# Patient Record
Sex: Female | Born: 1950 | Race: White | Hispanic: No | Smoking: Former smoker
Health system: Southern US, Community
[De-identification: ages and names within clinical notes are randomized; demographics above are authoritative.]

## PROBLEM LIST (undated history)

## (undated) DIAGNOSIS — C801 Malignant (primary) neoplasm, unspecified: Secondary | ICD-10-CM

## (undated) DIAGNOSIS — F329 Major depressive disorder, single episode, unspecified: Secondary | ICD-10-CM

## (undated) DIAGNOSIS — R000 Tachycardia, unspecified: Secondary | ICD-10-CM

## (undated) DIAGNOSIS — Z923 Personal history of irradiation: Secondary | ICD-10-CM

## (undated) DIAGNOSIS — E039 Hypothyroidism, unspecified: Secondary | ICD-10-CM

## (undated) DIAGNOSIS — E079 Disorder of thyroid, unspecified: Secondary | ICD-10-CM

## (undated) DIAGNOSIS — F419 Anxiety disorder, unspecified: Secondary | ICD-10-CM

## (undated) DIAGNOSIS — F32A Depression, unspecified: Secondary | ICD-10-CM

## (undated) DIAGNOSIS — Z9221 Personal history of antineoplastic chemotherapy: Secondary | ICD-10-CM

## (undated) DIAGNOSIS — I10 Essential (primary) hypertension: Secondary | ICD-10-CM

## (undated) HISTORY — PX: TONSILLECTOMY: SUR1361

## (undated) HISTORY — PX: BREAST LUMPECTOMY: SHX2

## (undated) HISTORY — DX: Disorder of thyroid, unspecified: E07.9

## (undated) HISTORY — PX: COLONOSCOPY: SHX174

---
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
## 1999-01-16 HISTORY — PX: BREAST EXCISIONAL BIOPSY: SUR124

## 2003-12-21 ENCOUNTER — Ambulatory Visit: Payer: Self-pay | Admitting: General Surgery

## 2004-02-22 ENCOUNTER — Emergency Department: Payer: Self-pay | Admitting: Emergency Medicine

## 2004-02-22 ENCOUNTER — Other Ambulatory Visit: Payer: Self-pay

## 2004-03-03 ENCOUNTER — Ambulatory Visit: Payer: Self-pay | Admitting: Internal Medicine

## 2006-04-16 ENCOUNTER — Other Ambulatory Visit: Payer: Self-pay

## 2006-04-16 ENCOUNTER — Emergency Department: Payer: Self-pay | Admitting: Emergency Medicine

## 2009-01-21 ENCOUNTER — Ambulatory Visit: Payer: Self-pay | Admitting: General Surgery

## 2010-04-10 ENCOUNTER — Emergency Department: Payer: Self-pay | Admitting: Emergency Medicine

## 2010-12-22 ENCOUNTER — Emergency Department: Payer: Self-pay | Admitting: Emergency Medicine

## 2010-12-22 ENCOUNTER — Ambulatory Visit: Payer: Self-pay

## 2011-02-11 ENCOUNTER — Emergency Department: Payer: Self-pay | Admitting: Unknown Physician Specialty

## 2011-02-11 LAB — COMPREHENSIVE METABOLIC PANEL
Albumin: 4.1 g/dL (ref 3.4–5.0)
Alkaline Phosphatase: 122 U/L (ref 50–136)
Anion Gap: 11 (ref 7–16)
BUN: 14 mg/dL (ref 7–18)
Bilirubin,Total: 0.5 mg/dL (ref 0.2–1.0)
Calcium, Total: 9 mg/dL (ref 8.5–10.1)
Chloride: 107 mmol/L (ref 98–107)
Co2: 24 mmol/L (ref 21–32)
Creatinine: 0.65 mg/dL (ref 0.60–1.30)
EGFR (African American): >60
EGFR (Non-African Amer.): >60
Glucose: 98 mg/dL (ref 65–99)
Osmolality: 284 (ref 275–301)
Potassium: 3.8 mmol/L (ref 3.5–5.1)
SGOT(AST): 27 U/L (ref 15–37)
SGPT (ALT): 36 U/L
Sodium: 142 mmol/L (ref 136–145)
Total Protein: 7 g/dL (ref 6.4–8.2)

## 2011-02-11 LAB — CK TOTAL AND CKMB (NOT AT ARMC)
CK, Total: 94 U/L (ref 21–215)
CK-MB: 1 ng/mL (ref 0.5–3.6)

## 2011-02-11 LAB — APTT: Activated PTT: 34.9 secs (ref 23.6–35.9)

## 2011-02-11 LAB — CBC
HCT: 40.9 % (ref 35.0–47.0)
HGB: 14.2 g/dL (ref 12.0–16.0)
MCH: 30.8 pg (ref 26.0–34.0)
MCHC: 34.7 g/dL (ref 32.0–36.0)
MCV: 89 fL (ref 80–100)
Platelet: 151 10*3/uL (ref 150–440)
RBC: 4.6 10*6/uL (ref 3.80–5.20)
RDW: 13.2 % (ref 11.5–14.5)
WBC: 6.3 10*3/uL (ref 3.6–11.0)

## 2011-02-11 LAB — PROTIME-INR
INR: 0.9
Prothrombin Time: 12.7 secs (ref 11.5–14.7)

## 2011-02-11 LAB — TROPONIN I: Troponin-I: <0.02 ng/mL

## 2015-01-11 ENCOUNTER — Emergency Department
Admission: EM | Admit: 2015-01-11 | Discharge: 2015-01-11 | Disposition: A | Discharge status: Home / Self Care | Payer: Medicare Other | Attending: Emergency Medicine | Admitting: Emergency Medicine

## 2015-01-11 DIAGNOSIS — L02212 Cutaneous abscess of back [any part, except buttock]: Secondary | ICD-10-CM | POA: Insufficient documentation

## 2015-01-11 DIAGNOSIS — R030 Elevated blood-pressure reading, without diagnosis of hypertension: Secondary | ICD-10-CM | POA: Diagnosis not present

## 2015-01-11 DIAGNOSIS — F17200 Nicotine dependence, unspecified, uncomplicated: Secondary | ICD-10-CM | POA: Diagnosis not present

## 2015-01-11 MED ORDER — HYDROCODONE-ACETAMINOPHEN 5-325 MG PO TABS: 1.0000 | ORAL_TABLET | ORAL | Status: DC | PRN

## 2015-01-11 MED ORDER — LIDOCAINE-EPINEPHRINE (PF) 1 %-1:200000 IJ SOLN
INTRAMUSCULAR | Status: AC
  Administered 2015-01-11: 20:00:00
  Filled 2015-01-11: qty 30

## 2015-01-11 MED ORDER — SULFAMETHOXAZOLE-TRIMETHOPRIM 800-160 MG PO TABS: 1.0000 | ORAL_TABLET | Freq: Two times a day (BID) | ORAL | Status: DC

## 2015-01-11 MED ORDER — LIDOCAINE-EPINEPHRINE (PF) 2 %-1:200000 IJ SOLN: 10.0000 mL | Freq: Once | INTRAMUSCULAR | Status: DC

## 2015-01-11 NOTE — Discharge Instructions (Signed)
Abscess °An abscess is an infected area that contains a collection of pus and debris. It can occur in almost any part of the body. An abscess is also known as a furuncle or boil. °CAUSES  °An abscess occurs when tissue gets infected. This can occur from blockage of oil or sweat glands, infection of hair follicles, or a minor injury to the skin. As the body tries to fight the infection, pus collects in the area and creates pressure under the skin. This pressure causes pain. People with weakened immune systems have difficulty fighting infections and get certain abscesses more often.  °SYMPTOMS °Usually an abscess develops on the skin and becomes a painful mass that is red, warm, and tender. If the abscess forms under the skin, you may feel a moveable soft area under the skin. Some abscesses break open (rupture) on their own, but most will continue to get worse without care. The infection can spread deeper into the body and eventually into the bloodstream, causing you to feel ill.  °DIAGNOSIS  °Your caregiver will take your medical history and perform a physical exam. A sample of fluid may also be taken from the abscess to determine what is causing your infection. °TREATMENT  °Your caregiver may prescribe antibiotic medicines to fight the infection. However, taking antibiotics alone usually does not cure an abscess. Your caregiver may need to make a small cut (incision) in the abscess to drain the pus. In some cases, gauze is packed into the abscess to reduce pain and to continue draining the area. °HOME CARE INSTRUCTIONS  °· Only take over-the-counter or prescription medicines for pain, discomfort, or fever as directed by your caregiver. °· If you were prescribed antibiotics, take them as directed. Finish them even if you start to feel better. °· If gauze is used, follow your caregiver's directions for changing the gauze. °· To avoid spreading the infection: °· Keep your draining abscess covered with a  bandage. °· Wash your hands well. °· Do not share personal care items, towels, or whirlpools with others. °· Avoid skin contact with others. °· Keep your skin and clothes clean around the abscess. °· Keep all follow-up appointments as directed by your caregiver. °SEEK MEDICAL CARE IF:  °· You have increased pain, swelling, redness, fluid drainage, or bleeding. °· You have muscle aches, chills, or a general ill feeling. °· You have a fever. °MAKE SURE YOU:  °· Understand these instructions. °· Will watch your condition. °· Will get help right away if you are not doing well or get worse. °  °This information is not intended to replace advice given to you by your health care provider. Make sure you discuss any questions you have with your health care provider. °  °Document Released: 10/11/2004 Document Revised: 07/03/2011 Document Reviewed: 03/16/2011 °Elsevier Interactive Patient Education ©2016 [employer] ° °Incision and Drainage °Incision and drainage is a procedure in which a sac-like structure (cystic structure) is opened and drained. The area to be drained usually contains material such as pus, fluid, or blood.  °LET YOUR CAREGIVER KNOW ABOUT:  °· Allergies to medicine. °· Medicines taken, including vitamins, herbs, eyedrops, over-the-counter medicines, and creams. °· Use of steroids (by mouth or creams). °· Previous problems with anesthetics or numbing medicines. °· History of bleeding problems or blood clots. °· Previous surgery. °· Other health problems, including diabetes and kidney problems. °· Possibility of pregnancy, if this applies. °RISKS AND COMPLICATIONS °· Pain. °· Bleeding. °· Scarring. °· Infection. °BEFORE THE PROCEDURE  °  You may need to have an ultrasound or other imaging tests to see how large or deep your cystic structure is. Blood tests may also be used to determine if you have an infection or how severe the infection is. You may need to have a tetanus shot. °PROCEDURE  °The affected area  is cleaned with a cleaning fluid. The cyst area will then be numbed with a medicine (local anesthetic). A small incision will be made in the cystic structure. A syringe or catheter may be used to drain the contents of the cystic structure, or the contents may be squeezed out. The area will then be flushed with a cleansing solution. After cleansing the area, it is often gently packed with a gauze or another wound dressing. Once it is packed, it will be covered with gauze and tape or some other type of wound dressing.  °AFTER THE PROCEDURE  °· Often, you will be allowed to go home right after the procedure. °· You may be given antibiotic medicine to prevent or heal an infection. °· If the area was packed with gauze or some other wound dressing, you will likely need to come back in 1 to 2 days to get it removed. °· The area should heal in about 14 days. °  °This information is not intended to replace advice given to you by your health care provider. Make sure you discuss any questions you have with your health care provider. °  °Document Released: 06/27/2000 Document Revised: 07/03/2011 Document Reviewed: 02/26/2011 °Elsevier Interactive Patient Education ©2016 [employer] ° °

## 2015-01-11 NOTE — ED Notes (Signed)
Pt states she went to [facility] for treatment of an abscess to the right upper back for the past week, drainage for the past 2 days.. Pt states they would not treat her there due to elevated b/p .Marland Kitchen Denies hx of HTN,, denies any neuro sx on arrival

## 2015-01-11 NOTE — ED Provider Notes (Signed)
[facility] Emergency Department Provider Note  ____________________________________________  Time seen: Approximately 7:40 PM  I have reviewed the triage vital signs and the nursing notes.   HISTORY  Chief Complaint Abscess    HPI Alicia [name] is a 64 y.o. female who presents to the emergency department for an abscess to her right upper back. She states that she noticed swelling and redness to area approximately a week ago. She does endorse some drainage from the area over the last 2 days. Patient initially presented to an urgent care to the emergency department due to an elevated blood pressure reading. Patient denies any history of blood pressure or heart problems. She denies any headache, visual acuity changes, difficulty breathing, chest pain, abdominal pain, nausea or vomiting.   History reviewed. No pertinent past medical history.  There are no active problems to display for this patient.   Past Surgical History  Procedure Laterality Date  . Tonsillectomy      Current Outpatient Rx  Name  Route  Sig  Dispense  Refill  . HYDROcodone-acetaminophen (NORCO/VICODIN) 5-325 MG tablet   Oral   Take 1 tablet by mouth every 4 (four) hours as needed for moderate pain.   20 tablet   0   . sulfamethoxazole-trimethoprim (BACTRIM DS,SEPTRA DS) 800-160 MG tablet   Oral   Take 1 tablet by mouth 2 (two) times daily.   20 tablet   0     Allergies Review of patient's allergies indicates no known allergies.  No family history on file.  Social History Social History  Substance Use Topics  . Smoking status: Current Some Day Smoker  . Smokeless tobacco: None  . Alcohol Use: No    Review of Systems Constitutional: No fever/chills Eyes: No visual changes. ENT: No sore throat. Cardiovascular: Denies chest pain. Respiratory: Denies shortness of breath. Gastrointestinal: No abdominal pain.  No nausea, no vomiting.  No diarrhea.  No  constipation. Genitourinary: Negative for dysuria. Musculoskeletal: Negative for back pain. Skin: Negative for rash. Endorses abscess to right upper back. Neurological: Negative for headaches, focal weakness or numbness.  10-point ROS otherwise negative.  ____________________________________________   PHYSICAL EXAM:  VITAL SIGNS: ED Triage Vitals  Enc Vitals Group     BP 01/11/15 1759 188/84 mmHg     Pulse Rate 01/11/15 1759 87     Resp 01/11/15 1759 18     Temp 01/11/15 1759 97.7 F (36.5 C)     Temp Source 01/11/15 1759 Oral     SpO2 01/11/15 1759 98 %     Weight 01/11/15 1759 215 lb (97.523 kg)     Height 01/11/15 1759 5\' 6"  (1.676 m)     Head Cir --      Peak Flow --      Pain Score 01/11/15 1800 4     Pain Loc --      Pain Edu? --      Excl. in [place]? --     Constitutional: Alert and oriented. Well appearing and in no acute distress. Eyes: Conjunctivae are normal. PERRL. EOMI. Head: Atraumatic. Nose: No congestion/rhinnorhea. Mouth/Throat: Mucous membranes are moist.  Oropharynx non-erythematous. Neck: No stridor.   Cardiovascular: Normal rate, regular rhythm. Grossly normal heart sounds.  Good peripheral circulation. Respiratory: Normal respiratory effort.  No retractions. Lungs CTAB. Gastrointestinal: Soft and nontender. No distention. No abdominal bruits. No CVA tenderness. Musculoskeletal: No lower extremity tenderness nor edema.  No joint effusions. Neurologic:  Normal speech and language. No gross  focal neurologic deficits are appreciated. No gait instability. Skin:  Skin is warm, dry and intact. No rash noted. Erythematous and edematous lesion is noted to the right upper back over the scapula. Abscess does have some minor central erosion with purulent drainage. Area is very tender to palpation. Diameter of the abscess is approximately 5 cm. Area is fluctuant to palpation. There is tender to palpation. Psychiatric: Mood and affect are normal. Speech and behavior  are normal.  ____________________________________________   LABS (all labs ordered are listed, but only abnormal results are displayed)  Labs Reviewed - No data to display ____________________________________________  EKG   ____________________________________________  RADIOLOGY   ____________________________________________   PROCEDURES  Procedure(s) performed: yes, absces, see procedure note(s).   INCISION AND DRAINAGE Performed by: Charline Bills Nehal Witting Consent: Verbal consent obtained. Risks and benefits: risks, benefits and alternatives were discussed Type: abscess  Body area: Right upper back  Anesthesia: local infiltration  Incision was made with a scalpel.  Local anesthetic: lidocaine 1 % with epinephrine  Anesthetic total: 5 ml  Complexity: complex Blunt dissection to break up loculations  Drainage: purulent  Drainage amount: 7 mL   Packing material: 1/4 in iodoform gauze  Patient tolerance: Patient tolerated the procedure well with no immediate complications.     Critical Care performed: No  ____________________________________________   INITIAL IMPRESSION / ASSESSMENT AND PLAN / ED COURSE  Pertinent labs & imaging results that were available during my care of the patient were reviewed by me and considered in my medical decision making (see chart for details).  Patient presents to the emergency department from urgent care for abscess to right upper back. Urgent care sent patient to the emergency department due to elevated blood pressure. Patient is exhibiting no signs of ICP, CVA, or cardiac issues. Patient does have a history of anxiety and endorses being anxious. Patient does endorse significant amount of pain to area. Elevated blood pressure is most likely due to his underlying factors. Area is incised and drained in the emergency department with significant purulent drainage. Wound is packed using quarter-inch iodoform gauze. Patient is  advised to follow-up in 2 days with primary care, urgent care, or this department for wound recheck. Patient will be placed on Bactrim for 10 days for antibiotic coverage. Patient is also given prescription for narcotics for pain control. Patient verbalizes understanding of diagnosis and treatment plan and verbalizes compliance with same.   Discharge Medication List as of 01/11/2015  8:22 PM    START taking these medications   Details  HYDROcodone-acetaminophen (NORCO/VICODIN) 5-325 MG tablet Take 1 tablet by mouth every 4 (four) hours as needed for moderate pain., Starting 01/11/2015, Until Discontinued, Print    sulfamethoxazole-trimethoprim (BACTRIM DS,SEPTRA DS) 800-160 MG tablet Take 1 tablet by mouth 2 (two) times daily., Starting 01/11/2015, Until Discontinued, Print        ____________________________________________   FINAL CLINICAL IMPRESSION(S) / ED DIAGNOSES  Final diagnoses:  Abscess of back      Darletta Moll, PA-C 01/11/15 2035  Hinda Kehr, MD 01/11/15 2111

## 2015-01-13 DIAGNOSIS — F410 Panic disorder [episodic paroxysmal anxiety] without agoraphobia: Secondary | ICD-10-CM | POA: Diagnosis not present

## 2015-01-13 DIAGNOSIS — L02212 Cutaneous abscess of back [any part, except buttock]: Secondary | ICD-10-CM | POA: Diagnosis not present

## 2015-01-18 DIAGNOSIS — L02212 Cutaneous abscess of back [any part, except buttock]: Secondary | ICD-10-CM | POA: Diagnosis not present

## 2015-01-18 DIAGNOSIS — I10 Essential (primary) hypertension: Secondary | ICD-10-CM | POA: Diagnosis not present

## 2015-01-21 DIAGNOSIS — I10 Essential (primary) hypertension: Secondary | ICD-10-CM | POA: Diagnosis not present

## 2015-01-21 DIAGNOSIS — L02212 Cutaneous abscess of back [any part, except buttock]: Secondary | ICD-10-CM | POA: Diagnosis not present

## 2015-01-27 DIAGNOSIS — L02212 Cutaneous abscess of back [any part, except buttock]: Secondary | ICD-10-CM | POA: Diagnosis not present

## 2015-01-27 DIAGNOSIS — F410 Panic disorder [episodic paroxysmal anxiety] without agoraphobia: Secondary | ICD-10-CM | POA: Diagnosis not present

## 2015-01-27 DIAGNOSIS — I10 Essential (primary) hypertension: Secondary | ICD-10-CM | POA: Diagnosis not present

## 2015-02-11 DIAGNOSIS — L089 Local infection of the skin and subcutaneous tissue, unspecified: Secondary | ICD-10-CM | POA: Diagnosis not present

## 2015-02-11 DIAGNOSIS — E784 Other hyperlipidemia: Secondary | ICD-10-CM | POA: Diagnosis not present

## 2015-02-11 DIAGNOSIS — I10 Essential (primary) hypertension: Secondary | ICD-10-CM | POA: Diagnosis not present

## 2015-02-11 DIAGNOSIS — L729 Follicular cyst of the skin and subcutaneous tissue, unspecified: Secondary | ICD-10-CM | POA: Diagnosis not present

## 2015-02-17 ENCOUNTER — Emergency Department: Payer: Medicare Other

## 2015-02-17 ENCOUNTER — Encounter: Payer: Self-pay | Admitting: *Deleted

## 2015-02-17 ENCOUNTER — Observation Stay
Admission: EM | Admit: 2015-02-17 | Discharge: 2015-02-18 | Disposition: A | Discharge status: Home / Self Care | Payer: Medicare Other | Attending: Specialist | Admitting: Specialist

## 2015-02-17 DIAGNOSIS — I10 Essential (primary) hypertension: Secondary | ICD-10-CM | POA: Diagnosis not present

## 2015-02-17 DIAGNOSIS — R7989 Other specified abnormal findings of blood chemistry: Secondary | ICD-10-CM

## 2015-02-17 DIAGNOSIS — R42 Dizziness and giddiness: Secondary | ICD-10-CM | POA: Diagnosis not present

## 2015-02-17 DIAGNOSIS — R079 Chest pain, unspecified: Principal | ICD-10-CM | POA: Insufficient documentation

## 2015-02-17 DIAGNOSIS — Z9889 Other specified postprocedural states: Secondary | ICD-10-CM | POA: Diagnosis not present

## 2015-02-17 DIAGNOSIS — Z823 Family history of stroke: Secondary | ICD-10-CM | POA: Diagnosis not present

## 2015-02-17 DIAGNOSIS — Z7982 Long term (current) use of aspirin: Secondary | ICD-10-CM | POA: Diagnosis not present

## 2015-02-17 DIAGNOSIS — F419 Anxiety disorder, unspecified: Secondary | ICD-10-CM | POA: Insufficient documentation

## 2015-02-17 DIAGNOSIS — Z720 Tobacco use: Secondary | ICD-10-CM | POA: Diagnosis not present

## 2015-02-17 DIAGNOSIS — Z8249 Family history of ischemic heart disease and other diseases of the circulatory system: Secondary | ICD-10-CM | POA: Diagnosis not present

## 2015-02-17 DIAGNOSIS — R778 Other specified abnormalities of plasma proteins: Secondary | ICD-10-CM | POA: Diagnosis present

## 2015-02-17 DIAGNOSIS — F17200 Nicotine dependence, unspecified, uncomplicated: Secondary | ICD-10-CM | POA: Insufficient documentation

## 2015-02-17 DIAGNOSIS — R0789 Other chest pain: Secondary | ICD-10-CM | POA: Diagnosis not present

## 2015-02-17 DIAGNOSIS — Z79891 Long term (current) use of opiate analgesic: Secondary | ICD-10-CM | POA: Diagnosis not present

## 2015-02-17 DIAGNOSIS — R748 Abnormal levels of other serum enzymes: Secondary | ICD-10-CM | POA: Diagnosis not present

## 2015-02-17 DIAGNOSIS — R200 Anesthesia of skin: Secondary | ICD-10-CM | POA: Diagnosis not present

## 2015-02-17 DIAGNOSIS — Z79899 Other long term (current) drug therapy: Secondary | ICD-10-CM | POA: Diagnosis not present

## 2015-02-17 DIAGNOSIS — E876 Hypokalemia: Secondary | ICD-10-CM | POA: Diagnosis not present

## 2015-02-17 HISTORY — DX: Essential (primary) hypertension: I10

## 2015-02-17 HISTORY — DX: Other specified abnormal findings of blood chemistry: R79.89

## 2015-02-17 HISTORY — DX: Anxiety disorder, unspecified: F41.9

## 2015-02-17 LAB — BASIC METABOLIC PANEL
Anion gap: 9 (ref 5–15)
BUN: 11 mg/dL (ref 6–20)
CO2: 25 mmol/L (ref 22–32)
Calcium: 9.5 mg/dL (ref 8.9–10.3)
Chloride: 104 mmol/L (ref 101–111)
Creatinine, Ser: 0.92 mg/dL (ref 0.44–1.00)
GFR calc Af Amer: >60 mL/min (ref >60)
GFR calc non Af Amer: >60 mL/min (ref >60)
Glucose, Bld: 117 mg/dL — ABNORMAL HIGH (ref 65–99)
Potassium: 2.8 mmol/L — CL (ref 3.5–5.1)
Sodium: 138 mmol/L (ref 135–145)

## 2015-02-17 LAB — TROPONIN I
Troponin I: 0.05 ng/mL — ABNORMAL HIGH (ref <0.031)
Troponin I: <0.03 ng/mL (ref <0.031)

## 2015-02-17 LAB — CBC
HCT: 40.2 % (ref 35.0–47.0)
Hemoglobin: 13.4 g/dL (ref 12.0–16.0)
MCH: 28.9 pg (ref 26.0–34.0)
MCHC: 33.4 g/dL (ref 32.0–36.0)
MCV: 86.5 fL (ref 80.0–100.0)
Platelets: 203 10*3/uL (ref 150–440)
RBC: 4.64 MIL/uL (ref 3.80–5.20)
RDW: 13.7 % (ref 11.5–14.5)
WBC: 7.7 10*3/uL (ref 3.6–11.0)

## 2015-02-17 LAB — MAGNESIUM: Magnesium: 2 mg/dL (ref 1.7–2.4)

## 2015-02-17 MED ORDER — HEPARIN SODIUM (PORCINE) 5000 UNIT/ML IJ SOLN
5000.0000 [IU] | Freq: Three times a day (TID) | INTRAMUSCULAR | Status: DC
Start: 2015-02-17 — End: 2015-02-18
  Administered 2015-02-17 – 2015-02-18 (×2): 5000 [IU] via SUBCUTANEOUS
  Filled 2015-02-17 (×2): qty 1

## 2015-02-17 MED ORDER — POTASSIUM CHLORIDE CRYS ER 20 MEQ PO TBCR
40.0000 meq | EXTENDED_RELEASE_TABLET | Freq: Once | ORAL | Status: AC
  Administered 2015-02-17: 40 meq via ORAL
  Filled 2015-02-17: qty 2

## 2015-02-17 MED ORDER — METOPROLOL TARTRATE 25 MG PO TABS
25.0000 mg | ORAL_TABLET | Freq: Two times a day (BID) | ORAL | Status: DC
  Administered 2015-02-17: 25 mg via ORAL
  Filled 2015-02-17: qty 1

## 2015-02-17 MED ORDER — OXYCODONE HCL 5 MG PO TABS: 5.0000 mg | ORAL_TABLET | ORAL | Status: DC | PRN

## 2015-02-17 MED ORDER — ATORVASTATIN CALCIUM 20 MG PO TABS: 40.0000 mg | ORAL_TABLET | Freq: Every day | ORAL | Status: DC

## 2015-02-17 MED ORDER — ASPIRIN EC 325 MG PO TBEC
325.0000 mg | DELAYED_RELEASE_TABLET | Freq: Every day | ORAL | Status: DC
  Administered 2015-02-18: 325 mg via ORAL
  Filled 2015-02-17: qty 1

## 2015-02-17 MED ORDER — ONDANSETRON HCL 4 MG/2ML IJ SOLN
4.0000 mg | Freq: Four times a day (QID) | INTRAMUSCULAR | Status: DC | PRN
Start: 2015-02-17 — End: 2015-02-18

## 2015-02-17 MED ORDER — CLOPIDOGREL BISULFATE 75 MG PO TABS
300.0000 mg | ORAL_TABLET | Freq: Once | ORAL | Status: AC
  Administered 2015-02-17: 300 mg via ORAL
  Filled 2015-02-17: qty 4

## 2015-02-17 MED ORDER — MORPHINE SULFATE (PF) 4 MG/ML IV SOLN: 4.0000 mg | INTRAVENOUS | Status: DC | PRN

## 2015-02-17 MED ORDER — ASPIRIN 81 MG PO CHEW
243.0000 mg | CHEWABLE_TABLET | Freq: Once | ORAL | Status: AC
  Administered 2015-02-17: 162 mg via ORAL
  Filled 2015-02-17: qty 2

## 2015-02-17 MED ORDER — POTASSIUM CHLORIDE CRYS ER 20 MEQ PO TBCR
40.0000 meq | EXTENDED_RELEASE_TABLET | Freq: Once | ORAL | Status: AC
Start: 2015-02-17 — End: 2015-02-17
  Administered 2015-02-17: 40 meq via ORAL
  Filled 2015-02-17: qty 2

## 2015-02-17 MED ORDER — ALBUTEROL SULFATE (2.5 MG/3ML) 0.083% IN NEBU: 2.5000 mg | INHALATION_SOLUTION | RESPIRATORY_TRACT | Status: DC | PRN

## 2015-02-17 MED ORDER — CLOPIDOGREL BISULFATE 75 MG PO TABS: 75.0000 mg | ORAL_TABLET | Freq: Every day | ORAL | Status: DC

## 2015-02-17 MED ORDER — ALPRAZOLAM 0.25 MG PO TABS
0.2500 mg | ORAL_TABLET | Freq: Every day | ORAL | Status: DC
  Administered 2015-02-17: 0.25 mg via ORAL
  Filled 2015-02-17: qty 1

## 2015-02-17 MED ORDER — NITROGLYCERIN 0.4 MG SL SUBL: 0.4000 mg | SUBLINGUAL_TABLET | SUBLINGUAL | Status: DC | PRN

## 2015-02-17 MED ORDER — LOSARTAN POTASSIUM 50 MG PO TABS
50.0000 mg | ORAL_TABLET | Freq: Every day | ORAL | Status: DC
  Administered 2015-02-18: 50 mg via ORAL
  Filled 2015-02-17: qty 1

## 2015-02-17 MED ORDER — SODIUM CHLORIDE 0.9% FLUSH
3.0000 mL | Freq: Two times a day (BID) | INTRAVENOUS | Status: DC
  Administered 2015-02-17 – 2015-02-18 (×2): 3 mL via INTRAVENOUS

## 2015-02-17 MED ORDER — ACETAMINOPHEN 325 MG PO TABS: 650.0000 mg | ORAL_TABLET | Freq: Four times a day (QID) | ORAL | Status: DC | PRN

## 2015-02-17 MED ORDER — ONDANSETRON HCL 4 MG PO TABS: 4.0000 mg | ORAL_TABLET | Freq: Four times a day (QID) | ORAL | Status: DC | PRN

## 2015-02-17 MED ORDER — ACETAMINOPHEN 650 MG RE SUPP: 650.0000 mg | Freq: Four times a day (QID) | RECTAL | Status: DC | PRN

## 2015-02-17 NOTE — ED Notes (Addendum)
Pt states chest pain, feels like her heart is racing, states some nausea and left arm numbness and cold, pt states hx of anxiety, awake and alert in no acute distress

## 2015-02-17 NOTE — ED Provider Notes (Signed)
[facility] Emergency Department Provider Note   ____________________________________________  Time seen: I have reviewed the triage vital signs and the triage nursing note.  HISTORY  Chief Complaint Chest Pain   Historian Patient, family members  HPI Alicia [name] is a 65 y.o. female with a history of hypertension who has seen Dr.Khan in the past for hypertension, is here with episodes of chest pain over the past 2-3 days. She will have episodes that last for a few minutes where she has central lower chest pain that radiates up to her left neck and down to her left arm. She has had some hyperventilation/anxiety. She's had nausea without vomiting. No abdominal pain or diarrhea. She did lose her boyfriend to cancer this week. Symptoms are moderate. Not necessary worse with exertion.    Past Medical History  Diagnosis Date  . Hypertension     There are no active problems to display for this patient.   Past Surgical History  Procedure Laterality Date  . Tonsillectomy      Current Outpatient Rx  Name  Route  Sig  Dispense  Refill  . ALPRAZolam (XANAX) 0.25 MG tablet   Oral   Take 0.25 mg by mouth at bedtime.         Marland Kitchen aspirin EC 81 MG tablet   Oral   Take 81 mg by mouth daily.         Marland Kitchen losartan-hydrochlorothiazide (HYZAAR) 50-12.5 MG tablet   Oral   Take 0.5 tablets by mouth daily.         Marland Kitchen HYDROcodone-acetaminophen (NORCO/VICODIN) 5-325 MG tablet   Oral   Take 1 tablet by mouth every 4 (four) hours as needed for moderate pain. Patient not taking: Reported on 02/17/2015   20 tablet   0   . sulfamethoxazole-trimethoprim (BACTRIM DS,SEPTRA DS) 800-160 MG tablet   Oral   Take 1 tablet by mouth 2 (two) times daily. Patient not taking: Reported on 02/17/2015   20 tablet   0     Allergies Review of patient's allergies indicates no known allergies.  History reviewed. No pertinent family history.  Social History Social History   Substance Use Topics  . Smoking status: Current Some Day Smoker  . Smokeless tobacco: None  . Alcohol Use: No    Review of Systems  Constitutional: Negative for fever. Eyes: Negative for visual changes. ENT: Negative for sore throat. Cardiovascular: Positive for chest pain. Respiratory: Positive for shortness of breath, at times but no pleuritic chest pain. No coughing. Gastrointestinal: Negative for abdominal pain, vomiting and diarrhea. Genitourinary: Negative for dysuria. Musculoskeletal: Negative for back pain. Skin: Negative for rash. Neurological: Negative for headache. 10 point Review of Systems otherwise negative ____________________________________________   PHYSICAL EXAM:  VITAL SIGNS: ED Triage Vitals  Enc Vitals Group     BP 02/17/15 1745 170/75 mmHg     Pulse Rate 02/17/15 1745 88     Resp 02/17/15 1745 18     Temp 02/17/15 1745 97.6 F (36.4 C)     Temp Source 02/17/15 1745 Oral     SpO2 02/17/15 1745 97 %     Weight 02/17/15 1745 205 lb (92.987 kg)     Height 02/17/15 1745 5\' 6"  (1.676 m)     Head Cir --      Peak Flow --      Pain Score 02/17/15 1745 6     Pain Loc --      Pain Edu? --  Excl. in [place]? --      Constitutional: Alert and oriented. Well appearing and in no distress. She is anxious. Eyes: Conjunctivae are normal. PERRL. Normal extraocular movements. ENT   Head: Normocephalic and atraumatic.   Nose: No congestion/rhinnorhea.   Mouth/Throat: Mucous membranes are moist.   Neck: No stridor. Cardiovascular/Chest: Normal rate, regular rhythm.  No murmurs, rubs, or gallops. Respiratory: Normal respiratory effort without tachypnea nor retractions. Breath sounds are clear and equal bilaterally. No wheezes/rales/rhonchi. Gastrointestinal: Soft. No distention, no guarding, no rebound. Nontender.    Genitourinary/rectal:Deferred Musculoskeletal: Nontender with normal range of motion in all extremities. No joint effusions.  No  lower extremity tenderness.  No edema. Neurologic:  Normal speech and language. No gross or focal neurologic deficits are appreciated. Skin:  Skin is warm, dry and intact. No rash noted. Psychiatric: Mood and affect are normal. She is somewhat anxious though. Speech and behavior are normal. Patient exhibits appropriate insight and judgment.  ____________________________________________   EKG I, Lisa Roca, MD, the attending physician have personally viewed and interpreted all ECGs.  93 bpm. Normal sinus rhythm. Narrow QRS. Normal axis. Nonspecific minor ST depression inferiorly and laterally. ____________________________________________  LABS (pertinent positives/negatives)  Basic metabolic panel significant for potassium 2.8 CBC within normal limits Troponin 0.05  ____________________________________________  RADIOLOGY All Xrays were viewed by me. Imaging interpreted by Radiologist.  Chest 2 view: No acute cardio pulmonary disease. __________________________________________  PROCEDURES  Procedure(s) performed: None  Critical Care performed: None  ____________________________________________   ED COURSE / ASSESSMENT AND PLAN  Pertinent labs & imaging results that were available during my care of the patient were reviewed by me and considered in my medical decision making (see chart for details).   Patient is here with nonspecific chest pain, with some occasional associated symptoms. Not exertional, but she does have risk factor of hypertension. I think it is most likely that she is having a panic attack/anxiety/grief reaction.  Patient was given 3 baby aspirin as she took one at home already.  She will need cardiac evaluation.  I will observe for chest pain observation overnight.    CONSULTATIONS:   Hospitalist for admission.   Patient / Family / Caregiver informed of clinical course, medical decision-making process, and agree with  plan.     ___________________________________________   FINAL CLINICAL IMPRESSION(S) / ED DIAGNOSES   Final diagnoses:  Chest pain, unspecified chest pain type              Note: This dictation was prepared with Dragon dictation. Any transcriptional errors that result from this process are unintentional   Lisa Roca, MD 02/17/15 2028

## 2015-02-17 NOTE — H&P (Signed)
[facility] NAME: Alicia [name]    MR#:  [id number]  DATE OF BIRTH:  August 04, 1950  DATE OF ADMISSION:  02/17/2015  PRIMARY CARE PHYSICIAN: [name], MD   REQUESTING/REFERRING PHYSICIAN: Dr. Reita Cliche  CHIEF COMPLAINT:   Chief Complaint  Patient presents with  . Chest Pain   left sided chest pain for 2-3 days.  HISTORY OF PRESENT ILLNESS:  Alicia [name]  is a 65 y.o. female with a known history of hypertensio and anxiety. The patient has had the chest pain on the left side for the past 2-3 days. The chest pain is on the left side, intermittent and sharp, radiation to the left arm and shoulder. The patient also complains of nausea without vomiting. She denies any cough or shortness breath or palpitation or leg edema. She lost her boyfriend due to cancer this week.  PAST MEDICAL HISTORY:   Past Medical History  Diagnosis Date  . Hypertension    Anxiety PAST SURGICAL HISTORY:   Past Surgical History  Procedure Laterality Date  . Tonsillectomy      SOCIAL HISTORY:   Social History  Substance Use Topics  . Smoking status: Current Some Day Smoker  . Smokeless tobacco: Not on file  . Alcohol Use: No    FAMILY HISTORY:  History reviewed. No pertinent family history. father had heart attack and stroke, mother had  stroke. Both had hypertension.  DRUG ALLERGIES:  No Known Allergies  REVIEW OF SYSTEMS:  CONSTITUTIONAL: No fever, fatigue or weakness.  EYES: No blurred or double vision.  EARS, NOSE, AND THROAT: No tinnitus or ear pain.  RESPIRATORY: No cough, shortness of breath, wheezing or hemoptysis.  CARDIOVASCULAR: Has chest pain, no orthopnea, edema.  GASTROINTESTINAL: has nausea, no vomiting, diarrhea or abdominal pain.  GENITOURINARY: No dysuria, hematuria.  ENDOCRINE: No polyuria, nocturia,  HEMATOLOGY: No anemia, easy bruising or bleeding SKIN: No rash or lesion. MUSCULOSKELETAL: No joint pain or  arthritis.   NEUROLOGIC: No tingling, numbness, weakness.  PSYCHIATRY: No anxiety or depression.   MEDICATIONS AT HOME:   Prior to Admission medications   Medication Sig Start Date End Date Taking? Authorizing Provider  ALPRAZolam (XANAX) 0.25 MG tablet Take 0.25 mg by mouth at bedtime.   Yes Historical Provider, MD  aspirin EC 81 MG tablet Take 81 mg by mouth daily.   Yes Historical Provider, MD  losartan-hydrochlorothiazide (HYZAAR) 50-12.5 MG tablet Take 0.5 tablets by mouth daily.   Yes Historical Provider, MD  HYDROcodone-acetaminophen (NORCO/VICODIN) 5-325 MG tablet Take 1 tablet by mouth every 4 (four) hours as needed for moderate pain. Patient not taking: Reported on 02/17/2015 01/11/15   Charline Bills Cuthriell, PA-C  sulfamethoxazole-trimethoprim (BACTRIM DS,SEPTRA DS) 800-160 MG tablet Take 1 tablet by mouth 2 (two) times daily. Patient not taking: Reported on 02/17/2015 01/11/15   Charline Bills Cuthriell, PA-C      VITAL SIGNS:  Blood pressure 127/84, pulse 63, temperature 97.6 F (36.4 C), temperature source Oral, resp. rate 11, height 5\' 6"  (1.676 m), weight 92.987 kg (205 lb), SpO2 98 %.  PHYSICAL EXAMINATION:  GENERAL:  65 y.o.-year-old patient lying in the bed with no acute distress.  EYES: Pupils equal, round, reactive to light and accommodation. No scleral icterus. Extraocular muscles intact.  HEENT: Head atraumatic, normocephalic. Oropharynx and nasopharynx clear. Moist oral mucosa. NECK:  Supple, no jugular venous distention. No thyroid enlargement, no tenderness.  LUNGS: Normal breath sounds bilaterally, no wheezing, rales,rhonchi or crepitation. No  use of accessory muscles of respiration.  CARDIOVASCULAR: S1, S2 normal. No murmurs, rubs, or gallops.  ABDOMEN: Soft, nontender, nondistended. Bowel sounds present. No organomegaly or mass.  EXTREMITIES: No pedal edema, cyanosis, or clubbing.  NEUROLOGIC: Cranial nerves II through XII are intact. Muscle strength 5/5 in all  extremities. Sensation intact. Gait not checked.  PSYCHIATRIC: The patient is alert and oriented x 3.  SKIN: No obvious rash, lesion, or ulcer.   LABORATORY PANEL:   CBC  Recent Labs Lab 02/17/15 1747  WBC 7.7  HGB 13.4  HCT 40.2  PLT 203   ------------------------------------------------------------------------------------------------------------------  Chemistries   Recent Labs Lab 02/17/15 1747  NA 138  K 2.8*  CL 104  CO2 25  GLUCOSE 117*  BUN 11  CREATININE 0.92  CALCIUM 9.5   ------------------------------------------------------------------------------------------------------------------  Cardiac Enzymes  Recent Labs Lab 02/17/15 1747  TROPONINI 0.05*   ------------------------------------------------------------------------------------------------------------------  RADIOLOGY:  Dg Chest 2 View  02/17/2015  CLINICAL DATA:  65 year old presenting with 2 day history of left body numbness and acute onset of dizziness and chest pain which began earlier today. Stress due to the death of her fiance this week. Current history of hypertension. EXAM: CHEST  2 VIEW COMPARISON:  02/11/2011 and earlier. FINDINGS: Cardiac silhouette normal in size, unchanged. Thoracic aorta mildly tortuous and atherosclerotic, unchanged. Hilar and mediastinal contours otherwise unremarkable. Calcified granuloma involving the right lower lobe versus dense focal calcification in the costal cartilage of the anterior right sixth rib, unchanged. Lungs otherwise clear. No localized airspace consolidation. No pleural effusions. No pneumothorax. Normal pulmonary vascularity. Mild degenerative changes involving the thoracic spine. IMPRESSION: No acute cardiopulmonary disease. Electronically Signed   By: Evangeline Dakin M.D.   On: 02/17/2015 18:30    EKG:   Orders placed or performed during the hospital encounter of 02/17/15  . EKG 12-Lead  . EKG 12-Lead  . ED EKG within 10 minutes  . ED EKG  within 10 minutes    IMPRESSION AND PLAN:   Chest pain with elevated troponin The patient will be placed for observation. Continue telemetry monitor, repeat troponin level, follow-up lipid panel and cardiology consult from [facility]. The patient was given aspirin 325 mg 1 dose in the ED. I will give Plavix 300 mg 1 dose today and 75 mg by mouth daily from tomorrow. Start Lipitor.  Hypokalemia, give potassium supplement, follow-up potassium level and magnesium level. Hold HCTZ. Hypertension. Continue losartan and his Lopressor, discontinue HCTZ due to hypokalemia. Anxiety. Continue Xanax Tobacco abuse. Smoking status was counseled for 3 minutes.   All the records are reviewed and case discussed with ED provider. Management plans discussed with the patient,  sister and granddaughter and they are in agreement.  CODE STATUS: Full code  TOTAL TIME TAKING CARE OF THIS PATIENT: 50 minutes.    Demetrios Loll M.D on 02/17/2015 at 8:50 PM  Between 7am to 6pm - Pager - [phone]  After 6pm go to [email or web address] - password [email or web address] [facility]  Office  [phone]  CC: Primary care physician; [name], MD

## 2015-02-17 NOTE — ED Notes (Signed)
Attempted to call report, RN tied up at this time. Left extension number and RN will call ASAP.

## 2015-02-18 ENCOUNTER — Encounter: Payer: Self-pay | Admitting: Radiology

## 2015-02-18 ENCOUNTER — Encounter: Payer: Medicare Other | Attending: Cardiology

## 2015-02-18 DIAGNOSIS — I5189 Other ill-defined heart diseases: Secondary | ICD-10-CM | POA: Diagnosis not present

## 2015-02-18 DIAGNOSIS — R079 Chest pain, unspecified: Secondary | ICD-10-CM | POA: Insufficient documentation

## 2015-02-18 DIAGNOSIS — R7989 Other specified abnormal findings of blood chemistry: Secondary | ICD-10-CM | POA: Diagnosis not present

## 2015-02-18 DIAGNOSIS — E871 Hypo-osmolality and hyponatremia: Secondary | ICD-10-CM | POA: Diagnosis not present

## 2015-02-18 DIAGNOSIS — F419 Anxiety disorder, unspecified: Secondary | ICD-10-CM | POA: Diagnosis not present

## 2015-02-18 LAB — BASIC METABOLIC PANEL
Anion gap: 7 (ref 5–15)
BUN: 14 mg/dL (ref 6–20)
CO2: 24 mmol/L (ref 22–32)
Calcium: 8.9 mg/dL (ref 8.9–10.3)
Chloride: 110 mmol/L (ref 101–111)
Creatinine, Ser: 0.78 mg/dL (ref 0.44–1.00)
GFR calc Af Amer: >60 mL/min (ref >60)
GFR calc non Af Amer: >60 mL/min (ref >60)
Glucose, Bld: 100 mg/dL — ABNORMAL HIGH (ref 65–99)
Potassium: 4.4 mmol/L (ref 3.5–5.1)
Sodium: 141 mmol/L (ref 135–145)

## 2015-02-18 LAB — NM MYOCAR MULTI W/SPECT W/WALL MOTION / EF
LV dias vol: 79 mL
LV sys vol: 42 mL
SDS: 2
SRS: 0
SSS: 2
TID: 0.95

## 2015-02-18 LAB — LIPID PANEL
Cholesterol: 151 mg/dL (ref 0–200)
HDL: 65 mg/dL (ref >40)
LDL Cholesterol: 76 mg/dL (ref 0–99)
Total CHOL/HDL Ratio: 2.3 RATIO
Triglycerides: 49 mg/dL (ref <150)
VLDL: 10 mg/dL (ref 0–40)

## 2015-02-18 LAB — HEMOGLOBIN A1C: Hgb A1c MFr Bld: 5.6 % (ref 4.0–6.0)

## 2015-02-18 LAB — TROPONIN I: Troponin I: <0.03 ng/mL (ref <0.031)

## 2015-02-18 MED ORDER — TECHNETIUM TC 99M SESTAMIBI - CARDIOLITE
29.5040 | Freq: Once | INTRAVENOUS | Status: AC | PRN
  Administered 2015-02-18: 29.504 via INTRAVENOUS

## 2015-02-18 MED ORDER — TECHNETIUM TC 99M SESTAMIBI - CARDIOLITE
13.8170 | Freq: Once | INTRAVENOUS | Status: AC | PRN
  Administered 2015-02-18: 13.817 via INTRAVENOUS

## 2015-02-18 MED ORDER — ISOSORBIDE MONONITRATE ER 30 MG PO TB24: 30.0000 mg | ORAL_TABLET | Freq: Every day | ORAL | Status: DC

## 2015-02-18 MED ORDER — CLOPIDOGREL BISULFATE 75 MG PO TABS: 75.0000 mg | ORAL_TABLET | Freq: Every day | ORAL | Status: DC

## 2015-02-18 MED ORDER — REGADENOSON 0.4 MG/5ML IV SOLN
0.4000 mg | Freq: Once | INTRAVENOUS | Status: AC
  Administered 2015-02-18: 0.4 mg via INTRAVENOUS

## 2015-02-18 NOTE — Consult Note (Signed)
Alicia [name] is a 65 y.o. female  [id number]  Primary Cardiologist: Neoma Laming Reason for Consultation: Chest pain, elevated troponin  HPI: Alicia [name] developed chest pain last evening while trying to eat at a restaurant. This is decribed as sharp, between her breasts, radiating as a numbness going down her left arm intermittent last a few minutes at a time and accompanied by nausea. She has never felt this discomfort before. She was found to be hypokalemic in the ED. She is also experiencing a great deal of stress after having just lost her fiance to illness. She has a remote history of panic attacks and has a family history of heart disease in father starting at the age of 11 and in brother.  Review of Systems:  Neuro: she has had some headaches and was told that she was not drinking enough water so for the last few days she has been pushing fluids, otherwise negative for dizziness Cardiovascular ROS: Chest pain as described, none at present, no edema, no palpitations  Resp: no shortness of breath or cough   Past Medical History  Diagnosis Date  . Hypertension   . Anxiety     Medications Prior to Admission  Medication Sig Dispense Refill  . ALPRAZolam (XANAX) 0.25 MG tablet Take 0.25 mg by mouth at bedtime.    Marland Kitchen aspirin EC 81 MG tablet Take 81 mg by mouth daily.    Marland Kitchen losartan-hydrochlorothiazide (HYZAAR) 50-12.5 MG tablet Take 0.5 tablets by mouth daily.    Marland Kitchen HYDROcodone-acetaminophen (NORCO/VICODIN) 5-325 MG tablet Take 1 tablet by mouth every 4 (four) hours as needed for moderate pain. (Patient not taking: Reported on 02/17/2015) 20 tablet 0  . sulfamethoxazole-trimethoprim (BACTRIM DS,SEPTRA DS) 800-160 MG tablet Take 1 tablet by mouth 2 (two) times daily. (Patient not taking: Reported on 02/17/2015) 20 tablet 0     . ALPRAZolam  0.25 mg Oral QHS  . aspirin EC  325 mg Oral Daily  . atorvastatin  40 mg Oral q1800  . clopidogrel  75 mg Oral Daily  . heparin  5,000 Units  Subcutaneous 3 times per day  . losartan  50 mg Oral Daily  . metoprolol tartrate  25 mg Oral BID  . sodium chloride flush  3 mL Intravenous Q12H    Infusions:    No Known Allergies  Social History   Social History  . Marital Status: Widowed    Spouse Name: N/A  . Number of Children: N/A  . Years of Education: N/A   Occupational History  . Not on file.   Social History Main Topics  . Smoking status: Current Some Day Smoker  . Smokeless tobacco: Not on file  . Alcohol Use: No  . Drug Use: Not on file  . Sexual Activity: Not on file   Other Topics Concern  . Not on file   Social History Narrative    Family History  Problem Relation Age of Onset  . Stroke Mother   . Stroke Father   . Heart attack Father     PHYSICAL EXAM: Filed Vitals:   02/17/15 2238 02/18/15 0505  BP: 128/66 122/66  Pulse: 79 61  Temp: 97.5 F (36.4 C) 97.9 F (36.6 C)  Resp: 21 21    No intake or output data in the 24 hours ending 02/18/15 0836  General:  Well appearing. No respiratory difficulty HEENT: normal Neck: supple. no JVD. Carotids 2+ bilat; no bruits. No lymphadenopathy or thryomegaly appreciated. Cor: PMI nondisplaced.  Regular rate & rhythm. No rubs, gallops or murmurs. Lungs: clear Abdomen: soft, nontender, nondistended. No hepatosplenomegaly. No bruits or masses. Good bowel sounds. Extremities: no cyanosis, clubbing, rash, edema Neuro: alert & oriented x 3, cranial nerves grossly intact. moves all 4 extremities w/o difficulty. Affect pleasant.  ECG:  SR 93 bpm, non-specific ST changes, prolonged QTC .504 - done during hypokalemia  Results for orders placed or performed during the hospital encounter of 02/17/15 (from the past 24 hour(s))  Basic metabolic panel     Status: Abnormal   Collection Time: 02/17/15  5:47 PM  Result Value Ref Range   Sodium 138 135 - 145 mmol/L   Potassium 2.8 (LL) 3.5 - 5.1 mmol/L   Chloride 104 101 - 111 mmol/L   CO2 25 22 - 32 mmol/L    Glucose, Bld 117 (H) 65 - 99 mg/dL   BUN 11 6 - 20 mg/dL   Creatinine, Ser 0.92 0.44 - 1.00 mg/dL   Calcium 9.5 8.9 - 10.3 mg/dL   GFR calc non Af Amer >60 >60 mL/min   GFR calc Af Amer >60 >60 mL/min   Anion gap 9 5 - 15  CBC     Status: None   Collection Time: 02/17/15  5:47 PM  Result Value Ref Range   WBC 7.7 3.6 - 11.0 K/uL   RBC 4.64 3.80 - 5.20 MIL/uL   Hemoglobin 13.4 12.0 - 16.0 g/dL   HCT 40.2 35.0 - 47.0 %   MCV 86.5 80.0 - 100.0 fL   MCH 28.9 26.0 - 34.0 pg   MCHC 33.4 32.0 - 36.0 g/dL   RDW 13.7 11.5 - 14.5 %   Platelets 203 150 - 440 K/uL  Troponin I     Status: Abnormal   Collection Time: 02/17/15  5:47 PM  Result Value Ref Range   Troponin I 0.05 (H) <0.031 ng/mL  Magnesium     Status: None   Collection Time: 02/17/15  5:47 PM  Result Value Ref Range   Magnesium 2.0 1.7 - 2.4 mg/dL  Troponin I     Status: None   Collection Time: 02/17/15 10:13 PM  Result Value Ref Range   Troponin I <0.03 <0.031 ng/mL  Troponin I     Status: None   Collection Time: 02/18/15  4:15 AM  Result Value Ref Range   Troponin I <0.03 <0.031 ng/mL  Basic metabolic panel     Status: Abnormal   Collection Time: 02/18/15  4:15 AM  Result Value Ref Range   Sodium 141 135 - 145 mmol/L   Potassium 4.4 3.5 - 5.1 mmol/L   Chloride 110 101 - 111 mmol/L   CO2 24 22 - 32 mmol/L   Glucose, Bld 100 (H) 65 - 99 mg/dL   BUN 14 6 - 20 mg/dL   Creatinine, Ser 0.78 0.44 - 1.00 mg/dL   Calcium 8.9 8.9 - 10.3 mg/dL   GFR calc non Af Amer >60 >60 mL/min   GFR calc Af Amer >60 >60 mL/min   Anion gap 7 5 - 15  Lipid panel     Status: None   Collection Time: 02/18/15  4:15 AM  Result Value Ref Range   Cholesterol 151 0 - 200 mg/dL   Triglycerides 49 <150 mg/dL   HDL 65 >40 mg/dL   Total CHOL/HDL Ratio 2.3 RATIO   VLDL 10 0 - 40 mg/dL   LDL Cholesterol 76 0 - 99 mg/dL   Dg Chest 2 View  02/17/2015  CLINICAL DATA:  65 year old presenting with 2 day history of left body numbness and acute onset  of dizziness and chest pain which began earlier today. Stress due to the death of her fiance this week. Current history of hypertension. EXAM: CHEST  2 VIEW COMPARISON:  02/11/2011 and earlier. FINDINGS: Cardiac silhouette normal in size, unchanged. Thoracic aorta mildly tortuous and atherosclerotic, unchanged. Hilar and mediastinal contours otherwise unremarkable. Calcified granuloma involving the right lower lobe versus dense focal calcification in the costal cartilage of the anterior right sixth rib, unchanged. Lungs otherwise clear. No localized airspace consolidation. No pleural effusions. No pneumothorax. Normal pulmonary vascularity. Mild degenerative changes involving the thoracic spine. IMPRESSION: No acute cardiopulmonary disease. Electronically Signed   By: Evangeline Dakin M.D.   On: 02/17/2015 18:30     ASSESSMENT AND PLAN:  Pt with new onset chest pain with nausea. SHe was found to hypokalemic with potassium of 2.8. She had been recently started on Hyzaar as of 01/18/2015. Hydrochlorothiazide has been dc'd in the hospital and potassium replaced. The patient is under stress having lost her fiance and the funeral is tomorrow. Troponin was mildly elevated, possibly related to hypokalemia. Follow up troponins were normal.  Agree with medications and inpatient testing today, as pt will have a tough day tomorrow and may have more problems.  Daune Perch, NP-C

## 2015-02-18 NOTE — Care Management Obs Status (Signed)
[facility] NOTIFICATION   Patient Details  Name: Alicia [name] MRN: [id number] Date of Birth: October 19, 1950   Medicare Observation Status Notification Given:  No  CM covering this patient left unit unexpectedly  and CM that was covering was unaware obs notice was not given.  Patient discharged < 24 hours    Katrina Stack, RN 02/18/2015, 5:21 PM

## 2015-02-18 NOTE — Plan of Care (Signed)
Problem: Phase I Progression Outcomes Goal: Anginal pain relieved Outcome: Completed/Met Date Met:  02/18/15 No complaints of chest pain this shift.

## 2015-02-18 NOTE — Plan of Care (Signed)
Problem: Phase I Progression Outcomes Goal: Anginal pain relieved Outcome: Progressing No chest pain during shift will continue to monitor patient.

## 2015-02-18 NOTE — Care Management (Signed)
No discharge needs. 

## 2015-02-18 NOTE — Progress Notes (Signed)
Pt. Discharged to home via wc. Discharge instructions and medication regimen reviewed at bedside with patient. Pt. verbalizes understanding of instructions and medication regimen. Prescriptions included with discharge papers. Patient assessment unchanged from this morning. TELE and IV discontinued per policy.   

## 2015-02-18 NOTE — Progress Notes (Signed)
SUBJECTIVE: NO chest pains  Filed Vitals:   02/17/15 2130 02/17/15 2238 02/18/15 0505 02/18/15 1242  BP: 133/70 128/66 122/66 133/61  Pulse: 64 79 61 58  Temp:  97.5 F (36.4 C) 97.9 F (36.6 C) 98 F (36.7 C)  TempSrc:  Oral Oral Oral  Resp: 11 21 21 20   Height:      Weight:  211 lb 3.2 oz (95.8 kg)    SpO2: 96% 99% 97% 95%   No intake or output data in the 24 hours ending 02/18/15 1305  LABS: Basic Metabolic Panel:  Recent Labs  02/17/15 1747 02/18/15 0415  NA 138 141  K 2.8* 4.4  CL 104 110  CO2 25 24  GLUCOSE 117* 100*  BUN 11 14  CREATININE 0.92 0.78  CALCIUM 9.5 8.9  MG 2.0  --    Liver Function Tests: No results for input(s): AST, ALT, ALKPHOS, BILITOT, PROT, ALBUMIN in the last 72 hours. No results for input(s): LIPASE, AMYLASE in the last 72 hours. CBC:  Recent Labs  02/17/15 1747  WBC 7.7  HGB 13.4  HCT 40.2  MCV 86.5  PLT 203   Cardiac Enzymes:  Recent Labs  02/17/15 1747 02/17/15 2213 02/18/15 0415  TROPONINI 0.05* <0.03 <0.03   BNP: Invalid input(s): POCBNP D-Dimer: No results for input(s): DDIMER in the last 72 hours. Hemoglobin A1C: No results for input(s): HGBA1C in the last 72 hours. Fasting Lipid Panel:  Recent Labs  02/18/15 0415  CHOL 151  HDL 65  LDLCALC 76  TRIG 49  CHOLHDL 2.3   Thyroid Function Tests: No results for input(s): TSH, T4TOTAL, T3FREE, THYROIDAB in the last 72 hours.  Invalid input(s): FREET3 Anemia Panel: No results for input(s): VITAMINB12, FOLATE, FERRITIN, TIBC, IRON, RETICCTPCT in the last 72 hours.   PHYSICAL EXAM General: Well developed, well nourished, in no acute distress HEENT:  Normocephalic and atramatic Neck:  No JVD.  Lungs: Clear bilaterally to auscultation and percussion. Heart: HRRR . Normal S1 and S2 without gallops or murmurs.  Abdomen: Bowel sounds are positive, abdomen soft and non-tender  Msk:  Back normal, normal gait. Normal strength and tone for age. Extremities: No  clubbing, cyanosis or edema.   Neuro: Alert and oriented X 3. Psych:  Good affect, responds appropriately  TELEMETRY:  ASSESSMENT AND PLAN: Chest pains resolved advise asp/plavix/imdur and f/u next week Thursday. Stress test mildly abnormal, but may go home with f/u next week.  Active Problems:   Elevated troponin    Escarlet Saathoff A, MD, [facility] 02/18/2015 1:05 PM

## 2015-02-18 NOTE — Plan of Care (Signed)
Problem: Consults Goal: Chest Pain Patient Education (See Patient Education module for education specifics.) Outcome: Progressing Hand outs given to patients  Problem: Phase I Progression Outcomes Goal: Hemodynamically stable Outcome: Progressing VS stabled during shift

## 2015-02-18 NOTE — Discharge Summary (Signed)
[facility] NAME: Alicia [name]    MR#:  [id number]  DATE OF BIRTH:  23-Jan-1950  DATE OF ADMISSION:  02/17/2015 ADMITTING PHYSICIAN: Demetrios Loll, MD  DATE OF DISCHARGE: 02/18/2015  3:43 PM  PRIMARY CARE PHYSICIAN: MASOUD,JAVED, MD    ADMISSION DIAGNOSIS:  Chest pain, unspecified chest pain type [R07.9]  DISCHARGE DIAGNOSIS:  Active Problems:   Elevated troponin   SECONDARY DIAGNOSIS:   Past Medical History  Diagnosis Date  . Hypertension   . Anxiety     HOSPITAL COURSE:   65 year old female with past medical history of hypertension, anxiety who presented to the hospital with chest pain  #1 chest pain-patient's working diagnosis at admission was suspected non-ST elevation MI patient had a mildly elevated troponin. Patient underwent a nuclear medicine stress test which was mildly positive. -Patient was seen by cardiology and they recommended patient should be discharged on aspirin, Plavix, Imdur and with follow-up with them as an outpatient. She was clinically asymptomatic at discharge. Discussed with Dr. Humphrey Rolls he plans on doing a cardiac CT at his office next week.  #2 anxiety-patient will continue her Xanax as needed.  #3 hypertension-patient will continue her losartan/HCTZ and Imdur was added given her positive stress test and chest pain as mentioned above.  Patient's fianc has recently passed away and she has a funeral coming up tomorrow which she would like to attend. Patient possibly could have benefited from a cardiac catheterization given her positive stress test but since patient is clinically asymptomatic and wants to attend her fianc's funeral she is being discharged home with close follow-up with cardiology as an outpatient.  DISCHARGE CONDITIONS:   Stable.   CONSULTS OBTAINED:  Treatment Team:  Dionisio David, MD  DRUG ALLERGIES:  No Known Allergies  DISCHARGE MEDICATIONS:   Discharge Medication  List as of 02/18/2015  2:47 PM    START taking these medications   Details  clopidogrel (PLAVIX) 75 MG tablet Take 1 tablet (75 mg total) by mouth daily., Starting 02/18/2015, Until Discontinued, Print    isosorbide mononitrate (IMDUR) 30 MG 24 hr tablet Take 1 tablet (30 mg total) by mouth daily., Starting 02/18/2015, Until Discontinued, Print      CONTINUE these medications which have NOT CHANGED   Details  ALPRAZolam (XANAX) 0.25 MG tablet Take 0.25 mg by mouth at bedtime., Until Discontinued, Historical Med    aspirin EC 81 MG tablet Take 81 mg by mouth daily., Until Discontinued, Historical Med    losartan-hydrochlorothiazide (HYZAAR) 50-12.5 MG tablet Take 0.5 tablets by mouth daily., Until Discontinued, Historical Med    HYDROcodone-acetaminophen (NORCO/VICODIN) 5-325 MG tablet Take 1 tablet by mouth every 4 (four) hours as needed for moderate pain., Starting 01/11/2015, Until Discontinued, Print      STOP taking these medications     sulfamethoxazole-trimethoprim (BACTRIM DS,SEPTRA DS) 800-160 MG tablet          DISCHARGE INSTRUCTIONS:   DIET:  Cardiac diet  DISCHARGE CONDITION:  Stable  ACTIVITY:  Activity as tolerated  OXYGEN:  Home Oxygen: No.   Oxygen Delivery: room air  DISCHARGE LOCATION:  home   If you experience worsening of your admission symptoms, develop shortness of breath, life threatening emergency, suicidal or homicidal thoughts you must seek medical attention immediately by calling 911 or calling your MD immediately  if symptoms less severe.  You Must read complete instructions/literature along with all the possible adverse reactions/side effects for all the Medicines  you take and that have been prescribed to you. Take any new Medicines after you have completely understood and accpet all the possible adverse reactions/side effects.   Please note  You were cared for by a hospitalist during your hospital stay. If you have any questions about your  discharge medications or the care you received while you were in the hospital after you are discharged, you can call the unit and asked to speak with the hospitalist on call if the hospitalist that took care of you is not available. Once you are discharged, your primary care physician will handle any further medical issues. Please note that NO REFILLS for any discharge medications will be authorized once you are discharged, as it is imperative that you return to your primary care physician (or establish a relationship with a primary care physician if you do not have one) for your aftercare needs so that they can reassess your need for medications and monitor your lab values.     Today   Chest pain-free. No nausea, vomiting, shortness of breath.  VITAL SIGNS:  Blood pressure 133/61, pulse 58, temperature 98 F (36.7 C), temperature source Oral, resp. rate 20, height 5\' 6"  (1.676 m), weight 95.8 kg (211 lb 3.2 oz), SpO2 95 %.  I/O:   Intake/Output Summary (Last 24 hours) at 02/18/15 1733 Last data filed at 02/18/15 1130  Gross per 24 hour  Intake    240 ml  Output      0 ml  Net    240 ml    PHYSICAL EXAMINATION:  GENERAL:  65 y.o.-year-old patient lying in the bed with no acute distress.  EYES: Pupils equal, round, reactive to light and accommodation. No scleral icterus. Extraocular muscles intact.  HEENT: Head atraumatic, normocephalic. Oropharynx and nasopharynx clear.  NECK:  Supple, no jugular venous distention. No thyroid enlargement, no tenderness.  LUNGS: Normal breath sounds bilaterally, no wheezing, rales,rhonchi. No use of accessory muscles of respiration.  CARDIOVASCULAR: S1, S2 normal. No murmurs, rubs, or gallops.  ABDOMEN: Soft, non-tender, non-distended. Bowel sounds present. No organomegaly or mass.  EXTREMITIES: No pedal edema, cyanosis, or clubbing.  NEUROLOGIC: Cranial nerves II through XII are intact. No focal motor or sensory defecits b/l.  PSYCHIATRIC: The  patient is alert and oriented x 3. Good affect.  SKIN: No obvious rash, lesion, or ulcer.   DATA REVIEW:   CBC  Recent Labs Lab 02/17/15 1747  WBC 7.7  HGB 13.4  HCT 40.2  PLT 203    Chemistries   Recent Labs Lab 02/17/15 1747 02/18/15 0415  NA 138 141  K 2.8* 4.4  CL 104 110  CO2 25 24  GLUCOSE 117* 100*  BUN 11 14  CREATININE 0.92 0.78  CALCIUM 9.5 8.9  MG 2.0  --     Cardiac Enzymes  Recent Labs Lab 02/18/15 0415  TROPONINI <0.03    Microbiology Results  No results found for this or any previous visit.  RADIOLOGY:  Dg Chest 2 View  02/17/2015  CLINICAL DATA:  65 year old presenting with 2 day history of left body numbness and acute onset of dizziness and chest pain which began earlier today. Stress due to the death of her fiance this week. Current history of hypertension. EXAM: CHEST  2 VIEW COMPARISON:  02/11/2011 and earlier. FINDINGS: Cardiac silhouette normal in size, unchanged. Thoracic aorta mildly tortuous and atherosclerotic, unchanged. Hilar and mediastinal contours otherwise unremarkable. Calcified granuloma involving the right lower lobe versus dense focal calcification in the  costal cartilage of the anterior right sixth rib, unchanged. Lungs otherwise clear. No localized airspace consolidation. No pleural effusions. No pneumothorax. Normal pulmonary vascularity. Mild degenerative changes involving the thoracic spine. IMPRESSION: No acute cardiopulmonary disease. Electronically Signed   By: Evangeline Dakin M.D.   On: 02/17/2015 18:30   Nm Myocar Multi W/spect W/wall Motion / Ef  02/18/2015   Defect 1: There is a small defect of mild severity present in the basal anterior location.  Findings consistent with ischemia.  This is an intermediate risk study.  The left ventricular ejection fraction is hyperdynamic (>65%).       Management plans discussed with the patient, family and they are in agreement.  CODE STATUS:     Code Status Orders         Start     Ordered   02/17/15 2234  Full code   Continuous     02/17/15 2233    Code Status History    Date Active Date Inactive Code Status Order ID Comments User Context   This patient has a current code status but no historical code status.      TOTAL TIME TAKING CARE OF THIS PATIENT: 40 minutes.    Henreitta Leber M.D on 02/18/2015 at 5:33 PM  Between 7am to 6pm - Pager - [phone]  After 6pm go to [email or web address] - password [email or web address] [facility]  Office  [phone]  CC: Primary care physician; [name], MD

## 2015-02-22 DIAGNOSIS — R079 Chest pain, unspecified: Secondary | ICD-10-CM | POA: Diagnosis not present

## 2015-02-22 DIAGNOSIS — I10 Essential (primary) hypertension: Secondary | ICD-10-CM | POA: Diagnosis not present

## 2015-02-22 DIAGNOSIS — K219 Gastro-esophageal reflux disease without esophagitis: Secondary | ICD-10-CM | POA: Diagnosis not present

## 2015-02-25 DIAGNOSIS — R9430 Abnormal result of cardiovascular function study, unspecified: Secondary | ICD-10-CM | POA: Diagnosis not present

## 2015-03-03 DIAGNOSIS — R9430 Abnormal result of cardiovascular function study, unspecified: Secondary | ICD-10-CM | POA: Diagnosis not present

## 2015-03-03 DIAGNOSIS — R42 Dizziness and giddiness: Secondary | ICD-10-CM | POA: Diagnosis not present

## 2015-03-03 DIAGNOSIS — I10 Essential (primary) hypertension: Secondary | ICD-10-CM | POA: Diagnosis not present

## 2015-03-03 DIAGNOSIS — R079 Chest pain, unspecified: Secondary | ICD-10-CM | POA: Diagnosis not present

## 2015-03-31 DIAGNOSIS — I340 Nonrheumatic mitral (valve) insufficiency: Secondary | ICD-10-CM | POA: Diagnosis not present

## 2015-03-31 DIAGNOSIS — I351 Nonrheumatic aortic (valve) insufficiency: Secondary | ICD-10-CM | POA: Diagnosis not present

## 2015-03-31 DIAGNOSIS — I10 Essential (primary) hypertension: Secondary | ICD-10-CM | POA: Diagnosis not present

## 2015-04-15 DIAGNOSIS — I10 Essential (primary) hypertension: Secondary | ICD-10-CM | POA: Diagnosis not present

## 2015-04-15 DIAGNOSIS — F410 Panic disorder [episodic paroxysmal anxiety] without agoraphobia: Secondary | ICD-10-CM | POA: Diagnosis not present

## 2015-04-15 DIAGNOSIS — K219 Gastro-esophageal reflux disease without esophagitis: Secondary | ICD-10-CM | POA: Diagnosis not present

## 2015-07-12 DIAGNOSIS — R011 Cardiac murmur, unspecified: Secondary | ICD-10-CM | POA: Diagnosis not present

## 2015-07-12 DIAGNOSIS — R002 Palpitations: Secondary | ICD-10-CM | POA: Diagnosis not present

## 2015-07-12 DIAGNOSIS — H81319 Aural vertigo, unspecified ear: Secondary | ICD-10-CM | POA: Diagnosis not present

## 2015-07-12 DIAGNOSIS — K219 Gastro-esophageal reflux disease without esophagitis: Secondary | ICD-10-CM | POA: Diagnosis not present

## 2015-10-11 DIAGNOSIS — E034 Atrophy of thyroid (acquired): Secondary | ICD-10-CM | POA: Diagnosis not present

## 2015-10-11 DIAGNOSIS — K219 Gastro-esophageal reflux disease without esophagitis: Secondary | ICD-10-CM | POA: Diagnosis not present

## 2015-10-11 DIAGNOSIS — F410 Panic disorder [episodic paroxysmal anxiety] without agoraphobia: Secondary | ICD-10-CM | POA: Diagnosis not present

## 2015-10-11 DIAGNOSIS — N3090 Cystitis, unspecified without hematuria: Secondary | ICD-10-CM | POA: Diagnosis not present

## 2015-10-11 DIAGNOSIS — R300 Dysuria: Secondary | ICD-10-CM | POA: Diagnosis not present

## 2015-10-18 DIAGNOSIS — E039 Hypothyroidism, unspecified: Secondary | ICD-10-CM | POA: Diagnosis not present

## 2015-10-18 DIAGNOSIS — K219 Gastro-esophageal reflux disease without esophagitis: Secondary | ICD-10-CM | POA: Diagnosis not present

## 2015-10-18 DIAGNOSIS — I4891 Unspecified atrial fibrillation: Secondary | ICD-10-CM | POA: Diagnosis not present

## 2015-10-18 DIAGNOSIS — F410 Panic disorder [episodic paroxysmal anxiety] without agoraphobia: Secondary | ICD-10-CM | POA: Diagnosis not present

## 2015-11-24 DIAGNOSIS — E039 Hypothyroidism, unspecified: Secondary | ICD-10-CM | POA: Diagnosis not present

## 2015-11-24 DIAGNOSIS — K219 Gastro-esophageal reflux disease without esophagitis: Secondary | ICD-10-CM | POA: Diagnosis not present

## 2015-11-24 DIAGNOSIS — R002 Palpitations: Secondary | ICD-10-CM | POA: Diagnosis not present

## 2015-11-24 DIAGNOSIS — F410 Panic disorder [episodic paroxysmal anxiety] without agoraphobia: Secondary | ICD-10-CM | POA: Diagnosis not present

## 2015-11-25 DIAGNOSIS — E034 Atrophy of thyroid (acquired): Secondary | ICD-10-CM | POA: Diagnosis not present

## 2015-12-06 DIAGNOSIS — E039 Hypothyroidism, unspecified: Secondary | ICD-10-CM | POA: Diagnosis not present

## 2015-12-06 DIAGNOSIS — K219 Gastro-esophageal reflux disease without esophagitis: Secondary | ICD-10-CM | POA: Diagnosis not present

## 2015-12-06 DIAGNOSIS — R002 Palpitations: Secondary | ICD-10-CM | POA: Diagnosis not present

## 2015-12-06 DIAGNOSIS — I4891 Unspecified atrial fibrillation: Secondary | ICD-10-CM | POA: Diagnosis not present

## 2016-01-05 DIAGNOSIS — R011 Cardiac murmur, unspecified: Secondary | ICD-10-CM | POA: Diagnosis not present

## 2016-01-05 DIAGNOSIS — K219 Gastro-esophageal reflux disease without esophagitis: Secondary | ICD-10-CM | POA: Diagnosis not present

## 2016-01-05 DIAGNOSIS — R002 Palpitations: Secondary | ICD-10-CM | POA: Diagnosis not present

## 2016-01-05 DIAGNOSIS — E039 Hypothyroidism, unspecified: Secondary | ICD-10-CM | POA: Diagnosis not present

## 2016-01-17 DIAGNOSIS — J210 Acute bronchiolitis due to respiratory syncytial virus: Secondary | ICD-10-CM | POA: Diagnosis not present

## 2016-01-17 DIAGNOSIS — I4891 Unspecified atrial fibrillation: Secondary | ICD-10-CM | POA: Diagnosis not present

## 2016-01-17 DIAGNOSIS — R002 Palpitations: Secondary | ICD-10-CM | POA: Diagnosis not present

## 2016-01-17 DIAGNOSIS — E039 Hypothyroidism, unspecified: Secondary | ICD-10-CM | POA: Diagnosis not present

## 2016-04-05 DIAGNOSIS — I4891 Unspecified atrial fibrillation: Secondary | ICD-10-CM | POA: Diagnosis not present

## 2016-04-05 DIAGNOSIS — R002 Palpitations: Secondary | ICD-10-CM | POA: Diagnosis not present

## 2016-04-05 DIAGNOSIS — R011 Cardiac murmur, unspecified: Secondary | ICD-10-CM | POA: Diagnosis not present

## 2016-04-05 DIAGNOSIS — E039 Hypothyroidism, unspecified: Secondary | ICD-10-CM | POA: Diagnosis not present

## 2016-04-17 DIAGNOSIS — E039 Hypothyroidism, unspecified: Secondary | ICD-10-CM | POA: Diagnosis not present

## 2016-04-17 DIAGNOSIS — I4891 Unspecified atrial fibrillation: Secondary | ICD-10-CM | POA: Diagnosis not present

## 2016-04-17 DIAGNOSIS — R002 Palpitations: Secondary | ICD-10-CM | POA: Diagnosis not present

## 2016-04-17 DIAGNOSIS — R011 Cardiac murmur, unspecified: Secondary | ICD-10-CM | POA: Diagnosis not present

## 2016-05-08 DIAGNOSIS — K219 Gastro-esophageal reflux disease without esophagitis: Secondary | ICD-10-CM | POA: Diagnosis not present

## 2016-05-08 DIAGNOSIS — R002 Palpitations: Secondary | ICD-10-CM | POA: Diagnosis not present

## 2016-05-08 DIAGNOSIS — F410 Panic disorder [episodic paroxysmal anxiety] without agoraphobia: Secondary | ICD-10-CM | POA: Diagnosis not present

## 2016-05-08 DIAGNOSIS — I4891 Unspecified atrial fibrillation: Secondary | ICD-10-CM | POA: Diagnosis not present

## 2016-07-23 DIAGNOSIS — E669 Obesity, unspecified: Secondary | ICD-10-CM | POA: Diagnosis not present

## 2016-07-23 DIAGNOSIS — R002 Palpitations: Secondary | ICD-10-CM | POA: Diagnosis not present

## 2016-07-23 DIAGNOSIS — E039 Hypothyroidism, unspecified: Secondary | ICD-10-CM | POA: Diagnosis not present

## 2016-07-23 DIAGNOSIS — K219 Gastro-esophageal reflux disease without esophagitis: Secondary | ICD-10-CM | POA: Diagnosis not present

## 2016-10-23 DIAGNOSIS — I4891 Unspecified atrial fibrillation: Secondary | ICD-10-CM | POA: Diagnosis not present

## 2016-10-23 DIAGNOSIS — E669 Obesity, unspecified: Secondary | ICD-10-CM | POA: Diagnosis not present

## 2016-10-23 DIAGNOSIS — K219 Gastro-esophageal reflux disease without esophagitis: Secondary | ICD-10-CM | POA: Diagnosis not present

## 2016-10-23 DIAGNOSIS — F410 Panic disorder [episodic paroxysmal anxiety] without agoraphobia: Secondary | ICD-10-CM | POA: Diagnosis not present

## 2017-01-15 DIAGNOSIS — Z9221 Personal history of antineoplastic chemotherapy: Secondary | ICD-10-CM

## 2017-01-15 HISTORY — DX: Personal history of antineoplastic chemotherapy: Z92.21

## 2017-01-25 DIAGNOSIS — F410 Panic disorder [episodic paroxysmal anxiety] without agoraphobia: Secondary | ICD-10-CM | POA: Diagnosis not present

## 2017-01-25 DIAGNOSIS — I4891 Unspecified atrial fibrillation: Secondary | ICD-10-CM | POA: Diagnosis not present

## 2017-01-25 DIAGNOSIS — K219 Gastro-esophageal reflux disease without esophagitis: Secondary | ICD-10-CM | POA: Diagnosis not present

## 2017-01-25 DIAGNOSIS — R011 Cardiac murmur, unspecified: Secondary | ICD-10-CM | POA: Diagnosis not present

## 2017-01-29 DIAGNOSIS — K219 Gastro-esophageal reflux disease without esophagitis: Secondary | ICD-10-CM | POA: Diagnosis not present

## 2017-01-29 DIAGNOSIS — I4891 Unspecified atrial fibrillation: Secondary | ICD-10-CM | POA: Diagnosis not present

## 2017-01-29 DIAGNOSIS — R011 Cardiac murmur, unspecified: Secondary | ICD-10-CM | POA: Diagnosis not present

## 2017-01-29 DIAGNOSIS — F410 Panic disorder [episodic paroxysmal anxiety] without agoraphobia: Secondary | ICD-10-CM | POA: Diagnosis not present

## 2017-01-29 DIAGNOSIS — R42 Dizziness and giddiness: Secondary | ICD-10-CM | POA: Diagnosis not present

## 2017-03-26 DIAGNOSIS — I10 Essential (primary) hypertension: Secondary | ICD-10-CM | POA: Diagnosis not present

## 2017-03-26 DIAGNOSIS — R011 Cardiac murmur, unspecified: Secondary | ICD-10-CM | POA: Diagnosis not present

## 2017-03-26 DIAGNOSIS — F410 Panic disorder [episodic paroxysmal anxiety] without agoraphobia: Secondary | ICD-10-CM | POA: Diagnosis not present

## 2017-03-26 DIAGNOSIS — I4891 Unspecified atrial fibrillation: Secondary | ICD-10-CM | POA: Diagnosis not present

## 2017-03-26 DIAGNOSIS — E034 Atrophy of thyroid (acquired): Secondary | ICD-10-CM | POA: Diagnosis not present

## 2017-03-26 DIAGNOSIS — R5381 Other malaise: Secondary | ICD-10-CM | POA: Diagnosis not present

## 2017-03-26 DIAGNOSIS — K219 Gastro-esophageal reflux disease without esophagitis: Secondary | ICD-10-CM | POA: Diagnosis not present

## 2017-04-01 DIAGNOSIS — I4891 Unspecified atrial fibrillation: Secondary | ICD-10-CM | POA: Diagnosis not present

## 2017-04-01 DIAGNOSIS — K219 Gastro-esophageal reflux disease without esophagitis: Secondary | ICD-10-CM | POA: Diagnosis not present

## 2017-04-01 DIAGNOSIS — E669 Obesity, unspecified: Secondary | ICD-10-CM | POA: Diagnosis not present

## 2017-04-01 DIAGNOSIS — F410 Panic disorder [episodic paroxysmal anxiety] without agoraphobia: Secondary | ICD-10-CM | POA: Diagnosis not present

## 2017-06-27 DIAGNOSIS — E669 Obesity, unspecified: Secondary | ICD-10-CM | POA: Diagnosis not present

## 2017-06-27 DIAGNOSIS — K219 Gastro-esophageal reflux disease without esophagitis: Secondary | ICD-10-CM | POA: Diagnosis not present

## 2017-06-27 DIAGNOSIS — E039 Hypothyroidism, unspecified: Secondary | ICD-10-CM | POA: Diagnosis not present

## 2017-06-27 DIAGNOSIS — R002 Palpitations: Secondary | ICD-10-CM | POA: Diagnosis not present

## 2017-06-28 ENCOUNTER — Other Ambulatory Visit: Payer: Self-pay | Admitting: Internal Medicine

## 2017-06-28 DIAGNOSIS — N6320 Unspecified lump in the left breast, unspecified quadrant: Secondary | ICD-10-CM

## 2017-07-01 ENCOUNTER — Encounter: Payer: Self-pay | Admitting: *Deleted

## 2017-07-01 DIAGNOSIS — K219 Gastro-esophageal reflux disease without esophagitis: Secondary | ICD-10-CM | POA: Diagnosis not present

## 2017-07-01 DIAGNOSIS — F410 Panic disorder [episodic paroxysmal anxiety] without agoraphobia: Secondary | ICD-10-CM | POA: Diagnosis not present

## 2017-07-01 DIAGNOSIS — R011 Cardiac murmur, unspecified: Secondary | ICD-10-CM | POA: Diagnosis not present

## 2017-07-01 DIAGNOSIS — I4891 Unspecified atrial fibrillation: Secondary | ICD-10-CM | POA: Diagnosis not present

## 2017-07-01 DIAGNOSIS — E669 Obesity, unspecified: Secondary | ICD-10-CM | POA: Diagnosis not present

## 2017-07-02 DIAGNOSIS — I4891 Unspecified atrial fibrillation: Secondary | ICD-10-CM | POA: Diagnosis not present

## 2017-07-02 DIAGNOSIS — R002 Palpitations: Secondary | ICD-10-CM | POA: Diagnosis not present

## 2017-07-02 DIAGNOSIS — E669 Obesity, unspecified: Secondary | ICD-10-CM | POA: Diagnosis not present

## 2017-07-02 DIAGNOSIS — F410 Panic disorder [episodic paroxysmal anxiety] without agoraphobia: Secondary | ICD-10-CM | POA: Diagnosis not present

## 2017-07-02 DIAGNOSIS — K219 Gastro-esophageal reflux disease without esophagitis: Secondary | ICD-10-CM | POA: Diagnosis not present

## 2017-07-05 ENCOUNTER — Ambulatory Visit
Admission: RE | Admit: 2017-07-05 | Discharge: 2017-07-05 | Disposition: A | Discharge status: Home / Self Care | Payer: Medicare Other | Source: Ambulatory Visit | Attending: Internal Medicine | Admitting: Internal Medicine

## 2017-07-05 DIAGNOSIS — N6320 Unspecified lump in the left breast, unspecified quadrant: Secondary | ICD-10-CM

## 2017-07-05 DIAGNOSIS — R928 Other abnormal and inconclusive findings on diagnostic imaging of breast: Secondary | ICD-10-CM | POA: Diagnosis not present

## 2017-07-05 DIAGNOSIS — N6321 Unspecified lump in the left breast, upper outer quadrant: Secondary | ICD-10-CM | POA: Diagnosis not present

## 2017-07-10 ENCOUNTER — Other Ambulatory Visit: Payer: Self-pay | Admitting: Internal Medicine

## 2017-07-10 DIAGNOSIS — R928 Other abnormal and inconclusive findings on diagnostic imaging of breast: Secondary | ICD-10-CM

## 2017-07-10 DIAGNOSIS — N6320 Unspecified lump in the left breast, unspecified quadrant: Secondary | ICD-10-CM

## 2017-07-15 DIAGNOSIS — C801 Malignant (primary) neoplasm, unspecified: Secondary | ICD-10-CM

## 2017-07-15 DIAGNOSIS — C50912 Malignant neoplasm of unspecified site of left female breast: Secondary | ICD-10-CM

## 2017-07-15 HISTORY — DX: Malignant neoplasm of unspecified site of left female breast: C50.912

## 2017-07-15 HISTORY — DX: Malignant (primary) neoplasm, unspecified: C80.1

## 2017-07-16 ENCOUNTER — Ambulatory Visit
Admission: RE | Admit: 2017-07-16 | Discharge: 2017-07-16 | Disposition: A | Discharge status: Home / Self Care | Payer: Medicare Other | Source: Ambulatory Visit | Attending: Internal Medicine | Admitting: Internal Medicine

## 2017-07-16 DIAGNOSIS — R928 Other abnormal and inconclusive findings on diagnostic imaging of breast: Secondary | ICD-10-CM

## 2017-07-16 DIAGNOSIS — R590 Localized enlarged lymph nodes: Secondary | ICD-10-CM | POA: Diagnosis not present

## 2017-07-16 DIAGNOSIS — N6321 Unspecified lump in the left breast, upper outer quadrant: Secondary | ICD-10-CM | POA: Diagnosis not present

## 2017-07-16 DIAGNOSIS — N6320 Unspecified lump in the left breast, unspecified quadrant: Secondary | ICD-10-CM

## 2017-07-16 DIAGNOSIS — C773 Secondary and unspecified malignant neoplasm of axilla and upper limb lymph nodes: Secondary | ICD-10-CM | POA: Diagnosis not present

## 2017-07-16 DIAGNOSIS — C50412 Malignant neoplasm of upper-outer quadrant of left female breast: Secondary | ICD-10-CM | POA: Diagnosis not present

## 2017-07-16 HISTORY — PX: AXILLARY LYMPH NODE BIOPSY: SHX5737

## 2017-07-16 HISTORY — PX: BREAST BIOPSY: SHX20

## 2017-07-17 ENCOUNTER — Other Ambulatory Visit: Payer: Self-pay | Admitting: Anatomic Pathology & Clinical Pathology

## 2017-07-22 LAB — SURGICAL PATHOLOGY

## 2017-07-23 ENCOUNTER — Other Ambulatory Visit: Payer: Self-pay

## 2017-07-23 DIAGNOSIS — C50919 Malignant neoplasm of unspecified site of unspecified female breast: Secondary | ICD-10-CM

## 2017-07-23 NOTE — Progress Notes (Signed)
Patient aware of  Surgical consult ,and Med/Onc consult appointments.  Notified Dr. Jennette Kettle office of appointments.

## 2017-07-23 NOTE — Progress Notes (Signed)
  Oncology Nurse Navigator Documentation  Navigator Location: [facility] (07/23/17 1400)   )Navigator Encounter Type: Introductory phone call (07/23/17 1400)   Abnormal Finding Date: 07/05/17 (07/23/17 1400) Confirmed Diagnosis Date: 07/16/17 (07/23/17 1400)               Patient Visit Type: Initial (07/23/17 1400) Treatment Phase: Pre-Tx/Tx Discussion (07/23/17 1400) Barriers/Navigation Needs: Coordination of Care;Education (07/23/17 1400) Education: Accessing Care/ Finding Providers;Coping with Diagnosis/ Prognosis;Newly Diagnosed Cancer Education (07/23/17 1400) Interventions: Coordination of Care;Education (07/23/17 1400)   Coordination of Care: Appts (07/23/17 1400)                  Time Spent with Patient: 60 (07/23/17 1400)   Introduced to IT trainer.  Will Give Breast Cancer Treatment HAndbook/folder with hospital services at initial Med/Onc visit with Dr. Grayland Ormond on 07/25/17 at 3:00.  Scheduled with Dr. Bary Castilla on 07/24/17 at 3:30.

## 2017-07-24 ENCOUNTER — Encounter: Payer: Self-pay | Admitting: *Deleted

## 2017-07-24 ENCOUNTER — Other Ambulatory Visit: Payer: Self-pay

## 2017-07-24 ENCOUNTER — Ambulatory Visit (INDEPENDENT_AMBULATORY_CARE_PROVIDER_SITE_OTHER): Payer: Medicare Other | Admitting: General Surgery

## 2017-07-24 VITALS — BP 124/66 | HR 80 | Resp 14 | Ht 66.0 in | Wt 217.0 lb

## 2017-07-24 DIAGNOSIS — C50912 Malignant neoplasm of unspecified site of left female breast: Secondary | ICD-10-CM

## 2017-07-24 NOTE — Progress Notes (Signed)
Patient ID: Alicia [name], female   DOB: February 03, 1950, 67 y.o.   MRN: [id number]  Chief Complaint  Patient presents with  . Other    HPI Alicia [name] is a 67 y.o. female who presents for a breast evaluation. The most recent mammogram was done on 6/21/2019and left breast biopsy on 07/16/2017. Patient noticed here left nipple looked different around March.  Patient doesn't perform regular self breast checks and doesn't regular mammograms done.   Alicia [name], daughter in law is present.  HPI 3 cmm 6 Past Medical History:  Diagnosis Date  . Anxiety   . Hypertension     Past Surgical History:  Procedure Laterality Date  . AXILLARY LYMPH NODE BIOPSY Left 07/16/2017   METASTATIC MAMMARY CARCINOMA  . BREAST BIOPSY Left 07/16/2017   [place] bx of left breast mass and left breast LN.  INVASIVE MAMMARY CARCINOMA, NO SPECIAL TYPE.   Marland Kitchen BREAST EXCISIONAL BIOPSY Right 2001  . TONSILLECTOMY      Family History  Problem Relation Age of Onset  . Stroke Mother   . Stroke Father   . Heart attack Father   . Breast cancer Neg Hx     Social History Social History   Tobacco Use  . Smoking status: Current Some Day Smoker    Packs/day: 1.00    Types: Cigarettes  . Smokeless tobacco: Never Used  Substance Use Topics  . Alcohol use: Yes  . Drug use: Not on file    No Known Allergies  Current Outpatient Medications  Medication Sig Dispense Refill  . ALPRAZolam (XANAX) 0.25 MG tablet Take 0.25 mg by mouth at bedtime.    Marland Kitchen amLODipine (NORVASC) 5 MG tablet Take 5 mg by mouth daily.    Marland Kitchen aspirin EC 81 MG tablet Take 81 mg by mouth daily.    Marland Kitchen losartan-hydrochlorothiazide (HYZAAR) 50-12.5 MG tablet Take 0.5 tablets by mouth daily.    . Potassium 75 MG TABS Take by mouth.    . traZODone (DESYREL) 50 MG tablet Take 50 mg by mouth at bedtime.     No current facility-administered medications for this visit.     Review of Systems Review of Systems  Constitutional: Negative.   Respiratory:  Negative.   Cardiovascular: Negative.     Blood pressure 124/66, pulse 80, resp. rate 14, height 5' 6"  (1.676 m), weight 217 lb (98.4 kg).  Physical Exam Physical Exam  Constitutional: She is oriented to person, place, and time. She appears well-developed and well-nourished.  Eyes: Conjunctivae are normal. No scleral icterus.  Neck: Neck supple.  Cardiovascular: Normal rate, regular rhythm and normal heart sounds.  Pulmonary/Chest: Effort normal and breath sounds normal.  3cm Left breast mass 6 cm from nipple.     Lymphadenopathy:    She has no cervical adenopathy.    She has axillary adenopathy (Enlarged left axillary node.).       Right: No supraclavicular adenopathy present.       Left: No supraclavicular adenopathy present.  Neurological: She is oriented to person, place, and time.  Skin: Skin is warm and dry.    Data Reviewed Bilateral diagnostic mammograms and left breast ultrasound of July 05, 2017 reviewed.  DIAGNOSIS:  A. BREAST, LEFT, 2:30, 5 CM FROM NIPPLE; ULTRASOUND-GUIDED CORE BIOPSY:  - INVASIVE MAMMARY CARCINOMA, NO SPECIAL TYPE.   Size of invasive carcinoma: 15 mm in this sample  Histologic grade of invasive carcinoma: Grade 2            Glandular/tubular  differentiation score: 3            Nuclear pleomorphism score: 2            Mitotic rate score: 1            Total score: 6  Ductal carcinoma in situ: Present, focal  Lymphovascular invasion: Not identified  ER/PR/HER2: Immunohistochemistry will be performed on block A1, with  reflex to [place] for HER2 2+. The results will be reported in an addendum.   B. 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   BREAST BIOMARKER TESTS  Estrogen Receptor (ER) Status: POSITIVE  Percentage of cells with nuclear positivity: >90%  Average intensity of staining: Strong   Progesterone Receptor (PgR)  Status: POSITIVE  Percentage of cells with nuclear positivity: >90%  Average intensity of staining: Strong   HER2 (by immunohistochemistry): NEGATIVE (Score 1+)   Laboratory studies of March 26, 2017 from her PCP office were reviewed.  Hemoglobin 14.9 with an MCV of 84, white blood cell count 7100.  Platelet count of 222,000.  Liver function studies were noted for modest elevation of the serum alkaline phosphatase at 141 (33-130).  Normal serum transaminases and bilirubin.  Normal renal function with an estimated GFR of 62.  Creatinine 0.95.  Glucose of 89.  Normal electrolytes.  Assessment    Clinical T2, N1 carcinoma of the left breast.    Plan  The patient, her daughter-in-law and sister-in-law Alicia [name]) were informed of the status of the tumor and we reviewed her laboratory studies from March, 2019.  With a 3 cm tumor and a large positive axillary node, I think the patient is likely a candidate for additional imaging CT versus PET CT depending on insurance coverage.  Repeat liver function studies including alkaline phosphatase will be appropriate.  The patient has reasonable peripheral veins in the upper extremities, but she may still be a candidate for port placement.  The role of neoadjuvant chemotherapy was discussed with the advantage of confirming clinical response to the chosen agents and to making breast conservation more likely if this is the direction that she would like to proceed.  The risks associated with central venous access including arterial, pulmonary and venous injury were reviewed. The possible need for additional treatment if pulmonary injury occurs (chest tube placement) was discussed.  The patient is scheduled to meet with Alicia Hoh, MD for medical oncology tomorrow, and will look for his assessment regarding the role of neoadjuvant chemotherapy and the potential need for central venous access.  HPI, Physical Exam, Assessment and Plan have been  scribed under the direction and in the presence of Alicia Ard, MD.  Gaspar Cola, CMA  I have completed the exam and reviewed the above documentation for accuracy and completeness.  I agree with the above.  Haematologist has been used and any errors in dictation or transcription are unintentional.  Alicia [name], M.D., F.A.C.S.   Forest Gleason Lyal Husted 07/24/2017, 5:02 PM

## 2017-07-25 ENCOUNTER — Encounter: Payer: Self-pay | Admitting: *Deleted

## 2017-07-25 ENCOUNTER — Other Ambulatory Visit: Payer: Self-pay

## 2017-07-25 ENCOUNTER — Inpatient Hospital Stay: Payer: Medicare Other | Attending: Oncology | Admitting: Oncology

## 2017-07-25 ENCOUNTER — Encounter: Payer: Self-pay | Admitting: Oncology

## 2017-07-25 VITALS — BP 128/77 | HR 69 | Temp 96.2°F | Resp 18 | Ht 66.14 in | Wt 218.5 lb

## 2017-07-25 DIAGNOSIS — Z5111 Encounter for antineoplastic chemotherapy: Secondary | ICD-10-CM | POA: Diagnosis not present

## 2017-07-25 DIAGNOSIS — C50912 Malignant neoplasm of unspecified site of left female breast: Secondary | ICD-10-CM

## 2017-07-25 DIAGNOSIS — Z170 Estrogen receptor positive status [ER+]: Secondary | ICD-10-CM

## 2017-07-25 DIAGNOSIS — Z5189 Encounter for other specified aftercare: Secondary | ICD-10-CM | POA: Diagnosis not present

## 2017-07-25 DIAGNOSIS — F17210 Nicotine dependence, cigarettes, uncomplicated: Secondary | ICD-10-CM | POA: Diagnosis not present

## 2017-07-25 DIAGNOSIS — C50412 Malignant neoplasm of upper-outer quadrant of left female breast: Secondary | ICD-10-CM

## 2017-07-25 DIAGNOSIS — Z5181 Encounter for therapeutic drug level monitoring: Secondary | ICD-10-CM

## 2017-07-25 DIAGNOSIS — I10 Essential (primary) hypertension: Secondary | ICD-10-CM

## 2017-07-25 DIAGNOSIS — Z79899 Other long term (current) drug therapy: Secondary | ICD-10-CM

## 2017-07-25 NOTE — Progress Notes (Signed)
Here for new pt evaluation.  

## 2017-07-25 NOTE — Progress Notes (Signed)
START ON PATHWAY REGIMEN - Breast   Dose-Dense AC q14 days:   A cycle is every 14 days:     Doxorubicin      Cyclophosphamide      Pegfilgrastim-xxxx   **Always confirm dose/schedule in your pharmacy ordering system**  Paclitaxel 80 mg/m2 Weekly:   Administer weekly:     Paclitaxel   **Always confirm dose/schedule in your pharmacy ordering system**  Patient Characteristics: Preoperative or Nonsurgical Candidate (Clinical Staging), Neoadjuvant Therapy followed by Surgery, Invasive Disease, Chemotherapy, HER2 Negative/Unknown/Equivocal, ER Positive Therapeutic Status: Preoperative or Nonsurgical Candidate (Clinical Staging) AJCC M Category: cM0 AJCC Grade: G2 Breast Surgical Plan: Neoadjuvant Therapy followed by Surgery ER Status: Positive (+) AJCC 8 Stage Grouping: IIA HER2 Status: Negative (-) AJCC T Category: cT2 AJCC N Category: cN1 PR Status: Positive (+) Intent of Therapy: Curative Intent, Discussed with Patient 

## 2017-07-25 NOTE — Progress Notes (Signed)
  Oncology Nurse Navigator Documentation  Navigator Location: [facility] Onc (07/25/17 1700) Referral date to RadOnc/MedOnc: 07/25/17 (07/25/17 1700) )                        Treatment Phase: Pre-Tx/Tx Discussion (07/25/17 1700) Barriers/Navigation Needs: Education (07/25/17 1700) Education: Newly Diagnosed Cancer Education (07/25/17 1700) Interventions: Education (07/25/17 1700)     Education Method: Verbal;Written (07/25/17 1700)  Support Groups/Services: Breast Support Group (07/25/17 1700)             Time Spent with Patient: 90 (07/25/17 1700)   Met patient, her daughter-in-law and sister in-law today during her initial medical oncology consult with Dr. Grayland Ormond.  She is scheduled to start neoadjuvant chemotherapy with Adriamycin / cytoxan and Taxol in 2 weeks.  Gave patient breast cancer educational literature, "My Breast Cancer Treatment Handbook" by Josephine Igo, RN.  Info given on support group and cancer center resources.  She is to call if she has any questions or needs.

## 2017-07-30 ENCOUNTER — Other Ambulatory Visit: Payer: Self-pay | Admitting: General Surgery

## 2017-07-30 DIAGNOSIS — C50912 Malignant neoplasm of unspecified site of left female breast: Secondary | ICD-10-CM

## 2017-07-30 NOTE — Progress Notes (Signed)
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  INTERVAL HISTORY: Patient is a 67 year old female who recently self palpated a mass on her left breast.  Subsequent imaging and biopsy revealed the above-stated breast cancer.  She is anxious, but otherwise feels well.  She has no neurologic complaints.  She denies any recent fevers or illnesses.  She has a good appetite and denies weight loss.  She has no chest pain or shortness of breath.  She denies any nausea, vomiting, constipation, or diarrhea.  She has no urinary complaints.  Patient otherwise feels well and offers no further specific complaints today.  REVIEW OF SYSTEMS:   Review of Systems  Constitutional: Negative.  Negative for fever, malaise/fatigue and weight loss.  Respiratory: Negative.  Negative for cough and shortness of breath.   Cardiovascular: Negative.  Negative for chest pain and leg swelling.  Gastrointestinal: Negative.  Negative for abdominal pain and constipation.  Genitourinary: Negative.  Negative for dysuria.  Musculoskeletal: Negative.  Negative for back pain.  Skin: Negative.  Negative for rash.  Neurological: Negative.  Negative for focal weakness, weakness and headaches.  Psychiatric/Behavioral: The patient is nervous/anxious.     As per HPI. Otherwise, a complete review of systems is negative.  PAST MEDICAL HISTORY: Past Medical History:  Diagnosis Date  . Anxiety   . Hypertension   . Thyroid disease     PAST SURGICAL HISTORY: Past Surgical History:  Procedure Laterality Date  . AXILLARY LYMPH NODE BIOPSY Left 07/16/2017   METASTATIC MAMMARY CARCINOMA  . BREAST BIOPSY Left 07/16/2017   [place] bx of left breast mass and left breast LN.  INVASIVE MAMMARY CARCINOMA, NO SPECIAL TYPE.   Marland Kitchen BREAST EXCISIONAL BIOPSY Right 2001  . BREAST LUMPECTOMY Right    2001  . TONSILLECTOMY      FAMILY HISTORY: Family History  Problem Relation Age of Onset  . Stroke Mother   . Thyroid disease Mother   . Renal Disease Mother   . Stroke Father   . Heart attack Father   . Sudden death Father 11       suicide  . Anuerysm Brother   . Breast cancer Neg Hx     ADVANCED DIRECTIVES (Y/N):  N  HEALTH MAINTENANCE: Social History   Tobacco Use  . Smoking status: Current Some Day Smoker    Packs/day: 0.50    Years: 30.00    Pack years: 15.00    Types: Cigarettes  . Smokeless tobacco: Never Used  Substance Use Topics  . Alcohol use: Yes    Comment: occasional q 60mo  . Drug use: Never     Colonoscopy:  PAP:  Bone density:  Lipid panel:  No Known Allergies  Current Outpatient Medications  Medication Sig Dispense Refill  . ALPRAZolam (XANAX) 0.25 MG tablet Take 0.25 mg by mouth at bedtime.    .Marland KitchenamLODipine (NORVASC) 5 MG tablet Take 5 mg by mouth daily.    .Marland Kitchenaspirin EC 81 MG tablet Take 81 mg by mouth daily.    .Marland Kitchenlevothyroxine (SYNTHROID, LEVOTHROID) 100 MCG tablet Take 100 mcg by mouth daily before breakfast.    . Potassium 75 MG TABS  Take by mouth.     . traZODone (DESYREL) 50 MG tablet Take 50 mg by mouth at bedtime.     No current facility-administered medications for this visit.     OBJECTIVE: Vitals:   07/25/17 1535  BP: 128/77  Pulse: 69  Resp: 18  Temp: (!) 96.2 F (35.7 C)     Body mass index is 35.12 kg/m.    ECOG FS:0 - Asymptomatic  General: Well-developed, well-nourished, no acute distress. Eyes: Pink conjunctiva, anicteric sclera. HEENT: Normocephalic, moist mucous membranes, clear oropharnyx. Breast: Easily palpable 2 to 3 cm left upper quadrant breast mass. Lungs: Clear to auscultation  bilaterally. Heart: Regular rate and rhythm. No rubs, murmurs, or gallops. Abdomen: Soft, nontender, nondistended. No organomegaly noted, normoactive bowel sounds. Musculoskeletal: No edema, cyanosis, or clubbing. Neuro: Alert, answering all questions appropriately. Cranial nerves grossly intact. Skin: No rashes or petechiae noted. Psych: Normal affect.   LAB RESULTS:  Lab Results  Component Value Date   NA 141 02/18/2015   K 4.4 02/18/2015   CL 110 02/18/2015   CO2 24 02/18/2015   GLUCOSE 100 (H) 02/18/2015   BUN 14 02/18/2015   CREATININE 0.78 02/18/2015   CALCIUM 8.9 02/18/2015   PROT 7.0 02/11/2011   ALBUMIN 4.1 02/11/2011   AST 27 02/11/2011   ALT 36 02/11/2011   ALKPHOS 122 02/11/2011   BILITOT 0.5 02/11/2011   GFRNONAA >60 02/18/2015   GFRAA >60 02/18/2015    Lab Results  Component Value Date   WBC 7.7 02/17/2015   HGB 13.4 02/17/2015   HCT 40.2 02/17/2015   MCV 86.5 02/17/2015   PLT 203 02/17/2015     STUDIES: US Breast Ltd Uni Left Inc Axilla  Result Date: 07/05/2017 CLINICAL DATA:  Palpable lump left breast EXAM: DIGITAL DIAGNOSTIC BILATERAL MAMMOGRAM WITH CAD AND TOMO ULTRASOUND LEFT BREAST COMPARISON:  Previous exam(s). ACR Breast Density Category b: There are scattered areas of fibroglandular density. FINDINGS: Cc and MLO views of bilateral breasts, spot tangential view of left breast are submitted. There is a spiculated mass in the palpable area upper-outer quadrant left breast. Increased density lymph nodes identified in the left axilla. The right breasts is stable compared prior exam. Mammographic images were processed with CAD. Targeted ultrasound is performed, showing angulated spiculated mass at the palpable area left breast 2:30 o'clock 5 cm from nipple measuring 2.4 x 3.6 x 2.2 cm. There are abnormal enlarged lymph nodes without normal fatty hilum in the left axilla largest measuring 1.8 x 2.9 cm. IMPRESSION: Highly suspicious findings. RECOMMENDATION:  Ultrasound-guided core biopsies of the left breast mass and abnormal left axillary lymph nodes. I have discussed the findings and recommendations with the patient. Results were also provided in writing at the conclusion of the visit. If applicable, a reminder letter will be sent to the patient regarding the next appointment. BI-RADS CATEGORY  5: Highly suggestive of malignancy. Electronically Signed   By: Abelardo Diesel M.D.   On: 07/05/2017 15:05   Mm Diag Breast Tomo Bilateral  Result Date: 07/05/2017 CLINICAL DATA:  Palpable lump left breast EXAM: DIGITAL DIAGNOSTIC BILATERAL MAMMOGRAM WITH CAD AND TOMO ULTRASOUND LEFT BREAST COMPARISON:  Previous exam(s). ACR Breast Density Category b: There are scattered areas of fibroglandular density. FINDINGS: Cc and MLO views of bilateral breasts, spot tangential view of left breast are submitted. There is a spiculated mass in the palpable area upper-outer quadrant left breast. Increased density lymph nodes identified in the left axilla. The right breasts is stable  compared prior exam. Mammographic images were processed with CAD. Targeted ultrasound is performed, showing angulated spiculated mass at the palpable area left breast 2:30 o'clock 5 cm from nipple measuring 2.4 x 3.6 x 2.2 cm. There are abnormal enlarged lymph nodes without normal fatty hilum in the left axilla largest measuring 1.8 x 2.9 cm. IMPRESSION: Highly suspicious findings. RECOMMENDATION: Ultrasound-guided core biopsies of the left breast mass and abnormal left axillary lymph nodes. I have discussed the findings and recommendations with the patient. Results were also provided in writing at the conclusion of the visit. If applicable, a reminder letter will be sent to the patient regarding the next appointment. BI-RADS CATEGORY  5: Highly suggestive of malignancy. Electronically Signed   By: Abelardo Diesel M.D.   On: 07/05/2017 15:05   Mm Clip Placement Left  Result Date: 07/16/2017 CLINICAL DATA:   Ultrasound-guided biopsies of a palpable suspicious left breast mass and an enlarged left axillary lymph node were performed today. EXAM: DIAGNOSTIC LEFT MAMMOGRAM POST ULTRASOUND BIOPSIES COMPARISON:  Previous exam(s). FINDINGS: Mammographic images were obtained following ultrasound guided biopsy of a left breast mass at 2 o'clock position and a suspicious enlarged left axillary lymph node. Coil shaped biopsy clip is satisfactorily positioned within the biopsied mass. Lidocaine/biopsy changes are seen anterior to the enlarged biopsied lymph node, but the biopsy clip is not included on the image. Based on the ultrasound images at the end of the axillary lymph node biopsy, it is felt that the biopsy clip is on the deep side of the biopsied lymph node. IMPRESSION: Satisfactory position of coil shaped biopsy clip in the left breast mass. The biopsy clip placed within left axillary lymph node is not visible on the mammogram. Final Assessment: Post Procedure Mammograms for Marker Placement Electronically Signed   By: Curlene Dolphin M.D.   On: 07/16/2017 14:20   [place] Lt Breast Bx W Loc Dev 1st Lesion Img Bx Spec US Guide  Addendum Date: 07/23/2017   ADDENDUM REPORT: 07/23/2017 13:32 ADDENDUM: Pathology of the left breast biopsy revealed A. BREAST, LEFT, 2:30, 5 CM FROM NIPPLE; ULTRASOUND-GUIDED CORE BIOPSY: INVASIVE MAMMARY CARCINOMA, NO SPECIAL TYPE. Size of invasive carcinoma: 15 mm in this sample. Histologic grade of invasive carcinoma: Grade 2. Ductal carcinoma in situ: Present, focal. Lymphovascular invasion: Not identified. ER/PR/HER2: Immunohistochemistry will be performed on block A1, with reflex to [place] for HER2 2+. The results will be reported in an addendum. Comment: The definitive grade will be assigned on the excisional specimen. These findings were communicated to [facility] in Dr. Landis Gandy' office on 07/17/2017 at 2:50 PM. Read back procedure was performed. This was found to be concordant by Dr.  Radford Pax. Recommendation: Surgical and oncology referrals. The patient has an appointment scheduled with Dr. Bary Castilla for 08/01/17. At the patient's request, results and recommendations were relayed to the patient by phone by Dr. Miquel Dunn on 07/19/17. The patient stated she did well following the biopsy. Post biopsy instructions were reviewed with the patient and all of her questions were answered. She was encouraged to contact the [facility] with any further questions or concerns. The patient stated Dr. Lavera Guise was awaiting the results of the biopsy and would contact Dr. Dwyane Luo office to move up her appointment if the results were positive. Dr. Jennette Kettle office was closed at the time of conversation. Dr. Jennette Kettle office was contacted by phone by Jetta Lout, [place] on 07/22/17 with results. A copy of the pathology report was also faxed to Dr. Jennette Kettle office. They  will contact Dr. Dwyane Luo office after Dr. Lavera Guise has reviewed the report. Addendum by Jetta Lout, RRA on 07/23/17. Electronically Signed   By: Curlene Dolphin M.D.   On: 07/23/2017 13:32   Result Date: 07/23/2017 CLINICAL DATA:  Ultrasound-guided core needle biopsy was recommended of a palpable mass in the 2:30 position of the left breast. EXAM: ULTRASOUND GUIDED LEFT BREAST CORE NEEDLE BIOPSY COMPARISON:  Previous exam(s). FINDINGS: I met with the patient and we discussed the procedure of ultrasound-guided biopsy, including benefits and alternatives. We discussed the high likelihood of a successful procedure. We discussed the risks of the procedure, including infection, bleeding, tissue injury, clip migration, and inadequate sampling. Informed written consent was given. The usual time-out protocol was performed immediately prior to the procedure. Lesion quadrant: Upper outer quadrant Using sterile technique and 1% Lidocaine as local anesthetic, under direct ultrasound visualization, a 12 gauge spring-loaded device was used to perform biopsy of a  palpable irregular hypoechoic mass centered at 2:30 position 5 cm from the nipple using a lateral approach. At the conclusion of the procedure a coil tissue marker clip was deployed into the biopsy cavity. Follow up 2 view mammogram was performed and dictated separately. IMPRESSION: Ultrasound guided biopsy of the left breast. No apparent complications. Electronically Signed: By: Curlene Dolphin M.D. On: 07/16/2017 14:22   [place] Lt Breast Bx W Loc Dev Ea Add Lesion Img Bx Spec US Guide  Addendum Date: 07/23/2017   ADDENDUM REPORT: 07/23/2017 13:25 ADDENDUM: Pathology of the left axillary lymph node revealed B. LYMPH NODE, LEFT AXILLA; ULTRASOUND-GUIDED CORE BIOPSY: METASTATIC MAMMARY CARCINOMA, MEASURING AT LEAST 9 MM. This was found to be concordant by Dr. Radford Pax. Recommendation: Surgical and oncology referrals. The patient has an appointment scheduled with Dr. Bary Castilla for 08/01/17. At the patient's request, results and recommendations were relayed to the patient by phone by Dr. Miquel Dunn on 07/19/17. The patient stated she did well following the biopsy. Post biopsy instructions were reviewed with the patient and all of her questions were answered. She was encouraged to contact the [facility] with any further questions or concerns. The patient told Dr. Miquel Dunn that Dr. Lavera Guise was awaiting the results of the biopsy and would move her appointment up with Dr. Bary Castilla if the results were positive. His office was closed at the time of conversation with Dr. Miquel Dunn. Jetta Lout, RRA contacted Dr. Jennette Kettle office by phone on Monday, 07/22/17 and also faxed a copy of the pathology report to his office. They will contact Dr. Dwyane Luo office after Dr. Lavera Guise has reviewed the results. Addendum by Jetta Lout, RRA on 07/23/17. Electronically Signed   By: Curlene Dolphin M.D.   On: 07/23/2017 13:25   Result Date: 07/23/2017 CLINICAL DATA:  Ultrasound-guided core needle biopsy was recommended of an enlarged suspicious left axillary  lymph node. EXAM: [place] AXILLARY NODE CORE BIOPSY LEFT COMPARISON:  Previous exam(s). FINDINGS: I met with the patient and we discussed the procedure of ultrasound-guided biopsy, including benefits and alternatives. We discussed the high likelihood of a successful procedure. We discussed the risks of the procedure, including infection, bleeding, tissue injury, clip migration, and inadequate sampling. Informed written consent was given. The usual time-out protocol was performed immediately prior to the procedure. Using sterile technique and 1% Lidocaine as local anesthetic, under direct ultrasound visualization, a 14 gauge spring-loaded device was used to perform biopsy of an enlarged left axillary lymph node using a lateral approach. At the conclusion of the procedure a HydroMARK tissue marker clip  was deployed into the biopsy cavity. Follow up 2 view mammogram was performed and dictated separately. IMPRESSION: Ultrasound guided biopsy of a left axillary lymph node. No apparent complications. Electronically Signed: By: Curlene Dolphin M.D. 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  PLAN:    1. 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  Case was also discussed extensively at case conference.  Given the size and the stage of patient's malignancy, she will benefit from neoadjuvant chemotherapy using Adriamycin, Cytoxan, and Taxol.  Patient will also require Neulasta support.  Will get CT scan of the chest, abdomen, and pelvis to assess for any metastatic disease.  Patient will also require port placement and MUGA prior to initiating treatment.  Return to clinic on August 08, 2017 for further evaluation and initiation of cycle 104 of dose dense Adriamycin and Cytoxan.  I spent a total of 60  minutes face-to-face with the patient of which greater than 50% of the visit was spent in counseling and coordination of care as detailed above.   Patient expressed understanding and was in agreement with this plan. She also understands that She can call clinic at any time with any questions, concerns, or complaints.   Cancer Staging Breast cancer, stage 2, left ([place]) Staging form: Breast, AJCC 8th Edition - Clinical stage from 07/30/2017: Stage IIA (cT2, cN1, cM0, G2, ER+, PR+, HER2-) - Signed by Lloyd Huger, MD on 07/30/2017   Lloyd Huger, MD   07/30/2017 10:31 PM

## 2017-07-31 ENCOUNTER — Other Ambulatory Visit: Payer: Self-pay | Admitting: Oncology

## 2017-07-31 DIAGNOSIS — C50912 Malignant neoplasm of unspecified site of left female breast: Secondary | ICD-10-CM

## 2017-07-31 MED ORDER — LIDOCAINE-PRILOCAINE 2.5-2.5 % EX CREA: TOPICAL_CREAM | CUTANEOUS | 3 refills | Status: DC

## 2017-07-31 MED ORDER — ONDANSETRON HCL 8 MG PO TABS: 8.0000 mg | ORAL_TABLET | Freq: Two times a day (BID) | ORAL | 2 refills | Status: DC | PRN

## 2017-07-31 MED ORDER — PROCHLORPERAZINE MALEATE 10 MG PO TABS: 10.0000 mg | ORAL_TABLET | Freq: Four times a day (QID) | ORAL | 2 refills | Status: DC | PRN

## 2017-08-01 ENCOUNTER — Ambulatory Visit: Payer: Self-pay | Admitting: General Surgery

## 2017-08-01 ENCOUNTER — Ambulatory Visit
Admission: RE | Admit: 2017-08-01 | Discharge: 2017-08-01 | Disposition: A | Discharge status: Home / Self Care | Payer: Medicare Other | Source: Ambulatory Visit | Attending: Oncology | Admitting: Oncology

## 2017-08-01 ENCOUNTER — Telehealth: Payer: Self-pay

## 2017-08-01 DIAGNOSIS — R918 Other nonspecific abnormal finding of lung field: Secondary | ICD-10-CM | POA: Insufficient documentation

## 2017-08-01 DIAGNOSIS — C50912 Malignant neoplasm of unspecified site of left female breast: Secondary | ICD-10-CM

## 2017-08-01 DIAGNOSIS — C50512 Malignant neoplasm of lower-outer quadrant of left female breast: Secondary | ICD-10-CM | POA: Insufficient documentation

## 2017-08-01 DIAGNOSIS — C773 Secondary and unspecified malignant neoplasm of axilla and upper limb lymph nodes: Secondary | ICD-10-CM | POA: Diagnosis not present

## 2017-08-01 LAB — POCT I-STAT CREATININE: Creatinine, Ser: 0.9 mg/dL (ref 0.44–1.00)

## 2017-08-01 MED ORDER — IOPAMIDOL (ISOVUE-300) INJECTION 61%
100.0000 mL | Freq: Once | INTRAVENOUS | Status: AC | PRN
  Administered 2017-08-01: 100 mL via INTRAVENOUS

## 2017-08-01 NOTE — Telephone Encounter (Signed)
The patient is scheduled for a port placement at [facility] on 08/07/17. She will pre admit by phone. Surgery instructions reviewed with patient and mailed to her. The patient is aware of date and instructions.

## 2017-08-02 ENCOUNTER — Ambulatory Visit
Admission: RE | Admit: 2017-08-02 | Discharge: 2017-08-02 | Disposition: A | Discharge status: Home / Self Care | Payer: Medicare Other | Source: Ambulatory Visit | Attending: Oncology | Admitting: Oncology

## 2017-08-02 ENCOUNTER — Other Ambulatory Visit: Payer: Self-pay

## 2017-08-02 ENCOUNTER — Encounter
Admission: RE | Admit: 2017-08-02 | Discharge: 2017-08-02 | Disposition: A | Discharge status: Home / Self Care | Payer: Medicare Other | Source: Ambulatory Visit

## 2017-08-02 DIAGNOSIS — Z79899 Other long term (current) drug therapy: Secondary | ICD-10-CM | POA: Insufficient documentation

## 2017-08-02 DIAGNOSIS — C50919 Malignant neoplasm of unspecified site of unspecified female breast: Secondary | ICD-10-CM | POA: Diagnosis not present

## 2017-08-02 DIAGNOSIS — Z5111 Encounter for antineoplastic chemotherapy: Secondary | ICD-10-CM | POA: Diagnosis not present

## 2017-08-02 DIAGNOSIS — C50912 Malignant neoplasm of unspecified site of left female breast: Secondary | ICD-10-CM | POA: Diagnosis not present

## 2017-08-02 DIAGNOSIS — Z5181 Encounter for therapeutic drug level monitoring: Secondary | ICD-10-CM

## 2017-08-02 HISTORY — DX: Depression, unspecified: F32.A

## 2017-08-02 HISTORY — DX: Tachycardia, unspecified: R00.0

## 2017-08-02 HISTORY — DX: Hypothyroidism, unspecified: E03.9

## 2017-08-02 HISTORY — DX: Major depressive disorder, single episode, unspecified: F32.9

## 2017-08-02 MED ORDER — TECHNETIUM TC 99M-LABELED RED BLOOD CELLS IV KIT
23.7500 | PACK | Freq: Once | INTRAVENOUS | Status: AC | PRN
  Administered 2017-08-02: 23.75 via INTRAVENOUS

## 2017-08-02 NOTE — Patient Instructions (Signed)

## 2017-08-02 NOTE — Patient Instructions (Addendum)
Your procedure is scheduled on: Wed 08/07/17 Report to [place]. To find out your arrival time please call [phone] between 1PM - 3PM on Tue 08/06/17.  Remember: Instructions that are not followed completely may result in serious medical risk, up to and including death, or upon the discretion of your surgeon and anesthesiologist your surgery may need to be rescheduled.     _X__ 1. Do not eat food after midnight the night before your procedure.                 No gum chewing or hard candies. You may drink clear liquids up to 2 hours                 before you are scheduled to arrive for your surgery- DO not drink clear                 liquids within 2 hours of the start of your surgery.                 Clear Liquids include:  water, apple juice without pulp, clear carbohydrate                 drink such as Clearfast or Gatorade, Black Coffee or Tea (Do not add                 anything to coffee or tea).  __X__2.  On the morning of surgery brush your teeth with toothpaste and water, you                 may rinse your mouth with mouthwash if you wish.  Do not swallow any              toothpaste of mouthwash.     _X__ 3.  No Alcohol for 24 hours before or after surgery.   _X__ 4.  Do Not Smoke or use e-cigarettes For 24 Hours Prior to Your Surgery.                 Do not use any chewable tobacco products for at least 6 hours prior to                 surgery.  ____  5.  Bring all medications with you on the day of surgery if instructed.   __X__  6.  Notify your doctor if there is any change in your medical condition      (cold, fever, infections).     Do not wear jewelry, make-up, hairpins, clips or nail polish. Do not wear lotions, powders, or perfumes.  Do not shave 48 hours prior to surgery. Men may shave face and neck. Do not bring valuables to the hospital.    [facility] is not responsible for any belongings or  valuables.  Contacts, dentures/partials or body piercings may not be worn into surgery. Bring a case for your contacts, glasses or hearing aids, a denture cup will be supplied. Leave your suitcase in the car. After surgery it may be brought to your room. For patients admitted to the hospital, discharge time is determined by your treatment team.   Patients discharged the day of surgery will not be allowed to drive home.   Please read over the following fact sheets that you were given:   MRSA Information  __X__ Take these medicines the morning of surgery with A SIP OF WATER:  1. levothyroxine  2. alprazolem  3.   4.  5.  6.  ____ Fleet Enema (as directed)   __X__ Use CHG Soap/SAGE wipes as directed  ____ Use inhalers on the day of surgery  ____ Stop metformin/Janumet/Farxiga 2 days prior to surgery    ____ Take 1/2 of usual insulin dose the night before surgery. No insulin the morning          of surgery.   ____ Stop Blood Thinners Coumadin/Plavix/Xarelto/Pleta/Pradaxa/Eliquis/Effient/Aspirin  on   Or contact your Surgeon, Cardiologist or Medical Doctor regarding  ability to stop your blood thinners  __X__ Stop Anti-inflammatories 7 days before surgery such as Advil, Ibuprofen, Motrin,  BC or Goodies Powder, Naprosyn, Naproxen, Aleve, Aspirin    __X__ Stop all herbal supplements, fish oil or vitamin E until after surgery.    ____ Bring C-Pap to the hospital.

## 2017-08-05 ENCOUNTER — Inpatient Hospital Stay: Payer: Medicare Other

## 2017-08-05 NOTE — Progress Notes (Signed)
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  INTERVAL HISTORY: Patient returns to clinic today for further evaluation and consideration of cycle 1 of 4 of Adriamycin and Cytoxan.  She continues to be highly anxious, but otherwise feels well. She has no neurologic complaints.  She denies any recent fevers or illnesses.  She has a good appetite and denies weight loss.  She has no chest pain or shortness of breath.  She denies any nausea, vomiting, constipation, or diarrhea.  She has no urinary complaints.  Patient offers no further specific complaints today.  REVIEW OF SYSTEMS:   Review of Systems  Constitutional: Negative.  Negative for fever, malaise/fatigue and weight loss.  Respiratory: Negative.  Negative for cough and shortness of breath.   Cardiovascular: Negative.  Negative for chest pain and leg swelling.  Gastrointestinal: Negative.  Negative for abdominal pain and constipation.  Genitourinary: Negative.  Negative for dysuria.  Musculoskeletal: Negative.  Negative for back pain.  Skin: Negative.  Negative for rash.  Neurological: Negative.  Negative for focal weakness, weakness and headaches.  Psychiatric/Behavioral: The patient is nervous/anxious.     As per HPI. Otherwise, a complete review of systems is negative.  PAST MEDICAL HISTORY: Past Medical History:  Diagnosis Date  . Anxiety   . Depression   . Hypertension   . Hypothyroidism   . Rapid heart rate   . Thyroid disease     PAST SURGICAL HISTORY: Past Surgical History:  Procedure Laterality Date  . AXILLARY LYMPH NODE BIOPSY Left 07/16/2017    METASTATIC MAMMARY CARCINOMA  . BREAST BIOPSY Left 07/16/2017   [place] bx of left breast mass and left breast LN.  INVASIVE MAMMARY CARCINOMA, NO SPECIAL TYPE.   Marland Kitchen BREAST EXCISIONAL BIOPSY Right 2001  . BREAST LUMPECTOMY Right    2001  . COLONOSCOPY    . PORTACATH PLACEMENT Right 08/07/2017   Procedure: INSERTION PORT-A-CATH;  Surgeon: Robert Bellow, MD;  Location: [facility] ORS;  Service: General;  Laterality: Right;  . TONSILLECTOMY      FAMILY HISTORY: Family History  Problem Relation Age of Onset  . Stroke Mother   . Thyroid disease Mother   . Renal Disease Mother   . Stroke Father   . Heart attack Father   . Sudden death Father 54       suicide  . Anuerysm Brother   . Breast cancer Neg Hx     ADVANCED DIRECTIVES (Y/N):  N  HEALTH MAINTENANCE: Social History   Tobacco Use  . Smoking status: Current Some Day Smoker    Packs/day: 0.50    Years: 30.00    Pack years: 15.00    Types: Cigarettes  . Smokeless tobacco: Never Used  Substance Use Topics  . Alcohol use: Yes    Comment: occasional q 56mo  . Drug use: Never     Colonoscopy:  PAP:  Bone density:  Lipid panel:  No Known Allergies  Current Outpatient Medications  Medication Sig Dispense Refill  . ALPRAZolam (XANAX) 0.25 MG tablet Take 0.25 mg by mouth 2 (two) times daily.     .Marland KitchenamLODipine (NORVASC) 5 MG tablet Take 5 mg by mouth  daily.    . aspirin EC 81 MG tablet Take 81 mg by mouth daily.    Marland Kitchen HYDROcodone-acetaminophen (NORCO/VICODIN) 5-325 MG tablet Take 1 tablet by mouth every 4 (four) hours as needed for moderate pain. 10 tablet 0  . levothyroxine (SYNTHROID, LEVOTHROID) 100 MCG tablet Take 100 mcg by mouth daily before breakfast.    . lidocaine-prilocaine (EMLA) cream Apply to affected area once (Patient taking differently: Apply 1 application topically once. Used when port is accessed) 30 g 3  . LYSINE PO Take by mouth as needed.    . ondansetron (ZOFRAN) 8 MG tablet Take 1 tablet (8 mg total) by  mouth 2 (two) times daily as needed. Start on the third day after chemotherapy. 30 tablet 2  . Potassium 99 MG TABS Take 99 mg by mouth daily.    . prochlorperazine (COMPAZINE) 10 MG tablet TAKE 1 TABLET(10 MG) BY MOUTH EVERY 6 HOURS AS NEEDED FOR NAUSEA OR VOMITING 360 tablet 2  . traZODone (DESYREL) 50 MG tablet Take 50 mg by mouth at bedtime.     No current facility-administered medications for this visit.    Facility-Administered Medications Ordered in Other Visits  Medication Dose Route Frequency Provider Last Rate Last Dose  . 0.9 %  sodium chloride infusion   Intravenous Once Lloyd Huger, MD      . cyclophosphamide (CYTOXAN) 1,300 mg in sodium chloride 0.9 % 250 mL chemo infusion  600 mg/m2 (Treatment Plan Recorded) Intravenous Once Lloyd Huger, MD      . DOXOrubicin (ADRIAMYCIN) chemo injection 130 mg  60 mg/m2 (Treatment Plan Recorded) Intravenous Once Lloyd Huger, MD      . fosaprepitant (EMEND) 150 mg, dexamethasone (DECADRON) 12 mg in sodium chloride 0.9 % 145 mL IVPB   Intravenous Once Lloyd Huger, MD      . heparin lock flush 100 unit/mL  500 Units Intravenous Once Lloyd Huger, MD      . heparin lock flush 100 unit/mL  500 Units Intracatheter Once PRN Lloyd Huger, MD      . palonosetron (ALOXI) injection 0.25 mg  0.25 mg Intravenous Once Lloyd Huger, MD      . sodium chloride flush (NS) 0.9 % injection 10 mL  10 mL Intracatheter PRN Lloyd Huger, MD        OBJECTIVE: Vitals:   08/08/17 0831 08/08/17 0835  BP:  100/65  Pulse:  67  Resp: 12   Temp:  98.2 F (36.8 C)     Body mass index is 36.11 kg/m.    ECOG FS:0 - Asymptomatic  General: Well-developed, well-nourished, no acute distress. Eyes: Pink conjunctiva, anicteric sclera. HEENT: Normocephalic, moist mucous membranes, clear oropharnyx. Breast: Easily palpable 2 to 3 cm left breast mass. Lungs: Clear to auscultation bilaterally. Heart: Regular rate and  rhythm. No rubs, murmurs, or gallops. Abdomen: Soft, nontender, nondistended. No organomegaly noted, normoactive bowel sounds. Musculoskeletal: No edema, cyanosis, or clubbing. Neuro: Alert, answering all questions appropriately. Cranial nerves grossly intact. Skin: No rashes or petechiae noted. Psych: Normal affect.  LAB RESULTS:  Lab Results  Component Value Date   NA 140 08/08/2017   K 4.0 08/08/2017   CL 110 08/08/2017   CO2 20 (L) 08/08/2017   GLUCOSE 118 (H) 08/08/2017   BUN 16 08/08/2017   CREATININE 0.98 08/08/2017   CALCIUM 8.8 (L) 08/08/2017   PROT 6.5 08/08/2017   ALBUMIN 3.7 08/08/2017   AST 27 08/08/2017   ALT  26 08/08/2017   ALKPHOS 125 08/08/2017   BILITOT 0.7 08/08/2017   GFRNONAA 58 (L) 08/08/2017   GFRAA >60 08/08/2017    Lab Results  Component Value Date   WBC 7.1 08/08/2017   NEUTROABS 4.7 08/08/2017   HGB 13.0 08/08/2017   HCT 38.2 08/08/2017   MCV 87.4 08/08/2017   PLT 219 08/08/2017     STUDIES: Ct Chest W Contrast  Result Date: 08/02/2017 CLINICAL DATA:  Newly diagnosed left breast cancer EXAM: CT CHEST, ABDOMEN, AND PELVIS WITH CONTRAST TECHNIQUE: Multidetector CT imaging of the chest, abdomen and pelvis was performed following the standard protocol during bolus administration of intravenous contrast. CONTRAST:  110m ISOVUE-300 IOPAMIDOL (ISOVUE-300) INJECTION 61% COMPARISON:  None. FINDINGS: CT CHEST FINDINGS Cardiovascular: Heart is normal in size.  No pericardial effusion. No evidence of thoracic aortic aneurysm. Mild atherosclerotic calcifications of the aortic arch. Mediastinum/Nodes: Lower outer left breast mass with indwelling surgical clip (series 2/image 30), corresponding to the patient's known primary breast neoplasm. Multiple left axillary lymph nodes (series 2/images 10-20), measuring up to 18 mm short axis, compatible with nodal metastases. No suspicious mediastinal, hilar, or axillary lymphadenopathy. Lungs/Pleura: Mild centrilobular  and paraseptal emphysematous changes. 9 mm calcified granuloma in the posterior right lower lobe (series 3/image 112), benign. 4 mm pleural-based nodule in the anterior right lower lobe along the major fissure (series 3/image 101), likely benign given the perifissural location. Additional 3 x 6 mm flat nodule along the lateral right lower lobe (series 3/image 105), also pleural-based and likely benign. No suspicious pulmonary nodules. Mild compressive atelectasis along the medial right lower lobe. No focal consolidation. No pleural effusion or pneumothorax. Musculoskeletal: Mild degenerative changes of the lower thoracic spine. CT ABDOMEN PELVIS FINDINGS Hepatobiliary: Liver is within normal limits. Gallbladder is unremarkable. No intrahepatic or extrahepatic ductal dilatation. Pancreas: Within normal limits. Spleen: Within normal limits. Adrenals/Urinary Tract: Adrenal glands are within normal limits. 1.6 cm cyst in the posterior left upper kidney (series 9/image 11). 4.3 cm cyst in the lateral left upper kidney (series 9/image 16). Right kidney is within normal limits. No hydronephrosis. Bladder is within normal limits. Stomach/Bowel: Stomach is within normal limits. No evidence of bowel obstruction. Normal appendix (series 2/image 87). Mild left colonic diverticulosis, without evidence of diverticulitis. Vascular/Lymphatic: No evidence of abdominal aortic aneurysm. Mild atherosclerotic calcifications the bilateral iliac arteries. No suspicious abdominopelvic lymphadenopathy. Reproductive: Uterus is within normal limits. Bilateral ovaries are within normal limits. Other: No abdominopelvic ascites. Musculoskeletal: Mild degenerative changes of the lumbar spine. IMPRESSION: Lower outer left breast mass, corresponding to the patient's known primary breast neoplasm. Left axillary nodal metastases, measuring up to 1.8 cm short axis. Otherwise, no findings suspicious for metastatic disease. Two pleural-based nodules  measuring up to 5 mm (mean diameter), as described above, not considered suspicious for metastases. Consider six-month follow-up CT chest to confirm stability, as clinically warranted. Please note that Fleischner Society guidelines do not apply. Electronically Signed   By: SJulian HyM.D.   On: 08/02/2017 08:50   Nm Cardiac Muga Rest  Result Date: 08/02/2017 CLINICAL DATA:  LEFT breast cancer, pre cardiotoxic chemotherapy EXAM: NUCLEAR MEDICINE CARDIAC BLOOD POOL IMAGING (MUGA) TECHNIQUE: Cardiac multi-gated acquisition was performed at rest following intravenous injection of Tc-989mabeled red blood cells. RADIOPHARMACEUTICALS:  23.75 mCi [id number] in-vitro labeled red blood cells IV COMPARISON:  None FINDINGS: Calculated LEFT ventricular ejection fraction is 61%, normal. Study was obtained at a cardiac rate of 64 bpm. Patient was mildly a rhythmic  during imaging. Cine analysis of the LEFT ventricle in 3 projections demonstrates no focal LEFT ventricular wall motion abnormalities. IMPRESSION: Normal LEFT ventricular ejection fraction of 61%. Normal LEFT ventricular wall motion. Electronically Signed   By: Lavonia Dana M.D.   On: 08/02/2017 15:45   Ct Abdomen Pelvis W Contrast  Result Date: 08/02/2017 CLINICAL DATA:  Newly diagnosed left breast cancer EXAM: CT CHEST, ABDOMEN, AND PELVIS WITH CONTRAST TECHNIQUE: Multidetector CT imaging of the chest, abdomen and pelvis was performed following the standard protocol during bolus administration of intravenous contrast. CONTRAST:  167m ISOVUE-300 IOPAMIDOL (ISOVUE-300) INJECTION 61% COMPARISON:  None. FINDINGS: CT CHEST FINDINGS Cardiovascular: Heart is normal in size.  No pericardial effusion. No evidence of thoracic aortic aneurysm. Mild atherosclerotic calcifications of the aortic arch. Mediastinum/Nodes: Lower outer left breast mass with indwelling surgical clip (series 2/image 30), corresponding to the patient's known primary breast  neoplasm. Multiple left axillary lymph nodes (series 2/images 10-20), measuring up to 18 mm short axis, compatible with nodal metastases. No suspicious mediastinal, hilar, or axillary lymphadenopathy. Lungs/Pleura: Mild centrilobular and paraseptal emphysematous changes. 9 mm calcified granuloma in the posterior right lower lobe (series 3/image 112), benign. 4 mm pleural-based nodule in the anterior right lower lobe along the major fissure (series 3/image 101), likely benign given the perifissural location. Additional 3 x 6 mm flat nodule along the lateral right lower lobe (series 3/image 105), also pleural-based and likely benign. No suspicious pulmonary nodules. Mild compressive atelectasis along the medial right lower lobe. No focal consolidation. No pleural effusion or pneumothorax. Musculoskeletal: Mild degenerative changes of the lower thoracic spine. CT ABDOMEN PELVIS FINDINGS Hepatobiliary: Liver is within normal limits. Gallbladder is unremarkable. No intrahepatic or extrahepatic ductal dilatation. Pancreas: Within normal limits. Spleen: Within normal limits. Adrenals/Urinary Tract: Adrenal glands are within normal limits. 1.6 cm cyst in the posterior left upper kidney (series 9/image 11). 4.3 cm cyst in the lateral left upper kidney (series 9/image 16). Right kidney is within normal limits. No hydronephrosis. Bladder is within normal limits. Stomach/Bowel: Stomach is within normal limits. No evidence of bowel obstruction. Normal appendix (series 2/image 87). Mild left colonic diverticulosis, without evidence of diverticulitis. Vascular/Lymphatic: No evidence of abdominal aortic aneurysm. Mild atherosclerotic calcifications the bilateral iliac arteries. No suspicious abdominopelvic lymphadenopathy. Reproductive: Uterus is within normal limits. Bilateral ovaries are within normal limits. Other: No abdominopelvic ascites. Musculoskeletal: Mild degenerative changes of the lumbar spine. IMPRESSION: Lower outer  left breast mass, corresponding to the patient's known primary breast neoplasm. Left axillary nodal metastases, measuring up to 1.8 cm short axis. Otherwise, no findings suspicious for metastatic disease. Two pleural-based nodules measuring up to 5 mm (mean diameter), as described above, not considered suspicious for metastases. Consider six-month follow-up CT chest to confirm stability, as clinically warranted. Please note that Fleischner Society guidelines do not apply. Electronically Signed   By: SJulian HyM.D.   On: 08/02/2017 08:50   Dg Chest Port 1 View  Result Date: 08/07/2017 CLINICAL DATA:  Breast cancer and status post Port-A-Cath placement. EXAM: PORTABLE CHEST 1 VIEW COMPARISON:  Chest radiograph 02/17/2015 FINDINGS: Right subclavian Port-A-Cath has been placed. Catheter tip in the expected region of the SVC. Negative for a pneumothorax. Lungs are clear. Heart size is normal. IMPRESSION: Right subclavian Port-A-Cath.  Catheter tip in the SVC. Negative for pneumothorax. Electronically Signed   By: AMarkus DaftM.D.   On: 08/07/2017 15:44   Dg C-arm 1-60 Min-no Report  Result Date: 08/07/2017 Fluoroscopy was utilized by the  requesting physician.  No radiographic interpretation.   Mm Clip Placement Left  Result Date: 07/16/2017 CLINICAL DATA:  Ultrasound-guided biopsies of a palpable suspicious left breast mass and an enlarged left axillary lymph node were performed today. EXAM: DIAGNOSTIC LEFT MAMMOGRAM POST ULTRASOUND BIOPSIES COMPARISON:  Previous exam(s). FINDINGS: Mammographic images were obtained following ultrasound guided biopsy of a left breast mass at 2 o'clock position and a suspicious enlarged left axillary lymph node. Coil shaped biopsy clip is satisfactorily positioned within the biopsied mass. Lidocaine/biopsy changes are seen anterior to the enlarged biopsied lymph node, but the biopsy clip is not included on the image. Based on the ultrasound images at the end of the  axillary lymph node biopsy, it is felt that the biopsy clip is on the deep side of the biopsied lymph node. IMPRESSION: Satisfactory position of coil shaped biopsy clip in the left breast mass. The biopsy clip placed within left axillary lymph node is not visible on the mammogram. Final Assessment: Post Procedure Mammograms for Marker Placement Electronically Signed   By: Curlene Dolphin M.D.   On: 07/16/2017 14:20   [place] Lt Breast Bx W Loc Dev 1st Lesion Img Bx Spec US Guide  Addendum Date: 07/23/2017   ADDENDUM REPORT: 07/23/2017 13:32 ADDENDUM: Pathology of the left breast biopsy revealed A. BREAST, LEFT, 2:30, 5 CM FROM NIPPLE; ULTRASOUND-GUIDED CORE BIOPSY: INVASIVE MAMMARY CARCINOMA, NO SPECIAL TYPE. Size of invasive carcinoma: 15 mm in this sample. Histologic grade of invasive carcinoma: Grade 2. Ductal carcinoma in situ: Present, focal. Lymphovascular invasion: Not identified. ER/PR/HER2: Immunohistochemistry will be performed on block A1, with reflex to [place] for HER2 2+. The results will be reported in an addendum. Comment: The definitive grade will be assigned on the excisional specimen. These findings were communicated to [facility] in Dr. Landis Gandy' office on 07/17/2017 at 2:50 PM. Read back procedure was performed. This was found to be concordant by Dr. Radford Pax. Recommendation: Surgical and oncology referrals. The patient has an appointment scheduled with Dr. Bary Castilla for 08/01/17. At the patient's request, results and recommendations were relayed to the patient by phone by Dr. Miquel Dunn on 07/19/17. The patient stated she did well following the biopsy. Post biopsy instructions were reviewed with the patient and all of her questions were answered. She was encouraged to contact the [facility] with any further questions or concerns. The patient stated Dr. Lavera Guise was awaiting the results of the biopsy and would contact Dr. Dwyane Luo office to move up her appointment if the results were positive. Dr.  Jennette Kettle office was closed at the time of conversation. Dr. Jennette Kettle office was contacted by phone by Jetta Lout, [place] on 07/22/17 with results. A copy of the pathology report was also faxed to Dr. Jennette Kettle office. They will contact Dr. Dwyane Luo office after Dr. Lavera Guise has reviewed the report. Addendum by Jetta Lout, RRA on 07/23/17. Electronically Signed   By: Curlene Dolphin M.D.   On: 07/23/2017 13:32   Result Date: 07/23/2017 CLINICAL DATA:  Ultrasound-guided core needle biopsy was recommended of a palpable mass in the 2:30 position of the left breast. EXAM: ULTRASOUND GUIDED LEFT BREAST CORE NEEDLE BIOPSY COMPARISON:  Previous exam(s). FINDINGS: I met with the patient and we discussed the procedure of ultrasound-guided biopsy, including benefits and alternatives. We discussed the high likelihood of a successful procedure. We discussed the risks of the procedure, including infection, bleeding, tissue injury, clip migration, and inadequate sampling. Informed written consent was given. The usual time-out protocol was performed immediately prior  to the procedure. Lesion quadrant: Upper outer quadrant Using sterile technique and 1% Lidocaine as local anesthetic, under direct ultrasound visualization, a 12 gauge spring-loaded device was used to perform biopsy of a palpable irregular hypoechoic mass centered at 2:30 position 5 cm from the nipple using a lateral approach. At the conclusion of the procedure a coil tissue marker clip was deployed into the biopsy cavity. Follow up 2 view mammogram was performed and dictated separately. IMPRESSION: Ultrasound guided biopsy of the left breast. No apparent complications. Electronically Signed: By: Curlene Dolphin M.D. On: 07/16/2017 14:22   [place] Lt Breast Bx W Loc Dev Ea Add Lesion Img Bx Spec US Guide  Addendum Date: 07/23/2017   ADDENDUM REPORT: 07/23/2017 13:25 ADDENDUM: Pathology of the left axillary lymph node revealed B. LYMPH NODE, LEFT AXILLA; ULTRASOUND-GUIDED CORE  BIOPSY: METASTATIC MAMMARY CARCINOMA, MEASURING AT LEAST 9 MM. This was found to be concordant by Dr. Radford Pax. Recommendation: Surgical and oncology referrals. The patient has an appointment scheduled with Dr. Bary Castilla for 08/01/17. At the patient's request, results and recommendations were relayed to the patient by phone by Dr. Miquel Dunn on 07/19/17. The patient stated she did well following the biopsy. Post biopsy instructions were reviewed with the patient and all of her questions were answered. She was encouraged to contact the [facility] with any further questions or concerns. The patient told Dr. Miquel Dunn that Dr. Lavera Guise was awaiting the results of the biopsy and would move her appointment up with Dr. Bary Castilla if the results were positive. His office was closed at the time of conversation with Dr. Miquel Dunn. Jetta Lout, RRA contacted Dr. Jennette Kettle office by phone on Monday, 07/22/17 and also faxed a copy of the pathology report to his office. They will contact Dr. Dwyane Luo office after Dr. Lavera Guise has reviewed the results. Addendum by Jetta Lout, RRA on 07/23/17. Electronically Signed   By: Curlene Dolphin M.D.   On: 07/23/2017 13:25   Result Date: 07/23/2017 CLINICAL DATA:  Ultrasound-guided core needle biopsy was recommended of an enlarged suspicious left axillary lymph node. EXAM: [place] AXILLARY NODE CORE BIOPSY LEFT COMPARISON:  Previous exam(s). FINDINGS: I met with the patient and we discussed the procedure of ultrasound-guided biopsy, including benefits and alternatives. We discussed the high likelihood of a successful procedure. We discussed the risks of the procedure, including infection, bleeding, tissue injury, clip migration, and inadequate sampling. Informed written consent was given. The usual time-out protocol was performed immediately prior to the procedure. Using sterile technique and 1% Lidocaine as local anesthetic, under direct ultrasound visualization, a 14 gauge spring-loaded device was used to  perform biopsy of an enlarged left axillary lymph node using a lateral approach. At the conclusion of the procedure a HydroMARK tissue marker clip was deployed into the biopsy cavity. Follow up 2 view mammogram was performed and dictated separately. IMPRESSION: Ultrasound guided biopsy of a left axillary lymph node. No apparent complications. Electronically Signed: By: Curlene Dolphin M.D. 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  PLAN:    1. 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  Case was also discussed extensively at case conference.  Given the size and the stage of patient's malignancy, she will benefit from neoadjuvant chemotherapy using Adriamycin, Cytoxan, and Taxol.  Patient will also require Neulasta support.  Patient will likely require adjuvant XRT followed by an aromatase inhibitor for 5 years at the conclusion of her treatments.  CT scan of the chest, abdomen, pelvis did not reveal any metastatic disease.  MUGA scan revealed EF of 61% which is adequate to proceed with treatment.  Proceed with cycle 1 of 4 of Adriamycin and Cytoxan.  Return to clinic tomorrow for Neulasta, 1 week for laboratory work only, and then in 2 weeks for further evaluation and consideration of cycle 2.    I spent a total of 30 minutes face-to-face with the patient of which greater than 50% of the visit was spent in counseling and coordination of care as detailed above.    Patient expressed understanding and was in agreement with this plan. She also understands that She can call clinic at any time with any questions, concerns, or complaints.   Cancer Staging Breast cancer, stage 2, left ([place]) Staging form: Breast, AJCC 8th Edition - Clinical stage from 07/30/2017: Stage IIA (cT2, cN1,  cM0, G2, ER+, PR+, HER2-) - Signed by Lloyd Huger, MD on 07/30/2017   Lloyd Huger, MD   08/08/2017 9:19 AM

## 2017-08-06 MED ORDER — CEFAZOLIN SODIUM-DEXTROSE 2-4 GM/100ML-% IV SOLN
2.0000 g | INTRAVENOUS | Status: AC
  Administered 2017-08-07: 2 g via INTRAVENOUS

## 2017-08-07 ENCOUNTER — Ambulatory Visit
Admission: RE | Admit: 2017-08-07 | Discharge: 2017-08-07 | Disposition: A | Discharge status: Home / Self Care | Payer: Medicare Other | Source: Ambulatory Visit | Attending: General Surgery | Admitting: General Surgery

## 2017-08-07 ENCOUNTER — Ambulatory Visit: Payer: Medicare Other | Admitting: Certified Registered"

## 2017-08-07 ENCOUNTER — Ambulatory Visit: Payer: Medicare Other

## 2017-08-07 ENCOUNTER — Encounter
Admission: RE | Disposition: A | Discharge status: Home / Self Care | Payer: Self-pay | Source: Ambulatory Visit | Attending: General Surgery

## 2017-08-07 ENCOUNTER — Other Ambulatory Visit: Payer: Self-pay

## 2017-08-07 ENCOUNTER — Other Ambulatory Visit: Payer: Medicare Other

## 2017-08-07 DIAGNOSIS — Z170 Estrogen receptor positive status [ER+]: Secondary | ICD-10-CM | POA: Insufficient documentation

## 2017-08-07 DIAGNOSIS — E039 Hypothyroidism, unspecified: Secondary | ICD-10-CM | POA: Insufficient documentation

## 2017-08-07 DIAGNOSIS — F17210 Nicotine dependence, cigarettes, uncomplicated: Secondary | ICD-10-CM | POA: Diagnosis not present

## 2017-08-07 DIAGNOSIS — F418 Other specified anxiety disorders: Secondary | ICD-10-CM | POA: Diagnosis not present

## 2017-08-07 DIAGNOSIS — Z79899 Other long term (current) drug therapy: Secondary | ICD-10-CM | POA: Diagnosis not present

## 2017-08-07 DIAGNOSIS — Z7982 Long term (current) use of aspirin: Secondary | ICD-10-CM | POA: Insufficient documentation

## 2017-08-07 DIAGNOSIS — F329 Major depressive disorder, single episode, unspecified: Secondary | ICD-10-CM | POA: Diagnosis not present

## 2017-08-07 DIAGNOSIS — C50911 Malignant neoplasm of unspecified site of right female breast: Secondary | ICD-10-CM | POA: Diagnosis not present

## 2017-08-07 DIAGNOSIS — F419 Anxiety disorder, unspecified: Secondary | ICD-10-CM | POA: Insufficient documentation

## 2017-08-07 DIAGNOSIS — C50912 Malignant neoplasm of unspecified site of left female breast: Secondary | ICD-10-CM

## 2017-08-07 DIAGNOSIS — I10 Essential (primary) hypertension: Secondary | ICD-10-CM | POA: Diagnosis not present

## 2017-08-07 DIAGNOSIS — C50919 Malignant neoplasm of unspecified site of unspecified female breast: Secondary | ICD-10-CM | POA: Diagnosis not present

## 2017-08-07 HISTORY — PX: PORTACATH PLACEMENT: SHX2246

## 2017-08-07 LAB — POCT I-STAT 4, (NA,K, GLUC, HGB,HCT)
Glucose, Bld: 94 mg/dL (ref 70–99)
HCT: 40 % (ref 36.0–46.0)
Hemoglobin: 13.6 g/dL (ref 12.0–15.0)
Potassium: 4.1 mmol/L (ref 3.5–5.1)
Sodium: 139 mmol/L (ref 135–145)

## 2017-08-07 SURGERY — INSERTION, TUNNELED CENTRAL VENOUS DEVICE, WITH PORT
Anesthesia: General | Laterality: Right | Wound class: Clean

## 2017-08-07 MED ORDER — ACETAMINOPHEN 325 MG PO TABS: 325.0000 mg | ORAL_TABLET | ORAL | Status: DC | PRN

## 2017-08-07 MED ORDER — EPHEDRINE SULFATE 50 MG/ML IJ SOLN
INTRAMUSCULAR | Status: AC
  Filled 2017-08-07: qty 1

## 2017-08-07 MED ORDER — FAMOTIDINE 20 MG PO TABS
20.0000 mg | ORAL_TABLET | Freq: Once | ORAL | Status: AC
  Administered 2017-08-07: 20 mg via ORAL

## 2017-08-07 MED ORDER — CEFAZOLIN SODIUM-DEXTROSE 2-4 GM/100ML-% IV SOLN
INTRAVENOUS | Status: AC
  Filled 2017-08-07: qty 100

## 2017-08-07 MED ORDER — PROMETHAZINE HCL 25 MG/ML IJ SOLN: 6.2500 mg | INTRAMUSCULAR | Status: DC | PRN

## 2017-08-07 MED ORDER — EPHEDRINE SULFATE 50 MG/ML IJ SOLN
INTRAMUSCULAR | Status: DC | PRN
  Administered 2017-08-07: 5 mg via INTRAVENOUS

## 2017-08-07 MED ORDER — HYDROCODONE-ACETAMINOPHEN 5-325 MG PO TABS: 1.0000 | ORAL_TABLET | ORAL | 0 refills | Status: DC | PRN

## 2017-08-07 MED ORDER — MEPERIDINE HCL 50 MG/ML IJ SOLN: 6.2500 mg | INTRAMUSCULAR | Status: DC | PRN

## 2017-08-07 MED ORDER — PHENYLEPHRINE HCL 10 MG/ML IJ SOLN
INTRAMUSCULAR | Status: DC | PRN
  Administered 2017-08-07 (×2): 100 ug via INTRAVENOUS

## 2017-08-07 MED ORDER — PROPOFOL 10 MG/ML IV BOLUS
INTRAVENOUS | Status: AC
  Filled 2017-08-07: qty 20

## 2017-08-07 MED ORDER — FENTANYL CITRATE (PF) 100 MCG/2ML IJ SOLN
INTRAMUSCULAR | Status: AC
  Filled 2017-08-07: qty 2

## 2017-08-07 MED ORDER — PROPOFOL 10 MG/ML IV BOLUS
INTRAVENOUS | Status: DC | PRN
  Administered 2017-08-07: 50 mg via INTRAVENOUS

## 2017-08-07 MED ORDER — LIDOCAINE HCL (PF) 1 % IJ SOLN
INTRAMUSCULAR | Status: AC
  Filled 2017-08-07: qty 30

## 2017-08-07 MED ORDER — LACTATED RINGERS IV SOLN
INTRAVENOUS | Status: DC
  Administered 2017-08-07: 12:00:00 via INTRAVENOUS

## 2017-08-07 MED ORDER — PROPOFOL 500 MG/50ML IV EMUL
INTRAVENOUS | Status: DC | PRN
  Administered 2017-08-07: 100 ug/kg/min via INTRAVENOUS

## 2017-08-07 MED ORDER — HYDROCODONE-ACETAMINOPHEN 7.5-325 MG PO TABS: 1.0000 | ORAL_TABLET | Freq: Once | ORAL | Status: DC | PRN

## 2017-08-07 MED ORDER — SODIUM CHLORIDE 0.9 % IJ SOLN
INTRAMUSCULAR | Status: AC
  Filled 2017-08-07: qty 50

## 2017-08-07 MED ORDER — ACETAMINOPHEN 160 MG/5ML PO SOLN
325.0000 mg | ORAL | Status: DC | PRN
  Filled 2017-08-07: qty 20.3

## 2017-08-07 MED ORDER — HYDROMORPHONE HCL 1 MG/ML IJ SOLN: 0.2500 mg | INTRAMUSCULAR | Status: DC | PRN

## 2017-08-07 MED ORDER — FENTANYL CITRATE (PF) 100 MCG/2ML IJ SOLN
INTRAMUSCULAR | Status: DC | PRN
  Administered 2017-08-07: 25 ug via INTRAVENOUS
  Administered 2017-08-07 (×2): 12.5 ug via INTRAVENOUS

## 2017-08-07 MED ORDER — FAMOTIDINE 20 MG PO TABS
ORAL_TABLET | ORAL | Status: AC
  Filled 2017-08-07: qty 1

## 2017-08-07 SURGICAL SUPPLY — 29 items
BENZOIN TINCTURE PRP APPL 2/3 (GAUZE/BANDAGES/DRESSINGS) ×2 IMPLANT
BLADE SURG 15 STRL SS SAFETY (BLADE) ×2 IMPLANT
CHLORAPREP W/TINT 26ML (MISCELLANEOUS) ×2 IMPLANT
COVER LIGHT HANDLE STERIS (MISCELLANEOUS) ×4 IMPLANT
DECANTER SPIKE VIAL GLASS SM (MISCELLANEOUS) ×4 IMPLANT
DRAPE C-ARM XRAY 36X54 (DRAPES) ×2 IMPLANT
DRAPE LAPAROTOMY TRNSV 106X77 (MISCELLANEOUS) ×2 IMPLANT
DRSG TEGADERM 2-3/8X2-3/4 SM (GAUZE/BANDAGES/DRESSINGS) ×2 IMPLANT
DRSG TEGADERM 4X4.75 (GAUZE/BANDAGES/DRESSINGS) ×2 IMPLANT
DRSG TELFA 4X3 1S NADH ST (GAUZE/BANDAGES/DRESSINGS) ×2 IMPLANT
ELECT REM PT RETURN 9FT ADLT (ELECTROSURGICAL) ×2
ELECTRODE REM PT RTRN 9FT ADLT (ELECTROSURGICAL) ×1 IMPLANT
GLOVE BIO SURGEON STRL SZ7.5 (GLOVE) ×2 IMPLANT
GLOVE INDICATOR 8.0 STRL GRN (GLOVE) ×2 IMPLANT
GOWN STRL REUS W/ TWL LRG LVL3 (GOWN DISPOSABLE) ×2 IMPLANT
GOWN STRL REUS W/TWL LRG LVL3 (GOWN DISPOSABLE) ×2
KIT PORT POWER 8FR ISP CVUE (Port) ×2 IMPLANT
KIT TURNOVER KIT A (KITS) ×2 IMPLANT
LABEL OR SOLS (LABEL) ×2 IMPLANT
NS IRRIG 500ML POUR BTL (IV SOLUTION) ×2 IMPLANT
PACK PORT-A-CATH (MISCELLANEOUS) ×2 IMPLANT
STRIP CLOSURE SKIN 1/2X4 (GAUZE/BANDAGES/DRESSINGS) ×2 IMPLANT
SUT PROLENE 3 0 SH DA (SUTURE) ×2 IMPLANT
SUT VIC AB 3-0 SH 27 (SUTURE) ×1
SUT VIC AB 3-0 SH 27X BRD (SUTURE) ×1 IMPLANT
SUT VIC AB 4-0 FS2 27 (SUTURE) ×2 IMPLANT
SWABSTK COMLB BENZOIN TINCTURE (MISCELLANEOUS) ×2 IMPLANT
SYR 10ML LL (SYRINGE) ×2 IMPLANT
SYR 10ML SLIP (SYRINGE) ×2 IMPLANT

## 2017-08-07 NOTE — OR Nursing (Signed)
Per Dr. Bary Castilla verbal in PACU, patient does not need to be seen by him in postop prior to discharge.

## 2017-08-07 NOTE — Transfer of Care (Signed)
Immediate Anesthesia Transfer of Care Note  Patient: Alicia [name] [facility]  Procedure(s) Performed: INSERTION PORT-A-CATH (Right )  Patient Location: PACU  Anesthesia Type:General  Level of Consciousness: awake, alert  and oriented  Airway & Oxygen Therapy: Patient Spontanous Breathing and Patient connected to face mask oxygen  Post-op Assessment: Report given to RN and Post -op Vital signs reviewed and stable  Post vital signs: Reviewed and stable  Last Vitals:  Vitals Value Taken Time  BP 99/57 08/07/2017  3:30 PM  Temp 36.2 C 08/07/2017  3:30 PM  Pulse 75 08/07/2017  3:30 PM  Resp 14 08/07/2017  3:30 PM  SpO2 94 % 08/07/2017  3:30 PM    Last Pain:  Vitals:   08/07/17 1200  TempSrc: Temporal  PainSc: 0-No pain         Complications: No apparent anesthesia complications

## 2017-08-07 NOTE — Anesthesia Postprocedure Evaluation (Signed)
Anesthesia Post Note  Patient: Alicia [name] [facility]  Procedure(s) Performed: INSERTION PORT-A-CATH (Right )  Patient location during evaluation: PACU Anesthesia Type: General Level of consciousness: awake and alert and oriented Pain management: pain level controlled Vital Signs Assessment: post-procedure vital signs reviewed and stable Respiratory status: spontaneous breathing, nonlabored ventilation and respiratory function stable Cardiovascular status: blood pressure returned to baseline and stable Postop Assessment: no signs of nausea or vomiting Anesthetic complications: no     Last Vitals:  Vitals:   08/07/17 1610 08/07/17 1635  BP: 130/72 126/75  Pulse: (!) 55 (!) 58  Resp: 16   Temp: (!) 36.1 C   SpO2: 98% 97%    Last Pain:  Vitals:   08/07/17 1635  TempSrc:   PainSc: 0-No pain                 Chalmers Iddings

## 2017-08-07 NOTE — H&P (Signed)
No change in clinical history or exam. For power port placement.

## 2017-08-07 NOTE — Anesthesia Preprocedure Evaluation (Addendum)
Anesthesia Evaluation  Patient identified by MRN, date of birth, ID band Patient awake    Reviewed: Allergy & Precautions, H&P , NPO status , reviewed documented beta blocker date and time   Airway Mallampati: II  TM Distance: >3 FB Neck ROM: full    Dental  (+) Caps, Teeth Intact   Pulmonary Current Smoker,    Pulmonary exam normal        Cardiovascular hypertension, Normal cardiovascular exam     Neuro/Psych PSYCHIATRIC DISORDERS Anxiety Depression    GI/Hepatic   Endo/Other  Hypothyroidism   Renal/GU      Musculoskeletal   Abdominal   Peds  Hematology   Anesthesia Other Findings Past Medical History: No date: Anxiety No date: Depression No date: Hypertension No date: Hypothyroidism No date: Rapid heart rate No date: Thyroid disease  Past Surgical History: 07/16/2017: AXILLARY LYMPH NODE BIOPSY; Left     Comment:  METASTATIC MAMMARY CARCINOMA 07/16/2017: BREAST BIOPSY; Left     Comment:  [place] bx of left breast mass and left breast LN.  INVASIVE               MAMMARY CARCINOMA, NO SPECIAL TYPE.  2001: BREAST EXCISIONAL BIOPSY; Right No date: BREAST LUMPECTOMY; Right     Comment:  2001 No date: COLONOSCOPY No date: TONSILLECTOMY  BMI    Body Mass Index:  35.72 kg/m      Reproductive/Obstetrics                            Anesthesia Physical Anesthesia Plan  ASA: III  Anesthesia Plan: General   Post-op Pain Management:    Induction:   PONV Risk Score and Plan: 3 and Treatment may vary due to age or medical condition, TIVA and Ondansetron  Airway Management Planned:   Additional Equipment:   Intra-op Plan:   Post-operative Plan:   Informed Consent: I have reviewed the patients History and Physical, chart, labs and discussed the procedure including the risks, benefits and alternatives for the proposed anesthesia with the patient or authorized representative who has  indicated his/her understanding and acceptance.   Dental Advisory Given  Plan Discussed with: CRNA  Anesthesia Plan Comments:        Anesthesia Quick Evaluation

## 2017-08-07 NOTE — Op Note (Signed)
Preoperative diagnosis: Stage II breast cancer, need for central venous access.  Postoperative diagnosis: Same.  Operative procedure: Right subclavian PowerPort placement with ultrasound and fluoroscopic guidance.  Operating Surgeon: Hervey Ard, MD.  Anesthesia: Attended local, 10 cc 1% plain Xylocaine.  Estimated blood loss: Less than 5 cc.  Clinical note: This 67 year old woman has been diagnosed with stage II cancer is a candidate for neoadjuvant chemotherapy.  Central venous access was requested by her treating oncologist.  The patient received Kefzol prior to the procedure.  Operative note: With the patient under adequate sedation and in Trendelenburg position the right neck chest and axilla was cleansed with ChloraPrep and draped.  Ultrasound was used to confirm patency of the right subclavian system.  Local anesthesia was infiltrated.  The vein was cannulated on a single stick.  Guidewire passage encountered some resistance and fluoroscopy showed that this had passed up the internal jugular vein.  This was manipulated into the SVC followed by passage of the peel-away sheath and dilator.  The catheter again had migrated into the internal jugular vein and it was necessary to replace the guidewire, place a new dilator system and then direct the catheter into the SVC under fluoroscopic guidance.  Once this was completed the tip was positioned at the junction of the SVC and right atrium.  The catheter easily irrigated and aspirated in this position.  It was tunneled to a pocket on the right anterior chest where the port was transfixed to the deep tissue with interrupted 3-0 Prolene sutures x2.  The adipose layer was closed with a running 3-0 Vicryl suture.  The skin was closed with a running 4-0 Vicryl subcuticular suture.  Benzoin Steri-Strips were applied to both wounds.  The port was cannulated and easily irrigated and aspirated.  It was flushed with 10 cc of injectable saline.  This was  left cannulated as she is due for chemotherapy treatment tomorrow.  The catheter was capped and both clamps were applied to minimize the risk for bleeding.  A Telfa and Tegaderm dressing  was applied.  Erect portable chest x-ray was completed in the recovery room and is pending at the time of dictation.

## 2017-08-07 NOTE — Anesthesia Post-op Follow-up Note (Signed)
Anesthesia QCDR form completed.        

## 2017-08-08 ENCOUNTER — Other Ambulatory Visit: Payer: Self-pay

## 2017-08-08 ENCOUNTER — Inpatient Hospital Stay: Payer: Medicare Other

## 2017-08-08 ENCOUNTER — Encounter: Payer: Self-pay | Admitting: *Deleted

## 2017-08-08 ENCOUNTER — Encounter: Payer: Self-pay | Admitting: Oncology

## 2017-08-08 ENCOUNTER — Inpatient Hospital Stay (HOSPITAL_BASED_OUTPATIENT_CLINIC_OR_DEPARTMENT_OTHER): Payer: Medicare Other | Admitting: Oncology

## 2017-08-08 VITALS — BP 100/65 | HR 67 | Temp 98.2°F | Resp 12 | Ht 66.0 in | Wt 223.7 lb

## 2017-08-08 DIAGNOSIS — C50412 Malignant neoplasm of upper-outer quadrant of left female breast: Secondary | ICD-10-CM | POA: Diagnosis not present

## 2017-08-08 DIAGNOSIS — C50912 Malignant neoplasm of unspecified site of left female breast: Secondary | ICD-10-CM

## 2017-08-08 DIAGNOSIS — Z170 Estrogen receptor positive status [ER+]: Secondary | ICD-10-CM

## 2017-08-08 DIAGNOSIS — I10 Essential (primary) hypertension: Secondary | ICD-10-CM

## 2017-08-08 DIAGNOSIS — Z5111 Encounter for antineoplastic chemotherapy: Secondary | ICD-10-CM | POA: Diagnosis not present

## 2017-08-08 DIAGNOSIS — F17210 Nicotine dependence, cigarettes, uncomplicated: Secondary | ICD-10-CM | POA: Diagnosis not present

## 2017-08-08 DIAGNOSIS — Z5189 Encounter for other specified aftercare: Secondary | ICD-10-CM | POA: Diagnosis not present

## 2017-08-08 LAB — COMPREHENSIVE METABOLIC PANEL WITH GFR
ALT: 26 U/L (ref 0–44)
AST: 27 U/L (ref 15–41)
Albumin: 3.7 g/dL (ref 3.5–5.0)
Alkaline Phosphatase: 125 U/L (ref 38–126)
Anion gap: 10 (ref 5–15)
BUN: 16 mg/dL (ref 8–23)
CO2: 20 mmol/L — ABNORMAL LOW (ref 22–32)
Calcium: 8.8 mg/dL — ABNORMAL LOW (ref 8.9–10.3)
Chloride: 110 mmol/L (ref 98–111)
Creatinine, Ser: 0.98 mg/dL (ref 0.44–1.00)
GFR calc Af Amer: >60 mL/min
GFR calc non Af Amer: 58 mL/min — ABNORMAL LOW
Glucose, Bld: 118 mg/dL — ABNORMAL HIGH (ref 70–99)
Potassium: 4 mmol/L (ref 3.5–5.1)
Sodium: 140 mmol/L (ref 135–145)
Total Bilirubin: 0.7 mg/dL (ref 0.3–1.2)
Total Protein: 6.5 g/dL (ref 6.5–8.1)

## 2017-08-08 LAB — CBC WITH DIFFERENTIAL/PLATELET
Basophils Absolute: 0.1 K/uL (ref 0–0.1)
Basophils Relative: 1 %
Eosinophils Absolute: 0.2 K/uL (ref 0–0.7)
Eosinophils Relative: 2 %
HCT: 38.2 % (ref 35.0–47.0)
Hemoglobin: 13 g/dL (ref 12.0–16.0)
Lymphocytes Relative: 21 %
Lymphs Abs: 1.5 K/uL (ref 1.0–3.6)
MCH: 29.8 pg (ref 26.0–34.0)
MCHC: 34.1 g/dL (ref 32.0–36.0)
MCV: 87.4 fL (ref 80.0–100.0)
Monocytes Absolute: 0.7 K/uL (ref 0.2–0.9)
Monocytes Relative: 9 %
Neutro Abs: 4.7 K/uL (ref 1.4–6.5)
Neutrophils Relative %: 67 %
Platelets: 219 K/uL (ref 150–440)
RBC: 4.37 MIL/uL (ref 3.80–5.20)
RDW: 14 % (ref 11.5–14.5)
WBC: 7.1 K/uL (ref 3.6–11.0)

## 2017-08-08 MED ORDER — SODIUM CHLORIDE 0.9% FLUSH
10.0000 mL | INTRAVENOUS | Status: DC | PRN
  Filled 2017-08-08: qty 10

## 2017-08-08 MED ORDER — SODIUM CHLORIDE 0.9 % IV SOLN
Freq: Once | INTRAVENOUS | Status: AC
  Administered 2017-08-08: 10:00:00 via INTRAVENOUS
  Filled 2017-08-08: qty 1000

## 2017-08-08 MED ORDER — HEPARIN SOD (PORK) LOCK FLUSH 100 UNIT/ML IV SOLN
500.0000 [IU] | Freq: Once | INTRAVENOUS | Status: AC
  Administered 2017-08-08: 500 [IU] via INTRAVENOUS

## 2017-08-08 MED ORDER — SODIUM CHLORIDE 0.9% FLUSH
10.0000 mL | Freq: Once | INTRAVENOUS | Status: AC
  Administered 2017-08-08: 10 mL via INTRAVENOUS
  Filled 2017-08-08: qty 10

## 2017-08-08 MED ORDER — SODIUM CHLORIDE 0.9 % IV SOLN
600.0000 mg/m2 | Freq: Once | INTRAVENOUS | Status: AC
  Administered 2017-08-08: 1300 mg via INTRAVENOUS
  Filled 2017-08-08: qty 50

## 2017-08-08 MED ORDER — PALONOSETRON HCL INJECTION 0.25 MG/5ML
0.2500 mg | Freq: Once | INTRAVENOUS | Status: AC
  Administered 2017-08-08: 0.25 mg via INTRAVENOUS
  Filled 2017-08-08: qty 5

## 2017-08-08 MED ORDER — DOXORUBICIN HCL CHEMO IV INJECTION 2 MG/ML
60.0000 mg/m2 | Freq: Once | INTRAVENOUS | Status: AC
  Administered 2017-08-08: 130 mg via INTRAVENOUS
  Filled 2017-08-08: qty 65

## 2017-08-08 MED ORDER — FOSAPREPITANT DIMEGLUMINE INJECTION 150 MG
Freq: Once | INTRAVENOUS | Status: AC
  Administered 2017-08-08: 10:00:00 via INTRAVENOUS
  Filled 2017-08-08: qty 5

## 2017-08-08 MED ORDER — HEPARIN SOD (PORK) LOCK FLUSH 100 UNIT/ML IV SOLN
500.0000 [IU] | Freq: Once | INTRAVENOUS | Status: DC | PRN
  Filled 2017-08-08: qty 5

## 2017-08-08 NOTE — Progress Notes (Signed)
  Oncology Nurse Navigator Documentation  Navigator Location: [facility] (08/08/17 1100)   )Navigator Encounter Type: Clinic/[facility] (08/08/17 1100)                       Treatment Phase: First Chemo Tx (08/08/17 1100) Barriers/Navigation Needs: No Needs (08/08/17 1100)                          Time Spent with Patient: 15 (08/08/17 1100)   Met patient and her daughter in law today during her first chemotherapy treatment.  Patient to schedule appointment with Elease Etienne, SW for for non-medical financial concerns.  She is to call with any questions or needs.

## 2017-08-08 NOTE — Progress Notes (Signed)
Patient here for first treatment. She is having some post op pain from port placement.

## 2017-08-09 ENCOUNTER — Encounter: Payer: Self-pay | Admitting: Oncology

## 2017-08-09 ENCOUNTER — Other Ambulatory Visit: Payer: Self-pay

## 2017-08-09 ENCOUNTER — Inpatient Hospital Stay: Payer: Medicare Other

## 2017-08-09 ENCOUNTER — Telehealth: Payer: Self-pay | Admitting: *Deleted

## 2017-08-09 ENCOUNTER — Inpatient Hospital Stay (HOSPITAL_BASED_OUTPATIENT_CLINIC_OR_DEPARTMENT_OTHER): Payer: Medicare Other | Admitting: Oncology

## 2017-08-09 VITALS — BP 136/75 | HR 86 | Temp 97.0°F | Resp 20 | Wt 226.9 lb

## 2017-08-09 DIAGNOSIS — C50412 Malignant neoplasm of upper-outer quadrant of left female breast: Secondary | ICD-10-CM

## 2017-08-09 DIAGNOSIS — Z5189 Encounter for other specified aftercare: Secondary | ICD-10-CM | POA: Diagnosis not present

## 2017-08-09 DIAGNOSIS — Z170 Estrogen receptor positive status [ER+]: Secondary | ICD-10-CM

## 2017-08-09 DIAGNOSIS — R062 Wheezing: Secondary | ICD-10-CM | POA: Diagnosis not present

## 2017-08-09 DIAGNOSIS — C50912 Malignant neoplasm of unspecified site of left female breast: Secondary | ICD-10-CM

## 2017-08-09 DIAGNOSIS — F17210 Nicotine dependence, cigarettes, uncomplicated: Secondary | ICD-10-CM | POA: Diagnosis not present

## 2017-08-09 DIAGNOSIS — I10 Essential (primary) hypertension: Secondary | ICD-10-CM | POA: Diagnosis not present

## 2017-08-09 DIAGNOSIS — J0100 Acute maxillary sinusitis, unspecified: Secondary | ICD-10-CM | POA: Diagnosis not present

## 2017-08-09 DIAGNOSIS — Z5111 Encounter for antineoplastic chemotherapy: Secondary | ICD-10-CM | POA: Diagnosis not present

## 2017-08-09 MED ORDER — ALBUTEROL SULFATE HFA 108 (90 BASE) MCG/ACT IN AERS: 2.0000 | INHALATION_SPRAY | Freq: Four times a day (QID) | RESPIRATORY_TRACT | 2 refills | Status: DC | PRN

## 2017-08-09 MED ORDER — AMOXICILLIN-POT CLAVULANATE 875-125 MG PO TABS: 1.0000 | ORAL_TABLET | Freq: Two times a day (BID) | ORAL | 0 refills | Status: DC

## 2017-08-09 MED ORDER — PEGFILGRASTIM-CBQV 6 MG/0.6ML ~~LOC~~ SOSY
6.0000 mg | PREFILLED_SYRINGE | Freq: Once | SUBCUTANEOUS | Status: AC
  Administered 2017-08-09: 6 mg via SUBCUTANEOUS

## 2017-08-09 NOTE — Progress Notes (Signed)
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  Chief complaint/ Reason for visit- wheezing/cough/left ear pain  Heme/Onc history: Patient was last seen by primary medical oncologist Dr. Grayland Ormond on 08/08/2017 for assessment prior to cycle 1 of AC chemotherapy.  She continued to be anxious but otherwise denied any other complaints.  She is scheduled to return to clinic today for Neulasta and again in 1 week for lab work only and in 2 weeks for further evaluation and consideration of cycle 2.   Oncology History   Patient is a 67 year old female who recently self palpated a mass on her left breast.  Subsequent imaging and biopsy revealed the above-stated breast cancer.  Case was also discussed extensively at case conference.  Given the size and the stage of patient's malignancy, she will benefit from neoadjuvant chemotherapy using Adriamycin, Cytoxan, and Taxol.  Patient will also require Neulasta support.  Will get CT scan of the chest, abdomen, and pelvis to assess for any metastatic disease.  Patient will also require port placement and MUGA prior to initiating treatment  CT abdomen/pelvis/chest did not reveal any suspicious lesions concerning for metastatic disease. (08/01/17)  Port-A-Cath placed on 08/07/2017.  Cycle 1 day 1 of AC was given on 08/08/17.       Breast cancer, stage 2, left ([place])   07/24/2017 Initial Diagnosis    Breast cancer, stage 2, left ([place])      07/30/2017 Cancer Staging    Staging form: Breast, AJCC 8th Edition - Clinical stage from 07/30/2017: Stage  IIA (cT2, cN1, cM0, G2, ER+, PR+, HER2-) - Signed by Lloyd Huger, MD on 07/30/2017      07/31/2017 -  Chemotherapy    The patient had DOXOrubicin (ADRIAMYCIN) chemo injection 130 mg, 60 mg/m2 = 130 mg, Intravenous,  Once, 1 of 4 cycles Administration: 130 mg (08/08/2017) palonosetron (ALOXI) injection 0.25 mg, 0.25 mg, Intravenous,  Once, 1 of 4 cycles Administration: 0.25 mg (08/08/2017) pegfilgrastim-cbqv (UDENYCA) injection 6 mg, 6 mg, Subcutaneous, Once, 1 of 4 cycles cyclophosphamide (CYTOXAN) 1,300 mg in sodium chloride 0.9 % 250 mL chemo infusion, 600 mg/m2 = 1,300 mg, Intravenous,  Once, 1 of 4 cycles Administration: 1,300 mg (08/08/2017) PACLitaxel (TAXOL) 174 mg in sodium chloride 0.9 % 250 mL chemo infusion (</= 23m/m2), 80 mg/m2 = 174 mg, Intravenous,  Once, 0 of 12 cycles fosaprepitant (EMEND) 150 mg, dexamethasone (DECADRON) 12 mg in sodium chloride 0.9 % 145 mL IVPB, , Intravenous,  Once, 1 of 4 cycles Administration:  (08/08/2017)  for chemotherapy treatment.        Interval history-  Patient presents with acute sinusitis. The patient reports episodes of chronic sinus infections for 1 year.  Her symptoms include nasal congestion, cough, headaches, yellow mucous and left ear pain.  There has not been a history of sneezing, sniffing, fevers, sore throats. There has not been a history of chronic otitis media or pharyngotonsillitis.  Prior antibiotic therapy has included does not recall. Other medications have included nasal steroids.  She has not had allergy testing which was not done.  ECOG FS:1 - Symptomatic but completely ambulatory  Review of systems- Review of Systems  Constitutional: Positive for malaise/fatigue. Negative for chills, fever and weight loss.  HENT: Positive for congestion, ear pain (Left ear) and sinus pain.   Eyes: Negative.  Negative for blurred vision and double vision.  Respiratory: Positive for cough and sputum production (yellow). Negative for  shortness of breath.   Cardiovascular: Negative.  Negative for chest pain, palpitations and leg swelling.  Gastrointestinal: Negative.  Negative for abdominal pain, constipation, diarrhea, nausea and vomiting.  Genitourinary: Negative for dysuria, frequency and urgency.  Musculoskeletal: Negative for back pain and falls.  Skin: Negative.  Negative for rash.  Neurological: Positive for headaches. Negative for weakness.  Endo/Heme/Allergies: Negative.  Does not bruise/bleed easily.  Psychiatric/Behavioral: Negative.  Negative for depression. The patient is not nervous/anxious and does not have insomnia.      Current treatment- s/p cycle 1 AC on 08/08/2017.  No Known Allergies   Past Medical History:  Diagnosis Date  . Anxiety   . Depression   . Hypertension   . Hypothyroidism   . Rapid heart rate   . Thyroid disease      Past Surgical History:  Procedure Laterality Date  . AXILLARY LYMPH NODE BIOPSY Left 07/16/2017   METASTATIC MAMMARY CARCINOMA  . BREAST BIOPSY Left 07/16/2017   [place] bx of left breast mass and left breast LN.  INVASIVE MAMMARY CARCINOMA, NO SPECIAL TYPE.   Marland Kitchen BREAST EXCISIONAL BIOPSY Right 2001  . BREAST LUMPECTOMY Right    2001  . COLONOSCOPY    . PORTACATH PLACEMENT Right 08/07/2017   Procedure: INSERTION PORT-A-CATH;  Surgeon: Robert Bellow, MD;  Location: [facility] ORS;  Service: General;  Laterality: Right;  . TONSILLECTOMY      Social History   Socioeconomic History  . Marital status: Widowed    Spouse name: Not on file  . Number of children: Not on file  . Years of education: Not on file  . Highest education level: Not on file  Occupational History  . Not on file  Social Needs  . Financial resource strain: Not on file  . Food insecurity:    Worry: Not on file    Inability: Not on file  . Transportation needs:    Medical: Not on file    Non-medical: Not on file  Tobacco Use  . Smoking status: Current Some Day Smoker    Packs/day: 0.50     Years: 30.00    Pack years: 15.00    Types: Cigarettes  . Smokeless tobacco: Never Used  Substance and Sexual Activity  . Alcohol use: Yes    Comment: occasional q 60mo  . Drug use: Never  . Sexual activity: Not Currently  Lifestyle  . Physical activity:    Days per week: Not on file    Minutes per session: Not on file  . Stress: Not on file  Relationships  . Social connections:    Talks on phone: Not on file    Gets together: Not on file    Attends religious service: Not on file    Active member of club or organization: Not on file    Attends meetings of clubs or organizations: Not on file    Relationship status: Not on file  . Intimate partner violence:    Fear of current or ex partner: Not on file    Emotionally abused: Not on file    Physically abused: Not on file    Forced sexual activity: Not on file  Other Topics Concern  . Not on file  Social History Narrative  . Not on file    Family History  Problem Relation Age of Onset  . Stroke Mother   . Thyroid disease Mother   . Renal Disease Mother   . Stroke Father   . Heart attack Father   . Sudden death Father 45       suicide  . Anuerysm Brother   . Breast cancer Neg Hx      Current Outpatient Medications:  .  ALPRAZolam (XANAX) 0.25 MG tablet, Take 0.25 mg by mouth 2 (two) times daily. , Disp: , Rfl:  .  amLODipine (NORVASC) 5 MG tablet, Take 5 mg by mouth daily., Disp: , Rfl:  .  aspirin EC 81 MG tablet, Take 81 mg by mouth daily., Disp: , Rfl:  .  HYDROcodone-acetaminophen (NORCO/VICODIN) 5-325 MG tablet, Take 1 tablet by mouth every 4 (four) hours as needed for moderate pain., Disp: 10 tablet, Rfl: 0 .  levothyroxine (SYNTHROID, LEVOTHROID) 100 MCG tablet, Take 100 mcg by mouth daily before breakfast., Disp: , Rfl:  .  lidocaine-prilocaine (EMLA) cream, Apply to affected area once (Patient taking differently: Apply 1 application topically once. Used when port is accessed), Disp: 30 g, Rfl: 3 .  LYSINE PO,  Take by mouth as needed., Disp: , Rfl:  .  ondansetron (ZOFRAN) 8 MG tablet, Take 1 tablet (8 mg total) by mouth 2 (two) times daily as needed. Start on the third day after chemotherapy., Disp: 30 tablet, Rfl: 2 .  Potassium 99 MG TABS, Take 99 mg by mouth daily., Disp: , Rfl:  .  prochlorperazine (COMPAZINE) 10 MG tablet, TAKE 1 TABLET(10 MG) BY MOUTH EVERY 6 HOURS AS NEEDED FOR NAUSEA OR VOMITING, Disp: 360 tablet, Rfl: 2 .  traZODone (DESYREL) 50 MG tablet, Take 50 mg by mouth at bedtime., Disp: , Rfl:  .  albuterol (PROVENTIL HFA;VENTOLIN HFA) 108 (90 Base) MCG/ACT inhaler, Inhale 2 puffs into the lungs every 6 (six) hours as needed for wheezing or shortness of breath., Disp: 1 Inhaler, Rfl: 2 .  amoxicillin-clavulanate (AUGMENTIN) 875-125 MG tablet, Take 1 tablet by mouth 2 (two) times daily., Disp: 20 tablet, Rfl: 0  Physical exam:  Vitals:   08/09/17 1352  BP: 136/75  Pulse: 86  Resp: 20  Temp: (!) 97 F (36.1 C)  TempSrc: Tympanic  Weight: 226 lb 14.4 oz (102.9 kg)   Physical Exam  Constitutional: She is oriented to person, place, and time. Vital signs are normal. She appears well-developed and well-nourished.  HENT:  Head: Normocephalic and atraumatic.  Left Ear: There is tenderness. Tympanic membrane is erythematous. A middle ear effusion is present.  Nose: Right sinus exhibits maxillary sinus tenderness and frontal sinus tenderness. Left sinus exhibits maxillary sinus tenderness and frontal sinus tenderness.  Eyes: Pupils are equal, round, and reactive to light.  Neck: Normal range of motion.  Cardiovascular: Normal rate, regular rhythm and normal heart sounds.  No murmur heard. Pulmonary/Chest: Effort normal. She has wheezes in the right upper field.  Abdominal: Soft. Normal appearance and bowel sounds are normal. She exhibits no distension. There is no tenderness.  Musculoskeletal: Normal range of motion. She exhibits no edema.  Neurological: She is alert and oriented to  person, place, and time.  Skin: Skin is warm and dry. No rash noted.  Psychiatric: Judgment normal.     CMP Latest Ref Rng & Units 08/08/2017  Glucose 70 - 99 mg/dL 118(H)  BUN 8 - 23 mg/dL 16  Creatinine 0.44 - 1.00 mg/dL 0.98  Sodium 135 - 145 mmol/L 140  Potassium 3.5 - 5.1 mmol/L 4.0  Chloride 98 - 111 mmol/L 110  CO2 22 - 32 mmol/L 20(L)  Calcium 8.9 - 10.3 mg/dL 8.8(L)  Total Protein 6.5 - 8.1 g/dL 6.5  Total Bilirubin 0.3 - 1.2 mg/dL 0.7  Alkaline Phos 38 - 126 U/L 125  AST 15 - 41 U/L 27  ALT 0 - 44 U/L 26   CBC Latest Ref Rng & Units 08/08/2017  WBC 3.6 - 11.0 K/uL 7.1  Hemoglobin 12.0 - 16.0 g/dL 13.0  Hematocrit 35.0 - 47.0 % 38.2  Platelets 150 - 440 K/uL 219    No images are attached to the encounter.  Ct Chest W Contrast  Result Date: 08/02/2017 CLINICAL DATA:  Newly diagnosed left breast cancer EXAM: CT CHEST, ABDOMEN, AND PELVIS WITH CONTRAST TECHNIQUE: Multidetector CT imaging of the chest, abdomen and pelvis was performed following the standard protocol during bolus administration of intravenous contrast. CONTRAST:  150m ISOVUE-300 IOPAMIDOL (ISOVUE-300) INJECTION 61% COMPARISON:  None. FINDINGS: CT CHEST FINDINGS Cardiovascular: Heart is normal in size.  No pericardial effusion. No evidence of thoracic aortic aneurysm. Mild atherosclerotic calcifications of the aortic arch. Mediastinum/Nodes: Lower outer left breast mass with indwelling surgical clip (series 2/image 30), corresponding to the patient's known primary breast neoplasm. Multiple left axillary lymph nodes (series 2/images 10-20), measuring up to 18 mm short axis, compatible with nodal metastases. No suspicious mediastinal, hilar, or axillary lymphadenopathy. Lungs/Pleura: Mild centrilobular and paraseptal emphysematous changes. 9 mm calcified granuloma in the posterior right lower lobe (series 3/image 112), benign. 4 mm pleural-based nodule in the anterior right lower lobe along the major fissure (series  3/image 101), likely benign given the perifissural location. Additional 3 x 6 mm flat nodule along the lateral right lower lobe (series 3/image 105), also pleural-based and likely benign. No suspicious pulmonary nodules. Mild compressive atelectasis along the medial right lower lobe. No focal consolidation. No pleural effusion or pneumothorax. Musculoskeletal: Mild degenerative changes of the lower thoracic spine. CT ABDOMEN PELVIS FINDINGS Hepatobiliary: Liver is within normal limits. Gallbladder is unremarkable. No intrahepatic or extrahepatic ductal dilatation. Pancreas: Within normal limits. Spleen: Within normal limits. Adrenals/Urinary Tract: Adrenal glands are within normal limits. 1.6 cm cyst in the posterior left upper kidney (series 9/image 11). 4.3 cm cyst in the lateral left upper kidney (series 9/image 16). Right kidney is within normal limits. No hydronephrosis. Bladder is within normal limits. Stomach/Bowel: Stomach is within normal limits. No evidence of bowel obstruction. Normal appendix (series 2/image 87). Mild left colonic diverticulosis, without evidence of diverticulitis. Vascular/Lymphatic: No evidence of abdominal aortic aneurysm. Mild atherosclerotic calcifications the bilateral iliac arteries. No suspicious abdominopelvic lymphadenopathy. Reproductive: Uterus is within normal limits. Bilateral ovaries are within normal limits. Other: No abdominopelvic ascites. Musculoskeletal: Mild degenerative changes of the lumbar spine. IMPRESSION: Lower outer left breast mass, corresponding to the patient's known primary breast neoplasm. Left axillary nodal metastases, measuring up to 1.8 cm short axis. Otherwise, no findings suspicious for metastatic disease. Two pleural-based nodules measuring up to 5 mm (mean diameter), as described above, not considered suspicious for metastases. Consider six-month follow-up CT chest to confirm stability, as clinically warranted. Please note that Fleischner Society  guidelines do not apply. Electronically Signed   By: SJulian HyM.D.   On: 08/02/2017 08:50   Nm Cardiac Muga Rest  Result Date: 08/02/2017 CLINICAL DATA:  LEFT breast cancer,  pre cardiotoxic chemotherapy EXAM: NUCLEAR MEDICINE CARDIAC BLOOD POOL IMAGING (MUGA) TECHNIQUE: Cardiac multi-gated acquisition was performed at rest following intravenous injection of Tc-47mlabeled red blood cells. RADIOPHARMACEUTICALS:  23.75 mCi Tc-955mertechnetate in-vitro labeled red blood cells IV COMPARISON:  None FINDINGS: Calculated LEFT ventricular ejection fraction is 61%, normal. Study was obtained at a cardiac rate of 64 bpm. Patient was mildly a rhythmic during imaging. Cine analysis of the LEFT ventricle in 3 projections demonstrates no focal LEFT ventricular wall motion abnormalities. IMPRESSION: Normal LEFT ventricular ejection fraction of 61%. Normal LEFT ventricular wall motion. Electronically Signed   By: MaLavonia Dana.D.   On: 08/02/2017 15:45   Ct Abdomen Pelvis W Contrast  Result Date: 08/02/2017 CLINICAL DATA:  Newly diagnosed left breast cancer EXAM: CT CHEST, ABDOMEN, AND PELVIS WITH CONTRAST TECHNIQUE: Multidetector CT imaging of the chest, abdomen and pelvis was performed following the standard protocol during bolus administration of intravenous contrast. CONTRAST:  10094mSOVUE-300 IOPAMIDOL (ISOVUE-300) INJECTION 61% COMPARISON:  None. FINDINGS: CT CHEST FINDINGS Cardiovascular: Heart is normal in size.  No pericardial effusion. No evidence of thoracic aortic aneurysm. Mild atherosclerotic calcifications of the aortic arch. Mediastinum/Nodes: Lower outer left breast mass with indwelling surgical clip (series 2/image 30), corresponding to the patient's known primary breast neoplasm. Multiple left axillary lymph nodes (series 2/images 10-20), measuring up to 18 mm short axis, compatible with nodal metastases. No suspicious mediastinal, hilar, or axillary lymphadenopathy. Lungs/Pleura: Mild  centrilobular and paraseptal emphysematous changes. 9 mm calcified granuloma in the posterior right lower lobe (series 3/image 112), benign. 4 mm pleural-based nodule in the anterior right lower lobe along the major fissure (series 3/image 101), likely benign given the perifissural location. Additional 3 x 6 mm flat nodule along the lateral right lower lobe (series 3/image 105), also pleural-based and likely benign. No suspicious pulmonary nodules. Mild compressive atelectasis along the medial right lower lobe. No focal consolidation. No pleural effusion or pneumothorax. Musculoskeletal: Mild degenerative changes of the lower thoracic spine. CT ABDOMEN PELVIS FINDINGS Hepatobiliary: Liver is within normal limits. Gallbladder is unremarkable. No intrahepatic or extrahepatic ductal dilatation. Pancreas: Within normal limits. Spleen: Within normal limits. Adrenals/Urinary Tract: Adrenal glands are within normal limits. 1.6 cm cyst in the posterior left upper kidney (series 9/image 11). 4.3 cm cyst in the lateral left upper kidney (series 9/image 16). Right kidney is within normal limits. No hydronephrosis. Bladder is within normal limits. Stomach/Bowel: Stomach is within normal limits. No evidence of bowel obstruction. Normal appendix (series 2/image 87). Mild left colonic diverticulosis, without evidence of diverticulitis. Vascular/Lymphatic: No evidence of abdominal aortic aneurysm. Mild atherosclerotic calcifications the bilateral iliac arteries. No suspicious abdominopelvic lymphadenopathy. Reproductive: Uterus is within normal limits. Bilateral ovaries are within normal limits. Other: No abdominopelvic ascites. Musculoskeletal: Mild degenerative changes of the lumbar spine. IMPRESSION: Lower outer left breast mass, corresponding to the patient's known primary breast neoplasm. Left axillary nodal metastases, measuring up to 1.8 cm short axis. Otherwise, no findings suspicious for metastatic disease. Two  pleural-based nodules measuring up to 5 mm (mean diameter), as described above, not considered suspicious for metastases. Consider six-month follow-up CT chest to confirm stability, as clinically warranted. Please note that Fleischner Society guidelines do not apply. Electronically Signed   By: SriJulian HyD.   On: 08/02/2017 08:50   Dg Chest Port 1 View  Result Date: 08/07/2017 CLINICAL DATA:  Breast cancer and status post Port-A-Cath placement. EXAM: PORTABLE CHEST 1 VIEW COMPARISON:  Chest radiograph 02/17/2015 FINDINGS: Right subclavian  Port-A-Cath has been placed. Catheter tip in the expected region of the SVC. Negative for a pneumothorax. Lungs are clear. Heart size is normal. IMPRESSION: Right subclavian Port-A-Cath.  Catheter tip in the SVC. Negative for pneumothorax. Electronically Signed   By: Markus Daft M.D.   On: 08/07/2017 15:44   Dg C-arm 1-60 Min-no Report  Result Date: 08/07/2017 Fluoroscopy was utilized by the requesting physician.  No radiographic interpretation.   Mm Clip Placement Left  Result Date: 07/16/2017 CLINICAL DATA:  Ultrasound-guided biopsies of a palpable suspicious left breast mass and an enlarged left axillary lymph node were performed today. EXAM: DIAGNOSTIC LEFT MAMMOGRAM POST ULTRASOUND BIOPSIES COMPARISON:  Previous exam(s). FINDINGS: Mammographic images were obtained following ultrasound guided biopsy of a left breast mass at 2 o'clock position and a suspicious enlarged left axillary lymph node. Coil shaped biopsy clip is satisfactorily positioned within the biopsied mass. Lidocaine/biopsy changes are seen anterior to the enlarged biopsied lymph node, but the biopsy clip is not included on the image. Based on the ultrasound images at the end of the axillary lymph node biopsy, it is felt that the biopsy clip is on the deep side of the biopsied lymph node. IMPRESSION: Satisfactory position of coil shaped biopsy clip in the left breast mass. The biopsy clip  placed within left axillary lymph node is not visible on the mammogram. Final Assessment: Post Procedure Mammograms for Marker Placement Electronically Signed   By: Curlene Dolphin M.D.   On: 07/16/2017 14:20   [place] Lt Breast Bx W Loc Dev 1st Lesion Img Bx Spec US Guide  Addendum Date: 07/23/2017   ADDENDUM REPORT: 07/23/2017 13:32 ADDENDUM: Pathology of the left breast biopsy revealed A. BREAST, LEFT, 2:30, 5 CM FROM NIPPLE; ULTRASOUND-GUIDED CORE BIOPSY: INVASIVE MAMMARY CARCINOMA, NO SPECIAL TYPE. Size of invasive carcinoma: 15 mm in this sample. Histologic grade of invasive carcinoma: Grade 2. Ductal carcinoma in situ: Present, focal. Lymphovascular invasion: Not identified. ER/PR/HER2: Immunohistochemistry will be performed on block A1, with reflex to [place] for HER2 2+. The results will be reported in an addendum. Comment: The definitive grade will be assigned on the excisional specimen. These findings were communicated to [facility] in Dr. Landis Gandy' office on 07/17/2017 at 2:50 PM. Read back procedure was performed. This was found to be concordant by Dr. Radford Pax. Recommendation: Surgical and oncology referrals. The patient has an appointment scheduled with Dr. Bary Castilla for 08/01/17. At the patient's request, results and recommendations were relayed to the patient by phone by Dr. Miquel Dunn on 07/19/17. The patient stated she did well following the biopsy. Post biopsy instructions were reviewed with the patient and all of her questions were answered. She was encouraged to contact the [facility] with any further questions or concerns. The patient stated Dr. Lavera Guise was awaiting the results of the biopsy and would contact Dr. Dwyane Luo office to move up her appointment if the results were positive. Dr. Jennette Kettle office was closed at the time of conversation. Dr. Jennette Kettle office was contacted by phone by Jetta Lout, N[place] on 07/22/17 with results. A copy of the pathology report was also faxed to Dr. Jennette Kettle  office. They will contact Dr. Dwyane Luo office after Dr. Lavera Guise has reviewed the report. Addendum by Jetta Lout, RRA on 07/23/17. Electronically Signed   By: Curlene Dolphin M.D.   On: 07/23/2017 13:32   Result Date: 07/23/2017 CLINICAL DATA:  Ultrasound-guided core needle biopsy was recommended of a palpable mass in the 2:30 position of the left breast. EXAM: ULTRASOUND  GUIDED LEFT BREAST CORE NEEDLE BIOPSY COMPARISON:  Previous exam(s). FINDINGS: I met with the patient and we discussed the procedure of ultrasound-guided biopsy, including benefits and alternatives. We discussed the high likelihood of a successful procedure. We discussed the risks of the procedure, including infection, bleeding, tissue injury, clip migration, and inadequate sampling. Informed written consent was given. The usual time-out protocol was performed immediately prior to the procedure. Lesion quadrant: Upper outer quadrant Using sterile technique and 1% Lidocaine as local anesthetic, under direct ultrasound visualization, a 12 gauge spring-loaded device was used to perform biopsy of a palpable irregular hypoechoic mass centered at 2:30 position 5 cm from the nipple using a lateral approach. At the conclusion of the procedure a coil tissue marker clip was deployed into the biopsy cavity. Follow up 2 view mammogram was performed and dictated separately. IMPRESSION: Ultrasound guided biopsy of the left breast. No apparent complications. Electronically Signed: By: Curlene Dolphin M.D. On: 07/16/2017 14:22   [place] Lt Breast Bx W Loc Dev Ea Add Lesion Img Bx Spec US Guide  Addendum Date: 07/23/2017   ADDENDUM REPORT: 07/23/2017 13:25 ADDENDUM: Pathology of the left axillary lymph node revealed B. LYMPH NODE, LEFT AXILLA; ULTRASOUND-GUIDED CORE BIOPSY: METASTATIC MAMMARY CARCINOMA, MEASURING AT LEAST 9 MM. This was found to be concordant by Dr. Radford Pax. Recommendation: Surgical and oncology referrals. The patient has an appointment scheduled with Dr.  Bary Castilla for 08/01/17. At the patient's request, results and recommendations were relayed to the patient by phone by Dr. Miquel Dunn on 07/19/17. The patient stated she did well following the biopsy. Post biopsy instructions were reviewed with the patient and all of her questions were answered. She was encouraged to contact the [facility] with any further questions or concerns. The patient told Dr. Miquel Dunn that Dr. Lavera Guise was awaiting the results of the biopsy and would move her appointment up with Dr. Bary Castilla if the results were positive. His office was closed at the time of conversation with Dr. Miquel Dunn. Jetta Lout, RRA contacted Dr. Jennette Kettle office by phone on Monday, 07/22/17 and also faxed a copy of the pathology report to his office. They will contact Dr. Dwyane Luo office after Dr. Lavera Guise has reviewed the results. Addendum by Jetta Lout, RRA on 07/23/17. Electronically Signed   By: Curlene Dolphin M.D.   On: 07/23/2017 13:25   Result Date: 07/23/2017 CLINICAL DATA:  Ultrasound-guided core needle biopsy was recommended of an enlarged suspicious left axillary lymph node. EXAM: [place] AXILLARY NODE CORE BIOPSY LEFT COMPARISON:  Previous exam(s). FINDINGS: I met with the patient and we discussed the procedure of ultrasound-guided biopsy, including benefits and alternatives. We discussed the high likelihood of a successful procedure. We discussed the risks of the procedure, including infection, bleeding, tissue injury, clip migration, and inadequate sampling. Informed written consent was given. The usual time-out protocol was performed immediately prior to the procedure. Using sterile technique and 1% Lidocaine as local anesthetic, under direct ultrasound visualization, a 14 gauge spring-loaded device was used to perform biopsy of an enlarged left axillary lymph node using a lateral approach. At the conclusion of the procedure a HydroMARK tissue marker clip was deployed into the biopsy cavity. Follow up 2 view mammogram  was performed and dictated separately. IMPRESSION: Ultrasound guided biopsy of a left axillary lymph node. No apparent complications. Electronically Signed: By: Curlene Dolphin M.D. On: 07/16/2017 14:22     Assessment and plan- Patient is a 67 y.o. female who presents with acute sinusitis.   1.  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  Patient will likely require XRT followed by aromatase inhibitor at the conclusion of her treatments.  CT scans did not reveal any metastatic findings. RTC in 2 weeks with labs/md assessment and cycle 2 AC + Taxol.   2.  Acute sinusitis with injected left TM: Painful left ear on examination. History of sinus infection. Maillary and frontal sinus tenderness with palpations. Afebrile. Given injected left TM will treat with RX  Augmentin 875-125 mg BID for 10 days.  3.  RUQ wheezing: likely d/t sinusitis. Clear sputum production. RX Albuterol inhaler 2 puffs q 6 hours PRN for wheezing. Has hx of COPD.  Patient educated that if symptoms fail to resolve or worsen to return to clinic.   Visit Diagnosis 1. Acute non-recurrent maxillary sinusitis     Patient expressed understanding and was in agreement with this plan. She also understands that She can call clinic at any time with any questions, concerns, or complaints.   Greater than 50% was spent in counseling and coordination of care with this patient including but not limited to discussion of the relevant topics above (See A&P) including, but not limited to diagnosis and management of acute and chronic medical conditions.    Faythe Casa, AGNP-C [facility] [place]- [phone] Pager- [phone] 08/13/2017 3:59 PM

## 2017-08-09 NOTE — Telephone Encounter (Signed)
Patient called and reports that she had her first chemotherapy treatment yesterday and she is having congestion and a wheezy cough today. Appointment given for 130 prior to her injection appointment today

## 2017-08-09 NOTE — Progress Notes (Signed)
Patient here to see symptom management. Pt states she woke up this morning feeling wheezy and congested. Breathing labored. She also states she had blood in left ear. Complaining of chest tightness.

## 2017-08-12 ENCOUNTER — Telehealth: Payer: Self-pay | Admitting: *Deleted

## 2017-08-12 ENCOUNTER — Telehealth: Payer: Self-pay | Admitting: Oncology

## 2017-08-12 NOTE — Telephone Encounter (Signed)
Patient called questioning her ankles being so swollen after her first chemotherapy treatment [facility]) last week, States she has never had that problem before. Please advise

## 2017-08-12 NOTE — Telephone Encounter (Signed)
Patient informed and states she has been active and she will call back if worsens

## 2017-08-12 NOTE — Telephone Encounter (Signed)
Spoke with patient this morning in regards to most recent visit for acute sinusitis and patient appears to be doing better since initiating Augmentin on Friday.  States she is still having discomfort in left ear but otherwise is doing well.  She denies fevers.  Complains of lower extremity swelling.  Spoke with on-call physician (Dr. Janese Banks) over the weekend and the recommendation was to elevate legs while sitting/resting but otherwise to try and stay active and hydrated.  Patient understands.  We will follow-up with her later this week.  Have asked her to see [place] in symptom management on Thursday after her labs to recheck her ear and lower extremity swelling.  Patient in agreement with plan.  Faythe Casa, NP 08/12/2017 12:08 PM

## 2017-08-12 NOTE — Telephone Encounter (Signed)
Keep feet elevated as much as possible. Encourage ambulation and physical activity. If it is worse despite this- please call back.  Jenn- You saw her recently. Can you please f/u?

## 2017-08-15 ENCOUNTER — Other Ambulatory Visit: Payer: Self-pay

## 2017-08-15 ENCOUNTER — Inpatient Hospital Stay: Payer: Medicare Other | Attending: Oncology

## 2017-08-15 ENCOUNTER — Inpatient Hospital Stay (HOSPITAL_BASED_OUTPATIENT_CLINIC_OR_DEPARTMENT_OTHER): Payer: Medicare Other | Admitting: Oncology

## 2017-08-15 VITALS — BP 139/72 | HR 77 | Temp 97.9°F | Resp 22

## 2017-08-15 DIAGNOSIS — Z5189 Encounter for other specified aftercare: Secondary | ICD-10-CM | POA: Diagnosis not present

## 2017-08-15 DIAGNOSIS — J018 Other acute sinusitis: Secondary | ICD-10-CM | POA: Diagnosis not present

## 2017-08-15 DIAGNOSIS — Z5111 Encounter for antineoplastic chemotherapy: Secondary | ICD-10-CM | POA: Diagnosis not present

## 2017-08-15 DIAGNOSIS — F17210 Nicotine dependence, cigarettes, uncomplicated: Secondary | ICD-10-CM | POA: Diagnosis not present

## 2017-08-15 DIAGNOSIS — H65112 Acute and subacute allergic otitis media (mucoid) (sanguinous) (serous), left ear: Secondary | ICD-10-CM | POA: Insufficient documentation

## 2017-08-15 DIAGNOSIS — C50412 Malignant neoplasm of upper-outer quadrant of left female breast: Secondary | ICD-10-CM

## 2017-08-15 DIAGNOSIS — C50912 Malignant neoplasm of unspecified site of left female breast: Secondary | ICD-10-CM

## 2017-08-15 DIAGNOSIS — I10 Essential (primary) hypertension: Secondary | ICD-10-CM | POA: Diagnosis not present

## 2017-08-15 LAB — COMPREHENSIVE METABOLIC PANEL
ALT: 28 U/L (ref 0–44)
AST: 18 U/L (ref 15–41)
Albumin: 3.9 g/dL (ref 3.5–5.0)
Alkaline Phosphatase: 140 U/L — ABNORMAL HIGH (ref 38–126)
Anion gap: 10 (ref 5–15)
BUN: 20 mg/dL (ref 8–23)
CO2: 21 mmol/L — ABNORMAL LOW (ref 22–32)
Calcium: 9.3 mg/dL (ref 8.9–10.3)
Chloride: 104 mmol/L (ref 98–111)
Creatinine, Ser: 0.84 mg/dL (ref 0.44–1.00)
GFR calc Af Amer: >60 mL/min (ref >60)
GFR calc non Af Amer: >60 mL/min (ref >60)
Glucose, Bld: 110 mg/dL — ABNORMAL HIGH (ref 70–99)
Potassium: 3.8 mmol/L (ref 3.5–5.1)
Sodium: 135 mmol/L (ref 135–145)
Total Bilirubin: 0.7 mg/dL (ref 0.3–1.2)
Total Protein: 7 g/dL (ref 6.5–8.1)

## 2017-08-15 LAB — CBC WITH DIFFERENTIAL/PLATELET
Basophils Absolute: 0 10*3/uL (ref 0–0.1)
Basophils Relative: 1 %
Eosinophils Absolute: 0.2 10*3/uL (ref 0–0.7)
Eosinophils Relative: 9 %
HCT: 35.3 % (ref 35.0–47.0)
Hemoglobin: 11.9 g/dL — ABNORMAL LOW (ref 12.0–16.0)
Lymphocytes Relative: 24 %
Lymphs Abs: 0.6 10*3/uL — ABNORMAL LOW (ref 1.0–3.6)
MCH: 29.6 pg (ref 26.0–34.0)
MCHC: 33.7 g/dL (ref 32.0–36.0)
MCV: 88 fL (ref 80.0–100.0)
Monocytes Absolute: 0.3 10*3/uL (ref 0.2–0.9)
Monocytes Relative: 12 %
Neutro Abs: 1.4 10*3/uL (ref 1.4–6.5)
Neutrophils Relative %: 54 %
Platelets: 149 10*3/uL — ABNORMAL LOW (ref 150–440)
RBC: 4.01 MIL/uL (ref 3.80–5.20)
RDW: 13.6 % (ref 11.5–14.5)
WBC: 2.6 10*3/uL — ABNORMAL LOW (ref 3.6–11.0)

## 2017-08-16 ENCOUNTER — Telehealth: Payer: Self-pay | Admitting: *Deleted

## 2017-08-16 NOTE — Telephone Encounter (Signed)
Patient had first chemotherapy treatment last week 7/25.  Patient called reporting that she had chills and fever last night, she states she got it under control and that her temp only got to 99.7,but is asking what she is to do if it happens again. Should have her go to ER or do you want to put her on antibiotics. Please advise.

## 2017-08-16 NOTE — Progress Notes (Signed)
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 by me on 08/09/2016 for wheezing, cough and left ear pain.  She recently received her first cycle of AC chemotherapy.  Patient was found to have maxillary and frontal sinus tenderness along with a left middle ear effusion.  She was given a prescription for Augmentin 875-125 mg twice daily for 10 days and albuterol inhaler every 6 hours for wheezing.  She was instructed to return to clinic if symptoms worsen or do not improve.  Oncology History   Patient is a 67 year old female who recently self palpated a mass on her left breast.  Subsequent imaging and biopsy revealed the above-stated breast cancer.  Case was also discussed extensively at case conference.  Given the size and the stage of patient's malignancy, she will benefit from neoadjuvant chemotherapy using Adriamycin, Cytoxan, and Taxol.  Patient will also require Neulasta support.  Will get CT scan of the chest, abdomen, and pelvis to assess for any metastatic disease.  Patient will also require port placement and MUGA prior to initiating treatment  CT abdomen/pelvis/chest did not reveal any suspicious lesions concerning for metastatic disease. (08/01/17)  Port-A-Cath placed on 08/07/2017.  Cycle 1 day 1 of AC was given on 08/08/17.       Breast cancer, stage 2, left ([place])   07/24/2017 Initial Diagnosis    Breast cancer, stage 2, left ([place])      07/30/2017 Cancer Staging    Staging form: Breast, AJCC 8th Edition - Clinical  stage from 07/30/2017: Stage IIA (cT2, cN1, cM0, G2, ER+, PR+, HER2-) - Signed by Lloyd Huger, MD on 07/30/2017      07/31/2017 -  Chemotherapy    The patient had DOXOrubicin (ADRIAMYCIN) chemo injection 130 mg, 60 mg/m2 = 130 mg, Intravenous,  Once, 1 of 4 cycles Administration: 130 mg (08/08/2017) palonosetron (ALOXI) injection 0.25 mg, 0.25 mg, Intravenous,  Once, 1 of 4 cycles Administration: 0.25 mg (08/08/2017) pegfilgrastim-cbqv (UDENYCA) injection 6 mg, 6 mg, Subcutaneous, Once, 1 of 4 cycles cyclophosphamide (CYTOXAN) 1,300 mg in sodium chloride 0.9 % 250 mL chemo infusion, 600 mg/m2 = 1,300 mg, Intravenous,  Once, 1 of 4 cycles Administration: 1,300 mg (08/08/2017) PACLitaxel (TAXOL) 174 mg in sodium chloride 0.9 % 250 mL chemo infusion (</= 71m/m2), 80 mg/m2 = 174 mg, Intravenous,  Once, 0 of 12 cycles fosaprepitant (EMEND) 150 mg, dexamethasone (DECADRON) 12 mg in sodium chloride 0.9 % 145 mL IVPB, , Intravenous,  Once, 1 of 4 cycles Administration:  (08/08/2017)  for chemotherapy treatment.         Interval history-  Patient presents with left ear pain.  Symptoms include left ear pain. Symptoms began 7 days ago and are gradually improving since that time. Patient denies chills, dyspnea, fever, nasal congestion and productive cough. Ear history: 0 previous ear infections.   ECOG FS:1 - Symptomatic but completely ambulatory  Review of systems- Review of Systems  Constitutional: Positive for malaise/fatigue. Negative for chills, fever and weight loss.  HENT: Positive for ear pain. Negative for congestion.  Eyes: Negative.  Negative for blurred vision and double vision.  Respiratory: Negative.  Negative for cough, sputum production and shortness of breath.   Cardiovascular: Negative.  Negative for chest pain, palpitations and leg swelling.  Gastrointestinal: Negative.  Negative for abdominal pain, constipation, diarrhea, nausea and vomiting.  Genitourinary: Negative for  dysuria, frequency and urgency.  Musculoskeletal: Negative for back pain and falls.  Skin: Negative.  Negative for rash.  Neurological: Negative.  Negative for weakness and headaches.  Endo/Heme/Allergies: Negative.  Does not bruise/bleed easily.  Psychiatric/Behavioral: Negative.  Negative for depression. The patient is not nervous/anxious and does not have insomnia.      Current treatment- s/p cycle 1 AC chemo on 08/08/2017.  No Known Allergies   Past Medical History:  Diagnosis Date  . Anxiety   . Depression   . Hypertension   . Hypothyroidism   . Rapid heart rate   . Thyroid disease      Past Surgical History:  Procedure Laterality Date  . AXILLARY LYMPH NODE BIOPSY Left 07/16/2017   METASTATIC MAMMARY CARCINOMA  . BREAST BIOPSY Left 07/16/2017   [place] bx of left breast mass and left breast LN.  INVASIVE MAMMARY CARCINOMA, NO SPECIAL TYPE.   Marland Kitchen BREAST EXCISIONAL BIOPSY Right 2001  . BREAST LUMPECTOMY Right    2001  . COLONOSCOPY    . PORTACATH PLACEMENT Right 08/07/2017   Procedure: INSERTION PORT-A-CATH;  Surgeon: Robert Bellow, MD;  Location: [facility] ORS;  Service: General;  Laterality: Right;  . TONSILLECTOMY      Social History   Socioeconomic History  . Marital status: Widowed    Spouse name: Not on file  . Number of children: Not on file  . Years of education: Not on file  . Highest education level: Not on file  Occupational History  . Not on file  Social Needs  . Financial resource strain: Not on file  . Food insecurity:    Worry: Not on file    Inability: Not on file  . Transportation needs:    Medical: Not on file    Non-medical: Not on file  Tobacco Use  . Smoking status: Current Some Day Smoker    Packs/day: 0.50    Years: 30.00    Pack years: 15.00    Types: Cigarettes  . Smokeless tobacco: Never Used  Substance and Sexual Activity  . Alcohol use: Yes    Comment: occasional q 47mo  . Drug use: Never  . Sexual activity: Not Currently    Lifestyle  . Physical activity:    Days per week: Not on file    Minutes per session: Not on file  . Stress: Not on file  Relationships  . Social connections:    Talks on phone: Not on file    Gets together: Not on file    Attends religious service: Not on file    Active member of club or organization: Not on file    Attends meetings of clubs or organizations: Not on file    Relationship status: Not on file  . Intimate partner violence:    Fear of current or ex partner: Not on file    Emotionally abused: Not on file    Physically abused: Not on file    Forced sexual activity: Not on file  Other Topics Concern  . Not on file  Social History Narrative  . Not on file    Family History  Problem Relation Age of Onset  .  Stroke Mother   . Thyroid disease Mother   . Renal Disease Mother   . Stroke Father   . Heart attack Father   . Sudden death Father 73       suicide  . Anuerysm Brother   . Breast cancer Neg Hx      Current Outpatient Medications:  .  albuterol (PROVENTIL HFA;VENTOLIN HFA) 108 (90 Base) MCG/ACT inhaler, Inhale 2 puffs into the lungs every 6 (six) hours as needed for wheezing or shortness of breath., Disp: 1 Inhaler, Rfl: 2 .  ALPRAZolam (XANAX) 0.25 MG tablet, Take 0.25 mg by mouth 2 (two) times daily. , Disp: , Rfl:  .  amLODipine (NORVASC) 5 MG tablet, Take 5 mg by mouth daily., Disp: , Rfl:  .  amoxicillin-clavulanate (AUGMENTIN) 875-125 MG tablet, Take 1 tablet by mouth 2 (two) times daily., Disp: 20 tablet, Rfl: 0 .  aspirin EC 81 MG tablet, Take 81 mg by mouth daily., Disp: , Rfl:  .  HYDROcodone-acetaminophen (NORCO/VICODIN) 5-325 MG tablet, Take 1 tablet by mouth every 4 (four) hours as needed for moderate pain., Disp: 10 tablet, Rfl: 0 .  levothyroxine (SYNTHROID, LEVOTHROID) 100 MCG tablet, Take 100 mcg by mouth daily before breakfast., Disp: , Rfl:  .  lidocaine-prilocaine (EMLA) cream, Apply to affected area once (Patient taking differently:  Apply 1 application topically once. Used when port is accessed), Disp: 30 g, Rfl: 3 .  LYSINE PO, Take by mouth as needed., Disp: , Rfl:  .  ondansetron (ZOFRAN) 8 MG tablet, Take 1 tablet (8 mg total) by mouth 2 (two) times daily as needed. Start on the third day after chemotherapy., Disp: 30 tablet, Rfl: 2 .  Potassium 99 MG TABS, Take 99 mg by mouth daily., Disp: , Rfl:  .  prochlorperazine (COMPAZINE) 10 MG tablet, TAKE 1 TABLET(10 MG) BY MOUTH EVERY 6 HOURS AS NEEDED FOR NAUSEA OR VOMITING, Disp: 360 tablet, Rfl: 2 .  traZODone (DESYREL) 50 MG tablet, Take 50 mg by mouth at bedtime., Disp: , Rfl:   Physical exam:  Vitals:   08/15/17 1147  BP: 139/72  Pulse: 77  Resp: (!) 22  Temp: 97.9 F (36.6 C)  TempSrc: Tympanic   Physical Exam  Constitutional: She is oriented to person, place, and time. Vital signs are normal. She appears well-developed and well-nourished.  HENT:  Head: Normocephalic and atraumatic.  Left Ear: Tympanic membrane normal. There is tenderness.  Eyes: Pupils are equal, round, and reactive to light.  Neck: Normal range of motion.  Cardiovascular: Normal rate, regular rhythm and normal heart sounds.  No murmur heard. Pulmonary/Chest: Effort normal and breath sounds normal. She has no wheezes.  Abdominal: Soft. Normal appearance and bowel sounds are normal. She exhibits no distension. There is no tenderness.  Musculoskeletal: Normal range of motion. She exhibits no edema.  Neurological: She is alert and oriented to person, place, and time.  Skin: Skin is warm and dry. No rash noted.  Psychiatric: Judgment normal.     CMP Latest Ref Rng & Units 08/15/2017  Glucose 70 - 99 mg/dL 110(H)  BUN 8 - 23 mg/dL 20  Creatinine 0.44 - 1.00 mg/dL 0.84  Sodium 135 - 145 mmol/L 135  Potassium 3.5 - 5.1 mmol/L 3.8  Chloride 98 - 111 mmol/L 104  CO2 22 - 32 mmol/L 21(L)  Calcium 8.9 - 10.3 mg/dL 9.3  Total Protein 6.5 - 8.1 g/dL 7.0  Total Bilirubin 0.3 - 1.2 mg/dL 0.7   Alkaline Phos  38 - 126 U/L 140(H)  AST 15 - 41 U/L 18  ALT 0 - 44 U/L 28   CBC Latest Ref Rng & Units 08/15/2017  WBC 3.6 - 11.0 K/uL 2.6(L)  Hemoglobin 12.0 - 16.0 g/dL 11.9(L)  Hematocrit 35.0 - 47.0 % 35.3  Platelets 150 - 440 K/uL 149(L)    No images are attached to the encounter.  Ct Chest W Contrast  Result Date: 08/02/2017 CLINICAL DATA:  Newly diagnosed left breast cancer EXAM: CT CHEST, ABDOMEN, AND PELVIS WITH CONTRAST TECHNIQUE: Multidetector CT imaging of the chest, abdomen and pelvis was performed following the standard protocol during bolus administration of intravenous contrast. CONTRAST:  167m ISOVUE-300 IOPAMIDOL (ISOVUE-300) INJECTION 61% COMPARISON:  None. FINDINGS: CT CHEST FINDINGS Cardiovascular: Heart is normal in size.  No pericardial effusion. No evidence of thoracic aortic aneurysm. Mild atherosclerotic calcifications of the aortic arch. Mediastinum/Nodes: Lower outer left breast mass with indwelling surgical clip (series 2/image 30), corresponding to the patient's known primary breast neoplasm. Multiple left axillary lymph nodes (series 2/images 10-20), measuring up to 18 mm short axis, compatible with nodal metastases. No suspicious mediastinal, hilar, or axillary lymphadenopathy. Lungs/Pleura: Mild centrilobular and paraseptal emphysematous changes. 9 mm calcified granuloma in the posterior right lower lobe (series 3/image 112), benign. 4 mm pleural-based nodule in the anterior right lower lobe along the major fissure (series 3/image 101), likely benign given the perifissural location. Additional 3 x 6 mm flat nodule along the lateral right lower lobe (series 3/image 105), also pleural-based and likely benign. No suspicious pulmonary nodules. Mild compressive atelectasis along the medial right lower lobe. No focal consolidation. No pleural effusion or pneumothorax. Musculoskeletal: Mild degenerative changes of the lower thoracic spine. CT ABDOMEN PELVIS FINDINGS  Hepatobiliary: Liver is within normal limits. Gallbladder is unremarkable. No intrahepatic or extrahepatic ductal dilatation. Pancreas: Within normal limits. Spleen: Within normal limits. Adrenals/Urinary Tract: Adrenal glands are within normal limits. 1.6 cm cyst in the posterior left upper kidney (series 9/image 11). 4.3 cm cyst in the lateral left upper kidney (series 9/image 16). Right kidney is within normal limits. No hydronephrosis. Bladder is within normal limits. Stomach/Bowel: Stomach is within normal limits. No evidence of bowel obstruction. Normal appendix (series 2/image 87). Mild left colonic diverticulosis, without evidence of diverticulitis. Vascular/Lymphatic: No evidence of abdominal aortic aneurysm. Mild atherosclerotic calcifications the bilateral iliac arteries. No suspicious abdominopelvic lymphadenopathy. Reproductive: Uterus is within normal limits. Bilateral ovaries are within normal limits. Other: No abdominopelvic ascites. Musculoskeletal: Mild degenerative changes of the lumbar spine. IMPRESSION: Lower outer left breast mass, corresponding to the patient's known primary breast neoplasm. Left axillary nodal metastases, measuring up to 1.8 cm short axis. Otherwise, no findings suspicious for metastatic disease. Two pleural-based nodules measuring up to 5 mm (mean diameter), as described above, not considered suspicious for metastases. Consider six-month follow-up CT chest to confirm stability, as clinically warranted. Please note that Fleischner Society guidelines do not apply. Electronically Signed   By: SJulian HyM.D.   On: 08/02/2017 08:50   Nm Cardiac Muga Rest  Result Date: 08/02/2017 CLINICAL DATA:  LEFT breast cancer, pre cardiotoxic chemotherapy EXAM: NUCLEAR MEDICINE CARDIAC BLOOD POOL IMAGING (MUGA) TECHNIQUE: Cardiac multi-gated acquisition was performed at rest following intravenous injection of Tc-946mabeled red blood cells. RADIOPHARMACEUTICALS:  23.75 mCi [id number] in-vitro labeled red blood cells IV COMPARISON:  None FINDINGS: Calculated LEFT ventricular ejection fraction is 61%, normal. Study was obtained at a cardiac rate of 64 bpm. Patient was mildly a rhythmic  during imaging. Cine analysis of the LEFT ventricle in 3 projections demonstrates no focal LEFT ventricular wall motion abnormalities. IMPRESSION: Normal LEFT ventricular ejection fraction of 61%. Normal LEFT ventricular wall motion. Electronically Signed   By: Lavonia Dana M.D.   On: 08/02/2017 15:45   Ct Abdomen Pelvis W Contrast  Result Date: 08/02/2017 CLINICAL DATA:  Newly diagnosed left breast cancer EXAM: CT CHEST, ABDOMEN, AND PELVIS WITH CONTRAST TECHNIQUE: Multidetector CT imaging of the chest, abdomen and pelvis was performed following the standard protocol during bolus administration of intravenous contrast. CONTRAST:  165m ISOVUE-300 IOPAMIDOL (ISOVUE-300) INJECTION 61% COMPARISON:  None. FINDINGS: CT CHEST FINDINGS Cardiovascular: Heart is normal in size.  No pericardial effusion. No evidence of thoracic aortic aneurysm. Mild atherosclerotic calcifications of the aortic arch. Mediastinum/Nodes: Lower outer left breast mass with indwelling surgical clip (series 2/image 30), corresponding to the patient's known primary breast neoplasm. Multiple left axillary lymph nodes (series 2/images 10-20), measuring up to 18 mm short axis, compatible with nodal metastases. No suspicious mediastinal, hilar, or axillary lymphadenopathy. Lungs/Pleura: Mild centrilobular and paraseptal emphysematous changes. 9 mm calcified granuloma in the posterior right lower lobe (series 3/image 112), benign. 4 mm pleural-based nodule in the anterior right lower lobe along the major fissure (series 3/image 101), likely benign given the perifissural location. Additional 3 x 6 mm flat nodule along the lateral right lower lobe (series 3/image 105), also pleural-based and likely benign. No suspicious pulmonary nodules.  Mild compressive atelectasis along the medial right lower lobe. No focal consolidation. No pleural effusion or pneumothorax. Musculoskeletal: Mild degenerative changes of the lower thoracic spine. CT ABDOMEN PELVIS FINDINGS Hepatobiliary: Liver is within normal limits. Gallbladder is unremarkable. No intrahepatic or extrahepatic ductal dilatation. Pancreas: Within normal limits. Spleen: Within normal limits. Adrenals/Urinary Tract: Adrenal glands are within normal limits. 1.6 cm cyst in the posterior left upper kidney (series 9/image 11). 4.3 cm cyst in the lateral left upper kidney (series 9/image 16). Right kidney is within normal limits. No hydronephrosis. Bladder is within normal limits. Stomach/Bowel: Stomach is within normal limits. No evidence of bowel obstruction. Normal appendix (series 2/image 87). Mild left colonic diverticulosis, without evidence of diverticulitis. Vascular/Lymphatic: No evidence of abdominal aortic aneurysm. Mild atherosclerotic calcifications the bilateral iliac arteries. No suspicious abdominopelvic lymphadenopathy. Reproductive: Uterus is within normal limits. Bilateral ovaries are within normal limits. Other: No abdominopelvic ascites. Musculoskeletal: Mild degenerative changes of the lumbar spine. IMPRESSION: Lower outer left breast mass, corresponding to the patient's known primary breast neoplasm. Left axillary nodal metastases, measuring up to 1.8 cm short axis. Otherwise, no findings suspicious for metastatic disease. Two pleural-based nodules measuring up to 5 mm (mean diameter), as described above, not considered suspicious for metastases. Consider six-month follow-up CT chest to confirm stability, as clinically warranted. Please note that Fleischner Society guidelines do not apply. Electronically Signed   By: SJulian HyM.D.   On: 08/02/2017 08:50   Dg Chest Port 1 View  Result Date: 08/07/2017 CLINICAL DATA:  Breast cancer and status post Port-A-Cath placement.  EXAM: PORTABLE CHEST 1 VIEW COMPARISON:  Chest radiograph 02/17/2015 FINDINGS: Right subclavian Port-A-Cath has been placed. Catheter tip in the expected region of the SVC. Negative for a pneumothorax. Lungs are clear. Heart size is normal. IMPRESSION: Right subclavian Port-A-Cath.  Catheter tip in the SVC. Negative for pneumothorax. Electronically Signed   By: AMarkus DaftM.D.   On: 08/07/2017 15:44   Dg C-arm 1-60 Min-no Report  Result Date: 08/07/2017 Fluoroscopy was utilized by the  requesting physician.  No radiographic interpretation.     Assessment and plan- Patient is a 67 y.o. female who presents for continued left ear pain.  She was recently diagnosed and treated for sinusitis and left otitis media with Augmentin for 10 days and albuterol inhaler for wheezing.   1.  Stage II ER PR positive HER-2 negative invasive carcinoma of the upper quadrant of the left breast: Status post cycle 1 of 4C and Taxol with Neulasta given on 08/08/2017.  She will require XRT followed by an aromatase inhibitor after completion of her treatments.  CT scans did not reveal any metastatic findings.  Return to clinic as scheduled 08/22/17 with labs MD assessment and cycle 2 of AC + Taxol.  2.  Acute sinusitis with injected left TM: Left ear continues to be painful but is improved since last week.  Improvement of TM erythema.  Encourage patient to continue antibiotics as prescribed.  She is also instructed to not placed Q-tips in ear until it is fully healed. RTC if otalgia worsens.  3.  Right upper quadrant wheezing:  improved with antibiotic and inhaler.   Visit Diagnosis 1. Non-recurrent acute allergic otitis media of left ear     Patient expressed understanding and was in agreement with this plan. She also understands that She can call clinic at any time with any questions, concerns, or complaints.   Greater than 50% was spent in counseling and coordination of care with this patient including but not limited to  discussion of the relevant topics above (See A&P) including, but not limited to diagnosis and management of acute and chronic medical conditions.    Faythe Casa, AGNP-C [facility]- [phone] Pager- [phone] 08/16/2017 12:51 PM

## 2017-08-16 NOTE — Telephone Encounter (Signed)
Called patient to discuss her symptoms. Tmax 99.7. She feels overall her sinus infection and ear infection are improving. She continues Augmentin (was given 10 day course last week). She was concerned that elevated temp may be related to salad she ate at restaurant yesterday. No new symptoms, no diarrhea/nausea/vomiting and overall improved. Discussed that this wouldn't necessarily be considered a fever. Discussed fever criteria. Not neutropenic at last lab check. Recommended she drink plenty of fluids, rest, and complete course of prescribed antibiotics. Advised that she could use Tylenol 650mg  and not to exceed 3 gram in 24 hours for fever/aches/etc. Advised patient to call clinic if worsening symptoms or we could see her in Symptom Management next week if needed. She is scheduled to f/u w/ Dr. Grayland Ormond and have labs on 8/8.

## 2017-08-19 NOTE — Progress Notes (Signed)
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  INTERVAL HISTORY: Patient returns to clinic today for further evaluation and consideration of cycle 2 of 4 of Adriamycin and Cytoxan.  She tolerated her first treatment relatively well other than an ear infection which is now resolved and she is completed her antibiotics.  She currently feels well.  She continues to be anxious.  She has no neurologic complaints. She has a good appetite and denies weight loss.  She has no chest pain or shortness of breath.  She denies any nausea, vomiting, constipation, or diarrhea.  She has no urinary complaints.  Patient offers no further specific complaints today.  REVIEW OF SYSTEMS:   Review of Systems  Constitutional: Negative.  Negative for fever, malaise/fatigue and weight loss.  Respiratory: Negative.  Negative for cough and shortness of breath.   Cardiovascular: Negative.  Negative for chest pain and leg swelling.  Gastrointestinal: Negative.  Negative for abdominal pain and constipation.  Genitourinary: Negative.  Negative for dysuria.  Musculoskeletal: Negative.  Negative for back pain.  Skin: Negative.  Negative for rash.  Neurological: Negative.  Negative for focal weakness, weakness and headaches.  Psychiatric/Behavioral: The patient is nervous/anxious.     As per HPI. Otherwise, a complete review of systems is negative.  PAST MEDICAL HISTORY: Past Medical History:  Diagnosis Date  . Anxiety   . Depression   . Hypertension   . Hypothyroidism   . Rapid heart rate   . Thyroid disease     PAST SURGICAL HISTORY: Past  Surgical History:  Procedure Laterality Date  . AXILLARY LYMPH NODE BIOPSY Left 07/16/2017   METASTATIC MAMMARY CARCINOMA  . BREAST BIOPSY Left 07/16/2017   [place] bx of left breast mass and left breast LN.  INVASIVE MAMMARY CARCINOMA, NO SPECIAL TYPE.   Marland Kitchen BREAST EXCISIONAL BIOPSY Right 2001  . BREAST LUMPECTOMY Right    2001  . COLONOSCOPY    . PORTACATH PLACEMENT Right 08/07/2017   Procedure: INSERTION PORT-A-CATH;  Surgeon: Robert Bellow, MD;  Location: [facility] ORS;  Service: General;  Laterality: Right;  . TONSILLECTOMY      FAMILY HISTORY: Family History  Problem Relation Age of Onset  . Stroke Mother   . Thyroid disease Mother   . Renal Disease Mother   . Stroke Father   . Heart attack Father   . Sudden death Father 42       suicide  . Anuerysm Brother   . Breast cancer Neg Hx     ADVANCED DIRECTIVES (Y/N):  N  HEALTH MAINTENANCE: Social History   Tobacco Use  . Smoking status: Current Some Day Smoker    Packs/day: 0.50    Years: 30.00    Pack years: 15.00    Types: Cigarettes  . Smokeless tobacco: Never Used  Substance Use Topics  . Alcohol use: Yes    Comment: occasional q 35mo  . Drug use: Never     Colonoscopy:  PAP:  Bone density:  Lipid panel:  No Known Allergies  Current Outpatient Medications  Medication Sig Dispense Refill  . albuterol (PROVENTIL HFA;VENTOLIN HFA) 108 (90 Base) MCG/ACT inhaler Inhale 2 puffs into the  lungs every 6 (six) hours as needed for wheezing or shortness of breath. 1 Inhaler 2  . ALPRAZolam (XANAX) 0.25 MG tablet Take 0.25 mg by mouth 2 (two) times daily.     Marland Kitchen amLODipine (NORVASC) 5 MG tablet Take 5 mg by mouth daily.    Marland Kitchen aspirin EC 81 MG tablet Take 81 mg by mouth daily.    Marland Kitchen levothyroxine (SYNTHROID, LEVOTHROID) 100 MCG tablet Take 100 mcg by mouth daily before breakfast.    . lidocaine-prilocaine (EMLA) cream Apply to affected area once (Patient taking differently: Apply 1 application topically once. Used when port  is accessed) 30 g 3  . LYSINE PO Take by mouth as needed.    . Potassium 99 MG TABS Take 99 mg by mouth daily.    . traZODone (DESYREL) 50 MG tablet Take 50 mg by mouth at bedtime.    Marland Kitchen HYDROcodone-acetaminophen (NORCO/VICODIN) 5-325 MG tablet Take 1 tablet by mouth every 4 (four) hours as needed for moderate pain. (Patient not taking: Reported on 08/22/2017) 10 tablet 0  . ondansetron (ZOFRAN) 8 MG tablet Take 1 tablet (8 mg total) by mouth 2 (two) times daily as needed. Start on the third day after chemotherapy. (Patient not taking: Reported on 08/22/2017) 30 tablet 2  . prochlorperazine (COMPAZINE) 10 MG tablet TAKE 1 TABLET(10 MG) BY MOUTH EVERY 6 HOURS AS NEEDED FOR NAUSEA OR VOMITING (Patient not taking: Reported on 08/22/2017) 360 tablet 2   No current facility-administered medications for this visit.     OBJECTIVE: Vitals:   08/22/17 0902  BP: 116/72  Pulse: 74  Resp: 18  Temp: (!) 97.5 F (36.4 C)     Body mass index is 35.28 kg/m.    ECOG FS:0 - Asymptomatic  General: Well-developed, well-nourished, no acute distress. Eyes: Pink conjunctiva, anicteric sclera. HEENT: Normocephalic, moist mucous membranes. Breast: Easily palpable 2 to 3 cm breast mass.  Exam deferred today. Lungs: Clear to auscultation bilaterally. Heart: Regular rate and rhythm. No rubs, murmurs, or gallops. Abdomen: Soft, nontender, nondistended. No organomegaly noted, normoactive bowel sounds. Musculoskeletal: No edema, cyanosis, or clubbing. Neuro: Alert, answering all questions appropriately. Cranial nerves grossly intact. Skin: No rashes or petechiae noted. Psych: Normal affect.  LAB RESULTS:  Lab Results  Component Value Date   NA 138 08/22/2017   K 3.9 08/22/2017   CL 106 08/22/2017   CO2 22 08/22/2017   GLUCOSE 113 (H) 08/22/2017   BUN 14 08/22/2017   CREATININE 0.96 08/22/2017   CALCIUM 9.1 08/22/2017   PROT 7.0 08/22/2017   ALBUMIN 3.8 08/22/2017   AST 22 08/22/2017   ALT 18 08/22/2017    ALKPHOS 114 08/22/2017   BILITOT 0.3 08/22/2017   GFRNONAA 59 (L) 08/22/2017   GFRAA >60 08/22/2017    Lab Results  Component Value Date   WBC 8.2 08/22/2017   NEUTROABS 6.1 08/22/2017   HGB 12.5 08/22/2017   HCT 36.8 08/22/2017   MCV 87.5 08/22/2017   PLT 328 08/22/2017     STUDIES: Ct Chest W Contrast  Result Date: 08/02/2017 CLINICAL DATA:  Newly diagnosed left breast cancer EXAM: CT CHEST, ABDOMEN, AND PELVIS WITH CONTRAST TECHNIQUE: Multidetector CT imaging of the chest, abdomen and pelvis was performed following the standard protocol during bolus administration of intravenous contrast. CONTRAST:  134m ISOVUE-300 IOPAMIDOL (ISOVUE-300) INJECTION 61% COMPARISON:  None. FINDINGS: CT CHEST FINDINGS Cardiovascular: Heart is normal in size.  No pericardial effusion. No evidence of thoracic aortic aneurysm. Mild atherosclerotic calcifications of the  aortic arch. Mediastinum/Nodes: Lower outer left breast mass with indwelling surgical clip (series 2/image 30), corresponding to the patient's known primary breast neoplasm. Multiple left axillary lymph nodes (series 2/images 10-20), measuring up to 18 mm short axis, compatible with nodal metastases. No suspicious mediastinal, hilar, or axillary lymphadenopathy. Lungs/Pleura: Mild centrilobular and paraseptal emphysematous changes. 9 mm calcified granuloma in the posterior right lower lobe (series 3/image 112), benign. 4 mm pleural-based nodule in the anterior right lower lobe along the major fissure (series 3/image 101), likely benign given the perifissural location. Additional 3 x 6 mm flat nodule along the lateral right lower lobe (series 3/image 105), also pleural-based and likely benign. No suspicious pulmonary nodules. Mild compressive atelectasis along the medial right lower lobe. No focal consolidation. No pleural effusion or pneumothorax. Musculoskeletal: Mild degenerative changes of the lower thoracic spine. CT ABDOMEN PELVIS FINDINGS  Hepatobiliary: Liver is within normal limits. Gallbladder is unremarkable. No intrahepatic or extrahepatic ductal dilatation. Pancreas: Within normal limits. Spleen: Within normal limits. Adrenals/Urinary Tract: Adrenal glands are within normal limits. 1.6 cm cyst in the posterior left upper kidney (series 9/image 11). 4.3 cm cyst in the lateral left upper kidney (series 9/image 16). Right kidney is within normal limits. No hydronephrosis. Bladder is within normal limits. Stomach/Bowel: Stomach is within normal limits. No evidence of bowel obstruction. Normal appendix (series 2/image 87). Mild left colonic diverticulosis, without evidence of diverticulitis. Vascular/Lymphatic: No evidence of abdominal aortic aneurysm. Mild atherosclerotic calcifications the bilateral iliac arteries. No suspicious abdominopelvic lymphadenopathy. Reproductive: Uterus is within normal limits. Bilateral ovaries are within normal limits. Other: No abdominopelvic ascites. Musculoskeletal: Mild degenerative changes of the lumbar spine. IMPRESSION: Lower outer left breast mass, corresponding to the patient's known primary breast neoplasm. Left axillary nodal metastases, measuring up to 1.8 cm short axis. Otherwise, no findings suspicious for metastatic disease. Two pleural-based nodules measuring up to 5 mm (mean diameter), as described above, not considered suspicious for metastases. Consider six-month follow-up CT chest to confirm stability, as clinically warranted. Please note that Fleischner Society guidelines do not apply. Electronically Signed   By: Julian Hy M.D.   On: 08/02/2017 08:50   Nm Cardiac Muga Rest  Result Date: 08/02/2017 CLINICAL DATA:  LEFT breast cancer, pre cardiotoxic chemotherapy EXAM: NUCLEAR MEDICINE CARDIAC BLOOD POOL IMAGING (MUGA) TECHNIQUE: Cardiac multi-gated acquisition was performed at rest following intravenous injection of Tc-51mlabeled red blood cells. RADIOPHARMACEUTICALS:  23.75 mCi Tc-950mpertechnetate in-vitro labeled red blood cells IV COMPARISON:  None FINDINGS: Calculated LEFT ventricular ejection fraction is 61%, normal. Study was obtained at a cardiac rate of 64 bpm. Patient was mildly a rhythmic during imaging. Cine analysis of the LEFT ventricle in 3 projections demonstrates no focal LEFT ventricular wall motion abnormalities. IMPRESSION: Normal LEFT ventricular ejection fraction of 61%. Normal LEFT ventricular wall motion. Electronically Signed   By: MaLavonia Dana.D.   On: 08/02/2017 15:45   Ct Abdomen Pelvis W Contrast  Result Date: 08/02/2017 CLINICAL DATA:  Newly diagnosed left breast cancer EXAM: CT CHEST, ABDOMEN, AND PELVIS WITH CONTRAST TECHNIQUE: Multidetector CT imaging of the chest, abdomen and pelvis was performed following the standard protocol during bolus administration of intravenous contrast. CONTRAST:  10074mSOVUE-300 IOPAMIDOL (ISOVUE-300) INJECTION 61% COMPARISON:  None. FINDINGS: CT CHEST FINDINGS Cardiovascular: Heart is normal in size.  No pericardial effusion. No evidence of thoracic aortic aneurysm. Mild atherosclerotic calcifications of the aortic arch. Mediastinum/Nodes: Lower outer left breast mass with indwelling surgical clip (series 2/image 30), corresponding to the  patient's known primary breast neoplasm. Multiple left axillary lymph nodes (series 2/images 10-20), measuring up to 18 mm short axis, compatible with nodal metastases. No suspicious mediastinal, hilar, or axillary lymphadenopathy. Lungs/Pleura: Mild centrilobular and paraseptal emphysematous changes. 9 mm calcified granuloma in the posterior right lower lobe (series 3/image 112), benign. 4 mm pleural-based nodule in the anterior right lower lobe along the major fissure (series 3/image 101), likely benign given the perifissural location. Additional 3 x 6 mm flat nodule along the lateral right lower lobe (series 3/image 105), also pleural-based and likely benign. No suspicious pulmonary nodules.  Mild compressive atelectasis along the medial right lower lobe. No focal consolidation. No pleural effusion or pneumothorax. Musculoskeletal: Mild degenerative changes of the lower thoracic spine. CT ABDOMEN PELVIS FINDINGS Hepatobiliary: Liver is within normal limits. Gallbladder is unremarkable. No intrahepatic or extrahepatic ductal dilatation. Pancreas: Within normal limits. Spleen: Within normal limits. Adrenals/Urinary Tract: Adrenal glands are within normal limits. 1.6 cm cyst in the posterior left upper kidney (series 9/image 11). 4.3 cm cyst in the lateral left upper kidney (series 9/image 16). Right kidney is within normal limits. No hydronephrosis. Bladder is within normal limits. Stomach/Bowel: Stomach is within normal limits. No evidence of bowel obstruction. Normal appendix (series 2/image 87). Mild left colonic diverticulosis, without evidence of diverticulitis. Vascular/Lymphatic: No evidence of abdominal aortic aneurysm. Mild atherosclerotic calcifications the bilateral iliac arteries. No suspicious abdominopelvic lymphadenopathy. Reproductive: Uterus is within normal limits. Bilateral ovaries are within normal limits. Other: No abdominopelvic ascites. Musculoskeletal: Mild degenerative changes of the lumbar spine. IMPRESSION: Lower outer left breast mass, corresponding to the patient's known primary breast neoplasm. Left axillary nodal metastases, measuring up to 1.8 cm short axis. Otherwise, no findings suspicious for metastatic disease. Two pleural-based nodules measuring up to 5 mm (mean diameter), as described above, not considered suspicious for metastases. Consider six-month follow-up CT chest to confirm stability, as clinically warranted. Please note that Fleischner Society guidelines do not apply. Electronically Signed   By: Julian Hy M.D.   On: 08/02/2017 08:50   Dg Chest Port 1 View  Result Date: 08/07/2017 CLINICAL DATA:  Breast cancer and status post Port-A-Cath placement.  EXAM: PORTABLE CHEST 1 VIEW COMPARISON:  Chest radiograph 02/17/2015 FINDINGS: Right subclavian Port-A-Cath has been placed. Catheter tip in the expected region of the SVC. Negative for a pneumothorax. Lungs are clear. Heart size is normal. IMPRESSION: Right subclavian Port-A-Cath.  Catheter tip in the SVC. Negative for pneumothorax. Electronically Signed   By: Markus Daft M.D.   On: 08/07/2017 15:44   Dg C-arm 1-60 Min-no Report  Result Date: 08/07/2017 Fluoroscopy was utilized by the requesting physician.  No radiographic interpretation.    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  PLAN:    1. 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  Case was also discussed extensively at case conference.  Given the size and the stage of patient's malignancy, she will benefit from neoadjuvant chemotherapy using Adriamycin, Cytoxan, and Taxol.  Patient will also require Neulasta support.  Patient will likely require adjuvant XRT followed by an aromatase inhibitor for 5 years at the conclusion of her treatments.  CT scan of the chest, abdomen, pelvis completed on August 01, 2017 did not reveal any metastatic disease.  MUGA scan on August 02, 2017 revealed EF of 61% which is adequate to  proceed with treatment.  Proceed with cycle 2 of 4 of Adriamycin and Cytoxan.  Return to clinic tomorrow for [place] and then in 2 weeks for further evaluation and consideration of cycle 3. 2.  Ear infection: Resolved.  Patient has now completed her antibiotics.  I spent a total of 30 minutes face-to-face with the patient of which greater than 50% of the visit was spent in counseling and coordination of care as detailed above.     Patient expressed understanding and was in agreement with this plan. She also understands that She can  call clinic at any time with any questions, concerns, or complaints.   Cancer Staging Breast cancer, stage 2, left ([place]) Staging form: Breast, AJCC 8th Edition - Clinical stage from 07/30/2017: Stage IIA (cT2, cN1, cM0, G2, ER+, PR+, HER2-) - Signed by Lloyd Huger, MD on 07/30/2017   Lloyd Huger, MD   08/25/2017 7:57 AM

## 2017-08-22 ENCOUNTER — Inpatient Hospital Stay (HOSPITAL_BASED_OUTPATIENT_CLINIC_OR_DEPARTMENT_OTHER): Payer: Medicare Other | Admitting: Oncology

## 2017-08-22 ENCOUNTER — Encounter: Payer: Self-pay | Admitting: Oncology

## 2017-08-22 ENCOUNTER — Inpatient Hospital Stay: Payer: Medicare Other

## 2017-08-22 VITALS — BP 116/72 | HR 74 | Temp 97.5°F | Resp 18 | Wt 218.6 lb

## 2017-08-22 DIAGNOSIS — Z170 Estrogen receptor positive status [ER+]: Secondary | ICD-10-CM

## 2017-08-22 DIAGNOSIS — J018 Other acute sinusitis: Secondary | ICD-10-CM | POA: Diagnosis not present

## 2017-08-22 DIAGNOSIS — F419 Anxiety disorder, unspecified: Secondary | ICD-10-CM | POA: Diagnosis not present

## 2017-08-22 DIAGNOSIS — F17210 Nicotine dependence, cigarettes, uncomplicated: Secondary | ICD-10-CM

## 2017-08-22 DIAGNOSIS — I10 Essential (primary) hypertension: Secondary | ICD-10-CM

## 2017-08-22 DIAGNOSIS — Z5111 Encounter for antineoplastic chemotherapy: Secondary | ICD-10-CM | POA: Diagnosis not present

## 2017-08-22 DIAGNOSIS — C50412 Malignant neoplasm of upper-outer quadrant of left female breast: Secondary | ICD-10-CM | POA: Diagnosis not present

## 2017-08-22 DIAGNOSIS — C50912 Malignant neoplasm of unspecified site of left female breast: Secondary | ICD-10-CM

## 2017-08-22 DIAGNOSIS — H65112 Acute and subacute allergic otitis media (mucoid) (sanguinous) (serous), left ear: Secondary | ICD-10-CM | POA: Diagnosis not present

## 2017-08-22 LAB — COMPREHENSIVE METABOLIC PANEL
ALT: 18 U/L (ref 0–44)
AST: 22 U/L (ref 15–41)
Albumin: 3.8 g/dL (ref 3.5–5.0)
Alkaline Phosphatase: 114 U/L (ref 38–126)
Anion gap: 10 (ref 5–15)
BUN: 14 mg/dL (ref 8–23)
CO2: 22 mmol/L (ref 22–32)
Calcium: 9.1 mg/dL (ref 8.9–10.3)
Chloride: 106 mmol/L (ref 98–111)
Creatinine, Ser: 0.96 mg/dL (ref 0.44–1.00)
GFR calc Af Amer: >60 mL/min (ref >60)
GFR calc non Af Amer: 59 mL/min — ABNORMAL LOW (ref >60)
Glucose, Bld: 113 mg/dL — ABNORMAL HIGH (ref 70–99)
Potassium: 3.9 mmol/L (ref 3.5–5.1)
Sodium: 138 mmol/L (ref 135–145)
Total Bilirubin: 0.3 mg/dL (ref 0.3–1.2)
Total Protein: 7 g/dL (ref 6.5–8.1)

## 2017-08-22 LAB — CBC WITH DIFFERENTIAL/PLATELET
Basophils Absolute: 0.1 10*3/uL (ref 0–0.1)
Basophils Relative: 1 %
Eosinophils Absolute: 0.1 10*3/uL (ref 0–0.7)
Eosinophils Relative: 1 %
HCT: 36.8 % (ref 35.0–47.0)
Hemoglobin: 12.5 g/dL (ref 12.0–16.0)
Lymphocytes Relative: 12 %
Lymphs Abs: 0.9 10*3/uL — ABNORMAL LOW (ref 1.0–3.6)
MCH: 29.8 pg (ref 26.0–34.0)
MCHC: 34.1 g/dL (ref 32.0–36.0)
MCV: 87.5 fL (ref 80.0–100.0)
Monocytes Absolute: 1 10*3/uL — ABNORMAL HIGH (ref 0.2–0.9)
Monocytes Relative: 12 %
Neutro Abs: 6.1 10*3/uL (ref 1.4–6.5)
Neutrophils Relative %: 74 %
Platelets: 328 10*3/uL (ref 150–440)
RBC: 4.2 MIL/uL (ref 3.80–5.20)
RDW: 13.6 % (ref 11.5–14.5)
WBC: 8.2 10*3/uL (ref 3.6–11.0)

## 2017-08-22 MED ORDER — HEPARIN SOD (PORK) LOCK FLUSH 100 UNIT/ML IV SOLN
500.0000 [IU] | Freq: Once | INTRAVENOUS | Status: AC
  Administered 2017-08-22: 500 [IU] via INTRAVENOUS
  Filled 2017-08-22: qty 5

## 2017-08-22 MED ORDER — DOXORUBICIN HCL CHEMO IV INJECTION 2 MG/ML
60.0000 mg/m2 | Freq: Once | INTRAVENOUS | Status: AC
  Administered 2017-08-22: 130 mg via INTRAVENOUS
  Filled 2017-08-22: qty 65

## 2017-08-22 MED ORDER — PALONOSETRON HCL INJECTION 0.25 MG/5ML
0.2500 mg | Freq: Once | INTRAVENOUS | Status: AC
  Administered 2017-08-22: 0.25 mg via INTRAVENOUS
  Filled 2017-08-22: qty 5

## 2017-08-22 MED ORDER — SODIUM CHLORIDE 0.9% FLUSH
10.0000 mL | INTRAVENOUS | Status: DC | PRN
  Administered 2017-08-22: 10 mL via INTRAVENOUS
  Filled 2017-08-22: qty 10

## 2017-08-22 MED ORDER — CYCLOPHOSPHAMIDE CHEMO INJECTION 1 GM
600.0000 mg/m2 | Freq: Once | INTRAMUSCULAR | Status: AC
  Administered 2017-08-22: 1300 mg via INTRAVENOUS
  Filled 2017-08-22: qty 50

## 2017-08-22 MED ORDER — SODIUM CHLORIDE 0.9 % IV SOLN
Freq: Once | INTRAVENOUS | Status: AC
  Administered 2017-08-22: 10:00:00 via INTRAVENOUS
  Filled 2017-08-22: qty 1000

## 2017-08-22 MED ORDER — SODIUM CHLORIDE 0.9 % IV SOLN
Freq: Once | INTRAVENOUS | Status: AC
  Administered 2017-08-22: 11:00:00 via INTRAVENOUS
  Filled 2017-08-22: qty 5

## 2017-08-22 NOTE — Progress Notes (Signed)
Pt in for follow up and AC today.  Finished antibiotic therapy for left ear infection.

## 2017-08-23 ENCOUNTER — Inpatient Hospital Stay: Payer: Medicare Other

## 2017-08-23 DIAGNOSIS — J018 Other acute sinusitis: Secondary | ICD-10-CM | POA: Diagnosis not present

## 2017-08-23 DIAGNOSIS — H65112 Acute and subacute allergic otitis media (mucoid) (sanguinous) (serous), left ear: Secondary | ICD-10-CM | POA: Diagnosis not present

## 2017-08-23 DIAGNOSIS — I10 Essential (primary) hypertension: Secondary | ICD-10-CM | POA: Diagnosis not present

## 2017-08-23 DIAGNOSIS — Z5111 Encounter for antineoplastic chemotherapy: Secondary | ICD-10-CM | POA: Diagnosis not present

## 2017-08-23 DIAGNOSIS — C50912 Malignant neoplasm of unspecified site of left female breast: Secondary | ICD-10-CM

## 2017-08-23 DIAGNOSIS — F17210 Nicotine dependence, cigarettes, uncomplicated: Secondary | ICD-10-CM | POA: Diagnosis not present

## 2017-08-23 DIAGNOSIS — C50412 Malignant neoplasm of upper-outer quadrant of left female breast: Secondary | ICD-10-CM | POA: Diagnosis not present

## 2017-08-23 MED ORDER — PEGFILGRASTIM-CBQV 6 MG/0.6ML ~~LOC~~ SOSY
6.0000 mg | PREFILLED_SYRINGE | Freq: Once | SUBCUTANEOUS | Status: AC
  Administered 2017-08-23: 6 mg via SUBCUTANEOUS

## 2017-09-03 NOTE — Progress Notes (Signed)
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  INTERVAL HISTORY: Patient returns to clinic today for further evaluation and consideration of cycle 3 of 4 of Adriamycin and Cytoxan.  She currently feels well and is asymptomatic.  She continues to be anxious.  She has no neurologic complaints. She has a good appetite and denies weight loss.  She has no chest pain or shortness of breath.  She denies any nausea, vomiting, constipation, or diarrhea.  She has no urinary complaints.  Patient offers no specific complaints today.  REVIEW OF SYSTEMS:   Review of Systems  Constitutional: Negative.  Negative for fever, malaise/fatigue and weight loss.  Respiratory: Negative.  Negative for cough and shortness of breath.   Cardiovascular: Negative.  Negative for chest pain and leg swelling.  Gastrointestinal: Negative.  Negative for abdominal pain and constipation.  Genitourinary: Negative.  Negative for dysuria.  Musculoskeletal: Negative.  Negative for back pain.  Skin: Negative.  Negative for rash.  Neurological: Negative.  Negative for focal weakness, weakness and headaches.  Psychiatric/Behavioral: The patient is nervous/anxious.     As per HPI. Otherwise, a complete review of systems is negative.  PAST MEDICAL HISTORY: Past Medical History:  Diagnosis Date  . Anxiety   . Depression   . Hypertension   . Hypothyroidism   . Rapid heart rate   . Thyroid disease     PAST SURGICAL HISTORY: Past Surgical History:  Procedure Laterality Date  . AXILLARY LYMPH NODE BIOPSY Left 07/16/2017   METASTATIC MAMMARY CARCINOMA  .  BREAST BIOPSY Left 07/16/2017   [place] bx of left breast mass and left breast LN.  INVASIVE MAMMARY CARCINOMA, NO SPECIAL TYPE.   Marland Kitchen BREAST EXCISIONAL BIOPSY Right 2001  . BREAST LUMPECTOMY Right    2001  . COLONOSCOPY    . PORTACATH PLACEMENT Right 08/07/2017   Procedure: INSERTION PORT-A-CATH;  Surgeon: Robert Bellow, MD;  Location: [facility] ORS;  Service: General;  Laterality: Right;  . TONSILLECTOMY      FAMILY HISTORY: Family History  Problem Relation Age of Onset  . Stroke Mother   . Thyroid disease Mother   . Renal Disease Mother   . Stroke Father   . Heart attack Father   . Sudden death Father 8       suicide  . Anuerysm Brother   . Breast cancer Neg Hx     ADVANCED DIRECTIVES (Y/N):  N  HEALTH MAINTENANCE: Social History   Tobacco Use  . Smoking status: Current Some Day Smoker    Packs/day: 0.50    Years: 30.00    Pack years: 15.00    Types: Cigarettes  . Smokeless tobacco: Never Used  Substance Use Topics  . Alcohol use: Yes    Comment: occasional q 63mo  . Drug use: Never     Colonoscopy:  PAP:  Bone density:  Lipid panel:  No Known Allergies  Current Outpatient Medications  Medication Sig Dispense Refill  . albuterol (PROVENTIL HFA;VENTOLIN HFA) 108 (90 Base) MCG/ACT inhaler Inhale 2 puffs into the lungs every 6 (six) hours as needed for wheezing or shortness of breath. 1 Inhaler 2  . ALPRAZolam (XANAX) 0.25  MG tablet Take 0.25 mg by mouth 2 (two) times daily.     Marland Kitchen amLODipine (NORVASC) 5 MG tablet Take 5 mg by mouth daily.    Marland Kitchen aspirin EC 81 MG tablet Take 81 mg by mouth daily.    Marland Kitchen levothyroxine (SYNTHROID, LEVOTHROID) 100 MCG tablet Take 100 mcg by mouth daily before breakfast.    . lidocaine-prilocaine (EMLA) cream Apply to affected area once (Patient taking differently: Apply 1 application topically once. Used when port is accessed) 30 g 3  . LYSINE PO Take by mouth as needed.    . Potassium 99 MG TABS Take 99 mg by mouth daily.    .  prochlorperazine (COMPAZINE) 10 MG tablet TAKE 1 TABLET(10 MG) BY MOUTH EVERY 6 HOURS AS NEEDED FOR NAUSEA OR VOMITING 360 tablet 2  . traZODone (DESYREL) 50 MG tablet Take 50 mg by mouth at bedtime.    Marland Kitchen HYDROcodone-acetaminophen (NORCO/VICODIN) 5-325 MG tablet Take 1 tablet by mouth every 4 (four) hours as needed for moderate pain. (Patient not taking: Reported on 08/22/2017) 10 tablet 0  . ondansetron (ZOFRAN) 8 MG tablet Take 1 tablet (8 mg total) by mouth 2 (two) times daily as needed. Start on the third day after chemotherapy. (Patient not taking: Reported on 08/22/2017) 30 tablet 2   No current facility-administered medications for this visit.     OBJECTIVE: Vitals:   09/05/17 0853  BP: 105/69  Pulse: 81  Resp: 18  Temp: (!) 96.5 F (35.8 C)     Body mass index is 35.02 kg/m.    ECOG FS:0 - Asymptomatic  General: Well-developed, well-nourished, no acute distress. Eyes: Pink conjunctiva, anicteric sclera. HEENT: Normocephalic, moist mucous membranes. Breast: Exam deferred today. Lungs: Clear to auscultation bilaterally. Heart: Regular rate and rhythm. No rubs, murmurs, or gallops. Abdomen: Soft, nontender, nondistended. No organomegaly noted, normoactive bowel sounds. Musculoskeletal: No edema, cyanosis, or clubbing. Neuro: Alert, answering all questions appropriately. Cranial nerves grossly intact. Skin: No rashes or petechiae noted. Psych: Normal affect.  LAB RESULTS:  Lab Results  Component Value Date   NA 139 09/05/2017   K 4.1 09/05/2017   CL 107 09/05/2017   CO2 21 (L) 09/05/2017   GLUCOSE 95 09/05/2017   BUN 14 09/05/2017   CREATININE 0.96 09/05/2017   CALCIUM 9.3 09/05/2017   PROT 7.0 09/05/2017   ALBUMIN 4.0 09/05/2017   AST 21 09/05/2017   ALT 16 09/05/2017   ALKPHOS 118 09/05/2017   BILITOT 0.5 09/05/2017   GFRNONAA 59 (L) 09/05/2017   GFRAA >60 09/05/2017    Lab Results  Component Value Date   WBC 8.8 09/05/2017   NEUTROABS 6.6 (H) 09/05/2017    HGB 11.9 (L) 09/05/2017   HCT 34.9 (L) 09/05/2017   MCV 87.3 09/05/2017   PLT 239 09/05/2017     STUDIES: Dg Chest Port 1 View  Result Date: 08/07/2017 CLINICAL DATA:  Breast cancer and status post Port-A-Cath placement. EXAM: PORTABLE CHEST 1 VIEW COMPARISON:  Chest radiograph 02/17/2015 FINDINGS: Right subclavian Port-A-Cath has been placed. Catheter tip in the expected region of the SVC. Negative for a pneumothorax. Lungs are clear. Heart size is normal. IMPRESSION: Right subclavian Port-A-Cath.  Catheter tip in the SVC. Negative for pneumothorax. Electronically Signed   By: Markus Daft M.D.   On: 08/07/2017 15:44   Dg C-arm 1-60 Min-no Report  Result Date: 08/07/2017 Fluoroscopy was utilized by the requesting physician.  No radiographic interpretation.    ASSESSMENT: Clinical stage IIA ER/PR positive, HER-2  negative invasive carcinoma of the upper out quadrant of the left breast.  PLAN:    1. 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  Case was also discussed extensively at case conference.  Given the size and the stage of patient's malignancy, she will benefit from neoadjuvant chemotherapy using Adriamycin, Cytoxan, and Taxol.  Patient will also require Udenyca support.  Patient will likely require adjuvant XRT followed by an aromatase inhibitor for 5 years at the conclusion of her treatments.  CT scan of the chest, abdomen, pelvis completed on August 01, 2017 did not reveal any metastatic disease.  MUGA scan on August 02, 2017 revealed EF of 61% which is adequate to proceed with treatment.  Proceed with cycle 3 of 4 of dose dense Adriamycin and Cytoxan.  Return to clinic tomorrow for Udenyca and then in 2 weeks for further evaluation and consideration of cycle 4.  2.  Anemia: Mild, monitor.  I spent a total of 30 minutes face-to-face with the patient of which greater than 50%  of the visit was spent in counseling and coordination of care as detailed above.      Patient expressed understanding and was in agreement with this plan. She also understands that She can call clinic at any time with any questions, concerns, or complaints.   Cancer Staging Breast cancer, stage 2, left ([place]) Staging form: Breast, AJCC 8th Edition - Clinical stage from 07/30/2017: Stage IIA (cT2, cN1, cM0, G2, ER+, PR+, HER2-) - Signed by Lloyd Huger, MD on 07/30/2017   Lloyd Huger, MD   09/05/2017 5:50 PM

## 2017-09-05 ENCOUNTER — Inpatient Hospital Stay (HOSPITAL_BASED_OUTPATIENT_CLINIC_OR_DEPARTMENT_OTHER): Payer: Medicare Other | Admitting: Oncology

## 2017-09-05 ENCOUNTER — Inpatient Hospital Stay: Payer: Medicare Other

## 2017-09-05 VITALS — BP 105/69 | HR 81 | Temp 96.5°F | Resp 18 | Wt 217.0 lb

## 2017-09-05 DIAGNOSIS — D649 Anemia, unspecified: Secondary | ICD-10-CM

## 2017-09-05 DIAGNOSIS — H65112 Acute and subacute allergic otitis media (mucoid) (sanguinous) (serous), left ear: Secondary | ICD-10-CM | POA: Diagnosis not present

## 2017-09-05 DIAGNOSIS — C50912 Malignant neoplasm of unspecified site of left female breast: Secondary | ICD-10-CM

## 2017-09-05 DIAGNOSIS — J018 Other acute sinusitis: Secondary | ICD-10-CM | POA: Diagnosis not present

## 2017-09-05 DIAGNOSIS — Z5111 Encounter for antineoplastic chemotherapy: Secondary | ICD-10-CM | POA: Diagnosis not present

## 2017-09-05 DIAGNOSIS — E860 Dehydration: Secondary | ICD-10-CM

## 2017-09-05 DIAGNOSIS — I10 Essential (primary) hypertension: Secondary | ICD-10-CM | POA: Diagnosis not present

## 2017-09-05 DIAGNOSIS — F17210 Nicotine dependence, cigarettes, uncomplicated: Secondary | ICD-10-CM | POA: Diagnosis not present

## 2017-09-05 DIAGNOSIS — C50412 Malignant neoplasm of upper-outer quadrant of left female breast: Secondary | ICD-10-CM | POA: Diagnosis not present

## 2017-09-05 DIAGNOSIS — F419 Anxiety disorder, unspecified: Secondary | ICD-10-CM

## 2017-09-05 DIAGNOSIS — Z170 Estrogen receptor positive status [ER+]: Secondary | ICD-10-CM | POA: Diagnosis not present

## 2017-09-05 LAB — COMPREHENSIVE METABOLIC PANEL
ALT: 16 U/L (ref 0–44)
AST: 21 U/L (ref 15–41)
Albumin: 4 g/dL (ref 3.5–5.0)
Alkaline Phosphatase: 118 U/L (ref 38–126)
Anion gap: 11 (ref 5–15)
BUN: 14 mg/dL (ref 8–23)
CO2: 21 mmol/L — ABNORMAL LOW (ref 22–32)
Calcium: 9.3 mg/dL (ref 8.9–10.3)
Chloride: 107 mmol/L (ref 98–111)
Creatinine, Ser: 0.96 mg/dL (ref 0.44–1.00)
GFR calc Af Amer: >60 mL/min (ref >60)
GFR calc non Af Amer: 59 mL/min — ABNORMAL LOW (ref >60)
Glucose, Bld: 95 mg/dL (ref 70–99)
Potassium: 4.1 mmol/L (ref 3.5–5.1)
Sodium: 139 mmol/L (ref 135–145)
Total Bilirubin: 0.5 mg/dL (ref 0.3–1.2)
Total Protein: 7 g/dL (ref 6.5–8.1)

## 2017-09-05 LAB — CBC WITH DIFFERENTIAL/PLATELET
Basophils Absolute: 0.1 10*3/uL (ref 0–0.1)
Basophils Relative: 1 %
Eosinophils Absolute: 0.1 10*3/uL (ref 0–0.7)
Eosinophils Relative: 1 %
HCT: 34.9 % — ABNORMAL LOW (ref 35.0–47.0)
Hemoglobin: 11.9 g/dL — ABNORMAL LOW (ref 12.0–16.0)
Lymphocytes Relative: 8 %
Lymphs Abs: 0.7 10*3/uL — ABNORMAL LOW (ref 1.0–3.6)
MCH: 29.8 pg (ref 26.0–34.0)
MCHC: 34.1 g/dL (ref 32.0–36.0)
MCV: 87.3 fL (ref 80.0–100.0)
Monocytes Absolute: 1.4 10*3/uL — ABNORMAL HIGH (ref 0.2–0.9)
Monocytes Relative: 15 %
Neutro Abs: 6.6 10*3/uL — ABNORMAL HIGH (ref 1.4–6.5)
Neutrophils Relative %: 75 %
Platelets: 239 10*3/uL (ref 150–440)
RBC: 3.99 MIL/uL (ref 3.80–5.20)
RDW: 14.2 % (ref 11.5–14.5)
WBC: 8.8 10*3/uL (ref 3.6–11.0)

## 2017-09-05 MED ORDER — SODIUM CHLORIDE 0.9 % IV SOLN
Freq: Once | INTRAVENOUS | Status: AC
  Administered 2017-09-05: 10:00:00 via INTRAVENOUS
  Filled 2017-09-05: qty 5

## 2017-09-05 MED ORDER — SODIUM CHLORIDE 0.9 % IV SOLN
600.0000 mg/m2 | Freq: Once | INTRAVENOUS | Status: AC
  Administered 2017-09-05: 1300 mg via INTRAVENOUS
  Filled 2017-09-05: qty 50

## 2017-09-05 MED ORDER — SODIUM CHLORIDE 0.9 % IV SOLN
Freq: Once | INTRAVENOUS | Status: AC
  Administered 2017-09-05: 10:00:00 via INTRAVENOUS
  Filled 2017-09-05: qty 250

## 2017-09-05 MED ORDER — PALONOSETRON HCL INJECTION 0.25 MG/5ML
0.2500 mg | Freq: Once | INTRAVENOUS | Status: AC
  Administered 2017-09-05: 0.25 mg via INTRAVENOUS
  Filled 2017-09-05: qty 5

## 2017-09-05 MED ORDER — SODIUM CHLORIDE 0.9% FLUSH
10.0000 mL | Freq: Once | INTRAVENOUS | Status: AC
  Administered 2017-09-05: 10 mL via INTRAVENOUS
  Filled 2017-09-05: qty 10

## 2017-09-05 MED ORDER — DOXORUBICIN HCL CHEMO IV INJECTION 2 MG/ML
60.0000 mg/m2 | Freq: Once | INTRAVENOUS | Status: AC
  Administered 2017-09-05: 130 mg via INTRAVENOUS
  Filled 2017-09-05: qty 65

## 2017-09-05 MED ORDER — HEPARIN SOD (PORK) LOCK FLUSH 100 UNIT/ML IV SOLN
500.0000 [IU] | Freq: Once | INTRAVENOUS | Status: AC
  Administered 2017-09-05: 500 [IU] via INTRAVENOUS
  Filled 2017-09-05: qty 5

## 2017-09-06 ENCOUNTER — Inpatient Hospital Stay: Payer: Medicare Other

## 2017-09-06 DIAGNOSIS — F17210 Nicotine dependence, cigarettes, uncomplicated: Secondary | ICD-10-CM | POA: Diagnosis not present

## 2017-09-06 DIAGNOSIS — C50412 Malignant neoplasm of upper-outer quadrant of left female breast: Secondary | ICD-10-CM | POA: Diagnosis not present

## 2017-09-06 DIAGNOSIS — C50912 Malignant neoplasm of unspecified site of left female breast: Secondary | ICD-10-CM

## 2017-09-06 DIAGNOSIS — H65112 Acute and subacute allergic otitis media (mucoid) (sanguinous) (serous), left ear: Secondary | ICD-10-CM | POA: Diagnosis not present

## 2017-09-06 DIAGNOSIS — Z5111 Encounter for antineoplastic chemotherapy: Secondary | ICD-10-CM | POA: Diagnosis not present

## 2017-09-06 DIAGNOSIS — J018 Other acute sinusitis: Secondary | ICD-10-CM | POA: Diagnosis not present

## 2017-09-06 DIAGNOSIS — I10 Essential (primary) hypertension: Secondary | ICD-10-CM | POA: Diagnosis not present

## 2017-09-06 MED ORDER — PEGFILGRASTIM-CBQV 6 MG/0.6ML ~~LOC~~ SOSY
6.0000 mg | PREFILLED_SYRINGE | Freq: Once | SUBCUTANEOUS | Status: AC
  Administered 2017-09-06: 6 mg via SUBCUTANEOUS

## 2017-09-14 ENCOUNTER — Other Ambulatory Visit: Payer: Self-pay

## 2017-09-14 ENCOUNTER — Emergency Department
Admission: EM | Admit: 2017-09-14 | Discharge: 2017-09-15 | Disposition: A | Discharge status: Home / Self Care | Payer: Medicare Other | Attending: Emergency Medicine | Admitting: Emergency Medicine

## 2017-09-14 ENCOUNTER — Emergency Department: Payer: Medicare Other

## 2017-09-14 DIAGNOSIS — F17210 Nicotine dependence, cigarettes, uncomplicated: Secondary | ICD-10-CM | POA: Insufficient documentation

## 2017-09-14 DIAGNOSIS — Z79899 Other long term (current) drug therapy: Secondary | ICD-10-CM | POA: Insufficient documentation

## 2017-09-14 DIAGNOSIS — R509 Fever, unspecified: Secondary | ICD-10-CM | POA: Diagnosis not present

## 2017-09-14 DIAGNOSIS — N3090 Cystitis, unspecified without hematuria: Secondary | ICD-10-CM | POA: Diagnosis not present

## 2017-09-14 DIAGNOSIS — E039 Hypothyroidism, unspecified: Secondary | ICD-10-CM | POA: Diagnosis not present

## 2017-09-14 DIAGNOSIS — I10 Essential (primary) hypertension: Secondary | ICD-10-CM | POA: Insufficient documentation

## 2017-09-14 DIAGNOSIS — J9809 Other diseases of bronchus, not elsewhere classified: Secondary | ICD-10-CM | POA: Diagnosis not present

## 2017-09-14 LAB — CBC WITH DIFFERENTIAL/PLATELET
Band Neutrophils: 3 %
Basophils Absolute: 0.1 10*3/uL (ref 0–0.1)
Basophils Relative: 1 %
Blasts: 0 %
Eosinophils Absolute: 0 10*3/uL (ref 0–0.7)
Eosinophils Relative: 0 %
HCT: 31.8 % — ABNORMAL LOW (ref 35.0–47.0)
Hemoglobin: 11 g/dL — ABNORMAL LOW (ref 12.0–16.0)
Lymphocytes Relative: 7 %
Lymphs Abs: 0.6 10*3/uL — ABNORMAL LOW (ref 1.0–3.6)
MCH: 30.2 pg (ref 26.0–34.0)
MCHC: 34.6 g/dL (ref 32.0–36.0)
MCV: 87.4 fL (ref 80.0–100.0)
Metamyelocytes Relative: 3 %
Monocytes Absolute: 1.2 10*3/uL — ABNORMAL HIGH (ref 0.2–0.9)
Monocytes Relative: 15 %
Myelocytes: 1 %
Neutro Abs: 6.1 10*3/uL (ref 1.4–6.5)
Neutrophils Relative %: 70 %
Other: 0 %
Platelets: 232 10*3/uL (ref 150–440)
Promyelocytes Relative: 0 %
RBC: 3.63 MIL/uL — ABNORMAL LOW (ref 3.80–5.20)
RDW: 14.8 % — ABNORMAL HIGH (ref 11.5–14.5)
WBC: 8 10*3/uL (ref 3.6–11.0)
nRBC: 2 /100 WBC — ABNORMAL HIGH

## 2017-09-14 LAB — URINALYSIS, COMPLETE (UACMP) WITH MICROSCOPIC
Bilirubin Urine: NEGATIVE
Glucose, UA: NEGATIVE mg/dL
Ketones, ur: NEGATIVE mg/dL
Nitrite: NEGATIVE
Protein, ur: 100 mg/dL — AB
RBC / HPF: >50 RBC/hpf — ABNORMAL HIGH (ref 0–5)
Specific Gravity, Urine: 1.013 (ref 1.005–1.030)
WBC, UA: >50 WBC/hpf — ABNORMAL HIGH (ref 0–5)
pH: 6 (ref 5.0–8.0)

## 2017-09-14 LAB — PROTIME-INR
INR: 1.04
Prothrombin Time: 13.5 seconds (ref 11.4–15.2)

## 2017-09-14 LAB — COMPREHENSIVE METABOLIC PANEL
ALT: 21 U/L (ref 0–44)
AST: 24 U/L (ref 15–41)
Albumin: 4.1 g/dL (ref 3.5–5.0)
Alkaline Phosphatase: 125 U/L (ref 38–126)
Anion gap: 9 (ref 5–15)
BUN: 16 mg/dL (ref 8–23)
CO2: 23 mmol/L (ref 22–32)
Calcium: 9.1 mg/dL (ref 8.9–10.3)
Chloride: 104 mmol/L (ref 98–111)
Creatinine, Ser: 0.94 mg/dL (ref 0.44–1.00)
GFR calc Af Amer: >60 mL/min (ref >60)
GFR calc non Af Amer: >60 mL/min (ref >60)
Glucose, Bld: 113 mg/dL — ABNORMAL HIGH (ref 70–99)
Potassium: 3.5 mmol/L (ref 3.5–5.1)
Sodium: 136 mmol/L (ref 135–145)
Total Bilirubin: 0.4 mg/dL (ref 0.3–1.2)
Total Protein: 6.9 g/dL (ref 6.5–8.1)

## 2017-09-14 LAB — LACTIC ACID, PLASMA: Lactic Acid, Venous: 1.5 mmol/L (ref 0.5–1.9)

## 2017-09-14 MED ORDER — CEPHALEXIN 500 MG PO CAPS: 500.0000 mg | ORAL_CAPSULE | Freq: Once | ORAL | Status: DC

## 2017-09-14 MED ORDER — SODIUM CHLORIDE 0.9 % IV BOLUS
1000.0000 mL | Freq: Once | INTRAVENOUS | Status: AC
  Administered 2017-09-14: 1000 mL via INTRAVENOUS

## 2017-09-14 NOTE — ED Notes (Signed)
Pt states port has been tender to touch. Pt states she feels like she has redness around port site. Port not accessed due to pt's concern. md notified. Pt states she has had a fever of 101 at home. Pt denies cough. Pt is being treated for breast cancer. Pt states she had chemo this week. Pt with dry, slightly pale skin. Pt complains of chills.

## 2017-09-14 NOTE — ED Triage Notes (Signed)
Reports running fever at home (101.1)  Last treatment was 1 week ago.

## 2017-09-14 NOTE — ED Notes (Signed)
Pt placed on protective precautions.

## 2017-09-14 NOTE — ED Notes (Signed)
md at bedside

## 2017-09-15 NOTE — Discharge Instructions (Addendum)
Your lab test today were okay.  Your white blood cell count was 8000.  You do have a urinary tract infection, so please continue taking the Bactrim as prescribed by your doctor.

## 2017-09-15 NOTE — ED Provider Notes (Signed)
[facility] Emergency Department Provider Note  ____________________________________________  Time seen: Approximately 12:45 AM  I have reviewed the triage vital signs and the nursing notes.   HISTORY  Chief Complaint Fever    HPI Alicia [name] is a 67 y.o. female with a history of anxiety depression hypertension and breast cancer, currently undergoing chemotherapy who complains of a fever started earlier today.  Highest was 101.  She denies any acute symptoms such as dysuria frequency urgency cough shortness of breath.  No belly pain vomiting or diarrhea.  Last chemo was 1 week ago.  She was prescribed Bactrim for a UTI by her doctor and took 1 dose earlier today at about 7 PM.  Sent to the ED to evaluate for neutropenia given the fever.      Past Medical History:  Diagnosis Date  . Anxiety   . Depression   . Hypertension   . Hypothyroidism   . Rapid heart rate   . Thyroid disease      Patient Active Problem List   Diagnosis Date Noted  . Breast cancer, stage 2, left ([place]) 07/24/2017  . Elevated troponin 02/17/2015     Past Surgical History:  Procedure Laterality Date  . AXILLARY LYMPH NODE BIOPSY Left 07/16/2017   METASTATIC MAMMARY CARCINOMA  . BREAST BIOPSY Left 07/16/2017   [place] bx of left breast mass and left breast LN.  INVASIVE MAMMARY CARCINOMA, NO SPECIAL TYPE.   Marland Kitchen BREAST EXCISIONAL BIOPSY Right 2001  . BREAST LUMPECTOMY Right    2001  . COLONOSCOPY    . PORTACATH PLACEMENT Right 08/07/2017   Procedure: INSERTION PORT-A-CATH;  Surgeon: Robert Bellow, MD;  Location: [facility] ORS;  Service: General;  Laterality: Right;  . TONSILLECTOMY       Prior to Admission medications   Medication Sig Start Date End Date Taking? Authorizing Provider  albuterol (PROVENTIL HFA;VENTOLIN HFA) 108 (90 Base) MCG/ACT inhaler Inhale 2 puffs into the lungs every 6 (six) hours as needed for wheezing or shortness of breath. 08/09/17   Jacquelin Hawking, NP  ALPRAZolam Duanne Moron) 0.25 MG tablet Take 0.25 mg by mouth 2 (two) times daily.     [provider]  amLODipine (NORVASC) 5 MG tablet Take 5 mg by mouth daily.    [provider]  aspirin EC 81 MG tablet Take 81 mg by mouth daily.    [provider]  HYDROcodone-acetaminophen (NORCO/VICODIN) 5-325 MG tablet Take 1 tablet by mouth every 4 (four) hours as needed for moderate pain. Patient not taking: Reported on 08/22/2017 08/07/17 08/07/18  Robert Bellow, MD  levothyroxine (SYNTHROID, LEVOTHROID) 100 MCG tablet Take 100 mcg by mouth daily before breakfast.    [provider]  lidocaine-prilocaine (EMLA) cream Apply to affected area once Patient taking differently: Apply 1 application topically once. Used when port is accessed 07/31/17   Lloyd Huger, MD  LYSINE PO Take by mouth as needed.    [provider]  ondansetron (ZOFRAN) 8 MG tablet Take 1 tablet (8 mg total) by mouth 2 (two) times daily as needed. Start on the third day after chemotherapy. Patient not taking: Reported on 08/22/2017 07/31/17   Lloyd Huger, MD  Potassium 99 MG TABS Take 99 mg by mouth daily.    [provider]  prochlorperazine (COMPAZINE) 10 MG tablet TAKE 1 TABLET(10 MG) BY MOUTH EVERY 6 HOURS AS NEEDED FOR NAUSEA OR VOMITING 07/31/17   Lloyd Huger, MD  traZODone ([place])  50 MG tablet Take 50 mg by mouth at bedtime.    [provider]     Allergies Patient has no known allergies.   Family History  Problem Relation Age of Onset  . Stroke Mother   . Thyroid disease Mother   . Renal Disease Mother   . Stroke Father   . Heart attack Father   . Sudden death Father 52       suicide  . Anuerysm Brother   . Breast cancer Neg Hx     Social History Social History   Tobacco Use  . Smoking status: Current Some Day Smoker    Packs/day: 0.50    Years: 30.00    Pack years: 15.00    Types: Cigarettes  . Smokeless tobacco: Never  Used  Substance Use Topics  . Alcohol use: Yes    Comment: occasional q 6mo   . Drug use: Never    Review of Systems  Constitutional:   Positive fever ENT:   No sore throat. No rhinorrhea. Cardiovascular:   No chest pain or syncope. Respiratory:   No dyspnea or cough. Gastrointestinal:   Negative for abdominal pain, vomiting and diarrhea.  Musculoskeletal:   Negative for focal pain or swelling All other systems reviewed and are negative except as documented above in ROS and HPI.  ____________________________________________   PHYSICAL EXAM:  VITAL SIGNS: ED Triage Vitals  Enc Vitals Group     BP 09/14/17 2132 115/79     Pulse Rate 09/14/17 2132 98     Resp 09/14/17 2132 19     Temp 09/14/17 2132 97.7 F (36.5 C)     Temp Source 09/14/17 2132 Oral     SpO2 09/14/17 2132 98 %     Weight --      Height 09/14/17 2133 5\' 6"  (1.676 m)     Head Circumference --      Peak Flow --      Pain Score 09/14/17 2133 0     Pain Loc --      Pain Edu? --      Excl. in [place]? --     Vital signs reviewed, nursing assessments reviewed.   Constitutional:   Alert and oriented. Non-toxic appearance. Eyes:   Conjunctivae are normal. EOMI. PERRL. ENT      Head:   Normocephalic and atraumatic.      Nose:   No congestion/rhinnorhea.       Mouth/Throat:   MMM, no pharyngeal erythema. No peritonsillar mass.       Neck:   No meningismus. Full ROM. Hematological/Lymphatic/Immunilogical:   No cervical lymphadenopathy. Cardiovascular:   RRR. Symmetric bilateral radial and DP pulses.  No murmurs. Cap refill less than 2 seconds. Respiratory:   Normal respiratory effort without tachypnea/retractions. Breath sounds are clear and equal bilaterally. No wheezes/rales/rhonchi. Gastrointestinal:   Soft and nontender. Non distended. There is no CVA tenderness.  No rebound, rigidity, or guarding.  Musculoskeletal:   Normal range of motion in all extremities. No joint effusions.  No lower extremity  tenderness.  No edema. Neurologic:   Normal speech and language.  Motor grossly intact. No acute focal neurologic deficits are appreciated.  Skin:    Skin is warm, dry and intact. No rash noted.  No petechiae, purpura, or bullae.  ____________________________________________    LABS (pertinent positives/negatives) (all labs ordered are listed, but only abnormal results are displayed) Labs Reviewed  COMPREHENSIVE METABOLIC PANEL - Abnormal; Notable for the following components:  Result Value   Glucose, Bld 113 (*)    All other components within normal limits  CBC WITH DIFFERENTIAL/PLATELET - Abnormal; Notable for the following components:   RBC 3.63 (*)    Hemoglobin 11.0 (*)    HCT 31.8 (*)    RDW 14.8 (*)    nRBC 2 (*)    Lymphs Abs 0.6 (*)    Monocytes Absolute 1.2 (*)    All other components within normal limits  URINALYSIS, COMPLETE (UACMP) WITH MICROSCOPIC - Abnormal; Notable for the following components:   Color, Urine YELLOW (*)    APPearance HAZY (*)    Hgb urine dipstick MODERATE (*)    Protein, ur 100 (*)    Leukocytes, UA MODERATE (*)    RBC / HPF >50 (*)    WBC, UA >50 (*)    Bacteria, UA RARE (*)    All other components within normal limits  CULTURE, BLOOD (ROUTINE X 2)  CULTURE, BLOOD (ROUTINE X 2)  URINE CULTURE  LACTIC ACID, PLASMA  PROTIME-INR   ____________________________________________   EKG  Interpreted by me Sinus rhythm rate of 84, normal axis intervals QRS ST segments and T waves  ____________________________________________    RADIOLOGY  Dg Chest 1 View  Result Date: 09/14/2017 CLINICAL DATA:  67 year old female with history of fever. History of cancer. EXAM: CHEST  1 VIEW COMPARISON:  Chest x-ray 08/07/2017. FINDINGS: Right subclavian single-lumen porta cath with tip terminating in the superior cavoatrial junction. Mild diffuse peribronchial cuffing. Lung volumes are low. No consolidative airspace disease. No pleural effusions.  No pneumothorax. No pulmonary nodule or mass noted. Pulmonary vasculature and the cardiomediastinal silhouette are within normal limits. Aortic atherosclerosis. IMPRESSION: 1. Mild diffuse peribronchial cuffing, concerning for acute bronchitis. 2. Aortic atherosclerosis. Electronically Signed   By: Vinnie Langton M.D.   On: 09/14/2017 22:25    ____________________________________________   PROCEDURES Procedures  ____________________________________________    CLINICAL IMPRESSION / ASSESSMENT AND PLAN / ED COURSE  Pertinent labs & imaging results that were available during my care of the patient were reviewed by me and considered in my medical decision making (see chart for details).    Patient presents with reported fever at home.  In the ED her vital signs are normal and exam is nonfocal.  Due to the history of fever at home and current chemotherapy use, will pursue a sepsis work-up although there is not currently any evident infectious source.  Clinical Course as of Sep 16 43  Sat Sep 14, 2017  2353 Workup shows UTI, otherwise unremarakble. VS normal in ED. No neutropenia. Exam unremarakble.  Suitable for outpatient follow up. Will start abx and send u. Cx.    [PS]    Clinical Course User Index [PS] Carrie Mew, MD     ----------------------------------------- 12:47 AM on 09/15/2017 -----------------------------------------  Patient has the Bactrim that her doctor already prescribed so I will continue that.  Follow-up with her doctor in 3 days on Tuesday.  ____________________________________________   FINAL CLINICAL IMPRESSION(S) / ED DIAGNOSES    Final diagnoses:  Fever, unspecified fever cause  Cystitis     ED Discharge Orders    None      Portions of this note were generated with dragon dictation software. Dictation errors may occur despite best attempts at proofreading.    Carrie Mew, MD 09/15/17 [phone]

## 2017-09-15 NOTE — ED Notes (Signed)
Pt ambulatory to wheel chair upon discharge. Pt and family verbalized understanding of discharge instructions, follow-up care and fever management. VSS. Skin warm and dry. A&O x4.

## 2017-09-16 NOTE — Progress Notes (Signed)
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  INTERVAL HISTORY: Patient returns to clinic today for further evaluation and consideration of cycle 404 of Adriamycin and Cytoxan.  She was diagnosed with a UTI over the weekend and was evaluated in the ER.  She continues on antibiotics and her fevers have resolved.  She is also complaining of increased cold sores on her lips and tongue.  She otherwise has felt well and is asymptomatic.  She continues to be anxious.  She has no neurologic complaints. She has a good appetite and denies weight loss.  She has no chest pain or shortness of breath.  She denies any nausea, vomiting, constipation, or diarrhea.  Her urinary complaints have resolved.  Patient offers no further specific complaints.  REVIEW OF SYSTEMS:   Review of Systems  Constitutional: Negative.  Negative for fever, malaise/fatigue and weight loss.  Respiratory: Negative.  Negative for cough and shortness of breath.   Cardiovascular: Negative.  Negative for chest pain and leg swelling.  Gastrointestinal: Negative.  Negative for abdominal pain and constipation.  Genitourinary: Negative.  Negative for dysuria.  Musculoskeletal: Negative.  Negative for back pain.  Skin: Negative.  Negative for rash.  Neurological: Negative.  Negative for focal weakness, weakness and headaches.  Psychiatric/Behavioral: The patient is nervous/anxious.     As per HPI. Otherwise, a complete review of systems is negative.  PAST MEDICAL HISTORY: Past Medical History:  Diagnosis Date  . Anxiety   . Depression   . Hypertension   .  Hypothyroidism   . Rapid heart rate   . Thyroid disease     PAST SURGICAL HISTORY: Past Surgical History:  Procedure Laterality Date  . AXILLARY LYMPH NODE BIOPSY Left 07/16/2017   METASTATIC MAMMARY CARCINOMA  . BREAST BIOPSY Left 07/16/2017   [place] bx of left breast mass and left breast LN.  INVASIVE MAMMARY CARCINOMA, NO SPECIAL TYPE.   Marland Kitchen BREAST EXCISIONAL BIOPSY Right 2001  . BREAST LUMPECTOMY Right    2001  . COLONOSCOPY    . PORTACATH PLACEMENT Right 08/07/2017   Procedure: INSERTION PORT-A-CATH;  Surgeon: Robert Bellow, MD;  Location: [facility] ORS;  Service: General;  Laterality: Right;  . TONSILLECTOMY      FAMILY HISTORY: Family History  Problem Relation Age of Onset  . Stroke Mother   . Thyroid disease Mother   . Renal Disease Mother   . Stroke Father   . Heart attack Father   . Sudden death Father 53       suicide  . Anuerysm Brother   . Breast cancer Neg Hx     ADVANCED DIRECTIVES (Y/N):  N  HEALTH MAINTENANCE: Social History   Tobacco Use  . Smoking status: Current Some Day Smoker    Packs/day: 0.50    Years: 30.00    Pack years: 15.00    Types: Cigarettes  . Smokeless tobacco: Never Used  Substance Use Topics  . Alcohol use: Yes    Comment: occasional q 39mo  . Drug use: Never     Colonoscopy:  PAP:  Bone density:  Lipid panel:  No Known Allergies  Current Outpatient Medications  Medication Sig  Dispense Refill  . ALPRAZolam (XANAX) 0.25 MG tablet Take 0.25 mg by mouth 2 (two) times daily.     Marland Kitchen amLODipine (NORVASC) 5 MG tablet Take 5 mg by mouth daily.    Marland Kitchen aspirin EC 81 MG tablet Take 81 mg by mouth daily.    Marland Kitchen levothyroxine (SYNTHROID, LEVOTHROID) 100 MCG tablet Take 100 mcg by mouth daily before breakfast.    . lidocaine-prilocaine (EMLA) cream Apply to affected area once (Patient taking differently: Apply 1 application topically once. Used when port is accessed) 30 g 3  . LYSINE PO Take by mouth as needed.    . Potassium 99 MG TABS  Take 99 mg by mouth daily.    . prochlorperazine (COMPAZINE) 10 MG tablet TAKE 1 TABLET(10 MG) BY MOUTH EVERY 6 HOURS AS NEEDED FOR NAUSEA OR VOMITING 360 tablet 2  . sulfamethoxazole-trimethoprim (BACTRIM DS,SEPTRA DS) 800-160 MG tablet TK 1 T PO  BID  0  . traZODone (DESYREL) 50 MG tablet Take 50 mg by mouth at bedtime.    Marland Kitchen albuterol (PROVENTIL HFA;VENTOLIN HFA) 108 (90 Base) MCG/ACT inhaler Inhale 2 puffs into the lungs every 6 (six) hours as needed for wheezing or shortness of breath. (Patient not taking: Reported on 09/19/2017) 1 Inhaler 2  . HYDROcodone-acetaminophen (NORCO/VICODIN) 5-325 MG tablet Take 1 tablet by mouth every 4 (four) hours as needed for moderate pain. (Patient not taking: Reported on 09/19/2017) 10 tablet 0  . ondansetron (ZOFRAN) 8 MG tablet Take 1 tablet (8 mg total) by mouth 2 (two) times daily as needed. Start on the third day after chemotherapy. (Patient not taking: Reported on 09/19/2017) 30 tablet 2  . valACYclovir (VALTREX) 1000 MG tablet Take 1 tablet (1,000 mg total) by mouth 2 (two) times daily. 14 tablet 0   No current facility-administered medications for this visit.     OBJECTIVE: Vitals:   09/19/17 0944  BP: 128/73  Pulse: 81  Resp: 18  Temp: (!) 96.9 F (36.1 C)     Body mass index is 34.33 kg/m.    ECOG FS:0 - Asymptomatic  General: Well-developed, well-nourished, no acute distress. Eyes: Pink conjunctiva, anicteric sclera. HEENT: Normocephalic, moist mucous membranes. Breast: Exam deferred today. Lungs: Clear to auscultation bilaterally. Heart: Regular rate and rhythm. No rubs, murmurs, or gallops. Abdomen: Soft, nontender, nondistended. No organomegaly noted, normoactive bowel sounds. Musculoskeletal: No edema, cyanosis, or clubbing. Neuro: Alert, answering all questions appropriately. Cranial nerves grossly intact. Skin: No rashes or petechiae noted. Psych: Normal affect.  LAB RESULTS:  Lab Results  Component Value Date   NA 136  09/19/2017   K 3.4 (L) 09/19/2017   CL 108 09/19/2017   CO2 19 (L) 09/19/2017   GLUCOSE 123 (H) 09/19/2017   BUN 15 09/19/2017   CREATININE 1.13 (H) 09/19/2017   CALCIUM 9.1 09/19/2017   PROT 6.8 09/19/2017   ALBUMIN 3.7 09/19/2017   AST 43 (H) 09/19/2017   ALT 40 09/19/2017   ALKPHOS 112 09/19/2017   BILITOT 0.5 09/19/2017   GFRNONAA 49 (L) 09/19/2017   GFRAA 57 (L) 09/19/2017    Lab Results  Component Value Date   WBC 7.0 09/19/2017   NEUTROABS 5.3 09/19/2017   HGB 10.1 (L) 09/19/2017   HCT 29.3 (L) 09/19/2017   MCV 86.0 09/19/2017   PLT 243 09/19/2017     STUDIES: Dg Chest 1 View  Result Date: 09/14/2017 CLINICAL DATA:  67 year old female with history of fever. History of cancer. EXAM: CHEST  1 VIEW COMPARISON:  Chest x-ray 08/07/2017. FINDINGS: Right subclavian single-lumen porta cath with tip terminating in the superior cavoatrial junction. Mild diffuse peribronchial cuffing. Lung volumes are low. No consolidative airspace disease. No pleural effusions. No pneumothorax. No pulmonary nodule or mass noted. Pulmonary vasculature and the cardiomediastinal silhouette are within normal limits. Aortic atherosclerosis. IMPRESSION: 1. Mild diffuse peribronchial cuffing, concerning for acute bronchitis. 2. Aortic atherosclerosis. Electronically Signed   By: Vinnie Langton M.D.   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  PLAN:    1. 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  Case was also discussed extensively at case conference.  Given the size and the stage of patient's malignancy, she will benefit from neoadjuvant chemotherapy using Adriamycin, Cytoxan, and Taxol.  Patient will also require Udenyca support.  Patient will likely require adjuvant XRT  followed by an aromatase inhibitor for 5 years at the conclusion of her treatments.  CT scan of the chest, abdomen, pelvis completed on August 01, 2017 did not reveal any metastatic disease.  MUGA scan on August 02, 2017 revealed EF of 61% which is adequate to proceed with treatment.  Proceed with cycle 4 of4 of dose dense Adriamycin and Cytoxan today.  Return to clinic tomorrow for [facility].  Patient will then return to clinic in 2 weeks for further evaluation and consideration of cycle 1 of 12 of weekly Taxol. 2.  UTI: Continue Bactrim as prescribed. 3.  Anemia: Patient's hemoglobin has trended down to 10.1, monitor. 4.  Renal insufficiency: Patient's creatinine slightly increased to 1.13.  Encourage increased fluid intake. 5.  Cold sores: Patient was given a prescription for Valtrex.   Patient expressed understanding and was in agreement with this plan. She also understands that She can call clinic at any time with any questions, concerns, or complaints.   Cancer Staging Breast cancer, stage 2, left ([place]) Staging form: Breast, AJCC 8th Edition - Clinical stage from 07/30/2017: Stage IIA (cT2, cN1, cM0, G2, ER+, PR+, HER2-) - Signed by Lloyd Huger, MD on 07/30/2017   Lloyd Huger, MD   09/21/2017 7:17 AM

## 2017-09-17 LAB — URINE CULTURE: Culture: 40000 — AB

## 2017-09-19 ENCOUNTER — Inpatient Hospital Stay (HOSPITAL_BASED_OUTPATIENT_CLINIC_OR_DEPARTMENT_OTHER): Payer: Medicare Other | Admitting: Oncology

## 2017-09-19 ENCOUNTER — Ambulatory Visit: Payer: Medicare Other

## 2017-09-19 ENCOUNTER — Other Ambulatory Visit: Payer: Medicare Other

## 2017-09-19 ENCOUNTER — Ambulatory Visit: Payer: Medicare Other | Admitting: Oncology

## 2017-09-19 ENCOUNTER — Inpatient Hospital Stay: Payer: Medicare Other | Attending: Oncology

## 2017-09-19 ENCOUNTER — Other Ambulatory Visit: Payer: Self-pay

## 2017-09-19 ENCOUNTER — Inpatient Hospital Stay: Payer: Medicare Other

## 2017-09-19 VITALS — BP 128/73 | HR 81 | Temp 96.9°F | Resp 18 | Wt 212.7 lb

## 2017-09-19 DIAGNOSIS — Z170 Estrogen receptor positive status [ER+]: Secondary | ICD-10-CM | POA: Diagnosis not present

## 2017-09-19 DIAGNOSIS — F17210 Nicotine dependence, cigarettes, uncomplicated: Secondary | ICD-10-CM | POA: Diagnosis not present

## 2017-09-19 DIAGNOSIS — C50412 Malignant neoplasm of upper-outer quadrant of left female breast: Secondary | ICD-10-CM

## 2017-09-19 DIAGNOSIS — I10 Essential (primary) hypertension: Secondary | ICD-10-CM | POA: Diagnosis not present

## 2017-09-19 DIAGNOSIS — R5383 Other fatigue: Secondary | ICD-10-CM | POA: Insufficient documentation

## 2017-09-19 DIAGNOSIS — B001 Herpesviral vesicular dermatitis: Secondary | ICD-10-CM | POA: Diagnosis not present

## 2017-09-19 DIAGNOSIS — Z5111 Encounter for antineoplastic chemotherapy: Secondary | ICD-10-CM | POA: Insufficient documentation

## 2017-09-19 DIAGNOSIS — E871 Hypo-osmolality and hyponatremia: Secondary | ICD-10-CM | POA: Diagnosis not present

## 2017-09-19 DIAGNOSIS — N390 Urinary tract infection, site not specified: Secondary | ICD-10-CM

## 2017-09-19 DIAGNOSIS — C50912 Malignant neoplasm of unspecified site of left female breast: Secondary | ICD-10-CM

## 2017-09-19 DIAGNOSIS — N289 Disorder of kidney and ureter, unspecified: Secondary | ICD-10-CM

## 2017-09-19 DIAGNOSIS — Z5189 Encounter for other specified aftercare: Secondary | ICD-10-CM | POA: Diagnosis not present

## 2017-09-19 DIAGNOSIS — R531 Weakness: Secondary | ICD-10-CM | POA: Diagnosis not present

## 2017-09-19 DIAGNOSIS — D649 Anemia, unspecified: Secondary | ICD-10-CM | POA: Diagnosis not present

## 2017-09-19 LAB — CBC WITH DIFFERENTIAL/PLATELET
Basophils Absolute: 0 10*3/uL (ref 0–0.1)
Basophils Relative: 1 %
Eosinophils Absolute: 0 10*3/uL (ref 0–0.7)
Eosinophils Relative: 1 %
HCT: 29.3 % — ABNORMAL LOW (ref 35.0–47.0)
Hemoglobin: 10.1 g/dL — ABNORMAL LOW (ref 12.0–16.0)
Lymphocytes Relative: 7 %
Lymphs Abs: 0.5 10*3/uL — ABNORMAL LOW (ref 1.0–3.6)
MCH: 29.7 pg (ref 26.0–34.0)
MCHC: 34.5 g/dL (ref 32.0–36.0)
MCV: 86 fL (ref 80.0–100.0)
Monocytes Absolute: 1.1 10*3/uL — ABNORMAL HIGH (ref 0.2–0.9)
Monocytes Relative: 16 %
Neutro Abs: 5.3 10*3/uL (ref 1.4–6.5)
Neutrophils Relative %: 75 %
Platelets: 243 10*3/uL (ref 150–440)
RBC: 3.4 MIL/uL — ABNORMAL LOW (ref 3.80–5.20)
RDW: 14.8 % — ABNORMAL HIGH (ref 11.5–14.5)
WBC: 7 10*3/uL (ref 3.6–11.0)

## 2017-09-19 LAB — COMPREHENSIVE METABOLIC PANEL
ALT: 40 U/L (ref 0–44)
AST: 43 U/L — ABNORMAL HIGH (ref 15–41)
Albumin: 3.7 g/dL (ref 3.5–5.0)
Alkaline Phosphatase: 112 U/L (ref 38–126)
Anion gap: 9 (ref 5–15)
BUN: 15 mg/dL (ref 8–23)
CO2: 19 mmol/L — ABNORMAL LOW (ref 22–32)
Calcium: 9.1 mg/dL (ref 8.9–10.3)
Chloride: 108 mmol/L (ref 98–111)
Creatinine, Ser: 1.13 mg/dL — ABNORMAL HIGH (ref 0.44–1.00)
GFR calc Af Amer: 57 mL/min — ABNORMAL LOW (ref >60)
GFR calc non Af Amer: 49 mL/min — ABNORMAL LOW (ref >60)
Glucose, Bld: 123 mg/dL — ABNORMAL HIGH (ref 70–99)
Potassium: 3.4 mmol/L — ABNORMAL LOW (ref 3.5–5.1)
Sodium: 136 mmol/L (ref 135–145)
Total Bilirubin: 0.5 mg/dL (ref 0.3–1.2)
Total Protein: 6.8 g/dL (ref 6.5–8.1)

## 2017-09-19 LAB — CULTURE, BLOOD (ROUTINE X 2)
Culture: NO GROWTH
Culture: NO GROWTH
Special Requests: ADEQUATE

## 2017-09-19 MED ORDER — PALONOSETRON HCL INJECTION 0.25 MG/5ML
0.2500 mg | Freq: Once | INTRAVENOUS | Status: AC
  Administered 2017-09-19: 0.25 mg via INTRAVENOUS
  Filled 2017-09-19: qty 5

## 2017-09-19 MED ORDER — SODIUM CHLORIDE 0.9% FLUSH
10.0000 mL | INTRAVENOUS | Status: DC | PRN
  Administered 2017-09-19: 10 mL
  Filled 2017-09-19: qty 10

## 2017-09-19 MED ORDER — HEPARIN SOD (PORK) LOCK FLUSH 100 UNIT/ML IV SOLN
INTRAVENOUS | Status: AC
  Filled 2017-09-19: qty 5

## 2017-09-19 MED ORDER — SODIUM CHLORIDE 0.9 % IV SOLN
600.0000 mg/m2 | Freq: Once | INTRAVENOUS | Status: AC
  Administered 2017-09-19: 1300 mg via INTRAVENOUS
  Filled 2017-09-19: qty 50

## 2017-09-19 MED ORDER — DOXORUBICIN HCL CHEMO IV INJECTION 2 MG/ML
60.0000 mg/m2 | Freq: Once | INTRAVENOUS | Status: AC
  Administered 2017-09-19: 130 mg via INTRAVENOUS
  Filled 2017-09-19: qty 65

## 2017-09-19 MED ORDER — HEPARIN SOD (PORK) LOCK FLUSH 100 UNIT/ML IV SOLN
500.0000 [IU] | Freq: Once | INTRAVENOUS | Status: AC | PRN
  Administered 2017-09-19: 500 [IU]
  Filled 2017-09-19: qty 5

## 2017-09-19 MED ORDER — VALACYCLOVIR HCL 1 G PO TABS: 1000.0000 mg | ORAL_TABLET | Freq: Two times a day (BID) | ORAL | 0 refills | Status: DC

## 2017-09-19 MED ORDER — SODIUM CHLORIDE 0.9 % IV SOLN
Freq: Once | INTRAVENOUS | Status: AC
  Administered 2017-09-19: 10:00:00 via INTRAVENOUS
  Filled 2017-09-19: qty 250

## 2017-09-19 MED ORDER — SODIUM CHLORIDE 0.9 % IV SOLN
Freq: Once | INTRAVENOUS | Status: AC
  Administered 2017-09-19: 11:00:00 via INTRAVENOUS
  Filled 2017-09-19: qty 5

## 2017-09-19 NOTE — Progress Notes (Signed)
Per pt here for follow up. Stated she started to get sick aug 31- UTI dx- burning on urination and cloudy urine. Stated fever on Sat 8/31 temp 1010.1- stated she went to ER -got IVF -felt better.  Diarrhea started Sun Sept1/19-  Watery stools- x 2 days   Better w antidiarrhea meds- immodium she stated.  Stated no BM since Tuesday   Feeling better  Now.

## 2017-09-20 ENCOUNTER — Inpatient Hospital Stay: Payer: Medicare Other

## 2017-09-20 DIAGNOSIS — Z5111 Encounter for antineoplastic chemotherapy: Secondary | ICD-10-CM | POA: Diagnosis not present

## 2017-09-20 DIAGNOSIS — E871 Hypo-osmolality and hyponatremia: Secondary | ICD-10-CM | POA: Diagnosis not present

## 2017-09-20 DIAGNOSIS — C50912 Malignant neoplasm of unspecified site of left female breast: Secondary | ICD-10-CM

## 2017-09-20 DIAGNOSIS — R531 Weakness: Secondary | ICD-10-CM | POA: Diagnosis not present

## 2017-09-20 DIAGNOSIS — R5383 Other fatigue: Secondary | ICD-10-CM | POA: Diagnosis not present

## 2017-09-20 DIAGNOSIS — Z5189 Encounter for other specified aftercare: Secondary | ICD-10-CM | POA: Diagnosis not present

## 2017-09-20 DIAGNOSIS — C50412 Malignant neoplasm of upper-outer quadrant of left female breast: Secondary | ICD-10-CM | POA: Diagnosis not present

## 2017-09-20 MED ORDER — PEGFILGRASTIM-CBQV 6 MG/0.6ML ~~LOC~~ SOSY
6.0000 mg | PREFILLED_SYRINGE | Freq: Once | SUBCUTANEOUS | Status: AC
  Administered 2017-09-20: 6 mg via SUBCUTANEOUS
  Filled 2017-09-20: qty 0.6

## 2017-09-29 NOTE — Progress Notes (Signed)
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  INTERVAL HISTORY: Patient returns to clinic today for further evaluation and consideration of cycle 1 of 12 of weekly Taxol.  She currently feels well and is asymptomatic.  She does not complain of weakness or fatigue today.  She continues to be anxious.  She has no neurologic complaints. She has a good appetite and denies weight loss.  She has no chest pain or shortness of breath.  She denies any nausea, vomiting, constipation, or diarrhea.  She has no urinary complaints.  Patient offers no specific complaints today.    REVIEW OF SYSTEMS:   Review of Systems  Constitutional: Negative.  Negative for fever, malaise/fatigue and weight loss.  Respiratory: Negative.  Negative for cough and shortness of breath.   Cardiovascular: Negative.  Negative for chest pain and leg swelling.  Gastrointestinal: Negative.  Negative for abdominal pain and constipation.  Genitourinary: Negative.  Negative for dysuria.  Musculoskeletal: Negative.  Negative for back pain.  Skin: Negative.  Negative for rash.  Neurological: Negative.  Negative for focal weakness, weakness and headaches.  Psychiatric/Behavioral: The patient is nervous/anxious.     As per HPI. Otherwise, a complete review of systems is negative.  PAST MEDICAL HISTORY: Past Medical History:  Diagnosis Date  . Anxiety   . Depression   . Hypertension   . Hypothyroidism   . Rapid heart rate   . Thyroid disease     PAST SURGICAL HISTORY: Past Surgical History:  Procedure Laterality Date  . AXILLARY LYMPH NODE BIOPSY Left  07/16/2017   METASTATIC MAMMARY CARCINOMA  . BREAST BIOPSY Left 07/16/2017   [place] bx of left breast mass and left breast LN.  INVASIVE MAMMARY CARCINOMA, NO SPECIAL TYPE.   Marland Kitchen BREAST EXCISIONAL BIOPSY Right 2001  . BREAST LUMPECTOMY Right    2001  . COLONOSCOPY    . PORTACATH PLACEMENT Right 08/07/2017   Procedure: INSERTION PORT-A-CATH;  Surgeon: Robert Bellow, MD;  Location: [facility] ORS;  Service: General;  Laterality: Right;  . TONSILLECTOMY      FAMILY HISTORY: Family History  Problem Relation Age of Onset  . Stroke Mother   . Thyroid disease Mother   . Renal Disease Mother   . Stroke Father   . Heart attack Father   . Sudden death Father 56       suicide  . Anuerysm Brother   . Breast cancer Neg Hx     ADVANCED DIRECTIVES (Y/N):  N  HEALTH MAINTENANCE: Social History   Tobacco Use  . Smoking status: Current Some Day Smoker    Packs/day: 0.50    Years: 30.00    Pack years: 15.00    Types: Cigarettes  . Smokeless tobacco: Never Used  Substance Use Topics  . Alcohol use: Yes    Comment: occasional q 84mo  . Drug use: Never     Colonoscopy:  PAP:  Bone density:  Lipid panel:  No Known Allergies  Current Outpatient Medications  Medication Sig Dispense Refill  . ALPRAZolam (XANAX) 0.25 MG tablet Take 0.25 mg by mouth 2 (two) times daily.     .Marland KitchenamLODipine (NORVASC) 5 MG tablet  Take 5 mg by mouth daily.    Marland Kitchen aspirin EC 81 MG tablet Take 81 mg by mouth daily.    Marland Kitchen levothyroxine (SYNTHROID, LEVOTHROID) 100 MCG tablet Take 100 mcg by mouth daily before breakfast.    . lidocaine-prilocaine (EMLA) cream Apply to affected area once (Patient taking differently: Apply 1 application topically once. Used when port is accessed) 30 g 3  . LYSINE PO Take by mouth as needed.    . Potassium 99 MG TABS Take 99 mg by mouth daily.    . prochlorperazine (COMPAZINE) 10 MG tablet TAKE 1 TABLET(10 MG) BY MOUTH EVERY 6 HOURS AS NEEDED FOR NAUSEA OR VOMITING 360 tablet 2  . traZODone  (DESYREL) 50 MG tablet Take 50 mg by mouth at bedtime.    . valACYclovir (VALTREX) 1000 MG tablet Take 1 tablet (1,000 mg total) by mouth 2 (two) times daily. 14 tablet 0  . albuterol (PROVENTIL HFA;VENTOLIN HFA) 108 (90 Base) MCG/ACT inhaler Inhale 2 puffs into the lungs every 6 (six) hours as needed for wheezing or shortness of breath. (Patient not taking: Reported on 09/19/2017) 1 Inhaler 2  . HYDROcodone-acetaminophen (NORCO/VICODIN) 5-325 MG tablet Take 1 tablet by mouth every 4 (four) hours as needed for moderate pain. (Patient not taking: Reported on 09/19/2017) 10 tablet 0  . ondansetron (ZOFRAN) 8 MG tablet Take 1 tablet (8 mg total) by mouth 2 (two) times daily as needed. Start on the third day after chemotherapy. (Patient not taking: Reported on 09/19/2017) 30 tablet 2   No current facility-administered medications for this visit.    Facility-Administered Medications Ordered in Other Visits  Medication Dose Route Frequency Provider Last Rate Last Dose  . heparin lock flush 100 unit/mL  500 Units Intravenous Once Lloyd Huger, MD      . PACLitaxel (TAXOL) 174 mg in sodium chloride 0.9 % 250 mL chemo infusion (</= '80mg'$ /m2)  80 mg/m2 (Treatment Plan Recorded) Intravenous Once Lloyd Huger, MD 63 mL/hr at 10/03/17 1143 174 mg at 10/03/17 1143  . sodium chloride flush (NS) 0.9 % injection 10 mL  10 mL Intravenous PRN Lloyd Huger, MD   10 mL at 10/03/17 0857    OBJECTIVE: Vitals:   10/03/17 0927  BP: 115/70  Pulse: 89  Resp: 18  Temp: 97.6 F (36.4 C)     Body mass index is 33.89 kg/m.    ECOG FS:0 - Asymptomatic  General: Well-developed, well-nourished, no acute distress. Eyes: Pink conjunctiva, anicteric sclera. HEENT: Normocephalic, moist mucous membranes. Lungs: Clear to auscultation bilaterally. Heart: Regular rate and rhythm. No rubs, murmurs, or gallops. Abdomen: Soft, nontender, nondistended. No organomegaly noted, normoactive bowel  sounds. Musculoskeletal: No edema, cyanosis, or clubbing. Neuro: Alert, answering all questions appropriately. Cranial nerves grossly intact. Skin: No rashes or petechiae noted. Psych: Normal affect.  LAB RESULTS:  Lab Results  Component Value Date   NA 136 10/03/2017   K 3.9 10/03/2017   CL 102 10/03/2017   CO2 22 10/03/2017   GLUCOSE 117 (H) 10/03/2017   BUN 7 (L) 10/03/2017   CREATININE 0.92 10/03/2017   CALCIUM 8.9 10/03/2017   PROT 6.9 10/03/2017   ALBUMIN 3.8 10/03/2017   AST 21 10/03/2017   ALT 16 10/03/2017   ALKPHOS 96 10/03/2017   BILITOT 0.5 10/03/2017   GFRNONAA >60 10/03/2017   GFRAA >60 10/03/2017    Lab Results  Component Value Date   WBC 10.7 10/03/2017   NEUTROABS 8.7 (H) 10/03/2017   HGB 9.3 (  L) 10/03/2017   HCT 26.2 (L) 10/03/2017   MCV 87.8 10/03/2017   PLT 256 10/03/2017     STUDIES: Dg Chest 1 View  Result Date: 09/14/2017 CLINICAL DATA:  67 year old female with history of fever. History of cancer. EXAM: CHEST  1 VIEW COMPARISON:  Chest x-ray 08/07/2017. FINDINGS: Right subclavian single-lumen porta cath with tip terminating in the superior cavoatrial junction. Mild diffuse peribronchial cuffing. Lung volumes are low. No consolidative airspace disease. No pleural effusions. No pneumothorax. No pulmonary nodule or mass noted. Pulmonary vasculature and the cardiomediastinal silhouette are within normal limits. Aortic atherosclerosis. IMPRESSION: 1. Mild diffuse peribronchial cuffing, concerning for acute bronchitis. 2. Aortic atherosclerosis. Electronically Signed   By: Vinnie Langton M.D.   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  PLAN:    1. 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  Case was also discussed extensively  at case conference.  Given the size and the stage of patient's malignancy, she will benefit from neoadjuvant chemotherapy using Adriamycin, Cytoxan, and Taxol.  Patient will likely require adjuvant XRT followed by an aromatase inhibitor for 5 years at the conclusion of her treatments.  CT scan of the chest, abdomen, pelvis completed on August 01, 2017 did not reveal any metastatic disease.  MUGA scan on August 02, 2017 revealed EF of 61% which is adequate to proceed with treatment.  Patient is now completed Adriamycin and Cytoxan, therefore we will proceed with cycle 1 of 12 of weekly Taxol.  Return to clinic in 1 week for further evaluation and consideration of cycle 2.   2.  UTI: Resolved. 3.  Anemia: Patient's hemoglobin is slowly trending down and is now 9.3.  Monitor. 4.  Renal insufficiency: Resolved. 5.  Cold sores: Resolved.  Patient expressed understanding and was in agreement with this plan. She also understands that She can call clinic at any time with any questions, concerns, or complaints.   Cancer Staging Breast cancer, stage 2, left ([place]) Staging form: Breast, AJCC 8th Edition - Clinical stage from 07/30/2017: Stage IIA (cT2, cN1, cM0, G2, ER+, PR+, HER2-) - Signed by Lloyd Huger, MD on 07/30/2017   Lloyd Huger, MD   10/03/2017 12:00 PM

## 2017-10-03 ENCOUNTER — Inpatient Hospital Stay (HOSPITAL_BASED_OUTPATIENT_CLINIC_OR_DEPARTMENT_OTHER): Payer: Medicare Other | Admitting: Oncology

## 2017-10-03 ENCOUNTER — Inpatient Hospital Stay: Payer: Medicare Other

## 2017-10-03 ENCOUNTER — Encounter: Payer: Self-pay | Admitting: Oncology

## 2017-10-03 VITALS — BP 110/70 | HR 80 | Resp 18

## 2017-10-03 VITALS — BP 115/70 | HR 89 | Temp 97.6°F | Resp 18 | Wt 210.0 lb

## 2017-10-03 DIAGNOSIS — D649 Anemia, unspecified: Secondary | ICD-10-CM

## 2017-10-03 DIAGNOSIS — Z170 Estrogen receptor positive status [ER+]: Secondary | ICD-10-CM

## 2017-10-03 DIAGNOSIS — C50412 Malignant neoplasm of upper-outer quadrant of left female breast: Secondary | ICD-10-CM | POA: Diagnosis not present

## 2017-10-03 DIAGNOSIS — C50912 Malignant neoplasm of unspecified site of left female breast: Secondary | ICD-10-CM

## 2017-10-03 DIAGNOSIS — I10 Essential (primary) hypertension: Secondary | ICD-10-CM | POA: Diagnosis not present

## 2017-10-03 LAB — CBC WITH DIFFERENTIAL/PLATELET
Basophils Absolute: 0 10*3/uL (ref 0–0.1)
Basophils Relative: 1 %
Eosinophils Absolute: 0 10*3/uL (ref 0–0.7)
Eosinophils Relative: 0 %
HCT: 26.2 % — ABNORMAL LOW (ref 35.0–47.0)
Hemoglobin: 9.3 g/dL — ABNORMAL LOW (ref 12.0–16.0)
Lymphocytes Relative: 3 %
Lymphs Abs: 0.3 10*3/uL — ABNORMAL LOW (ref 1.0–3.6)
MCH: 31.1 pg (ref 26.0–34.0)
MCHC: 35.4 g/dL (ref 32.0–36.0)
MCV: 87.8 fL (ref 80.0–100.0)
Monocytes Absolute: 1.5 10*3/uL — ABNORMAL HIGH (ref 0.2–0.9)
Monocytes Relative: 15 %
Neutro Abs: 8.7 10*3/uL — ABNORMAL HIGH (ref 1.4–6.5)
Neutrophils Relative %: 81 %
Platelets: 256 10*3/uL (ref 150–440)
RBC: 2.98 MIL/uL — ABNORMAL LOW (ref 3.80–5.20)
RDW: 16.4 % — ABNORMAL HIGH (ref 11.5–14.5)
WBC: 10.7 10*3/uL (ref 3.6–11.0)

## 2017-10-03 LAB — COMPREHENSIVE METABOLIC PANEL
ALT: 16 U/L (ref 0–44)
AST: 21 U/L (ref 15–41)
Albumin: 3.8 g/dL (ref 3.5–5.0)
Alkaline Phosphatase: 96 U/L (ref 38–126)
Anion gap: 12 (ref 5–15)
BUN: 7 mg/dL — ABNORMAL LOW (ref 8–23)
CO2: 22 mmol/L (ref 22–32)
Calcium: 8.9 mg/dL (ref 8.9–10.3)
Chloride: 102 mmol/L (ref 98–111)
Creatinine, Ser: 0.92 mg/dL (ref 0.44–1.00)
GFR calc Af Amer: >60 mL/min (ref >60)
GFR calc non Af Amer: >60 mL/min (ref >60)
Glucose, Bld: 117 mg/dL — ABNORMAL HIGH (ref 70–99)
Potassium: 3.9 mmol/L (ref 3.5–5.1)
Sodium: 136 mmol/L (ref 135–145)
Total Bilirubin: 0.5 mg/dL (ref 0.3–1.2)
Total Protein: 6.9 g/dL (ref 6.5–8.1)

## 2017-10-03 MED ORDER — SODIUM CHLORIDE 0.9 % IV SOLN: 10.0000 mg | Freq: Once | INTRAVENOUS | Status: DC

## 2017-10-03 MED ORDER — SODIUM CHLORIDE 0.9 % IV SOLN
80.0000 mg/m2 | Freq: Once | INTRAVENOUS | Status: AC
  Administered 2017-10-03: 174 mg via INTRAVENOUS
  Filled 2017-10-03: qty 29

## 2017-10-03 MED ORDER — SODIUM CHLORIDE 0.9 % IV SOLN
Freq: Once | INTRAVENOUS | Status: AC
  Administered 2017-10-03: 10:00:00 via INTRAVENOUS
  Filled 2017-10-03: qty 250

## 2017-10-03 MED ORDER — SODIUM CHLORIDE 0.9% FLUSH
10.0000 mL | INTRAVENOUS | Status: DC | PRN
  Administered 2017-10-03: 10 mL via INTRAVENOUS
  Filled 2017-10-03: qty 10

## 2017-10-03 MED ORDER — DEXAMETHASONE SODIUM PHOSPHATE 10 MG/ML IJ SOLN
10.0000 mg | Freq: Once | INTRAMUSCULAR | Status: AC
  Administered 2017-10-03: 10 mg via INTRAVENOUS
  Filled 2017-10-03: qty 1

## 2017-10-03 MED ORDER — DIPHENHYDRAMINE HCL 50 MG/ML IJ SOLN
25.0000 mg | Freq: Once | INTRAMUSCULAR | Status: AC
  Administered 2017-10-03: 25 mg via INTRAVENOUS
  Filled 2017-10-03: qty 1

## 2017-10-03 MED ORDER — FAMOTIDINE IN NACL 20-0.9 MG/50ML-% IV SOLN
20.0000 mg | Freq: Once | INTRAVENOUS | Status: AC
  Administered 2017-10-03: 20 mg via INTRAVENOUS
  Filled 2017-10-03: qty 50

## 2017-10-03 MED ORDER — HEPARIN SOD (PORK) LOCK FLUSH 100 UNIT/ML IV SOLN
500.0000 [IU] | Freq: Once | INTRAVENOUS | Status: AC
  Administered 2017-10-03: 500 [IU] via INTRAVENOUS
  Filled 2017-10-03: qty 5

## 2017-10-03 NOTE — Progress Notes (Signed)
Pt in for follow up, reports having "blurry and watery eyes since last in clinic".

## 2017-10-04 ENCOUNTER — Ambulatory Visit: Payer: Medicare Other

## 2017-10-04 ENCOUNTER — Emergency Department: Payer: Medicare Other

## 2017-10-04 ENCOUNTER — Other Ambulatory Visit: Payer: Self-pay

## 2017-10-04 ENCOUNTER — Inpatient Hospital Stay
Admission: EM | Admit: 2017-10-04 | Discharge: 2017-10-07 | DRG: 872 | Disposition: A | Discharge status: Home / Self Care | Payer: Medicare Other | Attending: Internal Medicine | Admitting: Internal Medicine

## 2017-10-04 ENCOUNTER — Encounter: Payer: Self-pay | Admitting: Oncology

## 2017-10-04 ENCOUNTER — Telehealth: Payer: Self-pay | Admitting: *Deleted

## 2017-10-04 ENCOUNTER — Inpatient Hospital Stay (HOSPITAL_BASED_OUTPATIENT_CLINIC_OR_DEPARTMENT_OTHER): Payer: Medicare Other | Admitting: Oncology

## 2017-10-04 ENCOUNTER — Inpatient Hospital Stay: Payer: Medicare Other

## 2017-10-04 ENCOUNTER — Encounter: Payer: Self-pay | Admitting: Emergency Medicine

## 2017-10-04 ENCOUNTER — Other Ambulatory Visit: Payer: Self-pay | Admitting: *Deleted

## 2017-10-04 VITALS — BP 91/58 | HR 109 | Temp 99.0°F | Resp 22

## 2017-10-04 DIAGNOSIS — R509 Fever, unspecified: Secondary | ICD-10-CM

## 2017-10-04 DIAGNOSIS — N3000 Acute cystitis without hematuria: Secondary | ICD-10-CM

## 2017-10-04 DIAGNOSIS — C50912 Malignant neoplasm of unspecified site of left female breast: Secondary | ICD-10-CM | POA: Diagnosis present

## 2017-10-04 DIAGNOSIS — Z7982 Long term (current) use of aspirin: Secondary | ICD-10-CM

## 2017-10-04 DIAGNOSIS — I959 Hypotension, unspecified: Secondary | ICD-10-CM | POA: Diagnosis not present

## 2017-10-04 DIAGNOSIS — F17210 Nicotine dependence, cigarettes, uncomplicated: Secondary | ICD-10-CM | POA: Diagnosis present

## 2017-10-04 DIAGNOSIS — Z8349 Family history of other endocrine, nutritional and metabolic diseases: Secondary | ICD-10-CM

## 2017-10-04 DIAGNOSIS — D61818 Other pancytopenia: Secondary | ICD-10-CM | POA: Diagnosis not present

## 2017-10-04 DIAGNOSIS — C50412 Malignant neoplasm of upper-outer quadrant of left female breast: Secondary | ICD-10-CM | POA: Diagnosis not present

## 2017-10-04 DIAGNOSIS — Z823 Family history of stroke: Secondary | ICD-10-CM

## 2017-10-04 DIAGNOSIS — E039 Hypothyroidism, unspecified: Secondary | ICD-10-CM | POA: Diagnosis present

## 2017-10-04 DIAGNOSIS — D649 Anemia, unspecified: Secondary | ICD-10-CM | POA: Diagnosis not present

## 2017-10-04 DIAGNOSIS — F419 Anxiety disorder, unspecified: Secondary | ICD-10-CM | POA: Diagnosis present

## 2017-10-04 DIAGNOSIS — N390 Urinary tract infection, site not specified: Secondary | ICD-10-CM | POA: Diagnosis not present

## 2017-10-04 DIAGNOSIS — R740 Nonspecific elevation of levels of transaminase and lactic acid dehydrogenase [LDH]: Secondary | ICD-10-CM | POA: Diagnosis not present

## 2017-10-04 DIAGNOSIS — E871 Hypo-osmolality and hyponatremia: Secondary | ICD-10-CM

## 2017-10-04 DIAGNOSIS — Z170 Estrogen receptor positive status [ER+]: Secondary | ICD-10-CM

## 2017-10-04 DIAGNOSIS — T451X5A Adverse effect of antineoplastic and immunosuppressive drugs, initial encounter: Secondary | ICD-10-CM | POA: Diagnosis present

## 2017-10-04 DIAGNOSIS — Z8249 Family history of ischemic heart disease and other diseases of the circulatory system: Secondary | ICD-10-CM

## 2017-10-04 DIAGNOSIS — Z79890 Hormone replacement therapy (postmenopausal): Secondary | ICD-10-CM

## 2017-10-04 DIAGNOSIS — A419 Sepsis, unspecified organism: Secondary | ICD-10-CM | POA: Diagnosis not present

## 2017-10-04 DIAGNOSIS — E860 Dehydration: Secondary | ICD-10-CM

## 2017-10-04 DIAGNOSIS — I10 Essential (primary) hypertension: Secondary | ICD-10-CM | POA: Diagnosis present

## 2017-10-04 DIAGNOSIS — Z841 Family history of disorders of kidney and ureter: Secondary | ICD-10-CM

## 2017-10-04 DIAGNOSIS — Z95828 Presence of other vascular implants and grafts: Secondary | ICD-10-CM

## 2017-10-04 DIAGNOSIS — R05 Cough: Secondary | ICD-10-CM | POA: Diagnosis not present

## 2017-10-04 HISTORY — DX: Malignant (primary) neoplasm, unspecified: C80.1

## 2017-10-04 LAB — CBC WITH DIFFERENTIAL/PLATELET
Basophils Absolute: 0 10*3/uL (ref 0–0.1)
Basophils Relative: 0 %
Eosinophils Absolute: 0 10*3/uL (ref 0–0.7)
Eosinophils Relative: 0 %
HCT: 25 % — ABNORMAL LOW (ref 35.0–47.0)
Hemoglobin: 8.8 g/dL — ABNORMAL LOW (ref 12.0–16.0)
Lymphocytes Relative: 3 %
Lymphs Abs: 0.2 10*3/uL — ABNORMAL LOW (ref 1.0–3.6)
MCH: 30.8 pg (ref 26.0–34.0)
MCHC: 35.1 g/dL (ref 32.0–36.0)
MCV: 87.7 fL (ref 80.0–100.0)
Monocytes Absolute: 0.6 10*3/uL (ref 0.2–0.9)
Monocytes Relative: 7 %
Neutro Abs: 7.1 10*3/uL — ABNORMAL HIGH (ref 1.4–6.5)
Neutrophils Relative %: 90 %
Platelets: 263 10*3/uL (ref 150–440)
RBC: 2.85 MIL/uL — ABNORMAL LOW (ref 3.80–5.20)
RDW: 16.4 % — ABNORMAL HIGH (ref 11.5–14.5)
WBC: 7.9 10*3/uL (ref 3.6–11.0)

## 2017-10-04 LAB — COMPREHENSIVE METABOLIC PANEL
ALT: 19 U/L (ref 0–44)
AST: 26 U/L (ref 15–41)
Albumin: 3.7 g/dL (ref 3.5–5.0)
Alkaline Phosphatase: 91 U/L (ref 38–126)
Anion gap: 12 (ref 5–15)
BUN: 14 mg/dL (ref 8–23)
CO2: 19 mmol/L — ABNORMAL LOW (ref 22–32)
Calcium: 8.7 mg/dL — ABNORMAL LOW (ref 8.9–10.3)
Chloride: 102 mmol/L (ref 98–111)
Creatinine, Ser: 0.95 mg/dL (ref 0.44–1.00)
GFR calc Af Amer: >60 mL/min (ref >60)
GFR calc non Af Amer: >60 mL/min (ref >60)
Glucose, Bld: 110 mg/dL — ABNORMAL HIGH (ref 70–99)
Potassium: 3.4 mmol/L — ABNORMAL LOW (ref 3.5–5.1)
Sodium: 133 mmol/L — ABNORMAL LOW (ref 135–145)
Total Bilirubin: 0.6 mg/dL (ref 0.3–1.2)
Total Protein: 6.7 g/dL (ref 6.5–8.1)

## 2017-10-04 LAB — URINALYSIS, COMPLETE (UACMP) WITH MICROSCOPIC
Bilirubin Urine: NEGATIVE
Glucose, UA: NEGATIVE mg/dL
Ketones, ur: NEGATIVE mg/dL
Nitrite: NEGATIVE
Protein, ur: 100 mg/dL — AB
RBC / HPF: >50 RBC/hpf — ABNORMAL HIGH (ref 0–5)
Specific Gravity, Urine: 1.017 (ref 1.005–1.030)
WBC, UA: >50 WBC/hpf — ABNORMAL HIGH (ref 0–5)
pH: 5 (ref 5.0–8.0)

## 2017-10-04 LAB — LACTIC ACID, PLASMA: Lactic Acid, Venous: 2 mmol/L (ref 0.5–1.9)

## 2017-10-04 MED ORDER — SODIUM CHLORIDE 0.9 % IV SOLN: 2.0000 g | Freq: Once | INTRAVENOUS | Status: DC

## 2017-10-04 MED ORDER — PHENAZOPYRIDINE HCL 200 MG PO TABS: 200.0000 mg | ORAL_TABLET | Freq: Three times a day (TID) | ORAL | 0 refills | Status: DC | PRN

## 2017-10-04 MED ORDER — SODIUM CHLORIDE 0.9 % IV BOLUS
1000.0000 mL | Freq: Once | INTRAVENOUS | Status: AC
  Administered 2017-10-04: 1000 mL via INTRAVENOUS

## 2017-10-04 MED ORDER — CIPROFLOXACIN HCL 500 MG PO TABS: 500.0000 mg | ORAL_TABLET | Freq: Two times a day (BID) | ORAL | 0 refills | Status: DC

## 2017-10-04 MED ORDER — SODIUM CHLORIDE 0.9 % IV SOLN
Freq: Once | INTRAVENOUS | Status: AC
  Administered 2017-10-04: 16:00:00 via INTRAVENOUS
  Filled 2017-10-04: qty 250

## 2017-10-04 MED ORDER — SODIUM CHLORIDE 0.9 % IV SOLN
2.0000 g | Freq: Two times a day (BID) | INTRAVENOUS | Status: DC
  Administered 2017-10-05: 2 g via INTRAVENOUS
  Filled 2017-10-04 (×2): qty 2

## 2017-10-04 MED ORDER — HEPARIN SOD (PORK) LOCK FLUSH 100 UNIT/ML IV SOLN
500.0000 [IU] | Freq: Once | INTRAVENOUS | Status: AC
  Administered 2017-10-04: 500 [IU] via INTRAVENOUS

## 2017-10-04 MED ORDER — SODIUM CHLORIDE 0.9% FLUSH
10.0000 mL | INTRAVENOUS | Status: DC | PRN
  Administered 2017-10-04: 10 mL via INTRAVENOUS
  Filled 2017-10-04: qty 10

## 2017-10-04 NOTE — Progress Notes (Signed)
Pharmacy Antibiotic Note  Alicia [name] is a 67 y.o. female admitted on 10/04/2017 with UTI.  Pharmacy has been consulted for cefepime dosing.  Plan: Cefepime 2 grams q 12 hours ordered.  Height: 5\' 6"  (167.6 cm) Weight: 210 lb (95.3 kg) IBW/kg (Calculated) : 59.3  Temp (24hrs), Avg:99.3 F (37.4 C), Min:99 F (37.2 C), Max:99.5 F (37.5 C)  Recent Labs  Lab 10/03/17 0857 10/04/17 1540  WBC 10.7 7.9  CREATININE 0.92 0.95  LATICACIDVEN  --  2.0*    Estimated Creatinine Clearance: 65.9 mL/min (by C-G formula based on SCr of 0.95 mg/dL).    No Known Allergies  Antimicrobials this admission: Cefepime 9/20  >>    >>   Dose adjustments this admission:   Microbiology results: 9/20  BCx: pending 9/20 UCx: pending       9/20 UA: LE(+) NO2(-)  WBC >50 Thank you for allowing pharmacy to be a part of this patient's care.  Jailen Lung S 10/04/2017 11:20 PM

## 2017-10-04 NOTE — ED Triage Notes (Signed)
Patient wheelchair to triage with complaints of fever, sweats and hypotension that started today. Pt was seen at primary's NP today and dx with "possible start of UTI", pt took Ciprofloxacin 500 mg today and tylenol 1000 mg at 2130.  After her visit the pt reported fever and on call Dr directed pt to ED.   Pt started new chemo treatment yesterday and then recently finished a 4 x course of "red devil" for stage 2 left breast CA.  Pt appears sweating in triage

## 2017-10-04 NOTE — Progress Notes (Signed)
Symptom Management Consult note [facility]  [phone] [phone]  Patient Care Team: Alicia Athens, MD as PCP - General (Internal Medicine) Bary Castilla [place] Gleason, MD as Consulting Physician (General Surgery)   Name of the patient: Alicia [name]  [id number]  27-Mar-1950   Date of visit: 10/04/2017  Diagnosis: .Acute cystitis without hematuria  Dehydration  Breast cancer, stage 2, left [facility])  Chief Complaint: Fever  Current Treatment: Weekly Taxol  Oncology History: Patient was seen and evaluated by primary medical oncologist Dr. Grayland Ormond yesterday prior to cycle 1 of weekly Taxol.  She felt well and offers no complaints.  She recently completed 4 cycles Adriamycin and Cytoxan.  She is s/p cycle 1 weekly Taxol. Last given 10/03/17.   She was seen and evaluated in the emergency room on 09/15/2017 for fevers found to be from E. coli UTI.  She was given a prescription for Bactrim.  Fevers resolved. UTI resolved.   Oncology History   Patient is a 67 year old female who recently self palpated a mass on her left breast.  Subsequent imaging and biopsy revealed the above-stated breast cancer.  Case was also discussed extensively at case conference.  Given the size and the stage of patient's malignancy, she will benefit from neoadjuvant chemotherapy using Adriamycin, Cytoxan, and Taxol.  Patient will also require Neulasta support.  Will get CT scan of the chest, abdomen, and pelvis to assess for any metastatic disease.  Patient will also require port placement and MUGA prior to initiating treatment  CT abdomen/pelvis/chest did not reveal any suspicious lesions concerning for metastatic disease. (08/01/17)  Port-A-Cath placed on 08/07/2017.  Cycle 1 day 1 of AC was given on 08/08/17.       Breast cancer, stage 2, left ([place])   07/24/2017 Initial Diagnosis    Breast cancer, stage 2, left ([place])    07/30/2017 Cancer Staging    Staging form: Breast, AJCC  8th Edition - Clinical stage from 07/30/2017: Stage IIA (cT2, cN1, cM0, G2, ER+, PR+, HER2-) - Signed by Alicia Huger, MD on 07/30/2017    07/31/2017 -  Chemotherapy    The patient had DOXOrubicin (ADRIAMYCIN) chemo injection 130 mg, 60 mg/m2 = 130 mg, Intravenous,  Once, 4 of 4 cycles Administration: 130 mg (08/08/2017), 130 mg (08/22/2017), 130 mg (09/05/2017), 130 mg (09/19/2017) palonosetron (ALOXI) injection 0.25 mg, 0.25 mg, Intravenous,  Once, 4 of 4 cycles Administration: 0.25 mg (08/08/2017), 0.25 mg (08/22/2017), 0.25 mg (09/05/2017), 0.25 mg (09/19/2017) pegfilgrastim-cbqv (UDENYCA) injection 6 mg, 6 mg, Subcutaneous, Once, 4 of 4 cycles Administration: 6 mg (08/09/2017), 6 mg (08/23/2017), 6 mg (09/06/2017), 6 mg (09/20/2017) cyclophosphamide (CYTOXAN) 1,300 mg in sodium chloride 0.9 % 250 mL chemo infusion, 600 mg/m2 = 1,300 mg, Intravenous,  Once, 4 of 4 cycles Administration: 1,300 mg (08/08/2017), 1,300 mg (08/22/2017), 1,300 mg (09/05/2017), 1,300 mg (09/19/2017) PACLitaxel (TAXOL) 174 mg in sodium chloride 0.9 % 250 mL chemo infusion (</= 16m/m2), 80 mg/m2 = 174 mg, Intravenous,  Once, 1 of 12 cycles Administration: 174 mg (10/03/2017) fosaprepitant (EMEND) 150 mg, dexamethasone (DECADRON) 12 mg in sodium chloride 0.9 % 145 mL IVPB, , Intravenous,  Once, 4 of 4 cycles Administration:  (08/08/2017),  (08/22/2017),  (09/05/2017),  (09/19/2017)  for chemotherapy treatment.      Subjective Data:  Subjective:     Alicia RENNERis a 67y.o. female who presents for evaluation of fever. She has had the fever for 1 day. Symptoms have been gradually worsening. Symptoms  are described as fevers up to 101.1 degrees, and are worse no p articular time. Associated symptoms are chills, fatigue, poor appetite and urinary tract symptoms. Patient denies abdominal pain, nausea, otitis symptoms, URI symptoms and vomiting.  She has tried to alleviate the symptoms with acetaminophen with minimal relief. The patient has  cancer, immunocompromised state of recent chemotherapy.  The following portions of the patient's history were reviewed and updated as appropriate: allergies, current medications, past family history, past medical history, past social history, past surgical history and problem list.  Review of Systems A comprehensive review of systems was negative except for: Constitutional: positive for chills, fevers and pallor Cardiovascular: positive for fatigue and hypotension/soft BP Genitourinary: positive for cloudy urine, frequency and nocturia Integument/breast: positive for skin color change Musculoskeletal: positive for myalgias   Objective:    BP (!) 91/58 (BP Location: Right Arm, Patient Position: Sitting) Comment: standing bp 91/59  Pulse (!) 109 Comment: standing pulse 119  Temp 99 F (37.2 C) (Tympanic)   Resp (!) 22  General appearance: alert, fatigued and pale Lungs: clear to auscultation bilaterally Abdomen: soft, non-tender; bowel sounds normal; no masses,  no organomegaly Extremities: extremities normal, atraumatic, no cyanosis or edema Skin: Skin color, texture, turgor normal. No rashes or lesions or no edema Neurologic: Grossly normal   Assessment:    Fever is likely secondary to recurrent UTI. Recently treated with Bactrim.  She denies dysuria, frequency or hesitancy.   Plan:    Supportive care with appropriate antipyretics and fluids. Obtain labs per orders. Hydrographic surveyor. Antibiotics as per orders.    Give 1 liter nacl for hyponatremia. Sodium 133 and soft BP. BP improved to 132/79 with HR 72.  UA revealed cloudy appearing urine and small amount of blood. Given history of UTI and recent E-coli infection and fevers will start RX 500 mg BID for 10 days and pyridum for urinary discomfort.   She is not neutropenic. Slight elevation in Lactic acid level. 2 today. The presence of cancer and dehydration can cause an elevated lactic level.  Anemic.   Hemoglobin trending down and is 8.8 today.   Likely due to chemotherapy.  Given elevated lactic level, will have her return to clinic on Monday for repeat labs.   Patient is borderline needing IV antibiotics.  Patient would like to remain outpatient if possible.  Instructed her if she develops a fever unresolved with Tylenol, she needs to report directly to the emergency room for further evaluation.  Patient understands.  Greater than 50% was spent in counseling and coordination of care with this patient including but not limited to discussion of the relevant topics above (See A&P) including, but not limited to diagnosis and management of acute and chronic medical conditions.   Faythe Casa, NP 10/07/2017 9:07 AM

## 2017-10-04 NOTE — Telephone Encounter (Signed)
Patient called to report a temperature of a 101 post chemotherapy. She stated that she thought she might have a UTI.  Discussed symptoms with Dr. Gary Fleet nurse and Rulon Abide NP. Advised that patient should come to symptom management as soon as possible for assesment and possible treatment.

## 2017-10-04 NOTE — ED Provider Notes (Signed)
[facility] Emergency Department Provider Note  ____________________________________________   First MD Initiated Contact with Patient 10/04/17 2257     (approximate)  I have reviewed the triage vital signs and the nursing notes.   HISTORY  Chief Complaint Fever and Hypotension  HPI Alicia [name] is a 67 y.o. female with a history of hypertension, breast cancer on chemotherapy, last chemo yesterday, who was presented to the emergency department with fever x1 day.  She also says that she has been having difficulty urinating and urinating less frequency and her urine appears hazy.  She was diagnosed with UTI at her oncologist office earlier today and given Cipro.  She is taken 1 dose of Cipro but had a persistent fever this evening of 101.8 at home at approximate 9 PM.  She took Tylenol this time, called her oncologist and then reported to the emergency department.  Says that she also felt slightly lightheaded.  Similar issue with fever and admission in late August.  Patient also reporting nonproductive cough that started today as well.    Past Medical History:  Diagnosis Date  . Anxiety   . Cancer ([place]) 07/2017   left breast  . Depression   . Hypertension   . Hypothyroidism   . Rapid heart rate   . Thyroid disease     Patient Active Problem List   Diagnosis Date Noted  . Breast cancer, stage 2, left ([place]) 07/24/2017  . Elevated troponin 02/17/2015    Past Surgical History:  Procedure Laterality Date  . AXILLARY LYMPH NODE BIOPSY Left 07/16/2017   METASTATIC MAMMARY CARCINOMA  . BREAST BIOPSY Left 07/16/2017   [place] bx of left breast mass and left breast LN.  INVASIVE MAMMARY CARCINOMA, NO SPECIAL TYPE.   Marland Kitchen BREAST EXCISIONAL BIOPSY Right 2001  . BREAST LUMPECTOMY Right    2001  . COLONOSCOPY    . PORTACATH PLACEMENT Right 08/07/2017   Procedure: INSERTION PORT-A-CATH;  Surgeon: Robert Bellow, MD;  Location: [facility] ORS;  Service: General;   Laterality: Right;  . TONSILLECTOMY      Prior to Admission medications   Medication Sig Start Date End Date Taking? Authorizing Provider  acetaminophen (TYLENOL) 500 MG tablet Take 1,000 mg by mouth every 6 (six) hours as needed for fever.    [provider]  albuterol (PROVENTIL HFA;VENTOLIN HFA) 108 (90 Base) MCG/ACT inhaler Inhale 2 puffs into the lungs every 6 (six) hours as needed for wheezing or shortness of breath. Patient not taking: Reported on 09/19/2017 08/09/17   Jacquelin Hawking, NP  ALPRAZolam Duanne Moron) 0.25 MG tablet Take 0.25 mg by mouth 2 (two) times daily.     [provider]  amLODipine (NORVASC) 5 MG tablet Take 5 mg by mouth daily.    [provider]  aspirin EC 81 MG tablet Take 81 mg by mouth daily.    [provider]  ciprofloxacin (CIPRO) 500 MG tablet Take 1 tablet (500 mg total) by mouth 2 (two) times daily. 10/04/17   Jacquelin Hawking, NP  HYDROcodone-acetaminophen (NORCO/VICODIN) 5-325 MG tablet Take 1 tablet by mouth every 4 (four) hours as needed for moderate pain. 08/07/17 08/07/18  Robert Bellow, MD  levothyroxine (SYNTHROID, LEVOTHROID) 100 MCG tablet Take 100 mcg by mouth daily before breakfast.    [provider]  lidocaine-prilocaine (EMLA) cream Apply to affected area once Patient taking differently: Apply 1 application topically once. Used when port is accessed 07/31/17   Lloyd Huger,  MD  LYSINE PO Take by mouth as needed.    [provider]  ondansetron (ZOFRAN) 8 MG tablet Take 1 tablet (8 mg total) by mouth 2 (two) times daily as needed. Start on the third day after chemotherapy. 07/31/17   Lloyd Huger, MD  phenazopyridine (PYRIDIUM) 200 MG tablet Take 1 tablet (200 mg total) by mouth 3 (three) times daily as needed for pain. 10/04/17   Jacquelin Hawking, NP  Potassium 99 MG TABS Take 99 mg by mouth daily.    [provider]  prochlorperazine (COMPAZINE) 10 MG tablet TAKE 1  TABLET(10 MG) BY MOUTH EVERY 6 HOURS AS NEEDED FOR NAUSEA OR VOMITING 07/31/17   Lloyd Huger, MD  traZODone (DESYREL) 50 MG tablet Take 50 mg by mouth at bedtime.    [provider]  valACYclovir (VALTREX) 1000 MG tablet Take 1 tablet (1,000 mg total) by mouth 2 (two) times daily. 09/19/17   Lloyd Huger, MD    Allergies Patient has no known allergies.  Family History  Problem Relation Age of Onset  . Stroke Mother   . Thyroid disease Mother   . Renal Disease Mother   . Stroke Father   . Heart attack Father   . Sudden death Father 66       suicide  . Anuerysm Brother   . Breast cancer Neg Hx     Social History Social History   Tobacco Use  . Smoking status: Current Some Day Smoker    Packs/day: 0.50    Years: 30.00    Pack years: 15.00    Types: Cigarettes  . Smokeless tobacco: Never Used  Substance Use Topics  . Alcohol use: Yes    Comment: occasional q 59mo   . Drug use: Never    Review of Systems  Constitutional: Fever Eyes: No visual changes. ENT: No sore throat. Cardiovascular: Denies chest pain. Respiratory: As above Gastrointestinal: No abdominal pain.  No nausea, no vomiting.  No diarrhea.  No constipation. Genitourinary: As above Musculoskeletal: Negative for back pain. Skin: Negative for rash. Neurological: Negative for headaches, focal weakness or numbness.   ____________________________________________   PHYSICAL EXAM:  VITAL SIGNS: ED Triage Vitals  Enc Vitals Group     BP 10/04/17 2232 (!) 109/49     Pulse Rate 10/04/17 2232 92     Resp 10/04/17 2232 20     Temp 10/04/17 2232 99.5 F (37.5 C)     Temp Source 10/04/17 2232 Oral     SpO2 10/04/17 2232 97 %     Weight 10/04/17 2233 210 lb (95.3 kg)     Height 10/04/17 2233 5\' 6"  (1.676 m)     Head Circumference --      Peak Flow --      Pain Score 10/04/17 2233 0     Pain Loc --      Pain Edu? --      Excl. in [place]? --     Constitutional: Alert and oriented. Well  appearing and in no acute distress. Eyes: Conjunctivae are normal.  Head: Atraumatic. Nose: No congestion/rhinnorhea. Mouth/Throat: Mucous membranes are moist.  Neck: No stridor.   Cardiovascular: Normal rate, regular rhythm. Grossly normal heart sounds.  Respiratory: Normal respiratory effort.  No retractions. Lungs CTAB. Gastrointestinal: Soft and nontender. No distention.  Musculoskeletal: No lower extremity tenderness nor edema.  No joint effusions. Neurologic:  Normal speech and language. No gross focal neurologic deficits are appreciated. Skin:  Skin is  warm, dry and intact. No rash noted. Psychiatric: Mood and affect are normal. Speech and behavior are normal.  ____________________________________________   LABS (all labs ordered are listed, but only abnormal results are displayed)  Labs Reviewed  CULTURE, BLOOD (ROUTINE X 2)  CULTURE, BLOOD (ROUTINE X 2)  COMPREHENSIVE METABOLIC PANEL  CBC WITH DIFFERENTIAL/PLATELET  URINALYSIS, ROUTINE W REFLEX MICROSCOPIC  I-STAT CG4 LACTIC ACID, ED  I-STAT CG4 LACTIC ACID, ED   ____________________________________________  EKG   ____________________________________________  RADIOLOGY  Pending chest x-ray ____________________________________________   PROCEDURES  Procedure(s) performed:   .Critical Care Performed by: Orbie Pyo, MD Authorized by: Orbie Pyo, MD   Critical care provider statement:    Critical care time (minutes):  45   Critical care was necessary to treat or prevent imminent or life-threatening deterioration of the following conditions:  Sepsis   Critical care was time spent personally by me on the following activities:  Discussions with consultants, evaluation of patient's response to treatment, examination of patient, ordering and performing treatments and interventions, ordering and review of laboratory studies, ordering and review of radiographic studies, pulse oximetry,  re-evaluation of patient's condition, obtaining history from patient or surrogate and review of old charts    Critical Care performed:    ____________________________________________   INITIAL IMPRESSION / ASSESSMENT AND PLAN / ED COURSE  Pertinent labs & imaging results that were available during my care of the patient were reviewed by me and considered in my medical decision making (see chart for details).  DDX: UTI, sepsis, fever, neutropenia As part of my medical decision making, I reviewed the following data within the [place] NUMBEROutpatient visits and Notes from prior ED visits  ----------------------------------------- 11:34 PM on 10/04/2017 -----------------------------------------  Patient to receive antibiotics as well as fluids.  To be admitted to the hospital.  Signed out to Dr. Jannifer Franklin. ____________________________________________   FINAL CLINICAL IMPRESSION(S) / ED DIAGNOSES  Sepsis.  UTI.  NEW MEDICATIONS STARTED DURING THIS VISIT:  New Prescriptions   No medications on file     Note:  This document was prepared using Dragon voice recognition software and may include unintentional dictation errors.     Orbie Pyo, MD 10/04/17 [phone]

## 2017-10-04 NOTE — ED Notes (Signed)
Report given to [place], gown given to pt as requested, call bell at bedside

## 2017-10-04 NOTE — ED Notes (Signed)
Pt stated that she started chemo yesterday and then today she has been running a fever all day along with sweating and hypotension. Pt stated that she was told that she might have a UTI starting and she was placed on Cipro. Pt denies any urinary symptoms at this time. Pt called her physician and he informed her to come to the ED.

## 2017-10-05 ENCOUNTER — Other Ambulatory Visit: Payer: Self-pay

## 2017-10-05 DIAGNOSIS — Z7982 Long term (current) use of aspirin: Secondary | ICD-10-CM | POA: Diagnosis not present

## 2017-10-05 DIAGNOSIS — T451X5A Adverse effect of antineoplastic and immunosuppressive drugs, initial encounter: Secondary | ICD-10-CM | POA: Diagnosis present

## 2017-10-05 DIAGNOSIS — C50912 Malignant neoplasm of unspecified site of left female breast: Secondary | ICD-10-CM | POA: Diagnosis present

## 2017-10-05 DIAGNOSIS — Z8349 Family history of other endocrine, nutritional and metabolic diseases: Secondary | ICD-10-CM | POA: Diagnosis not present

## 2017-10-05 DIAGNOSIS — Z841 Family history of disorders of kidney and ureter: Secondary | ICD-10-CM | POA: Diagnosis not present

## 2017-10-05 DIAGNOSIS — N390 Urinary tract infection, site not specified: Secondary | ICD-10-CM | POA: Diagnosis not present

## 2017-10-05 DIAGNOSIS — D649 Anemia, unspecified: Secondary | ICD-10-CM | POA: Diagnosis not present

## 2017-10-05 DIAGNOSIS — R05 Cough: Secondary | ICD-10-CM | POA: Diagnosis not present

## 2017-10-05 DIAGNOSIS — Z170 Estrogen receptor positive status [ER+]: Secondary | ICD-10-CM | POA: Diagnosis not present

## 2017-10-05 DIAGNOSIS — F17210 Nicotine dependence, cigarettes, uncomplicated: Secondary | ICD-10-CM | POA: Diagnosis not present

## 2017-10-05 DIAGNOSIS — F419 Anxiety disorder, unspecified: Secondary | ICD-10-CM | POA: Diagnosis present

## 2017-10-05 DIAGNOSIS — Z823 Family history of stroke: Secondary | ICD-10-CM | POA: Diagnosis not present

## 2017-10-05 DIAGNOSIS — Z79899 Other long term (current) drug therapy: Secondary | ICD-10-CM | POA: Diagnosis not present

## 2017-10-05 DIAGNOSIS — Z8249 Family history of ischemic heart disease and other diseases of the circulatory system: Secondary | ICD-10-CM | POA: Diagnosis not present

## 2017-10-05 DIAGNOSIS — Z79890 Hormone replacement therapy (postmenopausal): Secondary | ICD-10-CM | POA: Diagnosis not present

## 2017-10-05 DIAGNOSIS — D61818 Other pancytopenia: Secondary | ICD-10-CM | POA: Diagnosis present

## 2017-10-05 DIAGNOSIS — A419 Sepsis, unspecified organism: Secondary | ICD-10-CM | POA: Diagnosis not present

## 2017-10-05 DIAGNOSIS — C50919 Malignant neoplasm of unspecified site of unspecified female breast: Secondary | ICD-10-CM | POA: Diagnosis not present

## 2017-10-05 DIAGNOSIS — I10 Essential (primary) hypertension: Secondary | ICD-10-CM | POA: Diagnosis not present

## 2017-10-05 DIAGNOSIS — E039 Hypothyroidism, unspecified: Secondary | ICD-10-CM | POA: Diagnosis not present

## 2017-10-05 LAB — LACTIC ACID, PLASMA: Lactic Acid, Venous: 1.4 mmol/L (ref 0.5–1.9)

## 2017-10-05 LAB — URINALYSIS, ROUTINE W REFLEX MICROSCOPIC
Bilirubin Urine: NEGATIVE
Glucose, UA: NEGATIVE mg/dL
Ketones, ur: NEGATIVE mg/dL
Nitrite: NEGATIVE
Protein, ur: NEGATIVE mg/dL
Specific Gravity, Urine: 1.001 — ABNORMAL LOW (ref 1.005–1.030)
pH: 6 (ref 5.0–8.0)

## 2017-10-05 LAB — CBC WITH DIFFERENTIAL/PLATELET
Band Neutrophils: 2 %
Basophils Absolute: 0 10*3/uL (ref 0–0.1)
Basophils Relative: 0 %
Blasts: 0 %
Eosinophils Absolute: 0 10*3/uL (ref 0–0.7)
Eosinophils Relative: 0 %
HCT: 23.1 % — ABNORMAL LOW (ref 35.0–47.0)
Hemoglobin: 8.4 g/dL — ABNORMAL LOW (ref 12.0–16.0)
Lymphocytes Relative: 2 %
Lymphs Abs: 0.1 10*3/uL — ABNORMAL LOW (ref 1.0–3.6)
MCH: 31.8 pg (ref 26.0–34.0)
MCHC: 36.3 g/dL — ABNORMAL HIGH (ref 32.0–36.0)
MCV: 87.6 fL (ref 80.0–100.0)
Metamyelocytes Relative: 5 %
Monocytes Absolute: 0.1 10*3/uL — ABNORMAL LOW (ref 0.2–0.9)
Monocytes Relative: 2 %
Myelocytes: 0 %
Neutro Abs: 5.8 10*3/uL (ref 1.4–6.5)
Neutrophils Relative %: 89 %
Other: 0 %
Platelets: 242 10*3/uL (ref 150–440)
Promyelocytes Relative: 0 %
RBC: 2.63 MIL/uL — ABNORMAL LOW (ref 3.80–5.20)
RDW: 17.4 % — ABNORMAL HIGH (ref 11.5–14.5)
WBC: 6 10*3/uL (ref 3.6–11.0)
nRBC: 1 /100 WBC — ABNORMAL HIGH

## 2017-10-05 LAB — COMPREHENSIVE METABOLIC PANEL
ALT: 24 U/L (ref 0–44)
AST: 33 U/L (ref 15–41)
Albumin: 3.5 g/dL (ref 3.5–5.0)
Alkaline Phosphatase: 93 U/L (ref 38–126)
Anion gap: 8 (ref 5–15)
BUN: 12 mg/dL (ref 8–23)
CO2: 22 mmol/L (ref 22–32)
Calcium: 8.1 mg/dL — ABNORMAL LOW (ref 8.9–10.3)
Chloride: 107 mmol/L (ref 98–111)
Creatinine, Ser: 0.9 mg/dL (ref 0.44–1.00)
GFR calc Af Amer: >60 mL/min (ref >60)
GFR calc non Af Amer: >60 mL/min (ref >60)
Glucose, Bld: 128 mg/dL — ABNORMAL HIGH (ref 70–99)
Potassium: 3.3 mmol/L — ABNORMAL LOW (ref 3.5–5.1)
Sodium: 137 mmol/L (ref 135–145)
Total Bilirubin: 0.6 mg/dL (ref 0.3–1.2)
Total Protein: 6.2 g/dL — ABNORMAL LOW (ref 6.5–8.1)

## 2017-10-05 LAB — TSH: TSH: 1.065 u[IU]/mL (ref 0.350–4.500)

## 2017-10-05 MED ORDER — VALACYCLOVIR HCL 500 MG PO TABS
1000.0000 mg | ORAL_TABLET | Freq: Two times a day (BID) | ORAL | Status: DC
  Administered 2017-10-05 – 2017-10-07 (×5): 1000 mg via ORAL
  Filled 2017-10-05 (×6): qty 2

## 2017-10-05 MED ORDER — LEVOTHYROXINE SODIUM 100 MCG PO TABS
100.0000 ug | ORAL_TABLET | Freq: Every day | ORAL | Status: DC
  Administered 2017-10-05 – 2017-10-07 (×3): 100 ug via ORAL
  Filled 2017-10-05 (×3): qty 1

## 2017-10-05 MED ORDER — ACETAMINOPHEN 325 MG PO TABS
650.0000 mg | ORAL_TABLET | Freq: Four times a day (QID) | ORAL | Status: DC | PRN
  Administered 2017-10-06: 650 mg via ORAL
  Filled 2017-10-05: qty 2

## 2017-10-05 MED ORDER — DOCUSATE SODIUM 100 MG PO CAPS
100.0000 mg | ORAL_CAPSULE | Freq: Two times a day (BID) | ORAL | Status: DC
  Filled 2017-10-05 (×3): qty 1

## 2017-10-05 MED ORDER — TRAZODONE HCL 50 MG PO TABS
50.0000 mg | ORAL_TABLET | Freq: Every day | ORAL | Status: DC
  Administered 2017-10-05 – 2017-10-06 (×2): 50 mg via ORAL
  Filled 2017-10-05 (×2): qty 1

## 2017-10-05 MED ORDER — ONDANSETRON HCL 4 MG PO TABS: 4.0000 mg | ORAL_TABLET | Freq: Four times a day (QID) | ORAL | Status: DC | PRN

## 2017-10-05 MED ORDER — ACETAMINOPHEN 650 MG RE SUPP: 650.0000 mg | Freq: Four times a day (QID) | RECTAL | Status: DC | PRN

## 2017-10-05 MED ORDER — SODIUM CHLORIDE 0.9 % IV SOLN: 1.0000 g | INTRAVENOUS | Status: DC

## 2017-10-05 MED ORDER — ONDANSETRON HCL 4 MG/2ML IJ SOLN: 4.0000 mg | Freq: Four times a day (QID) | INTRAMUSCULAR | Status: DC | PRN

## 2017-10-05 MED ORDER — POTASSIUM CHLORIDE CRYS ER 20 MEQ PO TBCR
40.0000 meq | EXTENDED_RELEASE_TABLET | Freq: Once | ORAL | Status: AC
  Administered 2017-10-05: 40 meq via ORAL
  Filled 2017-10-05: qty 2

## 2017-10-05 MED ORDER — ACETAMINOPHEN 500 MG PO TABS
ORAL_TABLET | ORAL | Status: AC
  Filled 2017-10-05: qty 2

## 2017-10-05 MED ORDER — AMLODIPINE BESYLATE 5 MG PO TABS
5.0000 mg | ORAL_TABLET | Freq: Every day | ORAL | Status: DC
  Administered 2017-10-05 – 2017-10-07 (×2): 5 mg via ORAL
  Filled 2017-10-05 (×3): qty 1

## 2017-10-05 MED ORDER — PHENAZOPYRIDINE HCL 200 MG PO TABS: 200.0000 mg | ORAL_TABLET | Freq: Three times a day (TID) | ORAL | Status: DC | PRN

## 2017-10-05 MED ORDER — ACETAMINOPHEN 500 MG PO TABS
1000.0000 mg | ORAL_TABLET | Freq: Once | ORAL | Status: AC
  Administered 2017-10-05: 1000 mg via ORAL

## 2017-10-05 MED ORDER — ALPRAZOLAM 0.25 MG PO TABS
0.2500 mg | ORAL_TABLET | Freq: Two times a day (BID) | ORAL | Status: DC
  Administered 2017-10-05 – 2017-10-07 (×5): 0.25 mg via ORAL
  Filled 2017-10-05 (×5): qty 1

## 2017-10-05 MED ORDER — ENOXAPARIN SODIUM 40 MG/0.4ML ~~LOC~~ SOLN
40.0000 mg | SUBCUTANEOUS | Status: DC
  Administered 2017-10-05 – 2017-10-06 (×2): 40 mg via SUBCUTANEOUS
  Filled 2017-10-05 (×2): qty 0.4

## 2017-10-05 MED ORDER — HYDROCODONE-ACETAMINOPHEN 5-325 MG PO TABS
1.0000 | ORAL_TABLET | ORAL | Status: DC | PRN
  Filled 2017-10-05: qty 1

## 2017-10-05 MED ORDER — LOPERAMIDE HCL 2 MG PO CAPS
2.0000 mg | ORAL_CAPSULE | Freq: Four times a day (QID) | ORAL | Status: DC | PRN
  Administered 2017-10-05: 19:00:00 2 mg via ORAL
  Filled 2017-10-05: qty 1

## 2017-10-05 MED ORDER — RISAQUAD PO CAPS
1.0000 | ORAL_CAPSULE | Freq: Every day | ORAL | Status: DC
  Administered 2017-10-05 – 2017-10-07 (×3): 1 via ORAL
  Filled 2017-10-05 (×3): qty 1

## 2017-10-05 MED ORDER — SODIUM CHLORIDE 0.9 % IV SOLN
1.0000 g | INTRAVENOUS | Status: DC
  Administered 2017-10-05 – 2017-10-06 (×2): 1 g via INTRAVENOUS
  Filled 2017-10-05 (×2): qty 1
  Filled 2017-10-05: qty 10

## 2017-10-05 MED ORDER — SODIUM CHLORIDE 0.9 % IV SOLN
INTRAVENOUS | Status: DC
  Administered 2017-10-05 – 2017-10-07 (×4): via INTRAVENOUS

## 2017-10-05 MED ORDER — ASPIRIN EC 81 MG PO TBEC
81.0000 mg | DELAYED_RELEASE_TABLET | Freq: Every day | ORAL | Status: DC
  Administered 2017-10-05 – 2017-10-07 (×3): 81 mg via ORAL
  Filled 2017-10-05 (×3): qty 1

## 2017-10-05 MED ORDER — PROCHLORPERAZINE MALEATE 10 MG PO TABS: 10.0000 mg | ORAL_TABLET | Freq: Four times a day (QID) | ORAL | Status: DC | PRN

## 2017-10-05 MED ORDER — ALBUTEROL SULFATE (2.5 MG/3ML) 0.083% IN NEBU: 2.5000 mg | INHALATION_SOLUTION | RESPIRATORY_TRACT | Status: DC | PRN

## 2017-10-05 NOTE — Progress Notes (Signed)
[place] [facility] [place] NAME: Alicia [name]    MR#:  [id number]  DATE OF BIRTH:  [birth year]  SUBJECTIVE:  CHIEF COMPLAINT:   Chief Complaint  Patient presents with  . Fever  . Hypotension   -Feeling significantly fatigued.  Complaints of fever and chills this morning  REVIEW OF SYSTEMS:  Review of Systems  Constitutional: Positive for chills, fever and malaise/fatigue.  HENT: Negative for congestion, ear discharge, hearing loss and nosebleeds.   Eyes: Negative for blurred vision and double vision.  Respiratory: Negative for cough, shortness of breath and wheezing.   Cardiovascular: Negative for chest pain and palpitations.  Gastrointestinal: Positive for diarrhea. Negative for abdominal pain, constipation, nausea and vomiting.  Genitourinary: Negative for dysuria.  Neurological: Negative for dizziness, seizures and headaches.    DRUG ALLERGIES:  No Known Allergies  VITALS:  Blood pressure 129/67, pulse 86, temperature 98.9 F (37.2 C), temperature source Oral, resp. rate 18, height '5\' 6"'$  (1.676 m), weight 94.8 kg, SpO2 100 %.  PHYSICAL EXAMINATION:  Physical Exam  GENERAL:  67 y.o.-year-old patient lying in the bed with no acute distress.  Warm to touch. EYES: Pupils equal, round, reactive to light and accommodation. No scleral icterus. Extraocular muscles intact.  HEENT: Head atraumatic, normocephalic. Oropharynx and nasopharynx clear.  NECK:  Supple, no jugular venous distention. No thyroid enlargement, no tenderness.  LUNGS: Normal breath sounds bilaterally, no wheezing, rales,rhonchi or crepitation. No use of accessory muscles of respiration.  CARDIOVASCULAR: S1, S2 normal. No murmurs, rubs, or gallops.  -Right chest Port-A-Cath ABDOMEN: Soft, nontender, nondistended. Bowel sounds present. No organomegaly or mass.  EXTREMITIES: No pedal edema, cyanosis, or clubbing.  NEUROLOGIC: Cranial nerves II through XII are intact.  Muscle strength 5/5 in all extremities. Sensation intact. Gait not checked.  PSYCHIATRIC: The patient is alert and oriented x 3.  SKIN: No obvious rash, lesion, or ulcer.    LABORATORY PANEL:   CBC Recent Labs  Lab 10/04/17 2326  WBC 6.0  HGB 8.4*  HCT 23.1*  PLT 242   ------------------------------------------------------------------------------------------------------------------  Chemistries  Recent Labs  Lab 10/04/17 2326  NA 137  K 3.3*  CL 107  CO2 22  GLUCOSE 128*  BUN 12  CREATININE 0.90  CALCIUM 8.1*  AST 33  ALT 24  ALKPHOS 93  BILITOT 0.6   ------------------------------------------------------------------------------------------------------------------  Cardiac Enzymes No results for input(s): TROPONINI in the last 168 hours. ------------------------------------------------------------------------------------------------------------------  RADIOLOGY:  Dg Chest 2 View  Result Date: 10/05/2017 CLINICAL DATA:  67 y/o F; cough, fever. Patient recently started chemotherapy for breast cancer. EXAM: CHEST - 2 VIEW COMPARISON:  09/14/2017 chest radiograph. FINDINGS: Stable normal cardiac silhouette given projection and technique. Aortic atherosclerosis with calcification. Right port catheter tip projects over lower SVC and is stable. No focal consolidation. No pleural effusion or pneumothorax. No acute osseous abnormality is evident. IMPRESSION: No acute pulmonary process identified. Electronically Signed   By: Kristine Garbe M.D.   On: 10/05/2017 00:22    EKG:   Orders placed or performed during the hospital encounter of 10/04/17  . ED EKG 12-Lead  . ED EKG 12-Lead    ASSESSMENT AND PLAN:   67 year old female with past medical history significant for hypertension, hypothyroidism, new diagnosis of stage IIa ER/PR positive HER-2 negative left breast cancer currently on chemotherapy presents to hospital secondary to fevers.  1.  Fevers-secondary  to urinary tract infection -Cultures are pending.  Chest x-ray is negative -Not neutropenic  at this time. -Continue Rocephin at this time.  It is causing diarrhea so added probiotics.  2.  Breast cancer-stage IIa, ER/PR positive, HER-2 negative invasive left breast cancer status post lumpectomy. -Following up with cancer center.  Completed Adriamycin and Cytoxan and currently just received cycle 1 of 12 weekly Taxol -Not neutropenic at this time.  Continue to monitor blood counts.  3.  Acute on chronic anemia-secondary to chemotherapy.  Continue to monitor closely.  No acute indication for blood transfusion at this time  4.  Hypertension-on Norvasc.  5.  DVT prophylaxis-Lovenox  Up and ambulatory at baseline   All the records are reviewed and case discussed with Care Management/Social Workerr. Management plans discussed with the patient, family and they are in agreement.  CODE STATUS: Full Code  TOTAL TIME TAKING CARE OF THIS PATIENT: 37 minutes.   POSSIBLE D/C IN 2 DAYS, DEPENDING ON CLINICAL CONDITION.   Gladstone Lighter M.D on 10/05/2017 at 1:13 PM  Between 7am to 6pm - Pager - [phone]  After 6pm go to [email or web address] - password [email or web address] [place] [facility]  Office  [phone]  CC: Primary care physician; [name], MD

## 2017-10-05 NOTE — ED Notes (Signed)
Pt is going to the floor at this time. 

## 2017-10-05 NOTE — H&P (Addendum)
Alicia [name] is an 67 y.o. female.   Chief Complaint: Fever HPI: The patient with past medical history of breast cancer currently undergoing chemotherapy, hypertension and hypothyroidism presents to the emergency department complaining of fever.  She was seen at oncology today and prescribed Cipro for a urinary tract infection, however by the time she arrived home her temperature was 101.8 F.  She was encouraged to come to the emergency department for evaluation where she was found to have tachypnea as well as elevated lactic acid.  Sepsis protocol was initiated and the emergency department team called the hospitalist service for admission.  Past Medical History:  Diagnosis Date  . Anxiety   . Cancer ([place]) 07/2017   left breast  . Depression   . Hypertension   . Hypothyroidism   . Rapid heart rate   . Thyroid disease     Past Surgical History:  Procedure Laterality Date  . AXILLARY LYMPH NODE BIOPSY Left 07/16/2017   METASTATIC MAMMARY CARCINOMA  . BREAST BIOPSY Left 07/16/2017   [place] bx of left breast mass and left breast LN.  INVASIVE MAMMARY CARCINOMA, NO SPECIAL TYPE.   Marland Kitchen BREAST EXCISIONAL BIOPSY Right 2001  . BREAST LUMPECTOMY Right    2001  . COLONOSCOPY    . PORTACATH PLACEMENT Right 08/07/2017   Procedure: INSERTION PORT-A-CATH;  Surgeon: Robert Bellow, MD;  Location: [facility] ORS;  Service: General;  Laterality: Right;  . TONSILLECTOMY      Family History  Problem Relation Age of Onset  . Stroke Mother   . Thyroid disease Mother   . Renal Disease Mother   . Stroke Father   . Heart attack Father   . Sudden death Father 43       suicide  . Anuerysm Brother   . Breast cancer Neg Hx    Social History:  reports that she has been smoking cigarettes. She has a 15.00 pack-year smoking history. She has never used smokeless tobacco. She reports that she drinks alcohol. She reports that she does not use drugs.  Allergies: No Known Allergies   (Not in a hospital  admission)  Results for orders placed or performed during the hospital encounter of 10/04/17 (from the past 48 hour(s))  Comprehensive metabolic panel     Status: Abnormal   Collection Time: 10/04/17 11:26 PM  Result Value Ref Range   Sodium 137 135 - 145 mmol/L   Potassium 3.3 (L) 3.5 - 5.1 mmol/L   Chloride 107 98 - 111 mmol/L   CO2 22 22 - 32 mmol/L   Glucose, Bld 128 (H) 70 - 99 mg/dL   BUN 12 8 - 23 mg/dL   Creatinine, Ser 0.90 0.44 - 1.00 mg/dL   Calcium 8.1 (L) 8.9 - 10.3 mg/dL   Total Protein 6.2 (L) 6.5 - 8.1 g/dL   Albumin 3.5 3.5 - 5.0 g/dL   AST 33 15 - 41 U/L   ALT 24 0 - 44 U/L   Alkaline Phosphatase 93 38 - 126 U/L   Total Bilirubin 0.6 0.3 - 1.2 mg/dL   GFR calc non Af Amer >60 >60 mL/min   GFR calc Af Amer >60 >60 mL/min    Comment: (NOTE) The eGFR has been calculated using the CKD EPI equation. This calculation has not been validated in all clinical situations. eGFR's persistently <60 mL/min signify possible Chronic Kidney Disease.    Anion gap 8 5 - 15    Comment: Performed at [facility], [address] [place], [place] [address]  CBC WITH DIFFERENTIAL     Status: Abnormal   Collection Time: 10/04/17 11:26 PM  Result Value Ref Range   WBC 6.0 3.6 - 11.0 K/uL   RBC 2.63 (L) 3.80 - 5.20 MIL/uL   Hemoglobin 8.4 (L) 12.0 - 16.0 g/dL   HCT 23.1 (L) 35.0 - 47.0 %   MCV 87.6 80.0 - 100.0 fL   MCH 31.8 26.0 - 34.0 pg   MCHC 36.3 (H) 32.0 - 36.0 g/dL   RDW 17.4 (H) 11.5 - 14.5 %   Platelets 242 150 - 440 K/uL   Neutrophils Relative % 89 %   Lymphocytes Relative 2 %   Monocytes Relative 2 %   Eosinophils Relative 0 %   Basophils Relative 0 %   Band Neutrophils 2 %   Metamyelocytes Relative 5 %   Myelocytes 0 %   Promyelocytes Relative 0 %   Blasts 0 %   nRBC 1 (H) 0 /100 WBC   Other 0 %   Neutro Abs 5.8 1.4 - 6.5 K/uL   Lymphs Abs 0.1 (L) 1.0 - 3.6 K/uL   Monocytes Absolute 0.1 (L) 0.2 - 0.9 K/uL   Eosinophils Absolute 0.0 0 - 0.7 K/uL    Basophils Absolute 0.0 0 - 0.1 K/uL   RBC Morphology MIXED RBC POPULATION     Comment: Performed at [facility], [address] [place], [place] [address]  Urinalysis, Routine w reflex microscopic     Status: Abnormal   Collection Time: 10/04/17 11:26 PM  Result Value Ref Range   Color, Urine STRAW (A) YELLOW   APPearance CLEAR (A) CLEAR   Specific Gravity, Urine 1.001 (L) 1.005 - 1.030   pH 6.0 5.0 - 8.0   Glucose, UA NEGATIVE NEGATIVE mg/dL   Hgb urine dipstick SMALL (A) NEGATIVE   Bilirubin Urine NEGATIVE NEGATIVE   Ketones, ur NEGATIVE NEGATIVE mg/dL   Protein, ur NEGATIVE NEGATIVE mg/dL   Nitrite NEGATIVE NEGATIVE   Leukocytes, UA TRACE (A) NEGATIVE   RBC / HPF 0-5 0 - 5 RBC/hpf   WBC, UA 6-10 0 - 5 WBC/hpf   Bacteria, UA RARE (A) NONE SEEN   Squamous Epithelial / LPF 0-5 0 - 5    Comment: Performed at [facility], [address] [place], [place] [address]  Lactic acid, plasma     Status: None   Collection Time: 10/04/17 11:26 PM  Result Value Ref Range   Lactic Acid, Venous 1.4 0.5 - 1.9 mmol/L    Comment: Performed at [facility], [address], [place], [place] [address]   Dg Chest 2 View  Result Date: 10/05/2017 CLINICAL DATA:  67 y/o F; cough, fever. Patient recently started chemotherapy for breast cancer. EXAM: CHEST - 2 VIEW COMPARISON:  09/14/2017 chest radiograph. FINDINGS: Stable normal cardiac silhouette given projection and technique. Aortic atherosclerosis with calcification. Right port catheter tip projects over lower SVC and is stable. No focal consolidation. No pleural effusion or pneumothorax. No acute osseous abnormality is evident. IMPRESSION: No acute pulmonary process identified. Electronically Signed   By: Kristine Garbe M.D.   On: 10/05/2017 00:22    Review of Systems  Constitutional: Positive for fever. Negative for chills.  HENT: Negative for sore throat and tinnitus.   Eyes: Negative for blurred vision  and redness.  Respiratory: Negative for cough and shortness of breath.   Cardiovascular: Negative for chest pain, palpitations, orthopnea and PND.  Gastrointestinal: Negative for abdominal  pain, diarrhea, nausea and vomiting.  Genitourinary: Negative for dysuria, frequency and urgency.  Musculoskeletal: Negative for joint pain and myalgias.  Skin: Negative for rash.       No lesions  Neurological: Negative for speech change, focal weakness and weakness.  Endo/Heme/Allergies: Does not bruise/bleed easily.       No temperature intolerance  Psychiatric/Behavioral: Negative for depression and suicidal ideas.    Blood pressure 138/63, pulse 92, temperature 98.7 F (37.1 C), temperature source Oral, resp. rate (!) 25, height _0  (1.676 m), weight 95.3 kg, SpO2 98 %. Physical Exam  Vitals reviewed. Constitutional: She is oriented to person, place, and time. She appears well-developed and well-nourished.  HENT:  Head: Normocephalic and atraumatic.  Mouth/Throat: Oropharynx is clear and moist.  Eyes: Pupils are equal, round, and reactive to light. Conjunctivae and EOM are normal. No scleral icterus.  Neck: Normal range of motion. Neck supple. No JVD present. No tracheal deviation present. No thyromegaly present.  Cardiovascular: Normal rate, regular rhythm and normal heart sounds. Exam reveals no gallop and no friction rub.  No murmur heard. Respiratory: Effort normal and breath sounds normal.  GI: Soft. Bowel sounds are normal. She exhibits no distension. There is no tenderness.  Genitourinary:  Genitourinary Comments: Deferred  Musculoskeletal: Normal range of motion. She exhibits no edema.  Lymphadenopathy:    She has no cervical adenopathy.  Neurological: She is alert and oriented to person, place, and time. No cranial nerve deficit. She exhibits normal muscle tone.  Skin: Skin is warm and dry. No rash noted. No erythema.  Psychiatric: She has a normal mood and affect. Her behavior is  normal. Judgment and thought content normal.     Assessment/Plan This is a 67 year old female admitted for sepsis. 1.  Sepsis: The patient meets criteria via fever, tachypnea and elevated lactic acid.  She has already received cefepime and vancomycin.  Her last urine culture was susceptible to ceftriaxone.  Thus I have discontinued cefepime in favor of this antibiotic.  She is hemodynamically stable.  Continue to fluid resuscitate per sepsis guidelines.  Follow blood cultures for growth and sensitivities. [place] is 5800.  2.  UTI: Present on admission.  Antibiotics as above 3.  Hypertension: Controlled; blood pressure stable.  Continue amlodipine. 4.  Hypothyroidism: Check TSH; continue Synthroid 5.  DVT prophylaxis: Lovenox 6.  GI prophylaxis: None The patient is a full code.  Time spent on admission orders and patient care approximately 45 minutes  Harrie Foreman, MD 10/05/2017, 4:34 AM

## 2017-10-05 NOTE — Progress Notes (Signed)
CODE SEPSIS - PHARMACY COMMUNICATION  **Broad Spectrum Antibiotics should be administered within 1 hour of Sepsis diagnosis**  Time Code Sepsis Called/Page Received: 0920 2313  Antibiotics Ordered: 4920 1007  Time of 1st antibiotic administration: 0920 0014  Additional action taken by pharmacy:   If necessary, Name of Provider/Nurse Contacted:     Eloise Harman ,PharmD Clinical Pharmacist  10/05/2017  12:32 AM

## 2017-10-05 NOTE — Plan of Care (Signed)

## 2017-10-06 LAB — BASIC METABOLIC PANEL
Anion gap: 5 (ref 5–15)
BUN: 7 mg/dL — ABNORMAL LOW (ref 8–23)
CO2: 23 mmol/L (ref 22–32)
Calcium: 7.9 mg/dL — ABNORMAL LOW (ref 8.9–10.3)
Chloride: 108 mmol/L (ref 98–111)
Creatinine, Ser: 0.71 mg/dL (ref 0.44–1.00)
GFR calc Af Amer: >60 mL/min (ref >60)
GFR calc non Af Amer: >60 mL/min (ref >60)
Glucose, Bld: 109 mg/dL — ABNORMAL HIGH (ref 70–99)
Potassium: 3.4 mmol/L — ABNORMAL LOW (ref 3.5–5.1)
Sodium: 136 mmol/L (ref 135–145)

## 2017-10-06 LAB — CBC
HCT: 21.8 % — ABNORMAL LOW (ref 35.0–47.0)
Hemoglobin: 7.9 g/dL — ABNORMAL LOW (ref 12.0–16.0)
MCH: 31.1 pg (ref 26.0–34.0)
MCHC: 36.1 g/dL — ABNORMAL HIGH (ref 32.0–36.0)
MCV: 86.2 fL (ref 80.0–100.0)
Platelets: 191 10*3/uL (ref 150–440)
RBC: 2.53 MIL/uL — ABNORMAL LOW (ref 3.80–5.20)
RDW: 16.8 % — ABNORMAL HIGH (ref 11.5–14.5)
WBC: 2.3 10*3/uL — ABNORMAL LOW (ref 3.6–11.0)

## 2017-10-06 LAB — URINE CULTURE: Culture: >=100000 — AB

## 2017-10-06 LAB — HEMOGLOBIN A1C
Hgb A1c MFr Bld: 5.9 % — ABNORMAL HIGH (ref 4.8–5.6)
Mean Plasma Glucose: 123 mg/dL

## 2017-10-06 MED ORDER — PREMIER PROTEIN SHAKE
11.0000 [oz_av] | Freq: Two times a day (BID) | ORAL | Status: DC
  Administered 2017-10-06 – 2017-10-07 (×3): 11 [oz_av] via ORAL

## 2017-10-06 MED ORDER — ALUM & MAG HYDROXIDE-SIMETH 200-200-20 MG/5ML PO SUSP
30.0000 mL | ORAL | Status: DC | PRN
  Administered 2017-10-06: 22:00:00 30 mL via ORAL
  Filled 2017-10-06: qty 30

## 2017-10-06 MED ORDER — POTASSIUM CHLORIDE CRYS ER 20 MEQ PO TBCR
40.0000 meq | EXTENDED_RELEASE_TABLET | Freq: Once | ORAL | Status: AC
  Administered 2017-10-06: 40 meq via ORAL
  Filled 2017-10-06: qty 2

## 2017-10-06 NOTE — Progress Notes (Signed)
Reports "just feel weak" with no diarrhea reported this shift thus far. Afebrile today. No diarrhea reported. Tolerating diet. IVF's and antibiotic IV continued.

## 2017-10-06 NOTE — Progress Notes (Signed)
Initial Nutrition Assessment  DOCUMENTATION CODES:   Obesity unspecified  INTERVENTION:  Recommend liberalizing diet to regular.  Provide Premier Protein po BID, each supplement provides 160 kcal and 30 grams of protein.  Encouraged intake of calorie- and protein-dense foods at meals to help meet calorie/protein needs and prevent further unintentional weight loss or loss of lean body mass.  NUTRITION DIAGNOSIS:   Increased nutrient needs related to catabolic illness, cancer and cancer related treatments as evidenced by estimated needs.  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   Met with patient and her son at bedside. She reports she has had a decreased appetite since her diagnosis over the summer. She endorses taste changes and occasional nausea/stomach cramps that affect intake. She lives alone and prepares meals herself. She has been eating 2 meals per day. She may have chicken tenders or roast with potatoes and carrots. She reports Italian-style foods are best tolerated right now, which she believes is due to the spices/herbs she can taste. Discussed that this is good as they are typically high in calories and patient is choosing a good source of protein at meals. She reports her appetite feels a little better today. She was eating lunch at time of RD assessment (spaghetti with ground beef and tomato sauce). She drinks Premier Protein 1-2 bottles per day at home.  Patient reports her UBW prior to diagnosis was 228 lbs (103.6 kg). Per  chart patient was 103.1 kg (226.9 lbs) on 08/09/2017. She is currently 96.1 kg (211.9 lbs). She has lost 7 kg (6.8% body weight) over the past 2 months, which is significant for time frame.  Medications reviewed and include: acidophilus 1 capsule daily, Xanax, Colace, levothyroxine, NS @ 75 mL/hr, ceftriaxone.  Labs reviewed: Potassium 3.4, BUN 7.  Patient does not meet criteria for malnutrition at this time but is at risk for malnutrition in setting of significant weight loss over the past 2 months.  NUTRITION - FOCUSED PHYSICAL EXAM:    Most Recent Value  Orbital Region  No depletion  Upper Arm Region  No depletion  Thoracic and Lumbar Region  No depletion  Buccal Region  No depletion  Temple Region  No depletion  Clavicle Bone Region  No depletion  Clavicle and Acromion Bone Region  No depletion  Scapular Bone Region  No depletion  Dorsal Hand  Mild depletion  Patellar Region  No depletion  Anterior Thigh Region  No depletion  Posterior Calf Region  No depletion  Edema (RD Assessment)  None  Hair  Reviewed  Eyes  Reviewed  Mouth  Reviewed  Skin  Reviewed  Nails  Reviewed     Diet Order:   Diet Order            Diet Heart Room service appropriate? Yes; Fluid consistency: Thin  Diet effective now              EDUCATION NEEDS:   Education needs have been addressed  Skin:  Skin Assessment: Reviewed RN Assessment  Last BM:  10/05/2017 - medium type 5  Height:   Ht Readings from Last 1 Encounters:  10/05/17 _0  (1.676 m)    Weight:   Wt Readings from Last 1 Encounters:  10/06/17 96.1 kg    Ideal Body Weight:  59.1 kg  BMI:  Body mass index is 34.2 kg/m.  Estimated Nutritional Needs:   Kcal:  8832-5498 (MSJ x 1.3-1.5)  Protein:  95-115 grams (1-1.2 grams/kg)  Fluid:  1.8-2.1 L/day (30-35 mL/kg IBW)  Willey Blade, MS, RD, LDN Office: [phone] Pager: [phone] After Hours/Weekend Pager: [phone]

## 2017-10-06 NOTE — Progress Notes (Signed)
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  INTERVAL HISTORY: Patient returns to clinic today for further evaluation, hospital follow-up, and consideration of cycle 2 of 12 of weekly Taxol.  She continues to have significant weakness and fatigue, but states this is improved since her discharge.  She continues to be anxious.  She has no neurologic complaints. She has a good appetite and denies weight loss.  She has no chest pain or shortness of breath.  She denies any nausea, vomiting, constipation, or diarrhea.  Her UTI symptoms have resolved.  Patient offers no further specific complaints today.  REVIEW OF SYSTEMS:   Review of Systems  Constitutional: Positive for malaise/fatigue. Negative for fever and weight loss.  Respiratory: Negative.  Negative for cough and shortness of breath.   Cardiovascular: Negative.  Negative for chest pain and leg swelling.  Gastrointestinal: Negative.  Negative for abdominal pain and constipation.  Genitourinary: Negative.  Negative for dysuria and urgency.  Musculoskeletal: Negative.  Negative for back pain.  Skin: Negative.  Negative for rash.  Neurological: Positive for weakness. Negative for dizziness, focal weakness and headaches.  Psychiatric/Behavioral: The patient is nervous/anxious.     As per HPI. Otherwise, a complete review of systems is negative.  PAST MEDICAL HISTORY: Past Medical History:  Diagnosis Date  . Anxiety   . Cancer ([place]) 07/2017   left breast  . Depression   . Hypertension   . Hypothyroidism   . Rapid heart rate   . Thyroid disease     PAST  SURGICAL HISTORY: Past Surgical History:  Procedure Laterality Date  . AXILLARY LYMPH NODE BIOPSY Left 07/16/2017   METASTATIC MAMMARY CARCINOMA  . BREAST BIOPSY Left 07/16/2017   [place] bx of left breast mass and left breast LN.  INVASIVE MAMMARY CARCINOMA, NO SPECIAL TYPE.   Marland Kitchen BREAST EXCISIONAL BIOPSY Right 2001  . BREAST LUMPECTOMY Right    2001  . COLONOSCOPY    . PORTACATH PLACEMENT Right 08/07/2017   Procedure: INSERTION PORT-A-CATH;  Surgeon: Robert Bellow, MD;  Location: [facility] ORS;  Service: General;  Laterality: Right;  . TONSILLECTOMY      FAMILY HISTORY: Family History  Problem Relation Age of Onset  . Stroke Mother   . Thyroid disease Mother   . Renal Disease Mother   . Stroke Father   . Heart attack Father   . Sudden death Father 59       suicide  . Anuerysm Brother   . Breast cancer Neg Hx     ADVANCED DIRECTIVES (Y/N):  N  HEALTH MAINTENANCE: Social History   Tobacco Use  . Smoking status: Current Some Day Smoker    Packs/day: 0.50    Years: 30.00    Pack years: 15.00    Types: Cigarettes  . Smokeless tobacco: Never Used  Substance Use Topics  . Alcohol use: Yes    Comment: occasional q 74mo  . Drug use: Never     Colonoscopy:  PAP:  Bone density:  Lipid panel:  No Known Allergies  Current Outpatient Medications  Medication Sig Dispense Refill  . acetaminophen (TYLENOL) 500 MG tablet Take 1,000 mg by mouth every 6 (  six) hours as needed for fever.    Marland Kitchen acidophilus (RISAQUAD) CAPS capsule Take 1 capsule by mouth daily. 30 capsule 0  . albuterol (PROVENTIL HFA;VENTOLIN HFA) 108 (90 Base) MCG/ACT inhaler Inhale 2 puffs into the lungs every 6 (six) hours as needed for wheezing or shortness of breath. 1 Inhaler 2  . ALPRAZolam (XANAX) 0.25 MG tablet Take 0.25 mg by mouth 2 (two) times daily.     Marland Kitchen amLODipine (NORVASC) 5 MG tablet Take 5 mg by mouth daily.    Marland Kitchen aspirin EC 81 MG tablet Take 81 mg by mouth daily.    . cephALEXin (KEFLEX) 500 MG  capsule Take 1 capsule (500 mg total) by mouth every 8 (eight) hours for 7 days. 21 capsule 0  . HYDROcodone-acetaminophen (NORCO/VICODIN) 5-325 MG tablet Take 1 tablet by mouth every 4 (four) hours as needed for moderate pain. 10 tablet 0  . levothyroxine (SYNTHROID, LEVOTHROID) 100 MCG tablet Take 100 mcg by mouth daily before breakfast.    . lidocaine-prilocaine (EMLA) cream Apply to affected area once (Patient taking differently: Apply 1 application topically once. Used when port is accessed) 30 g 3  . LYSINE PO Take 1 Dose by mouth as needed (fever blister).     . ondansetron (ZOFRAN) 8 MG tablet Take 1 tablet (8 mg total) by mouth 2 (two) times daily as needed. Start on the third day after chemotherapy. 30 tablet 2  . Potassium 99 MG TABS Take 99 mg by mouth daily.    . prochlorperazine (COMPAZINE) 10 MG tablet TAKE 1 TABLET(10 MG) BY MOUTH EVERY 6 HOURS AS NEEDED FOR NAUSEA OR VOMITING (Patient taking differently: Take 10 mg by mouth every 6 (six) hours as needed for nausea or vomiting. ) 360 tablet 2  . traZODone (DESYREL) 50 MG tablet Take 50 mg by mouth at bedtime.    . valACYclovir (VALTREX) 1000 MG tablet Take 1 tablet (1,000 mg total) by mouth 2 (two) times daily. 14 tablet 0   No current facility-administered medications for this visit.     OBJECTIVE: Vitals:   10/10/17 0934  BP: 117/74  Pulse: 78  Resp: 18  Temp: (!) 97 F (36.1 C)     Body mass index is 34.15 kg/m.    ECOG FS:0 - Asymptomatic  General: Well-developed, well-nourished, no acute distress. Eyes: Pink conjunctiva, anicteric sclera. HEENT: Normocephalic, moist mucous membranes. Lungs: Clear to auscultation bilaterally. Heart: Regular rate and rhythm. No rubs, murmurs, or gallops. Abdomen: Soft, nontender, nondistended. No organomegaly noted, normoactive bowel sounds. Musculoskeletal: No edema, cyanosis, or clubbing. Neuro: Alert, answering all questions appropriately. Cranial nerves grossly intact. Skin: No  rashes or petechiae noted. Psych: Normal affect.  LAB RESULTS:  Lab Results  Component Value Date   NA 139 10/10/2017   K 3.3 (L) 10/10/2017   CL 108 10/10/2017   CO2 24 10/10/2017   GLUCOSE 102 (H) 10/10/2017   BUN 10 10/10/2017   CREATININE 0.82 10/10/2017   CALCIUM 9.0 10/10/2017   PROT 6.2 (L) 10/10/2017   ALBUMIN 3.4 (L) 10/10/2017   AST 41 10/10/2017   ALT 53 (H) 10/10/2017   ALKPHOS 76 10/10/2017   BILITOT 0.6 10/10/2017   GFRNONAA >60 10/10/2017   GFRAA >60 10/10/2017    Lab Results  Component Value Date   WBC 6.3 10/10/2017   NEUTROABS 4.9 10/10/2017   HGB 8.0 (L) 10/10/2017   HCT 23.0 (L) 10/10/2017   MCV 88.4 10/10/2017   PLT 347 10/10/2017  STUDIES: Dg Chest 1 View  Result Date: 09/14/2017 CLINICAL DATA:  67 year old female with history of fever. History of cancer. EXAM: CHEST  1 VIEW COMPARISON:  Chest x-ray 08/07/2017. FINDINGS: Right subclavian single-lumen porta cath with tip terminating in the superior cavoatrial junction. Mild diffuse peribronchial cuffing. Lung volumes are low. No consolidative airspace disease. No pleural effusions. No pneumothorax. No pulmonary nodule or mass noted. Pulmonary vasculature and the cardiomediastinal silhouette are within normal limits. Aortic atherosclerosis. IMPRESSION: 1. Mild diffuse peribronchial cuffing, concerning for acute bronchitis. 2. Aortic atherosclerosis. Electronically Signed   By: Vinnie Langton M.D.   On: 09/14/2017 22:25   Dg Chest 2 View  Result Date: 10/05/2017 CLINICAL DATA:  67 y/o F; cough, fever. Patient recently started chemotherapy for breast cancer. EXAM: CHEST - 2 VIEW COMPARISON:  09/14/2017 chest radiograph. FINDINGS: Stable normal cardiac silhouette given projection and technique. Aortic atherosclerosis with calcification. Right port catheter tip projects over lower SVC and is stable. No focal consolidation. No pleural effusion or pneumothorax. No acute osseous abnormality is evident.  IMPRESSION: No acute pulmonary process identified. Electronically Signed   By: Kristine Garbe M.D.   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  PLAN:    1. 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  Case was also discussed extensively at case conference. Given the size and the stage of patient's malignancy, she will benefit from neoadjuvant chemotherapy using Adriamycin, Cytoxan, and Taxol.  Patient will likely require adjuvant XRT followed by an aromatase inhibitor for 5 years at the conclusion of her treatments.  CT scan of the chest, abdomen, pelvis completed on August 01, 2017 did not reveal any metastatic disease.  MUGA scan on August 02, 2017 revealed EF of 61% which is adequate to proceed with treatment.  Given patient's recent admission to the hospital and decreased performance status, will delay cycle 2 of weekly Taxol.  Will dose reduce Taxol with next treatment.  Patient will instead receive IV fluids.  Return to clinic in 1 week for further evaluation and reconsideration of cycle 2.  2.  UTI: Resolved.  Continue current antibiotics. 3.  Anemia: Patient's hemoglobin has trended up since the hospital and is now 8.0.  Monitor. 4.  Neutropenia: Resolved.  Will dose reduce Taxol as above. 5.  Renal insufficiency: Resolved.     Patient expressed understanding and was in agreement with this plan. She also understands that She can call clinic at any time with any questions, concerns, or complaints.   Cancer Staging Breast cancer, stage 2, left ([place]) Staging form: Breast, AJCC 8th Edition - Clinical stage from 07/30/2017: Stage IIA (cT2, cN1, cM0, G2, ER+, PR+, HER2-) - Signed by Lloyd Huger, MD on 07/30/2017   Lloyd Huger, MD   10/13/2017 9:17  AM

## 2017-10-06 NOTE — Progress Notes (Signed)
[place] [facility] [place] NAME: Alicia [name]    MR#:  [id number]  DATE OF BIRTH:  Mar 06, 1950  SUBJECTIVE:  CHIEF COMPLAINT:   Chief Complaint  Patient presents with  . Fever  . Hypotension   - fevers still, labs with low blood counts today - patient feeling stronger though  REVIEW OF SYSTEMS:  Review of Systems  Constitutional: Positive for fever and malaise/fatigue. Negative for chills.  HENT: Negative for congestion, ear discharge, hearing loss and nosebleeds.   Eyes: Negative for blurred vision and double vision.  Respiratory: Negative for cough, shortness of breath and wheezing.   Cardiovascular: Negative for chest pain and palpitations.  Gastrointestinal: Positive for diarrhea. Negative for abdominal pain, constipation, nausea and vomiting.  Genitourinary: Negative for dysuria.  Neurological: Negative for dizziness, seizures and headaches.    DRUG ALLERGIES:  No Known Allergies  VITALS:  Blood pressure 97/63, pulse 72, temperature 98.2 F (36.8 C), temperature source Oral, resp. rate 20, height 5' 6"  (1.676 m), weight 96.1 kg, SpO2 98 %.  PHYSICAL EXAMINATION:  Physical Exam  GENERAL:  67 y.o.-year-old patient lying in the bed with no acute distress.  Warm to touch. EYES: Pupils equal, round, reactive to light and accommodation. No scleral icterus. Extraocular muscles intact.  HEENT: Head atraumatic, normocephalic. Oropharynx and nasopharynx clear.  NECK:  Supple, no jugular venous distention. No thyroid enlargement, no tenderness.  LUNGS: Normal breath sounds bilaterally, no wheezing, rales,rhonchi or crepitation. No use of accessory muscles of respiration.  CARDIOVASCULAR: S1, S2 normal. No murmurs, rubs, or gallops.  -Right chest Port-A-Cath ABDOMEN: Soft, nontender, nondistended. Bowel sounds present. No organomegaly or mass.  EXTREMITIES: No pedal edema, cyanosis, or clubbing.  NEUROLOGIC: Cranial nerves II through  XII are intact. Muscle strength 5/5 in all extremities. Sensation intact. Gait not checked.  PSYCHIATRIC: The patient is alert and oriented x 3.  SKIN: No obvious rash, lesion, or ulcer.    LABORATORY PANEL:   CBC Recent Labs  Lab 10/06/17 0448  WBC 2.3*  HGB 7.9*  HCT 21.8*  PLT 191   ------------------------------------------------------------------------------------------------------------------  Chemistries  Recent Labs  Lab 10/04/17 2326 10/06/17 0448  NA 137 136  K 3.3* 3.4*  CL 107 108  CO2 22 23  GLUCOSE 128* 109*  BUN 12 7*  CREATININE 0.90 0.71  CALCIUM 8.1* 7.9*  AST 33  --   ALT 24  --   ALKPHOS 93  --   BILITOT 0.6  --    ------------------------------------------------------------------------------------------------------------------  Cardiac Enzymes No results for input(s): TROPONINI in the last 168 hours. ------------------------------------------------------------------------------------------------------------------  RADIOLOGY:  Dg Chest 2 View  Result Date: 10/05/2017 CLINICAL DATA:  67 y/o F; cough, fever. Patient recently started chemotherapy for breast cancer. EXAM: CHEST - 2 VIEW COMPARISON:  09/14/2017 chest radiograph. FINDINGS: Stable normal cardiac silhouette given projection and technique. Aortic atherosclerosis with calcification. Right port catheter tip projects over lower SVC and is stable. No focal consolidation. No pleural effusion or pneumothorax. No acute osseous abnormality is evident. IMPRESSION: No acute pulmonary process identified. Electronically Signed   By: Kristine Garbe M.D.   On: 10/05/2017 00:22    EKG:   Orders placed or performed during the hospital encounter of 10/04/17  . ED EKG 12-Lead  . 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  1.  Fevers-secondary to urinary tract infection -Cultures are growing pan sensitive E.coli.  Chest x-ray is negative -Not neutropenic at this time. -on Rocephin at this time.  On probiotics - change to orals tomorrow if fevers improving  2.  Breast cancer-stage IIa, ER/PR positive, HER-2 negative invasive left breast cancer status post lumpectomy. -Following up with cancer center.  Completed Adriamycin and Cytoxan and currently just received cycle 1 of 12 weekly Taxol - Continue to monitor blood counts. - wbc slowly trending down- check cbc with diff in AM  3.  Acute on chronic anemia-secondary to chemotherapy.  - slowly dropping. Continue to monitor closely.   -No acute indication for blood transfusion unless hb <7  4.  Hypertension-on Norvasc.  5.  DVT prophylaxis-Lovenox  Up and ambulatory at baseline   All the records are reviewed and case discussed with Care Management/Social Workerr. Management plans discussed with the patient, family and they are in agreement.  CODE STATUS: Full Code  TOTAL TIME TAKING CARE OF THIS PATIENT: 37 minutes.   POSSIBLE D/C IN 2 DAYS, DEPENDING ON CLINICAL CONDITION.   Gladstone Lighter M.D on 10/06/2017 at 12:57 PM  Between 7am to 6pm - Pager - [phone]  After 6pm go to [email or web address] - password [email or web address] [facility]  Office  [phone]  CC: Primary care physician; [name], MD

## 2017-10-07 ENCOUNTER — Other Ambulatory Visit: Payer: Self-pay | Admitting: *Deleted

## 2017-10-07 ENCOUNTER — Encounter: Payer: Medicare Other | Admitting: Oncology

## 2017-10-07 ENCOUNTER — Inpatient Hospital Stay: Payer: Medicare Other

## 2017-10-07 ENCOUNTER — Telehealth: Payer: Self-pay | Admitting: *Deleted

## 2017-10-07 DIAGNOSIS — N390 Urinary tract infection, site not specified: Secondary | ICD-10-CM

## 2017-10-07 DIAGNOSIS — Z79899 Other long term (current) drug therapy: Secondary | ICD-10-CM

## 2017-10-07 DIAGNOSIS — D649 Anemia, unspecified: Secondary | ICD-10-CM

## 2017-10-07 DIAGNOSIS — C50919 Malignant neoplasm of unspecified site of unspecified female breast: Secondary | ICD-10-CM

## 2017-10-07 DIAGNOSIS — F17210 Nicotine dependence, cigarettes, uncomplicated: Secondary | ICD-10-CM

## 2017-10-07 LAB — CBC WITH DIFFERENTIAL/PLATELET
Basophils Absolute: 0 10*3/uL (ref 0–0.1)
Basophils Relative: 1 %
Eosinophils Absolute: 0 10*3/uL (ref 0–0.7)
Eosinophils Relative: 0 %
HCT: 21.5 % — ABNORMAL LOW (ref 35.0–47.0)
Hemoglobin: 7.5 g/dL — ABNORMAL LOW (ref 12.0–16.0)
Lymphocytes Relative: 16 %
Lymphs Abs: 0.2 10*3/uL — ABNORMAL LOW (ref 1.0–3.6)
MCH: 30.6 pg (ref 26.0–34.0)
MCHC: 34.8 g/dL (ref 32.0–36.0)
MCV: 87.8 fL (ref 80.0–100.0)
Monocytes Absolute: 0.1 10*3/uL — ABNORMAL LOW (ref 0.2–0.9)
Monocytes Relative: 10 %
Neutro Abs: 1.1 10*3/uL — ABNORMAL LOW (ref 1.4–6.5)
Neutrophils Relative %: 73 %
Platelets: 202 10*3/uL (ref 150–440)
RBC: 2.45 MIL/uL — ABNORMAL LOW (ref 3.80–5.20)
RDW: 17.9 % — ABNORMAL HIGH (ref 11.5–14.5)
WBC: 1.6 10*3/uL — ABNORMAL LOW (ref 3.6–11.0)

## 2017-10-07 MED ORDER — RISAQUAD PO CAPS: 1.0000 | ORAL_CAPSULE | Freq: Every day | ORAL | 0 refills | Status: DC

## 2017-10-07 MED ORDER — CEPHALEXIN 500 MG PO CAPS: 500.0000 mg | ORAL_CAPSULE | Freq: Three times a day (TID) | ORAL | 0 refills | Status: AC

## 2017-10-07 MED ORDER — HEPARIN SOD (PORK) LOCK FLUSH 100 UNIT/ML IV SOLN
500.0000 [IU] | Freq: Once | INTRAVENOUS | Status: AC
  Administered 2017-10-07: 500 [IU] via INTRAVENOUS
  Filled 2017-10-07: qty 5

## 2017-10-07 MED ORDER — CEPHALEXIN 500 MG PO CAPS
500.0000 mg | ORAL_CAPSULE | Freq: Three times a day (TID) | ORAL | Status: DC
  Administered 2017-10-07: 09:00:00 500 mg via ORAL
  Filled 2017-10-07: qty 1

## 2017-10-07 NOTE — Discharge Summary (Signed)
[place] at [place] NAME: Alicia [name]    MR#:  [id number]  DATE OF BIRTH:  1950-09-28  DATE OF ADMISSION:  10/04/2017   ADMITTING PHYSICIAN: Harrie Foreman, MD  DATE OF DISCHARGE: 10/07/17  PRIMARY CARE PHYSICIAN: [name], MD   ADMISSION DIAGNOSIS:   Sepsis, due to unspecified organism ([place]) [A41.9] Urinary tract infection without hematuria, site unspecified [N39.0]  DISCHARGE DIAGNOSIS:   Active Problems:   Sepsis ([place])   SECONDARY DIAGNOSIS:   Past Medical History:  Diagnosis Date  . Anxiety   . Cancer ([place]) 07/2017   left breast  . Depression   . Hypertension   . Hypothyroidism   . Rapid heart rate   . 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  1.  Fevers-secondary to urinary tract infection -Cultures are growing pan sensitive E.coli.  Chest x-ray is negative -on Rocephin here in the hospital.  Will be discharged on oral Keflex. -Remained afebrile for the last 24 hours  2.  Breast cancer-stage IIa, ER/PR positive, HER-2 negative invasive left breast cancer status post lumpectomy. -Following up with cancer center.  Completed Adriamycin and Cytoxan and currently just received cycle 1 of 12 weekly Taxol - Continue to monitor blood counts. -Appreciate oncology consult  3.  Acute on chronic anemia-secondary to chemotherapy.  -Dropping but hemoglobin stable at 7.5 today.  No indication for transfusion.. Continue to monitor closely.    4.  Hypertension-on Norvasc.  5.  Neutropenia-secondary to recent chemotherapy.  ANC count is 1100. -Oncology consult is appreciated.  They have recommended outpatient follow-up at this time.  Up and ambulatory at baseline  DISCHARGE  CONDITIONS:   Guarded  CONSULTS OBTAINED:   Treatment Team:  Lloyd Huger, MD  DRUG ALLERGIES:   No Known Allergies DISCHARGE MEDICATIONS:   Allergies as of 10/07/2017   No Known Allergies     Medication List    STOP taking these medications   ciprofloxacin 500 MG tablet Commonly known as:  CIPRO   phenazopyridine 200 MG tablet Commonly known as:  PYRIDIUM     TAKE these medications   acidophilus Caps capsule Take 1 capsule by mouth daily. Start taking on:  10/08/2017   albuterol 108 (90 Base) MCG/ACT inhaler Commonly known as:  PROVENTIL HFA;VENTOLIN HFA Inhale 2 puffs into the lungs every 6 (six) hours as needed for wheezing or shortness of breath.   ALPRAZolam 0.25 MG tablet Commonly known as:  XANAX Take 0.25 mg by mouth 2 (two) times daily.   amLODipine 5 MG tablet Commonly known as:  NORVASC Take 5 mg by mouth daily.   aspirin EC 81 MG tablet Take 81 mg by mouth daily.   cephALEXin 500 MG capsule Commonly known as:  KEFLEX Take 1 capsule (500 mg total) by mouth every 8 (eight) hours for 7 days.   HYDROcodone-acetaminophen 5-325 MG tablet Commonly known as:  NORCO/VICODIN Take 1 tablet by mouth every 4 (four) hours as needed for moderate pain.   levothyroxine 100 MCG tablet Commonly known as:  SYNTHROID, LEVOTHROID Take 100 mcg by mouth daily before breakfast.   lidocaine-prilocaine cream Commonly known as:  EMLA Apply to affected area once What changed:    how much to take  how to take  this  when to take this  additional instructions   LYSINE PO Take 1 Dose by mouth as needed (fever blister).   ondansetron 8 MG tablet Commonly known as:  ZOFRAN Take 1 tablet (8 mg total) by mouth 2 (two) times daily as needed. Start on the third day after chemotherapy.   Potassium 99 MG Tabs Take 99 mg by mouth daily.   prochlorperazine 10 MG tablet Commonly known as:  COMPAZINE TAKE 1 TABLET(10 MG) BY MOUTH EVERY 6 HOURS AS NEEDED FOR  NAUSEA OR VOMITING What changed:  See the new instructions.   traZODone 50 MG tablet Commonly known as:  DESYREL Take 50 mg by mouth at bedtime.   TYLENOL 500 MG tablet Generic drug:  acetaminophen Take 1,000 mg by mouth every 6 (six) hours as needed for fever.   valACYclovir 1000 MG tablet Commonly known as:  VALTREX Take 1 tablet (1,000 mg total) by mouth 2 (two) times daily.        DISCHARGE INSTRUCTIONS:   1. PCP f/u in 1-2 weeks 2. Oncology f/u in 3 days as scheduled  DIET:   Cardiac diet  ACTIVITY:   Activity as tolerated  OXYGEN:   Home Oxygen:  NO Oxygen Delivery: room air  DISCHARGE LOCATION:   home   If you experience worsening of your admission symptoms, develop shortness of breath, life threatening emergency, suicidal or homicidal thoughts you must seek medical attention immediately by calling 911 or calling your MD immediately  if symptoms less severe.  You Must read complete instructions/literature along with all the possible adverse reactions/side effects for all the Medicines you take and that have been prescribed to you. Take any new Medicines after you have completely understood and accpet all the possible adverse reactions/side effects.   Please note  You were cared for by a hospitalist during your hospital stay. If you have any questions about your discharge medications or the care you received while you were in the hospital after you are discharged, you can call the unit and asked to speak with the hospitalist on call if the hospitalist that took care of you is not available. Once you are discharged, your primary care physician will handle any further medical issues. Please note that NO REFILLS for any discharge medications will be authorized once you are discharged, as it is imperative that you return to your primary care physician (or establish a relationship with a primary care physician if you do not have one) for your aftercare needs so that  they can reassess your need for medications and monitor your lab values.    On the day of Discharge:  VITAL SIGNS:   Blood pressure (!) 113/59, pulse 82, temperature 98.7 F (37.1 C), temperature source Oral, resp. rate 19, height 5' 6" (1.676 m), weight 96.8 kg, SpO2 98 %.  PHYSICAL EXAMINATION:   GENERAL:  67 y.o.-year-old patient lying in the bed with no acute distress.  Warm to touch. EYES: Pupils equal, round, reactive to light and accommodation. No scleral icterus. Extraocular muscles intact.  HEENT: Head atraumatic, normocephalic. Oropharynx and nasopharynx clear.  NECK:  Supple, no jugular venous distention. No thyroid enlargement, no tenderness.  LUNGS: Normal breath sounds bilaterally, no wheezing, rales,rhonchi or crepitation. No use of accessory muscles of respiration.  CARDIOVASCULAR: S1, S2 normal. No murmurs, rubs, or gallops.  -Right chest Port-A-Cath ABDOMEN: Soft, nontender, nondistended. Bowel sounds present. No organomegaly or mass.  EXTREMITIES: No pedal edema, cyanosis, or clubbing.  NEUROLOGIC: Cranial nerves  II through XII are intact. Muscle strength 5/5 in all extremities. Sensation intact. Gait not checked.  PSYCHIATRIC: The patient is alert and oriented x 3.  SKIN: No obvious rash, lesion, or ulcer.   DATA REVIEW:   CBC Recent Labs  Lab 10/07/17 0533  WBC 1.6*  HGB 7.5*  HCT 21.5*  PLT 202    Chemistries  Recent Labs  Lab 10/04/17 2326 10/06/17 0448  NA 137 136  K 3.3* 3.4*  CL 107 108  CO2 22 23  GLUCOSE 128* 109*  BUN 12 7*  CREATININE 0.90 0.71  CALCIUM 8.1* 7.9*  AST 33  --   ALT 24  --   ALKPHOS 93  --   BILITOT 0.6  --      Microbiology Results  Results for orders placed or performed during the hospital encounter of 10/04/17  Blood Culture (routine x 2)     Status: None (Preliminary result)   Collection Time: 10/04/17 11:26 PM  Result Value Ref Range Status   Specimen Description BLOOD RIGHT CHEST  Final   Special  Requests   Final    BOTTLES DRAWN AEROBIC AND ANAEROBIC Blood Culture adequate volume   Culture   Final    NO GROWTH 3 DAYS Performed at [facility], [address] [place], [place] [address]    Report Status PENDING  Incomplete  Blood Culture (routine x 2)     Status: None (Preliminary result)   Collection Time: 10/04/17 11:56 PM  Result Value Ref Range Status   Specimen Description BLOOD RAC  Final   Special Requests   Final    BOTTLES DRAWN AEROBIC AND ANAEROBIC Blood Culture results may not be optimal due to an excessive volume of blood received in culture bottles   Culture   Final    NO GROWTH 2 DAYS Performed at [facility], [address], [place], [place] [address]    Report Status PENDING  Incomplete    RADIOLOGY:  No results found.   Management plans discussed with the patient, family and they are in agreement.  CODE STATUS:     Code Status Orders  (From admission, onward)         Start     Ordered   10/05/17 0541  Full code  Continuous     10/05/17 0541        Code Status History    Date Active Date Inactive Code Status Order ID Comments User Context   02/17/2015 2233 02/18/2015 1844 Full Code [id number]  Demetrios Loll, MD Inpatient      TOTAL TIME TAKING CARE OF THIS PATIENT: 38 minutes.    Gladstone Lighter M.D on 10/07/2017 at 3:06 PM  Between 7am to 6pm - Pager - [phone]  After 6pm go to [email or web address] - Proofreader  [facility]  Office  [phone]  CC: Primary care physician; [name], MD   Note: This dictation was prepared with Dragon dictation along with smaller phrase technology. Any transcriptional errors that result from this process are unintentional.

## 2017-10-07 NOTE — Telephone Encounter (Signed)
Patient admitted, appointment for today canceled.

## 2017-10-07 NOTE — Plan of Care (Signed)
  Problem: Health Behavior/Discharge Planning: Goal: Ability to manage health-related needs will improve Outcome: Progressing   Problem: Clinical Measurements: Goal: Ability to maintain clinical measurements within normal limits will improve Outcome: Progressing Goal: Will remain free from infection Outcome: Progressing Goal: Respiratory complications will improve Outcome: Progressing   Problem: Activity: Goal: Risk for activity intolerance will decrease Outcome: Progressing   Problem: Nutrition: Goal: Adequate nutrition will be maintained Outcome: Progressing   Problem: Coping: Goal: Level of anxiety will decrease Outcome: Progressing   Problem: Elimination: Goal: Will not experience complications related to bowel motility Outcome: Progressing   Problem: Pain Managment: Goal: General experience of comfort will improve Outcome: Progressing   Problem: Safety: Goal: Ability to remain free from injury will improve Outcome: Progressing   Problem: Skin Integrity: Goal: Risk for impaired skin integrity will decrease Outcome: Progressing   Problem: Education: Goal: Knowledge of General Education information will improve Description Including pain rating scale, medication(s)/side effects and non-pharmacologic comfort measures Outcome: Progressing   

## 2017-10-07 NOTE — Consult Note (Signed)
[place]  [phone] [phone]) [phone]  ID: JOLLY CARLINI OB: November 13, 1950  MR#: [id number]  [id number]#:[id number]  Patient Care Team: [name], MD as PCP - General (Internal Medicine) Bary Castilla Forest Gleason, MD as Consulting Physician (General Surgery)  CHIEF COMPLAINT: Stage IIa breast cancer, UTI/fevers.  INTERVAL HISTORY: Patient is a 67 year old female who is actively receiving chemotherapy with Taxol for the above-stated breast cancer.  Her last infusion was on October 04, 2018.  She is admitted to the hospital with UTI, fevers, and decrease in blood counts.  She feels improved since admission, but not back to her baseline.  She has had no fevers today.  She has persistent weakness and fatigue.  She has no neurologic complaints.  She has a fair appetite, but denies weight loss.  She has no chest pain or shortness of breath.  She denies any nausea, vomiting, constipation, or diarrhea.  She has no further urinary complaints today.  Patient otherwise feels well and offers no further specific complaints.  REVIEW OF SYSTEMS:   Review of Systems  Constitutional: Positive for malaise/fatigue. Negative for fever and weight loss.  Respiratory: Negative.  Negative for cough, hemoptysis and shortness of breath.   Cardiovascular: Negative.  Negative for chest pain and leg swelling.  Gastrointestinal: Negative.  Negative for abdominal pain.  Genitourinary: Negative.  Negative for dysuria and frequency.  Musculoskeletal: Negative.  Negative for back pain.  Skin: Negative.  Negative for rash.  Neurological: Positive for weakness. Negative for dizziness, focal weakness and headaches.  Psychiatric/Behavioral: Negative.  The patient is not nervous/anxious.     As per HPI. Otherwise, a complete review of systems is negative.  PAST MEDICAL HISTORY: Past Medical History:  Diagnosis Date  . Anxiety   . Cancer ([place]) 07/2017   left breast  . Depression   .  Hypertension   . Hypothyroidism   . Rapid heart rate   . Thyroid disease     PAST SURGICAL HISTORY: Past Surgical History:  Procedure Laterality Date  . AXILLARY LYMPH NODE BIOPSY Left 07/16/2017   METASTATIC MAMMARY CARCINOMA  . BREAST BIOPSY Left 07/16/2017   [place] bx of left breast mass and left breast LN.  INVASIVE MAMMARY CARCINOMA, NO SPECIAL TYPE.   Marland Kitchen BREAST EXCISIONAL BIOPSY Right 2001  . BREAST LUMPECTOMY Right    2001  . COLONOSCOPY    . PORTACATH PLACEMENT Right 08/07/2017   Procedure: INSERTION PORT-A-CATH;  Surgeon: Robert Bellow, MD;  Location: [facility] ORS;  Service: General;  Laterality: Right;  . TONSILLECTOMY      FAMILY HISTORY: Family History  Problem Relation Age of Onset  . Stroke Mother   . Thyroid disease Mother   . Renal Disease Mother   . Stroke Father   . Heart attack Father   . Sudden death Father 64       suicide  . Anuerysm Brother   . Breast cancer Neg Hx     ADVANCED DIRECTIVES (Y/N):  '@ADVDIR'$ @  HEALTH MAINTENANCE: Social History   Tobacco Use  . Smoking status: Current Some Day Smoker    Packs/day: 0.50    Years: 30.00    Pack years: 15.00    Types: Cigarettes  . Smokeless tobacco: Never Used  Substance Use Topics  . Alcohol use: Yes    Comment: occasional q 19mo  . Drug use: Never     Colonoscopy:  PAP:  Bone density:  Lipid panel:  No Known Allergies  Current Facility-Administered Medications  Medication Dose Route Frequency Provider Last Rate Last Dose  . 0.9 %  sodium chloride infusion   Intravenous Continuous Gladstone Lighter, MD 75 mL/hr at 10/07/17 0933    . acetaminophen (TYLENOL) tablet 650 mg  650 mg Oral Q6H PRN Harrie Foreman, MD   650 mg at 10/06/17 0421   Or  . acetaminophen (TYLENOL) suppository 650 mg  650 mg Rectal Q6H PRN Harrie Foreman, MD      . acidophilus (RISAQUAD) capsule 1 capsule  1 capsule Oral Daily Gladstone Lighter, MD   1 capsule at 10/07/17 0926  . albuterol (PROVENTIL) (2.5  MG/3ML) 0.083% nebulizer solution 2.5 mg  2.5 mg Nebulization Q4H PRN Harrie Foreman, MD      . ALPRAZolam Duanne Moron) tablet 0.25 mg  0.25 mg Oral BID Harrie Foreman, MD   0.25 mg at 10/07/17 0925  . alum & mag hydroxide-simeth (MAALOX/MYLANTA) 200-200-20 MG/5ML suspension 30 mL  30 mL Oral Q4H PRN Gladstone Lighter, MD   30 mL at 10/06/17 2131  . amLODipine (NORVASC) tablet 5 mg  5 mg Oral Daily Harrie Foreman, MD   5 mg at 10/07/17 0925  . aspirin EC tablet 81 mg  81 mg Oral Daily Harrie Foreman, MD   81 mg at 10/07/17 1694  . cephALEXin (KEFLEX) capsule 500 mg  500 mg Oral Q8H Gladstone Lighter, MD   500 mg at 10/07/17 0925  . docusate sodium (COLACE) capsule 100 mg  100 mg Oral BID Harrie Foreman, MD      . enoxaparin (LOVENOX) injection 40 mg  40 mg Subcutaneous Q24H Harrie Foreman, MD   40 mg at 10/06/17 2059  . HYDROcodone-acetaminophen (NORCO/VICODIN) 5-325 MG per tablet 1 tablet  1 tablet Oral Q4H PRN Harrie Foreman, MD      . levothyroxine (SYNTHROID, LEVOTHROID) tablet 100 mcg  100 mcg Oral QAC breakfast Harrie Foreman, MD   100 mcg at 10/07/17 0925  . loperamide (IMODIUM) capsule 2 mg  2 mg Oral Q6H PRN Gladstone Lighter, MD   2 mg at 10/05/17 1921  . ondansetron (ZOFRAN) tablet 4 mg  4 mg Oral Q6H PRN Harrie Foreman, MD       Or  . ondansetron [facility]) injection 4 mg  4 mg Intravenous Q6H PRN Harrie Foreman, MD      . phenazopyridine (PYRIDIUM) tablet 200 mg  200 mg Oral TID PRN Harrie Foreman, MD      . prochlorperazine (COMPAZINE) tablet 10 mg  10 mg Oral Q6H PRN Harrie Foreman, MD      . protein supplement (PREMIER PROTEIN) liquid - approved for s/p bariatric surgery  11 oz Oral BID BM Gladstone Lighter, MD   11 oz at 10/07/17 0926  . traZODone (DESYREL) tablet 50 mg  50 mg Oral QHS Harrie Foreman, MD   50 mg at 10/06/17 2059  . valACYclovir (VALTREX) tablet 1,000 mg  1,000 mg Oral BID Harrie Foreman, MD   1,000 mg at 10/07/17  0925    OBJECTIVE: Vitals:   10/06/17 1938 10/07/17 0405  BP: 112/67 (!) 113/59  Pulse: 84 82  Resp: 19 19  Temp: 98.3 F (36.8 C) 98.7 F (37.1 C)  SpO2: 98% 98%     Body mass index is 34.44 kg/m.    ECOG FS:2 - Symptomatic, <50% confined to bed  General: Ill-appearing, no acute distress. Eyes: Pink conjunctiva, anicteric sclera. HEENT: Normocephalic, moist mucous membranes,  clear oropharnyx. Lungs: Clear to auscultation bilaterally. Heart: Regular rate and rhythm. No rubs, murmurs, or gallops. Abdomen: Soft, nontender, nondistended. No organomegaly noted, normoactive bowel sounds. Musculoskeletal: No edema, cyanosis, or clubbing. Neuro: Alert, answering all questions appropriately. Cranial nerves grossly intact. Skin: No rashes or petechiae noted. Psych: Flat affect.  LAB RESULTS:  Lab Results  Component Value Date   NA 136 10/06/2017   K 3.4 (L) 10/06/2017   CL 108 10/06/2017   CO2 23 10/06/2017   GLUCOSE 109 (H) 10/06/2017   BUN 7 (L) 10/06/2017   CREATININE 0.71 10/06/2017   CALCIUM 7.9 (L) 10/06/2017   PROT 6.2 (L) 10/04/2017   ALBUMIN 3.5 10/04/2017   AST 33 10/04/2017   ALT 24 10/04/2017   ALKPHOS 93 10/04/2017   BILITOT 0.6 10/04/2017   GFRNONAA >60 10/06/2017   GFRAA >60 10/06/2017    Lab Results  Component Value Date   WBC 1.6 (L) 10/07/2017   NEUTROABS 1.1 (L) 10/07/2017   HGB 7.5 (L) 10/07/2017   HCT 21.5 (L) 10/07/2017   MCV 87.8 10/07/2017   PLT 202 10/07/2017     STUDIES: Dg Chest 1 View  Result Date: 09/14/2017 CLINICAL DATA:  67 year old female with history of fever. History of cancer. EXAM: CHEST  1 VIEW COMPARISON:  Chest x-ray 08/07/2017. FINDINGS: Right subclavian single-lumen porta cath with tip terminating in the superior cavoatrial junction. Mild diffuse peribronchial cuffing. Lung volumes are low. No consolidative airspace disease. No pleural effusions. No pneumothorax. No pulmonary nodule or mass noted. Pulmonary vasculature  and the cardiomediastinal silhouette are within normal limits. Aortic atherosclerosis. IMPRESSION: 1. Mild diffuse peribronchial cuffing, concerning for acute bronchitis. 2. Aortic atherosclerosis. Electronically Signed   By: Vinnie Langton M.D.   On: 09/14/2017 22:25   Dg Chest 2 View  Result Date: 10/05/2017 CLINICAL DATA:  67 y/o F; cough, fever. Patient recently started chemotherapy for breast cancer. EXAM: CHEST - 2 VIEW COMPARISON:  09/14/2017 chest radiograph. FINDINGS: Stable normal cardiac silhouette given projection and technique. Aortic atherosclerosis with calcification. Right port catheter tip projects over lower SVC and is stable. No focal consolidation. No pleural effusion or pneumothorax. No acute osseous abnormality is evident. IMPRESSION: No acute pulmonary process identified. Electronically Signed   By: Kristine Garbe M.D.   On: 10/05/2017 00:22    ASSESSMENT: Stage IIa breast cancer, UTI/fevers.  PLAN:    1.  Breast cancer: Patient last received single agent Taxol on October 03, 2017.  She has been instructed to keep her previously scheduled follow-up appointment on Thursday, October 10, 2017 for further evaluation and consideration of continuation of treatment.  Given her pancytopenia, will likely dose reduce her treatments. 2.  UTI/fever: Patient is currently afebrile.  Okay to discharge from an oncology standpoint with oral antibiotics. 3.  Leukopenia: Patient's ANC is 1100 today.  Secondary to chemotherapy.  Continue antibiotics as above and follow-up this Thursday for repeat laboratory work and further evaluation. 4.  Anemia: Patient's hemoglobin is 7.5 today.  She does not require blood transfusion, but if her hemoglobin trends down will consider one in the near future. 5.  Disposition: Okay to discharge from hospital today from oncology standpoint if patient remains afebrile.  Appreciate consult, call with questions.   Cancer Staging Breast cancer,  stage 2, left ([place]) Staging form: Breast, AJCC 8th Edition - Clinical stage from 07/30/2017: Stage IIA (cT2, cN1, cM0, G2, ER+, PR+, HER2-) - Signed by Lloyd Huger, MD on 07/30/2017   Lloyd Huger,  MD   10/07/2017 12:52 PM

## 2017-10-07 NOTE — Progress Notes (Signed)
Patient discharged home per MD orders. All discharge instructions given and all questions answered. 

## 2017-10-09 LAB — CULTURE, BLOOD (ROUTINE X 2)
Culture: NO GROWTH
Special Requests: ADEQUATE

## 2017-10-10 ENCOUNTER — Inpatient Hospital Stay: Payer: Medicare Other

## 2017-10-10 ENCOUNTER — Encounter: Payer: Self-pay | Admitting: Oncology

## 2017-10-10 ENCOUNTER — Encounter: Payer: Self-pay | Admitting: *Deleted

## 2017-10-10 ENCOUNTER — Other Ambulatory Visit: Payer: Self-pay

## 2017-10-10 ENCOUNTER — Inpatient Hospital Stay (HOSPITAL_BASED_OUTPATIENT_CLINIC_OR_DEPARTMENT_OTHER): Payer: Medicare Other | Admitting: Oncology

## 2017-10-10 VITALS — BP 117/74 | HR 78 | Temp 97.0°F | Resp 18 | Wt 211.6 lb

## 2017-10-10 DIAGNOSIS — C50412 Malignant neoplasm of upper-outer quadrant of left female breast: Secondary | ICD-10-CM | POA: Diagnosis not present

## 2017-10-10 DIAGNOSIS — C50912 Malignant neoplasm of unspecified site of left female breast: Secondary | ICD-10-CM

## 2017-10-10 DIAGNOSIS — E871 Hypo-osmolality and hyponatremia: Secondary | ICD-10-CM | POA: Diagnosis not present

## 2017-10-10 DIAGNOSIS — I10 Essential (primary) hypertension: Secondary | ICD-10-CM | POA: Diagnosis not present

## 2017-10-10 DIAGNOSIS — R531 Weakness: Secondary | ICD-10-CM

## 2017-10-10 DIAGNOSIS — Z5111 Encounter for antineoplastic chemotherapy: Secondary | ICD-10-CM | POA: Diagnosis not present

## 2017-10-10 DIAGNOSIS — R5383 Other fatigue: Secondary | ICD-10-CM | POA: Diagnosis not present

## 2017-10-10 DIAGNOSIS — Z170 Estrogen receptor positive status [ER+]: Secondary | ICD-10-CM

## 2017-10-10 DIAGNOSIS — D649 Anemia, unspecified: Secondary | ICD-10-CM

## 2017-10-10 DIAGNOSIS — Z5189 Encounter for other specified aftercare: Secondary | ICD-10-CM | POA: Diagnosis not present

## 2017-10-10 LAB — CBC WITH DIFFERENTIAL/PLATELET
Basophils Absolute: 0 10*3/uL (ref 0–0.1)
Basophils Relative: 1 %
Eosinophils Absolute: 0 10*3/uL (ref 0–0.7)
Eosinophils Relative: 0 %
HCT: 23 % — ABNORMAL LOW (ref 35.0–47.0)
Hemoglobin: 8 g/dL — ABNORMAL LOW (ref 12.0–16.0)
Lymphocytes Relative: 9 %
Lymphs Abs: 0.6 10*3/uL — ABNORMAL LOW (ref 1.0–3.6)
MCH: 30.8 pg (ref 26.0–34.0)
MCHC: 34.8 g/dL (ref 32.0–36.0)
MCV: 88.4 fL (ref 80.0–100.0)
Monocytes Absolute: 0.8 10*3/uL (ref 0.2–0.9)
Monocytes Relative: 12 %
Neutro Abs: 4.9 10*3/uL (ref 1.4–6.5)
Neutrophils Relative %: 78 %
Platelets: 347 10*3/uL (ref 150–440)
RBC: 2.6 MIL/uL — ABNORMAL LOW (ref 3.80–5.20)
RDW: 18.4 % — ABNORMAL HIGH (ref 11.5–14.5)
WBC: 6.3 10*3/uL (ref 3.6–11.0)

## 2017-10-10 LAB — COMPREHENSIVE METABOLIC PANEL
ALT: 53 U/L — ABNORMAL HIGH (ref 0–44)
AST: 41 U/L (ref 15–41)
Albumin: 3.4 g/dL — ABNORMAL LOW (ref 3.5–5.0)
Alkaline Phosphatase: 76 U/L (ref 38–126)
Anion gap: 7 (ref 5–15)
BUN: 10 mg/dL (ref 8–23)
CO2: 24 mmol/L (ref 22–32)
Calcium: 9 mg/dL (ref 8.9–10.3)
Chloride: 108 mmol/L (ref 98–111)
Creatinine, Ser: 0.82 mg/dL (ref 0.44–1.00)
GFR calc Af Amer: >60 mL/min (ref >60)
GFR calc non Af Amer: >60 mL/min (ref >60)
Glucose, Bld: 102 mg/dL — ABNORMAL HIGH (ref 70–99)
Potassium: 3.3 mmol/L — ABNORMAL LOW (ref 3.5–5.1)
Sodium: 139 mmol/L (ref 135–145)
Total Bilirubin: 0.6 mg/dL (ref 0.3–1.2)
Total Protein: 6.2 g/dL — ABNORMAL LOW (ref 6.5–8.1)

## 2017-10-10 LAB — CULTURE, BLOOD (ROUTINE X 2): Culture: NO GROWTH

## 2017-10-10 LAB — SAMPLE TO BLOOD BANK

## 2017-10-10 MED ORDER — SODIUM CHLORIDE 0.9 % IV SOLN
Freq: Once | INTRAVENOUS | Status: AC
  Administered 2017-10-10: 11:00:00 via INTRAVENOUS
  Filled 2017-10-10: qty 250

## 2017-10-10 MED ORDER — HEPARIN SOD (PORK) LOCK FLUSH 100 UNIT/ML IV SOLN
500.0000 [IU] | Freq: Once | INTRAVENOUS | Status: AC | PRN
  Administered 2017-10-10: 500 [IU]
  Filled 2017-10-10: qty 5

## 2017-10-10 NOTE — Progress Notes (Signed)
Patient here for follow. States she was in the hospital from Friday thru Monday due to fever. She started on abt for UTI and it caused her to have diarrhea. Currently having loose bowel movements not as severe as when she was in the hospital. Pt feeling weak.

## 2017-10-14 NOTE — Progress Notes (Signed)
[name]  Telephone:(336) [phone] Fax:(336) [phone]  ID: ADALIZ DOBIS OB: 10-06-1950  MR#: [id number]  FUX#:[id number]  Patient Care Team: [name], MD as PCP - General (Internal Medicine) Bary Castilla Forest Gleason, MD as Consulting Physician (General Surgery)  CHIEF COMPLAINT: Clinical stage IIA ER/PR positive, HER-2 negative invasive carcinoma of the upper out quadrant of the left breast.  INTERVAL HISTORY: Patient returns to clinic today for further evaluation and reconsideration of cycle 2 of 12 of weekly Taxol.  She continues to improve and is nearly back to her baseline.  She continues to be highly anxious. She has no neurologic complaints. She has a good appetite and denies weight loss.  She has no chest pain or shortness of breath.  She denies any nausea, vomiting, constipation, or diarrhea.  She has no urinary complaints.  Patient offers no specific complaints today.  REVIEW OF SYSTEMS:   Review of Systems  Constitutional: Positive for malaise/fatigue. Negative for fever and weight loss.  Respiratory: Negative.  Negative for cough and shortness of breath.   Cardiovascular: Negative.  Negative for chest pain and leg swelling.  Gastrointestinal: Negative.  Negative for abdominal pain and constipation.  Genitourinary: Negative.  Negative for dysuria and urgency.  Musculoskeletal: Negative.  Negative for back pain.  Skin: Negative.  Negative for rash.  Neurological: Negative for dizziness, focal weakness, weakness and headaches.  Psychiatric/Behavioral: The patient is nervous/anxious.     As per HPI. Otherwise, a complete review of systems is negative.  PAST MEDICAL HISTORY: Past Medical History:  Diagnosis Date  . Anxiety   . Cancer ([place]) 07/2017   left breast  . Depression   . Hypertension   . Hypothyroidism   . Rapid heart rate   . Thyroid disease     PAST SURGICAL HISTORY: Past Surgical History:  Procedure Laterality Date  . AXILLARY  LYMPH NODE BIOPSY Left 07/16/2017   METASTATIC MAMMARY CARCINOMA  . BREAST BIOPSY Left 07/16/2017   [place] bx of left breast mass and left breast LN.  INVASIVE MAMMARY CARCINOMA, NO SPECIAL TYPE.   Marland Kitchen BREAST EXCISIONAL BIOPSY Right 2001  . BREAST LUMPECTOMY Right    2001  . COLONOSCOPY    . PORTACATH PLACEMENT Right 08/07/2017   Procedure: INSERTION PORT-A-CATH;  Surgeon: Robert Bellow, MD;  Location: [facility] ORS;  Service: General;  Laterality: Right;  . TONSILLECTOMY      FAMILY HISTORY: Family History  Problem Relation Age of Onset  . Stroke Mother   . Thyroid disease Mother   . Renal Disease Mother   . Stroke Father   . Heart attack Father   . Sudden death Father 66       suicide  . Anuerysm Brother   . Breast cancer Neg Hx     ADVANCED DIRECTIVES (Y/N):  N  HEALTH MAINTENANCE: Social History   Tobacco Use  . Smoking status: Current Some Day Smoker    Packs/day: 0.50    Years: 30.00    Pack years: 15.00    Types: Cigarettes  . Smokeless tobacco: Never Used  Substance Use Topics  . Alcohol use: Yes    Comment: occasional q 47mo  . Drug use: Never     Colonoscopy:  PAP:  Bone density:  Lipid panel:  No Known Allergies  Current Outpatient Medications  Medication Sig Dispense Refill  . ALPRAZolam (XANAX) 0.25 MG tablet Take 0.25 mg by mouth 2 (two) times daily.     .Marland KitchenamLODipine (NORVASC) 5  MG tablet Take 5 mg by mouth daily.    Marland Kitchen aspirin EC 81 MG tablet Take 81 mg by mouth daily.    Marland Kitchen levothyroxine (SYNTHROID, LEVOTHROID) 100 MCG tablet Take 100 mcg by mouth daily before breakfast.    . Potassium 99 MG TABS Take 99 mg by mouth daily.    . traZODone (DESYREL) 50 MG tablet Take 50 mg by mouth at bedtime.    Marland Kitchen acetaminophen (TYLENOL) 500 MG tablet Take 1,000 mg by mouth every 6 (six) hours as needed for fever.    Marland Kitchen acidophilus (RISAQUAD) CAPS capsule Take 1 capsule by mouth daily. (Patient not taking: Reported on 10/17/2017) 30 capsule 0  . albuterol (PROVENTIL  HFA;VENTOLIN HFA) 108 (90 Base) MCG/ACT inhaler Inhale 2 puffs into the lungs every 6 (six) hours as needed for wheezing or shortness of breath. (Patient not taking: Reported on 10/17/2017) 1 Inhaler 2  . HYDROcodone-acetaminophen (NORCO/VICODIN) 5-325 MG tablet Take 1 tablet by mouth every 4 (four) hours as needed for moderate pain. (Patient not taking: Reported on 10/17/2017) 10 tablet 0  . lidocaine-prilocaine (EMLA) cream Apply to affected area once (Patient not taking: Reported on 10/17/2017) 30 g 3  . LYSINE PO Take 1 Dose by mouth as needed (fever blister).     . ondansetron (ZOFRAN) 8 MG tablet Take 1 tablet (8 mg total) by mouth 2 (two) times daily as needed. Start on the third day after chemotherapy. (Patient not taking: Reported on 10/17/2017) 30 tablet 2  . prochlorperazine (COMPAZINE) 10 MG tablet TAKE 1 TABLET(10 MG) BY MOUTH EVERY 6 HOURS AS NEEDED FOR NAUSEA OR VOMITING (Patient not taking: No sig reported) 360 tablet 2  . valACYclovir (VALTREX) 1000 MG tablet Take 1 tablet (1,000 mg total) by mouth 2 (two) times daily. (Patient not taking: Reported on 10/17/2017) 14 tablet 0   No current facility-administered medications for this visit.     OBJECTIVE: Vitals:   10/17/17 1053  BP: 134/80  Pulse: 83  Resp: 18  Temp: (!) 95.4 F (35.2 C)     Body mass index is 33.56 kg/m.    ECOG FS:0 - Asymptomatic  General: Well-developed, well-nourished, no acute distress. Eyes: Pink conjunctiva, anicteric sclera. HEENT: Normocephalic, moist mucous membranes. Breast: Exam deferred today. Lungs: Clear to auscultation bilaterally. Heart: Regular rate and rhythm. No rubs, murmurs, or gallops. Abdomen: Soft, nontender, nondistended. No organomegaly noted, normoactive bowel sounds. Musculoskeletal: No edema, cyanosis, or clubbing. Neuro: Alert, answering all questions appropriately. Cranial nerves grossly intact. Skin: No rashes or petechiae noted. Psych: Normal affect.  LAB RESULTS:  Lab  Results  Component Value Date   NA 140 10/17/2017   K 3.7 10/17/2017   CL 108 10/17/2017   CO2 24 10/17/2017   GLUCOSE 94 10/17/2017   BUN 9 10/17/2017   CREATININE 0.73 10/17/2017   CALCIUM 9.2 10/17/2017   PROT 6.7 10/17/2017   ALBUMIN 3.8 10/17/2017   AST 26 10/17/2017   ALT 25 10/17/2017   ALKPHOS 88 10/17/2017   BILITOT 0.7 10/17/2017   GFRNONAA >60 10/17/2017   GFRAA >60 10/17/2017    Lab Results  Component Value Date   WBC 5.3 10/17/2017   NEUTROABS 3.3 10/17/2017   HGB 10.1 (L) 10/17/2017   HCT 30.1 (L) 10/17/2017   MCV 91.5 10/17/2017   PLT 358 10/17/2017     STUDIES: Dg Chest 2 View  Result Date: 10/05/2017 CLINICAL DATA:  67 y/o F; cough, fever. Patient recently started chemotherapy for breast cancer. EXAM:  CHEST - 2 VIEW COMPARISON:  09/14/2017 chest radiograph. FINDINGS: Stable normal cardiac silhouette given projection and technique. Aortic atherosclerosis with calcification. Right port catheter tip projects over lower SVC and is stable. No focal consolidation. No pleural effusion or pneumothorax. No acute osseous abnormality is evident. IMPRESSION: No acute pulmonary process identified. Electronically Signed   By: Kristine Garbe M.D.   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  PLAN:    1. 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  Case was also discussed extensively at case conference. Given the size and the stage of patient's malignancy, she will benefit from neoadjuvant chemotherapy using Adriamycin, Cytoxan, and Taxol.  Patient will likely require adjuvant XRT followed by an aromatase inhibitor for 5 years at the conclusion of her treatments.  CT scan of the chest, abdomen, pelvis completed on August 01, 2017 did not reveal  any metastatic disease.  MUGA scan on August 02, 2017 revealed EF of 61% which is adequate to proceed with treatment.  Proceed with cycle 2 of weekly Taxol.  Will dose reduce given her difficulties with cycle 1.  Return to clinic in 1 week for further evaluation and consideration of cycle 3.   2.  UTI: Resolved. 3.  Anemia: Hemoglobin continues to improve and is now 10.1. 4.  Neutropenia: Resolved.  Will dose reduce Taxol as above. 5.  Renal insufficiency: Resolved.     Patient expressed understanding and was in agreement with this plan. She also understands that She can call clinic at any time with any questions, concerns, or complaints.   Cancer Staging Breast cancer, stage 2, left ([place]) Staging form: Breast, AJCC 8th Edition - Clinical stage from 07/30/2017: Stage IIA (cT2, cN1, cM0, G2, ER+, PR+, HER2-) - Signed by Lloyd Huger, MD on 07/30/2017   Lloyd Huger, MD   10/20/2017 8:28 AM

## 2017-10-17 ENCOUNTER — Inpatient Hospital Stay (HOSPITAL_BASED_OUTPATIENT_CLINIC_OR_DEPARTMENT_OTHER): Payer: Medicare Other | Admitting: Oncology

## 2017-10-17 ENCOUNTER — Inpatient Hospital Stay: Payer: Medicare Other | Attending: Oncology

## 2017-10-17 ENCOUNTER — Other Ambulatory Visit: Payer: Self-pay

## 2017-10-17 ENCOUNTER — Inpatient Hospital Stay: Payer: Medicare Other

## 2017-10-17 VITALS — BP 134/80 | HR 83 | Temp 95.4°F | Resp 18 | Wt 207.9 lb

## 2017-10-17 DIAGNOSIS — T451X5A Adverse effect of antineoplastic and immunosuppressive drugs, initial encounter: Secondary | ICD-10-CM | POA: Insufficient documentation

## 2017-10-17 DIAGNOSIS — Z87440 Personal history of urinary (tract) infections: Secondary | ICD-10-CM | POA: Diagnosis not present

## 2017-10-17 DIAGNOSIS — R399 Unspecified symptoms and signs involving the genitourinary system: Secondary | ICD-10-CM | POA: Insufficient documentation

## 2017-10-17 DIAGNOSIS — Z23 Encounter for immunization: Secondary | ICD-10-CM | POA: Insufficient documentation

## 2017-10-17 DIAGNOSIS — I10 Essential (primary) hypertension: Secondary | ICD-10-CM | POA: Diagnosis not present

## 2017-10-17 DIAGNOSIS — Z5111 Encounter for antineoplastic chemotherapy: Secondary | ICD-10-CM | POA: Diagnosis not present

## 2017-10-17 DIAGNOSIS — F17210 Nicotine dependence, cigarettes, uncomplicated: Secondary | ICD-10-CM | POA: Diagnosis not present

## 2017-10-17 DIAGNOSIS — G620 Drug-induced polyneuropathy: Secondary | ICD-10-CM | POA: Insufficient documentation

## 2017-10-17 DIAGNOSIS — Z170 Estrogen receptor positive status [ER+]: Secondary | ICD-10-CM | POA: Insufficient documentation

## 2017-10-17 DIAGNOSIS — D649 Anemia, unspecified: Secondary | ICD-10-CM | POA: Insufficient documentation

## 2017-10-17 DIAGNOSIS — C50912 Malignant neoplasm of unspecified site of left female breast: Secondary | ICD-10-CM

## 2017-10-17 DIAGNOSIS — C50412 Malignant neoplasm of upper-outer quadrant of left female breast: Secondary | ICD-10-CM

## 2017-10-17 LAB — COMPREHENSIVE METABOLIC PANEL
ALT: 25 U/L (ref 0–44)
AST: 26 U/L (ref 15–41)
Albumin: 3.8 g/dL (ref 3.5–5.0)
Alkaline Phosphatase: 88 U/L (ref 38–126)
Anion gap: 8 (ref 5–15)
BUN: 9 mg/dL (ref 8–23)
CO2: 24 mmol/L (ref 22–32)
Calcium: 9.2 mg/dL (ref 8.9–10.3)
Chloride: 108 mmol/L (ref 98–111)
Creatinine, Ser: 0.73 mg/dL (ref 0.44–1.00)
GFR calc Af Amer: >60 mL/min (ref >60)
GFR calc non Af Amer: >60 mL/min (ref >60)
Glucose, Bld: 94 mg/dL (ref 70–99)
Potassium: 3.7 mmol/L (ref 3.5–5.1)
Sodium: 140 mmol/L (ref 135–145)
Total Bilirubin: 0.7 mg/dL (ref 0.3–1.2)
Total Protein: 6.7 g/dL (ref 6.5–8.1)

## 2017-10-17 LAB — CBC WITH DIFFERENTIAL/PLATELET
Basophils Absolute: 0.1 10*3/uL (ref 0–0.1)
Basophils Relative: 1 %
Eosinophils Absolute: 0 10*3/uL (ref 0–0.7)
Eosinophils Relative: 1 %
HCT: 30.1 % — ABNORMAL LOW (ref 35.0–47.0)
Hemoglobin: 10.1 g/dL — ABNORMAL LOW (ref 12.0–16.0)
Lymphocytes Relative: 10 %
Lymphs Abs: 0.5 10*3/uL — ABNORMAL LOW (ref 1.0–3.6)
MCH: 30.8 pg (ref 26.0–34.0)
MCHC: 33.6 g/dL (ref 32.0–36.0)
MCV: 91.5 fL (ref 80.0–100.0)
Monocytes Absolute: 1.3 10*3/uL — ABNORMAL HIGH (ref 0.2–0.9)
Monocytes Relative: 25 %
Neutro Abs: 3.3 10*3/uL (ref 1.4–6.5)
Neutrophils Relative %: 63 %
Platelets: 358 10*3/uL (ref 150–440)
RBC: 3.29 MIL/uL — ABNORMAL LOW (ref 3.80–5.20)
RDW: 21.1 % — ABNORMAL HIGH (ref 11.5–14.5)
WBC: 5.3 10*3/uL (ref 3.6–11.0)

## 2017-10-17 LAB — SAMPLE TO BLOOD BANK

## 2017-10-17 MED ORDER — HEPARIN SOD (PORK) LOCK FLUSH 100 UNIT/ML IV SOLN
500.0000 [IU] | Freq: Once | INTRAVENOUS | Status: AC
  Administered 2017-10-17: 500 [IU] via INTRAVENOUS
  Filled 2017-10-17: qty 5

## 2017-10-17 MED ORDER — SODIUM CHLORIDE 0.9 % IV SOLN
Freq: Once | INTRAVENOUS | Status: AC
  Administered 2017-10-17: 12:00:00 via INTRAVENOUS
  Filled 2017-10-17: qty 250

## 2017-10-17 MED ORDER — SODIUM CHLORIDE 0.9% FLUSH
10.0000 mL | Freq: Once | INTRAVENOUS | Status: AC
  Administered 2017-10-17: 10 mL via INTRAVENOUS
  Filled 2017-10-17: qty 10

## 2017-10-17 MED ORDER — DIPHENHYDRAMINE HCL 50 MG/ML IJ SOLN
25.0000 mg | Freq: Once | INTRAMUSCULAR | Status: AC
  Administered 2017-10-17: 25 mg via INTRAVENOUS
  Filled 2017-10-17: qty 1

## 2017-10-17 MED ORDER — FAMOTIDINE IN NACL 20-0.9 MG/50ML-% IV SOLN
20.0000 mg | Freq: Once | INTRAVENOUS | Status: AC
  Administered 2017-10-17: 20 mg via INTRAVENOUS
  Filled 2017-10-17: qty 50

## 2017-10-17 MED ORDER — DEXAMETHASONE SODIUM PHOSPHATE 10 MG/ML IJ SOLN
10.0000 mg | Freq: Once | INTRAMUSCULAR | Status: AC
  Administered 2017-10-17: 10 mg via INTRAVENOUS
  Filled 2017-10-17: qty 1

## 2017-10-17 MED ORDER — SODIUM CHLORIDE 0.9 % IV SOLN
72.0000 mg/m2 | Freq: Once | INTRAVENOUS | Status: AC
  Administered 2017-10-17: 156 mg via INTRAVENOUS
  Filled 2017-10-17: qty 26

## 2017-10-17 MED ORDER — SODIUM CHLORIDE 0.9 % IV SOLN: 10.0000 mg | Freq: Once | INTRAVENOUS | Status: DC

## 2017-10-17 NOTE — Progress Notes (Signed)
Here fpr follow up.  " Im almost feeling human again after a week off of chemo"per pt

## 2017-10-21 DIAGNOSIS — R002 Palpitations: Secondary | ICD-10-CM | POA: Diagnosis not present

## 2017-10-21 DIAGNOSIS — R011 Cardiac murmur, unspecified: Secondary | ICD-10-CM | POA: Diagnosis not present

## 2017-10-21 DIAGNOSIS — I4891 Unspecified atrial fibrillation: Secondary | ICD-10-CM | POA: Diagnosis not present

## 2017-10-21 DIAGNOSIS — F410 Panic disorder [episodic paroxysmal anxiety] without agoraphobia: Secondary | ICD-10-CM | POA: Diagnosis not present

## 2017-10-21 NOTE — Progress Notes (Signed)
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  INTERVAL HISTORY: Patient returns to clinic today for further evaluation and consideration of cycle 3 of weekly dose reduced Taxol.  She continues to have increased weakness and fatigue, but otherwise feels well. She continues to be highly anxious. She has no neurologic complaints. She has a good appetite and denies weight loss.  She has no chest pain or shortness of breath.  She denies any nausea, vomiting, constipation, or diarrhea.  She has no urinary complaints.  Patient offers no further specific complaints today.  REVIEW OF SYSTEMS:   Review of Systems  Constitutional: Positive for malaise/fatigue. Negative for fever and weight loss.  Respiratory: Negative.  Negative for cough and shortness of breath.   Cardiovascular: Negative.  Negative for chest pain and leg swelling.  Gastrointestinal: Negative.  Negative for abdominal pain and constipation.  Genitourinary: Negative.  Negative for dysuria and urgency.  Musculoskeletal: Negative.  Negative for back pain.  Skin: Negative.  Negative for rash.  Neurological: Positive for weakness. Negative for dizziness, focal weakness and headaches.  Psychiatric/Behavioral: The patient is nervous/anxious.     As per HPI. Otherwise, a complete review of systems is negative.  PAST MEDICAL HISTORY: Past Medical History:  Diagnosis Date  . Anxiety   . Cancer ([place]) 07/2017   left breast  . Depression   . Hypertension   . Hypothyroidism   . Rapid heart rate   . Thyroid disease     PAST SURGICAL HISTORY: Past Surgical History:    Procedure Laterality Date  . AXILLARY LYMPH NODE BIOPSY Left 07/16/2017   METASTATIC MAMMARY CARCINOMA  . BREAST BIOPSY Left 07/16/2017   [place] bx of left breast mass and left breast LN.  INVASIVE MAMMARY CARCINOMA, NO SPECIAL TYPE.   Marland Kitchen BREAST EXCISIONAL BIOPSY Right 2001  . BREAST LUMPECTOMY Right    2001  . COLONOSCOPY    . PORTACATH PLACEMENT Right 08/07/2017   Procedure: INSERTION PORT-A-CATH;  Surgeon: Robert Bellow, MD;  Location: [facility] ORS;  Service: General;  Laterality: Right;  . TONSILLECTOMY      FAMILY HISTORY: Family History  Problem Relation Age of Onset  . Stroke Mother   . Thyroid disease Mother   . Renal Disease Mother   . Stroke Father   . Heart attack Father   . Sudden death Father 48       suicide  . Anuerysm Brother   . Breast cancer Neg Hx     ADVANCED DIRECTIVES (Y/N):  N  HEALTH MAINTENANCE: Social History   Tobacco Use  . Smoking status: Current Some Day Smoker    Packs/day: 0.50    Years: 30.00    Pack years: 15.00    Types: Cigarettes  . Smokeless tobacco: Never Used  Substance Use Topics  . Alcohol use: Yes    Comment: occasional q 71mo  . Drug use: Never     Colonoscopy:  PAP:  Bone density:  Lipid panel:  No Known Allergies  Current Outpatient Medications  Medication Sig Dispense Refill  . acetaminophen (TYLENOL) 500 MG tablet Take 1,000 mg by mouth every 6 (six) hours as needed for fever.    .Marland Kitchen  acidophilus (RISAQUAD) CAPS capsule Take 1 capsule by mouth daily. 30 capsule 0  . ALPRAZolam (XANAX) 0.25 MG tablet Take 0.25 mg by mouth 2 (two) times daily.     Marland Kitchen amLODipine (NORVASC) 5 MG tablet Take 5 mg by mouth daily.    Marland Kitchen aspirin EC 81 MG tablet Take 81 mg by mouth daily.    Marland Kitchen levothyroxine (SYNTHROID, LEVOTHROID) 100 MCG tablet Take 100 mcg by mouth daily before breakfast.    . lidocaine-prilocaine (EMLA) cream Apply to affected area once 30 g 3  . LYSINE PO Take 1 Dose by mouth as needed (fever blister).     . Potassium 99  MG TABS Take 99 mg by mouth daily.    . traZODone (DESYREL) 50 MG tablet Take 50 mg by mouth at bedtime.    . valACYclovir (VALTREX) 1000 MG tablet Take 1 tablet (1,000 mg total) by mouth 2 (two) times daily. 14 tablet 0  . albuterol (PROVENTIL HFA;VENTOLIN HFA) 108 (90 Base) MCG/ACT inhaler Inhale 2 puffs into the lungs every 6 (six) hours as needed for wheezing or shortness of breath. (Patient not taking: Reported on 10/24/2017) 1 Inhaler 2  . HYDROcodone-acetaminophen (NORCO/VICODIN) 5-325 MG tablet Take 1 tablet by mouth every 4 (four) hours as needed for moderate pain. (Patient not taking: Reported on 10/24/2017) 10 tablet 0  . ondansetron (ZOFRAN) 8 MG tablet Take 1 tablet (8 mg total) by mouth 2 (two) times daily as needed. Start on the third day after chemotherapy. (Patient not taking: Reported on 10/24/2017) 30 tablet 2  . prochlorperazine (COMPAZINE) 10 MG tablet TAKE 1 TABLET(10 MG) BY MOUTH EVERY 6 HOURS AS NEEDED FOR NAUSEA OR VOMITING (Patient not taking: Reported on 10/24/2017) 360 tablet 2   No current facility-administered medications for this visit.     OBJECTIVE: Vitals:   10/24/17 0929  BP: (!) 150/82  Pulse: 82  Resp: 18  Temp: (!) 94.2 F (34.6 C)     Body mass index is 33.77 kg/m.    ECOG FS:0 - Asymptomatic  General: Well-developed, well-nourished, no acute distress. Eyes: Pink conjunctiva, anicteric sclera. HEENT: Normocephalic, moist mucous membranes. Breast: Exam deferred today. Lungs: Clear to auscultation bilaterally. Heart: Regular rate and rhythm. No rubs, murmurs, or gallops. Abdomen: Soft, nontender, nondistended. No organomegaly noted, normoactive bowel sounds. Musculoskeletal: No edema, cyanosis, or clubbing. Neuro: Alert, answering all questions appropriately. Cranial nerves grossly intact. Skin: No rashes or petechiae noted. Psych: Normal affect.  LAB RESULTS:  Lab Results  Component Value Date   NA 138 10/24/2017   K 3.8 10/24/2017   CL  107 10/24/2017   CO2 22 10/24/2017   GLUCOSE 99 10/24/2017   BUN 13 10/24/2017   CREATININE 0.67 10/24/2017   CALCIUM 9.3 10/24/2017   PROT 6.6 10/24/2017   ALBUMIN 4.0 10/24/2017   AST 24 10/24/2017   ALT 24 10/24/2017   ALKPHOS 91 10/24/2017   BILITOT 0.4 10/24/2017   GFRNONAA >60 10/24/2017   GFRAA >60 10/24/2017    Lab Results  Component Value Date   WBC 5.1 10/24/2017   NEUTROABS 3.5 10/24/2017   HGB 9.2 (L) 10/24/2017   HCT 29.1 (L) 10/24/2017   MCV 94.5 10/24/2017   PLT 258 10/24/2017     STUDIES: Dg Chest 2 View  Result Date: 10/05/2017 CLINICAL DATA:  67 y/o F; cough, fever. Patient recently started chemotherapy for breast cancer. EXAM: CHEST - 2 VIEW COMPARISON:  09/14/2017 chest radiograph. FINDINGS: Stable normal cardiac silhouette given projection and  technique. Aortic atherosclerosis with calcification. Right port catheter tip projects over lower SVC and is stable. No focal consolidation. No pleural effusion or pneumothorax. No acute osseous abnormality is evident. IMPRESSION: No acute pulmonary process identified. Electronically Signed   By: Kristine Garbe M.D.   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  PLAN:    1. 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  Case was also discussed extensively at case conference. Given the size and the stage of patient's malignancy, she will benefit from neoadjuvant chemotherapy using Adriamycin, Cytoxan, and Taxol.  Patient will likely require adjuvant XRT followed by an aromatase inhibitor for 5 years at the conclusion of her treatments.  CT scan of the chest, abdomen, pelvis completed on August 01, 2017 did not reveal any metastatic disease.  MUGA scan on August 02, 2017 revealed EF of 61% which is  adequate to proceed with treatment.  Proceed with cycle 3 of weekly dose reduced Taxol today.  Return to clinic in 1 week for further evaluation and consideration of cycle 4.   2.  UTI: Resolved. 3.  Anemia: Hemoglobin has trended down slightly and is now 9.2.  Monitor.  I spent a total of 30 minutes face-to-face with the patient of which greater than 50% of the visit was spent in counseling and coordination of care as detailed above.   Patient expressed understanding and was in agreement with this plan. She also understands that She can call clinic at any time with any questions, concerns, or complaints.   Cancer Staging Breast cancer, stage 2, left ([place]) Staging form: Breast, AJCC 8th Edition - Clinical stage from 07/30/2017: Stage IIA (cT2, cN1, cM0, G2, ER+, PR+, HER2-) - Signed by Lloyd Huger, MD on 07/30/2017   Lloyd Huger, MD   10/25/2017 3:35 PM

## 2017-10-24 ENCOUNTER — Other Ambulatory Visit: Payer: Self-pay

## 2017-10-24 ENCOUNTER — Inpatient Hospital Stay: Payer: Medicare Other

## 2017-10-24 ENCOUNTER — Inpatient Hospital Stay (HOSPITAL_BASED_OUTPATIENT_CLINIC_OR_DEPARTMENT_OTHER): Payer: Medicare Other | Admitting: Oncology

## 2017-10-24 VITALS — BP 150/82 | HR 82 | Temp 94.2°F | Resp 18 | Wt 209.2 lb

## 2017-10-24 DIAGNOSIS — D649 Anemia, unspecified: Secondary | ICD-10-CM | POA: Diagnosis not present

## 2017-10-24 DIAGNOSIS — I10 Essential (primary) hypertension: Secondary | ICD-10-CM | POA: Diagnosis not present

## 2017-10-24 DIAGNOSIS — G620 Drug-induced polyneuropathy: Secondary | ICD-10-CM | POA: Diagnosis not present

## 2017-10-24 DIAGNOSIS — F17210 Nicotine dependence, cigarettes, uncomplicated: Secondary | ICD-10-CM | POA: Diagnosis not present

## 2017-10-24 DIAGNOSIS — Z170 Estrogen receptor positive status [ER+]: Secondary | ICD-10-CM | POA: Diagnosis not present

## 2017-10-24 DIAGNOSIS — C50912 Malignant neoplasm of unspecified site of left female breast: Secondary | ICD-10-CM

## 2017-10-24 DIAGNOSIS — Z23 Encounter for immunization: Secondary | ICD-10-CM | POA: Diagnosis not present

## 2017-10-24 DIAGNOSIS — C50412 Malignant neoplasm of upper-outer quadrant of left female breast: Secondary | ICD-10-CM

## 2017-10-24 DIAGNOSIS — Z5111 Encounter for antineoplastic chemotherapy: Secondary | ICD-10-CM | POA: Diagnosis not present

## 2017-10-24 LAB — COMPREHENSIVE METABOLIC PANEL
ALT: 24 U/L (ref 0–44)
AST: 24 U/L (ref 15–41)
Albumin: 4 g/dL (ref 3.5–5.0)
Alkaline Phosphatase: 91 U/L (ref 38–126)
Anion gap: 9 (ref 5–15)
BUN: 13 mg/dL (ref 8–23)
CO2: 22 mmol/L (ref 22–32)
Calcium: 9.3 mg/dL (ref 8.9–10.3)
Chloride: 107 mmol/L (ref 98–111)
Creatinine, Ser: 0.67 mg/dL (ref 0.44–1.00)
GFR calc Af Amer: >60 mL/min (ref >60)
GFR calc non Af Amer: >60 mL/min (ref >60)
Glucose, Bld: 99 mg/dL (ref 70–99)
Potassium: 3.8 mmol/L (ref 3.5–5.1)
Sodium: 138 mmol/L (ref 135–145)
Total Bilirubin: 0.4 mg/dL (ref 0.3–1.2)
Total Protein: 6.6 g/dL (ref 6.5–8.1)

## 2017-10-24 LAB — CBC WITH DIFFERENTIAL/PLATELET
Abs Immature Granulocytes: 0.13 10*3/uL — ABNORMAL HIGH (ref 0.00–0.07)
Basophils Absolute: 0.1 10*3/uL (ref 0.0–0.1)
Basophils Relative: 2 %
Eosinophils Absolute: 0.2 10*3/uL (ref 0.0–0.5)
Eosinophils Relative: 4 %
HCT: 29.1 % — ABNORMAL LOW (ref 36.0–46.0)
Hemoglobin: 9.2 g/dL — ABNORMAL LOW (ref 12.0–15.0)
Immature Granulocytes: 3 %
Lymphocytes Relative: 14 %
Lymphs Abs: 0.7 10*3/uL (ref 0.7–4.0)
MCH: 29.9 pg (ref 26.0–34.0)
MCHC: 31.6 g/dL (ref 30.0–36.0)
MCV: 94.5 fL (ref 80.0–100.0)
Monocytes Absolute: 0.5 10*3/uL (ref 0.1–1.0)
Monocytes Relative: 10 %
Neutro Abs: 3.5 10*3/uL (ref 1.7–7.7)
Neutrophils Relative %: 67 %
Platelets: 258 10*3/uL (ref 150–400)
RBC: 3.08 MIL/uL — ABNORMAL LOW (ref 3.87–5.11)
RDW: 18.7 % — ABNORMAL HIGH (ref 11.5–15.5)
WBC: 5.1 10*3/uL (ref 4.0–10.5)
nRBC: 0 % (ref 0.0–0.2)

## 2017-10-24 LAB — SAMPLE TO BLOOD BANK

## 2017-10-24 MED ORDER — SODIUM CHLORIDE 0.9 % IV SOLN
Freq: Once | INTRAVENOUS | Status: AC
  Administered 2017-10-24: 10:00:00 via INTRAVENOUS
  Filled 2017-10-24: qty 250

## 2017-10-24 MED ORDER — DIPHENHYDRAMINE HCL 50 MG/ML IJ SOLN
25.0000 mg | Freq: Once | INTRAMUSCULAR | Status: AC
  Administered 2017-10-24: 25 mg via INTRAVENOUS
  Filled 2017-10-24: qty 1

## 2017-10-24 MED ORDER — SODIUM CHLORIDE 0.9% FLUSH
10.0000 mL | INTRAVENOUS | Status: DC | PRN
  Administered 2017-10-24: 10 mL via INTRAVENOUS
  Filled 2017-10-24: qty 10

## 2017-10-24 MED ORDER — FAMOTIDINE IN NACL 20-0.9 MG/50ML-% IV SOLN
20.0000 mg | Freq: Once | INTRAVENOUS | Status: AC
  Administered 2017-10-24: 20 mg via INTRAVENOUS
  Filled 2017-10-24: qty 50

## 2017-10-24 MED ORDER — HEPARIN SOD (PORK) LOCK FLUSH 100 UNIT/ML IV SOLN
500.0000 [IU] | Freq: Once | INTRAVENOUS | Status: AC
  Administered 2017-10-24: 500 [IU] via INTRAVENOUS
  Filled 2017-10-24: qty 5

## 2017-10-24 MED ORDER — DEXAMETHASONE SODIUM PHOSPHATE 10 MG/ML IJ SOLN
10.0000 mg | Freq: Once | INTRAMUSCULAR | Status: AC
  Administered 2017-10-24: 10 mg via INTRAVENOUS
  Filled 2017-10-24: qty 1

## 2017-10-24 MED ORDER — SODIUM CHLORIDE 0.9 % IV SOLN
72.0000 mg/m2 | Freq: Once | INTRAVENOUS | Status: AC
  Administered 2017-10-24: 156 mg via INTRAVENOUS
  Filled 2017-10-24: qty 26

## 2017-10-24 NOTE — Progress Notes (Signed)
Here for follow up. Today stated she feels " pretty good "  Stated she feels " tingling in the tip of fingers "

## 2017-10-27 NOTE — Progress Notes (Signed)
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  INTERVAL HISTORY: Patient returns to clinic today for further evaluation and consideration of cycle 4 of dose reduced Taxol.  She has noticed a mild peripheral neuropathy in her fingertips, but otherwise is tolerating her treatments well.  She continues to have chronic weakness and fatigue. She continues to be highly anxious. She has no other neurologic complaints. She has a good appetite and denies weight loss. She has no chest pain or shortness of breath.  She denies any nausea, vomiting, constipation, or diarrhea.  She has no urinary complaints.  Patient offers no further specific complaints today.  REVIEW OF SYSTEMS:   Review of Systems  Constitutional: Positive for malaise/fatigue. Negative for fever and weight loss.  Respiratory: Negative.  Negative for cough and shortness of breath.   Cardiovascular: Negative.  Negative for chest pain and leg swelling.  Gastrointestinal: Negative.  Negative for abdominal pain and constipation.  Genitourinary: Negative.  Negative for dysuria and urgency.  Musculoskeletal: Negative.  Negative for back pain.  Skin: Negative.  Negative for rash.  Neurological: Positive for tingling, sensory change and weakness. Negative for dizziness, focal weakness and headaches.  Psychiatric/Behavioral: The patient is nervous/anxious.     As per HPI. Otherwise, a complete review of systems is negative.  PAST MEDICAL HISTORY: Past Medical History:  Diagnosis Date  . Anxiety   . Cancer ([place]) 07/2017   left breast  . Depression   . Hypertension   .  Hypothyroidism   . Rapid heart rate   . Thyroid disease     PAST SURGICAL HISTORY: Past Surgical History:  Procedure Laterality Date  . AXILLARY LYMPH NODE BIOPSY Left 07/16/2017   METASTATIC MAMMARY CARCINOMA  . BREAST BIOPSY Left 07/16/2017   [place] bx of left breast mass and left breast LN.  INVASIVE MAMMARY CARCINOMA, NO SPECIAL TYPE.   Marland Kitchen BREAST EXCISIONAL BIOPSY Right 2001  . BREAST LUMPECTOMY Right    2001  . COLONOSCOPY    . PORTACATH PLACEMENT Right 08/07/2017   Procedure: INSERTION PORT-A-CATH;  Surgeon: Robert Bellow, MD;  Location: [facility] ORS;  Service: General;  Laterality: Right;  . TONSILLECTOMY      FAMILY HISTORY: Family History  Problem Relation Age of Onset  . Stroke Mother   . Thyroid disease Mother   . Renal Disease Mother   . Stroke Father   . Heart attack Father   . Sudden death Father 66       suicide  . Anuerysm Brother   . Breast cancer Neg Hx     ADVANCED DIRECTIVES (Y/N):  N  HEALTH MAINTENANCE: Social History   Tobacco Use  . Smoking status: Current Some Day Smoker    Packs/day: 0.50    Years: 30.00    Pack years: 15.00    Types: Cigarettes  . Smokeless tobacco: Never Used  Substance Use Topics  . Alcohol use: Yes    Comment: occasional q 67mo  . Drug use: Never     Colonoscopy:  PAP:  Bone density:  Lipid panel:  No Known Allergies  Current Outpatient Medications  Medication Sig Dispense Refill  . acetaminophen (TYLENOL)  500 MG tablet Take 1,000 mg by mouth every 6 (six) hours as needed for fever.    Marland Kitchen acidophilus (RISAQUAD) CAPS capsule Take 1 capsule by mouth daily. 30 capsule 0  . albuterol (PROVENTIL HFA;VENTOLIN HFA) 108 (90 Base) MCG/ACT inhaler Inhale 2 puffs into the lungs every 6 (six) hours as needed for wheezing or shortness of breath. 1 Inhaler 2  . ALPRAZolam (XANAX) 0.25 MG tablet Take 0.25 mg by mouth 2 (two) times daily.     Marland Kitchen amLODipine (NORVASC) 5 MG tablet Take 5 mg by mouth daily.    Marland Kitchen aspirin EC 81 MG  tablet Take 81 mg by mouth daily.    Marland Kitchen HYDROcodone-acetaminophen (NORCO/VICODIN) 5-325 MG tablet Take 1 tablet by mouth every 4 (four) hours as needed for moderate pain. 10 tablet 0  . levothyroxine (SYNTHROID, LEVOTHROID) 100 MCG tablet Take 100 mcg by mouth daily before breakfast.    . lidocaine-prilocaine (EMLA) cream Apply to affected area once 30 g 3  . LYSINE PO Take 1 Dose by mouth as needed (fever blister).     . ondansetron (ZOFRAN) 8 MG tablet Take 1 tablet (8 mg total) by mouth 2 (two) times daily as needed. Start on the third day after chemotherapy. 30 tablet 2  . Potassium 99 MG TABS Take 99 mg by mouth daily.    . prochlorperazine (COMPAZINE) 10 MG tablet TAKE 1 TABLET(10 MG) BY MOUTH EVERY 6 HOURS AS NEEDED FOR NAUSEA OR VOMITING 360 tablet 2  . traZODone (DESYREL) 50 MG tablet Take 50 mg by mouth at bedtime.    . valACYclovir (VALTREX) 1000 MG tablet Take 1 tablet (1,000 mg total) by mouth 2 (two) times daily. 14 tablet 0   No current facility-administered medications for this visit.     OBJECTIVE: Vitals:   10/31/17 0900 10/31/17 0903  BP:  112/73  Pulse:  73  Resp: 16   Temp:  98.6 F (37 C)     Body mass index is 33.83 kg/m.    ECOG FS:0 - Asymptomatic  General: Well-developed, well-nourished, no acute distress. Eyes: Pink conjunctiva, anicteric sclera. HEENT: Normocephalic, moist mucous membranes. Breast: Exam deferred today. Lungs: Clear to auscultation bilaterally. Heart: Regular rate and rhythm. No rubs, murmurs, or gallops. Abdomen: Soft, nontender, nondistended. No organomegaly noted, normoactive bowel sounds. Musculoskeletal: No edema, cyanosis, or clubbing. Neuro: Alert, answering all questions appropriately. Cranial nerves grossly intact. Skin: No rashes or petechiae noted. Psych: Normal affect.  LAB RESULTS:  Lab Results  Component Value Date   NA 139 10/31/2017   K 3.8 10/31/2017   CL 109 10/31/2017   CO2 23 10/31/2017   GLUCOSE 96 10/31/2017    BUN 14 10/31/2017   CREATININE 0.74 10/31/2017   CALCIUM 9.4 10/31/2017   PROT 6.6 10/31/2017   ALBUMIN 4.0 10/31/2017   AST 33 10/31/2017   ALT 36 10/31/2017   ALKPHOS 96 10/31/2017   BILITOT 0.6 10/31/2017   GFRNONAA >60 10/31/2017   GFRAA >60 10/31/2017    Lab Results  Component Value Date   WBC 3.6 (L) 10/31/2017   NEUTROABS 2.7 10/31/2017   HGB 9.3 (L) 10/31/2017   HCT 28.9 (L) 10/31/2017   MCV 94.8 10/31/2017   PLT 232 10/31/2017     STUDIES: Dg Chest 2 View  Result Date: 10/05/2017 CLINICAL DATA:  66 y/o F; cough, fever. Patient recently started chemotherapy for breast cancer. EXAM: CHEST - 2 VIEW COMPARISON:  09/14/2017 chest radiograph. FINDINGS: Stable normal cardiac silhouette given projection  and technique. Aortic atherosclerosis with calcification. Right port catheter tip projects over lower SVC and is stable. No focal consolidation. No pleural effusion or pneumothorax. No acute osseous abnormality is evident. IMPRESSION: No acute pulmonary process identified. Electronically Signed   By: Kristine Garbe M.D.   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  PLAN:    1. 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  Case was also discussed extensively at case conference. Given the size and the stage of patient's malignancy, she will benefit from neoadjuvant chemotherapy using Adriamycin, Cytoxan, and Taxol.  Patient will likely require adjuvant XRT followed by an aromatase inhibitor for 5 years at the conclusion of her treatments.  CT scan of the chest, abdomen, pelvis completed on August 01, 2017 did not reveal any metastatic disease.  MUGA scan on August 02, 2017 revealed EF of 61% which is adequate to proceed with treatment.  Proceed with cycle  4 of dose reduced weekly Taxol today.  Return to clinic in 1 week for laboratory work and treatment only and then in 2 weeks for further evaluation and consideration of cycle 6.  If patient's peripheral neuropathy persists, will consider dose reducing once again.   2.  Anemia: Patient's hemoglobin is decreased to 9.3, but stable. 3.  Peripheral neuropathy: Mild.  Continue with dose reduce Taxol as above.  Consider second dose reduction if it persists.  Patient expressed understanding and was in agreement with this plan. She also understands that She can call clinic at any time with any questions, concerns, or complaints.   Cancer Staging Breast cancer, stage 2, left ([place]) Staging form: Breast, AJCC 8th Edition - Clinical stage from 07/30/2017: Stage IIA (cT2, cN1, cM0, G2, ER+, PR+, HER2-) - Signed by Lloyd Huger, MD on 07/30/2017   Lloyd Huger, MD   11/01/2017 2:15 PM

## 2017-10-31 ENCOUNTER — Inpatient Hospital Stay (HOSPITAL_BASED_OUTPATIENT_CLINIC_OR_DEPARTMENT_OTHER): Payer: Medicare Other | Admitting: Oncology

## 2017-10-31 ENCOUNTER — Other Ambulatory Visit: Payer: Self-pay

## 2017-10-31 ENCOUNTER — Encounter: Payer: Self-pay | Admitting: Oncology

## 2017-10-31 ENCOUNTER — Inpatient Hospital Stay: Payer: Medicare Other

## 2017-10-31 VITALS — BP 112/73 | HR 73 | Temp 98.6°F | Resp 16 | Ht 66.0 in | Wt 209.6 lb

## 2017-10-31 DIAGNOSIS — G620 Drug-induced polyneuropathy: Secondary | ICD-10-CM

## 2017-10-31 DIAGNOSIS — Z5111 Encounter for antineoplastic chemotherapy: Secondary | ICD-10-CM | POA: Diagnosis not present

## 2017-10-31 DIAGNOSIS — C50912 Malignant neoplasm of unspecified site of left female breast: Secondary | ICD-10-CM

## 2017-10-31 DIAGNOSIS — T451X5A Adverse effect of antineoplastic and immunosuppressive drugs, initial encounter: Secondary | ICD-10-CM | POA: Diagnosis not present

## 2017-10-31 DIAGNOSIS — D649 Anemia, unspecified: Secondary | ICD-10-CM

## 2017-10-31 DIAGNOSIS — F17210 Nicotine dependence, cigarettes, uncomplicated: Secondary | ICD-10-CM

## 2017-10-31 DIAGNOSIS — Z23 Encounter for immunization: Secondary | ICD-10-CM | POA: Diagnosis not present

## 2017-10-31 DIAGNOSIS — C50412 Malignant neoplasm of upper-outer quadrant of left female breast: Secondary | ICD-10-CM

## 2017-10-31 LAB — COMPREHENSIVE METABOLIC PANEL
ALT: 36 U/L (ref 0–44)
AST: 33 U/L (ref 15–41)
Albumin: 4 g/dL (ref 3.5–5.0)
Alkaline Phosphatase: 96 U/L (ref 38–126)
Anion gap: 7 (ref 5–15)
BUN: 14 mg/dL (ref 8–23)
CO2: 23 mmol/L (ref 22–32)
Calcium: 9.4 mg/dL (ref 8.9–10.3)
Chloride: 109 mmol/L (ref 98–111)
Creatinine, Ser: 0.74 mg/dL (ref 0.44–1.00)
GFR calc Af Amer: >60 mL/min (ref >60)
GFR calc non Af Amer: >60 mL/min (ref >60)
Glucose, Bld: 96 mg/dL (ref 70–99)
Potassium: 3.8 mmol/L (ref 3.5–5.1)
Sodium: 139 mmol/L (ref 135–145)
Total Bilirubin: 0.6 mg/dL (ref 0.3–1.2)
Total Protein: 6.6 g/dL (ref 6.5–8.1)

## 2017-10-31 LAB — SAMPLE TO BLOOD BANK

## 2017-10-31 LAB — CBC WITH DIFFERENTIAL/PLATELET
Abs Immature Granulocytes: 0.05 10*3/uL (ref 0.00–0.07)
Basophils Absolute: 0 10*3/uL (ref 0.0–0.1)
Basophils Relative: 1 %
Eosinophils Absolute: 0.1 10*3/uL (ref 0.0–0.5)
Eosinophils Relative: 3 %
HCT: 28.9 % — ABNORMAL LOW (ref 36.0–46.0)
Hemoglobin: 9.3 g/dL — ABNORMAL LOW (ref 12.0–15.0)
Immature Granulocytes: 1 %
Lymphocytes Relative: 14 %
Lymphs Abs: 0.5 10*3/uL — ABNORMAL LOW (ref 0.7–4.0)
MCH: 30.5 pg (ref 26.0–34.0)
MCHC: 32.2 g/dL (ref 30.0–36.0)
MCV: 94.8 fL (ref 80.0–100.0)
Monocytes Absolute: 0.2 10*3/uL (ref 0.1–1.0)
Monocytes Relative: 6 %
Neutro Abs: 2.7 10*3/uL (ref 1.7–7.7)
Neutrophils Relative %: 75 %
Platelets: 232 10*3/uL (ref 150–400)
RBC: 3.05 MIL/uL — ABNORMAL LOW (ref 3.87–5.11)
RDW: 18.2 % — ABNORMAL HIGH (ref 11.5–15.5)
WBC: 3.6 10*3/uL — ABNORMAL LOW (ref 4.0–10.5)
nRBC: 0 % (ref 0.0–0.2)

## 2017-10-31 MED ORDER — SODIUM CHLORIDE 0.9 % IV SOLN
Freq: Once | INTRAVENOUS | Status: AC
  Administered 2017-10-31: 10:00:00 via INTRAVENOUS
  Filled 2017-10-31: qty 250

## 2017-10-31 MED ORDER — FAMOTIDINE IN NACL 20-0.9 MG/50ML-% IV SOLN
20.0000 mg | Freq: Once | INTRAVENOUS | Status: AC
  Administered 2017-10-31: 20 mg via INTRAVENOUS
  Filled 2017-10-31: qty 50

## 2017-10-31 MED ORDER — SODIUM CHLORIDE 0.9 % IV SOLN
72.0000 mg/m2 | Freq: Once | INTRAVENOUS | Status: AC
  Administered 2017-10-31: 156 mg via INTRAVENOUS
  Filled 2017-10-31: qty 26

## 2017-10-31 MED ORDER — DIPHENHYDRAMINE HCL 50 MG/ML IJ SOLN
25.0000 mg | Freq: Once | INTRAMUSCULAR | Status: AC
  Administered 2017-10-31: 25 mg via INTRAVENOUS
  Filled 2017-10-31: qty 1

## 2017-10-31 MED ORDER — DEXAMETHASONE SODIUM PHOSPHATE 10 MG/ML IJ SOLN
10.0000 mg | Freq: Once | INTRAMUSCULAR | Status: AC
  Administered 2017-10-31: 10 mg via INTRAVENOUS
  Filled 2017-10-31: qty 1

## 2017-10-31 MED ORDER — HEPARIN SOD (PORK) LOCK FLUSH 100 UNIT/ML IV SOLN
500.0000 [IU] | Freq: Once | INTRAVENOUS | Status: AC | PRN
  Administered 2017-10-31: 500 [IU]
  Filled 2017-10-31: qty 5

## 2017-10-31 MED ORDER — INFLUENZA VAC SPLIT HIGH-DOSE 0.5 ML IM SUSY
0.5000 mL | PREFILLED_SYRINGE | Freq: Once | INTRAMUSCULAR | Status: AC
  Administered 2017-10-31: 0.5 mL via INTRAMUSCULAR
  Filled 2017-10-31: qty 0.5

## 2017-10-31 NOTE — Progress Notes (Signed)
Patient here for treatment her neuropathy is worse this week.

## 2017-11-05 DIAGNOSIS — F410 Panic disorder [episodic paroxysmal anxiety] without agoraphobia: Secondary | ICD-10-CM | POA: Diagnosis not present

## 2017-11-05 DIAGNOSIS — I4891 Unspecified atrial fibrillation: Secondary | ICD-10-CM | POA: Diagnosis not present

## 2017-11-05 DIAGNOSIS — R002 Palpitations: Secondary | ICD-10-CM | POA: Diagnosis not present

## 2017-11-05 DIAGNOSIS — R011 Cardiac murmur, unspecified: Secondary | ICD-10-CM | POA: Diagnosis not present

## 2017-11-07 ENCOUNTER — Inpatient Hospital Stay: Payer: Medicare Other

## 2017-11-07 ENCOUNTER — Other Ambulatory Visit: Payer: Self-pay | Admitting: *Deleted

## 2017-11-07 DIAGNOSIS — D649 Anemia, unspecified: Secondary | ICD-10-CM

## 2017-11-07 DIAGNOSIS — G620 Drug-induced polyneuropathy: Secondary | ICD-10-CM | POA: Diagnosis not present

## 2017-11-07 DIAGNOSIS — C50412 Malignant neoplasm of upper-outer quadrant of left female breast: Secondary | ICD-10-CM | POA: Diagnosis not present

## 2017-11-07 DIAGNOSIS — Z23 Encounter for immunization: Secondary | ICD-10-CM | POA: Diagnosis not present

## 2017-11-07 DIAGNOSIS — R399 Unspecified symptoms and signs involving the genitourinary system: Secondary | ICD-10-CM

## 2017-11-07 DIAGNOSIS — F17210 Nicotine dependence, cigarettes, uncomplicated: Secondary | ICD-10-CM | POA: Diagnosis not present

## 2017-11-07 DIAGNOSIS — Z5111 Encounter for antineoplastic chemotherapy: Secondary | ICD-10-CM | POA: Diagnosis not present

## 2017-11-07 DIAGNOSIS — C50912 Malignant neoplasm of unspecified site of left female breast: Secondary | ICD-10-CM

## 2017-11-07 LAB — CBC WITH DIFFERENTIAL/PLATELET
Abs Immature Granulocytes: 0.07 10*3/uL (ref 0.00–0.07)
Basophils Absolute: 0 10*3/uL (ref 0.0–0.1)
Basophils Relative: 1 %
Eosinophils Absolute: 0.1 10*3/uL (ref 0.0–0.5)
Eosinophils Relative: 2 %
HCT: 28.1 % — ABNORMAL LOW (ref 36.0–46.0)
Hemoglobin: 9.1 g/dL — ABNORMAL LOW (ref 12.0–15.0)
Immature Granulocytes: 2 %
Lymphocytes Relative: 13 %
Lymphs Abs: 0.6 10*3/uL — ABNORMAL LOW (ref 0.7–4.0)
MCH: 31.3 pg (ref 26.0–34.0)
MCHC: 32.4 g/dL (ref 30.0–36.0)
MCV: 96.6 fL (ref 80.0–100.0)
Monocytes Absolute: 0.5 10*3/uL (ref 0.1–1.0)
Monocytes Relative: 11 %
Neutro Abs: 3.3 10*3/uL (ref 1.7–7.7)
Neutrophils Relative %: 71 %
Platelets: 268 10*3/uL (ref 150–400)
RBC: 2.91 MIL/uL — ABNORMAL LOW (ref 3.87–5.11)
RDW: 17.3 % — ABNORMAL HIGH (ref 11.5–15.5)
WBC: 4.6 10*3/uL (ref 4.0–10.5)
nRBC: 0 % (ref 0.0–0.2)

## 2017-11-07 LAB — URINALYSIS, COMPLETE (UACMP) WITH MICROSCOPIC
Bilirubin Urine: NEGATIVE
Glucose, UA: NEGATIVE mg/dL
Hgb urine dipstick: NEGATIVE
Ketones, ur: NEGATIVE mg/dL
Nitrite: NEGATIVE
Protein, ur: NEGATIVE mg/dL
Specific Gravity, Urine: 1.02 (ref 1.005–1.030)
pH: 5 (ref 5.0–8.0)

## 2017-11-07 LAB — COMPREHENSIVE METABOLIC PANEL
ALT: 27 U/L (ref 0–44)
AST: 26 U/L (ref 15–41)
Albumin: 3.9 g/dL (ref 3.5–5.0)
Alkaline Phosphatase: 91 U/L (ref 38–126)
Anion gap: 7 (ref 5–15)
BUN: 17 mg/dL (ref 8–23)
CO2: 22 mmol/L (ref 22–32)
Calcium: 9.2 mg/dL (ref 8.9–10.3)
Chloride: 108 mmol/L (ref 98–111)
Creatinine, Ser: 0.68 mg/dL (ref 0.44–1.00)
GFR calc Af Amer: >60 mL/min (ref >60)
GFR calc non Af Amer: >60 mL/min (ref >60)
Glucose, Bld: 101 mg/dL — ABNORMAL HIGH (ref 70–99)
Potassium: 3.5 mmol/L (ref 3.5–5.1)
Sodium: 137 mmol/L (ref 135–145)
Total Bilirubin: 0.6 mg/dL (ref 0.3–1.2)
Total Protein: 6.8 g/dL (ref 6.5–8.1)

## 2017-11-07 LAB — SAMPLE TO BLOOD BANK

## 2017-11-07 MED ORDER — HEPARIN SOD (PORK) LOCK FLUSH 100 UNIT/ML IV SOLN
500.0000 [IU] | Freq: Once | INTRAVENOUS | Status: AC
  Administered 2017-11-07: 500 [IU] via INTRAVENOUS

## 2017-11-07 MED ORDER — HEPARIN SOD (PORK) LOCK FLUSH 100 UNIT/ML IV SOLN
INTRAVENOUS | Status: AC
  Filled 2017-11-07: qty 5

## 2017-11-07 NOTE — Progress Notes (Signed)
Per Tillie Rung, Dr. Grayland Ormond says no treatment today.  Pt verbalizes understanding to keep follow up appt.

## 2017-11-08 LAB — URINE CULTURE

## 2017-11-11 NOTE — Progress Notes (Signed)
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  INTERVAL HISTORY: Patient returns to clinic today for further evaluation and reconsideration of cycle 5 of dose reduced Taxol.  Despite skipping treatment last week, patient states her peripheral neuropathy is significantly worse. She continues to have chronic weakness and fatigue. She continues to be highly anxious. She has no other neurologic complaints. She has a good appetite and denies weight loss. She has no chest pain or shortness of breath.  She denies any nausea, vomiting, constipation, or diarrhea.  She has no urinary complaints.  Patient offers no further specific complaints today.  REVIEW OF SYSTEMS:   Review of Systems  Constitutional: Positive for malaise/fatigue. Negative for fever and weight loss.  Respiratory: Negative.  Negative for cough and shortness of breath.   Cardiovascular: Negative.  Negative for chest pain and leg swelling.  Gastrointestinal: Negative.  Negative for abdominal pain and constipation.  Genitourinary: Negative.  Negative for dysuria and urgency.  Musculoskeletal: Negative.  Negative for back pain.  Skin: Negative.  Negative for rash.  Neurological: Positive for tingling, sensory change and weakness. Negative for dizziness, focal weakness and headaches.  Psychiatric/Behavioral: The patient is nervous/anxious.     As per HPI. Otherwise, a complete review of systems is negative.  PAST MEDICAL HISTORY: Past Medical History:  Diagnosis Date  . Anxiety   . Cancer ([name]) 07/2017   left breast  . Depression   . Hypertension   .  Hypothyroidism   . Rapid heart rate   . Thyroid disease     PAST SURGICAL HISTORY: Past Surgical History:  Procedure Laterality Date  . AXILLARY LYMPH NODE BIOPSY Left 07/16/2017   METASTATIC MAMMARY CARCINOMA  . BREAST BIOPSY Left 07/16/2017   [place] bx of left breast mass and left breast LN.  INVASIVE MAMMARY CARCINOMA, NO SPECIAL TYPE.   Marland Kitchen BREAST EXCISIONAL BIOPSY Right 2001  . BREAST LUMPECTOMY Right    2001  . COLONOSCOPY    . PORTACATH PLACEMENT Right 08/07/2017   Procedure: INSERTION PORT-A-CATH;  Surgeon: Robert Bellow, MD;  Location: [facility] ORS;  Service: General;  Laterality: Right;  . TONSILLECTOMY      FAMILY HISTORY: Family History  Problem Relation Age of Onset  . Stroke Mother   . Thyroid disease Mother   . Renal Disease Mother   . Stroke Father   . Heart attack Father   . Sudden death Father 24       suicide  . Anuerysm Brother   . Breast cancer Neg Hx     ADVANCED DIRECTIVES (Y/N):  N  HEALTH MAINTENANCE: Social History   Tobacco Use  . Smoking status: Current Some Day Smoker    Packs/day: 0.50    Years: 30.00    Pack years: 15.00    Types: Cigarettes  . Smokeless tobacco: Never Used  Substance Use Topics  . Alcohol use: Yes    Comment: occasional q 38mo  . Drug use: Never     Colonoscopy:  PAP:  Bone density:  Lipid panel:  No Known Allergies  Current Outpatient Medications  Medication Sig Dispense Refill  . acetaminophen (TYLENOL) 500 MG tablet Take 1,000  mg by mouth every 6 (six) hours as needed for fever.    Marland Kitchen acidophilus (RISAQUAD) CAPS capsule Take 1 capsule by mouth daily. 30 capsule 0  . albuterol (PROVENTIL HFA;VENTOLIN HFA) 108 (90 Base) MCG/ACT inhaler Inhale 2 puffs into the lungs every 6 (six) hours as needed for wheezing or shortness of breath. 1 Inhaler 2  . ALPRAZolam (XANAX) 0.25 MG tablet Take 0.25 mg by mouth 2 (two) times daily.     Marland Kitchen amLODipine (NORVASC) 5 MG tablet Take 5 mg by mouth daily.    Marland Kitchen aspirin EC 81 MG  tablet Take 81 mg by mouth daily.    Marland Kitchen HYDROcodone-acetaminophen (NORCO/VICODIN) 5-325 MG tablet Take 1 tablet by mouth every 4 (four) hours as needed for moderate pain. 10 tablet 0  . levothyroxine (SYNTHROID, LEVOTHROID) 100 MCG tablet Take 100 mcg by mouth daily before breakfast.    . lidocaine-prilocaine (EMLA) cream Apply to affected area once 30 g 3  . LYSINE PO Take 1 Dose by mouth as needed (fever blister).     . ondansetron (ZOFRAN) 8 MG tablet Take 1 tablet (8 mg total) by mouth 2 (two) times daily as needed. Start on the third day after chemotherapy. 30 tablet 2  . Potassium 99 MG TABS Take 99 mg by mouth daily.    . prochlorperazine (COMPAZINE) 10 MG tablet TAKE 1 TABLET(10 MG) BY MOUTH EVERY 6 HOURS AS NEEDED FOR NAUSEA OR VOMITING 360 tablet 2  . traZODone (DESYREL) 50 MG tablet Take 50 mg by mouth at bedtime.    . valACYclovir (VALTREX) 1000 MG tablet Take 1 tablet (1,000 mg total) by mouth 2 (two) times daily. 14 tablet 0   No current facility-administered medications for this visit.     OBJECTIVE: There were no vitals filed for this visit.   There is no height or weight on file to calculate BMI.    ECOG FS:0 - Asymptomatic  General: Well-developed, well-nourished, no acute distress. Eyes: Pink conjunctiva, anicteric sclera. HEENT: Normocephalic, moist mucous membranes. Breast: Left breast mass reduced in size. Lungs: Clear to auscultation bilaterally. Heart: Regular rate and rhythm. No rubs, murmurs, or gallops. Abdomen: Soft, nontender, nondistended. No organomegaly noted, normoactive bowel sounds. Musculoskeletal: No edema, cyanosis, or clubbing. Neuro: Alert, answering all questions appropriately. Cranial nerves grossly intact. Skin: No rashes or petechiae noted. Psych: Normal affect.  LAB RESULTS:  Lab Results  Component Value Date   NA 135 11/14/2017   K 3.8 11/14/2017   CL 104 11/14/2017   CO2 22 11/14/2017   GLUCOSE 99 11/14/2017   BUN 12 11/14/2017    CREATININE 0.87 11/14/2017   CALCIUM 9.7 11/14/2017   PROT 7.3 11/14/2017   ALBUMIN 4.1 11/14/2017   AST 26 11/14/2017   ALT 26 11/14/2017   ALKPHOS 127 (H) 11/14/2017   BILITOT 0.8 11/14/2017   GFRNONAA >60 11/14/2017   GFRAA >60 11/14/2017    Lab Results  Component Value Date   WBC 6.0 11/14/2017   NEUTROABS 4.2 11/14/2017   HGB 10.8 (L) 11/14/2017   HCT 34.0 (L) 11/14/2017   MCV 95.0 11/14/2017   PLT 292 11/14/2017     STUDIES: No results found.  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  PLAN:    1. 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  Case was also discussed  extensively at case conference. Given the size and the stage of patient's malignancy, she will benefit from neoadjuvant chemotherapy using Adriamycin, Cytoxan, and Taxol.  Patient will likely require adjuvant XRT followed by an aromatase inhibitor for 5 years at the conclusion of her treatments.  CT scan of the chest, abdomen, pelvis completed on August 01, 2017 did not reveal any metastatic disease.  MUGA scan on August 02, 2017 revealed EF of 61% which is adequate to proceed with treatment.  Because patient's peripheral neuropathy is worse and affecting her day-to-day activity will discontinue Taxol at this time.  Patient expressed understanding that this possibly could increase her risk of recurrence.  Patient will now proceed with surgical resection and referral has been sent back to surgery.  Return to clinic after her surgery in 6 weeks for further evaluation and additional treatment planning.  2.  Anemia: Patient's hemoglobin is slightly improved to 10.8, monitor. 3.  Peripheral neuropathy: Discontinue Taxol as above.  I spent a total of 30 minutes face-to-face with the patient of which greater than 50% of the  visit was spent in counseling and coordination of care as detailed above.  Patient expressed understanding and was in agreement with this plan. She also understands that She can call clinic at any time with any questions, concerns, or complaints.   Cancer Staging Breast cancer, stage 2, left ([place]) Staging form: Breast, AJCC 8th Edition - Clinical stage from 07/30/2017: Stage IIA (cT2, cN1, cM0, G2, ER+, PR+, HER2-) - Signed by Lloyd Huger, MD on 07/30/2017   Lloyd Huger, MD   11/16/2017 7:46 AM

## 2017-11-14 ENCOUNTER — Inpatient Hospital Stay: Payer: Medicare Other

## 2017-11-14 ENCOUNTER — Inpatient Hospital Stay (HOSPITAL_BASED_OUTPATIENT_CLINIC_OR_DEPARTMENT_OTHER): Payer: Medicare Other | Admitting: Oncology

## 2017-11-14 DIAGNOSIS — C50912 Malignant neoplasm of unspecified site of left female breast: Secondary | ICD-10-CM

## 2017-11-14 DIAGNOSIS — C50412 Malignant neoplasm of upper-outer quadrant of left female breast: Secondary | ICD-10-CM | POA: Diagnosis not present

## 2017-11-14 DIAGNOSIS — D649 Anemia, unspecified: Secondary | ICD-10-CM

## 2017-11-14 DIAGNOSIS — F17210 Nicotine dependence, cigarettes, uncomplicated: Secondary | ICD-10-CM | POA: Diagnosis not present

## 2017-11-14 DIAGNOSIS — Z5111 Encounter for antineoplastic chemotherapy: Secondary | ICD-10-CM | POA: Diagnosis not present

## 2017-11-14 DIAGNOSIS — I10 Essential (primary) hypertension: Secondary | ICD-10-CM | POA: Diagnosis not present

## 2017-11-14 DIAGNOSIS — Z170 Estrogen receptor positive status [ER+]: Secondary | ICD-10-CM | POA: Diagnosis not present

## 2017-11-14 DIAGNOSIS — T451X5A Adverse effect of antineoplastic and immunosuppressive drugs, initial encounter: Secondary | ICD-10-CM

## 2017-11-14 DIAGNOSIS — G620 Drug-induced polyneuropathy: Secondary | ICD-10-CM

## 2017-11-14 DIAGNOSIS — Z23 Encounter for immunization: Secondary | ICD-10-CM | POA: Diagnosis not present

## 2017-11-14 LAB — CBC WITH DIFFERENTIAL/PLATELET
Abs Immature Granulocytes: 0.06 10*3/uL (ref 0.00–0.07)
Basophils Absolute: 0 10*3/uL (ref 0.0–0.1)
Basophils Relative: 1 %
Eosinophils Absolute: 0.1 10*3/uL (ref 0.0–0.5)
Eosinophils Relative: 1 %
HCT: 34 % — ABNORMAL LOW (ref 36.0–46.0)
Hemoglobin: 10.8 g/dL — ABNORMAL LOW (ref 12.0–15.0)
Immature Granulocytes: 1 %
Lymphocytes Relative: 10 %
Lymphs Abs: 0.6 10*3/uL — ABNORMAL LOW (ref 0.7–4.0)
MCH: 30.2 pg (ref 26.0–34.0)
MCHC: 31.8 g/dL (ref 30.0–36.0)
MCV: 95 fL (ref 80.0–100.0)
Monocytes Absolute: 1.1 10*3/uL — ABNORMAL HIGH (ref 0.1–1.0)
Monocytes Relative: 18 %
Neutro Abs: 4.2 10*3/uL (ref 1.7–7.7)
Neutrophils Relative %: 69 %
Platelets: 292 10*3/uL (ref 150–400)
RBC: 3.58 MIL/uL — ABNORMAL LOW (ref 3.87–5.11)
RDW: 15.7 % — ABNORMAL HIGH (ref 11.5–15.5)
WBC: 6 10*3/uL (ref 4.0–10.5)
nRBC: 0 % (ref 0.0–0.2)

## 2017-11-14 LAB — SAMPLE TO BLOOD BANK

## 2017-11-14 LAB — COMPREHENSIVE METABOLIC PANEL
ALT: 26 U/L (ref 0–44)
AST: 26 U/L (ref 15–41)
Albumin: 4.1 g/dL (ref 3.5–5.0)
Alkaline Phosphatase: 127 U/L — ABNORMAL HIGH (ref 38–126)
Anion gap: 9 (ref 5–15)
BUN: 12 mg/dL (ref 8–23)
CO2: 22 mmol/L (ref 22–32)
Calcium: 9.7 mg/dL (ref 8.9–10.3)
Chloride: 104 mmol/L (ref 98–111)
Creatinine, Ser: 0.87 mg/dL (ref 0.44–1.00)
GFR calc Af Amer: >60 mL/min (ref >60)
GFR calc non Af Amer: >60 mL/min (ref >60)
Glucose, Bld: 99 mg/dL (ref 70–99)
Potassium: 3.8 mmol/L (ref 3.5–5.1)
Sodium: 135 mmol/L (ref 135–145)
Total Bilirubin: 0.8 mg/dL (ref 0.3–1.2)
Total Protein: 7.3 g/dL (ref 6.5–8.1)

## 2017-11-14 MED ORDER — HEPARIN SOD (PORK) LOCK FLUSH 100 UNIT/ML IV SOLN
500.0000 [IU] | Freq: Once | INTRAVENOUS | Status: AC
  Administered 2017-11-14: 500 [IU] via INTRAVENOUS
  Filled 2017-11-14: qty 5

## 2017-11-14 MED ORDER — SODIUM CHLORIDE 0.9% FLUSH
10.0000 mL | INTRAVENOUS | Status: DC | PRN
  Administered 2017-11-14: 10 mL via INTRAVENOUS
  Filled 2017-11-14: qty 10

## 2017-11-18 ENCOUNTER — Other Ambulatory Visit: Payer: Self-pay | Admitting: *Deleted

## 2017-11-18 DIAGNOSIS — C50919 Malignant neoplasm of unspecified site of unspecified female breast: Secondary | ICD-10-CM

## 2017-11-19 ENCOUNTER — Encounter: Payer: Self-pay | Admitting: General Surgery

## 2017-11-19 ENCOUNTER — Ambulatory Visit (INDEPENDENT_AMBULATORY_CARE_PROVIDER_SITE_OTHER): Payer: Medicare Other

## 2017-11-19 ENCOUNTER — Other Ambulatory Visit: Payer: Self-pay

## 2017-11-19 ENCOUNTER — Ambulatory Visit (INDEPENDENT_AMBULATORY_CARE_PROVIDER_SITE_OTHER): Payer: Medicare Other | Admitting: General Surgery

## 2017-11-19 VITALS — BP 116/68 | HR 72 | Resp 14 | Ht 66.0 in | Wt 209.0 lb

## 2017-11-19 DIAGNOSIS — C50912 Malignant neoplasm of unspecified site of left female breast: Secondary | ICD-10-CM

## 2017-11-19 NOTE — Progress Notes (Signed)
Patient ID: Alicia [name], female   DOB: Mar 27, 1950, 67 y.o.   MRN: [id number]  Chief Complaint  Patient presents with  . Follow-up    HPI Alicia [name] is a 67 y.o. female. Here for follow up left breast cancer and discuss surgery. She had to stop  chemotherapy on 10-31-17, due to neuropathy.  The patient was last seen in July of this year prior to starting neoadjuvant chemotherapy.  She had been identified with a palpable mass in the upper outer quadrant of left breast as well as a palpable lymph gland.  She tolerated her doxorubicin and Cytoxan therapy, but had several hospitalizations for sepsis.  The Taxol has produced intolerable neuropathy and she stopped treatments early.  The last treatment was October 31, 2017.  She is here with her daughter in law, Asencion Partridge.  HPI  Past Medical History:  Diagnosis Date  . Anxiety   . Cancer ([place]) 07/2017   left breast  . Depression   . Hypertension   . Hypothyroidism   . Rapid heart rate   . Thyroid disease     Past Surgical History:  Procedure Laterality Date  . AXILLARY LYMPH NODE BIOPSY Left 07/16/2017   METASTATIC MAMMARY CARCINOMA  . BREAST BIOPSY Left 07/16/2017   [place] bx of left breast mass and left breast LN.  INVASIVE MAMMARY CARCINOMA, NO SPECIAL TYPE.   Marland Kitchen BREAST EXCISIONAL BIOPSY Right 2001  . BREAST LUMPECTOMY Right    2001  . COLONOSCOPY    . PORTACATH PLACEMENT Right 08/07/2017   Procedure: INSERTION PORT-A-CATH;  Surgeon: Robert Bellow, MD;  Location: [facility] ORS;  Service: General;  Laterality: Right;  . TONSILLECTOMY      Family History  Problem Relation Age of Onset  . Stroke Mother   . Thyroid disease Mother   . Renal Disease Mother   . Stroke Father   . Heart attack Father   . Sudden death Father 35       suicide  . Anuerysm Brother   . Breast cancer Neg Hx     Social History Social History   Tobacco Use  . Smoking status: Current Some Day Smoker    Packs/day: 0.50    Years: 30.00     Pack years: 15.00    Types: Cigarettes  . Smokeless tobacco: Never Used  Substance Use Topics  . Alcohol use: Yes    Comment: occasional q 61mo   . Drug use: Never    No Known Allergies  Current Outpatient Medications  Medication Sig Dispense Refill  . acetaminophen (TYLENOL) 500 MG tablet Take 1,000 mg by mouth every 6 (six) hours as needed for fever.    Marland Kitchen acidophilus (RISAQUAD) CAPS capsule Take 1 capsule by mouth daily. 30 capsule 0  . albuterol (PROVENTIL HFA;VENTOLIN HFA) 108 (90 Base) MCG/ACT inhaler Inhale 2 puffs into the lungs every 6 (six) hours as needed for wheezing or shortness of breath. 1 Inhaler 2  . ALPRAZolam (XANAX) 0.25 MG tablet Take 0.25 mg by mouth 2 (two) times daily.     Marland Kitchen amLODipine (NORVASC) 5 MG tablet Take 5 mg by mouth daily.    Marland Kitchen aspirin EC 81 MG tablet Take 81 mg by mouth daily.    Marland Kitchen HYDROcodone-acetaminophen (NORCO/VICODIN) 5-325 MG tablet Take 1 tablet by mouth every 4 (four) hours as needed for moderate pain. 10 tablet 0  . levothyroxine (SYNTHROID, LEVOTHROID) 100 MCG tablet Take 100 mcg by mouth daily before breakfast.    .  lidocaine-prilocaine (EMLA) cream Apply to affected area once 30 g 3  . LYSINE PO Take 1 Dose by mouth as needed (fever blister).     . ondansetron (ZOFRAN) 8 MG tablet Take 1 tablet (8 mg total) by mouth 2 (two) times daily as needed. Start on the third day after chemotherapy. 30 tablet 2  . Potassium 99 MG TABS Take 99 mg by mouth daily.    . prochlorperazine (COMPAZINE) 10 MG tablet TAKE 1 TABLET(10 MG) BY MOUTH EVERY 6 HOURS AS NEEDED FOR NAUSEA OR VOMITING 360 tablet 2  . traZODone (DESYREL) 50 MG tablet Take 50 mg by mouth at bedtime.    . valACYclovir (VALTREX) 1000 MG tablet Take 1 tablet (1,000 mg total) by mouth 2 (two) times daily. 14 tablet 0   No current facility-administered medications for this visit.     Review of Systems Review of Systems  Constitutional: Positive for fatigue.  Respiratory: Negative.    Cardiovascular: Negative.   Neurological: Positive for headaches.    Blood pressure 116/68, pulse 72, resp. rate 14, height 5\' 6"  (1.676 m), weight 209 lb (94.8 kg), SpO2 98 %.  Physical Exam Physical Exam  Constitutional: She appears well-developed and well-nourished.  Neck: Neck supple. No thyromegaly present.  Cardiovascular: Normal rate and regular rhythm.  Pulmonary/Chest: Effort normal and breath sounds normal.    Lymphadenopathy:    She has no cervical adenopathy.    She has axillary adenopathy (left axilla).       Right: No supraclavicular adenopathy present.       Left: No supraclavicular adenopathy present.  Skin: Skin is warm and dry.  Psychiatric: She has a normal mood and affect. Her behavior is normal. Judgment and thought content normal.    Data Reviewed The left upper outer quadrant mass originally measured 2.2 x 2.4 x 3.6 cm.  The axillary lymph node measured 1.8 x 2.9 cm.  Ultrasound examination of the upper outer quadrant of the left breast in the 2 o'clock position 6 cm from the nipple continues to show a irregular hypoechoic mass but significantly decreased in size at 1.02 x 1.16 x 1.22 cm.  A palpable axillary node is again identified measuring 0.95 x 1.65 x 1.67 cm.  Modestly decreased in size from pretreatment values.  Assessment    Stage II carcinoma the left breast status post neoadjuvant chemotherapy with incomplete response.    Plan    We spent the better part of an hour reviewing her options for management.  For the first portion of the visit she sat with her hands grasped firmly to each other in her lap.  By the end of the visit she had loosened up some and was able to ask questions.  She was under the impression that more surgery (mastectomy) was better than last surgery (lumpectomy).  I tried to clear this up presenting them as equivalent long-term procedures.  The need for radiation therapy with lumpectomy was emphasized to lower the risk of  local recurrence.  The possibility of positive margins requiring return to the OR for lumpectomy was discussed, but I think that this is a low probability event.  The patient would not be a candidate for surgical intervention until next week at the earliest with her recent treatment.  I encouraged her to take this time to consider her options and gave her an informational brochure.  The patient will consider her options and notify the office of how she would like to proceed.  Forest Gleason Tyrea Froberg 11/20/2017, 6:55 PM

## 2017-11-25 ENCOUNTER — Other Ambulatory Visit: Payer: Self-pay

## 2017-11-25 DIAGNOSIS — C50912 Malignant neoplasm of unspecified site of left female breast: Secondary | ICD-10-CM

## 2017-11-26 ENCOUNTER — Other Ambulatory Visit: Payer: Self-pay | Admitting: General Surgery

## 2017-11-26 DIAGNOSIS — C50912 Malignant neoplasm of unspecified site of left female breast: Secondary | ICD-10-CM

## 2017-11-27 ENCOUNTER — Telehealth: Payer: Self-pay

## 2017-11-27 NOTE — Telephone Encounter (Signed)
The patient has decided to have breast sparing surgery The patient is scheduled for surgery at [facility] with Dr Bary Castilla on 12/06/17. She will pre admit at the hospital on 11/29/17 at 9:00 am. She will arrive at the Radiology desk the morning of surgery at 10:45 am. She will make use of EMLA cream applying to her left areola and covering with plastic wrap one hour prior to leaving for surgery on 12/06/17. She will have a short pre op visit with Dr Bary Castilla in office on 11/28/17 att 3:00 pm. The patient is aware of dates, times, and instructions.

## 2017-11-28 ENCOUNTER — Ambulatory Visit (INDEPENDENT_AMBULATORY_CARE_PROVIDER_SITE_OTHER): Payer: Medicare Other | Admitting: General Surgery

## 2017-11-28 ENCOUNTER — Other Ambulatory Visit: Payer: Self-pay

## 2017-11-28 ENCOUNTER — Encounter: Payer: Self-pay | Admitting: General Surgery

## 2017-11-28 VITALS — BP 145/80 | HR 94 | Temp 97.9°F | Resp 18 | Ht 66.0 in | Wt 207.6 lb

## 2017-11-28 DIAGNOSIS — C50912 Malignant neoplasm of unspecified site of left female breast: Secondary | ICD-10-CM | POA: Diagnosis not present

## 2017-11-28 NOTE — Patient Instructions (Signed)
Apply EMLA cream to the areola one hour prior to coming to the hospital.

## 2017-11-28 NOTE — Progress Notes (Signed)
Patient ID: Alicia [name], female   DOB: 08-Jun-1950, 67 y.o.   MRN: [id number]  Chief Complaint  Patient presents with  . Pre-op Exam    Left breast surgery 12/06/17    HPI Alicia [name] is a 67 y.o. female.  Here today for pre-op left breast surgery scheduled 12/06/17.  The patient reports she thinks the left breast mass is larger since her visit last week.  Today's visit was to review options for management and to develop a formal treatment plan regarding surgical therapy.  HPI  Past Medical History:  Diagnosis Date  . Anxiety   . Cancer ([place]) 07/2017   left breast  . Depression   . Hypertension   . Hypothyroidism   . Rapid heart rate   . Thyroid disease     Past Surgical History:  Procedure Laterality Date  . AXILLARY LYMPH NODE BIOPSY Left 07/16/2017   METASTATIC MAMMARY CARCINOMA  . BREAST BIOPSY Left 07/16/2017   [place] bx of left breast mass and left breast LN.  INVASIVE MAMMARY CARCINOMA, NO SPECIAL TYPE.   Marland Kitchen BREAST EXCISIONAL BIOPSY Right 2001  . BREAST LUMPECTOMY Right    2001  . COLONOSCOPY    . PORTACATH PLACEMENT Right 08/07/2017   Procedure: INSERTION PORT-A-CATH;  Surgeon: Robert Bellow, MD;  Location: [facility] ORS;  Service: General;  Laterality: Right;  . TONSILLECTOMY      Family History  Problem Relation Age of Onset  . Stroke Mother   . Thyroid disease Mother   . Renal Disease Mother   . Stroke Father   . Heart attack Father   . Sudden death Father 87       suicide  . Alicia [name]   . Breast cancer Neg Hx     Social History Social History   Tobacco Use  . Smoking status: Current Some Day Smoker    Packs/day: 0.50    Years: 30.00    Pack years: 15.00    Types: Cigarettes  . Smokeless tobacco: Never Used  Substance Use Topics  . Alcohol use: Yes    Comment: occasional q 53mo   . Drug use: Never    Allergies  Allergen Reactions  . Sulfa Antibiotics Diarrhea    Current Outpatient Medications  Medication Sig Dispense  Refill  . acetaminophen (TYLENOL) 500 MG tablet Take 1,000 mg by mouth every 6 (six) hours as needed for moderate pain or headache.     Marland Kitchen acidophilus (RISAQUAD) CAPS capsule Take 1 capsule by mouth daily. 30 capsule 0  . albuterol (PROVENTIL HFA;VENTOLIN HFA) 108 (90 Base) MCG/ACT inhaler Inhale 2 puffs into the lungs every 6 (six) hours as needed for wheezing or shortness of breath. 1 Inhaler 2  . ALPRAZolam (XANAX) 0.25 MG tablet Take 0.25 mg by mouth 2 (two) times daily.     Marland Kitchen amLODipine (NORVASC) 5 MG tablet Take 5 mg by mouth daily.    Marland Kitchen aspirin EC 81 MG tablet Take 81 mg by mouth daily.    Marland Kitchen HYDROcodone-acetaminophen (NORCO/VICODIN) 5-325 MG tablet Take 1 tablet by mouth every 4 (four) hours as needed for moderate pain. 10 tablet 0  . levothyroxine (SYNTHROID, LEVOTHROID) 100 MCG tablet Take 100 mcg by mouth daily before breakfast.    . lidocaine-prilocaine (EMLA) cream Apply to affected area once 30 g 3  . loratadine (CLARITIN) 10 MG tablet Take 10 mg by mouth daily as needed for allergies.    . Lysine 500 MG CAPS Take 500 mg  by mouth daily as needed (for fever blister).     . ondansetron (ZOFRAN) 8 MG tablet Take 1 tablet (8 mg total) by mouth 2 (two) times daily as needed. Start on the third day after chemotherapy. 30 tablet 2  . Potassium 99 MG TABS Take 99 mg by mouth daily.    . prochlorperazine (COMPAZINE) 10 MG tablet TAKE 1 TABLET(10 MG) BY MOUTH EVERY 6 HOURS AS NEEDED FOR NAUSEA OR VOMITING 360 tablet 2  . traZODone (DESYREL) 50 MG tablet Take 50 mg by mouth at bedtime.    . valACYclovir (VALTREX) 1000 MG tablet Take 1 tablet (1,000 mg total) by mouth 2 (two) times daily. 14 tablet 0   No current facility-administered medications for this visit.     Review of Systems Review of Systems  Constitutional: Negative.   Respiratory: Negative.   Skin: Negative.     Blood pressure (!) 145/80, pulse 94, temperature 97.9 F (36.6 C), temperature source Temporal, resp. rate 18,  height 5\' 6"  (1.676 m), weight 207 lb 9.6 oz (94.2 kg), SpO2 100 %.  Physical Exam Physical Exam  Constitutional: She is oriented to person, place, and time. She appears well-developed and well-nourished.  Eyes: Conjunctivae are normal.  Neck: Normal range of motion.  Pulmonary/Chest: Right breast exhibits no inverted nipple, no mass, no nipple discharge, no skin change and no tenderness. Left breast exhibits no inverted nipple, no mass, no nipple discharge, no skin change and no tenderness.  Neurological: She is alert and oriented to person, place, and time.  Skin: Skin is warm and dry.  Psychiatric: She has a normal mood and affect.    Data Reviewed No new data.  Assessment    Stage II carcinoma of the left breast status post neoadjuvant chemotherapy.    Plan    Options for management once again reviewed with mastectomy and breast conservation present is equivalent procedures.  No change in long-term outlook based on management of the breast.  The patient was much more calm today and asked several questions, and appears to be comfortable with the present plan for breast conservation.  Once again reviewed indication for repeat surgery of clear margins are not obtained.  Plans for sentinel node biopsy with the possibility of axillary dissection if residual axillary disease is present.  The patient's been instructed to use her EMLA cream over the areola 1 hour prior to presenting to the hospital to minimize discomfort during sentinel node injection.   HPI, Physical Exam, Assessment and Plan have been scribed under the direction and in the presence of Robert Bellow, MD. Alicia [name], CMA  I have completed the exam and reviewed the above documentation for accuracy and completeness.  I agree with the above.  Alicia [name] has been used and any errors in dictation or transcription are unintentional.  Hervey Ard, M.D., F.A.C.S.  Alicia [name] 11/28/2017, 10:04  PM

## 2017-11-29 ENCOUNTER — Encounter
Admission: RE | Admit: 2017-11-29 | Discharge: 2017-11-29 | Disposition: A | Discharge status: Home / Self Care | Payer: Medicare Other | Source: Ambulatory Visit | Attending: General Surgery | Admitting: General Surgery

## 2017-11-29 ENCOUNTER — Other Ambulatory Visit: Payer: Self-pay

## 2017-11-29 DIAGNOSIS — I10 Essential (primary) hypertension: Secondary | ICD-10-CM | POA: Diagnosis not present

## 2017-11-29 DIAGNOSIS — I440 Atrioventricular block, first degree: Secondary | ICD-10-CM | POA: Insufficient documentation

## 2017-11-29 NOTE — Patient Instructions (Signed)
  Your procedure is scheduled on: Friday December 06, 2017 Report to [facility] @ 10:45am  Remember: Instructions that are not followed completely may result in serious medical risk, up to and including death, or upon the discretion of your surgeon and anesthesiologist your surgery may need to be rescheduled.    __x__ 1. Do not eat food (including mints, candies, chewing gum) after midnight the night before your procedure. You may drink clear liquids up to 2 hours before you are scheduled to arrive at the hospital for your procedure.  Do not drink anything within 2 hours of your scheduled arrival to the hospital.  Approved clear liquids:  --Water or Apple juice without pulp  --Clear carbohydrate beverage such as Gatorade or Powerade  --Black Coffee or Clear Tea (No milk, no creamers, do not add anything to the coffee or tea)    __x__ 2. No Alcohol for 24 hours before or after surgery.   __x__ 3. No Smoking or e-cigarettes for 24 hours before surgery.  Do not use any chewable tobacco products for at least 6 hours before surgery.   __x__ 4. Notify your doctor if there is any change in your medical condition (cold, fever, infections).   __x__ 5. On the morning of surgery brush your teeth with toothpaste and water.  You may rinse your mouth with mouthwash if you wish.  Do not swallow any toothpaste or mouthwash.  Please read over the following fact sheets that you were given:   [facility] Preparing for Surgery and/or MRSA Information    __x__ Use CHG Soap or Sage wipes as directed on instruction sheet    Do not wear jewelry, make-up, hairpins, clips or nail polish on the day of surgery.  Do not wear lotions, powders, deodorant, or perfumes.   Do not shave below the face/neck 48 hours prior to surgery.   Do not bring valuables to the hospital.    [facility] is not responsible for any belongings or valuables.               Contacts, dentures or bridgework may not be worn into  surgery.  For patients discharged on the day of surgery, you will NOT be permitted to drive yourself home.  You must have a responsible adult with you for 24 hours after surgery.  __x__ Take anti-hypertensive listed below, cardiac, seizure, asthma, anti-reflux and psychiatric medicines. These include:  1. Amlodipine/Norvasc  2. Alprazolam/Xanax  3. Levothyroxine/Synthroid  __x__ Use inhalers on the day of surgery and bring them with you to the hospital.  __x__ Follow recommendations from Cardiologist, Pulmonologist or PCP regarding stopping Aspirin, Coumadin, Plavix, Eliquis, Effient, Pradaxa, and Pletal.  __x__ Avoid anti-inflammatories such as Advil, Ibuprofen, Motrin, Aleve, Naproxen, Naprosyn, BC/Goodies powders or aspirin products. You may continue to take Tylenol and Celebrex.

## 2017-11-29 NOTE — Pre-Procedure Instructions (Signed)
EKG in PAT 11/29/2017 preliminary read sinus rhythm 1st degree AVB, no additional cardiac workup needed per Dr. Andree Elk (Anesthesiology).

## 2017-12-06 ENCOUNTER — Ambulatory Visit
Admission: RE | Admit: 2017-12-06 | Discharge: 2017-12-06 | Disposition: A | Discharge status: Home / Self Care | Payer: Medicare Other | Source: Ambulatory Visit | Attending: General Surgery | Admitting: General Surgery

## 2017-12-06 ENCOUNTER — Ambulatory Visit: Payer: Medicare Other | Admitting: Anesthesiology

## 2017-12-06 ENCOUNTER — Encounter: Payer: Self-pay | Admitting: *Deleted

## 2017-12-06 ENCOUNTER — Ambulatory Visit: Payer: Medicare Other

## 2017-12-06 ENCOUNTER — Encounter
Admission: RE | Disposition: A | Discharge status: Home / Self Care | Payer: Self-pay | Source: Ambulatory Visit | Attending: General Surgery

## 2017-12-06 DIAGNOSIS — F419 Anxiety disorder, unspecified: Secondary | ICD-10-CM | POA: Insufficient documentation

## 2017-12-06 DIAGNOSIS — C50912 Malignant neoplasm of unspecified site of left female breast: Secondary | ICD-10-CM | POA: Insufficient documentation

## 2017-12-06 DIAGNOSIS — I10 Essential (primary) hypertension: Secondary | ICD-10-CM | POA: Insufficient documentation

## 2017-12-06 DIAGNOSIS — F17290 Nicotine dependence, other tobacco product, uncomplicated: Secondary | ICD-10-CM | POA: Insufficient documentation

## 2017-12-06 DIAGNOSIS — C773 Secondary and unspecified malignant neoplasm of axilla and upper limb lymph nodes: Secondary | ICD-10-CM | POA: Insufficient documentation

## 2017-12-06 DIAGNOSIS — R000 Tachycardia, unspecified: Secondary | ICD-10-CM | POA: Diagnosis not present

## 2017-12-06 DIAGNOSIS — Z823 Family history of stroke: Secondary | ICD-10-CM | POA: Insufficient documentation

## 2017-12-06 DIAGNOSIS — Z841 Family history of disorders of kidney and ureter: Secondary | ICD-10-CM | POA: Insufficient documentation

## 2017-12-06 DIAGNOSIS — Z9221 Personal history of antineoplastic chemotherapy: Secondary | ICD-10-CM | POA: Insufficient documentation

## 2017-12-06 DIAGNOSIS — E039 Hypothyroidism, unspecified: Secondary | ICD-10-CM | POA: Insufficient documentation

## 2017-12-06 DIAGNOSIS — Z8249 Family history of ischemic heart disease and other diseases of the circulatory system: Secondary | ICD-10-CM | POA: Insufficient documentation

## 2017-12-06 DIAGNOSIS — Z8349 Family history of other endocrine, nutritional and metabolic diseases: Secondary | ICD-10-CM | POA: Insufficient documentation

## 2017-12-06 DIAGNOSIS — F329 Major depressive disorder, single episode, unspecified: Secondary | ICD-10-CM | POA: Insufficient documentation

## 2017-12-06 DIAGNOSIS — C50412 Malignant neoplasm of upper-outer quadrant of left female breast: Secondary | ICD-10-CM | POA: Diagnosis not present

## 2017-12-06 DIAGNOSIS — F418 Other specified anxiety disorders: Secondary | ICD-10-CM | POA: Diagnosis not present

## 2017-12-06 DIAGNOSIS — Z853 Personal history of malignant neoplasm of breast: Secondary | ICD-10-CM

## 2017-12-06 DIAGNOSIS — C779 Secondary and unspecified malignant neoplasm of lymph node, unspecified: Secondary | ICD-10-CM | POA: Diagnosis not present

## 2017-12-06 DIAGNOSIS — C50812 Malignant neoplasm of overlapping sites of left female breast: Secondary | ICD-10-CM | POA: Diagnosis not present

## 2017-12-06 HISTORY — PX: BREAST LUMPECTOMY WITH SENTINEL LYMPH NODE BIOPSY: SHX5597

## 2017-12-06 SURGERY — BREAST LUMPECTOMY WITH SENTINEL LYMPH NODE BX
Anesthesia: General | Laterality: Left

## 2017-12-06 MED ORDER — CELECOXIB 200 MG PO CAPS
200.0000 mg | ORAL_CAPSULE | ORAL | Status: AC
  Administered 2017-12-06: 200 mg via ORAL

## 2017-12-06 MED ORDER — GABAPENTIN 300 MG PO CAPS
300.0000 mg | ORAL_CAPSULE | ORAL | Status: AC
  Administered 2017-12-06: 300 mg via ORAL

## 2017-12-06 MED ORDER — METHYLENE BLUE 0.5 % INJ SOLN
INTRAVENOUS | Status: AC
  Filled 2017-12-06: qty 10

## 2017-12-06 MED ORDER — LIDOCAINE HCL (CARDIAC) PF 100 MG/5ML IV SOSY
PREFILLED_SYRINGE | INTRAVENOUS | Status: DC | PRN
  Administered 2017-12-06: 100 mg via INTRAVENOUS

## 2017-12-06 MED ORDER — MIDAZOLAM HCL 2 MG/2ML IJ SOLN
INTRAMUSCULAR | Status: DC | PRN
  Administered 2017-12-06: 2 mg via INTRAVENOUS

## 2017-12-06 MED ORDER — ACETAMINOPHEN 10 MG/ML IV SOLN
INTRAVENOUS | Status: DC | PRN
  Administered 2017-12-06: 1000 mg via INTRAVENOUS

## 2017-12-06 MED ORDER — EPHEDRINE SULFATE 50 MG/ML IJ SOLN
INTRAMUSCULAR | Status: DC | PRN
  Administered 2017-12-06: 10 mg via INTRAVENOUS

## 2017-12-06 MED ORDER — KETOROLAC TROMETHAMINE 30 MG/ML IJ SOLN
INTRAMUSCULAR | Status: DC | PRN
  Administered 2017-12-06: 30 mg via INTRAVENOUS

## 2017-12-06 MED ORDER — BUPIVACAINE-EPINEPHRINE (PF) 0.5% -1:200000 IJ SOLN
INTRAMUSCULAR | Status: DC | PRN
  Administered 2017-12-06: 30 mL

## 2017-12-06 MED ORDER — FAMOTIDINE 20 MG PO TABS
ORAL_TABLET | ORAL | Status: AC
  Administered 2017-12-06: 20 mg via ORAL
  Filled 2017-12-06: qty 1

## 2017-12-06 MED ORDER — FENTANYL CITRATE (PF) 100 MCG/2ML IJ SOLN
INTRAMUSCULAR | Status: AC
  Filled 2017-12-06: qty 2

## 2017-12-06 MED ORDER — GABAPENTIN 300 MG PO CAPS
ORAL_CAPSULE | ORAL | Status: AC
  Administered 2017-12-06: 300 mg via ORAL
  Filled 2017-12-06: qty 1

## 2017-12-06 MED ORDER — FAMOTIDINE 20 MG PO TABS
20.0000 mg | ORAL_TABLET | Freq: Once | ORAL | Status: AC
  Administered 2017-12-06: 20 mg via ORAL

## 2017-12-06 MED ORDER — DEXAMETHASONE SODIUM PHOSPHATE 10 MG/ML IJ SOLN
INTRAMUSCULAR | Status: DC | PRN
  Administered 2017-12-06: 10 mg via INTRAVENOUS

## 2017-12-06 MED ORDER — CELECOXIB 200 MG PO CAPS
ORAL_CAPSULE | ORAL | Status: AC
  Administered 2017-12-06: 200 mg via ORAL
  Filled 2017-12-06: qty 1

## 2017-12-06 MED ORDER — ONDANSETRON HCL 4 MG/2ML IJ SOLN
INTRAMUSCULAR | Status: DC | PRN
  Administered 2017-12-06: 4 mg via INTRAVENOUS

## 2017-12-06 MED ORDER — TECHNETIUM TC 99M SULFUR COLLOID FILTERED
0.7160 | Freq: Once | INTRAVENOUS | Status: AC | PRN
  Administered 2017-12-06: 0.716 via INTRADERMAL

## 2017-12-06 MED ORDER — MIDAZOLAM HCL 2 MG/2ML IJ SOLN
INTRAMUSCULAR | Status: AC
  Filled 2017-12-06: qty 2

## 2017-12-06 MED ORDER — FENTANYL CITRATE (PF) 100 MCG/2ML IJ SOLN
INTRAMUSCULAR | Status: DC | PRN
  Administered 2017-12-06 (×2): 50 ug via INTRAVENOUS
  Administered 2017-12-06 (×2): 25 ug via INTRAVENOUS

## 2017-12-06 MED ORDER — LACTATED RINGERS IV SOLN
INTRAVENOUS | Status: DC
  Administered 2017-12-06: 12:00:00 via INTRAVENOUS

## 2017-12-06 MED ORDER — METHYLENE BLUE 0.5 % INJ SOLN
INTRAVENOUS | Status: DC | PRN
  Administered 2017-12-06: 5 mL

## 2017-12-06 MED ORDER — FENTANYL CITRATE (PF) 100 MCG/2ML IJ SOLN
25.0000 ug | INTRAMUSCULAR | Status: DC | PRN
  Administered 2017-12-06 (×4): 25 ug via INTRAVENOUS

## 2017-12-06 MED ORDER — HYDROMORPHONE HCL 1 MG/ML IJ SOLN
INTRAMUSCULAR | Status: AC
  Filled 2017-12-06: qty 1

## 2017-12-06 MED ORDER — FENTANYL CITRATE (PF) 100 MCG/2ML IJ SOLN
INTRAMUSCULAR | Status: AC
  Administered 2017-12-06: 25 ug via INTRAVENOUS
  Filled 2017-12-06: qty 2

## 2017-12-06 MED ORDER — PROPOFOL 10 MG/ML IV BOLUS
INTRAVENOUS | Status: DC | PRN
  Administered 2017-12-06: 100 mg via INTRAVENOUS

## 2017-12-06 MED ORDER — PHENYLEPHRINE HCL 10 MG/ML IJ SOLN
INTRAMUSCULAR | Status: DC | PRN
  Administered 2017-12-06: 100 ug via INTRAVENOUS

## 2017-12-06 MED ORDER — LACTATED RINGERS IV SOLN
INTRAVENOUS | Status: DC | PRN
  Administered 2017-12-06: 12:00:00 via INTRAVENOUS

## 2017-12-06 MED ORDER — HYDROMORPHONE HCL 1 MG/ML IJ SOLN
INTRAMUSCULAR | Status: DC | PRN
  Administered 2017-12-06: .4 mg via INTRAVENOUS

## 2017-12-06 MED ORDER — ONDANSETRON HCL 4 MG/2ML IJ SOLN: 4.0000 mg | Freq: Once | INTRAMUSCULAR | Status: DC | PRN

## 2017-12-06 MED ORDER — ACETAMINOPHEN 10 MG/ML IV SOLN
INTRAVENOUS | Status: AC
  Filled 2017-12-06: qty 100

## 2017-12-06 MED ORDER — HYDROCODONE-ACETAMINOPHEN 5-325 MG PO TABS: 1.0000 | ORAL_TABLET | ORAL | Status: DC | PRN

## 2017-12-06 MED ORDER — HYDROCODONE-ACETAMINOPHEN 5-325 MG PO TABS: 1.0000 | ORAL_TABLET | ORAL | 0 refills | Status: DC | PRN

## 2017-12-06 SURGICAL SUPPLY — 58 items
BINDER BREAST LRG (GAUZE/BANDAGES/DRESSINGS) IMPLANT
BINDER BREAST MEDIUM (GAUZE/BANDAGES/DRESSINGS) IMPLANT
BINDER BREAST XLRG (GAUZE/BANDAGES/DRESSINGS) ×2 IMPLANT
BINDER BREAST XXLRG (GAUZE/BANDAGES/DRESSINGS) IMPLANT
BLADE SURG 15 STRL SS SAFETY (BLADE) ×4 IMPLANT
BULB RESERV EVAC DRAIN JP 100C (MISCELLANEOUS) ×2 IMPLANT
CANISTER SUCT 1200ML W/VALVE (MISCELLANEOUS) ×2 IMPLANT
CHLORAPREP W/TINT 26ML (MISCELLANEOUS) ×2 IMPLANT
CNTNR SPEC 2.5X3XGRAD LEK (MISCELLANEOUS) ×1
CONT SPEC 4OZ STER OR WHT (MISCELLANEOUS) ×1
CONTAINER SPEC 2.5X3XGRAD LEK (MISCELLANEOUS) ×1 IMPLANT
COVER PROBE FLX POLY STRL (MISCELLANEOUS) ×2 IMPLANT
COVER WAND RF STERILE (DRAPES) IMPLANT
DEVICE DUBIN SPECIMEN MAMMOGRA (MISCELLANEOUS) ×2 IMPLANT
DRAIN CHANNEL JP 15F RND 16 (MISCELLANEOUS) ×2 IMPLANT
DRAPE LAPAROTOMY TRNSV 106X77 (MISCELLANEOUS) ×2 IMPLANT
DRSG GAUZE FLUFF 36X18 (GAUZE/BANDAGES/DRESSINGS) ×4 IMPLANT
DRSG TEGADERM 4X4.75 (GAUZE/BANDAGES/DRESSINGS) ×2 IMPLANT
DRSG TELFA 3X8 NADH (GAUZE/BANDAGES/DRESSINGS) ×2 IMPLANT
DRSG TELFA 4X3 1S NADH ST (GAUZE/BANDAGES/DRESSINGS) ×2 IMPLANT
ELECT CAUTERY BLADE TIP 2.5 (TIP) ×2
ELECT EZSTD 165MM 6.5IN (MISCELLANEOUS) ×2
ELECT REM PT RETURN 9FT ADLT (ELECTROSURGICAL) ×2
ELECTRODE CAUTERY BLDE TIP 2.5 (TIP) ×1 IMPLANT
ELECTRODE EZSTD 165MM 6.5IN (MISCELLANEOUS) ×1 IMPLANT
ELECTRODE REM PT RTRN 9FT ADLT (ELECTROSURGICAL) ×1 IMPLANT
GAUZE SPONGE 4X4 12PLY STRL (GAUZE/BANDAGES/DRESSINGS) IMPLANT
GLOVE BIO SURGEON STRL SZ7.5 (GLOVE) ×4 IMPLANT
GLOVE INDICATOR 8.0 STRL GRN (GLOVE) ×4 IMPLANT
GOWN STRL REUS W/ TWL LRG LVL3 (GOWN DISPOSABLE) ×2 IMPLANT
GOWN STRL REUS W/TWL LRG LVL3 (GOWN DISPOSABLE) ×2
KIT TURNOVER KIT A (KITS) ×2 IMPLANT
LABEL OR SOLS (LABEL) ×2 IMPLANT
MARGIN MAP 10MM (MISCELLANEOUS) ×2 IMPLANT
NEEDLE HYPO 22GX1.5 SAFETY (NEEDLE) ×2 IMPLANT
NEEDLE HYPO 25X1 1.5 SAFETY (NEEDLE) ×4 IMPLANT
PACK BASIN MINOR ARMC (MISCELLANEOUS) ×2 IMPLANT
RETRACTOR RING XSMALL (MISCELLANEOUS) ×1 IMPLANT
RTRCTR WOUND ALEXIS 13CM XS SH (MISCELLANEOUS) ×2
SHEARS FOC LG CVD HARMONIC 17C (MISCELLANEOUS) ×2 IMPLANT
SHEARS HARMONIC 9CM CVD (BLADE) IMPLANT
SLEVE PROBE SENORX GAMMA FIND (MISCELLANEOUS) ×2 IMPLANT
STRIP CLOSURE SKIN 1/2X4 (GAUZE/BANDAGES/DRESSINGS) ×2 IMPLANT
SUT ETHILON 3-0 FS-10 30 BLK (SUTURE) ×2
SUT SILK 2 0 (SUTURE) ×1
SUT SILK 2-0 18XBRD TIE 12 (SUTURE) ×1 IMPLANT
SUT VIC AB 2-0 CT1 27 (SUTURE) ×5
SUT VIC AB 2-0 CT1 TAPERPNT 27 (SUTURE) ×5 IMPLANT
SUT VIC AB 3-0 SH 27 (SUTURE) ×2
SUT VIC AB 3-0 SH 27X BRD (SUTURE) ×2 IMPLANT
SUT VIC AB 4-0 FS2 27 (SUTURE) ×4 IMPLANT
SUT VICRYL+ 3-0 144IN (SUTURE) ×2 IMPLANT
SUTURE EHLN 3-0 FS-10 30 BLK (SUTURE) ×1 IMPLANT
SWABSTK COMLB BENZOIN TINCTURE (MISCELLANEOUS) ×2 IMPLANT
SYR 10ML LL (SYRINGE) ×2 IMPLANT
SYR BULB IRRIG 60ML STRL (SYRINGE) ×2 IMPLANT
TAPE TRANSPORE STRL 2 31045 (GAUZE/BANDAGES/DRESSINGS) ×2 IMPLANT
WATER STERILE IRR 1000ML POUR (IV SOLUTION) ×2 IMPLANT

## 2017-12-06 NOTE — Anesthesia Procedure Notes (Signed)
Procedure Name: LMA Insertion Date/Time: 12/06/2017 12:16 PM Performed by: Justus Memory, CRNA Pre-anesthesia Checklist: Patient identified, Patient being monitored, Timeout performed, Emergency Drugs available and Suction available Patient Re-evaluated:Patient Re-evaluated prior to induction Oxygen Delivery Method: Circle system utilized Preoxygenation: Pre-oxygenation with 100% oxygen Induction Type: IV induction Ventilation: Mask ventilation without difficulty LMA: LMA inserted LMA Size: 4.5 Tube type: Oral Number of attempts: 1 Placement Confirmation: positive ETCO2 and breath sounds checked- equal and bilateral Tube secured with: Tape Dental Injury: Teeth and Oropharynx as per pre-operative assessment

## 2017-12-06 NOTE — H&P (Signed)
The patient tolerated sentinel node injection well.  No change in clinical history or exam.  Plans for left breast wide excision sentinel node biopsy and possible axillary dissection reviewed.

## 2017-12-06 NOTE — Anesthesia Post-op Follow-up Note (Signed)
Anesthesia QCDR form completed.        

## 2017-12-06 NOTE — Transfer of Care (Signed)
Immediate Anesthesia Transfer of Care Note  Patient: Alicia [name] [facility]  Procedure(s) Performed: BREAST LUMPECTOMY WITH SENTINEL LYMPH NODE BX (Left )  Patient Location: PACU  Anesthesia Type:General  Level of Consciousness: sedated  Airway & Oxygen Therapy: Patient Spontanous Breathing and Patient connected to face mask oxygen  Post-op Assessment: Report given to RN and Post -op Vital signs reviewed and stable  Post vital signs: Reviewed and stable  Last Vitals:  Vitals Value Taken Time  BP 111/66 12/06/2017  2:42 PM  Temp    Pulse 89 12/06/2017  2:47 PM  Resp 15 12/06/2017  2:47 PM  SpO2 96 % 12/06/2017  2:47 PM  Vitals shown include unvalidated device data.  Last Pain:  Vitals:   12/06/17 1145  TempSrc: Temporal  PainSc: 0-No pain         Complications: none

## 2017-12-06 NOTE — Op Note (Signed)
Preoperative diagnosis: Left breast cancer with nodal metastases.  Status post neoadjuvant chemotherapy.  Postoperative diagnosis: Same.  Operative procedure: Ultrasound-guided sentinel node biopsy, axillary dissection, wide local excision with tissue transfer for wound closure.  Operating Surgeon: Charlie Pitter, MD.  Anesthesia: General by LMA, Marcaine 0.5% with 1 to 200,000 units of epinephrine, 30 cc.  Estimated blood loss: Less than 30 cc.  Clinical note: This 67 year old woman was identified with a left breast mass and core biopsy of an axillary node positive for metastatic disease.  She is undergone neoadjuvant chemotherapy.  She is brought to the operating at this time for planned breast conservation.  She underwent injection with technetium sulfur colloid prior to the procedure.  Operative note: With the patient under adequate general anesthesia the breast was cleansed with alcohol and 5 cc of 0.5% methylene blue was injected in subareolar plexus.  The breast chest and axilla was then cleansed with ChloraPrep and draped.  Attention was turned to the axilla.  Ultrasound was used to identify the area of prominent node appreciated throughout her neoadjuvant treatment.  A skin line incision was made after injection of local anesthesia and the 1.5 cm hard node was excised and sent for frozen section reporting metastatic disease.  While the lymph node specimen was being processed attention was turned to the breast were in the 3 o'clock position 6 cm from the nipple there was a 3-4 cm fullness.  This corresponds to the area of previously confirmed cancer.  Local anesthetic was infiltrated.  A radial incision was made beginning about a centimeter outside the edge of the areola.  Skin was incised sharply and then hemostasis achieved electrocautery.  The firm tissue extended to within about 5 mm of the overlying dermis.  A 4 cm cube of tissue was excised, orientated and specimen radiograph confirmed  the previously placed clip being in the center of the specimen.  Pathologic report showed the margins grossly clear.  While the breast specimen was being processed attention turned to the axilla where a formal axillary dissection was completed.  Hemostasis was with the harmonic scalpel and 2-0 silk ties.  The margins of the resection where the axillary vein superiorly, medially and the latissimus muscle posteriorly.  The long thoracic nerve of Bell and thoracodorsal nerve artery and vein bundles were identified and protected throughout the procedure.  Axillary contents were sent in formalin for routine histology.  A 15 [place] Blake drain was brought through a separate stab wound incision and anchored in place with a 3-0 nylon tie and later placed to close suction.  The wound was closed in layers with interrupted 2-0 Vicryl figure-of-eight sutures to the axillary envelope and to the superficial fat.  The skin was closed with a running 4-0 Vicryl subcuticular suture.  After the pathology report was received the defect was closed by elevating the breast parenchyma off the underlying pectoralis muscle and serratus muscle for distance of approximately 6 cm.  The fascia was then approximated with interrupted 2-0 Vicryl figure-of-eight sutures.  The deep adipose tissue was approximated in similar fashion.  Superficial adipose tissue was approximated in a similar fashion.  Skin was closed with a running 4-0 Vicryl subcuticular suture.  Benzoin and Steri-Strips were applied to both wounds.  Telfa and Tegaderm applied to the drain site.  Telfa, fluff gauze and a compressive wrap applied to the axillary incision and the wide excision site.  Patient tolerated the procedure well and was taken to recovery in stable condition.

## 2017-12-06 NOTE — Discharge Instructions (Signed)

## 2017-12-06 NOTE — Anesthesia Post-op Follow-up Note (Deleted)
Anesthesia QCDR form completed.        

## 2017-12-06 NOTE — Anesthesia Postprocedure Evaluation (Signed)
Anesthesia Post Note  Patient: Alicia [name] [facility]  Procedure(s) Performed: BREAST LUMPECTOMY WITH SENTINEL LYMPH NODE BX (Left )  Patient location during evaluation: PACU Anesthesia Type: General Level of consciousness: awake and alert Pain management: pain level controlled Vital Signs Assessment: post-procedure vital signs reviewed and stable Respiratory status: spontaneous breathing, nonlabored ventilation, respiratory function stable and patient connected to nasal cannula oxygen Cardiovascular status: blood pressure returned to baseline and stable Postop Assessment: no apparent nausea or vomiting Anesthetic complications: no     Last Vitals:  Vitals:   12/06/17 1535 12/06/17 1633  BP: 116/60 124/67  Pulse: 77 82  Resp: 16 16  Temp: (!) 36.4 C   SpO2: 93% 96%    Last Pain:  Vitals:   12/06/17 1535  TempSrc: Temporal  PainSc: [place]

## 2017-12-06 NOTE — Anesthesia Preprocedure Evaluation (Signed)
Anesthesia Evaluation  Patient identified by MRN, date of birth, ID band Patient awake    Reviewed: Allergy & Precautions, H&P , NPO status , Patient's Chart, lab work & pertinent test results, reviewed documented beta blocker date and time   Airway Mallampati: II  TM Distance: >3 FB Neck ROM: full    Dental  (+) Caps, Teeth Intact   Pulmonary Current Smoker,    Pulmonary exam normal        Cardiovascular hypertension, Normal cardiovascular exam     Neuro/Psych PSYCHIATRIC DISORDERS Anxiety Depression    GI/Hepatic negative GI ROS, Neg liver ROS,   Endo/Other  Hypothyroidism   Renal/GU negative Renal ROS  negative genitourinary   Musculoskeletal negative musculoskeletal ROS (+)   Abdominal   Peds negative pediatric ROS (+)  Hematology   Anesthesia Other Findings Past Medical History: No date: Anxiety No date: Depression No date: Hypertension No date: Hypothyroidism No date: Rapid heart rate No date: Thyroid disease  Past Surgical History: 07/16/2017: AXILLARY LYMPH NODE BIOPSY; Left     Comment:  METASTATIC MAMMARY CARCINOMA 07/16/2017: BREAST BIOPSY; Left     Comment:  [place] bx of left breast mass and left breast LN.  INVASIVE               MAMMARY CARCINOMA, NO SPECIAL TYPE.  2001: BREAST EXCISIONAL BIOPSY; Right No date: BREAST LUMPECTOMY; Right     Comment:  2001 No date: COLONOSCOPY No date: TONSILLECTOMY  BMI    Body Mass Index:  35.72 kg/m      Reproductive/Obstetrics                             Anesthesia Physical  Anesthesia Plan  ASA: III  Anesthesia Plan: General   Post-op Pain Management:    Induction:   PONV Risk Score and Plan: 3 and Treatment may vary due to age or medical condition and Ondansetron  Airway Management Planned: LMA  Additional Equipment:   Intra-op Plan:   Post-operative Plan: Extubation in OR  Informed Consent: I have reviewed  the patients History and Physical, chart, labs and discussed the procedure including the risks, benefits and alternatives for the proposed anesthesia with the patient or authorized representative who has indicated his/her understanding and acceptance.   Dental Advisory Given  Plan Discussed with: CRNA  Anesthesia Plan Comments:         Anesthesia Quick Evaluation

## 2017-12-08 ENCOUNTER — Encounter: Payer: Self-pay | Admitting: General Surgery

## 2017-12-09 ENCOUNTER — Telehealth: Payer: Self-pay | Admitting: *Deleted

## 2017-12-09 ENCOUNTER — Ambulatory Visit: Payer: Medicare Other

## 2017-12-09 DIAGNOSIS — C50912 Malignant neoplasm of unspecified site of left female breast: Secondary | ICD-10-CM

## 2017-12-09 NOTE — Progress Notes (Signed)
Patient came in today for a wound check.  The wound is clean, with no signs of infection noted. Follow up as scheduled.    Patient to follow up with Dr.Byrnett 12/10/17

## 2017-12-09 NOTE — Telephone Encounter (Signed)
Patient aware to keep appointment as scheduled for 12-17-17. She does not need to be seen sooner per Dr. Bary Castilla.  The patient verbalizes understanding.

## 2017-12-09 NOTE — Telephone Encounter (Signed)
-----   Message from Robert Bellow, MD sent at 12/09/2017 11:50 AM EST ----- 12/3 should be about right.  ----- Message ----- From: Dominga Ferry, CMA Sent: 12/09/2017  10:36 AM EST To: Robert Bellow, MD  Patient was seen in the office today for a nurse visit. Still draining greater than 30. Patient does not have a follow up appointment with you. I scheduled patient for follow up on 12-17-17. Does she need to be seen with you tomorrow instead? Path still pending. Thanks.

## 2017-12-11 ENCOUNTER — Encounter: Payer: Self-pay | Admitting: General Surgery

## 2017-12-11 ENCOUNTER — Telehealth: Payer: Self-pay | Admitting: General Surgery

## 2017-12-11 ENCOUNTER — Other Ambulatory Visit: Payer: Self-pay | Admitting: Anatomic Pathology & Clinical Pathology

## 2017-12-11 LAB — SURGICAL PATHOLOGY

## 2017-12-11 NOTE — Telephone Encounter (Signed)
The patient was notified of her pathology results, in particular all but 1 lymph node positive for residual tumor and multiple margins positive.  Breast malignancy measured 43 mm.  Minimal effects of neoadjuvant treatment reported by the pathologist.  She is aware we will need to discuss reexcision to achieve negative margins.  Plans for radiation were again reviewed.  She is having minimal pain.  Follow-up is scheduled for December 17, 2017.

## 2017-12-17 ENCOUNTER — Ambulatory Visit (INDEPENDENT_AMBULATORY_CARE_PROVIDER_SITE_OTHER): Payer: Medicare Other | Admitting: General Surgery

## 2017-12-17 ENCOUNTER — Other Ambulatory Visit: Payer: Self-pay

## 2017-12-17 DIAGNOSIS — C50912 Malignant neoplasm of unspecified site of left female breast: Secondary | ICD-10-CM

## 2017-12-17 NOTE — Patient Instructions (Signed)
The patient is aware to call back for any questions or concerns.  

## 2017-12-17 NOTE — Progress Notes (Signed)
Patient ID: Alicia [name], female   DOB: [birth year], 67 y.o.   MRN: [id number]  Chief Complaint  Patient presents with  . Routine Post Op    Left breast lumpectomy    HPI Alicia [name] is a 67 y.o. female. Here today for follow up Left breast lumpectomy. Continues to have some soreness.  HPI  Past Medical History:  Diagnosis Date  . Anxiety   . Cancer ([place]) 07/2017   left breast  . Depression   . Hypertension   . Hypothyroidism   . Rapid heart rate   . Thyroid disease     Past Surgical History:  Procedure Laterality Date  . AXILLARY LYMPH NODE BIOPSY Left 07/16/2017   METASTATIC MAMMARY CARCINOMA  . BREAST BIOPSY Left 07/16/2017   [place] bx of left breast mass and left breast LN.  INVASIVE MAMMARY CARCINOMA, NO SPECIAL TYPE.   Marland Kitchen BREAST EXCISIONAL BIOPSY Right 2001  . BREAST LUMPECTOMY Right    2001  . BREAST LUMPECTOMY WITH SENTINEL LYMPH NODE BIOPSY Left 12/06/2017   Procedure: BREAST LUMPECTOMY WITH SENTINEL LYMPH NODE BX;  Surgeon: Robert Bellow, MD;  Location: [facility] ORS;  Service: General;  Laterality: Left;  . COLONOSCOPY    . PORTACATH PLACEMENT Right 08/07/2017   Procedure: INSERTION PORT-A-CATH;  Surgeon: Robert Bellow, MD;  Location: [facility] ORS;  Service: General;  Laterality: Right;  . TONSILLECTOMY      Family History  Problem Relation Age of Onset  . Stroke Mother   . Thyroid disease Mother   . Renal Disease Mother   . Stroke Father   . Heart attack Father   . Sudden death Father 68       suicide  . Anuerysm Brother   . Breast cancer Neg Hx     Social History Social History   Tobacco Use  . Smoking status: Current Some Day Smoker    Packs/day: 0.25    Years: 30.00    Pack years: 7.50    Types: Cigarettes  . Smokeless tobacco: Never Used  Substance Use Topics  . Alcohol use: Yes    Comment: occasional q 68mo   . Drug use: Never    Allergies  Allergen Reactions  . Sulfa Antibiotics Diarrhea    Current Outpatient Medications   Medication Sig Dispense Refill  . acetaminophen (TYLENOL) 500 MG tablet Take 1,000 mg by mouth every 6 (six) hours as needed for moderate pain or headache.     Marland Kitchen acidophilus (RISAQUAD) CAPS capsule Take 1 capsule by mouth daily. (Patient not taking: Reported on 11/29/2017) 30 capsule 0  . albuterol (PROVENTIL HFA;VENTOLIN HFA) 108 (90 Base) MCG/ACT inhaler Inhale 2 puffs into the lungs every 6 (six) hours as needed for wheezing or shortness of breath. 1 Inhaler 2  . ALPRAZolam (XANAX) 0.25 MG tablet Take 0.25 mg by mouth 2 (two) times daily.     Marland Kitchen amLODipine (NORVASC) 5 MG tablet Take 5 mg by mouth daily.    Marland Kitchen aspirin EC 81 MG tablet Take 81 mg by mouth daily.    Marland Kitchen HYDROcodone-acetaminophen (NORCO/VICODIN) 5-325 MG tablet Take 1 tablet by mouth every 4 (four) hours as needed for moderate pain. 20 tablet 0  . levothyroxine (SYNTHROID, LEVOTHROID) 100 MCG tablet Take 100 mcg by mouth daily before breakfast.    . lidocaine-prilocaine (EMLA) cream Apply to affected area once 30 g 3  . loratadine (CLARITIN) 10 MG tablet Take 10 mg by mouth daily as needed for allergies.    Marland Kitchen  Lysine 500 MG CAPS Take 500 mg by mouth daily as needed (for fever blister).     . ondansetron (ZOFRAN) 8 MG tablet Take 1 tablet (8 mg total) by mouth 2 (two) times daily as needed. Start on the third day after chemotherapy. (Patient not taking: Reported on 11/29/2017) 30 tablet 2  . Potassium 99 MG TABS Take 99 mg by mouth daily.    . prochlorperazine (COMPAZINE) 10 MG tablet TAKE 1 TABLET(10 MG) BY MOUTH EVERY 6 HOURS AS NEEDED FOR NAUSEA OR VOMITING (Patient not taking: Reported on 11/29/2017) 360 tablet 2  . traZODone (DESYREL) 50 MG tablet Take 50 mg by mouth at bedtime.    . valACYclovir (VALTREX) 1000 MG tablet Take 1 tablet (1,000 mg total) by mouth 2 (two) times daily. (Patient not taking: Reported on 11/29/2017) 14 tablet 0   No current facility-administered medications for this visit.     Review of Systems Review  of Systems  Constitutional: Negative.   Respiratory: Negative.   Cardiovascular: Negative.   Skin: Negative.     There were no vitals taken for this visit.  Physical Exam Physical Exam  Constitutional: She is oriented to person, place, and time. She appears well-developed and well-nourished.  HENT:  Mouth/Throat: Oropharynx is clear and moist.  Eyes: Conjunctivae are normal.  Neck: Normal range of motion.  Cardiovascular: Normal rate, regular rhythm and normal heart sounds.  Pulmonary/Chest: Effort normal and breath sounds normal.  Drain removed  Neurological: She is alert and oriented to person, place, and time.  Skin: Skin is warm and dry.  Psychiatric: Her behavior is normal.    Data Reviewed DIAGNOSIS:  A. LYMPH NODE, LEFT AXILLARY; EXCISION:  -MACROMETASTATIC CARCINOMA, MEASURING AT LEAST 14 MM GREATEST DIMENSION,  WITH EXTENSIVE NECROSIS AND EXTRACAPSULAR EXTENSION (1/1).   B. BREAST, LEFT AT 3:00; WIDE EXCISION:  -INVASIVE MAMMARY CARCINOMA, NO SPECIAL TYPE.  - METALLIC CLIP AND BIOPSY SITE IDENTIFIED.  - SEE CANCER SUMMARY.   C. AXILLARY CONTENTS, LEFT; DISSECTION:  - FIVE OF SIX LYMPH NODES INVOLVED BY MACROMETASTATIC CARCINOMA,  MEASURING UP TO 8 MM IN GREATEST DIMENSION, WITH EXTRACAPSULAR EXTENSION  (5/6).  - MULTIPLE SEPARATE TUMOR DEPOSITS.   CANCER CASE SUMMARY: INVASIVE CARCINOMA OF THE BREAST  Procedure: Excision  Specimen Laterality: Left  Tumor Size: 43 mm  Histologic Type: Invasive carcinoma of no special type  Histologic Grade ([place] Histologic Score)            Glandular (Acinar)/Tubular Differentiation: Score 2            Nuclear Pleomorphism: Score 2            Mitotic Rate: Score 1            Overall Grade: Grade 2  Ductal Carcinoma In Situ (DCIS): Present   Margins:            Invasive Carcinoma Margins: Positive for invasive  Carcinoma. Anterior/superficial, superior, and  inferior,  multifocal             DCIS Margins: Uninvolved by DCIS            Distance from closest margin: 1 mm            Specify closest margin: Superior   Regional Lymph Nodes: Involved by tumor cells            Number of Lymph Nodes with Macrometastases (>2 mm):  6            Number  of Lymph Nodes with Micrometastases (>0.2 mm  to 2 mm and/or >200 cells): 0  Number of Lymph Nodes with Isolated Tumor Cells (=0.2 mm or =200 cells):  0  Size of Largest Metastatic Deposit: 14 mm  Extranodal Extension: Present  Number of Lymph Nodes Examined: 7  Number of Sentinel Nodes Examined: 1  Treatment Effect in the Breast: No definite response to presurgical  therapy in the invasive carcinoma  Treatment Effect in the Lymph Nodes: No definite response to presurgical  therapy in metastatic carcinoma  Residual Cancer Burden (RCB) from MD [place] calculator (website  below):    Primary Tumor Bed       Primary tumor bed: 43 mm x 24 mm       Overall cancer cellularity: 30 %       Percentage of cancer that is in situ: 10 %    Lymph nodes       Number of lymph nodes positive for metastasis: 6       Diameter of largest metastasis: 14 mm    Residual Cancer Burden: 3.939    Residual Cancer Burden Class: RCB-III  ypT2 pN2a (sn)    Assessment    Wide excision of the left breast with multiple positive margins.  Extensive nodal disease, extranodal extension.    Plan   [facility] drain was removed without incident as volumes were less than 30 cc/day for the last 5 days.  Gauze and Tegaderm dressing applied.  The pathology results were reviewed with the patient and her daughter-in-law Alicia [name].  Indications for reexcision or consideration of mastectomy discussed.  Strong possibility of no additional disease on excision, but the possibility that reexcision would not clear the margins and mastectomy  might be required as the third procedure.  The patient is going to consider her options.  She was advised that she may feel a little fluid collection under the axilla, but a less symptomatic will deal with this plan follow-up last week.        HPI, Physical Exam, Assessment and Plan have been scribed under the direction and in the presence of Robert Bellow, MD. Karie Fetch, RN  HPI, Physical Exam, Assessment and Plan have been scribed under the direction and in the presence of Robert Bellow, MD. Jonnie Finner, CMA  I have completed the exam and reviewed the above documentation for accuracy and completeness.  I agree with the above.  Haematologist has been used and any errors in dictation or transcription are unintentional.  Hervey Ard, M.D., F.A.C.S.  Alicia [name] 12/17/2017, 8:35 PM

## 2017-12-18 ENCOUNTER — Telehealth: Payer: Self-pay | Admitting: *Deleted

## 2017-12-18 NOTE — Telephone Encounter (Signed)
Patient's surgery has been scheduled for 12-23-17 at [facility] with Dr. Bary Castilla.  The patient is aware she will be contacted by the [facility] to complete a phone interview sometime in the near future.  It is okay for patient to continue an 81 mg aspirin once daily.   The patient is aware to call the office should she have further questions.

## 2017-12-18 NOTE — Telephone Encounter (Signed)
Patient called and has decided to proceed with a mastectomy, she is now ready to get surgery set up.

## 2017-12-18 NOTE — Telephone Encounter (Signed)
Surgery scheduling sheet on Dr. Dwyane Luo desk to complete.

## 2017-12-19 ENCOUNTER — Encounter
Admission: RE | Admit: 2017-12-19 | Discharge: 2017-12-19 | Disposition: A | Discharge status: Home / Self Care | Payer: Medicare Other | Source: Ambulatory Visit | Attending: General Surgery | Admitting: General Surgery

## 2017-12-19 ENCOUNTER — Other Ambulatory Visit: Payer: Self-pay

## 2017-12-19 ENCOUNTER — Other Ambulatory Visit: Payer: Self-pay | Admitting: General Surgery

## 2017-12-19 DIAGNOSIS — C50912 Malignant neoplasm of unspecified site of left female breast: Secondary | ICD-10-CM

## 2017-12-19 NOTE — Patient Instructions (Signed)
Your procedure is scheduled on: 12/23/17 Report to Day Surgery. [facility]OND FLOOR To find out your arrival time please call [phone] between 1PM - 3PM on 11/20/17  Remember: Instructions that are not followed completely may result in serious medical risk,  up to and including death, or upon the discretion of your surgeon and anesthesiologist your  surgery may need to be rescheduled.     _X__ 1. Do not eat food after midnight the night before your procedure.                 No gum chewing or hard candies. You may drink clear liquids up to 2 hours                 before you are scheduled to arrive for your surgery- DO not drink clear                 liquids within 2 hours of the start of your surgery.                 Clear Liquids include:  water, apple juice without pulp, clear carbohydrate                 drink such as Clearfast of Gatorade, Black Coffee or Tea (Do not add                 anything to coffee or tea).  __X__2.  On the morning of surgery brush your teeth with toothpaste and water, you                may rinse your mouth with mouthwash if you wish.  Do not swallow any toothpaste of mouthwash.     _X__ 3.  No Alcohol for 24 hours before or after surgery.   _X__ 4.  Do Not Smoke or use e-cigarettes For 24 Hours Prior to Your Surgery.                 Do not use any chewable tobacco products for at least 6 hours prior to                 surgery.  ____  5.  Bring all medications with you on the day of surgery if instructed.   _X___  6.  Notify your doctor if there is any change in your medical condition      (cold, fever, infections).     Do not wear jewelry, make-up, hairpins, clips or nail polish. Do not wear lotions, powders, or perfumes. You may wear deodorant. Do not shave 48 hours prior to surgery. Men may shave face and neck. Do not bring valuables to the hospital.    [facility] is not responsible for any belongings or  valuables.  Contacts, dentures or bridgework may not be worn into surgery. Leave your suitcase in the car. After surgery it may be brought to your room. For patients admitted to the hospital, discharge time is determined by your treatment team.   Patients discharged the day of surgery will not be allowed to drive home.    __X__ Take these medicines the morning of surgery with A SIP OF WATER:    1. ALPRAZOLAM  2. LEVOTHYROXINE  3. AMLODIPINE  4.  5.  6.  ____ Fleet Enema (as directed)   ____ Use CHG Soap as directed  ____ Use inhalers on the day of surgery  ____ Stop metformin 2 days prior to surgery  ____ Take 1/2 of usual insulin dose the night before surgery. No insulin the morning          of surgery.   ____ Stop Coumadin/Plavix/aspirin on   ____ Stop Anti-inflammatories on    ____ Stop supplements until after surgery.    ____ Bring C-Pap to the hospital.

## 2017-12-22 MED ORDER — CEFAZOLIN SODIUM-DEXTROSE 2-4 GM/100ML-% IV SOLN
2.0000 g | INTRAVENOUS | Status: AC
  Administered 2017-12-23: 2 g via INTRAVENOUS

## 2017-12-23 ENCOUNTER — Encounter: Payer: Self-pay | Admitting: *Deleted

## 2017-12-23 ENCOUNTER — Encounter
Admission: RE | Disposition: A | Discharge status: Home / Self Care | Payer: Self-pay | Source: Home / Self Care | Attending: General Surgery

## 2017-12-23 ENCOUNTER — Other Ambulatory Visit: Payer: Self-pay

## 2017-12-23 ENCOUNTER — Ambulatory Visit: Payer: Medicare Other | Admitting: Anesthesiology

## 2017-12-23 ENCOUNTER — Ambulatory Visit
Admission: RE | Admit: 2017-12-23 | Discharge: 2017-12-23 | Disposition: A | Discharge status: Home / Self Care | Payer: Medicare Other | Attending: General Surgery | Admitting: General Surgery

## 2017-12-23 DIAGNOSIS — I10 Essential (primary) hypertension: Secondary | ICD-10-CM | POA: Diagnosis not present

## 2017-12-23 DIAGNOSIS — C50412 Malignant neoplasm of upper-outer quadrant of left female breast: Secondary | ICD-10-CM | POA: Diagnosis not present

## 2017-12-23 DIAGNOSIS — F17210 Nicotine dependence, cigarettes, uncomplicated: Secondary | ICD-10-CM | POA: Insufficient documentation

## 2017-12-23 DIAGNOSIS — C50012 Malignant neoplasm of nipple and areola, left female breast: Secondary | ICD-10-CM | POA: Diagnosis not present

## 2017-12-23 DIAGNOSIS — C50912 Malignant neoplasm of unspecified site of left female breast: Secondary | ICD-10-CM | POA: Diagnosis not present

## 2017-12-23 DIAGNOSIS — Z79899 Other long term (current) drug therapy: Secondary | ICD-10-CM | POA: Diagnosis not present

## 2017-12-23 DIAGNOSIS — E039 Hypothyroidism, unspecified: Secondary | ICD-10-CM | POA: Diagnosis not present

## 2017-12-23 DIAGNOSIS — F419 Anxiety disorder, unspecified: Secondary | ICD-10-CM | POA: Diagnosis not present

## 2017-12-23 DIAGNOSIS — Z7982 Long term (current) use of aspirin: Secondary | ICD-10-CM | POA: Diagnosis not present

## 2017-12-23 DIAGNOSIS — F329 Major depressive disorder, single episode, unspecified: Secondary | ICD-10-CM | POA: Insufficient documentation

## 2017-12-23 HISTORY — PX: MASTECTOMY: SHX3

## 2017-12-23 HISTORY — PX: SIMPLE MASTECTOMY WITH AXILLARY SENTINEL NODE BIOPSY: SHX6098

## 2017-12-23 SURGERY — SIMPLE MASTECTOMY
Anesthesia: General | Laterality: Left

## 2017-12-23 MED ORDER — KETOROLAC TROMETHAMINE 30 MG/ML IJ SOLN
INTRAMUSCULAR | Status: DC | PRN
  Administered 2017-12-23: 15 mg via INTRAVENOUS

## 2017-12-23 MED ORDER — FENTANYL CITRATE (PF) 100 MCG/2ML IJ SOLN
INTRAMUSCULAR | Status: AC
  Filled 2017-12-23: qty 2

## 2017-12-23 MED ORDER — LACTATED RINGERS IV SOLN
INTRAVENOUS | Status: DC
  Administered 2017-12-23 (×2): via INTRAVENOUS

## 2017-12-23 MED ORDER — SUGAMMADEX SODIUM 200 MG/2ML IV SOLN
INTRAVENOUS | Status: AC
  Filled 2017-12-23: qty 2

## 2017-12-23 MED ORDER — FENTANYL CITRATE (PF) 100 MCG/2ML IJ SOLN
INTRAMUSCULAR | Status: DC | PRN
  Administered 2017-12-23 (×4): 25 ug via INTRAVENOUS

## 2017-12-23 MED ORDER — PHENYLEPHRINE HCL 10 MG/ML IJ SOLN
INTRAMUSCULAR | Status: DC | PRN
  Administered 2017-12-23 (×2): 100 ug via INTRAVENOUS

## 2017-12-23 MED ORDER — KETOROLAC TROMETHAMINE 30 MG/ML IJ SOLN
INTRAMUSCULAR | Status: AC
  Filled 2017-12-23: qty 1

## 2017-12-23 MED ORDER — PROPOFOL 10 MG/ML IV BOLUS
INTRAVENOUS | Status: DC | PRN
  Administered 2017-12-23: 170 mg via INTRAVENOUS

## 2017-12-23 MED ORDER — FAMOTIDINE 20 MG PO TABS
20.0000 mg | ORAL_TABLET | Freq: Once | ORAL | Status: AC
  Administered 2017-12-23: 20 mg via ORAL

## 2017-12-23 MED ORDER — ACETAMINOPHEN 10 MG/ML IV SOLN
INTRAVENOUS | Status: DC | PRN
  Administered 2017-12-23: 1000 mg via INTRAVENOUS

## 2017-12-23 MED ORDER — ACETAMINOPHEN 10 MG/ML IV SOLN
INTRAVENOUS | Status: AC
  Filled 2017-12-23: qty 100

## 2017-12-23 MED ORDER — PROMETHAZINE HCL 25 MG/ML IJ SOLN: 6.2500 mg | INTRAMUSCULAR | Status: DC | PRN

## 2017-12-23 MED ORDER — SEVOFLURANE IN SOLN
RESPIRATORY_TRACT | Status: AC
  Filled 2017-12-23: qty 250

## 2017-12-23 MED ORDER — MIDAZOLAM HCL 2 MG/2ML IJ SOLN
INTRAMUSCULAR | Status: DC | PRN
  Administered 2017-12-23: 2 mg via INTRAVENOUS

## 2017-12-23 MED ORDER — FENTANYL CITRATE (PF) 100 MCG/2ML IJ SOLN
INTRAMUSCULAR | Status: AC
  Administered 2017-12-23: 25 ug via INTRAVENOUS
  Filled 2017-12-23: qty 2

## 2017-12-23 MED ORDER — OXYCODONE HCL 5 MG/5ML PO SOLN: 5.0000 mg | Freq: Once | ORAL | Status: AC | PRN

## 2017-12-23 MED ORDER — FAMOTIDINE 20 MG PO TABS
ORAL_TABLET | ORAL | Status: AC
  Filled 2017-12-23: qty 1

## 2017-12-23 MED ORDER — MIDAZOLAM HCL 2 MG/2ML IJ SOLN
INTRAMUSCULAR | Status: AC
  Filled 2017-12-23: qty 2

## 2017-12-23 MED ORDER — OXYCODONE HCL 5 MG PO TABS
5.0000 mg | ORAL_TABLET | Freq: Once | ORAL | Status: AC | PRN
  Administered 2017-12-23: 5 mg via ORAL

## 2017-12-23 MED ORDER — ROCURONIUM BROMIDE 50 MG/5ML IV SOLN
INTRAVENOUS | Status: AC
  Filled 2017-12-23: qty 1

## 2017-12-23 MED ORDER — HYDROCODONE-ACETAMINOPHEN 5-325 MG PO TABS: 1.0000 | ORAL_TABLET | ORAL | Status: DC | PRN

## 2017-12-23 MED ORDER — PROPOFOL 10 MG/ML IV BOLUS
INTRAVENOUS | Status: AC
  Filled 2017-12-23: qty 20

## 2017-12-23 MED ORDER — MEPERIDINE HCL 50 MG/ML IJ SOLN: 6.2500 mg | INTRAMUSCULAR | Status: DC | PRN

## 2017-12-23 MED ORDER — CEFAZOLIN SODIUM-DEXTROSE 2-4 GM/100ML-% IV SOLN
INTRAVENOUS | Status: AC
  Filled 2017-12-23: qty 100

## 2017-12-23 MED ORDER — LIDOCAINE HCL (PF) 2 % IJ SOLN
INTRAMUSCULAR | Status: AC
  Filled 2017-12-23: qty 10

## 2017-12-23 MED ORDER — SUCCINYLCHOLINE CHLORIDE 20 MG/ML IJ SOLN
INTRAMUSCULAR | Status: AC
  Filled 2017-12-23: qty 1

## 2017-12-23 MED ORDER — EPHEDRINE SULFATE 50 MG/ML IJ SOLN
INTRAMUSCULAR | Status: DC | PRN
  Administered 2017-12-23 (×2): 10 mg via INTRAVENOUS

## 2017-12-23 MED ORDER — GABAPENTIN 300 MG PO CAPS
ORAL_CAPSULE | ORAL | Status: AC
  Filled 2017-12-23: qty 1

## 2017-12-23 MED ORDER — OXYCODONE HCL 5 MG PO TABS
ORAL_TABLET | ORAL | Status: AC
  Filled 2017-12-23: qty 1

## 2017-12-23 MED ORDER — GABAPENTIN 300 MG PO CAPS
300.0000 mg | ORAL_CAPSULE | ORAL | Status: AC
  Administered 2017-12-23: 300 mg via ORAL

## 2017-12-23 MED ORDER — ONDANSETRON HCL 4 MG/2ML IJ SOLN
INTRAMUSCULAR | Status: DC | PRN
  Administered 2017-12-23: 4 mg via INTRAVENOUS

## 2017-12-23 MED ORDER — FENTANYL CITRATE (PF) 100 MCG/2ML IJ SOLN
25.0000 ug | INTRAMUSCULAR | Status: DC | PRN
  Administered 2017-12-23 (×5): 25 ug via INTRAVENOUS

## 2017-12-23 SURGICAL SUPPLY — 54 items
APPLIER CLIP 11 MED OPEN (CLIP)
APPLIER CLIP 13 LRG OPEN (CLIP)
BINDER BREAST LRG (GAUZE/BANDAGES/DRESSINGS) IMPLANT
BINDER BREAST MEDIUM (GAUZE/BANDAGES/DRESSINGS) IMPLANT
BINDER BREAST XLRG (GAUZE/BANDAGES/DRESSINGS) IMPLANT
BINDER BREAST XXLRG (GAUZE/BANDAGES/DRESSINGS) ×1 IMPLANT
BLADE PHOTON ILLUMINATED (MISCELLANEOUS) ×1 IMPLANT
BLADE SURG 15 STRL SS SAFETY (BLADE) ×2 IMPLANT
BULB RESERV EVAC DRAIN JP 100C (MISCELLANEOUS) ×2 IMPLANT
CANISTER SUCT 1200ML W/VALVE (MISCELLANEOUS) ×2 IMPLANT
CHLORAPREP W/TINT 26ML (MISCELLANEOUS) ×2 IMPLANT
CLIP APPLIE 11 MED OPEN (CLIP) IMPLANT
CLIP APPLIE 13 LRG OPEN (CLIP) IMPLANT
CNTNR SPEC 2.5X3XGRAD LEK (MISCELLANEOUS)
CONT SPEC 4OZ STER OR WHT (MISCELLANEOUS)
CONTAINER SPEC 2.5X3XGRAD LEK (MISCELLANEOUS) ×3 IMPLANT
COVER PROBE FLX POLY STRL (MISCELLANEOUS) ×1 IMPLANT
COVER WAND RF STERILE (DRAPES) ×2 IMPLANT
DEVICE DUBIN SPECIMEN MAMMOGRA (MISCELLANEOUS) IMPLANT
DRAIN CHANNEL JP 15F RND 16 (MISCELLANEOUS) ×2 IMPLANT
DRAPE LAPAROTOMY TRNSV 106X77 (MISCELLANEOUS) ×2 IMPLANT
DRSG GAUZE FLUFF 36X18 (GAUZE/BANDAGES/DRESSINGS) ×5 IMPLANT
DRSG TELFA 3X8 NADH (GAUZE/BANDAGES/DRESSINGS) ×4 IMPLANT
ELECT CAUTERY BLADE TIP 2.5 (TIP)
ELECT REM PT RETURN 9FT ADLT (ELECTROSURGICAL) ×2
ELECTRODE CAUTERY BLDE TIP 2.5 (TIP) ×1 IMPLANT
ELECTRODE REM PT RTRN 9FT ADLT (ELECTROSURGICAL) ×1 IMPLANT
GAUZE SPONGE 4X4 12PLY STRL (GAUZE/BANDAGES/DRESSINGS) ×1 IMPLANT
GLOVE BIO SURGEON STRL SZ7.5 (GLOVE) ×4 IMPLANT
GLOVE INDICATOR 8.0 STRL GRN (GLOVE) ×4 IMPLANT
GOWN STRL REUS W/ TWL LRG LVL3 (GOWN DISPOSABLE) ×2 IMPLANT
GOWN STRL REUS W/TWL LRG LVL3 (GOWN DISPOSABLE) ×3
KIT TURNOVER KIT A (KITS) ×2 IMPLANT
LABEL OR SOLS (LABEL) ×2 IMPLANT
PACK BASIN MINOR ARMC (MISCELLANEOUS) ×2 IMPLANT
PAD DRESSING TELFA 3X8 NADH (GAUZE/BANDAGES/DRESSINGS) ×1 IMPLANT
PIN SAFETY STRL (MISCELLANEOUS) ×2 IMPLANT
RETRACTOR RING XSMALL (MISCELLANEOUS) IMPLANT
RTRCTR WOUND ALEXIS 13CM XS SH (MISCELLANEOUS)
SHEARS FOC LG CVD HARMONIC 17C (MISCELLANEOUS) IMPLANT
SLEVE PROBE SENORX GAMMA FIND (MISCELLANEOUS) ×1 IMPLANT
SPONGE LAP 18X18 RF (DISPOSABLE) ×2 IMPLANT
STRIP CLOSURE SKIN 1/2X4 (GAUZE/BANDAGES/DRESSINGS) ×4 IMPLANT
SUT ETHILON 3-0 FS-10 30 BLK (SUTURE) ×2
SUT SILK 2 0 (SUTURE) ×1
SUT SILK 2-0 30XBRD TIE 12 (SUTURE) ×1 IMPLANT
SUT VIC AB 2-0 CT1 27 (SUTURE) ×2
SUT VIC AB 2-0 CT1 TAPERPNT 27 (SUTURE) ×3 IMPLANT
SUT VIC AB 3-0 SH 27 (SUTURE) ×1
SUT VIC AB 3-0 SH 27X BRD (SUTURE) ×1 IMPLANT
SUT VICRYL+ 3-0 144IN (SUTURE) ×2 IMPLANT
SUTURE EHLN 3-0 FS-10 30 BLK (SUTURE) ×1 IMPLANT
SWABSTK COMLB BENZOIN TINCTURE (MISCELLANEOUS) ×2 IMPLANT
TAPE TRANSPORE STRL 2 31045 (GAUZE/BANDAGES/DRESSINGS) ×1 IMPLANT

## 2017-12-23 NOTE — H&P (Signed)
No change in clinical exam or history since last week surgery.  Given her options for repeat excision versus mastectomy the patient is decided to proceed to mastectomy.

## 2017-12-23 NOTE — Op Note (Signed)
Preoperative diagnosis: Left breast cancer, positive margins post wide excision.  Desire for mastectomy.  Postoperative diagnosis: Same.  Operative procedure: Left simple mastectomy.  Operating Surgeon: Charlie Pitter, MD.  Anesthesia: General by LMA.  Estimated blood loss: 50 cc.  Fluid replacement: 400 cc crystalloid.  Clinical note: This 68 year old man underwent neoadjuvant chemotherapy for a left breast cancer.  She recently underwent wide excision with a residual 4.3 cm tumor, 30% cellularity.  The anterior superficial and inferior margins were positive.  Given options for reexcision versus mastectomy the patient is chosen the latter.  The patient received Ancef prior to the procedure.  SCD stockings for DVT prevention.  Operative note: With the patient under adequate general anesthesia the left breast and chest was cleansed with ChloraPrep and draped.  An elliptical incision was outlined to encompass the previous wide excision site at 3 o'clock position.  This incision did come to within about 2 cm of the axillary incision.  The skin was incised sharply and remaining dissection completed with cutting cautery for the dermal lesion and then the photon blade for flap elevation.  Flaps were elevated to the sternum medially, clavicle superiorly, serratus muscle laterally and rectus fascia inferiorly.  The breast was elevated off the underlying pectoralis muscle taking the fascia of that muscle with the specimen.  It was orientated and sent fresh to pathology.  Some nodularity in the area of the old biopsy site was noted, final diagnosis deferred to permanent sections.  After achieving hemostasis with 3-0 Vicryl ties and electrocautery a 15 [place] Blake drain was brought through the lower inferior flap and anchored into position with a 3-0 nylon suture.  Skin flaps were approximated with a running 2-0 Vicryl suture in a deep dermal layer in 2 segments.  Benzoin Steri-Strips followed by Telfa,  fluff gauze and a compressive wrap were applied.  The patient tolerated the procedure well and was taken to the recovery room in stable condition.

## 2017-12-23 NOTE — Anesthesia Post-op Follow-up Note (Signed)
Anesthesia QCDR form completed.        

## 2017-12-23 NOTE — Discharge Instructions (Signed)
AMBULATORY SURGERY  DISCHARGE INSTRUCTIONS   1) The drugs that you were given will stay in your system until tomorrow so for the next 24 hours you should not:  A) Drive an automobile B) Make any legal decisions C) Drink any alcoholic beverage   2) You may resume regular meals tomorrow.  Today it is better to start with liquids and gradually work up to solid foods.  You may eat anything you prefer, but it is better to start with liquids, then soup and crackers, and gradually work up to solid foods.   3) Please notify your doctor immediately if you have any unusual bleeding, trouble breathing, redness and pain at the surgery site, drainage, fever, or pain not relieved by medication. Follow JP drain instructions and record amount.  Bring with you to the office  [phone] for main hospital number

## 2017-12-23 NOTE — Progress Notes (Deleted)
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  INTERVAL HISTORY: Patient returns to clinic today for further evaluation and reconsideration of cycle 5 of dose reduced Taxol.  Despite skipping treatment last week, patient states her peripheral neuropathy is significantly worse. She continues to have chronic weakness and fatigue. She continues to be highly anxious. She has no other neurologic complaints. She has a good appetite and denies weight loss. She has no chest pain or shortness of breath.  She denies any nausea, vomiting, constipation, or diarrhea.  She has no urinary complaints.  Patient offers no further specific complaints today.  REVIEW OF SYSTEMS:   Review of Systems  Constitutional: Positive for malaise/fatigue. Negative for fever and weight loss.  Respiratory: Negative.  Negative for cough and shortness of breath.   Cardiovascular: Negative.  Negative for chest pain and leg swelling.  Gastrointestinal: Negative.  Negative for abdominal pain and constipation.  Genitourinary: Negative.  Negative for dysuria and urgency.  Musculoskeletal: Negative.  Negative for back pain.  Skin: Negative.  Negative for rash.  Neurological: Positive for tingling, sensory change and weakness. Negative for dizziness, focal weakness and headaches.  Psychiatric/Behavioral: The patient is nervous/anxious.     As per HPI. Otherwise, a complete review of systems is negative.  PAST MEDICAL HISTORY: Past Medical History:  Diagnosis Date  . Anxiety   . Cancer ([place]) 07/2017   left breast  . Depression   . Hypertension   .  Hypothyroidism   . Rapid heart rate   . Thyroid disease     PAST SURGICAL HISTORY: Past Surgical History:  Procedure Laterality Date  . AXILLARY LYMPH NODE BIOPSY Left 07/16/2017   METASTATIC MAMMARY CARCINOMA  . BREAST BIOPSY Left 07/16/2017   [place] bx of left breast mass and left breast LN.  INVASIVE MAMMARY CARCINOMA, NO SPECIAL TYPE.   Marland Kitchen BREAST EXCISIONAL BIOPSY Right 2001  . BREAST LUMPECTOMY Right    2001  . BREAST LUMPECTOMY WITH SENTINEL LYMPH NODE BIOPSY Left 12/06/2017   Procedure: BREAST LUMPECTOMY WITH SENTINEL LYMPH NODE BX;  Surgeon: Alicia Bellow, MD;  Location: [facility] ORS;  Service: General;  Laterality: Left;  . COLONOSCOPY    . PORTACATH PLACEMENT Right 08/07/2017   Procedure: INSERTION PORT-A-CATH;  Surgeon: Alicia Bellow, MD;  Location: [facility] ORS;  Service: General;  Laterality: Right;  . TONSILLECTOMY      FAMILY HISTORY: Family History  Problem Relation Age of Onset  . Stroke Mother   . Thyroid disease Mother   . Renal Disease Mother   . Stroke Father   . Heart attack Father   . Sudden death Father 41       suicide  . Anuerysm Brother   . Breast cancer Neg Hx     ADVANCED DIRECTIVES (Y/N):  N  HEALTH MAINTENANCE: Social History   Tobacco Use  . Smoking status: Current Some Day Smoker    Packs/day: 0.25    Years: 30.00    Pack years: 7.50    Types: Cigarettes  . Smokeless tobacco: Never Used  Substance Use Topics  . Alcohol use: Yes    Comment: occasional q 74mo  . Drug  use: Never     Colonoscopy:  PAP:  Bone density:  Lipid panel:  Allergies  Allergen Reactions  . Sulfa Antibiotics Diarrhea    Current Outpatient Medications  Medication Sig Dispense Refill  . acetaminophen (TYLENOL) 500 MG tablet Take 1,000 mg by mouth every 6 (six) hours as needed for moderate pain or headache.     Marland Kitchen acidophilus (RISAQUAD) CAPS capsule Take 1 capsule by mouth daily. (Patient not taking: Reported on 11/29/2017) 30 capsule 0  . albuterol  (PROVENTIL HFA;VENTOLIN HFA) 108 (90 Base) MCG/ACT inhaler Inhale 2 puffs into the lungs every 6 (six) hours as needed for wheezing or shortness of breath. 1 Inhaler 2  . ALPRAZolam (XANAX) 0.25 MG tablet Take 0.25 mg by mouth 2 (two) times daily.     Marland Kitchen amLODipine (NORVASC) 5 MG tablet Take 5 mg by mouth daily.    Marland Kitchen aspirin EC 81 MG tablet Take 81 mg by mouth daily.    Marland Kitchen HYDROcodone-acetaminophen (NORCO/VICODIN) 5-325 MG tablet Take 1 tablet by mouth every 4 (four) hours as needed for moderate pain. 20 tablet 0  . levothyroxine (SYNTHROID, LEVOTHROID) 100 MCG tablet Take 100 mcg by mouth daily before breakfast.    . lidocaine-prilocaine (EMLA) cream Apply to affected area once 30 g 3  . loratadine (CLARITIN) 10 MG tablet Take 10 mg by mouth daily as needed for allergies.    . Lysine 500 MG CAPS Take 500 mg by mouth daily as needed (for fever blister).     . ondansetron (ZOFRAN) 8 MG tablet Take 1 tablet (8 mg total) by mouth 2 (two) times daily as needed. Start on the third day after chemotherapy. (Patient not taking: Reported on 11/29/2017) 30 tablet 2  . Potassium 99 MG TABS Take 99 mg by mouth daily.    . prochlorperazine (COMPAZINE) 10 MG tablet TAKE 1 TABLET(10 MG) BY MOUTH EVERY 6 HOURS AS NEEDED FOR NAUSEA OR VOMITING (Patient not taking: Reported on 11/29/2017) 360 tablet 2  . traZODone (DESYREL) 50 MG tablet Take 50 mg by mouth at bedtime.    . valACYclovir (VALTREX) 1000 MG tablet Take 1 tablet (1,000 mg total) by mouth 2 (two) times daily. (Patient not taking: Reported on 11/29/2017) 14 tablet 0   No current facility-administered medications for this visit.    Facility-Administered Medications Ordered in Other Visits  Medication Dose Route Frequency Provider Last Rate Last Dose  . ceFAZolin (ANCEF) IVPB 2g/100 mL premix  2 g Intravenous On Call to OR Byrnett, Forest Gleason, MD        OBJECTIVE: There were no vitals filed for this visit.   There is no height or weight on file to  calculate BMI.    ECOG FS:0 - Asymptomatic  General: Well-developed, well-nourished, no acute distress. Eyes: Pink conjunctiva, anicteric sclera. HEENT: Normocephalic, moist mucous membranes. Breast: Left breast mass reduced in size. Lungs: Clear to auscultation bilaterally. Heart: Regular rate and rhythm. No rubs, murmurs, or gallops. Abdomen: Soft, nontender, nondistended. No organomegaly noted, normoactive bowel sounds. Musculoskeletal: No edema, cyanosis, or clubbing. Neuro: Alert, answering all questions appropriately. Cranial nerves grossly intact. Skin: No rashes or petechiae noted. Psych: Normal affect.  LAB RESULTS:  Lab Results  Component Value Date   NA 135 11/14/2017   K 3.8 11/14/2017   CL 104 11/14/2017   CO2 22 11/14/2017   GLUCOSE 99 11/14/2017   BUN 12 11/14/2017   CREATININE 0.87 11/14/2017   CALCIUM 9.7 11/14/2017   PROT 7.3 11/14/2017  ALBUMIN 4.1 11/14/2017   AST 26 11/14/2017   ALT 26 11/14/2017   ALKPHOS 127 (H) 11/14/2017   BILITOT 0.8 11/14/2017   GFRNONAA >60 11/14/2017   GFRAA >60 11/14/2017    Lab Results  Component Value Date   WBC 6.0 11/14/2017   NEUTROABS 4.2 11/14/2017   HGB 10.8 (L) 11/14/2017   HCT 34.0 (L) 11/14/2017   MCV 95.0 11/14/2017   PLT 292 11/14/2017     STUDIES: Nm Sentinel Node Injection  Result Date: 12/06/2017 CLINICAL DATA:  Left breast cancer. EXAM: NUCLEAR MEDICINE BREAST LYMPHOSCINTIGRAPHY TECHNIQUE: Intradermal injection of radiopharmaceutical was performed at the 12 o'clock, 3 o'clock, 6 o'clock, and 9 o'clock positions around the left nipple. The patient was then sent to the operating room where the sentinel node(s) were identified and removed by the surgeon. RADIOPHARMACEUTICALS:  Total of 1 mCi Millipore-filtered Technetium-50msulfur colloid, injected in four aliquots of 0.25 mCi each. IMPRESSION: Uncomplicated intradermal injection of a total of 0.716 mCi Technetium-914mulfur colloid for purposes of  sentinel node identification. Electronically Signed   By: HeKathreen Devoid On: 12/06/2017 11:44   Mm Breast Surgical Specimen  Result Date: 12/06/2017 CLINICAL DATA:  Biopsy proven invasive mammary carcinoma in the left breast. EXAM: SPECIMEN RADIOGRAPH OF THE LEFT BREAST COMPARISON:  Previous exam(s). FINDINGS: Status post excision of the left breast. Specimen radiograph shows the marker clip and mass are present in the tissue sample. IMPRESSION: Specimen radiograph of the left breast. Electronically Signed   By: DiLillia Mountain.D.   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  PLAN:    1. 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  Case was also discussed extensively at case conference. Given the size and the stage of patient's malignancy, she will benefit from neoadjuvant chemotherapy using Adriamycin, Cytoxan, and Taxol.  Patient will likely require adjuvant XRT followed by an aromatase inhibitor for 5 years at the conclusion of her treatments.  CT scan of the chest, abdomen, pelvis completed on August 01, 2017 did not reveal any metastatic disease.  MUGA scan on August 02, 2017 revealed EF of 61% which is adequate to proceed with treatment.  Because patient's peripheral neuropathy is worse and affecting her day-to-day activity will discontinue Taxol at this time.  Patient expressed understanding that this possibly could increase her risk of recurrence.  Patient will now proceed with surgical resection and referral has been sent back to surgery.  Return to clinic after her surgery in 6 weeks for further evaluation and additional treatment planning.  2.  Anemia: Patient's hemoglobin is slightly improved to 10.8, monitor. 3.  Peripheral neuropathy: Discontinue Taxol as above.  I spent  a total of 30 minutes face-to-face with the patient of which greater than 50% of the visit was spent in counseling and coordination of care as detailed above.  Patient expressed understanding and was in agreement with this plan. She also understands that She can call clinic at any time with any questions, concerns, or complaints.   Cancer Staging Breast cancer, stage 2, left ([place]aging form: Breast, AJCC 8th Edition - Clinical stage from 07/30/2017: Stage IIA (cT2, cN1, cM0, G2, ER+, PR+, HER2-) - Signed by FiLloyd HugerMD on 07/30/2017   TiLloyd HugerMD   12/23/2017 12:09  AM     

## 2017-12-23 NOTE — Anesthesia Procedure Notes (Signed)
Procedure Name: LMA Insertion Date/Time: 12/23/2017 10:04 AM Performed by: Allean Found, CRNA Pre-anesthesia Checklist: Patient identified, Patient being monitored, Timeout performed, Emergency Drugs available and Suction available Patient Re-evaluated:Patient Re-evaluated prior to induction Oxygen Delivery Method: Circle system utilized Preoxygenation: Pre-oxygenation with 100% oxygen Induction Type: IV induction Ventilation: Mask ventilation without difficulty LMA: LMA inserted LMA Size: 4.0 Tube type: Oral Number of attempts: 1 Placement Confirmation: positive ETCO2 and breath sounds checked- equal and bilateral Tube secured with: Tape Dental Injury: Teeth and Oropharynx as per pre-operative assessment

## 2017-12-23 NOTE — Anesthesia Preprocedure Evaluation (Signed)
Anesthesia Evaluation  Patient identified by MRN, date of birth, ID band Patient awake    Reviewed: Allergy & Precautions, NPO status , Patient's Chart, lab work & pertinent test results  History of Anesthesia Complications Negative for: history of anesthetic complications  Airway Mallampati: II  TM Distance: >3 FB Neck ROM: Full    Dental no notable dental hx.    Pulmonary neg sleep apnea, neg COPD, Current Smoker,    breath sounds clear to auscultation- rhonchi (-) wheezing      Cardiovascular hypertension, Pt. on medications (-) CAD, (-) Past MI, (-) Cardiac Stents and (-) CABG  Rhythm:Regular Rate:Normal - Systolic murmurs and - Diastolic murmurs    Neuro/Psych PSYCHIATRIC DISORDERS Anxiety Depression negative neurological ROS     GI/Hepatic negative GI ROS, Neg liver ROS,   Endo/Other  neg diabetesHypothyroidism   Renal/GU negative Renal ROS     Musculoskeletal negative musculoskeletal ROS (+)   Abdominal (+) + obese,   Peds  Hematology negative hematology ROS (+)   Anesthesia Other Findings Past Medical History: No date: Anxiety 07/2017: Cancer ([place])     Comment:  left breast No date: Depression No date: Hypertension No date: Hypothyroidism No date: Rapid heart rate No date: Thyroid disease   Reproductive/Obstetrics                             Anesthesia Physical Anesthesia Plan  ASA: II  Anesthesia Plan: General   Post-op Pain Management:    Induction: Intravenous  PONV Risk Score and Plan: 1 and Ondansetron and Midazolam  Airway Management Planned: LMA  Additional Equipment:   Intra-op Plan:   Post-operative Plan:   Informed Consent: I have reviewed the patients History and Physical, chart, labs and discussed the procedure including the risks, benefits and alternatives for the proposed anesthesia with the patient or authorized representative who has indicated  his/her understanding and acceptance.   Dental advisory given  Plan Discussed with: CRNA and Anesthesiologist  Anesthesia Plan Comments:         Anesthesia Quick Evaluation

## 2017-12-23 NOTE — Transfer of Care (Signed)
Immediate Anesthesia Transfer of Care Note  Patient: Willard Madrigal [facility]  Procedure(s) Performed: SIMPLE MASTECTOMY (Left )  Patient Location: PACU  Anesthesia Type:General  Level of Consciousness: awake  Airway & Oxygen Therapy: Patient Spontanous Breathing and Patient connected to face mask oxygen  Post-op Assessment: Report given to RN and Post -op Vital signs reviewed and stable  Post vital signs: Reviewed and stable  Last Vitals:  Vitals Value Taken Time  BP 112/52 12/23/2017 11:46 AM  Temp    Pulse 77 12/23/2017 11:48 AM  Resp    SpO2 100 % 12/23/2017 11:48 AM  Vitals shown include unvalidated device data.  Last Pain:  Vitals:   12/23/17 0858  TempSrc: Temporal  PainSc: 0-No pain         Complications: No apparent anesthesia complications

## 2017-12-23 NOTE — Anesthesia Postprocedure Evaluation (Signed)
Anesthesia Post Note  Patient: Alicia [name] [facility]  Procedure(s) Performed: SIMPLE MASTECTOMY (Left )  Patient location during evaluation: PACU Anesthesia Type: General Level of consciousness: awake and alert and oriented Pain management: pain level controlled Vital Signs Assessment: post-procedure vital signs reviewed and stable Respiratory status: spontaneous breathing, nonlabored ventilation and respiratory function stable Cardiovascular status: blood pressure returned to baseline and stable Postop Assessment: no signs of nausea or vomiting Anesthetic complications: no     Last Vitals:  Vitals:   12/23/17 1232 12/23/17 1247  BP: 97/60 (!) 105/52  Pulse: 75 73  Resp: 17 16  Temp:  (!) 36.3 C  SpO2: 93% 96%    Last Pain:  Vitals:   12/23/17 1247  TempSrc: Temporal  PainSc: 6                  [name]

## 2017-12-24 ENCOUNTER — Ambulatory Visit: Payer: Medicare Other | Admitting: General Surgery

## 2017-12-24 ENCOUNTER — Encounter: Payer: Self-pay | Admitting: General Surgery

## 2017-12-24 LAB — SURGICAL PATHOLOGY

## 2017-12-25 ENCOUNTER — Telehealth: Payer: Self-pay | Admitting: General Surgery

## 2017-12-25 NOTE — Telephone Encounter (Signed)
Notified all residual cancer removed.

## 2017-12-26 ENCOUNTER — Inpatient Hospital Stay: Payer: Medicare Other | Admitting: Oncology

## 2017-12-26 ENCOUNTER — Encounter: Payer: Self-pay | Admitting: General Surgery

## 2017-12-26 ENCOUNTER — Other Ambulatory Visit: Payer: Self-pay

## 2017-12-26 ENCOUNTER — Ambulatory Visit (INDEPENDENT_AMBULATORY_CARE_PROVIDER_SITE_OTHER): Payer: Medicare Other | Admitting: General Surgery

## 2017-12-26 ENCOUNTER — Inpatient Hospital Stay: Payer: Medicare Other

## 2017-12-26 VITALS — BP 132/80 | HR 73 | Temp 97.9°F | Ht 66.0 in | Wt 207.0 lb

## 2017-12-26 DIAGNOSIS — C50912 Malignant neoplasm of unspecified site of left female breast: Secondary | ICD-10-CM

## 2017-12-26 NOTE — Patient Instructions (Signed)
Follow up in one week

## 2017-12-26 NOTE — Progress Notes (Signed)
Patient ID: Alicia [name], female   DOB: 18-Oct-1950, 67 y.o.   MRN: [id number]  Chief Complaint  Patient presents with  . Routine Post Op    HPI Alicia [name] is a 67 y.o. female.  Here for postoperative left mastectomy on 12-23-17. She states she is sore and using Advil for pain.  Drain sheet present. She is here with her daughter in law, [place].  HPI  Past Medical History:  Diagnosis Date  . Anxiety   . Cancer ([place]) 07/2017   left breast  . Depression   . Hypertension   . Hypothyroidism   . Rapid heart rate   . Thyroid disease     Past Surgical History:  Procedure Laterality Date  . AXILLARY LYMPH NODE BIOPSY Left 07/16/2017   METASTATIC MAMMARY CARCINOMA  . BREAST BIOPSY Left 07/16/2017   [place] bx of left breast mass and left breast LN.  INVASIVE MAMMARY CARCINOMA, NO SPECIAL TYPE.   Marland Kitchen BREAST EXCISIONAL BIOPSY Right 2001  . BREAST LUMPECTOMY Right    2001  . BREAST LUMPECTOMY WITH SENTINEL LYMPH NODE BIOPSY Left 12/06/2017   Procedure: BREAST LUMPECTOMY WITH SENTINEL LYMPH NODE BX;  Surgeon: Robert Bellow, MD;  Location: [facility];  Service: General;  Laterality: Left;  . COLONOSCOPY    . PORTACATH PLACEMENT Right 08/07/2017   Procedure: INSERTION PORT-A-CATH;  Surgeon: Robert Bellow, MD;  Location: [facility];  Service: General;  Laterality: Right;  . SIMPLE MASTECTOMY WITH AXILLARY SENTINEL NODE BIOPSY Left 12/23/2017   Procedure: SIMPLE MASTECTOMY;  Surgeon: Robert Bellow, MD;  Location: [facility];  Service: General;  Laterality: Left;  . TONSILLECTOMY      Family History  Problem Relation Age of Onset  . Stroke Mother   . Thyroid disease Mother   . Renal Disease Mother   . Stroke Father   . Heart attack Father   . Sudden death Father 38       suicide  . Anuerysm Brother   . Breast cancer Neg Hx     Social History Social History   Tobacco Use  . Smoking status: Current Some Day Smoker    Packs/day: 0.25    Years: 30.00    Pack years:  7.50    Types: Cigarettes  . Smokeless tobacco: Never Used  Substance Use Topics  . Alcohol use: Yes    Comment: occasional q 40mo   . Drug use: Never    Allergies  Allergen Reactions  . Sulfa Antibiotics Diarrhea    Current Outpatient Medications  Medication Sig Dispense Refill  . acetaminophen (TYLENOL) 500 MG tablet Take 1,000 mg by mouth every 6 (six) hours as needed for moderate pain or headache.     . albuterol (PROVENTIL HFA;VENTOLIN HFA) 108 (90 Base) MCG/ACT inhaler Inhale 2 puffs into the lungs every 6 (six) hours as needed for wheezing or shortness of breath. 1 Inhaler 2  . ALPRAZolam (XANAX) 0.25 MG tablet Take 0.25 mg by mouth 2 (two) times daily.     Marland Kitchen amLODipine (NORVASC) 5 MG tablet Take 5 mg by mouth daily.    Marland Kitchen aspirin EC 81 MG tablet Take 81 mg by mouth daily.    Marland Kitchen HYDROcodone-acetaminophen (NORCO/VICODIN) 5-325 MG tablet Take 1 tablet by mouth every 4 (four) hours as needed for moderate pain. 20 tablet 0  . levothyroxine (SYNTHROID, LEVOTHROID) 100 MCG tablet Take 100 mcg by mouth daily before breakfast.    . lidocaine-prilocaine (EMLA) cream Apply to affected  area once 30 g 3  . loratadine (CLARITIN) 10 MG tablet Take 10 mg by mouth daily as needed for allergies.    . Lysine 500 MG CAPS Take 500 mg by mouth daily as needed (for fever blister).     . ondansetron (ZOFRAN) 8 MG tablet Take 1 tablet (8 mg total) by mouth 2 (two) times daily as needed. Start on the third day after chemotherapy. 30 tablet 2  . Potassium 99 MG TABS Take 99 mg by mouth daily.    . prochlorperazine (COMPAZINE) 10 MG tablet TAKE 1 TABLET(10 MG) BY MOUTH EVERY 6 HOURS AS NEEDED FOR NAUSEA OR VOMITING 360 tablet 2  . traZODone (DESYREL) 50 MG tablet Take 50 mg by mouth at bedtime.    . valACYclovir (VALTREX) 1000 MG tablet Take 1 tablet (1,000 mg total) by mouth 2 (two) times daily. 14 tablet 0  . acidophilus (RISAQUAD) CAPS capsule Take 1 capsule by mouth daily. (Patient not taking: Reported  on 12/26/2017) 30 capsule 0   No current facility-administered medications for this visit.     Review of Systems Review of Systems  Constitutional: Negative.   Respiratory: Positive for cough.   Cardiovascular: Negative.     Blood pressure 132/80, pulse 73, temperature 97.9 F (36.6 C), temperature source Skin, height 5\' 6"  (1.676 m), weight 207 lb (93.9 kg), SpO2 98 %.  Physical Exam Physical Exam Exam conducted with a chaperone present.  Neck:     Musculoskeletal: Neck supple.  Chest:       Comments: Mild bruising at incision  Skin:    General: Skin is warm and dry.  Neurological:     Mental Status: She is alert and oriented to person, place, and time.  Psychiatric:        Mood and Affect: Mood normal.     Data Reviewed A. BREAST, LEFT; SIMPLE MASTECTOMY:  - RESIDUAL INVASIVE MAMMARY CARCINOMA.  - RESIDUAL DUCTAL CARCINOMA IN SITU.  - INVASIVE CARCINOMA INVOLVES MUSCLE OF NIPPLE / AREOLA COMPLEX.  - DEEP MARGIN IS NEGATIVE FOR CARCINOMA.  - BIOPSY CAVITY.  - SKIN WITH SCAR.   Comment:  Sections demonstrate residual invasive mammary carcinoma in sections  anterior to the biopsy cavity with focal carcinoma involving the muscle  of the nipple / areola complex. The largest linear focus of residual  invasive carcinoma measures 15 mm. Residual ductal carcinoma in situ is  present in sections inferior to the biopsy cavity. Ductal carcinoma in  situ is also present in random sections of the upper inner and lower  inner quadrants. Based on the size of the invasive carcinoma in the  prior wide excision (43 mm) the presence of 15 mm of residual invasive  carcinoma in this specimen may correspond to an overall tumor size  greater than 50 mm which would correspond to AJCC ypT3. Correlation with  radiographic findings is required. These findings were discussed with  Dr. Bary Castilla on 12/24/2017.   Ultrasound exam prior to treatment in June, 2019: Targeted ultrasound is  performed, showing angulated spiculated mass at the palpable area left breast 2:30 o'clock 5 cm from nipple measuring 2.4 x 3.6 x 2.2 cm.  It appears that the pathologic size was significantly underestimated by imaging.  All lymph nodes removed at the time of her original surgery on December 06, 2017 were positive for metastatic disease without evidence of significant neoadjuvant effect.   Assessment    Doing well post mastectomy.    Plan  Follow up in one week. Continue drain care.  Drainage volumes are above 30 cc/day.  New dressing applied.  Patient may begin showering. The patient is aware to call back for any questions or new concerns.       HPI, Physical Exam, Assessment and Plan have been scribed under the direction and in the presence of Robert Bellow, MD. Karie Fetch, RN  I have completed the exam and reviewed the above documentation for accuracy and completeness.  I agree with the above.  Haematologist has been used and any errors in dictation or transcription are unintentional.  Hervey Ard, M.D., F.A.C.S.    Forest Gleason Lorinda Copland 12/28/2017, 10:18 AM

## 2018-01-02 ENCOUNTER — Other Ambulatory Visit: Payer: Self-pay

## 2018-01-02 ENCOUNTER — Ambulatory Visit (INDEPENDENT_AMBULATORY_CARE_PROVIDER_SITE_OTHER): Payer: Medicare Other | Admitting: General Surgery

## 2018-01-02 ENCOUNTER — Encounter: Payer: Self-pay | Admitting: General Surgery

## 2018-01-02 VITALS — BP 151/88 | HR 76 | Temp 95.1°F | Resp 16 | Ht 66.0 in | Wt 207.4 lb

## 2018-01-02 DIAGNOSIS — C50912 Malignant neoplasm of unspecified site of left female breast: Secondary | ICD-10-CM

## 2018-01-02 NOTE — Patient Instructions (Addendum)
Patient is to return to the office in 1 week with Dr.Davis.  Call the office with any questions or concerns.

## 2018-01-02 NOTE — Progress Notes (Signed)
Patient ID: Alicia [name], female   DOB: 04/29/1950, 67 y.o.   MRN: [id number]  Chief Complaint  Patient presents with  . Routine Post Op     one week f/u s/p left mastectomy done 12-23-17    HPI Alicia [name] is a 67 y.o. female here today for 1 week follow up for s/p for left mastectomy 12/23/17. Patient states she is having some pain and numbness. Patient states she is not feeling too well today. HPI  Past Medical History:  Diagnosis Date  . Anxiety   . Cancer ([place]) 07/2017   left breast  . Depression   . Hypertension   . Hypothyroidism   . Rapid heart rate   . Thyroid disease     Past Surgical History:  Procedure Laterality Date  . AXILLARY LYMPH NODE BIOPSY Left 07/16/2017   METASTATIC MAMMARY CARCINOMA  . BREAST BIOPSY Left 07/16/2017   [place] bx of left breast mass and left breast LN.  INVASIVE MAMMARY CARCINOMA, NO SPECIAL TYPE.   Marland Kitchen BREAST EXCISIONAL BIOPSY Right 2001  . BREAST LUMPECTOMY Right    2001  . BREAST LUMPECTOMY WITH SENTINEL LYMPH NODE BIOPSY Left 12/06/2017   Procedure: BREAST LUMPECTOMY WITH SENTINEL LYMPH NODE BX;  Surgeon: Robert Bellow, MD;  Location: [facility];  Service: General;  Laterality: Left;  . COLONOSCOPY    . PORTACATH PLACEMENT Right 08/07/2017   Procedure: INSERTION PORT-A-CATH;  Surgeon: Robert Bellow, MD;  Location: [facility];  Service: General;  Laterality: Right;  . SIMPLE MASTECTOMY WITH AXILLARY SENTINEL NODE BIOPSY Left 12/23/2017   Procedure: SIMPLE MASTECTOMY;  Surgeon: Robert Bellow, MD;  Location: [facility];  Service: General;  Laterality: Left;  . TONSILLECTOMY      Family History  Problem Relation Age of Onset  . Stroke Mother   . Thyroid disease Mother   . Renal Disease Mother   . Stroke Father   . Heart attack Father   . Sudden death Father 45       suicide  . Anuerysm Brother   . Breast cancer Neg Hx     Social History Social History   Tobacco Use  . Smoking status: Current Some Day Smoker   Packs/day: 0.25    Years: 30.00    Pack years: 7.50    Types: Cigarettes  . Smokeless tobacco: Never Used  Substance Use Topics  . Alcohol use: Yes    Comment: occasional q 17mo   . Drug use: Never    Allergies  Allergen Reactions  . Sulfa Antibiotics Diarrhea    Current Outpatient Medications  Medication Sig Dispense Refill  . acetaminophen (TYLENOL) 500 MG tablet Take 1,000 mg by mouth every 6 (six) hours as needed for moderate pain or headache.     Marland Kitchen acidophilus (RISAQUAD) CAPS capsule Take 1 capsule by mouth daily. 30 capsule 0  . albuterol (PROVENTIL HFA;VENTOLIN HFA) 108 (90 Base) MCG/ACT inhaler Inhale 2 puffs into the lungs every 6 (six) hours as needed for wheezing or shortness of breath. 1 Inhaler 2  . ALPRAZolam (XANAX) 0.25 MG tablet Take 0.25 mg by mouth 2 (two) times daily.     Marland Kitchen amLODipine (NORVASC) 5 MG tablet Take 5 mg by mouth daily.    Marland Kitchen aspirin EC 81 MG tablet Take 81 mg by mouth daily.    Marland Kitchen HYDROcodone-acetaminophen (NORCO/VICODIN) 5-325 MG tablet Take 1 tablet by mouth every 4 (four) hours as needed for moderate pain. 20 tablet 0  .  levothyroxine (SYNTHROID, LEVOTHROID) 100 MCG tablet Take 100 mcg by mouth daily before breakfast.    . lidocaine-prilocaine (EMLA) cream Apply to affected area once 30 g 3  . loratadine (CLARITIN) 10 MG tablet Take 10 mg by mouth daily as needed for allergies.    . Lysine 500 MG CAPS Take 500 mg by mouth daily as needed (for fever blister).     . ondansetron (ZOFRAN) 8 MG tablet Take 1 tablet (8 mg total) by mouth 2 (two) times daily as needed. Start on the third day after chemotherapy. 30 tablet 2  . Potassium 99 MG TABS Take 99 mg by mouth daily.    . prochlorperazine (COMPAZINE) 10 MG tablet TAKE 1 TABLET(10 MG) BY MOUTH EVERY 6 HOURS AS NEEDED FOR NAUSEA OR VOMITING 360 tablet 2  . traZODone (DESYREL) 50 MG tablet Take 50 mg by mouth at bedtime.    . valACYclovir (VALTREX) 1000 MG tablet Take 1 tablet (1,000 mg total) by mouth 2  (two) times daily. 14 tablet 0   No current facility-administered medications for this visit.     Review of Systems Review of Systems  Blood pressure (!) 151/88, pulse 76, temperature (!) 95.1 F (35.1 C), temperature source Temporal, resp. rate 16, height 5\' 6"  (1.676 m), weight 207 lb 6.4 oz (94.1 kg), SpO2 98 %.  Physical Exam Physical Exam Chest:    Lymphadenopathy:     Comments: No evidence of upper extremity lymphedema.       Data Reviewed Drainage volumes are improving, but still well above 45 cc/day.  Assessment    Doing well post salvage mastectomy.    Plan    The drain will remain in place.    The patient has an appointment scheduled with Dr. Grayland Ormond on January 20, 2018.  Patient is to return to the office in 1 week with Dr.Davis in my absence. HPI, Physical Exam, Assessment and Plan have been scribed under the direction and in the presence of Hervey Ard, Md.  Eudelia Bunch R. Bobette Mo, CMA  I have completed the exam and reviewed the above documentation for accuracy and completeness.  I agree with the above.  Haematologist has been used and any errors in dictation or transcription are unintentional.  Hervey Ard, M.D., F.A.C.S.  Forest Gleason Rachael Ferrie 01/03/2018, 6:39 AM

## 2018-01-09 ENCOUNTER — Ambulatory Visit (INDEPENDENT_AMBULATORY_CARE_PROVIDER_SITE_OTHER): Payer: Medicare Other | Admitting: Surgery

## 2018-01-09 ENCOUNTER — Encounter: Payer: Self-pay | Admitting: Surgery

## 2018-01-09 ENCOUNTER — Other Ambulatory Visit: Payer: Self-pay

## 2018-01-09 VITALS — BP 161/90 | HR 79 | Temp 97.5°F | Resp 12 | Ht 66.0 in | Wt 208.0 lb

## 2018-01-09 DIAGNOSIS — Z4889 Encounter for other specified surgical aftercare: Secondary | ICD-10-CM

## 2018-01-09 NOTE — Progress Notes (Signed)
Surgical Clinic Progress/Follow-up Note   HPI:  67 y.o. Female presents to clinic for subsequent post-op follow-up 17 Days s/p Left mastectomy ([facility], 12/23/2017) for positive margins following recent post-neoadjuvant Left breast lumpectomy with sentinel lymph node dissection, axillary lymph node dissection (12/06/2017). Patient reports she still is "not feeling well", but when asked focused questions to clarify, patient states incisional and axillary pain have much improved, for which she's only been taking occasional Tylenol prn with small amounts old brown blood on gauze, persistent Left axillary numbness, mild distal paresthesias at finger tips (unchanged since started neoadjuvant chemotherapy), and 30 - 50 mL clear yellow fluid in drain daily, no surrounding redness, purulent drainage, or LUE edema. She otherwise denies N/V, fever/chills, CP, or SOB.  Review of Systems:  Constitutional: denies fever/chills  problems  Respiratory: denies shortness of breath, wheezing  Cardiovascular: denies chest pain, palpitations Breast: pain, wound, drainage, and sensation as per interval history Gastrointestinal: denies abdominal pain, N/V, or diarrhea Skin: Denies any other rashes or skin discolorations except post-surgical wounds as per interval history  Vital Signs:  BP (!) 161/90   Pulse 79   Temp (!) 97.5 F (36.4 C) (Skin)   Resp 12   Ht 5\' 6"  (1.676 m)   Wt 208 lb (94.3 kg)   SpO2 98%   BMI 33.57 kg/m    Physical Exam:  Constitutional:  -- Overweight body habitus  -- Awake, alert, and oriented x3  Pulmonary:  -- No crackles -- Equal breath sounds bilaterally -- Breathing non-labored at rest Cardiovascular:  -- S1, S2 present  -- No pericardial rubs  Breast:  Non-tender to palpation with small amount of dried dark maroon/brown non-purulent blood at medial third of mastectomy wound, not associated with any underlying hematoma or peri-incisional erythema or purulent drainage --  Post-surgical drain well-secured with transparent yellow fluid in tubing and bulb Musculoskeletal / Integumentary:  -- Wounds or skin discoloration: None appreciated except post-surgical incisions as described above (Breast) -- Extremities: B/L UE and LE FROM, hands and feet warm, no edema (particularly no LUE lymphedema)  Imaging: No new pertinent imaging available for review  Assessment:  67 y.o. yo Female with a problem list including...  Patient Active Problem List   Diagnosis Date Noted  . Sepsis ([place]) 10/05/2017  . Breast cancer, stage 2, left ([place]) 07/24/2017  . Elevated troponin 02/17/2015    presents to clinic for post-op follow-up evaluation, doing overall okay/better 17 Days s/p Left mastectomy ([facility], 12/23/2017) for positive margins following recent post-neoadjuvant Left breast lumpectomy with sentinel lymph node dissection, axillary lymph node dissection (12/06/2017).  Plan:              - discussed exam findings              - okay to continue to shower, apply breast binder  - will keep surgically placed drain for now considering volume of drainage             - return to clinic next week to reassess for drain removal  - instructed to call office if any questions or concerns  All of the above recommendations were discussed with the patient, and all of patient's questions were answered to her expressed satisfaction.  -- Marilynne Drivers Rosana Hoes, MD, [place]: [facility] exceptional care. Office: [phone]

## 2018-01-09 NOTE — Patient Instructions (Signed)
The patient is aware to call back for any questions or new concerns.  Continue drain record sheet

## 2018-01-10 ENCOUNTER — Emergency Department: Payer: Medicare Other

## 2018-01-10 ENCOUNTER — Observation Stay
Admission: EM | Admit: 2018-01-10 | Discharge: 2018-01-12 | Disposition: A | Discharge status: Home / Self Care | Payer: Medicare Other | Attending: Internal Medicine | Admitting: Internal Medicine

## 2018-01-10 ENCOUNTER — Other Ambulatory Visit: Payer: Self-pay

## 2018-01-10 DIAGNOSIS — I2699 Other pulmonary embolism without acute cor pulmonale: Principal | ICD-10-CM | POA: Insufficient documentation

## 2018-01-10 DIAGNOSIS — F419 Anxiety disorder, unspecified: Secondary | ICD-10-CM | POA: Insufficient documentation

## 2018-01-10 DIAGNOSIS — Z8249 Family history of ischemic heart disease and other diseases of the circulatory system: Secondary | ICD-10-CM | POA: Insufficient documentation

## 2018-01-10 DIAGNOSIS — R079 Chest pain, unspecified: Secondary | ICD-10-CM | POA: Diagnosis not present

## 2018-01-10 DIAGNOSIS — E039 Hypothyroidism, unspecified: Secondary | ICD-10-CM | POA: Insufficient documentation

## 2018-01-10 DIAGNOSIS — F329 Major depressive disorder, single episode, unspecified: Secondary | ICD-10-CM | POA: Diagnosis not present

## 2018-01-10 DIAGNOSIS — Z79890 Hormone replacement therapy (postmenopausal): Secondary | ICD-10-CM | POA: Diagnosis not present

## 2018-01-10 DIAGNOSIS — F17210 Nicotine dependence, cigarettes, uncomplicated: Secondary | ICD-10-CM | POA: Diagnosis not present

## 2018-01-10 DIAGNOSIS — Z7982 Long term (current) use of aspirin: Secondary | ICD-10-CM | POA: Diagnosis not present

## 2018-01-10 DIAGNOSIS — Z9221 Personal history of antineoplastic chemotherapy: Secondary | ICD-10-CM | POA: Insufficient documentation

## 2018-01-10 DIAGNOSIS — Z9012 Acquired absence of left breast and nipple: Secondary | ICD-10-CM | POA: Diagnosis not present

## 2018-01-10 DIAGNOSIS — I10 Essential (primary) hypertension: Secondary | ICD-10-CM | POA: Diagnosis not present

## 2018-01-10 DIAGNOSIS — I700 Atherosclerosis of aorta: Secondary | ICD-10-CM | POA: Insufficient documentation

## 2018-01-10 DIAGNOSIS — Z791 Long term (current) use of non-steroidal anti-inflammatories (NSAID): Secondary | ICD-10-CM | POA: Insufficient documentation

## 2018-01-10 DIAGNOSIS — R9431 Abnormal electrocardiogram [ECG] [EKG]: Secondary | ICD-10-CM | POA: Diagnosis not present

## 2018-01-10 DIAGNOSIS — Z79899 Other long term (current) drug therapy: Secondary | ICD-10-CM | POA: Insufficient documentation

## 2018-01-10 DIAGNOSIS — R609 Edema, unspecified: Secondary | ICD-10-CM

## 2018-01-10 DIAGNOSIS — Z853 Personal history of malignant neoplasm of breast: Secondary | ICD-10-CM | POA: Insufficient documentation

## 2018-01-10 LAB — COMPREHENSIVE METABOLIC PANEL WITH GFR
ALT: 24 U/L (ref 0–44)
AST: 26 U/L (ref 15–41)
Albumin: 4.4 g/dL (ref 3.5–5.0)
Alkaline Phosphatase: 148 U/L — ABNORMAL HIGH (ref 38–126)
Anion gap: 10 (ref 5–15)
BUN: 17 mg/dL (ref 8–23)
CO2: 19 mmol/L — ABNORMAL LOW (ref 22–32)
Calcium: 9.2 mg/dL (ref 8.9–10.3)
Chloride: 108 mmol/L (ref 98–111)
Creatinine, Ser: 0.97 mg/dL (ref 0.44–1.00)
GFR calc Af Amer: >60 mL/min
GFR calc non Af Amer: >60 mL/min
Glucose, Bld: 114 mg/dL — ABNORMAL HIGH (ref 70–99)
Potassium: 4.1 mmol/L (ref 3.5–5.1)
Sodium: 137 mmol/L (ref 135–145)
Total Bilirubin: 0.5 mg/dL (ref 0.3–1.2)
Total Protein: 7.2 g/dL (ref 6.5–8.1)

## 2018-01-10 LAB — TROPONIN I: Troponin I: 0.03 ng/mL (ref <0.03)

## 2018-01-10 LAB — CBC
HCT: 41 % (ref 36.0–46.0)
Hemoglobin: 13.5 g/dL (ref 12.0–15.0)
MCH: 29.2 pg (ref 26.0–34.0)
MCHC: 32.9 g/dL (ref 30.0–36.0)
MCV: 88.7 fL (ref 80.0–100.0)
Platelets: 230 10*3/uL (ref 150–400)
RBC: 4.62 MIL/uL (ref 3.87–5.11)
RDW: 13.4 % (ref 11.5–15.5)
WBC: 9.6 10*3/uL (ref 4.0–10.5)
nRBC: 0 % (ref 0.0–0.2)

## 2018-01-10 MED ORDER — IOPAMIDOL (ISOVUE-370) INJECTION 76%
75.0000 mL | Freq: Once | INTRAVENOUS | Status: AC | PRN
  Administered 2018-01-10: 75 mL via INTRAVENOUS

## 2018-01-10 NOTE — ED Provider Notes (Signed)
[facility] Emergency Department Provider Note _______________   First MD Initiated Contact with Patient 01/10/18 2315     (approximate)  I have reviewed the triage vital signs and the nursing notes.   HISTORY  Chief Complaint Palpitations    HPI Alicia [name] is a 67 y.o. female with below list of chronic conditions including hypertension rapid heartbeat hypothyroidism and breast cancer status post recent mastectomy on presents to the emergency department with 12/23/2017 by Dr. Bary Castilla presents to the emergency department with rapid heartbeat which started tonight associated with dyspnea and chest discomfort.  Patient denies any nausea or vomiting.  Patient denies any diaphoresis.  Patient states that rapid heartbeat is resolved at this time however chest discomfort has persisted.  Patient states that she took 2 baby aspirins" before arrival to the emergency department.   Past Medical History:  Diagnosis Date  . Anxiety   . Cancer ([place]) 07/2017   left breast  . Depression   . Hypertension   . Hypothyroidism   . Rapid heart rate   . Thyroid disease     Patient Active Problem List   Diagnosis Date Noted  . Pulmonary embolism ([place]) 01/11/2018  . Sepsis ([place]) 10/05/2017  . Breast cancer, stage 2, left ([place]) 07/24/2017  . Elevated troponin 02/17/2015    Past Surgical History:  Procedure Laterality Date  . AXILLARY LYMPH NODE BIOPSY Left 07/16/2017   METASTATIC MAMMARY CARCINOMA  . BREAST BIOPSY Left 07/16/2017   [place] bx of left breast mass and left breast LN.  INVASIVE MAMMARY CARCINOMA, NO SPECIAL TYPE.   Marland Kitchen BREAST EXCISIONAL BIOPSY Right 2001  . BREAST LUMPECTOMY Right    2001  . BREAST LUMPECTOMY WITH SENTINEL LYMPH NODE BIOPSY Left 12/06/2017   Procedure: BREAST LUMPECTOMY WITH SENTINEL LYMPH NODE BX;  Surgeon: Robert Bellow, MD;  Location: [facility] ORS;  Service: General;  Laterality: Left;  . COLONOSCOPY    . PORTACATH PLACEMENT Right  08/07/2017   Procedure: INSERTION PORT-A-CATH;  Surgeon: Robert Bellow, MD;  Location: [facility] ORS;  Service: General;  Laterality: Right;  . SIMPLE MASTECTOMY WITH AXILLARY SENTINEL NODE BIOPSY Left 12/23/2017   Procedure: SIMPLE MASTECTOMY;  Surgeon: Robert Bellow, MD;  Location: [facility] ORS;  Service: General;  Laterality: Left;  . TONSILLECTOMY      Prior to Admission medications   Medication Sig Start Date End Date Taking? Authorizing Provider  acetaminophen (TYLENOL) 500 MG tablet Take 1,000 mg by mouth every 6 (six) hours as needed for moderate pain or headache.    Yes [provider]  albuterol (PROVENTIL HFA;VENTOLIN HFA) 108 (90 Base) MCG/ACT inhaler Inhale 2 puffs into the lungs every 6 (six) hours as needed for wheezing or shortness of breath. 08/09/17  Yes Burns, Wandra Feinstein, NP  ALPRAZolam Duanne Moron) 0.25 MG tablet Take 0.25 mg by mouth 2 (two) times daily.    Yes [provider]  amLODipine (NORVASC) 5 MG tablet Take 5 mg by mouth at bedtime.    Yes [provider]  aspirin EC 81 MG tablet Take 81 mg by mouth at bedtime.    Yes [provider]  HYDROcodone-acetaminophen (NORCO/VICODIN) 5-325 MG tablet Take 1 tablet by mouth every 4 (four) hours as needed for moderate pain. 12/06/17 12/06/18 Yes Byrnett, Forest Gleason, MD  levothyroxine (SYNTHROID, LEVOTHROID) 100 MCG tablet Take 100 mcg by mouth daily before breakfast.   Yes [provider]  lidocaine-prilocaine (EMLA) cream Apply to affected area once 07/31/17  Yes Lloyd Huger, MD  loratadine (CLARITIN) 10 MG tablet Take 10 mg by mouth daily as needed for allergies.   Yes [provider]  Lysine 500 MG CAPS Take 500 mg by mouth daily as needed (for fever blister).    Yes [provider]  naproxen sodium (ALEVE) 220 MG tablet Take 220-440 mg by mouth 2 (two) times daily as needed (swelling/ pain).   Yes [provider]  Potassium 99 MG TABS Take 99 mg by mouth  at bedtime.    Yes [provider]  traZODone (DESYREL) 50 MG tablet Take 50 mg by mouth at bedtime.   Yes [provider]    Allergies Sulfa antibiotics  Family History  Problem Relation Age of Onset  . Stroke Mother   . Thyroid disease Mother   . Renal Disease Mother   . Stroke Father   . Heart attack Father   . Sudden death Father 14       suicide  . Anuerysm Brother   . Breast cancer Neg Hx     Social History Social History   Tobacco Use  . Smoking status: Current Some Day Smoker    Packs/day: 0.25    Years: 30.00    Pack years: 7.50    Types: Cigarettes  . Smokeless tobacco: Never Used  Substance Use Topics  . Alcohol use: Yes    Comment: occasional q 67mo   . Drug use: Never    Review of Systems Constitutional: No fever/chills Eyes: No visual changes. ENT: No sore throat. Cardiovascular: positive for chest pain. Respiratory: Positive for shortness of breath. Gastrointestinal: No abdominal pain.  No nausea, no vomiting.  No diarrhea.  No constipation. Genitourinary: Negative for dysuria. Musculoskeletal: Negative for neck pain.  Negative for back pain. Integumentary: Negative for rash. Neurological: Negative for headaches, focal weakness or numbness.   ____________________________________________   PHYSICAL EXAM:  VITAL SIGNS: ED Triage Vitals [01/10/18 1951]  Enc Vitals Group     BP (!) 153/81     Pulse Rate 89     Resp 20     Temp 97.8 F (36.6 C)     Temp Source Oral     SpO2 96 %     Weight 93 kg (205 lb)     Height 1.676 m (5\' 6" )     Head Circumference      Peak Flow      Pain Score 5     Pain Loc      Pain Edu?      Excl. in [place]?     Constitutional: Alert and oriented. Well appearing and in no acute distress. Eyes: Conjunctivae are normal. PERRL. EOMI. Mouth/Throat: Mucous membranes are moist.  Oropharynx non-erythematous. Neck: No stridor.   Cardiovascular: Normal rate, regular rhythm. Good peripheral  circulation. Grossly normal heart sounds. Respiratory: Normal respiratory effort.  No retractions. Lungs CTAB. Gastrointestinal: Soft and nontender. No distention.  Musculoskeletal: No lower extremity tenderness nor edema. No gross deformities of extremities. Neurologic:  Normal speech and language. No gross focal neurologic deficits are appreciated.  Skin:  Skin is warm, dry and intact. No rash noted. Psychiatric: Mood and affect are normal. Speech and behavior are normal.  ____________________________________________   LABS (all labs ordered are listed, but only abnormal results are displayed)  Labs Reviewed  COMPREHENSIVE METABOLIC PANEL - Abnormal; Notable for the following components:      Result Value   CO2 19 (*)    Glucose, Bld  114 (*)    Alkaline Phosphatase 148 (*)    All other components within normal limits  TROPONIN I - Abnormal; Notable for the following components:   Troponin I 0.03 (*)    All other components within normal limits  TROPONIN I - Abnormal; Notable for the following components:   Troponin I 0.04 (*)    All other components within normal limits  TROPONIN I - Abnormal; Notable for the following components:   Troponin I 0.03 (*)    All other components within normal limits  CBC  PROTIME-INR  APTT  TROPONIN I  TROPONIN I  BASIC METABOLIC PANEL  CBC   ____________________________________________  EKG  ED ECG REPORT I, Wirt N Elianna Windom, the attending physician, personally viewed and interpreted this ECG.   Date: 01/10/2018  EKG Time: 11:13 PM  Rate: 80  Rhythm: Normal sinus rhythm  Axis: Normal  Intervals: Normal  ST&T Change: None  ____________________________________________  RADIOLOGY I,  N Davie Sagona, personally viewed and evaluated these images (plain radiographs) as part of my medical decision making, as well as reviewing the written report by the radiologist.  ED MD interpretation: Chest x-ray revealed no active cardiopulmonary  disease per radiologist.  CT chest revealed single small acute pulmonary embolus within the left lower lobe per radiologist.   Official radiology report(s): Ct Angio Chest Pe W And/or Wo Contrast  Addendum Date: 01/11/2018   ADDENDUM REPORT: 01/11/2018 00:35 ADDENDUM: Critical Value/emergent results were called by telephone at the time of interpretation on 01/11/2018 at 12:25 am to Dr. Marjean Donna , who verbally acknowledged these results. Electronically Signed   By: Ulyses Jarred M.D.   On: 01/11/2018 00:35   Result Date: 01/11/2018 CLINICAL DATA:  Chest pain, dyspnea and palpitations EXAM: CT ANGIOGRAPHY CHEST WITH CONTRAST TECHNIQUE: Multidetector CT imaging of the chest was performed using the standard protocol during bolus administration of intravenous contrast. Multiplanar CT image reconstructions and MIPs were obtained to evaluate the vascular anatomy. CONTRAST:  57mL ISOVUE-370 IOPAMIDOL (ISOVUE-370) INJECTION 76% COMPARISON:  CT chest 08/01/2017 FINDINGS: Cardiovascular: --Pulmonary arteries: Contrast injection is sufficient to demonstrate satisfactory opacification of the pulmonary arteries to the segmental level.There is a single filling defect within the left lower lobe lateral segment. Otherwise, normal arterial enhancement. There is no evidence of right heart strain. The main pulmonary artery is within normal limits for size. --Aorta: Satisfactory opacification of the thoracic aorta. No aortic dissection or other acute aortic syndrome. Conventional 3 vessel aortic branching pattern. The aortic course and caliber are normal. There is minimal aortic atherosclerosis. --Heart: Normal size. No pericardial effusion. Mediastinum/Nodes: No mediastinal, hilar or axillary lymphadenopathy. The visualized thyroid and thoracic esophageal course are unremarkable. Lungs/Pleura: Calcified granuloma in the posterior right lung base. No nodule or mass otherwise. No pleural effusion or pneumothorax. No  focal airspace consolidation or pulmonary edema. Upper Abdomen: Contrast bolus timing is not optimized for evaluation of the abdominal organs. Within this limitation, the visualized organs of the upper abdomen are normal. Musculoskeletal: No chest wall abnormality. No acute or significant osseous findings. Review of the MIP images confirms the above findings. IMPRESSION: 1. Single, small acute pulmonary embolus within the left lower lobe lateral segmental artery. No evidence of right heart strain. 2. No other acute thoracic abnormality. Aortic Atherosclerosis (ICD10-I70.0). Electronically Signed: By: Ulyses Jarred M.D. On: 01/11/2018 00:12   US Venous Img Lower Bilateral  Result Date: 01/11/2018 CLINICAL DATA:  Lower extremity swelling. EXAM: BILATERAL LOWER EXTREMITY VENOUS DOPPLER ULTRASOUND TECHNIQUE: Gray-scale  sonography with graded compression, as well as color Doppler and duplex ultrasound were performed to evaluate the lower extremity deep venous systems from the level of the common femoral vein and including the common femoral, femoral, profunda femoral, popliteal and calf veins including the posterior tibial, peroneal and gastrocnemius veins when visible. The superficial great saphenous vein was also interrogated. Spectral Doppler was utilized to evaluate flow at rest and with distal augmentation maneuvers in the common femoral, femoral and popliteal veins. COMPARISON:  None. FINDINGS: RIGHT LOWER EXTREMITY Common Femoral Vein: No evidence of thrombus. Normal compressibility, respiratory phasicity and response to augmentation. Saphenofemoral Junction: No evidence of thrombus. Normal compressibility and flow on color Doppler imaging. Profunda Femoral Vein: No evidence of thrombus. Normal compressibility and flow on color Doppler imaging. Femoral Vein: No evidence of thrombus. Normal compressibility, respiratory phasicity and response to augmentation. Popliteal Vein: No evidence of thrombus. Normal  compressibility, respiratory phasicity and response to augmentation. Calf Veins: No evidence of thrombus. Normal compressibility and flow on color Doppler imaging. Other Findings:  None. LEFT LOWER EXTREMITY Common Femoral Vein: No evidence of thrombus. Normal compressibility, respiratory phasicity and response to augmentation. Saphenofemoral Junction: No evidence of thrombus. Normal compressibility and flow on color Doppler imaging. Profunda Femoral Vein: No evidence of thrombus. Normal compressibility and flow on color Doppler imaging. Femoral Vein: No evidence of thrombus. Normal compressibility, respiratory phasicity and response to augmentation. Popliteal Vein: No evidence of thrombus. Normal compressibility, respiratory phasicity and response to augmentation. Calf Veins: No evidence of thrombus. Normal compressibility and flow on color Doppler imaging. Other Findings:  None. IMPRESSION: No evidence of deep venous thrombosis in the lower extremities. Electronically Signed   By: Markus Daft M.D.   On: 01/11/2018 10:16     Procedures   ____________________________________________   INITIAL IMPRESSION / ASSESSMENT AND PLAN / ED COURSE  As part of my medical decision making, I reviewed the following data within the electronic MEDICAL RECORD NUMBER   67 year old female presenting with above-stated history and physical exam secondary to chest pain and dyspnea.  Concern for possible CAD and as such EKG and troponin was performed troponin positive at 0.03 EKG revealed no evidence of ischemia or infarction.  Also concern for possibility of pulmonary emboli given recent surgery and known diagnosis of cancer and as such CT chest was performed which revealed a pulmonary embolus.  Patient discussed with Dr. Jannifer Franklin for hospital admission further evaluation and management. ____________________________________________  FINAL CLINICAL IMPRESSION(S) / ED DIAGNOSES  Final diagnoses:  Other acute pulmonary embolism  without acute cor pulmonale ([place])     MEDICATIONS GIVEN DURING THIS VISIT:  Medications  albuterol (PROVENTIL) (2.5 MG/3ML) 0.083% nebulizer solution 2.5 mg (has no administration in time range)  ALPRAZolam (XANAX) tablet 0.25 mg (0.25 mg Oral Given 01/11/18 1144)  aspirin EC tablet 81 mg (has no administration in time range)  amLODipine (NORVASC) tablet 5 mg (has no administration in time range)  traZODone (DESYREL) tablet 50 mg (has no administration in time range)  levothyroxine (SYNTHROID, LEVOTHROID) tablet 100 mcg (has no administration in time range)  HYDROcodone-acetaminophen (NORCO/VICODIN) 5-325 MG per tablet 1 tablet (1 tablet Oral Given 01/11/18 1145)  acetaminophen (TYLENOL) tablet 650 mg (has no administration in time range)    Or  acetaminophen (TYLENOL) suppository 650 mg (has no administration in time range)  docusate sodium (COLACE) capsule 100 mg (100 mg Oral Given 01/11/18 1144)  ondansetron (ZOFRAN) tablet 4 mg (has no administration in time range)  Or  ondansetron (ZOFRAN) injection 4 mg (has no administration in time range)  apixaban (ELIQUIS) tablet 10 mg (10 mg Oral Given 01/11/18 0431)    Followed by  apixaban (ELIQUIS) tablet 5 mg (has no administration in time range)  ketorolac (TORADOL) 30 MG/ML injection 30 mg (has no administration in time range)  loratadine (CLARITIN) tablet 10 mg (has no administration in time range)  iopamidol (ISOVUE-370) 76 % injection 75 mL (75 mLs Intravenous Contrast Given 01/10/18 2350)     ED Discharge Orders    None       Note:  This document was prepared using Dragon voice recognition software and may include unintentional dictation errors.    Gregor Hams, MD 01/11/18 [phone]

## 2018-01-10 NOTE — ED Triage Notes (Signed)
PT here with co palpitations states started tonight. States had left sided mastectomy 12/9. Was dx with breast CA in July, states had chemo in October.

## 2018-01-10 NOTE — ED Notes (Signed)
Date and time results received: 01/10/18  (use smartphrase ".now" to insert current time)  Test: troponon Critical Value: 0.03  Name of Provider Notified: Dr. Alfred Levins  Orders Received? Or Actions Taken?: acknowledged

## 2018-01-11 ENCOUNTER — Observation Stay: Payer: Medicare Other

## 2018-01-11 ENCOUNTER — Encounter: Payer: Self-pay | Admitting: Surgery

## 2018-01-11 ENCOUNTER — Other Ambulatory Visit: Payer: Self-pay

## 2018-01-11 DIAGNOSIS — I10 Essential (primary) hypertension: Secondary | ICD-10-CM | POA: Diagnosis not present

## 2018-01-11 DIAGNOSIS — E039 Hypothyroidism, unspecified: Secondary | ICD-10-CM | POA: Diagnosis not present

## 2018-01-11 DIAGNOSIS — M7989 Other specified soft tissue disorders: Secondary | ICD-10-CM | POA: Diagnosis not present

## 2018-01-11 DIAGNOSIS — I2699 Other pulmonary embolism without acute cor pulmonale: Secondary | ICD-10-CM | POA: Diagnosis present

## 2018-01-11 DIAGNOSIS — C50919 Malignant neoplasm of unspecified site of unspecified female breast: Secondary | ICD-10-CM | POA: Diagnosis not present

## 2018-01-11 LAB — PROTIME-INR
INR: 0.97
Prothrombin Time: 12.8 seconds (ref 11.4–15.2)

## 2018-01-11 LAB — TROPONIN I
Troponin I: 0.03 ng/mL (ref <0.03)
Troponin I: 0.04 ng/mL (ref <0.03)

## 2018-01-11 LAB — APTT: aPTT: 32 seconds (ref 24–36)

## 2018-01-11 MED ORDER — ALBUTEROL SULFATE (2.5 MG/3ML) 0.083% IN NEBU: 2.5000 mg | INHALATION_SOLUTION | RESPIRATORY_TRACT | Status: DC | PRN

## 2018-01-11 MED ORDER — APIXABAN 5 MG PO TABS
10.0000 mg | ORAL_TABLET | Freq: Two times a day (BID) | ORAL | Status: DC
  Administered 2018-01-11 – 2018-01-12 (×3): 10 mg via ORAL
  Filled 2018-01-11 (×4): qty 2

## 2018-01-11 MED ORDER — APIXABAN 5 MG PO TABS: 5.0000 mg | ORAL_TABLET | Freq: Two times a day (BID) | ORAL | Status: DC

## 2018-01-11 MED ORDER — POTASSIUM 99 MG PO TABS: 99.0000 mg | ORAL_TABLET | Freq: Every day | ORAL | Status: DC

## 2018-01-11 MED ORDER — APIXABAN 5 MG PO TABS: 10.0000 mg | ORAL_TABLET | Freq: Two times a day (BID) | ORAL | Status: DC

## 2018-01-11 MED ORDER — KETOROLAC TROMETHAMINE 30 MG/ML IJ SOLN
30.0000 mg | Freq: Once | INTRAMUSCULAR | Status: AC
  Administered 2018-01-11: 30 mg via INTRAVENOUS
  Filled 2018-01-11: qty 1

## 2018-01-11 MED ORDER — ALPRAZOLAM 0.25 MG PO TABS
0.2500 mg | ORAL_TABLET | Freq: Two times a day (BID) | ORAL | Status: DC
  Administered 2018-01-11 – 2018-01-12 (×3): 0.25 mg via ORAL
  Filled 2018-01-11 (×3): qty 1

## 2018-01-11 MED ORDER — ASPIRIN EC 81 MG PO TBEC
81.0000 mg | DELAYED_RELEASE_TABLET | Freq: Every day | ORAL | Status: DC
  Administered 2018-01-11: 81 mg via ORAL
  Filled 2018-01-11: qty 1

## 2018-01-11 MED ORDER — ACETAMINOPHEN 650 MG RE SUPP: 650.0000 mg | Freq: Four times a day (QID) | RECTAL | Status: DC | PRN

## 2018-01-11 MED ORDER — HYDROCODONE-ACETAMINOPHEN 5-325 MG PO TABS
1.0000 | ORAL_TABLET | ORAL | Status: DC | PRN
  Administered 2018-01-11 – 2018-01-12 (×3): 1 via ORAL
  Filled 2018-01-11 (×3): qty 1

## 2018-01-11 MED ORDER — ONDANSETRON HCL 4 MG PO TABS: 4.0000 mg | ORAL_TABLET | Freq: Four times a day (QID) | ORAL | Status: DC | PRN

## 2018-01-11 MED ORDER — LEVOTHYROXINE SODIUM 100 MCG PO TABS
100.0000 ug | ORAL_TABLET | Freq: Every day | ORAL | Status: DC
  Administered 2018-01-12: 100 ug via ORAL
  Filled 2018-01-11: qty 1

## 2018-01-11 MED ORDER — LORATADINE 10 MG PO TABS
10.0000 mg | ORAL_TABLET | Freq: Every day | ORAL | Status: DC
  Administered 2018-01-11 – 2018-01-12 (×2): 10 mg via ORAL
  Filled 2018-01-11 (×2): qty 1

## 2018-01-11 MED ORDER — DOCUSATE SODIUM 100 MG PO CAPS
100.0000 mg | ORAL_CAPSULE | Freq: Two times a day (BID) | ORAL | Status: DC
  Administered 2018-01-11: 100 mg via ORAL
  Filled 2018-01-11 (×2): qty 1

## 2018-01-11 MED ORDER — ONDANSETRON HCL 4 MG/2ML IJ SOLN: 4.0000 mg | Freq: Four times a day (QID) | INTRAMUSCULAR | Status: DC | PRN

## 2018-01-11 MED ORDER — TRAZODONE HCL 50 MG PO TABS
50.0000 mg | ORAL_TABLET | Freq: Every day | ORAL | Status: DC
  Administered 2018-01-11: 50 mg via ORAL
  Filled 2018-01-11: qty 1

## 2018-01-11 MED ORDER — ACETAMINOPHEN 325 MG PO TABS: 650.0000 mg | ORAL_TABLET | Freq: Four times a day (QID) | ORAL | Status: DC | PRN

## 2018-01-11 MED ORDER — LORATADINE 10 MG PO TABS: 10.0000 mg | ORAL_TABLET | Freq: Every day | ORAL | Status: DC | PRN

## 2018-01-11 MED ORDER — AMLODIPINE BESYLATE 5 MG PO TABS
5.0000 mg | ORAL_TABLET | Freq: Every day | ORAL | Status: DC
  Administered 2018-01-11: 5 mg via ORAL
  Filled 2018-01-11: qty 1

## 2018-01-11 MED ORDER — LYSINE 500 MG PO CAPS: 500.0000 mg | ORAL_CAPSULE | Freq: Every day | ORAL | Status: DC | PRN

## 2018-01-11 NOTE — Progress Notes (Signed)
ANTICOAGULATION CONSULT NOTE - Initial Consult  Pharmacy Consult for Apixaban Indication: pulmonary embolus  Allergies  Allergen Reactions  . Sulfa Antibiotics Diarrhea    Patient Measurements: Height: 5\' 6"  (167.6 cm) Weight: 205 lb (93 kg) IBW/kg (Calculated) : 59.3 Heparin Dosing Weight:    Vital Signs: Temp: 97.8 F (36.6 C) (12/27 1951) Temp Source: Oral (12/27 1951) BP: 153/81 (12/27 1951) Pulse Rate: 73 (12/28 0130)  Labs: Recent Labs    01/10/18 1954 01/10/18 2336  HGB 13.5  --   HCT 41.0  --   PLT 230  --   APTT  --  32  LABPROT  --  12.8  INR  --  0.97  CREATININE 0.97  --   TROPONINI 0.03* 0.04*    Estimated Creatinine Clearance: 63.8 mL/min (by C-G formula based on SCr of 0.97 mg/dL).   Medical History: Past Medical History:  Diagnosis Date  . Anxiety   . Cancer ([place]) 07/2017   left breast  . Depression   . Hypertension   . Hypothyroidism   . Rapid heart rate   . Thyroid disease     Assessment: Patient is a 67yo female admitted for palpatations. CT shows small PE without heart strain.  Plan:  Will order Apixaban 10mg  BID for 7 days followed by 5mg  BID.  Paulina Fusi, PharmD, BCPS 01/11/2018 3:06 AM

## 2018-01-11 NOTE — Progress Notes (Signed)
Patient with recent diagnosis of breast cancer 6 months ago, on chemotherapy, just had left breast mastectomy 2 weeks ago doing well comes with palpitations and noted to have left lower lung segment for pulmonary embolism. -No right heart strain noted.  Vitals are stable.  Started on Eliquis this morning. -Check Dopplers to rule out DVT -Continue to monitor today and hopefully discharge tomorrow if doing well -Agree with plan of care by Dr. Marcille Blanco

## 2018-01-11 NOTE — Care Management Obs Status (Signed)
[place] NOTIFICATION   Patient Details  Name: Alicia [name] MRN: [id number] Date of Birth: 10/10/1950   Medicare Observation Status Notification Given:  Yes    Karyl Sharrar A Temitayo Covalt, RN 01/11/2018, 2:23 PM

## 2018-01-11 NOTE — H&P (Signed)
Alicia [name] is an 67 y.o. female.   Chief Complaint: Palpitations HPI: The patient with past medical history of breast cancer status post very recent mastectomy presents to the emergency department complaining of palpitations.  She denies chest pain but admits to occasional shortness of breath or tightness in her chest when she breathes.  CT of her chest revealed pulmonary embolism.  Though the patient does not have any right heart strain she still has drains in place from her surgery which concerned the emergency department staff who asked the hospitalist service for observation during initiation of anticoagulation.  Past Medical History:  Diagnosis Date  . Anxiety   . Cancer ([place]) 07/2017   left breast  . Depression   . Hypertension   . Hypothyroidism   . Rapid heart rate   . Thyroid disease     Past Surgical History:  Procedure Laterality Date  . AXILLARY LYMPH NODE BIOPSY Left 07/16/2017   METASTATIC MAMMARY CARCINOMA  . BREAST BIOPSY Left 07/16/2017   [place] bx of left breast mass and left breast LN.  INVASIVE MAMMARY CARCINOMA, NO SPECIAL TYPE.   Marland Kitchen BREAST EXCISIONAL BIOPSY Right 2001  . BREAST LUMPECTOMY Right    2001  . BREAST LUMPECTOMY WITH SENTINEL LYMPH NODE BIOPSY Left 12/06/2017   Procedure: BREAST LUMPECTOMY WITH SENTINEL LYMPH NODE BX;  Surgeon: Robert Bellow, MD;  Location: [facility] ORS;  Service: General;  Laterality: Left;  . COLONOSCOPY    . PORTACATH PLACEMENT Right 08/07/2017   Procedure: INSERTION PORT-A-CATH;  Surgeon: Robert Bellow, MD;  Location: [facility] ORS;  Service: General;  Laterality: Right;  . SIMPLE MASTECTOMY WITH AXILLARY SENTINEL NODE BIOPSY Left 12/23/2017   Procedure: SIMPLE MASTECTOMY;  Surgeon: Robert Bellow, MD;  Location: [facility] ORS;  Service: General;  Laterality: Left;  . TONSILLECTOMY      Family History  Problem Relation Age of Onset  . Stroke Mother   . Thyroid disease Mother   . Renal Disease Mother   . Stroke Father   .  Heart attack Father   . Sudden death Father 23       suicide  . Anuerysm Brother   . Breast cancer Neg Hx    Social History:  reports that she has been smoking cigarettes. She has a 7.50 pack-year smoking history. She has never used smokeless tobacco. She reports current alcohol use. She reports that she does not use drugs.  Allergies:  Allergies  Allergen Reactions  . Sulfa Antibiotics Diarrhea    Medications Prior to Admission  Medication Sig Dispense Refill  . acetaminophen (TYLENOL) 500 MG tablet Take 1,000 mg by mouth every 6 (six) hours as needed for moderate pain or headache.     . albuterol (PROVENTIL HFA;VENTOLIN HFA) 108 (90 Base) MCG/ACT inhaler Inhale 2 puffs into the lungs every 6 (six) hours as needed for wheezing or shortness of breath. 1 Inhaler 2  . ALPRAZolam (XANAX) 0.25 MG tablet Take 0.25 mg by mouth 2 (two) times daily.     Marland Kitchen amLODipine (NORVASC) 5 MG tablet Take 5 mg by mouth at bedtime.     Marland Kitchen aspirin EC 81 MG tablet Take 81 mg by mouth at bedtime.     Marland Kitchen HYDROcodone-acetaminophen (NORCO/VICODIN) 5-325 MG tablet Take 1 tablet by mouth every 4 (four) hours as needed for moderate pain. 20 tablet 0  . levothyroxine (SYNTHROID, LEVOTHROID) 100 MCG tablet Take 100 mcg by mouth daily before breakfast.    . lidocaine-prilocaine (EMLA) cream  Apply to affected area once 30 g 3  . loratadine (CLARITIN) 10 MG tablet Take 10 mg by mouth daily as needed for allergies.    . Lysine 500 MG CAPS Take 500 mg by mouth daily as needed (for fever blister).     . naproxen sodium (ALEVE) 220 MG tablet Take 220-440 mg by mouth 2 (two) times daily as needed (swelling/ pain).    . Potassium 99 MG TABS Take 99 mg by mouth at bedtime.     . traZODone (DESYREL) 50 MG tablet Take 50 mg by mouth at bedtime.      Results for orders placed or performed during the hospital encounter of 01/10/18 (from the past 48 hour(s))  CBC     Status: None   Collection Time: 01/10/18  7:54 PM  Result Value  Ref Range   WBC 9.6 4.0 - 10.5 K/uL   RBC 4.62 3.87 - 5.11 MIL/uL   Hemoglobin 13.5 12.0 - 15.0 g/dL   HCT 41.0 36.0 - 46.0 %   MCV 88.7 80.0 - 100.0 fL   MCH 29.2 26.0 - 34.0 pg   MCHC 32.9 30.0 - 36.0 g/dL   RDW 13.4 11.5 - 15.5 %   Platelets 230 150 - 400 K/uL   nRBC 0.0 0.0 - 0.2 %    Comment: Performed at [facility], [address] [place], [place] [address]  Comprehensive metabolic panel     Status: Abnormal   Collection Time: 01/10/18  7:54 PM  Result Value Ref Range   Sodium 137 135 - 145 mmol/L   Potassium 4.1 3.5 - 5.1 mmol/L   Chloride 108 98 - 111 mmol/L   CO2 19 (L) 22 - 32 mmol/L   Glucose, Bld 114 (H) 70 - 99 mg/dL   BUN 17 8 - 23 mg/dL   Creatinine, Ser 0.97 0.44 - 1.00 mg/dL   Calcium 9.2 8.9 - 10.3 mg/dL   Total Protein 7.2 6.5 - 8.1 g/dL   Albumin 4.4 3.5 - 5.0 g/dL   AST 26 15 - 41 U/L   ALT 24 0 - 44 U/L   Alkaline Phosphatase 148 (H) 38 - 126 U/L   Total Bilirubin 0.5 0.3 - 1.2 mg/dL   GFR calc non Af Amer >60 >60 mL/min   GFR calc Af Amer >60 >60 mL/min   Anion gap 10 5 - 15    Comment: Performed at [facility], [address] [place], [place] [address]  Troponin I - ONCE - STAT     Status: Abnormal   Collection Time: 01/10/18  7:54 PM  Result Value Ref Range   Troponin I 0.03 (HH) <0.03 ng/mL    Comment: CRITICAL RESULT CALLED TO, READ BACK BY AND VERIFIED WITH MICHELLE MORTON ON 01/10/18 AT 2027 [facility] Performed at [facility], [address] [place], [place] [address]   Troponin I - ONCE - STAT     Status: Abnormal   Collection Time: 01/10/18 11:36 PM  Result Value Ref Range   Troponin I 0.04 (HH) <0.03 ng/mL    Comment: CRITICAL RESULT CALLED TO, READ BACK BY AND VERIFIED WITH MICHELLE MORTON ON 01/11/18 AT 2027 [facility] Performed at [facility], [address] [place], [place] [address]   Protime-INR     Status: None   Collection Time: 01/10/18 11:36 PM  Result Value Ref Range   Prothrombin Time  12.8 11.4 - 15.2 seconds   INR 0.97     Comment:  Performed at [facility], [address] [place], [place] [address]  APTT     Status: None   Collection Time: 01/10/18 11:36 PM  Result Value Ref Range   aPTT 32 24 - 36 seconds    Comment: Performed at [facility], [address] [place], [place] [address]   Dg Chest 2 View  Result Date: 01/10/2018 CLINICAL DATA:  Chest pain. EXAM: CHEST - 2 VIEW COMPARISON:  Radiographs of October 04, 2017. FINDINGS: The heart size and mediastinal contours are within normal limits. Both lungs are clear. No pneumothorax or pleural effusion is noted. Right subclavian Port-A-Cath is unchanged in position. The visualized skeletal structures are unremarkable. IMPRESSION: No active cardiopulmonary disease. Electronically Signed   By: Marijo Conception, M.D.   On: 01/10/2018 20:25   Ct Angio Chest Pe W And/or Wo Contrast  Addendum Date: 01/11/2018   ADDENDUM REPORT: 01/11/2018 00:35 ADDENDUM: Critical Value/emergent results were called by telephone at the time of interpretation on 01/11/2018 at 12:25 am to Dr. Marjean Donna , who verbally acknowledged these results. Electronically Signed   By: Ulyses Jarred M.D.   On: 01/11/2018 00:35   Result Date: 01/11/2018 CLINICAL DATA:  Chest pain, dyspnea and palpitations EXAM: CT ANGIOGRAPHY CHEST WITH CONTRAST TECHNIQUE: Multidetector CT imaging of the chest was performed using the standard protocol during bolus administration of intravenous contrast. Multiplanar CT image reconstructions and MIPs were obtained to evaluate the vascular anatomy. CONTRAST:  83mL ISOVUE-370 IOPAMIDOL (ISOVUE-370) INJECTION 76% COMPARISON:  CT chest 08/01/2017 FINDINGS: Cardiovascular: --Pulmonary arteries: Contrast injection is sufficient to demonstrate satisfactory opacification of the pulmonary arteries to the segmental level.There is a single filling defect within the left lower lobe lateral segment. Otherwise, normal  arterial enhancement. There is no evidence of right heart strain. The main pulmonary artery is within normal limits for size. --Aorta: Satisfactory opacification of the thoracic aorta. No aortic dissection or other acute aortic syndrome. Conventional 3 vessel aortic branching pattern. The aortic course and caliber are normal. There is minimal aortic atherosclerosis. --Heart: Normal size. No pericardial effusion. Mediastinum/Nodes: No mediastinal, hilar or axillary lymphadenopathy. The visualized thyroid and thoracic esophageal course are unremarkable. Lungs/Pleura: Calcified granuloma in the posterior right lung base. No nodule or mass otherwise. No pleural effusion or pneumothorax. No focal airspace consolidation or pulmonary edema. Upper Abdomen: Contrast bolus timing is not optimized for evaluation of the abdominal organs. Within this limitation, the visualized organs of the upper abdomen are normal. Musculoskeletal: No chest wall abnormality. No acute or significant osseous findings. Review of the MIP images confirms the above findings. IMPRESSION: 1. Single, small acute pulmonary embolus within the left lower lobe lateral segmental artery. No evidence of right heart strain. 2. No other acute thoracic abnormality. Aortic Atherosclerosis (ICD10-I70.0). Electronically Signed: By: Ulyses Jarred M.D. On: 01/11/2018 00:12    Review of Systems  Constitutional: Negative for chills and fever.  HENT: Negative for sore throat and tinnitus.   Eyes: Negative for blurred vision and redness.  Respiratory: Negative for cough and shortness of breath.   Cardiovascular: Positive for palpitations. Negative for chest pain, orthopnea and PND.  Gastrointestinal: Negative for abdominal pain, diarrhea, nausea and vomiting.  Genitourinary: Negative for dysuria, frequency and urgency.  Musculoskeletal: Negative for joint pain and myalgias.  Skin: Negative for rash.       No lesions  Neurological: Negative for speech change,  focal weakness and weakness.  Endo/Heme/Allergies: Does not bruise/bleed easily.  No temperature intolerance  Psychiatric/Behavioral: Negative for depression and suicidal ideas.    Blood pressure (!) 143/72, pulse 68, temperature 98.4 F (36.9 C), temperature source Oral, resp. rate 16, height 5\' 6"  (1.676 m), weight 94.4 kg, SpO2 95 %. Physical Exam  Vitals reviewed. Constitutional: She is oriented to person, place, and time. She appears well-developed and well-nourished. No distress.  HENT:  Head: Normocephalic and atraumatic.  Mouth/Throat: Oropharynx is clear and moist.  Eyes: Pupils are equal, round, and reactive to light. Conjunctivae and EOM are normal. No scleral icterus.  Neck: Normal range of motion. Neck supple. No JVD present. No tracheal deviation present. No thyromegaly present.  Cardiovascular: Normal rate, regular rhythm and normal heart sounds. Exam reveals no gallop and no friction rub.  No murmur heard. Respiratory: Effort normal and breath sounds normal.  GI: Soft. Bowel sounds are normal. She exhibits no distension. There is no abdominal tenderness.  Genitourinary:    Genitourinary Comments: Deferred   Musculoskeletal: Normal range of motion.        General: No edema.  Lymphadenopathy:    She has no cervical adenopathy.  Neurological: She is alert and oriented to person, place, and time. No cranial nerve deficit. She exhibits normal muscle tone.  Skin: Skin is warm and dry. No rash noted. No erythema.  Psychiatric: She has a normal mood and affect. Her behavior is normal. Judgment and thought content normal.     Assessment/Plan This is a 67 year old female admitted for pulmonary embolism. 1.  Pulmonary embolism: Initiate Eliquis.  Monitor for blood loss.  Drains in place should make it easier to monitor. 2.  Hypertension: Controlled; continue amlodipine.  Fluctuations likely due to pain.  IV morphine as needed for severe pain. 3.  Hypothyroidism: Continue  Synthroid.  Check TSH 4.  Cancer: Status post chemo and left mastectomy.  Pain management as above 5.  DVT prophylaxis: Therapeutic anticoagulation 6.  GI prophylaxis: None The patient is a full code.  Time spent on admission orders and patient care approximately 45 minutes  Harrie Foreman, MD 01/11/2018, 8:56 AM

## 2018-01-12 DIAGNOSIS — I2699 Other pulmonary embolism without acute cor pulmonale: Secondary | ICD-10-CM | POA: Diagnosis not present

## 2018-01-12 DIAGNOSIS — E039 Hypothyroidism, unspecified: Secondary | ICD-10-CM | POA: Diagnosis not present

## 2018-01-12 DIAGNOSIS — C50919 Malignant neoplasm of unspecified site of unspecified female breast: Secondary | ICD-10-CM | POA: Diagnosis not present

## 2018-01-12 DIAGNOSIS — I10 Essential (primary) hypertension: Secondary | ICD-10-CM | POA: Diagnosis not present

## 2018-01-12 LAB — BASIC METABOLIC PANEL
Anion gap: 7 (ref 5–15)
BUN: 22 mg/dL (ref 8–23)
CO2: 24 mmol/L (ref 22–32)
Calcium: 8.9 mg/dL (ref 8.9–10.3)
Chloride: 107 mmol/L (ref 98–111)
Creatinine, Ser: 0.88 mg/dL (ref 0.44–1.00)
GFR calc Af Amer: >60 mL/min (ref >60)
GFR calc non Af Amer: >60 mL/min (ref >60)
Glucose, Bld: 93 mg/dL (ref 70–99)
Potassium: 4.2 mmol/L (ref 3.5–5.1)
Sodium: 138 mmol/L (ref 135–145)

## 2018-01-12 LAB — TROPONIN I
Troponin I: 0.03 ng/mL (ref <0.03)
Troponin I: <0.03 ng/mL (ref <0.03)

## 2018-01-12 LAB — CBC
HCT: 38.1 % (ref 36.0–46.0)
Hemoglobin: 12.2 g/dL (ref 12.0–15.0)
MCH: 29.1 pg (ref 26.0–34.0)
MCHC: 32 g/dL (ref 30.0–36.0)
MCV: 90.9 fL (ref 80.0–100.0)
Platelets: 225 10*3/uL (ref 150–400)
RBC: 4.19 MIL/uL (ref 3.87–5.11)
RDW: 13.2 % (ref 11.5–15.5)
WBC: 5.1 10*3/uL (ref 4.0–10.5)
nRBC: 0 % (ref 0.0–0.2)

## 2018-01-12 MED ORDER — SODIUM CHLORIDE 0.9 % IV SOLN
INTRAVENOUS | Status: AC
  Administered 2018-01-12: 10:00:00 via INTRAVENOUS

## 2018-01-12 MED ORDER — HEPARIN SOD (PORK) LOCK FLUSH 100 UNIT/ML IV SOLN
500.0000 [IU] | Freq: Once | INTRAVENOUS | Status: DC
  Filled 2018-01-12: qty 5

## 2018-01-12 MED ORDER — MECLIZINE HCL 25 MG PO TABS
25.0000 mg | ORAL_TABLET | Freq: Once | ORAL | Status: AC
  Administered 2018-01-12: 25 mg via ORAL
  Filled 2018-01-12: qty 1

## 2018-01-12 MED ORDER — APIXABAN 5 MG PO TABS: ORAL_TABLET | ORAL | 1 refills | Status: DC

## 2018-01-12 NOTE — Progress Notes (Signed)
Patient complained of dizziness, states that she believes that she has an left ear infection. This RN did not see anything concerning on assessment. Wax build up noted more in left ear than right. MD Jannifer Franklin notified. MD to place orders for meclizine to help with dizziness.   Update: patient asleep when RN attempted to administer medication. Medication time changed for AM. Will continue to monitor.  Iran Sizer M

## 2018-01-12 NOTE — Discharge Summary (Signed)
[facility] [place] NAME: Alicia [name]    MR#:  [id number]  DATE OF BIRTH:  1950-05-16  DATE OF ADMISSION:  01/10/2018   ADMITTING PHYSICIAN: Harrie Foreman, MD  DATE OF DISCHARGE: 01/12/2018  PRIMARY CARE PHYSICIAN: [name], MD   ADMISSION DIAGNOSIS:   Other acute pulmonary embolism without acute cor pulmonale ([place]) [I26.99]  DISCHARGE DIAGNOSIS:   Active Problems:   Pulmonary embolism ([place])   SECONDARY DIAGNOSIS:   Past Medical History:  Diagnosis Date  . Anxiety   . Cancer ([place]) 07/2017   left breast  . Depression   . Hypertension   . Hypothyroidism   . Rapid heart rate   . 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  1.  Left lower lobe segmental artery pulmonary embolism-likely risk factor is underlying breast cancer. -Dopplers negative for any DVT.  No evidence of right heart strain on the CT. -Vitals have been stable.  Patient complained of some dizziness which has improved with IV fluids.  Orthostatic blood pressure changes were negative -Patient is started on Eliquis in the hospital and will be discharged on the same.  2.  Hypertension-on Norvasc  3.  Hypothyroidism-on levothyroxine  4.  Left breast cancer-had lumpectomy, followed by left breast mastectomy and nodal dissection. -Finished chemotherapy and about to start radiation in the next couple of weeks. -Management per oncology.  5.  Anxiety-on Xanax  Patient has been ambulating well in the hospital.  Will be discharged today  DISCHARGE CONDITIONS:   [place] CONSULTS OBTAINED:   None  DRUG ALLERGIES:   Allergies  Allergen Reactions  . Sulfa Antibiotics Diarrhea   DISCHARGE  MEDICATIONS:   Allergies as of 01/12/2018      Reactions   Sulfa Antibiotics Diarrhea      Medication List    TAKE these medications   albuterol 108 (90 Base) MCG/ACT inhaler Commonly known as:  PROVENTIL HFA;VENTOLIN HFA Inhale 2 puffs into the lungs every 6 (six) hours as needed for wheezing or shortness of breath.   ALPRAZolam 0.25 MG tablet Commonly known as:  XANAX Take 0.25 mg by mouth 2 (two) times daily.   amLODipine 5 MG tablet Commonly known as:  NORVASC Take 5 mg by mouth at bedtime.   apixaban 5 MG Tabs tablet Commonly known as:  ELIQUIS Take 2 tablets of 8m twice a day for 1 week and then 1 tablet of 552mtwice a day   aspirin EC 81 MG tablet Take 81 mg by mouth at bedtime.   HYDROcodone-acetaminophen 5-325 MG tablet Commonly known as:  NORCO/VICODIN Take 1 tablet by mouth every 4 (four) hours as needed for moderate pain.   levothyroxine 100 MCG tablet Commonly known as:  SYNTHROID, LEVOTHROID Take 100 mcg by mouth daily before breakfast.   lidocaine-prilocaine cream Commonly known as:  EMLA Apply to affected area once   loratadine 10 MG tablet Commonly known as:  CLARITIN Take 10 mg by mouth daily as needed for allergies.   Lysine 500 MG Caps Take 500 mg by mouth daily as needed (for fever blister).   naproxen sodium 220 MG tablet Commonly known as:  ALEVE Take 220-440 mg by mouth 2 (two) times daily as needed (swelling/ pain).   Potassium  99 MG Tabs Take 99 mg by mouth at bedtime.   traZODone 50 MG tablet Commonly known as:  DESYREL Take 50 mg by mouth at bedtime.   TYLENOL 500 MG tablet Generic drug:  acetaminophen Take 1,000 mg by mouth every 6 (six) hours as needed for moderate pain or headache.        DISCHARGE INSTRUCTIONS:   1.  PCP follow-up in 1 to 2 weeks 2.  Oncology follow-up in 1 week 3.  General surgery follow-up as scheduled  DIET:   Cardiac diet  ACTIVITY:   Activity as tolerated  OXYGEN:   Home Oxygen:  No.  Oxygen Delivery: room air  DISCHARGE LOCATION:   home   If you experience worsening of your admission symptoms, develop shortness of breath, life threatening emergency, suicidal or homicidal thoughts you must seek medical attention immediately by calling 911 or calling your MD immediately  if symptoms less severe.  You Must read complete instructions/literature along with all the possible adverse reactions/side effects for all the Medicines you take and that have been prescribed to you. Take any new Medicines after you have completely understood and accpet all the possible adverse reactions/side effects.   Please note  You were cared for by a hospitalist during your hospital stay. If you have any questions about your discharge medications or the care you received while you were in the hospital after you are discharged, you can call the unit and asked to speak with the hospitalist on call if the hospitalist that took care of you is not available. Once you are discharged, your primary care physician will handle any further medical issues. Please note that NO REFILLS for any discharge medications will be authorized once you are discharged, as it is imperative that you return to your primary care physician (or establish a relationship with a primary care physician if you do not have one) for your aftercare needs so that they can reassess your need for medications and monitor your lab values.    On the day of Discharge:  VITAL SIGNS:   Blood pressure 113/67, pulse 67, temperature 97.8 F (36.6 C), temperature source Oral, resp. rate 20, height '5\' 6"'$  (1.676 m), weight 93.8 kg, SpO2 98 %.  PHYSICAL EXAMINATION:    GENERAL:  67 y.o.-year-old patient lying in the bed with no acute distress.  EYES: Pupils equal, round, reactive to light and accommodation. No scleral icterus. Extraocular muscles intact.  HEENT: Head atraumatic, normocephalic. Oropharynx and nasopharynx clear.  NECK:  Supple,  no jugular venous distention. No thyroid enlargement, no tenderness.  LUNGS: Normal breath sounds bilaterally, no wheezing, rales,rhonchi or crepitation. No use of accessory muscles of respiration.  BREAST-right breast normal.  Status post left breast mastectomy.  Sutures in place with Steri-Strips on.  Medial edges with some tenderness and swelling around.  Has a JP drain in.  With some discharge CARDIOVASCULAR: S1, S2 normal. No murmurs, rubs, or gallops.  ABDOMEN: Soft, non-tender, non-distended. Bowel sounds present. No organomegaly or mass.  EXTREMITIES: No pedal edema, cyanosis, or clubbing.  NEUROLOGIC: Cranial nerves II through XII are intact. Muscle strength 5/5 in all extremities. Sensation intact. Gait not checked.  PSYCHIATRIC: The patient is alert and oriented x 3.  SKIN: No obvious rash, lesion, or ulcer.   DATA REVIEW:   CBC Recent Labs  Lab 01/12/18 0725  WBC 5.1  HGB 12.2  HCT 38.1  PLT 225    Chemistries  Recent Labs  Lab 01/10/18 1954 01/12/18  0725  NA 137 138  K 4.1 4.2  CL 108 107  CO2 19* 24  GLUCOSE 114* 93  BUN 17 22  CREATININE 0.97 0.88  CALCIUM 9.2 8.9  AST 26  --   ALT 24  --   ALKPHOS 148*  --   BILITOT 0.5  --      Microbiology Results  Results for orders placed or performed in visit on 11/07/17  Urine Culture     Status: Abnormal   Collection Time: 11/07/17  1:30 PM  Result Value Ref Range Status   Specimen Description   Final    URINE, RANDOM Performed at [facility], [address], [place], [place] [address]    Special Requests   Final    NONE Performed at [facility], [address] [place], [place] [address]    Culture MULTIPLE SPECIES PRESENT, SUGGEST RECOLLECTION (A)  Final   Report Status 11/08/2017 FINAL  Final    RADIOLOGY:  No results found.   Management plans discussed with the patient, family and they are in agreement.  CODE STATUS:     Code Status Orders  (From admission, onward)           Start     Ordered   01/11/18 0317  Full code  Continuous     01/11/18 0316        Code Status History    Date Active Date Inactive Code Status Order ID Comments User Context   10/05/2017 0541 10/07/2017 2006 Full Code [id number]  Harrie Foreman, MD Inpatient   02/17/2015 2233 02/18/2015 1844 Full Code [id number]  Demetrios Loll, MD Inpatient      TOTAL TIME TAKING CARE OF THIS PATIENT: 38 minutes.    Gladstone Lighter M.D on 01/12/2018 at 11:38 AM  Between 7am to 6pm - Pager - [phone]  After 6pm go to [email or web address] - Proofreader  [facility]  Office  [phone]  CC: Primary care physician; [name], MD   Note: This dictation was prepared with Dragon dictation along with smaller phrase technology. Any transcriptional errors that result from this process are unintentional.

## 2018-01-16 ENCOUNTER — Ambulatory Visit (INDEPENDENT_AMBULATORY_CARE_PROVIDER_SITE_OTHER): Payer: Medicare Other | Admitting: Surgery

## 2018-01-16 ENCOUNTER — Encounter: Payer: Self-pay | Admitting: Surgery

## 2018-01-16 ENCOUNTER — Other Ambulatory Visit: Payer: Self-pay

## 2018-01-16 VITALS — BP 155/94 | HR 89 | Temp 97.7°F | Resp 18 | Ht 66.0 in | Wt 207.4 lb

## 2018-01-16 DIAGNOSIS — Z4889 Encounter for other specified surgical aftercare: Secondary | ICD-10-CM

## 2018-01-16 NOTE — Patient Instructions (Signed)
Please continue to record the drain output.   Please see your follow up appointment listed below.

## 2018-01-17 NOTE — Progress Notes (Signed)
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  INTERVAL HISTORY: Patient returns to clinic today for further evaluation and post-operative follow-up.  Given residual disease, she required a full mastectomy with axillary node dissection.  She continues to have the drain in place, but otherwise is healing well.  She continues to have a peripheral neuropathy, but no other neurologic complaints.  She continues to have chronic weakness and fatigue. She has a good appetite and denies weight loss. She has no chest pain or shortness of breath.  She denies any nausea, vomiting, constipation, or diarrhea.  She has no urinary complaints.  Patient offers no further specific complaints today.  REVIEW OF SYSTEMS:   Review of Systems  Constitutional: Positive for malaise/fatigue. Negative for fever and weight loss.  Respiratory: Negative.  Negative for cough and shortness of breath.   Cardiovascular: Negative.  Negative for chest pain and leg swelling.  Gastrointestinal: Negative.  Negative for abdominal pain and constipation.  Genitourinary: Negative.  Negative for dysuria and urgency.  Musculoskeletal: Negative.  Negative for back pain.  Skin: Negative.  Negative for rash.  Neurological: Positive for tingling, sensory change and weakness. Negative for dizziness, focal weakness and headaches.  Psychiatric/Behavioral: The patient is nervous/anxious.     As per HPI. Otherwise, a complete review of systems is negative.  PAST MEDICAL HISTORY: Past Medical History:  Diagnosis Date  . Anxiety   . Cancer ([place]) 07/2017   left breast  .  Depression   . Hypertension   . Hypothyroidism   . Rapid heart rate   . Thyroid disease     PAST SURGICAL HISTORY: Past Surgical History:  Procedure Laterality Date  . AXILLARY LYMPH NODE BIOPSY Left 07/16/2017   METASTATIC MAMMARY CARCINOMA  . BREAST BIOPSY Left 07/16/2017   [place] bx of left breast mass and left breast LN.  INVASIVE MAMMARY CARCINOMA, NO SPECIAL TYPE.   Marland Kitchen BREAST EXCISIONAL BIOPSY Right 2001  . BREAST LUMPECTOMY Right    2001  . BREAST LUMPECTOMY WITH SENTINEL LYMPH NODE BIOPSY Left 12/06/2017   Procedure: BREAST LUMPECTOMY WITH SENTINEL LYMPH NODE BX;  Surgeon: Robert Bellow, MD;  Location: [facility] ORS;  Service: General;  Laterality: Left;  . COLONOSCOPY    . PORTACATH PLACEMENT Right 08/07/2017   Procedure: INSERTION PORT-A-CATH;  Surgeon: Robert Bellow, MD;  Location: [facility] ORS;  Service: General;  Laterality: Right;  . SIMPLE MASTECTOMY WITH AXILLARY SENTINEL NODE BIOPSY Left 12/23/2017   Procedure: SIMPLE MASTECTOMY;  Surgeon: Robert Bellow, MD;  Location: [facility] ORS;  Service: General;  Laterality: Left;  . TONSILLECTOMY      FAMILY HISTORY: Family History  Problem Relation Age of Onset  . Stroke Mother   . Thyroid disease Mother   . Renal Disease Mother   . Stroke Father   . Heart attack Father   . Sudden death Father 64       suicide  . Anuerysm Brother   . Breast cancer Neg Hx     ADVANCED DIRECTIVES (Y/N):  N  HEALTH MAINTENANCE: Social History   Tobacco Use  . Smoking status: Current Some Day Smoker    Packs/day: 0.25  Years: 30.00    Pack years: 7.50    Types: Cigarettes  . Smokeless tobacco: Never Used  Substance Use Topics  . Alcohol use: Yes    Comment: occasional q 44mo  . Drug use: Never     Colonoscopy:  PAP:  Bone density:  Lipid panel:  Allergies  Allergen Reactions  . Sulfa Antibiotics Diarrhea    Current Outpatient Medications  Medication Sig Dispense Refill  . acetaminophen (TYLENOL) 500 MG tablet  Take 1,000 mg by mouth every 6 (six) hours as needed for moderate pain or headache.     . albuterol (PROVENTIL HFA;VENTOLIN HFA) 108 (90 Base) MCG/ACT inhaler Inhale 2 puffs into the lungs every 6 (six) hours as needed for wheezing or shortness of breath. 1 Inhaler 2  . ALPRAZolam (XANAX) 0.25 MG tablet Take 0.25 mg by mouth 2 (two) times daily.     .Marland KitchenamLODipine (NORVASC) 5 MG tablet Take 5 mg by mouth at bedtime.     .Marland Kitchenapixaban (ELIQUIS) 5 MG TABS tablet Take 2 tablets of 580mtwice a day for 1 week and then 1 tablet of 43m643mwice a day 74 tablet 1  . apixaban (ELIQUIS) 5 MG TABS tablet Take 5 mg by mouth 2 (two) times daily.    . aMarland Kitchenpirin EC 81 MG tablet Take 81 mg by mouth at bedtime.     . HMarland KitchenDROcodone-acetaminophen (NORCO/VICODIN) 5-325 MG tablet Take 1 tablet by mouth every 4 (four) hours as needed for moderate pain. 20 tablet 0  . levothyroxine (SYNTHROID, LEVOTHROID) 100 MCG tablet Take 100 mcg by mouth daily before breakfast.    . lidocaine-prilocaine (EMLA) cream Apply to affected area once 30 g 3  . loratadine (CLARITIN) 10 MG tablet Take 10 mg by mouth daily as needed for allergies.    . Lysine 500 MG CAPS Take 500 mg by mouth daily as needed (for fever blister).     . naproxen sodium (ALEVE) 220 MG tablet Take 220-440 mg by mouth 2 (two) times daily as needed (swelling/ pain).    . Potassium 99 MG TABS Take 99 mg by mouth at bedtime.     . traZODone (DESYREL) 50 MG tablet Take 50 mg by mouth at bedtime.    . lMarland Kitchentrozole (FEMARA) 2.5 MG tablet Take 1 tablet (2.5 mg total) by mouth daily. 30 tablet 3   No current facility-administered medications for this visit.     OBJECTIVE: Vitals:   01/20/18 1422  BP: (!) 158/91  Pulse: 77  Temp: 98.3 F (36.8 C)     Body mass index is 33.31 kg/m.    ECOG FS:0 - Asymptomatic  General: Well-developed, well-nourished, no acute distress. Eyes: Pink conjunctiva, anicteric sclera. HEENT: Normocephalic, moist mucous membranes. Breast: Left  mastectomy with well-healing scar, JP drain in place. Lungs: Clear to auscultation bilaterally. Heart: Regular rate and rhythm. No rubs, murmurs, or gallops. Abdomen: Soft, nontender, nondistended. No organomegaly noted, normoactive bowel sounds. Musculoskeletal: No edema, cyanosis, or clubbing. Neuro: Alert, answering all questions appropriately. Cranial nerves grossly intact. Skin: No rashes or petechiae noted. Psych: Normal affect.  LAB RESULTS:  Lab Results  Component Value Date   NA 138 01/12/2018   K 4.2 01/12/2018   CL 107 01/12/2018   CO2 24 01/12/2018   GLUCOSE 93 01/12/2018   BUN 22 01/12/2018   CREATININE 0.88 01/12/2018   CALCIUM 8.9 01/12/2018   PROT 7.2 01/10/2018   ALBUMIN 4.4 01/10/2018   AST 26 01/10/2018   ALT  24 01/10/2018   ALKPHOS 148 (H) 01/10/2018   BILITOT 0.5 01/10/2018   GFRNONAA >60 01/12/2018   GFRAA >60 01/12/2018    Lab Results  Component Value Date   WBC 5.1 01/12/2018   NEUTROABS 4.2 11/14/2017   HGB 12.2 01/12/2018   HCT 38.1 01/12/2018   MCV 90.9 01/12/2018   PLT 225 01/12/2018     STUDIES: Dg Chest 2 View  Result Date: 01/10/2018 CLINICAL DATA:  Chest pain. EXAM: CHEST - 2 VIEW COMPARISON:  Radiographs of October 04, 2017. FINDINGS: The heart size and mediastinal contours are within normal limits. Both lungs are clear. No pneumothorax or pleural effusion is noted. Right subclavian Port-A-Cath is unchanged in position. The visualized skeletal structures are unremarkable. IMPRESSION: No active cardiopulmonary disease. Electronically Signed   By: Marijo Conception, M.D.   On: 01/10/2018 20:25   Ct Angio Chest Pe W And/or Wo Contrast  Addendum Date: 01/11/2018   ADDENDUM REPORT: 01/11/2018 00:35 ADDENDUM: Critical Value/emergent results were called by telephone at the time of interpretation on 01/11/2018 at 12:25 am to Dr. Marjean Donna , who verbally acknowledged these results. Electronically Signed   By: Ulyses Jarred M.D.   On:  01/11/2018 00:35   Result Date: 01/11/2018 CLINICAL DATA:  Chest pain, dyspnea and palpitations EXAM: CT ANGIOGRAPHY CHEST WITH CONTRAST TECHNIQUE: Multidetector CT imaging of the chest was performed using the standard protocol during bolus administration of intravenous contrast. Multiplanar CT image reconstructions and MIPs were obtained to evaluate the vascular anatomy. CONTRAST:  44m ISOVUE-370 IOPAMIDOL (ISOVUE-370) INJECTION 76% COMPARISON:  CT chest 08/01/2017 FINDINGS: Cardiovascular: --Pulmonary arteries: Contrast injection is sufficient to demonstrate satisfactory opacification of the pulmonary arteries to the segmental level.There is a single filling defect within the left lower lobe lateral segment. Otherwise, normal arterial enhancement. There is no evidence of right heart strain. The main pulmonary artery is within normal limits for size. --Aorta: Satisfactory opacification of the thoracic aorta. No aortic dissection or other acute aortic syndrome. Conventional 3 vessel aortic branching pattern. The aortic course and caliber are normal. There is minimal aortic atherosclerosis. --Heart: Normal size. No pericardial effusion. Mediastinum/Nodes: No mediastinal, hilar or axillary lymphadenopathy. The visualized thyroid and thoracic esophageal course are unremarkable. Lungs/Pleura: Calcified granuloma in the posterior right lung base. No nodule or mass otherwise. No pleural effusion or pneumothorax. No focal airspace consolidation or pulmonary edema. Upper Abdomen: Contrast bolus timing is not optimized for evaluation of the abdominal organs. Within this limitation, the visualized organs of the upper abdomen are normal. Musculoskeletal: No chest wall abnormality. No acute or significant osseous findings. Review of the MIP images confirms the above findings. IMPRESSION: 1. Single, small acute pulmonary embolus within the left lower lobe lateral segmental artery. No evidence of right heart strain. 2. No  other acute thoracic abnormality. Aortic Atherosclerosis (ICD10-I70.0). Electronically Signed: By: KUlyses JarredM.D. On: 01/11/2018 00:12   [place]Venous Img Lower Bilateral  Result Date: 01/11/2018 CLINICAL DATA:  Lower extremity swelling. EXAM: BILATERAL LOWER EXTREMITY VENOUS DOPPLER ULTRASOUND TECHNIQUE: Gray-scale sonography with graded compression, as well as color Doppler and duplex ultrasound were performed to evaluate the lower extremity deep venous systems from the level of the common femoral vein and including the common femoral, femoral, profunda femoral, popliteal and calf veins including the posterior tibial, peroneal and gastrocnemius veins when visible. The superficial great saphenous vein was also interrogated. Spectral Doppler was utilized to evaluate flow at rest and with distal augmentation maneuvers in the common femoral,  femoral and popliteal veins. COMPARISON:  None. FINDINGS: RIGHT LOWER EXTREMITY Common Femoral Vein: No evidence of thrombus. Normal compressibility, respiratory phasicity and response to augmentation. Saphenofemoral Junction: No evidence of thrombus. Normal compressibility and flow on color Doppler imaging. Profunda Femoral Vein: No evidence of thrombus. Normal compressibility and flow on color Doppler imaging. Femoral Vein: No evidence of thrombus. Normal compressibility, respiratory phasicity and response to augmentation. Popliteal Vein: No evidence of thrombus. Normal compressibility, respiratory phasicity and response to augmentation. Calf Veins: No evidence of thrombus. Normal compressibility and flow on color Doppler imaging. Other Findings:  None. LEFT LOWER EXTREMITY Common Femoral Vein: No evidence of thrombus. Normal compressibility, respiratory phasicity and response to augmentation. Saphenofemoral Junction: No evidence of thrombus. Normal compressibility and flow on color Doppler imaging. Profunda Femoral Vein: No evidence of thrombus. Normal compressibility and  flow on color Doppler imaging. Femoral Vein: No evidence of thrombus. Normal compressibility, respiratory phasicity and response to augmentation. Popliteal Vein: No evidence of thrombus. Normal compressibility, respiratory phasicity and response to augmentation. Calf Veins: No evidence of thrombus. Normal compressibility and flow on color Doppler imaging. Other Findings:  None. IMPRESSION: No evidence of deep venous thrombosis in the lower extremities. Electronically Signed   By: Markus Daft M.D.   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  PLAN:    1. 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  Case was also discussed extensively at case conference.  Patient received neoadjuvant chemotherapy with Adriamycin and Cytoxan.  She only received 4 cycles of weekly Taxol prior to discontinuation of treatment on October 31, 2017 secondary to persistent peripheral neuropathy.  She ultimately required mastectomy and final pathology noted 5 of 6 lymph nodes positive for disease.  Given her axillary disease, she will likely require adjuvant XRT and a referral was given to radiation oncology.  Patient was also given a prescription for letrozole today which she will require to take for a minimum of 5 years completing treatment in January 2025.  Will get a baseline bone mineral density in the next 1 to 2 weeks.  Return to clinic in 6 weeks for routine evaluation. 2.  Anemia: Resolved.  3.  Peripheral neuropathy: Mildly improved, monitor. 4.  Pulmonary embolus: Continue Eliquis.  Patient will require approximately 3 months of treatment completing in approximately April 2020.  I spent a total of 30 minutes face-to-face with the patient of which greater than 50% of the visit was spent in counseling  and coordination of care as detailed above.  Patient expressed understanding and was in agreement with this plan. She also understands that She can call clinic at any time with any questions, concerns, or complaints.   Cancer Staging Breast cancer, stage 2, left ([place]) Staging form: Breast, AJCC 8th Edition - Clinical stage from 07/30/2017: Stage IIA (cT2, cN1, cM0, G2, ER+, PR+, HER2-) - Signed by Lloyd Huger, MD on 07/30/2017   Lloyd Huger, MD   01/22/2018 12:20 PM

## 2018-01-20 ENCOUNTER — Inpatient Hospital Stay: Payer: Medicare Other | Attending: Oncology | Admitting: Oncology

## 2018-01-20 ENCOUNTER — Inpatient Hospital Stay: Payer: Medicare Other

## 2018-01-20 ENCOUNTER — Encounter: Payer: Self-pay | Admitting: Surgery

## 2018-01-20 ENCOUNTER — Other Ambulatory Visit: Payer: Self-pay

## 2018-01-20 VITALS — BP 158/91 | HR 77 | Temp 98.3°F | Ht 66.0 in | Wt 206.4 lb

## 2018-01-20 DIAGNOSIS — C50412 Malignant neoplasm of upper-outer quadrant of left female breast: Secondary | ICD-10-CM | POA: Insufficient documentation

## 2018-01-20 DIAGNOSIS — Z7982 Long term (current) use of aspirin: Secondary | ICD-10-CM | POA: Diagnosis not present

## 2018-01-20 DIAGNOSIS — T451X5A Adverse effect of antineoplastic and immunosuppressive drugs, initial encounter: Secondary | ICD-10-CM | POA: Diagnosis not present

## 2018-01-20 DIAGNOSIS — Z170 Estrogen receptor positive status [ER+]: Secondary | ICD-10-CM | POA: Insufficient documentation

## 2018-01-20 DIAGNOSIS — Z720 Tobacco use: Secondary | ICD-10-CM

## 2018-01-20 DIAGNOSIS — Z7901 Long term (current) use of anticoagulants: Secondary | ICD-10-CM | POA: Diagnosis not present

## 2018-01-20 DIAGNOSIS — C50912 Malignant neoplasm of unspecified site of left female breast: Secondary | ICD-10-CM

## 2018-01-20 DIAGNOSIS — I2699 Other pulmonary embolism without acute cor pulmonale: Secondary | ICD-10-CM | POA: Diagnosis not present

## 2018-01-20 DIAGNOSIS — Z79899 Other long term (current) drug therapy: Secondary | ICD-10-CM | POA: Diagnosis not present

## 2018-01-20 DIAGNOSIS — F17210 Nicotine dependence, cigarettes, uncomplicated: Secondary | ICD-10-CM | POA: Diagnosis not present

## 2018-01-20 DIAGNOSIS — R5382 Chronic fatigue, unspecified: Secondary | ICD-10-CM | POA: Diagnosis not present

## 2018-01-20 DIAGNOSIS — Z9221 Personal history of antineoplastic chemotherapy: Secondary | ICD-10-CM | POA: Diagnosis not present

## 2018-01-20 DIAGNOSIS — I10 Essential (primary) hypertension: Secondary | ICD-10-CM | POA: Diagnosis not present

## 2018-01-20 DIAGNOSIS — R531 Weakness: Secondary | ICD-10-CM | POA: Diagnosis not present

## 2018-01-20 DIAGNOSIS — G620 Drug-induced polyneuropathy: Secondary | ICD-10-CM

## 2018-01-20 DIAGNOSIS — Z9012 Acquired absence of left breast and nipple: Secondary | ICD-10-CM | POA: Diagnosis not present

## 2018-01-20 MED ORDER — LETROZOLE 2.5 MG PO TABS: 2.5000 mg | ORAL_TABLET | Freq: Every day | ORAL | 3 refills | Status: DC

## 2018-01-20 NOTE — Progress Notes (Signed)
Surgical Clinic Progress/Follow-up Note   HPI:  68 y.o. Female returns to clinic today for subsequent wound check and evaluation for possible removal of Left mastectomy site drain 24 Days s/p Left mastectomy ([facility], 12/23/2017) for positive margins following recent post-neoadjuvant Left breast lumpectomy with sentinel lymph node biopsy, axillary lymph node dissection (12/06/2017). While her peri-incisional pain has improved, her non-bloody drain output has decreased somewhat, it remains >30 mL per day. Also during the interval period since her last appointment, patient was diagnosed with PE and started on Eliquis therapeutic anticoagulation. She currently denies any fever/chills, CP, or SOB.  Review of Systems:  Constitutional: denies fever/chills  Respiratory: denies shortness of breath, wheezing  Cardiovascular: denies chest pain, palpitations  Breast: pain, wound, and drainage as per interval history Skin: Denies any other rashes or skin discolorations except post-surgical wounds as per interval history  Vital Signs:  BP (!) 155/94   Pulse 89   Temp 97.7 F (36.5 C) (Temporal)   Resp 18   Ht 5\' 6"  (1.676 m)   Wt 207 lb 6.4 oz (94.1 kg)   SpO2 96%   BMI 33.48 kg/m    Physical Exam:  Constitutional:  -- Overweight body habitus  -- Awake, alert, and oriented x3  Pulmonary:  -- No crackles -- Equal breath sounds bilaterally -- Breathing non-labored at rest Cardiovascular:  -- S1, S2 present  -- No pericardial rubs  Breast:  Non-tender to palpation with unchanged small amount of dried dark maroon/brown non-purulent blood at medial third of Left chest wall mastectomy wound, not associated with any underlying hematoma or peri-incisional erythema or purulent drainage -- Post-surgical drain well-secured with transparent yellow fluid in tubing and bulb  Musculoskeletal / Integumentary:  -- Wounds or skin discoloration: None appreciated except post-surgical incisions as described  above (Breast) -- Extremities: B/L UE and LE FROM, hands and feet warm, no edema (specifically no obvious LUE lymphedema at this time)  Imaging:  CTA Chest (01/10/2018) 1. Single, small acute pulmonary embolus within the left lower lobe lateral segmental artery. No evidence of right heart strain. 2. No other acute thoracic abnormality. 3. Aortic Atherosclerosis   B/L Lower Extremity Venous Doppler Ultrasound (01/11/2018) No evidence of deep venous thrombosis in the lower extremities.  Assessment:  68 y.o. yo Female with a problem list including...  Patient Active Problem List   Diagnosis Date Noted  . Pulmonary embolism ([place]) 01/11/2018  . Sepsis ([place]) 10/05/2017  . Breast cancer, stage 2, left ([place]) 07/24/2017  . Elevated troponin 02/17/2015    returns to clinic today for subsequent wound check and evaluation for possible removal of Left mastectomy site drain 24 Days s/p Left mastectomy ([facility], 12/23/2017) for positive margins following recent post-neoadjuvant Left breast lumpectomy with sentinel lymph node biopsy, axillary lymph node dissection (38/25/0539), complicated by anticoagulation for recent single, small Left PE.  Plan:  - discussed exam findings  - anticoagulation as per hematology-oncology - okay to continue to shower, apply breast binder  - removal of drain today vs reassessment in 1 more week discussed             - patient expresses she'd prefer to keep surgically placed drain for 1 more week and reassess - return to clinic next week to reassess once more for drain removal             - instructed to call office if any questions or concerns  All of the above recommendations were discussed with the patient, and all  of patient's questions were answered to her expressed satisfaction.  -- Marilynne Drivers Rosana Hoes, MD, [place]: [facility] - Partnering for exceptional care. Office: [phone]

## 2018-01-20 NOTE — Progress Notes (Signed)
Patient is here today to follow up on her breast cancer, left. Patient had her left breast mastectomy done on 12/23/2017 by Dr. Bary Castilla. Patient would want to know if you would be the one to change her Eliquis to something else since her insurance doesn't want to cover it. Patient also stated that she had been with a sinus congestion for about 10 days.

## 2018-01-21 ENCOUNTER — Encounter: Payer: Self-pay | Admitting: Surgery

## 2018-01-21 ENCOUNTER — Other Ambulatory Visit: Payer: Self-pay

## 2018-01-21 ENCOUNTER — Ambulatory Visit (INDEPENDENT_AMBULATORY_CARE_PROVIDER_SITE_OTHER): Payer: Medicare Other | Admitting: Surgery

## 2018-01-21 VITALS — BP 143/85 | HR 82 | Temp 97.7°F | Resp 16 | Ht 66.0 in | Wt 207.0 lb

## 2018-01-21 DIAGNOSIS — Z4889 Encounter for other specified surgical aftercare: Secondary | ICD-10-CM

## 2018-01-21 NOTE — Progress Notes (Signed)
Surgical Clinic Progress/Follow-up Note   HPI:  68 y.o. Female returns to clinic today for subsequent wound check and evaluation for possible removal of Left mastectomy site drain 24 Days s/p Left mastectomy ([facility],12/23/2017)for positive margins following recent post-neoadjuvant Left breast lumpectomy with sentinel lymph node biopsy, axillary lymph node dissection (12/06/2017). While she continues to recover and heal well with minimal remaining peri-incisional pain, her surgically placed drain has continued to drain ~50 mL of clear - yellow fluid daily with >70 mL drained this past Saturday, 1/4 (3 days ago). Patient states she has an upcoming initial consultation with radiation oncology on 1/13. Patient otherwise denies any fever/chills, N/V, CP, or SOB.  Review of Systems:  Constitutional: denies fever/chills  Respiratory: denies shortness of breath, wheezing  Cardiovascular: denies chest pain, palpitations  Breast: pain, wound, and drainage as per interval history Skin: Denies any other rashes or skin discolorations except post-surgical wounds as per interval history  Vital Signs:  BP (!) 143/85   Pulse 82   Temp 97.7 F (36.5 C) (Temporal)   Resp 16   Ht 5\' 6"  (1.676 m)   Wt 207 lb (93.9 kg)   SpO2 98%   BMI 33.41 kg/m    Physical Exam:  Constitutional:  -- Overweight body habitus  -- Awake, alert, and oriented x3  Pulmonary:  -- No crackles -- Equal breath sounds bilaterally -- Breathing non-labored at rest Cardiovascular:  -- S1, S2 present  -- No pericardial rubs  Breast:  Non-tender to palpationwith unchanged small amount of dried dark maroon/brown non-purulent blood at medial third of Left chest wall mastectomy wound, not associated with any underlying hematoma or peri-incisional erythema or purulent drainage -- Post-surgicaldrain well-secured with transparent yellow fluid in tubing and bulb  Musculoskeletal / Integumentary:  -- Wounds or skin discoloration:  None appreciated except post-surgical incisions as described above (Breast) -- Extremities: B/L UE and LE FROM, hands and feet warm, no edema (specifically no obvious LUE lymphedema at this time)   Imaging: No new pertinent imaging available for review  Assessment:  68 y.o. yo Female with a problem list including...  Patient Active Problem List   Diagnosis Date Noted  . Pulmonary embolism ([place]) 01/11/2018  . Sepsis ([place]) 10/05/2017  . Breast cancer, stage 2, left ([place]) 07/24/2017  . Elevated troponin 02/17/2015    returns to clinic today for persistent non-purulent drainage from surgically-placed Left inferior flap-axillary drain 24 Days s/p Left mastectomy ([facility],12/23/2017)for positive margins following recent post-partial neoadjuvant Left breast lumpectomy + sentinel lymph node biopsy, axillary lymph node dissection (40/81/4481), complicated by anticoagulation for recent single, small Left PE.  Plan:              - continue drain for now             - anticoagulation as per hematology-oncology - will reiterate importance of compressive breast binder  - gradually resume all activities without restrictions over next 2 weeks             - okay tocontinue to shower, but do not submerge incisions under water until healed             - apply sunblock particularly to incisions with sun exposure to reduce pigmentation of scars  - discussed with Dr. Bary Castilla and Dr. Baruch Gouty, anticipate decreased drainage prior to initiation of radiation therapy             - return to clinic in 1 month unless drainage substantially decreases sooner  -  instructed to call office if any questions or concerns  All of the above recommendations were discussed with the patient, and all of patient's questions were answered to her expressed satisfaction.  -- Marilynne Drivers Rosana Hoes, MD, [place]: [facility] - Partnering for exceptional care. Office: [phone]

## 2018-01-21 NOTE — Patient Instructions (Addendum)
Please keep recording the drain output.   Please see your follow up appointment listed below.

## 2018-01-22 ENCOUNTER — Encounter: Payer: Self-pay | Admitting: Surgery

## 2018-01-24 DIAGNOSIS — F410 Panic disorder [episodic paroxysmal anxiety] without agoraphobia: Secondary | ICD-10-CM | POA: Diagnosis not present

## 2018-01-24 DIAGNOSIS — R002 Palpitations: Secondary | ICD-10-CM | POA: Diagnosis not present

## 2018-01-24 DIAGNOSIS — I4891 Unspecified atrial fibrillation: Secondary | ICD-10-CM | POA: Diagnosis not present

## 2018-01-24 DIAGNOSIS — R011 Cardiac murmur, unspecified: Secondary | ICD-10-CM | POA: Diagnosis not present

## 2018-01-27 ENCOUNTER — Other Ambulatory Visit: Payer: Self-pay

## 2018-01-27 ENCOUNTER — Encounter: Payer: Self-pay | Admitting: Radiation Oncology

## 2018-01-27 ENCOUNTER — Ambulatory Visit
Admission: RE | Admit: 2018-01-27 | Discharge: 2018-01-27 | Disposition: A | Discharge status: Home / Self Care | Payer: Medicare Other | Source: Ambulatory Visit | Attending: Radiation Oncology | Admitting: Radiation Oncology

## 2018-01-27 VITALS — BP 151/88 | HR 85 | Temp 96.2°F | Resp 16 | Wt 202.3 lb

## 2018-01-27 DIAGNOSIS — C773 Secondary and unspecified malignant neoplasm of axilla and upper limb lymph nodes: Secondary | ICD-10-CM | POA: Diagnosis not present

## 2018-01-27 DIAGNOSIS — I10 Essential (primary) hypertension: Secondary | ICD-10-CM | POA: Insufficient documentation

## 2018-01-27 DIAGNOSIS — Z7982 Long term (current) use of aspirin: Secondary | ICD-10-CM | POA: Diagnosis not present

## 2018-01-27 DIAGNOSIS — Z79899 Other long term (current) drug therapy: Secondary | ICD-10-CM | POA: Insufficient documentation

## 2018-01-27 DIAGNOSIS — J069 Acute upper respiratory infection, unspecified: Secondary | ICD-10-CM | POA: Diagnosis not present

## 2018-01-27 DIAGNOSIS — C50912 Malignant neoplasm of unspecified site of left female breast: Secondary | ICD-10-CM

## 2018-01-27 DIAGNOSIS — Z170 Estrogen receptor positive status [ER+]: Secondary | ICD-10-CM | POA: Diagnosis not present

## 2018-01-27 DIAGNOSIS — F329 Major depressive disorder, single episode, unspecified: Secondary | ICD-10-CM | POA: Diagnosis not present

## 2018-01-27 DIAGNOSIS — Z7901 Long term (current) use of anticoagulants: Secondary | ICD-10-CM | POA: Insufficient documentation

## 2018-01-27 DIAGNOSIS — Z79811 Long term (current) use of aromatase inhibitors: Secondary | ICD-10-CM | POA: Diagnosis not present

## 2018-01-27 DIAGNOSIS — Z9012 Acquired absence of left breast and nipple: Secondary | ICD-10-CM | POA: Insufficient documentation

## 2018-01-27 DIAGNOSIS — F17210 Nicotine dependence, cigarettes, uncomplicated: Secondary | ICD-10-CM | POA: Insufficient documentation

## 2018-01-27 DIAGNOSIS — C50412 Malignant neoplasm of upper-outer quadrant of left female breast: Secondary | ICD-10-CM | POA: Diagnosis not present

## 2018-01-27 DIAGNOSIS — E039 Hypothyroidism, unspecified: Secondary | ICD-10-CM | POA: Diagnosis not present

## 2018-01-27 DIAGNOSIS — Z9221 Personal history of antineoplastic chemotherapy: Secondary | ICD-10-CM | POA: Insufficient documentation

## 2018-01-27 DIAGNOSIS — R001 Bradycardia, unspecified: Secondary | ICD-10-CM | POA: Diagnosis not present

## 2018-01-27 NOTE — Consult Note (Signed)
NEW PATIENT EVALUATION  Name: Alicia [name]  MRN: [id number]  Date:   01/27/2018     DOB: 24-Apr-1950   This 68 y.o. 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 All.  REFERRING PHYSICIAN: [name], MD  CHIEF COMPLAINT:  Chief Complaint  Patient presents with  . Breast Cancer    Initial consultation    DIAGNOSIS: The encounter diagnosis was Breast cancer, stage 2, left ([place]).   PREVIOUS INVESTIGATIONS:  Mammogram ultrasound reviewed Pathology reports reviewed Clinical notes reviewed  HPI: patient is a 68 year old female who presented with a self discovered mass in her left breast.ultrasound and mammography confirmed a 3.6 x 2.4 cm lesion at the 2:30 position of the left breast 5 cm from the nipple. There are also abnormal enlarged lymph nodes the largest measuring 2.9 cm.should would ultrasound-guided biopsy showing 15 mm of invasive mammary carcinoma ER/PR positive HER-2/neu not overexpressed.one lymph node was also targeted showing metastatic mammary carcinoma at least 9 mm.CT scan of the abdomen chest and pelvis also confirmed known breast primary and left axillary nodal metastasis.patient received Cytoxan and Adriamycin and Taxol with Taxol discontinued early secondary to peripheral neuropathy.she went on to have a left wide local excision based on fairly weak treatment response to chemotherapy.surgical pathology showed 6 of 7 lymph nodes involved by macro metastatic carcinoma with extracapsular extension present.tumor measured 4.3 cm with initial pathology overall dimension was closer to 5 cm. Tumor was overall grade 2. 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 There were positive margins for her invasive  component also.lymph vascular invasion was present.she underwent a left modified radical mastectomy based on multiple positive margins with residual carcinoma seen.invasive carcinoma involves the muscles of the nipple areolar complex deep margin was negative for carcinoma.She is seen today for consideration of adjuvant radiation therapy. She still has a drain placed. She is healing fairly well although granulation tissue is present in the scar site. She also has a upper respiratory infection and some problems with hypertension today.  PLANNED TREATMENT REGIMEN: left chest wall peripheral lymphatic radiation  PAST MEDICAL HISTORY:  has a past medical history of Anxiety, Cancer ([place]) (07/2017), Depression, Hypertension, Hypothyroidism, Rapid heart rate, and Thyroid disease.    PAST SURGICAL HISTORY:  Past Surgical History:  Procedure Laterality Date  . AXILLARY LYMPH NODE BIOPSY Left 07/16/2017   METASTATIC MAMMARY CARCINOMA  . BREAST BIOPSY Left 07/16/2017   [place] bx of left breast mass and left breast LN.  INVASIVE MAMMARY CARCINOMA, NO SPECIAL TYPE.   Marland Kitchen BREAST EXCISIONAL BIOPSY Right 2001  . BREAST LUMPECTOMY Right    2001  . BREAST LUMPECTOMY WITH SENTINEL LYMPH NODE BIOPSY Left 12/06/2017   Procedure: BREAST LUMPECTOMY WITH SENTINEL LYMPH NODE BX;  Surgeon: Robert Bellow, MD;  Location: [facility] ORS;  Service: General;  Laterality: Left;  . COLONOSCOPY    . PORTACATH PLACEMENT Right 08/07/2017   Procedure: INSERTION PORT-A-CATH;  Surgeon: Robert Bellow, MD;  Location: [facility] ORS;  Service: General;  Laterality: Right;  . SIMPLE MASTECTOMY WITH AXILLARY SENTINEL NODE BIOPSY Left 12/23/2017   Procedure: SIMPLE MASTECTOMY;  Surgeon: Robert Bellow, MD;  Location: [facility] ORS;  Service: General;  Laterality: Left;  . TONSILLECTOMY      FAMILY HISTORY: family  history includes Anuerysm in her brother; Heart attack in her father; Renal Disease in her mother; Stroke in her father and mother;  Sudden death (age of onset: 65) in her father; Thyroid disease in her mother.  SOCIAL HISTORY:  reports that she has been smoking cigarettes. She has a 7.50 pack-year smoking history. She has never used smokeless tobacco. She reports current alcohol use. She reports that she does not use drugs.  ALLERGIES: Sulfa antibiotics  MEDICATIONS:  Current Outpatient Medications  Medication Sig Dispense Refill  . acetaminophen (TYLENOL) 500 MG tablet Take 1,000 mg by mouth every 6 (six) hours as needed for moderate pain or headache.     . albuterol (PROVENTIL HFA;VENTOLIN HFA) 108 (90 Base) MCG/ACT inhaler Inhale 2 puffs into the lungs every 6 (six) hours as needed for wheezing or shortness of breath. 1 Inhaler 2  . ALPRAZolam (XANAX) 0.25 MG tablet Take 0.25 mg by mouth 2 (two) times daily.     Marland Kitchen amLODipine (NORVASC) 5 MG tablet Take 5 mg by mouth at bedtime.     Marland Kitchen apixaban (ELIQUIS) 5 MG TABS tablet Take 2 tablets of 84m twice a day for 1 week and then 1 tablet of 544mtwice a day 74 tablet 1  . apixaban (ELIQUIS) 5 MG TABS tablet Take 5 mg by mouth 2 (two) times daily.    . Marland Kitchenspirin EC 81 MG tablet Take 81 mg by mouth at bedtime.     . Marland KitchenuPROPion (WELLBUTRIN SR) 150 MG 12 hr tablet Take 150 mg by mouth 2 (two) times daily.    . Marland KitchenYDROcodone-acetaminophen (NORCO/VICODIN) 5-325 MG tablet Take 1 tablet by mouth every 4 (four) hours as needed for moderate pain. 20 tablet 0  . levothyroxine (SYNTHROID, LEVOTHROID) 100 MCG tablet Take 100 mcg by mouth daily before breakfast.    . lidocaine-prilocaine (EMLA) cream Apply to affected area once 30 g 3  . lisinopril (PRINIVIL,ZESTRIL) 10 MG tablet Take 10 mg by mouth daily.    . Marland Kitchenoratadine (CLARITIN) 10 MG tablet Take 10 mg by mouth daily as needed for allergies.    . Lysine 500 MG CAPS Take 500 mg by mouth daily as needed (for fever blister).     . naproxen sodium (ALEVE) 220 MG tablet Take 220-440 mg by mouth 2 (two) times daily as needed (swelling/ pain).    .  Potassium 99 MG TABS Take 99 mg by mouth at bedtime.     . traZODone (DESYREL) 50 MG tablet Take 50 mg by mouth at bedtime.    . Marland Kitchenetrozole (FEMARA) 2.5 MG tablet Take 1 tablet (2.5 mg total) by mouth daily. (Patient not taking: Reported on 01/27/2018) 30 tablet 3   No current facility-administered medications for this encounter.     ECOG PERFORMANCE STATUS:  0 - Asymptomatic  REVIEW OF SYSTEMS:  Patient denies any weight loss, fatigue, weakness, fever, chills or night sweats. Patient denies any loss of vision, blurred vision. Patient denies any ringing  of the ears or hearing loss. No irregular heartbeat. Patient denies heart murmur or history of fainting. Patient denies any chest pain or pain radiating to her upper extremities. Patient denies any shortness of breath, difficulty breathing at night, cough or hemoptysis. Patient denies any swelling in the lower legs. Patient denies any nausea vomiting, vomiting of blood, or coffee ground material in the vomitus. Patient denies any stomach pain. Patient states has had normal bowel movements no significant constipation or diarrhea. Patient denies any dysuria, hematuria or significant  nocturia. Patient denies any problems walking, swelling in the joints or loss of balance. Patient denies any skin changes, loss of hair or loss of weight. Patient denies any excessive worrying or anxiety or significant depression. Patient denies any problems with insomnia. Patient denies excessive thirst, polyuria, polydipsia. Patient denies any swollen glands, patient denies easy bruising or easy bleeding. Patient denies any recent infections, allergies or URI. Patient "s visual fields have not changed significantly in recent time.    PHYSICAL EXAM: BP (!) 151/88 (BP Location: Right Arm, Patient Position: Sitting)   Pulse 85   Temp (!) 96.2 F (35.7 C) (Tympanic)   Resp 16   Wt 202 lb 4.4 oz (91.7 kg)   BMI 32.65 kg/m  Patient is status post left modified radical  mastectomy chest walls healing well with some granulation tissue present in the resection site. No mass or nodularity is noted on the chest wall. Right breast is free of dominant mass or nodularity in 2 positions examined. No axillary or supraclavicular adenopathy is identified. No swelling of her left upper extremity is noted.Well-developed well-nourished patient in NAD. HEENT reveals PERLA, EOMI, discs not visualized.  Oral cavity is clear. No oral mucosal lesions are identified. Neck is clear without evidence of cervical or supraclavicular adenopathy. Lungs are clear to A&P. Cardiac examination is essentially unremarkable with regular rate and rhythm without murmur rub or thrill. Abdomen is benign with no organomegaly or masses noted. Motor sensory and DTR levels are equal and symmetric in the upper and lower extremities. Cranial nerves II through XII are grossly intact. Proprioception is intact. No peripheral adenopathy or edema is identified. No motor or sensory levels are noted. Crude visual fields are within normal range.  LABORATORY DATA: pathology results reviewed    RADIOLOGY RESULTS:ultrasound and mammogram reviewed   IMPRESSION: stage IIB invasive mammary carcinoma the left breast status post neoadjuvant chemotherapy than left modified radical mastectomy with poor prognostic factors in 68 year old female tumor is ER/PR positive HER-2/neu not overexpressed  PLAN: at this time based on her poor prognostic factors including tumor size up to 5 cmmultiple positive axillary lymph nodes with extracapsular extension I would recommend adjuvant radiation therapy to her left chest wall and peripheral lymphatics. Would plan on delivering 5040 cGy in 28 fractions to both sites. Would also boost her scar another 1400 cGy using electron beam. Risks and benefits of treatment including skin reaction fatigue alteration of blood counts possible inclusion of superficial lung and possible chance of lymphedema of  her left upper extremity all were discussed in detail with the patient. She seems to comprehend my treatment plan well. I have personally set up and ordered CT simulation in about 2 weeks to allow further healing of her chest wall and removal of the drain at this at that time. Patient comprehends my treatment plan well.  I would like to take this opportunity to thank you for allowing me to participate in the care of your patient.Noreene Filbert, MD

## 2018-01-30 ENCOUNTER — Ambulatory Visit (INDEPENDENT_AMBULATORY_CARE_PROVIDER_SITE_OTHER): Payer: Medicare Other | Admitting: Surgery

## 2018-01-30 ENCOUNTER — Other Ambulatory Visit: Payer: Self-pay

## 2018-01-30 ENCOUNTER — Encounter: Payer: Self-pay | Admitting: Surgery

## 2018-01-30 VITALS — BP 134/84 | HR 74 | Temp 97.5°F | Resp 16 | Ht 66.0 in | Wt 206.0 lb

## 2018-01-30 DIAGNOSIS — C50912 Malignant neoplasm of unspecified site of left female breast: Secondary | ICD-10-CM

## 2018-01-30 DIAGNOSIS — Z4889 Encounter for other specified surgical aftercare: Secondary | ICD-10-CM

## 2018-01-30 NOTE — Progress Notes (Signed)
Surgical Clinic Progress/Follow-up Note   HPI:  68 y.o. Female presents to clinic for subsequent post-op follow-up and evaluation for possible removal of Left mastectomy site drain 33Days s/p Left mastectomy([facility],12/23/2017)for positive margins following recent post-neoadjuvant Left breast lumpectomy with sentinel lymph nodebiopsy, axillary lymph node dissection (12/06/2017). Patient reports that while her post-surgical drainage has slowed somewhat, it remains >40 mL per day. She otherwise describes she has been healing well and feeling well with complete resolution of peri-operative pain and denies fever/chills, CP, or SOB. She says she recently met with Dr. Baruch Gouty to discuss post-surgical radiation therapy.  Review of Systems:  Constitutional: denies fever/chills  Respiratory: denies shortness of breath, wheezing  Cardiovascular: denies chest pain, palpitations  Breast:pain, wound, and drainage as per interval history Skin: Denies any other rashes or skin discolorations except post-surgical wounds as per interval history  Vital Signs:  BP 134/84   Pulse 74   Temp (!) 97.5 F (36.4 C) (Temporal)   Resp 16   Ht _0  (1.676 m)   Wt 206 lb (93.4 kg)   SpO2 96%   BMI 33.25 kg/m    Physical Exam:  Constitutional:  --Overweightbody habitus  -- Awake, alert, and oriented x3  Pulmonary:  -- No crackles -- Equal breath sounds bilaterally -- Breathing non-labored at rest Cardiovascular:  -- S1, S2 present  -- No pericardial rubs Breast: -- Left breast non-tender to palpationwithpost-mastectomy surgical wound healing well without surrounding erythema or drainage -- Post-surgicaldrain well-secured with transparent yellow fluid in tubing and bulb Musculoskeletal / Integumentary:  -- Wounds or skin discoloration: None appreciated except post-surgical incisions as described above (Breast) -- Extremities: B/L UE and LE FROM, hands and feet warm, no edema(specifically  no obvious LUE lymphedema at this time)   Imaging: No new pertinent imaging available for review  Assessment:  68 y.o. yo Female with a problem list including...  Patient Active Problem List   Diagnosis Date Noted  . Pulmonary embolism ([place]) 01/11/2018  . Sepsis ([place]) 10/05/2017  . Breast cancer, stage 2, left ([place]) 07/24/2017  . Elevated troponin 02/17/2015    presents to clinic for persistent non-purulent drainage from surgically placed drain to Left mastectomy inferior mastectomy flap and axilla 33Days s/p Left mastectomy ([facility], 12/23/2017) for positive margins following recent post-neoadjuvant Left breast lumpectomy with sentinel lymph nodebiopsy and axillary lymph node dissection (62/56/3893), complicated by anticoagulation for recent small single Left pulmonary embolus.  Plan:   - continue drain for now - anticoagulation as per hematology-oncology - wear compressive breast binder every day until drain removed - okay tocontinue to shower, but do not submerge incisions under water until drain removed - apply sunblock particularly to incisions with sun exposure to reduce pigmentation of scars             - CT radiation field mapping in 2 weeks with radiation therapy to follow drain removal - return to clinic in 2 weeks unless drainage decreases substantially sooner             - instructed to call office if any questions or concerns  All of the above recommendations were discussed with the patient and patient's husband, and all of patient's and family's questions were answered to their expressed satisfaction.  -- Marilynne Drivers Rosana Hoes, MD, [place]: [facility] Partner[facility] for exceptional care. Office: [phone]

## 2018-01-30 NOTE — Patient Instructions (Signed)
Please see your follow up appointment listed below.  If your drainage amount is till the same please call our office and reschedule for the following week.   Please continue to record the amount.

## 2018-02-10 ENCOUNTER — Ambulatory Visit: Payer: Medicare Other

## 2018-02-12 ENCOUNTER — Telehealth: Payer: Self-pay | Admitting: *Deleted

## 2018-02-12 NOTE — Telephone Encounter (Signed)
Patient called and stated to call today if she is still draining more the 81ml, and patient stated that she is.

## 2018-02-12 NOTE — Telephone Encounter (Signed)
Appointment changed to 02-20-18 at 2:45 since drainage is over 30 ml a day.

## 2018-02-13 ENCOUNTER — Encounter: Payer: Self-pay | Admitting: Surgery

## 2018-02-19 ENCOUNTER — Telehealth: Payer: Self-pay | Admitting: General Surgery

## 2018-02-19 NOTE — Telephone Encounter (Signed)
When calling patient for reminder calls she said her drainage would need to be lower for them to remove the drain, patient has some questions about what shes been draining. Please call patient and advise.

## 2018-02-19 NOTE — Telephone Encounter (Signed)
Drainage amount has been, 35, 40, 10, 40, 30,  for the past 5 days. Her appt is for Thursday, does she need to keep this?

## 2018-02-20 ENCOUNTER — Encounter: Payer: Medicare Other | Admitting: General Surgery

## 2018-02-24 DIAGNOSIS — C50919 Malignant neoplasm of unspecified site of unspecified female breast: Secondary | ICD-10-CM | POA: Diagnosis not present

## 2018-02-24 DIAGNOSIS — R002 Palpitations: Secondary | ICD-10-CM | POA: Diagnosis not present

## 2018-02-24 DIAGNOSIS — F410 Panic disorder [episodic paroxysmal anxiety] without agoraphobia: Secondary | ICD-10-CM | POA: Diagnosis not present

## 2018-02-24 DIAGNOSIS — R011 Cardiac murmur, unspecified: Secondary | ICD-10-CM | POA: Diagnosis not present

## 2018-02-25 ENCOUNTER — Ambulatory Visit (INDEPENDENT_AMBULATORY_CARE_PROVIDER_SITE_OTHER): Payer: Medicare Other | Admitting: General Surgery

## 2018-02-25 ENCOUNTER — Other Ambulatory Visit: Payer: Self-pay

## 2018-02-25 ENCOUNTER — Encounter: Payer: Self-pay | Admitting: General Surgery

## 2018-02-25 ENCOUNTER — Ambulatory Visit: Payer: Medicare Other

## 2018-02-25 VITALS — BP 130/79 | HR 75 | Temp 97.7°F | Resp 16 | Ht 66.0 in | Wt 202.0 lb

## 2018-02-25 DIAGNOSIS — C50912 Malignant neoplasm of unspecified site of left female breast: Secondary | ICD-10-CM

## 2018-02-25 NOTE — Patient Instructions (Signed)
Return in one week. The patient is aware to call back for any questions or concerns. 

## 2018-02-25 NOTE — Progress Notes (Signed)
Patient ID: Alicia [name], female   DOB: [birth year], 68 y.o.   MRN: [id number]  Chief Complaint  Patient presents with  . Follow-up    left mastectomy     HPI Alicia [name] is a 68 y.o. female here today for her follow up left mastectomy done on 12/23/2017. Drain sheet present. Sister in law Alicia [name] is present at visit.  The patient was diagnosed with a pulmonary embolus in January 11, 2018 when she presented to the emergency room with shortness of breath.  This was almost 5 weeks after her mastectomy.  Drainage volume has finally slacked off, averaging less than 30 cc/day for the last 4 days.  HPI  Past Medical History:  Diagnosis Date  . Anxiety   . Cancer ([place]) 07/2017   left breast  . Depression   . Hypertension   . Hypothyroidism   . Rapid heart rate   . Thyroid disease     Past Surgical History:  Procedure Laterality Date  . AXILLARY LYMPH NODE BIOPSY Left 07/16/2017   METASTATIC MAMMARY CARCINOMA  . BREAST BIOPSY Left 07/16/2017   [place] bx of left breast mass and left breast LN.  INVASIVE MAMMARY CARCINOMA, NO SPECIAL TYPE.   Marland Kitchen BREAST EXCISIONAL BIOPSY Right 2001  . BREAST LUMPECTOMY Right    2001  . BREAST LUMPECTOMY WITH SENTINEL LYMPH NODE BIOPSY Left 12/06/2017   Procedure: BREAST LUMPECTOMY WITH SENTINEL LYMPH NODE BX;  Surgeon: Robert Bellow, MD;  Location: [facility] ORS;  Service: General;  Laterality: Left;  . COLONOSCOPY    . PORTACATH PLACEMENT Right 08/07/2017   Procedure: INSERTION PORT-A-CATH;  Surgeon: Robert Bellow, MD;  Location: [facility] ORS;  Service: General;  Laterality: Right;  . SIMPLE MASTECTOMY WITH AXILLARY SENTINEL NODE BIOPSY Left 12/23/2017   Procedure: SIMPLE MASTECTOMY;  Surgeon: Robert Bellow, MD;  Location: [facility] ORS;  Service: General;  Laterality: Left;  . TONSILLECTOMY      Family History  Problem Relation Age of Onset  . Stroke Mother   . Thyroid disease Mother   . Renal Disease Mother   . Stroke Father   . Heart  attack Father   . Sudden death Father 62       suicide  . Anuerysm Brother   . Breast cancer Neg Hx     Social History Social History   Tobacco Use  . Smoking status: Current Some Day Smoker    Packs/day: 0.25    Years: 30.00    Pack years: 7.50    Types: Cigarettes  . Smokeless tobacco: Never Used  Substance Use Topics  . Alcohol use: Yes    Comment: occasional q 32mo   . Drug use: Never    Allergies  Allergen Reactions  . Sulfa Antibiotics Diarrhea    Current Outpatient Medications  Medication Sig Dispense Refill  . acetaminophen (TYLENOL) 500 MG tablet Take 1,000 mg by mouth every 6 (six) hours as needed for moderate pain or headache.     . albuterol (PROVENTIL HFA;VENTOLIN HFA) 108 (90 Base) MCG/ACT inhaler Inhale 2 puffs into the lungs every 6 (six) hours as needed for wheezing or shortness of breath. 1 Inhaler 2  . ALPRAZolam (XANAX) 0.25 MG tablet Take 0.25 mg by mouth 2 (two) times daily.     Marland Kitchen amLODipine (NORVASC) 5 MG tablet Take 5 mg by mouth at bedtime.     Marland Kitchen apixaban (ELIQUIS) 5 MG TABS tablet Take 5 mg by mouth 2 (two) times daily.    Marland Kitchen  aspirin EC 81 MG tablet Take 81 mg by mouth at bedtime.     Marland Kitchen buPROPion (WELLBUTRIN SR) 150 MG 12 hr tablet Take 150 mg by mouth 2 (two) times daily.    Marland Kitchen HYDROcodone-acetaminophen (NORCO/VICODIN) 5-325 MG tablet Take 1 tablet by mouth every 4 (four) hours as needed for moderate pain. 20 tablet 0  . letrozole (FEMARA) 2.5 MG tablet Take 1 tablet (2.5 mg total) by mouth daily. 30 tablet 3  . levothyroxine (SYNTHROID, LEVOTHROID) 100 MCG tablet Take 100 mcg by mouth daily before breakfast.    . lidocaine-prilocaine (EMLA) cream Apply to affected area once 30 g 3  . lisinopril (PRINIVIL,ZESTRIL) 10 MG tablet Take 10 mg by mouth daily.    Marland Kitchen loratadine (CLARITIN) 10 MG tablet Take 10 mg by mouth daily as needed for allergies.    . Lysine 500 MG CAPS Take 500 mg by mouth daily as needed (for fever blister).     . naproxen sodium  (ALEVE) 220 MG tablet Take 220-440 mg by mouth 2 (two) times daily as needed (swelling/ pain).    . Potassium 99 MG TABS Take 99 mg by mouth at bedtime.     . traZODone (DESYREL) 50 MG tablet Take 50 mg by mouth at bedtime.     No current facility-administered medications for this visit.     Review of Systems Review of Systems  Constitutional: Negative.   Respiratory: Negative.   Cardiovascular: Negative.     Blood pressure 130/79, pulse 75, temperature 97.7 F (36.5 C), temperature source Skin, resp. rate 16, height 5\' 6"  (1.676 m), weight 202 lb (91.6 kg), SpO2 97 %.  Physical Exam Physical Exam Exam conducted with a chaperone present.  Constitutional:      Appearance: Normal appearance.  Chest:    Skin:    General: Skin is warm and dry.  Neurological:     Mental Status: She is alert and oriented to person, place, and time.    Data Reviewed Chest CT of January 11, 2018.  Drain recording sheet.  Assessment    Resolution of post mastectomy seroma.    Plan Return in one week. The patient is aware to call back for any questions or concerns. The patient was advised that if not infrequent to require an aspiration after the drain is first removed and that this should not concern her.  She will call if she is symptomatic prior to her scheduled follow-up.  HPI, Physical Exam, Assessment and Plan have been scribed under the direction and in the presence of Hervey Ard, MD.  Gaspar Cola, CMA  I have completed the exam and reviewed the above documentation for accuracy and completeness.  I agree with the above.  Haematologist has been used and any errors in dictation or transcription are unintentional.  Hervey Ard, M.D., F.A.C.S.  Alicia [name] 02/25/2018, 9:33 PM

## 2018-02-26 ENCOUNTER — Ambulatory Visit
Admission: RE | Admit: 2018-02-26 | Discharge: 2018-02-26 | Disposition: A | Discharge status: Home / Self Care | Payer: Medicare Other | Source: Ambulatory Visit | Attending: Oncology | Admitting: Oncology

## 2018-02-26 DIAGNOSIS — C50912 Malignant neoplasm of unspecified site of left female breast: Secondary | ICD-10-CM | POA: Insufficient documentation

## 2018-02-26 DIAGNOSIS — M8588 Other specified disorders of bone density and structure, other site: Secondary | ICD-10-CM | POA: Diagnosis not present

## 2018-02-26 DIAGNOSIS — M85852 Other specified disorders of bone density and structure, left thigh: Secondary | ICD-10-CM | POA: Diagnosis not present

## 2018-03-02 NOTE — Progress Notes (Signed)
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  INTERVAL HISTORY: Patient returns to clinic today for routine evaluation.  She is finally fully healed from her mastectomy and all drains have been removed.  She now plans to proceed with adjuvant XRT.  She currently feels well.  She does not complain of peripheral neuropathy today.  She has no neurologic complaints.  She continues to have chronic weakness and fatigue. She has a good appetite and denies weight loss. She has no chest pain or shortness of breath.  She denies any nausea, vomiting, constipation, or diarrhea.  She has no urinary complaints.  Patient offers no further specific complaints today.  REVIEW OF SYSTEMS:   Review of Systems  Constitutional: Positive for malaise/fatigue. Negative for fever and weight loss.  Respiratory: Negative.  Negative for cough and shortness of breath.   Cardiovascular: Negative.  Negative for chest pain and leg swelling.  Gastrointestinal: Negative.  Negative for abdominal pain and constipation.  Genitourinary: Negative.  Negative for dysuria and urgency.  Musculoskeletal: Negative.  Negative for back pain.  Skin: Negative.  Negative for rash.  Neurological: Positive for tingling, sensory change and weakness. Negative for dizziness, focal weakness and headaches.  Psychiatric/Behavioral: The patient is nervous/anxious.     As per HPI. Otherwise, a complete review of systems is negative.  PAST MEDICAL HISTORY: Past Medical History:  Diagnosis Date  . Anxiety   . Cancer ([place]) 07/2017   left breast  . Depression   . Hypertension    . Hypothyroidism   . Rapid heart rate   . Thyroid disease     PAST SURGICAL HISTORY: Past Surgical History:  Procedure Laterality Date  . AXILLARY LYMPH NODE BIOPSY Left 07/16/2017   METASTATIC MAMMARY CARCINOMA  . BREAST BIOPSY Left 07/16/2017   [place] bx of left breast mass and left breast LN.  INVASIVE MAMMARY CARCINOMA, NO SPECIAL TYPE.   Marland Kitchen BREAST EXCISIONAL BIOPSY Right 2001  . BREAST LUMPECTOMY Right    2001  . BREAST LUMPECTOMY WITH SENTINEL LYMPH NODE BIOPSY Left 12/06/2017   Procedure: BREAST LUMPECTOMY WITH SENTINEL LYMPH NODE BX;  Surgeon: Robert Bellow, MD;  Location: [facility] ORS;  Service: General;  Laterality: Left;  . COLONOSCOPY    . PORTACATH PLACEMENT Right 08/07/2017   Procedure: INSERTION PORT-A-CATH;  Surgeon: Robert Bellow, MD;  Location: [facility] ORS;  Service: General;  Laterality: Right;  . SIMPLE MASTECTOMY WITH AXILLARY SENTINEL NODE BIOPSY Left 12/23/2017   Procedure: SIMPLE MASTECTOMY;  Surgeon: Robert Bellow, MD;  Location: [facility] ORS;  Service: General;  Laterality: Left;  . TONSILLECTOMY      FAMILY HISTORY: Family History  Problem Relation Age of Onset  . Stroke Mother   . Thyroid disease Mother   . Renal Disease Mother   . Stroke Father   . Heart attack Father   . Sudden death Father 13       suicide  . Anuerysm Brother   . Breast cancer Neg Hx     ADVANCED DIRECTIVES (Y/N):  N  HEALTH MAINTENANCE: Social History   Tobacco Use  . Smoking status: Current Some Day Smoker    Packs/day: 0.25  Years: 30.00    Pack years: 7.50    Types: Cigarettes  . Smokeless tobacco: Never Used  Substance Use Topics  . Alcohol use: Yes    Comment: occasional q 53mo  . Drug use: Never     Colonoscopy:  PAP:  Bone density:  Lipid panel:  Allergies  Allergen Reactions  . Sulfa Antibiotics Diarrhea    Current Outpatient Medications  Medication Sig Dispense Refill  . acetaminophen (TYLENOL) 500 MG tablet Take 1,000 mg by mouth every 6  (six) hours as needed for moderate pain or headache.     . albuterol (PROVENTIL HFA;VENTOLIN HFA) 108 (90 Base) MCG/ACT inhaler Inhale 2 puffs into the lungs every 6 (six) hours as needed for wheezing or shortness of breath. 1 Inhaler 2  . ALPRAZolam (XANAX) 0.25 MG tablet Take 0.25 mg by mouth 2 (two) times daily.     .Marland KitchenamLODipine (NORVASC) 5 MG tablet Take 5 mg by mouth at bedtime.     .Marland Kitchenapixaban (ELIQUIS) 5 MG TABS tablet Take 5 mg by mouth 2 (two) times daily.    .Marland Kitchenaspirin EC 81 MG tablet Take 81 mg by mouth at bedtime.     .Marland KitchenbuPROPion (WELLBUTRIN SR) 150 MG 12 hr tablet Take 150 mg by mouth 2 (two) times daily.    .Marland KitchenHYDROcodone-acetaminophen (NORCO/VICODIN) 5-325 MG tablet Take 1 tablet by mouth every 4 (four) hours as needed for moderate pain. 20 tablet 0  . letrozole (FEMARA) 2.5 MG tablet Take 1 tablet (2.5 mg total) by mouth daily. 30 tablet 3  . levothyroxine (SYNTHROID, LEVOTHROID) 100 MCG tablet Take 100 mcg by mouth daily before breakfast.    . lidocaine-prilocaine (EMLA) cream Apply to affected area once 30 g 3  . lisinopril (PRINIVIL,ZESTRIL) 10 MG tablet Take 10 mg by mouth daily.    .Marland Kitchenloratadine (CLARITIN) 10 MG tablet Take 10 mg by mouth daily as needed for allergies.    . Lysine 500 MG CAPS Take 500 mg by mouth daily as needed (for fever blister).     . naproxen sodium (ALEVE) 220 MG tablet Take 220-440 mg by mouth 2 (two) times daily as needed (swelling/ pain).    . Potassium 99 MG TABS Take 99 mg by mouth at bedtime.     . traZODone (DESYREL) 50 MG tablet Take 50 mg by mouth at bedtime.     No current facility-administered medications for this visit.     OBJECTIVE: Vitals:   03/04/18 1052  BP: 119/77  Pulse: 91  Resp: 18  Temp: (!) 97 F (36.1 C)     Body mass index is 32.3 kg/m.    ECOG FS:0 - Asymptomatic  General: Well-developed, well-nourished, no acute distress. Eyes: Pink conjunctiva, anicteric sclera. HEENT: Normocephalic, moist mucous membranes. Breast:  Well-healed left mastectomy scar. Lungs: Clear to auscultation bilaterally. Heart: Regular rate and rhythm. No rubs, murmurs, or gallops. Abdomen: Soft, nontender, nondistended. No organomegaly noted, normoactive bowel sounds. Musculoskeletal: No edema, cyanosis, or clubbing. Neuro: Alert, answering all questions appropriately. Cranial nerves grossly intact. Skin: No rashes or petechiae noted. Psych: Normal affect.  LAB RESULTS:  Lab Results  Component Value Date   NA 138 01/12/2018   K 4.2 01/12/2018   CL 107 01/12/2018   CO2 24 01/12/2018   GLUCOSE 93 01/12/2018   BUN 22 01/12/2018   CREATININE 0.88 01/12/2018   CALCIUM 8.9 01/12/2018   PROT 7.2 01/10/2018   ALBUMIN 4.4 01/10/2018   AST 26 01/10/2018  ALT 24 01/10/2018   ALKPHOS 148 (H) 01/10/2018   BILITOT 0.5 01/10/2018   GFRNONAA >60 01/12/2018   GFRAA >60 01/12/2018    Lab Results  Component Value Date   WBC 5.1 01/12/2018   NEUTROABS 4.2 11/14/2017   HGB 12.2 01/12/2018   HCT 38.1 01/12/2018   MCV 90.9 01/12/2018   PLT 225 01/12/2018     STUDIES: Dg Bone Density  Result Date: 02/26/2018 EXAM: DUAL X-RAY ABSORPTIOMETRY (DXA) FOR BONE MINERAL DENSITY IMPRESSION: Technologist: MTB Your patient Tianna Baus completed a BMD test on 02/26/2018 using the [place] (analysis version: 14.10) manufactured by [employer]. The following summarizes the results of our evaluation. PATIENT BIOGRAPHICAL: Name: Kazoua, Gossen Patient ID: [id number] Birth Date: January 24, 1950 Height: 65.0 in. Gender: Female Exam Date: 02/26/2018 Weight: 196.0 lbs. Indications: Breast CA, Caucasian, History of Chemo, Postmenopausal Fractures: Treatments: Albuterol, Synthroid ASSESSMENT: The BMD measured at Femur Neck Left is 0.809 g/cm2 with a T-score of -1.6. This patient is considered osteopenic according to [facility]) criteria. The quality of the scan is good. Site Region Measured Measured WHO Young Adult BMD  Date       Age      Classification T-score AP Spine L1-L4 02/26/2018 68.7 Osteopenia -1.4 1.019 g/cm2 DualFemur Neck Left 02/26/2018 68.7 Osteopenia -1.6 0.809 g/cm2 DualFemur Total Mean 02/26/2018 68.7 Osteopenia -1.3 0.849 g/cm2 World Health Organization [facility]) criteria for post-menopausal, Caucasian Women: Normal:       T-score at or above -1 SD Osteopenia:   T-score between -1 and -2.5 SD Osteoporosis: T-score at or below -2.5 SD RECOMMENDATIONS: 1. All patients should optimize calcium and vitamin D intake. 2. Consider FDA-approved medical therapies in postmenopausal women and men aged 43 years and older, based on the following: a. A hip or vertebral(clinical or morphometric) fracture b. T-score < -2.5 at the femoral neck or spine after appropriate evaluation to exclude secondary causes c. Low bone mass (T-score between -1.0 and -2.5 at the femoral neck or spine) and a 10-year probability of a hip fracture > 3% or a 10-year probability of a major osteoporosis-related fracture > 20% based on the [place]-adapted WHO algorithm d. Clinician judgment and/or patient preferences may indicate treatment for people with 10-year fracture probabilities above or below these levels FOLLOW-UP: People with diagnosed cases of osteoporosis or at high risk for fracture should have regular bone mineral density tests. For patients eligible for Medicare, routine testing is allowed once every 2 years. The testing frequency can be increased to one year for patients who have rapidly progressing disease, those who are receiving or discontinuing medical therapy to restore bone mass, or have additional risk factors. I have reviewed this report, and agree with the above findings. [facility] Radiology Dear Lloyd Huger, Your patient Rosland Riding [facility] completed a FRAX assessment on 02/26/2018 using the [place] (analysis version: 14.10) manufactured by [employer]. The following summarizes the results of our evaluation. PATIENT  BIOGRAPHICAL: Name: Evvie, Behrmann Patient ID: [id number] Birth Date: 03-Sep-1950 Height:    65.0 in. Gender:     [age]    [age]       Weight:    196.0 lbs. Ethnicity:  White                            Exam Date: 02/26/2018 FRAX* RESULTS:  (version: 3.5) 10-year Probability of Fracture1  Major Osteoporotic Fracture2 Hip Fracture 9.6% 1.4% Population: [place] (Caucasian) Risk Factors: None Based on Femur (Left) Neck BMD 1 -The 10-year probability of fracture may be lower than reported if the patient has received treatment. 2 -Major Osteoporotic Fracture: Clinical Spine, Forearm, Hip or Shoulder *FRAX is a Materials engineer of the [employer] for Metabolic Bone Disease, a [place] (WHO) [facility]. ASSESSMENT: The probability of a major osteoporotic fracture is 9.6% within the next ten years. The probability of a hip fracture is 1.4% within the next ten years. . Electronically Signed   By: Lowella Grip III M.D.   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  PLAN:    1. 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  Case was also discussed extensively at case conference.  Patient received neoadjuvant chemotherapy with Adriamycin and Cytoxan.  She only received 4 cycles of weekly Taxol prior to discontinuation of treatment on October 31, 2017 secondary to persistent peripheral neuropathy.  She ultimately required mastectomy and final pathology noted 5 of 6 lymph nodes positive for disease.  Given her axillary disease, she plans to proceed with adjuvant XRT.  Previously, patient was given a prescription for letrozole but she did not initiate treatment.  She has been instructed to continue to hold treatment until she completes  XRT.  No further intervention is needed.  Return to clinic in 3 months for routine evaluation.   2.  Anemia: Resolved.  3.  Peripheral neuropathy: Patient does not complain of this today. 4.  Pulmonary embolus: Continue Eliquis.  Patient will require approximately a minimum of 3 months of treatment completing in approximately April 2020. 5.  Osteopenia: Bone mineral density from February 26, 2018 reported T score of -1.6.  Continue calcium and vitamin D supplementation.  Repeat in February 2021.   Patient expressed understanding and was in agreement with this plan. She also understands that She can call clinic at any time with any questions, concerns, or complaints.   Cancer Staging Breast cancer, stage 2, left ([place]) Staging form: Breast, AJCC 8th Edition - Clinical stage from 07/30/2017: Stage IIA (cT2, cN1, cM0, G2, ER+, PR+, HER2-) - Signed by Lloyd Huger, MD on 07/30/2017   Lloyd Huger, MD   03/05/2018 2:08 PM

## 2018-03-04 ENCOUNTER — Encounter: Payer: Self-pay | Admitting: Oncology

## 2018-03-04 ENCOUNTER — Encounter: Payer: Self-pay | Admitting: General Surgery

## 2018-03-04 ENCOUNTER — Other Ambulatory Visit: Payer: Self-pay

## 2018-03-04 ENCOUNTER — Ambulatory Visit (INDEPENDENT_AMBULATORY_CARE_PROVIDER_SITE_OTHER): Payer: Medicare Other | Admitting: General Surgery

## 2018-03-04 ENCOUNTER — Inpatient Hospital Stay: Payer: Medicare Other | Attending: Oncology | Admitting: Oncology

## 2018-03-04 VITALS — BP 119/77 | HR 91 | Temp 97.0°F | Resp 18 | Wt 200.1 lb

## 2018-03-04 VITALS — BP 98/66 | HR 93 | Temp 97.9°F | Resp 18 | Ht 66.0 in | Wt 202.0 lb

## 2018-03-04 DIAGNOSIS — Z79899 Other long term (current) drug therapy: Secondary | ICD-10-CM | POA: Insufficient documentation

## 2018-03-04 DIAGNOSIS — R5382 Chronic fatigue, unspecified: Secondary | ICD-10-CM | POA: Diagnosis not present

## 2018-03-04 DIAGNOSIS — Z7901 Long term (current) use of anticoagulants: Secondary | ICD-10-CM | POA: Insufficient documentation

## 2018-03-04 DIAGNOSIS — Z9012 Acquired absence of left breast and nipple: Secondary | ICD-10-CM | POA: Diagnosis not present

## 2018-03-04 DIAGNOSIS — R5383 Other fatigue: Secondary | ICD-10-CM | POA: Diagnosis not present

## 2018-03-04 DIAGNOSIS — Z170 Estrogen receptor positive status [ER+]: Secondary | ICD-10-CM | POA: Diagnosis not present

## 2018-03-04 DIAGNOSIS — I2699 Other pulmonary embolism without acute cor pulmonale: Secondary | ICD-10-CM | POA: Diagnosis not present

## 2018-03-04 DIAGNOSIS — Z7982 Long term (current) use of aspirin: Secondary | ICD-10-CM | POA: Insufficient documentation

## 2018-03-04 DIAGNOSIS — E039 Hypothyroidism, unspecified: Secondary | ICD-10-CM | POA: Diagnosis not present

## 2018-03-04 DIAGNOSIS — C50912 Malignant neoplasm of unspecified site of left female breast: Secondary | ICD-10-CM

## 2018-03-04 DIAGNOSIS — I10 Essential (primary) hypertension: Secondary | ICD-10-CM | POA: Insufficient documentation

## 2018-03-04 DIAGNOSIS — F17210 Nicotine dependence, cigarettes, uncomplicated: Secondary | ICD-10-CM | POA: Diagnosis not present

## 2018-03-04 DIAGNOSIS — M8580 Other specified disorders of bone density and structure, unspecified site: Secondary | ICD-10-CM | POA: Insufficient documentation

## 2018-03-04 DIAGNOSIS — C50412 Malignant neoplasm of upper-outer quadrant of left female breast: Secondary | ICD-10-CM | POA: Insufficient documentation

## 2018-03-04 NOTE — Patient Instructions (Addendum)
The patient is aware to call back for any questions or new concerns. Prescription for bra and prothesis

## 2018-03-04 NOTE — Progress Notes (Signed)
Patient ID: Alicia [name], female   DOB: 06/01/1950, 68 y.o.   MRN: [id number]  Chief Complaint  Patient presents with  . Routine Post Op    left mastectomy     HPI Alicia [name] is a 68 y.o. female here today for her follow up  left mastectomy done on 12/9/20020. Patient states she is doing well.  HPI  Past Medical History:  Diagnosis Date  . Anxiety   . Cancer ([place]) 07/2017   left breast  . Depression   . Hypertension   . Hypothyroidism   . Rapid heart rate   . Thyroid disease     Past Surgical History:  Procedure Laterality Date  . AXILLARY LYMPH NODE BIOPSY Left 07/16/2017   METASTATIC MAMMARY CARCINOMA  . BREAST BIOPSY Left 07/16/2017   [place] bx of left breast mass and left breast LN.  INVASIVE MAMMARY CARCINOMA, NO SPECIAL TYPE.   Marland Kitchen BREAST EXCISIONAL BIOPSY Right 2001  . BREAST LUMPECTOMY Right    2001  . BREAST LUMPECTOMY WITH SENTINEL LYMPH NODE BIOPSY Left 12/06/2017   Procedure: BREAST LUMPECTOMY WITH SENTINEL LYMPH NODE BX;  Surgeon: Robert Bellow, MD;  Location: [facility] ORS;  Service: General;  Laterality: Left;  . COLONOSCOPY    . PORTACATH PLACEMENT Right 08/07/2017   Procedure: INSERTION PORT-A-CATH;  Surgeon: Robert Bellow, MD;  Location: [facility] ORS;  Service: General;  Laterality: Right;  . SIMPLE MASTECTOMY WITH AXILLARY SENTINEL NODE BIOPSY Left 12/23/2017   Procedure: SIMPLE MASTECTOMY;  Surgeon: Robert Bellow, MD;  Location: [facility] ORS;  Service: General;  Laterality: Left;  . TONSILLECTOMY      Family History  Problem Relation Age of Onset  . Stroke Mother   . Thyroid disease Mother   . Renal Disease Mother   . Stroke Father   . Heart attack Father   . Sudden death Father 34       suicide  . Anuerysm Brother   . Breast cancer Neg Hx     Social History Social History   Tobacco Use  . Smoking status: Current Some Day Smoker    Packs/day: 0.25    Years: 30.00    Pack years: 7.50    Types: Cigarettes  . Smokeless tobacco:  Never Used  Substance Use Topics  . Alcohol use: Yes    Comment: occasional q 54mo   . Drug use: Never    Allergies  Allergen Reactions  . Sulfa Antibiotics Diarrhea    Current Outpatient Medications  Medication Sig Dispense Refill  . acetaminophen (TYLENOL) 500 MG tablet Take 1,000 mg by mouth every 6 (six) hours as needed for moderate pain or headache.     . albuterol (PROVENTIL HFA;VENTOLIN HFA) 108 (90 Base) MCG/ACT inhaler Inhale 2 puffs into the lungs every 6 (six) hours as needed for wheezing or shortness of breath. 1 Inhaler 2  . ALPRAZolam (XANAX) 0.25 MG tablet Take 0.25 mg by mouth 2 (two) times daily.     Marland Kitchen amLODipine (NORVASC) 5 MG tablet Take 5 mg by mouth at bedtime.     Marland Kitchen apixaban (ELIQUIS) 5 MG TABS tablet Take 5 mg by mouth 2 (two) times daily.    Marland Kitchen aspirin EC 81 MG tablet Take 81 mg by mouth at bedtime.     Marland Kitchen buPROPion (WELLBUTRIN SR) 150 MG 12 hr tablet Take 150 mg by mouth 2 (two) times daily.    Marland Kitchen HYDROcodone-acetaminophen (NORCO/VICODIN) 5-325 MG tablet Take 1 tablet by mouth every  4 (four) hours as needed for moderate pain. 20 tablet 0  . letrozole (FEMARA) 2.5 MG tablet Take 1 tablet (2.5 mg total) by mouth daily. 30 tablet 3  . levothyroxine (SYNTHROID, LEVOTHROID) 100 MCG tablet Take 100 mcg by mouth daily before breakfast.    . lidocaine-prilocaine (EMLA) cream Apply to affected area once 30 g 3  . lisinopril (PRINIVIL,ZESTRIL) 10 MG tablet Take 10 mg by mouth daily.    Marland Kitchen loratadine (CLARITIN) 10 MG tablet Take 10 mg by mouth daily as needed for allergies.    . Lysine 500 MG CAPS Take 500 mg by mouth daily as needed (for fever blister).     . naproxen sodium (ALEVE) 220 MG tablet Take 220-440 mg by mouth 2 (two) times daily as needed (swelling/ pain).    . Potassium 99 MG TABS Take 99 mg by mouth at bedtime.     . traZODone (DESYREL) 50 MG tablet Take 50 mg by mouth at bedtime.     No current facility-administered medications for this visit.     Review of  Systems Review of Systems  Constitutional: Negative.   Respiratory: Negative.   Cardiovascular: Negative.     Blood pressure 98/66, pulse 93, temperature 97.9 F (36.6 C), temperature source Skin, resp. rate 18, height 5\' 6"  (1.676 m), weight 202 lb (91.6 kg), SpO2 98 %.  Physical Exam Physical Exam Exam conducted with a chaperone present.  Constitutional:      Appearance: Normal appearance.  HENT:     Mouth/Throat:     Pharynx: No oropharyngeal exudate.  Eyes:     General: No scleral icterus.    Conjunctiva/sclera: Conjunctivae normal.  Neck:     Musculoskeletal: Neck supple.  Chest:       Comments: No seroma  . No visible asymmetry of the upper extremities.   Lymphadenopathy:     Cervical: No cervical adenopathy.  Skin:    General: Skin is warm and dry.  Neurological:     Mental Status: She is alert and oriented to person, place, and time.  Psychiatric:        Mood and Affect: Mood normal.       Assessment    Doing well post salvage mastectomy.    Plan  Follow up in one month RX for bra and prothesis provided.  HPI, Physical Exam, Assessment and Plan have been scribed under the direction and in the presence of Hervey Ard, MD.  Gaspar Cola, CMA  HPI, assessment, plan and physical exam has been scribed under the direction and in the presence of Robert Bellow, MD. Karie Fetch, RN  I have completed the exam and reviewed the above documentation for accuracy and completeness.  I agree with the above.  Haematologist has been used and any errors in dictation or transcription are unintentional.  Hervey Ard, M.D., F.A.C.S.   Forest Gleason Kemani Demarais 03/05/2018, 12:24 PM

## 2018-03-04 NOTE — Progress Notes (Signed)
Patient denies any concerns today.  

## 2018-03-06 ENCOUNTER — Ambulatory Visit
Admission: RE | Admit: 2018-03-06 | Discharge: 2018-03-06 | Disposition: A | Discharge status: Home / Self Care | Payer: Medicare Other | Source: Ambulatory Visit | Attending: Radiation Oncology | Admitting: Radiation Oncology

## 2018-03-06 DIAGNOSIS — C50412 Malignant neoplasm of upper-outer quadrant of left female breast: Secondary | ICD-10-CM | POA: Diagnosis not present

## 2018-03-06 DIAGNOSIS — Z510 Encounter for antineoplastic radiation therapy: Secondary | ICD-10-CM | POA: Diagnosis not present

## 2018-03-07 ENCOUNTER — Other Ambulatory Visit: Payer: Self-pay | Admitting: *Deleted

## 2018-03-07 DIAGNOSIS — C50412 Malignant neoplasm of upper-outer quadrant of left female breast: Secondary | ICD-10-CM | POA: Diagnosis not present

## 2018-03-07 DIAGNOSIS — Z510 Encounter for antineoplastic radiation therapy: Secondary | ICD-10-CM | POA: Diagnosis not present

## 2018-03-07 DIAGNOSIS — C50912 Malignant neoplasm of unspecified site of left female breast: Secondary | ICD-10-CM

## 2018-03-13 ENCOUNTER — Ambulatory Visit
Admission: RE | Admit: 2018-03-13 | Discharge: 2018-03-13 | Disposition: A | Discharge status: Home / Self Care | Payer: Medicare Other | Source: Ambulatory Visit | Attending: Radiation Oncology | Admitting: Radiation Oncology

## 2018-03-13 DIAGNOSIS — C50412 Malignant neoplasm of upper-outer quadrant of left female breast: Secondary | ICD-10-CM | POA: Diagnosis not present

## 2018-03-13 DIAGNOSIS — Z510 Encounter for antineoplastic radiation therapy: Secondary | ICD-10-CM | POA: Diagnosis not present

## 2018-03-16 DIAGNOSIS — Z923 Personal history of irradiation: Secondary | ICD-10-CM

## 2018-03-16 HISTORY — DX: Personal history of irradiation: Z92.3

## 2018-03-17 ENCOUNTER — Ambulatory Visit
Admission: RE | Admit: 2018-03-17 | Discharge: 2018-03-17 | Disposition: A | Discharge status: Home / Self Care | Payer: Medicare Other | Source: Ambulatory Visit | Attending: Radiation Oncology | Admitting: Radiation Oncology

## 2018-03-17 ENCOUNTER — Encounter: Payer: Self-pay | Admitting: General Surgery

## 2018-03-17 DIAGNOSIS — Z510 Encounter for antineoplastic radiation therapy: Secondary | ICD-10-CM | POA: Diagnosis not present

## 2018-03-17 DIAGNOSIS — C50412 Malignant neoplasm of upper-outer quadrant of left female breast: Secondary | ICD-10-CM | POA: Insufficient documentation

## 2018-03-18 ENCOUNTER — Ambulatory Visit
Admission: RE | Admit: 2018-03-18 | Discharge: 2018-03-18 | Disposition: A | Discharge status: Home / Self Care | Payer: Medicare Other | Source: Ambulatory Visit | Attending: Radiation Oncology | Admitting: Radiation Oncology

## 2018-03-18 DIAGNOSIS — C50412 Malignant neoplasm of upper-outer quadrant of left female breast: Secondary | ICD-10-CM | POA: Diagnosis not present

## 2018-03-18 DIAGNOSIS — Z510 Encounter for antineoplastic radiation therapy: Secondary | ICD-10-CM | POA: Diagnosis not present

## 2018-03-19 ENCOUNTER — Ambulatory Visit
Admission: RE | Admit: 2018-03-19 | Discharge: 2018-03-19 | Disposition: A | Discharge status: Home / Self Care | Payer: Medicare Other | Source: Ambulatory Visit | Attending: Radiation Oncology | Admitting: Radiation Oncology

## 2018-03-19 DIAGNOSIS — C50412 Malignant neoplasm of upper-outer quadrant of left female breast: Secondary | ICD-10-CM | POA: Diagnosis not present

## 2018-03-19 DIAGNOSIS — Z510 Encounter for antineoplastic radiation therapy: Secondary | ICD-10-CM | POA: Diagnosis not present

## 2018-03-20 ENCOUNTER — Ambulatory Visit
Admission: RE | Admit: 2018-03-20 | Discharge: 2018-03-20 | Disposition: A | Discharge status: Home / Self Care | Payer: Medicare Other | Source: Ambulatory Visit | Attending: Radiation Oncology | Admitting: Radiation Oncology

## 2018-03-20 DIAGNOSIS — C50412 Malignant neoplasm of upper-outer quadrant of left female breast: Secondary | ICD-10-CM | POA: Diagnosis not present

## 2018-03-20 DIAGNOSIS — Z510 Encounter for antineoplastic radiation therapy: Secondary | ICD-10-CM | POA: Diagnosis not present

## 2018-03-21 ENCOUNTER — Ambulatory Visit
Admission: RE | Admit: 2018-03-21 | Discharge: 2018-03-21 | Disposition: A | Discharge status: Home / Self Care | Payer: Medicare Other | Source: Ambulatory Visit | Attending: Radiation Oncology | Admitting: Radiation Oncology

## 2018-03-21 DIAGNOSIS — Z510 Encounter for antineoplastic radiation therapy: Secondary | ICD-10-CM | POA: Diagnosis not present

## 2018-03-21 DIAGNOSIS — C50412 Malignant neoplasm of upper-outer quadrant of left female breast: Secondary | ICD-10-CM | POA: Diagnosis not present

## 2018-03-24 ENCOUNTER — Ambulatory Visit
Admission: RE | Admit: 2018-03-24 | Discharge: 2018-03-24 | Disposition: A | Discharge status: Home / Self Care | Payer: Medicare Other | Source: Ambulatory Visit | Attending: Radiation Oncology | Admitting: Radiation Oncology

## 2018-03-24 DIAGNOSIS — R011 Cardiac murmur, unspecified: Secondary | ICD-10-CM | POA: Diagnosis not present

## 2018-03-24 DIAGNOSIS — I4891 Unspecified atrial fibrillation: Secondary | ICD-10-CM | POA: Diagnosis not present

## 2018-03-24 DIAGNOSIS — C50412 Malignant neoplasm of upper-outer quadrant of left female breast: Secondary | ICD-10-CM | POA: Diagnosis not present

## 2018-03-24 DIAGNOSIS — F410 Panic disorder [episodic paroxysmal anxiety] without agoraphobia: Secondary | ICD-10-CM | POA: Diagnosis not present

## 2018-03-24 DIAGNOSIS — R002 Palpitations: Secondary | ICD-10-CM | POA: Diagnosis not present

## 2018-03-24 DIAGNOSIS — Z510 Encounter for antineoplastic radiation therapy: Secondary | ICD-10-CM | POA: Diagnosis not present

## 2018-03-25 ENCOUNTER — Ambulatory Visit
Admission: RE | Admit: 2018-03-25 | Discharge: 2018-03-25 | Disposition: A | Discharge status: Home / Self Care | Payer: Medicare Other | Source: Ambulatory Visit | Attending: Radiation Oncology | Admitting: Radiation Oncology

## 2018-03-25 DIAGNOSIS — C50412 Malignant neoplasm of upper-outer quadrant of left female breast: Secondary | ICD-10-CM | POA: Diagnosis not present

## 2018-03-25 DIAGNOSIS — Z510 Encounter for antineoplastic radiation therapy: Secondary | ICD-10-CM | POA: Diagnosis not present

## 2018-03-26 ENCOUNTER — Ambulatory Visit
Admission: RE | Admit: 2018-03-26 | Discharge: 2018-03-26 | Disposition: A | Discharge status: Home / Self Care | Payer: Medicare Other | Source: Ambulatory Visit | Attending: Radiation Oncology | Admitting: Radiation Oncology

## 2018-03-26 DIAGNOSIS — C50412 Malignant neoplasm of upper-outer quadrant of left female breast: Secondary | ICD-10-CM | POA: Diagnosis not present

## 2018-03-26 DIAGNOSIS — Z510 Encounter for antineoplastic radiation therapy: Secondary | ICD-10-CM | POA: Diagnosis not present

## 2018-03-27 ENCOUNTER — Other Ambulatory Visit: Payer: Self-pay

## 2018-03-27 ENCOUNTER — Ambulatory Visit
Admission: RE | Admit: 2018-03-27 | Discharge: 2018-03-27 | Disposition: A | Discharge status: Home / Self Care | Payer: Medicare Other | Source: Ambulatory Visit | Attending: Radiation Oncology | Admitting: Radiation Oncology

## 2018-03-27 DIAGNOSIS — Z510 Encounter for antineoplastic radiation therapy: Secondary | ICD-10-CM | POA: Diagnosis not present

## 2018-03-27 DIAGNOSIS — C50412 Malignant neoplasm of upper-outer quadrant of left female breast: Secondary | ICD-10-CM | POA: Diagnosis not present

## 2018-03-28 ENCOUNTER — Other Ambulatory Visit: Payer: Self-pay

## 2018-03-28 ENCOUNTER — Ambulatory Visit
Admission: RE | Admit: 2018-03-28 | Discharge: 2018-03-28 | Disposition: A | Discharge status: Home / Self Care | Payer: Medicare Other | Source: Ambulatory Visit | Attending: Radiation Oncology | Admitting: Radiation Oncology

## 2018-03-28 DIAGNOSIS — Z510 Encounter for antineoplastic radiation therapy: Secondary | ICD-10-CM | POA: Diagnosis not present

## 2018-03-28 DIAGNOSIS — C50412 Malignant neoplasm of upper-outer quadrant of left female breast: Secondary | ICD-10-CM | POA: Diagnosis not present

## 2018-03-31 ENCOUNTER — Inpatient Hospital Stay: Payer: Medicare Other | Attending: Radiation Oncology

## 2018-03-31 ENCOUNTER — Ambulatory Visit
Admission: RE | Admit: 2018-03-31 | Discharge: 2018-03-31 | Disposition: A | Discharge status: Home / Self Care | Payer: Medicare Other | Source: Ambulatory Visit | Attending: Radiation Oncology | Admitting: Radiation Oncology

## 2018-03-31 ENCOUNTER — Other Ambulatory Visit: Payer: Self-pay

## 2018-03-31 DIAGNOSIS — Z170 Estrogen receptor positive status [ER+]: Secondary | ICD-10-CM | POA: Diagnosis not present

## 2018-03-31 DIAGNOSIS — Z510 Encounter for antineoplastic radiation therapy: Secondary | ICD-10-CM | POA: Diagnosis not present

## 2018-03-31 DIAGNOSIS — Z7901 Long term (current) use of anticoagulants: Secondary | ICD-10-CM | POA: Insufficient documentation

## 2018-03-31 DIAGNOSIS — C50912 Malignant neoplasm of unspecified site of left female breast: Secondary | ICD-10-CM

## 2018-03-31 DIAGNOSIS — Z95828 Presence of other vascular implants and grafts: Secondary | ICD-10-CM

## 2018-03-31 DIAGNOSIS — I2699 Other pulmonary embolism without acute cor pulmonale: Secondary | ICD-10-CM | POA: Diagnosis not present

## 2018-03-31 DIAGNOSIS — C50412 Malignant neoplasm of upper-outer quadrant of left female breast: Secondary | ICD-10-CM | POA: Diagnosis not present

## 2018-03-31 LAB — CBC
HCT: 35.9 % — ABNORMAL LOW (ref 36.0–46.0)
Hemoglobin: 11.9 g/dL — ABNORMAL LOW (ref 12.0–15.0)
MCH: 29.3 pg (ref 26.0–34.0)
MCHC: 33.1 g/dL (ref 30.0–36.0)
MCV: 88.4 fL (ref 80.0–100.0)
Platelets: 194 10*3/uL (ref 150–400)
RBC: 4.06 MIL/uL (ref 3.87–5.11)
RDW: 14.8 % (ref 11.5–15.5)
WBC: 6.2 10*3/uL (ref 4.0–10.5)
nRBC: 0 % (ref 0.0–0.2)

## 2018-03-31 MED ORDER — SODIUM CHLORIDE 0.9% FLUSH
10.0000 mL | Freq: Once | INTRAVENOUS | Status: AC
  Administered 2018-03-31: 10 mL via INTRAVENOUS
  Filled 2018-03-31: qty 10

## 2018-03-31 MED ORDER — HEPARIN SOD (PORK) LOCK FLUSH 100 UNIT/ML IV SOLN
500.0000 [IU] | Freq: Once | INTRAVENOUS | Status: AC
  Administered 2018-03-31: 500 [IU] via INTRAVENOUS

## 2018-04-01 ENCOUNTER — Other Ambulatory Visit: Payer: Self-pay

## 2018-04-01 ENCOUNTER — Ambulatory Visit
Admission: RE | Admit: 2018-04-01 | Discharge: 2018-04-01 | Disposition: A | Discharge status: Home / Self Care | Payer: Medicare Other | Source: Ambulatory Visit | Attending: Radiation Oncology | Admitting: Radiation Oncology

## 2018-04-01 ENCOUNTER — Ambulatory Visit (INDEPENDENT_AMBULATORY_CARE_PROVIDER_SITE_OTHER): Payer: Medicare Other | Admitting: General Surgery

## 2018-04-01 ENCOUNTER — Encounter: Payer: Self-pay | Admitting: General Surgery

## 2018-04-01 VITALS — BP 135/78 | HR 72 | Temp 97.8°F | Ht 68.0 in | Wt 207.0 lb

## 2018-04-01 DIAGNOSIS — Z510 Encounter for antineoplastic radiation therapy: Secondary | ICD-10-CM | POA: Diagnosis not present

## 2018-04-01 DIAGNOSIS — C50412 Malignant neoplasm of upper-outer quadrant of left female breast: Secondary | ICD-10-CM | POA: Diagnosis not present

## 2018-04-01 DIAGNOSIS — C50912 Malignant neoplasm of unspecified site of left female breast: Secondary | ICD-10-CM | POA: Diagnosis not present

## 2018-04-01 NOTE — Patient Instructions (Signed)
Return in June for right mammogram The patient is aware to call back for any questions or concerns.

## 2018-04-01 NOTE — Progress Notes (Signed)
Patient ID: Alicia [name], female   DOB: 02-15-1950, 68 y.o.   MRN: [id number]  Chief Complaint  Patient presents with  . Breast Cancer    HPI Alicia [name] is a 68 y.o. female here today for her follow up breast cancer.  HPI  Past Medical History:  Diagnosis Date  . Anxiety   . Cancer ([place]) 07/2017   left breast  . Depression   . Hypertension   . Hypothyroidism   . Rapid heart rate   . Thyroid disease     Past Surgical History:  Procedure Laterality Date  . AXILLARY LYMPH NODE BIOPSY Left 07/16/2017   METASTATIC MAMMARY CARCINOMA  . BREAST BIOPSY Left 07/16/2017   [place] bx of left breast mass and left breast LN.  INVASIVE MAMMARY CARCINOMA, NO SPECIAL TYPE.   Marland Kitchen BREAST EXCISIONAL BIOPSY Right 2001  . BREAST LUMPECTOMY Right    2001  . BREAST LUMPECTOMY WITH SENTINEL LYMPH NODE BIOPSY Left 12/06/2017   Procedure: BREAST LUMPECTOMY WITH SENTINEL LYMPH NODE BX;  Surgeon: Robert Bellow, MD;  Location: [facility] ORS;  Service: General;  Laterality: Left;  . COLONOSCOPY    . PORTACATH PLACEMENT Right 08/07/2017   Procedure: INSERTION PORT-A-CATH;  Surgeon: Robert Bellow, MD;  Location: [facility] ORS;  Service: General;  Laterality: Right;  . SIMPLE MASTECTOMY WITH AXILLARY SENTINEL NODE BIOPSY Left 12/23/2017   Procedure: SIMPLE MASTECTOMY;  Surgeon: Robert Bellow, MD;  Location: [facility] OR[facility];  Service: General;  Laterality: Left;  . TONSILLECTOMY      Family History  Problem Relation Age of Onset  . Stroke Mother   . Thyroid disease Mother   . Renal Disease Mother   . Stroke Father   . Heart attack Father   . Sudden death Father 5       suicide  . Anuerysm Brother   . Breast cancer Neg Hx     Social History Social History   Tobacco Use  . Smoking status: Current Some Day Smoker    Packs/day: 0.25    Years: 30.00    Pack years: 7.50    Types: Cigarettes  . Smokeless tobacco: Never Used  Substance Use Topics  . Alcohol use: Yes    Comment: occasional q  71mo   . Drug use: Never    Allergies  Allergen Reactions  . Sulfa Antibiotics Diarrhea    Current Outpatient Medications  Medication Sig Dispense Refill  . acetaminophen (TYLENOL) 500 MG tablet Take 1,000 mg by mouth every 6 (six) hours as needed for moderate pain or headache.     . albuterol (PROVENTIL HFA;VENTOLIN HFA) 108 (90 Base) MCG/ACT inhaler Inhale 2 puffs into the lungs every 6 (six) hours as needed for wheezing or shortness of breath. 1 Inhaler 2  . ALPRAZolam (XANAX) 0.25 MG tablet Take 0.25 mg by mouth 2 (two) times daily.     Marland Kitchen amLODipine (NORVASC) 5 MG tablet Take 5 mg by mouth at bedtime.     Marland Kitchen apixaban (ELIQUIS) 5 MG TABS tablet Take 5 mg by mouth 2 (two) times daily.    Marland Kitchen aspirin EC 81 MG tablet Take 81 mg by mouth at bedtime.     Marland Kitchen buPROPion (WELLBUTRIN SR) 150 MG 12 hr tablet Take 150 mg by mouth 2 (two) times daily.    Marland Kitchen HYDROcodone-acetaminophen (NORCO/VICODIN) 5-325 MG tablet Take 1 tablet by mouth every 4 (four) hours as needed for moderate pain. 20 tablet 0  . letrozole (FEMARA) 2.5 MG  tablet Take 1 tablet (2.5 mg total) by mouth daily. 30 tablet 3  . levothyroxine (SYNTHROID, LEVOTHROID) 100 MCG tablet Take 100 mcg by mouth daily before breakfast.    . lidocaine-prilocaine (EMLA) cream Apply to affected area once 30 g 3  . lisinopril (PRINIVIL,ZESTRIL) 10 MG tablet Take 10 mg by mouth daily.    Marland Kitchen loratadine (CLARITIN) 10 MG tablet Take 10 mg by mouth daily as needed for allergies.    . Lysine 500 MG CAPS Take 500 mg by mouth daily as needed (for fever blister).     . naproxen sodium (ALEVE) 220 MG tablet Take 220-440 mg by mouth 2 (two) times daily as needed (swelling/ pain).    . Potassium 99 MG TABS Take 99 mg by mouth at bedtime.     . traZODone (DESYREL) 50 MG tablet Take 50 mg by mouth at bedtime.     No current facility-administered medications for this visit.     Review of Systems Review of Systems  Blood pressure 135/78, pulse 72, temperature 97.8  F (36.6 C), temperature source Skin, height 5\' 8"  (1.727 m), weight 207 lb (93.9 kg), SpO2 99 %.  Physical Exam Physical Exam Exam conducted with a chaperone present.  Constitutional:      Appearance: She is well-developed.  Eyes:     General: No scleral icterus.    Conjunctiva/sclera: Conjunctivae normal.  Neck:     Musculoskeletal: Neck supple.  Cardiovascular:     Rate and Rhythm: Normal rate and regular rhythm.     Heart sounds: Normal heart sounds.  Pulmonary:     Effort: Pulmonary effort is normal.     Breath sounds: Normal breath sounds.  Chest:     Breasts:        Right: No swelling, bleeding, inverted nipple, mass, nipple discharge, skin change or tenderness.     Lymphadenopathy:     Cervical: No cervical adenopathy.  Skin:    General: Skin is warm and dry.  Neurological:     Mental Status: She is alert and oriented to person, place, and time.     Data Reviewed Radiation oncology notes.  Assessment Doing well status post mastectomy.  Plans for whole breast radiation.  Plan  Return in June for right mammogram The patient is aware to call back for any questions or concerns.   HPI, Physical Exam, Assessment and Plan have been scribed under the direction and in the presence of Hervey Ard, MD.  Gaspar Cola, CMA  I have completed the exam and reviewed the above documentation for accuracy and completeness.  I agree with the above.  Haematologist has been used and any errors in dictation or transcription are unintentional.  Hervey Ard, M.D., F.A.C.S.  Alicia [name] 04/02/2018, 2:42 PM

## 2018-04-02 ENCOUNTER — Encounter: Payer: Self-pay | Admitting: General Surgery

## 2018-04-02 ENCOUNTER — Ambulatory Visit
Admission: RE | Admit: 2018-04-02 | Discharge: 2018-04-02 | Disposition: A | Discharge status: Home / Self Care | Payer: Medicare Other | Source: Ambulatory Visit | Attending: Radiation Oncology | Admitting: Radiation Oncology

## 2018-04-02 DIAGNOSIS — Z510 Encounter for antineoplastic radiation therapy: Secondary | ICD-10-CM | POA: Diagnosis not present

## 2018-04-02 DIAGNOSIS — C50412 Malignant neoplasm of upper-outer quadrant of left female breast: Secondary | ICD-10-CM | POA: Diagnosis not present

## 2018-04-03 ENCOUNTER — Ambulatory Visit
Admission: RE | Admit: 2018-04-03 | Discharge: 2018-04-03 | Disposition: A | Discharge status: Home / Self Care | Payer: Medicare Other | Source: Ambulatory Visit | Attending: Radiation Oncology | Admitting: Radiation Oncology

## 2018-04-03 ENCOUNTER — Other Ambulatory Visit: Payer: Self-pay

## 2018-04-03 DIAGNOSIS — C50412 Malignant neoplasm of upper-outer quadrant of left female breast: Secondary | ICD-10-CM | POA: Diagnosis not present

## 2018-04-03 DIAGNOSIS — Z510 Encounter for antineoplastic radiation therapy: Secondary | ICD-10-CM | POA: Diagnosis not present

## 2018-04-04 ENCOUNTER — Other Ambulatory Visit: Payer: Self-pay

## 2018-04-04 ENCOUNTER — Ambulatory Visit
Admission: RE | Admit: 2018-04-04 | Discharge: 2018-04-04 | Disposition: A | Discharge status: Home / Self Care | Payer: Medicare Other | Source: Ambulatory Visit | Attending: Radiation Oncology | Admitting: Radiation Oncology

## 2018-04-04 DIAGNOSIS — C50412 Malignant neoplasm of upper-outer quadrant of left female breast: Secondary | ICD-10-CM | POA: Diagnosis not present

## 2018-04-04 DIAGNOSIS — Z510 Encounter for antineoplastic radiation therapy: Secondary | ICD-10-CM | POA: Diagnosis not present

## 2018-04-07 ENCOUNTER — Inpatient Hospital Stay: Payer: Medicare Other

## 2018-04-07 ENCOUNTER — Other Ambulatory Visit: Payer: Self-pay

## 2018-04-07 ENCOUNTER — Ambulatory Visit
Admission: RE | Admit: 2018-04-07 | Discharge: 2018-04-07 | Disposition: A | Discharge status: Home / Self Care | Payer: Medicare Other | Source: Ambulatory Visit | Attending: Radiation Oncology | Admitting: Radiation Oncology

## 2018-04-07 DIAGNOSIS — Z510 Encounter for antineoplastic radiation therapy: Secondary | ICD-10-CM | POA: Diagnosis not present

## 2018-04-07 DIAGNOSIS — C50412 Malignant neoplasm of upper-outer quadrant of left female breast: Secondary | ICD-10-CM | POA: Diagnosis not present

## 2018-04-08 ENCOUNTER — Other Ambulatory Visit: Payer: Self-pay

## 2018-04-08 ENCOUNTER — Ambulatory Visit
Admission: RE | Admit: 2018-04-08 | Discharge: 2018-04-08 | Disposition: A | Discharge status: Home / Self Care | Payer: Medicare Other | Source: Ambulatory Visit | Attending: Radiation Oncology | Admitting: Radiation Oncology

## 2018-04-08 DIAGNOSIS — Z510 Encounter for antineoplastic radiation therapy: Secondary | ICD-10-CM | POA: Diagnosis not present

## 2018-04-08 DIAGNOSIS — C50412 Malignant neoplasm of upper-outer quadrant of left female breast: Secondary | ICD-10-CM | POA: Diagnosis not present

## 2018-04-09 ENCOUNTER — Ambulatory Visit
Admission: RE | Admit: 2018-04-09 | Discharge: 2018-04-09 | Disposition: A | Discharge status: Home / Self Care | Payer: Medicare Other | Source: Ambulatory Visit | Attending: Radiation Oncology | Admitting: Radiation Oncology

## 2018-04-09 ENCOUNTER — Other Ambulatory Visit: Payer: Self-pay

## 2018-04-09 DIAGNOSIS — Z510 Encounter for antineoplastic radiation therapy: Secondary | ICD-10-CM | POA: Diagnosis not present

## 2018-04-09 DIAGNOSIS — C50412 Malignant neoplasm of upper-outer quadrant of left female breast: Secondary | ICD-10-CM | POA: Diagnosis not present

## 2018-04-10 ENCOUNTER — Ambulatory Visit
Admission: RE | Admit: 2018-04-10 | Discharge: 2018-04-10 | Disposition: A | Discharge status: Home / Self Care | Payer: Medicare Other | Source: Ambulatory Visit | Attending: Radiation Oncology | Admitting: Radiation Oncology

## 2018-04-10 ENCOUNTER — Other Ambulatory Visit: Payer: Self-pay

## 2018-04-10 DIAGNOSIS — Z510 Encounter for antineoplastic radiation therapy: Secondary | ICD-10-CM | POA: Diagnosis not present

## 2018-04-10 DIAGNOSIS — C50412 Malignant neoplasm of upper-outer quadrant of left female breast: Secondary | ICD-10-CM | POA: Diagnosis not present

## 2018-04-11 ENCOUNTER — Ambulatory Visit
Admission: RE | Admit: 2018-04-11 | Discharge: 2018-04-11 | Disposition: A | Discharge status: Home / Self Care | Payer: Medicare Other | Source: Ambulatory Visit | Attending: Radiation Oncology | Admitting: Radiation Oncology

## 2018-04-11 ENCOUNTER — Ambulatory Visit: Admission: RE | Admit: 2018-04-11 | Payer: Medicare Other | Source: Ambulatory Visit

## 2018-04-11 ENCOUNTER — Other Ambulatory Visit: Payer: Self-pay

## 2018-04-11 DIAGNOSIS — C50412 Malignant neoplasm of upper-outer quadrant of left female breast: Secondary | ICD-10-CM | POA: Diagnosis not present

## 2018-04-11 DIAGNOSIS — Z510 Encounter for antineoplastic radiation therapy: Secondary | ICD-10-CM | POA: Diagnosis not present

## 2018-04-14 ENCOUNTER — Other Ambulatory Visit: Payer: Self-pay

## 2018-04-14 ENCOUNTER — Inpatient Hospital Stay: Payer: Medicare Other

## 2018-04-14 ENCOUNTER — Ambulatory Visit
Admission: RE | Admit: 2018-04-14 | Discharge: 2018-04-14 | Disposition: A | Discharge status: Home / Self Care | Payer: Medicare Other | Source: Ambulatory Visit | Attending: Radiation Oncology | Admitting: Radiation Oncology

## 2018-04-14 DIAGNOSIS — Z7901 Long term (current) use of anticoagulants: Secondary | ICD-10-CM | POA: Diagnosis not present

## 2018-04-14 DIAGNOSIS — C50912 Malignant neoplasm of unspecified site of left female breast: Secondary | ICD-10-CM

## 2018-04-14 DIAGNOSIS — Z510 Encounter for antineoplastic radiation therapy: Secondary | ICD-10-CM | POA: Diagnosis not present

## 2018-04-14 DIAGNOSIS — I2699 Other pulmonary embolism without acute cor pulmonale: Secondary | ICD-10-CM | POA: Diagnosis not present

## 2018-04-14 DIAGNOSIS — C50412 Malignant neoplasm of upper-outer quadrant of left female breast: Secondary | ICD-10-CM | POA: Diagnosis not present

## 2018-04-14 DIAGNOSIS — Z170 Estrogen receptor positive status [ER+]: Secondary | ICD-10-CM | POA: Diagnosis not present

## 2018-04-14 LAB — CBC
HCT: 37 % (ref 36.0–46.0)
Hemoglobin: 12.4 g/dL (ref 12.0–15.0)
MCH: 29.7 pg (ref 26.0–34.0)
MCHC: 33.5 g/dL (ref 30.0–36.0)
MCV: 88.7 fL (ref 80.0–100.0)
Platelets: 201 10*3/uL (ref 150–400)
RBC: 4.17 MIL/uL (ref 3.87–5.11)
RDW: 14.1 % (ref 11.5–15.5)
WBC: 5.2 10*3/uL (ref 4.0–10.5)
nRBC: 0 % (ref 0.0–0.2)

## 2018-04-15 ENCOUNTER — Ambulatory Visit
Admission: RE | Admit: 2018-04-15 | Discharge: 2018-04-15 | Disposition: A | Discharge status: Home / Self Care | Payer: Medicare Other | Source: Ambulatory Visit | Attending: Radiation Oncology | Admitting: Radiation Oncology

## 2018-04-15 ENCOUNTER — Other Ambulatory Visit: Payer: Self-pay

## 2018-04-15 DIAGNOSIS — Z510 Encounter for antineoplastic radiation therapy: Secondary | ICD-10-CM | POA: Diagnosis not present

## 2018-04-15 DIAGNOSIS — C50412 Malignant neoplasm of upper-outer quadrant of left female breast: Secondary | ICD-10-CM | POA: Diagnosis not present

## 2018-04-16 ENCOUNTER — Other Ambulatory Visit: Payer: Self-pay

## 2018-04-16 ENCOUNTER — Ambulatory Visit
Admission: RE | Admit: 2018-04-16 | Discharge: 2018-04-16 | Disposition: A | Discharge status: Home / Self Care | Payer: Medicare Other | Source: Ambulatory Visit | Attending: Radiation Oncology | Admitting: Radiation Oncology

## 2018-04-16 DIAGNOSIS — C50412 Malignant neoplasm of upper-outer quadrant of left female breast: Secondary | ICD-10-CM | POA: Insufficient documentation

## 2018-04-16 DIAGNOSIS — Z510 Encounter for antineoplastic radiation therapy: Secondary | ICD-10-CM | POA: Diagnosis not present

## 2018-04-17 ENCOUNTER — Ambulatory Visit
Admission: RE | Admit: 2018-04-17 | Discharge: 2018-04-17 | Disposition: A | Discharge status: Home / Self Care | Payer: Medicare Other | Source: Ambulatory Visit | Attending: Radiation Oncology | Admitting: Radiation Oncology

## 2018-04-17 ENCOUNTER — Other Ambulatory Visit: Payer: Self-pay

## 2018-04-17 DIAGNOSIS — C50412 Malignant neoplasm of upper-outer quadrant of left female breast: Secondary | ICD-10-CM | POA: Diagnosis not present

## 2018-04-17 DIAGNOSIS — Z510 Encounter for antineoplastic radiation therapy: Secondary | ICD-10-CM | POA: Diagnosis not present

## 2018-04-18 ENCOUNTER — Other Ambulatory Visit: Payer: Self-pay

## 2018-04-18 ENCOUNTER — Ambulatory Visit
Admission: RE | Admit: 2018-04-18 | Discharge: 2018-04-18 | Disposition: A | Discharge status: Home / Self Care | Payer: Medicare Other | Source: Ambulatory Visit | Attending: Radiation Oncology | Admitting: Radiation Oncology

## 2018-04-18 DIAGNOSIS — Z510 Encounter for antineoplastic radiation therapy: Secondary | ICD-10-CM | POA: Diagnosis not present

## 2018-04-18 DIAGNOSIS — C50412 Malignant neoplasm of upper-outer quadrant of left female breast: Secondary | ICD-10-CM | POA: Diagnosis not present

## 2018-04-21 ENCOUNTER — Ambulatory Visit
Admission: RE | Admit: 2018-04-21 | Discharge: 2018-04-21 | Disposition: A | Discharge status: Home / Self Care | Payer: Medicare Other | Source: Ambulatory Visit | Attending: Radiation Oncology | Admitting: Radiation Oncology

## 2018-04-21 ENCOUNTER — Other Ambulatory Visit: Payer: Self-pay

## 2018-04-21 DIAGNOSIS — Z510 Encounter for antineoplastic radiation therapy: Secondary | ICD-10-CM | POA: Diagnosis not present

## 2018-04-21 DIAGNOSIS — C50412 Malignant neoplasm of upper-outer quadrant of left female breast: Secondary | ICD-10-CM | POA: Diagnosis not present

## 2018-04-22 ENCOUNTER — Other Ambulatory Visit: Payer: Self-pay

## 2018-04-22 ENCOUNTER — Ambulatory Visit
Admission: RE | Admit: 2018-04-22 | Discharge: 2018-04-22 | Disposition: A | Discharge status: Home / Self Care | Payer: Medicare Other | Source: Ambulatory Visit | Attending: Radiation Oncology | Admitting: Radiation Oncology

## 2018-04-22 DIAGNOSIS — Z510 Encounter for antineoplastic radiation therapy: Secondary | ICD-10-CM | POA: Diagnosis not present

## 2018-04-22 DIAGNOSIS — C50412 Malignant neoplasm of upper-outer quadrant of left female breast: Secondary | ICD-10-CM | POA: Diagnosis not present

## 2018-04-23 ENCOUNTER — Other Ambulatory Visit: Payer: Self-pay

## 2018-04-23 ENCOUNTER — Ambulatory Visit
Admission: RE | Admit: 2018-04-23 | Discharge: 2018-04-23 | Disposition: A | Discharge status: Home / Self Care | Payer: Medicare Other | Source: Ambulatory Visit | Attending: Radiation Oncology | Admitting: Radiation Oncology

## 2018-04-23 DIAGNOSIS — Z510 Encounter for antineoplastic radiation therapy: Secondary | ICD-10-CM | POA: Diagnosis not present

## 2018-04-23 DIAGNOSIS — C50412 Malignant neoplasm of upper-outer quadrant of left female breast: Secondary | ICD-10-CM | POA: Diagnosis not present

## 2018-04-24 ENCOUNTER — Other Ambulatory Visit: Payer: Self-pay

## 2018-04-24 ENCOUNTER — Ambulatory Visit
Admission: RE | Admit: 2018-04-24 | Discharge: 2018-04-24 | Disposition: A | Discharge status: Home / Self Care | Payer: Medicare Other | Source: Ambulatory Visit | Attending: Radiation Oncology | Admitting: Radiation Oncology

## 2018-04-24 DIAGNOSIS — Z510 Encounter for antineoplastic radiation therapy: Secondary | ICD-10-CM | POA: Diagnosis not present

## 2018-04-24 DIAGNOSIS — C50412 Malignant neoplasm of upper-outer quadrant of left female breast: Secondary | ICD-10-CM | POA: Diagnosis not present

## 2018-04-25 ENCOUNTER — Other Ambulatory Visit: Payer: Self-pay

## 2018-04-25 ENCOUNTER — Ambulatory Visit
Admission: RE | Admit: 2018-04-25 | Discharge: 2018-04-25 | Disposition: A | Discharge status: Home / Self Care | Payer: Medicare Other | Source: Ambulatory Visit | Attending: Radiation Oncology | Admitting: Radiation Oncology

## 2018-04-25 DIAGNOSIS — C50412 Malignant neoplasm of upper-outer quadrant of left female breast: Secondary | ICD-10-CM | POA: Diagnosis not present

## 2018-04-25 DIAGNOSIS — Z510 Encounter for antineoplastic radiation therapy: Secondary | ICD-10-CM | POA: Diagnosis not present

## 2018-04-28 ENCOUNTER — Ambulatory Visit
Admission: RE | Admit: 2018-04-28 | Discharge: 2018-04-28 | Disposition: A | Discharge status: Home / Self Care | Payer: Medicare Other | Source: Ambulatory Visit | Attending: Radiation Oncology | Admitting: Radiation Oncology

## 2018-04-28 ENCOUNTER — Inpatient Hospital Stay: Payer: Medicare Other | Attending: Oncology

## 2018-04-28 ENCOUNTER — Other Ambulatory Visit: Payer: Self-pay

## 2018-04-28 DIAGNOSIS — F410 Panic disorder [episodic paroxysmal anxiety] without agoraphobia: Secondary | ICD-10-CM | POA: Diagnosis not present

## 2018-04-28 DIAGNOSIS — C50412 Malignant neoplasm of upper-outer quadrant of left female breast: Secondary | ICD-10-CM | POA: Diagnosis not present

## 2018-04-28 DIAGNOSIS — R011 Cardiac murmur, unspecified: Secondary | ICD-10-CM | POA: Diagnosis not present

## 2018-04-28 DIAGNOSIS — Z510 Encounter for antineoplastic radiation therapy: Secondary | ICD-10-CM | POA: Diagnosis not present

## 2018-04-28 DIAGNOSIS — I4891 Unspecified atrial fibrillation: Secondary | ICD-10-CM | POA: Diagnosis not present

## 2018-04-28 DIAGNOSIS — C50912 Malignant neoplasm of unspecified site of left female breast: Secondary | ICD-10-CM

## 2018-04-28 DIAGNOSIS — Z95828 Presence of other vascular implants and grafts: Secondary | ICD-10-CM

## 2018-04-28 DIAGNOSIS — C50919 Malignant neoplasm of unspecified site of unspecified female breast: Secondary | ICD-10-CM | POA: Diagnosis not present

## 2018-04-28 LAB — CBC
HCT: 37.4 % (ref 36.0–46.0)
Hemoglobin: 12.6 g/dL (ref 12.0–15.0)
MCH: 29.6 pg (ref 26.0–34.0)
MCHC: 33.7 g/dL (ref 30.0–36.0)
MCV: 88 fL (ref 80.0–100.0)
Platelets: 192 10*3/uL (ref 150–400)
RBC: 4.25 MIL/uL (ref 3.87–5.11)
RDW: 13.8 % (ref 11.5–15.5)
WBC: 6.4 10*3/uL (ref 4.0–10.5)
nRBC: 0 % (ref 0.0–0.2)

## 2018-04-28 MED ORDER — HEPARIN SOD (PORK) LOCK FLUSH 100 UNIT/ML IV SOLN
500.0000 [IU] | Freq: Once | INTRAVENOUS | Status: AC
  Administered 2018-04-28: 14:00:00 500 [IU] via INTRAVENOUS
  Filled 2018-04-28: qty 5

## 2018-04-28 MED ORDER — SODIUM CHLORIDE 0.9% FLUSH
10.0000 mL | INTRAVENOUS | Status: DC | PRN
  Administered 2018-04-28: 14:00:00 10 mL via INTRAVENOUS
  Filled 2018-04-28: qty 10

## 2018-04-29 ENCOUNTER — Ambulatory Visit
Admission: RE | Admit: 2018-04-29 | Discharge: 2018-04-29 | Disposition: A | Discharge status: Home / Self Care | Payer: Medicare Other | Source: Ambulatory Visit | Attending: Radiation Oncology | Admitting: Radiation Oncology

## 2018-04-29 ENCOUNTER — Other Ambulatory Visit: Payer: Self-pay

## 2018-04-29 DIAGNOSIS — C50412 Malignant neoplasm of upper-outer quadrant of left female breast: Secondary | ICD-10-CM | POA: Diagnosis not present

## 2018-04-29 DIAGNOSIS — Z510 Encounter for antineoplastic radiation therapy: Secondary | ICD-10-CM | POA: Diagnosis not present

## 2018-04-30 ENCOUNTER — Ambulatory Visit
Admission: RE | Admit: 2018-04-30 | Discharge: 2018-04-30 | Disposition: A | Discharge status: Home / Self Care | Payer: Medicare Other | Source: Ambulatory Visit | Attending: Radiation Oncology | Admitting: Radiation Oncology

## 2018-04-30 ENCOUNTER — Other Ambulatory Visit: Payer: Self-pay

## 2018-04-30 DIAGNOSIS — Z510 Encounter for antineoplastic radiation therapy: Secondary | ICD-10-CM | POA: Diagnosis not present

## 2018-04-30 DIAGNOSIS — C50412 Malignant neoplasm of upper-outer quadrant of left female breast: Secondary | ICD-10-CM | POA: Diagnosis not present

## 2018-05-01 ENCOUNTER — Ambulatory Visit
Admission: RE | Admit: 2018-05-01 | Discharge: 2018-05-01 | Disposition: A | Discharge status: Home / Self Care | Payer: Medicare Other | Source: Ambulatory Visit | Attending: Radiation Oncology | Admitting: Radiation Oncology

## 2018-05-01 ENCOUNTER — Other Ambulatory Visit: Payer: Self-pay

## 2018-05-01 DIAGNOSIS — Z510 Encounter for antineoplastic radiation therapy: Secondary | ICD-10-CM | POA: Diagnosis not present

## 2018-05-01 DIAGNOSIS — C50412 Malignant neoplasm of upper-outer quadrant of left female breast: Secondary | ICD-10-CM | POA: Diagnosis not present

## 2018-05-02 ENCOUNTER — Other Ambulatory Visit: Payer: Self-pay

## 2018-05-02 ENCOUNTER — Ambulatory Visit
Admission: RE | Admit: 2018-05-02 | Discharge: 2018-05-02 | Disposition: A | Discharge status: Home / Self Care | Payer: Medicare Other | Source: Ambulatory Visit | Attending: Radiation Oncology | Admitting: Radiation Oncology

## 2018-05-02 DIAGNOSIS — Z510 Encounter for antineoplastic radiation therapy: Secondary | ICD-10-CM | POA: Diagnosis not present

## 2018-05-02 DIAGNOSIS — C50412 Malignant neoplasm of upper-outer quadrant of left female breast: Secondary | ICD-10-CM | POA: Diagnosis not present

## 2018-05-05 ENCOUNTER — Other Ambulatory Visit: Payer: Self-pay

## 2018-05-05 ENCOUNTER — Ambulatory Visit
Admission: RE | Admit: 2018-05-05 | Discharge: 2018-05-05 | Disposition: A | Discharge status: Home / Self Care | Payer: Medicare Other | Source: Ambulatory Visit | Attending: Radiation Oncology | Admitting: Radiation Oncology

## 2018-05-05 DIAGNOSIS — C50412 Malignant neoplasm of upper-outer quadrant of left female breast: Secondary | ICD-10-CM | POA: Diagnosis not present

## 2018-05-05 DIAGNOSIS — Z510 Encounter for antineoplastic radiation therapy: Secondary | ICD-10-CM | POA: Diagnosis not present

## 2018-05-13 DIAGNOSIS — I4891 Unspecified atrial fibrillation: Secondary | ICD-10-CM | POA: Diagnosis not present

## 2018-05-13 DIAGNOSIS — C50919 Malignant neoplasm of unspecified site of unspecified female breast: Secondary | ICD-10-CM | POA: Diagnosis not present

## 2018-05-13 DIAGNOSIS — F410 Panic disorder [episodic paroxysmal anxiety] without agoraphobia: Secondary | ICD-10-CM | POA: Diagnosis not present

## 2018-05-13 DIAGNOSIS — R011 Cardiac murmur, unspecified: Secondary | ICD-10-CM | POA: Diagnosis not present

## 2018-05-29 ENCOUNTER — Telehealth: Payer: Self-pay | Admitting: Oncology

## 2018-05-29 NOTE — Progress Notes (Signed)
[place]  [phone]) [phone] [phone]) [phone]  ID: RONELLE MICHIE OB: 28-Feb-1950  MR#: [id number]  XMI#:[id number]  Patient Care Team: [name], MD as PCP - General (Internal Medicine) Bary Castilla Forest Gleason, MD as Consulting Physician (General Surgery)  I connected with [name] on 05/30/18 at 11:00 AM EDT by video enabled telemedicine visit and verified that I am speaking with the correct person using two identifiers.   I discussed the limitations, risks, security and privacy concerns of performing an evaluation and management service by telemedicine and the availability of in-person appointments. I also discussed with the patient that there may be a patient responsible charge related to this service. The patient expressed understanding and agreed to proceed.   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  INTERVAL HISTORY: Patient agreed to video and able telemedicine visit for routine evaluation.  She completed her adjuvant XRT several weeks ago and tolerated it well only with some moderate skin damage and burning.  She initiated her letrozole soon thereafter and is tolerating it well without significant side effects.  She currently feels well and is asymptomatic. She does not complain of peripheral neuropathy today.  She has no neurologic complaints.  She denies any recent fevers or illnesses. She has a good appetite and denies weight loss.  She denies any chest pain, shortness of breath, cough, or hemoptysis.  She denies any nausea, vomiting, constipation, or diarrhea.  She has no urinary complaints.  Patient offers no further specific complaints today.  REVIEW OF SYSTEMS:   Review of Systems  Constitutional: Negative.   Negative for fever, malaise/fatigue and weight loss.  Respiratory: Negative.  Negative for cough and shortness of breath.   Cardiovascular: Negative.  Negative for chest pain and leg swelling.  Gastrointestinal: Negative.  Negative for abdominal pain and constipation.  Genitourinary: Negative.  Negative for dysuria and urgency.  Musculoskeletal: Negative.  Negative for back pain.  Skin: Negative.  Negative for rash.  Neurological: Negative.  Negative for dizziness, tingling, sensory change, focal weakness, weakness and headaches.  Psychiatric/Behavioral: Negative.  The patient is not nervous/anxious.     As per HPI. Otherwise, a complete review of systems is negative.  PAST MEDICAL HISTORY: Past Medical History:  Diagnosis Date  . Anxiety   . Cancer ([place]) 07/2017   left breast  . Depression   . Hypertension   . Hypothyroidism   . Rapid heart rate   . Thyroid disease     PAST SURGICAL HISTORY: Past Surgical History:  Procedure Laterality Date  . AXILLARY LYMPH NODE BIOPSY Left 07/16/2017   METASTATIC MAMMARY CARCINOMA  . BREAST BIOPSY Left 07/16/2017   [place] bx of left breast mass and left breast LN.  INVASIVE MAMMARY CARCINOMA, NO SPECIAL TYPE.   Marland Kitchen BREAST EXCISIONAL BIOPSY Right 2001  . BREAST LUMPECTOMY Right    2001  . BREAST LUMPECTOMY WITH SENTINEL LYMPH NODE BIOPSY Left 12/06/2017   Procedure: BREAST LUMPECTOMY WITH SENTINEL LYMPH NODE BX;  Surgeon: Robert Bellow, MD;  Location: [facility] ORS;  Service: General;  Laterality: Left;  . COLONOSCOPY    . PORTACATH PLACEMENT Right 08/07/2017   Procedure: INSERTION PORT-A-CATH;  Surgeon: Robert Bellow, MD;  Location: [facility] OR[facility];  Service: General;  Laterality: Right;  . SIMPLE MASTECTOMY WITH  AXILLARY SENTINEL NODE BIOPSY Left 12/23/2017   T2,N2 with 6/7 nodes positive. Whole breast radiation.  Surgeon: Robert Bellow, MD;  Location: [facility] ORS;  Service: General;  Laterality: Left;  . TONSILLECTOMY      FAMILY HISTORY:  Family History  Problem Relation Age of Onset  . Stroke Mother   . Thyroid disease Mother   . Renal Disease Mother   . Stroke Father   . Heart attack Father   . Sudden death Father 62       suicide  . Anuerysm Brother   . Breast cancer Neg Hx     ADVANCED DIRECTIVES (Y/N):  N  HEALTH MAINTENANCE: Social History   Tobacco Use  . Smoking status: Current Some Day Smoker    Packs/day: 0.25    Years: 30.00    Pack years: 7.50    Types: Cigarettes  . Smokeless tobacco: Never Used  Substance Use Topics  . Alcohol use: Yes    Comment: occasional q 72mo  . Drug use: Never     Colonoscopy:  PAP:  Bone density:  Lipid panel:  Allergies  Allergen Reactions  . Sulfa Antibiotics Diarrhea    Current Outpatient Medications  Medication Sig Dispense Refill  . acetaminophen (TYLENOL) 500 MG tablet Take 1,000 mg by mouth every 6 (six) hours as needed for moderate pain or headache.     . albuterol (PROVENTIL HFA;VENTOLIN HFA) 108 (90 Base) MCG/ACT inhaler Inhale 2 puffs into the lungs every 6 (six) hours as needed for wheezing or shortness of breath. 1 Inhaler 2  . ALPRAZolam (XANAX) 0.25 MG tablet Take 0.25 mg by mouth 2 (two) times daily.     .Marland KitchenamLODipine (NORVASC) 5 MG tablet Take 5 mg by mouth at bedtime.     .Marland Kitchenaspirin EC 81 MG tablet Take 81 mg by mouth at bedtime.     .Marland KitchenbuPROPion (WELLBUTRIN SR) 150 MG 12 hr tablet Take 150 mg by mouth 2 (two) times daily.    . calcium carbonate (OS-CAL - DOSED IN MG OF ELEMENTAL CALCIUM) 1250 (500 Ca) MG tablet Take 1 tablet by mouth daily with breakfast.    . Cholecalciferol (VITAMIN D3) 125 MCG (5000 UT) CAPS Take 1 capsule by mouth 2 (two) times a day.    . letrozole (FEMARA) 2.5 MG tablet Take 1 tablet (2.5 mg total) by mouth daily. 30 tablet 3  . levothyroxine (SYNTHROID, LEVOTHROID) 100 MCG tablet Take 100 mcg by mouth daily before breakfast.    . lidocaine-prilocaine (EMLA) cream Apply to affected area once 30 g 3  . lisinopril  (PRINIVIL,ZESTRIL) 10 MG tablet Take 10 mg by mouth daily.    . traZODone (DESYREL) 50 MG tablet Take 50 mg by mouth at bedtime.    .Marland Kitchenwarfarin (COUMADIN) 5 MG tablet Take 5 mg by mouth daily.     No current facility-administered medications for this visit.     OBJECTIVE: There were no vitals filed for this visit.   There is no height or weight on file to calculate BMI.    ECOG FS:0 - Asymptomatic  General: Well-developed, well-nourished, no acute distress. HEENT: Normocephalic. Neuro: Alert, answering all questions appropriately. Cranial nerves grossly intact. Skin: No rashes or petechiae noted. Psych: Normal affect.  LAB RESULTS:  Lab Results  Component Value Date   NA 138 01/12/2018   K 4.2 01/12/2018   CL 107 01/12/2018   CO2 24 01/12/2018   GLUCOSE 93 01/12/2018   BUN 22 01/12/2018  CREATININE 0.88 01/12/2018   CALCIUM 8.9 01/12/2018   PROT 7.2 01/10/2018   ALBUMIN 4.4 01/10/2018   AST 26 01/10/2018   ALT 24 01/10/2018   ALKPHOS 148 (H) 01/10/2018   BILITOT 0.5 01/10/2018   GFRNONAA >60 01/12/2018   GFRAA >60 01/12/2018    Lab Results  Component Value Date   WBC 6.4 04/28/2018   NEUTROABS 4.2 11/14/2017   HGB 12.6 04/28/2018   HCT 37.4 04/28/2018   MCV 88.0 04/28/2018   PLT 192 04/28/2018     STUDIES: No results found.  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  PLAN:    1. 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  Case was also discussed extensively at case conference.  Patient received neoadjuvant chemotherapy with Adriamycin and Cytoxan.  She only received 4 cycles of weekly Taxol prior to discontinuation of treatment on October 31, 2017 secondary to persistent peripheral neuropathy.  She ultimately required mastectomy and final pathology noted 5 of 6  lymph nodes positive for disease.  Patient completed adjuvant XRT in mid April 2020.  Continue letrozole for total 5 years completing in May 2025.  No further intervention is needed.  Return to clinic in 3 months for routine evaluation.  2.  Anemia: Resolved.  3.  Peripheral neuropathy: Patient does not complain of this today. 4.  Pulmonary embolus: Patient was diagnosed with a small pulmonary embolus on January 10, 2018.  She has now taken greater than 3 months of anticoagulation and has been instructed to discontinue treatment. 5.  Osteopenia: Bone mineral density from February 26, 2018 reported T score of -1.6.  Continue calcium and vitamin D supplementation.  Repeat in February 2021.  I provided 25 minutes of face-to-face video visit time during this encounter, and > 50% was spent counseling as documented under my assessment & plan.  Patient expressed understanding and was in agreement with this plan. She also understands that She can call clinic at any time with any questions, concerns, or complaints.   Cancer Staging Breast cancer, stage 2, left ([place]) Staging form: Breast, AJCC 8th Edition - Clinical stage from 07/30/2017: Stage IIA (cT2, cN1, cM0, G2, ER+, PR+, HER2-) - Signed by Lloyd Huger, MD on 07/30/2017   Lloyd Huger, MD   05/30/2018 10:04 AM

## 2018-05-30 ENCOUNTER — Other Ambulatory Visit: Payer: Self-pay

## 2018-05-30 ENCOUNTER — Inpatient Hospital Stay: Payer: Medicare Other | Attending: Oncology | Admitting: Oncology

## 2018-05-30 ENCOUNTER — Encounter: Payer: Self-pay | Admitting: Oncology

## 2018-05-30 DIAGNOSIS — Z452 Encounter for adjustment and management of vascular access device: Secondary | ICD-10-CM | POA: Insufficient documentation

## 2018-05-30 DIAGNOSIS — C50912 Malignant neoplasm of unspecified site of left female breast: Secondary | ICD-10-CM

## 2018-05-30 DIAGNOSIS — C50412 Malignant neoplasm of upper-outer quadrant of left female breast: Secondary | ICD-10-CM | POA: Insufficient documentation

## 2018-05-30 DIAGNOSIS — Z170 Estrogen receptor positive status [ER+]: Secondary | ICD-10-CM | POA: Insufficient documentation

## 2018-05-30 NOTE — Progress Notes (Signed)
Patient stated that she was doing well with no complaints. Patient stated that Dr. Lavera Guise had told her that he wanted her hematologist/oncologist to care for her blood clot therapy. Patient is currently taking Warfarin instead of Eliquis due to her insurance.

## 2018-06-12 ENCOUNTER — Other Ambulatory Visit: Payer: Self-pay

## 2018-06-12 DIAGNOSIS — C50919 Malignant neoplasm of unspecified site of unspecified female breast: Secondary | ICD-10-CM | POA: Diagnosis not present

## 2018-06-12 DIAGNOSIS — I4891 Unspecified atrial fibrillation: Secondary | ICD-10-CM | POA: Diagnosis not present

## 2018-06-12 DIAGNOSIS — F410 Panic disorder [episodic paroxysmal anxiety] without agoraphobia: Secondary | ICD-10-CM | POA: Diagnosis not present

## 2018-06-12 DIAGNOSIS — R011 Cardiac murmur, unspecified: Secondary | ICD-10-CM | POA: Diagnosis not present

## 2018-06-13 ENCOUNTER — Encounter: Payer: Self-pay | Admitting: Radiation Oncology

## 2018-06-13 ENCOUNTER — Inpatient Hospital Stay: Payer: Medicare Other

## 2018-06-13 ENCOUNTER — Ambulatory Visit
Admission: RE | Admit: 2018-06-13 | Discharge: 2018-06-13 | Disposition: A | Discharge status: Home / Self Care | Payer: Medicare Other | Source: Ambulatory Visit | Attending: Radiation Oncology | Admitting: Radiation Oncology

## 2018-06-13 ENCOUNTER — Other Ambulatory Visit: Payer: Self-pay

## 2018-06-13 VITALS — BP 121/76 | HR 69 | Temp 96.9°F | Resp 18 | Wt 208.0 lb

## 2018-06-13 DIAGNOSIS — Z170 Estrogen receptor positive status [ER+]: Secondary | ICD-10-CM | POA: Insufficient documentation

## 2018-06-13 DIAGNOSIS — Z923 Personal history of irradiation: Secondary | ICD-10-CM | POA: Insufficient documentation

## 2018-06-13 DIAGNOSIS — Z9012 Acquired absence of left breast and nipple: Secondary | ICD-10-CM | POA: Diagnosis not present

## 2018-06-13 DIAGNOSIS — C50412 Malignant neoplasm of upper-outer quadrant of left female breast: Secondary | ICD-10-CM | POA: Insufficient documentation

## 2018-06-13 DIAGNOSIS — Z79811 Long term (current) use of aromatase inhibitors: Secondary | ICD-10-CM | POA: Insufficient documentation

## 2018-06-13 DIAGNOSIS — Z452 Encounter for adjustment and management of vascular access device: Secondary | ICD-10-CM | POA: Diagnosis not present

## 2018-06-13 DIAGNOSIS — Z95828 Presence of other vascular implants and grafts: Secondary | ICD-10-CM

## 2018-06-13 DIAGNOSIS — Z9221 Personal history of antineoplastic chemotherapy: Secondary | ICD-10-CM | POA: Diagnosis not present

## 2018-06-13 DIAGNOSIS — C50912 Malignant neoplasm of unspecified site of left female breast: Secondary | ICD-10-CM

## 2018-06-13 MED ORDER — SODIUM CHLORIDE 0.9% FLUSH
10.0000 mL | Freq: Once | INTRAVENOUS | Status: AC
  Administered 2018-06-13: 10 mL via INTRAVENOUS
  Filled 2018-06-13: qty 10

## 2018-06-13 MED ORDER — HEPARIN SOD (PORK) LOCK FLUSH 100 UNIT/ML IV SOLN
500.0000 [IU] | Freq: Once | INTRAVENOUS | Status: AC
  Administered 2018-06-13: 500 [IU] via INTRAVENOUS

## 2018-06-13 NOTE — Progress Notes (Signed)
Radiation Oncology Follow up Note  Name: Alicia [name]   Date:   06/13/2018 MRN:  [id number] DOB: April 18, 1950    This 68 y.o. female presents to the clinic today for 1 month follow-up status post left chest wall and peripheral emphatic radiation for stage IIb ER PR positive HER-2 negative invasive mammary carcinoma status post neoadjuvant chemotherapy and left modified radical mastectomy.  REFERRING PROVIDER: [name], MD  HPI: Patient is a 68 year old female now at 1 month having completed left chest wall and peripheral emphatic radiation for stage IIb (T2 N1 M0) ER PR positive HER-2 negative invasive mammary carcinoma of the upper outer quadrant left breast status post neoadjuvant chemotherapy and then modified radical mastectomy.  She is seen today in routine follow-up and doing well specifically denies any chest wall pain or tenderness or any new masses or nodularity.  She is specifically denies any swelling or lymphedema in her left upper extremity..  She is currently on letrozole tolerating that well without side effect.  COMPLICATIONS OF TREATMENT: none  FOLLOW UP COMPLIANCE: keeps appointments   PHYSICAL EXAM:  BP 121/76   Pulse 69   Temp (!) 96.9 F (36.1 C)   Resp 18   Wt 208 lb 0.1 oz (94.3 kg)   BMI 31.63 kg/m  Patient is status post left modified radical mastectomy chest wall is clear.  Right breast is free of dominant mass or nodularity in 2 positions examined.  No axillary or supraclavicular adenopathy is appreciated.  Well-developed well-nourished patient in NAD. HEENT reveals PERLA, EOMI, discs not visualized.  Oral cavity is clear. No oral mucosal lesions are identified. Neck is clear without evidence of cervical or supraclavicular adenopathy. Lungs are clear to A&P. Cardiac examination is essentially unremarkable with regular rate and rhythm without murmur rub or thrill. Abdomen is benign with no organomegaly or masses noted. Motor sensory and DTR levels are equal  and symmetric in the upper and lower extremities. Cranial nerves II through XII are grossly intact. Proprioception is intact. No peripheral adenopathy or edema is identified. No motor or sensory levels are noted. Crude visual fields are within normal range.  RADIOLOGY RESULTS: No current films for review  PLAN: Present time she is doing well 1 month out from chest wall peripheral emphatic radiation.  I am pleased with her overall progress.  She continues on letrozole without side effect.  I have asked to see her back in 4 to 5 months for follow-up.  She continues close follow-up care with medical oncology.  Patient knows to call with any concerns at any time.  I would like to take this opportunity to thank you for allowing me to participate in the care of your patient.Noreene Filbert, MD

## 2018-06-20 ENCOUNTER — Other Ambulatory Visit: Payer: Self-pay | Admitting: Internal Medicine

## 2018-06-20 ENCOUNTER — Other Ambulatory Visit: Payer: Self-pay

## 2018-06-20 ENCOUNTER — Ambulatory Visit
Admission: RE | Admit: 2018-06-20 | Discharge: 2018-06-20 | Disposition: A | Discharge status: Home / Self Care | Payer: Medicare Other | Source: Ambulatory Visit | Attending: Internal Medicine | Admitting: Internal Medicine

## 2018-06-20 DIAGNOSIS — S8002XA Contusion of left knee, initial encounter: Secondary | ICD-10-CM

## 2018-06-20 DIAGNOSIS — R5381 Other malaise: Secondary | ICD-10-CM | POA: Diagnosis not present

## 2018-06-20 DIAGNOSIS — C50919 Malignant neoplasm of unspecified site of unspecified female breast: Secondary | ICD-10-CM | POA: Diagnosis not present

## 2018-06-20 DIAGNOSIS — F410 Panic disorder [episodic paroxysmal anxiety] without agoraphobia: Secondary | ICD-10-CM | POA: Diagnosis not present

## 2018-06-20 DIAGNOSIS — I4891 Unspecified atrial fibrillation: Secondary | ICD-10-CM | POA: Diagnosis not present

## 2018-06-20 DIAGNOSIS — R011 Cardiac murmur, unspecified: Secondary | ICD-10-CM | POA: Diagnosis not present

## 2018-06-20 NOTE — Progress Notes (Signed)
Pt needs correct side of xray re-ordered. Dr Lavera Guise ordered the R side.

## 2018-06-27 DIAGNOSIS — C50919 Malignant neoplasm of unspecified site of unspecified female breast: Secondary | ICD-10-CM | POA: Diagnosis not present

## 2018-06-27 DIAGNOSIS — R011 Cardiac murmur, unspecified: Secondary | ICD-10-CM | POA: Diagnosis not present

## 2018-06-27 DIAGNOSIS — I4891 Unspecified atrial fibrillation: Secondary | ICD-10-CM | POA: Diagnosis not present

## 2018-06-27 DIAGNOSIS — F410 Panic disorder [episodic paroxysmal anxiety] without agoraphobia: Secondary | ICD-10-CM | POA: Diagnosis not present

## 2018-07-14 ENCOUNTER — Other Ambulatory Visit: Payer: Self-pay | Admitting: *Deleted

## 2018-07-14 DIAGNOSIS — Z1231 Encounter for screening mammogram for malignant neoplasm of breast: Secondary | ICD-10-CM

## 2018-07-16 ENCOUNTER — Telehealth: Payer: Self-pay | Admitting: *Deleted

## 2018-07-16 ENCOUNTER — Encounter: Payer: Self-pay | Admitting: *Deleted

## 2018-07-16 DIAGNOSIS — C50919 Malignant neoplasm of unspecified site of unspecified female breast: Secondary | ICD-10-CM

## 2018-07-16 NOTE — Telephone Encounter (Signed)
Patient's mammogram scheduled and office visit scheduled and letter mailed to patient.

## 2018-07-16 NOTE — Telephone Encounter (Signed)
Patient in June recalls.   Note routed to C[place]Lyn to make sure she is on the list to schedule June mammogram.

## 2018-07-16 NOTE — Research (Signed)
T/C made to patient Alicia [name] at Dr. Gary Fleet request to determine whether patient is interested in participating in the [employer] [phone] BWEL clinical trial. Patient was receptive to learning more about the study and she was given a brief overview of the purpose of the study and the two study arms: intervention vs control arm. She expressed interest in reading more about the study and said that she has been trying unsuccessfully to lose weight since she completed her breast cancer treatment. Discussed emailing information vs sending hard copy in the mail. Alicia [name] preferred that a copy of the study consent be mailed to her. Plans made for me to call her back in one week to determine whether she is interested in proceeding with enrollment in the study. Copy if ICF along with my contact information mailed out to patient today. She also has my phone number in case she has questions in the meantime. Yolande Jolly, BSN, MHA, OCN 07/16/2018 12:21 PM  Received t/c back from [facility] stating she has read the consent form for the BWEL study but has not yet made up her mind about participating. She has questions about the tissue for bio-banking and what that entails and also asks whether there is a monetary incentive to assist with buying fresh fruits and vegetables for the diet she will be on. Informed about the bio-banking process and that there is no monetary incentive for this study. Also informed that I will check with the financial navigator to determine whether there are available funds to assist with groceries for Research participants. Patient states she will talk to her surgeon and oncologist before she decides whether to participate, and will call me back at that time. Patient thanked for considering the study. Yolande Jolly, BSN, MHA, OCN 07/24/2018 4:21 PM

## 2018-07-25 ENCOUNTER — Other Ambulatory Visit: Payer: Self-pay

## 2018-07-25 ENCOUNTER — Inpatient Hospital Stay: Payer: Medicare Other | Attending: Oncology

## 2018-07-25 DIAGNOSIS — Z452 Encounter for adjustment and management of vascular access device: Secondary | ICD-10-CM | POA: Diagnosis not present

## 2018-07-25 DIAGNOSIS — Z95828 Presence of other vascular implants and grafts: Secondary | ICD-10-CM

## 2018-07-25 DIAGNOSIS — C50412 Malignant neoplasm of upper-outer quadrant of left female breast: Secondary | ICD-10-CM | POA: Diagnosis not present

## 2018-07-25 MED ORDER — HEPARIN SOD (PORK) LOCK FLUSH 100 UNIT/ML IV SOLN
500.0000 [IU] | Freq: Once | INTRAVENOUS | Status: AC
  Administered 2018-07-25: 500 [IU] via INTRAVENOUS

## 2018-07-25 MED ORDER — SODIUM CHLORIDE 0.9% FLUSH
10.0000 mL | Freq: Once | INTRAVENOUS | Status: AC
  Administered 2018-07-25: 11:00:00 10 mL via INTRAVENOUS
  Filled 2018-07-25: qty 10

## 2018-07-31 DIAGNOSIS — I4891 Unspecified atrial fibrillation: Secondary | ICD-10-CM | POA: Diagnosis not present

## 2018-07-31 DIAGNOSIS — F410 Panic disorder [episodic paroxysmal anxiety] without agoraphobia: Secondary | ICD-10-CM | POA: Diagnosis not present

## 2018-07-31 DIAGNOSIS — R011 Cardiac murmur, unspecified: Secondary | ICD-10-CM | POA: Diagnosis not present

## 2018-07-31 DIAGNOSIS — C50919 Malignant neoplasm of unspecified site of unspecified female breast: Secondary | ICD-10-CM | POA: Diagnosis not present

## 2018-08-08 ENCOUNTER — Telehealth: Payer: Self-pay | Admitting: General Practice

## 2018-08-08 NOTE — Telephone Encounter (Signed)
Patient called to r/s her august appt I offered her Dr. Dahlia Byes or Dr. Hampton Abbot, patient stated she would look them up and see which one she would like to be setup with. Please schedule patient when she calls back.

## 2018-08-21 ENCOUNTER — Ambulatory Visit
Admission: RE | Admit: 2018-08-21 | Discharge: 2018-08-21 | Disposition: A | Discharge status: Home / Self Care | Payer: Medicare Other | Source: Ambulatory Visit | Attending: General Surgery | Admitting: General Surgery

## 2018-08-21 DIAGNOSIS — Z1231 Encounter for screening mammogram for malignant neoplasm of breast: Secondary | ICD-10-CM | POA: Insufficient documentation

## 2018-08-28 ENCOUNTER — Ambulatory Visit: Payer: Medicare Other | Admitting: General Surgery

## 2018-08-29 DIAGNOSIS — E039 Hypothyroidism, unspecified: Secondary | ICD-10-CM | POA: Diagnosis not present

## 2018-08-29 DIAGNOSIS — K219 Gastro-esophageal reflux disease without esophagitis: Secondary | ICD-10-CM | POA: Diagnosis not present

## 2018-08-29 DIAGNOSIS — I10 Essential (primary) hypertension: Secondary | ICD-10-CM | POA: Diagnosis not present

## 2018-08-29 DIAGNOSIS — K5909 Other constipation: Secondary | ICD-10-CM | POA: Diagnosis not present

## 2018-08-29 NOTE — Progress Notes (Signed)
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  INTERVAL HISTORY: Patient returns to clinic today for routine 70-monthevaluation.  She continues to have multiple medical complaints that are chronic in nature.  More specifically she complains of hot flashes, weight gain, and joint pain which she attributes to her letrozole.  She also has a persistent peripheral neuropathy, but admits this is improving.  She has no other neurologic complaints.  She denies any recent fevers or illnesses.  She denies any chest pain, shortness of breath, cough, or hemoptysis.  She denies any nausea, vomiting, constipation, or diarrhea.  She has no urinary complaints.  Patient offers no further specific complaints today.  REVIEW OF SYSTEMS:   Review of Systems  Constitutional: Negative.  Negative for fever, malaise/fatigue and weight loss.  Respiratory: Negative.  Negative for cough and shortness of breath.   Cardiovascular: Negative.  Negative for chest pain and leg swelling.  Gastrointestinal: Negative.  Negative for abdominal pain and constipation.  Genitourinary: Negative.  Negative for dysuria and urgency.  Musculoskeletal: Positive for joint pain. Negative for back pain.  Skin: Negative.  Negative for rash.  Neurological: Positive for tingling and sensory change. Negative for dizziness, focal weakness, weakness and headaches.  Psychiatric/Behavioral: Negative.  The patient is not nervous/anxious.     As per HPI. Otherwise, a complete review of systems is negative.  PAST MEDICAL HISTORY: Past Medical History:  Diagnosis Date  .  Anxiety   . Cancer ([place] 07/2017   left breast  . Depression   . Hypertension   . Hypothyroidism   . Personal history of chemotherapy 2019   LEFT mastectomy-chemo before  . Personal history of radiation therapy 03/2018   LEFT mastectomy  . Rapid heart rate   . Thyroid disease     PAST SURGICAL HISTORY: Past Surgical History:  Procedure Laterality Date  . AXILLARY LYMPH NODE BIOPSY Left 07/16/2017   METASTATIC MAMMARY CARCINOMA  . BREAST BIOPSY Left 07/16/2017   [place] of left breast mass and left breast LN.  INVASIVE MAMMARY CARCINOMA, NO SPECIAL TYPE.   .Marland KitchenBREAST EXCISIONAL BIOPSY Right 2001   benign  . BREAST LUMPECTOMY WITH SENTINEL LYMPH NODE BIOPSY Left 12/06/2017   Procedure: BREAST LUMPECTOMY WITH SENTINEL LYMPH NODE BX;  Surgeon: BRobert Bellow MD;  Location: [facility] ORS;  Service: General;  Laterality: Left;  . COLONOSCOPY    . MASTECTOMY Left 12/23/2017  . PORTACATH PLACEMENT Right 08/07/2017   Procedure: INSERTION PORT-A-CATH;  Surgeon: BRobert Bellow MD;  Location: [facility] ORS;  Service: General;  Laterality: Right;  . SIMPLE MASTECTOMY WITH AXILLARY SENTINEL NODE BIOPSY Left 12/23/2017   T2,N2 with 6/7 nodes positive. Whole breast radiation.  Surgeon: BRobert Bellow MD;  Location: [facility] ORS;  Service: General;  Laterality: Left;  . TONSILLECTOMY      FAMILY HISTORY: Family History  Problem Relation Age of Onset  . Stroke Mother   . Thyroid disease Mother   . Renal Disease Mother   . Stroke Father   . Heart attack Father   . Sudden death Father 578      suicide  . Anuerysm Brother   .  Breast cancer Neg Hx     ADVANCED DIRECTIVES (Y/N):  N  HEALTH MAINTENANCE: Social History   Tobacco Use  . Smoking status: Current Some Day Smoker    Packs/day: 0.25    Years: 30.00    Pack years: 7.50    Types: Cigarettes  . Smokeless tobacco: Never Used  Substance Use Topics  . Alcohol use: Yes    Comment: occasional q 62mo  . Drug use: Never      Colonoscopy:  PAP:  Bone density:  Lipid panel:  Allergies  Allergen Reactions  . Sulfa Antibiotics Diarrhea    Current Outpatient Medications  Medication Sig Dispense Refill  . acetaminophen (TYLENOL) 500 MG tablet Take 1,000 mg by mouth every 6 (six) hours as needed for moderate pain or headache.     . albuterol (PROVENTIL HFA;VENTOLIN HFA) 108 (90 Base) MCG/ACT inhaler Inhale 2 puffs into the lungs every 6 (six) hours as needed for wheezing or shortness of breath. 1 Inhaler 2  . ALPRAZolam (XANAX) 0.25 MG tablet Take 0.25 mg by mouth 2 (two) times daily.     .Marland KitchenamLODipine (NORVASC) 5 MG tablet Take 5 mg by mouth at bedtime.     .Marland Kitchenaspirin EC 81 MG tablet Take 81 mg by mouth at bedtime.     .Marland KitchenbuPROPion (WELLBUTRIN SR) 150 MG 12 hr tablet Take 150 mg by mouth 2 (two) times daily.    . calcium-vitamin D (OSCAL WITH D) 250-125 MG-UNIT tablet Take 1 tablet by mouth 2 (two) times daily.    .Marland Kitchenletrozole (FEMARA) 2.5 MG tablet Take 1 tablet (2.5 mg total) by mouth daily. 30 tablet 3  . levothyroxine (SYNTHROID, LEVOTHROID) 100 MCG tablet Take 100 mcg by mouth daily before breakfast.    . lidocaine-prilocaine (EMLA) cream Apply to affected area once 30 g 3  . lisinopril (PRINIVIL,ZESTRIL) 10 MG tablet Take 10 mg by mouth daily.    . Magnesium Carbonate Heavy POWD by Does not apply route. Natural Vitality Calm magnesium supplement    . traZODone (DESYREL) 50 MG tablet Take 50 mg by mouth at bedtime.    . calcium carbonate (OS-CAL - DOSED IN MG OF ELEMENTAL CALCIUM) 1250 (500 Ca) MG tablet Take 1 tablet by mouth daily with breakfast.    . Cholecalciferol (VITAMIN D3) 125 MCG (5000 UT) CAPS Take 1 capsule by mouth 2 (two) times a day.    . warfarin (COUMADIN) 5 MG tablet Take 5 mg by mouth daily.     No current facility-administered medications for this visit.     OBJECTIVE: Vitals:   09/05/18 1106  BP: 121/76  Pulse: 77  Resp: 18  Temp: (!) 96.7 F (35.9 C)     Body mass index is 33.24  kg/m.    ECOG FS:0 - Asymptomatic  General: Well-developed, well-nourished, no acute distress. Eyes: Pink conjunctiva, anicteric sclera. HEENT: Normocephalic, moist mucous membranes. Breast: Patient requested exam be deferred today. Lungs: Clear to auscultation bilaterally. Heart: Regular rate and rhythm. No rubs, murmurs, or gallops. Abdomen: Soft, nontender, nondistended. No organomegaly noted, normoactive bowel sounds. Musculoskeletal: No edema, cyanosis, or clubbing. Neuro: Alert, answering all questions appropriately. Cranial nerves grossly intact. Skin: No rashes or petechiae noted. Psych: Normal affect.  LAB RESULTS:  Lab Results  Component Value Date   NA 138 01/12/2018   K 4.2 01/12/2018   CL 107 01/12/2018   CO2 24 01/12/2018   GLUCOSE 93 01/12/2018   BUN 22 01/12/2018   CREATININE  0.88 01/12/2018   CALCIUM 8.9 01/12/2018   PROT 7.2 01/10/2018   ALBUMIN 4.4 01/10/2018   AST 26 01/10/2018   ALT 24 01/10/2018   ALKPHOS 148 (H) 01/10/2018   BILITOT 0.5 01/10/2018   GFRNONAA >60 01/12/2018   GFRAA >60 01/12/2018    Lab Results  Component Value Date   WBC 6.4 04/28/2018   NEUTROABS 4.2 11/14/2017   HGB 12.6 04/28/2018   HCT 37.4 04/28/2018   MCV 88.0 04/28/2018   PLT 192 04/28/2018     STUDIES: Mm 3d Screen Breast Uni Right  Result Date: 08/21/2018 CLINICAL DATA:  Screening. Prior LEFT mastectomy in December, 2019. EXAM: DIGITAL SCREENING UNILATERAL RIGHT MAMMOGRAM WITH CAD AND TOMO COMPARISON:  Previous exam(s). ACR Breast Density Category b: There are scattered areas of fibroglandular density. FINDINGS: The patient has had a left mastectomy. There are no findings suspicious for malignancy in the RIGHT breast. The Port-A-Cath port overlies the UPPER INNER QUADRANT at POSTERIOR depth. Images were processed with CAD. IMPRESSION: No mammographic evidence of malignancy. A result letter of this screening mammogram will be mailed directly to the patient.  RECOMMENDATION: Screening mammogram in one year.  (Code:SM-R-34M) BI-RADS CATEGORY  1: Negative. Electronically Signed   By: Evangeline Dakin M.D.   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  PLAN:    1. 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  Case was also discussed extensively at case conference.  Patient received neoadjuvant chemotherapy with Adriamycin and Cytoxan.  She only received 4 cycles of weekly Taxol prior to discontinuation of treatment on October 31, 2017 secondary to persistent peripheral neuropathy.  She ultimately required mastectomy and final pathology noted 5 of 6 lymph nodes positive for disease.  Patient completed adjuvant XRT in mid April 2020.  Pain initiated letrozole in May 2020.  She will likely require extended therapy given her high risk disease.  She has been instructed to hold treatment for 1 month to see if her symptoms resolve.  Patient will then follow-up with video assisted telemedicine visit for further evaluation and discussion of treatment options.  2.  Anemia: Resolved.  3.  Peripheral neuropathy: Improving, monitor. 4.  Pulmonary embolus: Patient was diagnosed with a small pulmonary embolus on January 10, 2018.  She has now taken greater than 3 months of anticoagulation and has been instructed to discontinue treatment. 5.  Osteopenia: Bone mineral density from February 26, 2018 reported T score of -1.6.  Continue calcium and vitamin D supplementation.  Repeat in February 2021.  I spent a total of 30 minutes face-to-face with the patient of which greater than 50% of the visit was spent in counseling and coordination of care as detailed above.   Patient expressed understanding and was in agreement with this plan. She also understands  that She can call clinic at any time with any questions, concerns, or complaints.   Cancer Staging Breast cancer, stage 2, left ([place]) Staging form: Breast, AJCC 8th Edition - Clinical stage from 07/30/2017: Stage IIA (cT2, cN1, cM0, G2, ER+, PR+, HER2-) - Signed by Lloyd Huger, MD on 07/30/2017   Lloyd Huger, MD   09/05/2018 3:56 PM

## 2018-09-04 ENCOUNTER — Other Ambulatory Visit: Payer: Self-pay

## 2018-09-05 ENCOUNTER — Other Ambulatory Visit: Payer: Self-pay

## 2018-09-05 ENCOUNTER — Inpatient Hospital Stay (HOSPITAL_BASED_OUTPATIENT_CLINIC_OR_DEPARTMENT_OTHER): Payer: Medicare Other | Admitting: Oncology

## 2018-09-05 ENCOUNTER — Inpatient Hospital Stay: Payer: Medicare Other | Attending: Oncology

## 2018-09-05 ENCOUNTER — Encounter: Payer: Self-pay | Admitting: Oncology

## 2018-09-05 VITALS — BP 121/76 | HR 77 | Temp 96.7°F | Resp 18 | Wt 218.6 lb

## 2018-09-05 DIAGNOSIS — Z79899 Other long term (current) drug therapy: Secondary | ICD-10-CM | POA: Insufficient documentation

## 2018-09-05 DIAGNOSIS — Z452 Encounter for adjustment and management of vascular access device: Secondary | ICD-10-CM | POA: Diagnosis not present

## 2018-09-05 DIAGNOSIS — E039 Hypothyroidism, unspecified: Secondary | ICD-10-CM | POA: Insufficient documentation

## 2018-09-05 DIAGNOSIS — Z170 Estrogen receptor positive status [ER+]: Secondary | ICD-10-CM | POA: Diagnosis not present

## 2018-09-05 DIAGNOSIS — F419 Anxiety disorder, unspecified: Secondary | ICD-10-CM | POA: Insufficient documentation

## 2018-09-05 DIAGNOSIS — G629 Polyneuropathy, unspecified: Secondary | ICD-10-CM | POA: Insufficient documentation

## 2018-09-05 DIAGNOSIS — Z9012 Acquired absence of left breast and nipple: Secondary | ICD-10-CM | POA: Diagnosis not present

## 2018-09-05 DIAGNOSIS — I10 Essential (primary) hypertension: Secondary | ICD-10-CM | POA: Diagnosis not present

## 2018-09-05 DIAGNOSIS — C50912 Malignant neoplasm of unspecified site of left female breast: Secondary | ICD-10-CM | POA: Diagnosis not present

## 2018-09-05 DIAGNOSIS — C50412 Malignant neoplasm of upper-outer quadrant of left female breast: Secondary | ICD-10-CM | POA: Diagnosis not present

## 2018-09-05 DIAGNOSIS — Z86711 Personal history of pulmonary embolism: Secondary | ICD-10-CM | POA: Diagnosis not present

## 2018-09-05 DIAGNOSIS — M8580 Other specified disorders of bone density and structure, unspecified site: Secondary | ICD-10-CM | POA: Insufficient documentation

## 2018-09-05 DIAGNOSIS — Z95828 Presence of other vascular implants and grafts: Secondary | ICD-10-CM

## 2018-09-05 DIAGNOSIS — F17210 Nicotine dependence, cigarettes, uncomplicated: Secondary | ICD-10-CM | POA: Diagnosis not present

## 2018-09-05 DIAGNOSIS — Z7901 Long term (current) use of anticoagulants: Secondary | ICD-10-CM | POA: Insufficient documentation

## 2018-09-05 DIAGNOSIS — Z7982 Long term (current) use of aspirin: Secondary | ICD-10-CM | POA: Insufficient documentation

## 2018-09-05 MED ORDER — HEPARIN SOD (PORK) LOCK FLUSH 100 UNIT/ML IV SOLN
500.0000 [IU] | Freq: Once | INTRAVENOUS | Status: AC
  Administered 2018-09-05: 500 [IU] via INTRAVENOUS
  Filled 2018-09-05: qty 5

## 2018-09-05 MED ORDER — SODIUM CHLORIDE 0.9% FLUSH
10.0000 mL | Freq: Once | INTRAVENOUS | Status: AC
  Administered 2018-09-05: 10 mL via INTRAVENOUS
  Filled 2018-09-05: qty 10

## 2018-09-05 NOTE — Research (Signed)
I met with patient today regarding the research study:  Breast Cancer Weight Loss Study (BWEL Study); Randomized Phase III Trial Evaluating the Role of Weight Loss in Adjuvant Treatment of Overweight and Obese Women with Early Breast Cancer sponsored through [facility].  Patient had previously spoken with research nurse, Yolande Jolly on two occasions and had been mailed a copy of the consent to review.  Patient states she reviewed the consent and is interested in losing weight but hopes that she will get randomized to Group 2 which provides a weight loss coach, not just weight loss education  She also had questions about the obtaining of her tissue from [place] and wanted to make sure that her tissue would be available locally if needed for her treatment in the future. She also said that she had recently hurt her left knee and wanted to make sure there were no physical requirement aspects of the study. I reviewed the consent form with her including risks/benefits, voluntary participation, the right to withdraw from the study at any time,  randomization, her requirements to be on the study including blood sample and quality of life questionnaires. We also reviewed optional laboratory studies (biobanking, contact if bio banked sample matches to future research and surgery sample submission) and she was agreeable to optional procedures.  Patient signed 1) consent form IRB approved 06/26/2017, 2) [place] to Use or [place] for Research form IRB approved 09/13/2014, 3) [place] for Use/Disclosure of [place] form and 4) [facility] Consent for Release of Medical Information/Specimens form.  Copies of all signed documents given to patient.  Patient was also given business cards of Nutritional therapist.  Research nurse will follow up with patient next week after she is  randomized to inform her of the randomization assignment and schedule her next research appointment.  Lula Olszewski Clinical oncology research associate 09/05/2018 1520

## 2018-09-05 NOTE — Progress Notes (Signed)
Patient had mammogram on 08/21/2018.  She is concerned about her weight gain, hot flashes, and occasional nausea.

## 2018-09-05 NOTE — Research (Signed)
I met with patient today and consented her to research study DCP-001, Use of Clinical Trial Screening Tool to Address Cancer Health Disparities in the [place] program [facility]).  Study was reviewed with patient with the goal of the study being to understand why patients consent or don't consent to [place] sponsored research studies.  Patient consented today to [place] sponsored research study [phone] BWEL that is studying weight loss in breast cancer patients.  Consent form reviewed including purpose of study, voluntary enrollment and risks and benefits. Patient signed consent form IRB approved 06/27/2017 and Authorization to Use or [place] for Research from for the [place] of all signed documents given to patient.  Patient was thanked for her participation. Lula Olszewski Clinical oncology research associate 09/05/2018 [phone]

## 2018-09-11 ENCOUNTER — Other Ambulatory Visit: Payer: Self-pay

## 2018-09-11 ENCOUNTER — Inpatient Hospital Stay: Payer: Medicare Other

## 2018-09-12 ENCOUNTER — Other Ambulatory Visit: Payer: Self-pay

## 2018-09-15 ENCOUNTER — Inpatient Hospital Stay: Payer: Medicare Other

## 2018-09-15 ENCOUNTER — Other Ambulatory Visit: Payer: Self-pay

## 2018-09-15 DIAGNOSIS — F17210 Nicotine dependence, cigarettes, uncomplicated: Secondary | ICD-10-CM | POA: Diagnosis not present

## 2018-09-15 DIAGNOSIS — C50912 Malignant neoplasm of unspecified site of left female breast: Secondary | ICD-10-CM

## 2018-09-15 DIAGNOSIS — Z170 Estrogen receptor positive status [ER+]: Secondary | ICD-10-CM | POA: Diagnosis not present

## 2018-09-15 DIAGNOSIS — Z9012 Acquired absence of left breast and nipple: Secondary | ICD-10-CM | POA: Diagnosis not present

## 2018-09-15 DIAGNOSIS — C50919 Malignant neoplasm of unspecified site of unspecified female breast: Secondary | ICD-10-CM

## 2018-09-15 DIAGNOSIS — Z452 Encounter for adjustment and management of vascular access device: Secondary | ICD-10-CM | POA: Diagnosis not present

## 2018-09-15 DIAGNOSIS — C50412 Malignant neoplasm of upper-outer quadrant of left female breast: Secondary | ICD-10-CM | POA: Diagnosis not present

## 2018-09-15 DIAGNOSIS — E039 Hypothyroidism, unspecified: Secondary | ICD-10-CM | POA: Diagnosis not present

## 2018-09-15 LAB — GLUCOSE, RANDOM: Glucose, Bld: 107 mg/dL — ABNORMAL HIGH (ref 70–99)

## 2018-09-15 NOTE — Research (Signed)
Patient in to the cancer center today to have fasting glucose drawn for the BWEL [id number] Protocol. She was randomized to the protocol on August 27,2020 to treatment Arm 1 with preventive weight loss education for two years and optional blood and tissue submission for genomix. Fasting glucose did not get drawn with her office visit on the 27th with Dr. Grayland Ormond. The labs in the progress notes were not from that day but previous lab visits earlier in the year. The research nurse was unaware that these labs carry over in the notes regardless of whether there weren't any labs drawn or not. The patient was provided with a [place] [employer] card to assist with her travel back and forth today as this was an extra visit for her. She was very appreciative for the assistance as she lives almost in [place]. She is in good spirits today and was fasting since 930 pm last night.

## 2018-10-02 NOTE — Progress Notes (Signed)
[place]  Telephone:(336) [phone] Fax:(336) [phone]  ID: MARKEYA MINCY OB: 1950-07-23  MR#: [id number]  DXA#:[id number]  Patient Care Team: [name], MD as PCP - General (Internal Medicine) Bary Castilla [place] Gleason, MD as Consulting Physician (General Surgery)   I connected with [name] on 10/07/18 at  2:30 PM EDT by video enabled telemedicine visit and verified that I am speaking with the correct person using two identifiers.   I discussed the limitations, risks, security and privacy concerns of performing an evaluation and management service by telemedicine and the availability of in-person appointments. I also discussed with the patient that there may be a patient responsible charge related to this service. The patient expressed understanding and agreed to proceed.   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  INTERVAL HISTORY: Patient agreed to further evaluation utilizing video assisted telemedicine.  Her nausea and joint pain have improved since discontinuing letrozole.  She states her hot flashes are unchanged and does not complain of weight gain today.  She continues to have a persistent peripheral neuropathy, but admits this is improving.  She complains of continued hair thinning.  She has no other neurologic complaints.  She denies any recent fevers or illnesses.  She denies any chest pain, shortness of breath, cough, or hemoptysis.  She denies any nausea, vomiting, constipation, or diarrhea.  She has no urinary complaints.  Patient offers no further specific complaints today.  REVIEW OF SYSTEMS:   Review of Systems  Constitutional: Negative.  Negative for fever, malaise/fatigue and weight loss.  Respiratory: Negative.   Negative for cough and shortness of breath.   Cardiovascular: Negative.  Negative for chest pain and leg swelling.  Gastrointestinal: Negative.  Negative for abdominal pain, constipation and nausea.  Genitourinary: Negative.  Negative for dysuria and urgency.  Musculoskeletal: Positive for joint pain. Negative for back pain.  Skin: Negative.  Negative for rash.  Neurological: Positive for tingling and sensory change. Negative for dizziness, focal weakness, weakness and headaches.  Psychiatric/Behavioral: Negative.  The patient is not nervous/anxious.     As per HPI. Otherwise, a complete review of systems is negative.  PAST MEDICAL HISTORY: Past Medical History:  Diagnosis Date  . Anxiety   . Cancer ([place]) 07/2017   left breast  . Depression   . Hypertension   . Hypothyroidism   . Personal history of chemotherapy 2019   LEFT mastectomy-chemo before  . Personal history of radiation therapy 03/2018   LEFT mastectomy  . Rapid heart rate   . Thyroid disease     PAST SURGICAL HISTORY: Past Surgical History:  Procedure Laterality Date  . AXILLARY LYMPH NODE BIOPSY Left 07/16/2017   METASTATIC MAMMARY CARCINOMA  . BREAST BIOPSY Left 07/16/2017   [place] bx of left breast mass and left breast LN.  INVASIVE MAMMARY CARCINOMA, NO SPECIAL TYPE.   Marland Kitchen BREAST EXCISIONAL BIOPSY Right 2001   benign  . BREAST LUMPECTOMY WITH SENTINEL LYMPH NODE BIOPSY Left 12/06/2017   Procedure: BREAST LUMPECTOMY WITH SENTINEL LYMPH NODE BX;  Surgeon: Robert Bellow, MD;  Location: [facility] ORS;  Service: General;  Laterality: Left;  . COLONOSCOPY    . MASTECTOMY Left 12/23/2017  . PORTACATH PLACEMENT Right 08/07/2017   Procedure: INSERTION PORT-A-CATH;  Surgeon: Robert Bellow, MD;  Location: [facility] ORS;  Service: General;  Laterality: Right;  . SIMPLE MASTECTOMY WITH AXILLARY SENTINEL NODE BIOPSY Left 12/23/2017   T2,N2 with 6/7 nodes positive. Whole breast radiation.  Surgeon: Robert Bellow, MD;   Location: [facility] ORS;  Service: General;  Laterality: Left;  . TONSILLECTOMY      FAMILY HISTORY: Family History  Problem Relation Age of Onset  . Stroke Mother   . Thyroid disease Mother   . Renal Disease Mother   . Stroke Father   . Heart attack Father   . Sudden death Father 62       suicide  . Anuerysm Brother   . Breast cancer Neg Hx     ADVANCED DIRECTIVES (Y/N):  N  HEALTH MAINTENANCE: Social History   Tobacco Use  . Smoking status: Current Some Day Smoker    Packs/day: 0.25    Years: 30.00    Pack years: 7.50    Types: Cigarettes  . Smokeless tobacco: Never Used  Substance Use Topics  . Alcohol use: Yes    Comment: occasional q 18mo  . Drug use: Never     Colonoscopy:  PAP:  Bone density:  Lipid panel:  Allergies  Allergen Reactions  . Sulfa Antibiotics Diarrhea    Current Outpatient Medications  Medication Sig Dispense Refill  . acetaminophen (TYLENOL) 500 MG tablet Take 1,000 mg by mouth every 6 (six) hours as needed for moderate pain or headache.     . albuterol (PROVENTIL HFA;VENTOLIN HFA) 108 (90 Base) MCG/ACT inhaler Inhale 2 puffs into the lungs every 6 (six) hours as needed for wheezing or shortness of breath. 1 Inhaler 2  . ALPRAZolam (XANAX) 0.25 MG tablet Take 0.25 mg by mouth 2 (two) times daily.     .Marland KitchenamLODipine (NORVASC) 5 MG tablet Take 5 mg by mouth at bedtime.     .Marland Kitchenaspirin EC 81 MG tablet Take 81 mg by mouth at bedtime.     .Marland KitchenbuPROPion (WELLBUTRIN SR) 150 MG 12 hr tablet Take 150 mg by mouth 2 (two) times daily.    . calcium carbonate (OS-CAL - DOSED IN MG OF ELEMENTAL CALCIUM) 1250 (500 Ca) MG tablet Take 1 tablet by mouth daily with breakfast.    . calcium-vitamin D (OSCAL WITH D) 250-125 MG-UNIT tablet Take 1 tablet by mouth 2 (two) times daily.    . Cholecalciferol (VITAMIN D3) 125 MCG (5000 UT) CAPS Take 1 capsule by mouth 2 (two) times a day.    . levothyroxine (SYNTHROID, LEVOTHROID) 100 MCG tablet Take 100 mcg by mouth daily  before breakfast.    . lidocaine-prilocaine (EMLA) cream Apply to affected area once 30 g 3  . lisinopril (PRINIVIL,ZESTRIL) 10 MG tablet Take 10 mg by mouth daily.    . Magnesium Carbonate Heavy POWD by Does not apply route. Natural Vitality Calm magnesium supplement    . traZODone (DESYREL) 50 MG tablet Take 50 mg by mouth at bedtime.    .Marland Kitchenwarfarin (COUMADIN) 5 MG tablet Take 5 mg by mouth daily.    .Marland Kitchenanastrozole (ARIMIDEX) 1 MG tablet Take 1 tablet (1 mg total) by mouth daily. 90 tablet 3  . letrozole (FEMARA) 2.5 MG tablet Take 1 tablet (2.5 mg total) by mouth daily. (Patient not taking: Reported on 10/05/2018) 30 tablet 3   No current facility-administered medications for this visit.     OBJECTIVE: There were no vitals filed for this visit.   There is no height or weight on file  to calculate BMI.    ECOG FS:0 - Asymptomatic  General: Well-developed, well-nourished, no acute distress. HEENT: Normocephalic. Neuro: Alert, answering all questions appropriately. Cranial nerves grossly intact. Psych: Normal affect.  LAB RESULTS:  Lab Results  Component Value Date   NA 138 01/12/2018   K 4.2 01/12/2018   CL 107 01/12/2018   CO2 24 01/12/2018   GLUCOSE 107 (H) 09/15/2018   BUN 22 01/12/2018   CREATININE 0.88 01/12/2018   CALCIUM 8.9 01/12/2018   PROT 7.2 01/10/2018   ALBUMIN 4.4 01/10/2018   AST 26 01/10/2018   ALT 24 01/10/2018   ALKPHOS 148 (H) 01/10/2018   BILITOT 0.5 01/10/2018   GFRNONAA >60 01/12/2018   GFRAA >60 01/12/2018    Lab Results  Component Value Date   WBC 6.4 04/28/2018   NEUTROABS 4.2 11/14/2017   HGB 12.6 04/28/2018   HCT 37.4 04/28/2018   MCV 88.0 04/28/2018   PLT 192 04/28/2018     STUDIES: No results found.  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  PLAN:    1. Clinical stage IIA ER/PR positive, HER-2 negative invasive carcinoma of the upper out quadrant  of the left breast: Pathology and imaging reviewed independently.  Case was also discussed extensively at case conference.  Patient received neoadjuvant chemotherapy with Adriamycin and Cytoxan.  She only received 4 cycles of weekly Taxol prior to discontinuation of treatment on October 31, 2017 secondary to persistent peripheral neuropathy.  She ultimately required mastectomy and final pathology noted 5 of 6 lymph nodes positive for disease.  Patient completed adjuvant XRT in mid April 2020.  Pain initiated letrozole in May 2020, this was discontinued secondary to side effects and patient was given a prescription for anastrozole today.  She will require a minimum of 5 years of treatment completing in May 2020, but given her high risk disease likely will extend treatment 7 to 10 years.  Return to clinic in 3 months for further evaluation. 2.  Anemia: Resolved.  3.  Peripheral neuropathy: Chronic and unchanged.  Monitor. 4.  Pulmonary embolus: Patient was diagnosed with a small pulmonary embolus on January 10, 2018.  She is no longer on anticoagulation. 5.  Osteopenia: Bone mineral density from February 26, 2018 reported T score of -1.6.  Continue calcium and vitamin D supplementation.  Repeat in February 2021.   6.  Hair thinning: Possibly secondary to letrozole discontinued as above and initiate anastrozole.  I provided 25 minutes of face-to-face video visit time during this encounter, and > 50% was spent counseling as documented under my assessment & plan.    Patient expressed understanding and was in agreement with this plan. She also understands that She can call clinic at any time with any questions, concerns, or complaints.   Cancer Staging Breast cancer, stage 2, left ([place]) Staging form: Breast, AJCC 8th Edition - Clinical stage from 07/30/2017: Stage IIA (cT2, cN1, cM0, G2, ER+, PR+, HER2-) - Signed by Lloyd Huger, MD on 07/30/2017   Lloyd Huger, MD   10/07/2018 3:27 PM

## 2018-10-05 ENCOUNTER — Encounter: Payer: Self-pay | Admitting: Oncology

## 2018-10-05 ENCOUNTER — Other Ambulatory Visit: Payer: Self-pay

## 2018-10-05 NOTE — Progress Notes (Signed)
Patient stated that she had stopped taking her Letrozole and her nausea is gone. Therefore, she stated that she would like to take something else. Patient also would like to know why her hair is not growing.

## 2018-10-06 ENCOUNTER — Inpatient Hospital Stay: Payer: Medicare Other | Attending: Oncology | Admitting: Oncology

## 2018-10-06 DIAGNOSIS — C50912 Malignant neoplasm of unspecified site of left female breast: Secondary | ICD-10-CM | POA: Diagnosis not present

## 2018-10-06 MED ORDER — ANASTROZOLE 1 MG PO TABS: 1.0000 mg | ORAL_TABLET | Freq: Every day | ORAL | 3 refills | Status: DC

## 2018-10-07 DIAGNOSIS — E039 Hypothyroidism, unspecified: Secondary | ICD-10-CM | POA: Diagnosis not present

## 2018-10-07 DIAGNOSIS — I10 Essential (primary) hypertension: Secondary | ICD-10-CM | POA: Diagnosis not present

## 2018-10-07 DIAGNOSIS — K219 Gastro-esophageal reflux disease without esophagitis: Secondary | ICD-10-CM | POA: Diagnosis not present

## 2018-10-07 DIAGNOSIS — K5909 Other constipation: Secondary | ICD-10-CM | POA: Diagnosis not present

## 2018-10-09 DIAGNOSIS — R5381 Other malaise: Secondary | ICD-10-CM | POA: Diagnosis not present

## 2018-10-09 DIAGNOSIS — E034 Atrophy of thyroid (acquired): Secondary | ICD-10-CM | POA: Diagnosis not present

## 2018-10-09 DIAGNOSIS — I10 Essential (primary) hypertension: Secondary | ICD-10-CM | POA: Diagnosis not present

## 2018-11-17 ENCOUNTER — Other Ambulatory Visit: Payer: Self-pay

## 2018-11-17 ENCOUNTER — Telehealth: Payer: Self-pay

## 2018-11-17 ENCOUNTER — Ambulatory Visit
Admission: RE | Admit: 2018-11-17 | Discharge: 2018-11-17 | Disposition: A | Discharge status: Home / Self Care | Payer: Medicare Other | Source: Ambulatory Visit | Attending: Radiation Oncology | Admitting: Radiation Oncology

## 2018-11-17 ENCOUNTER — Encounter: Payer: Self-pay | Admitting: Radiation Oncology

## 2018-11-17 VITALS — BP 133/70 | HR 77 | Temp 97.8°F | Resp 18 | Wt 216.1 lb

## 2018-11-17 DIAGNOSIS — Z79811 Long term (current) use of aromatase inhibitors: Secondary | ICD-10-CM | POA: Diagnosis not present

## 2018-11-17 DIAGNOSIS — C50912 Malignant neoplasm of unspecified site of left female breast: Secondary | ICD-10-CM

## 2018-11-17 DIAGNOSIS — Z923 Personal history of irradiation: Secondary | ICD-10-CM | POA: Diagnosis not present

## 2018-11-17 DIAGNOSIS — C50412 Malignant neoplasm of upper-outer quadrant of left female breast: Secondary | ICD-10-CM | POA: Insufficient documentation

## 2018-11-17 DIAGNOSIS — Z170 Estrogen receptor positive status [ER+]: Secondary | ICD-10-CM | POA: Insufficient documentation

## 2018-11-17 NOTE — Progress Notes (Signed)
Radiation Oncology Follow up Note  Name: Alicia [name]   Date:   11/17/2018 MRN:  [id number] DOB: 05-Aug-1950    This 68 y.o. 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  REFERRING PROVIDER: MCletis Athens MD  HPI: Patient is a 68year old female now about 6 months having completed left chest wall and peripheral emphatic radiation therapy for stage IIb (T2 N1 M0) ER/PR positive HER-2 negative invasive mammary carcinoma.  She also underwent neoadjuvant chemotherapy she is seen today in routine follow-up is doing well.  She specifically denies any chest wall tenderness cough or bone pain.  She is having no swelling in her left upper extremity..  She is currently on Arimidex tolerating that well without side effect.  She had a unilateral right breast mammogram back in August which I have reviewed was BI-RADS 1 negative  COMPLICATIONS OF TREATMENT: none  FOLLOW UP COMPLIANCE: keeps appointments   PHYSICAL EXAM:  BP 133/70   Pulse 77   Temp 97.8 F (36.6 C)   Resp 18   Wt 216 lb 1.6 oz (98 kg)   BMI 32.86 kg/m  Patient status post left modified radical mastectomy chest wall incision is well-healed without evidence of mass or nodularity.  Right breast is free of dominant mass or nodularity.  No axillary or supraclavicular adenopathy is identified no lymphedema of her left upper extremity is noted.  Well-developed well-nourished patient in NAD. HEENT reveals PERLA, EOMI, discs not visualized.  Oral cavity is clear. No oral mucosal lesions are identified. Neck is clear without evidence of cervical or supraclavicular adenopathy. Lungs are clear to A&P. Cardiac examination is essentially unremarkable with regular rate and rhythm without murmur rub or thrill. Abdomen is benign with no  organomegaly or masses noted. Motor sensory and DTR levels are equal and symmetric in the upper and lower extremities. Cranial nerves II through XII are grossly intact. Proprioception is intact. No peripheral adenopathy or edema is identified. No motor or sensory levels are noted. Crude visual fields are within normal range.  RADIOLOGY RESULTS: Mammogram reviewed compatible with above-stated findings  PLAN: Present time patient is now 6 months out doing well with no evidence of disease.  I am pleased with her overall progress.  Of asked to see her back in 6 months for follow-up and then will start start once year follow-up visits.  Patient continues on Arimidex without side effect.  Patient knows to call with any concerns.  I would like to take this opportunity to thank you for allowing me to participate in the care of your patient..Noreene Filbert MD

## 2018-11-17 NOTE — Telephone Encounter (Signed)
Telephone call to patient to discuss SCP visit.   Patient in agreement for me to mail SCP and treatment summary and I will call her next Tuesday 11/10 at 2:00 to review information.  Packet mailed.

## 2018-11-25 ENCOUNTER — Inpatient Hospital Stay: Payer: Medicare Other | Attending: Oncology | Admitting: Oncology

## 2018-11-25 ENCOUNTER — Other Ambulatory Visit: Payer: Self-pay

## 2018-11-25 DIAGNOSIS — G8929 Other chronic pain: Secondary | ICD-10-CM

## 2018-11-25 DIAGNOSIS — C50912 Malignant neoplasm of unspecified site of left female breast: Secondary | ICD-10-CM

## 2018-11-25 DIAGNOSIS — C50412 Malignant neoplasm of upper-outer quadrant of left female breast: Secondary | ICD-10-CM | POA: Insufficient documentation

## 2018-11-25 DIAGNOSIS — Z86711 Personal history of pulmonary embolism: Secondary | ICD-10-CM

## 2018-11-25 DIAGNOSIS — M25552 Pain in left hip: Secondary | ICD-10-CM

## 2018-11-25 DIAGNOSIS — Z9221 Personal history of antineoplastic chemotherapy: Secondary | ICD-10-CM

## 2018-11-25 DIAGNOSIS — N951 Menopausal and female climacteric states: Secondary | ICD-10-CM | POA: Diagnosis not present

## 2018-11-25 DIAGNOSIS — Z170 Estrogen receptor positive status [ER+]: Secondary | ICD-10-CM | POA: Diagnosis not present

## 2018-11-25 DIAGNOSIS — L658 Other specified nonscarring hair loss: Secondary | ICD-10-CM | POA: Diagnosis not present

## 2018-11-25 DIAGNOSIS — M79661 Pain in right lower leg: Secondary | ICD-10-CM

## 2018-11-25 NOTE — Progress Notes (Signed)
Survivorship Care Plan visit completed.  Treatment summary reviewed and previously mailed to patient.  ASCO answers booklet reviewed and given to patient.  CARE program and Cancer Transitions discussed with patient along with other resources cancer center offers to patients and caregivers.  Patient verbalized understanding.  Patient signed up for next Cancer Transition class to start in January.   Also sent referral for Luna Fuse due to patient feeling like she is getting lymphedema.

## 2018-11-25 NOTE — Progress Notes (Signed)
CLINIC:  Survivorship   REASON FOR VISIT:  Routine follow-up for history of breast cancer.   BRIEF ONCOLOGIC HISTORY:  Oncology History Overview Note  Patient is a 68 year old female who recently self palpated a mass on her left breast.  Subsequent imaging and biopsy revealed the above-stated breast cancer.  Case was also discussed extensively at case conference.  Given the size and the stage of patient's malignancy, she will benefit from neoadjuvant chemotherapy using Adriamycin, Cytoxan, and Taxol.  Patient will also require Neulasta support.  Will get CT scan of the chest, abdomen, and pelvis to assess for any metastatic disease.  Patient will also require port placement and MUGA prior to initiating treatment  CT abdomen/pelvis/chest did not reveal any suspicious lesions concerning for metastatic disease. (08/01/17)  Port-A-Cath placed on 08/07/2017.  Cycle 1 day 1 of AC was given on 08/08/17.     Breast cancer, stage 2, left ([place])  07/24/2017 Initial Diagnosis   Breast cancer, stage 2, left ([place])   07/30/2017 Cancer Staging   Staging form: Breast, AJCC 8th Edition - Clinical stage from 07/30/2017: Stage IIA (cT2, cN1, cM0, G2, ER+, PR+, HER2-) - Signed by Lloyd Huger, MD on 07/30/2017   07/31/2017 -  Chemotherapy   The patient had DOXOrubicin (ADRIAMYCIN) chemo injection 130 mg, 60 mg/m2 = 130 mg, Intravenous,  Once, 4 of 4 cycles Administration: 130 mg (08/08/2017), 130 mg (08/22/2017), 130 mg (09/05/2017), 130 mg (09/19/2017) palonosetron (ALOXI) injection 0.25 mg, 0.25 mg, Intravenous,  Once, 4 of 4 cycles Administration: 0.25 mg (08/08/2017), 0.25 mg (08/22/2017), 0.25 mg (09/05/2017), 0.25 mg (09/19/2017) pegfilgrastim-cbqv (UDENYCA) injection 6 mg, 6 mg, Subcutaneous, Once, 4 of 4 cycles Administration: 6 mg (08/09/2017), 6 mg (08/23/2017), 6 mg (09/06/2017), 6 mg (09/20/2017) cyclophosphamide (CYTOXAN) 1,300 mg in sodium chloride 0.9 % 250 mL chemo infusion, 600 mg/m2 = 1,300 mg,  Intravenous,  Once, 4 of 4 cycles Administration: 1,300 mg (08/08/2017), 1,300 mg (08/22/2017), 1,300 mg (09/05/2017), 1,300 mg (09/19/2017) PACLitaxel (TAXOL) 174 mg in sodium chloride 0.9 % 250 mL chemo infusion (</= '80mg'$ /m2), 80 mg/m2 = 174 mg, Intravenous,  Once, 4 of 12 cycles Dose modification: 72 mg/m2 (original dose 80 mg/m2, Cycle 6, Reason: Dose not tolerated) Administration: 174 mg (10/03/2017), 156 mg (10/17/2017), 156 mg (10/24/2017), 156 mg (10/31/2017) fosaprepitant (EMEND) 150 mg, dexamethasone (DECADRON) 12 mg in sodium chloride 0.9 % 145 mL IVPB, , Intravenous,  Once, 4 of 4 cycles Administration:  (08/08/2017),  (08/22/2017),  (09/05/2017),  (09/19/2017)  for chemotherapy treatment.       INTERVAL HISTORY:  Alicia [name] presents to the Survivorship Clinic today for routine follow-up for her history of breast cancer.  Overall, she reports feeling quite well.  She was recently switched from letrozole to Arimidex which has dramatically improved her quality of life.  she continues to have hot flashes but they are not as severe and do not last as long.  She complains of intermittent joint/muscle aches but they have also improved.  She has right knee pain that is chronic since June 2020.  Previous imaging revealed no fracture or abnormality.  She also complains of right calf pain that is intermittent.  Denies any injury.  States her hair is starting to grow back except for the top of her head.  She has reached out to her PCP who recommends a topical shampoo to stimulate hair growth.  She denies any nausea, vomiting, diarrhea or constipation.  She denies any urinary concerns.  She denies  shortness of breath or chest pain.  She thinks she needs her port flushed soon because her last couple appointments have been virtual due to Covid 19 restrictions.  REVIEW OF SYSTEMS:  Review of Systems  Constitutional: Negative for appetite change, fatigue, fever and unexpected weight change.  HENT:   Negative for  nosebleeds, sore throat and trouble swallowing.   Eyes: Negative.   Respiratory: Negative.  Negative for cough, shortness of breath and wheezing.   Cardiovascular: Negative.  Negative for chest pain and leg swelling.  Gastrointestinal: Negative for abdominal pain, blood in stool, constipation, diarrhea, nausea and vomiting.  Endocrine: Positive for hot flashes.  Genitourinary: Negative.  Negative for bladder incontinence, hematuria and nocturia.   Musculoskeletal: Positive for arthralgias and myalgias. Negative for back pain and flank pain.       Left knee pain  Skin: Negative.   Neurological: Negative.  Negative for dizziness, headaches, light-headedness and numbness.  Hematological: Negative.   Psychiatric/Behavioral: Negative.  Negative for confusion. The patient is not nervous/anxious.    Breast: Denies any new nodularity, masses, tenderness, nipple changes, or nipple discharge.     PAST MEDICAL/SURGICAL HISTORY:  Past Medical History:  Diagnosis Date   Anxiety    Cancer ([place]) 07/2017   left breast   Depression    Hypertension    Hypothyroidism    Personal history of chemotherapy 2019   LEFT mastectomy-chemo before   Personal history of radiation therapy 03/2018   LEFT mastectomy   Rapid heart rate    Thyroid disease    Past Surgical History:  Procedure Laterality Date   AXILLARY LYMPH NODE BIOPSY Left 07/16/2017   METASTATIC MAMMARY CARCINOMA   BREAST BIOPSY Left 07/16/2017   [place] bx of left breast mass and left breast LN.  INVASIVE MAMMARY CARCINOMA, NO SPECIAL TYPE.    BREAST EXCISIONAL BIOPSY Right 2001   benign   BREAST LUMPECTOMY WITH SENTINEL LYMPH NODE BIOPSY Left 12/06/2017   Procedure: BREAST LUMPECTOMY WITH SENTINEL LYMPH NODE BX;  Surgeon: Robert Bellow, MD;  Location: [facility] ORS;  Service: General;  Laterality: Left;   COLONOSCOPY     MASTECTOMY Left 12/23/2017   PORTACATH PLACEMENT Right 08/07/2017   Procedure: INSERTION PORT-A-CATH;   Surgeon: Robert Bellow, MD;  Location: [facility] ORS;  Service: General;  Laterality: Right;   SIMPLE MASTECTOMY WITH AXILLARY SENTINEL NODE BIOPSY Left 12/23/2017   T2,N2 with 6/7 nodes positive. Whole breast radiation.  Surgeon: Robert Bellow, MD;  Location: [facility] ORS;  Service: General;  Laterality: Left;   TONSILLECTOMY       ALLERGIES:  Allergies  Allergen Reactions   Sulfa Antibiotics Diarrhea     CURRENT MEDICATIONS:  Outpatient Encounter Medications as of 11/25/2018  Medication Sig   acetaminophen (TYLENOL) 500 MG tablet Take 1,000 mg by mouth every 6 (six) hours as needed for moderate pain or headache.    albuterol (PROVENTIL HFA;VENTOLIN HFA) 108 (90 Base) MCG/ACT inhaler Inhale 2 puffs into the lungs every 6 (six) hours as needed for wheezing or shortness of breath.   ALPRAZolam (XANAX) 0.25 MG tablet Take 0.25 mg by mouth 2 (two) times daily.    amLODipine (NORVASC) 5 MG tablet Take 5 mg by mouth at bedtime.    anastrozole (ARIMIDEX) 1 MG tablet Take 1 tablet (1 mg total) by mouth daily.   aspirin EC 81 MG tablet Take 81 mg by mouth at bedtime.    buPROPion (WELLBUTRIN SR) 150 MG 12 hr tablet Take 150 mg  by mouth 2 (two) times daily.   calcium carbonate (OS-CAL - DOSED IN MG OF ELEMENTAL CALCIUM) 1250 (500 Ca) MG tablet Take 1 tablet by mouth daily with breakfast.   calcium-vitamin D (OSCAL WITH D) 250-125 MG-UNIT tablet Take 1 tablet by mouth 2 (two) times daily.   Cholecalciferol (VITAMIN D3) 125 MCG (5000 UT) CAPS Take 1 capsule by mouth 2 (two) times a day.   letrozole (FEMARA) 2.5 MG tablet Take 1 tablet (2.5 mg total) by mouth daily. (Patient not taking: Reported on 10/05/2018)   levothyroxine (SYNTHROID, LEVOTHROID) 100 MCG tablet Take 100 mcg by mouth daily before breakfast.   lidocaine-prilocaine (EMLA) cream Apply to affected area once   lisinopril (PRINIVIL,ZESTRIL) 10 MG tablet Take 10 mg by mouth daily.   Magnesium Carbonate Heavy POWD by  Does not apply route. Natural Vitality Calm magnesium supplement   rosuvastatin (CRESTOR) 5 MG tablet Take 5 mg by mouth daily.   traZODone (DESYREL) 50 MG tablet Take 50 mg by mouth at bedtime.   warfarin (COUMADIN) 5 MG tablet Take 5 mg by mouth daily.   No facility-administered encounter medications on file as of 11/25/2018.      ONCOLOGIC FAMILY HISTORY:  Family History  Problem Relation Age of Onset   Stroke Mother    Thyroid disease Mother    Renal Disease Mother    Stroke Father    Heart attack Father    Sudden death Father 4       suicide   Anuerysm Brother    Breast cancer Neg Hx     GENETIC COUNSELING/TESTING: None  SOCIAL HISTORY:  Alicia [name] is single and lives alone in [place].  Alicia [name] is currently retired.  She denies any current or history of tobacco, alcohol, or illicit drug use.     PHYSICAL EXAMINATION:  Vital Signs: There were no vitals filed for this visit. There were no vitals filed for this visit. Limited d/t telephone visit.   LABORATORY DATA:  Lab Results  Component Value Date   WBC 6.4 04/28/2018   HGB 12.6 04/28/2018   HCT 37.4 04/28/2018   MCV 88.0 04/28/2018   PLT 192 04/28/2018      Chemistry      Component Value Date/Time   NA 138 01/12/2018 0725   NA 142 02/11/2011 1349   K 4.2 01/12/2018 0725   K 3.8 02/11/2011 1349   CL 107 01/12/2018 0725   CL 107 02/11/2011 1349   CO2 24 01/12/2018 0725   CO2 24 02/11/2011 1349   BUN 22 01/12/2018 0725   BUN 14 02/11/2011 1349   CREATININE 0.88 01/12/2018 0725   CREATININE 0.65 02/11/2011 1349      Component Value Date/Time   CALCIUM 8.9 01/12/2018 0725   CALCIUM 9.0 02/11/2011 1349   ALKPHOS 148 (H) 01/10/2018 1954   ALKPHOS 122 02/11/2011 1349   AST 26 01/10/2018 1954   AST 27 02/11/2011 1349   ALT 24 01/10/2018 1954   ALT 36 02/11/2011 1349   BILITOT 0.5 01/10/2018 1954   BILITOT 0.5 02/11/2011 1349      DIAGNOSTIC IMAGING:    Mammogram on 08/21/2018  IMPRESSION: No mammographic evidence of malignancy. A result letter of this screening mammogram will be mailed directly to the patient.  RECOMMENDATION: Screening mammogram in one year.  (Code:SM-R-15M)  BI-RADS CATEGORY  1: Negative.  ASSESSMENT AND PLAN:  Alicia [name] is a pleasant 68 y.o. female with history of Stage IIA left breast  ER+/PR+/HER2-, diagnosed in  Patient received neoadjuvant chemotherapy with Adriamycin and Cytoxan.  She only received 4 cycles of weekly Taxol prior to discontinuation of treatment on October 31, 2017 secondary to persistent peripheral neuropathy.  She ultimately required mastectomy and final pathology noted 5 of 6 lymph nodes positive for disease.  Patient completed adjuvant XRT in mid April 2020.  Pain initiated letrozole in May 2020, this was discontinued secondary to side effects and patient was given a prescription for anastrozole today. She presents to the Survivorship Clinic for surveillance and routine follow-up.   1. History of breast cancer:  Alicia [name] is currently clinically and radiographically without evidence of disease or recurrence of breast cancer. She will be due for mammogram in August 2021.  She will continue her anti-estrogen therapy with Armidex, with plans to continue for 5-10 years.  She will return to the cancer center to see her medical oncologist, Dr. Grayland Ormond  in December 2020.  I encouraged her to call me with any questions or concerns before her next visit at the cancer center, and I would be happy to see her sooner, if needed.    #. Problem(s) at Visit: Left knee joint pain, right calf pain, neuropathy improving, hair is not coming back in on top of head  Left knee pain/right calf pain: Previous imaging was negative.  Given history of pulmonary embolus earlier this year and she is currently not on a blood thinner will recommend bilateral Dopplers to rule out blood clot.  If Dopplers are negative will  refer to orthopedic for further evaluation.  Alopecia: She has spoken to her PCP who recommends topical growth stimulating products.  I recommended trying Rogaine.  #. Bone health:  Given Alicia [name]'s age, history of breast cancer, and her current anti-estrogen therapy with Arimedex, she is at risk for bone demineralization. Her last DEXA scan was on 02/26/18 showing a T-score of -2.5. She is taking calcium and Vitamin D as instructed. May benefit from bisphosphonates in the future.  in the meantime, she was encouraged to increase her consumption of foods rich in calcium, as well as increase her weight-bearing activities.  She was given education on specific food and activities to promote bone health.  #. Cancer screening:  Due to Alicia [name]'s history and her age, she should receive screening for skin cancers, colon cancer, and gynecologic cancers. She was encouraged to follow-up with her PCP for appropriate cancer screenings.   #. Health maintenance and wellness promotion: Alicia [name] was encouraged to consume 5-7 servings of fruits and vegetables per day. She was also encouraged to engage in moderate to vigorous exercise for 30 minutes per day most days of the week. She was instructed to limit her alcohol consumption and continue to abstain from tobacco use.    Dispo:  -Return to cancer center ASAP for [employer].  - Referral to orthopedics for left knee pain.  - RLE doppler to rule out blood clot. - RTC as scheduled in December for labs and [facility] appt.   A total of (30) minutes of face-to-face time was spent with this patient with greater than 50% of that time in counseling and care-coordination.   Rulon Abide, NP AGNP-C [place] [phone]   Note: PRIMARY CARE PROVIDER [name], [place] [phone]

## 2018-11-26 ENCOUNTER — Other Ambulatory Visit: Payer: Self-pay

## 2018-11-26 ENCOUNTER — Inpatient Hospital Stay: Payer: Medicare Other

## 2018-11-26 ENCOUNTER — Inpatient Hospital Stay: Payer: Medicare Other | Admitting: Occupational Therapy

## 2018-11-26 DIAGNOSIS — C50412 Malignant neoplasm of upper-outer quadrant of left female breast: Secondary | ICD-10-CM | POA: Diagnosis not present

## 2018-11-26 DIAGNOSIS — L905 Scar conditions and fibrosis of skin: Secondary | ICD-10-CM

## 2018-11-26 DIAGNOSIS — Z95828 Presence of other vascular implants and grafts: Secondary | ICD-10-CM

## 2018-11-26 MED ORDER — HEPARIN SOD (PORK) LOCK FLUSH 100 UNIT/ML IV SOLN
500.0000 [IU] | Freq: Once | INTRAVENOUS | Status: AC
  Administered 2018-11-26: 12:00:00 500 [IU] via INTRAVENOUS

## 2018-11-26 MED ORDER — SODIUM CHLORIDE 0.9% FLUSH
10.0000 mL | Freq: Once | INTRAVENOUS | Status: AC
  Administered 2018-11-26: 10 mL via INTRAVENOUS
  Filled 2018-11-26: qty 10

## 2018-11-26 NOTE — Therapy (Signed)
[facility] [address] [place], [place], [place], [address] Phone: [phone]   Fax:  [phone]  Occupational Therapy Evaluation  Patient Details  Name: Alicia [name] MRN: [id number] Date of Birth: 03-16-1950 No data recorded  Encounter Date: 11/26/2018  OT End of Session - 11/26/18 1152    Visit Number  1       Past Medical History:  Diagnosis Date  . Anxiety   . Cancer ([place]) 07/2017   left breast  . Depression   . Hypertension   . Hypothyroidism   . Personal history of chemotherapy 2019   LEFT mastectomy-chemo before  . Personal history of radiation therapy 03/2018   LEFT mastectomy  . Rapid heart rate   . Thyroid disease     Past Surgical History:  Procedure Laterality Date  . AXILLARY LYMPH NODE BIOPSY Left 07/16/2017   METASTATIC MAMMARY CARCINOMA  . BREAST BIOPSY Left 07/16/2017   [place] bx of left breast mass and left breast LN.  INVASIVE MAMMARY CARCINOMA, NO SPECIAL TYPE.   [place] BREAST EXCISIONAL BIOPSY Right 2001   benign  . BREAST LUMPECTOMY WITH SENTINEL LYMPH NODE BIOPSY Left 12/06/2017   Procedure: BREAST LUMPECTOMY WITH SENTINEL LYMPH NODE BX;  Surgeon: Robert Bellow, MD;  Location: [facility] ORS;  Service: General;  Laterality: Left;  . COLONOSCOPY    . MASTECTOMY Left 12/23/2017  . PORTACATH PLACEMENT Right 08/07/2017   Procedure: INSERTION PORT-A-CATH;  Surgeon: Robert Bellow, MD;  Location: [facility] ORS;  Service: General;  Laterality: Right;  . SIMPLE MASTECTOMY WITH AXILLARY SENTINEL NODE BIOPSY Left 12/23/2017   T2,N2 with 6/7 nodes positive. Whole breast radiation.  Surgeon: Robert Bellow, MD;  Location: [facility] ORS;  Service: General;  Laterality: Left;  . TONSILLECTOMY      There were no vitals filed for this visit.  Subjective Assessment - 11/26/18 1148    Subjective   I talked to [facility] from survivorship and was telling her my sleeve feels tight on the L at times , and scar  still tight with reaching over head -also tender          LYMPHEDEMA/ONCOLOGY QUESTIONNAIRE - 11/26/18 1150      Right Upper Extremity Lymphedema   15 cm Proximal to Olecranon Process  35 cm    10 cm Proximal to Olecranon Process  31.5 cm    Olecranon Process  26.5 cm    15 cm Proximal to Ulnar Styloid Process  24.5 cm    10 cm Proximal to Ulnar Styloid Process  20.5 cm    Just Proximal to Ulnar Styloid Process  15 cm    Across Hand at [employer]  19 cm    At [place] of 2nd Digit  5.7 cm    At [facility] of Thumb  6 cm      Left Upper Extremity Lymphedema   15 cm Proximal to Olecranon Process  34 cm    10 cm Proximal to Olecranon Process  33 cm    Olecranon Process  26.4 cm    15 cm Proximal to Ulnar Styloid Process  24 cm    10 cm Proximal to Ulnar Styloid Process  21 cm    Just Proximal to Ulnar Styloid Process  15.5 cm    Across Hand at [employer]  19 cm    At [place] of 2nd Digit  5.5 cm    At [place] of Thumb  6  cm        Patient signed up for next Cancer Transition class to start in January.  Was seen by Faythe Casa NP and refered to [facility] Fuse due to patient feeling like she is getting lymphedema.   Medical history :  Alicia [name] is a pleasant 68 y.o. female with history of Stage IIA left breast  ER+/PR+/HER2-, diagnosed in  Patient received neoadjuvant chemotherapy with Adriamycin and Cytoxan. She only received 4 cycles of weekly Taxol prior to discontinuation of treatment on October 31, 2017 secondary to persistent peripheral neuropathy. She ultimately required mastectomy and final pathology noted 5 of 6 lymph nodes positive for disease. Patient completed adjuvant XRT in mid April 2020. Pain initiated letrozole in May 2020, this was discontinued secondary to side effects and patient was given a prescription for anastrozole today. She presents to the Survivorship Clinic for surveillance and routine follow-up.   1. History of breast cancer:  Alicia [name] is  currently clinically and radiographically without evidence of disease or recurrence of breast cancer. She will be due for mammogram in August 2021.  She will continue her anti-estrogen therapy with Armidex, with plans to continue for 5-10 years.  She will return to the cancer center to see her medical oncologist, Dr. Grayland Ormond  in December 2020.  I encouraged her to call me with any questions or concerns before her next visit at the cancer center, and I would be happy to see her sooner, if needed.     Pt measurement appear WNL - except mid upperarm - did recommend for her to get over the counter compression sleeve to wear when lifting anything heavy - like water bottles, doing vacuuming or laundry  She can be in stage 1 lymphedema - that it increase only with high risk activities Will measure her in 2-3 wks  Again  In meantime - pt scar on L chest still adhere/tender and limiting her over head AROM in L arm  Scar massage taught and desensitization tech - for massage, soft textures and rougher textures if she can tolerate  Pt to also do AAROM for shoulder flexion and ABD on wall   Keep pain or pull under 2/10           OT Education - 11/26/18 1152    Education Details  Lymphedema info , HEP    Person(s) Educated  Patient    Methods  Explanation;Demonstration;Tactile cues;Verbal cues;Handout    Comprehension  Verbalized understanding                   Patient will benefit from skilled therapeutic intervention in order to improve the following deficits and impairments:           Visit Diagnosis: Scar condition and fibrosis of skin    Problem List Patient Active Problem List   Diagnosis Date Noted  . Pulmonary embolism ([place]) 01/11/2018  . Sepsis ([place]) 10/05/2017  . Breast cancer, stage 2, left ([place]) 07/24/2017  . Elevated troponin 02/17/2015    Rosalyn Gess OTR/L,CLT 11/26/2018, 11:53 AM  [facility] [address] [place], [place], [place], [address] Phone: [phone]   Fax:  [phone]  Name: Alicia [name] MRN: [id number] Date of Birth: Feb 19, 1950

## 2018-12-01 ENCOUNTER — Ambulatory Visit
Admission: RE | Admit: 2018-12-01 | Discharge: 2018-12-01 | Disposition: A | Discharge status: Home / Self Care | Payer: Medicare Other | Source: Ambulatory Visit | Attending: Oncology | Admitting: Oncology

## 2018-12-01 ENCOUNTER — Other Ambulatory Visit: Payer: Self-pay

## 2018-12-01 DIAGNOSIS — G8929 Other chronic pain: Secondary | ICD-10-CM | POA: Diagnosis not present

## 2018-12-01 DIAGNOSIS — M25561 Pain in right knee: Secondary | ICD-10-CM | POA: Diagnosis not present

## 2018-12-01 DIAGNOSIS — M79661 Pain in right lower leg: Secondary | ICD-10-CM | POA: Insufficient documentation

## 2018-12-01 DIAGNOSIS — M25562 Pain in left knee: Secondary | ICD-10-CM | POA: Diagnosis not present

## 2018-12-02 NOTE — Progress Notes (Signed)
Negative for lower extremity dvt. Patient given results over the phone.   New Complaint: Constipation  Recommend MiraLAX and Senokot daily.  Patient is willing to give this a try.  I explained she can call clinic with any additional concerns or if she is not successful with these medications.  Patient verbalized understanding.  Faythe Casa, NP 12/02/2018 10:54 AM

## 2018-12-08 DIAGNOSIS — E039 Hypothyroidism, unspecified: Secondary | ICD-10-CM | POA: Diagnosis not present

## 2018-12-08 DIAGNOSIS — I10 Essential (primary) hypertension: Secondary | ICD-10-CM | POA: Diagnosis not present

## 2018-12-08 DIAGNOSIS — E669 Obesity, unspecified: Secondary | ICD-10-CM | POA: Diagnosis not present

## 2018-12-10 ENCOUNTER — Inpatient Hospital Stay: Payer: Medicare Other | Admitting: Occupational Therapy

## 2018-12-10 ENCOUNTER — Other Ambulatory Visit: Payer: Self-pay

## 2018-12-10 DIAGNOSIS — L905 Scar conditions and fibrosis of skin: Secondary | ICD-10-CM

## 2018-12-10 NOTE — Therapy (Signed)
[facility] [address] [place], [place], [place], [address] Phone: [phone]   Fax:  [phone]  Occupational Therapy Screen  Patient Details  Name: Alicia [name] MRN: [id number] Date of Birth: Aug 23, 1950 No data recorded  Encounter Date: 12/10/2018    Past Medical History:  Diagnosis Date  . Anxiety   . Cancer ([place]) 07/2017   left breast  . Depression   . Hypertension   . Hypothyroidism   . Personal history of chemotherapy 2019   LEFT mastectomy-chemo before  . Personal history of radiation therapy 03/2018   LEFT mastectomy  . Rapid heart rate   . Thyroid disease     Past Surgical History:  Procedure Laterality Date  . AXILLARY LYMPH NODE BIOPSY Left 07/16/2017   METASTATIC MAMMARY CARCINOMA  . BREAST BIOPSY Left 07/16/2017   [place] bx of left breast mass and left breast LN.  INVASIVE MAMMARY CARCINOMA, NO SPECIAL TYPE.   Marland Kitchen BREAST EXCISIONAL BIOPSY Right 2001   benign  . BREAST LUMPECTOMY WITH SENTINEL LYMPH NODE BIOPSY Left 12/06/2017   Procedure: BREAST LUMPECTOMY WITH SENTINEL LYMPH NODE BX;  Surgeon: Robert Bellow, MD;  Location: [facility] ORS;  Service: General;  Laterality: Left;  . COLONOSCOPY    . MASTECTOMY Left 12/23/2017  . PORTACATH PLACEMENT Right 08/07/2017   Procedure: INSERTION PORT-A-CATH;  Surgeon: Robert Bellow, MD;  Location: [facility] OR[facility];  Service: General;  Laterality: Right;  . SIMPLE MASTECTOMY WITH AXILLARY SENTINEL NODE BIOPSY Left 12/23/2017   T2,N2 with 6/7 nodes positive. Whole breast radiation.  Surgeon: Robert Bellow, MD;  Location: [facility] ORS;  Service: General;  Laterality: Left;  . TONSILLECTOMY      There were no vitals filed for this visit.  Subjective Assessment - 12/10/18 1134    Subjective   I did the massage and exercises - scar feels looser and not as tender - my range of motion is better with doing my stretches on the wall          LYMPHEDEMA/ONCOLOGY  QUESTIONNAIRE - 12/10/18 1104      Left Upper Extremity Lymphedema   15 cm Proximal to Olecranon Process  35 cm    10 cm Proximal to Olecranon Process  33.5 cm    Olecranon Process  26 cm    15 cm Proximal to Ulnar Styloid Process  24.5 cm    10 cm Proximal to Ulnar Styloid Process  20.3 cm    Just Proximal to Ulnar Styloid Process  15.5 cm         Patient signed up for next Cancer Transition class to start in January. Was seen by Faythe Casa NP and refered to Luna Fuse due to patient feeling like she is getting lymphedema.   Medical history :  [name] a pleasant 68 y.o.female with history of StageIIAleft breastER+/PR+/HER2-, diagnosed inPatient received neoadjuvant chemotherapy with Adriamycin and Cytoxan. She only received 4 cycles of weekly Taxol prior to discontinuation of treatment on October 31, 2017 secondary to persistent peripheral neuropathy. She ultimately required mastectomy and final pathology noted 5 of 6 lymph nodes positive for disease. Patient completed adjuvant XRT in mid April 2020. Pain initiated letrozole in May 2020, this was discontinued secondary to side effects and patient was given a prescription for anastrozole.   1. History of breast cancer:Ms.Holyfieldis currently clinically and radiographically without evidence ofdisease orrecurrence of breast cancer. She will be due for mammogram inAugust 2021. She will continue her anti-estrogen  therapy with Armidex, with plans to continue for5-10years. She will return to the cancer center to see her medical oncologist, Dr. Bethanie Dicker December 2020.     OT note from 11/26/2018  Pt measurement appear WNL - except mid upperarm - did recommend for her to get over the counter compression sleeve to wear when lifting anything heavy - like water bottles, doing vacuuming or laundry  She can be in stage 1 lymphedema - that it increase only with high risk activities Will measure her in 2-3  wks  Again  In meantime - pt scar on L chest still adhere/tender and limiting her over head AROM in L arm  Scar massage taught and desensitization tech - for massage, soft textures and rougher textures if she can tolerate  Pt to also do AAROM for shoulder flexion and ABD on wall   Keep pain or pull under 2/10   Today's note:  Pt scar improved greatly - less adhere and tolerating massage, and soft textures much better  Pt to focus now on scar mobs with arm over head - and soft and rougher textures - several times during day.  pt showed increase flexion and ABD of L shoulder with AAROM on wall   pt can start next week 1 lbs weight for shoulder flexion Y - off wall - if no pain  10 reps  1 x day Pt ed on donning over the counter compression sleeve for high risk activities - her mid upper arm is still increased -and she report feeling full , heavy and tight feeling when using her arm a lot or lifting heavy activities.  Stage 1 lymphedema  Will follow up with me again in 3 wks                                    Patient will benefit from skilled therapeutic intervention in order to improve the following deficits and impairments:           Visit Diagnosis: Scar condition and fibrosis of skin    Problem List Patient Active Problem List   Diagnosis Date Noted  . Pulmonary embolism ([place]) 01/11/2018  . Sepsis ([place]) 10/05/2017  . Breast cancer, stage 2, left ([place]) 07/24/2017  . Elevated troponin 02/17/2015    Rosalyn Gess OTR/l,CLT 12/10/2018, 11:36 AM  [facility] [address] [place], [place], [place], [address] Phone: [phone]   Fax:  [phone]  Name: Alicia [name] MRN: [id number] Date of Birth: 18-Aug-1950

## 2018-12-31 ENCOUNTER — Other Ambulatory Visit: Payer: Self-pay

## 2018-12-31 ENCOUNTER — Inpatient Hospital Stay: Payer: Medicare Other | Attending: Oncology | Admitting: Occupational Therapy

## 2018-12-31 DIAGNOSIS — Z923 Personal history of irradiation: Secondary | ICD-10-CM | POA: Insufficient documentation

## 2018-12-31 DIAGNOSIS — F17210 Nicotine dependence, cigarettes, uncomplicated: Secondary | ICD-10-CM | POA: Insufficient documentation

## 2018-12-31 DIAGNOSIS — C50412 Malignant neoplasm of upper-outer quadrant of left female breast: Secondary | ICD-10-CM | POA: Insufficient documentation

## 2018-12-31 DIAGNOSIS — G629 Polyneuropathy, unspecified: Secondary | ICD-10-CM | POA: Insufficient documentation

## 2018-12-31 DIAGNOSIS — E039 Hypothyroidism, unspecified: Secondary | ICD-10-CM | POA: Insufficient documentation

## 2018-12-31 DIAGNOSIS — L905 Scar conditions and fibrosis of skin: Secondary | ICD-10-CM

## 2018-12-31 DIAGNOSIS — I10 Essential (primary) hypertension: Secondary | ICD-10-CM | POA: Insufficient documentation

## 2018-12-31 DIAGNOSIS — Z170 Estrogen receptor positive status [ER+]: Secondary | ICD-10-CM | POA: Insufficient documentation

## 2018-12-31 DIAGNOSIS — M8580 Other specified disorders of bone density and structure, unspecified site: Secondary | ICD-10-CM | POA: Insufficient documentation

## 2018-12-31 NOTE — Therapy (Signed)
[facility] [address] [place], [place], [place], [address] Phone: [phone]   Fax:  [phone]  Occupational Therapy Treatment  Patient Details  Name: Alicia [name] MRN: [id number] Date of Birth: Apr 15, 1950 No data recorded  Encounter Date: 12/31/2018  OT End of Session - 12/31/18 1115    Visit Number  0       Past Medical History:  Diagnosis Date  . Anxiety   . Cancer ([place]) 07/2017   left breast  . Depression   . Hypertension   . Hypothyroidism   . Personal history of chemotherapy 2019   LEFT mastectomy-chemo before  . Personal history of radiation therapy 03/2018   LEFT mastectomy  . Rapid heart rate   . Thyroid disease     Past Surgical History:  Procedure Laterality Date  . AXILLARY LYMPH NODE BIOPSY Left 07/16/2017   METASTATIC MAMMARY CARCINOMA  . BREAST BIOPSY Left 07/16/2017   [place] bx of left breast mass and left breast LN.  INVASIVE MAMMARY CARCINOMA, NO SPECIAL TYPE.   Marland Kitchen BREAST EXCISIONAL BIOPSY Right 2001   benign  . BREAST LUMPECTOMY WITH SENTINEL LYMPH NODE BIOPSY Left 12/06/2017   Procedure: BREAST LUMPECTOMY WITH SENTINEL LYMPH NODE BX;  Surgeon: Robert Bellow, MD;  Location: [facility];  Service: General;  Laterality: Left;  . COLONOSCOPY    . MASTECTOMY Left 12/23/2017  . PORTACATH PLACEMENT Right 08/07/2017   Procedure: INSERTION PORT-A-CATH;  Surgeon: Robert Bellow, MD;  Location: [facility];  Service: General;  Laterality: Right;  . SIMPLE MASTECTOMY WITH AXILLARY SENTINEL NODE BIOPSY Left 12/23/2017   T2,N2 with 6/7 nodes positive. Whole breast radiation.  Surgeon: Robert Bellow, MD;  Location: [facility];  Service: General;  Laterality: Left;  . TONSILLECTOMY      There were no vitals filed for this visit.  Subjective Assessment - 12/31/18 1115    Subjective   I did ge my sleeve adn wearing it only when I do some house work or lift/carry something heavy-my scar is much  better and my motion in L arm    Currently in Pain?  No/denies          LYMPHEDEMA/ONCOLOGY QUESTIONNAIRE - 12/31/18 1114      Left Upper Extremity Lymphedema   15 cm Proximal to Olecranon Process  35 cm    10 cm Proximal to Olecranon Process  33.5 cm    Olecranon Process  26 cm    15 cm Proximal to Ulnar Styloid Process  24.5 cm    10 cm Proximal to Ulnar Styloid Process  20.3 cm    Just Proximal to Ulnar Styloid Process  15.5 cm    Across Hand at [employer]  19 cm         Patient signed up for next Cancer Transition class to start in January.Was seen by Faythe Casa NP andrefered toMaureen Dupree due to patient feeling like she is getting lymphedema.   Medical history : [name] a pleasant 68 y.o.female with history of StageIIAleft breastER+/PR+/HER2-, diagnosed inPatient received neoadjuvant chemotherapy with Adriamycin and Cytoxan. She only received 4 cycles of weekly Taxol prior to discontinuation of treatment on October 31, 2017 secondary to persistent peripheral neuropathy. She ultimately required mastectomy and final pathology noted 5 of 6 lymph nodes positive for disease. Patient completed adjuvant XRT in mid April 2020. Pain initiated letrozole in May 2020, this was discontinued secondary to side effects and patient was given  a prescription for anastrozole.   1. History of breast cancer:Alicia WaltonHolyfieldis currently clinically and radiographically without evidence ofdisease orrecurrence of breast cancer. She will be due for mammogram inAugust 2021. She will continue her anti-estrogen therapy with Armidex, with plans to continue for5-10years. She will return to the cancer center to see her medical oncologist, Dr. Bethanie Dicker December 2020.    OT note from 11/26/2018  Pt measurement appear WNL - except mid upperarm - did recommend for her to get over the counter compression sleeve to wear when lifting anything heavy - like water  bottles, doing vacuuming or laundry  She can be in stage 1 lymphedema - that it increase only with high risk activities Will measure her in 2-3 wks Again  In meantime - pt scar on L chest still adhere/tender and limiting her over head AROM in L arm  Scar massage taught and desensitization tech - for massage, soft textures and rougher textures if she can tolerate  Pt to also do AAROM for shoulder flexion and ABD on wall  Keep pain or pull under 2/10  Note:12/10/2018  Pt scar improved greatly - less adhere and tolerating massage, and soft textures much better  Pt to focus now on scar mobs with arm over head - and soft and rougher textures - several times during day.  pt showed increase flexion and ABD of L shoulder with AAROM on wall   pt can start next week 1 lbs weight for shoulder flexion Y - off wall - if no pain  10 reps  1 x day Pt ed on donning over the counter compression sleeve for high risk activities - her mid upper arm is still increased -and she report feeling full , heavy and tight feeling when using her arm a lot or lifting heavy activities.  Stage 1 lymphedema  Will follow up with me again in 3 wks    Today's note:  Pt AROM in L shoulder WNL - very little pull Scar adhesion improved greatly - still adhere at T intersection on lateral chest - if arm is  over head - pt lower her arm as she do the scar massage - pt to do it in supine while doing scar massage in that area  to keep arm over head with more ease Pt measurements still increase in the mid upper arm - and sleeves still tight - pt can wear over the counter sleeve more during day for the next 4 wks and will reassess in month  Cont with her same HEP  But add this date scapula squeezes - 4 x day 10 reps  Stage 1 lymphedema in L UE  Follow up in 4 wks                                             Visit Diagnosis: Scar condition and fibrosis of skin    Problem  List Patient Active Problem List   Diagnosis Date Noted  . Pulmonary embolism ([place]) 01/11/2018  . Sepsis ([place]) 10/05/2017  . Breast cancer, stage 2, left ([place]) 07/24/2017  . Elevated troponin 02/17/2015    Alicia [name] OTR/L,CLT 12/31/2018, 11:16 AM  [facility] [address] [place], [place], [place], [address] Phone: [phone]   Fax:  [phone]  Name: KARIANA WILES MRN: [id number] Date of Birth: 05/17/1950

## 2019-01-01 NOTE — Progress Notes (Signed)
[facility]  [phone] [phone]  ID: Alicia [name] OB: May 26, 1950  MR#: [id number]  TWS#:[id number]  Patient Care Team: [name], MD as PCP - General (Internal Medicine) Rico Junker, RN as Registered Nurse Lloyd Huger, MD as Consulting Physician (Oncology) Bary Castilla Forest Gleason, MD as Consulting Physician (General Surgery) Bary Castilla Forest Gleason, MD as Consulting Physician (General Surgery) Noreene Filbert, MD as Referring Physician (Radiation Oncology)   CHIEF COMPLAINT: Clinical stage IIA ER/PR positive, HER-2 negative invasive carcinoma of the upper out quadrant of the left breast.  INTERVAL HISTORY: Patient returns to clinic today for routine 32-monthevaluation.  She continues to complain of occasional hot flashes and joint pain, but these are significantly improved since discontinuing letrozole and initiating anastrozole.  She otherwise feels well.  She does not complain of a peripheral neuropathy today.  She has no other neurologic complaints.  She denies any recent fevers or illnesses.  She denies any chest pain, shortness of breath, cough, or hemoptysis.  She denies any nausea, vomiting, constipation, or diarrhea.  She has no urinary complaints.  Patient offers no further specific complaints today.  REVIEW OF SYSTEMS:   Review of Systems  Constitutional: Negative.  Negative for fever, malaise/fatigue and weight loss.  Respiratory: Negative.  Negative for cough and shortness of breath.   Cardiovascular: Negative.  Negative for chest pain and leg swelling.  Gastrointestinal: Negative.  Negative for abdominal pain, constipation and nausea.  Genitourinary: Negative.  Negative for dysuria and urgency.  Musculoskeletal: Positive for joint pain. Negative for back pain.  Skin: Negative.  Negative for rash.  Neurological: Positive for tingling and sensory change. Negative for dizziness, focal weakness, weakness and headaches.    Psychiatric/Behavioral: Negative.  The patient is not nervous/anxious.     As per HPI. Otherwise, a complete review of systems is negative.  PAST MEDICAL HISTORY: Past Medical History:  Diagnosis Date  . Anxiety   . Cancer ([place] 07/2017   left breast  . Depression   . Hypertension   . Hypothyroidism   . Personal history of chemotherapy 2019   LEFT mastectomy-chemo before  . Personal history of radiation therapy 03/2018   LEFT mastectomy  . Rapid heart rate   . Thyroid disease     PAST SURGICAL HISTORY: Past Surgical History:  Procedure Laterality Date  . AXILLARY LYMPH NODE BIOPSY Left 07/16/2017   METASTATIC MAMMARY CARCINOMA  . BREAST BIOPSY Left 07/16/2017   [place] of left breast mass and left breast LN.  INVASIVE MAMMARY CARCINOMA, NO SPECIAL TYPE.   .Marland KitchenBREAST EXCISIONAL BIOPSY Right 2001   benign  . BREAST LUMPECTOMY WITH SENTINEL LYMPH NODE BIOPSY Left 12/06/2017   Procedure: BREAST LUMPECTOMY WITH SENTINEL LYMPH NODE BX;  Surgeon: BRobert Bellow MD;  Location: [facility] OR[facility];  Service: General;  Laterality: Left;  . COLONOSCOPY    . MASTECTOMY Left 12/23/2017  . PORTACATH PLACEMENT Right 08/07/2017   Procedure: INSERTION PORT-A-CATH;  Surgeon: BRobert Bellow MD;  Location: [facility] OR[facility];  Service: General;  Laterality: Right;  . SIMPLE MASTECTOMY WITH AXILLARY SENTINEL NODE BIOPSY Left 12/23/2017   T2,N2 with 6/7 nodes positive. Whole breast radiation.  Surgeon: BRobert Bellow MD;  Location: [facility] OR[facility];  Service: General;  Laterality: Left;  . TONSILLECTOMY      FAMILY HISTORY: Family History  Problem Relation Age of Onset  . Stroke Mother   . Thyroid disease Mother   . Renal Disease Mother   . Stroke  Father   . Heart attack Father   . Sudden death Father 61       suicide  . Anuerysm Brother   . Breast cancer Neg Hx     ADVANCED DIRECTIVES (Y/N):  N  HEALTH MAINTENANCE: Social History   Tobacco Use  . Smoking status: Current Some Day Smoker     Packs/day: 0.25    Years: 30.00    Pack years: 7.50    Types: Cigarettes  . Smokeless tobacco: Never Used  Substance Use Topics  . Alcohol use: Yes    Comment: occasional q 100mo  . Drug use: Never     Colonoscopy:  PAP:  Bone density:  Lipid panel:  Allergies  Allergen Reactions  . Sulfa Antibiotics Diarrhea    Current Outpatient Medications  Medication Sig Dispense Refill  . acetaminophen (TYLENOL) 500 MG tablet Take 1,000 mg by mouth every 6 (six) hours as needed for moderate pain or headache.     . albuterol (PROVENTIL HFA;VENTOLIN HFA) 108 (90 Base) MCG/ACT inhaler Inhale 2 puffs into the lungs every 6 (six) hours as needed for wheezing or shortness of breath. 1 Inhaler 2  . ALPRAZolam (XANAX) 0.25 MG tablet Take 0.25 mg by mouth 2 (two) times daily.     .Marland KitchenamLODipine (NORVASC) 5 MG tablet Take 5 mg by mouth at bedtime.     .Marland Kitchenanastrozole (ARIMIDEX) 1 MG tablet Take 1 tablet (1 mg total) by mouth daily. 90 tablet 3  . aspirin EC 81 MG tablet Take 81 mg by mouth at bedtime.     .Marland KitchenbuPROPion (WELLBUTRIN SR) 150 MG 12 hr tablet Take 150 mg by mouth 2 (two) times daily.    . calcium carbonate (OS-CAL - DOSED IN MG OF ELEMENTAL CALCIUM) 1250 (500 Ca) MG tablet Take 1 tablet by mouth daily with breakfast.    . calcium-vitamin D (OSCAL WITH D) 250-125 MG-UNIT tablet Take 1 tablet by mouth 2 (two) times daily.    . Cholecalciferol (VITAMIN D3) 125 MCG (5000 UT) CAPS Take 1 capsule by mouth 2 (two) times a day.    . letrozole (FEMARA) 2.5 MG tablet Take 1 tablet (2.5 mg total) by mouth daily. 30 tablet 3  . levothyroxine (SYNTHROID, LEVOTHROID) 100 MCG tablet Take 100 mcg by mouth daily before breakfast.    . lidocaine-prilocaine (EMLA) cream Apply to affected area once 30 g 3  . lisinopril (PRINIVIL,ZESTRIL) 10 MG tablet Take 10 mg by mouth daily.    . Magnesium Carbonate Heavy POWD by Does not apply route. Natural Vitality Calm magnesium supplement    . rosuvastatin (CRESTOR) 5 MG  tablet Take 5 mg by mouth daily.    . traZODone (DESYREL) 50 MG tablet Take 50 mg by mouth at bedtime.    .Marland Kitchenwarfarin (COUMADIN) 5 MG tablet Take 5 mg by mouth daily.     No current facility-administered medications for this visit.    OBJECTIVE: Vitals:   01/06/19 1054  BP: (!) 163/82  Pulse: 70  Temp: (!) 95.7 F (35.4 C)     Body mass index is 33.37 kg/m.    ECOG FS:0 - Asymptomatic  General: Well-developed, well-nourished, no acute distress. Eyes: Pink conjunctiva, anicteric sclera. HEENT: Normocephalic, moist mucous membranes. Breast: Exam deferred today. Lungs: No audible wheezing or coughing. Heart: Regular rate and rhythm. Abdomen: Soft, nontender, no obvious distention. Musculoskeletal: No edema, cyanosis, or clubbing. Neuro: Alert, answering all questions appropriately. Cranial nerves grossly intact. Skin: No rashes or  petechiae noted. Psych: Normal affect.  LAB RESULTS:  Lab Results  Component Value Date   NA 138 01/12/2018   K 4.2 01/12/2018   CL 107 01/12/2018   CO2 24 01/12/2018   GLUCOSE 107 (H) 09/15/2018   BUN 22 01/12/2018   CREATININE 0.88 01/12/2018   CALCIUM 8.9 01/12/2018   PROT 7.2 01/10/2018   ALBUMIN 4.4 01/10/2018   AST 26 01/10/2018   ALT 24 01/10/2018   ALKPHOS 148 (H) 01/10/2018   BILITOT 0.5 01/10/2018   GFRNONAA >60 01/12/2018   GFRAA >60 01/12/2018    Lab Results  Component Value Date   WBC 6.4 04/28/2018   NEUTROABS 4.2 11/14/2017   HGB 12.6 04/28/2018   HCT 37.4 04/28/2018   MCV 88.0 04/28/2018   PLT 192 04/28/2018     STUDIES: No results found.  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  PLAN:    1. 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  Case was also discussed extensively at case conference.  Patient received  neoadjuvant chemotherapy with Adriamycin and Cytoxan.  She only received 4 cycles of weekly Taxol prior to discontinuation of treatment on October 31, 2017 secondary to persistent peripheral neuropathy.  She ultimately required mastectomy and final pathology noted 5 of 6 lymph nodes positive for disease.  Patient completed adjuvant XRT in mid April 2020.  Pain initiated letrozole in May 2020, this was discontinued secondary to side effects and patient was started on anastrozole.  She will complete a minimum of 5 years of treatment in May 2025.  Although will likely extend treatment 7 to 10 years given her high risk disease.  Return to clinic in 6 months for routine evaluation.   2.  Peripheral neuropathy: Chronic and unchanged.  Patient does not complain of this today. 3.  Pulmonary embolus: Patient was diagnosed with a small pulmonary embolus on January 10, 2018.  She is no longer on anticoagulation. 4.  Osteopenia: Bone mineral density from February 26, 2018 reported T score of -1.6.  Continue calcium and vitamin D supplementation.  Repeat in February 2021. 5.  Port: Patient has requested port removal and a referral has been sent to surgery.    Patient expressed understanding and was in agreement with this plan. She also understands that She can call clinic at any time with any questions, concerns, or complaints.   Cancer Staging Breast cancer, stage 2, left ([place]) Staging form: Breast, AJCC 8th Edition - Clinical stage from 07/30/2017: Stage IIA (cT2, cN1, cM0, G2, ER+, PR+, HER2-) - Signed by Lloyd Huger, MD on 07/30/2017   Lloyd Huger, MD   01/06/2019 12:27 PM

## 2019-01-05 ENCOUNTER — Other Ambulatory Visit: Payer: Self-pay

## 2019-01-05 NOTE — Progress Notes (Signed)
Patient pre screened for office appointment, no questions or concerns today. Patient reminded of upcoming appointment time and date. 

## 2019-01-06 ENCOUNTER — Inpatient Hospital Stay (HOSPITAL_BASED_OUTPATIENT_CLINIC_OR_DEPARTMENT_OTHER): Payer: Medicare Other | Admitting: Oncology

## 2019-01-06 ENCOUNTER — Inpatient Hospital Stay: Payer: Medicare Other

## 2019-01-06 ENCOUNTER — Other Ambulatory Visit: Payer: Self-pay

## 2019-01-06 VITALS — BP 163/82 | HR 70 | Temp 95.7°F | Wt 219.5 lb

## 2019-01-06 DIAGNOSIS — Z170 Estrogen receptor positive status [ER+]: Secondary | ICD-10-CM | POA: Diagnosis not present

## 2019-01-06 DIAGNOSIS — Z95828 Presence of other vascular implants and grafts: Secondary | ICD-10-CM

## 2019-01-06 DIAGNOSIS — F17210 Nicotine dependence, cigarettes, uncomplicated: Secondary | ICD-10-CM | POA: Diagnosis not present

## 2019-01-06 DIAGNOSIS — C50412 Malignant neoplasm of upper-outer quadrant of left female breast: Secondary | ICD-10-CM | POA: Diagnosis present

## 2019-01-06 DIAGNOSIS — Z79811 Long term (current) use of aromatase inhibitors: Secondary | ICD-10-CM

## 2019-01-06 DIAGNOSIS — M8580 Other specified disorders of bone density and structure, unspecified site: Secondary | ICD-10-CM | POA: Diagnosis not present

## 2019-01-06 DIAGNOSIS — Z923 Personal history of irradiation: Secondary | ICD-10-CM | POA: Diagnosis not present

## 2019-01-06 DIAGNOSIS — Z853 Personal history of malignant neoplasm of breast: Secondary | ICD-10-CM

## 2019-01-06 DIAGNOSIS — G629 Polyneuropathy, unspecified: Secondary | ICD-10-CM | POA: Diagnosis not present

## 2019-01-06 DIAGNOSIS — C50912 Malignant neoplasm of unspecified site of left female breast: Secondary | ICD-10-CM | POA: Diagnosis not present

## 2019-01-06 DIAGNOSIS — E039 Hypothyroidism, unspecified: Secondary | ICD-10-CM | POA: Diagnosis not present

## 2019-01-06 DIAGNOSIS — I10 Essential (primary) hypertension: Secondary | ICD-10-CM | POA: Diagnosis not present

## 2019-01-06 MED ORDER — HEPARIN SOD (PORK) LOCK FLUSH 100 UNIT/ML IV SOLN
500.0000 [IU] | Freq: Once | INTRAVENOUS | Status: AC
  Administered 2019-01-06: 500 [IU] via INTRAVENOUS
  Filled 2019-01-06: qty 5

## 2019-01-06 MED ORDER — SODIUM CHLORIDE 0.9% FLUSH
10.0000 mL | Freq: Once | INTRAVENOUS | Status: AC
  Administered 2019-01-06: 10 mL via INTRAVENOUS
  Filled 2019-01-06: qty 10

## 2019-01-28 ENCOUNTER — Inpatient Hospital Stay: Payer: Medicare Other | Attending: Oncology | Admitting: Occupational Therapy

## 2019-01-28 ENCOUNTER — Other Ambulatory Visit: Payer: Self-pay

## 2019-01-28 DIAGNOSIS — L905 Scar conditions and fibrosis of skin: Secondary | ICD-10-CM

## 2019-01-28 NOTE — Therapy (Signed)
[facility] [address] [place], [place], [place], [address] Phone: [phone]   Fax:  [phone]  Occupational Therapy Screen  Patient Details  Name: Alicia [name] MRN: [id number] Date of Birth: [birth year] No data recorded  Encounter Date: 01/28/2019  OT End of Session - 01/28/19 1642    Visit Number  0       Past Medical History:  Diagnosis Date  . Anxiety   . Cancer ([place]) 07/2017   left breast  . Depression   . Hypertension   . Hypothyroidism   . Personal history of chemotherapy 2019   LEFT mastectomy-chemo before  . Personal history of radiation therapy 03/2018   LEFT mastectomy  . Rapid heart rate   . Thyroid disease     Past Surgical History:  Procedure Laterality Date  . AXILLARY LYMPH NODE BIOPSY Left 07/16/2017   METASTATIC MAMMARY CARCINOMA  . BREAST BIOPSY Left 07/16/2017   [place] bx of left breast mass and left breast LN.  INVASIVE MAMMARY CARCINOMA, NO SPECIAL TYPE.   Marland Kitchen BREAST EXCISIONAL BIOPSY Right 2001   benign  . BREAST LUMPECTOMY WITH SENTINEL LYMPH NODE BIOPSY Left 12/06/2017   Procedure: BREAST LUMPECTOMY WITH SENTINEL LYMPH NODE BX;  Surgeon: Robert Bellow, MD;  Location: [facility] OR[facility];  Service: General;  Laterality: Left;  . COLONOSCOPY    . MASTECTOMY Left 12/23/2017  . PORTACATH PLACEMENT Right 08/07/2017   Procedure: INSERTION PORT-A-CATH;  Surgeon: Robert Bellow, MD;  Location: [facility] ORS;  Service: General;  Laterality: Right;  . SIMPLE MASTECTOMY WITH AXILLARY SENTINEL NODE BIOPSY Left 12/23/2017   T2,N2 with 6/7 nodes positive. Whole breast radiation.  Surgeon: Robert Bellow, MD;  Location: [facility] ORS;  Service: General;  Laterality: Left;  . TONSILLECTOMY      There were no vitals filed for this visit.  Subjective Assessment - 01/28/19 1642    Subjective   My sleeve feels to tight and had some discomfort in my upper arm          LYMPHEDEMA/ONCOLOGY QUESTIONNAIRE  - 01/28/19 1128      Left Upper Extremity Lymphedema   15 cm Proximal to Olecranon Process  35.5 cm    10 cm Proximal to Olecranon Process  33.5 cm    Olecranon Process  26 cm    15 cm Proximal to Ulnar Styloid Process  24.3 cm    10 cm Proximal to Ulnar Styloid Process  20.3 cm    Just Proximal to Ulnar Styloid Process  15.5 cm    Across Hand at [employer]  19.4 cm         Was seen by Alicia Casa NP andrefered toMaureen [name] due to patient feeling like she is getting lymphedema.   Medical history : [name] a pleasant 69 y.o.female with history of StageIIAleft breastER+/PR+/HER2-, diagnosed inPatient received neoadjuvant chemotherapy with Adriamycin and Cytoxan. She only received 4 cycles of weekly Taxol prior to discontinuation of treatment on October 31, 2017 secondary to persistent peripheral neuropathy. She ultimately required mastectomy and final pathology noted 5 of 6 lymph nodes positive for disease. Patient completed adjuvant XRT in mid April 2020. Pain initiated letrozole in May 2020, this was discontinued secondary to side effects and patient was given a prescription for anastrozole.   1. History of breast cancer:Ms.Holyfieldis currently clinically and radiographically without evidence ofdisease orrecurrence of breast cancer. She will be due for mammogram inAugust 2021. She will continue her anti-estrogen  therapy with Alicia [name], with plans to continue for5-10years. She will return to the cancer center to see her medical oncologist, Dr. Bethanie [name] December 2020.   OT note from 11/26/2018 Pt measurement appear WNL - except mid upperarm - did recommend for her to get over the counter compression sleeve to wear when lifting anything heavy - like water bottles, doing vacuuming or laundry  She can be in stage 1 lymphedema - that it increase only with high risk activities Will measure her in 2-3 wks Again  In meantime - pt scar on L  chest still adhere/tender and limiting her over head AROM in L arm  Scar massage taught and desensitization tech - for massage, soft textures and rougher textures if she can tolerate  Pt to also do AAROM for shoulder flexion and ABD on wall  Keep pain or pull under 2/10  Note:12/10/2018  Pt scar improved greatly - less adhere and tolerating massage, and soft textures much better Pt to focus now on scar mobs with arm over head - and soft and rougher textures - several times during day. pt showed increase flexion and ABD of L shoulder with AAROM on wall  pt can start next week 1 lbs weight for shoulder flexion Y - off wall - if no pain  10 reps  1 x day Pt ed on donning over the counter compression sleeve for high risk activities - her mid upper arm is still increased -and she report feeling full , heavy and tight feeling when using her arm a lot or lifting heavy activities.  Stage 1 lymphedema  Will follow up with me again in 3 wks    NOTE on 12/31/2019:  Pt AROM in L shoulder WNL - very little pull Scar adhesion improved greatly - still adhere at T intersection on lateral chest - if arm is  over head - pt lower her arm as she do the scar massage - pt to do it in supine while doing scar massage in that area to keep arm over head with more ease Pt measurements still increase in the mid upper arm - and sleeves still tight - pt can wear over the counter sleeve more during day for the next 4 wks and will reassess in month  Cont with her same HEP  But add this date scapula squeezes - 4 x day 10 reps  Stage 1 lymphedema in L UE  Follow up in 4 wks   NOTE TODAY: Pt arrive with her compression sleeve on L arm - but not all the ways up Pt report she had some discomfort wearing it - pt ed on how to donn so she can get it all the way up - she had wide band in mid upper arm Felt better with it up all the way Pt to wear only with high risk act - her measurements still same - maintaining  circumference Pt scar on L chest still adhere where 2 scar meet with over head end range shoulder flexion and ABD - pt to rest arm over head when doing her scar massage  And cont with the same  HEP  Pt wants to follow up in another month after doing her scar mobs                               Patient will benefit from skilled therapeutic intervention in order to improve the following deficits and impairments:  Visit Diagnosis: Scar condition and fibrosis of skin    Problem List Patient Active Problem List   Diagnosis Date Noted  . Pulmonary embolism ([place]) 01/11/2018  . Sepsis ([place]) 10/05/2017  . Breast cancer, stage 2, left ([place]) 07/24/2017  . Elevated troponin 02/17/2015    Rosalyn Gess OTR/L,CLT 01/28/2019, 4:43 PM  [facility] [address] [place], [place], [place], [address] Phone: [phone]   Fax:  [phone]  Name: Alicia [name] MRN: [id number] Date of Birth: 1951/01/15

## 2019-02-04 ENCOUNTER — Encounter: Payer: Self-pay | Admitting: *Deleted

## 2019-02-05 DIAGNOSIS — I4891 Unspecified atrial fibrillation: Secondary | ICD-10-CM | POA: Diagnosis not present

## 2019-02-05 DIAGNOSIS — E039 Hypothyroidism, unspecified: Secondary | ICD-10-CM | POA: Diagnosis not present

## 2019-02-05 DIAGNOSIS — I10 Essential (primary) hypertension: Secondary | ICD-10-CM | POA: Diagnosis not present

## 2019-02-25 ENCOUNTER — Inpatient Hospital Stay: Payer: Medicare Other | Admitting: Occupational Therapy

## 2019-02-27 ENCOUNTER — Other Ambulatory Visit: Payer: Self-pay

## 2019-02-27 DIAGNOSIS — C50912 Malignant neoplasm of unspecified site of left female breast: Secondary | ICD-10-CM

## 2019-02-28 NOTE — Progress Notes (Signed)
[facility]  [phone] [phone]  ID: Alicia [name] OB: December 30, 1950  MR#: [id number]  ZTI#:[id number]  Patient Care Team: [name], MD as PCP - General (Internal Medicine) Rico Junker, RN as Registered Nurse Lloyd Huger, MD as Consulting Physician (Oncology) Bary Castilla Forest Gleason, MD as Consulting Physician (General Surgery) Bary Castilla Forest Gleason, MD as Consulting Physician (General Surgery) Noreene Filbert, MD as Referring Physician (Radiation Oncology)   CHIEF COMPLAINT: Clinical stage IIA ER/PR positive, HER-2 negative invasive carcinoma of the upper out quadrant of the left breast.  INTERVAL HISTORY: Patient returns to clinic today for routine 45-monthevaluation.  She no longer complains of hot flashes and only has bilateral knee pain.  She otherwise is tolerating anastrozole well without significant side effects.  She continues to have a mild peripheral neuropathy, but no other neurologic complaints. She denies any recent fevers or illnesses.  She denies any chest pain, shortness of breath, cough, or hemoptysis.  She denies any nausea, vomiting, constipation, or diarrhea.  She has no urinary complaints.  Patient offers no further specific complaints today.    REVIEW OF SYSTEMS:   Review of Systems  Constitutional: Negative.  Negative for fever, malaise/fatigue and weight loss.  Respiratory: Negative.  Negative for cough and shortness of breath.   Cardiovascular: Negative.  Negative for chest pain and leg swelling.  Gastrointestinal: Negative.  Negative for abdominal pain, constipation and nausea.  Genitourinary: Negative.  Negative for dysuria and urgency.  Musculoskeletal: Positive for joint pain. Negative for back pain.  Skin: Negative.  Negative for rash.  Neurological: Positive for tingling and sensory change. Negative for dizziness, focal weakness, weakness and headaches.  Psychiatric/Behavioral: Negative.  The patient  is not nervous/anxious.     As per HPI. Otherwise, a complete review of systems is negative.  PAST MEDICAL HISTORY: Past Medical History:  Diagnosis Date  . Anxiety   . Cancer ([place] 07/2017   left breast  . Depression   . Hypertension   . Hypothyroidism   . Personal history of chemotherapy 2019   LEFT mastectomy-chemo before  . Personal history of radiation therapy 03/2018   LEFT mastectomy  . Rapid heart rate   . Thyroid disease     PAST SURGICAL HISTORY: Past Surgical History:  Procedure Laterality Date  . AXILLARY LYMPH NODE BIOPSY Left 07/16/2017   METASTATIC MAMMARY CARCINOMA  . BREAST BIOPSY Left 07/16/2017   [place] of left breast mass and left breast LN.  INVASIVE MAMMARY CARCINOMA, NO SPECIAL TYPE.   .Marland KitchenBREAST EXCISIONAL BIOPSY Right 2001   benign  . BREAST LUMPECTOMY WITH SENTINEL LYMPH NODE BIOPSY Left 12/06/2017   Procedure: BREAST LUMPECTOMY WITH SENTINEL LYMPH NODE BX;  Surgeon: BRobert Bellow MD;  Location: [facility] OR[facility];  Service: General;  Laterality: Left;  . COLONOSCOPY    . MASTECTOMY Left 12/23/2017  . PORTACATH PLACEMENT Right 08/07/2017   Procedure: INSERTION PORT-A-CATH;  Surgeon: BRobert Bellow MD;  Location: [facility] ORS;  Service: General;  Laterality: Right;  . SIMPLE MASTECTOMY WITH AXILLARY SENTINEL NODE BIOPSY Left 12/23/2017   T2,N2 with 6/7 nodes positive. Whole breast radiation.  Surgeon: BRobert Bellow MD;  Location: [facility] ORS;  Service: General;  Laterality: Left;  . TONSILLECTOMY      FAMILY HISTORY: Family History  Problem Relation Age of Onset  . Stroke Mother   . Thyroid disease Mother   . Renal Disease Mother   . Stroke Father   . Heart attack  Father   . Sudden death Father 36       suicide  . Anuerysm Brother   . Breast cancer Neg Hx     ADVANCED DIRECTIVES (Y/N):  N  HEALTH MAINTENANCE: Social History   Tobacco Use  . Smoking status: Current Some Day Smoker    Packs/day: 0.25    Years: 30.00    Pack years:  7.50    Types: Cigarettes  . Smokeless tobacco: Never Used  Substance Use Topics  . Alcohol use: Yes    Comment: occasional q 42mo  . Drug use: Never     Colonoscopy:  PAP:  Bone density:  Lipid panel:  Allergies  Allergen Reactions  . Sulfa Antibiotics Diarrhea    Current Outpatient Medications  Medication Sig Dispense Refill  . acetaminophen (TYLENOL) 500 MG tablet Take 1,000 mg by mouth every 6 (six) hours as needed for moderate pain or headache.     . albuterol (PROVENTIL HFA;VENTOLIN HFA) 108 (90 Base) MCG/ACT inhaler Inhale 2 puffs into the lungs every 6 (six) hours as needed for wheezing or shortness of breath. 1 Inhaler 2  . ALPRAZolam (XANAX) 0.25 MG tablet Take 0.25 mg by mouth 2 (two) times daily.     .Marland KitchenamLODipine (NORVASC) 5 MG tablet Take 5 mg by mouth at bedtime.     .Marland Kitchenanastrozole (ARIMIDEX) 1 MG tablet Take 1 tablet (1 mg total) by mouth daily. 90 tablet 3  . aspirin EC 81 MG tablet Take 81 mg by mouth at bedtime.     .Marland KitchenbuPROPion (WELLBUTRIN SR) 150 MG 12 hr tablet Take 150 mg by mouth 2 (two) times daily.    . calcium carbonate (OS-CAL - DOSED IN MG OF ELEMENTAL CALCIUM) 1250 (500 Ca) MG tablet Take 1 tablet by mouth daily with breakfast.    . calcium-vitamin D (OSCAL WITH D) 250-125 MG-UNIT tablet Take 1 tablet by mouth 2 (two) times daily.    . Cholecalciferol (VITAMIN D3) 125 MCG (5000 UT) CAPS Take 1 capsule by mouth 2 (two) times a day.    . letrozole (FEMARA) 2.5 MG tablet Take 1 tablet (2.5 mg total) by mouth daily. 30 tablet 3  . levothyroxine (SYNTHROID, LEVOTHROID) 100 MCG tablet Take 100 mcg by mouth daily before breakfast.    . lidocaine-prilocaine (EMLA) cream Apply to affected area once 30 g 3  . lisinopril (PRINIVIL,ZESTRIL) 10 MG tablet Take 10 mg by mouth daily.    . Magnesium Carbonate Heavy POWD by Does not apply route. Natural Vitality Calm magnesium supplement    . rosuvastatin (CRESTOR) 5 MG tablet Take 5 mg by mouth daily.    . traZODone  (DESYREL) 50 MG tablet Take 50 mg by mouth at bedtime.    .Marland Kitchenwarfarin (COUMADIN) 5 MG tablet Take 5 mg by mouth daily.     No current facility-administered medications for this visit.    OBJECTIVE: Vitals:   03/04/19 0945  BP: (!) 149/66  Pulse: 61  Temp: (!) 96.9 F (36.1 C)     Body mass index is 32.75 kg/m.    ECOG FS:0 - Asymptomatic  General: Well-developed, well-nourished, no acute distress. Eyes: Pink conjunctiva, anicteric sclera. HEENT: Normocephalic, moist mucous membranes. Breast: Left mastectomy.  Patient declined exam today. Lungs: No audible wheezing or coughing. Heart: Regular rate and rhythm. Abdomen: Soft, nontender, no obvious distention. Musculoskeletal: No edema, cyanosis, or clubbing. Neuro: Alert, answering all questions appropriately. Cranial nerves grossly intact. Skin: No rashes or petechiae noted.  Psych: Normal affect.   LAB RESULTS:  Lab Results  Component Value Date   NA 138 01/12/2018   K 4.2 01/12/2018   CL 107 01/12/2018   CO2 24 01/12/2018   GLUCOSE 93 03/04/2019   BUN 22 01/12/2018   CREATININE 0.88 01/12/2018   CALCIUM 8.9 01/12/2018   PROT 7.2 01/10/2018   ALBUMIN 4.4 01/10/2018   AST 26 01/10/2018   ALT 24 01/10/2018   ALKPHOS 148 (H) 01/10/2018   BILITOT 0.5 01/10/2018   GFRNONAA >60 01/12/2018   GFRAA >60 01/12/2018    Lab Results  Component Value Date   WBC 6.4 04/28/2018   NEUTROABS 4.2 11/14/2017   HGB 12.6 04/28/2018   HCT 37.4 04/28/2018   MCV 88.0 04/28/2018   PLT 192 04/28/2018     STUDIES: DG Bone Density  Result Date: 03/03/2019 EXAM: DUAL X-RAY ABSORPTIOMETRY (DXA) FOR BONE MINERAL DENSITY IMPRESSION: Your patient Alicia [name] completed a BMD test on 03/03/2019 using the [place] [place] (software version: 14.10) manufactured by [employer]. The following summarizes the results of our evaluation. Technologist: SCE PATIENT BIOGRAPHICAL: Name: Alicia [name], Alicia [name] Patient ID: [id number]  Birth Date: [birth year] Height: 65.0 in. Gender: Female Exam Date: 03/03/2019 Weight: 215.1 lbs. Indications: Breast CA, Caucasian, Height Loss, High Risk Meds, Postmenopausal, Previous Chemo and Radiation Fractures: Treatments: Anastrozole, calcium w/ vit D, Levothyroxine, Synthroid, Vitamin D DENSITOMETRY RESULTS: Site      Region     Measured Date Measured Age WHO Classification Young Adult T-score BMD         %Change vs. Previous Significant Change (*) AP Spine L1-L4 (L3) 03/03/2019 69.7 Osteopenia -1.5 1.002 g/cm2 -1.9% - AP Spine L1-L4 (L3) 02/26/2018 68.7 Osteopenia -1.3 1.021 g/cm2 - - DualFemur Neck Left 03/03/2019 69.7 Osteopenia -1.8 0.785 g/cm2 -3.0% - DualFemur Neck Left 02/26/2018 68.7 Osteopenia -1.6 0.809 g/cm2 - - DualFemur Total Mean 03/03/2019 69.7 Osteopenia -1.5 0.821 g/cm2 -3.3% - DualFemur Total Mean 02/26/2018 68.7 Osteopenia -1.3 0.849 g/cm2 - - ASSESSMENT: The BMD measured at Femur Neck Left is 0.785 g/cm2 with a T-score of -1.8. This patient is considered osteopenic according to [facility]) criteria. The scan quality is good. L3 was excluded due to degenerative changes. Compared with prior study, there has been no significant change in the spine. Compared with prior study, there has been no significant change in the total hip. World Pharmacologist [facility]) criteria for post-menopausal, Caucasian Women: Normal:       T-score at or above -1 SD Osteopenia:   T-score between -1 and -2.5 SD Osteoporosis: T-score at or below -2.5 SD RECOMMENDATIONS: 1. All patients should optimize calcium and vitamin D intake. 2. Consider FDA-approved medical therapies in postmenopausal women and men aged 65 years and older, based on the following: a. A hip or vertebral(clinical or morphometric) fracture b. T-score < -2.5 at the femoral neck or spine after appropriate evaluation to exclude secondary causes c. Low bone mass (T-score between -1.0 and -2.5 at the femoral neck or spine) and a  10-year probability of a hip fracture > 3% or a 10-year probability of a major osteoporosis-related fracture > 20% based on the [place]-adapted WHO algorithm 3. Clinician judgment and/or patient preferences may indicate treatment for people with 10-year fracture probabilities above or below these levels FOLLOW-UP: People with diagnosed cases of osteoporosis or at high risk for fracture should have regular bone mineral density tests. For patients eligible for Medicare, routine testing is allowed once every 2  years. The testing frequency can be increased to one year for patients who have rapidly progressing disease, those who are receiving or discontinuing medical therapy to restore bone mass, or have additional risk factors. I have reviewed this report, and agree with the above findings. [facility] Dear Dr Grayland Ormond, Your patient Alicia [name] completed a FRAX assessment on 03/03/2019 using the [place] (analysis version: 14.10) manufactured by [employer]. The following summarizes the results of our evaluation. PATIENT BIOGRAPHICAL: Name: Alicia [name], Alicia [name] Patient ID: [id number] Birth Date: Feb 16, 1950 Height:    65.0 in. Gender:     Female    [age]       Weight:    215.1 lbs. Ethnicity:  White                            Exam Date: 03/03/2019 FRAX* RESULTS:  (version: 3.5) 10-year Probability of Fracture1 Major Osteoporotic Fracture2 Hip Fracture 10.1% 1.7% Population: [place] (Caucasian) Risk Factors: None Based on Femur (Left) Neck BMD 1 -The 10-year probability of fracture may be lower than reported if the patient has received treatment. 2 -Major Osteoporotic Fracture: Clinical Spine, Forearm, Hip or Shoulder *FRAX is a Materials engineer of the [employer] for Metabolic Bone Disease, a [place] (WHO) [facility]. ASSESSMENT: The probability of a major osteoporotic fracture is 10.1% within the next ten years. The probability  of a hip fracture is 1.7% within the next ten years. . Electronically Signed   By: Lowella Grip III M.D.   On: 03/03/2019 10:57    ASSESSMENT: Clinical stage IIA ER/PR positive, HER-2 negative invasive carcinoma of the upper out quadrant of the left breast.  PLAN:    1. 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  Case was also discussed extensively at case conference.  Patient received neoadjuvant chemotherapy with Adriamycin and Cytoxan.  She only received 4 cycles of weekly Taxol prior to discontinuation of treatment on October 31, 2017 secondary to persistent peripheral neuropathy.  She ultimately required mastectomy and final pathology noted 5 of 6 lymph nodes positive for disease.  Patient completed adjuvant XRT in mid April 2020.  Pain initiated letrozole in May 2020, this was discontinued secondary to side effects and patient was started on anastrozole.  Continue anastrozole for a minimum of 5 years of treatment completing in May 2025.  Given her high risk disease, will likely extend treatment for 7 to 10 years.  Patient will require a right screening mammogram in August 2021.  Return to clinic 1 to 2 days after her mammogram for further evaluation. 2.  Peripheral neuropathy: Chronic and unchanged. 3.  Pulmonary embolus: Patient was diagnosed with a small pulmonary embolus on January 10, 2018.  She is no longer on anticoagulation. 4.  Osteopenia: Patient's most recent bone mineral density on March 03, 2019 reported a T score of -1.8 which is only mildly decreased from 1 year prior when the reported T score was -1.6.  Continue calcium and vitamin D supplementation.  Repeat in February 2022.      Patient expressed understanding and was in agreement with this plan. She also understands that She can call clinic at any time with any questions, concerns, or complaints.    Cancer Staging Breast cancer, stage 2, left ([place])  Staging form: Breast, AJCC 8th Edition - Clinical stage from 07/30/2017: Stage IIA (cT2, cN1, cM0, G2, ER+, PR+, HER2-) - Signed by Lloyd Huger, MD on 07/30/2017   Lloyd Huger, MD   03/05/2019 6:37 AM

## 2019-03-03 ENCOUNTER — Ambulatory Visit
Admission: RE | Admit: 2019-03-03 | Discharge: 2019-03-03 | Disposition: A | Discharge status: Home / Self Care | Payer: Medicare Other | Source: Ambulatory Visit | Attending: Oncology | Admitting: Oncology

## 2019-03-03 ENCOUNTER — Telehealth: Payer: Self-pay

## 2019-03-03 DIAGNOSIS — Z789 Other specified health status: Secondary | ICD-10-CM | POA: Diagnosis not present

## 2019-03-03 DIAGNOSIS — F410 Panic disorder [episodic paroxysmal anxiety] without agoraphobia: Secondary | ICD-10-CM | POA: Diagnosis not present

## 2019-03-03 DIAGNOSIS — Z853 Personal history of malignant neoplasm of breast: Secondary | ICD-10-CM | POA: Diagnosis not present

## 2019-03-03 DIAGNOSIS — M8589 Other specified disorders of bone density and structure, multiple sites: Secondary | ICD-10-CM | POA: Diagnosis not present

## 2019-03-03 DIAGNOSIS — C50919 Malignant neoplasm of unspecified site of unspecified female breast: Secondary | ICD-10-CM

## 2019-03-03 DIAGNOSIS — Z79811 Long term (current) use of aromatase inhibitors: Secondary | ICD-10-CM | POA: Diagnosis not present

## 2019-03-03 DIAGNOSIS — I10 Essential (primary) hypertension: Secondary | ICD-10-CM | POA: Diagnosis not present

## 2019-03-03 DIAGNOSIS — M810 Age-related osteoporosis without current pathological fracture: Secondary | ICD-10-CM | POA: Diagnosis not present

## 2019-03-03 NOTE — Telephone Encounter (Signed)
Called patient to remind her that she needs to be fasting at least 6 hours prior to her scheduled lab appointment for the BWEL 6 month follow up visit in the morning. Message left on a secure voice mail system. Jeral Fruit, RN, BSN, OCN 03/03/2019 1320 pm

## 2019-03-04 ENCOUNTER — Inpatient Hospital Stay: Payer: Medicare Other | Admitting: Occupational Therapy

## 2019-03-04 ENCOUNTER — Encounter: Payer: Self-pay | Admitting: Oncology

## 2019-03-04 ENCOUNTER — Inpatient Hospital Stay: Payer: Medicare Other

## 2019-03-04 ENCOUNTER — Other Ambulatory Visit: Payer: Self-pay

## 2019-03-04 ENCOUNTER — Inpatient Hospital Stay: Payer: Medicare Other | Attending: Oncology | Admitting: Oncology

## 2019-03-04 ENCOUNTER — Encounter: Payer: Self-pay | Admitting: *Deleted

## 2019-03-04 VITALS — BP 149/66 | HR 61 | Temp 96.9°F | Wt 215.4 lb

## 2019-03-04 DIAGNOSIS — Z006 Encounter for examination for normal comparison and control in clinical research program: Secondary | ICD-10-CM | POA: Diagnosis not present

## 2019-03-04 DIAGNOSIS — F17210 Nicotine dependence, cigarettes, uncomplicated: Secondary | ICD-10-CM | POA: Diagnosis not present

## 2019-03-04 DIAGNOSIS — Z923 Personal history of irradiation: Secondary | ICD-10-CM | POA: Diagnosis not present

## 2019-03-04 DIAGNOSIS — L905 Scar conditions and fibrosis of skin: Secondary | ICD-10-CM

## 2019-03-04 DIAGNOSIS — Z9221 Personal history of antineoplastic chemotherapy: Secondary | ICD-10-CM | POA: Diagnosis not present

## 2019-03-04 DIAGNOSIS — C50412 Malignant neoplasm of upper-outer quadrant of left female breast: Secondary | ICD-10-CM | POA: Insufficient documentation

## 2019-03-04 DIAGNOSIS — Z86711 Personal history of pulmonary embolism: Secondary | ICD-10-CM | POA: Insufficient documentation

## 2019-03-04 DIAGNOSIS — G620 Drug-induced polyneuropathy: Secondary | ICD-10-CM | POA: Insufficient documentation

## 2019-03-04 DIAGNOSIS — M8580 Other specified disorders of bone density and structure, unspecified site: Secondary | ICD-10-CM | POA: Insufficient documentation

## 2019-03-04 DIAGNOSIS — T451X5A Adverse effect of antineoplastic and immunosuppressive drugs, initial encounter: Secondary | ICD-10-CM | POA: Insufficient documentation

## 2019-03-04 DIAGNOSIS — C50912 Malignant neoplasm of unspecified site of left female breast: Secondary | ICD-10-CM

## 2019-03-04 DIAGNOSIS — Z170 Estrogen receptor positive status [ER+]: Secondary | ICD-10-CM | POA: Diagnosis not present

## 2019-03-04 DIAGNOSIS — Z1231 Encounter for screening mammogram for malignant neoplasm of breast: Secondary | ICD-10-CM

## 2019-03-04 DIAGNOSIS — Z9012 Acquired absence of left breast and nipple: Secondary | ICD-10-CM | POA: Diagnosis not present

## 2019-03-04 DIAGNOSIS — Z79811 Long term (current) use of aromatase inhibitors: Secondary | ICD-10-CM | POA: Insufficient documentation

## 2019-03-04 DIAGNOSIS — C50919 Malignant neoplasm of unspecified site of unspecified female breast: Secondary | ICD-10-CM

## 2019-03-04 LAB — GLUCOSE, RANDOM: Glucose, Bld: 93 mg/dL (ref 70–99)

## 2019-03-04 NOTE — Research (Signed)
Patient in to the cancer center today for her 6 month study visit on the [place] [id number] Protocol. She was randomized to Arm 1 of the protocol on August 27,2020 - with preventive weight loss education for two years and optional blood and tissue submission for genomix. Ms. Kanaan had Central study labs and fasting glucose collected this morning and verified that she had been fasting since 7:00-8:00pm last night. States she is doing better other than her knees are still causing her some pain due to Anastrozole, but states it is much more tolerable since Dr. Grayland Ormond changed her from the Letrozole. This joint pain in her knees is grade 1 and does not limit her ability to perform ADLs. Patient denies experiencing any of the study solicited AEs including fractures, sprains, tendon/ligament injuries or orthopedic surgeries. She reports that she has received her diet/nutrition magazines from the study and has been invited to participate in video education exercises, but is unable to due to internet connectivity issues at her home. Ms. Colao states she has not been receiving coaching calls as expected, but later reports that she may have been called, but did not answer if she did not recognize the caller's phone number. Informed that we will contact the study and try to get a phone number from which the call is being made so she can recognize it in the future. Explained to patient that she will be required to sign a re-consent in order to continue on the study due to updated information. Reviewed the changes in the study consent including the request for retroactive submission of CT/PET scans if available. The patient agreed to allow submission of these scans and she provided written consent to the [id number] ICF with Protocol Version Date: 08/22/18 and corresponding updated [facility] HIPAA form. Patient was provided with copies of the signed documents for her records with new contact information for myself and Jeral Fruit  in the event she has questions. Patient's weight was checked x 2 with all excess clothing, jewelry and shoes removed. Patient weighed 97.7kg and 97.75 kg respectively. Her waist measured 102 cm x 2 at the narrowest point between the lower rib margin and the iliac crest. Her hips were measured at the maximum circumference over the buttocks at 131 cm initially and 129.5cm the second time. Patient completed the [id number] study follow up questionnaire while in clinic. Dr. Grayland Ormond was in to see patient for H&P, AE assessment and to make sure she had her next mammogram scheduled. He reviewed her bone scan from yesterday and advised patient to continue to take calcium + vitamin D supplements. Patient returns to clinic for next assessment on 08/27/2019 and will have her mammogram on 08/24/2019.  Solicited Adverse Event Log  Study/Protocol: [id number] BWEL Cycle: 0-6 months Event Grade Onset Date Resolved Date Drug Name Attribution Treatment Comments  Fractures 0        Sprains 0        Tendon or ligament injuries 0        Orthopedic surgery 0        Arthralgia: Joint pain in bilateral knees 1   Letrozole definite  AI changed to Anastrozole  Yolande Jolly, BSN, MHA, OCN 03/04/2019 10:51 AM

## 2019-03-04 NOTE — Therapy (Signed)
[facility] [address] [place], [place], [place], [address] Phone: [phone]   Fax:  [phone]  Occupational Therapy Treatment  Patient Details  Name: Alicia [name] MRN: [id number] Date of Birth: [birth year] No data recorded  Encounter Date: 03/04/2019  OT End of Session - 03/04/19 1229    Visit Number  0       Past Medical History:  Diagnosis Date  . Anxiety   . Cancer ([place]) 07/2017   left breast  . Depression   . Hypertension   . Hypothyroidism   . Personal history of chemotherapy 2019   LEFT mastectomy-chemo before  . Personal history of radiation therapy 03/2018   LEFT mastectomy  . Rapid heart rate   . Thyroid disease     Past Surgical History:  Procedure Laterality Date  . AXILLARY LYMPH NODE BIOPSY Left 07/16/2017   METASTATIC MAMMARY CARCINOMA  . BREAST BIOPSY Left 07/16/2017   [place] bx of left breast mass and left breast LN.  INVASIVE MAMMARY CARCINOMA, NO SPECIAL TYPE.   Marland Kitchen BREAST EXCISIONAL BIOPSY Right 2001   benign  . BREAST LUMPECTOMY WITH SENTINEL LYMPH NODE BIOPSY Left 12/06/2017   Procedure: BREAST LUMPECTOMY WITH SENTINEL LYMPH NODE BX;  Surgeon: Robert Bellow, MD;  Location: [facility] ORS;  Service: General;  Laterality: Left;  . COLONOSCOPY    . MASTECTOMY Left 12/23/2017  . PORTACATH PLACEMENT Right 08/07/2017   Procedure: INSERTION PORT-A-CATH;  Surgeon: Robert Bellow, MD;  Location: [facility] ORS;  Service: General;  Laterality: Right;  . SIMPLE MASTECTOMY WITH AXILLARY SENTINEL NODE BIOPSY Left 12/23/2017   T2,N2 with 6/7 nodes positive. Whole breast radiation.  Surgeon: Robert Bellow, MD;  Location: [facility] ORS;  Service: General;  Laterality: Left;  . TONSILLECTOMY      There were no vitals filed for this visit.  Subjective Assessment - 03/04/19 1228    Subjective   Doing much better - only wearing sleeve when using my arm a lot -and feeling better to wear it - my bra bother  this one area if I wear this light pad in bra          LYMPHEDEMA/ONCOLOGY QUESTIONNAIRE - 03/04/19 1030      Left Upper Extremity Lymphedema   15 cm Proximal to Olecranon Process  34.5 cm    10 cm Proximal to Olecranon Process  32.4 cm    Olecranon Process  26 cm    15 cm Proximal to Ulnar Styloid Process  24.4 cm    10 cm Proximal to Ulnar Styloid Process  20.6 cm    Just Proximal to Ulnar Styloid Process  15.5 cm         Was seen by Faythe Casa NP andrefered toMaureen Dupree due to patient feeling like she is getting lymphedema.   Medical history : [name] a pleasant 69 y.o.female with history of StageIIAleft breastER+/PR+/HER2-, diagnosed inPatient received neoadjuvant chemotherapy with Adriamycin and Cytoxan. She only received 4 cycles of weekly Taxol prior to discontinuation of treatment on October 31, 2017 secondary to persistent peripheral neuropathy. She ultimately required mastectomy and final pathology noted 5 of 6 lymph nodes positive for disease. Patient completed adjuvant XRT in mid April 2020. Pain initiated letrozole in May 2020, this was discontinued secondary to side effects and patient was given a prescription for anastrozole.   1. History of breast cancer:Ms.Holyfieldis currently clinically and radiographically without evidence ofdisease orrecurrence of breast cancer. She will be due  for mammogram inAugust 2021. She will continue her anti-estrogen therapy with Armidex, with plans to continue for5-10years. She will return to the cancer center to see her medical oncologist, Dr. Bethanie Dicker December 2020.   OT note from 11/26/2018 Pt measurement appear WNL - except mid upperarm - did recommend for her to get over the counter compression sleeve to wear when lifting anything heavy - like water bottles, doing vacuuming or laundry  She can be in stage 1 lymphedema - that it increase only with high risk activities Will measure  her in 2-3 wks Again  In meantime - pt scar on L chest still adhere/tender and limiting her over head AROM in L arm  Scar massage taught and desensitization tech - for massage, soft textures and rougher textures if she can tolerate  Pt to also do AAROM for shoulder flexion and ABD on wall  Keep pain or pull under 2/10  Note:12/10/2018 Pt scar improved greatly - less adhere and tolerating massage, and soft textures much better Pt to focus now on scar mobs with arm over head - and soft and rougher textures - several times during day. pt showed increase flexion and ABD of L shoulder with AAROM on wall  pt can start next week 1 lbs weight for shoulder flexion Y - off wall - if no pain  10 reps  1 x day Pt ed on donning over the counter compression sleeve for high risk activities - her mid upper arm is still increased -and she report feeling full , heavy and tight feeling when using her arm a lot or lifting heavy activities.  Stage 1 lymphedema  Will follow up with me again in 3 wks    NOTE on 12/31/2019:  Pt AROM in L shoulder WNL - very little pull Scar adhesion improved greatly - still adhere at T intersection on lateral chest - if arm is over head - pt lower her arm as she do the scar massage - pt to do it in supine while doing scar massage in that area to keep arm over head with more ease Pt measurements still increase in the mid upper arm - and sleeves still tight - pt can wear over the counter sleeve more during day for the next 4 wks and will reassess in month  Cont with her same HEP  But add this date scapula squeezes - 4 x day 10 reps  Stage 1 lymphedema in L UE  Follow up in 4 wks  NOTE 01/28/2019: Pt arrive with her compression sleeve on L arm - but not all the ways up Pt report she had some discomfort wearing it - pt ed on how to donn so she can get it all the way up - she had wide band in mid upper arm Felt better with it up all the way Pt to wear only with high  risk act - her measurements still same - maintaining circumference Pt scar on L chest still adhere where 2 scar meet with over head end range shoulder flexion and ABD - pt to rest arm over head when doing her scar massage  And cont with the same  HEP  Pt wants to follow up in another month after doing her scar mobs   NOTE FOR TODAY: Pt report compression sleeve doing well - no issues and wearing with high risk activities And scar adhesion improving - moving much better-  Pt to cont another 3 months to loosen scar adhesion to decrease pull  on anterior tissue at shoulder - bothering her bra if it "rides" up  Do not bother her if she wear the heavier prosthesis Circumference decrease in upper arm on L -and in range with R UE now - no further OT follow up needed at this time - pt to contact me if needed  But do need to replace her compression sleeve in 6-8 months                                     Visit Diagnosis: Scar condition and fibrosis of skin    Problem List Patient Active Problem List   Diagnosis Date Noted  . Pulmonary embolism ([place]) 01/11/2018  . Sepsis ([place]) 10/05/2017  . Breast cancer, stage 2, left ([place]) 07/24/2017  . Elevated troponin 02/17/2015    Rosalyn Gess OTR/l,CLT 03/04/2019, 12:30 PM  [facility] [address] [place], [place], [place], [address] Phone: [phone]   Fax:  [phone]  Name: Alicia [name] MRN: [id number] Date of Birth: Aug 24, 1950

## 2019-03-31 DIAGNOSIS — Z789 Other specified health status: Secondary | ICD-10-CM | POA: Diagnosis not present

## 2019-03-31 DIAGNOSIS — E039 Hypothyroidism, unspecified: Secondary | ICD-10-CM | POA: Diagnosis not present

## 2019-03-31 DIAGNOSIS — E669 Obesity, unspecified: Secondary | ICD-10-CM | POA: Diagnosis not present

## 2019-05-11 DIAGNOSIS — Z789 Other specified health status: Secondary | ICD-10-CM | POA: Diagnosis not present

## 2019-05-11 DIAGNOSIS — I10 Essential (primary) hypertension: Secondary | ICD-10-CM | POA: Diagnosis not present

## 2019-05-11 DIAGNOSIS — M25559 Pain in unspecified hip: Secondary | ICD-10-CM | POA: Diagnosis not present

## 2019-05-12 DIAGNOSIS — Z23 Encounter for immunization: Secondary | ICD-10-CM | POA: Diagnosis not present

## 2019-05-18 ENCOUNTER — Ambulatory Visit: Payer: Medicare Other | Admitting: Radiation Oncology

## 2019-05-25 ENCOUNTER — Ambulatory Visit: Payer: Medicare Other | Admitting: Radiation Oncology

## 2019-05-29 ENCOUNTER — Other Ambulatory Visit: Payer: Self-pay | Admitting: Internal Medicine

## 2019-06-02 DIAGNOSIS — Z23 Encounter for immunization: Secondary | ICD-10-CM | POA: Diagnosis not present

## 2019-06-11 ENCOUNTER — Other Ambulatory Visit
Admission: RE | Admit: 2019-06-11 | Discharge: 2019-06-11 | Disposition: A | Discharge status: Home / Self Care | Payer: Medicare Other | Source: Ambulatory Visit | Attending: Radiation Oncology | Admitting: Radiation Oncology

## 2019-06-11 ENCOUNTER — Other Ambulatory Visit: Payer: Self-pay

## 2019-06-11 ENCOUNTER — Ambulatory Visit
Admission: RE | Admit: 2019-06-11 | Discharge: 2019-06-11 | Disposition: A | Discharge status: Home / Self Care | Payer: Medicare Other | Source: Ambulatory Visit | Attending: Radiation Oncology | Admitting: Radiation Oncology

## 2019-06-11 ENCOUNTER — Ambulatory Visit: Payer: Medicare Other | Admitting: Internal Medicine

## 2019-06-11 ENCOUNTER — Encounter: Payer: Self-pay | Admitting: Radiation Oncology

## 2019-06-11 ENCOUNTER — Other Ambulatory Visit: Payer: Self-pay | Admitting: Radiation Oncology

## 2019-06-11 ENCOUNTER — Other Ambulatory Visit: Payer: Self-pay | Admitting: *Deleted

## 2019-06-11 VITALS — BP 120/71 | HR 71 | Temp 96.8°F | Resp 16 | Wt 216.0 lb

## 2019-06-11 DIAGNOSIS — Z79811 Long term (current) use of aromatase inhibitors: Secondary | ICD-10-CM | POA: Diagnosis not present

## 2019-06-11 DIAGNOSIS — C50912 Malignant neoplasm of unspecified site of left female breast: Secondary | ICD-10-CM

## 2019-06-11 DIAGNOSIS — M25552 Pain in left hip: Secondary | ICD-10-CM

## 2019-06-11 DIAGNOSIS — Z170 Estrogen receptor positive status [ER+]: Secondary | ICD-10-CM | POA: Insufficient documentation

## 2019-06-11 DIAGNOSIS — Z923 Personal history of irradiation: Secondary | ICD-10-CM | POA: Insufficient documentation

## 2019-06-11 DIAGNOSIS — C50412 Malignant neoplasm of upper-outer quadrant of left female breast: Secondary | ICD-10-CM | POA: Insufficient documentation

## 2019-06-11 NOTE — Progress Notes (Signed)
Radiation Oncology Follow up Note  Name: Alicia [name]   Date:   06/11/2019 MRN:  [id number] DOB: 12/27/1950    This 69 y.o. female presents to the clinic today for 1 year follow-up status post left chest wall and peripheral emphatic radiation therapy for stage IIb ER/PR positive invasive mammary carcinoma.  REFERRING PROVIDER: [name], MD  HPI: Patient is a 69-year-old female now at 1 year having completed radiation therapy to her left chest wall and peripheral lymphatics for a stage IIb ER/PR positive invasive mammary carcinoma status post neoadjuvant chemotherapy. Seen today in routine follow-up she is doing fairly well she is had a cough although she states she has allergies. DC is seem to be seasonal in nature and may be related to some sinusitis. She also has been mowing her yard which increases her cough and I have suggested wearing a facemask during that time.. Patient is also having some left hip pain of unknown etiology. Patient had a right breast mammogram back in August 2020 which was negative. Patient is currently on Arimidex tolerating it well without side effect  COMPLICATIONS OF TREATMENT: none  FOLLOW UP COMPLIANCE: keeps appointments   PHYSICAL EXAM:  BP 120/71 (BP Location: Right Arm, Patient Position: Sitting, Cuff Size: Large)   Pulse 71   Temp (!) 96.8 F (36 C) (Tympanic)   Resp 16   Wt 216 lb (98 kg)   BMI 32.84 kg/m  Patient status post left modified radical mastectomy chest wall is well-healed without evidence of nodularity or mass. Right breast is free of dominant mass or nodularity. No axillary or supraclavicular adenopathy is identified. No evidence of lymphedema in her left upper extremity is noted. Well-developed well-nourished patient in NAD. HEENT reveals PERLA, EOMI, discs not visualized.  Oral cavity is clear. No oral mucosal lesions are identified. Neck is clear without evidence of cervical or supraclavicular adenopathy. Lungs are clear to A&P.  Cardiac examination is essentially unremarkable with regular rate and rhythm without murmur rub or thrill. Abdomen is benign with no organomegaly or masses noted. Motor sensory and DTR levels are equal and symmetric in the upper and lower extremities. Cranial nerves II through XII are grossly intact. Proprioception is intact. No peripheral adenopathy or edema is identified. No motor or sensory levels are noted. Crude visual fields are within normal range.  RADIOLOGY RESULTS: I have ordered a plain films of her left hip rule out possible metastatic disease  PLAN: Present time patient is doing well will get a plain film of her left hip just to rule out slight possibility of metastatic disease. Otherwise I have asked her to possibly referred for orthopedic surgical consultation. I have asked to see the patient back in 1 year for follow-up. She continues Arimidex without side effect. Patient knows to call with any concerns.  I would like to take this opportunity to thank you for allowing me to participate in the care of your patient.Noreene Filbert, MD

## 2019-06-12 ENCOUNTER — Other Ambulatory Visit: Payer: Self-pay | Admitting: *Deleted

## 2019-06-12 DIAGNOSIS — C7951 Secondary malignant neoplasm of bone: Secondary | ICD-10-CM

## 2019-06-18 ENCOUNTER — Other Ambulatory Visit: Payer: Self-pay

## 2019-06-18 ENCOUNTER — Ambulatory Visit (INDEPENDENT_AMBULATORY_CARE_PROVIDER_SITE_OTHER): Payer: Medicare Other | Admitting: Internal Medicine

## 2019-06-18 ENCOUNTER — Encounter: Payer: Self-pay | Admitting: Internal Medicine

## 2019-06-18 VITALS — BP 141/75 | HR 82 | Wt 216.4 lb

## 2019-06-18 DIAGNOSIS — C50912 Malignant neoplasm of unspecified site of left female breast: Secondary | ICD-10-CM | POA: Diagnosis not present

## 2019-06-18 DIAGNOSIS — R000 Tachycardia, unspecified: Secondary | ICD-10-CM | POA: Diagnosis not present

## 2019-06-18 DIAGNOSIS — F419 Anxiety disorder, unspecified: Secondary | ICD-10-CM | POA: Diagnosis not present

## 2019-06-18 MED ORDER — ALPRAZOLAM 0.25 MG PO TABS: 0.2500 mg | ORAL_TABLET | Freq: Two times a day (BID) | ORAL | 2 refills | Status: DC

## 2019-06-18 NOTE — Assessment & Plan Note (Signed)
Oncology referral

## 2019-06-18 NOTE — Progress Notes (Addendum)
Patient ID: Alicia [name], female   DOB: [birth year], 69 y.o.   MRN: [id number]    Established Patient Office Visit  Subjective:  Patient ID: Alicia [name], female    DOB: [birth year]  Age: 69 y.o. MRN: [id number]  CC:  Chief Complaint  Patient presents with  . Anxiety    Patient is here today for her medication refill of xanax    HPI  Alicia [name] presents for a refill of her Xanax which she takes for anxiety. She also reports some left anterior hip pain that comes and goes and worsened last week. She had a hip x ray on 06/11/2019 which revealed "Large lytic lesions of the anterior left ilium... highly concerning for osseous metastatic disease given history of breast malignancy." She has a full body bone scan scheduled for tomorrow. Her blood pressure has been running high recently because of this stress. She has also been feeling slightly depressed. She has a hx of left breast cancer/mastectomy and hypertension.  Past Medical History:  Diagnosis Date  . Anxiety   . Cancer ([place]) 07/2017   left breast  . Depression   . Hypertension   . Hypothyroidism   . Personal history of chemotherapy 2019   LEFT mastectomy-chemo before  . Personal history of radiation therapy 03/2018   LEFT mastectomy  . Rapid heart rate   . Thyroid disease     Past Surgical History:  Procedure Laterality Date  . AXILLARY LYMPH NODE BIOPSY Left 07/16/2017   METASTATIC MAMMARY CARCINOMA  . BREAST BIOPSY Left 07/16/2017   [place] bx of left breast mass and left breast LN.  INVASIVE MAMMARY CARCINOMA, NO SPECIAL TYPE.   Marland Kitchen BREAST EXCISIONAL BIOPSY Right 2001   benign  . BREAST LUMPECTOMY WITH SENTINEL LYMPH NODE BIOPSY Left 12/06/2017   Procedure: BREAST LUMPECTOMY WITH SENTINEL LYMPH NODE BX;  Surgeon: Robert Bellow, MD;  Location: [facility] ORS;  Service: General;  Laterality: Left;  . COLONOSCOPY    . MASTECTOMY Left 12/23/2017  . PORTACATH PLACEMENT Right 08/07/2017   Procedure: INSERTION  PORT-A-CATH;  Surgeon: Robert Bellow, MD;  Location: [facility] ORS;  Service: General;  Laterality: Right;  . SIMPLE MASTECTOMY WITH AXILLARY SENTINEL NODE BIOPSY Left 12/23/2017   T2,N2 with 6/7 nodes positive. Whole breast radiation.  Surgeon: Robert Bellow, MD;  Location: [facility] ORS;  Service: General;  Laterality: Left;  . TONSILLECTOMY      Family History  Problem Relation Age of Onset  . Stroke Mother   . Thyroid disease Mother   . Renal Disease Mother   . Stroke Father   . Heart attack Father   . Sudden death Father 49       suicide  . Anuerysm Brother   . Breast cancer Neg Hx     Social History   Socioeconomic History  . Marital status: Widowed    Spouse name: Not on file  . Number of children: Not on file  . Years of education: Not on file  . Highest education level: Not on file  Occupational History  . Not on file  Tobacco Use  . Smoking status: Current Some Day Smoker    Packs/day: 0.25    Years: 30.00    Pack years: 7.50    Types: Cigarettes  . Smokeless tobacco: Never Used  Substance and Sexual Activity  . Alcohol use: Yes    Comment: occasional q 18mo   . Drug use: Never  . Sexual activity: Not Currently  Other Topics Concern  . Not on file  Social History Narrative  . Not on file   Social Determinants of Health   Financial Resource Strain:   . Difficulty of Paying Living Expenses:   Food Insecurity:   . Worried About Charity fundraiser in the Last Year:   . Arboriculturist in the Last Year:   Transportation Needs:   . Film/video editor (Medical):   Marland Kitchen Lack of Transportation (Non-Medical):   Physical Activity:   . Days of Exercise per Week:   . Minutes of Exercise per Session:   Stress:   . Feeling of Stress :   Social Connections:   . Frequency of Communication with Friends and Family:   . Frequency of Social Gatherings with Friends and Family:   . Attends Religious Services:   . Active Member of Clubs or Organizations:   .  Attends Archivist Meetings:   Marland Kitchen Marital Status:   Intimate Partner Violence:   . Fear of Current or Ex-Partner:   . Emotionally Abused:   Marland Kitchen Physically Abused:   . Sexually Abused:      Current Outpatient Medications:  .  acetaminophen (TYLENOL) 500 MG tablet, Take 1,000 mg by mouth every 6 (six) hours as needed for moderate pain or headache. , Disp: , Rfl:  .  albuterol (PROVENTIL HFA;VENTOLIN HFA) 108 (90 Base) MCG/ACT inhaler, Inhale 2 puffs into the lungs every 6 (six) hours as needed for wheezing or shortness of breath., Disp: 1 Inhaler, Rfl: 2 .  ALPRAZolam (XANAX) 0.25 MG tablet, Take 1 tablet (0.25 mg total) by mouth 2 (two) times daily., Disp: 30 tablet, Rfl: 2 .  amLODipine (NORVASC) 5 MG tablet, Take 5 mg by mouth at bedtime. , Disp: , Rfl:  .  anastrozole (ARIMIDEX) 1 MG tablet, Take 1 tablet (1 mg total) by mouth daily., Disp: 90 tablet, Rfl: 3 .  aspirin EC 81 MG tablet, Take 81 mg by mouth at bedtime. , Disp: , Rfl:  .  buPROPion (WELLBUTRIN SR) 150 MG 12 hr tablet, Take 150 mg by mouth 2 (two) times daily., Disp: , Rfl:  .  calcium carbonate (OS-CAL - DOSED IN MG OF ELEMENTAL CALCIUM) 1250 (500 Ca) MG tablet, Take 1 tablet by mouth daily with breakfast., Disp: , Rfl:  .  calcium-vitamin D (OSCAL WITH D) 250-125 MG-UNIT tablet, Take 1 tablet by mouth 2 (two) times daily., Disp: , Rfl:  .  Cholecalciferol (VITAMIN D3) 125 MCG (5000 UT) CAPS, Take 1 capsule by mouth 2 (two) times a day., Disp: , Rfl:  .  letrozole (FEMARA) 2.5 MG tablet, Take 1 tablet (2.5 mg total) by mouth daily., Disp: 30 tablet, Rfl: 3 .  levothyroxine (SYNTHROID, LEVOTHROID) 100 MCG tablet, Take 100 mcg by mouth daily before breakfast., Disp: , Rfl:  .  lidocaine-prilocaine (EMLA) cream, Apply to affected area once, Disp: 30 g, Rfl: 3 .  lisinopril (PRINIVIL,ZESTRIL) 10 MG tablet, Take 10 mg by mouth daily., Disp: , Rfl:  .  losartan-hydrochlorothiazide (HYZAAR) 100-12.5 MG tablet, Take 1 tablet  by mouth daily., Disp: , Rfl:  .  Magnesium Carbonate Heavy POWD, by Does not apply route. Natural Vitality Calm magnesium supplement, Disp: , Rfl:  .  rosuvastatin (CRESTOR) 5 MG tablet, TAKE 1 TABLET BY MOUTH DAILY, Disp: 30 tablet, Rfl: 6 .  traZODone (DESYREL) 50 MG tablet, Take 50 mg by mouth at bedtime., Disp: , Rfl:  .  warfarin (COUMADIN) 5 MG  tablet, Take 5 mg by mouth daily., Disp: , Rfl:    Allergies  Allergen Reactions  . Sulfa Antibiotics Diarrhea    ROS Review of Systems  Constitutional: Negative.   HENT: Negative.   Eyes: Negative.   Respiratory: Negative.   Cardiovascular: Negative.   Gastrointestinal: Negative.   Endocrine: Negative.   Genitourinary: Negative.   Musculoskeletal:       Reports left hip pain  Allergic/Immunologic: Negative.   Neurological: Negative.   Hematological: Negative.   Psychiatric/Behavioral: Positive for dysphoric mood. The patient is nervous/anxious.       Objective:    Physical Exam  Constitutional: The patient is oriented to person, place, and time. Pt appears well-developed and well-nourished.  Head: Normocephalic and atraumatic.  Eyes: Pupils are equal, round, and reactive to light.  Neck: No JVD present. No tracheal deviation present. No thyromegaly present.  Cardiovascular: Regular rate and rhythm. No gallop. Pulmonary/Chest: Normal breath sounds. Lungs clear to auscultation. Abdominal: No abdominal tenderness. No guarding or rebound tenderness.. Musculoskeletal: Normal range of motion.  Lymphatic: No cervical adenopathy.  Neurological: No cranial nerve deficit.  Skin: Skin is warm and hydrated.  Psychiatric: The patient has a normal mood and affect.  BP (!) 141/75   Pulse 82   Wt 216 lb 6.4 oz (98.2 kg)   BMI 32.90 kg/m  Wt Readings from Last 3 Encounters:  06/18/19 216 lb 6.4 oz (98.2 kg)  06/11/19 216 lb (98 kg)  03/04/19 215 lb 6.4 oz (97.7 kg)     Health Maintenance Due  Topic Date Due  . Hepatitis C  Screening  Never done  . COVID-19 Vaccine (1) Never done  . TETANUS/TDAP  Never done  . COLONOSCOPY  Never done  . PNA vac Low Risk Adult (1 of 2 - PCV13) Never done    There are no preventive care reminders to display for this patient.  Lab Results  Component Value Date   TSH 1.065 10/04/2017   Lab Results  Component Value Date   WBC 6.4 04/28/2018   HGB 12.6 04/28/2018   HCT 37.4 04/28/2018   MCV 88.0 04/28/2018   PLT 192 04/28/2018   Lab Results  Component Value Date   NA 138 01/12/2018   K 4.2 01/12/2018   CO2 24 01/12/2018   GLUCOSE 93 03/04/2019   BUN 22 01/12/2018   CREATININE 0.88 01/12/2018   BILITOT 0.5 01/10/2018   ALKPHOS 148 (H) 01/10/2018   AST 26 01/10/2018   ALT 24 01/10/2018   PROT 7.2 01/10/2018   ALBUMIN 4.4 01/10/2018   CALCIUM 8.9 01/12/2018   ANIONGAP 7 01/12/2018   Lab Results  Component Value Date   CHOL 151 02/18/2015   Lab Results  Component Value Date   HDL 65 02/18/2015   Lab Results  Component Value Date   LDLCALC 76 02/18/2015   Lab Results  Component Value Date   TRIG 49 02/18/2015   Lab Results  Component Value Date   CHOLHDL 2.3 02/18/2015   Lab Results  Component Value Date   HGBA1C 5.9 (H) 10/04/2017      Assessment & Plan:     Meds ordered this encounter  Medications  . ALPRAZolam (XANAX) 0.25 MG tablet    Sig: Take 1 tablet (0.25 mg total) by mouth 2 (two) times daily.    Dispense:  30 tablet    Refill:  2   1. Anxiety Patient has a flareup of the anxiety because of recent stress. - ALPRAZolam (  XANAX) 0.25 MG tablet; Take 1 tablet (0.25 mg total) by mouth 2 (two) times daily.  Dispense: 30 tablet; Refill: 2  2. Breast cancer, stage 2, left [facility]) Patient CT scan has revealed  signs of lytic lesion on the left side of the pelvis.  Patient is going to get a bone scan done we will follow-up with oncologist and radiation therapy. Follow-up: Return in about 2 months (around 08/18/2019).    3.0   Tachycardia     due to anxiety of the recurrent cancer. By signing my name below, I, De Burrs, attest that this documentation has been prepared under the direction and in the presence of [name], MD. Electronically Signed: De Burrs, Medical Scribe. 06/18/19. 5:41 PM    I personally performed the services described in this documentation, which was SCRIBED in my presence. The recorded information has been reviewed and considered accurate. It has been edited as necessary during review. [name], MD

## 2019-06-18 NOTE — Assessment & Plan Note (Signed)
Due to anxiety of recurring cancer.

## 2019-06-19 ENCOUNTER — Encounter
Admission: RE | Admit: 2019-06-19 | Discharge: 2019-06-19 | Disposition: A | Discharge status: Home / Self Care | Payer: Medicare Other | Source: Ambulatory Visit | Attending: Radiation Oncology | Admitting: Radiation Oncology

## 2019-06-19 ENCOUNTER — Other Ambulatory Visit: Payer: Self-pay

## 2019-06-19 DIAGNOSIS — C7951 Secondary malignant neoplasm of bone: Secondary | ICD-10-CM | POA: Diagnosis not present

## 2019-06-19 DIAGNOSIS — C50912 Malignant neoplasm of unspecified site of left female breast: Secondary | ICD-10-CM | POA: Diagnosis not present

## 2019-06-19 MED ORDER — TECHNETIUM TC 99M MEDRONATE IV KIT
20.0000 | PACK | Freq: Once | INTRAVENOUS | Status: AC | PRN
  Administered 2019-06-19: 21.85 via INTRAVENOUS

## 2019-06-19 NOTE — Progress Notes (Signed)
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  INTERVAL HISTORY: Patient returns to clinic today for further evaluation and discussion of her nuclear med bone scans.  She initially complained of left hip pain and subsequent x-ray revealed a suspicious lesion that led to her bone scan.  She is highly anxious, but offers no further complaints.  She denies any other pain.  She continues to have a mild peripheral neuropathy, but no other neurologic complaints. She denies any recent fevers or illnesses.  She denies any chest pain, shortness of breath, cough, or hemoptysis.  She denies any nausea, vomiting, constipation, or diarrhea.  She has no urinary complaints.  Patient offers no further specific complaints today.  REVIEW OF SYSTEMS:   Review of Systems  Constitutional: Negative.  Negative for fever, malaise/fatigue and weight loss.  Respiratory: Negative.  Negative for cough and shortness of breath.   Cardiovascular: Negative.  Negative for chest pain and leg swelling.  Gastrointestinal: Negative.  Negative for abdominal pain, constipation and nausea.  Genitourinary: Negative.  Negative for dysuria and urgency.  Musculoskeletal: Positive for joint pain. Negative for back pain.  Skin: Negative.  Negative for rash.  Neurological: Positive for  tingling and sensory change. Negative for dizziness, focal weakness, weakness and headaches.  Psychiatric/Behavioral: The patient is nervous/anxious.     As per HPI. Otherwise, a complete review of systems is negative.  PAST MEDICAL HISTORY: Past Medical History:  Diagnosis Date  . Anxiety   . Cancer ([place]) 07/2017   left breast  . Depression   . Hypertension   . Hypothyroidism   . Personal history of chemotherapy 2019   LEFT mastectomy-chemo before  . Personal history of radiation therapy 03/2018   LEFT mastectomy  . Rapid heart rate   . Thyroid disease     PAST SURGICAL HISTORY: Past Surgical History:  Procedure Laterality Date  . AXILLARY LYMPH NODE BIOPSY Left 07/16/2017   METASTATIC MAMMARY CARCINOMA  . BREAST BIOPSY Left 07/16/2017   [place] bx of left breast mass and left breast LN.  INVASIVE MAMMARY CARCINOMA, NO SPECIAL TYPE.   Marland Kitchen BREAST EXCISIONAL BIOPSY Right 2001   benign  . BREAST LUMPECTOMY WITH SENTINEL LYMPH NODE BIOPSY Left 12/06/2017   Procedure: BREAST LUMPECTOMY WITH SENTINEL LYMPH NODE BX;  Surgeon: Robert Bellow, MD;  Location: [facility] ORS;  Service: General;  Laterality: Left;  . COLONOSCOPY    . MASTECTOMY Left 12/23/2017  . PORTACATH PLACEMENT Right 08/07/2017   Procedure: INSERTION PORT-A-CATH;  Surgeon: Robert Bellow, MD;  Location: [facility] ORS;  Service: General;  Laterality: Right;  . SIMPLE MASTECTOMY WITH AXILLARY SENTINEL NODE BIOPSY Left 12/23/2017   T2,N2 with 6/7 nodes positive. Whole breast radiation.  Surgeon: Robert Bellow, MD;  Location: [facility] ORS;  Service: General;  Laterality: Left;  . TONSILLECTOMY      FAMILY HISTORY: Family History  Problem Relation Age of Onset  . Stroke Mother   .  Thyroid disease Mother   . Renal Disease Mother   . Stroke Father   . Heart attack Father   . Sudden death Father 3       suicide  . Anuerysm Brother   . Breast cancer Neg Hx     ADVANCED DIRECTIVES (Y/N):  N  HEALTH  MAINTENANCE: Social History   Tobacco Use  . Smoking status: Current Some Day Smoker    Packs/day: 0.25    Years: 30.00    Pack years: 7.50    Types: Cigarettes  . Smokeless tobacco: Never Used  Substance Use Topics  . Alcohol use: Yes    Comment: occasional q 78mo  . Drug use: Never     Colonoscopy:  PAP:  Bone density:  Lipid panel:  Allergies  Allergen Reactions  . Sulfa Antibiotics Diarrhea    Current Outpatient Medications  Medication Sig Dispense Refill  . acetaminophen (TYLENOL) 500 MG tablet Take 1,000 mg by mouth every 6 (six) hours as needed for moderate pain or headache.     . albuterol (PROVENTIL HFA;VENTOLIN HFA) 108 (90 Base) MCG/ACT inhaler Inhale 2 puffs into the lungs every 6 (six) hours as needed for wheezing or shortness of breath. 1 Inhaler 2  . ALPRAZolam (XANAX) 0.25 MG tablet Take 1 tablet (0.25 mg total) by mouth 2 (two) times daily. 30 tablet 2  . amLODipine (NORVASC) 5 MG tablet TAKE 1 TABLET BY MOUTH DAILY 90 tablet 3  . anastrozole (ARIMIDEX) 1 MG tablet Take 1 tablet (1 mg total) by mouth daily. 90 tablet 3  . aspirin EC 81 MG tablet Take 81 mg by mouth at bedtime.     .Marland KitchenbuPROPion (WELLBUTRIN SR) 150 MG 12 hr tablet Take 150 mg by mouth 2 (two) times daily.    . calcium carbonate (OS-CAL - DOSED IN MG OF ELEMENTAL CALCIUM) 1250 (500 Ca) MG tablet Take 1 tablet by mouth daily with breakfast.    . calcium-vitamin D (OSCAL WITH D) 250-125 MG-UNIT tablet Take 1 tablet by mouth 2 (two) times daily.    . Cholecalciferol (VITAMIN D3) 125 MCG (5000 UT) CAPS Take 1 capsule by mouth 2 (two) times a day.    . letrozole (FEMARA) 2.5 MG tablet Take 1 tablet (2.5 mg total) by mouth daily. 30 tablet 3  . levothyroxine (SYNTHROID, LEVOTHROID) 100 MCG tablet Take 100 mcg by mouth daily before breakfast.    . lidocaine-prilocaine (EMLA) cream Apply to affected area once 30 g 3  . losartan-hydrochlorothiazide (HYZAAR) 100-12.5 MG tablet Take 1 tablet by mouth daily.     . Magnesium Carbonate Heavy POWD by Does not apply route. Natural Vitality Calm magnesium supplement    . rosuvastatin (CRESTOR) 5 MG tablet TAKE 1 TABLET BY MOUTH DAILY 30 tablet 6  . traZODone (DESYREL) 50 MG tablet TAKE 1 TABLET BY MOUTH DAILY 30 tablet 6  . warfarin (COUMADIN) 5 MG tablet Take 5 mg by mouth daily.     No current facility-administered medications for this visit.    OBJECTIVE: Vitals:   06/23/19 1257  BP: 119/67  Pulse: 72  Temp: (!) 97.5 F (36.4 C)  SpO2: 99%     Body mass index is 32.99 kg/m.    ECOG FS:0 - Asymptomatic  General: Well-developed, well-nourished, no acute distress. Eyes: Pink conjunctiva, anicteric sclera. HEENT: Normocephalic, moist mucous membranes. Lungs: No audible wheezing or coughing. Heart: Regular rate and rhythm. Abdomen: Soft, nontender, no obvious distention. Musculoskeletal: No edema, cyanosis, or clubbing.  Neuro: Alert, answering all questions appropriately. Cranial nerves grossly intact. Skin: No rashes or petechiae noted. Psych: Normal affect.   LAB RESULTS:  Lab Results  Component Value Date   NA 138 01/12/2018   K 4.2 01/12/2018   CL 107 01/12/2018   CO2 24 01/12/2018   GLUCOSE 93 03/04/2019   BUN 22 01/12/2018   CREATININE 0.88 01/12/2018   CALCIUM 8.9 01/12/2018   PROT 7.2 01/10/2018   ALBUMIN 4.4 01/10/2018   AST 26 01/10/2018   ALT 24 01/10/2018   ALKPHOS 148 (H) 01/10/2018   BILITOT 0.5 01/10/2018   GFRNONAA >60 01/12/2018   GFRAA >60 01/12/2018    Lab Results  Component Value Date   WBC 6.4 04/28/2018   NEUTROABS 4.2 11/14/2017   HGB 12.6 04/28/2018   HCT 37.4 04/28/2018   MCV 88.0 04/28/2018   PLT 192 04/28/2018     STUDIES: NM Bone Scan Whole Body  Result Date: 06/19/2019 CLINICAL DATA:  LEFT breast cancer with bone metastases EXAM: NUCLEAR MEDICINE WHOLE BODY BONE SCAN TECHNIQUE: Whole body anterior and posterior images were obtained approximately 3 hours after intravenous injection of  radiopharmaceutical. RADIOPHARMACEUTICALS:  21.85 mCi Technetium-74mMDP IV COMPARISON:  None Correlation: LEFT hip radiographs 06/11/2019 FINDINGS: Abnormal tracer uptake at the anterior LEFT iliac bone corresponding to large lytic metastasis on recent LEFT hip radiographs. Uptake at the RIGHT [facility], sternoclavicular joints greater on RIGHT, knees, and feet, typically degenerative. Uptake at lateral aspects of lumbar spine at L4-L5 and L5-S1, likely degenerative. Foci of abnormal tracer uptake at lateral margin of the LEFT scapula and either the tip of the RIGHT scapula or the underlying RIGHT sixth rib concerning for metastasis. No additional sites of worrisome tracer uptake are identified. IMPRESSION: Osseous metastases LEFT iliac bone and questionably LEFT scapula and RIGHT scapula versus posterior RIGHT sixth rib. Electronically Signed   By: MLavonia DanaM.D.   On: 06/19/2019 22:58   DG HIP UNILAT WITH PELVIS 2-3 VIEWS LEFT  Result Date: 06/12/2019 CLINICAL DATA:  Left hip pain, history of breast cancer EXAM: DG HIP (WITH OR WITHOUT PELVIS) 2-3V LEFT COMPARISON:  CT chest abdomen pelvis, 08/01/2017 FINDINGS: There is no evidence of hip fracture or dislocation. No significant arthropathy. There are large lytic lesions of the anterior left ilium, new compared to prior CT dated 08/01/2017. IMPRESSION: 1. No fracture or dislocation of the left hip. The joint space is preserved. 2. There are large lytic lesions of the anterior left ilium, new compared to prior CT dated 08/01/2017 and highly concerning for osseous metastatic disease given history of breast malignancy. These results will be called to the ordering clinician or representative by the Radiologist Assistant, and communication documented in the PACS or [employer] Electronically Signed   By: AEddie CandleM.D.   On: 06/12/2019 09:30    ASSESSMENT: Clinical stage IIA ER/PR positive, HER-2 negative invasive carcinoma of the upper out quadrant of  the left breast.  Now with recurrent bony disease.  PLAN:    1. 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  Case was also discussed extensively at case conference.  Patient received neoadjuvant chemotherapy with Adriamycin and Cytoxan.  She only received 4 cycles of weekly Taxol prior to discontinuation of treatment on October 31, 2017 secondary to persistent peripheral neuropathy.  She ultimately required mastectomy and final pathology noted 5 of 6 lymph nodes positive for  disease.  Patient completed adjuvant XRT in mid April 2020.  Pain initiated letrozole in May 2020, this was discontinued secondary to side effects and patient was started on anastrozole.  Despite anastrozole, patient appears to have recurrent metastatic disease.  Nuclear med bone scans reviewed independently and reported as above with osseous metastasis in left iliac bone and bilateral scapula.  Patient's CA 27-29 has also increased to 141.9.  We will get a PET scan and MRI of the brain to complete the staging work-up.  Return to clinic in approximately 1 week after PET scan to discuss the results and treatment planning.   2.  Peripheral neuropathy: Chronic and unchanged. 3.  Pulmonary embolus: Patient was diagnosed with a small pulmonary embolus on January 10, 2018.  She is no longer on anticoagulation. 4.  Osteopenia: Patient's most recent bone mineral density on March 03, 2019 reported a T score of -1.8 which is only mildly decreased from 1 year prior when the reported T score was -1.6.  Continue calcium and vitamin D supplementation.  Repeat in February 2022.   5.  Left hip pain: Likely secondary to metastatic disease.  Continue follow-up with radiation oncology.  I spent a total of 30 minutes reviewing chart data, face-to-face evaluation with the patient, counseling and coordination of care as detailed  above.    Patient expressed understanding and was in agreement with this plan. She also understands that She can call clinic at any time with any questions, concerns, or complaints.   Cancer Staging Breast cancer, stage 2, left ([place]) Staging form: Breast, AJCC 8th Edition - Clinical stage from 07/30/2017: Stage IIA (cT2, cN1, cM0, G2, ER+, PR+, HER2-) - Signed by Lloyd Huger, MD on 07/30/2017   Lloyd Huger, MD   06/24/2019 6:21 AM

## 2019-06-21 ENCOUNTER — Other Ambulatory Visit: Payer: Self-pay | Admitting: Internal Medicine

## 2019-06-22 ENCOUNTER — Other Ambulatory Visit: Payer: Self-pay

## 2019-06-22 ENCOUNTER — Encounter: Payer: Self-pay | Admitting: Oncology

## 2019-06-22 ENCOUNTER — Other Ambulatory Visit: Payer: Self-pay | Admitting: Internal Medicine

## 2019-06-22 NOTE — Progress Notes (Signed)
Pt reports pain in hip and also anxiety surrounding results of bone scan.

## 2019-06-23 ENCOUNTER — Inpatient Hospital Stay: Payer: Medicare Other | Attending: Oncology | Admitting: Oncology

## 2019-06-23 ENCOUNTER — Inpatient Hospital Stay: Payer: Medicare Other

## 2019-06-23 ENCOUNTER — Ambulatory Visit
Admission: RE | Admit: 2019-06-23 | Discharge: 2019-06-23 | Disposition: A | Discharge status: Home / Self Care | Payer: Medicare Other | Source: Ambulatory Visit | Attending: Radiation Oncology | Admitting: Radiation Oncology

## 2019-06-23 VITALS — BP 119/67 | HR 72 | Temp 97.5°F | Wt 217.0 lb

## 2019-06-23 DIAGNOSIS — C50412 Malignant neoplasm of upper-outer quadrant of left female breast: Secondary | ICD-10-CM | POA: Insufficient documentation

## 2019-06-23 DIAGNOSIS — Z170 Estrogen receptor positive status [ER+]: Secondary | ICD-10-CM | POA: Diagnosis not present

## 2019-06-23 DIAGNOSIS — Z9012 Acquired absence of left breast and nipple: Secondary | ICD-10-CM | POA: Diagnosis not present

## 2019-06-23 DIAGNOSIS — M8580 Other specified disorders of bone density and structure, unspecified site: Secondary | ICD-10-CM | POA: Insufficient documentation

## 2019-06-23 DIAGNOSIS — Z923 Personal history of irradiation: Secondary | ICD-10-CM | POA: Diagnosis not present

## 2019-06-23 DIAGNOSIS — C7951 Secondary malignant neoplasm of bone: Secondary | ICD-10-CM | POA: Diagnosis not present

## 2019-06-23 DIAGNOSIS — Z9221 Personal history of antineoplastic chemotherapy: Secondary | ICD-10-CM | POA: Diagnosis not present

## 2019-06-23 DIAGNOSIS — M25552 Pain in left hip: Secondary | ICD-10-CM | POA: Insufficient documentation

## 2019-06-23 DIAGNOSIS — C50912 Malignant neoplasm of unspecified site of left female breast: Secondary | ICD-10-CM | POA: Diagnosis not present

## 2019-06-23 DIAGNOSIS — Z79899 Other long term (current) drug therapy: Secondary | ICD-10-CM | POA: Diagnosis not present

## 2019-06-23 DIAGNOSIS — Z86711 Personal history of pulmonary embolism: Secondary | ICD-10-CM | POA: Diagnosis not present

## 2019-06-23 DIAGNOSIS — Z5111 Encounter for antineoplastic chemotherapy: Secondary | ICD-10-CM | POA: Diagnosis present

## 2019-06-23 DIAGNOSIS — G629 Polyneuropathy, unspecified: Secondary | ICD-10-CM | POA: Diagnosis not present

## 2019-06-23 DIAGNOSIS — F17210 Nicotine dependence, cigarettes, uncomplicated: Secondary | ICD-10-CM | POA: Insufficient documentation

## 2019-06-23 DIAGNOSIS — Z79810 Long term (current) use of selective estrogen receptor modulators (SERMs): Secondary | ICD-10-CM | POA: Insufficient documentation

## 2019-06-24 LAB — CANCER ANTIGEN 27.29: CA 27.29: 141.9 U/mL — ABNORMAL HIGH (ref 0.0–38.6)

## 2019-06-29 NOTE — Progress Notes (Signed)
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  INTERVAL HISTORY: Patient returns to clinic today for further evaluation, discussion of her PET scan results, and treatment planning.  She continues to have left hip pain and is anxious, but otherwise feels well. She denies any other pain.  She continues to have a mild peripheral neuropathy, but no other neurologic complaints. She denies any recent fevers or illnesses.  She denies any chest pain, shortness of breath, cough, or hemoptysis.  She denies any nausea, vomiting, constipation, or diarrhea.  She has no urinary complaints.  Patient offers no further specific complaints today.  REVIEW OF SYSTEMS:   Review of Systems  Constitutional: Negative.  Negative for fever, malaise/fatigue and weight loss.  Respiratory: Negative.  Negative for cough and shortness of breath.   Cardiovascular: Negative.  Negative for chest pain and leg swelling.  Gastrointestinal: Negative.  Negative for abdominal pain, constipation and nausea.  Genitourinary: Negative.  Negative for dysuria and urgency.  Musculoskeletal: Positive for joint pain. Negative for back pain.  Skin: Negative.  Negative for rash.  Neurological: Positive for tingling and sensory change. Negative for dizziness, focal weakness, weakness and headaches.  Psychiatric/Behavioral: The patient is nervous/anxious.     As per  HPI. Otherwise, a complete review of systems is negative.  PAST MEDICAL HISTORY: Past Medical History:  Diagnosis Date  . Anxiety   . Cancer ([place]) 07/2017   left breast  . Depression   . Hypertension   . Hypothyroidism   . Personal history of chemotherapy 2019   LEFT mastectomy-chemo before  . Personal history of radiation therapy 03/2018   LEFT mastectomy  . Rapid heart rate   . Thyroid disease     PAST SURGICAL HISTORY: Past Surgical History:  Procedure Laterality Date  . AXILLARY LYMPH NODE BIOPSY Left 07/16/2017   METASTATIC MAMMARY CARCINOMA  . BREAST BIOPSY Left 07/16/2017   [place] bx of left breast mass and left breast LN.  INVASIVE MAMMARY CARCINOMA, NO SPECIAL TYPE.   Marland Kitchen BREAST EXCISIONAL BIOPSY Right 2001   benign  . BREAST LUMPECTOMY WITH SENTINEL LYMPH NODE BIOPSY Left 12/06/2017   Procedure: BREAST LUMPECTOMY WITH SENTINEL LYMPH NODE BX;  Surgeon: Robert Bellow, MD;  Location: [facility] ORS;  Service: General;  Laterality: Left;  . COLONOSCOPY    . MASTECTOMY Left 12/23/2017  . PORTACATH PLACEMENT Right 08/07/2017   Procedure: INSERTION PORT-A-CATH;  Surgeon: Robert Bellow, MD;  Location: [facility] ORS;  Service: General;  Laterality: Right;  . SIMPLE MASTECTOMY WITH AXILLARY SENTINEL NODE BIOPSY Left 12/23/2017   T2,N2 with 6/7 nodes positive. Whole breast radiation.  Surgeon: Robert Bellow, MD;  Location: [facility] ORS;  Service: General;  Laterality: Left;  . TONSILLECTOMY      FAMILY HISTORY: Family History  Problem Relation Age of Onset  . Stroke Mother   . Thyroid disease Mother   . Renal Disease Mother   . Stroke Father   . Heart attack Father   . Sudden death Father 78  suicide  . Anuerysm Brother   . Breast cancer Neg Hx     ADVANCED DIRECTIVES (Y/N):  N  HEALTH MAINTENANCE: Social History   Tobacco Use  . Smoking status: Current Some Day Smoker    Packs/day: 0.25    Years: 30.00    Pack years: 7.50    Types: Cigarettes  . Smokeless  tobacco: Never Used  Vaping Use  . Vaping Use: Never used  Substance Use Topics  . Alcohol use: Yes    Comment: occasional q 32mo  . Drug use: Never     Colonoscopy:  PAP:  Bone density:  Lipid panel:  Allergies  Allergen Reactions  . Sulfa Antibiotics Diarrhea    Current Outpatient Medications  Medication Sig Dispense Refill  . acetaminophen (TYLENOL) 500 MG tablet Take 1,000 mg by mouth every 6 (six) hours as needed for moderate pain or headache.     . albuterol (PROVENTIL HFA;VENTOLIN HFA) 108 (90 Base) MCG/ACT inhaler Inhale 2 puffs into the lungs every 6 (six) hours as needed for wheezing or shortness of breath. 1 Inhaler 2  . ALPRAZolam (XANAX) 0.25 MG tablet Take 1 tablet (0.25 mg total) by mouth 2 (two) times daily. 30 tablet 2  . amLODipine (NORVASC) 5 MG tablet TAKE 1 TABLET BY MOUTH DAILY 90 tablet 3  . anastrozole (ARIMIDEX) 1 MG tablet Take 1 tablet (1 mg total) by mouth daily. 90 tablet 3  . aspirin EC 81 MG tablet Take 81 mg by mouth at bedtime.     .Marland KitchenbuPROPion (WELLBUTRIN SR) 150 MG 12 hr tablet Take 150 mg by mouth 2 (two) times daily.    . calcium carbonate (OS-CAL - DOSED IN MG OF ELEMENTAL CALCIUM) 1250 (500 Ca) MG tablet Take 1 tablet by mouth daily with breakfast.    . calcium-vitamin D (OSCAL WITH D) 250-125 MG-UNIT tablet Take 1 tablet by mouth 2 (two) times daily.    . Cholecalciferol (VITAMIN D3) 125 MCG (5000 UT) CAPS Take 1 capsule by mouth 2 (two) times a day.    . letrozole (FEMARA) 2.5 MG tablet Take 1 tablet (2.5 mg total) by mouth daily. 30 tablet 3  . levothyroxine (SYNTHROID, LEVOTHROID) 100 MCG tablet Take 100 mcg by mouth daily before breakfast.    . losartan-hydrochlorothiazide (HYZAAR) 100-12.5 MG tablet Take 1 tablet by mouth daily.    . Magnesium Carbonate Heavy POWD by Does not apply route. Natural Vitality Calm magnesium supplement    . rosuvastatin (CRESTOR) 5 MG tablet TAKE 1 TABLET BY MOUTH DAILY 30 tablet 6  . traZODone (DESYREL) 50  MG tablet TAKE 1 TABLET BY MOUTH DAILY 30 tablet 6  . warfarin (COUMADIN) 5 MG tablet Take 5 mg by mouth daily.     No current facility-administered medications for this visit.    OBJECTIVE: Vitals:   07/02/19 0937  BP: 118/68  Pulse: 65  Resp: 20  Temp: (!) 95.9 F (35.5 C)  SpO2: 99%     Body mass index is 33.68 kg/m.    ECOG FS:0 - Asymptomatic  General: Well-developed, well-nourished, no acute distress. Eyes: Pink conjunctiva, anicteric sclera. HEENT: Normocephalic, moist mucous membranes. Lungs: No audible wheezing or coughing. Heart: Regular rate and rhythm. Abdomen: Soft, nontender, no obvious distention. Musculoskeletal: No edema, cyanosis, or clubbing. Neuro: Alert, answering all questions appropriately. Cranial nerves grossly intact. Skin: No rashes or petechiae noted. Psych: Normal affect.   LAB RESULTS:  Lab Results  Component Value Date   NA 138  01/12/2018   K 4.2 01/12/2018   CL 107 01/12/2018   CO2 24 01/12/2018   GLUCOSE 93 03/04/2019   BUN 22 01/12/2018   CREATININE 0.88 01/12/2018   CALCIUM 8.9 01/12/2018   PROT 7.2 01/10/2018   ALBUMIN 4.4 01/10/2018   AST 26 01/10/2018   ALT 24 01/10/2018   ALKPHOS 148 (H) 01/10/2018   BILITOT 0.5 01/10/2018   GFRNONAA >60 01/12/2018   GFRAA >60 01/12/2018    Lab Results  Component Value Date   WBC 6.4 04/28/2018   NEUTROABS 4.2 11/14/2017   HGB 12.6 04/28/2018   HCT 37.4 04/28/2018   MCV 88.0 04/28/2018   PLT 192 04/28/2018     STUDIES: NM Bone Scan Whole Body  Result Date: 06/19/2019 CLINICAL DATA:  LEFT breast cancer with bone metastases EXAM: NUCLEAR MEDICINE WHOLE BODY BONE SCAN TECHNIQUE: Whole body anterior and posterior images were obtained approximately 3 hours after intravenous injection of radiopharmaceutical. RADIOPHARMACEUTICALS:  21.85 mCi Technetium-58mMDP IV COMPARISON:  None Correlation: LEFT hip radiographs 06/11/2019 FINDINGS: Abnormal tracer uptake at the anterior LEFT iliac  bone corresponding to large lytic metastasis on recent LEFT hip radiographs. Uptake at the RIGHT [facility], sternoclavicular joints greater on RIGHT, knees, and feet, typically degenerative. Uptake at lateral aspects of lumbar spine at L4-L5 and L5-S1, likely degenerative. Foci of abnormal tracer uptake at lateral margin of the LEFT scapula and either the tip of the RIGHT scapula or the underlying RIGHT sixth rib concerning for metastasis. No additional sites of worrisome tracer uptake are identified. IMPRESSION: Osseous metastases LEFT iliac bone and questionably LEFT scapula and RIGHT scapula versus posterior RIGHT sixth rib. Electronically Signed   By: MLavonia DanaM.D.   On: 06/19/2019 22:58   NM PET Image Initial (PI) Skull Base To Thigh  Result Date: 06/30/2019 CLINICAL DATA:  Subsequent treatment strategy for breast cancer. EXAM: NUCLEAR MEDICINE PET SKULL BASE TO THIGH TECHNIQUE: 11.2 mCi F-18 FDG was injected intravenously. Full-ring PET imaging was performed from the skull base to thigh after the radiotracer. CT data was obtained and used for attenuation correction and anatomic localization. Fasting blood glucose: 91 mg/dl COMPARISON:  CTA chest 01/10/2018. Chest/Abdomen/pelvis CT 08/01/2017 FINDINGS: Mediastinal blood pool activity: SUV max 2.7 Liver activity: SUV max NA NECK: Asymmetric marked focal hypermetabolism is identified in the posterior right nasopharynx in the region of the fossa of Rosenmuller. SUV max = 7.5. No underlying mass lesion evident by noncontrast CT imaging. No hypermetabolic lymphadenopathy in the neck. Incidental CT findings: none CHEST: No hypermetabolic mediastinal or hilar nodes. No suspicious pulmonary nodules on the CT scan. Low level uptake identified anterior left chest wall at the mastectomy site, likely related to scarring. Incidental CT findings: There is abdominal aortic atherosclerosis without aneurysm. Centrilobular emphsyema noted. Calcified granuloma noted  posterior right lower lobe. ABDOMEN/PELVIS: No abnormal hypermetabolic activity within the liver, pancreas, adrenal glands, or spleen. No hypermetabolic lymph nodes in the abdomen or pelvis. Incidental CT findings: 4.6 cm cyst noted left kidney There is abdominal aortic atherosclerosis without aneurysm. Diverticular changes noted left colon without diverticulitis. SKELETON: Large lytic soft tissue mass identified in the anterior left iliac bone extending from the acetabulum up to the level of the anterior superior iliac spine. This lesion is markedly hypermetabolic with SUV max = 133.3 Tiny hypermetabolic focus identified inferior left scapula with SUV max = 3.4. Subtle lucent lesion at this location on CT image 79/3. Focal uptake identified left humeral diaphysis with associated soft tissue replacement  of fatty marrow visible on CT imaging (image 20/series 3). SUV max = 3.2. Incidental CT findings: none IMPRESSION: 1. Large hypermetabolic destructive lesion in the anterior left iliac bone consistent with metastatic disease. Hypermetabolic lesions in the left humerus and left scapula also consistent with metastatic involvement. 2. Asymmetric focal marked hypermetabolism in the posterior right nasopharynx, in the region of the fossa Rosenmuller. Although no underlying mass lesion evident on noncontrast CT imaging today, direct inspection recommended as mucosal lesion a concern. 3. No evidence for soft tissue hypermetabolic metastases in the chest, abdomen, or pelvis. 4.  Aortic Atherosclerois (ICD10-170.0) Electronically Signed   By: Misty Stanley M.D.   On: 06/30/2019 12:29   DG HIP UNILAT WITH PELVIS 2-3 VIEWS LEFT  Result Date: 06/12/2019 CLINICAL DATA:  Left hip pain, history of breast cancer EXAM: DG HIP (WITH OR WITHOUT PELVIS) 2-3V LEFT COMPARISON:  CT chest abdomen pelvis, 08/01/2017 FINDINGS: There is no evidence of hip fracture or dislocation. No significant arthropathy. There are large lytic lesions of  the anterior left ilium, new compared to prior CT dated 08/01/2017. IMPRESSION: 1. No fracture or dislocation of the left hip. The joint space is preserved. 2. There are large lytic lesions of the anterior left ilium, new compared to prior CT dated 08/01/2017 and highly concerning for osseous metastatic disease given history of breast malignancy. These results will be called to the ordering clinician or representative by the Radiologist Assistant, and communication documented in the PACS or [employer]. Electronically Signed   By: Eddie Candle M.D.   On: 06/12/2019 09:30   ONCOLOGY HISTORY: Patient initially received neoadjuvant chemotherapy with Adriamycin and Cytoxan followed by weekly Taxol. She only received 4 cycles of weekly Taxol prior to discontinuation of treatment on October 31, 2017 secondary to persistent peripheral neuropathy.  She ultimately required mastectomy and final pathology noted 5 of 6 lymph nodes positive for disease.  Patient completed adjuvant XRT in mid April 2020.  Pain initiated letrozole in May 2020, this was discontinued secondary to side effects and patient was started on anastrozole.  Nuclear medicine bone scan on June 19, 2019 revealed metastatic disease and anastrozole was subsequently discontinued.  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  PLAN:    1. 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  Patient's CA 27-29 has also increased to 141.9.  MRI of the brain is pending.  Proceed with 125 mg Ibrance daily for 21 days, with 7 days off.  In addition patient will receive Faslodex 500 mg intramuscularly on days 1, 15, and 29 and then continue with treatment every 4 weeks.  Patient will also receive Zometa every  4 weeks for her bony disease.  Return to clinic as scheduled for Faslodex injections and then in approximately 5 weeks for further evaluation and continuation of treatment.    2.  Peripheral neuropathy: Chronic and unchanged. 3.  Pulmonary embolus: Patient was diagnosed with a small pulmonary embolus on January 10, 2018.  She is no longer on anticoagulation. 4.  Osteopenia: Patient's most recent bone mineral density on March 03, 2019 reported a T score of -1.8 which is only mildly decreased from 1 year prior when the reported T score was -1.6.  Continue calcium and vitamin D supplementation.     5.  Left hip pain: Secondary to metastatic disease.  Patient will initiate XRT in the near future.   Patient expressed understanding and was in agreement with this plan. She also understands that She can call clinic at any time with any questions, concerns, or complaints.   Cancer Staging Breast cancer, stage 2, left ([place]) Staging form: Breast, AJCC 8th Edition - Clinical stage from 07/30/2017: Stage IIA (cT2, cN1, cM0, G2, ER+, PR+, HER2-) - Signed by Lloyd Huger, MD on 07/30/2017   Lloyd Huger, MD   07/03/2019 6:23 AM

## 2019-06-30 ENCOUNTER — Ambulatory Visit
Admission: RE | Admit: 2019-06-30 | Discharge: 2019-06-30 | Disposition: A | Discharge status: Home / Self Care | Payer: Medicare Other | Source: Ambulatory Visit | Attending: Oncology | Admitting: Oncology

## 2019-06-30 ENCOUNTER — Other Ambulatory Visit: Payer: Self-pay

## 2019-06-30 DIAGNOSIS — C50912 Malignant neoplasm of unspecified site of left female breast: Secondary | ICD-10-CM

## 2019-06-30 DIAGNOSIS — C50919 Malignant neoplasm of unspecified site of unspecified female breast: Secondary | ICD-10-CM | POA: Insufficient documentation

## 2019-06-30 DIAGNOSIS — C7951 Secondary malignant neoplasm of bone: Secondary | ICD-10-CM | POA: Diagnosis not present

## 2019-06-30 LAB — GLUCOSE, CAPILLARY: Glucose-Capillary: 91 mg/dL (ref 70–99)

## 2019-06-30 MED ORDER — FLUDEOXYGLUCOSE F - 18 (FDG) INJECTION
11.2000 | Freq: Once | INTRAVENOUS | Status: AC | PRN
  Administered 2019-06-30: 11.15 via INTRAVENOUS

## 2019-07-01 ENCOUNTER — Encounter: Payer: Self-pay | Admitting: Oncology

## 2019-07-01 NOTE — Progress Notes (Signed)
Patient called for pre assessment. States she has some anxiety about results. She has seen report and has questions. Would like to know specifically if there is anything wrong with her heart. She states she has been having some pain that comes and goes in left mastectomy site. She is not sure if it is related to prior mastectomy or her heart. She also request if she could have a copy of the results. She denied other concerns at this time.

## 2019-07-02 ENCOUNTER — Ambulatory Visit
Admission: RE | Admit: 2019-07-02 | Discharge: 2019-07-02 | Disposition: A | Discharge status: Home / Self Care | Payer: Medicare Other | Source: Ambulatory Visit | Attending: Radiation Oncology | Admitting: Radiation Oncology

## 2019-07-02 ENCOUNTER — Other Ambulatory Visit: Payer: Self-pay

## 2019-07-02 ENCOUNTER — Encounter: Payer: Self-pay | Admitting: Oncology

## 2019-07-02 ENCOUNTER — Inpatient Hospital Stay (HOSPITAL_BASED_OUTPATIENT_CLINIC_OR_DEPARTMENT_OTHER): Payer: Medicare Other | Admitting: Oncology

## 2019-07-02 VITALS — BP 118/68 | HR 65 | Temp 95.9°F | Resp 20 | Wt 221.5 lb

## 2019-07-02 DIAGNOSIS — C7951 Secondary malignant neoplasm of bone: Secondary | ICD-10-CM | POA: Diagnosis present

## 2019-07-02 DIAGNOSIS — Z510 Encounter for antineoplastic radiation therapy: Secondary | ICD-10-CM | POA: Insufficient documentation

## 2019-07-02 DIAGNOSIS — Z170 Estrogen receptor positive status [ER+]: Secondary | ICD-10-CM | POA: Insufficient documentation

## 2019-07-02 DIAGNOSIS — C50912 Malignant neoplasm of unspecified site of left female breast: Secondary | ICD-10-CM | POA: Insufficient documentation

## 2019-07-02 DIAGNOSIS — Z5111 Encounter for antineoplastic chemotherapy: Secondary | ICD-10-CM | POA: Diagnosis not present

## 2019-07-03 ENCOUNTER — Telehealth: Payer: Self-pay | Admitting: Pharmacy Technician

## 2019-07-03 ENCOUNTER — Other Ambulatory Visit: Payer: Self-pay | Admitting: Emergency Medicine

## 2019-07-03 ENCOUNTER — Other Ambulatory Visit: Payer: Self-pay | Admitting: Oncology

## 2019-07-03 ENCOUNTER — Telehealth: Payer: Self-pay | Admitting: Pharmacist

## 2019-07-03 DIAGNOSIS — C50919 Malignant neoplasm of unspecified site of unspecified female breast: Secondary | ICD-10-CM

## 2019-07-03 DIAGNOSIS — C50912 Malignant neoplasm of unspecified site of left female breast: Secondary | ICD-10-CM

## 2019-07-03 MED ORDER — PALBOCICLIB 125 MG PO TABS: 125.0000 mg | ORAL_TABLET | Freq: Every day | ORAL | 0 refills | Status: DC

## 2019-07-03 NOTE — Telephone Encounter (Signed)
Oral Oncology Patient Advocate Encounter  Received notification from [facility] that prior authorization for Alicia [name] is required.  PA submitted on CoverMyMeds Key [id number]  Status is pending  Oral Oncology Clinic will continue to follow.  Alicia [name] Phone [phone] Fax [phone] 07/03/2019 10:56 AM

## 2019-07-03 NOTE — Telephone Encounter (Signed)
Oral Oncology Patient Advocate Encounter  Prior Authorization for Alicia [name] has been approved.    PA# [id number] Effective dates: 07/03/19 through 01/15/20  Patient must use [facility] per insurance.  Oral Oncology Clinic will continue to follow.   [place] Patient [place] Phone [phone] Fax [phone] 07/03/2019 11:16 AM

## 2019-07-03 NOTE — Telephone Encounter (Signed)
Oral Oncology Pharmacist Encounter  Received new prescription for Ibrance (palbociclib) for the treatment of recurrent stage IV ER/PR positive, HER2 negative in conjunction with fulvestrant, planned duration until disease progression or unacceptable drug toxicity.  Recommend monitoring CMP for hepatic impairment. CBC/CMP lab orders entered. Prescription dose and frequency assessed.   Current medication list in Epic reviewed, no relevant DDIs with palbociclib identified.  Patient required to use [facility].   Oral Oncology Clinic will continue to follow for copayment issues, initial counseling and start date.  Alyson N. Leonard, PharmD, BCPS, BCOP, CPP Hematology/Oncology Clinical Pharmacist Practitioner [facility]otherapy [facility] [phone]  07/03/2019 9:38 AM 

## 2019-07-06 ENCOUNTER — Telehealth: Payer: Self-pay | Admitting: Pharmacy Technician

## 2019-07-06 ENCOUNTER — Other Ambulatory Visit: Payer: Self-pay | Admitting: Oncology

## 2019-07-06 DIAGNOSIS — Z510 Encounter for antineoplastic radiation therapy: Secondary | ICD-10-CM | POA: Diagnosis not present

## 2019-07-06 MED ORDER — PALBOCICLIB 125 MG PO TABS: 125.0000 mg | ORAL_TABLET | Freq: Every day | ORAL | 0 refills | Status: DC

## 2019-07-06 MED ORDER — ONDANSETRON HCL 8 MG PO TABS: 8.0000 mg | ORAL_TABLET | Freq: Three times a day (TID) | ORAL | 0 refills | Status: DC | PRN

## 2019-07-06 NOTE — Telephone Encounter (Addendum)
Oral Chemotherapy Pharmacist Encounter  Ms. Alicia [name] knows to get started on her Alicia [name] on Thursday 07/09/19.  Patient Education I spoke with patient for overview of new oral chemotherapy medication: Ibrance (palbociclib) for the treatment of recurrent stage IV ER/PR positive, HER2 negative in conjunction with fulvestrant, planned duration until disease progression or unacceptable drug toxicity.   Pt is doing well. Counseled patient on administration, dosing, side effects, monitoring, drug-food interactions, safe handling, storage, and disposal. Patient will take 1 tablet (125 mg total) by mouth daily. Take for 21 days on, 7 days off, repeat every 28 days.  Side effects include but not limited to: decreased wbc, N/V, fatigue, hair thinning.    Reviewed with patient importance of keeping a medication schedule and plan for any missed doses.  Alicia [name] voiced understanding and appreciation. All questions answered. Medication handout placed in the mail.  Provided patient with [facility] phone number. Patient knows to call the office with questions or concerns. Oral Chemotherapy Navigation Clinic will continue to follow.  Darl Pikes, PharmD, BCPS, BCOP, CPP Hematology/Oncology Clinical Pharmacist Practitioner [facility] [phone]  07/06/2019 3:43 PM

## 2019-07-06 NOTE — Telephone Encounter (Signed)
Oral Oncology Patient Advocate Encounter  Met patient in waiting area to complete application for [facility] in an effort to reduce patient's out of pocket expense for Ibrance to $0.    Application completed and faxed to [phone].   [facility] patient assistance phone number for follow up is [phone].   This encounter will be updated until final determination.   [place] Patient [place] Phone [phone] Fax [phone] 07/09/2019 3:48 PM

## 2019-07-06 NOTE — Telephone Encounter (Signed)
Oral Chemotherapy Pharmacist Encounter   Patient has a high copay at [facility] for her [name]. Due to this Ms. Kosman will fill her [name] at [place] using a one time free voucher and proceed with a manufacturer assistance application for her [name].  Darl Pikes, PharmD, BCPS, BCOP, CPP Hematology/Oncology Clinical Pharmacist [facility] [place] [facility] [phone]  07/06/2019 3:25 PM

## 2019-07-07 ENCOUNTER — Ambulatory Visit: Payer: Medicare Other | Admitting: Oncology

## 2019-07-07 ENCOUNTER — Other Ambulatory Visit: Payer: Self-pay

## 2019-07-07 ENCOUNTER — Ambulatory Visit
Admission: RE | Admit: 2019-07-07 | Discharge: 2019-07-07 | Disposition: A | Discharge status: Home / Self Care | Payer: Medicare Other | Source: Ambulatory Visit | Attending: Oncology | Admitting: Oncology

## 2019-07-07 DIAGNOSIS — C50912 Malignant neoplasm of unspecified site of left female breast: Secondary | ICD-10-CM | POA: Insufficient documentation

## 2019-07-07 MED ORDER — GADOBUTROL 1 MMOL/ML IV SOLN
10.0000 mL | Freq: Once | INTRAVENOUS | Status: AC | PRN
  Administered 2019-07-07: 10 mL via INTRAVENOUS

## 2019-07-07 MED FILL — IBRANCE 125 MG TABS: 125 | 21 days supply | Qty: 21 | Fill #0

## 2019-07-08 ENCOUNTER — Ambulatory Visit: Admission: RE | Admit: 2019-07-08 | Payer: Medicare Other | Source: Ambulatory Visit

## 2019-07-08 DIAGNOSIS — Z510 Encounter for antineoplastic radiation therapy: Secondary | ICD-10-CM | POA: Diagnosis not present

## 2019-07-09 ENCOUNTER — Inpatient Hospital Stay: Payer: Medicare Other

## 2019-07-09 ENCOUNTER — Ambulatory Visit
Admission: RE | Admit: 2019-07-09 | Discharge: 2019-07-09 | Disposition: A | Discharge status: Home / Self Care | Payer: Medicare Other | Source: Ambulatory Visit | Attending: Radiation Oncology | Admitting: Radiation Oncology

## 2019-07-09 ENCOUNTER — Other Ambulatory Visit: Payer: Self-pay

## 2019-07-09 DIAGNOSIS — Z5111 Encounter for antineoplastic chemotherapy: Secondary | ICD-10-CM | POA: Diagnosis not present

## 2019-07-09 DIAGNOSIS — C50912 Malignant neoplasm of unspecified site of left female breast: Secondary | ICD-10-CM

## 2019-07-09 DIAGNOSIS — Z510 Encounter for antineoplastic radiation therapy: Secondary | ICD-10-CM | POA: Diagnosis not present

## 2019-07-09 DIAGNOSIS — C50919 Malignant neoplasm of unspecified site of unspecified female breast: Secondary | ICD-10-CM

## 2019-07-09 LAB — CBC WITH DIFFERENTIAL/PLATELET
Abs Immature Granulocytes: 0.03 10*3/uL (ref 0.00–0.07)
Basophils Absolute: 0.1 10*3/uL (ref 0.0–0.1)
Basophils Relative: 1 %
Eosinophils Absolute: 0.2 10*3/uL (ref 0.0–0.5)
Eosinophils Relative: 3 %
HCT: 38.2 % (ref 36.0–46.0)
Hemoglobin: 12.8 g/dL (ref 12.0–15.0)
Immature Granulocytes: 0 %
Lymphocytes Relative: 16 %
Lymphs Abs: 1.2 10*3/uL (ref 0.7–4.0)
MCH: 29.6 pg (ref 26.0–34.0)
MCHC: 33.5 g/dL (ref 30.0–36.0)
MCV: 88.4 fL (ref 80.0–100.0)
Monocytes Absolute: 0.7 10*3/uL (ref 0.1–1.0)
Monocytes Relative: 10 %
Neutro Abs: 5.2 10*3/uL (ref 1.7–7.7)
Neutrophils Relative %: 70 %
Platelets: 242 10*3/uL (ref 150–400)
RBC: 4.32 MIL/uL (ref 3.87–5.11)
RDW: 13.2 % (ref 11.5–15.5)
WBC: 7.4 10*3/uL (ref 4.0–10.5)
nRBC: 0 % (ref 0.0–0.2)

## 2019-07-09 LAB — COMPREHENSIVE METABOLIC PANEL
ALT: 24 U/L (ref 0–44)
AST: 21 U/L (ref 15–41)
Albumin: 4.3 g/dL (ref 3.5–5.0)
Alkaline Phosphatase: 155 U/L — ABNORMAL HIGH (ref 38–126)
Anion gap: 11 (ref 5–15)
BUN: 24 mg/dL — ABNORMAL HIGH (ref 8–23)
CO2: 27 mmol/L (ref 22–32)
Calcium: 9.7 mg/dL (ref 8.9–10.3)
Chloride: 100 mmol/L (ref 98–111)
Creatinine, Ser: 1.25 mg/dL — ABNORMAL HIGH (ref 0.44–1.00)
GFR calc Af Amer: 50 mL/min — ABNORMAL LOW (ref >60)
GFR calc non Af Amer: 44 mL/min — ABNORMAL LOW (ref >60)
Glucose, Bld: 104 mg/dL — ABNORMAL HIGH (ref 70–99)
Potassium: 4.1 mmol/L (ref 3.5–5.1)
Sodium: 138 mmol/L (ref 135–145)
Total Bilirubin: 0.7 mg/dL (ref 0.3–1.2)
Total Protein: 7.5 g/dL (ref 6.5–8.1)

## 2019-07-09 MED ORDER — FULVESTRANT 250 MG/5ML IM SOLN
500.0000 mg | Freq: Once | INTRAMUSCULAR | Status: AC
  Administered 2019-07-09: 500 mg via INTRAMUSCULAR
  Filled 2019-07-09: qty 10

## 2019-07-09 MED ORDER — DENOSUMAB 120 MG/1.7ML ~~LOC~~ SOLN
120.0000 mg | Freq: Once | SUBCUTANEOUS | Status: AC
  Administered 2019-07-09: 120 mg via SUBCUTANEOUS
  Filled 2019-07-09: qty 1.7

## 2019-07-09 NOTE — Progress Notes (Signed)
Patient education on xgeva and faslodex. Discussed side effects of these 2 injection medications.   Patient instructed by Nuala Alpha to take her ibrance starting today. Alyson also provided her with a print out of side effects of ibrance. Also provided patient with copy of MRI of brain per her request. Patient given printed literature about xgeva/faslodex. Teach back process performed.

## 2019-07-09 NOTE — Patient Instructions (Signed)
Denosumab injection What is this medicine? DENOSUMAB (den oh sue mab) slows bone breakdown. Prolia is used to treat osteoporosis in women after menopause and in men, and in people who are taking corticosteroids for 6 months or more. Xgeva is used to treat a high calcium level due to cancer and to prevent bone fractures and other bone problems caused by multiple myeloma or cancer bone metastases. Xgeva is also used to treat giant cell tumor of the bone. This medicine may be used for other purposes; ask your health care provider or pharmacist if you have questions. COMMON BRAND NAME(S): Prolia, XGEVA What should I tell my health care provider before I take this medicine? They need to know if you have any of these conditions:  dental disease  having surgery or tooth extraction  infection  kidney disease  low levels of calcium or Vitamin D in the blood  malnutrition  on hemodialysis  skin conditions or sensitivity  thyroid or parathyroid disease  an unusual reaction to denosumab, other medicines, foods, dyes, or preservatives  pregnant or trying to get pregnant  breast-feeding How should I use this medicine? This medicine is for injection under the skin. It is given by a health care professional in a hospital or clinic setting. A special MedGuide will be given to you before each treatment. Be sure to read this information carefully each time. For Prolia, talk to your pediatrician regarding the use of this medicine in children. Special care may be needed. For Xgeva, talk to your pediatrician regarding the use of this medicine in children. While this drug may be prescribed for children as young as 13 years for selected conditions, precautions do apply. Overdosage: If you think you have taken too much of this medicine contact a poison control center or emergency room at once. NOTE: This medicine is only for you. Do not share this medicine with others. What if I miss a dose? It is  important not to miss your dose. Call your doctor or health care professional if you are unable to keep an appointment. What may interact with this medicine? Do not take this medicine with any of the following medications:  other medicines containing denosumab This medicine may also interact with the following medications:  medicines that lower your chance of fighting infection  steroid medicines like prednisone or cortisone This list may not describe all possible interactions. Give your health care provider a list of all the medicines, herbs, non-prescription drugs, or dietary supplements you use. Also tell them if you smoke, drink alcohol, or use illegal drugs. Some items may interact with your medicine. What should I watch for while using this medicine? Visit your doctor or health care professional for regular checks on your progress. Your doctor or health care professional may order blood tests and other tests to see how you are doing. Call your doctor or health care professional for advice if you get a fever, chills or sore throat, or other symptoms of a cold or flu. Do not treat yourself. This drug may decrease your body's ability to fight infection. Try to avoid being around people who are sick. You should make sure you get enough calcium and vitamin D while you are taking this medicine, unless your doctor tells you not to. Discuss the foods you eat and the vitamins you take with your health care professional. See your dentist regularly. Brush and floss your teeth as directed. Before you have any dental work done, tell your dentist you are   receiving this medicine. Do not become pregnant while taking this medicine or for 5 months after stopping it. Talk with your doctor or health care professional about your birth control options while taking this medicine. Women should inform their doctor if they wish to become pregnant or think they might be pregnant. There is a potential for serious side  effects to an unborn child. Talk to your health care professional or pharmacist for more information. What side effects may I notice from receiving this medicine? Side effects that you should report to your doctor or health care professional as soon as possible:  allergic reactions like skin rash, itching or hives, swelling of the face, lips, or tongue  bone pain  breathing problems  dizziness  jaw pain, especially after dental work  redness, blistering, peeling of the skin  signs and symptoms of infection like fever or chills; cough; sore throat; pain or trouble passing urine  signs of low calcium like fast heartbeat, muscle cramps or muscle pain; pain, tingling, numbness in the hands or feet; seizures  unusual bleeding or bruising  unusually weak or tired Side effects that usually do not require medical attention (report to your doctor or health care professional if they continue or are bothersome):  constipation  diarrhea  headache  joint pain  loss of appetite  muscle pain  runny nose  tiredness  upset stomach This list may not describe all possible side effects. Call your doctor for medical advice about side effects. You may report side effects to FDA at 1-800-FDA-1088. Where should I keep my medicine? This medicine is only given in a clinic, doctor's office, or other health care setting and will not be stored at home. NOTE: This sheet is a summary. It may not cover all possible information. If you have questions about this medicine, talk to your doctor, pharmacist, or health care provider.  2020 Elsevier/Gold Standard (2017-05-10 16:10:44) Fulvestrant injection What is this medicine? FULVESTRANT (ful VES trant) blocks the effects of estrogen. It is used to treat breast cancer. This medicine may be used for other purposes; ask your health care provider or pharmacist if you have questions. COMMON BRAND NAME(S): FASLODEX What should I tell my health care  provider before I take this medicine? They need to know if you have any of these conditions:  bleeding disorders  liver disease  low blood counts, like low white cell, platelet, or red cell counts  an unusual or allergic reaction to fulvestrant, other medicines, foods, dyes, or preservatives  pregnant or trying to get pregnant  breast-feeding How should I use this medicine? This medicine is for injection into a muscle. It is usually given by a health care professional in a hospital or clinic setting. Talk to your pediatrician regarding the use of this medicine in children. Special care may be needed. Overdosage: If you think you have taken too much of this medicine contact a poison control center or emergency room at once. NOTE: This medicine is only for you. Do not share this medicine with others. What if I miss a dose? It is important not to miss your dose. Call your doctor or health care professional if you are unable to keep an appointment. What may interact with this medicine?  medicines that treat or prevent blood clots like warfarin, enoxaparin, dalteparin, apixaban, dabigatran, and rivaroxaban This list may not describe all possible interactions. Give your health care provider a list of all the medicines, herbs, non-prescription drugs, or dietary supplements you use.   Also tell them if you smoke, drink alcohol, or use illegal drugs. Some items may interact with your medicine. What should I watch for while using this medicine? Your condition will be monitored carefully while you are receiving this medicine. You will need important blood work done while you are taking this medicine. Do not become pregnant while taking this medicine or for at least 1 year after stopping it. Women of child-bearing potential will need to have a negative pregnancy test before starting this medicine. Women should inform their doctor if they wish to become pregnant or think they might be pregnant. There is  a potential for serious side effects to an unborn child. Men should inform their doctors if they wish to father a child. This medicine may lower sperm counts. Talk to your health care professional or pharmacist for more information. Do not breast-feed an infant while taking this medicine or for 1 year after the last dose. What side effects may I notice from receiving this medicine? Side effects that you should report to your doctor or health care professional as soon as possible:  allergic reactions like skin rash, itching or hives, swelling of the face, lips, or tongue  feeling faint or lightheaded, falls  pain, tingling, numbness, or weakness in the legs  signs and symptoms of infection like fever or chills; cough; flu-like symptoms; sore throat  vaginal bleeding Side effects that usually do not require medical attention (report to your doctor or health care professional if they continue or are bothersome):  aches, pains  constipation  diarrhea  headache  hot flashes  nausea, vomiting  pain at site where injected  stomach pain This list may not describe all possible side effects. Call your doctor for medical advice about side effects. You may report side effects to FDA at 1-800-FDA-1088. Where should I keep my medicine? This drug is given in a hospital or clinic and will not be stored at home. NOTE: This sheet is a summary. It may not cover all possible information. If you have questions about this medicine, talk to your doctor, pharmacist, or health care provider.  2020 Elsevier/Gold Standard (2017-04-11 11:34:41)  

## 2019-07-10 ENCOUNTER — Ambulatory Visit: Payer: Medicare Other

## 2019-07-10 DIAGNOSIS — Z510 Encounter for antineoplastic radiation therapy: Secondary | ICD-10-CM | POA: Diagnosis not present

## 2019-07-13 ENCOUNTER — Ambulatory Visit
Admission: RE | Admit: 2019-07-13 | Discharge: 2019-07-13 | Disposition: A | Discharge status: Home / Self Care | Payer: Medicare Other | Source: Ambulatory Visit | Attending: Radiation Oncology | Admitting: Radiation Oncology

## 2019-07-13 DIAGNOSIS — Z510 Encounter for antineoplastic radiation therapy: Secondary | ICD-10-CM | POA: Diagnosis not present

## 2019-07-14 ENCOUNTER — Ambulatory Visit
Admission: RE | Admit: 2019-07-14 | Discharge: 2019-07-14 | Disposition: A | Discharge status: Home / Self Care | Payer: Medicare Other | Source: Ambulatory Visit | Attending: Radiation Oncology | Admitting: Radiation Oncology

## 2019-07-14 DIAGNOSIS — Z510 Encounter for antineoplastic radiation therapy: Secondary | ICD-10-CM | POA: Diagnosis not present

## 2019-07-15 ENCOUNTER — Ambulatory Visit
Admission: RE | Admit: 2019-07-15 | Discharge: 2019-07-15 | Disposition: A | Discharge status: Home / Self Care | Payer: Medicare Other | Source: Ambulatory Visit | Attending: Radiation Oncology | Admitting: Radiation Oncology

## 2019-07-15 DIAGNOSIS — Z510 Encounter for antineoplastic radiation therapy: Secondary | ICD-10-CM | POA: Diagnosis not present

## 2019-07-15 NOTE — Telephone Encounter (Signed)
Oral Oncology Patient Advocate Encounter  Received notification from [place] Patient Assistance program that patient has been successfully enrolled into their program to receive Ibrance from the manufacturer at $0 out of pocket until 01/15/20.    I called and spoke with patient.  She knows we will have to re-apply.   Specialty Pharmacy that will dispense medication is [facility].  Patient must call [employer] at [phone] 7-10 days prior to needing their next refill.  Patient knows to call the office with questions or concerns.   Oral Oncology Clinic will continue to follow.  [place] [facility] [place] Phone [phone] Fax [phone] 07/15/2019 11:31 AM

## 2019-07-16 ENCOUNTER — Ambulatory Visit
Admission: RE | Admit: 2019-07-16 | Discharge: 2019-07-16 | Disposition: A | Discharge status: Home / Self Care | Payer: Medicare Other | Source: Ambulatory Visit | Attending: Radiation Oncology | Admitting: Radiation Oncology

## 2019-07-16 DIAGNOSIS — C7951 Secondary malignant neoplasm of bone: Secondary | ICD-10-CM | POA: Diagnosis not present

## 2019-07-16 DIAGNOSIS — Z170 Estrogen receptor positive status [ER+]: Secondary | ICD-10-CM | POA: Insufficient documentation

## 2019-07-16 DIAGNOSIS — Z510 Encounter for antineoplastic radiation therapy: Secondary | ICD-10-CM | POA: Insufficient documentation

## 2019-07-16 DIAGNOSIS — C50912 Malignant neoplasm of unspecified site of left female breast: Secondary | ICD-10-CM | POA: Insufficient documentation

## 2019-07-17 ENCOUNTER — Ambulatory Visit
Admission: RE | Admit: 2019-07-17 | Discharge: 2019-07-17 | Disposition: A | Discharge status: Home / Self Care | Payer: Medicare Other | Source: Ambulatory Visit | Attending: Radiation Oncology | Admitting: Radiation Oncology

## 2019-07-17 DIAGNOSIS — Z510 Encounter for antineoplastic radiation therapy: Secondary | ICD-10-CM | POA: Diagnosis not present

## 2019-07-17 DIAGNOSIS — C7951 Secondary malignant neoplasm of bone: Secondary | ICD-10-CM | POA: Diagnosis not present

## 2019-07-17 DIAGNOSIS — Z170 Estrogen receptor positive status [ER+]: Secondary | ICD-10-CM | POA: Diagnosis not present

## 2019-07-17 DIAGNOSIS — C50912 Malignant neoplasm of unspecified site of left female breast: Secondary | ICD-10-CM | POA: Diagnosis not present

## 2019-07-21 ENCOUNTER — Ambulatory Visit
Admission: RE | Admit: 2019-07-21 | Discharge: 2019-07-21 | Disposition: A | Discharge status: Home / Self Care | Payer: Medicare Other | Source: Ambulatory Visit | Attending: Radiation Oncology | Admitting: Radiation Oncology

## 2019-07-21 DIAGNOSIS — C50912 Malignant neoplasm of unspecified site of left female breast: Secondary | ICD-10-CM | POA: Diagnosis not present

## 2019-07-21 DIAGNOSIS — C7951 Secondary malignant neoplasm of bone: Secondary | ICD-10-CM | POA: Diagnosis not present

## 2019-07-21 DIAGNOSIS — Z170 Estrogen receptor positive status [ER+]: Secondary | ICD-10-CM | POA: Diagnosis not present

## 2019-07-21 DIAGNOSIS — Z510 Encounter for antineoplastic radiation therapy: Secondary | ICD-10-CM | POA: Diagnosis not present

## 2019-07-22 ENCOUNTER — Ambulatory Visit
Admission: RE | Admit: 2019-07-22 | Discharge: 2019-07-22 | Disposition: A | Discharge status: Home / Self Care | Payer: Medicare Other | Source: Ambulatory Visit | Attending: Radiation Oncology | Admitting: Radiation Oncology

## 2019-07-22 DIAGNOSIS — C50912 Malignant neoplasm of unspecified site of left female breast: Secondary | ICD-10-CM | POA: Diagnosis not present

## 2019-07-22 DIAGNOSIS — Z170 Estrogen receptor positive status [ER+]: Secondary | ICD-10-CM | POA: Diagnosis not present

## 2019-07-22 DIAGNOSIS — C7951 Secondary malignant neoplasm of bone: Secondary | ICD-10-CM | POA: Diagnosis not present

## 2019-07-22 DIAGNOSIS — Z510 Encounter for antineoplastic radiation therapy: Secondary | ICD-10-CM | POA: Diagnosis not present

## 2019-07-23 ENCOUNTER — Inpatient Hospital Stay: Payer: Medicare Other | Attending: Oncology

## 2019-07-23 ENCOUNTER — Ambulatory Visit
Admission: RE | Admit: 2019-07-23 | Discharge: 2019-07-23 | Disposition: A | Discharge status: Home / Self Care | Payer: Medicare Other | Source: Ambulatory Visit | Attending: Radiation Oncology | Admitting: Radiation Oncology

## 2019-07-23 ENCOUNTER — Other Ambulatory Visit: Payer: Self-pay

## 2019-07-23 DIAGNOSIS — Z510 Encounter for antineoplastic radiation therapy: Secondary | ICD-10-CM | POA: Diagnosis not present

## 2019-07-23 DIAGNOSIS — Z5111 Encounter for antineoplastic chemotherapy: Secondary | ICD-10-CM | POA: Insufficient documentation

## 2019-07-23 DIAGNOSIS — C7951 Secondary malignant neoplasm of bone: Secondary | ICD-10-CM | POA: Insufficient documentation

## 2019-07-23 DIAGNOSIS — C50412 Malignant neoplasm of upper-outer quadrant of left female breast: Secondary | ICD-10-CM | POA: Insufficient documentation

## 2019-07-23 DIAGNOSIS — C50912 Malignant neoplasm of unspecified site of left female breast: Secondary | ICD-10-CM

## 2019-07-23 DIAGNOSIS — Z170 Estrogen receptor positive status [ER+]: Secondary | ICD-10-CM | POA: Diagnosis not present

## 2019-07-23 MED ORDER — FULVESTRANT 250 MG/5ML IM SOLN
500.0000 mg | Freq: Once | INTRAMUSCULAR | Status: AC
  Administered 2019-07-23: 500 mg via INTRAMUSCULAR
  Filled 2019-07-23: qty 10

## 2019-07-30 ENCOUNTER — Ambulatory Visit
Admission: RE | Admit: 2019-07-30 | Discharge: 2019-07-30 | Disposition: A | Discharge status: Home / Self Care | Payer: Medicare Other | Source: Ambulatory Visit | Attending: Radiation Oncology | Admitting: Radiation Oncology

## 2019-07-30 ENCOUNTER — Other Ambulatory Visit: Payer: Self-pay

## 2019-07-30 VITALS — BP 124/77 | HR 65 | Temp 95.7°F | Wt 224.0 lb

## 2019-07-30 DIAGNOSIS — C7951 Secondary malignant neoplasm of bone: Secondary | ICD-10-CM | POA: Insufficient documentation

## 2019-07-30 DIAGNOSIS — Z170 Estrogen receptor positive status [ER+]: Secondary | ICD-10-CM | POA: Insufficient documentation

## 2019-07-30 DIAGNOSIS — C50412 Malignant neoplasm of upper-outer quadrant of left female breast: Secondary | ICD-10-CM | POA: Insufficient documentation

## 2019-07-30 NOTE — Progress Notes (Signed)
Radiation Oncology Follow up Note  Name: Alicia [name]   Date:   07/30/2019 MRN:  [id number] DOB: 03/28/1950    This 69 y.o. female presents to the clinic today for 1 week follow-up status post palliative radiation therapy to her left hip.  And patient with known stage IV invasive mammary carcinoma.  REFERRING PROVIDER: [name], MD  HPI: Patient is a 69 year old female now at 1 month having completed palliative radiation therapy.  1 week prior.  She is had a good palliative benefit from the radiation therapy to her left hip we here to discuss her left shoulder which shows hypermetabolic activity on the PET CT scan both of the scapula on the left as well as her left humerus.  She is having pain on range of motion of her left shoulder.  She is having no significant residual side effects from her radiation therapy. COMPLICATIONS OF TREATMENT: none  FOLLOW UP COMPLIANCE: keeps appointments   PHYSICAL EXAM:  BP 124/77   Pulse 65   Temp (!) 95.7 F (35.4 C) (Tympanic)   Wt 224 lb (101.6 kg)   BMI 34.06 kg/m  Range of motion of her left upper extremity does elicit pain.  Well-developed well-nourished patient in NAD. HEENT reveals PERLA, EOMI, discs not visualized.  Oral cavity is clear. No oral mucosal lesions are identified. Neck is clear without evidence of cervical or supraclavicular adenopathy. Lungs are clear to A&P. Cardiac examination is essentially unremarkable with regular rate and rhythm without murmur rub or thrill. Abdomen is benign with no organomegaly or masses noted. Motor sensory and DTR levels are equal and symmetric in the upper and lower extremities. Cranial nerves II through XII are grossly intact. Proprioception is intact. No peripheral adenopathy or edema is identified. No motor or sensory levels are noted. Crude visual fields are within normal range.  RADIOLOGY RESULTS: PET CT scans reviewed  PLAN: Present time elected ahead with a single fraction of 800  centigrade to her both her left humerus as well as the area of left scapula.  I would use PET fusion study for treatment planning.  Risks and benefits of treatment including skin reaction fatigue were discussed with the patient.  I personally set up and ordered CT simulation for next week.  Patient comprehends my recommendation well.  I would like to take this opportunity to thank you for allowing me to participate in the care of your patient.Noreene Filbert, MD

## 2019-08-04 ENCOUNTER — Ambulatory Visit
Admission: RE | Admit: 2019-08-04 | Discharge: 2019-08-04 | Disposition: A | Discharge status: Home / Self Care | Payer: Medicare Other | Source: Ambulatory Visit | Attending: Radiation Oncology | Admitting: Radiation Oncology

## 2019-08-04 DIAGNOSIS — C7951 Secondary malignant neoplasm of bone: Secondary | ICD-10-CM | POA: Diagnosis not present

## 2019-08-04 DIAGNOSIS — C50912 Malignant neoplasm of unspecified site of left female breast: Secondary | ICD-10-CM | POA: Diagnosis not present

## 2019-08-04 DIAGNOSIS — C50412 Malignant neoplasm of upper-outer quadrant of left female breast: Secondary | ICD-10-CM | POA: Diagnosis not present

## 2019-08-04 DIAGNOSIS — Z170 Estrogen receptor positive status [ER+]: Secondary | ICD-10-CM | POA: Diagnosis not present

## 2019-08-04 DIAGNOSIS — Z510 Encounter for antineoplastic radiation therapy: Secondary | ICD-10-CM | POA: Diagnosis not present

## 2019-08-06 ENCOUNTER — Inpatient Hospital Stay (HOSPITAL_BASED_OUTPATIENT_CLINIC_OR_DEPARTMENT_OTHER): Payer: Medicare Other | Admitting: Nurse Practitioner

## 2019-08-06 ENCOUNTER — Inpatient Hospital Stay: Payer: Medicare Other

## 2019-08-06 ENCOUNTER — Encounter: Payer: Self-pay | Admitting: Nurse Practitioner

## 2019-08-06 ENCOUNTER — Inpatient Hospital Stay: Payer: Medicare Other | Admitting: Pharmacist

## 2019-08-06 ENCOUNTER — Ambulatory Visit
Admission: RE | Admit: 2019-08-06 | Discharge: 2019-08-06 | Disposition: A | Discharge status: Home / Self Care | Payer: Medicare Other | Source: Ambulatory Visit | Attending: Radiation Oncology | Admitting: Radiation Oncology

## 2019-08-06 ENCOUNTER — Other Ambulatory Visit: Payer: Self-pay

## 2019-08-06 VITALS — BP 132/65 | HR 70 | Temp 97.6°F | Resp 18 | Wt 225.0 lb

## 2019-08-06 DIAGNOSIS — M8580 Other specified disorders of bone density and structure, unspecified site: Secondary | ICD-10-CM | POA: Diagnosis not present

## 2019-08-06 DIAGNOSIS — C50919 Malignant neoplasm of unspecified site of unspecified female breast: Secondary | ICD-10-CM

## 2019-08-06 DIAGNOSIS — Z170 Estrogen receptor positive status [ER+]: Secondary | ICD-10-CM | POA: Diagnosis not present

## 2019-08-06 DIAGNOSIS — Z5111 Encounter for antineoplastic chemotherapy: Secondary | ICD-10-CM | POA: Diagnosis not present

## 2019-08-06 DIAGNOSIS — C7951 Secondary malignant neoplasm of bone: Secondary | ICD-10-CM | POA: Diagnosis not present

## 2019-08-06 DIAGNOSIS — C50912 Malignant neoplasm of unspecified site of left female breast: Secondary | ICD-10-CM | POA: Diagnosis not present

## 2019-08-06 DIAGNOSIS — Z510 Encounter for antineoplastic radiation therapy: Secondary | ICD-10-CM | POA: Diagnosis not present

## 2019-08-06 DIAGNOSIS — C50412 Malignant neoplasm of upper-outer quadrant of left female breast: Secondary | ICD-10-CM | POA: Diagnosis not present

## 2019-08-06 LAB — COMPREHENSIVE METABOLIC PANEL
ALT: 41 U/L (ref 0–44)
AST: 36 U/L (ref 15–41)
Albumin: 4.4 g/dL (ref 3.5–5.0)
Alkaline Phosphatase: 122 U/L (ref 38–126)
Anion gap: 9 (ref 5–15)
BUN: 26 mg/dL — ABNORMAL HIGH (ref 8–23)
CO2: 23 mmol/L (ref 22–32)
Calcium: 9.1 mg/dL (ref 8.9–10.3)
Chloride: 101 mmol/L (ref 98–111)
Creatinine, Ser: 0.98 mg/dL (ref 0.44–1.00)
GFR calc Af Amer: >60 mL/min (ref >60)
GFR calc non Af Amer: 58 mL/min — ABNORMAL LOW (ref >60)
Glucose, Bld: 102 mg/dL — ABNORMAL HIGH (ref 70–99)
Potassium: 4.1 mmol/L (ref 3.5–5.1)
Sodium: 133 mmol/L — ABNORMAL LOW (ref 135–145)
Total Bilirubin: 0.7 mg/dL (ref 0.3–1.2)
Total Protein: 7.6 g/dL (ref 6.5–8.1)

## 2019-08-06 LAB — CBC WITH DIFFERENTIAL/PLATELET
Abs Immature Granulocytes: 0.02 10*3/uL (ref 0.00–0.07)
Basophils Absolute: 0.1 10*3/uL (ref 0.0–0.1)
Basophils Relative: 2 %
Eosinophils Absolute: 0 10*3/uL (ref 0.0–0.5)
Eosinophils Relative: 1 %
HCT: 34.6 % — ABNORMAL LOW (ref 36.0–46.0)
Hemoglobin: 11.9 g/dL — ABNORMAL LOW (ref 12.0–15.0)
Immature Granulocytes: 1 %
Lymphocytes Relative: 20 %
Lymphs Abs: 0.6 10*3/uL — ABNORMAL LOW (ref 0.7–4.0)
MCH: 31.3 pg (ref 26.0–34.0)
MCHC: 34.4 g/dL (ref 30.0–36.0)
MCV: 91.1 fL (ref 80.0–100.0)
Monocytes Absolute: 0.5 10*3/uL (ref 0.1–1.0)
Monocytes Relative: 17 %
Neutro Abs: 1.9 10*3/uL (ref 1.7–7.7)
Neutrophils Relative %: 59 %
Platelets: 180 10*3/uL (ref 150–400)
RBC: 3.8 MIL/uL — ABNORMAL LOW (ref 3.87–5.11)
RDW: 15.8 % — ABNORMAL HIGH (ref 11.5–15.5)
WBC: 3.2 10*3/uL — ABNORMAL LOW (ref 4.0–10.5)
nRBC: 0 % (ref 0.0–0.2)

## 2019-08-06 MED ORDER — FULVESTRANT 250 MG/5ML IM SOLN
500.0000 mg | Freq: Once | INTRAMUSCULAR | Status: AC
  Administered 2019-08-06: 500 mg via INTRAMUSCULAR
  Filled 2019-08-06: qty 10

## 2019-08-06 MED ORDER — DENOSUMAB 120 MG/1.7ML ~~LOC~~ SOLN
120.0000 mg | Freq: Once | SUBCUTANEOUS | Status: AC
  Administered 2019-08-06: 120 mg via SUBCUTANEOUS
  Filled 2019-08-06: qty 1.7

## 2019-08-06 NOTE — Progress Notes (Signed)
[place]  Telephone:([phone] [phone]  Patient Care Team: [name], MD as PCP - General (Internal Medicine) Rico Junker, RN as Registered Nurse Lloyd Huger, MD as Consulting Physician (Oncology) Bary Castilla Forest Gleason, MD as Consulting Physician (General Surgery) Bary Castilla Forest Gleason, MD as Consulting Physician (General Surgery) Noreene Filbert, MD as Referring Physician (Radiation Oncology)   Name of the patient: Alicia [name]  [id number]  Dec 04, 1950   Date of visit: 08/06/19  HPI: Patient is a 69 y.o. female with recurrent stage IV ER/PR positive, HER2 negative breast cancer. Patient started on Ibrance on 07/09/19.  Reason for Consult: Oral chemotherapy follow-up for Ibrance (palbociclib) therapy, s/p one cycle.   PAST MEDICAL HISTORY: Past Medical History:  Diagnosis Date  . Anxiety   . Cancer ([place]) 07/2017   left breast  . Depression   . Hypertension   . Hypothyroidism   . Personal history of chemotherapy 2019   LEFT mastectomy-chemo before  . Personal history of radiation therapy 03/2018   LEFT mastectomy  . Rapid heart rate   . Thyroid disease     PAST SURGICAL HISTORY:  Past Surgical History:  Procedure Laterality Date  . AXILLARY LYMPH NODE BIOPSY Left 07/16/2017   METASTATIC MAMMARY CARCINOMA  . BREAST BIOPSY Left 07/16/2017   [place] bx of left breast mass and left breast LN.  INVASIVE MAMMARY CARCINOMA, NO SPECIAL TYPE.   Marland Kitchen BREAST EXCISIONAL BIOPSY Right 2001   benign  . BREAST LUMPECTOMY WITH SENTINEL LYMPH NODE BIOPSY Left 12/06/2017   Procedure: BREAST LUMPECTOMY WITH SENTINEL LYMPH NODE BX;  Surgeon: Robert Bellow, MD;  Location: [facility] ORS;  Service: General;  Laterality: Left;  . COLONOSCOPY    . MASTECTOMY Left 12/23/2017  . PORTACATH PLACEMENT Right 08/07/2017   Procedure: INSERTION PORT-A-CATH;  Surgeon: Robert Bellow, MD;  Location: [facility] ORS;  Service: General;   Laterality: Right;  . SIMPLE MASTECTOMY WITH AXILLARY SENTINEL NODE BIOPSY Left 12/23/2017   T2,N2 with 6/7 nodes positive. Whole breast radiation.  Surgeon: Robert Bellow, MD;  Location: [facility] ORS;  Service: General;  Laterality: Left;  . TONSILLECTOMY      HEMATOLOGY/ONCOLOGY HISTORY:  Oncology History Overview Note  Patient is a 69 year old female who recently self palpated a mass on her left breast.  Subsequent imaging and biopsy revealed the above-stated breast cancer.  Case was also discussed extensively at case conference.  Given the size and the stage of patient's malignancy, she will benefit from neoadjuvant chemotherapy using Adriamycin, Cytoxan, and Taxol.  Patient will also require Neulasta support.  Will get CT scan of the chest, abdomen, and pelvis to assess for any metastatic disease.  Patient will also require port placement and MUGA prior to initiating treatment  CT abdomen/pelvis/chest did not reveal any suspicious lesions concerning for metastatic disease. (08/01/17)  Port-A-Cath placed on 08/07/2017.  Cycle 1 day 1 of AC was given on 08/08/17.     Breast cancer, stage 2, left ([place])  07/24/2017 Initial Diagnosis   Breast cancer, stage 2, left ([place])   07/30/2017 Cancer Staging   Staging form: Breast, AJCC 8th Edition - Clinical stage from 07/30/2017: Stage IIA (cT2, cN1, cM0, G2, ER+, PR+, HER2-) - Signed by Lloyd Huger, MD on 07/30/2017   08/08/2017 - 10/31/2017 Chemotherapy   The patient had DOXOrubicin (ADRIAMYCIN) chemo injection 130 mg, 60 mg/m2 = 130 mg, Intravenous,  Once, 4 of 4 cycles Administration: 130 mg (08/08/2017), 130 mg (  08/22/2017), 130 mg (09/05/2017), 130 mg (09/19/2017) palonosetron (ALOXI) injection 0.25 mg, 0.25 mg, Intravenous,  Once, 4 of 4 cycles Administration: 0.25 mg (08/08/2017), 0.25 mg (08/22/2017), 0.25 mg (09/05/2017), 0.25 mg (09/19/2017) pegfilgrastim-cbqv (UDENYCA) injection 6 mg, 6 mg, Subcutaneous, Once, 4 of 4 cycles Administration: 6 mg  (08/09/2017), 6 mg (08/23/2017), 6 mg (09/06/2017), 6 mg (09/20/2017) cyclophosphamide (CYTOXAN) 1,300 mg in sodium chloride 0.9 % 250 mL chemo infusion, 600 mg/m2 = 1,300 mg, Intravenous,  Once, 4 of 4 cycles Administration: 1,300 mg (08/08/2017), 1,300 mg (08/22/2017), 1,300 mg (09/05/2017), 1,300 mg (09/19/2017) PACLitaxel (TAXOL) 174 mg in sodium chloride 0.9 % 250 mL chemo infusion (</= '80mg'$ /m2), 80 mg/m2 = 174 mg, Intravenous,  Once, 4 of 12 cycles Dose modification: 72 mg/m2 (original dose 80 mg/m2, Cycle 6, Reason: Dose not tolerated) Administration: 174 mg (10/03/2017), 156 mg (10/17/2017), 156 mg (10/24/2017), 156 mg (10/31/2017) fosaprepitant (EMEND) 150 mg, dexamethasone (DECADRON) 12 mg in sodium chloride 0.9 % 145 mL IVPB, , Intravenous,  Once, 4 of 4 cycles Administration:  (08/08/2017),  (08/22/2017),  (09/05/2017),  (09/19/2017)  for chemotherapy treatment.      ALLERGIES:  is allergic to sulfa antibiotics.  MEDICATIONS:  Current Outpatient Medications  Medication Sig Dispense Refill  . acetaminophen (TYLENOL) 500 MG tablet Take 1,000 mg by mouth every 6 (six) hours as needed for moderate pain or headache.     . albuterol (PROVENTIL HFA;VENTOLIN HFA) 108 (90 Base) MCG/ACT inhaler Inhale 2 puffs into the lungs every 6 (six) hours as needed for wheezing or shortness of breath. 1 Inhaler 2  . ALPRAZolam (XANAX) 0.25 MG tablet Take 1 tablet (0.25 mg total) by mouth 2 (two) times daily. 30 tablet 2  . amLODipine (NORVASC) 5 MG tablet TAKE 1 TABLET BY MOUTH DAILY 90 tablet 3  . anastrozole (ARIMIDEX) 1 MG tablet Take 1 tablet (1 mg total) by mouth daily. 90 tablet 3  . aspirin EC 81 MG tablet Take 81 mg by mouth at bedtime.     Marland Kitchen buPROPion (WELLBUTRIN SR) 150 MG 12 hr tablet Take 150 mg by mouth 2 (two) times daily.    . calcium carbonate (OS-CAL - DOSED IN MG OF ELEMENTAL CALCIUM) 1250 (500 Ca) MG tablet Take 1 tablet by mouth daily with breakfast.    . calcium-vitamin D (OSCAL WITH D) 250-125  MG-UNIT tablet Take 1 tablet by mouth 2 (two) times daily.    . Cholecalciferol (VITAMIN D3) 125 MCG (5000 UT) CAPS Take 1 capsule by mouth 2 (two) times a day.    . letrozole (FEMARA) 2.5 MG tablet Take 1 tablet (2.5 mg total) by mouth daily. 30 tablet 3  . levothyroxine (SYNTHROID, LEVOTHROID) 100 MCG tablet Take 100 mcg by mouth daily before breakfast.    . losartan-hydrochlorothiazide (HYZAAR) 100-12.5 MG tablet Take 1 tablet by mouth daily.    . Magnesium Carbonate Heavy POWD by Does not apply route. Natural Vitality Calm magnesium supplement    . ondansetron (ZOFRAN) 8 MG tablet Take 1 tablet (8 mg total) by mouth every 8 (eight) hours as needed for nausea or vomiting. 20 tablet 0  . palbociclib (IBRANCE) 125 MG tablet Take 1 tablet (125 mg total) by mouth daily. Take for 21 days on, 7 days off, repeat every 28 days. 21 tablet 0  . rosuvastatin (CRESTOR) 5 MG tablet TAKE 1 TABLET BY MOUTH DAILY 30 tablet 6  . traZODone (DESYREL) 50 MG tablet TAKE 1 TABLET BY MOUTH DAILY 30 tablet 6  .  warfarin (COUMADIN) 5 MG tablet Take 5 mg by mouth daily.     No current facility-administered medications for this visit.    VITAL SIGNS: There were no vitals taken for this visit. There were no vitals filed for this visit.  Estimated body mass index is 34.06 kg/m as calculated from the following:   Height as of 04/01/18: 5' 8"  (1.727 m).   Weight as of 07/30/19: 101.6 kg (224 lb).  LABS: CBC:    Component Value Date/Time   WBC 7.4 07/09/2019 1328   HGB 12.8 07/09/2019 1328   HGB 14.2 02/11/2011 1349   HCT 38.2 07/09/2019 1328   HCT 40.9 02/11/2011 1349   PLT 242 07/09/2019 1328   PLT 151 02/11/2011 1349   MCV 88.4 07/09/2019 1328   MCV 89 02/11/2011 1349   NEUTROABS 5.2 07/09/2019 1328   LYMPHSABS 1.2 07/09/2019 1328   MONOABS 0.7 07/09/2019 1328   EOSABS 0.2 07/09/2019 1328   BASOSABS 0.1 07/09/2019 1328   Comprehensive Metabolic Panel:    Component Value Date/Time   NA 138 07/09/2019  1328   NA 142 02/11/2011 1349   K 4.1 07/09/2019 1328   K 3.8 02/11/2011 1349   CL 100 07/09/2019 1328   CL 107 02/11/2011 1349   CO2 27 07/09/2019 1328   CO2 24 02/11/2011 1349   BUN 24 (H) 07/09/2019 1328   BUN 14 02/11/2011 1349   CREATININE 1.25 (H) 07/09/2019 1328   CREATININE 0.65 02/11/2011 1349   GLUCOSE 104 (H) 07/09/2019 1328   GLUCOSE 98 02/11/2011 1349   CALCIUM 9.7 07/09/2019 1328   CALCIUM 9.0 02/11/2011 1349   AST 21 07/09/2019 1328   AST 27 02/11/2011 1349   ALT 24 07/09/2019 1328   ALT 36 02/11/2011 1349   ALKPHOS 155 (H) 07/09/2019 1328   ALKPHOS 122 02/11/2011 1349   BILITOT 0.7 07/09/2019 1328   BILITOT 0.5 02/11/2011 1349   PROT 7.5 07/09/2019 1328   PROT 7.0 02/11/2011 1349   ALBUMIN 4.3 07/09/2019 1328   ALBUMIN 4.1 02/11/2011 1349    RADIOGRAPHIC STUDIES: MR Brain W Wo Contrast  Result Date: 07/08/2019 CLINICAL DATA:  Metastatic breast cancer. EXAM: MRI HEAD WITHOUT AND WITH CONTRAST TECHNIQUE: Multiplanar, multiecho pulse sequences of the brain and surrounding structures were obtained without and with intravenous contrast. CONTRAST:  50m GADAVIST GADOBUTROL 1 MMOL/ML IV SOLN COMPARISON:  None. FINDINGS: Brain: No acute infarction, hemorrhage, hydrocephalus, extra-axial collection or mass lesion. Few scattered foci of T2 hyperintensity are seen within the white matter of the cerebral hemispheres, nonspecific. Moderate parenchymal volume loss. No focus of abnormal contrast enhancement identified. Vascular: Normal flow voids. Skull and upper cervical spine: Normal marrow signal. Sinuses/Orbits: Mucous retention cyst in the left maxillary and frontal sinuses. Other: None. IMPRESSION: 1. No evidence of intracranial metastatic disease. 2. Few scattered foci of T2 hyperintensity within the white matter of the cerebral hemispheres, nonspecific but may represent mild chronic microvascular ischemic changes. 3. Moderate parenchymal volume loss. Electronically Signed    By: KPedro EarlsM.D.   On: 07/08/2019 15:41     Assessment and Plan-  Reviewed CBC plan to continue Ibrance at 1228mdosing. Patient starts cycle 2 tomorrow 08/07/19.   Oral Chemotherapy Side Effect/Intolerance:  -Dizzness/foggyness: Patient reports this comes and goes but feel that it started along with the Ibrance. She also reports trouble sleeping. Patient plans to increase her water consumption.  -Constipation: Patient reports taking medication at home for constipation that helps to  keep her regular (~1 bowel movement/day). She could not remember the name of the medication is is currently taking -Nausea: occasionally, she uses ondansetron as needed and it helps  Oral Chemotherapy Adherence: no reported missed doses  Medication Access Issues: Patient enrolled in [employer] patient assistance until the end of the year. She has received medication from [facility] to get started tomorrow on cycle 2.  Patient expressed understanding and was in agreement with this plan. She also understands that She can call clinic at any time with any questions, concerns, or complaints.   Thank you for allowing me to participate in the care of this very pleasant patient.   Time Total: 15 mins  Visit consisted of counseling and education on dealing with issues of symptom management in the setting of serious and potentially life-threatening illness.Greater than 50%  of this time was spent counseling and coordinating care related to the above assessment and plan.  Signed by: Darl Pikes, PharmD, BCPS, Salley Slaughter, CPP Hematology/Oncology Clinical Pharmacist Practitioner [facility] [phone]  08/06/2019 11:04 AM

## 2019-08-06 NOTE — Progress Notes (Signed)
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  She started first cycle of Ibrance on 07/09/2019. She is receiving palliative radiation for bony disease.  She reports some brain fog and fatigue but reports this is mild.  Concerned that her weight has been trending up.  Has intermittent dizziness.  Concerned her vitamin D level may be low.  She takes supplemental calcium and vitamin D.  No interval fevers or illness.  No chest pain, shortness of breath, cough or hemoptysis.  She denies nausea, vomiting, constipation, or diarrhea.  She denies urinary complaints.  No further specific complaints today.  REVIEW OF SYSTEMS:   Review of Systems  Constitutional: Positive for malaise/fatigue. Negative for fever and weight loss.  Respiratory: Negative.  Negative for cough and shortness of breath.   Cardiovascular: Negative.  Negative for chest pain and leg swelling.  Gastrointestinal: Negative.  Negative for abdominal pain, constipation and nausea.  Genitourinary: Negative.  Negative for dysuria and urgency.  Musculoskeletal: Positive for joint pain. Negative for back pain.  Skin: Negative.  Negative for rash.  Neurological: Positive for dizziness,  tingling and sensory change. Negative for focal weakness, weakness and headaches.  Psychiatric/Behavioral: The patient is nervous/anxious.   As per HPI. Otherwise, a complete review of systems is negative.  PAST MEDICAL HISTORY: Past Medical History:  Diagnosis Date  . Anxiety   . Cancer ([place]) 07/2017   left breast  . Depression   . Hypertension   . Hypothyroidism   . Personal history of chemotherapy 2019   LEFT mastectomy-chemo before  . Personal history of radiation therapy 03/2018   LEFT mastectomy  . Rapid heart rate   . Thyroid disease     PAST SURGICAL HISTORY: Past Surgical History:  Procedure Laterality Date  . AXILLARY LYMPH NODE BIOPSY Left 07/16/2017   METASTATIC MAMMARY CARCINOMA  . BREAST BIOPSY Left 07/16/2017   [place] bx of left breast mass and left breast LN.  INVASIVE MAMMARY CARCINOMA, NO SPECIAL TYPE.   Marland Kitchen BREAST EXCISIONAL BIOPSY Right 2001   benign  . BREAST LUMPECTOMY WITH SENTINEL LYMPH NODE BIOPSY Left 12/06/2017   Procedure: BREAST LUMPECTOMY WITH SENTINEL LYMPH NODE BX;  Surgeon: Robert Bellow, MD;  Location: [facility] ORS;  Service: General;  Laterality: Left;  . COLONOSCOPY    . MASTECTOMY Left 12/23/2017  . PORTACATH PLACEMENT Right 08/07/2017   Procedure: INSERTION PORT-A-CATH;  Surgeon: Robert Bellow, MD;  Location: [facility] ORS;  Service: General;  Laterality: Right;  . SIMPLE MASTECTOMY WITH AXILLARY SENTINEL NODE BIOPSY Left 12/23/2017   T2,N2 with 6/7 nodes positive. Whole breast radiation.  Surgeon: Robert Bellow, MD;  Location: [facility] ORS;  Service: General;  Laterality: Left;  . TONSILLECTOMY      FAMILY HISTORY: Family History  Problem Relation Age of Onset  . Stroke Mother   . Thyroid disease  Mother   . Renal Disease Mother   . Stroke Father   . Heart attack Father   . Sudden death Father 82       suicide  . Anuerysm Brother   . Breast cancer Neg Hx     ADVANCED DIRECTIVES (Y/N):  N  HEALTH MAINTENANCE: Social History    Tobacco Use  . Smoking status: Current Some Day Smoker    Packs/day: 0.25    Years: 30.00    Pack years: 7.50    Types: Cigarettes  . Smokeless tobacco: Never Used  Vaping Use  . Vaping Use: Never used  Substance Use Topics  . Alcohol use: Yes    Comment: occasional q 52mo  . Drug use: Never  She is accompanied today by her sister-in-law LTiaunnawhom she identifies as her support system.   Colonoscopy:  PAP:  Bone density:  Lipid panel:  Allergies  Allergen Reactions  . Sulfa Antibiotics Diarrhea    Current Outpatient Medications  Medication Sig Dispense Refill  . acetaminophen (TYLENOL) 500 MG tablet Take 1,000 mg by mouth every 6 (six) hours as needed for moderate pain or headache.     . albuterol (PROVENTIL HFA;VENTOLIN HFA) 108 (90 Base) MCG/ACT inhaler Inhale 2 puffs into the lungs every 6 (six) hours as needed for wheezing or shortness of breath. 1 Inhaler 2  . ALPRAZolam (XANAX) 0.25 MG tablet Take 1 tablet (0.25 mg total) by mouth 2 (two) times daily. 30 tablet 2  . amLODipine (NORVASC) 5 MG tablet TAKE 1 TABLET BY MOUTH DAILY 90 tablet 3  . aspirin EC 81 MG tablet Take 81 mg by mouth at bedtime.     .Marland KitchenbuPROPion (WELLBUTRIN SR) 150 MG 12 hr tablet Take 150 mg by mouth 2 (two) times daily.    . calcium carbonate (OS-CAL - DOSED IN MG OF ELEMENTAL CALCIUM) 1250 (500 Ca) MG tablet Take 1 tablet by mouth daily with breakfast.    . calcium-vitamin D (OSCAL WITH D) 250-125 MG-UNIT tablet Take 1 tablet by mouth 2 (two) times daily.    . Cholecalciferol (VITAMIN D3) 125 MCG (5000 UT) CAPS Take 1 capsule by mouth 2 (two) times a day.    . docusate sodium (COLACE) 100 MG capsule Take 100 mg by mouth 3 (three) times daily as needed for mild constipation.    .Marland Kitchenlevothyroxine (SYNTHROID, LEVOTHROID) 100 MCG tablet Take 100 mcg by mouth daily before breakfast.    . losartan-hydrochlorothiazide (HYZAAR) 100-12.5 MG tablet Take 1 tablet by mouth daily.    . Magnesium Carbonate Heavy  POWD by Does not apply route. Natural Vitality Calm magnesium supplement    . ondansetron (ZOFRAN) 8 MG tablet Take 1 tablet (8 mg total) by mouth every 8 (eight) hours as needed for nausea or vomiting. 20 tablet 0  . palbociclib (IBRANCE) 125 MG tablet Take 1 tablet (125 mg total) by mouth daily. Take for 21 days on, 7 days off, repeat every 28 days. 21 tablet 0  . rosuvastatin (CRESTOR) 5 MG tablet TAKE 1 TABLET BY MOUTH DAILY 30 tablet 6  . traZODone (DESYREL) 50 MG tablet TAKE 1 TABLET BY MOUTH DAILY 30 tablet 6   No current facility-administered medications for this visit.   Facility-Administered Medications Ordered in Other Visits  Medication Dose Route Frequency Provider Last Rate Last Admin  . denosumab (XGEVA) injection 120 mg  120 mg Subcutaneous Once FLloyd Huger MD      . fulvestrant (FASLODEX) injection 500  mg  500 mg Intramuscular Once Lloyd Huger, MD        OBJECTIVE: Vitals:   08/06/19 1424  BP: (!) 132/65  Pulse: 70  Resp: 18  Temp: 97.6 F (36.4 C)  SpO2: 99%     Body mass index is 34.21 kg/m.     ECOG FS:1 - Symptomatic but completely ambulatory  General: Well-developed, well-nourished, no acute distress. Eyes: Pink conjunctiva, anicteric sclera. HEENT: Normocephalic. Wearing mask.  Lungs: Clear to auscultation bilaterally. Heart: Regular rate and rhythm.  Abdomen: Soft, nontender, nondistended.  Musculoskeletal: No edema, cyanosis, or clubbing. Neuro: Alert, answering all questions appropriately. Cranial nerves grossly intact. Skin: No rashes or petechiae noted. Psych: Normal affect.  LAB RESULTS:  Lab Results  Component Value Date   NA 133 (L) 08/06/2019   K 4.1 08/06/2019   CL 101 08/06/2019   CO2 23 08/06/2019   GLUCOSE 102 (H) 08/06/2019   BUN 26 (H) 08/06/2019   CREATININE 0.98 08/06/2019   CALCIUM 9.1 08/06/2019   PROT 7.6 08/06/2019   ALBUMIN 4.4 08/06/2019   AST 36 08/06/2019   ALT 41 08/06/2019   ALKPHOS 122 08/06/2019    BILITOT 0.7 08/06/2019   GFRNONAA 58 (L) 08/06/2019   GFRAA >60 08/06/2019    Lab Results  Component Value Date   WBC 3.2 (L) 08/06/2019   NEUTROABS 1.9 08/06/2019   HGB 11.9 (L) 08/06/2019   HCT 34.6 (L) 08/06/2019   MCV 91.1 08/06/2019   PLT 180 08/06/2019    STUDIES: MR Brain W Wo Contrast  Result Date: 07/08/2019 CLINICAL DATA:  Metastatic breast cancer. EXAM: MRI HEAD WITHOUT AND WITH CONTRAST TECHNIQUE: Multiplanar, multiecho pulse sequences of the brain and surrounding structures were obtained without and with intravenous contrast. CONTRAST:  101m GADAVIST GADOBUTROL 1 MMOL/ML IV SOLN COMPARISON:  None. FINDINGS: Brain: No acute infarction, hemorrhage, hydrocephalus, extra-axial collection or mass lesion. Few scattered foci of T2 hyperintensity are seen within the white matter of the cerebral hemispheres, nonspecific. Moderate parenchymal volume loss. No focus of abnormal contrast enhancement identified. Vascular: Normal flow voids. Skull and upper cervical spine: Normal marrow signal. Sinuses/Orbits: Mucous retention cyst in the left maxillary and frontal sinuses. Other: None. IMPRESSION: 1. No evidence of intracranial metastatic disease. 2. Few scattered foci of T2 hyperintensity within the white matter of the cerebral hemispheres, nonspecific but may represent mild chronic microvascular ischemic changes. 3. Moderate parenchymal volume loss. Electronically Signed   By: KPedro EarlsM.D.   On: 07/08/2019 15:41   ONCOLOGY HISTORY: Patient initially received neoadjuvant chemotherapy with Adriamycin and Cytoxan followed by weekly Taxol. She only received 4 cycles of weekly Taxol prior to discontinuation of treatment on October 31, 2017 secondary to persistent peripheral neuropathy.  She ultimately required mastectomy and final pathology noted 5 of 6 lymph nodes positive for disease.  Patient completed adjuvant XRT in mid April 2020.  Pain initiated letrozole in May 2020,  this was discontinued secondary to side effects and patient was started on anastrozole.  Nuclear medicine bone scan on June 19, 2019 revealed metastatic disease and anastrozole was subsequently discontinued.  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  PLAN:    1. 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 FGrayland Ormondand reported with above metastatic bony disease but no obvious evidence of visceral disease.  CA 27-29 had increased to 141.9.  MRI  of the brain was negative for intracranial metastatic disease. She is s/p cycle 1 of Ibrance 125 mg daily for 21 days with 7 days off in conjunction with Faslodex 500 mg intramuscularly on days 1, 15, and 29 followed by monthly treatment. She receives Zometa every 4 weeks for her bony disease.  Treatment given with palliative intent and plan to continue until disease progression or unacceptable toxicity. I again reviewed PET scan today with patient at her request.  Eliezer Bottom well without significant side effects.  Labs today reviewed and though somewhat overall decreased acceptable to proceed with cycle 2 of Ibrance.  Proceed with Faslodex and Zometa.  Return to clinic in 4 weeks for labs and further evaluation and consideration of cycle 3 of Ibrance, and continuation of Faslodex and Xgeva. Discussed with Nuala Alpha, Pharmacist who will see patient today.  2.  Peripheral neuropathy: Chronic and unchanged. 3.  Pulmonary embolus: Patient was diagnosed with a small pulmonary embolus on January 10, 2018.  She is no longer on anticoagulation. 4.  Osteopenia: Patient's most recent bone mineral density on March 03, 2019 reported a T score of -1.8 which is only mildly decreased from 1 year prior when the reported T score was -1.6.  Continue calcium and vitamin D supplementation.  No vitamin D level on file.   Will check vitamin D level at her next appointment per her request.  5.  Left hip pain: Secondary to metastatic disease. S/p palliative radiation 6. Bone metastasis: Above bony disease.  She has now completed palliative radiation to the left hip and has started palliative radiation to left humerus and scapula.  Continue Xgeva.  Calcium 9.1.  Patient expressed understanding and was in agreement with this plan. She also understands that She can call clinic at any time with any questions, concerns, or complaints.   A total of (35) minutes of face-to-face and non-face-to-face time was spent with this patient with greater than 50% of that time in counseling and care-coordination.  Cancer Staging Breast cancer, stage 2, left ([place]) Staging form: Breast, AJCC 8th Edition - Clinical stage from 07/30/2017: Stage IIA (cT2, cN1, cM0, G2, ER+, PR+, HER2-) - Signed by Lloyd Huger, MD on 07/30/2017   Verlon Au, NP   08/06/2019 3:12 PM

## 2019-08-07 LAB — CANCER ANTIGEN 27.29: CA 27.29: 85.5 U/mL — ABNORMAL HIGH (ref 0.0–38.6)

## 2019-08-17 ENCOUNTER — Ambulatory Visit: Payer: Medicare Other | Admitting: Radiation Oncology

## 2019-08-17 ENCOUNTER — Encounter: Payer: Self-pay | Admitting: Internal Medicine

## 2019-08-17 ENCOUNTER — Ambulatory Visit (INDEPENDENT_AMBULATORY_CARE_PROVIDER_SITE_OTHER): Payer: Medicare Other | Admitting: Internal Medicine

## 2019-08-17 ENCOUNTER — Other Ambulatory Visit: Payer: Self-pay

## 2019-08-17 VITALS — BP 129/66 | HR 74 | Ht 68.0 in | Wt 226.1 lb

## 2019-08-17 DIAGNOSIS — K219 Gastro-esophageal reflux disease without esophagitis: Secondary | ICD-10-CM | POA: Diagnosis not present

## 2019-08-17 DIAGNOSIS — I10 Essential (primary) hypertension: Secondary | ICD-10-CM | POA: Insufficient documentation

## 2019-08-17 DIAGNOSIS — F419 Anxiety disorder, unspecified: Secondary | ICD-10-CM

## 2019-08-17 DIAGNOSIS — C7951 Secondary malignant neoplasm of bone: Secondary | ICD-10-CM | POA: Diagnosis not present

## 2019-08-17 MED ORDER — ALPRAZOLAM 0.5 MG PO TABS: 0.5000 mg | ORAL_TABLET | Freq: Two times a day (BID) | ORAL | 0 refills | Status: DC

## 2019-08-17 NOTE — Assessment & Plan Note (Signed)
-   Patient experiencing high levels of anxiety.  - Encouraged patient to engage in relaxing activities like yoga, meditation, journaling, going for a walk, or participating in a hobby.  - Encouraged patient to reach out to trusted friends or family members about recent struggles 

## 2019-08-17 NOTE — Assessment & Plan Note (Signed)
-   The patient's GERD is stable on medication.  - Instructed the patient to avoid eating spicy and acidic foods, as well as foods high in fat. - Instructed the patient to avoid eating large meals or meals 2-3 hours prior to sleeping. 

## 2019-08-17 NOTE — Progress Notes (Signed)
Established Patient Office Visit  SUBJECTIVE:  Subjective  Patient ID: Alicia [name], female    DOB: April 15, 1950  Age: 69 y.o. MRN: [id number]  CC: No chief complaint on file.   HPI Alicia [name] is a 69 y.o. female presenting today for two-month follow-up.   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 PET-CT scan on 06/30/2019 showed a large hypermetabolic destructive lesion in the anterior left iliac bone consistent with metastatic disease. There were also hypermetabolic lesions in the left humerus and left scapula that were also consistent with metastatic involvement. It also showed asymmetric focal marked hypermetabolism in the posterior right nasopharynx, in the region of the fossa Rosenmuller. Although there was no underlying mass lesion evidence on imaging that day, direct inspection was recommended as mucosal lesion was a concern. There was no evidence of soft tissue hypermetabolic metastases in the chest, abdomen, or pelvis. MRI of brain on 07/07/2019 did not show any evidence of intracranial metastatic disease.  The patient completed palliative radiation treatment directed at the left pelvis on 07/23/2019. She has now undergone CT simulation for radiation treatment to be directed at the left humerus and scapula.  The patient underwent her second cycle of Ibrance in junction with Faslodex and Xgeva for bone metastases on 08/06/2019.  Today, she reports fatigue and intermittent dizziness. She also reports intermittent nausea that is being managed with Zofran and constipation that is being managed with stool softeners.  She states that her blood pressures have been under control.   Past Medical History:  Diagnosis Date  . Anxiety   . Cancer ([place]) 07/2017   left breast  . Depression   . Hypertension   .  Hypothyroidism   . Personal history of chemotherapy 2019   LEFT mastectomy-chemo before  . Personal history of radiation therapy 03/2018   LEFT mastectomy  . Rapid heart rate   . Thyroid disease     Past Surgical History:  Procedure Laterality Date  . AXILLARY LYMPH NODE BIOPSY Left 07/16/2017   METASTATIC MAMMARY CARCINOMA  . BREAST BIOPSY Left 07/16/2017   [place] bx of left breast mass and left breast LN.  INVASIVE MAMMARY CARCINOMA, NO SPECIAL TYPE.   Marland Kitchen BREAST EXCISIONAL BIOPSY Right 2001   benign  . BREAST LUMPECTOMY WITH SENTINEL LYMPH NODE BIOPSY Left 12/06/2017   Procedure: BREAST LUMPECTOMY WITH SENTINEL LYMPH NODE BX;  Surgeon: Robert Bellow, MD;  Location: [facility] ORS;  Service: General;  Laterality: Left;  . COLONOSCOPY    . MASTECTOMY Left 12/23/2017  . PORTACATH PLACEMENT Right 08/07/2017   Procedure: INSERTION PORT-A-CATH;  Surgeon: Robert Bellow, MD;  Location: [facility] ORS;  Service: General;  Laterality: Right;  . SIMPLE MASTECTOMY WITH AXILLARY SENTINEL NODE BIOPSY Left 12/23/2017   T2,N2 with 6/7 nodes positive. Whole breast radiation.  Surgeon: Robert Bellow, MD;  Location: [facility] ORS;  Service: General;  Laterality: Left;  . TONSILLECTOMY      Family History  Problem Relation Age of Onset  . Stroke Mother   . Thyroid disease Mother   . Renal Disease Mother   . Stroke Father   . Heart attack Father   . Sudden death Father 23       suicide  . Anuerysm Brother   . Breast cancer Neg Hx     Social History  Socioeconomic History  . Marital status: Widowed    Spouse name: Not on file  . Number of children: Not on file  . Years of education: Not on file  . Highest education level: Not on file  Occupational History  . Not on file  Tobacco Use  . Smoking status: Current Some Day Smoker    Packs/day: 0.25    Years: 30.00    Pack years: 7.50    Types: Cigarettes  . Smokeless tobacco: Never Used  Vaping Use  . Vaping Use: Never used  Substance and  Sexual Activity  . Alcohol use: Yes    Comment: occasional q 54mo   . Drug use: Never  . Sexual activity: Not Currently  Other Topics Concern  . Not on file  Social History Narrative  . Not on file   Social Determinants of Health   Financial Resource Strain:   . Difficulty of Paying Living Expenses:   Food Insecurity:   . Worried About Charity fundraiser in the Last Year:   . Arboriculturist in the Last Year:   Transportation Needs:   . Film/video editor (Medical):   Marland Kitchen Lack of Transportation (Non-Medical):   Physical Activity:   . Days of Exercise per Week:   . Minutes of Exercise per Session:   Stress:   . Feeling of Stress :   Social Connections:   . Frequency of Communication with Friends and Family:   . Frequency of Social Gatherings with Friends and Family:   . Attends Religious Services:   . Active Member of Clubs or Organizations:   . Attends Archivist Meetings:   Marland Kitchen Marital Status:   Intimate Partner Violence:   . Fear of Current or Ex-Partner:   . Emotionally Abused:   Marland Kitchen Physically Abused:   . Sexually Abused:      Current Outpatient Medications:  .  acetaminophen (TYLENOL) 500 MG tablet, Take 1,000 mg by mouth every 6 (six) hours as needed for moderate pain or headache. , Disp: , Rfl:  .  albuterol (PROVENTIL HFA;VENTOLIN HFA) 108 (90 Base) MCG/ACT inhaler, Inhale 2 puffs into the lungs every 6 (six) hours as needed for wheezing or shortness of breath., Disp: 1 Inhaler, Rfl: 2 .  ALPRAZolam (XANAX) 0.25 MG tablet, Take 1 tablet (0.25 mg total) by mouth 2 (two) times daily., Disp: 30 tablet, Rfl: 2 .  amLODipine (NORVASC) 5 MG tablet, TAKE 1 TABLET BY MOUTH DAILY, Disp: 90 tablet, Rfl: 3 .  aspirin EC 81 MG tablet, Take 81 mg by mouth at bedtime. , Disp: , Rfl:  .  buPROPion (WELLBUTRIN SR) 150 MG 12 hr tablet, Take 150 mg by mouth 2 (two) times daily., Disp: , Rfl:  .  calcium carbonate (OS-CAL - DOSED IN MG OF ELEMENTAL CALCIUM) 1250 (500 Ca)  MG tablet, Take 1 tablet by mouth daily with breakfast., Disp: , Rfl:  .  calcium-vitamin D (OSCAL WITH D) 250-125 MG-UNIT tablet, Take 1 tablet by mouth 2 (two) times daily., Disp: , Rfl:  .  Cholecalciferol (VITAMIN D3) 125 MCG (5000 UT) CAPS, Take 1 capsule by mouth 2 (two) times a day., Disp: , Rfl:  .  docusate sodium (COLACE) 100 MG capsule, Take 100 mg by mouth 3 (three) times daily as needed for mild constipation., Disp: , Rfl:  .  levothyroxine (SYNTHROID, LEVOTHROID) 100 MCG tablet, Take 100 mcg by mouth daily before breakfast., Disp: , Rfl:  .  losartan-hydrochlorothiazide (HYZAAR)  100-12.5 MG tablet, Take 1 tablet by mouth daily., Disp: , Rfl:  .  Magnesium Carbonate Heavy POWD, by Does not apply route. Natural Vitality Calm magnesium supplement, Disp: , Rfl:  .  ondansetron (ZOFRAN) 8 MG tablet, Take 1 tablet (8 mg total) by mouth every 8 (eight) hours as needed for nausea or vomiting., Disp: 20 tablet, Rfl: 0 .  palbociclib (IBRANCE) 125 MG tablet, Take 1 tablet (125 mg total) by mouth daily. Take for 21 days on, 7 days off, repeat every 28 days., Disp: 21 tablet, Rfl: 0 .  rosuvastatin (CRESTOR) 5 MG tablet, TAKE 1 TABLET BY MOUTH DAILY, Disp: 30 tablet, Rfl: 6 .  traZODone (DESYREL) 50 MG tablet, TAKE 1 TABLET BY MOUTH DAILY, Disp: 30 tablet, Rfl: 6 .  ALPRAZolam (XANAX) 0.5 MG tablet, Take 1 tablet (0.5 mg total) by mouth 2 (two) times daily., Disp: 60 tablet, Rfl: 0   Allergies  Allergen Reactions  . Sulfa Antibiotics Diarrhea    ROS Review of Systems  Constitutional: Positive for fatigue.  HENT: Negative.   Eyes: Negative.   Respiratory: Negative.   Cardiovascular: Negative.   Gastrointestinal: Positive for constipation and nausea.  Endocrine: Negative.   Genitourinary: Negative.   Musculoskeletal: Negative.   Skin: Negative.   Allergic/Immunologic: Negative.   Neurological: Positive for dizziness.  Hematological: Negative.   Psychiatric/Behavioral: Negative.     All other systems reviewed and are negative.    OBJECTIVE:    Physical Exam  BP 129/66   Pulse 74   Ht 5\' 8"  (1.727 m)   Wt 226 lb 1.6 oz (102.6 kg)   BMI 34.38 kg/m  Wt Readings from Last 3 Encounters:  08/17/19 226 lb 1.6 oz (102.6 kg)  08/06/19 (!) 225 lb (102.1 kg)  07/30/19 224 lb (101.6 kg)    Health Maintenance Due  Topic Date Due  . Hepatitis C Screening  Never done  . COVID-19 Vaccine (1) Never done  . TETANUS/TDAP  Never done  . COLONOSCOPY  Never done  . PNA vac Low Risk Adult (1 of 2 - PCV13) Never done  . INFLUENZA VACCINE  08/16/2019    There are no preventive care reminders to display for this patient.  CBC Latest Ref Rng & Units 08/06/2019 07/09/2019 04/28/2018  WBC 4.0 - 10.5 K/uL 3.2(L) 7.4 6.4  Hemoglobin 12.0 - 15.0 g/dL 11.9(L) 12.8 12.6  Hematocrit 36 - 46 % 34.6(L) 38.2 37.4  Platelets 150 - 400 K/uL 180 242 192   CMP Latest Ref Rng & Units 08/06/2019 07/09/2019 03/04/2019  Glucose 70 - 99 mg/dL 102(H) 104(H) 93  BUN 8 - 23 mg/dL 26(H) 24(H) -  Creatinine 0.44 - 1.00 mg/dL 0.98 1.25(H) -  Sodium 135 - 145 mmol/L 133(L) 138 -  Potassium 3.5 - 5.1 mmol/L 4.1 4.1 -  Chloride 98 - 111 mmol/L 101 100 -  CO2 22 - 32 mmol/L 23 27 -  Calcium 8.9 - 10.3 mg/dL 9.1 9.7 -  Total Protein 6.5 - 8.1 g/dL 7.6 7.5 -  Total Bilirubin 0.3 - 1.2 mg/dL 0.7 0.7 -  Alkaline Phos 38 - 126 U/L 122 155(H) -  AST 15 - 41 U/L 36 21 -  ALT 0 - 44 U/L 41 24 -    Lab Results  Component Value Date   TSH 1.065 10/04/2017   Lab Results  Component Value Date   ALBUMIN 4.4 08/06/2019   ANIONGAP 9 08/06/2019   Lab Results  Component Value Date  CHOL 151 02/18/2015   HDL 65 02/18/2015   LDLCALC 76 02/18/2015   CHOLHDL 2.3 02/18/2015   Lab Results  Component Value Date   TRIG 49 02/18/2015   Lab Results  Component Value Date   HGBA1C 5.9 (H) 10/04/2017   HGBA1C 5.6 02/18/2015      ASSESSMENT & PLAN:   Problem List Items Addressed This Visit       Cardiovascular and Mediastinum   Essential hypertension    - Today, the patient's blood pressure is well managed on Losartan with HCTZ. - The patient will continue the current treatment regimen.  - I encouraged the patient to eat a low-sodium diet to help control blood pressure. - I encouraged the patient to live an active lifestyle and complete activities that increases heart rate to 85% target heart rate at least 5 times per week for one hour.  - Her blood pressure was elevated secondary to anxiety today.        Digestive   Gastroesophageal reflux disease without esophagitis    - The patient's GERD is stable on medication.  - Instructed the patient to avoid eating spicy and acidic foods, as well as foods high in fat. - Instructed the patient to avoid eating large meals or meals 2-3 hours prior to sleeping.         Musculoskeletal and Integument   Bone metastases ([place])    PET-CT scan report was discussed with the patient. At the present time, the patient is completing radiation treatment directed at the left scapula and humerus. She has already completed palliative radiation treatment directed at the left hip.  Lab tests were also reviewed with the patient. She is currently undergoing cycle #2 of Ibrance with Xgeva and Faslodex.      Relevant Medications   ALPRAZolam (XANAX) 0.5 MG tablet     Other   Anxiety - Primary    - Patient experiencing high levels of anxiety.  - Encouraged patient to engage in relaxing activities like yoga, meditation, journaling, going for a walk, or participating in a hobby.  - Encouraged patient to reach out to trusted friends or family members about recent struggles       Relevant Medications   ALPRAZolam (XANAX) 0.5 MG tablet      Meds ordered this encounter  Medications  . ALPRAZolam (XANAX) 0.5 MG tablet    Sig: Take 1 tablet (0.5 mg total) by mouth 2 (two) times daily.    Dispense:  60 tablet    Refill:  0     Follow-up: Return in  about 2 months (around 10/17/2019).    Dr. Jane Canary [facility] [address], [place], [place] [address]   By signing my name below, I, Clerance Lav, attest that this documentation has been prepared under the direction and in the presence of [name], MD. Electronically Signed: [name], MD 08/17/19, 4:25 PM   I personally performed the services described in this documentation, which was SCRIBED in my presence. The recorded information has been reviewed and considered accurate. It has been edited as necessary during review. [name], MD

## 2019-08-17 NOTE — Assessment & Plan Note (Signed)
-   Today, the patient's blood pressure is well managed on Losartan with HCTZ. - The patient will continue the current treatment regimen.  - I encouraged the patient to eat a low-sodium diet to help control blood pressure. - I encouraged the patient to live an active lifestyle and complete activities that increases heart rate to 85% target heart rate at least 5 times per week for one hour.  - Her blood pressure was elevated secondary to anxiety today.

## 2019-08-17 NOTE — Assessment & Plan Note (Signed)
PET-CT scan report was discussed with the patient. At the present time, the patient is completing radiation treatment directed at the left scapula and humerus. She has already completed palliative radiation treatment directed at the left hip.  Lab tests were also reviewed with the patient. She is currently undergoing cycle #2 of Ibrance with Xgeva and Faslodex.

## 2019-08-24 ENCOUNTER — Ambulatory Visit
Admission: RE | Admit: 2019-08-24 | Discharge: 2019-08-24 | Disposition: A | Discharge status: Home / Self Care | Payer: Medicare Other | Source: Ambulatory Visit | Attending: Oncology | Admitting: Oncology

## 2019-08-24 ENCOUNTER — Other Ambulatory Visit: Payer: Self-pay

## 2019-08-24 DIAGNOSIS — Z9012 Acquired absence of left breast and nipple: Secondary | ICD-10-CM | POA: Insufficient documentation

## 2019-08-24 DIAGNOSIS — Z1231 Encounter for screening mammogram for malignant neoplasm of breast: Secondary | ICD-10-CM | POA: Diagnosis not present

## 2019-08-24 DIAGNOSIS — Z853 Personal history of malignant neoplasm of breast: Secondary | ICD-10-CM | POA: Insufficient documentation

## 2019-08-24 DIAGNOSIS — R922 Inconclusive mammogram: Secondary | ICD-10-CM | POA: Diagnosis not present

## 2019-08-24 DIAGNOSIS — C50912 Malignant neoplasm of unspecified site of left female breast: Secondary | ICD-10-CM

## 2019-08-25 ENCOUNTER — Other Ambulatory Visit: Payer: Self-pay | Admitting: Oncology

## 2019-08-25 DIAGNOSIS — R928 Other abnormal and inconclusive findings on diagnostic imaging of breast: Secondary | ICD-10-CM

## 2019-08-25 DIAGNOSIS — N6489 Other specified disorders of breast: Secondary | ICD-10-CM

## 2019-08-27 ENCOUNTER — Telehealth: Payer: Self-pay

## 2019-08-27 ENCOUNTER — Ambulatory Visit: Payer: Medicare Other | Admitting: Oncology

## 2019-08-27 DIAGNOSIS — C50919 Malignant neoplasm of unspecified site of unspecified female breast: Secondary | ICD-10-CM

## 2019-08-28 NOTE — Progress Notes (Signed)
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  INTERVAL HISTORY: Patient returns to clinic today for further evaluation and continuation of Ibrance, Faslodex, and Xgeva.  She is tolerating her treatments well without significant side effects.  She continues to be anxious, but otherwise feels well.  She does not complain of pain today.  She continues to have a mild peripheral neuropathy, but no other neurologic complaints. She denies any recent fevers or illnesses.  She denies any chest pain, shortness of breath, cough, or hemoptysis.  She denies any nausea, vomiting, constipation, or diarrhea.  She has no urinary complaints.  Patient offers no further specific complaints today.  REVIEW OF SYSTEMS:   Review of Systems  Constitutional: Negative.  Negative for fever, malaise/fatigue and weight loss.  Respiratory: Negative.  Negative for cough and shortness of breath.   Cardiovascular: Negative.  Negative for chest pain and leg swelling.  Gastrointestinal: Negative.  Negative for abdominal pain, constipation and nausea.  Genitourinary: Negative.  Negative for dysuria and urgency.  Musculoskeletal: Negative.  Negative for back pain and joint pain.  Skin: Negative.  Negative for rash.  Neurological: Positive for tingling and sensory change. Negative for dizziness, focal weakness,  weakness and headaches.  Psychiatric/Behavioral: The patient is nervous/anxious.     As per HPI. Otherwise, a complete review of systems is negative.  PAST MEDICAL HISTORY: Past Medical History:  Diagnosis Date  . Anxiety   . Cancer ([place]) 07/2017   left breast  . Depression   . Hypertension   . Hypothyroidism   . Personal history of chemotherapy 2019   LEFT mastectomy-chemo before  . Personal history of radiation therapy 03/2018   LEFT mastectomy  . Rapid heart rate   . Thyroid disease     PAST SURGICAL HISTORY: Past Surgical History:  Procedure Laterality Date  . AXILLARY LYMPH NODE BIOPSY Left 07/16/2017   METASTATIC MAMMARY CARCINOMA  . BREAST BIOPSY Left 07/16/2017   [place] bx of left breast mass and left breast LN.  INVASIVE MAMMARY CARCINOMA, NO SPECIAL TYPE.   Marland Kitchen BREAST EXCISIONAL BIOPSY Right 2001   benign  . BREAST LUMPECTOMY WITH SENTINEL LYMPH NODE BIOPSY Left 12/06/2017   Procedure: BREAST LUMPECTOMY WITH SENTINEL LYMPH NODE BX;  Surgeon: Robert Bellow, MD;  Location: [facility] ORS;  Service: General;  Laterality: Left;  . COLONOSCOPY    . MASTECTOMY Left 12/23/2017  . PORTACATH PLACEMENT Right 08/07/2017   Procedure: INSERTION PORT-A-CATH;  Surgeon: Robert Bellow, MD;  Location: [facility] ORS;  Service: General;  Laterality: Right;  . SIMPLE MASTECTOMY WITH AXILLARY SENTINEL NODE BIOPSY Left 12/23/2017   T2,N2 with 6/7 nodes positive. Whole breast radiation.  Surgeon: Robert Bellow, MD;  Location: [facility] ORS;  Service: General;  Laterality: Left;  . TONSILLECTOMY      FAMILY HISTORY: Family History  Problem Relation Age of Onset  . Stroke Mother   . Thyroid disease Mother   . Renal Disease Mother   . Stroke  Father   . Heart attack Father   . Sudden death Father 65       suicide  . Anuerysm Brother   . Breast cancer Neg Hx     ADVANCED DIRECTIVES (Y/N):  N  HEALTH MAINTENANCE: Social History   Tobacco Use  . Smoking status: Current Some Day  Smoker    Packs/day: 0.25    Years: 30.00    Pack years: 7.50    Types: Cigarettes  . Smokeless tobacco: Never Used  Vaping Use  . Vaping Use: Never used  Substance Use Topics  . Alcohol use: Yes    Comment: occasional q 38mo  . Drug use: Never     Colonoscopy:  PAP:  Bone density:  Lipid panel:  Allergies  Allergen Reactions  . Sulfa Antibiotics Diarrhea    Current Outpatient Medications  Medication Sig Dispense Refill  . acetaminophen (TYLENOL) 500 MG tablet Take 1,000 mg by mouth every 6 (six) hours as needed for moderate pain or headache.     . albuterol (PROVENTIL HFA;VENTOLIN HFA) 108 (90 Base) MCG/ACT inhaler Inhale 2 puffs into the lungs every 6 (six) hours as needed for wheezing or shortness of breath. 1 Inhaler 2  . ALPRAZolam (XANAX) 0.25 MG tablet Take 1 tablet (0.25 mg total) by mouth 2 (two) times daily. 30 tablet 2  . ALPRAZolam (XANAX) 0.5 MG tablet Take 1 tablet (0.5 mg total) by mouth 2 (two) times daily. 60 tablet 0  . amLODipine (NORVASC) 5 MG tablet TAKE 1 TABLET BY MOUTH DAILY 90 tablet 3  . aspirin EC 81 MG tablet Take 81 mg by mouth at bedtime.     .Marland KitchenbuPROPion (WELLBUTRIN SR) 150 MG 12 hr tablet Take 150 mg by mouth 2 (two) times daily.    . calcium carbonate (OS-CAL - DOSED IN MG OF ELEMENTAL CALCIUM) 1250 (500 Ca) MG tablet Take 1 tablet by mouth daily with breakfast.    . calcium-vitamin D (OSCAL WITH D) 250-125 MG-UNIT tablet Take 1 tablet by mouth 2 (two) times daily.    . Cholecalciferol (VITAMIN D3) 125 MCG (5000 UT) CAPS Take 1 capsule by mouth 2 (two) times a day.    . docusate sodium (COLACE) 100 MG capsule Take 100 mg by mouth 3 (three) times daily as needed for mild constipation.    .Marland Kitchenlevothyroxine (SYNTHROID, LEVOTHROID) 100 MCG tablet Take 100 mcg by mouth daily before breakfast.    . lisinopril (ZESTRIL) 20 MG tablet Take 20 mg by mouth daily.    .Marland Kitchenlosartan-hydrochlorothiazide (HYZAAR) 100-12.5 MG tablet Take 1 tablet by mouth daily.    .  Magnesium Carbonate Heavy POWD by Does not apply route. Natural Vitality Calm magnesium supplement    . ondansetron (ZOFRAN) 8 MG tablet Take 1 tablet (8 mg total) by mouth every 8 (eight) hours as needed for nausea or vomiting. 20 tablet 0  . palbociclib (IBRANCE) 125 MG tablet Take 1 tablet (125 mg total) by mouth daily. Take for 21 days on, 7 days off, repeat every 28 days. 21 tablet 0  . rosuvastatin (CRESTOR) 5 MG tablet TAKE 1 TABLET BY MOUTH DAILY 30 tablet 6  . traZODone (DESYREL) 50 MG tablet TAKE 1 TABLET BY MOUTH DAILY 30 tablet 6   No current facility-administered medications for this visit.   Facility-Administered Medications Ordered in Other Visits  Medication Dose Route Frequency Provider Last Rate Last Admin  . denosumab (XGEVA) injection 120 mg  120 mg Subcutaneous Once FDelight Hoh  J, MD      . fulvestrant (FASLODEX) injection 500 mg  500 mg Intramuscular Once Jeralyn Ruths, MD        OBJECTIVE: Vitals:   09/03/19 1348  BP: 133/72  Pulse: 86  Resp: 16  Temp: (!) 97.4 F (36.3 C)  SpO2: 98%     Body mass index is 34.18 kg/m.    ECOG FS:0 - Asymptomatic  General: Well-developed, well-nourished, no acute distress. Eyes: Pink conjunctiva, anicteric sclera. HEENT: Normocephalic, moist mucous membranes. Lungs: No audible wheezing or coughing. Heart: Regular rate and rhythm. Abdomen: Soft, nontender, no obvious distention. Musculoskeletal: No edema, cyanosis, or clubbing. Neuro: Alert, answering all questions appropriately. Cranial nerves grossly intact. Skin: No rashes or petechiae noted. Psych: Normal affect.  LAB RESULTS:  Lab Results  Component Value Date   NA 136 09/03/2019   K 3.9 09/03/2019   CL 101 09/03/2019   CO2 26 09/03/2019   GLUCOSE 108 (H) 09/03/2019   BUN 18 09/03/2019   CREATININE 1.07 (H) 09/03/2019   CALCIUM 9.4 09/03/2019   PROT 7.9 09/03/2019   ALBUMIN 4.5 09/03/2019   AST 20 09/03/2019   ALT 18 09/03/2019   ALKPHOS 98  09/03/2019   BILITOT 0.6 09/03/2019   GFRNONAA 53 (L) 09/03/2019   GFRAA >60 09/03/2019    Lab Results  Component Value Date   WBC 2.6 (L) 09/03/2019   NEUTROABS 1.6 (L) 09/03/2019   HGB 11.7 (L) 09/03/2019   HCT 33.5 (L) 09/03/2019   MCV 92.8 09/03/2019   PLT 167 09/03/2019     STUDIES: MM 3D SCREEN BREAST UNI RIGHT  Result Date: 08/24/2019 CLINICAL DATA:  Screening. History of LEFT mastectomy in 2019. EXAM: DIGITAL SCREENING UNILATERAL RIGHT MAMMOGRAM WITH CAD AND TOMO COMPARISON:  Previous exam(s). ACR Breast Density Category b: There are scattered areas of fibroglandular density. FINDINGS: In the right breast, a possible focal asymmetry in the upper inner breast posterior depth warrants further evaluation. It is best seen on CC slice 12 and MLO slice 9. Status post LEFT mastectomy. Images were processed with CAD. IMPRESSION: Further evaluation is suggested for possible focal asymmetry in the right breast. This likely reflects scar from prior port. RECOMMENDATION: Diagnostic mammogram and possibly ultrasound of the right breast. (Code:[id number]) The patient will be contacted regarding the findings, and additional imaging will be scheduled. BI-RADS CATEGORY  0: Incomplete. Need additional imaging evaluation and/or prior mammograms for comparison. Electronically Signed   By: Meda Klinefelter MD   On: 08/24/2019 14:20   ONCOLOGY HISTORY: Patient initially received neoadjuvant chemotherapy with Adriamycin and Cytoxan followed by weekly Taxol. She only received 4 cycles of weekly Taxol prior to discontinuation of treatment on October 31, 2017 secondary to persistent peripheral neuropathy.  She ultimately required mastectomy and final pathology noted 5 of 6 lymph nodes positive for disease.  Patient completed adjuvant XRT in mid April 2020.  Pain initiated letrozole in May 2020, this was discontinued secondary to side effects and patient was started on anastrozole.  Nuclear medicine bone scan on  June 19, 2019 revealed metastatic disease and anastrozole was subsequently discontinued.  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  PLAN:    1. 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  MRI of the brain on August 24, 2019 reviewed independently with no obvious  evidence of metastatic disease.  Patient's CA 27-29 initially increased to 141.9, but is now trending down and is 85.5.  Today's result is pending.  Continue 125 mg Ibrance daily for 21 days, with 7 days off.  Patient has now completed her loading dose of Faslodex 500 mg intramuscularly and will continue maintenance dosing every [redacted] weeks along with Xgeva.  Return to clinic in 4 weeks for further evaluation and continuation of treatment.  Appreciate clinical pharmacy input.   2.  Peripheral neuropathy: Chronic and unchanged. 3.  Pulmonary embolus: Patient was diagnosed with a small pulmonary embolus on January 10, 2018.  She is no longer on anticoagulation. 4.  Osteopenia: Patient's most recent bone mineral density on March 03, 2019 reported a T score of -1.8 which is only mildly decreased from 1 year prior when the reported T score was -1.6.  Continue calcium and vitamin D supplementation.     5.  Left hip pain: Secondary to metastatic disease.  Resolved.  Patient has completed XRT. 6.  Leukopenia: Secondary to Ibrance.  Monitor and proceed with treatment as scheduled.  Patient expressed understanding and was in agreement with this plan. She also understands that She can call clinic at any time with any questions, concerns, or complaints.   Cancer Staging Breast cancer, stage 2, left ([place]) Staging form: Breast, AJCC 8th Edition - Clinical stage from 07/30/2017: Stage IIA (cT2, cN1, cM0, G2, ER+, PR+,  HER2-) - Signed by Lloyd Huger, MD on 07/30/2017   Lloyd Huger, MD   09/03/2019 2:19 PM

## 2019-09-03 ENCOUNTER — Inpatient Hospital Stay: Payer: Medicare Other

## 2019-09-03 ENCOUNTER — Inpatient Hospital Stay: Payer: Medicare Other | Admitting: Pharmacist

## 2019-09-03 ENCOUNTER — Other Ambulatory Visit: Payer: Self-pay

## 2019-09-03 ENCOUNTER — Inpatient Hospital Stay: Payer: Medicare Other | Attending: Oncology

## 2019-09-03 ENCOUNTER — Inpatient Hospital Stay (HOSPITAL_BASED_OUTPATIENT_CLINIC_OR_DEPARTMENT_OTHER): Payer: Medicare Other | Admitting: Oncology

## 2019-09-03 ENCOUNTER — Encounter: Payer: Self-pay | Admitting: Oncology

## 2019-09-03 VITALS — BP 133/72 | HR 86 | Temp 97.4°F | Resp 16 | Wt 224.8 lb

## 2019-09-03 DIAGNOSIS — C50912 Malignant neoplasm of unspecified site of left female breast: Secondary | ICD-10-CM

## 2019-09-03 DIAGNOSIS — M8580 Other specified disorders of bone density and structure, unspecified site: Secondary | ICD-10-CM

## 2019-09-03 DIAGNOSIS — C7951 Secondary malignant neoplasm of bone: Secondary | ICD-10-CM | POA: Diagnosis not present

## 2019-09-03 DIAGNOSIS — Z5111 Encounter for antineoplastic chemotherapy: Secondary | ICD-10-CM | POA: Diagnosis not present

## 2019-09-03 DIAGNOSIS — C50919 Malignant neoplasm of unspecified site of unspecified female breast: Secondary | ICD-10-CM

## 2019-09-03 LAB — CBC WITH DIFFERENTIAL/PLATELET
Abs Immature Granulocytes: 0.01 10*3/uL (ref 0.00–0.07)
Basophils Absolute: 0 10*3/uL (ref 0.0–0.1)
Basophils Relative: 2 %
Eosinophils Absolute: 0 10*3/uL (ref 0.0–0.5)
Eosinophils Relative: 1 %
HCT: 33.5 % — ABNORMAL LOW (ref 36.0–46.0)
Hemoglobin: 11.7 g/dL — ABNORMAL LOW (ref 12.0–15.0)
Immature Granulocytes: 0 %
Lymphocytes Relative: 19 %
Lymphs Abs: 0.5 10*3/uL — ABNORMAL LOW (ref 0.7–4.0)
MCH: 32.4 pg (ref 26.0–34.0)
MCHC: 34.9 g/dL (ref 30.0–36.0)
MCV: 92.8 fL (ref 80.0–100.0)
Monocytes Absolute: 0.4 10*3/uL (ref 0.1–1.0)
Monocytes Relative: 16 %
Neutro Abs: 1.6 10*3/uL — ABNORMAL LOW (ref 1.7–7.7)
Neutrophils Relative %: 62 %
Platelets: 167 10*3/uL (ref 150–400)
RBC: 3.61 MIL/uL — ABNORMAL LOW (ref 3.87–5.11)
RDW: 17.6 % — ABNORMAL HIGH (ref 11.5–15.5)
WBC: 2.6 10*3/uL — ABNORMAL LOW (ref 4.0–10.5)
nRBC: 0 % (ref 0.0–0.2)

## 2019-09-03 LAB — COMPREHENSIVE METABOLIC PANEL
ALT: 18 U/L (ref 0–44)
AST: 20 U/L (ref 15–41)
Albumin: 4.5 g/dL (ref 3.5–5.0)
Alkaline Phosphatase: 98 U/L (ref 38–126)
Anion gap: 9 (ref 5–15)
BUN: 18 mg/dL (ref 8–23)
CO2: 26 mmol/L (ref 22–32)
Calcium: 9.4 mg/dL (ref 8.9–10.3)
Chloride: 101 mmol/L (ref 98–111)
Creatinine, Ser: 1.07 mg/dL — ABNORMAL HIGH (ref 0.44–1.00)
GFR calc Af Amer: >60 mL/min (ref >60)
GFR calc non Af Amer: 53 mL/min — ABNORMAL LOW (ref >60)
Glucose, Bld: 108 mg/dL — ABNORMAL HIGH (ref 70–99)
Potassium: 3.9 mmol/L (ref 3.5–5.1)
Sodium: 136 mmol/L (ref 135–145)
Total Bilirubin: 0.6 mg/dL (ref 0.3–1.2)
Total Protein: 7.9 g/dL (ref 6.5–8.1)

## 2019-09-03 LAB — VITAMIN D 25 HYDROXY (VIT D DEFICIENCY, FRACTURES): Vit D, 25-Hydroxy: 26.07 ng/mL — ABNORMAL LOW (ref 30–100)

## 2019-09-03 MED ORDER — FULVESTRANT 250 MG/5ML IM SOLN
500.0000 mg | Freq: Once | INTRAMUSCULAR | Status: AC
  Administered 2019-09-03: 500 mg via INTRAMUSCULAR
  Filled 2019-09-03: qty 10

## 2019-09-03 MED ORDER — DENOSUMAB 120 MG/1.7ML ~~LOC~~ SOLN
120.0000 mg | Freq: Once | SUBCUTANEOUS | Status: AC
  Administered 2019-09-03: 120 mg via SUBCUTANEOUS
  Filled 2019-09-03: qty 1.7

## 2019-09-03 NOTE — Progress Notes (Signed)
[place]  [phone] [phone]  Patient Care Team: [name], MD as PCP - General (Internal Medicine) Rico Junker, RN as Registered Nurse Lloyd Huger, MD as Consulting Physician (Oncology) Bary Castilla Forest Gleason, MD as Consulting Physician (General Surgery) Bary Castilla Forest Gleason, MD as Consulting Physician (General Surgery) Noreene Filbert, MD as Referring Physician (Radiation Oncology) Jeral Fruit, RN as Registered Nurse   Name of the patient: Alicia [name]  [id number]  March 22, 1950   Date of visit: 09/03/19  HPI: Patient is a 69 y.o. female with recurrent stage IV ER/PR positive, HER2 negative breast cancer. Patient started on Ibrance on 07/09/19.  Reason for Consult: Oral chemotherapy follow-up for Ibrance (palbociclib) therapy, s/p cycle two.   PAST MEDICAL HISTORY: Past Medical History:  Diagnosis Date  . Anxiety   . Cancer ([place]) 07/2017   left breast  . Depression   . Hypertension   . Hypothyroidism   . Personal history of chemotherapy 2019   LEFT mastectomy-chemo before  . Personal history of radiation therapy 03/2018   LEFT mastectomy  . Rapid heart rate   . Thyroid disease     PAST SURGICAL HISTORY:  Past Surgical History:  Procedure Laterality Date  . AXILLARY LYMPH NODE BIOPSY Left 07/16/2017   METASTATIC MAMMARY CARCINOMA  . BREAST BIOPSY Left 07/16/2017   [place] bx of left breast mass and left breast LN.  INVASIVE MAMMARY CARCINOMA, NO SPECIAL TYPE.   Marland Kitchen BREAST EXCISIONAL BIOPSY Right 2001   benign  . BREAST LUMPECTOMY WITH SENTINEL LYMPH NODE BIOPSY Left 12/06/2017   Procedure: BREAST LUMPECTOMY WITH SENTINEL LYMPH NODE BX;  Surgeon: Robert Bellow, MD;  Location: [facility] ORS;  Service: General;  Laterality: Left;  . COLONOSCOPY    . MASTECTOMY Left 12/23/2017  . PORTACATH PLACEMENT Right 08/07/2017   Procedure: INSERTION PORT-A-CATH;  Surgeon: Robert Bellow,  MD;  Location: [facility] ORS;  Service: General;  Laterality: Right;  . SIMPLE MASTECTOMY WITH AXILLARY SENTINEL NODE BIOPSY Left 12/23/2017   T2,N2 with 6/7 nodes positive. Whole breast radiation.  Surgeon: Robert Bellow, MD;  Location: [facility] ORS;  Service: General;  Laterality: Left;  . TONSILLECTOMY      HEMATOLOGY/ONCOLOGY HISTORY:  Oncology History Overview Note  Patient is a 69 year old female who recently self palpated a mass on her left breast.  Subsequent imaging and biopsy revealed the above-stated breast cancer.  Case was also discussed extensively at case conference.  Given the size and the stage of patient's malignancy, she will benefit from neoadjuvant chemotherapy using Adriamycin, Cytoxan, and Taxol.  Patient will also require Neulasta support.  Will get CT scan of the chest, abdomen, and pelvis to assess for any metastatic disease.  Patient will also require port placement and MUGA prior to initiating treatment  CT abdomen/pelvis/chest did not reveal any suspicious lesions concerning for metastatic disease. (08/01/17)  Port-A-Cath placed on 08/07/2017.  Cycle 1 day 1 of AC was given on 08/08/17.     Breast cancer, stage 2, left ([place])  07/24/2017 Initial Diagnosis   Breast cancer, stage 2, left ([place])   07/30/2017 Cancer Staging   Staging form: Breast, AJCC 8th Edition - Clinical stage from 07/30/2017: Stage IIA (cT2, cN1, cM0, G2, ER+, PR+, HER2-) - Signed by Lloyd Huger, MD on 07/30/2017   08/08/2017 - 10/31/2017 Chemotherapy   The patient had DOXOrubicin (ADRIAMYCIN) chemo injection 130 mg, 60 mg/m2 = 130 mg, Intravenous,  Once, 4 of 4 cycles  Administration: 130 mg (08/08/2017), 130 mg (08/22/2017), 130 mg (09/05/2017), 130 mg (09/19/2017) palonosetron (ALOXI) injection 0.25 mg, 0.25 mg, Intravenous,  Once, 4 of 4 cycles Administration: 0.25 mg (08/08/2017), 0.25 mg (08/22/2017), 0.25 mg (09/05/2017), 0.25 mg (09/19/2017) pegfilgrastim-cbqv (UDENYCA) injection 6 mg, 6 mg,  Subcutaneous, Once, 4 of 4 cycles Administration: 6 mg (08/09/2017), 6 mg (08/23/2017), 6 mg (09/06/2017), 6 mg (09/20/2017) cyclophosphamide (CYTOXAN) 1,300 mg in sodium chloride 0.9 % 250 mL chemo infusion, 600 mg/m2 = 1,300 mg, Intravenous,  Once, 4 of 4 cycles Administration: 1,300 mg (08/08/2017), 1,300 mg (08/22/2017), 1,300 mg (09/05/2017), 1,300 mg (09/19/2017) PACLitaxel (TAXOL) 174 mg in sodium chloride 0.9 % 250 mL chemo infusion (</= $RemoveBefor'80mg'VtHsGzGynUQB$ /m2), 80 mg/m2 = 174 mg, Intravenous,  Once, 4 of 12 cycles Dose modification: 72 mg/m2 (original dose 80 mg/m2, Cycle 6, Reason: Dose not tolerated) Administration: 174 mg (10/03/2017), 156 mg (10/17/2017), 156 mg (10/24/2017), 156 mg (10/31/2017) fosaprepitant (EMEND) 150 mg, dexamethasone (DECADRON) 12 mg in sodium chloride 0.9 % 145 mL IVPB, , Intravenous,  Once, 4 of 4 cycles Administration:  (08/08/2017),  (08/22/2017),  (09/05/2017),  (09/19/2017)  for chemotherapy treatment.      ALLERGIES:  is allergic to sulfa antibiotics.  MEDICATIONS:  Current Outpatient Medications  Medication Sig Dispense Refill  . acetaminophen (TYLENOL) 500 MG tablet Take 1,000 mg by mouth every 6 (six) hours as needed for moderate pain or headache.     . albuterol (PROVENTIL HFA;VENTOLIN HFA) 108 (90 Base) MCG/ACT inhaler Inhale 2 puffs into the lungs every 6 (six) hours as needed for wheezing or shortness of breath. 1 Inhaler 2  . ALPRAZolam (XANAX) 0.25 MG tablet Take 1 tablet (0.25 mg total) by mouth 2 (two) times daily. 30 tablet 2  . ALPRAZolam (XANAX) 0.5 MG tablet Take 1 tablet (0.5 mg total) by mouth 2 (two) times daily. 60 tablet 0  . amLODipine (NORVASC) 5 MG tablet TAKE 1 TABLET BY MOUTH DAILY 90 tablet 3  . aspirin EC 81 MG tablet Take 81 mg by mouth at bedtime.     Marland Kitchen buPROPion (WELLBUTRIN SR) 150 MG 12 hr tablet Take 150 mg by mouth 2 (two) times daily.    . calcium carbonate (OS-CAL - DOSED IN MG OF ELEMENTAL CALCIUM) 1250 (500 Ca) MG tablet Take 1 tablet by mouth  daily with breakfast.    . calcium-vitamin D (OSCAL WITH D) 250-125 MG-UNIT tablet Take 1 tablet by mouth 2 (two) times daily.    . Cholecalciferol (VITAMIN D3) 125 MCG (5000 UT) CAPS Take 1 capsule by mouth 2 (two) times a day.    . docusate sodium (COLACE) 100 MG capsule Take 100 mg by mouth 3 (three) times daily as needed for mild constipation.    Marland Kitchen levothyroxine (SYNTHROID, LEVOTHROID) 100 MCG tablet Take 100 mcg by mouth daily before breakfast.    . losartan-hydrochlorothiazide (HYZAAR) 100-12.5 MG tablet Take 1 tablet by mouth daily.    . Magnesium Carbonate Heavy POWD by Does not apply route. Natural Vitality Calm magnesium supplement    . ondansetron (ZOFRAN) 8 MG tablet Take 1 tablet (8 mg total) by mouth every 8 (eight) hours as needed for nausea or vomiting. 20 tablet 0  . palbociclib (IBRANCE) 125 MG tablet Take 1 tablet (125 mg total) by mouth daily. Take for 21 days on, 7 days off, repeat every 28 days. 21 tablet 0  . rosuvastatin (CRESTOR) 5 MG tablet TAKE 1 TABLET BY MOUTH DAILY 30 tablet 6  . traZODone (DESYREL)  50 MG tablet TAKE 1 TABLET BY MOUTH DAILY 30 tablet 6   No current facility-administered medications for this visit.    VITAL SIGNS: There were no vitals taken for this visit. There were no vitals filed for this visit.  Estimated body mass index is 34.38 kg/m as calculated from the following:   Height as of 08/17/19: 5' 8"  (1.727 m).   Weight as of 08/17/19: 102.6 kg (226 lb 1.6 oz).  LABS: CBC:    Component Value Date/Time   WBC 3.2 (L) 08/06/2019 1403   HGB 11.9 (L) 08/06/2019 1403   HGB 14.2 02/11/2011 1349   HCT 34.6 (L) 08/06/2019 1403   HCT 40.9 02/11/2011 1349   PLT 180 08/06/2019 1403   PLT 151 02/11/2011 1349   MCV 91.1 08/06/2019 1403   MCV 89 02/11/2011 1349   NEUTROABS 1.9 08/06/2019 1403   LYMPHSABS 0.6 (L) 08/06/2019 1403   MONOABS 0.5 08/06/2019 1403   EOSABS 0.0 08/06/2019 1403   BASOSABS 0.1 08/06/2019 1403   Comprehensive Metabolic  Panel:    Component Value Date/Time   NA 133 (L) 08/06/2019 1403   NA 142 02/11/2011 1349   K 4.1 08/06/2019 1403   K 3.8 02/11/2011 1349   CL 101 08/06/2019 1403   CL 107 02/11/2011 1349   CO2 23 08/06/2019 1403   CO2 24 02/11/2011 1349   BUN 26 (H) 08/06/2019 1403   BUN 14 02/11/2011 1349   CREATININE 0.98 08/06/2019 1403   CREATININE 0.65 02/11/2011 1349   GLUCOSE 102 (H) 08/06/2019 1403   GLUCOSE 98 02/11/2011 1349   CALCIUM 9.1 08/06/2019 1403   CALCIUM 9.0 02/11/2011 1349   AST 36 08/06/2019 1403   AST 27 02/11/2011 1349   ALT 41 08/06/2019 1403   ALT 36 02/11/2011 1349   ALKPHOS 122 08/06/2019 1403   ALKPHOS 122 02/11/2011 1349   BILITOT 0.7 08/06/2019 1403   BILITOT 0.5 02/11/2011 1349   PROT 7.6 08/06/2019 1403   PROT 7.0 02/11/2011 1349   ALBUMIN 4.4 08/06/2019 1403   ALBUMIN 4.1 02/11/2011 1349    RADIOGRAPHIC STUDIES: MM 3D SCREEN BREAST UNI RIGHT  Result Date: 08/24/2019 CLINICAL DATA:  Screening. History of LEFT mastectomy in 2019. EXAM: DIGITAL SCREENING UNILATERAL RIGHT MAMMOGRAM WITH CAD AND TOMO COMPARISON:  Previous exam(s). ACR Breast Density Category b: There are scattered areas of fibroglandular density. FINDINGS: In the right breast, a possible focal asymmetry in the upper inner breast posterior depth warrants further evaluation. It is best seen on CC slice 12 and MLO slice 9. Status post LEFT mastectomy. Images were processed with CAD. IMPRESSION: Further evaluation is suggested for possible focal asymmetry in the right breast. This likely reflects scar from prior port. RECOMMENDATION: Diagnostic mammogram and possibly ultrasound of the right breast. (Code:[id number]) The patient will be contacted regarding the findings, and additional imaging will be scheduled. BI-RADS CATEGORY  0: Incomplete. Need additional imaging evaluation and/or prior mammograms for comparison. Electronically Signed   By: Valentino Saxon MD   On: 08/24/2019 14:20     Assessment  and Plan-  Reviewed CBC plan to continue Ibrance at 112m dosing. Patient starts cycle 3 tomorrow 09/04/19   Oral Chemotherapy Side Effect/Intolerance:  -Dizzness/foggyness: reported during cycle one improved during cycle two -Constipation: Patient is still able to effectively manage this and keep her bowel movements regular -During cycle 1 patient had nausea, but she reported no issues during cycle 2  Oral Chemotherapy Adherence: no reported missed doses  Medication  Access Issues: Ms. Olvera call [facility] and had her Ibrance delivered, she has medication on hand to start cycle 3  Patient expressed understanding and was in agreement with this plan. She also understands that She can call clinic at any time with any questions, concerns, or complaints.   Thank you for allowing me to participate in the care of this very pleasant patient.   Time Total: 10 mins  Visit consisted of counseling and education on dealing with issues of symptom management in the setting of serious and potentially life-threatening illness.Greater than 50%  of this time was spent counseling and coordinating care related to the above assessment and plan.  Signed by: Darl Pikes, PharmD, BCPS, Salley Slaughter, CPP Hematology/Oncology Clinical Pharmacist Practitioner [facility] [phone]  09/03/2019 9:54 AM

## 2019-09-03 NOTE — Progress Notes (Signed)
Patient here for follow up she is waiting for results.

## 2019-09-03 NOTE — Research (Signed)
[id number] ABC Protocol Treatment Consent Withdrawal:  Patient in to the cancer center for her scheduled appointment with Dr. Grayland Ormond today. Research RN confirmed with the patient that she did want to withdraw her consent from the protocol due to her change in condition recently. The withdrawal of consent and withdrawal of treatment consent were explained to the patient in detail at this time. She understands she can withdraw completely for treatment and follow up or just withdraw from the treatment portion and continue to allow the study to collect her health information only in follow up. The patient states she is willing to allow the study to continue to follow her since she isn't required to do anything. The patient signed withdrawal of consent for treatment on the protocol at 1415 pm today. The patient understands that research will continue to follow her care her in the cancer center for the protocol. Patient states she is to have more imaging on her breast this month. Research will continue to follow the patient. Approximately 30 minutes was spent in consultation with the patient. Jeral Fruit, RN, BSN, OCN 09/03/2019 1415 pm

## 2019-09-04 LAB — CANCER ANTIGEN 27.29: CA 27.29: 53 U/mL — ABNORMAL HIGH (ref 0.0–38.6)

## 2019-09-08 ENCOUNTER — Ambulatory Visit
Admission: RE | Admit: 2019-09-08 | Discharge: 2019-09-08 | Disposition: A | Discharge status: Home / Self Care | Payer: Medicare Other | Source: Ambulatory Visit | Attending: Oncology | Admitting: Oncology

## 2019-09-08 ENCOUNTER — Other Ambulatory Visit: Payer: Self-pay

## 2019-09-08 DIAGNOSIS — N6489 Other specified disorders of breast: Secondary | ICD-10-CM

## 2019-09-08 DIAGNOSIS — R928 Other abnormal and inconclusive findings on diagnostic imaging of breast: Secondary | ICD-10-CM | POA: Diagnosis not present

## 2019-09-08 DIAGNOSIS — N6001 Solitary cyst of right breast: Secondary | ICD-10-CM | POA: Diagnosis not present

## 2019-09-19 ENCOUNTER — Other Ambulatory Visit: Payer: Self-pay | Admitting: Oncology

## 2019-09-19 ENCOUNTER — Other Ambulatory Visit: Payer: Self-pay | Admitting: Internal Medicine

## 2019-09-27 DIAGNOSIS — C50919 Malignant neoplasm of unspecified site of unspecified female breast: Secondary | ICD-10-CM | POA: Insufficient documentation

## 2019-09-27 NOTE — Progress Notes (Signed)
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  INTERVAL HISTORY: Patient returns to clinic today for further evaluation and continuation of treatment.  She continues to tolerate Ibrance, Faslodex, and Xgeva well without significant side effects.  She has a mild cough and congestion today without fever.  She had one episode of diarrhea that resolved with Imodium.  She also complains of mild shortness of breath.  She continues to be anxious.  She does not complain of pain today.  She continues to have a mild peripheral neuropathy, but no other neurologic complaints.  She denies any chest pain or hemoptysis.  She denies any nausea, vomiting, or constipation.  She has no urinary complaints.  Patient offers no further specific complaints today.  REVIEW OF SYSTEMS:   Review of Systems  Constitutional: Negative for fever, malaise/fatigue and weight loss.  HENT: Positive for congestion.   Respiratory: Positive for cough and shortness of breath.   Cardiovascular: Negative.  Negative for chest pain and leg swelling.  Gastrointestinal: Positive for diarrhea. Negative for abdominal pain, constipation and nausea.  Genitourinary: Negative.  Negative for dysuria and urgency.  Musculoskeletal: Negative.  Negative for back pain and joint pain.  Skin: Negative.  Negative for rash.    Neurological: Positive for tingling and sensory change. Negative for dizziness, focal weakness, weakness and headaches.  Psychiatric/Behavioral: The patient is nervous/anxious.     As per HPI. Otherwise, a complete review of systems is negative.  PAST MEDICAL HISTORY: Past Medical History:  Diagnosis Date  . Anxiety   . Cancer ([place]) 07/2017   left breast  . Depression   . Hypertension   . Hypothyroidism   . Personal history of chemotherapy 2019   LEFT mastectomy-chemo before  . Personal history of radiation therapy 03/2018   LEFT mastectomy  . Rapid heart rate   . Thyroid disease     PAST SURGICAL HISTORY: Past Surgical History:  Procedure Laterality Date  . AXILLARY LYMPH NODE BIOPSY Left 07/16/2017   METASTATIC MAMMARY CARCINOMA  . BREAST BIOPSY Left 07/16/2017   [place] bx of left breast mass and left breast LN.  INVASIVE MAMMARY CARCINOMA, NO SPECIAL TYPE.   Marland Kitchen BREAST EXCISIONAL BIOPSY Right 2001   benign  . BREAST LUMPECTOMY WITH SENTINEL LYMPH NODE BIOPSY Left 12/06/2017   Procedure: BREAST LUMPECTOMY WITH SENTINEL LYMPH NODE BX;  Surgeon: Robert Bellow, MD;  Location: [facility] ORS;  Service: General;  Laterality: Left;  . COLONOSCOPY    . MASTECTOMY Left 12/23/2017  . PORTACATH PLACEMENT Right 08/07/2017   Procedure: INSERTION PORT-A-CATH;  Surgeon: Robert Bellow, MD;  Location: [facility] ORS;  Service: General;  Laterality: Right;  . SIMPLE MASTECTOMY WITH AXILLARY SENTINEL NODE BIOPSY Left 12/23/2017   T2,N2 with 6/7 nodes positive. Whole breast radiation.  Surgeon: Robert Bellow, MD;  Location: [facility] ORS;  Service: General;  Laterality: Left;  . TONSILLECTOMY      FAMILY HISTORY: Family History  Problem Relation Age of Onset  .  Stroke Mother   . Thyroid disease Mother   . Renal Disease Mother   . Stroke Father   . Heart attack Father   . Sudden death Father 38       suicide  . Anuerysm Brother   . Breast cancer Neg Hx     ADVANCED DIRECTIVES (Y/N):   N  HEALTH MAINTENANCE: Social History   Tobacco Use  . Smoking status: Current Some Day Smoker    Packs/day: 0.25    Years: 30.00    Pack years: 7.50    Types: Cigarettes  . Smokeless tobacco: Never Used  Vaping Use  . Vaping Use: Never used  Substance Use Topics  . Alcohol use: Yes    Comment: occasional q 22mo  . Drug use: Never     Colonoscopy:  PAP:  Bone density:  Lipid panel:  Allergies  Allergen Reactions  . Sulfa Antibiotics Diarrhea    Current Outpatient Medications  Medication Sig Dispense Refill  . acetaminophen (TYLENOL) 500 MG tablet Take 1,000 mg by mouth every 6 (six) hours as needed for moderate pain or headache.     . albuterol (PROVENTIL HFA;VENTOLIN HFA) 108 (90 Base) MCG/ACT inhaler Inhale 2 puffs into the lungs every 6 (six) hours as needed for wheezing or shortness of breath. 1 Inhaler 2  . ALPRAZolam (XANAX) 0.25 MG tablet Take 1 tablet (0.25 mg total) by mouth 2 (two) times daily. 30 tablet 2  . ALPRAZolam (XANAX) 0.5 MG tablet Take 1 tablet (0.5 mg total) by mouth 2 (two) times daily. 60 tablet 0  . amLODipine (NORVASC) 5 MG tablet TAKE 1 TABLET BY MOUTH DAILY 90 tablet 3  . aspirin EC 81 MG tablet Take 81 mg by mouth at bedtime.     .Marland KitchenbuPROPion (WELLBUTRIN SR) 150 MG 12 hr tablet Take 150 mg by mouth 2 (two) times daily.    . calcium carbonate (OS-CAL - DOSED IN MG OF ELEMENTAL CALCIUM) 1250 (500 Ca) MG tablet Take 1 tablet by mouth daily with breakfast.    . calcium-vitamin D (OSCAL WITH D) 250-125 MG-UNIT tablet Take 1 tablet by mouth 2 (two) times daily.    . Cholecalciferol (VITAMIN D3) 125 MCG (5000 UT) CAPS Take 1 capsule by mouth 2 (two) times a day.    . docusate sodium (COLACE) 100 MG capsule Take 100 mg by mouth 3 (three) times daily as needed for mild constipation.    .Marland Kitchenlevothyroxine (SYNTHROID) 100 MCG tablet TAKE 1 TABLET BY MOUTH DAILY 90 tablet 3  . lisinopril (ZESTRIL) 20 MG tablet Take 20 mg by mouth daily.    .Marland Kitchen losartan-hydrochlorothiazide (HYZAAR) 100-12.5 MG tablet Take 1 tablet by mouth daily.    . Magnesium Carbonate Heavy POWD by Does not apply route. Natural Vitality Calm magnesium supplement    . ondansetron (ZOFRAN) 8 MG tablet Take 1 tablet (8 mg total) by mouth every 8 (eight) hours as needed for nausea or vomiting. 20 tablet 0  . rosuvastatin (CRESTOR) 5 MG tablet TAKE 1 TABLET BY MOUTH DAILY 30 tablet 6  . traZODone (DESYREL) 50 MG tablet TAKE 1 TABLET BY MOUTH DAILY 30 tablet 6  . palbociclib (IBRANCE) 100 MG tablet Take 1 tablet (100 mg total) by mouth daily. Take for 21 days on, 7 days off, repeat every 28 days. 21 tablet 2   No current facility-administered medications for this visit.    OBJECTIVE: Vitals:   10/01/19 1402  BP: (!) 141/72  Pulse: (Marland Kitchen  107  Resp: 18  Temp: 97.8 F (36.6 C)  SpO2: 96%     Body mass index is 33.8 kg/m.    ECOG FS:0 - Asymptomatic  General: Well-developed, well-nourished, no acute distress. Eyes: Pink conjunctiva, anicteric sclera. HEENT: Normocephalic, moist mucous membranes. Lungs: No audible wheezing or coughing. Heart: Regular rate and rhythm. Abdomen: Soft, nontender, no obvious distention. Musculoskeletal: No edema, cyanosis, or clubbing. Neuro: Alert, answering all questions appropriately. Cranial nerves grossly intact. Skin: No rashes or petechiae noted. Psych: Normal affect.   LAB RESULTS:  Lab Results  Component Value Date   NA 133 (L) 10/01/2019   K 3.6 10/01/2019   CL 98 10/01/2019   CO2 24 10/01/2019   GLUCOSE 115 (H) 10/01/2019   BUN 19 10/01/2019   CREATININE 1.13 (H) 10/01/2019   CALCIUM 9.1 10/01/2019   PROT 7.3 10/01/2019   ALBUMIN 3.9 10/01/2019   AST 23 10/01/2019   ALT 17 10/01/2019   ALKPHOS 71 10/01/2019   BILITOT 0.8 10/01/2019   GFRNONAA 49 (L) 10/01/2019   GFRAA 57 (L) 10/01/2019    Lab Results  Component Value Date   WBC 2.2 (L) 10/01/2019   NEUTROABS 1.4 (L) 10/01/2019   HGB 11.0 (L)  10/01/2019   HCT 31.2 (L) 10/01/2019   MCV 92.9 10/01/2019   PLT 221 10/01/2019     STUDIES: US BREAST LTD UNI RIGHT INC AXILLA  Result Date: 09/08/2019 CLINICAL DATA:  Possible focal asymmetry in the upper inner right breast posteriorly on a recent screening mammogram, possibly due to a port scar. Status post left mastectomy for breast cancer in 2019. EXAM: DIGITAL DIAGNOSTIC RIGHT MAMMOGRAM WITH TOMO ULTRASOUND RIGHT BREAST COMPARISON:  Previous exam(s). ACR Breast Density Category b: There are scattered areas of fibroglandular density. FINDINGS: 3D tomographic and 2D generated spot compression views of the right breast confirm an oval, circumscribed, mass-like area at the location of the patient's port scar in the upper inner quadrant of the breast. This has mild central fat density. On physical exam, there is an approximately 4 mm rounded, circumscribed, palpable mass just beneath the skin at the inferolateral aspect of the patient's port scar in the far upper inner portion of the breast. Targeted ultrasound is performed, showing a 6 x 5 x 3 mm oval, horizontally oriented, circumscribed, hypoechoic mass with low-level internal echoes in the 1 o'clock position of the right breast, 10 cm from the nipple. This is just beneath the skin and abutting the posterior dermal layer at the location of the patient's port scar. No internal blood flow was seen with power Doppler. IMPRESSION: 6 mm oil cyst at the location of the patient's port scar in the 1 o'clock position of the right breast. No evidence of malignancy. RECOMMENDATION: Right screening mammogram in 1 year. I have discussed the findings and recommendations with the patient. If applicable, a reminder letter will be sent to the patient regarding the next appointment. BI-RADS CATEGORY  2: Benign. Electronically Signed   By: Claudie Revering M.D.   On: 09/08/2019 15:24   MM DIAG BREAST TOMO UNI RIGHT  Result Date: 09/08/2019 CLINICAL DATA:  Possible focal  asymmetry in the upper inner right breast posteriorly on a recent screening mammogram, possibly due to a port scar. Status post left mastectomy for breast cancer in 2019. EXAM: DIGITAL DIAGNOSTIC RIGHT MAMMOGRAM WITH TOMO ULTRASOUND RIGHT BREAST COMPARISON:  Previous exam(s). ACR Breast Density Category b: There are scattered areas of fibroglandular density. FINDINGS: 3D tomographic and 2D generated  spot compression views of the right breast confirm an oval, circumscribed, mass-like area at the location of the patient's port scar in the upper inner quadrant of the breast. This has mild central fat density. On physical exam, there is an approximately 4 mm rounded, circumscribed, palpable mass just beneath the skin at the inferolateral aspect of the patient's port scar in the far upper inner portion of the breast. Targeted ultrasound is performed, showing a 6 x 5 x 3 mm oval, horizontally oriented, circumscribed, hypoechoic mass with low-level internal echoes in the 1 o'clock position of the right breast, 10 cm from the nipple. This is just beneath the skin and abutting the posterior dermal layer at the location of the patient's port scar. No internal blood flow was seen with power Doppler. IMPRESSION: 6 mm oil cyst at the location of the patient's port scar in the 1 o'clock position of the right breast. No evidence of malignancy. RECOMMENDATION: Right screening mammogram in 1 year. I have discussed the findings and recommendations with the patient. If applicable, a reminder letter will be sent to the patient regarding the next appointment. BI-RADS CATEGORY  2: Benign. Electronically Signed   By: Claudie Revering M.D.   On: 09/08/2019 15:24   ONCOLOGY HISTORY: Patient initially received neoadjuvant chemotherapy with Adriamycin and Cytoxan followed by weekly Taxol. She only received 4 cycles of weekly Taxol prior to discontinuation of treatment on October 31, 2017 secondary to persistent peripheral neuropathy.  She  ultimately required mastectomy and final pathology noted 5 of 6 lymph nodes positive for disease.  Patient completed adjuvant XRT in mid April 2020.  Pain initiated letrozole in May 2020, this was discontinued secondary to side effects and patient was started on anastrozole.  Nuclear medicine bone scan on June 19, 2019 revealed metastatic disease and anastrozole was subsequently discontinued.  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  PLAN:    1. 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  MRI of the brain on August 24, 2019 reviewed independently with no obvious evidence of metastatic disease.  Patient's CA 27-29 initially increased to 141.9, but has now trended down to 56.2.  Continue 125 mg Ibrance for 21 days with 7 days off, but will dose reduce to 100 mg with next cycle given mild neutropenia.  Proceed with Faslodex and Xgeva today.  Return to clinic in 4 weeks for further evaluation and continuation of treatment.  Appreciate clinical pharmacy input.   2.  Peripheral neuropathy: Chronic and unchanged. 3.  Pulmonary embolus: Patient was diagnosed with a small pulmonary embolus on January 10, 2018.  She is no longer on anticoagulation. 4.  Osteopenia: Patient's most recent bone mineral density on March 03, 2019 reported a T score of -1.8 which is only mildly decreased from 1 year prior when the reported T score was -1.6.  Continue calcium and vitamin D supplementation.     5.  Left hip pain: Resolved.  Secondary to metastatic disease. Patient has completed XRT. 6.  Leukopenia: Secondary to Ibrance.  Dose reduced Ibrance as above. 7.  Cough/congestion/shortness of breath: Mild.  Recommended patient consider getting Covid tested. 8.  Diarrhea: Resolved with  Imodium.  Patient expressed understanding and was in agreement with this plan. She also understands that She can call clinic at any time with any  questions, concerns, or complaints.   Cancer Staging Breast cancer, stage 2, left ([place]) Staging form: Breast, AJCC 8th Edition - Clinical stage from 07/30/2017: Stage IIA (cT2, cN1, cM0, G2, ER+, PR+, HER2-) - Signed by Lloyd Huger, MD on 07/30/2017   Lloyd Huger, MD   10/02/2019 6:16 AM

## 2019-10-01 ENCOUNTER — Inpatient Hospital Stay (HOSPITAL_BASED_OUTPATIENT_CLINIC_OR_DEPARTMENT_OTHER): Payer: Medicare Other | Admitting: Oncology

## 2019-10-01 ENCOUNTER — Inpatient Hospital Stay: Payer: Medicare Other | Admitting: Pharmacist

## 2019-10-01 ENCOUNTER — Inpatient Hospital Stay: Payer: Medicare Other | Attending: Oncology

## 2019-10-01 ENCOUNTER — Inpatient Hospital Stay: Payer: Medicare Other

## 2019-10-01 ENCOUNTER — Other Ambulatory Visit: Payer: Self-pay

## 2019-10-01 DIAGNOSIS — C50919 Malignant neoplasm of unspecified site of unspecified female breast: Secondary | ICD-10-CM

## 2019-10-01 DIAGNOSIS — Z5111 Encounter for antineoplastic chemotherapy: Secondary | ICD-10-CM | POA: Diagnosis not present

## 2019-10-01 DIAGNOSIS — C7951 Secondary malignant neoplasm of bone: Secondary | ICD-10-CM | POA: Insufficient documentation

## 2019-10-01 DIAGNOSIS — C50912 Malignant neoplasm of unspecified site of left female breast: Secondary | ICD-10-CM | POA: Diagnosis not present

## 2019-10-01 LAB — CBC WITH DIFFERENTIAL/PLATELET
Abs Immature Granulocytes: 0.02 10*3/uL (ref 0.00–0.07)
Basophils Absolute: 0 10*3/uL (ref 0.0–0.1)
Basophils Relative: 2 %
Eosinophils Absolute: 0 10*3/uL (ref 0.0–0.5)
Eosinophils Relative: 2 %
HCT: 31.2 % — ABNORMAL LOW (ref 36.0–46.0)
Hemoglobin: 11 g/dL — ABNORMAL LOW (ref 12.0–15.0)
Immature Granulocytes: 1 %
Lymphocytes Relative: 13 %
Lymphs Abs: 0.3 10*3/uL — ABNORMAL LOW (ref 0.7–4.0)
MCH: 32.7 pg (ref 26.0–34.0)
MCHC: 35.3 g/dL (ref 30.0–36.0)
MCV: 92.9 fL (ref 80.0–100.0)
Monocytes Absolute: 0.4 10*3/uL (ref 0.1–1.0)
Monocytes Relative: 19 %
Neutro Abs: 1.4 10*3/uL — ABNORMAL LOW (ref 1.7–7.7)
Neutrophils Relative %: 63 %
Platelets: 221 10*3/uL (ref 150–400)
RBC: 3.36 MIL/uL — ABNORMAL LOW (ref 3.87–5.11)
RDW: 17.2 % — ABNORMAL HIGH (ref 11.5–15.5)
WBC: 2.2 10*3/uL — ABNORMAL LOW (ref 4.0–10.5)
nRBC: 0 % (ref 0.0–0.2)

## 2019-10-01 LAB — COMPREHENSIVE METABOLIC PANEL
ALT: 17 U/L (ref 0–44)
AST: 23 U/L (ref 15–41)
Albumin: 3.9 g/dL (ref 3.5–5.0)
Alkaline Phosphatase: 71 U/L (ref 38–126)
Anion gap: 11 (ref 5–15)
BUN: 19 mg/dL (ref 8–23)
CO2: 24 mmol/L (ref 22–32)
Calcium: 9.1 mg/dL (ref 8.9–10.3)
Chloride: 98 mmol/L (ref 98–111)
Creatinine, Ser: 1.13 mg/dL — ABNORMAL HIGH (ref 0.44–1.00)
GFR calc Af Amer: 57 mL/min — ABNORMAL LOW (ref >60)
GFR calc non Af Amer: 49 mL/min — ABNORMAL LOW (ref >60)
Glucose, Bld: 115 mg/dL — ABNORMAL HIGH (ref 70–99)
Potassium: 3.6 mmol/L (ref 3.5–5.1)
Sodium: 133 mmol/L — ABNORMAL LOW (ref 135–145)
Total Bilirubin: 0.8 mg/dL (ref 0.3–1.2)
Total Protein: 7.3 g/dL (ref 6.5–8.1)

## 2019-10-01 MED ORDER — PALBOCICLIB 100 MG PO TABS: 100.0000 mg | ORAL_TABLET | Freq: Every day | ORAL | 2 refills | Status: DC

## 2019-10-01 MED ORDER — DENOSUMAB 120 MG/1.7ML ~~LOC~~ SOLN
120.0000 mg | Freq: Once | SUBCUTANEOUS | Status: AC
  Administered 2019-10-01: 120 mg via SUBCUTANEOUS
  Filled 2019-10-01: qty 1.7

## 2019-10-01 MED ORDER — FULVESTRANT 250 MG/5ML IM SOLN
500.0000 mg | Freq: Once | INTRAMUSCULAR | Status: AC
  Administered 2019-10-01: 500 mg via INTRAMUSCULAR
  Filled 2019-10-01: qty 10

## 2019-10-01 NOTE — Progress Notes (Signed)
[facility]  Telephone:([phone] [phone]  Patient Care Team: [name], MD as PCP - General (Internal Medicine) Rico Junker, RN as Registered Nurse Lloyd Huger, MD as Consulting Physician (Oncology) Bary Castilla Forest Gleason, MD as Consulting Physician (General Surgery) Bary Castilla Forest Gleason, MD as Consulting Physician (General Surgery) Noreene Filbert, MD as Referring Physician (Radiation Oncology) Jeral Fruit, RN as Registered Nurse   Name of the patient: Alicia [name]  [id number]  1950-09-22   Date of visit: 10/01/19  HPI: Patient is a 69 y.o. female with recurrent stage IV ER/PR positive, HER2 negative breast cancer. Patient started on Ibrance on 07/09/19.  Reason for Consult: Oral chemotherapy follow-up for Ibrance (palbociclib) therapy, s/p cycle three.   PAST MEDICAL HISTORY: Past Medical History:  Diagnosis Date  . Anxiety   . Cancer ([place]) 07/2017   left breast  . Depression   . Hypertension   . Hypothyroidism   . Personal history of chemotherapy 2019   LEFT mastectomy-chemo before  . Personal history of radiation therapy 03/2018   LEFT mastectomy  . Rapid heart rate   . Thyroid disease     PAST SURGICAL HISTORY:  Past Surgical History:  Procedure Laterality Date  . AXILLARY LYMPH NODE BIOPSY Left 07/16/2017   METASTATIC MAMMARY CARCINOMA  . BREAST BIOPSY Left 07/16/2017   [place] bx of left breast mass and left breast LN.  INVASIVE MAMMARY CARCINOMA, NO SPECIAL TYPE.   Marland Kitchen BREAST EXCISIONAL BIOPSY Right 2001   benign  . BREAST LUMPECTOMY WITH SENTINEL LYMPH NODE BIOPSY Left 12/06/2017   Procedure: BREAST LUMPECTOMY WITH SENTINEL LYMPH NODE BX;  Surgeon: Robert Bellow, MD;  Location: [facility] ORS;  Service: General;  Laterality: Left;  . COLONOSCOPY    . MASTECTOMY Left 12/23/2017  . PORTACATH PLACEMENT Right 08/07/2017   Procedure: INSERTION PORT-A-CATH;  Surgeon: Robert Bellow,  MD;  Location: [facility] ORS;  Service: General;  Laterality: Right;  . SIMPLE MASTECTOMY WITH AXILLARY SENTINEL NODE BIOPSY Left 12/23/2017   T2,N2 with 6/7 nodes positive. Whole breast radiation.  Surgeon: Robert Bellow, MD;  Location: [facility] ORS;  Service: General;  Laterality: Left;  . TONSILLECTOMY      HEMATOLOGY/ONCOLOGY HISTORY:  Oncology History Overview Note  Patient is a 69 year old female who recently self palpated a mass on her left breast.  Subsequent imaging and biopsy revealed the above-stated breast cancer.  Case was also discussed extensively at case conference.  Given the size and the stage of patient's malignancy, she will benefit from neoadjuvant chemotherapy using Adriamycin, Cytoxan, and Taxol.  Patient will also require Neulasta support.  Will get CT scan of the chest, abdomen, and pelvis to assess for any metastatic disease.  Patient will also require port placement and MUGA prior to initiating treatment  CT abdomen/pelvis/chest did not reveal any suspicious lesions concerning for metastatic disease. (08/01/17)  Port-A-Cath placed on 08/07/2017.  Cycle 1 day 1 of AC was given on 08/08/17.     Breast cancer, stage 2, left ([place])  07/24/2017 Initial Diagnosis   Breast cancer, stage 2, left ([place])   07/30/2017 Cancer Staging   Staging form: Breast, AJCC 8th Edition - Clinical stage from 07/30/2017: Stage IIA (cT2, cN1, cM0, G2, ER+, PR+, HER2-) - Signed by Lloyd Huger, MD on 07/30/2017   08/08/2017 - 10/31/2017 Chemotherapy   The patient had DOXOrubicin (ADRIAMYCIN) chemo injection 130 mg, 60 mg/m2 = 130 mg, Intravenous,  Once, 4 of 4 cycles  Administration: 130 mg (08/08/2017), 130 mg (08/22/2017), 130 mg (09/05/2017), 130 mg (09/19/2017) palonosetron (ALOXI) injection 0.25 mg, 0.25 mg, Intravenous,  Once, 4 of 4 cycles Administration: 0.25 mg (08/08/2017), 0.25 mg (08/22/2017), 0.25 mg (09/05/2017), 0.25 mg (09/19/2017) pegfilgrastim-cbqv (UDENYCA) injection 6 mg, 6 mg,  Subcutaneous, Once, 4 of 4 cycles Administration: 6 mg (08/09/2017), 6 mg (08/23/2017), 6 mg (09/06/2017), 6 mg (09/20/2017) cyclophosphamide (CYTOXAN) 1,300 mg in sodium chloride 0.9 % 250 mL chemo infusion, 600 mg/m2 = 1,300 mg, Intravenous,  Once, 4 of 4 cycles Administration: 1,300 mg (08/08/2017), 1,300 mg (08/22/2017), 1,300 mg (09/05/2017), 1,300 mg (09/19/2017) PACLitaxel (TAXOL) 174 mg in sodium chloride 0.9 % 250 mL chemo infusion (</= 71m/m2), 80 mg/m2 = 174 mg, Intravenous,  Once, 4 of 12 cycles Dose modification: 72 mg/m2 (original dose 80 mg/m2, Cycle 6, Reason: Dose not tolerated) Administration: 174 mg (10/03/2017), 156 mg (10/17/2017), 156 mg (10/24/2017), 156 mg (10/31/2017) fosaprepitant (EMEND) 150 mg, dexamethasone (DECADRON) 12 mg in sodium chloride 0.9 % 145 mL IVPB, , Intravenous,  Once, 4 of 4 cycles Administration:  (08/08/2017),  (08/22/2017),  (09/05/2017),  (09/19/2017)  for chemotherapy treatment.      ALLERGIES:  is allergic to sulfa antibiotics.  MEDICATIONS:  Current Outpatient Medications  Medication Sig Dispense Refill  . acetaminophen (TYLENOL) 500 MG tablet Take 1,000 mg by mouth every 6 (six) hours as needed for moderate pain or headache.     . albuterol (PROVENTIL HFA;VENTOLIN HFA) 108 (90 Base) MCG/ACT inhaler Inhale 2 puffs into the lungs every 6 (six) hours as needed for wheezing or shortness of breath. 1 Inhaler 2  . ALPRAZolam (XANAX) 0.25 MG tablet Take 1 tablet (0.25 mg total) by mouth 2 (two) times daily. 30 tablet 2  . ALPRAZolam (XANAX) 0.5 MG tablet Take 1 tablet (0.5 mg total) by mouth 2 (two) times daily. 60 tablet 0  . amLODipine (NORVASC) 5 MG tablet TAKE 1 TABLET BY MOUTH DAILY 90 tablet 3  . aspirin EC 81 MG tablet Take 81 mg by mouth at bedtime.     .Marland KitchenbuPROPion (WELLBUTRIN SR) 150 MG 12 hr tablet Take 150 mg by mouth 2 (two) times daily.    . calcium carbonate (OS-CAL - DOSED IN MG OF ELEMENTAL CALCIUM) 1250 (500 Ca) MG tablet Take 1 tablet by mouth  daily with breakfast.    . calcium-vitamin D (OSCAL WITH D) 250-125 MG-UNIT tablet Take 1 tablet by mouth 2 (two) times daily.    . Cholecalciferol (VITAMIN D3) 125 MCG (5000 UT) CAPS Take 1 capsule by mouth 2 (two) times a day.    . docusate sodium (COLACE) 100 MG capsule Take 100 mg by mouth 3 (three) times daily as needed for mild constipation.    .Marland Kitchenlevothyroxine (SYNTHROID) 100 MCG tablet TAKE 1 TABLET BY MOUTH DAILY 90 tablet 3  . lisinopril (ZESTRIL) 20 MG tablet Take 20 mg by mouth daily.    .Marland Kitchenlosartan-hydrochlorothiazide (HYZAAR) 100-12.5 MG tablet Take 1 tablet by mouth daily.    . Magnesium Carbonate Heavy POWD by Does not apply route. Natural Vitality Calm magnesium supplement    . ondansetron (ZOFRAN) 8 MG tablet Take 1 tablet (8 mg total) by mouth every 8 (eight) hours as needed for nausea or vomiting. 20 tablet 0  . palbociclib (IBRANCE) 125 MG tablet Take 1 tablet (125 mg total) by mouth daily. Take for 21 days on, 7 days off, repeat every 28 days. 21 tablet 0  . rosuvastatin (CRESTOR) 5 MG tablet  TAKE 1 TABLET BY MOUTH DAILY 30 tablet 6  . traZODone (DESYREL) 50 MG tablet TAKE 1 TABLET BY MOUTH DAILY 30 tablet 6   No current facility-administered medications for this visit.    VITAL SIGNS: There were no vitals taken for this visit. There were no vitals filed for this visit.  Estimated body mass index is 34.18 kg/m as calculated from the following:   Height as of 08/17/19: 5' 8"  (1.727 m).   Weight as of 09/03/19: 102 kg (224 lb 12.8 oz).  LABS: CBC:    Component Value Date/Time   WBC 2.6 (L) 09/03/2019 1334   HGB 11.7 (L) 09/03/2019 1334   HGB 14.2 02/11/2011 1349   HCT 33.5 (L) 09/03/2019 1334   HCT 40.9 02/11/2011 1349   PLT 167 09/03/2019 1334   PLT 151 02/11/2011 1349   MCV 92.8 09/03/2019 1334   MCV 89 02/11/2011 1349   NEUTROABS 1.6 (L) 09/03/2019 1334   LYMPHSABS 0.5 (L) 09/03/2019 1334   MONOABS 0.4 09/03/2019 1334   EOSABS 0.0 09/03/2019 1334   BASOSABS  0.0 09/03/2019 1334   Comprehensive Metabolic Panel:    Component Value Date/Time   NA 136 09/03/2019 1334   NA 142 02/11/2011 1349   K 3.9 09/03/2019 1334   K 3.8 02/11/2011 1349   CL 101 09/03/2019 1334   CL 107 02/11/2011 1349   CO2 26 09/03/2019 1334   CO2 24 02/11/2011 1349   BUN 18 09/03/2019 1334   BUN 14 02/11/2011 1349   CREATININE 1.07 (H) 09/03/2019 1334   CREATININE 0.65 02/11/2011 1349   GLUCOSE 108 (H) 09/03/2019 1334   GLUCOSE 98 02/11/2011 1349   CALCIUM 9.4 09/03/2019 1334   CALCIUM 9.0 02/11/2011 1349   AST 20 09/03/2019 1334   AST 27 02/11/2011 1349   ALT 18 09/03/2019 1334   ALT 36 02/11/2011 1349   ALKPHOS 98 09/03/2019 1334   ALKPHOS 122 02/11/2011 1349   BILITOT 0.6 09/03/2019 1334   BILITOT 0.5 02/11/2011 1349   PROT 7.9 09/03/2019 1334   PROT 7.0 02/11/2011 1349   ALBUMIN 4.5 09/03/2019 1334   ALBUMIN 4.1 02/11/2011 1349    RADIOGRAPHIC STUDIES: US BREAST LTD UNI RIGHT INC AXILLA  Result Date: 09/08/2019 CLINICAL DATA:  Possible focal asymmetry in the upper inner right breast posteriorly on a recent screening mammogram, possibly due to a port scar. Status post left mastectomy for breast cancer in 2019. EXAM: DIGITAL DIAGNOSTIC RIGHT MAMMOGRAM WITH TOMO ULTRASOUND RIGHT BREAST COMPARISON:  Previous exam(s). ACR Breast Density Category b: There are scattered areas of fibroglandular density. FINDINGS: 3D tomographic and 2D generated spot compression views of the right breast confirm an oval, circumscribed, mass-like area at the location of the patient's port scar in the upper inner quadrant of the breast. This has mild central fat density. On physical exam, there is an approximately 4 mm rounded, circumscribed, palpable mass just beneath the skin at the inferolateral aspect of the patient's port scar in the far upper inner portion of the breast. Targeted ultrasound is performed, showing a 6 x 5 x 3 mm oval, horizontally oriented, circumscribed, hypoechoic  mass with low-level internal echoes in the 1 o'clock position of the right breast, 10 cm from the nipple. This is just beneath the skin and abutting the posterior dermal layer at the location of the patient's port scar. No internal blood flow was seen with power Doppler. IMPRESSION: 6 mm oil cyst at the location of the patient's port scar  in the 1 o'clock position of the right breast. No evidence of malignancy. RECOMMENDATION: Right screening mammogram in 1 year. I have discussed the findings and recommendations with the patient. If applicable, a reminder letter will be sent to the patient regarding the next appointment. BI-RADS CATEGORY  2: Benign. Electronically Signed   By: Claudie Revering M.D.   On: 09/08/2019 15:24   MM DIAG BREAST TOMO UNI RIGHT  Result Date: 09/08/2019 CLINICAL DATA:  Possible focal asymmetry in the upper inner right breast posteriorly on a recent screening mammogram, possibly due to a port scar. Status post left mastectomy for breast cancer in 2019. EXAM: DIGITAL DIAGNOSTIC RIGHT MAMMOGRAM WITH TOMO ULTRASOUND RIGHT BREAST COMPARISON:  Previous exam(s). ACR Breast Density Category b: There are scattered areas of fibroglandular density. FINDINGS: 3D tomographic and 2D generated spot compression views of the right breast confirm an oval, circumscribed, mass-like area at the location of the patient's port scar in the upper inner quadrant of the breast. This has mild central fat density. On physical exam, there is an approximately 4 mm rounded, circumscribed, palpable mass just beneath the skin at the inferolateral aspect of the patient's port scar in the far upper inner portion of the breast. Targeted ultrasound is performed, showing a 6 x 5 x 3 mm oval, horizontally oriented, circumscribed, hypoechoic mass with low-level internal echoes in the 1 o'clock position of the right breast, 10 cm from the nipple. This is just beneath the skin and abutting the posterior dermal layer at the location of  the patient's port scar. No internal blood flow was seen with power Doppler. IMPRESSION: 6 mm oil cyst at the location of the patient's port scar in the 1 o'clock position of the right breast. No evidence of malignancy. RECOMMENDATION: Right screening mammogram in 1 year. I have discussed the findings and recommendations with the patient. If applicable, a reminder letter will be sent to the patient regarding the next appointment. BI-RADS CATEGORY  2: Benign. Electronically Signed   By: Claudie Revering M.D.   On: 09/08/2019 15:24     Assessment and Plan-  Reviewed CBC plan to continue Ibrance at 181m dosing, but plan to decrease to 100 mg for subsequent cycle due to patient-reported fatigue. Patient starts cycle 4 tomorrow.    Oral Chemotherapy Side Effect/Intolerance:  - Endorsed one episode of diarrhea today that resolved with Imodium administration.  - Nausea today, resolved with Zofran use.  Oral Chemotherapy Adherence: No reported missed doses  Medication Access Issues: Ms. [facility] and had her Ibrance delivered, she has medication on hand to start cycle 4. Prescription for Ibrance 100 mg dose reduction sent to [facility]. Ms. HGaydenknows at next refill to request 100 mg tablets.  Other: Endorsed mild chills, fatigue, and malaise. She was encouraged to get COVID-19 tested and provided [facility] testing site information. She also wondered if this was related to seasonal allergies as she's recently been outside mowing.  Patient expressed understanding and was in agreement with this plan. She also understands that She can call clinic at any time with any questions, concerns, or complaints.   Thank you for allowing me to participate in the care of this very pleasant patient.   Time Total: 10 mins  Visit consisted of counseling and education on dealing with issues of symptom management in the setting of serious and potentially life-threatening illness.Greater than 50%  of this time  was spent counseling and coordinating care related to the above assessment and plan.  Signed by: Darl Pikes, PharmD, BCPS, Salley Slaughter, CPP Hematology/Oncology Clinical Pharmacist Practitioner [facility] [phone]  10/01/2019 12:14 PM

## 2019-10-01 NOTE — Progress Notes (Signed)
Patient here today for follow up regarding breast cancer. Patient reports she is not feeling well overall today, over the past few days she has developed congestion, shortness of breath, loss of appetite, changes in taste and diarrhea. Patient reports at one point she was having chills but denies having fever.

## 2019-10-02 LAB — CANCER ANTIGEN 27.29: CA 27.29: 56.2 U/mL — ABNORMAL HIGH (ref 0.0–38.6)

## 2019-10-03 ENCOUNTER — Other Ambulatory Visit: Payer: Self-pay | Admitting: Internal Medicine

## 2019-10-22 NOTE — Progress Notes (Signed)
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  INTERVAL HISTORY: Patient returns to clinic today for further evaluation and continuation of treatment.  She was evaluated in the emergency room approximately 1 week ago, but her symptoms appear to be related to anxiety.  She continues to tolerate Ibrance, Faslodex, and Xgeva well without significant side effects.  She continues to be anxious, but otherwise feels well today.  She does not complain of pain today.  She continues to have a mild peripheral neuropathy, but no other neurologic complaints.  She denies any chest pain, shortness of breath, cough, or hemoptysis.  She denies any nausea, vomiting, constipation, or diarrhea.  She has no urinary complaints.  Patient offers no further specific complaints today.  REVIEW OF SYSTEMS:   Review of Systems  Constitutional: Negative for fever, malaise/fatigue and weight loss.  HENT: Negative for congestion.   Respiratory: Negative.  Negative for cough and shortness of breath.   Cardiovascular: Negative.  Negative for chest pain and leg swelling.  Gastrointestinal: Negative.  Negative for abdominal pain, constipation, diarrhea and nausea.  Genitourinary: Negative.  Negative for dysuria and urgency.  Musculoskeletal: Negative.  Negative for back pain and joint pain.  Skin: Negative.   Negative for rash.  Neurological: Positive for tingling and sensory change. Negative for dizziness, focal weakness, weakness and headaches.  Psychiatric/Behavioral: The patient is nervous/anxious.     As per HPI. Otherwise, a complete review of systems is negative.  PAST MEDICAL HISTORY: Past Medical History:  Diagnosis Date  . Anxiety   . Cancer ([place]) 07/2017   left breast  . Depression   . Hypertension   . Hypothyroidism   . Personal history of chemotherapy 2019   LEFT mastectomy-chemo before  . Personal history of radiation therapy 03/2018   LEFT mastectomy  . Rapid heart rate   . Thyroid disease     PAST SURGICAL HISTORY: Past Surgical History:  Procedure Laterality Date  . AXILLARY LYMPH NODE BIOPSY Left 07/16/2017   METASTATIC MAMMARY CARCINOMA  . BREAST BIOPSY Left 07/16/2017   [place] bx of left breast mass and left breast LN.  INVASIVE MAMMARY CARCINOMA, NO SPECIAL TYPE.   Marland Kitchen BREAST EXCISIONAL BIOPSY Right 2001   benign  . BREAST LUMPECTOMY WITH SENTINEL LYMPH NODE BIOPSY Left 12/06/2017   Procedure: BREAST LUMPECTOMY WITH SENTINEL LYMPH NODE BX;  Surgeon: Robert Bellow, MD;  Location: [facility] ORS;  Service: General;  Laterality: Left;  . COLONOSCOPY    . MASTECTOMY Left 12/23/2017  . PORTACATH PLACEMENT Right 08/07/2017   Procedure: INSERTION PORT-A-CATH;  Surgeon: Robert Bellow, MD;  Location: [facility] ORS;  Service: General;  Laterality: Right;  . SIMPLE MASTECTOMY WITH AXILLARY SENTINEL NODE BIOPSY Left 12/23/2017   T2,N2 with 6/7 nodes positive. Whole breast radiation.  Surgeon: Robert Bellow, MD;  Location: [facility] ORS;  Service: General;  Laterality: Left;  . TONSILLECTOMY      FAMILY HISTORY: Family History  Problem Relation Age of Onset  .  Stroke Mother   . Thyroid disease Mother   . Renal Disease Mother   . Stroke Father   . Heart attack Father   . Sudden death Father 41       suicide  . Anuerysm Brother   . Breast cancer Neg Hx     ADVANCED  DIRECTIVES (Y/N):  N  HEALTH MAINTENANCE: Social History   Tobacco Use  . Smoking status: Current Some Day Smoker    Packs/day: 0.25    Years: 30.00    Pack years: 7.50    Types: Cigarettes  . Smokeless tobacco: Never Used  Vaping Use  . Vaping Use: Never used  Substance Use Topics  . Alcohol use: Yes    Comment: occasional q 60mo  . Drug use: Never     Colonoscopy:  PAP:  Bone density:  Lipid panel:  Allergies  Allergen Reactions  . Sulfa Antibiotics Diarrhea    Current Outpatient Medications  Medication Sig Dispense Refill  . acetaminophen (TYLENOL) 500 MG tablet Take 1,000 mg by mouth every 6 (six) hours as needed for moderate pain or headache.     . albuterol (PROVENTIL HFA;VENTOLIN HFA) 108 (90 Base) MCG/ACT inhaler Inhale 2 puffs into the lungs every 6 (six) hours as needed for wheezing or shortness of breath. 1 Inhaler 2  . ALPRAZolam (XANAX) 0.5 MG tablet Take 1 tablet (0.5 mg total) by mouth 2 (two) times daily. 60 tablet 0  . amLODipine (NORVASC) 5 MG tablet TAKE 1 TABLET BY MOUTH DAILY 90 tablet 3  . aspirin EC 81 MG tablet Take 81 mg by mouth at bedtime.     .Marland KitchenbuPROPion (WELLBUTRIN SR) 150 MG 12 hr tablet Take 150 mg by mouth 2 (two) times daily.    . calcium carbonate (OS-CAL - DOSED IN MG OF ELEMENTAL CALCIUM) 1250 (500 Ca) MG tablet Take 1 tablet by mouth daily with breakfast.    . calcium-vitamin D (OSCAL WITH D) 250-125 MG-UNIT tablet Take 1 tablet by mouth 2 (two) times daily.    . Cholecalciferol (VITAMIN D3) 125 MCG (5000 UT) CAPS Take 1 capsule by mouth 2 (two) times a day.    . docusate sodium (COLACE) 100 MG capsule Take 100 mg by mouth 3 (three) times daily as needed for mild constipation.    .Marland Kitchenlevothyroxine (SYNTHROID) 100 MCG tablet TAKE 1 TABLET BY MOUTH DAILY 90 tablet 3  . lisinopril (ZESTRIL) 20 MG tablet Take 20 mg by mouth daily.    .Marland Kitchenlosartan-hydrochlorothiazide (HYZAAR) 100-12.5 MG tablet TAKE 1 TABLET BY MOUTH DAILY 30 tablet 6  .  Magnesium Carbonate Heavy POWD by Does not apply route. Natural Vitality Calm magnesium supplement    . ondansetron (ZOFRAN) 8 MG tablet Take 1 tablet (8 mg total) by mouth every 8 (eight) hours as needed for nausea or vomiting. 20 tablet 0  . potassium chloride (KLOR-CON) 10 MEQ tablet Take 1 tablet (10 mEq total) by mouth daily for 2 days. 2 tablet 0  . rosuvastatin (CRESTOR) 5 MG tablet TAKE 1 TABLET BY MOUTH DAILY 30 tablet 6  . traZODone (DESYREL) 50 MG tablet TAKE 1 TABLET BY MOUTH DAILY 30 tablet 6  . palbociclib (IBRANCE) 125 MG tablet Take 1 tablet (125 mg total) by mouth daily. Take for 21 days on, 7 days off, repeat every 28 days. 21 tablet 3   No current facility-administered medications for this visit.    OBJECTIVE: Vitals:   10/29/19 1408  BP: 110/60  Pulse:  83  Resp: 18  Temp: (!) 97.2 F (36.2 C)     Body mass index is 36.94 kg/m.    ECOG FS:0 - Asymptomatic  General: Well-developed, well-nourished, no acute distress. Eyes: Pink conjunctiva, anicteric sclera. HEENT: Normocephalic, moist mucous membranes. Lungs: No audible wheezing or coughing. Heart: Regular rate and rhythm. Abdomen: Soft, nontender, no obvious distention. Musculoskeletal: No edema, cyanosis, or clubbing. Neuro: Alert, answering all questions appropriately. Cranial nerves grossly intact. Skin: No rashes or petechiae noted. Psych: Normal affect.   LAB RESULTS:  Lab Results  Component Value Date   NA 137 10/29/2019   K 4.2 10/29/2019   CL 104 10/29/2019   CO2 25 10/29/2019   GLUCOSE 92 10/29/2019   BUN 19 10/29/2019   CREATININE 1.24 (H) 10/29/2019   CALCIUM 8.9 10/29/2019   PROT 7.3 10/29/2019   ALBUMIN 4.2 10/29/2019   AST 22 10/29/2019   ALT 19 10/29/2019   ALKPHOS 65 10/29/2019   BILITOT 0.8 10/29/2019   GFRNONAA 44 (L) 10/29/2019   GFRAA 57 (L) 10/01/2019    Lab Results  Component Value Date   WBC 3.1 (L) 10/29/2019   NEUTROABS 1.9 10/29/2019   HGB 11.0 (L) 10/29/2019    HCT 31.9 (L) 10/29/2019   MCV 101.6 (H) 10/29/2019   PLT 170 10/29/2019     STUDIES: DG Chest 2 View  Result Date: 10/27/2019 CLINICAL DATA:  Chest pain EXAM: CHEST - 2 VIEW COMPARISON:  01/10/2018 FINDINGS: The right sided Port-A-Cath has been removed in the interim from the prior exam. The heart size and mediastinal contours are within normal limits. Stable calcified granuloma at the right lung base. No focal airspace consolidation, pleural effusion, or pneumothorax. Old posterior right sixth rib fracture. No acute osseous findings. IMPRESSION: No active cardiopulmonary disease. Electronically Signed   By: Davina Poke D.O.   On: 10/27/2019 12:49   CT Angio Chest PE W and/or Wo Contrast  Result Date: 10/27/2019 CLINICAL DATA:  69 year old female with chest pain onset at 1000 hours. Palpitations. Dizziness. EXAM: CT ANGIOGRAPHY CHEST WITH CONTRAST TECHNIQUE: Multidetector CT imaging of the chest was performed using the standard protocol during bolus administration of intravenous contrast. Multiplanar CT image reconstructions and MIPs were obtained to evaluate the vascular anatomy. CONTRAST:  54m OMNIPAQUE IOHEXOL 350 MG/ML SOLN COMPARISON:  Chest radiographs today.  Chest CTA 01/10/2018. FINDINGS: Cardiovascular: Good contrast bolus timing in the pulmonary arterial tree. No focal filling defect identified in the pulmonary arteries to suggest acute pulmonary embolism. Mild Calcified aortic atherosclerosis. No calcified coronary artery atherosclerosis is evident. No cardiomegaly or pericardial effusion. Right chest Port-A-Cath has been removed since 2019. Mediastinum/Nodes: No mediastinal lymphadenopathy. Possible small gastric hiatal hernia. Lungs/Pleura: Major airways are patent. Chronic upper lobe centrilobular emphysema. Anterior left upper lobe subpleural scarring has developed since 2019, likely post radiation related in the setting of previous left mastectomy. Mild dependent atelectasis  today. Chronic calcified granuloma and mild regional scarring in the right lower lobe is stable. Upper Abdomen: Negative visible upper abdominal viscera. Musculoskeletal: Previous left mastectomy. No acute or suspicious osseous lesion identified. Review of the MIP images confirms the above findings. IMPRESSION: 1. Negative for acute pulmonary embolus. 2. Emphysema (ICD10-J43.9). New left upper lobe scarring since 2019, probably post radiation related. Stable post granulomatous scarring in the right lower lobe. Electronically Signed   By: HGenevie AnnM.D.   On: 10/27/2019 17:29   ONCOLOGY HISTORY: Patient initially received neoadjuvant chemotherapy with Adriamycin and Cytoxan followed by weekly Taxol.  She only received 4 cycles of weekly Taxol prior to discontinuation of treatment on October 31, 2017 secondary to persistent peripheral neuropathy.  She ultimately required mastectomy and final pathology noted 5 of 6 lymph nodes positive for disease.  Patient completed adjuvant XRT in mid April 2020.  Pain initiated letrozole in May 2020, this was discontinued secondary to side effects and patient was started on anastrozole.  Nuclear medicine bone scan on June 19, 2019 revealed metastatic disease and anastrozole was subsequently discontinued.  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  PLAN:    1. 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  MRI of the brain on August 24, 2019 reviewed independently with no obvious evidence of metastatic disease.  Patient's CA 27-29 initially increased to 141.9, but  trended down to 56.2.  Her most recent result is slightly increased to 69.6.  Continue 125 mg Ibrance for 21 days with 7 days off.  Initially plan to dose reduce, but will continue  with current dose at this time.  Proceed with Faslodex and Xgeva as well today.  Return to clinic in 4 weeks for further evaluation and continuation of treatment.  Appreciate clinical pharmacy input.   2.  Peripheral neuropathy: Chronic and unchanged. 3.  Pulmonary embolus: Patient was diagnosed with a small pulmonary embolus on January 10, 2018.  She is no longer on anticoagulation.  There is no evidence of recurrent PE on CT scan from October 27, 2019. 4.  Osteopenia: Patient's most recent bone mineral density on March 03, 2019 reported a T score of -1.8 which is only mildly decreased from 1 year prior when the reported T score was -1.6.  Continue calcium and vitamin D supplementation.  Xgeva as above. 5.  Left hip pain: Resolved.  Secondary to metastatic disease. Patient has completed XRT. 6.  Leukopenia: Chronic and unchanged.  Secondary to Ibrance. 7.  Cough/congestion/shortness of breath: Resolved.   8.  Diarrhea: Resolved with Imodium.  Patient expressed understanding and was in agreement with this plan. She also understands that She can call clinic at any time with any questions, concerns, or complaints.   Cancer Staging Breast cancer, stage 2, left ([place]) Staging form: Breast, AJCC 8th Edition - Clinical stage from 07/30/2017: Stage IIA (cT2, cN1, cM0, G2, ER+, PR+, HER2-) - Signed by Lloyd Huger, MD on 07/30/2017   Lloyd Huger, MD   10/30/2019 6:56 AM

## 2019-10-27 ENCOUNTER — Emergency Department: Payer: Medicare Other

## 2019-10-27 ENCOUNTER — Other Ambulatory Visit: Payer: Self-pay

## 2019-10-27 ENCOUNTER — Ambulatory Visit: Payer: Medicare Other | Admitting: Internal Medicine

## 2019-10-27 ENCOUNTER — Emergency Department
Admission: EM | Admit: 2019-10-27 | Discharge: 2019-10-27 | Disposition: A | Discharge status: Home / Self Care | Payer: Medicare Other | Attending: Emergency Medicine | Admitting: Emergency Medicine

## 2019-10-27 DIAGNOSIS — F17210 Nicotine dependence, cigarettes, uncomplicated: Secondary | ICD-10-CM | POA: Insufficient documentation

## 2019-10-27 DIAGNOSIS — I10 Essential (primary) hypertension: Secondary | ICD-10-CM | POA: Diagnosis not present

## 2019-10-27 DIAGNOSIS — Z853 Personal history of malignant neoplasm of breast: Secondary | ICD-10-CM | POA: Diagnosis not present

## 2019-10-27 DIAGNOSIS — R002 Palpitations: Secondary | ICD-10-CM | POA: Insufficient documentation

## 2019-10-27 DIAGNOSIS — E039 Hypothyroidism, unspecified: Secondary | ICD-10-CM | POA: Diagnosis not present

## 2019-10-27 DIAGNOSIS — Z79899 Other long term (current) drug therapy: Secondary | ICD-10-CM | POA: Diagnosis not present

## 2019-10-27 DIAGNOSIS — R0789 Other chest pain: Secondary | ICD-10-CM | POA: Insufficient documentation

## 2019-10-27 DIAGNOSIS — I499 Cardiac arrhythmia, unspecified: Secondary | ICD-10-CM | POA: Diagnosis not present

## 2019-10-27 DIAGNOSIS — R079 Chest pain, unspecified: Secondary | ICD-10-CM | POA: Diagnosis not present

## 2019-10-27 LAB — BASIC METABOLIC PANEL
Anion gap: 10 (ref 5–15)
BUN: 19 mg/dL (ref 8–23)
CO2: 24 mmol/L (ref 22–32)
Calcium: 9 mg/dL (ref 8.9–10.3)
Chloride: 101 mmol/L (ref 98–111)
Creatinine, Ser: 1.06 mg/dL — ABNORMAL HIGH (ref 0.44–1.00)
GFR, Estimated: 53 mL/min — ABNORMAL LOW (ref >60)
Glucose, Bld: 103 mg/dL — ABNORMAL HIGH (ref 70–99)
Potassium: 3.3 mmol/L — ABNORMAL LOW (ref 3.5–5.1)
Sodium: 135 mmol/L (ref 135–145)

## 2019-10-27 LAB — CBC
HCT: 31.5 % — ABNORMAL LOW (ref 36.0–46.0)
Hemoglobin: 10.9 g/dL — ABNORMAL LOW (ref 12.0–15.0)
MCH: 34.7 pg — ABNORMAL HIGH (ref 26.0–34.0)
MCHC: 34.6 g/dL (ref 30.0–36.0)
MCV: 100.3 fL — ABNORMAL HIGH (ref 80.0–100.0)
Platelets: 151 10*3/uL (ref 150–400)
RBC: 3.14 MIL/uL — ABNORMAL LOW (ref 3.87–5.11)
RDW: 17 % — ABNORMAL HIGH (ref 11.5–15.5)
WBC: 2.9 10*3/uL — ABNORMAL LOW (ref 4.0–10.5)
nRBC: 0 % (ref 0.0–0.2)

## 2019-10-27 LAB — MAGNESIUM: Magnesium: 1.8 mg/dL (ref 1.7–2.4)

## 2019-10-27 LAB — TROPONIN I (HIGH SENSITIVITY)
Troponin I (High Sensitivity): 9 ng/L (ref <18)
Troponin I (High Sensitivity): 9 ng/L (ref <18)

## 2019-10-27 LAB — TSH: TSH: 0.925 u[IU]/mL (ref 0.350–4.500)

## 2019-10-27 MED ORDER — IOHEXOL 350 MG/ML SOLN
75.0000 mL | Freq: Once | INTRAVENOUS | Status: AC | PRN
  Administered 2019-10-27: 75 mL via INTRAVENOUS

## 2019-10-27 MED ORDER — POTASSIUM CHLORIDE ER 10 MEQ PO TBCR: 10.0000 meq | EXTENDED_RELEASE_TABLET | Freq: Every day | ORAL | 0 refills | Status: DC

## 2019-10-27 MED ORDER — POTASSIUM CHLORIDE CRYS ER 20 MEQ PO TBCR
40.0000 meq | EXTENDED_RELEASE_TABLET | Freq: Once | ORAL | Status: AC
  Administered 2019-10-27: 40 meq via ORAL
  Filled 2019-10-27: qty 2

## 2019-10-27 NOTE — ED Triage Notes (Addendum)
Pt comes via EMS with c/o CP. Pt states CP around 10am. Pt states mid sternal CP. Pt states palpitations. Pt denies any radiation. Pt states dull pain  Pt states some dizziness. Pt denies any SOB.  Pt has 18gIV in place

## 2019-10-27 NOTE — Discharge Instructions (Addendum)
As we discussed, this could be related to your medication regimen.  I would recommend discussing with your oncologist.  For now, we have replaced her potassium.  Drink plenty of fluids.  Avoid caffeine.  Try to get plenty of sleep.  I would recommend outpatient cardiac monitoring.  This can be discussed and set up through Dr. Grayland Ormond, or with cardiology.  I provided the number for the cardiologist on-call today.

## 2019-10-27 NOTE — ED Provider Notes (Signed)
[facility] Emergency Department Provider Note  ____________________________________________   First MD Initiated Contact with Patient 10/27/19 1607     (approximate)  I have reviewed the triage vital signs and the nursing notes.   HISTORY  Chief Complaint Chest Pain    HPI Alicia [name] is a 69 y.o. female with history of hypertension, anxiety, remote history of PE, here with chest pain and palpitations.  The patient states that earlier today, she began to feel like her heart started beating very quickly.  She had a dull chest pressure with this, as well as tingling in her hands and feet.  She felt somewhat short of breath.  She states she gets symptoms like this fairly often, normally she is able to take an aspirin and Xanax and her symptoms improved.  She does admit to recent stress.  Denies any leg swelling.  No pleurisy.  No cough or shortness of breath.  No diaphoresis.  Denies known history of coronary disease.  No specific alleviating or aggravating factors.  No recent medication changes, though she is currently on an outpatient chemo regimen, and this is her "week off."  She is scheduled to see her oncologist on Thursday.  She does states she felt very bad during her week off during the last cycle last month.        Past Medical History:  Diagnosis Date  . Anxiety   . Cancer ([place]) 07/2017   left breast  . Depression   . Hypertension   . Hypothyroidism   . Personal history of chemotherapy 2019   LEFT mastectomy-chemo before  . Personal history of radiation therapy 03/2018   LEFT mastectomy  . Rapid heart rate   . Thyroid disease     Patient Active Problem List   Diagnosis Date Noted  . 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 Anxiety 08/17/2019  . Essential hypertension 08/17/2019  . Gastroesophageal reflux disease without esophagitis 08/17/2019  . Bone metastases ([place]) 08/06/2019  . Tachycardia 06/18/2019  .  Pulmonary embolism ([place]) 01/11/2018  . Sepsis ([place]) 10/05/2017  . Breast cancer, stage 2, left ([place]) 07/24/2017  . Elevated troponin 02/17/2015    Past Surgical History:  Procedure Laterality Date  . AXILLARY LYMPH NODE BIOPSY Left 07/16/2017   METASTATIC MAMMARY CARCINOMA  . BREAST BIOPSY Left 07/16/2017   [place] bx of left breast mass and left breast LN.  INVASIVE MAMMARY CARCINOMA, NO SPECIAL TYPE.   Marland Kitchen BREAST EXCISIONAL BIOPSY Right 2001   benign  . BREAST LUMPECTOMY WITH SENTINEL LYMPH NODE BIOPSY Left 12/06/2017   Procedure: BREAST LUMPECTOMY WITH SENTINEL LYMPH NODE BX;  Surgeon: Robert Bellow, MD;  Location: [facility] ORS;  Service: General;  Laterality: Left;  . COLONOSCOPY    . MASTECTOMY Left 12/23/2017  . PORTACATH PLACEMENT Right 08/07/2017   Procedure: INSERTION PORT-A-CATH;  Surgeon: Robert Bellow, MD;  Location: [facility] ORS;  Service: General;  Laterality: Right;  . SIMPLE MASTECTOMY WITH AXILLARY SENTINEL NODE BIOPSY Left 12/23/2017   T2,N2 with 6/7 nodes positive. Whole breast radiation.  Surgeon: Robert Bellow, MD;  Location: [facility] ORS;  Service: General;  Laterality: Left;  . TONSILLECTOMY      Prior to Admission medications   Medication Sig Start Date End Date Taking? Authorizing Provider  acetaminophen (TYLENOL) 500 MG tablet Take 1,000 mg by mouth every 6 (six) hours as needed for moderate pain or headache.     [provider]  albuterol (PROVENTIL  HFA;VENTOLIN HFA) 108 (90 Base) MCG/ACT inhaler Inhale 2 puffs into the lungs every 6 (six) hours as needed for wheezing or shortness of breath. 08/09/17   Jacquelin Hawking, NP  ALPRAZolam Duanne Moron) 0.25 MG tablet Take 1 tablet (0.25 mg total) by mouth 2 (two) times daily. 06/18/19   [name], MD  ALPRAZolam Duanne Moron) 0.5 MG tablet Take 1 tablet (0.5 mg total) by mouth 2 (two) times daily. 08/17/19   [name], MD  amLODipine (NORVASC) 5 MG tablet TAKE 1 TABLET BY MOUTH DAILY 06/22/19   [name], MD    aspirin EC 81 MG tablet Take 81 mg by mouth at bedtime.     [provider]  buPROPion (WELLBUTRIN SR) 150 MG 12 hr tablet Take 150 mg by mouth 2 (two) times daily.    [provider]  calcium carbonate (OS-CAL - DOSED IN MG OF ELEMENTAL CALCIUM) 1250 (500 Ca) MG tablet Take 1 tablet by mouth daily with breakfast.    [provider]  calcium-vitamin D (OSCAL WITH D) 250-125 MG-UNIT tablet Take 1 tablet by mouth 2 (two) times daily.    [provider]  Cholecalciferol (VITAMIN D3) 125 MCG (5000 UT) CAPS Take 1 capsule by mouth 2 (two) times a day.    [provider]  docusate sodium (COLACE) 100 MG capsule Take 100 mg by mouth 3 (three) times daily as needed for mild constipation.    [provider]  levothyroxine (SYNTHROID) 100 MCG tablet TAKE 1 TABLET BY MOUTH DAILY 09/22/19   [name], MD  lisinopril (ZESTRIL) 20 MG tablet Take 20 mg by mouth daily. 04/03/19   [provider]  losartan-hydrochlorothiazide (HYZAAR) 100-12.5 MG tablet TAKE 1 TABLET BY MOUTH DAILY 10/05/19   [name], MD  Magnesium Carbonate Heavy POWD by Does not apply route. Natural Vitality Calm magnesium supplement    [provider]  ondansetron (ZOFRAN) 8 MG tablet Take 1 tablet (8 mg total) by mouth every 8 (eight) hours as needed for nausea or vomiting. 07/06/19   Darl Pikes, RPH-CPP  palbociclib (IBRANCE) 100 MG tablet Take 1 tablet (100 mg total) by mouth daily. Take for 21 days on, 7 days off, repeat every 28 days. 10/01/19   Lloyd Huger, MD  potassium chloride (KLOR-CON) 10 MEQ tablet Take 1 tablet (10 mEq total) by mouth daily for 2 days. 10/27/19 10/29/19  Duffy Bruce, MD  rosuvastatin (CRESTOR) 5 MG tablet TAKE 1 TABLET BY MOUTH DAILY 05/29/19   [name], MD  traZODone (DESYREL) 50 MG tablet TAKE 1 TABLET BY MOUTH DAILY 06/22/19   [name], MD    Allergies Sulfa antibiotics  Family History  Problem Relation Age  of Onset  . Stroke Mother   . Thyroid disease Mother   . Renal Disease Mother   . Stroke Father   . Heart attack Father   . Sudden death Father 81       suicide  . Anuerysm Brother   . Breast cancer Neg Hx     Social History Social History   Tobacco Use  . Smoking status: Current Some Day Smoker    Packs/day: 0.25    Years: 30.00    Pack years: 7.50    Types: Cigarettes  . Smokeless tobacco: Never Used  Vaping Use  . Vaping Use: Never used  Substance Use Topics  . Alcohol use: Yes    Comment: occasional q 69mo   . Drug use: Never  Review of Systems  Review of Systems  Constitutional: Positive for fatigue. Negative for fever.  HENT: Negative for congestion and sore throat.   Eyes: Negative for visual disturbance.  Respiratory: Negative for cough and shortness of breath.   Cardiovascular: Positive for chest pain and palpitations.  Gastrointestinal: Negative for abdominal pain, diarrhea, nausea and vomiting.  Genitourinary: Negative for flank pain.  Musculoskeletal: Negative for back pain and neck pain.  Skin: Negative for rash and wound.  Neurological: Negative for weakness.  Psychiatric/Behavioral: The patient is nervous/anxious.      ____________________________________________  PHYSICAL EXAM:      VITAL SIGNS: ED Triage Vitals  Enc Vitals Group     BP 10/27/19 1230 (!) 144/68     Pulse Rate 10/27/19 1230 73     Resp 10/27/19 1230 16     Temp 10/27/19 1230 98.2 F (36.8 C)     Temp Source 10/27/19 1230 Oral     SpO2 10/27/19 1230 99 %     Weight 10/27/19 1210 220 lb (99.8 kg)     Height 10/27/19 1210 5\' 5"  (1.651 m)     Head Circumference --      Peak Flow --      Pain Score 10/27/19 1210 5     Pain Loc --      Pain Edu? --      Excl. in [place]? --      Physical Exam Vitals and nursing note reviewed.  Constitutional:      General: She is not in acute distress.    Appearance: She is well-developed.  HENT:     Head: Normocephalic and atraumatic.    Eyes:     Conjunctiva/sclera: Conjunctivae normal.  Cardiovascular:     Rate and Rhythm: Normal rate and regular rhythm.     Heart sounds: Normal heart sounds. No murmur heard.  No friction rub.  Pulmonary:     Effort: Pulmonary effort is normal. No respiratory distress.     Breath sounds: Normal breath sounds. No wheezing or rales.  Abdominal:     General: There is no distension.     Palpations: Abdomen is soft.     Tenderness: There is no abdominal tenderness.  Musculoskeletal:     Cervical back: Neck supple.  Skin:    General: Skin is warm.     Capillary Refill: Capillary refill takes less than 2 seconds.  Neurological:     Mental Status: She is alert and oriented to person, place, and time.     Motor: No abnormal muscle tone.       ____________________________________________   LABS (all labs ordered are listed, but only abnormal results are displayed)  Labs Reviewed  BASIC METABOLIC PANEL - Abnormal; Notable for the following components:      Result Value   Potassium 3.3 (*)    Glucose, Bld 103 (*)    Creatinine, Ser 1.06 (*)    GFR, Estimated 53 (*)    All other components within normal limits  CBC - Abnormal; Notable for the following components:   WBC 2.9 (*)    RBC 3.14 (*)    Hemoglobin 10.9 (*)    HCT 31.5 (*)    MCV 100.3 (*)    MCH 34.7 (*)    RDW 17.0 (*)    All other components within normal limits  MAGNESIUM  TSH  TROPONIN I (HIGH SENSITIVITY)  TROPONIN I (HIGH SENSITIVITY)    ____________________________________________  EKG: Normal sinus rhythm, ventricular rate  65.  PR 226, QRS 82, QTc 451.  No acute ST elevation or depressions.  No ischemia or infarct. ________________________________________  RADIOLOGY All imaging, including plain films, CT scans, and ultrasounds, independently reviewed by me, and interpretations confirmed via formal radiology reads.  ED MD interpretation:   Chest x-ray: No acute normality CT angio: No acute  abnormality, no PE, postradiation changes  Official radiology report(s): DG Chest 2 View  Result Date: 10/27/2019 CLINICAL DATA:  Chest pain EXAM: CHEST - 2 VIEW COMPARISON:  01/10/2018 FINDINGS: The right sided Port-A-Cath has been removed in the interim from the prior exam. The heart size and mediastinal contours are within normal limits. Stable calcified granuloma at the right lung base. No focal airspace consolidation, pleural effusion, or pneumothorax. Old posterior right sixth rib fracture. No acute osseous findings. IMPRESSION: No active cardiopulmonary disease. Electronically Signed   By: Davina Poke D.O.   On: 10/27/2019 12:49   CT Angio Chest PE W and/or Wo Contrast  Result Date: 10/27/2019 CLINICAL DATA:  69 year old female with chest pain onset at 1000 hours. Palpitations. Dizziness. EXAM: CT ANGIOGRAPHY CHEST WITH CONTRAST TECHNIQUE: Multidetector CT imaging of the chest was performed using the standard protocol during bolus administration of intravenous contrast. Multiplanar CT image reconstructions and MIPs were obtained to evaluate the vascular anatomy. CONTRAST:  20mL OMNIPAQUE IOHEXOL 350 MG/ML SOLN COMPARISON:  Chest radiographs today.  Chest CTA 01/10/2018. FINDINGS: Cardiovascular: Good contrast bolus timing in the pulmonary arterial tree. No focal filling defect identified in the pulmonary arteries to suggest acute pulmonary embolism. Mild Calcified aortic atherosclerosis. No calcified coronary artery atherosclerosis is evident. No cardiomegaly or pericardial effusion. Right chest Port-A-Cath has been removed since 2019. Mediastinum/Nodes: No mediastinal lymphadenopathy. Possible small gastric hiatal hernia. Lungs/Pleura: Major airways are patent. Chronic upper lobe centrilobular emphysema. Anterior left upper lobe subpleural scarring has developed since 2019, likely post radiation related in the setting of previous left mastectomy. Mild dependent atelectasis today. Chronic  calcified granuloma and mild regional scarring in the right lower lobe is stable. Upper Abdomen: Negative visible upper abdominal viscera. Musculoskeletal: Previous left mastectomy. No acute or suspicious osseous lesion identified. Review of the MIP images confirms the above findings. IMPRESSION: 1. Negative for acute pulmonary embolus. 2. Emphysema (ICD10-J43.9). New left upper lobe scarring since 2019, probably post radiation related. Stable post granulomatous scarring in the right lower lobe. Electronically Signed   By: Genevie Ann M.D.   On: 10/27/2019 17:29    ____________________________________________  PROCEDURES   Procedure(s) performed (including Critical Care):  Procedures  ____________________________________________  INITIAL IMPRESSION / MDM / [place] / ED COURSE  As part of my medical decision making, I reviewed the following data within the [place] notes reviewed and incorporated, Old chart reviewed, Notes from prior ED visits, and [place] Controlled Substance Database       *Alicia [name] was evaluated in Emergency Department on 10/27/2019 for the symptoms described in the history of present illness. She was evaluated in the context of the global COVID-19 pandemic, which necessitated consideration that the patient might be at risk for infection with the SARS-CoV-2 virus that causes COVID-19. Institutional protocols and algorithms that pertain to the evaluation of patients at risk for COVID-19 are in a state of rapid change based on information released by regulatory bodies including the CDC and federal and state organizations. These policies and algorithms were followed during the patient's care in the ED.  Some ED evaluations and interventions may  be delayed as a result of limited staffing during the pandemic.*     Medical Decision Making: 69 year old female here with atypical chest pain and palpitations.  Suspect this could be related to  anxiety, versus adverse effect of her chemo regimen given that she had somewhat similar symptoms during her off week last month.  Differential also includes transient palpitations, and arrhythmia is a side effect of her current regimen.  She has had no ectopy or arrhythmia on telemetry here and EKG is within normal limits.  Lab work does show mild hypokalemia, but negative troponins x2 and no evidence of ongoing ischemia or infarct.  No significant electrolyte derangements. Her symptoms are improved here after Xanax.  She does have anemia, pancytopenia consistent with her chemo regimen and the does not appear significantly changed from baseline.  Given her well appearance and absence of arrhythmia or abnormal labs, feel she can be safely managed as an outpatient.  I will have her call her oncologist and referred to cardiology as well for possible outpatient Holter monitoring.  ____________________________________________  FINAL CLINICAL IMPRESSION(S) / ED DIAGNOSES  Final diagnoses:  Atypical chest pain  Palpitations     MEDICATIONS GIVEN DURING THIS VISIT:  Medications  potassium chloride SA (KLOR-CON) CR tablet 40 mEq (40 mEq Oral Given 10/27/19 1716)  iohexol (OMNIPAQUE) 350 MG/ML injection 75 mL (75 mLs Intravenous Contrast Given 10/27/19 1656)     ED Discharge Orders         Ordered    potassium chloride (KLOR-CON) 10 MEQ tablet  Daily        10/27/19 1913           Note:  This document was prepared using Dragon voice recognition software and may include unintentional dictation errors.   Duffy Bruce, MD 10/27/19 1921

## 2019-10-29 ENCOUNTER — Inpatient Hospital Stay: Payer: Medicare Other | Attending: Oncology

## 2019-10-29 ENCOUNTER — Inpatient Hospital Stay: Payer: Medicare Other

## 2019-10-29 ENCOUNTER — Other Ambulatory Visit: Payer: Self-pay

## 2019-10-29 ENCOUNTER — Inpatient Hospital Stay: Payer: Medicare Other | Admitting: Pharmacist

## 2019-10-29 ENCOUNTER — Encounter: Payer: Self-pay | Admitting: Oncology

## 2019-10-29 ENCOUNTER — Inpatient Hospital Stay (HOSPITAL_BASED_OUTPATIENT_CLINIC_OR_DEPARTMENT_OTHER): Payer: Medicare Other | Admitting: Oncology

## 2019-10-29 VITALS — BP 110/60 | HR 83 | Temp 97.2°F | Resp 18 | Wt 222.0 lb

## 2019-10-29 DIAGNOSIS — C50912 Malignant neoplasm of unspecified site of left female breast: Secondary | ICD-10-CM

## 2019-10-29 DIAGNOSIS — Z5111 Encounter for antineoplastic chemotherapy: Secondary | ICD-10-CM | POA: Diagnosis not present

## 2019-10-29 DIAGNOSIS — C7951 Secondary malignant neoplasm of bone: Secondary | ICD-10-CM | POA: Diagnosis not present

## 2019-10-29 DIAGNOSIS — C50919 Malignant neoplasm of unspecified site of unspecified female breast: Secondary | ICD-10-CM

## 2019-10-29 LAB — CBC WITH DIFFERENTIAL/PLATELET
Abs Immature Granulocytes: 0.03 10*3/uL (ref 0.00–0.07)
Basophils Absolute: 0 10*3/uL (ref 0.0–0.1)
Basophils Relative: 1 %
Eosinophils Absolute: 0.1 10*3/uL (ref 0.0–0.5)
Eosinophils Relative: 2 %
HCT: 31.9 % — ABNORMAL LOW (ref 36.0–46.0)
Hemoglobin: 11 g/dL — ABNORMAL LOW (ref 12.0–15.0)
Immature Granulocytes: 1 %
Lymphocytes Relative: 17 %
Lymphs Abs: 0.5 10*3/uL — ABNORMAL LOW (ref 0.7–4.0)
MCH: 35 pg — ABNORMAL HIGH (ref 26.0–34.0)
MCHC: 34.5 g/dL (ref 30.0–36.0)
MCV: 101.6 fL — ABNORMAL HIGH (ref 80.0–100.0)
Monocytes Absolute: 0.5 10*3/uL (ref 0.1–1.0)
Monocytes Relative: 17 %
Neutro Abs: 1.9 10*3/uL (ref 1.7–7.7)
Neutrophils Relative %: 62 %
Platelets: 170 10*3/uL (ref 150–400)
RBC: 3.14 MIL/uL — ABNORMAL LOW (ref 3.87–5.11)
RDW: 17.1 % — ABNORMAL HIGH (ref 11.5–15.5)
WBC: 3.1 10*3/uL — ABNORMAL LOW (ref 4.0–10.5)
nRBC: 0 % (ref 0.0–0.2)

## 2019-10-29 LAB — COMPREHENSIVE METABOLIC PANEL
ALT: 19 U/L (ref 0–44)
AST: 22 U/L (ref 15–41)
Albumin: 4.2 g/dL (ref 3.5–5.0)
Alkaline Phosphatase: 65 U/L (ref 38–126)
Anion gap: 8 (ref 5–15)
BUN: 19 mg/dL (ref 8–23)
CO2: 25 mmol/L (ref 22–32)
Calcium: 8.9 mg/dL (ref 8.9–10.3)
Chloride: 104 mmol/L (ref 98–111)
Creatinine, Ser: 1.24 mg/dL — ABNORMAL HIGH (ref 0.44–1.00)
GFR, Estimated: 44 mL/min — ABNORMAL LOW (ref >60)
Glucose, Bld: 92 mg/dL (ref 70–99)
Potassium: 4.2 mmol/L (ref 3.5–5.1)
Sodium: 137 mmol/L (ref 135–145)
Total Bilirubin: 0.8 mg/dL (ref 0.3–1.2)
Total Protein: 7.3 g/dL (ref 6.5–8.1)

## 2019-10-29 MED ORDER — FULVESTRANT 250 MG/5ML IM SOLN
500.0000 mg | Freq: Once | INTRAMUSCULAR | Status: AC
  Administered 2019-10-29: 500 mg via INTRAMUSCULAR
  Filled 2019-10-29: qty 10

## 2019-10-29 MED ORDER — FULVESTRANT 250 MG/5ML IM SOLN: 500.0000 mg | Freq: Once | INTRAMUSCULAR | Status: DC

## 2019-10-29 MED ORDER — DENOSUMAB 120 MG/1.7ML ~~LOC~~ SOLN
120.0000 mg | Freq: Once | SUBCUTANEOUS | Status: AC
  Administered 2019-10-29: 120 mg via SUBCUTANEOUS
  Filled 2019-10-29: qty 1.7

## 2019-10-29 MED ORDER — PALBOCICLIB 125 MG PO TABS: 125.0000 mg | ORAL_TABLET | Freq: Every day | ORAL | 3 refills | Status: DC

## 2019-10-29 MED ORDER — DENOSUMAB 120 MG/1.7ML ~~LOC~~ SOLN: 120.0000 mg | Freq: Once | SUBCUTANEOUS | Status: DC

## 2019-10-29 NOTE — Telephone Encounter (Signed)
Research RN called patient to ascertain with changes in her diagnosis recently if she is still interested in participating in the [id number] study. The patient states she wants to withdraw from the protocol because she has new bone metastasis and is unable to exercise or lose weight currently. Research RN provided education for the patient that her participation is voluntary and the study doesn't dictate that she withdraw, that decision is up to her. She states she understands but that there's too much happening right now and she feels it would be for the best to withdraw her consent for protocol participation.  Jeral Fruit, RN, BSN, OCN Date: 09/03/2019 Time: 1000

## 2019-10-29 NOTE — Progress Notes (Signed)
Patient here today for follow up and treatment consideration regarding breast cancer. Patient was in the ER on Tuesday for heart palpitations and chest pain, has been referred to cardiology.

## 2019-10-29 NOTE — Progress Notes (Signed)
[place]  [phone] [phone]  Patient Care Team: [name], MD as PCP - General (Internal Medicine) Rico Junker, RN as Registered Nurse Lloyd Huger, MD as Consulting Physician (Oncology) Bary Castilla Forest Gleason, MD as Consulting Physician (General Surgery) Bary Castilla Forest Gleason, MD as Consulting Physician (General Surgery) Noreene Filbert, MD as Referring Physician (Radiation Oncology) Jeral Fruit, RN as Registered Nurse   Name of the patient: Alicia [name]  [id number]  May 12, 1950   Date of visit: 10/29/19  HPI: Patient is a 69 y.o. female with recurrent stage IV ER/PR positive, HER2 negative breast cancer. Patient started on Ibrance on 07/09/19.  Reason for Consult: Oral chemotherapy follow-up for Ibrance (palbociclib) therapy, s/p cycle four.   PAST MEDICAL HISTORY: Past Medical History:  Diagnosis Date  . Anxiety   . Cancer ([place]) 07/2017   left breast  . Depression   . Hypertension   . Hypothyroidism   . Personal history of chemotherapy 2019   LEFT mastectomy-chemo before  . Personal history of radiation therapy 03/2018   LEFT mastectomy  . Rapid heart rate   . Thyroid disease     PAST SURGICAL HISTORY:  Past Surgical History:  Procedure Laterality Date  . AXILLARY LYMPH NODE BIOPSY Left 07/16/2017   METASTATIC MAMMARY CARCINOMA  . BREAST BIOPSY Left 07/16/2017   [place] bx of left breast mass and left breast LN.  INVASIVE MAMMARY CARCINOMA, NO SPECIAL TYPE.   Marland Kitchen BREAST EXCISIONAL BIOPSY Right 2001   benign  . BREAST LUMPECTOMY WITH SENTINEL LYMPH NODE BIOPSY Left 12/06/2017   Procedure: BREAST LUMPECTOMY WITH SENTINEL LYMPH NODE BX;  Surgeon: Robert Bellow, MD;  Location: [facility] ORS;  Service: General;  Laterality: Left;  . COLONOSCOPY    . MASTECTOMY Left 12/23/2017  . PORTACATH PLACEMENT Right 08/07/2017   Procedure: INSERTION PORT-A-CATH;  Surgeon: Robert Bellow,  MD;  Location: [facility] ORS;  Service: General;  Laterality: Right;  . SIMPLE MASTECTOMY WITH AXILLARY SENTINEL NODE BIOPSY Left 12/23/2017   T2,N2 with 6/7 nodes positive. Whole breast radiation.  Surgeon: Robert Bellow, MD;  Location: [facility] ORS;  Service: General;  Laterality: Left;  . TONSILLECTOMY      HEMATOLOGY/ONCOLOGY HISTORY:  Oncology History Overview Note  Patient is a 69 year old female who recently self palpated a mass on her left breast.  Subsequent imaging and biopsy revealed the above-stated breast cancer.  Case was also discussed extensively at case conference.  Given the size and the stage of patient's malignancy, she will benefit from neoadjuvant chemotherapy using Adriamycin, Cytoxan, and Taxol.  Patient will also require Neulasta support.  Will get CT scan of the chest, abdomen, and pelvis to assess for any metastatic disease.  Patient will also require port placement and MUGA prior to initiating treatment  CT abdomen/pelvis/chest did not reveal any suspicious lesions concerning for metastatic disease. (08/01/17)  Port-A-Cath placed on 08/07/2017.  Cycle 1 day 1 of AC was given on 08/08/17.     Breast cancer, stage 2, left ([place])  07/24/2017 Initial Diagnosis   Breast cancer, stage 2, left ([place])   07/30/2017 Cancer Staging   Staging form: Breast, AJCC 8th Edition - Clinical stage from 07/30/2017: Stage IIA (cT2, cN1, cM0, G2, ER+, PR+, HER2-) - Signed by Lloyd Huger, MD on 07/30/2017   08/08/2017 - 10/31/2017 Chemotherapy   The patient had DOXOrubicin (ADRIAMYCIN) chemo injection 130 mg, 60 mg/m2 = 130 mg, Intravenous,  Once, 4 of 4 cycles  Administration: 130 mg (08/08/2017), 130 mg (08/22/2017), 130 mg (09/05/2017), 130 mg (09/19/2017) palonosetron (ALOXI) injection 0.25 mg, 0.25 mg, Intravenous,  Once, 4 of 4 cycles Administration: 0.25 mg (08/08/2017), 0.25 mg (08/22/2017), 0.25 mg (09/05/2017), 0.25 mg (09/19/2017) pegfilgrastim-cbqv (UDENYCA) injection 6 mg, 6 mg,  Subcutaneous, Once, 4 of 4 cycles Administration: 6 mg (08/09/2017), 6 mg (08/23/2017), 6 mg (09/06/2017), 6 mg (09/20/2017) cyclophosphamide (CYTOXAN) 1,300 mg in sodium chloride 0.9 % 250 mL chemo infusion, 600 mg/m2 = 1,300 mg, Intravenous,  Once, 4 of 4 cycles Administration: 1,300 mg (08/08/2017), 1,300 mg (08/22/2017), 1,300 mg (09/05/2017), 1,300 mg (09/19/2017) PACLitaxel (TAXOL) 174 mg in sodium chloride 0.9 % 250 mL chemo infusion (</= 63m/m2), 80 mg/m2 = 174 mg, Intravenous,  Once, 4 of 12 cycles Dose modification: 72 mg/m2 (original dose 80 mg/m2, Cycle 6, Reason: Dose not tolerated) Administration: 174 mg (10/03/2017), 156 mg (10/17/2017), 156 mg (10/24/2017), 156 mg (10/31/2017) fosaprepitant (EMEND) 150 mg, dexamethasone (DECADRON) 12 mg in sodium chloride 0.9 % 145 mL IVPB, , Intravenous,  Once, 4 of 4 cycles Administration:  (08/08/2017),  (08/22/2017),  (09/05/2017),  (09/19/2017)  for chemotherapy treatment.      ALLERGIES:  is allergic to sulfa antibiotics.  MEDICATIONS:  Current Outpatient Medications  Medication Sig Dispense Refill  . acetaminophen (TYLENOL) 500 MG tablet Take 1,000 mg by mouth every 6 (six) hours as needed for moderate pain or headache.     . albuterol (PROVENTIL HFA;VENTOLIN HFA) 108 (90 Base) MCG/ACT inhaler Inhale 2 puffs into the lungs every 6 (six) hours as needed for wheezing or shortness of breath. 1 Inhaler 2  . ALPRAZolam (XANAX) 0.5 MG tablet Take 1 tablet (0.5 mg total) by mouth 2 (two) times daily. 60 tablet 0  . amLODipine (NORVASC) 5 MG tablet TAKE 1 TABLET BY MOUTH DAILY 90 tablet 3  . aspirin EC 81 MG tablet Take 81 mg by mouth at bedtime.     .Marland KitchenbuPROPion (WELLBUTRIN SR) 150 MG 12 hr tablet Take 150 mg by mouth 2 (two) times daily.    . calcium carbonate (OS-CAL - DOSED IN MG OF ELEMENTAL CALCIUM) 1250 (500 Ca) MG tablet Take 1 tablet by mouth daily with breakfast.    . calcium-vitamin D (OSCAL WITH D) 250-125 MG-UNIT tablet Take 1 tablet by mouth 2 (two)  times daily.    . Cholecalciferol (VITAMIN D3) 125 MCG (5000 UT) CAPS Take 1 capsule by mouth 2 (two) times a day.    . docusate sodium (COLACE) 100 MG capsule Take 100 mg by mouth 3 (three) times daily as needed for mild constipation.    .Marland Kitchenlevothyroxine (SYNTHROID) 100 MCG tablet TAKE 1 TABLET BY MOUTH DAILY 90 tablet 3  . lisinopril (ZESTRIL) 20 MG tablet Take 20 mg by mouth daily.    .Marland Kitchenlosartan-hydrochlorothiazide (HYZAAR) 100-12.5 MG tablet TAKE 1 TABLET BY MOUTH DAILY 30 tablet 6  . Magnesium Carbonate Heavy POWD by Does not apply route. Natural Vitality Calm magnesium supplement    . ondansetron (ZOFRAN) 8 MG tablet Take 1 tablet (8 mg total) by mouth every 8 (eight) hours as needed for nausea or vomiting. 20 tablet 0  . palbociclib (IBRANCE) 100 MG tablet Take 1 tablet (100 mg total) by mouth daily. Take for 21 days on, 7 days off, repeat every 28 days. 21 tablet 2  . potassium chloride (KLOR-CON) 10 MEQ tablet Take 1 tablet (10 mEq total) by mouth daily for 2 days. 2 tablet 0  . rosuvastatin (CRESTOR) 5  MG tablet TAKE 1 TABLET BY MOUTH DAILY 30 tablet 6  . traZODone (DESYREL) 50 MG tablet TAKE 1 TABLET BY MOUTH DAILY 30 tablet 6   No current facility-administered medications for this visit.   Facility-Administered Medications Ordered in Other Visits  Medication Dose Route Frequency Provider Last Rate Last Admin  . denosumab (XGEVA) injection 120 mg  120 mg Subcutaneous Once Lloyd Huger, MD      . fulvestrant (FASLODEX) injection 500 mg  500 mg Intramuscular Once Lloyd Huger, MD        VITAL SIGNS: There were no vitals taken for this visit. There were no vitals filed for this visit.  Estimated body mass index is 36.94 kg/m as calculated from the following:   Height as of 10/27/19: 5' 5" (1.651 m).   Weight as of an earlier encounter on 10/29/19: 100.7 kg (222 lb).  LABS: CBC:    Component Value Date/Time   WBC 3.1 (L) 10/29/2019 1338   HGB 11.0 (L) 10/29/2019  1338   HGB 14.2 02/11/2011 1349   HCT 31.9 (L) 10/29/2019 1338   HCT 40.9 02/11/2011 1349   PLT 170 10/29/2019 1338   PLT 151 02/11/2011 1349   MCV 101.6 (H) 10/29/2019 1338   MCV 89 02/11/2011 1349   NEUTROABS 1.9 10/29/2019 1338   LYMPHSABS 0.5 (L) 10/29/2019 1338   MONOABS 0.5 10/29/2019 1338   EOSABS 0.1 10/29/2019 1338   BASOSABS 0.0 10/29/2019 1338   Comprehensive Metabolic Panel:    Component Value Date/Time   NA 137 10/29/2019 1338   NA 142 02/11/2011 1349   K 4.2 10/29/2019 1338   K 3.8 02/11/2011 1349   CL 104 10/29/2019 1338   CL 107 02/11/2011 1349   CO2 25 10/29/2019 1338   CO2 24 02/11/2011 1349   BUN 19 10/29/2019 1338   BUN 14 02/11/2011 1349   CREATININE 1.24 (H) 10/29/2019 1338   CREATININE 0.65 02/11/2011 1349   GLUCOSE 92 10/29/2019 1338   GLUCOSE 98 02/11/2011 1349   CALCIUM 8.9 10/29/2019 1338   CALCIUM 9.0 02/11/2011 1349   AST 22 10/29/2019 1338   AST 27 02/11/2011 1349   ALT 19 10/29/2019 1338   ALT 36 02/11/2011 1349   ALKPHOS 65 10/29/2019 1338   ALKPHOS 122 02/11/2011 1349   BILITOT 0.8 10/29/2019 1338   BILITOT 0.5 02/11/2011 1349   PROT 7.3 10/29/2019 1338   PROT 7.0 02/11/2011 1349   ALBUMIN 4.2 10/29/2019 1338   ALBUMIN 4.1 02/11/2011 1349    RADIOGRAPHIC STUDIES: DG Chest 2 View  Result Date: 10/27/2019 CLINICAL DATA:  Chest pain EXAM: CHEST - 2 VIEW COMPARISON:  01/10/2018 FINDINGS: The right sided Port-A-Cath has been removed in the interim from the prior exam. The heart size and mediastinal contours are within normal limits. Stable calcified granuloma at the right lung base. No focal airspace consolidation, pleural effusion, or pneumothorax. Old posterior right sixth rib fracture. No acute osseous findings. IMPRESSION: No active cardiopulmonary disease. Electronically Signed   By: Davina Poke D.O.   On: 10/27/2019 12:49   CT Angio Chest PE W and/or Wo Contrast  Result Date: 10/27/2019 CLINICAL DATA:  69 year old female  with chest pain onset at 1000 hours. Palpitations. Dizziness. EXAM: CT ANGIOGRAPHY CHEST WITH CONTRAST TECHNIQUE: Multidetector CT imaging of the chest was performed using the standard protocol during bolus administration of intravenous contrast. Multiplanar CT image reconstructions and MIPs were obtained to evaluate the vascular anatomy. CONTRAST:  99m OMNIPAQUE IOHEXOL 350  MG/ML SOLN COMPARISON:  Chest radiographs today.  Chest CTA 01/10/2018. FINDINGS: Cardiovascular: Good contrast bolus timing in the pulmonary arterial tree. No focal filling defect identified in the pulmonary arteries to suggest acute pulmonary embolism. Mild Calcified aortic atherosclerosis. No calcified coronary artery atherosclerosis is evident. No cardiomegaly or pericardial effusion. Right chest Port-A-Cath has been removed since 2019. Mediastinum/Nodes: No mediastinal lymphadenopathy. Possible small gastric hiatal hernia. Lungs/Pleura: Major airways are patent. Chronic upper lobe centrilobular emphysema. Anterior left upper lobe subpleural scarring has developed since 2019, likely post radiation related in the setting of previous left mastectomy. Mild dependent atelectasis today. Chronic calcified granuloma and mild regional scarring in the right lower lobe is stable. Upper Abdomen: Negative visible upper abdominal viscera. Musculoskeletal: Previous left mastectomy. No acute or suspicious osseous lesion identified. Review of the MIP images confirms the above findings. IMPRESSION: 1. Negative for acute pulmonary embolus. 2. Emphysema (ICD10-J43.9). New left upper lobe scarring since 2019, probably post radiation related. Stable post granulomatous scarring in the right lower lobe. Electronically Signed   By: Genevie Ann M.D.   On: 10/27/2019 17:29     Assessment and Plan-  Reviewed CBC plan to continue Ibrance at 131m dosing, previously discussed decreasing to 1077mfor cycle five due to reported fatigue but patient received 12528mrom  pharmacy. Labs adequate to remain on 125m50mse and patient feeling less fatigued this month. Patient starts cycle 5 tomorrow.    Oral Chemotherapy Side Effect/Intolerance:  - Denies diarrhea with past cycle, manages constipation with Miralax - Denies nausea, has Zofran on hand if needed - Reports some fatigue, denies impact on ADLs, improves after resting - Denies skin rash  Oral Chemotherapy Adherence: No reported missed doses  Medication Access Issues: Ms. [facility] and had her Ibrance delivered, she has medication on hand to start cycle 5. Prescription for Ibrance 125 mg sent to [facility]. Patient knows she is enrolled in patient assistance until end of year. We will help to ensure Ibrance access in 2022.  Other: Patient started new [place]-Con two days ago, no DDI with Ibrance.  Patient expressed understanding and was in agreement with this plan. She also understands that She can call clinic at any time with any questions, concerns, or complaints.   Thank you for allowing me to participate in the care of this very pleasant patient.   Time Total: 10 mins  Visit consisted of counseling and education on dealing with issues of symptom management in the setting of serious and potentially life-threatening illness.Greater than 50%  of this time was spent counseling and coordinating care related to the above assessment and plan.  Signed by: AlysDarl PikesarmD, BCPS, BCOPSalley SlaughterP Hematology/Oncology Clinical Pharmacist Practitioner [facility]-[phone]/14/2021 2:57 PM

## 2019-10-30 LAB — CANCER ANTIGEN 27.29: CA 27.29: 69.6 U/mL — ABNORMAL HIGH (ref 0.0–38.6)

## 2019-11-03 ENCOUNTER — Ambulatory Visit (INDEPENDENT_AMBULATORY_CARE_PROVIDER_SITE_OTHER): Payer: Medicare Other | Admitting: Internal Medicine

## 2019-11-03 ENCOUNTER — Other Ambulatory Visit: Payer: Self-pay

## 2019-11-03 ENCOUNTER — Encounter: Payer: Self-pay | Admitting: Internal Medicine

## 2019-11-03 VITALS — BP 125/72 | HR 80 | Ht 65.0 in | Wt 224.0 lb

## 2019-11-03 DIAGNOSIS — C50912 Malignant neoplasm of unspecified site of left female breast: Secondary | ICD-10-CM | POA: Diagnosis not present

## 2019-11-03 DIAGNOSIS — F419 Anxiety disorder, unspecified: Secondary | ICD-10-CM | POA: Diagnosis not present

## 2019-11-03 DIAGNOSIS — C7951 Secondary malignant neoplasm of bone: Secondary | ICD-10-CM | POA: Diagnosis not present

## 2019-11-03 DIAGNOSIS — I10 Essential (primary) hypertension: Secondary | ICD-10-CM

## 2019-11-03 DIAGNOSIS — E669 Obesity, unspecified: Secondary | ICD-10-CM | POA: Diagnosis not present

## 2019-11-03 MED ORDER — ALPRAZOLAM 0.5 MG PO TABS: 0.5000 mg | ORAL_TABLET | Freq: Two times a day (BID) | ORAL | 0 refills | Status: DC

## 2019-11-08 ENCOUNTER — Encounter: Payer: Self-pay | Admitting: Internal Medicine

## 2019-11-08 DIAGNOSIS — E669 Obesity, unspecified: Secondary | ICD-10-CM | POA: Insufficient documentation

## 2019-11-08 NOTE — Assessment & Plan Note (Signed)
Blood pressure is under control on present medication.  She has been advised to lose weight slowly.

## 2019-11-08 NOTE — Progress Notes (Signed)
Established Patient Office Visit  Subjective:  Patient ID: Alicia [name], female    DOB: April 17, 1950  Age: 69 y.o. MRN: [id number]  CC:  Chief Complaint  Patient presents with  . Follow-up    HPI  Alicia [name] [facility] presents for follow-up evaluation of the chest pain.  She is known to have cancer of the left breast and has a chemotherapy and radiation therapy being followed up by oncologist, she has a recent chest pain for which she was seen in the emergency room and a CT angiogram was performed which was unremarkable.  Troponin was okay.  Chest x-ray was all right.,  She is known to have hypertension anxiety and past history of pulmonary embolism.  Today she denies any cough or shortness of breath.  Past Medical History:  Diagnosis Date  . Anxiety   . Cancer ([place]) 07/2017   left breast  . Depression   . Hypertension   . Hypothyroidism   . Personal history of chemotherapy 2019   LEFT mastectomy-chemo before  . Personal history of radiation therapy 03/2018   LEFT mastectomy  . Rapid heart rate   . Thyroid disease     Past Surgical History:  Procedure Laterality Date  . AXILLARY LYMPH NODE BIOPSY Left 07/16/2017   METASTATIC MAMMARY CARCINOMA  . BREAST BIOPSY Left 07/16/2017   [place] bx of left breast mass and left breast LN.  INVASIVE MAMMARY CARCINOMA, NO SPECIAL TYPE.   Marland Kitchen BREAST EXCISIONAL BIOPSY Right 2001   benign  . BREAST LUMPECTOMY WITH SENTINEL LYMPH NODE BIOPSY Left 12/06/2017   Procedure: BREAST LUMPECTOMY WITH SENTINEL LYMPH NODE BX;  Surgeon: Robert Bellow, MD;  Location: [facility] ORS;  Service: General;  Laterality: Left;  . COLONOSCOPY    . MASTECTOMY Left 12/23/2017  . PORTACATH PLACEMENT Right 08/07/2017   Procedure: INSERTION PORT-A-CATH;  Surgeon: Robert Bellow, MD;  Location: [facility] ORS;  Service: General;  Laterality: Right;  . SIMPLE MASTECTOMY WITH AXILLARY SENTINEL NODE BIOPSY Left 12/23/2017   T2,N2 with 6/7 nodes positive. Whole breast  radiation.  Surgeon: Robert Bellow, MD;  Location: [facility] ORS;  Service: General;  Laterality: Left;  . TONSILLECTOMY      Family History  Problem Relation Age of Onset  . Stroke Mother   . Thyroid disease Mother   . Renal Disease Mother   . Stroke Father   . Heart attack Father   . Sudden death Father 50       suicide  . Anuerysm Brother   . Breast cancer Neg Hx     Social History   Socioeconomic History  . Marital status: Widowed    Spouse name: Not on file  . Number of children: Not on file  . Years of education: Not on file  . Highest education level: Not on file  Occupational History  . Not on file  Tobacco Use  . Smoking status: Current Some Day Smoker    Packs/day: 0.25    Years: 30.00    Pack years: 7.50    Types: Cigarettes  . Smokeless tobacco: Never Used  Vaping Use  . Vaping Use: Never used  Substance and Sexual Activity  . Alcohol use: Yes    Comment: occasional q 67mo   . Drug use: Never  . Sexual activity: Not Currently  Other Topics Concern  . Not on file  Social History Narrative  . Not on file   Social Determinants of Health   Financial Resource Strain:   .  Difficulty of Paying Living Expenses: Not on file  Food Insecurity:   . Worried About Charity fundraiser in the Last Year: Not on file  . Ran Out of Food in the Last Year: Not on file  Transportation Needs:   . Lack of Transportation (Medical): Not on file  . Lack of Transportation (Non-Medical): Not on file  Physical Activity:   . Days of Exercise per Week: Not on file  . Minutes of Exercise per Session: Not on file  Stress:   . Feeling of Stress : Not on file  Social Connections:   . Frequency of Communication with Friends and Family: Not on file  . Frequency of Social Gatherings with Friends and Family: Not on file  . Attends Religious Services: Not on file  . Active Member of Clubs or Organizations: Not on file  . Attends Archivist Meetings: Not on file  .  Marital Status: Not on file  Intimate Partner Violence:   . Fear of Current or Ex-Partner: Not on file  . Emotionally Abused: Not on file  . Physically Abused: Not on file  . Sexually Abused: Not on file     Current Outpatient Medications:  .  acetaminophen (TYLENOL) 500 MG tablet, Take 1,000 mg by mouth every 6 (six) hours as needed for moderate pain or headache. , Disp: , Rfl:  .  albuterol (PROVENTIL HFA;VENTOLIN HFA) 108 (90 Base) MCG/ACT inhaler, Inhale 2 puffs into the lungs every 6 (six) hours as needed for wheezing or shortness of breath., Disp: 1 Inhaler, Rfl: 2 .  ALPRAZolam (XANAX) 0.5 MG tablet, Take 1 tablet (0.5 mg total) by mouth 2 (two) times daily., Disp: 60 tablet, Rfl: 0 .  amLODipine (NORVASC) 5 MG tablet, TAKE 1 TABLET BY MOUTH DAILY, Disp: 90 tablet, Rfl: 3 .  aspirin EC 81 MG tablet, Take 81 mg by mouth at bedtime. , Disp: , Rfl:  .  buPROPion (WELLBUTRIN SR) 150 MG 12 hr tablet, Take 150 mg by mouth 2 (two) times daily., Disp: , Rfl:  .  calcium carbonate (OS-CAL - DOSED IN MG OF ELEMENTAL CALCIUM) 1250 (500 Ca) MG tablet, Take 1 tablet by mouth daily with breakfast., Disp: , Rfl:  .  calcium-vitamin D (OSCAL WITH D) 250-125 MG-UNIT tablet, Take 1 tablet by mouth 2 (two) times daily., Disp: , Rfl:  .  Cholecalciferol (VITAMIN D3) 125 MCG (5000 UT) CAPS, Take 1 capsule by mouth 2 (two) times a day., Disp: , Rfl:  .  docusate sodium (COLACE) 100 MG capsule, Take 100 mg by mouth 3 (three) times daily as needed for mild constipation., Disp: , Rfl:  .  levothyroxine (SYNTHROID) 100 MCG tablet, TAKE 1 TABLET BY MOUTH DAILY, Disp: 90 tablet, Rfl: 3 .  lisinopril (ZESTRIL) 20 MG tablet, Take 20 mg by mouth daily., Disp: , Rfl:  .  losartan-hydrochlorothiazide (HYZAAR) 100-12.5 MG tablet, TAKE 1 TABLET BY MOUTH DAILY, Disp: 30 tablet, Rfl: 6 .  Magnesium Carbonate Heavy POWD, by Does not apply route. Natural Vitality Calm magnesium supplement, Disp: , Rfl:  .  ondansetron  (ZOFRAN) 8 MG tablet, Take 1 tablet (8 mg total) by mouth every 8 (eight) hours as needed for nausea or vomiting., Disp: 20 tablet, Rfl: 0 .  palbociclib (IBRANCE) 125 MG tablet, Take 1 tablet (125 mg total) by mouth daily. Take for 21 days on, 7 days off, repeat every 28 days., Disp: 21 tablet, Rfl: 3 .  rosuvastatin (CRESTOR) 5 MG tablet,  TAKE 1 TABLET BY MOUTH DAILY, Disp: 30 tablet, Rfl: 6 .  traZODone (DESYREL) 50 MG tablet, TAKE 1 TABLET BY MOUTH DAILY, Disp: 30 tablet, Rfl: 6 .  potassium chloride (KLOR-CON) 10 MEQ tablet, Take 1 tablet (10 mEq total) by mouth daily for 2 days., Disp: 2 tablet, Rfl: 0   Allergies  Allergen Reactions  . Sulfa Antibiotics Diarrhea    ROS Review of Systems  Constitutional: Negative.   HENT: Negative.   Eyes: Negative.   Respiratory: Negative.   Cardiovascular: Negative.   Gastrointestinal: Negative.   Endocrine: Negative.   Genitourinary: Negative.   Musculoskeletal: Negative.   Skin: Negative.   Allergic/Immunologic: Negative.   Neurological: Negative.   Hematological: Negative.   Psychiatric/Behavioral: Negative.   All other systems reviewed and are negative.     Objective:    Physical Exam Vitals reviewed.  Constitutional:      Appearance: Normal appearance.  HENT:     Mouth/Throat:     Mouth: Mucous membranes are moist.  Eyes:     Pupils: Pupils are equal, round, and reactive to light.  Neck:     Vascular: No carotid bruit.  Cardiovascular:     Rate and Rhythm: Normal rate and regular rhythm.     Pulses: Normal pulses.     Heart sounds: Normal heart sounds.  Pulmonary:     Effort: Pulmonary effort is normal.     Breath sounds: Normal breath sounds.  Abdominal:     General: Bowel sounds are normal.     Palpations: Abdomen is soft. There is no hepatomegaly, splenomegaly or mass.     Tenderness: There is no abdominal tenderness.     Hernia: No hernia is present.  Musculoskeletal:        General: No tenderness.      Cervical back: Neck supple.     Right lower leg: No edema.     Left lower leg: No edema.  Skin:    Findings: No rash.  Neurological:     Mental Status: She is alert and oriented to person, place, and time.     Motor: No weakness.  Psychiatric:        Mood and Affect: Mood and affect normal.        Behavior: Behavior normal.     BP 125/72   Pulse 80   Ht 5\' 5"  (1.651 m)   Wt 224 lb (101.6 kg)   BMI 37.28 kg/m  Wt Readings from Last 3 Encounters:  11/03/19 224 lb (101.6 kg)  10/29/19 222 lb (100.7 kg)  10/27/19 220 lb (99.8 kg)     Health Maintenance Due  Topic Date Due  . Hepatitis C Screening  Never done  . COVID-19 Vaccine (1) Never done  . TETANUS/TDAP  Never done  . COLONOSCOPY  Never done  . PNA vac Low Risk Adult (1 of 2 - PCV13) Never done  . INFLUENZA VACCINE  08/16/2019    There are no preventive care reminders to display for this patient.  Lab Results  Component Value Date   TSH 0.925 10/27/2019   Lab Results  Component Value Date   WBC 3.1 (L) 10/29/2019   HGB 11.0 (L) 10/29/2019   HCT 31.9 (L) 10/29/2019   MCV 101.6 (H) 10/29/2019   PLT 170 10/29/2019   Lab Results  Component Value Date   NA 137 10/29/2019   K 4.2 10/29/2019   CO2 25 10/29/2019   GLUCOSE 92 10/29/2019   BUN 19 10/29/2019  CREATININE 1.24 (H) 10/29/2019   BILITOT 0.8 10/29/2019   ALKPHOS 65 10/29/2019   AST 22 10/29/2019   ALT 19 10/29/2019   PROT 7.3 10/29/2019   ALBUMIN 4.2 10/29/2019   CALCIUM 8.9 10/29/2019   ANIONGAP 8 10/29/2019   Lab Results  Component Value Date   CHOL 151 02/18/2015   Lab Results  Component Value Date   HDL 65 02/18/2015   Lab Results  Component Value Date   LDLCALC 76 02/18/2015   Lab Results  Component Value Date   TRIG 49 02/18/2015   Lab Results  Component Value Date   CHOLHDL 2.3 02/18/2015   Lab Results  Component Value Date   HGBA1C 5.9 (H) 10/04/2017      Assessment & Plan:   Problem List Items Addressed This  Visit      Cardiovascular and Mediastinum   Essential hypertension - Primary    Blood pressure is under control on present medication.  She has been advised to lose weight slowly.        Musculoskeletal and Integument   Carcinoma of breast metastatic to bone [facility])    Patient is under care of oncologist.  And taking the medication as directed by him.      Relevant Medications   ALPRAZolam (XANAX) 0.5 MG tablet     Other   Anxiety    - Patient experiencing high levels of anxiety.  - Encouraged patient to engage in relaxing activities like yoga, meditation, journaling, going for a walk, or participating in a hobby.  - Encouraged patient to reach out to trusted friends or family members about recent struggles       Relevant Medications   ALPRAZolam (XANAX) 0.5 MG tablet   Obesity (BMI 35.0-39.9 without comorbidity)    - I encouraged the patient to lose weight.  - I educated them on making healthy dietary choices including eating more fruits and vegetables and less fried foods. - I encouraged the patient to exercise more, and educated on the benefits of exercise including weight loss, diabetes prevention, and hypertension prevention.          Meds ordered this encounter  Medications  . ALPRAZolam (XANAX) 0.5 MG tablet    Sig: Take 1 tablet (0.5 mg total) by mouth 2 (two) times daily.    Dispense:  60 tablet    Refill:  0    Follow-up: No follow-ups on file.    [name], MD

## 2019-11-08 NOTE — Assessment & Plan Note (Signed)
-   Patient experiencing high levels of anxiety.  - Encouraged patient to engage in relaxing activities like yoga, meditation, journaling, going for a walk, or participating in a hobby.  - Encouraged patient to reach out to trusted friends or family members about recent struggles 

## 2019-11-08 NOTE — Assessment & Plan Note (Signed)
-   I encouraged the patient to lose weight.  - I educated them on making healthy dietary choices including eating more fruits and vegetables and less fried foods. - I encouraged the patient to exercise more, and educated on the benefits of exercise including weight loss, diabetes prevention, and hypertension prevention.   

## 2019-11-08 NOTE — Assessment & Plan Note (Signed)
Patient is under care of oncologist.  And taking the medication as directed by him.

## 2019-11-10 ENCOUNTER — Other Ambulatory Visit: Payer: Self-pay | Admitting: Oncology

## 2019-11-12 ENCOUNTER — Ambulatory Visit (INDEPENDENT_AMBULATORY_CARE_PROVIDER_SITE_OTHER): Payer: Medicare Other | Admitting: Internal Medicine

## 2019-11-12 ENCOUNTER — Other Ambulatory Visit: Payer: Self-pay

## 2019-11-12 ENCOUNTER — Encounter: Payer: Self-pay | Admitting: Internal Medicine

## 2019-11-12 VITALS — BP 110/60 | HR 75 | Ht 66.0 in | Wt 225.0 lb

## 2019-11-12 DIAGNOSIS — R002 Palpitations: Secondary | ICD-10-CM

## 2019-11-12 DIAGNOSIS — R0602 Shortness of breath: Secondary | ICD-10-CM

## 2019-11-12 DIAGNOSIS — I700 Atherosclerosis of aorta: Secondary | ICD-10-CM | POA: Diagnosis not present

## 2019-11-12 DIAGNOSIS — R42 Dizziness and giddiness: Secondary | ICD-10-CM

## 2019-11-12 DIAGNOSIS — R079 Chest pain, unspecified: Secondary | ICD-10-CM

## 2019-11-12 DIAGNOSIS — I10 Essential (primary) hypertension: Secondary | ICD-10-CM

## 2019-11-12 NOTE — Patient Instructions (Signed)
Medication Instructions:  Your physician recommends that you continue on your current medications as directed. Please refer to the Current Medication list given to you today.  *If you need a refill on your cardiac medications before your next appointment, please call your pharmacy*  Lab Work: none If you have labs (blood work) drawn today and your tests are completely normal, you will receive your results only by: Marland Kitchen MyChart Message (if you have MyChart) OR . A paper copy in the mail If you have any lab test that is abnormal or we need to change your treatment, we will call you to review the results.  Testing/Procedures: Your physician has requested that you have an echocardiogram. Echocardiography is a painless test that uses sound waves to create images of your heart. It provides your doctor with information about the size and shape of your heart and how well your heart's chambers and valves are working. This procedure takes approximately one hour. There are no restrictions for this procedure. You may get an IV, if needed, to receive an ultrasound enhancing agent through to better visualize your heart.    Follow-Up: At [facility], you and your health needs are our priority.  As part of our continuing mission to provide you with exceptional heart care, we have created designated Provider Care Teams.  These Care Teams include your primary Cardiologist (physician) and Advanced Practice Providers (APPs -  Physician Assistants and Nurse Practitioners) who all work together to provide you with the care you need, when you need it.  We recommend signing up for the patient portal called "MyChart".  Sign up information is provided on this After Visit Summary.  MyChart is used to connect with patients for Virtual Visits (Telemedicine).  Patients are able to view lab/test results, encounter notes, upcoming appointments, etc.  Non-urgent messages can be sent to your provider as well.   To learn more  about what you can do with MyChart, go to [email or web address].    Your next appointment:   1 month(s)  The format for your next appointment:   In Person  Provider:   You may see DR Harrell Gave END or one of the following Advanced Practice Providers on your designated Care Team:    Murray Hodgkins, NP  Christell Faith, PA-C  Marrianne Mood, PA-C  Cadence Kathlen Mody, [place]    Echocardiogram An echocardiogram is a procedure that uses painless sound waves (ultrasound) to produce an image of the heart. Images from an echocardiogram can provide important information about:  Signs of coronary artery disease (CAD).  Aneurysm detection. An aneurysm is a weak or damaged part of an artery wall that bulges out from the normal force of blood pumping through the body.  Heart size and shape. Changes in the size or shape of the heart can be associated with certain conditions, including heart failure, aneurysm, and CAD.  Heart muscle function.  Heart valve function.  Signs of a past heart attack.  Fluid buildup around the heart.  Thickening of the heart muscle.  A tumor or infectious growth around the heart valves. Tell a health care provider about:  Any allergies you have.  All medicines you are taking, including vitamins, herbs, eye drops, creams, and over-the-counter medicines.  Any blood disorders you have.  Any surgeries you have had.  Any medical conditions you have.  Whether you are pregnant or may be pregnant. What are the risks? Generally, this is a safe procedure. However, problems may occur, including:  Allergic reaction to  dye (contrast) that may be used during the procedure. What happens before the procedure? No specific preparation is needed. You may eat and drink normally. What happens during the procedure?   An IV tube may be inserted into one of your veins.  You may receive contrast through this tube. A contrast is an injection that improves the quality of  the pictures from your heart.  A gel will be applied to your chest.  A wand-like tool (transducer) will be moved over your chest. The gel will help to transmit the sound waves from the transducer.  The sound waves will harmlessly bounce off of your heart to allow the heart images to be captured in real-time motion. The images will be recorded on a computer. The procedure may vary among health care providers and hospitals. What happens after the procedure?  You may return to your normal, everyday life, including diet, activities, and medicines, unless your health care provider tells you not to do that. Summary  An echocardiogram is a procedure that uses painless sound waves (ultrasound) to produce an image of the heart.  Images from an echocardiogram can provide important information about the size and shape of your heart, heart muscle function, heart valve function, and fluid buildup around your heart.  You do not need to do anything to prepare before this procedure. You may eat and drink normally.  After the echocardiogram is completed, you may return to your normal, everyday life, unless your health care provider tells you not to do that. This information is not intended to replace advice given to you by your health care provider. Make sure you discuss any questions you have with your health care provider. Document Revised: 04/24/2018 Document Reviewed: 02/04/2016 Elsevier Patient Education  Day.

## 2019-11-12 NOTE — Progress Notes (Signed)
New Outpatient Visit Date: 11/12/2019  Referring Provider: Duffy Bruce, MD [place],  [place] [id number]  Chief Complaint: Palpitations and chest pain  HPI:  Alicia [name] is a 69 y.o. female who is being seen today for the evaluation of chest pain at the request of Dr. Ellender Hose. She has a history of remote pulmonary embolism, left breast cancer status postmastectomy and chemotherapy, hypertension, and thyroid disease.  She presented to the [facility] emergency department on 10/27/2019 with palpitations and chest pain.  Labs were unremarkable other than mild hypokalemia.  CTA of the chest was negative for PE.  Today, Alicia [name] reports that she has been doing fairly well since she was seen in the emergency department earlier this month.  That day, she had sudden onset of a fast heart rate with associated chest tightness.  She had just returned from using to the bathroom.  Palpitations and chest pain were preceded by a few minutes of dizziness.  She took a baby aspirin and one half of alprazolam without significant improvement.  Since then, she has noted occasional brief fluttering but no further prolonged episodes of tachycardia and chest pain.  When EMS first arrived on the day of her ED visit, she was told that her heart rate was quite fast.  She reports feeling a little lightheaded and short of breath at that time as well.  She also noted some leg swelling that day, which has since resolved.  Alicia [name] reports having worn an outpatient monitor in the past as well as being monitored overnight on telemetry during the hospitalization in 2017.  She was previously evaluated by Dr. Humphrey Rolls and Dr. Lavera Guise.  Echo and coronary CTA in 2017 by Dr. Humphrey Rolls showed no significant CAD but mild to moderate valvular heart disease.  --------------------------------------------------------------------------------------------------  Cardiovascular History & Procedures: Cardiovascular  Problems:  Palpitations  Chest pain  Risk Factors:  Hypertension and age greater than 78  Cath/PCI:  None  CV Surgery:  None  EP Procedures and Devices:  None available.  Patient reports having worn at least 1 ambulatory cardiac monitor.  Non-Invasive Evaluation(s):  TTE (03/03/2015, Dr. Humphrey Rolls): Normal LV size and systolic function with grade 1 diastolic dysfunction.  Mild left atrial enlargement.  Normal RV size and function.  Mild to moderate mitral and aortic regurgitation.  Mild to moderate pulmonic regurgitation.  Mild tricuspid regurgitation.  Cardiac CTA (02/25/2015, Dr. Humphrey Rolls): Normal right dominant coronary arteries.  Coronary calcium score = 0.  Recent CV Pertinent Labs: Lab Results  Component Value Date   CHOL 151 02/18/2015   HDL 65 02/18/2015   LDLCALC 76 02/18/2015   TRIG 49 02/18/2015   CHOLHDL 2.3 02/18/2015   INR 0.97 01/10/2018   INR 0.9 02/11/2011   K 4.2 10/29/2019   K 3.8 02/11/2011   MG 1.8 10/27/2019   BUN 19 10/29/2019   BUN 14 02/11/2011   CREATININE 1.24 (H) 10/29/2019   CREATININE 0.65 02/11/2011    --------------------------------------------------------------------------------------------------  Past Medical History:  Diagnosis Date  . Anxiety   . Cancer ([place]) 07/2017   left breast  . Depression   . Hypertension   . Hypothyroidism   . Personal history of chemotherapy 2019   LEFT mastectomy-chemo before  . Personal history of radiation therapy 03/2018   LEFT mastectomy  . Rapid heart rate   . Thyroid disease     Past Surgical History:  Procedure Laterality Date  . AXILLARY LYMPH NODE BIOPSY Left 07/16/2017   METASTATIC  MAMMARY CARCINOMA  . BREAST BIOPSY Left 07/16/2017   [place] bx of left breast mass and left breast LN.  INVASIVE MAMMARY CARCINOMA, NO SPECIAL TYPE.   Marland Kitchen BREAST EXCISIONAL BIOPSY Right 2001   benign  . BREAST LUMPECTOMY WITH SENTINEL LYMPH NODE BIOPSY Left 12/06/2017   Procedure: BREAST LUMPECTOMY WITH  SENTINEL LYMPH NODE BX;  Surgeon: Robert Bellow, MD;  Location: [facility] ORS;  Service: General;  Laterality: Left;  . COLONOSCOPY    . MASTECTOMY Left 12/23/2017  . PORTACATH PLACEMENT Right 08/07/2017   Procedure: INSERTION PORT-A-CATH;  Surgeon: Robert Bellow, MD;  Location: [facility] ORS;  Service: General;  Laterality: Right;  . SIMPLE MASTECTOMY WITH AXILLARY SENTINEL NODE BIOPSY Left 12/23/2017   T2,N2 with 6/7 nodes positive. Whole breast radiation.  Surgeon: Robert Bellow, MD;  Location: [facility] ORS;  Service: General;  Laterality: Left;  . TONSILLECTOMY      Current Meds  Medication Sig  . acetaminophen (TYLENOL) 500 MG tablet Take 1,000 mg by mouth every 6 (six) hours as needed for moderate pain or headache.   . albuterol (PROVENTIL HFA;VENTOLIN HFA) 108 (90 Base) MCG/ACT inhaler Inhale 2 puffs into the lungs every 6 (six) hours as needed for wheezing or shortness of breath.  . ALPRAZolam (XANAX) 0.5 MG tablet Take 1 tablet (0.5 mg total) by mouth 2 (two) times daily.  Marland Kitchen amLODipine (NORVASC) 5 MG tablet TAKE 1 TABLET BY MOUTH DAILY  . aspirin EC 81 MG tablet Take 81 mg by mouth at bedtime.   . calcium carbonate (OS-CAL - DOSED IN MG OF ELEMENTAL CALCIUM) 1250 (500 Ca) MG tablet Take 1 tablet by mouth daily with breakfast.  . calcium-vitamin D (OSCAL WITH D) 250-125 MG-UNIT tablet Take 1 tablet by mouth 2 (two) times daily.  . Cholecalciferol (VITAMIN D3) 125 MCG (5000 UT) CAPS Take 1 capsule by mouth 2 (two) times a day.  . docusate sodium (COLACE) 100 MG capsule Take 100 mg by mouth 3 (three) times daily as needed for mild constipation.  Marland Kitchen levothyroxine (SYNTHROID) 100 MCG tablet TAKE 1 TABLET BY MOUTH DAILY  . losartan-hydrochlorothiazide (HYZAAR) 100-12.5 MG tablet TAKE 1 TABLET BY MOUTH DAILY  . ondansetron (ZOFRAN) 8 MG tablet Take 1 tablet (8 mg total) by mouth every 8 (eight) hours as needed for nausea or vomiting.  . palbociclib (IBRANCE) 125 MG tablet Take 1 tablet (125  mg total) by mouth daily. Take for 21 days on, 7 days off, repeat every 28 days.  . rosuvastatin (CRESTOR) 5 MG tablet TAKE 1 TABLET BY MOUTH DAILY  . traZODone (DESYREL) 50 MG tablet TAKE 1 TABLET BY MOUTH DAILY  . [DISCONTINUED] lisinopril (ZESTRIL) 20 MG tablet Take 20 mg by mouth daily.    Allergies: Sulfa antibiotics  Social History   Tobacco Use  . Smoking status: Former Smoker    Packs/day: 0.25    Years: 30.00    Pack years: 7.50    Types: Cigarettes    Quit date: 2019    Years since quitting: 2.8  . Smokeless tobacco: Never Used  Vaping Use  . Vaping Use: Never used  Substance Use Topics  . Alcohol use: Not Currently  . Drug use: Never    Family History  Problem Relation Age of Onset  . Stroke Mother   . Thyroid disease Mother   . Renal Disease Mother   . Stroke Father   . Heart attack Father   . Sudden death Father 2  suicide  . Anuerysm Brother   . Breast cancer Neg Hx     Review of Systems: A 12-system review of systems was performed and was negative except as noted in the HPI.  --------------------------------------------------------------------------------------------------  Physical Exam: BP 110/60 (BP Location: Right Arm, Patient Position: Sitting, Cuff Size: Large)   Pulse 75   Ht 5\' 6"  (1.676 m)   Wt 225 lb (102.1 kg)   SpO2 98%   BMI 36.32 kg/m   General: NAD. HEENT: No conjunctival pallor or scleral icterus. Facemask in place. Neck: Supple without lymphadenopathy, thyromegaly, JVD, or HJR. No carotid bruit. Lungs: Normal work of breathing. Clear to auscultation bilaterally without wheezes or crackles. Heart: Regular rate and rhythm without murmurs, rubs, or gallops. Non-displaced PMI. Abd: Bowel sounds present. Soft, NT/ND without hepatosplenomegaly Ext: No lower extremity edema. Radial, PT, and DP pulses are 2+ bilaterally Skin: Warm and dry without rash. Neuro: CNIII-XII intact. Strength and fine-touch sensation intact in upper  and lower extremities bilaterally. Psych: Normal mood and affect.  EKG: Normal sinus rhythm with isolated PAC's.  Otherwise, no significant abnormality.  Lab Results  Component Value Date   WBC 3.1 (L) 10/29/2019   HGB 11.0 (L) 10/29/2019   HCT 31.9 (L) 10/29/2019   MCV 101.6 (H) 10/29/2019   PLT 170 10/29/2019    Lab Results  Component Value Date   NA 137 10/29/2019   K 4.2 10/29/2019   CL 104 10/29/2019   CO2 25 10/29/2019   BUN 19 10/29/2019   CREATININE 1.24 (H) 10/29/2019   GLUCOSE 92 10/29/2019   ALT 19 10/29/2019    Lab Results  Component Value Date   CHOL 151 02/18/2015   HDL 65 02/18/2015   LDLCALC 76 02/18/2015   TRIG 49 02/18/2015   CHOLHDL 2.3 02/18/2015     --------------------------------------------------------------------------------------------------  ASSESSMENT AND PLAN: Chest pain and palpitations: Alicia [name] reports an episode of sudden onset palpitations accompanied by chest pain, dizziness, and shortness of breath earlier this month.  She was evaluated by EMS and ultimately transported to the emergency department where work-up was unrevealing.  I have reviewed telemetry/EKG tracings obtained by EMS, which showed no significant arrhythmia.  Heart rate was normal.  CTA of the chest in the emergency department was negative for PE.  Mild aortic atherosclerosis was noted but no significant coronary artery calcification.  Prior cardiac work-up in 2017 by Dr. Humphrey Rolls was notable for normal cardiac CTA without evidence of CAD.  Echocardiogram showed mild to moderate regurgitation involving all cardiac valves.  LVEF was normal at that time.  I recommended that we obtain a transthoracic echocardiogram to assess for new structural abnormalities.  I will reach out to Dr. Jennette Kettle office to request prior cardiac event monitor.  Based on these results, we will determine the need for repeat ambulatory cardiac monitoring, particularly if symptoms recur.  We will defer  medication changes at this time.  Aortic atherosclerosis: Incidentally noted on CTA chest during recent ED visit.  I recommend continued risk factor modification, including aspirin and statin therapy.  Follow-up: Return to clinic in 1 month.  Nelva Bush, MD 11/13/2019 11:35 AM

## 2019-11-13 ENCOUNTER — Encounter: Payer: Self-pay | Admitting: Internal Medicine

## 2019-11-13 DIAGNOSIS — R002 Palpitations: Secondary | ICD-10-CM | POA: Insufficient documentation

## 2019-11-13 DIAGNOSIS — R079 Chest pain, unspecified: Secondary | ICD-10-CM | POA: Insufficient documentation

## 2019-11-22 NOTE — Progress Notes (Signed)
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  INTERVAL HISTORY: Patient returns to clinic today for further evaluation and continuation of treatment.  She currently feels well and is asymptomatic.  She continues to tolerate Ibrance, Faslodex, and Xgeva well without significant side effects. She does not complain of pain today.  She continues to have a mild peripheral neuropathy, but no other neurologic complaints.  She denies any chest pain, shortness of breath, cough, or hemoptysis.  She denies any nausea, vomiting, constipation, or diarrhea.  She has no urinary complaints.  Patient offers no specific complaints today.  REVIEW OF SYSTEMS:   Review of Systems  Constitutional: Negative for fever, malaise/fatigue and weight loss.  HENT: Negative for congestion.   Respiratory: Negative.  Negative for cough and shortness of breath.   Cardiovascular: Negative.  Negative for chest pain and leg swelling.  Gastrointestinal: Negative.  Negative for abdominal pain, constipation, diarrhea and nausea.  Genitourinary: Negative.  Negative for dysuria and urgency.  Musculoskeletal: Negative.  Negative for back pain and joint pain.  Skin: Negative.  Negative for rash.  Neurological: Positive for tingling and sensory change. Negative for dizziness, focal weakness, weakness and headaches.    Psychiatric/Behavioral: The patient is nervous/anxious.     As per HPI. Otherwise, a complete review of systems is negative.  PAST MEDICAL HISTORY: Past Medical History:  Diagnosis Date  . Anxiety   . Cancer ([place]) 07/2017   left breast  . Depression   . Hypertension   . Hypothyroidism   . Personal history of chemotherapy 2019   LEFT mastectomy-chemo before  . Personal history of radiation therapy 03/2018   LEFT mastectomy  . Rapid heart rate   . Thyroid disease     PAST SURGICAL HISTORY: Past Surgical History:  Procedure Laterality Date  . AXILLARY LYMPH NODE BIOPSY Left 07/16/2017   METASTATIC MAMMARY CARCINOMA  . BREAST BIOPSY Left 07/16/2017   [place] bx of left breast mass and left breast LN.  INVASIVE MAMMARY CARCINOMA, NO SPECIAL TYPE.   Marland Kitchen BREAST EXCISIONAL BIOPSY Right 2001   benign  . BREAST LUMPECTOMY WITH SENTINEL LYMPH NODE BIOPSY Left 12/06/2017   Procedure: BREAST LUMPECTOMY WITH SENTINEL LYMPH NODE BX;  Surgeon: Robert Bellow, MD;  Location: [facility] ORS;  Service: General;  Laterality: Left;  . COLONOSCOPY    . MASTECTOMY Left 12/23/2017  . PORTACATH PLACEMENT Right 08/07/2017   Procedure: INSERTION PORT-A-CATH;  Surgeon: Robert Bellow, MD;  Location: [facility] ORS;  Service: General;  Laterality: Right;  . SIMPLE MASTECTOMY WITH AXILLARY SENTINEL NODE BIOPSY Left 12/23/2017   T2,N2 with 6/7 nodes positive. Whole breast radiation.  Surgeon: Robert Bellow, MD;  Location: [facility] ORS;  Service: General;  Laterality: Left;  . TONSILLECTOMY      FAMILY HISTORY: Family History  Problem Relation Age of Onset  . Stroke Mother   . Thyroid disease Mother   . Renal Disease Mother   . Stroke Father   . Heart  attack Father   . Sudden death Father 85       suicide  . Anuerysm Brother   . Breast cancer Neg Hx     ADVANCED DIRECTIVES (Y/N):  N  HEALTH MAINTENANCE: Social History   Tobacco Use  . Smoking status: Former Smoker    Packs/day: 0.25    Years:  30.00    Pack years: 7.50    Types: Cigarettes    Quit date: 2019    Years since quitting: 2.8  . Smokeless tobacco: Never Used  Vaping Use  . Vaping Use: Never used  Substance Use Topics  . Alcohol use: Not Currently  . Drug use: Never     Colonoscopy:  PAP:  Bone density:  Lipid panel:  Allergies  Allergen Reactions  . Sulfa Antibiotics Diarrhea    Current Outpatient Medications  Medication Sig Dispense Refill  . acetaminophen (TYLENOL) 500 MG tablet Take 1,000 mg by mouth every 6 (six) hours as needed for moderate pain or headache.     . albuterol (PROVENTIL HFA;VENTOLIN HFA) 108 (90 Base) MCG/ACT inhaler Inhale 2 puffs into the lungs every 6 (six) hours as needed for wheezing or shortness of breath. 1 Inhaler 2  . ALPRAZolam (XANAX) 0.5 MG tablet Take 1 tablet (0.5 mg total) by mouth 2 (two) times daily. 60 tablet 0  . amLODipine (NORVASC) 5 MG tablet TAKE 1 TABLET BY MOUTH DAILY 90 tablet 3  . aspirin EC 81 MG tablet Take 81 mg by mouth at bedtime.     . calcium carbonate (OS-CAL - DOSED IN MG OF ELEMENTAL CALCIUM) 1250 (500 Ca) MG tablet Take 1 tablet by mouth daily with breakfast.    . calcium-vitamin D (OSCAL WITH D) 250-125 MG-UNIT tablet Take 1 tablet by mouth 2 (two) times daily.    . Cholecalciferol (VITAMIN D3) 125 MCG (5000 UT) CAPS Take 1 capsule by mouth 2 (two) times a day.    . docusate sodium (COLACE) 100 MG capsule Take 100 mg by mouth 3 (three) times daily as needed for mild constipation.    Marland Kitchen levothyroxine (SYNTHROID) 100 MCG tablet TAKE 1 TABLET BY MOUTH DAILY 90 tablet 3  . losartan-hydrochlorothiazide (HYZAAR) 100-12.5 MG tablet TAKE 1 TABLET BY MOUTH DAILY 30 tablet 6  . ondansetron (ZOFRAN) 8 MG tablet Take 1 tablet (8 mg total) by mouth every 8 (eight) hours as needed for nausea or vomiting. 20 tablet 0  . palbociclib (IBRANCE) 125 MG tablet Take 1 tablet (125 mg total) by mouth daily. Take for 21 days on, 7 days off, repeat every 28 days. 21 tablet 3    . rosuvastatin (CRESTOR) 5 MG tablet TAKE 1 TABLET BY MOUTH DAILY 30 tablet 6  . traZODone (DESYREL) 50 MG tablet TAKE 1 TABLET BY MOUTH DAILY 30 tablet 6   No current facility-administered medications for this visit.    OBJECTIVE: Vitals:   11/26/19 1425  BP: 130/64  Pulse: 66  Resp: 16  Temp: (!) 97.4 F (36.3 C)     Body mass index is 36.48 kg/m.    ECOG FS:0 - Asymptomatic  General: Well-developed, well-nourished, no acute distress. Eyes: Pink conjunctiva, anicteric sclera. HEENT: Normocephalic, moist mucous membranes. Lungs: No audible wheezing or coughing. Heart: Regular rate and rhythm. Abdomen: Soft, nontender, no obvious distention. Musculoskeletal: No edema, cyanosis, or clubbing. Neuro: Alert, answering all questions appropriately. Cranial nerves grossly intact. Skin: No rashes or petechiae noted. Psych: Normal affect.   LAB RESULTS:  Lab Results  Component Value Date   NA 135 11/26/2019   K 3.8 11/26/2019   CL 101 11/26/2019   CO2 24 11/26/2019   GLUCOSE 105 (H) 11/26/2019   BUN 22 11/26/2019   CREATININE 1.17 (H) 11/26/2019   CALCIUM 9.3 11/26/2019   PROT 7.3 11/26/2019   ALBUMIN 4.4 11/26/2019   AST 20 11/26/2019   ALT 19 11/26/2019   ALKPHOS 63 11/26/2019   BILITOT 0.6 11/26/2019   GFRNONAA 50 (L) 11/26/2019   GFRAA 57 (L) 10/01/2019    Lab Results  Component Value Date   WBC 2.3 (L) 11/26/2019   NEUTROABS 1.3 (L) 11/26/2019   HGB 11.0 (L) 11/26/2019   HCT 31.1 (L) 11/26/2019   MCV 100.6 (H) 11/26/2019   PLT 142 (L) 11/26/2019     STUDIES: No results found. ONCOLOGY HISTORY: Patient initially received neoadjuvant chemotherapy with Adriamycin and Cytoxan followed by weekly Taxol. She only received 4 cycles of weekly Taxol prior to discontinuation of treatment on October 31, 2017 secondary to persistent peripheral neuropathy.  She ultimately required mastectomy and final pathology noted 5 of 6 lymph nodes positive for disease.  Patient  completed adjuvant XRT in mid April 2020.  Pain initiated letrozole in May 2020, this was discontinued secondary to side effects and patient was started on anastrozole.  Nuclear medicine bone scan on June 19, 2019 revealed metastatic disease and anastrozole was subsequently discontinued.  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  PLAN:    1. 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  MRI of the brain on August 24, 2019 reviewed independently with no obvious evidence of metastatic disease.  Patient's CA 27-29 initially increased to 141.9, but  trended down to 53.3.  Continue 125 mg Ibrance for 21 days with 7 days off.  Proceed with Faslodex and Xgeva today.  Return to clinic in 4 weeks for further evaluation and continuation of treatment.  Appreciate clinical pharmacy input.   2.  Peripheral neuropathy: Chronic and unchanged. 3.  Pulmonary embolus: Patient was diagnosed with a small pulmonary embolus on January 10, 2018.  She is no longer on anticoagulation.  There is no evidence of recurrent PE on CT scan from October 27, 2019. 4.  Osteopenia: Patient's most recent bone mineral density on March 03, 2019 reported a T score of -1.8 which is only mildly decreased from 1 year prior when the reported T score was -1.6.  Continue calcium and vitamin D supplementation.  Xgeva as above. 5.  Left hip pain: Resolved.  Secondary to metastatic disease. Patient has completed XRT. 6.  Leukopenia: Chronic and unchanged.  Secondary to Ibrance. 7.  Cough/congestion/shortness of breath: Resolved.   8.  Diarrhea: Resolved with Imodium. 9.  Thrombocytopenia: Mild, monitor. 10.  Anemia: Chronic and unchanged.  Patient expressed understanding and was in agreement with this plan.  She also understands that She can call clinic at any time with any questions, concerns, or complaints.   Cancer Staging Breast cancer, stage 2, left ([place]) Staging form: Breast, AJCC 8th Edition - Clinical stage from 07/30/2017: Stage IIA (cT2, cN1, cM0, G2, ER+, PR+, HER2-) - Signed by Lloyd Huger, MD on 07/30/2017   Lloyd Huger, MD   11/28/2019 7:12 AM

## 2019-11-23 ENCOUNTER — Other Ambulatory Visit: Payer: Self-pay | Admitting: Internal Medicine

## 2019-11-26 ENCOUNTER — Inpatient Hospital Stay (HOSPITAL_BASED_OUTPATIENT_CLINIC_OR_DEPARTMENT_OTHER): Payer: Medicare Other | Admitting: Oncology

## 2019-11-26 ENCOUNTER — Inpatient Hospital Stay: Payer: Medicare Other | Attending: Oncology

## 2019-11-26 ENCOUNTER — Inpatient Hospital Stay: Payer: Medicare Other | Admitting: Pharmacist

## 2019-11-26 ENCOUNTER — Inpatient Hospital Stay: Payer: Medicare Other

## 2019-11-26 ENCOUNTER — Encounter: Payer: Self-pay | Admitting: Oncology

## 2019-11-26 VITALS — BP 130/64 | HR 66 | Temp 97.4°F | Resp 16 | Wt 226.0 lb

## 2019-11-26 DIAGNOSIS — C7951 Secondary malignant neoplasm of bone: Secondary | ICD-10-CM

## 2019-11-26 DIAGNOSIS — C50912 Malignant neoplasm of unspecified site of left female breast: Secondary | ICD-10-CM

## 2019-11-26 DIAGNOSIS — Z5111 Encounter for antineoplastic chemotherapy: Secondary | ICD-10-CM | POA: Insufficient documentation

## 2019-11-26 DIAGNOSIS — C50919 Malignant neoplasm of unspecified site of unspecified female breast: Secondary | ICD-10-CM

## 2019-11-26 LAB — CBC WITH DIFFERENTIAL/PLATELET
Abs Immature Granulocytes: 0.01 10*3/uL (ref 0.00–0.07)
Basophils Absolute: 0 10*3/uL (ref 0.0–0.1)
Basophils Relative: 1 %
Eosinophils Absolute: 0 10*3/uL (ref 0.0–0.5)
Eosinophils Relative: 2 %
HCT: 31.1 % — ABNORMAL LOW (ref 36.0–46.0)
Hemoglobin: 11 g/dL — ABNORMAL LOW (ref 12.0–15.0)
Immature Granulocytes: 0 %
Lymphocytes Relative: 21 %
Lymphs Abs: 0.5 10*3/uL — ABNORMAL LOW (ref 0.7–4.0)
MCH: 35.6 pg — ABNORMAL HIGH (ref 26.0–34.0)
MCHC: 35.4 g/dL (ref 30.0–36.0)
MCV: 100.6 fL — ABNORMAL HIGH (ref 80.0–100.0)
Monocytes Absolute: 0.4 10*3/uL (ref 0.1–1.0)
Monocytes Relative: 18 %
Neutro Abs: 1.3 10*3/uL — ABNORMAL LOW (ref 1.7–7.7)
Neutrophils Relative %: 58 %
Platelets: 142 10*3/uL — ABNORMAL LOW (ref 150–400)
RBC: 3.09 MIL/uL — ABNORMAL LOW (ref 3.87–5.11)
RDW: 15.3 % (ref 11.5–15.5)
WBC: 2.3 10*3/uL — ABNORMAL LOW (ref 4.0–10.5)
nRBC: 0 % (ref 0.0–0.2)

## 2019-11-26 LAB — COMPREHENSIVE METABOLIC PANEL
ALT: 19 U/L (ref 0–44)
AST: 20 U/L (ref 15–41)
Albumin: 4.4 g/dL (ref 3.5–5.0)
Alkaline Phosphatase: 63 U/L (ref 38–126)
Anion gap: 10 (ref 5–15)
BUN: 22 mg/dL (ref 8–23)
CO2: 24 mmol/L (ref 22–32)
Calcium: 9.3 mg/dL (ref 8.9–10.3)
Chloride: 101 mmol/L (ref 98–111)
Creatinine, Ser: 1.17 mg/dL — ABNORMAL HIGH (ref 0.44–1.00)
GFR, Estimated: 50 mL/min — ABNORMAL LOW (ref >60)
Glucose, Bld: 105 mg/dL — ABNORMAL HIGH (ref 70–99)
Potassium: 3.8 mmol/L (ref 3.5–5.1)
Sodium: 135 mmol/L (ref 135–145)
Total Bilirubin: 0.6 mg/dL (ref 0.3–1.2)
Total Protein: 7.3 g/dL (ref 6.5–8.1)

## 2019-11-26 MED ORDER — FULVESTRANT 250 MG/5ML IM SOLN
500.0000 mg | Freq: Once | INTRAMUSCULAR | Status: AC
  Administered 2019-11-26: 500 mg via INTRAMUSCULAR
  Filled 2019-11-26: qty 10

## 2019-11-26 MED ORDER — DENOSUMAB 120 MG/1.7ML ~~LOC~~ SOLN
120.0000 mg | Freq: Once | SUBCUTANEOUS | Status: AC
  Administered 2019-11-26: 120 mg via SUBCUTANEOUS
  Filled 2019-11-26: qty 1.7

## 2019-11-26 MED ORDER — FULVESTRANT 250 MG/5ML IM SOLN: 500.0000 mg | Freq: Once | INTRAMUSCULAR | Status: DC

## 2019-11-26 MED ORDER — DENOSUMAB 120 MG/1.7ML ~~LOC~~ SOLN: 120.0000 mg | Freq: Once | SUBCUTANEOUS | Status: DC

## 2019-11-26 NOTE — Progress Notes (Signed)
Patient here for follow up. She is being followed by cardiology for rapid heart rate.

## 2019-11-26 NOTE — Progress Notes (Signed)
[place]  [phone] [phone]  Patient Care Team: [name], MD as PCP - General (Internal Medicine) Rico Junker, RN as Registered Nurse Lloyd Huger, MD as Consulting Physician (Oncology) Bary Castilla Forest Gleason, MD as Consulting Physician (General Surgery) Bary Castilla Forest Gleason, MD as Consulting Physician (General Surgery) Noreene Filbert, MD as Referring Physician (Radiation Oncology) Jeral Fruit, RN as Registered Nurse   Name of the patient: Alicia [name]  [id number]  1950-11-03   Date of visit: 11/26/19  HPI: Patient is a 69 y.o. female with recurrent stage IV ER/PR positive, HER2 negative breast cancer. Patient started on Ibrance on 07/09/19.  Reason for Consult: Oral chemotherapy follow-up for Ibrance (palbociclib) therapy, s/p cycle 5.   PAST MEDICAL HISTORY: Past Medical History:  Diagnosis Date  . Anxiety   . Cancer ([place]) 07/2017   left breast  . Depression   . Hypertension   . Hypothyroidism   . Personal history of chemotherapy 2019   LEFT mastectomy-chemo before  . Personal history of radiation therapy 03/2018   LEFT mastectomy  . Rapid heart rate   . Thyroid disease     PAST SURGICAL HISTORY:  Past Surgical History:  Procedure Laterality Date  . AXILLARY LYMPH NODE BIOPSY Left 07/16/2017   METASTATIC MAMMARY CARCINOMA  . BREAST BIOPSY Left 07/16/2017   [place] bx of left breast mass and left breast LN.  INVASIVE MAMMARY CARCINOMA, NO SPECIAL TYPE.   Marland Kitchen BREAST EXCISIONAL BIOPSY Right 2001   benign  . BREAST LUMPECTOMY WITH SENTINEL LYMPH NODE BIOPSY Left 12/06/2017   Procedure: BREAST LUMPECTOMY WITH SENTINEL LYMPH NODE BX;  Surgeon: Robert Bellow, MD;  Location: [facility] ORS;  Service: General;  Laterality: Left;  . COLONOSCOPY    . MASTECTOMY Left 12/23/2017  . PORTACATH PLACEMENT Right 08/07/2017   Procedure: INSERTION PORT-A-CATH;  Surgeon: Robert Bellow, MD;   Location: [facility] ORS;  Service: General;  Laterality: Right;  . SIMPLE MASTECTOMY WITH AXILLARY SENTINEL NODE BIOPSY Left 12/23/2017   T2,N2 with 6/7 nodes positive. Whole breast radiation.  Surgeon: Robert Bellow, MD;  Location: [facility] ORS;  Service: General;  Laterality: Left;  . TONSILLECTOMY      HEMATOLOGY/ONCOLOGY HISTORY:  Oncology History Overview Note  Patient is a 69 year old female who recently self palpated a mass on her left breast.  Subsequent imaging and biopsy revealed the above-stated breast cancer.  Case was also discussed extensively at case conference.  Given the size and the stage of patient's malignancy, she will benefit from neoadjuvant chemotherapy using Adriamycin, Cytoxan, and Taxol.  Patient will also require Neulasta support.  Will get CT scan of the chest, abdomen, and pelvis to assess for any metastatic disease.  Patient will also require port placement and MUGA prior to initiating treatment  CT abdomen/pelvis/chest did not reveal any suspicious lesions concerning for metastatic disease. (08/01/17)  Port-A-Cath placed on 08/07/2017.  Cycle 1 day 1 of AC was given on 08/08/17.     Breast cancer, stage 2, left ([place])  07/24/2017 Initial Diagnosis   Breast cancer, stage 2, left ([place])   07/30/2017 Cancer Staging   Staging form: Breast, AJCC 8th Edition - Clinical stage from 07/30/2017: Stage IIA (cT2, cN1, cM0, G2, ER+, PR+, HER2-) - Signed by Lloyd Huger, MD on 07/30/2017   08/08/2017 - 10/31/2017 Chemotherapy   The patient had DOXOrubicin (ADRIAMYCIN) chemo injection 130 mg, 60 mg/m2 = 130 mg, Intravenous,  Once, 4 of 4 cycles  Administration: 130 mg (08/08/2017), 130 mg (08/22/2017), 130 mg (09/05/2017), 130 mg (09/19/2017) palonosetron (ALOXI) injection 0.25 mg, 0.25 mg, Intravenous,  Once, 4 of 4 cycles Administration: 0.25 mg (08/08/2017), 0.25 mg (08/22/2017), 0.25 mg (09/05/2017), 0.25 mg (09/19/2017) pegfilgrastim-cbqv (UDENYCA) injection 6 mg, 6 mg, Subcutaneous,  Once, 4 of 4 cycles Administration: 6 mg (08/09/2017), 6 mg (08/23/2017), 6 mg (09/06/2017), 6 mg (09/20/2017) cyclophosphamide (CYTOXAN) 1,300 mg in sodium chloride 0.9 % 250 mL chemo infusion, 600 mg/m2 = 1,300 mg, Intravenous,  Once, 4 of 4 cycles Administration: 1,300 mg (08/08/2017), 1,300 mg (08/22/2017), 1,300 mg (09/05/2017), 1,300 mg (09/19/2017) PACLitaxel (TAXOL) 174 mg in sodium chloride 0.9 % 250 mL chemo infusion (</= $RemoveBefor'80mg'vVpDonErToki$ /m2), 80 mg/m2 = 174 mg, Intravenous,  Once, 4 of 12 cycles Dose modification: 72 mg/m2 (original dose 80 mg/m2, Cycle 6, Reason: Dose not tolerated) Administration: 174 mg (10/03/2017), 156 mg (10/17/2017), 156 mg (10/24/2017), 156 mg (10/31/2017) fosaprepitant (EMEND) 150 mg, dexamethasone (DECADRON) 12 mg in sodium chloride 0.9 % 145 mL IVPB, , Intravenous,  Once, 4 of 4 cycles Administration:  (08/08/2017),  (08/22/2017),  (09/05/2017),  (09/19/2017)  for chemotherapy treatment.      ALLERGIES:  is allergic to sulfa antibiotics.  MEDICATIONS:  Current Outpatient Medications  Medication Sig Dispense Refill  . acetaminophen (TYLENOL) 500 MG tablet Take 1,000 mg by mouth every 6 (six) hours as needed for moderate pain or headache.     . albuterol (PROVENTIL HFA;VENTOLIN HFA) 108 (90 Base) MCG/ACT inhaler Inhale 2 puffs into the lungs every 6 (six) hours as needed for wheezing or shortness of breath. 1 Inhaler 2  . ALPRAZolam (XANAX) 0.5 MG tablet Take 1 tablet (0.5 mg total) by mouth 2 (two) times daily. 60 tablet 0  . amLODipine (NORVASC) 5 MG tablet TAKE 1 TABLET BY MOUTH DAILY 90 tablet 3  . aspirin EC 81 MG tablet Take 81 mg by mouth at bedtime.     . calcium carbonate (OS-CAL - DOSED IN MG OF ELEMENTAL CALCIUM) 1250 (500 Ca) MG tablet Take 1 tablet by mouth daily with breakfast.    . calcium-vitamin D (OSCAL WITH D) 250-125 MG-UNIT tablet Take 1 tablet by mouth 2 (two) times daily.    . Cholecalciferol (VITAMIN D3) 125 MCG (5000 UT) CAPS Take 1 capsule by mouth 2 (two)  times a day.    . docusate sodium (COLACE) 100 MG capsule Take 100 mg by mouth 3 (three) times daily as needed for mild constipation.    Marland Kitchen levothyroxine (SYNTHROID) 100 MCG tablet TAKE 1 TABLET BY MOUTH DAILY 90 tablet 3  . losartan-hydrochlorothiazide (HYZAAR) 100-12.5 MG tablet TAKE 1 TABLET BY MOUTH DAILY 30 tablet 6  . ondansetron (ZOFRAN) 8 MG tablet Take 1 tablet (8 mg total) by mouth every 8 (eight) hours as needed for nausea or vomiting. 20 tablet 0  . palbociclib (IBRANCE) 125 MG tablet Take 1 tablet (125 mg total) by mouth daily. Take for 21 days on, 7 days off, repeat every 28 days. 21 tablet 3  . rosuvastatin (CRESTOR) 5 MG tablet TAKE 1 TABLET BY MOUTH DAILY 30 tablet 6  . traZODone (DESYREL) 50 MG tablet TAKE 1 TABLET BY MOUTH DAILY 30 tablet 6   No current facility-administered medications for this visit.    VITAL SIGNS: There were no vitals taken for this visit. There were no vitals filed for this visit.  Estimated body mass index is 36.32 kg/m as calculated from the following:   Height as of 11/12/19: 5'  6" (1.676 m).   Weight as of 11/12/19: 102.1 kg (225 lb).  LABS: CBC:    Component Value Date/Time   WBC 3.1 (L) 10/29/2019 1338   HGB 11.0 (L) 10/29/2019 1338   HGB 14.2 02/11/2011 1349   HCT 31.9 (L) 10/29/2019 1338   HCT 40.9 02/11/2011 1349   PLT 170 10/29/2019 1338   PLT 151 02/11/2011 1349   MCV 101.6 (H) 10/29/2019 1338   MCV 89 02/11/2011 1349   NEUTROABS 1.9 10/29/2019 1338   LYMPHSABS 0.5 (L) 10/29/2019 1338   MONOABS 0.5 10/29/2019 1338   EOSABS 0.1 10/29/2019 1338   BASOSABS 0.0 10/29/2019 1338   Comprehensive Metabolic Panel:    Component Value Date/Time   NA 137 10/29/2019 1338   NA 142 02/11/2011 1349   K 4.2 10/29/2019 1338   K 3.8 02/11/2011 1349   CL 104 10/29/2019 1338   CL 107 02/11/2011 1349   CO2 25 10/29/2019 1338   CO2 24 02/11/2011 1349   BUN 19 10/29/2019 1338   BUN 14 02/11/2011 1349   CREATININE 1.24 (H) 10/29/2019 1338    CREATININE 0.65 02/11/2011 1349   GLUCOSE 92 10/29/2019 1338   GLUCOSE 98 02/11/2011 1349   CALCIUM 8.9 10/29/2019 1338   CALCIUM 9.0 02/11/2011 1349   AST 22 10/29/2019 1338   AST 27 02/11/2011 1349   ALT 19 10/29/2019 1338   ALT 36 02/11/2011 1349   ALKPHOS 65 10/29/2019 1338   ALKPHOS 122 02/11/2011 1349   BILITOT 0.8 10/29/2019 1338   BILITOT 0.5 02/11/2011 1349   PROT 7.3 10/29/2019 1338   PROT 7.0 02/11/2011 1349   ALBUMIN 4.2 10/29/2019 1338   ALBUMIN 4.1 02/11/2011 1349    RADIOGRAPHIC STUDIES: CT Angio Chest PE W and/or Wo Contrast  Result Date: 10/27/2019 CLINICAL DATA:  69 year old female with chest pain onset at 1000 hours. Palpitations. Dizziness. EXAM: CT ANGIOGRAPHY CHEST WITH CONTRAST TECHNIQUE: Multidetector CT imaging of the chest was performed using the standard protocol during bolus administration of intravenous contrast. Multiplanar CT image reconstructions and MIPs were obtained to evaluate the vascular anatomy. CONTRAST:  52mL OMNIPAQUE IOHEXOL 350 MG/ML SOLN COMPARISON:  Chest radiographs today.  Chest CTA 01/10/2018. FINDINGS: Cardiovascular: Good contrast bolus timing in the pulmonary arterial tree. No focal filling defect identified in the pulmonary arteries to suggest acute pulmonary embolism. Mild Calcified aortic atherosclerosis. No calcified coronary artery atherosclerosis is evident. No cardiomegaly or pericardial effusion. Right chest Port-A-Cath has been removed since 2019. Mediastinum/Nodes: No mediastinal lymphadenopathy. Possible small gastric hiatal hernia. Lungs/Pleura: Major airways are patent. Chronic upper lobe centrilobular emphysema. Anterior left upper lobe subpleural scarring has developed since 2019, likely post radiation related in the setting of previous left mastectomy. Mild dependent atelectasis today. Chronic calcified granuloma and mild regional scarring in the right lower lobe is stable. Upper Abdomen: Negative visible upper abdominal  viscera. Musculoskeletal: Previous left mastectomy. No acute or suspicious osseous lesion identified. Review of the MIP images confirms the above findings. IMPRESSION: 1. Negative for acute pulmonary embolus. 2. Emphysema (ICD10-J43.9). New left upper lobe scarring since 2019, probably post radiation related. Stable post granulomatous scarring in the right lower lobe. Electronically Signed   By: Genevie Ann M.D.   On: 10/27/2019 17:29     Assessment and Plan-  Reviewed CBC plan to continue Ibrance at $RemoveBe'125mg'NAcXWIeCF$  dosing. Patient starts cycle 6 tomorrow.    Oral Chemotherapy Side Effect/Intolerance:  - Reported small amount of blood when she has a bowel movement. This  has only been happening for about a week. She stated that her first stool is a bit hard and she has to strain to get going. Her bowel regimen currently is docusate daily and Miralax occasionally. Discussed using Miralax daily to see if that will prevent her strained bowel movements.  - No other issues reported, her fatigue continues to improve.  Oral Chemotherapy Adherence: No reported missed doses  Medication Access Issues: Discussed the assistance plan for 2022, she has medication on hand to start her next cycle.  Patient expressed understanding and was in agreement with this plan. She also understands that She can call clinic at any time with any questions, concerns, or complaints.   Thank you for allowing me to participate in the care of this very pleasant patient.   Time Total: 10 mins  Visit consisted of counseling and education on dealing with issues of symptom management in the setting of serious and potentially life-threatening illness.Greater than 50%  of this time was spent counseling and coordinating care related to the above assessment and plan.  Signed by: Darl Pikes, PharmD, BCPS, Salley Slaughter, CPP Hematology/Oncology Clinical Pharmacist Practitioner [facility]/HP/AP [facility] [phone]  11/26/2019  1:30 PM

## 2019-11-27 LAB — CANCER ANTIGEN 27.29: CA 27.29: 53.3 U/mL — ABNORMAL HIGH (ref 0.0–38.6)

## 2019-12-03 ENCOUNTER — Other Ambulatory Visit: Payer: Self-pay

## 2019-12-03 ENCOUNTER — Ambulatory Visit (INDEPENDENT_AMBULATORY_CARE_PROVIDER_SITE_OTHER): Payer: Medicare Other

## 2019-12-03 DIAGNOSIS — R002 Palpitations: Secondary | ICD-10-CM | POA: Diagnosis not present

## 2019-12-03 DIAGNOSIS — R079 Chest pain, unspecified: Secondary | ICD-10-CM | POA: Diagnosis not present

## 2019-12-03 LAB — ECHOCARDIOGRAM COMPLETE
Area-P 1/2: 3.43 cm2
MV M vel: 4.78 m/s
MV Peak grad: 91.4 mmHg
P 1/2 time: 433 msec
Radius: 0.4 cm
S' Lateral: 3.3 cm

## 2019-12-14 NOTE — Progress Notes (Signed)
Cardiology Office Note    Date:  12/15/2019   ID:  Alicia [name], DOB [birth year], MRN [id number]  PCP:  [name], MD  Cardiologist:  Nelva Bush, MD  Electrophysiologist:  None   Chief Complaint: Follow-up  History of Present Illness:   Alicia [name] is a 69 y.o. female with history of remote PE, left-sided breast cancer status post mastectomy and chemotherapy recently diagnosed with bony metastasis, aortic atherosclerosis, hypertension, anxiety with panic attack disorder, and thyroid disease who presents for follow-up of recent echo.  She was previously evaluated by Dr. Humphrey Rolls and Dr. Lavera Guise with and coronary CTA in 2017 showing no evidence of CAD with a calcium score of 0.  Echo at that time showed normal LV size and systolic function, grade 1 diastolic dysfunction, mild left atrial enlargement, normal RV systolic function and ventricular cavity size, mild to moderate mitral and aortic regurgitation, mild to moderate pulmonic regurgitation, and mild tricuspid regurgitation.  It was also noted she had previously undergone outpatient cardiac monitoring with details of this being unclear.  We requested these records from her PCP though have not yet received them.  She was seen as a new patient by Dr. Saunders Revel on 11/12/2019 at the request of the ED for chest pain.  She was seen in the ED on 10/27/2019 with palpitations and chest pain.  Labs were unrevealing outside of mild hypokalemia.  CTA of the chest was negative for PE.  There was mild aortic atherosclerosis noted with no evidence of significant coronary artery calcification.  In her initial evaluation with cardiology she was doing well since she had been seen in the ED. going back to the day she was seen in the ED she reported a sudden onset of tachypalpitations with associated chest pain after returning from using the restroom.  There was some associated dizziness.  She did continue to note occasional brief episodes of palpitations  though no prolonged episodes and no chest pain.  She underwent echo on 12/03/2019 which showed an EF of 55 to 60%, no regional wall motion abnormalities, normal LV diastolic function parameters, normal RV systolic function and ventricular cavity size, normal PASP, mild to moderate mitral regurgitation, mild aortic valve insufficiency, and a right atrial pressure of 3 mmHg.  She comes in doing reasonably well from a cardiac perspective.  Since she was last seen she has not had any further chest pain.  She does continue to note intermittent palpitations though these are short-lived and typically resolve when she takes half a Xanax.  She is uncertain exactly how frequent they occur or how long they last.  She also notes some occasional positional dizziness when she gets up in the morning or with positional changes.  She notes occasionally her leg may feel weak for a split second while walking with spontaneous resolution.  No symptoms suggestive of claudication.  Lastly she does note some lower extremity swelling.   Labs independently reviewed: 11/2019 - Hgb 11.0, PLT 142, potassium 3.8, BUN 22, serum creatinine 1.17, albumin 4.4, AST/ALT normal 10/2019 - TSH normal, magnesium 1.8   Past Medical History:  Diagnosis Date  . Anxiety   . Cancer ([place]) 07/2017   left breast  . Depression   . Hypertension   . Hypothyroidism   . Personal history of chemotherapy 2019   LEFT mastectomy-chemo before  . Personal history of radiation therapy 03/2018   LEFT mastectomy  . Rapid heart rate   . Thyroid disease  Past Surgical History:  Procedure Laterality Date  . AXILLARY LYMPH NODE BIOPSY Left 07/16/2017   METASTATIC MAMMARY CARCINOMA  . BREAST BIOPSY Left 07/16/2017   [place] bx of left breast mass and left breast LN.  INVASIVE MAMMARY CARCINOMA, NO SPECIAL TYPE.   Marland Kitchen BREAST EXCISIONAL BIOPSY Right 2001   benign  . BREAST LUMPECTOMY WITH SENTINEL LYMPH NODE BIOPSY Left 12/06/2017   Procedure: BREAST  LUMPECTOMY WITH SENTINEL LYMPH NODE BX;  Surgeon: Robert Bellow, MD;  Location: [facility] ORS;  Service: General;  Laterality: Left;  . COLONOSCOPY    . MASTECTOMY Left 12/23/2017  . PORTACATH PLACEMENT Right 08/07/2017   Procedure: INSERTION PORT-A-CATH;  Surgeon: Robert Bellow, MD;  Location: [facility] ORS;  Service: General;  Laterality: Right;  . SIMPLE MASTECTOMY WITH AXILLARY SENTINEL NODE BIOPSY Left 12/23/2017   T2,N2 with 6/7 nodes positive. Whole breast radiation.  Surgeon: Robert Bellow, MD;  Location: [facility] ORS;  Service: General;  Laterality: Left;  . TONSILLECTOMY      Current Medications: Current Meds  Medication Sig  . acetaminophen (TYLENOL) 500 MG tablet Take 1,000 mg by mouth every 6 (six) hours as needed for moderate pain or headache.   . albuterol (PROVENTIL HFA;VENTOLIN HFA) 108 (90 Base) MCG/ACT inhaler Inhale 2 puffs into the lungs every 6 (six) hours as needed for wheezing or shortness of breath.  . ALPRAZolam (XANAX) 0.5 MG tablet Take 1 tablet (0.5 mg total) by mouth 2 (two) times daily.  Marland Kitchen amLODipine (NORVASC) 5 MG tablet TAKE 1 TABLET BY MOUTH DAILY  . aspirin EC 81 MG tablet Take 81 mg by mouth at bedtime.   . calcium carbonate (OS-CAL - DOSED IN MG OF ELEMENTAL CALCIUM) 1250 (500 Ca) MG tablet Take 1 tablet by mouth daily with breakfast.  . calcium-vitamin D (OSCAL WITH D) 250-125 MG-UNIT tablet Take 1 tablet by mouth 2 (two) times daily.  . Cholecalciferol (VITAMIN D3) 125 MCG (5000 UT) CAPS Take 1 capsule by mouth 2 (two) times a day.  . docusate sodium (COLACE) 100 MG capsule Take 100 mg by mouth 3 (three) times daily as needed for mild constipation.  Marland Kitchen levothyroxine (SYNTHROID) 100 MCG tablet TAKE 1 TABLET BY MOUTH DAILY  . losartan-hydrochlorothiazide (HYZAAR) 100-12.5 MG tablet TAKE 1 TABLET BY MOUTH DAILY  . ondansetron (ZOFRAN) 8 MG tablet Take 1 tablet (8 mg total) by mouth every 8 (eight) hours as needed for nausea or vomiting.  . palbociclib  (IBRANCE) 125 MG tablet Take 1 tablet (125 mg total) by mouth daily. Take for 21 days on, 7 days off, repeat every 28 days.  . rosuvastatin (CRESTOR) 5 MG tablet TAKE 1 TABLET BY MOUTH DAILY  . traZODone (DESYREL) 50 MG tablet TAKE 1 TABLET BY MOUTH DAILY    Allergies:   Sulfa antibiotics   Social History   Socioeconomic History  . Marital status: Widowed    Spouse name: Not on file  . Number of children: Not on file  . Years of education: Not on file  . Highest education level: Not on file  Occupational History  . Not on file  Tobacco Use  . Smoking status: Former Smoker    Packs/day: 0.25    Years: 30.00    Pack years: 7.50    Types: Cigarettes    Quit date: 2019    Years since quitting: 2.9  . Smokeless tobacco: Never Used  Vaping Use  . Vaping Use: Never used  Substance and Sexual Activity  .  Alcohol use: Not Currently  . Drug use: Never  . Sexual activity: Not Currently  Other Topics Concern  . Not on file  Social History Narrative  . Not on file   Social Determinants of Health   Financial Resource Strain:   . Difficulty of Paying Living Expenses: Not on file  Food Insecurity:   . Worried About Charity fundraiser in the Last Year: Not on file  . Ran Out of Food in the Last Year: Not on file  Transportation Needs:   . Lack of Transportation (Medical): Not on file  . Lack of Transportation (Non-Medical): Not on file  Physical Activity:   . Days of Exercise per Week: Not on file  . Minutes of Exercise per Session: Not on file  Stress:   . Feeling of Stress : Not on file  Social Connections:   . Frequency of Communication with Friends and Family: Not on file  . Frequency of Social Gatherings with Friends and Family: Not on file  . Attends Religious Services: Not on file  . Active Member of Clubs or Organizations: Not on file  . Attends Archivist Meetings: Not on file  . Marital Status: Not on file     Family History:  The patient's family  history includes Anuerysm in her brother; Heart attack in her father; Renal Disease in her mother; Stroke in her father and mother; Sudden death (age of onset: 58) in her father; Thyroid disease in her mother. There is no history of Breast cancer.  ROS:   Review of Systems  Constitutional: Negative for chills, diaphoresis, fever, malaise/fatigue and weight loss.  HENT: Negative for congestion.   Eyes: Negative for discharge and redness.  Respiratory: Negative for cough, sputum production, shortness of breath and wheezing.   Cardiovascular: Positive for palpitations and leg swelling. Negative for chest pain, orthopnea, claudication and PND.  Gastrointestinal: Negative for abdominal pain, heartburn, melena, nausea and vomiting.  Musculoskeletal: Negative for falls and myalgias.  Skin: Negative for rash.  Neurological: Negative for dizziness, tingling, tremors, sensory change, speech change, focal weakness, loss of consciousness and weakness.  Endo/Heme/Allergies: Does not bruise/bleed easily.  Psychiatric/Behavioral: Negative for substance abuse. The patient is nervous/anxious.   All other systems reviewed and are negative.    EKGs/Labs/Other Studies Reviewed:    Studies reviewed were summarized above. The additional studies were reviewed today:  2D echo 12/03/2019: 1. Left ventricular ejection fraction, by estimation, is 55 to 60%. The  left ventricle has normal function. The left ventricle has no regional  wall motion abnormalities. Left ventricular diastolic parameters were  normal.  2. Right ventricular systolic function is normal. The right ventricular  size is normal. There is normal pulmonary artery systolic pressure.  3. The mitral valve is normal in structure. Mild to moderate mitral valve  regurgitation.  4. The aortic valve is tricuspid. Aortic valve regurgitation is mild.  5. The inferior vena cava is normal in size with greater than 50%  respiratory variability,  suggesting right atrial pressure of 3 mmHg. __________  MUGA 07/2017: IMPRESSION: Normal LEFT ventricular ejection fraction of 61%. Normal LEFT ventricular wall motion. __________  Nuclear stress test 02/2015:  Defect 1: There is a small defect of mild severity present in the basal anterior location.  Findings consistent with ischemia.  This is an intermediate risk study.  The left ventricular ejection fraction is hyperdynamic (>65%).   EKG:  EKG is not ordered today.    Recent Labs: 10/27/2019:  Magnesium 1.8; TSH 0.925 11/26/2019: ALT 19; BUN 22; Creatinine, Ser 1.17; Hemoglobin 11.0; Platelets 142; Potassium 3.8; Sodium 135  Recent Lipid Panel    Component Value Date/Time   CHOL 151 02/18/2015 0415   TRIG 49 02/18/2015 0415   HDL 65 02/18/2015 0415   CHOLHDL 2.3 02/18/2015 0415   VLDL 10 02/18/2015 0415   LDLCALC 76 02/18/2015 0415    PHYSICAL EXAM:    VS:  BP 120/64   Pulse 81   Ht 5\' 6"  (1.676 m)   Wt 227 lb (103 kg)   BMI 36.64 kg/m   BMI: Body mass index is 36.64 kg/m.  Physical Exam Vitals reviewed.  Constitutional:      Appearance: She is well-developed.  HENT:     Head: Normocephalic and atraumatic.  Eyes:     General:        Right eye: No discharge.        Left eye: No discharge.  Neck:     Vascular: No JVD.  Cardiovascular:     Rate and Rhythm: Normal rate and regular rhythm.     Pulses: No midsystolic click and no opening snap.          Dorsalis pedis pulses are 2+ on the right side and 2+ on the left side.       Posterior tibial pulses are 2+ on the right side and 2+ on the left side.     Heart sounds: Normal heart sounds, S1 normal and S2 normal. Heart sounds not distant. No murmur heard.  No friction rub.  Pulmonary:     Effort: Pulmonary effort is normal. No respiratory distress.     Breath sounds: Normal breath sounds. No decreased breath sounds, wheezing or rales.  Chest:     Chest wall: No tenderness.  Abdominal:     General:  There is no distension.     Palpations: Abdomen is soft.     Tenderness: There is no abdominal tenderness.  Musculoskeletal:     Cervical back: Normal range of motion.     Right lower leg: No edema.     Left lower leg: No edema.     Comments: No appreciable lower extremity swelling  Skin:    General: Skin is warm and dry.     Nails: There is no clubbing.  Neurological:     Mental Status: She is alert and oriented to person, place, and time.  Psychiatric:        Speech: Speech normal.        Behavior: Behavior normal.        Thought Content: Thought content normal.        Judgment: Judgment normal.     Wt Readings from Last 3 Encounters:  12/15/19 227 lb (103 kg)  11/26/19 226 lb (102.5 kg)  11/12/19 225 lb (102.1 kg)     Orthostatic vital signs:  Lying: 118/73, 71 bpm Sitting: 114/72, 78 bpm, "little dizzy" Standing: 113/73, 83 bpm, "little dizzy" Standing x3 minutes: 118/73, 86 bpm   ASSESSMENT & PLAN:   1. Chest pain: Resolved.  Prior noninvasive ischemic evaluation in 2017 was unrevealing.  More recently, chest CTA demonstrated no significant coronary artery calcification.  Recent echo demonstrated no evidence of cardiomyopathy or wall motion abnormalities.  Given she has had resolution of symptoms and with prior reassuring work-up no further ischemic testing is indicated at this time.  Continue primary prevention with aspirin and rosuvastatin.  2. Palpitations: We did not receive  prior outpatient cardiac monitoring report from her PCP.  In this setting we will move forward with a Zio patch for further evaluation.  Recent potassium and TSH normal.  3. Aortic atherosclerosis: Incidentally noted on CTA of the chest during her ED visit in 10/2019 and again noted on recent PET/CT.  Recommend risk factor modification.  Check lipid panel and direct LDL.  She remains on rosuvastatin and aspirin.  4. HTN: Blood pressure is well controlled in the office today.  She remains on  amlodipine and Hyzaar.  5. Mitral regurgitation: Mild to moderate on most recent echo earlier this month.  No murmur noted on exam today.  Follow-up with periodic echo.  Disposition: F/u with Dr. Saunders Revel or an APP in 2 months.   Medication Adjustments/Labs and Tests Ordered: Current medicines are reviewed at length with the patient today.  Concerns regarding medicines are outlined above. Medication changes, Labs and Tests ordered today are summarized above and listed in the Patient Instructions accessible in Encounters.   Signed, Christell Faith, PA-C 12/15/2019 1:23 PM     [address], [place] [address] [phone]

## 2019-12-15 ENCOUNTER — Other Ambulatory Visit: Payer: Self-pay

## 2019-12-15 ENCOUNTER — Ambulatory Visit (INDEPENDENT_AMBULATORY_CARE_PROVIDER_SITE_OTHER): Payer: Medicare Other

## 2019-12-15 ENCOUNTER — Ambulatory Visit (INDEPENDENT_AMBULATORY_CARE_PROVIDER_SITE_OTHER): Payer: Medicare Other | Admitting: Physician Assistant

## 2019-12-15 ENCOUNTER — Encounter: Payer: Self-pay | Admitting: Physician Assistant

## 2019-12-15 VITALS — BP 120/64 | HR 81 | Ht 66.0 in | Wt 227.0 lb

## 2019-12-15 DIAGNOSIS — R002 Palpitations: Secondary | ICD-10-CM

## 2019-12-15 DIAGNOSIS — I340 Nonrheumatic mitral (valve) insufficiency: Secondary | ICD-10-CM | POA: Diagnosis not present

## 2019-12-15 DIAGNOSIS — I700 Atherosclerosis of aorta: Secondary | ICD-10-CM | POA: Diagnosis not present

## 2019-12-15 DIAGNOSIS — I10 Essential (primary) hypertension: Secondary | ICD-10-CM | POA: Diagnosis not present

## 2019-12-15 DIAGNOSIS — Z13220 Encounter for screening for lipoid disorders: Secondary | ICD-10-CM

## 2019-12-15 DIAGNOSIS — R079 Chest pain, unspecified: Secondary | ICD-10-CM

## 2019-12-15 NOTE — Patient Instructions (Signed)
Medication Instructions:  Your physician recommends that you continue on your current medications as directed. Please refer to the Current Medication list given to you today.  *If you need a refill on your cardiac medications before your next appointment, please call your pharmacy*   Lab Work: Lipid and LDL to be drawn today. If you have labs (blood work) drawn today and your tests are completely normal, you will receive your results only by: Marland Kitchen MyChart Message (if you have MyChart) OR . A paper copy in the mail If you have any lab test that is abnormal or we need to change your treatment, we will call you to review the results.   Testing/Procedures:  Your physician has recommended that you wear a Zio monitor for 2 weeks. This monitor is a medical device that records the heart's electrical activity. Doctors most often use these monitors to diagnose arrhythmias. Arrhythmias are problems with the speed or rhythm of the heartbeat. The monitor is a small device applied to your chest. You can wear one while you do your normal daily activities. While wearing this monitor if you have any symptoms to push the button and record what you felt. Once you have worn this monitor for the period of time provider prescribed (Usually 14 days), you will return the monitor device in the postage paid box. Once it is returned they will download the data collected and provide [place] with a report which the provider will then review and we will call you with those results. Important tips:  1. Avoid showering during the first 24 hours of wearing the monitor. 2. Avoid excessive sweating to help maximize wear time. 3. Do not submerge the device, no hot tubs, and no swimming pools. 4. Keep any lotions or oils away from the patch. 5. After 24 hours you may shower with the patch on. Take brief showers with your back facing the shower head.  6. Do not remove patch once it has been placed because that will interrupt data and  decrease adhesive wear time. 7. Push the button when you have any symptoms and write down what you were feeling. 8. Once you have completed wearing your monitor, remove and place into box which has postage paid and place in your outgoing mailbox.  9. If for some reason you have misplaced your box then call our office and we can provide another box and/or mail it off for you.         Follow-Up: At [facility], you and your health needs are our priority.  As part of our continuing mission to provide you with exceptional heart care, we have created designated Provider Care Teams.  These Care Teams include your primary Cardiologist (physician) and Advanced Practice Providers (APPs -  Physician Assistants and Nurse Practitioners) who all work together to provide you with the care you need, when you need it.  We recommend signing up for the patient portal called "MyChart".  Sign up information is provided on this After Visit Summary.  MyChart is used to connect with patients for Virtual Visits (Telemedicine).  Patients are able to view lab/test results, encounter notes, upcoming appointments, etc.  Non-urgent messages can be sent to your provider as well.   To learn more about what you can do with MyChart, go to [email or web address].    Your next appointment:   2 month(s)  The format for your next appointment:   In Person  Provider:    Dr. Saunders Revel    Other Instructions

## 2019-12-16 LAB — LIPID PANEL
Chol/HDL Ratio: 1.9 ratio (ref 0.0–4.4)
Cholesterol, Total: 202 mg/dL — ABNORMAL HIGH (ref 100–199)
HDL: 104 mg/dL (ref >39)
LDL Chol Calc (NIH): 87 mg/dL (ref 0–99)
Triglycerides: 60 mg/dL (ref 0–149)
VLDL Cholesterol Cal: 11 mg/dL (ref 5–40)

## 2019-12-16 LAB — LDL CHOLESTEROL, DIRECT: LDL Direct: 76 mg/dL (ref 0–99)

## 2019-12-19 NOTE — Progress Notes (Signed)
[place]  [phone]) [phone] [phone]) [phone]  ID: Alicia [name] OB: [birth year]  MR#: [id number]  AGT#:[id number]  Patient Care Team: [name], [name], Harrell Gave, [name] Rico Junker, RN as Registered Nurse Lloyd Huger, MD as Consulting Physician (Oncology) Bary Castilla Forest Gleason, MD as Consulting Physician (General Surgery) Bary Castilla Forest Gleason, MD as Consulting Physician (General Surgery) Noreene Filbert, MD as Referring Physician (Radiation Oncology) Jeral Fruit, RN as Registered Nurse   CHIEF COMPLAINT: Recurrent stage IV ER/PR positive, 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  INTERVAL HISTORY: Patient returns to clinic today for further evaluation and continuation of treatment.  She is tolerating Ibrance Faslodex and Xgeva well without significant side effects.  She currently feels well and is asymptomatic. She does not complain of pain today.  She continues to have a mild peripheral neuropathy, but no other neurologic complaints.  She denies any chest pain, shortness of breath, cough, or hemoptysis.  She denies any nausea, vomiting, constipation, or diarrhea.  She has no urinary complaints.  Patient offers no further specific complaints today.  REVIEW OF SYSTEMS:   Review of Systems  Constitutional: Negative for fever, malaise/fatigue and weight loss.  HENT: Negative for congestion.   Respiratory: Negative.  Negative for cough and shortness of breath.   Cardiovascular: Negative.  Negative for chest pain and leg swelling.  Gastrointestinal: Negative.  Negative for abdominal pain, constipation, diarrhea and nausea.  Genitourinary: Negative.  Negative for dysuria and urgency.  Musculoskeletal: Negative.  Negative for back pain and joint pain.  Skin: Negative.  Negative for rash.  Neurological: Positive for tingling and sensory change. Negative for  dizziness, focal weakness, weakness and headaches.  Psychiatric/Behavioral: The patient is nervous/anxious.     As per HPI. Otherwise, a complete review of systems is negative.  PAST MEDICAL HISTORY: Past Medical History:  Diagnosis Date  . Anxiety   . Cancer ([place]) 07/2017   left breast  . Depression   . Hypertension   . Hypothyroidism   . Personal history of chemotherapy 2019   LEFT mastectomy-chemo before  . Personal history of radiation therapy 03/2018   LEFT mastectomy  . Rapid heart rate   . Thyroid disease     PAST SURGICAL HISTORY: Past Surgical History:  Procedure Laterality Date  . AXILLARY LYMPH NODE BIOPSY Left 07/16/2017   METASTATIC MAMMARY CARCINOMA  . BREAST BIOPSY Left 07/16/2017   [place] bx of left breast mass and left breast LN.  INVASIVE MAMMARY CARCINOMA, NO SPECIAL TYPE.   Marland Kitchen BREAST EXCISIONAL BIOPSY Right 2001   benign  . BREAST LUMPECTOMY WITH SENTINEL LYMPH NODE BIOPSY Left 12/06/2017   Procedure: BREAST LUMPECTOMY WITH SENTINEL LYMPH NODE BX;  Surgeon: Robert Bellow, MD;  Location: [facility] ORS;  Service: General;  Laterality: Left;  . COLONOSCOPY    . MASTECTOMY Left 12/23/2017  . PORTACATH PLACEMENT Right 08/07/2017   Procedure: INSERTION PORT-A-CATH;  Surgeon: Robert Bellow, MD;  Location: [facility] ORS;  Service: General;  Laterality: Right;  . SIMPLE MASTECTOMY WITH AXILLARY SENTINEL NODE BIOPSY Left 12/23/2017   T2,N2 with 6/7 nodes positive. Whole breast radiation.  Surgeon: Robert Bellow, MD;  Location: [facility] ORS;  Service: General;  Laterality: Left;  . TONSILLECTOMY      FAMILY HISTORY: Family History  Problem Relation Age of Onset  . Stroke Mother   . Thyroid disease Mother   . Renal Disease Mother   .  Stroke Father   . Heart attack Father   . Sudden death Father 10       suicide  . Anuerysm Brother   . Breast cancer Neg Hx     ADVANCED DIRECTIVES (Y/N):  N  HEALTH MAINTENANCE: Social History   Tobacco Use  . Smoking  status: Former Smoker    Packs/day: 0.25    Years: 30.00    Pack years: 7.50    Types: Cigarettes    Quit date: 2019    Years since quitting: 2.9  . Smokeless tobacco: Never Used  Vaping Use  . Vaping Use: Never used  Substance Use Topics  . Alcohol use: Not Currently  . Drug use: Never     Colonoscopy:  PAP:  Bone density:  Lipid panel:  Allergies  Allergen Reactions  . Sulfa Antibiotics Diarrhea    Current Outpatient Medications  Medication Sig Dispense Refill  . acetaminophen (TYLENOL) 500 MG tablet Take 1,000 mg by mouth every 6 (six) hours as needed for moderate pain or headache.     . ALPRAZolam (XANAX) 0.5 MG tablet Take 1 tablet (0.5 mg total) by mouth 2 (two) times daily. 60 tablet 0  . amLODipine (NORVASC) 5 MG tablet TAKE 1 TABLET BY MOUTH DAILY 90 tablet 3  . aspirin EC 81 MG tablet Take 81 mg by mouth at bedtime.     . calcium carbonate (OS-CAL - DOSED IN MG OF ELEMENTAL CALCIUM) 1250 (500 Ca) MG tablet Take 1 tablet by mouth daily with breakfast.    . calcium-vitamin D (OSCAL WITH D) 250-125 MG-UNIT tablet Take 1 tablet by mouth 2 (two) times daily.    . Cholecalciferol (VITAMIN D3) 125 MCG (5000 UT) CAPS Take 1 capsule by mouth 2 (two) times a day.    . docusate sodium (COLACE) 100 MG capsule Take 100 mg by mouth 3 (three) times daily as needed for mild constipation.    Marland Kitchen levothyroxine (SYNTHROID) 100 MCG tablet TAKE 1 TABLET BY MOUTH DAILY 90 tablet 3  . losartan-hydrochlorothiazide (HYZAAR) 100-12.5 MG tablet TAKE 1 TABLET BY MOUTH DAILY 30 tablet 6  . ondansetron (ZOFRAN) 8 MG tablet Take 1 tablet (8 mg total) by mouth every 8 (eight) hours as needed for nausea or vomiting. 20 tablet 0  . palbociclib (IBRANCE) 125 MG tablet Take 1 tablet (125 mg total) by mouth daily. Take for 21 days on, 7 days off, repeat every 28 days. 21 tablet 3  . rosuvastatin (CRESTOR) 5 MG tablet TAKE 1 TABLET BY MOUTH DAILY 30 tablet 6  . traZODone (DESYREL) 50 MG tablet TAKE 1  TABLET BY MOUTH DAILY 30 tablet 6  . albuterol (PROVENTIL HFA;VENTOLIN HFA) 108 (90 Base) MCG/ACT inhaler Inhale 2 puffs into the lungs every 6 (six) hours as needed for wheezing or shortness of breath. (Patient not taking: Reported on 12/24/2019) 1 Inhaler 2   No current facility-administered medications for this visit.    OBJECTIVE: Vitals:   12/24/19 1412  BP: 118/73  Pulse: 76  Resp: 16  Temp: (!) 97.1 F (36.2 C)  SpO2: 99%     Body mass index is 36.86 kg/m.    ECOG FS:0 - Asymptomatic  General: Well-developed, well-nourished, no acute distress. Eyes: Pink conjunctiva, anicteric sclera. HEENT: Normocephalic, moist mucous membranes. Lungs: No audible wheezing or coughing. Heart: Regular rate and rhythm. Abdomen: Soft, nontender, no obvious distention. Musculoskeletal: No edema, cyanosis, or clubbing. Neuro: Alert, answering all questions appropriately. Cranial nerves grossly intact. Skin: No rashes  or petechiae noted. Psych: Normal affect.   LAB RESULTS:  Lab Results  Component Value Date   NA 135 12/24/2019   K 3.9 12/24/2019   CL 99 12/24/2019   CO2 26 12/24/2019   GLUCOSE 106 (H) 12/24/2019   BUN 17 12/24/2019   CREATININE 1.00 12/24/2019   CALCIUM 10.1 12/24/2019   PROT 7.4 12/24/2019   ALBUMIN 4.3 12/24/2019   AST 20 12/24/2019   ALT 19 12/24/2019   ALKPHOS 69 12/24/2019   BILITOT 0.9 12/24/2019   GFRNONAA >60 12/24/2019   GFRAA 57 (L) 10/01/2019    Lab Results  Component Value Date   WBC 2.8 (L) 12/24/2019   NEUTROABS 1.7 12/24/2019   HGB 11.8 (L) 12/24/2019   HCT 34.2 (L) 12/24/2019   MCV 101.5 (H) 12/24/2019   PLT 147 (L) 12/24/2019     STUDIES: ECHOCARDIOGRAM COMPLETE  Result Date: 12/03/2019    ECHOCARDIOGRAM REPORT   Patient Name:   Alicia [name] Date of Exam: 12/03/2019 Medical Rec #:  [id number]         Height:       66.0 in Accession #:    [id number]        Weight:       226.0 lb Date of Birth:  03-Aug-1950         BSA:           2.106 m Patient Age:    70 years          BP:           110/60 mmHg Patient Gender: F                 HR:           72 bpm. Exam Location:  [place] Procedure: 2D Echo, Cardiac Doppler and Color Doppler Indications:    R07.9* Chest pain, unspecified; R00.2 Palpitations  History:        Patient has prior history of Echocardiogram examinations, most                 recent 03/03/2015. CA breast/Pulmonary Emboli, Aortic Valve                 Disease and Mitral Valve Disease, Arrythmias:Palpitations,                 Signs/Symptoms:Chest Pain; Risk Factors:Hypertension and Former                 Smoker.  Sonographer:    Geradine Girt Referring Phys: [place]  1. Left ventricular ejection fraction, by estimation, is 55 to 60%. The left ventricle has normal function. The left ventricle has no regional wall motion abnormalities. Left ventricular diastolic parameters were normal.  2. Right ventricular systolic function is normal. The right ventricular size is normal. There is normal pulmonary artery systolic pressure.  3. The mitral valve is normal in structure. Mild to moderate mitral valve regurgitation.  4. The aortic valve is tricuspid. Aortic valve regurgitation is mild.  5. The inferior vena cava is normal in size with greater than 50% respiratory variability, suggesting right atrial pressure of 3 mmHg. FINDINGS  Left Ventricle: Left ventricular ejection fraction, by estimation, is 55 to 60%. The left ventricle has normal function. The left ventricle has no regional wall motion abnormalities. The left ventricular internal cavity size was normal in size. There is  no left ventricular hypertrophy. Left ventricular diastolic parameters were normal. Right Ventricle: The right ventricular  size is normal. No increase in right ventricular wall thickness. Right ventricular systolic function is normal. There is normal pulmonary artery systolic pressure. The tricuspid regurgitant velocity is 2.58 m/s, and   with an assumed right atrial pressure of 3 mmHg, the estimated right ventricular systolic pressure is 28.6 mmHg. Left Atrium: Left atrial size was normal in size. Right Atrium: Right atrial size was normal in size. Pericardium: There is no evidence of pericardial effusion. Mitral Valve: The mitral valve is normal in structure. Mild to moderate mitral valve regurgitation. Tricuspid Valve: The tricuspid valve is normal in structure. Tricuspid valve regurgitation is mild. Aortic Valve: The aortic valve is tricuspid. Aortic valve regurgitation is mild. Aortic regurgitation PHT measures 433 msec. Pulmonic Valve: The pulmonic valve was normal in structure. Pulmonic valve regurgitation is trivial. Aorta: The aortic root and ascending aorta are structurally normal, with no evidence of dilitation. Venous: The inferior vena cava is normal in size with greater than 50% respiratory variability, suggesting right atrial pressure of 3 mmHg. IAS/Shunts: No atrial level shunt detected by color flow Doppler.  LEFT VENTRICLE PLAX 2D LVIDd:         4.40 cm  Diastology LVIDs:         3.30 cm  LV e' medial:    5.98 cm/s LV PW:         1.00 cm  LV E/e' medial:  12.8 LV IVS:        0.75 cm  LV e' lateral:   9.25 cm/s LVOT diam:     1.90 cm  LV E/e' lateral: 8.3 LV SV:         50 LV SV Index:   24 LVOT Area:     2.84 cm  RIGHT VENTRICLE RV S prime:     13.10 cm/s TAPSE (M-mode): 2.4 cm LEFT ATRIUM             Index       RIGHT ATRIUM           Index LA diam:        4.00 cm 1.90 cm/m  RA Area:     11.30 cm LA Vol (A2C):   38.9 ml 18.47 ml/m RA Volume:   21.40 ml  10.16 ml/m LA Vol (A4C):   34.7 ml 16.47 ml/m LA Biplane Vol: 40.2 ml 19.08 ml/m  AORTIC VALVE LVOT Vmax:   77.00 cm/s LVOT Vmean:  55.000 cm/s LVOT VTI:    0.178 m AI PHT:      433 msec  AORTA Ao Root diam: 2.70 cm Ao Asc diam:  3.00 cm MITRAL VALVE                 TRICUSPID VALVE MV Area (PHT): 3.43 cm      TR Peak grad:   26.6 mmHg MV Decel Time: 221 msec      TR Vmax:         258.00 cm/s MR Peak grad:    91.4 mmHg MR Mean grad:    56.0 mmHg   SHUNTS MR Vmax:         478.00 cm/s Systemic VTI:  0.18 m MR Vmean:        348.0 cm/s  Systemic Diam: 1.90 cm MR PISA:         1.01 cm MR PISA Eff ROA: 6 mm MR PISA Radius:  0.40 cm MV E velocity: 76.70 cm/s MV A velocity: 86.10 cm/s MV E/A ratio:  0.89 Kate Sable MD  Electronically signed by Kate Sable MD Signature Date/Time: 12/03/2019/4:26:50 PM    Final    ONCOLOGY HISTORY: Patient initially received neoadjuvant chemotherapy with Adriamycin and Cytoxan followed by weekly Taxol. She only received 4 cycles of weekly Taxol prior to discontinuation of treatment on October 31, 2017 secondary to persistent peripheral neuropathy.  She ultimately required mastectomy and final pathology noted 5 of 6 lymph nodes positive for disease.  Patient completed adjuvant XRT in mid April 2020.  Pain initiated letrozole in May 2020, this was discontinued secondary to side effects and patient was started on anastrozole.  Nuclear medicine bone scan on June 19, 2019 revealed metastatic disease and anastrozole was subsequently discontinued.  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  PLAN:    1. 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  MRI of the brain on August 24, 2019 reviewed independently with no obvious evidence of metastatic disease.  Patient's CA 27-29 initially increased to 141.9, but continues to trend down and is now 52.2. Continue 125 mg Ibrance for 21 days with 7 days off.  Proceed with Faslodex and Xgeva today.  Return to clinic in 4 weeks for further evaluation and continuation of treatment.  Appreciate clinical pharmacy input. 2.  Peripheral neuropathy: Chronic and unchanged. 3.   Pulmonary embolus: Patient was diagnosed with a small pulmonary embolus on January 10, 2018.  She is no longer on anticoagulation.  There was no evidence of recurrent PE on CT scan from October 27, 2019. 4.  Osteopenia: Patient's most recent bone mineral density on March 03, 2019 reported a T score of -1.8 which is only mildly decreased from 1 year prior when the reported T score was -1.6.  Continue calcium and vitamin D supplementation.  Xgeva as above. 5.  Left hip pain: Resolved.  Secondary to metastatic disease. Patient has completed XRT. 6.  Leukopenia: Chronic and unchanged.  Secondary to Ibrance. 7.  Cough/congestion/shortness of breath: Resolved.   8.  Diarrhea: Resolved with Imodium. 9.  Thrombocytopenia: Mild, monitor. 10.  Anemia: Hemoglobin has improved to 11.8.  Patient expressed understanding and was in agreement with this plan. She also understands that She can call clinic at any time with any questions, concerns, or complaints.   Cancer Staging Breast cancer, stage 2, left ([place]) Staging form: Breast, AJCC 8th Edition - Clinical stage from 07/30/2017: Stage IIA (cT2, cN1, cM0, G2, ER+, PR+, HER2-) - Signed by Lloyd Huger, MD on 07/30/2017   Lloyd Huger, MD   12/26/2019 7:48 AM

## 2019-12-24 ENCOUNTER — Inpatient Hospital Stay: Payer: Medicare Other | Attending: Oncology

## 2019-12-24 ENCOUNTER — Other Ambulatory Visit: Payer: Self-pay

## 2019-12-24 ENCOUNTER — Inpatient Hospital Stay (HOSPITAL_BASED_OUTPATIENT_CLINIC_OR_DEPARTMENT_OTHER): Payer: Medicare Other | Admitting: Oncology

## 2019-12-24 ENCOUNTER — Inpatient Hospital Stay: Payer: Medicare Other

## 2019-12-24 ENCOUNTER — Inpatient Hospital Stay: Payer: Medicare Other | Admitting: Pharmacist

## 2019-12-24 VITALS — BP 118/73 | HR 76 | Temp 97.1°F | Resp 16 | Wt 228.4 lb

## 2019-12-24 DIAGNOSIS — Z5111 Encounter for antineoplastic chemotherapy: Secondary | ICD-10-CM | POA: Diagnosis not present

## 2019-12-24 DIAGNOSIS — C50912 Malignant neoplasm of unspecified site of left female breast: Secondary | ICD-10-CM | POA: Diagnosis not present

## 2019-12-24 DIAGNOSIS — C7951 Secondary malignant neoplasm of bone: Secondary | ICD-10-CM | POA: Insufficient documentation

## 2019-12-24 DIAGNOSIS — C50919 Malignant neoplasm of unspecified site of unspecified female breast: Secondary | ICD-10-CM

## 2019-12-24 LAB — COMPREHENSIVE METABOLIC PANEL
ALT: 19 U/L (ref 0–44)
AST: 20 U/L (ref 15–41)
Albumin: 4.3 g/dL (ref 3.5–5.0)
Alkaline Phosphatase: 69 U/L (ref 38–126)
Anion gap: 10 (ref 5–15)
BUN: 17 mg/dL (ref 8–23)
CO2: 26 mmol/L (ref 22–32)
Calcium: 10.1 mg/dL (ref 8.9–10.3)
Chloride: 99 mmol/L (ref 98–111)
Creatinine, Ser: 1 mg/dL (ref 0.44–1.00)
GFR, Estimated: >60 mL/min (ref >60)
Glucose, Bld: 106 mg/dL — ABNORMAL HIGH (ref 70–99)
Potassium: 3.9 mmol/L (ref 3.5–5.1)
Sodium: 135 mmol/L (ref 135–145)
Total Bilirubin: 0.9 mg/dL (ref 0.3–1.2)
Total Protein: 7.4 g/dL (ref 6.5–8.1)

## 2019-12-24 LAB — CBC WITH DIFFERENTIAL/PLATELET
Abs Immature Granulocytes: 0.01 10*3/uL (ref 0.00–0.07)
Basophils Absolute: 0 10*3/uL (ref 0.0–0.1)
Basophils Relative: 2 %
Eosinophils Absolute: 0 10*3/uL (ref 0.0–0.5)
Eosinophils Relative: 1 %
HCT: 34.2 % — ABNORMAL LOW (ref 36.0–46.0)
Hemoglobin: 11.8 g/dL — ABNORMAL LOW (ref 12.0–15.0)
Immature Granulocytes: 0 %
Lymphocytes Relative: 18 %
Lymphs Abs: 0.5 10*3/uL — ABNORMAL LOW (ref 0.7–4.0)
MCH: 35 pg — ABNORMAL HIGH (ref 26.0–34.0)
MCHC: 34.5 g/dL (ref 30.0–36.0)
MCV: 101.5 fL — ABNORMAL HIGH (ref 80.0–100.0)
Monocytes Absolute: 0.4 10*3/uL (ref 0.1–1.0)
Monocytes Relative: 16 %
Neutro Abs: 1.7 10*3/uL (ref 1.7–7.7)
Neutrophils Relative %: 63 %
Platelets: 147 10*3/uL — ABNORMAL LOW (ref 150–400)
RBC: 3.37 MIL/uL — ABNORMAL LOW (ref 3.87–5.11)
RDW: 14.4 % (ref 11.5–15.5)
WBC: 2.8 10*3/uL — ABNORMAL LOW (ref 4.0–10.5)
nRBC: 0 % (ref 0.0–0.2)

## 2019-12-24 MED ORDER — DENOSUMAB 120 MG/1.7ML ~~LOC~~ SOLN
120.0000 mg | Freq: Once | SUBCUTANEOUS | Status: AC
  Administered 2019-12-24: 120 mg via SUBCUTANEOUS
  Filled 2019-12-24: qty 1.7

## 2019-12-24 MED ORDER — FULVESTRANT 250 MG/5ML IM SOLN
500.0000 mg | Freq: Once | INTRAMUSCULAR | Status: AC
  Administered 2019-12-24: 500 mg via INTRAMUSCULAR
  Filled 2019-12-24: qty 10

## 2019-12-24 NOTE — Progress Notes (Signed)
Heart monitor that she has to wear until 12/14 had to wear it for 2 weeks. R shoulder and elbow pain that comes and goes x2 months where she had radiation.

## 2019-12-24 NOTE — Progress Notes (Signed)
[place]  [phone] [phone]  Patient Care Team: [name], [name], Harrell Gave, [name] Rico Junker, RN as Registered Nurse Grayland Ormond Kathlene November, MD as Consulting Physician (Oncology) Bary Castilla Forest Gleason, MD as Consulting Physician (General Surgery) Bary Castilla Forest Gleason, MD as Consulting Physician (General Surgery) Noreene Filbert, MD as Referring Physician (Radiation Oncology) Jeral Fruit, RN as Registered Nurse   Name of the patient: Alicia [name]  [id number]  05/01/1950   Date of visit: 12/24/19  HPI: Patient is a 69 y.o. female with recurrent stage IV ER/PR positive, HER2 negative breast cancer. Patient started on Ibrance on 07/09/19.  Reason for Consult: Oral chemotherapy follow-up for Ibrance (palbociclib) therapy, s/p cycle 6.   PAST MEDICAL HISTORY: Past Medical History:  Diagnosis Date  . Anxiety   . Cancer ([place]) 07/2017   left breast  . Depression   . Hypertension   . Hypothyroidism   . Personal history of chemotherapy 2019   LEFT mastectomy-chemo before  . Personal history of radiation therapy 03/2018   LEFT mastectomy  . Rapid heart rate   . Thyroid disease     PAST SURGICAL HISTORY:  Past Surgical History:  Procedure Laterality Date  . AXILLARY LYMPH NODE BIOPSY Left 07/16/2017   METASTATIC MAMMARY CARCINOMA  . BREAST BIOPSY Left 07/16/2017   [place] bx of left breast mass and left breast LN.  INVASIVE MAMMARY CARCINOMA, NO SPECIAL TYPE.   Marland Kitchen BREAST EXCISIONAL BIOPSY Right 2001   benign  . BREAST LUMPECTOMY WITH SENTINEL LYMPH NODE BIOPSY Left 12/06/2017   Procedure: BREAST LUMPECTOMY WITH SENTINEL LYMPH NODE BX;  Surgeon: Robert Bellow, MD;  Location: [facility] ORS;  Service: General;  Laterality: Left;  . COLONOSCOPY    . MASTECTOMY Left 12/23/2017  . PORTACATH PLACEMENT Right 08/07/2017   Procedure:  INSERTION PORT-A-CATH;  Surgeon: Robert Bellow, MD;  Location: [facility] ORS;  Service: General;  Laterality: Right;  . SIMPLE MASTECTOMY WITH AXILLARY SENTINEL NODE BIOPSY Left 12/23/2017   T2,N2 with 6/7 nodes positive. Whole breast radiation.  Surgeon: Robert Bellow, MD;  Location: [facility] ORS;  Service: General;  Laterality: Left;  . TONSILLECTOMY      HEMATOLOGY/ONCOLOGY HISTORY:  Oncology History Overview Note  Patient is a 69 year old female who recently self palpated a mass on her left breast.  Subsequent imaging and biopsy revealed the above-stated breast cancer.  Case was also discussed extensively at case conference.  Given the size and the stage of patient's malignancy, she will benefit from neoadjuvant chemotherapy using Adriamycin, Cytoxan, and Taxol.  Patient will also require Neulasta support.  Will get CT scan of the chest, abdomen, and pelvis to assess for any metastatic disease.  Patient will also require port placement and MUGA prior to initiating treatment  CT abdomen/pelvis/chest did not reveal any suspicious lesions concerning for metastatic disease. (08/01/17)  Port-A-Cath placed on 08/07/2017.  Cycle 1 day 1 of AC was given on 08/08/17.     Breast cancer, stage 2, left ([place])  07/24/2017 Initial Diagnosis   Breast cancer, stage 2, left ([place])   07/30/2017 Cancer Staging   Staging form: Breast, AJCC 8th Edition - Clinical stage from 07/30/2017: Stage IIA (cT2, cN1, cM0, G2, ER+, PR+, HER2-) - Signed by Lloyd Huger, MD on 07/30/2017   08/08/2017 - 10/31/2017 Chemotherapy   The patient had DOXOrubicin (ADRIAMYCIN) chemo injection 130 mg, 60 mg/m2 = 130  mg, Intravenous,  Once, 4 of 4 cycles Administration: 130 mg (08/08/2017), 130 mg (08/22/2017), 130 mg (09/05/2017), 130 mg (09/19/2017) palonosetron (ALOXI) injection 0.25 mg, 0.25 mg, Intravenous,  Once, 4 of 4 cycles Administration: 0.25 mg (08/08/2017), 0.25 mg (08/22/2017), 0.25 mg (09/05/2017), 0.25 mg  (09/19/2017) pegfilgrastim-cbqv (UDENYCA) injection 6 mg, 6 mg, Subcutaneous, Once, 4 of 4 cycles Administration: 6 mg (08/09/2017), 6 mg (08/23/2017), 6 mg (09/06/2017), 6 mg (09/20/2017) cyclophosphamide (CYTOXAN) 1,300 mg in sodium chloride 0.9 % 250 mL chemo infusion, 600 mg/m2 = 1,300 mg, Intravenous,  Once, 4 of 4 cycles Administration: 1,300 mg (08/08/2017), 1,300 mg (08/22/2017), 1,300 mg (09/05/2017), 1,300 mg (09/19/2017) PACLitaxel (TAXOL) 174 mg in sodium chloride 0.9 % 250 mL chemo infusion (</= 64m/m2), 80 mg/m2 = 174 mg, Intravenous,  Once, 4 of 12 cycles Dose modification: 72 mg/m2 (original dose 80 mg/m2, Cycle 6, Reason: Dose not tolerated) Administration: 174 mg (10/03/2017), 156 mg (10/17/2017), 156 mg (10/24/2017), 156 mg (10/31/2017) fosaprepitant (EMEND) 150 mg, dexamethasone (DECADRON) 12 mg in sodium chloride 0.9 % 145 mL IVPB, , Intravenous,  Once, 4 of 4 cycles Administration:  (08/08/2017),  (08/22/2017),  (09/05/2017),  (09/19/2017)  for chemotherapy treatment.      ALLERGIES:  is allergic to sulfa antibiotics.  MEDICATIONS:  Current Outpatient Medications  Medication Sig Dispense Refill  . acetaminophen (TYLENOL) 500 MG tablet Take 1,000 mg by mouth every 6 (six) hours as needed for moderate pain or headache.     . albuterol (PROVENTIL HFA;VENTOLIN HFA) 108 (90 Base) MCG/ACT inhaler Inhale 2 puffs into the lungs every 6 (six) hours as needed for wheezing or shortness of breath. 1 Inhaler 2  . ALPRAZolam (XANAX) 0.5 MG tablet Take 1 tablet (0.5 mg total) by mouth 2 (two) times daily. 60 tablet 0  . amLODipine (NORVASC) 5 MG tablet TAKE 1 TABLET BY MOUTH DAILY 90 tablet 3  . aspirin EC 81 MG tablet Take 81 mg by mouth at bedtime.     . calcium carbonate (OS-CAL - DOSED IN MG OF ELEMENTAL CALCIUM) 1250 (500 Ca) MG tablet Take 1 tablet by mouth daily with breakfast.    . calcium-vitamin D (OSCAL WITH D) 250-125 MG-UNIT tablet Take 1 tablet by mouth 2 (two) times daily.    .  Cholecalciferol (VITAMIN D3) 125 MCG (5000 UT) CAPS Take 1 capsule by mouth 2 (two) times a day.    . docusate sodium (COLACE) 100 MG capsule Take 100 mg by mouth 3 (three) times daily as needed for mild constipation.    .Marland Kitchenlevothyroxine (SYNTHROID) 100 MCG tablet TAKE 1 TABLET BY MOUTH DAILY 90 tablet 3  . losartan-hydrochlorothiazide (HYZAAR) 100-12.5 MG tablet TAKE 1 TABLET BY MOUTH DAILY 30 tablet 6  . ondansetron (ZOFRAN) 8 MG tablet Take 1 tablet (8 mg total) by mouth every 8 (eight) hours as needed for nausea or vomiting. 20 tablet 0  . palbociclib (IBRANCE) 125 MG tablet Take 1 tablet (125 mg total) by mouth daily. Take for 21 days on, 7 days off, repeat every 28 days. 21 tablet 3  . rosuvastatin (CRESTOR) 5 MG tablet TAKE 1 TABLET BY MOUTH DAILY 30 tablet 6  . traZODone (DESYREL) 50 MG tablet TAKE 1 TABLET BY MOUTH DAILY 30 tablet 6   No current facility-administered medications for this visit.    VITAL SIGNS: There were no vitals taken for this visit. There were no vitals filed for this visit.  Estimated body mass index is 36.64 kg/m as calculated from the  following:   Height as of 12/15/19: 5' 6"  (1.676 m).   Weight as of 12/15/19: 103 kg (227 lb).  LABS: CBC:    Component Value Date/Time   WBC 2.3 (L) 11/26/2019 1404   HGB 11.0 (L) 11/26/2019 1404   HGB 14.2 02/11/2011 1349   HCT 31.1 (L) 11/26/2019 1404   HCT 40.9 02/11/2011 1349   PLT 142 (L) 11/26/2019 1404   PLT 151 02/11/2011 1349   MCV 100.6 (H) 11/26/2019 1404   MCV 89 02/11/2011 1349   NEUTROABS 1.3 (L) 11/26/2019 1404   LYMPHSABS 0.5 (L) 11/26/2019 1404   MONOABS 0.4 11/26/2019 1404   EOSABS 0.0 11/26/2019 1404   BASOSABS 0.0 11/26/2019 1404   Comprehensive Metabolic Panel:    Component Value Date/Time   NA 135 11/26/2019 1404   NA 142 02/11/2011 1349   K 3.8 11/26/2019 1404   K 3.8 02/11/2011 1349   CL 101 11/26/2019 1404   CL 107 02/11/2011 1349   CO2 24 11/26/2019 1404   CO2 24 02/11/2011 1349    BUN 22 11/26/2019 1404   BUN 14 02/11/2011 1349   CREATININE 1.17 (H) 11/26/2019 1404   CREATININE 0.65 02/11/2011 1349   GLUCOSE 105 (H) 11/26/2019 1404   GLUCOSE 98 02/11/2011 1349   CALCIUM 9.3 11/26/2019 1404   CALCIUM 9.0 02/11/2011 1349   AST 20 11/26/2019 1404   AST 27 02/11/2011 1349   ALT 19 11/26/2019 1404   ALT 36 02/11/2011 1349   ALKPHOS 63 11/26/2019 1404   ALKPHOS 122 02/11/2011 1349   BILITOT 0.6 11/26/2019 1404   BILITOT 0.5 02/11/2011 1349   PROT 7.3 11/26/2019 1404   PROT 7.0 02/11/2011 1349   ALBUMIN 4.4 11/26/2019 1404   ALBUMIN 4.1 02/11/2011 1349    RADIOGRAPHIC STUDIES: ECHOCARDIOGRAM COMPLETE  Result Date: 12/03/2019    ECHOCARDIOGRAM REPORT   Patient Name:   ASLYN COTTMAN Date of Exam: 12/03/2019 Medical Rec #:  [id number]         Height:       66.0 in Accession #:    [id number]        Weight:       226.0 lb Date of Birth:  1950-09-29         BSA:          2.106 m Patient Age:    40 years          BP:           110/60 mmHg Patient Gender: F                 HR:           72 bpm. Exam Location:  [facility] Procedure: 2D Echo, Cardiac Doppler and Color Doppler Indications:    R07.9* Chest pain, unspecified; R00.2 Palpitations  History:        Patient has prior history of Echocardiogram examinations, most                 recent 03/03/2015. CA breast/Pulmonary Emboli, Aortic Valve                 Disease and Mitral Valve Disease, Arrythmias:Palpitations,                 Signs/Symptoms:Chest Pain; Risk Factors:Hypertension and Former                 Smoker.  Sonographer:    Geradine Girt Referring Phys: [place]  1.  Left ventricular ejection fraction, by estimation, is 55 to 60%. The left ventricle has normal function. The left ventricle has no regional wall motion abnormalities. Left ventricular diastolic parameters were normal.  2. Right ventricular systolic function is normal. The right ventricular size is normal. There is normal pulmonary  artery systolic pressure.  3. The mitral valve is normal in structure. Mild to moderate mitral valve regurgitation.  4. The aortic valve is tricuspid. Aortic valve regurgitation is mild.  5. The inferior vena cava is normal in size with greater than 50% respiratory variability, suggesting right atrial pressure of 3 mmHg. FINDINGS  Left Ventricle: Left ventricular ejection fraction, by estimation, is 55 to 60%. The left ventricle has normal function. The left ventricle has no regional wall motion abnormalities. The left ventricular internal cavity size was normal in size. There is  no left ventricular hypertrophy. Left ventricular diastolic parameters were normal. Right Ventricle: The right ventricular size is normal. No increase in right ventricular wall thickness. Right ventricular systolic function is normal. There is normal pulmonary artery systolic pressure. The tricuspid regurgitant velocity is 2.58 m/s, and  with an assumed right atrial pressure of 3 mmHg, the estimated right ventricular systolic pressure is 29.6 mmHg. Left Atrium: Left atrial size was normal in size. Right Atrium: Right atrial size was normal in size. Pericardium: There is no evidence of pericardial effusion. Mitral Valve: The mitral valve is normal in structure. Mild to moderate mitral valve regurgitation. Tricuspid Valve: The tricuspid valve is normal in structure. Tricuspid valve regurgitation is mild. Aortic Valve: The aortic valve is tricuspid. Aortic valve regurgitation is mild. Aortic regurgitation PHT measures 433 msec. Pulmonic Valve: The pulmonic valve was normal in structure. Pulmonic valve regurgitation is trivial. Aorta: The aortic root and ascending aorta are structurally normal, with no evidence of dilitation. Venous: The inferior vena cava is normal in size with greater than 50% respiratory variability, suggesting right atrial pressure of 3 mmHg. IAS/Shunts: No atrial level shunt detected by color flow Doppler.  LEFT VENTRICLE  PLAX 2D LVIDd:         4.40 cm  Diastology LVIDs:         3.30 cm  LV e' medial:    5.98 cm/s LV PW:         1.00 cm  LV E/e' medial:  12.8 LV IVS:        0.75 cm  LV e' lateral:   9.25 cm/s LVOT diam:     1.90 cm  LV E/e' lateral: 8.3 LV SV:         50 LV SV Index:   24 LVOT Area:     2.84 cm  RIGHT VENTRICLE RV S prime:     13.10 cm/s TAPSE (M-mode): 2.4 cm LEFT ATRIUM             Index       RIGHT ATRIUM           Index LA diam:        4.00 cm 1.90 cm/m  RA Area:     11.30 cm LA Vol (A2C):   38.9 ml 18.47 ml/m RA Volume:   21.40 ml  10.16 ml/m LA Vol (A4C):   34.7 ml 16.47 ml/m LA Biplane Vol: 40.2 ml 19.08 ml/m  AORTIC VALVE LVOT Vmax:   77.00 cm/s LVOT Vmean:  55.000 cm/s LVOT VTI:    0.178 m AI PHT:      433 msec  AORTA Ao Root diam: 2.70  cm Ao Asc diam:  3.00 cm MITRAL VALVE                 TRICUSPID VALVE MV Area (PHT): 3.43 cm      TR Peak grad:   26.6 mmHg MV Decel Time: 221 msec      TR Vmax:        258.00 cm/s MR Peak grad:    91.4 mmHg MR Mean grad:    56.0 mmHg   SHUNTS MR Vmax:         478.00 cm/s Systemic VTI:  0.18 m MR Vmean:        348.0 cm/s  Systemic Diam: 1.90 cm MR PISA:         1.01 cm MR PISA Eff ROA: 6 mm MR PISA Radius:  0.40 cm MV E velocity: 76.70 cm/s MV A velocity: 86.10 cm/s MV E/A ratio:  0.89 Kate Sable MD Electronically signed by Kate Sable MD Signature Date/Time: 12/03/2019/4:26:50 PM    Final      Assessment and Plan-  Reviewed CBC plan to continue Ibrance at 143m dosing. Patient starts cycle 7 tomorrow.    Oral Chemotherapy Side Effect/Intolerance:  - Constipation: her constipation has improve since her last visit. Has started using her Miralax daily in combination with Colace. She does still have occasional constipation. Discussed added prn Senna to her regimen. - No other issues reported. Of note, she no longer reports fatigue.  Oral Chemotherapy Adherence: No reported missed doses  Medication Access Issues: Discussed the assistance plan  for 2022, she signed the application during today's visit. She has medication on hand to start her next cycle tomorrow. She reports she was sent 2 boxes which will help her with medication coverage in 2022 will new assistance is obtained.   Patient expressed understanding and was in agreement with this plan. She also understands that She can call clinic at any time with any questions, concerns, or complaints.   Thank you for allowing me to participate in the care of this very pleasant patient.   Time Total: 10 mins  Visit consisted of counseling and education on dealing with issues of symptom management in the setting of serious and potentially life-threatening illness.Greater than 50%  of this time was spent counseling and coordinating care related to the above assessment and plan.  Signed by: ADarl Pikes PharmD, BCPS, BSalley Slaughter CPP Hematology/Oncology Clinical Pharmacist Practitioner [phone] 12/24/2019 12:49 PM

## 2019-12-25 ENCOUNTER — Telehealth: Payer: Self-pay | Admitting: Pharmacy Technician

## 2019-12-25 LAB — CANCER ANTIGEN 27.29: CA 27.29: 52.2 U/mL — ABNORMAL HIGH (ref 0.0–38.6)

## 2019-12-25 NOTE — Telephone Encounter (Signed)
Oral Oncology Patient Advocate Encounter  Patient's eligibility in the [place] patient assistance program for [name] is due for re-enrollment.    Coordinated with patient to sign application at her appt on 12/24/19.  MD portion completed 12/25/19.  The renewal application has been completed and faxed in an effort to keep the patient's out of pocket expense for Ibrance at $0.     Application completed and faxed to [phone].    [employer] patient assistance phone number for follow up is [phone].    This encounter will be updated until final determination.  [facility] Patient [place] Phone [phone] Fax [phone] 12/25/2019 10:05 AM

## 2019-12-28 ENCOUNTER — Encounter: Payer: Self-pay | Admitting: Internal Medicine

## 2019-12-28 ENCOUNTER — Ambulatory Visit (INDEPENDENT_AMBULATORY_CARE_PROVIDER_SITE_OTHER): Payer: Medicare Other | Admitting: Internal Medicine

## 2019-12-28 ENCOUNTER — Telehealth: Payer: Self-pay | Admitting: *Deleted

## 2019-12-28 ENCOUNTER — Other Ambulatory Visit: Payer: Self-pay

## 2019-12-28 VITALS — BP 126/69 | HR 76 | Ht 66.0 in | Wt 229.5 lb

## 2019-12-28 DIAGNOSIS — F419 Anxiety disorder, unspecified: Secondary | ICD-10-CM

## 2019-12-28 DIAGNOSIS — E669 Obesity, unspecified: Secondary | ICD-10-CM | POA: Diagnosis not present

## 2019-12-28 DIAGNOSIS — I10 Essential (primary) hypertension: Secondary | ICD-10-CM | POA: Diagnosis not present

## 2019-12-28 DIAGNOSIS — R002 Palpitations: Secondary | ICD-10-CM | POA: Diagnosis not present

## 2019-12-28 MED ORDER — ALPRAZOLAM 0.5 MG PO TABS: 0.5000 mg | ORAL_TABLET | Freq: Two times a day (BID) | ORAL | 0 refills | Status: DC

## 2019-12-28 NOTE — Assessment & Plan Note (Signed)
-   Patient experiencing high levels of anxiety.  - Encouraged patient to engage in relaxing activities like yoga, meditation, journaling, going for a walk, or participating in a hobby.  - Encouraged patient to reach out to trusted friends or family members about recent struggles 

## 2019-12-28 NOTE — Assessment & Plan Note (Signed)
-   I encouraged the patient to lose weight.  - I educated them on making healthy dietary choices including eating more fruits and vegetables and less fried foods. - I encouraged the patient to exercise more, and educated on the benefits of exercise including weight loss, diabetes prevention, and hypertension prevention.   

## 2019-12-28 NOTE — Telephone Encounter (Signed)
Patient called in to ask if its recommended for her to proceed with Covid booster given her most recent CBC results. Please advise.

## 2019-12-28 NOTE — Assessment & Plan Note (Signed)
Resolved

## 2019-12-28 NOTE — Assessment & Plan Note (Signed)
-   Today, the patient's blood pressure is well managed on losartan . - The patient will continue the current treatment regimen.  - I encouraged the patient to eat a low-sodium diet to help control blood pressure. - I encouraged the patient to live an active lifestyle and complete activities that increases heart rate to 85% target heart rate at least 5 times per week for one hour.

## 2019-12-28 NOTE — Progress Notes (Signed)
Established Patient Office Visit  Subjective:  Patient ID: Alicia [name], female    DOB: 09/11/1950  Age: 69 y.o. MRN: [id number]  CC:  Chief Complaint  Patient presents with  . Anxiety    Patient needs refill of xanax     HPI  Alicia [name] presents for check up, she is known to have anxiety has a history of cancer of the left breast.  Hypertensive cardiovascular disease.  History of left mastectomy with chemotherapy.  She will also need a Covid shot.  Patient has high level of anxiety and I asked her to read the book how to stop worrying and start living by Randa Lynn  Past Medical History:  Diagnosis Date  . Anxiety   . Cancer ([place]) 07/2017   left breast  . Depression   . Hypertension   . Hypothyroidism   . Personal history of chemotherapy 2019   LEFT mastectomy-chemo before  . Personal history of radiation therapy 03/2018   LEFT mastectomy  . Rapid heart rate   . Thyroid disease     Past Surgical History:  Procedure Laterality Date  . AXILLARY LYMPH NODE BIOPSY Left 07/16/2017   METASTATIC MAMMARY CARCINOMA  . BREAST BIOPSY Left 07/16/2017   [place] bx of left breast mass and left breast LN.  INVASIVE MAMMARY CARCINOMA, NO SPECIAL TYPE.   Marland Kitchen BREAST EXCISIONAL BIOPSY Right 2001   benign  . BREAST LUMPECTOMY WITH SENTINEL LYMPH NODE BIOPSY Left 12/06/2017   Procedure: BREAST LUMPECTOMY WITH SENTINEL LYMPH NODE BX;  Surgeon: Robert Bellow, MD;  Location: [facility] ORS;  Service: General;  Laterality: Left;  . COLONOSCOPY    . MASTECTOMY Left 12/23/2017  . PORTACATH PLACEMENT Right 08/07/2017   Procedure: INSERTION PORT-A-CATH;  Surgeon: Robert Bellow, MD;  Location: [facility] OR[facility];  Service: General;  Laterality: Right;  . SIMPLE MASTECTOMY WITH AXILLARY SENTINEL NODE BIOPSY Left 12/23/2017   T2,N2 with 6/7 nodes positive. Whole breast radiation.  Surgeon: Robert Bellow, MD;  Location: [facility] OR[facility];  Service: General;  Laterality: Left;  . TONSILLECTOMY       Family History  Problem Relation Age of Onset  . Stroke Mother   . Thyroid disease Mother   . Renal Disease Mother   . Stroke Father   . Heart attack Father   . Sudden death Father 8       suicide  . Anuerysm Brother   . Breast cancer Neg Hx     Social History   Socioeconomic History  . Marital status: Widowed    Spouse name: Not on file  . Number of children: Not on file  . Years of education: Not on file  . Highest education level: Not on file  Occupational History  . Not on file  Tobacco Use  . Smoking status: Former Smoker    Packs/day: 0.25    Years: 30.00    Pack years: 7.50    Types: Cigarettes    Quit date: 2019    Years since quitting: 2.9  . Smokeless tobacco: Never Used  Vaping Use  . Vaping Use: Never used  Substance and Sexual Activity  . Alcohol use: Not Currently  . Drug use: Never  . Sexual activity: Not Currently  Other Topics Concern  . Not on file  Social History Narrative  . Not on file   Social Determinants of Health   Financial Resource Strain: Not on file  Food Insecurity: Not on file  Transportation Needs: Not  on file  Physical Activity: Not on file  Stress: Not on file  Social Connections: Not on file  Intimate Partner Violence: Not on file     Current Outpatient Medications:  .  acetaminophen (TYLENOL) 500 MG tablet, Take 1,000 mg by mouth every 6 (six) hours as needed for moderate pain or headache. , Disp: , Rfl:  .  albuterol (PROVENTIL HFA;VENTOLIN HFA) 108 (90 Base) MCG/ACT inhaler, Inhale 2 puffs into the lungs every 6 (six) hours as needed for wheezing or shortness of breath., Disp: 1 Inhaler, Rfl: 2 .  amLODipine (NORVASC) 5 MG tablet, TAKE 1 TABLET BY MOUTH DAILY, Disp: 90 tablet, Rfl: 3 .  aspirin EC 81 MG tablet, Take 81 mg by mouth at bedtime. , Disp: , Rfl:  .  calcium carbonate (OS-CAL - DOSED IN MG OF ELEMENTAL CALCIUM) 1250 (500 Ca) MG tablet, Take 1 tablet by mouth daily with breakfast., Disp: , Rfl:  .   calcium-vitamin D (OSCAL WITH D) 250-125 MG-UNIT tablet, Take 1 tablet by mouth 2 (two) times daily., Disp: , Rfl:  .  Cholecalciferol (VITAMIN D3) 125 MCG (5000 UT) CAPS, Take 1 capsule by mouth 2 (two) times a day., Disp: , Rfl:  .  docusate sodium (COLACE) 100 MG capsule, Take 100 mg by mouth 3 (three) times daily as needed for mild constipation., Disp: , Rfl:  .  levothyroxine (SYNTHROID) 100 MCG tablet, TAKE 1 TABLET BY MOUTH DAILY, Disp: 90 tablet, Rfl: 3 .  losartan-hydrochlorothiazide (HYZAAR) 100-12.5 MG tablet, TAKE 1 TABLET BY MOUTH DAILY, Disp: 30 tablet, Rfl: 6 .  ondansetron (ZOFRAN) 8 MG tablet, Take 1 tablet (8 mg total) by mouth every 8 (eight) hours as needed for nausea or vomiting., Disp: 20 tablet, Rfl: 0 .  palbociclib (IBRANCE) 125 MG tablet, Take 1 tablet (125 mg total) by mouth daily. Take for 21 days on, 7 days off, repeat every 28 days., Disp: 21 tablet, Rfl: 3 .  rosuvastatin (CRESTOR) 5 MG tablet, TAKE 1 TABLET BY MOUTH DAILY, Disp: 30 tablet, Rfl: 6 .  traZODone (DESYREL) 50 MG tablet, TAKE 1 TABLET BY MOUTH DAILY, Disp: 30 tablet, Rfl: 6 .  ALPRAZolam (XANAX) 0.5 MG tablet, Take 1 tablet (0.5 mg total) by mouth 2 (two) times daily., Disp: 60 tablet, Rfl: 0   Allergies  Allergen Reactions  . Sulfa Antibiotics Diarrhea    ROS Review of Systems  Constitutional: Negative.   HENT: Negative.   Eyes: Negative.   Respiratory: Negative.   Cardiovascular: Negative.   Gastrointestinal: Negative.   Endocrine: Negative.   Genitourinary: Negative.   Musculoskeletal: Negative.   Skin: Negative.   Allergic/Immunologic: Negative.   Neurological: Negative.   Hematological: Negative.   Psychiatric/Behavioral: Negative.   All other systems reviewed and are negative.     Objective:    Physical Exam Vitals reviewed.  Constitutional:      Appearance: Normal appearance.  HENT:     Mouth/Throat:     Mouth: Mucous membranes are moist.  Eyes:     Pupils: Pupils are  equal, round, and reactive to light.  Neck:     Vascular: No carotid bruit.  Cardiovascular:     Rate and Rhythm: Normal rate and regular rhythm.     Pulses: Normal pulses.     Heart sounds: Normal heart sounds.  Pulmonary:     Effort: Pulmonary effort is normal. No tachypnea or prolonged expiration.     Breath sounds: Normal breath sounds.  Chest:  Comments: Patient had a left-sided mastectomy. Abdominal:     General: Bowel sounds are normal.     Palpations: Abdomen is soft. There is no hepatomegaly, splenomegaly or mass.     Tenderness: There is no abdominal tenderness.     Hernia: No hernia is present.  Musculoskeletal:        General: No tenderness.     Cervical back: Neck supple.     Right lower leg: No edema.     Left lower leg: No edema.  Skin:    Findings: No rash.  Neurological:     Mental Status: She is alert and oriented to person, place, and time.     Motor: No weakness.  Psychiatric:        Mood and Affect: Mood and affect normal.        Behavior: Behavior normal.     BP 126/69   Pulse 76   Ht 5\' 6"  (1.676 m)   Wt 229 lb 8 oz (104.1 kg)   BMI 37.04 kg/m  Wt Readings from Last 3 Encounters:  12/28/19 229 lb 8 oz (104.1 kg)  12/24/19 228 lb 6.4 oz (103.6 kg)  12/15/19 227 lb (103 kg)     Health Maintenance Due  Topic Date Due  . Hepatitis C Screening  Never done  . COVID-19 Vaccine (1) Never done  . TETANUS/TDAP  Never done  . COLONOSCOPY  Never done  . PNA vac Low Risk Adult (1 of 2 - PCV13) Never done  . INFLUENZA VACCINE  08/16/2019    There are no preventive care reminders to display for this patient.  Lab Results  Component Value Date   TSH 0.925 10/27/2019   Lab Results  Component Value Date   WBC 2.8 (L) 12/24/2019   HGB 11.8 (L) 12/24/2019   HCT 34.2 (L) 12/24/2019   MCV 101.5 (H) 12/24/2019   PLT 147 (L) 12/24/2019   Lab Results  Component Value Date   NA 135 12/24/2019   K 3.9 12/24/2019   CO2 26 12/24/2019   GLUCOSE  106 (H) 12/24/2019   BUN 17 12/24/2019   CREATININE 1.00 12/24/2019   BILITOT 0.9 12/24/2019   ALKPHOS 69 12/24/2019   AST 20 12/24/2019   ALT 19 12/24/2019   PROT 7.4 12/24/2019   ALBUMIN 4.3 12/24/2019   CALCIUM 10.1 12/24/2019   ANIONGAP 10 12/24/2019   Lab Results  Component Value Date   CHOL 202 (H) 12/15/2019   Lab Results  Component Value Date   HDL 104 12/15/2019   Lab Results  Component Value Date   LDLCALC 87 12/15/2019   Lab Results  Component Value Date   TRIG 60 12/15/2019   Lab Results  Component Value Date   CHOLHDL 1.9 12/15/2019   Lab Results  Component Value Date   HGBA1C 5.9 (H) 10/04/2017      Assessment & Plan:   Problem List Items Addressed This Visit      Cardiovascular and Mediastinum   Essential hypertension - Primary    - Today, the patient's blood pressure is well managed on losartan . - The patient will continue the current treatment regimen.  - I encouraged the patient to eat a low-sodium diet to help control blood pressure. - I encouraged the patient to live an active lifestyle and complete activities that increases heart rate to 85% target heart rate at least 5 times per week for one hour.  Other   Anxiety    - Patient experiencing high levels of anxiety.  - Encouraged patient to engage in relaxing activities like yoga, meditation, journaling, going for a walk, or participating in a hobby.  - Encouraged patient to reach out to trusted friends or family members about recent struggles       Relevant Medications   ALPRAZolam (XANAX) 0.5 MG tablet   Obesity (BMI 35.0-39.9 without comorbidity)    - I encouraged the patient to lose weight.  - I educated them on making healthy dietary choices including eating more fruits and vegetables and less fried foods. - I encouraged the patient to exercise more, and educated on the benefits of exercise including weight loss, diabetes prevention, and hypertension prevention.         Palpitations    Resolved.         Meds ordered this encounter  Medications  . ALPRAZolam (XANAX) 0.5 MG tablet    Sig: Take 1 tablet (0.5 mg total) by mouth 2 (two) times daily.    Dispense:  60 tablet    Refill:  0    Follow-up: No follow-ups on file.    [name], MD

## 2019-12-29 ENCOUNTER — Telehealth: Payer: Self-pay | Admitting: *Deleted

## 2019-12-29 NOTE — Telephone Encounter (Signed)
Call returned to patient and left voice mail that she can get the COVID Booster

## 2019-12-29 NOTE — Telephone Encounter (Signed)
Yes

## 2019-12-29 NOTE — Telephone Encounter (Signed)
Patient called asking if she can get the COVID Booster being on Ibrance with a low white count. Please advise

## 2020-01-04 ENCOUNTER — Ambulatory Visit: Payer: Medicare Other

## 2020-01-04 ENCOUNTER — Other Ambulatory Visit: Payer: Self-pay

## 2020-01-06 DIAGNOSIS — R002 Palpitations: Secondary | ICD-10-CM | POA: Diagnosis not present

## 2020-01-08 IMAGING — MG US BREAST BX W LOC DEV 1ST LESION IMG BX SPEC US GUIDE*L*
1 series · 8 of 8 positions shown · non-contrast
Comparison: Previous exam(s).

ADDENDUM:
Pathology of the left breast biopsy revealed A. BREAST, LEFT, [DATE],
5 CM FROM NIPPLE; ULTRASOUND-GUIDED CORE BIOPSY: INVASIVE MAMMARY
CARCINOMA, NO SPECIAL TYPE. Size of invasive carcinoma: 15 mm in
this sample. Histologic grade of invasive carcinoma: Grade 2. Ductal
carcinoma in situ: Present, focal. Lymphovascular invasion: Not
identified. ER/PR/HER2: Immunohistochemistry will be performed on
block A1, with reflex to FISH for HER2 2+. The results will be
reported in an addendum. Comment: The definitive grade will be
assigned on the excisional specimen. These findings were
communicated to Laurinda Odle in Dr. [REDACTED] on
07/17/2017 at [DATE]. Read back procedure was performed.

This was found to be concordant by Dr. Stock.
Recommendation: Surgical and oncology referrals. The patient has an
appointment scheduled with Dr. Wlodek for 08/01/17.
At the patient's request, results and recommendations were relayed
to the patient by phone by Dr. Verd on 07/19/17. The patient stated
she did well following the biopsy. Post biopsy instructions were
reviewed with the patient and all of her questions were answered.
She was encouraged to contact the [HOSPITAL] with any
further questions or concerns. The patient stated Dr. Ollie was
awaiting the results of the biopsy and would contact Dr. [REDACTED] to move up her appointment if the results were positive. Dr.
[REDACTED] was closed at the time of conversation. Dr. [REDACTED] was contacted by phone by Vallin on 07/22/17 with
results. A copy of the pathology report was also faxed to Dr.
[REDACTED]. They will contact Dr. [REDACTED] after Dr.
Joshjax has reviewed the report.
Addendum by Guzha on 07/23/17.
CLINICAL DATA: Ultrasound-guided core needle biopsy was recommended
of a palpable mass in the [DATE] position of the left breast.
EXAM:
ULTRASOUND GUIDED LEFT BREAST CORE NEEDLE BIOPSY

[Series 1: MG view · 0.07mm/px · 8 of 14 slices shown]
[im 1/14]
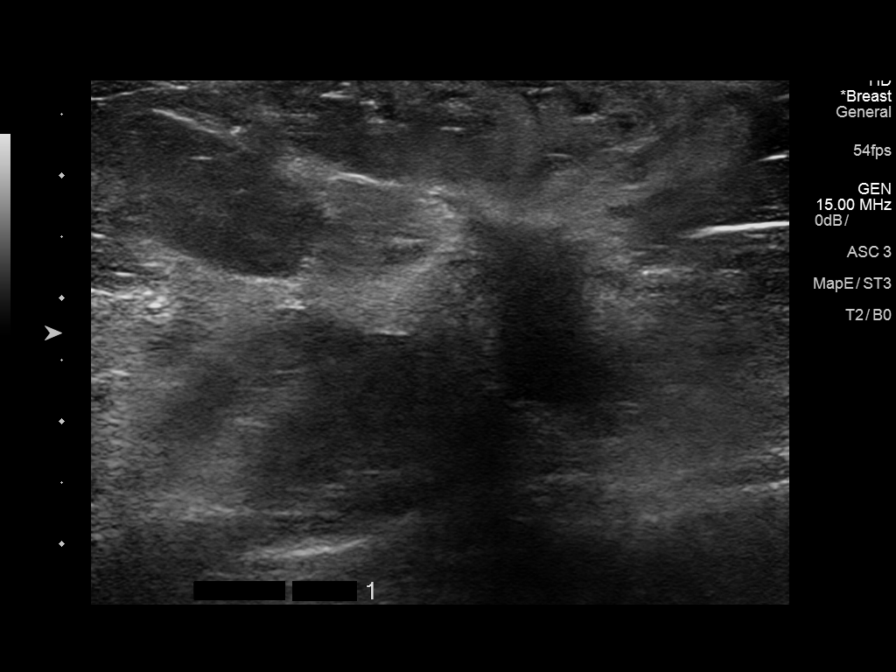
[im 2/14]
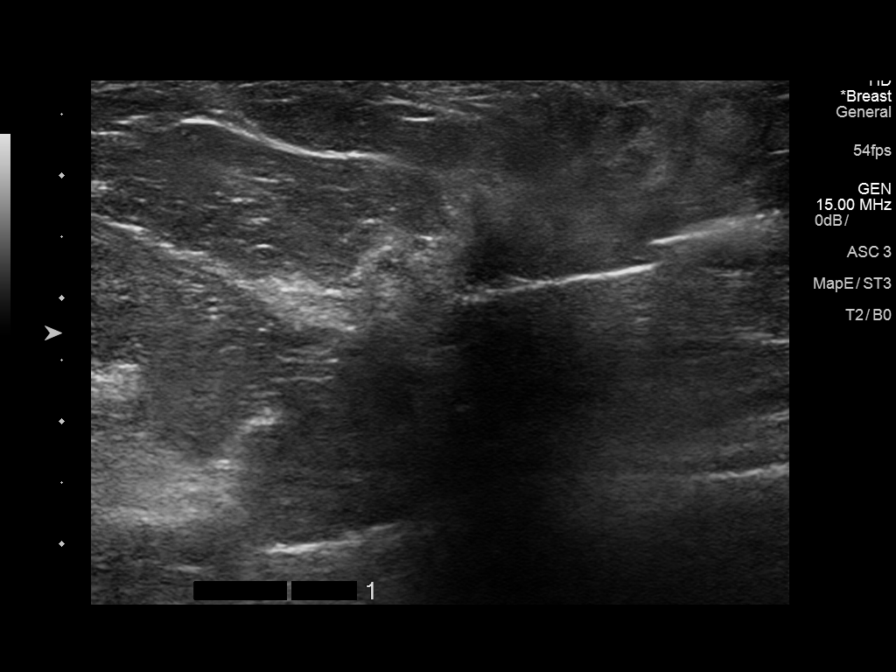
[im 4/14]
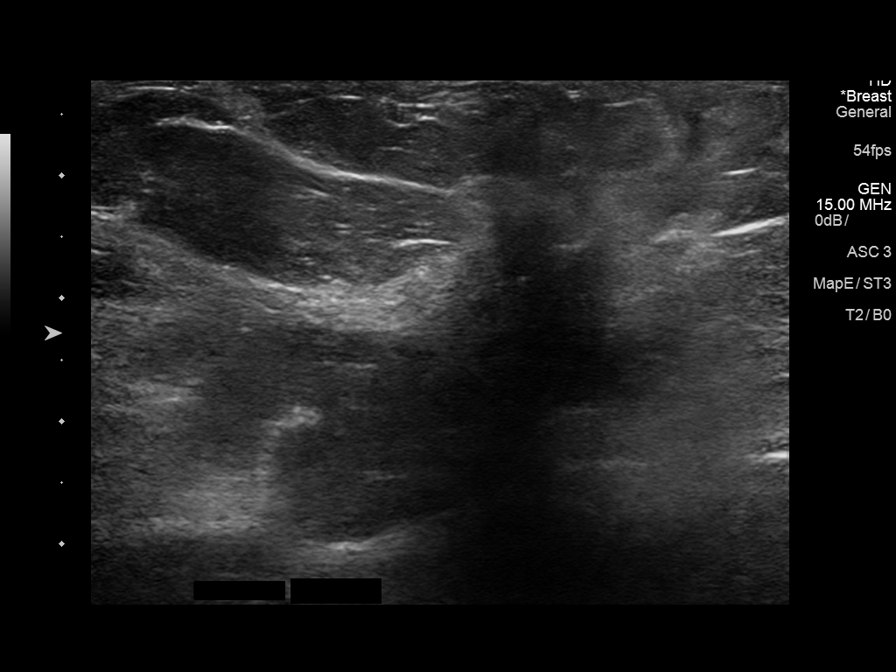
[im 6/14]
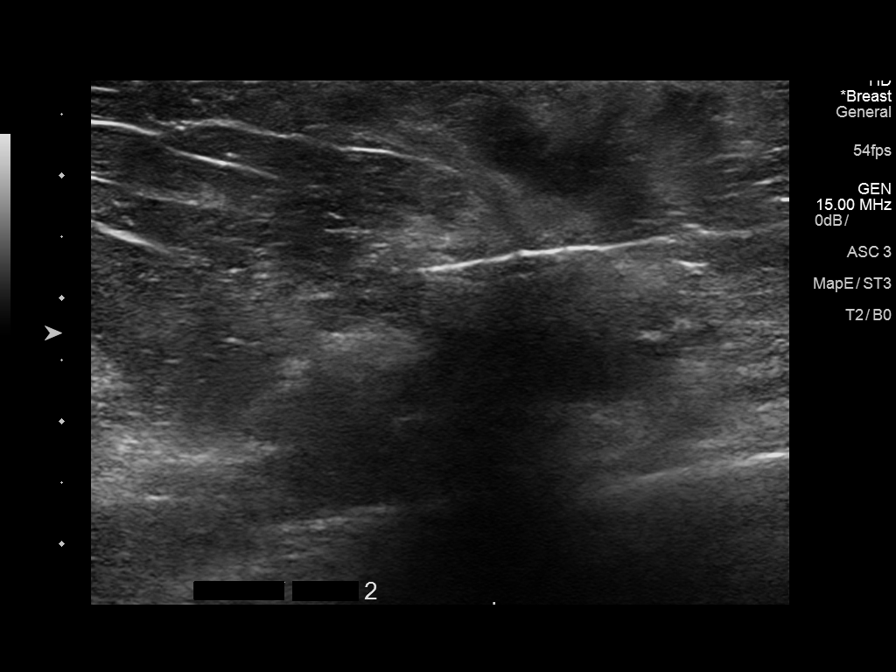
[im 8/14]
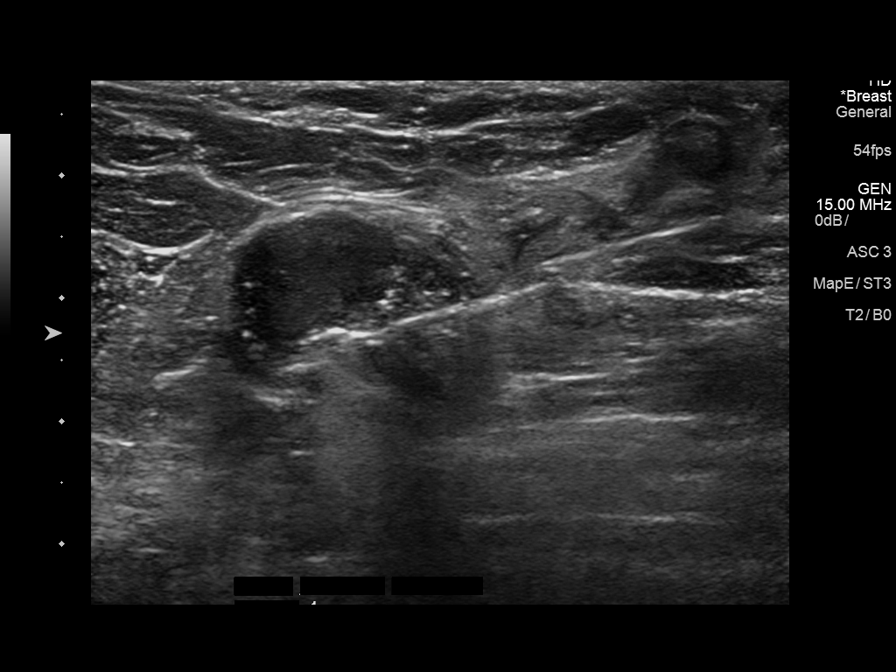
[im 10/14]
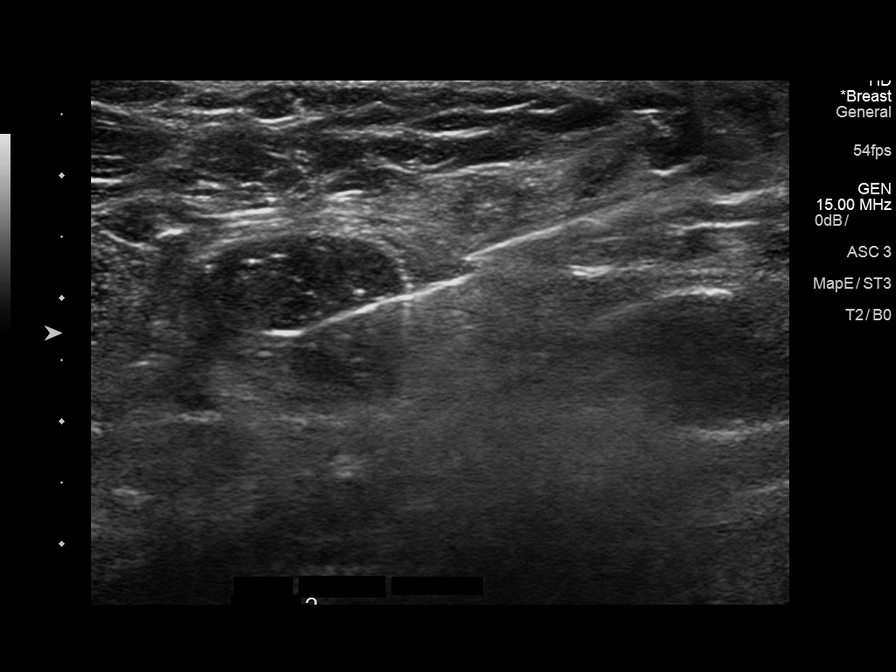
[im 12/14]
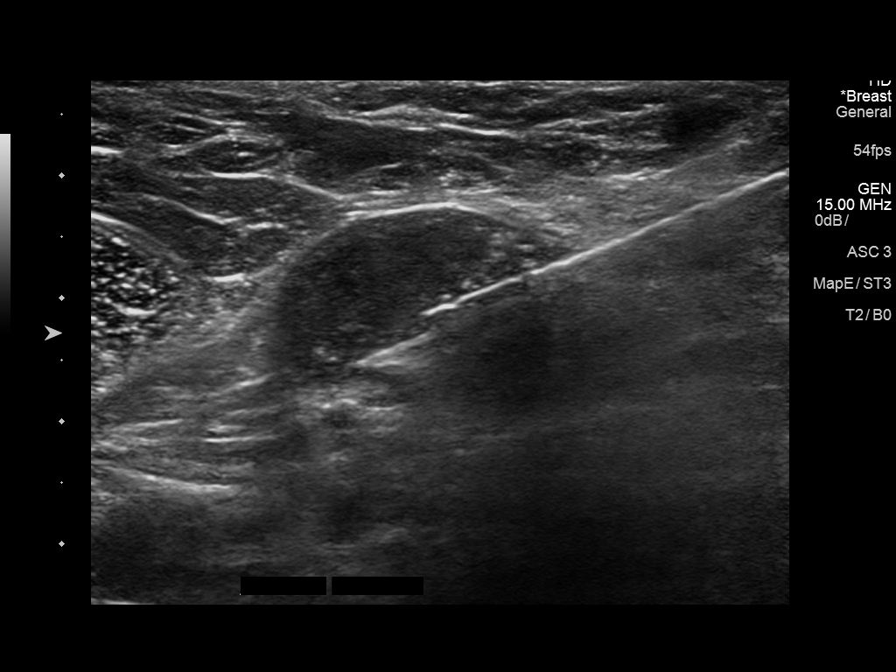
[im 14/14]
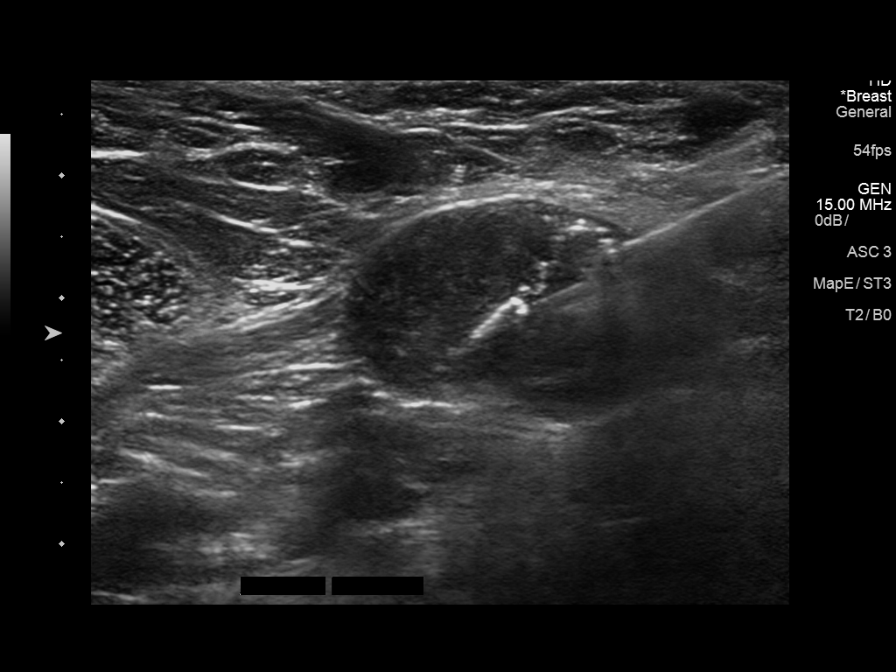

[8 of 8 positions shown; findings below may reference images not displayed]



Lesion quadrant: Upper outer quadrant

Using sterile technique and 1% Lidocaine as local anesthetic, under
direct ultrasound visualization, a 12 gauge Guzha device was
used to perform biopsy of a palpable irregular hypoechoic mass
centered at [DATE] position 5 cm from the nipple using a lateral
approach. At the conclusion of the procedure a coil tissue marker
clip was deployed into the biopsy cavity. Follow up 2 view mammogram
was performed and dictated separately.
IMPRESSION: Ultrasound guided biopsy of the left breast. No apparent
complications.

## 2020-01-08 IMAGING — MG MM BREAST LOCALIZATION CLIP
3 series · 3 of 3 positions shown · non-contrast
Comparison: Previous exam(s).

CLINICAL DATA: Ultrasound-guided biopsies of a palpable suspicious
left breast mass and an enlarged left axillary lymph node were
performed today.

EXAM:
DIAGNOSTIC LEFT MAMMOGRAM POST ULTRASOUND BIOPSIES

[L ML]
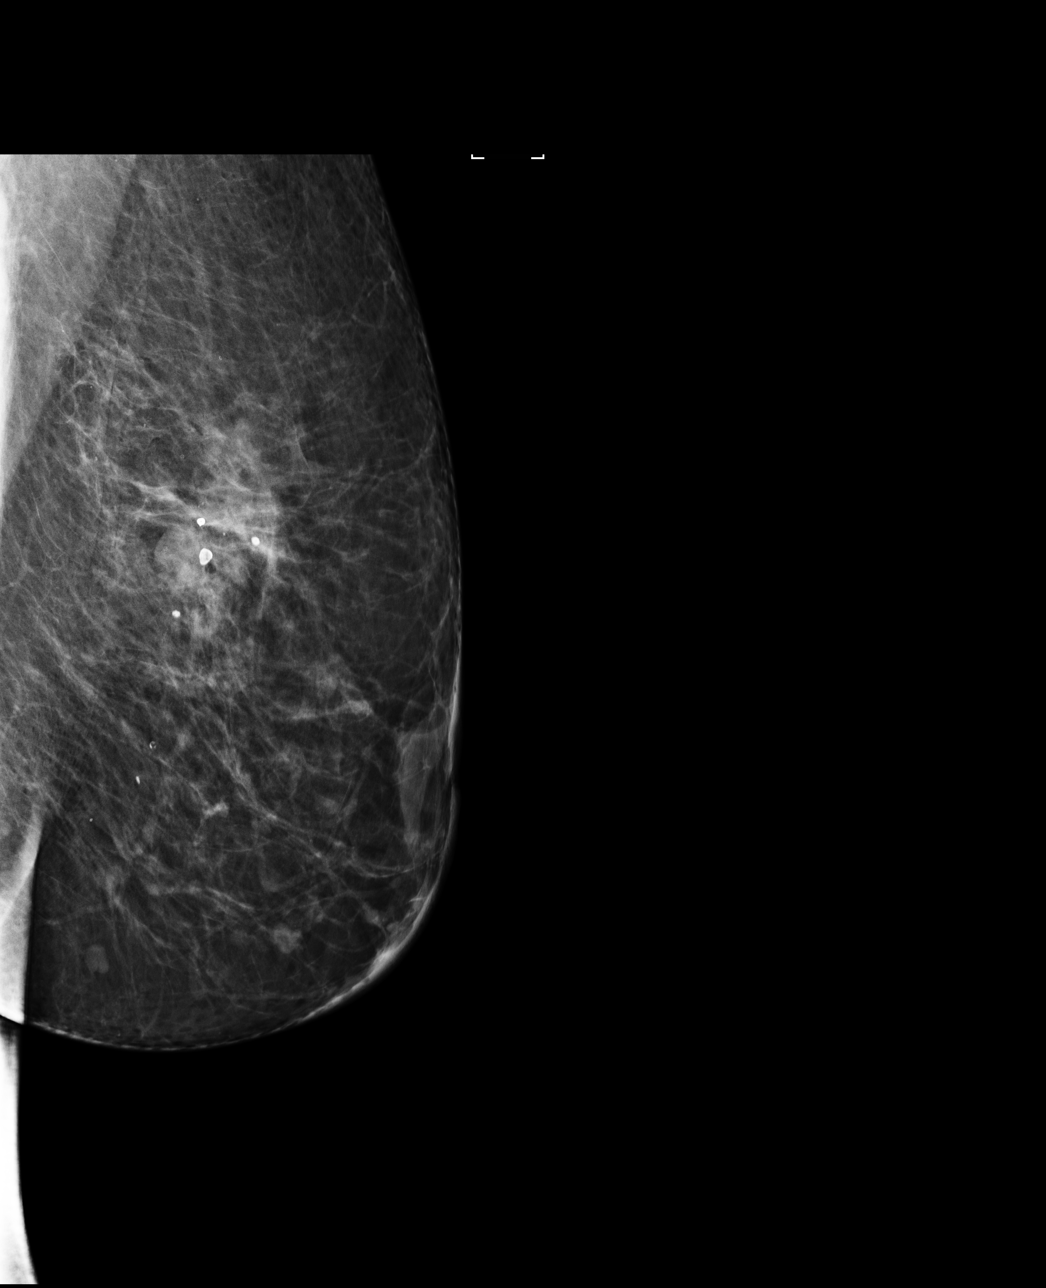

[L CC]
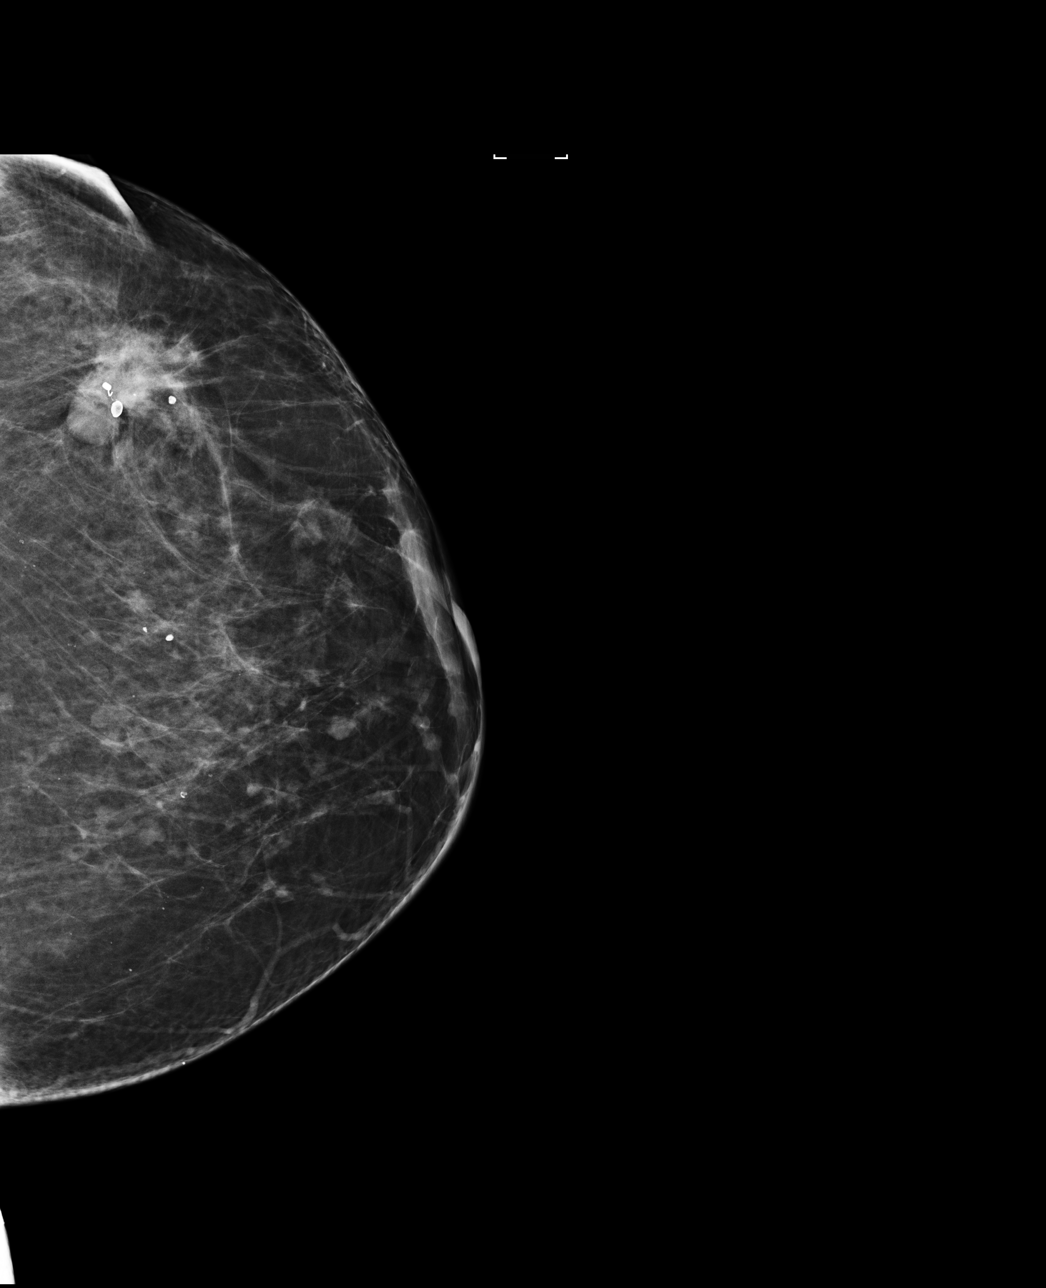

[L MLO]
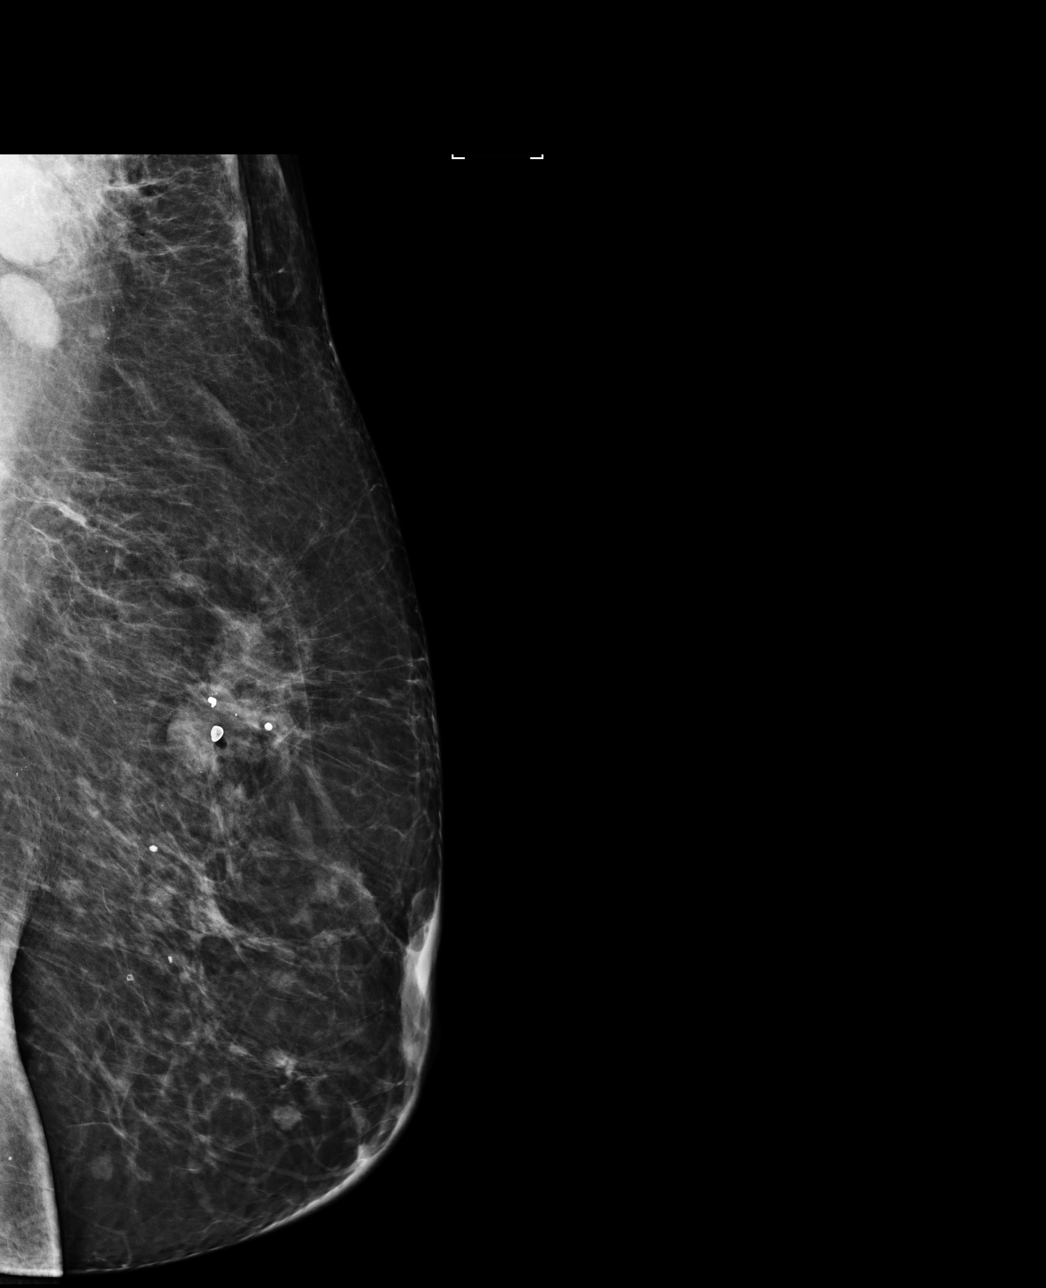

[3 of 3 positions shown; findings below may reference images not displayed]

FINDINGS: Mammographic images were obtained following ultrasound guided biopsy
of a left breast mass at 2 o'clock position and a suspicious
enlarged left axillary lymph node. Coil shaped biopsy clip is
satisfactorily positioned within the biopsied mass. Lidocaine/biopsy
changes are seen anterior to the enlarged biopsied lymph node, but
the biopsy clip is not included on the image. Based on the
ultrasound images at the end of the axillary lymph node biopsy, it
is felt that the biopsy clip is on the deep side of the biopsied
lymph node.
IMPRESSION: Satisfactory position of coil shaped biopsy clip in the left breast
mass. The biopsy clip placed within left axillary lymph node is not
visible on the mammogram.

Final Assessment: Post Procedure Mammograms for Marker Placement

## 2020-01-13 ENCOUNTER — Telehealth: Payer: Self-pay | Admitting: *Deleted

## 2020-01-13 DIAGNOSIS — I441 Atrioventricular block, second degree: Secondary | ICD-10-CM

## 2020-01-13 NOTE — Telephone Encounter (Signed)
-----   Message from Sondra Barges, PA-C sent at 01/12/2020  4:46 PM EST ----- Heart monitor showed a predominant rhythm of sinus with an average heart rate of 71 bpm (range 23-129 bpm in sinus). There were 12 atrial runs (fast heart beats from the top portion of the heart) lasting up to 12.8 seconds with a maximum rate of 162 bpm. Some episodes of Mobitz type I AV block (slow heart beats) were noted in the overnight and early morning hours. Rare extra beats from the top and bottom portions of the heart (very common(. Patient triggered events did not correspond to arrhythmia.   Recommendations: -Recommend referral to pulmonology for evaluation of sleep apnea given Mobitz type I noted in the overnight hours, if she is agreeable -Would defer starting any scheduled medication for runs of atrial tachycardia at this time given infrequency and short duration

## 2020-01-13 NOTE — Telephone Encounter (Signed)
Spoke with patient and reviewed results and recommendations. She verbalized understanding and requested pulmonary provider here in [place]. Advised that I would enter referral and someone would be in touch to assist with scheduling appointment. She had no further questions or concerns at this time.

## 2020-01-13 NOTE — Telephone Encounter (Signed)
Sleep consult scheduled for 02/02/20 at 2:00. Patient is aware and voiced her understanding.  Nothing further needed.

## 2020-01-17 NOTE — Progress Notes (Signed)
[place]  [phone] [phone]  ID: CLAUDE SWENDSEN OB: [birth year]  MR#: [id number]  KGY#:[id number]  Patient Care Team: [name], [name], Harrell Gave, [name] Rico Junker, RN as Registered Nurse Lloyd Huger, MD as Consulting Physician (Oncology) Bary Castilla Forest Gleason, MD as Consulting Physician (General Surgery) Bary Castilla Forest Gleason, MD as Consulting Physician (General Surgery) Noreene Filbert, MD as Referring Physician (Radiation Oncology) Jeral Fruit, RN as Registered Nurse   CHIEF COMPLAINT: Recurrent stage IV ER/PR positive, 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  INTERVAL HISTORY: Patient returns to clinic today for further evaluation and continuation of treatment.  She continues to tolerate Ibrance, Faslodex, and Xgeva well without significant side effects.  She currently feels well and is asymptomatic.  She does not complain of pain today.  She continues to have a mild peripheral neuropathy, but no other neurologic complaints.  She denies any chest pain, shortness of breath, cough, or hemoptysis.  She denies any nausea, vomiting, constipation, or diarrhea.  She has no urinary complaints.  Patient feels at her baseline offers no further specific complaints today.  REVIEW OF SYSTEMS:   Review of Systems  Constitutional: Negative for fever, malaise/fatigue and weight loss.  HENT: Negative for congestion.   Respiratory: Negative.  Negative for cough and shortness of breath.   Cardiovascular: Negative.  Negative for chest pain and leg swelling.  Gastrointestinal: Negative.  Negative for abdominal pain, constipation, diarrhea and nausea.  Genitourinary: Negative.  Negative for dysuria and urgency.  Musculoskeletal: Negative.  Negative for back pain and joint pain.  Skin: Negative.  Negative for rash.  Neurological: Positive for tingling  and sensory change. Negative for dizziness, focal weakness, weakness and headaches.  Psychiatric/Behavioral: The patient is nervous/anxious.     As per HPI. Otherwise, a complete review of systems is negative.  PAST MEDICAL HISTORY: Past Medical History:  Diagnosis Date  . Anxiety   . Cancer ([place]) 07/2017   left breast  . Depression   . Hypertension   . Hypothyroidism   . Personal history of chemotherapy 2019   LEFT mastectomy-chemo before  . Personal history of radiation therapy 03/2018   LEFT mastectomy  . Rapid heart rate   . Thyroid disease     PAST SURGICAL HISTORY: Past Surgical History:  Procedure Laterality Date  . AXILLARY LYMPH NODE BIOPSY Left 07/16/2017   METASTATIC MAMMARY CARCINOMA  . BREAST BIOPSY Left 07/16/2017   [place] bx of left breast mass and left breast LN.  INVASIVE MAMMARY CARCINOMA, NO SPECIAL TYPE.   Marland Kitchen BREAST EXCISIONAL BIOPSY Right 2001   benign  . BREAST LUMPECTOMY WITH SENTINEL LYMPH NODE BIOPSY Left 12/06/2017   Procedure: BREAST LUMPECTOMY WITH SENTINEL LYMPH NODE BX;  Surgeon: Robert Bellow, MD;  Location: [facility] ORS;  Service: General;  Laterality: Left;  . COLONOSCOPY    . MASTECTOMY Left 12/23/2017  . PORTACATH PLACEMENT Right 08/07/2017   Procedure: INSERTION PORT-A-CATH;  Surgeon: Robert Bellow, MD;  Location: [facility] ORS;  Service: General;  Laterality: Right;  . SIMPLE MASTECTOMY WITH AXILLARY SENTINEL NODE BIOPSY Left 12/23/2017   T2,N2 with 6/7 nodes positive. Whole breast radiation.  Surgeon: Robert Bellow, MD;  Location: [facility] ORS;  Service: General;  Laterality: Left;  . TONSILLECTOMY      FAMILY HISTORY: Family History  Problem Relation Age of Onset  . Stroke Mother   . Thyroid disease Mother   .  Renal Disease Mother   . Stroke Father   . Heart attack Father   . Sudden death Father 65       suicide  . Anuerysm Brother   . Breast cancer Neg Hx     ADVANCED DIRECTIVES (Y/N):  N  HEALTH MAINTENANCE: Social  History   Tobacco Use  . Smoking status: Former Smoker    Packs/day: 0.25    Years: 30.00    Pack years: 7.50    Types: Cigarettes    Quit date: 2019    Years since quitting: 3.0  . Smokeless tobacco: Never Used  Vaping Use  . Vaping Use: Never used  Substance Use Topics  . Alcohol use: Not Currently  . Drug use: Never     Colonoscopy:  PAP:  Bone density:  Lipid panel:  Allergies  Allergen Reactions  . Sulfa Antibiotics Diarrhea    Current Outpatient Medications  Medication Sig Dispense Refill  . acetaminophen (TYLENOL) 500 MG tablet Take 1,000 mg by mouth every 6 (six) hours as needed for moderate pain or headache.     . albuterol (PROVENTIL HFA;VENTOLIN HFA) 108 (90 Base) MCG/ACT inhaler Inhale 2 puffs into the lungs every 6 (six) hours as needed for wheezing or shortness of breath. 1 Inhaler 2  . ALPRAZolam (XANAX) 0.5 MG tablet Take 1 tablet (0.5 mg total) by mouth 2 (two) times daily. 60 tablet 0  . amLODipine (NORVASC) 5 MG tablet TAKE 1 TABLET BY MOUTH DAILY 90 tablet 3  . aspirin EC 81 MG tablet Take 81 mg by mouth at bedtime.     . calcium carbonate (OS-CAL - DOSED IN MG OF ELEMENTAL CALCIUM) 1250 (500 Ca) MG tablet Take 1 tablet by mouth daily with breakfast.    . calcium-vitamin D (OSCAL WITH D) 250-125 MG-UNIT tablet Take 1 tablet by mouth 2 (two) times daily.    . Cholecalciferol (VITAMIN D3) 125 MCG (5000 UT) CAPS Take 1 capsule by mouth 2 (two) times a day.    . docusate sodium (COLACE) 100 MG capsule Take 100 mg by mouth 3 (three) times daily as needed for mild constipation.    Marland Kitchen levothyroxine (SYNTHROID) 100 MCG tablet TAKE 1 TABLET BY MOUTH DAILY 90 tablet 3  . losartan-hydrochlorothiazide (HYZAAR) 100-12.5 MG tablet TAKE 1 TABLET BY MOUTH DAILY 30 tablet 6  . ondansetron (ZOFRAN) 8 MG tablet Take 1 tablet (8 mg total) by mouth every 8 (eight) hours as needed for nausea or vomiting. 20 tablet 0  . palbociclib (IBRANCE) 125 MG tablet Take 1 tablet (125 mg  total) by mouth daily. Take for 21 days on, 7 days off, repeat every 28 days. 21 tablet 3  . rosuvastatin (CRESTOR) 5 MG tablet TAKE 1 TABLET BY MOUTH DAILY 30 tablet 6  . traZODone (DESYREL) 50 MG tablet TAKE 1 TABLET BY MOUTH DAILY 30 tablet 6   No current facility-administered medications for this visit.    OBJECTIVE: Vitals:   01/21/20 1431  BP: 128/68  Pulse: 65  Temp: (!) 97 F (36.1 C)  SpO2: 99%     Body mass index is 36.32 kg/m.    ECOG FS:0 - Asymptomatic  General: Well-developed, well-nourished, no acute distress. Eyes: Pink conjunctiva, anicteric sclera. HEENT: Normocephalic, moist mucous membranes. Lungs: No audible wheezing or coughing. Heart: Regular rate and rhythm. Abdomen: Soft, nontender, no obvious distention. Musculoskeletal: No edema, cyanosis, or clubbing. Neuro: Alert, answering all questions appropriately. Cranial nerves grossly intact. Skin: No rashes or petechiae noted.  Psych: Normal affect.   LAB RESULTS:  Lab Results  Component Value Date   NA 135 01/21/2020   K 3.7 01/21/2020   CL 101 01/21/2020   CO2 26 01/21/2020   GLUCOSE 103 (H) 01/21/2020   BUN 19 01/21/2020   CREATININE 1.19 (H) 01/21/2020   CALCIUM 9.2 01/21/2020   PROT 7.4 01/21/2020   ALBUMIN 4.4 01/21/2020   AST 21 01/21/2020   ALT 17 01/21/2020   ALKPHOS 70 01/21/2020   BILITOT 0.9 01/21/2020   GFRNONAA 49 (L) 01/21/2020   GFRAA 57 (L) 10/01/2019    Lab Results  Component Value Date   WBC 2.3 (L) 01/21/2020   NEUTROABS 1.3 (L) 01/21/2020   HGB 11.9 (L) 01/21/2020   HCT 33.5 (L) 01/21/2020   MCV 99.1 01/21/2020   PLT 150 01/21/2020     STUDIES: No results found. ONCOLOGY HISTORY: Patient initially received neoadjuvant chemotherapy with Adriamycin and Cytoxan followed by weekly Taxol. She only received 4 cycles of weekly Taxol prior to discontinuation of treatment on October 31, 2017 secondary to persistent peripheral neuropathy.  She ultimately required  mastectomy and final pathology noted 5 of 6 lymph nodes positive for disease.  Patient completed adjuvant XRT in mid April 2020.  Pain initiated letrozole in May 2020, this was discontinued secondary to side effects and patient was started on anastrozole.  Nuclear medicine bone scan on June 19, 2019 revealed metastatic disease and anastrozole was subsequently discontinued.  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  PLAN:    1. 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  MRI of the brain on August 24, 2019 reviewed independently with no obvious evidence of metastatic disease.  Patient's CA 27-29 initially increased to 141.9, but no continues to trend down and is 48.5.  Continue 125 mg Ibrance for 21 days with 7 days off.  Proceed with Faslodex and Xgeva today.  Return to clinic in 4 weeks for further evaluation and continuation of treatment.  Appreciate clinical pharmacy input. 2.  Peripheral neuropathy: Chronic and unchanged. 3.  Pulmonary embolus: Patient was diagnosed with a small pulmonary embolus on January 10, 2018.  She is no longer on anticoagulation.  There was no evidence of recurrent PE on CT scan from October 27, 2019. 4.  Osteopenia: Patient's most recent bone mineral density on March 03, 2019 reported a T score of -1.8 which is only mildly decreased from 1 year prior when the reported T score was -1.6.  Continue calcium and vitamin D supplementation.  Xgeva as above. 5.  Left hip pain: Resolved.  Secondary to metastatic disease. Patient has completed XRT. 6.  Leukopenia: Chronic and unchange resolved. 7.  Anemia: Chronic and unchanged.  Monitor.    Patient expressed understanding and was in agreement with this plan. She also understands that She  can call clinic at any time with any questions, concerns, or complaints.   Cancer Staging Breast cancer, stage 2, left ([place]) Staging form: Breast, AJCC 8th Edition - Clinical stage from 07/30/2017: Stage IIA (cT2, cN1, cM0, G2, ER+, PR+, HER2-) - Signed by Lloyd Huger, MD on 07/30/2017   Lloyd Huger, MD   01/23/2020 9:58 AM

## 2020-01-20 ENCOUNTER — Other Ambulatory Visit: Payer: Self-pay | Admitting: Internal Medicine

## 2020-01-20 ENCOUNTER — Telehealth: Payer: Self-pay | Admitting: Pharmacy Technician

## 2020-01-21 ENCOUNTER — Inpatient Hospital Stay: Payer: Medicare Other

## 2020-01-21 ENCOUNTER — Other Ambulatory Visit: Payer: Self-pay

## 2020-01-21 ENCOUNTER — Inpatient Hospital Stay (HOSPITAL_BASED_OUTPATIENT_CLINIC_OR_DEPARTMENT_OTHER): Payer: Medicare Other | Admitting: Oncology

## 2020-01-21 ENCOUNTER — Inpatient Hospital Stay: Payer: Medicare Other | Admitting: Pharmacist

## 2020-01-21 ENCOUNTER — Inpatient Hospital Stay: Payer: Medicare Other | Attending: Oncology

## 2020-01-21 VITALS — BP 128/68 | HR 65 | Temp 97.0°F | Wt 225.0 lb

## 2020-01-21 DIAGNOSIS — C50919 Malignant neoplasm of unspecified site of unspecified female breast: Secondary | ICD-10-CM

## 2020-01-21 DIAGNOSIS — Z5111 Encounter for antineoplastic chemotherapy: Secondary | ICD-10-CM | POA: Insufficient documentation

## 2020-01-21 DIAGNOSIS — C50912 Malignant neoplasm of unspecified site of left female breast: Secondary | ICD-10-CM

## 2020-01-21 DIAGNOSIS — C7951 Secondary malignant neoplasm of bone: Secondary | ICD-10-CM | POA: Diagnosis not present

## 2020-01-21 DIAGNOSIS — Z79899 Other long term (current) drug therapy: Secondary | ICD-10-CM | POA: Insufficient documentation

## 2020-01-21 LAB — COMPREHENSIVE METABOLIC PANEL
ALT: 17 U/L (ref 0–44)
AST: 21 U/L (ref 15–41)
Albumin: 4.4 g/dL (ref 3.5–5.0)
Alkaline Phosphatase: 70 U/L (ref 38–126)
Anion gap: 8 (ref 5–15)
BUN: 19 mg/dL (ref 8–23)
CO2: 26 mmol/L (ref 22–32)
Calcium: 9.2 mg/dL (ref 8.9–10.3)
Chloride: 101 mmol/L (ref 98–111)
Creatinine, Ser: 1.19 mg/dL — ABNORMAL HIGH (ref 0.44–1.00)
GFR, Estimated: 49 mL/min — ABNORMAL LOW (ref >60)
Glucose, Bld: 103 mg/dL — ABNORMAL HIGH (ref 70–99)
Potassium: 3.7 mmol/L (ref 3.5–5.1)
Sodium: 135 mmol/L (ref 135–145)
Total Bilirubin: 0.9 mg/dL (ref 0.3–1.2)
Total Protein: 7.4 g/dL (ref 6.5–8.1)

## 2020-01-21 LAB — CBC WITH DIFFERENTIAL/PLATELET
Abs Immature Granulocytes: 0.02 10*3/uL (ref 0.00–0.07)
Basophils Absolute: 0 10*3/uL (ref 0.0–0.1)
Basophils Relative: 1 %
Eosinophils Absolute: 0 10*3/uL (ref 0.0–0.5)
Eosinophils Relative: 2 %
HCT: 33.5 % — ABNORMAL LOW (ref 36.0–46.0)
Hemoglobin: 11.9 g/dL — ABNORMAL LOW (ref 12.0–15.0)
Immature Granulocytes: 1 %
Lymphocytes Relative: 21 %
Lymphs Abs: 0.5 10*3/uL — ABNORMAL LOW (ref 0.7–4.0)
MCH: 35.2 pg — ABNORMAL HIGH (ref 26.0–34.0)
MCHC: 35.5 g/dL (ref 30.0–36.0)
MCV: 99.1 fL (ref 80.0–100.0)
Monocytes Absolute: 0.4 10*3/uL (ref 0.1–1.0)
Monocytes Relative: 18 %
Neutro Abs: 1.3 10*3/uL — ABNORMAL LOW (ref 1.7–7.7)
Neutrophils Relative %: 57 %
Platelets: 150 10*3/uL (ref 150–400)
RBC: 3.38 MIL/uL — ABNORMAL LOW (ref 3.87–5.11)
RDW: 13.9 % (ref 11.5–15.5)
WBC: 2.3 10*3/uL — ABNORMAL LOW (ref 4.0–10.5)
nRBC: 0 % (ref 0.0–0.2)

## 2020-01-21 MED ORDER — DENOSUMAB 120 MG/1.7ML ~~LOC~~ SOLN
120.0000 mg | Freq: Once | SUBCUTANEOUS | Status: AC
  Administered 2020-01-21: 120 mg via SUBCUTANEOUS
  Filled 2020-01-21: qty 1.7

## 2020-01-21 MED ORDER — FULVESTRANT 250 MG/5ML IM SOLN
500.0000 mg | Freq: Once | INTRAMUSCULAR | Status: AC
  Administered 2020-01-21: 500 mg via INTRAMUSCULAR
  Filled 2020-01-21: qty 10

## 2020-01-21 NOTE — Progress Notes (Signed)
[place]  [phone]) [phone] [phone]  Patient Care Team: [name], [name], Harrell Gave, [name] Rico Junker, RN as Registered Nurse Grayland Ormond Kathlene November, MD as Consulting Physician (Oncology) Bary Castilla Forest Gleason, MD as Consulting Physician (General Surgery) Bary Castilla Forest Gleason, MD as Consulting Physician (General Surgery) Noreene Filbert, MD as Referring Physician (Radiation Oncology) Jeral Fruit, RN as Registered Nurse   Name of the patient: Alicia [name]  [id number]  [birth year]   Date of visit: 01/21/20  HPI: Patient is a 70 y.o. female with recurrent stage IV ER/PR positive, HER2 negative breast cancer. Patient started on Ibrance on 07/09/19.  Reason for Consult: Oral chemotherapy follow-up for Ibrance (palbociclib) therapy, s/p cycle 7.   PAST MEDICAL HISTORY: Past Medical History:  Diagnosis Date  . Anxiety   . Cancer ([place]) 07/2017   left breast  . Depression   . Hypertension   . Hypothyroidism   . Personal history of chemotherapy 2019   LEFT mastectomy-chemo before  . Personal history of radiation therapy 03/2018   LEFT mastectomy  . Rapid heart rate   . Thyroid disease     PAST SURGICAL HISTORY:  Past Surgical History:  Procedure Laterality Date  . AXILLARY LYMPH NODE BIOPSY Left 07/16/2017   METASTATIC MAMMARY CARCINOMA  . BREAST BIOPSY Left 07/16/2017   [place] bx of left breast mass and left breast LN.  INVASIVE MAMMARY CARCINOMA, NO SPECIAL TYPE.   Marland Kitchen BREAST EXCISIONAL BIOPSY Right 2001   benign  . BREAST LUMPECTOMY WITH SENTINEL LYMPH NODE BIOPSY Left 12/06/2017   Procedure: BREAST LUMPECTOMY WITH SENTINEL LYMPH NODE BX;  Surgeon: Robert Bellow, MD;  Location: [facility] ORS;  Service: General;  Laterality: Left;  . COLONOSCOPY    . MASTECTOMY Left 12/23/2017  . PORTACATH PLACEMENT Right 08/07/2017   Procedure:  INSERTION PORT-A-CATH;  Surgeon: Robert Bellow, MD;  Location: [facility] ORS;  Service: General;  Laterality: Right;  . SIMPLE MASTECTOMY WITH AXILLARY SENTINEL NODE BIOPSY Left 12/23/2017   T2,N2 with 6/7 nodes positive. Whole breast radiation.  Surgeon: Robert Bellow, MD;  Location: [facility] ORS;  Service: General;  Laterality: Left;  . TONSILLECTOMY      HEMATOLOGY/ONCOLOGY HISTORY:  Oncology History Overview Note  Patient is a 70 year old female who recently self palpated a mass on her left breast.  Subsequent imaging and biopsy revealed the above-stated breast cancer.  Case was also discussed extensively at case conference.  Given the size and the stage of patient's malignancy, she will benefit from neoadjuvant chemotherapy using Adriamycin, Cytoxan, and Taxol.  Patient will also require Neulasta support.  Will get CT scan of the chest, abdomen, and pelvis to assess for any metastatic disease.  Patient will also require port placement and MUGA prior to initiating treatment  CT abdomen/pelvis/chest did not reveal any suspicious lesions concerning for metastatic disease. (08/01/17)  Port-A-Cath placed on 08/07/2017.  Cycle 1 day 1 of AC was given on 08/08/17.     Breast cancer, stage 2, left ([place])  07/24/2017 Initial Diagnosis   Breast cancer, stage 2, left ([place])   07/30/2017 Cancer Staging   Staging form: Breast, AJCC 8th Edition - Clinical stage from 07/30/2017: Stage IIA (cT2, cN1, cM0, G2, ER+, PR+, HER2-) - Signed by Lloyd Huger, MD on 07/30/2017   08/08/2017 - 10/31/2017 Chemotherapy   The patient had DOXOrubicin (ADRIAMYCIN) chemo injection 130 mg, 60 mg/m2 = 130  mg, Intravenous,  Once, 4 of 4 cycles Administration: 130 mg (08/08/2017), 130 mg (08/22/2017), 130 mg (09/05/2017), 130 mg (09/19/2017) palonosetron (ALOXI) injection 0.25 mg, 0.25 mg, Intravenous,  Once, 4 of 4 cycles Administration: 0.25 mg (08/08/2017), 0.25 mg (08/22/2017), 0.25 mg (09/05/2017), 0.25 mg  (09/19/2017) pegfilgrastim-cbqv (UDENYCA) injection 6 mg, 6 mg, Subcutaneous, Once, 4 of 4 cycles Administration: 6 mg (08/09/2017), 6 mg (08/23/2017), 6 mg (09/06/2017), 6 mg (09/20/2017) cyclophosphamide (CYTOXAN) 1,300 mg in sodium chloride 0.9 % 250 mL chemo infusion, 600 mg/m2 = 1,300 mg, Intravenous,  Once, 4 of 4 cycles Administration: 1,300 mg (08/08/2017), 1,300 mg (08/22/2017), 1,300 mg (09/05/2017), 1,300 mg (09/19/2017) PACLitaxel (TAXOL) 174 mg in sodium chloride 0.9 % 250 mL chemo infusion (</= 72m/m2), 80 mg/m2 = 174 mg, Intravenous,  Once, 4 of 12 cycles Dose modification: 72 mg/m2 (original dose 80 mg/m2, Cycle 6, Reason: Dose not tolerated) Administration: 174 mg (10/03/2017), 156 mg (10/17/2017), 156 mg (10/24/2017), 156 mg (10/31/2017) fosaprepitant (EMEND) 150 mg, dexamethasone (DECADRON) 12 mg in sodium chloride 0.9 % 145 mL IVPB, , Intravenous,  Once, 4 of 4 cycles Administration:  (08/08/2017),  (08/22/2017),  (09/05/2017),  (09/19/2017)  for chemotherapy treatment.      ALLERGIES:  is allergic to sulfa antibiotics.  MEDICATIONS:  Current Outpatient Medications  Medication Sig Dispense Refill  . acetaminophen (TYLENOL) 500 MG tablet Take 1,000 mg by mouth every 6 (six) hours as needed for moderate pain or headache.     . albuterol (PROVENTIL HFA;VENTOLIN HFA) 108 (90 Base) MCG/ACT inhaler Inhale 2 puffs into the lungs every 6 (six) hours as needed for wheezing or shortness of breath. 1 Inhaler 2  . ALPRAZolam (XANAX) 0.5 MG tablet Take 1 tablet (0.5 mg total) by mouth 2 (two) times daily. 60 tablet 0  . amLODipine (NORVASC) 5 MG tablet TAKE 1 TABLET BY MOUTH DAILY 90 tablet 3  . aspirin EC 81 MG tablet Take 81 mg by mouth at bedtime.     . calcium carbonate (OS-CAL - DOSED IN MG OF ELEMENTAL CALCIUM) 1250 (500 Ca) MG tablet Take 1 tablet by mouth daily with breakfast.    . calcium-vitamin D (OSCAL WITH D) 250-125 MG-UNIT tablet Take 1 tablet by mouth 2 (two) times daily.    .  Cholecalciferol (VITAMIN D3) 125 MCG (5000 UT) CAPS Take 1 capsule by mouth 2 (two) times a day.    . docusate sodium (COLACE) 100 MG capsule Take 100 mg by mouth 3 (three) times daily as needed for mild constipation.    .Marland Kitchenlevothyroxine (SYNTHROID) 100 MCG tablet TAKE 1 TABLET BY MOUTH DAILY 90 tablet 3  . losartan-hydrochlorothiazide (HYZAAR) 100-12.5 MG tablet TAKE 1 TABLET BY MOUTH DAILY 30 tablet 6  . ondansetron (ZOFRAN) 8 MG tablet Take 1 tablet (8 mg total) by mouth every 8 (eight) hours as needed for nausea or vomiting. 20 tablet 0  . palbociclib (IBRANCE) 125 MG tablet Take 1 tablet (125 mg total) by mouth daily. Take for 21 days on, 7 days off, repeat every 28 days. 21 tablet 3  . rosuvastatin (CRESTOR) 5 MG tablet TAKE 1 TABLET BY MOUTH DAILY 30 tablet 6  . traZODone (DESYREL) 50 MG tablet TAKE 1 TABLET BY MOUTH DAILY 30 tablet 6   No current facility-administered medications for this visit.   Facility-Administered Medications Ordered in Other Visits  Medication Dose Route Frequency Provider Last Rate Last Admin  . fulvestrant (FASLODEX) injection 500 mg  500 mg Intramuscular Once Finnegan, Timothy J,  MD        VITAL SIGNS: There were no vitals taken for this visit. There were no vitals filed for this visit.  Estimated body mass index is 36.32 kg/m as calculated from the following:   Height as of 12/28/19: _0  (1.676 m).   Weight as of an earlier encounter on 01/21/20: 102.1 kg (225 lb).  LABS: CBC:    Component Value Date/Time   WBC 2.3 (L) 01/21/2020 1402   HGB 11.9 (L) 01/21/2020 1402   HGB 14.2 02/11/2011 1349   HCT 33.5 (L) 01/21/2020 1402   HCT 40.9 02/11/2011 1349   PLT 150 01/21/2020 1402   PLT 151 02/11/2011 1349   MCV 99.1 01/21/2020 1402   MCV 89 02/11/2011 1349   NEUTROABS 1.3 (L) 01/21/2020 1402   LYMPHSABS 0.5 (L) 01/21/2020 1402   MONOABS 0.4 01/21/2020 1402   EOSABS 0.0 01/21/2020 1402   BASOSABS 0.0 01/21/2020 1402   Comprehensive Metabolic  Panel:    Component Value Date/Time   NA 135 01/21/2020 1402   NA 142 02/11/2011 1349   K 3.7 01/21/2020 1402   K 3.8 02/11/2011 1349   CL 101 01/21/2020 1402   CL 107 02/11/2011 1349   CO2 26 01/21/2020 1402   CO2 24 02/11/2011 1349   BUN 19 01/21/2020 1402   BUN 14 02/11/2011 1349   CREATININE 1.19 (H) 01/21/2020 1402   CREATININE 0.65 02/11/2011 1349   GLUCOSE 103 (H) 01/21/2020 1402   GLUCOSE 98 02/11/2011 1349   CALCIUM 9.2 01/21/2020 1402   CALCIUM 9.0 02/11/2011 1349   AST 21 01/21/2020 1402   AST 27 02/11/2011 1349   ALT 17 01/21/2020 1402   ALT 36 02/11/2011 1349   ALKPHOS 70 01/21/2020 1402   ALKPHOS 122 02/11/2011 1349   BILITOT 0.9 01/21/2020 1402   BILITOT 0.5 02/11/2011 1349   PROT 7.4 01/21/2020 1402   PROT 7.0 02/11/2011 1349   ALBUMIN 4.4 01/21/2020 1402   ALBUMIN 4.1 02/11/2011 1349    RADIOGRAPHIC STUDIES: No results found.   Assessment and Plan-  Reviewed CBC and will plan to continue Ibrance at 171m dosing for cycle 8.    Oral Chemotherapy Side Effect/Intolerance:  - Constipation: her constipation has improved greatly since the last visit. She notes she has not needed to try the Senna and that Miralax and Colace have been helpful.  - She notes she has been sleeping better lately   Oral Chemotherapy Adherence: No reported missed doses  Medication Access Issues: Ms. HWestley Chandlerhas enough medication on hand for this next cycle that she is starting. Manufacturer assistance is still pending through [employer] 2022. She has two grants, PAF and [employer], that can [place] [facility] [place]. She is aware to contact the clinic if she doesn't hear [place].   Patient expressed understanding and was in agreement with this plan. She also understands that She can call clinic at any time with any questions, concerns, or complaints.   Thank you for allowing me to  participate in the care of this very pleasant patient.   Time Total: 15 mins  Visit consisted of counseling and education on dealing with issues of symptom management in the setting of serious and potentially life-threatening illness.Greater than 50%  of this time was spent counseling and coordinating care related to the above assessment and plan.  Signed by: ADarl Pikes PharmD, BCPS, BCOP, CPP  Hematology/Oncology Clinical Pharmacist Practitioner [facility] [phone]  01/21/2020 3:33 PM

## 2020-01-21 NOTE — Progress Notes (Unsigned)
Patient here today for follow up regarding breast cancer. Patient denies concerns today.  

## 2020-01-23 LAB — CANCER ANTIGEN 27.29: CA 27.29: 48.5 U/mL — ABNORMAL HIGH (ref 0.0–38.6)

## 2020-01-28 NOTE — Telephone Encounter (Signed)
Oral Oncology Patient Advocate Encounter   Was successful in securing patient a $4000 grant from [place] to provide copayment coverage for Ibrance.  This will keep the out of pocket expense at $0.    The billing information is as follows and has been shared with [place].   Member ID: [id number] Group ID: [id number] RxBin: [id number] PCN: [id number] Dates of Eligibility: 01/20/20 through 01/19/21  Fund name:  Breast [place] Patient [place] Phone [phone] Fax [phone] 01/28/2020 12:09 PM

## 2020-02-01 ENCOUNTER — Ambulatory Visit: Payer: Medicare Other | Admitting: Internal Medicine

## 2020-02-02 ENCOUNTER — Institutional Professional Consult (permissible substitution): Payer: Medicare Other | Admitting: Pulmonary Disease

## 2020-02-02 NOTE — Telephone Encounter (Signed)
Received a fax from [employer] that the patient qualifies for [place] and must apply to be considered for assistance.  If patient is denied, [employer] requests that the denial letter be faxed to them and they will reconsider her for assistance.    She currently has a grant to cover her copay.  I will call the patient to have her apply for LIS as this can help reduce her copay on all medications.  LIS can take up to 3 to 4 months to make a decision.  [place] Patient [place] Phone [phone] Fax [phone] 02/02/2020 2:34 PM

## 2020-02-03 NOTE — Telephone Encounter (Signed)
Called and spoke with patient.  We are going to complete the Medicare Extra Help application online at her appt on 2/3.  Informed patient of [place] we obtained to get her medication.    Since the extra help application can take up to 3 months for a decision, patient can obtain medication through the [facility] with her grant.    I told patient that we would schedule her medication for delivery at her appointment also.  [place] Patient [place] Phone [phone] Fax [phone] 02/03/2020 2:07 PM

## 2020-02-04 ENCOUNTER — Ambulatory Visit (INDEPENDENT_AMBULATORY_CARE_PROVIDER_SITE_OTHER): Payer: Medicare Other | Admitting: Family Medicine

## 2020-02-04 ENCOUNTER — Other Ambulatory Visit: Payer: Self-pay

## 2020-02-04 ENCOUNTER — Encounter: Payer: Self-pay | Admitting: Family Medicine

## 2020-02-04 DIAGNOSIS — F419 Anxiety disorder, unspecified: Secondary | ICD-10-CM | POA: Diagnosis not present

## 2020-02-04 MED ORDER — HYDROCHLOROTHIAZIDE 12.5 MG PO TABS: 12.5000 mg | ORAL_TABLET | Freq: Every day | ORAL | 3 refills | Status: DC

## 2020-02-04 MED ORDER — ALPRAZOLAM 0.5 MG PO TABS: 0.5000 mg | ORAL_TABLET | Freq: Two times a day (BID) | ORAL | 0 refills | Status: DC

## 2020-02-04 MED ORDER — LOSARTAN POTASSIUM 100 MG PO TABS: 100.0000 mg | ORAL_TABLET | Freq: Every day | ORAL | 3 refills | Status: DC

## 2020-02-05 NOTE — Telephone Encounter (Signed)
Oral Oncology Patient Advocate Encounter   Was successful in securing patient a $33 grant (with max funding of $16,000 if funding available) from [facility] [place] (PAF) to provide copayment coverage for Ibrance.  This will keep the out of pocket expense at $0.     I have spoken with the patient.    The billing information is as follows and has been shared with [place]. RxBin: [id number] PCN:  [id number] Member ID: [id number] Group ID: [id number] Dates of Eligibility: 01/20/20 through 01/19/21  Fund: [place] Patient [place] Phone [phone] Fax [phone] 02/05/2020 9:57 AM

## 2020-02-05 NOTE — Assessment & Plan Note (Signed)
Patient in today with need for anxiety medication refill. Anxiety has been well controlled on current meds, no feelings of being down depressed or hopeless.

## 2020-02-05 NOTE — Progress Notes (Signed)
Established Patient Office Visit  SUBJECTIVE:  Subjective  Patient ID: Alicia [name], female    DOB: [birth year]  Age: 70 y.o. MRN: [id number]  CC:  Chief Complaint  Patient presents with  . Anxiety    Patient needs refill on xanax   . Hypertension    HPI Alicia [name] is a 70 y.o. female presenting today for     Past Medical History:  Diagnosis Date  . Anxiety   . Cancer ([place]) 07/2017   left breast  . Depression   . Hypertension   . Hypothyroidism   . Personal history of chemotherapy 2019   LEFT mastectomy-chemo before  . Personal history of radiation therapy 03/2018   LEFT mastectomy  . Rapid heart rate   . Thyroid disease     Past Surgical History:  Procedure Laterality Date  . AXILLARY LYMPH NODE BIOPSY Left 07/16/2017   METASTATIC MAMMARY CARCINOMA  . BREAST BIOPSY Left 07/16/2017   [place] bx of left breast mass and left breast LN.  INVASIVE MAMMARY CARCINOMA, NO SPECIAL TYPE.   Marland Kitchen BREAST EXCISIONAL BIOPSY Right 2001   benign  . BREAST LUMPECTOMY WITH SENTINEL LYMPH NODE BIOPSY Left 12/06/2017   Procedure: BREAST LUMPECTOMY WITH SENTINEL LYMPH NODE BX;  Surgeon: Robert Bellow, MD;  Location: [facility] ORS;  Service: General;  Laterality: Left;  . COLONOSCOPY    . MASTECTOMY Left 12/23/2017  . PORTACATH PLACEMENT Right 08/07/2017   Procedure: INSERTION PORT-A-CATH;  Surgeon: Robert Bellow, MD;  Location: [facility] ORS;  Service: General;  Laterality: Right;  . SIMPLE MASTECTOMY WITH AXILLARY SENTINEL NODE BIOPSY Left 12/23/2017   T2,N2 with 6/7 nodes positive. Whole breast radiation.  Surgeon: Robert Bellow, MD;  Location: [facility] ORS;  Service: General;  Laterality: Left;  . TONSILLECTOMY      Family History  Problem Relation Age of Onset  . Stroke Mother   . Thyroid disease Mother   . Renal Disease Mother   . Stroke Father   . Heart attack Father   . Sudden death Father 57       suicide  . Anuerysm Brother   . Breast cancer Neg Hx      Social History   Socioeconomic History  . Marital status: Widowed    Spouse name: Not on file  . Number of children: Not on file  . Years of education: Not on file  . Highest education level: Not on file  Occupational History  . Not on file  Tobacco Use  . Smoking status: Former Smoker    Packs/day: 0.25    Years: 30.00    Pack years: 7.50    Types: Cigarettes    Quit date: 2019    Years since quitting: 3.0  . Smokeless tobacco: Never Used  Vaping Use  . Vaping Use: Never used  Substance and Sexual Activity  . Alcohol use: Not Currently  . Drug use: Never  . Sexual activity: Not Currently  Other Topics Concern  . Not on file  Social History Narrative  . Not on file   Social Determinants of Health   Financial Resource Strain: Not on file  Food Insecurity: Not on file  Transportation Needs: Not on file  Physical Activity: Not on file  Stress: Not on file  Social Connections: Not on file  Intimate Partner Violence: Not on file     Current Outpatient Medications:  .  acetaminophen (TYLENOL) 500 MG tablet, Take 1,000 mg by mouth every 6 (  six) hours as needed for moderate pain or headache. , Disp: , Rfl:  .  albuterol (PROVENTIL HFA;VENTOLIN HFA) 108 (90 Base) MCG/ACT inhaler, Inhale 2 puffs into the lungs every 6 (six) hours as needed for wheezing or shortness of breath., Disp: 1 Inhaler, Rfl: 2 .  amLODipine (NORVASC) 5 MG tablet, TAKE 1 TABLET BY MOUTH DAILY, Disp: 90 tablet, Rfl: 3 .  aspirin EC 81 MG tablet, Take 81 mg by mouth at bedtime. , Disp: , Rfl:  .  calcium carbonate (OS-CAL - DOSED IN MG OF ELEMENTAL CALCIUM) 1250 (500 Ca) MG tablet, Take 1 tablet by mouth daily with breakfast., Disp: , Rfl:  .  calcium-vitamin D (OSCAL WITH D) 250-125 MG-UNIT tablet, Take 1 tablet by mouth 2 (two) times daily., Disp: , Rfl:  .  Cholecalciferol (VITAMIN D3) 125 MCG (5000 UT) CAPS, Take 1 capsule by mouth 2 (two) times a day., Disp: , Rfl:  .  docusate sodium (COLACE)  100 MG capsule, Take 100 mg by mouth 3 (three) times daily as needed for mild constipation., Disp: , Rfl:  .  hydrochlorothiazide (HYDRODIURIL) 12.5 MG tablet, Take 1 tablet (12.5 mg total) by mouth daily., Disp: 90 tablet, Rfl: 3 .  levothyroxine (SYNTHROID) 100 MCG tablet, TAKE 1 TABLET BY MOUTH DAILY, Disp: 90 tablet, Rfl: 3 .  losartan (COZAAR) 100 MG tablet, Take 1 tablet (100 mg total) by mouth daily., Disp: 90 tablet, Rfl: 3 .  ondansetron (ZOFRAN) 8 MG tablet, Take 1 tablet (8 mg total) by mouth every 8 (eight) hours as needed for nausea or vomiting., Disp: 20 tablet, Rfl: 0 .  palbociclib (IBRANCE) 125 MG tablet, Take 1 tablet (125 mg total) by mouth daily. Take for 21 days on, 7 days off, repeat every 28 days., Disp: 21 tablet, Rfl: 3 .  rosuvastatin (CRESTOR) 5 MG tablet, TAKE 1 TABLET BY MOUTH DAILY, Disp: 30 tablet, Rfl: 6 .  traZODone (DESYREL) 50 MG tablet, TAKE 1 TABLET BY MOUTH DAILY, Disp: 30 tablet, Rfl: 6 .  ALPRAZolam (XANAX) 0.5 MG tablet, Take 1 tablet (0.5 mg total) by mouth 2 (two) times daily., Disp: 60 tablet, Rfl: 0   Allergies  Allergen Reactions  . Sulfa Antibiotics Diarrhea    ROS Review of Systems  Constitutional: Negative.   HENT: Negative.   Respiratory: Negative.   Cardiovascular: Negative.   Genitourinary: Negative.   Musculoskeletal: Negative.   Psychiatric/Behavioral: Negative.      OBJECTIVE:    Physical Exam Constitutional:      Appearance: She is obese.  HENT:     Head: Normocephalic.     Nose: Nose normal.  Cardiovascular:     Rate and Rhythm: Normal rate and regular rhythm.  Pulmonary:     Effort: Pulmonary effort is normal.  Musculoskeletal:     Cervical back: Normal range of motion.  Skin:    General: Skin is warm.  Neurological:     Mental Status: She is alert.     BP 130/75   Pulse 72   Ht 5\' 6"  (1.676 m)   Wt 225 lb 3.2 oz (102.2 kg)   BMI 36.35 kg/m  Wt Readings from Last 3 Encounters:  02/04/20 225 lb 3.2 oz  (102.2 kg)  01/21/20 225 lb (102.1 kg)  12/28/19 229 lb 8 oz (104.1 kg)    Health Maintenance Due  Topic Date Due  . Hepatitis C Screening  Never done  . COVID-19 Vaccine (1) Never done  . TETANUS/TDAP  Never  done  . COLONOSCOPY (Pts 45-80yrs Insurance coverage will need to be confirmed)  Never done  . PNA vac Low Risk Adult (1 of 2 - PCV13) Never done  . INFLUENZA VACCINE  08/16/2019    There are no preventive care reminders to display for this patient.  CBC Latest Ref Rng & Units 01/21/2020 12/24/2019 11/26/2019  WBC 4.0 - 10.5 K/uL 2.3(L) 2.8(L) 2.3(L)  Hemoglobin 12.0 - 15.0 g/dL 11.9(L) 11.8(L) 11.0(L)  Hematocrit 36.0 - 46.0 % 33.5(L) 34.2(L) 31.1(L)  Platelets 150 - 400 K/uL 150 147(L) 142(L)   CMP Latest Ref Rng & Units 01/21/2020 12/24/2019 11/26/2019  Glucose 70 - 99 mg/dL 103(H) 106(H) 105(H)  BUN 8 - 23 mg/dL 19 17 22   Creatinine 0.44 - 1.00 mg/dL 1.19(H) 1.00 1.17(H)  Sodium 135 - 145 mmol/L 135 135 135  Potassium 3.5 - 5.1 mmol/L 3.7 3.9 3.8  Chloride 98 - 111 mmol/L 101 99 101  CO2 22 - 32 mmol/L 26 26 24   Calcium 8.9 - 10.3 mg/dL 9.2 10.1 9.3  Total Protein 6.5 - 8.1 g/dL 7.4 7.4 7.3  Total Bilirubin 0.3 - 1.2 mg/dL 0.9 0.9 0.6  Alkaline Phos 38 - 126 U/L 70 69 63  AST 15 - 41 U/L 21 20 20   ALT 0 - 44 U/L 17 19 19     Lab Results  Component Value Date   TSH 0.925 10/27/2019   Lab Results  Component Value Date   ALBUMIN 4.4 01/21/2020   ANIONGAP 8 01/21/2020   Lab Results  Component Value Date   CHOL 202 (H) 12/15/2019   CHOL 151 02/18/2015   HDL 104 12/15/2019   HDL 65 02/18/2015   LDLCALC 87 12/15/2019   LDLCALC 76 02/18/2015   CHOLHDL 1.9 12/15/2019   CHOLHDL 2.3 02/18/2015   Lab Results  Component Value Date   TRIG 60 12/15/2019   Lab Results  Component Value Date   HGBA1C 5.9 (H) 10/04/2017   HGBA1C 5.6 02/18/2015      ASSESSMENT & PLAN:   Problem List Items Addressed This Visit      Other   Anxiety    Patient in today with need  for anxiety medication refill. Anxiety has been well controlled on current meds, no feelings of being down depressed or hopeless.       Relevant Medications   ALPRAZolam (XANAX) 0.5 MG tablet      Meds ordered this encounter  Medications  . DISCONTD: ALPRAZolam (XANAX) 0.5 MG tablet    Sig: Take 1 tablet (0.5 mg total) by mouth 2 (two) times daily.    Dispense:  60 tablet    Refill:  0  . losartan (COZAAR) 100 MG tablet    Sig: Take 1 tablet (100 mg total) by mouth daily.    Dispense:  90 tablet    Refill:  3  . hydrochlorothiazide (HYDRODIURIL) 12.5 MG tablet    Sig: Take 1 tablet (12.5 mg total) by mouth daily.    Dispense:  90 tablet    Refill:  3  . ALPRAZolam (XANAX) 0.5 MG tablet    Sig: Take 1 tablet (0.5 mg total) by mouth 2 (two) times daily.    Dispense:  60 tablet    Refill:  0      Follow-up: No follow-ups on file.    Beckie Salts, [facility] [address], [place],  [address]

## 2020-02-10 ENCOUNTER — Other Ambulatory Visit: Payer: Self-pay | Admitting: Pharmacist

## 2020-02-10 DIAGNOSIS — C50912 Malignant neoplasm of unspecified site of left female breast: Secondary | ICD-10-CM

## 2020-02-10 MED ORDER — PALBOCICLIB 125 MG PO TABS: 125.0000 mg | ORAL_TABLET | Freq: Every day | ORAL | 3 refills | Status: DC

## 2020-02-11 ENCOUNTER — Ambulatory Visit
Admission: RE | Admit: 2020-02-11 | Discharge: 2020-02-11 | Disposition: A | Discharge status: Home / Self Care | Payer: Medicare Other | Source: Ambulatory Visit | Attending: Radiation Oncology | Admitting: Radiation Oncology

## 2020-02-11 ENCOUNTER — Encounter: Payer: Self-pay | Admitting: Radiation Oncology

## 2020-02-11 ENCOUNTER — Other Ambulatory Visit: Payer: Self-pay

## 2020-02-11 ENCOUNTER — Ambulatory Visit
Admission: RE | Admit: 2020-02-11 | Discharge: 2020-02-11 | Disposition: A | Discharge status: Home / Self Care | Payer: Medicare Other | Source: Home / Self Care | Attending: Radiation Oncology | Admitting: Radiation Oncology

## 2020-02-11 VITALS — BP 127/72 | HR 67 | Temp 95.8°F | Resp 16 | Wt 226.6 lb

## 2020-02-11 DIAGNOSIS — M25522 Pain in left elbow: Secondary | ICD-10-CM | POA: Diagnosis not present

## 2020-02-11 DIAGNOSIS — C7951 Secondary malignant neoplasm of bone: Secondary | ICD-10-CM

## 2020-02-11 DIAGNOSIS — C50412 Malignant neoplasm of upper-outer quadrant of left female breast: Secondary | ICD-10-CM | POA: Insufficient documentation

## 2020-02-11 DIAGNOSIS — Z170 Estrogen receptor positive status [ER+]: Secondary | ICD-10-CM | POA: Diagnosis not present

## 2020-02-11 DIAGNOSIS — Z853 Personal history of malignant neoplasm of breast: Secondary | ICD-10-CM | POA: Diagnosis not present

## 2020-02-11 DIAGNOSIS — Z923 Personal history of irradiation: Secondary | ICD-10-CM | POA: Diagnosis not present

## 2020-02-11 DIAGNOSIS — M19022 Primary osteoarthritis, left elbow: Secondary | ICD-10-CM | POA: Diagnosis not present

## 2020-02-11 DIAGNOSIS — Z08 Encounter for follow-up examination after completed treatment for malignant neoplasm: Secondary | ICD-10-CM | POA: Diagnosis not present

## 2020-02-11 MED FILL — IBRANCE 125 MG TABS: 125 | 28 days supply | Qty: 21 | Fill #0

## 2020-02-11 NOTE — Progress Notes (Signed)
Radiation Oncology Follow up Note  Name: Alicia [name]   Date:   02/11/2020 MRN:  [id number] DOB: Mar 14, 1950    This 70 y.o. female presents to the clinic today for 109-month follow-up status post palliative radiation therapy to her left hip humerus and scapula.Marland Kitchen  REFERRING PROVIDER: [name], MD  HPI: Patient is a 70 year old female with known stage IV invasive mammary carcinoma.  Previously treated to her left hip humerus and supra clavicle for palliation of pain.  She is having no significant pain in those areas at this time.  She does have left elbow pain for which I have ordered a plain film today.  She is currently on [place], Faslodex, [place] which she is tolerating well.  She had a CT scan back in October for pulmonary embolus did not show any pathology.  She also an MRI scan of her brain showed no evidence of intracranial metastasis back in June.  COMPLICATIONS OF TREATMENT: none  FOLLOW UP COMPLIANCE: keeps appointments   PHYSICAL EXAM:  BP 127/72 (BP Location: Left Arm, Patient Position: Sitting)   Pulse 67   Temp (!) 95.8 F (35.4 C) (Tympanic)   Resp 16   Wt 226 lb 9.6 oz (102.8 kg)   BMI 36.57 kg/m  No pain is elicited on deep palpation of her spine.  No pain is elicited on range of motion of her upper extremities.  Well-developed well-nourished patient in NAD. HEENT reveals PERLA, EOMI, discs not visualized.  Oral cavity is clear. No oral mucosal lesions are identified. Neck is clear without evidence of cervical or supraclavicular adenopathy. Lungs are clear to A&P. Cardiac examination is essentially unremarkable with regular rate and rhythm without murmur rub or thrill. Abdomen is benign with no organomegaly or masses noted. Motor sensory and DTR levels are equal and symmetric in the upper and lower extremities. Cranial nerves II through XII are grossly intact. Proprioception is intact. No peripheral adenopathy or edema is identified. No motor or sensory levels are  noted. Crude visual fields are within normal range.  RADIOLOGY RESULTS: Plain films of her elbow on my cursory review shows no evidence to suggest metastatic disease  PLAN: Present time patient is doing well currently on treatment by medical oncology.  At this time I am turning follow-up care over to medical oncology we would be happy to reevaluate the patient anytime should further palliative treatment be indicated.  I would like to take this opportunity to thank you for allowing me to participate in the care of your patient.Noreene Filbert, MD

## 2020-02-12 NOTE — Progress Notes (Signed)
[facility]  Telephone:([phone]) [phone] [phone]) [phone]  ID: Alicia [name] OB: Apr 18, 1950  MR#: [id number]  MEQ#:[id number]  Patient Care Team: [name], [name], Harrell Gave, [name] Rico Junker, RN as Registered Nurse Lloyd Huger, MD as Consulting Physician (Oncology) Bary Castilla Forest Gleason, MD as Consulting Physician (General Surgery) Bary Castilla Forest Gleason, MD as Consulting Physician (General Surgery) Noreene Filbert, MD as Referring Physician (Radiation Oncology) Jeral Fruit, RN as Registered Nurse   CHIEF COMPLAINT: Recurrent stage IV ER/PR positive, 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  INTERVAL HISTORY: Patient returns to clinic today for further evaluation and continuation of Ibrance, Faslodex, and Xgeva.  She is tolerating her treatments well without significant side effects.  She currently feels well and is asymptomatic. She does not complain of pain today.  She continues to have a mild peripheral neuropathy, but no other neurologic complaints.  She denies any chest pain, shortness of breath, cough, or hemoptysis.  She denies any nausea, vomiting, constipation, or diarrhea.  She has no urinary complaints.  Patient offers no further specific complaints today.  REVIEW OF SYSTEMS:   Review of Systems  Constitutional: Negative for fever, malaise/fatigue and weight loss.  HENT: Negative for congestion.   Respiratory: Negative.  Negative for cough and shortness of breath.   Cardiovascular: Negative.  Negative for chest pain and leg swelling.  Gastrointestinal: Negative.  Negative for abdominal pain, constipation, diarrhea and nausea.  Genitourinary: Negative.  Negative for dysuria and urgency.  Musculoskeletal: Negative.  Negative for back pain and joint pain.  Skin: Negative.  Negative for rash.  Neurological: Positive for tingling and sensory change.  Negative for dizziness, focal weakness, weakness and headaches.  Psychiatric/Behavioral: Negative.  The patient is not nervous/anxious.     As per HPI. Otherwise, a complete review of systems is negative.  PAST MEDICAL HISTORY: Past Medical History:  Diagnosis Date  . Anxiety   . Cancer ([place]) 07/2017   left breast  . Depression   . Hypertension   . Hypothyroidism   . Personal history of chemotherapy 2019   LEFT mastectomy-chemo before  . Personal history of radiation therapy 03/2018   LEFT mastectomy  . Rapid heart rate   . Thyroid disease     PAST SURGICAL HISTORY: Past Surgical History:  Procedure Laterality Date  . AXILLARY LYMPH NODE BIOPSY Left 07/16/2017   METASTATIC MAMMARY CARCINOMA  . BREAST BIOPSY Left 07/16/2017   [place] bx of left breast mass and left breast LN.  INVASIVE MAMMARY CARCINOMA, NO SPECIAL TYPE.   Marland Kitchen BREAST EXCISIONAL BIOPSY Right 2001   benign  . BREAST LUMPECTOMY WITH SENTINEL LYMPH NODE BIOPSY Left 12/06/2017   Procedure: BREAST LUMPECTOMY WITH SENTINEL LYMPH NODE BX;  Surgeon: Robert Bellow, MD;  Location: [facility] ORS;  Service: General;  Laterality: Left;  . COLONOSCOPY    . MASTECTOMY Left 12/23/2017  . PORTACATH PLACEMENT Right 08/07/2017   Procedure: INSERTION PORT-A-CATH;  Surgeon: Robert Bellow, MD;  Location: [facility] ORS;  Service: General;  Laterality: Right;  . SIMPLE MASTECTOMY WITH AXILLARY SENTINEL NODE BIOPSY Left 12/23/2017   T2,N2 with 6/7 nodes positive. Whole breast radiation.  Surgeon: Robert Bellow, MD;  Location: [facility] ORS;  Service: General;  Laterality: Left;  . TONSILLECTOMY      FAMILY HISTORY: Family History  Problem Relation Age of Onset  . Stroke Mother   . Thyroid disease Mother   . Renal  Disease Mother   . Stroke Father   . Heart attack Father   . Sudden death Father 18       suicide  . Anuerysm Brother   . Breast cancer Neg Hx     ADVANCED DIRECTIVES (Y/N):  N  HEALTH MAINTENANCE: Social History    Tobacco Use  . Smoking status: Former Smoker    Packs/day: 1.00    Years: 30.00    Pack years: 30.00    Types: Cigarettes    Quit date: 2019    Years since quitting: 3.0  . Smokeless tobacco: Never Used  Vaping Use  . Vaping Use: Never used  Substance Use Topics  . Alcohol use: Not Currently  . Drug use: Never     Colonoscopy:  PAP:  Bone density:  Lipid panel:  Allergies  Allergen Reactions  . Sulfa Antibiotics Diarrhea    Current Outpatient Medications  Medication Sig Dispense Refill  . acetaminophen (TYLENOL) 500 MG tablet Take 1,000 mg by mouth every 6 (six) hours as needed for moderate pain or headache.     . albuterol (PROVENTIL HFA;VENTOLIN HFA) 108 (90 Base) MCG/ACT inhaler Inhale 2 puffs into the lungs every 6 (six) hours as needed for wheezing or shortness of breath. 1 Inhaler 2  . ALPRAZolam (XANAX) 0.5 MG tablet Take 1 tablet (0.5 mg total) by mouth 2 (two) times daily. 60 tablet 0  . amLODipine (NORVASC) 5 MG tablet TAKE 1 TABLET BY MOUTH DAILY 90 tablet 3  . aspirin EC 81 MG tablet Take 81 mg by mouth at bedtime.     . calcium carbonate (OS-CAL - DOSED IN MG OF ELEMENTAL CALCIUM) 1250 (500 Ca) MG tablet Take 1 tablet by mouth daily with breakfast.    . Cholecalciferol (VITAMIN D3) 125 MCG (5000 UT) CAPS Take 1 capsule by mouth 2 (two) times a day.    . docusate sodium (COLACE) 100 MG capsule Take 100 mg by mouth 3 (three) times daily as needed for mild constipation.    . hydrochlorothiazide (HYDRODIURIL) 12.5 MG tablet Take 1 tablet (12.5 mg total) by mouth daily. 90 tablet 3  . levothyroxine (SYNTHROID) 100 MCG tablet TAKE 1 TABLET BY MOUTH DAILY 90 tablet 3  . losartan (COZAAR) 100 MG tablet Take 1 tablet (100 mg total) by mouth daily. 90 tablet 3  . palbociclib (IBRANCE) 125 MG tablet Take 1 tablet (125 mg total) by mouth daily. Take for 21 days on, 7 days off, repeat every 28 days. 21 tablet 3  . rosuvastatin (CRESTOR) 5 MG tablet TAKE 1 TABLET BY MOUTH  DAILY 30 tablet 6  . traZODone (DESYREL) 50 MG tablet TAKE 1 TABLET BY MOUTH DAILY 30 tablet 6  . ondansetron (ZOFRAN) 8 MG tablet Take 1 tablet (8 mg total) by mouth every 8 (eight) hours as needed for nausea or vomiting. (Patient not taking: Reported on 02/18/2020) 20 tablet 0   No current facility-administered medications for this visit.    OBJECTIVE: Vitals:   02/18/20 1352  BP: 119/72  Pulse: 61  Resp: 18  Temp: 97.9 F (36.6 C)     Body mass index is 36.48 kg/m.    ECOG FS:0 - Asymptomatic  General: Well-developed, well-nourished, no acute distress. Eyes: Pink conjunctiva, anicteric sclera. HEENT: Normocephalic, moist mucous membranes. Lungs: No audible wheezing or coughing. Heart: Regular rate and rhythm. Abdomen: Soft, nontender, no obvious distention. Musculoskeletal: No edema, cyanosis, or clubbing. Neuro: Alert, answering all questions appropriately. Cranial nerves grossly intact. Skin:  No rashes or petechiae noted. Psych: Normal affect.   LAB RESULTS:  Lab Results  Component Value Date   NA 138 02/18/2020   K 4.2 02/18/2020   CL 103 02/18/2020   CO2 26 02/18/2020   GLUCOSE 106 (H) 02/18/2020   BUN 17 02/18/2020   CREATININE 1.11 (H) 02/18/2020   CALCIUM 9.4 02/18/2020   PROT 7.1 02/18/2020   ALBUMIN 4.1 02/18/2020   AST 20 02/18/2020   ALT 15 02/18/2020   ALKPHOS 68 02/18/2020   BILITOT 0.6 02/18/2020   GFRNONAA 53 (L) 02/18/2020   GFRAA 57 (L) 10/01/2019    Lab Results  Component Value Date   WBC 1.9 (L) 02/18/2020   NEUTROABS 0.9 (L) 02/18/2020   HGB 11.1 (L) 02/18/2020   HCT 31.3 (L) 02/18/2020   MCV 100.0 02/18/2020   PLT 160 02/18/2020     STUDIES: DG Elbow 2 Views Left  Result Date: 02/11/2020 CLINICAL DATA:  Left elbow pain for several days, history of metastatic breast cancer EXAM: LEFT ELBOW - 2 VIEW COMPARISON:  06/30/2019 FINDINGS: Frontal and lateral views of the left elbow are obtained. There are no acute or destructive bony  lesions. Mild osteoarthritis, with spurring of the radial head. No joint effusion. Soft tissues are unremarkable. IMPRESSION: 1. Mild osteoarthritis.  No acute or destructive bony lesions. Electronically Signed   By: Randa Ngo M.D.   On: 02/11/2020 16:10   ONCOLOGY HISTORY: Patient initially received neoadjuvant chemotherapy with Adriamycin and Cytoxan followed by weekly Taxol. She only received 4 cycles of weekly Taxol prior to discontinuation of treatment on October 31, 2017 secondary to persistent peripheral neuropathy.  She ultimately required mastectomy and final pathology noted 5 of 6 lymph nodes positive for disease.  Patient completed adjuvant XRT in mid April 2020.  Pain initiated letrozole in May 2020, this was discontinued secondary to side effects and patient was started on anastrozole.  Nuclear medicine bone scan on June 19, 2019 revealed metastatic disease and anastrozole was subsequently discontinued.  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  PLAN:    1. 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  MRI of the brain on August 24, 2019 reviewed independently with no obvious evidence of metastatic disease.  Patient's CA 27-29 initially increased to 141.9, but continues to slowly trend down and is now 47.5.  Although patient has a mild neutropenia, will continue 125 mg Ibrance for 21 days with 7 days off.  Proceed with Faslodex and Xgeva today.  Return to clinic in 4 weeks for further evaluation and continuation of treatment.  Appreciate clinical pharmacy input. 2.  Peripheral neuropathy: Chronic and unchanged. 3.  Pulmonary embolus: Patient was diagnosed with a small pulmonary embolus on January 10, 2018.  She is no longer on anticoagulation.  There  was no evidence of recurrent PE on CT scan from October 27, 2019. 4.  Osteopenia: Patient's most recent bone mineral density on March 03, 2019 reported a T score of -1.8 which is only mildly decreased from 1 year prior when the reported T score was -1.6.  Continue calcium and vitamin D supplementation.  Xgeva as above. 5.  Left hip pain: Resolved.  Secondary to metastatic disease. Patient has completed XRT. 6.  Leukopenia: Slightly worse today.  Monitor.  Proceed with treatment as planned. 7.  Anemia: Chronic and unchanged.  Hemoglobin has trended down slightly to 11.3, monitor.  Patient expressed understanding and was in agreement with this plan. She also understands that She can call clinic at any time with any questions, concerns, or complaints.   Cancer Staging Breast cancer, stage 2, left ([place]) Staging form: Breast, AJCC 8th Edition - Clinical stage from 07/30/2017: Stage IIA (cT2, cN1, cM0, G2, ER+, PR+, HER2-) - Signed by Lloyd Huger, MD on 07/30/2017 Histologic grading system: 3 grade system Laterality: Left   Lloyd Huger, MD   02/19/2020 5:58 AM

## 2020-02-15 ENCOUNTER — Institutional Professional Consult (permissible substitution): Payer: Medicare Other | Admitting: Primary Care

## 2020-02-17 ENCOUNTER — Other Ambulatory Visit: Payer: Self-pay

## 2020-02-17 ENCOUNTER — Encounter: Payer: Self-pay | Admitting: Primary Care

## 2020-02-17 ENCOUNTER — Ambulatory Visit (INDEPENDENT_AMBULATORY_CARE_PROVIDER_SITE_OTHER): Payer: Medicare Other | Admitting: Primary Care

## 2020-02-17 VITALS — BP 128/74 | HR 63 | Temp 97.3°F | Ht 66.0 in | Wt 226.0 lb

## 2020-02-17 DIAGNOSIS — R400 Somnolence: Secondary | ICD-10-CM | POA: Diagnosis not present

## 2020-02-17 NOTE — Progress Notes (Signed)
@Patient  ID: Alicia [name], female    DOB: 08/27/1950, 70 y.o.   MRN: [id number]  Chief Complaint  Patient presents with  . sleep consult    Per Dr. Saunders Revel-- no prior sleep study. C/o wakes with rapid heart rate and daytime sleepiness.     Referring provider: [name], MD  HPI: 70 year old female, former smoker quit in 2019. PMH significant for HTN, pulmonary embolism, GERD, breast cancer stage 2, obesity.   02/17/2020 Patient presents today for sleep consult. Referrd by Cardiology. She had zio patch that showed evidence of possible underlying sleep apnea. Patient reports symptoms of choking/struggling to breath at night and daytime fatigue. She sometimes she has a hard time falling asleep at night. States that she gets anxious and/or afraid that she will over sleep if she has an apt the next day. She gets on average 6 hours of sleep a night. She wakes up 2-3 times at night to use the restroom.   Sleep Questionnaire Symptoms: Choking at night, daytime somnolence  Previous sleep study: No Typical bedtime: 12am Length of time to fall asleep: 1-2 hours at Nocturnal awakenings: 2 Out of bed in the morning: 10am  Epworth Score: 7/24   Allergies  Allergen Reactions  . Sulfa Antibiotics Diarrhea    Immunization History  Administered Date(s) Administered  . Influenza, High Dose Seasonal PF 10/31/2017    Past Medical History:  Diagnosis Date  . Anxiety   . Cancer ([place]) 07/2017   left breast  . Depression   . Hypertension   . Hypothyroidism   . Personal history of chemotherapy 2019   LEFT mastectomy-chemo before  . Personal history of radiation therapy 03/2018   LEFT mastectomy  . Rapid heart rate   . Thyroid disease     Tobacco History: Social History   Tobacco Use  Smoking Status Former Smoker  . Packs/day: 1.00  . Years: 30.00  . Pack years: 30.00  . Types: Cigarettes  . Quit date: 2019  . Years since quitting: 3.0  Smokeless Tobacco Never Used    Counseling given: Not Answered   Outpatient Medications Prior to Visit  Medication Sig Dispense Refill  . acetaminophen (TYLENOL) 500 MG tablet Take 1,000 mg by mouth every 6 (six) hours as needed for moderate pain or headache.     . albuterol (PROVENTIL HFA;VENTOLIN HFA) 108 (90 Base) MCG/ACT inhaler Inhale 2 puffs into the lungs every 6 (six) hours as needed for wheezing or shortness of breath. 1 Inhaler 2  . ALPRAZolam (XANAX) 0.5 MG tablet Take 1 tablet (0.5 mg total) by mouth 2 (two) times daily. 60 tablet 0  . amLODipine (NORVASC) 5 MG tablet TAKE 1 TABLET BY MOUTH DAILY 90 tablet 3  . aspirin EC 81 MG tablet Take 81 mg by mouth at bedtime.     . calcium carbonate (OS-CAL - DOSED IN MG OF ELEMENTAL CALCIUM) 1250 (500 Ca) MG tablet Take 1 tablet by mouth daily with breakfast.    . Cholecalciferol (VITAMIN D3) 125 MCG (5000 UT) CAPS Take 1 capsule by mouth 2 (two) times a day.    . docusate sodium (COLACE) 100 MG capsule Take 100 mg by mouth 3 (three) times daily as needed for mild constipation.    . hydrochlorothiazide (HYDRODIURIL) 12.5 MG tablet Take 1 tablet (12.5 mg total) by mouth daily. 90 tablet 3  . levothyroxine (SYNTHROID) 100 MCG tablet TAKE 1 TABLET BY MOUTH DAILY 90 tablet 3  . losartan (COZAAR) 100  MG tablet Take 1 tablet (100 mg total) by mouth daily. 90 tablet 3  . ondansetron (ZOFRAN) 8 MG tablet Take 1 tablet (8 mg total) by mouth every 8 (eight) hours as needed for nausea or vomiting. (Patient not taking: Reported on 02/18/2020) 20 tablet 0  . palbociclib (IBRANCE) 125 MG tablet Take 1 tablet (125 mg total) by mouth daily. Take for 21 days on, 7 days off, repeat every 28 days. 21 tablet 3  . rosuvastatin (CRESTOR) 5 MG tablet TAKE 1 TABLET BY MOUTH DAILY 30 tablet 6  . traZODone (DESYREL) 50 MG tablet TAKE 1 TABLET BY MOUTH DAILY 30 tablet 6  . calcium-vitamin D (OSCAL WITH D) 250-125 MG-UNIT tablet Take 1 tablet by mouth 2 (two) times daily.     No  facility-administered medications prior to visit.    Review of Systems  Review of Systems  Constitutional: Positive for fatigue.  Respiratory: Negative.   Psychiatric/Behavioral: Positive for sleep disturbance.   Physical Exam  BP 128/74 (BP Location: Left Arm, Cuff Size: Normal)   Pulse 63   Temp (!) 97.3 F (36.3 C) (Temporal)   Ht 5\' 6"  (1.676 m)   Wt 226 lb (102.5 kg)   SpO2 97%   BMI 36.48 kg/m  Physical Exam Constitutional:      Appearance: Normal appearance.  HENT:     Mouth/Throat:     Mouth: Mucous membranes are moist.     Pharynx: Oropharynx is clear.  Cardiovascular:     Rate and Rhythm: Normal rate and regular rhythm.  Pulmonary:     Effort: Pulmonary effort is normal.     Breath sounds: Normal breath sounds.  Musculoskeletal:        General: Normal range of motion.  Skin:    General: Skin is warm and dry.  Neurological:     General: No focal deficit present.     Mental Status: She is alert and oriented to person, place, and time. Mental status is at baseline.  Psychiatric:        Mood and Affect: Mood normal.        Behavior: Behavior normal.        Thought Content: Thought content normal.        Judgment: Judgment normal.      Lab Results:  CBC    Component Value Date/Time   WBC 1.9 (L) 02/18/2020 1309   RBC 3.13 (L) 02/18/2020 1309   HGB 11.1 (L) 02/18/2020 1309   HGB 14.2 02/11/2011 1349   HCT 31.3 (L) 02/18/2020 1309   HCT 40.9 02/11/2011 1349   PLT 160 02/18/2020 1309   PLT 151 02/11/2011 1349   MCV 100.0 02/18/2020 1309   MCV 89 02/11/2011 1349   MCH 35.5 (H) 02/18/2020 1309   MCHC 35.5 02/18/2020 1309   RDW 14.3 02/18/2020 1309   RDW 13.2 02/11/2011 1349   LYMPHSABS PENDING 02/18/2020 1309   MONOABS PENDING 02/18/2020 1309   EOSABS PENDING 02/18/2020 1309   BASOSABS PENDING 02/18/2020 1309    BMET    Component Value Date/Time   NA 138 02/18/2020 1309   NA 142 02/11/2011 1349   K 4.2 02/18/2020 1309   K 3.8 02/11/2011  1349   CL 103 02/18/2020 1309   CL 107 02/11/2011 1349   CO2 26 02/18/2020 1309   CO2 24 02/11/2011 1349   GLUCOSE 106 (H) 02/18/2020 1309   GLUCOSE 98 02/11/2011 1349   BUN 17 02/18/2020 1309   BUN 14 02/11/2011  1349   CREATININE 1.11 (H) 02/18/2020 1309   CREATININE 0.65 02/11/2011 1349   CALCIUM 9.4 02/18/2020 1309   CALCIUM 9.0 02/11/2011 1349   GFRNONAA 53 (L) 02/18/2020 1309   GFRNONAA >60 02/11/2011 1349   GFRAA 57 (L) 10/01/2019 1348   GFRAA >60 02/11/2011 1349    BNP No results found for: BNP  ProBNP No results found for: PROBNP  Imaging: DG Elbow 2 Views Left  Result Date: 02/11/2020 CLINICAL DATA:  Left elbow pain for several days, history of metastatic breast cancer EXAM: LEFT ELBOW - 2 VIEW COMPARISON:  06/30/2019 FINDINGS: Frontal and lateral views of the left elbow are obtained. There are no acute or destructive bony lesions. Mild osteoarthritis, with spurring of the radial head. No joint effusion. Soft tissues are unremarkable. IMPRESSION: 1. Mild osteoarthritis.  No acute or destructive bony lesions. Electronically Signed   By: Randa Ngo M.D.   On: 02/11/2020 16:10     Assessment & Plan:   Daytime somnolence - Patient reports symptoms of choking/gasping for air and daytime fatigue. Epworth 7/24. I have a moderate suspicion patient may have underlying sleep apnea, she needs a home sleep study to evaluate  - Discussed with patient that untreated sleep apnea puts patient at a higher risk for cardiovascular disease, cardiac arrhythmias, pulmonary hypertension, diabetes, stroke and increase in daytime accidents. We reviewed treatment options including weight loss, side sleeping position, oral appliance, CPAP or referral to ENT for possible surgical interventions - Advised patient not to drive if experiencing excessive daytime fatigue or somnolence - FU in 4 weeks after sleep study to review result and treatment options     Martyn Ehrich,  NP 02/18/2020

## 2020-02-17 NOTE — Patient Instructions (Addendum)
Untreated sleep apnea puts you at high risk for cardiovascular disease, cardiac arrhythmias, pulmonary hypertension, stroke, diabetes, increased risk for daytime accidents   Treatment options for sleep apnea include weight loss, side sleeping position, oral appliance, CPAP, referral to ENT for possible surgical intervention   Do not drive if experiencing excessive daytime fatigue or somnolence   Orders: Home sleep study re: daytime somnolence     Follow-up: 4 weeks fu televisit (after sleep study)   Sleep Apnea Sleep apnea affects breathing during sleep. It causes breathing to stop for a short time or to become shallow. It can also increase the risk of:  Heart attack.  Stroke.  Being very overweight (obese).  Diabetes.  Heart failure.  Irregular heartbeat. The goal of treatment is to help you breathe normally again. What are the causes? There are three kinds of sleep apnea:  Obstructive sleep apnea. This is caused by a blocked or collapsed airway.  Central sleep apnea. This happens when the brain does not send the right signals to the muscles that control breathing.  Mixed sleep apnea. This is a combination of obstructive and central sleep apnea. The most common cause of this condition is a collapsed or blocked airway. This can happen if:  Your throat muscles are too relaxed.  Your tongue and tonsils are too large.  You are overweight.  Your airway is too small.   What increases the risk?  Being overweight.  Smoking.  Having a small airway.  Being older.  Being female.  Drinking alcohol.  Taking medicines to calm yourself (sedatives or tranquilizers).  Having family members with the condition. What are the signs or symptoms?  Trouble staying asleep.  Being sleepy or tired during the day.  Getting angry a lot.  Loud snoring.  Headaches in the morning.  Not being able to focus your mind (concentrate).  Forgetting things.  Less interest in  sex.  Mood swings.  Personality changes.  Feelings of sadness (depression).  Waking up a lot during the night to pee (urinate).  Dry mouth.  Sore throat. How is this diagnosed?  Your medical history.  A physical exam.  A test that is done when you are sleeping (sleep study). The test is most often done in a sleep lab but may also be done at home. How is this treated?  Sleeping on your side.  Using a medicine to get rid of mucus in your nose (decongestant).  Avoiding the use of alcohol, medicines to help you relax, or certain pain medicines (narcotics).  Losing weight, if needed.  Changing your diet.  Not smoking.  Using a machine to open your airway while you sleep, such as: ? An oral appliance. This is a mouthpiece that shifts your lower jaw forward. ? A CPAP device. This device blows air through a mask when you breathe out (exhale). ? An EPAP device. This has valves that you put in each nostril. ? A BPAP device. This device blows air through a mask when you breathe in (inhale) and breathe out.  Having surgery if other treatments do not work. It is important to get treatment for sleep apnea. Without treatment, it can lead to:  High blood pressure.  Coronary artery disease.  In men, not being able to have an erection (impotence).  Reduced thinking ability.   Follow these instructions at home: Lifestyle  Make changes that your doctor recommends.  Eat a healthy diet.  Lose weight if needed.  Avoid alcohol, medicines to  help you relax, and some pain medicines.  Do not use any products that contain nicotine or tobacco, such as cigarettes, e-cigarettes, and chewing tobacco. If you need help quitting, ask your doctor. General instructions  Take over-the-counter and prescription medicines only as told by your doctor.  If you were given a machine to use while you sleep, use it only as told by your doctor.  If you are having surgery, make sure to tell your  doctor you have sleep apnea. You may need to bring your device with you.  Keep all follow-up visits as told by your doctor. This is important. Contact a doctor if:  The machine that you were given to use during sleep bothers you or does not seem to be working.  You do not get better.  You get worse. Get help right away if:  Your chest hurts.  You have trouble breathing in enough air.  You have an uncomfortable feeling in your back, arms, or stomach.  You have trouble talking.  One side of your body feels weak.  A part of your face is hanging down. These symptoms may be an emergency. Do not wait to see if the symptoms will go away. Get medical help right away. Call your local emergency services (911 in the [place] Do not drive yourself to the hospital. Summary  This condition affects breathing during sleep.  The most common cause is a collapsed or blocked airway.  The goal of treatment is to help you breathe normally while you sleep. This information is not intended to replace advice given to you by your health care provider. Make sure you discuss any questions you have with your health care provider. Document Revised: 10/18/2017 Document Reviewed: 08/27/2017 Elsevier Patient Education  [place].

## 2020-02-18 ENCOUNTER — Inpatient Hospital Stay: Payer: Medicare Other

## 2020-02-18 ENCOUNTER — Inpatient Hospital Stay: Payer: Medicare Other | Attending: Oncology

## 2020-02-18 ENCOUNTER — Inpatient Hospital Stay: Payer: Medicare Other | Admitting: Pharmacist

## 2020-02-18 ENCOUNTER — Encounter: Payer: Self-pay | Admitting: Oncology

## 2020-02-18 ENCOUNTER — Inpatient Hospital Stay (HOSPITAL_BASED_OUTPATIENT_CLINIC_OR_DEPARTMENT_OTHER): Payer: Medicare Other | Admitting: Oncology

## 2020-02-18 ENCOUNTER — Telehealth: Payer: Self-pay | Admitting: Pharmacy Technician

## 2020-02-18 VITALS — BP 119/72 | HR 61 | Temp 97.9°F | Resp 18 | Wt 226.0 lb

## 2020-02-18 DIAGNOSIS — C50912 Malignant neoplasm of unspecified site of left female breast: Secondary | ICD-10-CM | POA: Diagnosis not present

## 2020-02-18 DIAGNOSIS — R400 Somnolence: Secondary | ICD-10-CM | POA: Insufficient documentation

## 2020-02-18 DIAGNOSIS — C7951 Secondary malignant neoplasm of bone: Secondary | ICD-10-CM | POA: Diagnosis not present

## 2020-02-18 DIAGNOSIS — Z79899 Other long term (current) drug therapy: Secondary | ICD-10-CM | POA: Insufficient documentation

## 2020-02-18 DIAGNOSIS — C50919 Malignant neoplasm of unspecified site of unspecified female breast: Secondary | ICD-10-CM

## 2020-02-18 DIAGNOSIS — Z5111 Encounter for antineoplastic chemotherapy: Secondary | ICD-10-CM | POA: Insufficient documentation

## 2020-02-18 LAB — COMPREHENSIVE METABOLIC PANEL
ALT: 15 U/L (ref 0–44)
AST: 20 U/L (ref 15–41)
Albumin: 4.1 g/dL (ref 3.5–5.0)
Alkaline Phosphatase: 68 U/L (ref 38–126)
Anion gap: 9 (ref 5–15)
BUN: 17 mg/dL (ref 8–23)
CO2: 26 mmol/L (ref 22–32)
Calcium: 9.4 mg/dL (ref 8.9–10.3)
Chloride: 103 mmol/L (ref 98–111)
Creatinine, Ser: 1.11 mg/dL — ABNORMAL HIGH (ref 0.44–1.00)
GFR, Estimated: 53 mL/min — ABNORMAL LOW (ref >60)
Glucose, Bld: 106 mg/dL — ABNORMAL HIGH (ref 70–99)
Potassium: 4.2 mmol/L (ref 3.5–5.1)
Sodium: 138 mmol/L (ref 135–145)
Total Bilirubin: 0.6 mg/dL (ref 0.3–1.2)
Total Protein: 7.1 g/dL (ref 6.5–8.1)

## 2020-02-18 LAB — CBC WITH DIFFERENTIAL/PLATELET
Abs Immature Granulocytes: 0.01 10*3/uL (ref 0.00–0.07)
Basophils Absolute: 0 10*3/uL (ref 0.0–0.1)
Basophils Relative: 2 %
Eosinophils Absolute: 0 10*3/uL (ref 0.0–0.5)
Eosinophils Relative: 2 %
HCT: 31.3 % — ABNORMAL LOW (ref 36.0–46.0)
Hemoglobin: 11.1 g/dL — ABNORMAL LOW (ref 12.0–15.0)
Immature Granulocytes: 1 %
Lymphocytes Relative: 24 %
Lymphs Abs: 0.5 10*3/uL — ABNORMAL LOW (ref 0.7–4.0)
MCH: 35.5 pg — ABNORMAL HIGH (ref 26.0–34.0)
MCHC: 35.5 g/dL (ref 30.0–36.0)
MCV: 100 fL (ref 80.0–100.0)
Monocytes Absolute: 0.5 10*3/uL (ref 0.1–1.0)
Monocytes Relative: 23 %
Neutro Abs: 0.9 10*3/uL — ABNORMAL LOW (ref 1.7–7.7)
Neutrophils Relative %: 48 %
Platelets: 160 10*3/uL (ref 150–400)
RBC: 3.13 MIL/uL — ABNORMAL LOW (ref 3.87–5.11)
RDW: 14.3 % (ref 11.5–15.5)
Smear Review: NORMAL
WBC: 1.9 10*3/uL — ABNORMAL LOW (ref 4.0–10.5)
nRBC: 0 % (ref 0.0–0.2)

## 2020-02-18 MED ORDER — DENOSUMAB 120 MG/1.7ML ~~LOC~~ SOLN
120.0000 mg | Freq: Once | SUBCUTANEOUS | Status: AC
  Administered 2020-02-18: 120 mg via SUBCUTANEOUS
  Filled 2020-02-18: qty 1.7

## 2020-02-18 MED ORDER — FULVESTRANT 250 MG/5ML IM SOLN
500.0000 mg | Freq: Once | INTRAMUSCULAR | Status: AC
  Administered 2020-02-18: 500 mg via INTRAMUSCULAR
  Filled 2020-02-18: qty 10

## 2020-02-18 NOTE — Assessment & Plan Note (Signed)
-   Patient reports symptoms of choking/gasping for air and daytime fatigue. Epworth 7/24. I have a moderate suspicion patient may have underlying sleep apnea, she needs a home sleep study to evaluate  - Discussed with patient that untreated sleep apnea puts patient at a higher risk for cardiovascular disease, cardiac arrhythmias, pulmonary hypertension, diabetes, stroke and increase in daytime accidents. We reviewed treatment options including weight loss, side sleeping position, oral appliance, CPAP or referral to ENT for possible surgical interventions - Advised patient not to drive if experiencing excessive daytime fatigue or somnolence - FU in 4 weeks after sleep study to review result and treatment options

## 2020-02-18 NOTE — Progress Notes (Signed)
[place]  [phone] [phone]  Patient Care Team: [name], [name], Harrell Gave, [name] Rico Junker, RN as Registered Nurse Grayland Ormond Kathlene November, MD as Consulting Physician (Oncology) Bary Castilla Forest Gleason, MD as Consulting Physician (General Surgery) Bary Castilla Forest Gleason, MD as Consulting Physician (General Surgery) Noreene Filbert, MD as Referring Physician (Radiation Oncology) Jeral Fruit, RN as Registered Nurse   Name of the patient: Alicia [name]  [id number]  1950-12-15   Date of visit: 02/18/20  HPI: Patient is a 70 y.o. female with recurrent stage IV ER/PR positive, HER2 negative breast cancer. Patient started on Ibrance on 07/09/19.  Reason for Consult: Oral chemotherapy follow-up for Ibrance (palbociclib) therapy, s/p cycle 8.   PAST MEDICAL HISTORY: Past Medical History:  Diagnosis Date  . Anxiety   . Cancer ([place]) 07/2017   left breast  . Depression   . Hypertension   . Hypothyroidism   . Personal history of chemotherapy 2019   LEFT mastectomy-chemo before  . Personal history of radiation therapy 03/2018   LEFT mastectomy  . Rapid heart rate   . Thyroid disease     PAST SURGICAL HISTORY:  Past Surgical History:  Procedure Laterality Date  . AXILLARY LYMPH NODE BIOPSY Left 07/16/2017   METASTATIC MAMMARY CARCINOMA  . BREAST BIOPSY Left 07/16/2017   [place] bx of left breast mass and left breast LN.  INVASIVE MAMMARY CARCINOMA, NO SPECIAL TYPE.   Marland Kitchen BREAST EXCISIONAL BIOPSY Right 2001   benign  . BREAST LUMPECTOMY WITH SENTINEL LYMPH NODE BIOPSY Left 12/06/2017   Procedure: BREAST LUMPECTOMY WITH SENTINEL LYMPH NODE BX;  Surgeon: Robert Bellow, MD;  Location: [facility] ORS;  Service: General;  Laterality: Left;  . COLONOSCOPY    . MASTECTOMY Left 12/23/2017  . PORTACATH PLACEMENT Right 08/07/2017   Procedure:  INSERTION PORT-A-CATH;  Surgeon: Robert Bellow, MD;  Location: [facility] ORS;  Service: General;  Laterality: Right;  . SIMPLE MASTECTOMY WITH AXILLARY SENTINEL NODE BIOPSY Left 12/23/2017   T2,N2 with 6/7 nodes positive. Whole breast radiation.  Surgeon: Robert Bellow, MD;  Location: [facility] ORS;  Service: General;  Laterality: Left;  . TONSILLECTOMY      HEMATOLOGY/ONCOLOGY HISTORY:  Oncology History Overview Note  Patient is a 70 year old female who recently self palpated a mass on her left breast.  Subsequent imaging and biopsy revealed the above-stated breast cancer.  Case was also discussed extensively at case conference.  Given the size and the stage of patient's malignancy, she will benefit from neoadjuvant chemotherapy using Adriamycin, Cytoxan, and Taxol.  Patient will also require Neulasta support.  Will get CT scan of the chest, abdomen, and pelvis to assess for any metastatic disease.  Patient will also require port placement and MUGA prior to initiating treatment  CT abdomen/pelvis/chest did not reveal any suspicious lesions concerning for metastatic disease. (08/01/17)  Port-A-Cath placed on 08/07/2017.  Cycle 1 day 1 of AC was given on 08/08/17.     Breast cancer, stage 2, left ([place])  07/24/2017 Initial Diagnosis   Breast cancer, stage 2, left ([place])   07/30/2017 Cancer Staging   Staging form: Breast, AJCC 8th Edition - Clinical stage from 07/30/2017: Stage IIA (cT2, cN1, cM0, G2, ER+, PR+, HER2-) - Signed by Lloyd Huger, MD on 07/30/2017   08/08/2017 - 10/31/2017 Chemotherapy   The patient had DOXOrubicin (ADRIAMYCIN) chemo injection 130 mg, 60 mg/m2 = 130  mg, Intravenous,  Once, 4 of 4 cycles Administration: 130 mg (08/08/2017), 130 mg (08/22/2017), 130 mg (09/05/2017), 130 mg (09/19/2017) palonosetron (ALOXI) injection 0.25 mg, 0.25 mg, Intravenous,  Once, 4 of 4 cycles Administration: 0.25 mg (08/08/2017), 0.25 mg (08/22/2017), 0.25 mg (09/05/2017), 0.25 mg  (09/19/2017) pegfilgrastim-cbqv (UDENYCA) injection 6 mg, 6 mg, Subcutaneous, Once, 4 of 4 cycles Administration: 6 mg (08/09/2017), 6 mg (08/23/2017), 6 mg (09/06/2017), 6 mg (09/20/2017) cyclophosphamide (CYTOXAN) 1,300 mg in sodium chloride 0.9 % 250 mL chemo infusion, 600 mg/m2 = 1,300 mg, Intravenous,  Once, 4 of 4 cycles Administration: 1,300 mg (08/08/2017), 1,300 mg (08/22/2017), 1,300 mg (09/05/2017), 1,300 mg (09/19/2017) PACLitaxel (TAXOL) 174 mg in sodium chloride 0.9 % 250 mL chemo infusion (</= 73m/m2), 80 mg/m2 = 174 mg, Intravenous,  Once, 4 of 12 cycles Dose modification: 72 mg/m2 (original dose 80 mg/m2, Cycle 6, Reason: Dose not tolerated) Administration: 174 mg (10/03/2017), 156 mg (10/17/2017), 156 mg (10/24/2017), 156 mg (10/31/2017) fosaprepitant (EMEND) 150 mg, dexamethasone (DECADRON) 12 mg in sodium chloride 0.9 % 145 mL IVPB, , Intravenous,  Once, 4 of 4 cycles Administration:  (08/08/2017),  (08/22/2017),  (09/05/2017),  (09/19/2017)  for chemotherapy treatment.      ALLERGIES:  is allergic to sulfa antibiotics.  MEDICATIONS:  Current Outpatient Medications  Medication Sig Dispense Refill  . acetaminophen (TYLENOL) 500 MG tablet Take 1,000 mg by mouth every 6 (six) hours as needed for moderate pain or headache.     . albuterol (PROVENTIL HFA;VENTOLIN HFA) 108 (90 Base) MCG/ACT inhaler Inhale 2 puffs into the lungs every 6 (six) hours as needed for wheezing or shortness of breath. 1 Inhaler 2  . ALPRAZolam (XANAX) 0.5 MG tablet Take 1 tablet (0.5 mg total) by mouth 2 (two) times daily. 60 tablet 0  . amLODipine (NORVASC) 5 MG tablet TAKE 1 TABLET BY MOUTH DAILY 90 tablet 3  . aspirin EC 81 MG tablet Take 81 mg by mouth at bedtime.     . calcium carbonate (OS-CAL - DOSED IN MG OF ELEMENTAL CALCIUM) 1250 (500 Ca) MG tablet Take 1 tablet by mouth daily with breakfast.    . Cholecalciferol (VITAMIN D3) 125 MCG (5000 UT) CAPS Take 1 capsule by mouth 2 (two) times a day.    . docusate  sodium (COLACE) 100 MG capsule Take 100 mg by mouth 3 (three) times daily as needed for mild constipation.    . hydrochlorothiazide (HYDRODIURIL) 12.5 MG tablet Take 1 tablet (12.5 mg total) by mouth daily. 90 tablet 3  . levothyroxine (SYNTHROID) 100 MCG tablet TAKE 1 TABLET BY MOUTH DAILY 90 tablet 3  . losartan (COZAAR) 100 MG tablet Take 1 tablet (100 mg total) by mouth daily. 90 tablet 3  . ondansetron (ZOFRAN) 8 MG tablet Take 1 tablet (8 mg total) by mouth every 8 (eight) hours as needed for nausea or vomiting. (Patient not taking: Reported on 02/18/2020) 20 tablet 0  . palbociclib (IBRANCE) 125 MG tablet Take 1 tablet (125 mg total) by mouth daily. Take for 21 days on, 7 days off, repeat every 28 days. 21 tablet 3  . rosuvastatin (CRESTOR) 5 MG tablet TAKE 1 TABLET BY MOUTH DAILY 30 tablet 6  . traZODone (DESYREL) 50 MG tablet TAKE 1 TABLET BY MOUTH DAILY 30 tablet 6   No current facility-administered medications for this visit.    VITAL SIGNS: There were no vitals taken for this visit. There were no vitals filed for this visit.  Estimated body mass index  is 36.48 kg/m as calculated from the following:   Height as of 02/17/20: 5' 6" (1.676 m).   Weight as of an earlier encounter on 02/18/20: 102.5 kg (226 lb).  LABS: CBC:    Component Value Date/Time   WBC 1.9 (L) 02/18/2020 1309   HGB 11.1 (L) 02/18/2020 1309   HGB 14.2 02/11/2011 1349   HCT 31.3 (L) 02/18/2020 1309   HCT 40.9 02/11/2011 1349   PLT 160 02/18/2020 1309   PLT 151 02/11/2011 1349   MCV 100.0 02/18/2020 1309   MCV 89 02/11/2011 1349   NEUTROABS 0.9 (L) 02/18/2020 1309   LYMPHSABS 0.5 (L) 02/18/2020 1309   MONOABS 0.5 02/18/2020 1309   EOSABS 0.0 02/18/2020 1309   BASOSABS 0.0 02/18/2020 1309   Comprehensive Metabolic Panel:    Component Value Date/Time   NA 138 02/18/2020 1309   NA 142 02/11/2011 1349   K 4.2 02/18/2020 1309   K 3.8 02/11/2011 1349   CL 103 02/18/2020 1309   CL 107 02/11/2011 1349   CO2  26 02/18/2020 1309   CO2 24 02/11/2011 1349   BUN 17 02/18/2020 1309   BUN 14 02/11/2011 1349   CREATININE 1.11 (H) 02/18/2020 1309   CREATININE 0.65 02/11/2011 1349   GLUCOSE 106 (H) 02/18/2020 1309   GLUCOSE 98 02/11/2011 1349   CALCIUM 9.4 02/18/2020 1309   CALCIUM 9.0 02/11/2011 1349   AST 20 02/18/2020 1309   AST 27 02/11/2011 1349   ALT 15 02/18/2020 1309   ALT 36 02/11/2011 1349   ALKPHOS 68 02/18/2020 1309   ALKPHOS 122 02/11/2011 1349   BILITOT 0.6 02/18/2020 1309   BILITOT 0.5 02/11/2011 1349   PROT 7.1 02/18/2020 1309   PROT 7.0 02/11/2011 1349   ALBUMIN 4.1 02/18/2020 1309   ALBUMIN 4.1 02/11/2011 1349    RADIOGRAPHIC STUDIES: DG Elbow 2 Views Left  Result Date: 02/11/2020 CLINICAL DATA:  Left elbow pain for several days, history of metastatic breast cancer EXAM: LEFT ELBOW - 2 VIEW COMPARISON:  06/30/2019 FINDINGS: Frontal and lateral views of the left elbow are obtained. There are no acute or destructive bony lesions. Mild osteoarthritis, with spurring of the radial head. No joint effusion. Soft tissues are unremarkable. IMPRESSION: 1. Mild osteoarthritis.  No acute or destructive bony lesions. Electronically Signed   By: Randa Ngo M.D.   On: 02/11/2020 16:10     Assessment and Plan-  Reviewed CBC and differential and will plan to continue Ibrance at 182m dosing for cycle 9. Will follow labs going forward to assess for dose adjustments at that time.    Oral Chemotherapy Side Effect/Intolerance:  - Constipation: her previously improved constipation has worsened. She is currently taking Miralax and Colace once daily with little relief. She has ~1 bowel movement every other day. She has been less active lately due to the cold weather recently. Recommended for her to initiate senna with one tablet at night and to increase to twice daily if needed. She can also increase Miralax to twice daily if she still needs relief.   - She has been more fatigued lately.  Encouraged her to increase her activity level as the weather warms up and she feels like she can be more active.   Oral Chemotherapy Adherence: No reported missed doses  Medication Access Issues: Ms. HWestley Chandlerhas enough medication on hand for this next cycle that she is starting. LIS is in process for medication acquisition going forward. In the meantime, she has two grants,  PAF and [employer], that can cover medication to be filled at [facility].   Patient expressed understanding and was in agreement with this plan. She also understands that She can call clinic at any time with any questions, concerns, or complaints.   Thank you for allowing me to participate in the care of this very pleasant patient.   Time Total: 15 mins  Visit consisted of counseling and education on dealing with issues of symptom management in the setting of serious and potentially life-threatening illness.Greater than 50%  of this time was spent counseling and coordinating care related to the above assessment and plan.  Visit completed by:  Eddie Candle, PharmD, BCPS PGY2 Hem/Onc Resident   Signed by: Darl Pikes, PharmD, BCPS, Salley Slaughter, CPP Hematology/Oncology Clinical Pharmacist Practitioner [facility] [phone]  02/18/2020 3:29 PM

## 2020-02-18 NOTE — Progress Notes (Signed)
Reviewed and agree with assessment/plan.   Chesley Mires, MD [facility] Pulmonary/Critical Care 02/18/2020, 8:53 AM Pager:  [phone]

## 2020-02-18 NOTE — Progress Notes (Signed)
Pt reports doing well, but having chills often without fever.

## 2020-02-19 ENCOUNTER — Encounter: Payer: Self-pay | Admitting: Internal Medicine

## 2020-02-19 ENCOUNTER — Other Ambulatory Visit: Payer: Self-pay

## 2020-02-19 ENCOUNTER — Ambulatory Visit (INDEPENDENT_AMBULATORY_CARE_PROVIDER_SITE_OTHER): Payer: Medicare Other | Admitting: Internal Medicine

## 2020-02-19 VITALS — BP 120/60 | HR 58 | Ht 66.0 in | Wt 227.2 lb

## 2020-02-19 DIAGNOSIS — R002 Palpitations: Secondary | ICD-10-CM

## 2020-02-19 DIAGNOSIS — R931 Abnormal findings on diagnostic imaging of heart and coronary circulation: Secondary | ICD-10-CM | POA: Diagnosis not present

## 2020-02-19 DIAGNOSIS — E7800 Pure hypercholesterolemia, unspecified: Secondary | ICD-10-CM

## 2020-02-19 DIAGNOSIS — I471 Supraventricular tachycardia: Secondary | ICD-10-CM

## 2020-02-19 DIAGNOSIS — I441 Atrioventricular block, second degree: Secondary | ICD-10-CM

## 2020-02-19 DIAGNOSIS — I10 Essential (primary) hypertension: Secondary | ICD-10-CM

## 2020-02-19 DIAGNOSIS — R079 Chest pain, unspecified: Secondary | ICD-10-CM

## 2020-02-19 DIAGNOSIS — I208 Other forms of angina pectoris: Secondary | ICD-10-CM

## 2020-02-19 LAB — CANCER ANTIGEN 27.29: CA 27.29: 47.5 U/mL — ABNORMAL HIGH (ref 0.0–38.6)

## 2020-02-19 MED ORDER — METOPROLOL TARTRATE 25 MG PO TABS: 25.0000 mg | ORAL_TABLET | Freq: Once | ORAL | 0 refills | Status: DC

## 2020-02-19 NOTE — Progress Notes (Signed)
Follow-up Outpatient Visit Date: 02/19/2020  Primary Care Provider: [name], [address] [place] [address]  Chief Complaint: Palpitations and chest pain  HPI:  Alicia [name] is a 70 y.o. female with history of remote pulmonary embolism, left breast cancer status postmastectomy and chemotherapy, hypertension, and thyroid disease, who presents for follow-up of chest pain and palpitations.  I met her in late October, 2021, following ED visit for evaluation of chest pain and palpitations.  Subsequent echo showed mild to moderate MR and mild AI.  She was seen in follow-up by Christell Faith, PA, in late November.  She denied further chest pain but continued to have intermittent palpitations prompting placement of an event monitor.  This was notable for rare PACs and PVCs as well as brief episodes of PSVT.  Transient Mobitz type I second-degree AV block was observed.  Alicia [name] was referred to pulmonology for evaluation of possible sleep apnea.  Today, Alicia [name] reports that she continues to have sporadic palpitations that typically last a few seconds at a time.  They happen about once a week and are without associated symptoms.  She notes occasional left-sided chest pain that she describes as "stinging and burning."  She wonders if it may be related to her mastectomy, though at times it seems to occur with exertion.  She denies shortness of breath, lightheadedness, and edema.  She recently felt "heat" in her calves after walking a lot and is concerned that this could reflect poor circulation.  --------------------------------------------------------------------------------------------------  Cardiovascular History & Procedures: Cardiovascular Problems:  Palpitations  Chest pain  Risk Factors:  Hypertension and age greater than 73  Cath/PCI:  None  CV Surgery:  None  EP Procedures and Devices:  14-day event monitor (12/15/2019): Predominantly sinus rhythm with  rare PACs and PVCs.  Brief episodes of PSVT noted as well as transient Mobitz type I second-degree AV block.  Non-Invasive Evaluation(s):  TTE (12/03/2019): Normal LV size and wall thickness.  LVEF 55-60% with normal diastolic function.  Normal RV size and function.  Mild to moderate mitral regurgitation.  Mild aortic regurgitation.  TTE (03/03/2015, Dr. Humphrey Rolls): Normal LV size and systolic function with grade 1 diastolic dysfunction.  Mild left atrial enlargement.  Normal RV size and function.  Mild to moderate mitral and aortic regurgitation.  Mild to moderate pulmonic regurgitation.  Mild tricuspid regurgitation.  Cardiac CTA (02/25/2015, Dr. Humphrey Rolls): Normal right dominant coronary arteries.  Coronary calcium score = 0.   Recent CV Pertinent Labs: Lab Results  Component Value Date   CHOL 202 (H) 12/15/2019   HDL 104 12/15/2019   LDLCALC 87 12/15/2019   LDLDIRECT 76 12/15/2019   TRIG 60 12/15/2019   CHOLHDL 1.9 12/15/2019   CHOLHDL 2.3 02/18/2015   INR 0.97 01/10/2018   INR 0.9 02/11/2011   K 4.2 02/18/2020   K 3.8 02/11/2011   MG 1.8 10/27/2019   BUN 17 02/18/2020   BUN 14 02/11/2011   CREATININE 1.11 (H) 02/18/2020   CREATININE 0.65 02/11/2011    Past medical and surgical history were reviewed and updated in EPIC.  Current Meds  Medication Sig  . acetaminophen (TYLENOL) 500 MG tablet Take 1,000 mg by mouth every 6 (six) hours as needed for moderate pain or headache.   . ALPRAZolam (XANAX) 0.5 MG tablet Take 1 tablet (0.5 mg total) by mouth 2 (two) times daily.  Marland Kitchen amLODipine (NORVASC) 5 MG tablet TAKE 1 TABLET BY MOUTH DAILY  . aspirin EC 81 MG tablet  Take 81 mg by mouth at bedtime.   . calcium carbonate (OS-CAL - DOSED IN MG OF ELEMENTAL CALCIUM) 1250 (500 Ca) MG tablet Take 1 tablet by mouth daily with breakfast.  . Cholecalciferol (VITAMIN D3) 125 MCG (5000 UT) CAPS Take 1 capsule by mouth 2 (two) times a day.  . docusate sodium (COLACE) 100 MG capsule Take 100 mg by mouth  3 (three) times daily as needed for mild constipation.  . hydrochlorothiazide (HYDRODIURIL) 12.5 MG tablet Take 1 tablet (12.5 mg total) by mouth daily.  Marland Kitchen levothyroxine (SYNTHROID) 100 MCG tablet TAKE 1 TABLET BY MOUTH DAILY  . losartan (COZAAR) 100 MG tablet Take 1 tablet (100 mg total) by mouth daily.  . ondansetron (ZOFRAN) 8 MG tablet Take 1 tablet (8 mg total) by mouth every 8 (eight) hours as needed for nausea or vomiting.  . palbociclib (IBRANCE) 125 MG tablet Take 1 tablet (125 mg total) by mouth daily. Take for 21 days on, 7 days off, repeat every 28 days.  . rosuvastatin (CRESTOR) 5 MG tablet TAKE 1 TABLET BY MOUTH DAILY  . traZODone (DESYREL) 50 MG tablet TAKE 1 TABLET BY MOUTH DAILY    Allergies: Sulfa antibiotics  Social History   Tobacco Use  . Smoking status: Former Smoker    Packs/day: 1.00    Years: 30.00    Pack years: 30.00    Types: Cigarettes    Quit date: 2019    Years since quitting: 3.0  . Smokeless tobacco: Never Used  Vaping Use  . Vaping Use: Never used  Substance Use Topics  . Alcohol use: Not Currently  . Drug use: Never    Family History  Problem Relation Age of Onset  . Stroke Mother   . Thyroid disease Mother   . Renal Disease Mother   . Stroke Father   . Heart attack Father   . Sudden death Father 20       suicide  . Anuerysm Brother   . Breast cancer Neg Hx     Review of Systems: A 12-system review of systems was performed and was negative except as noted in the HPI.  --------------------------------------------------------------------------------------------------  Physical Exam: BP 120/60 (BP Location: Left Arm, Patient Position: Sitting, Cuff Size: Large)   Pulse (!) 58   Ht 5' 6"  (1.676 m)   Wt 227 lb 4 oz (103.1 kg)   SpO2 97%   BMI 36.68 kg/m   General:  NAD. Neck: No JVD or HJR. Lungs: Clear to auscultation bilaterally without wheezes or crackles. Heart: Bradycardic but regular without murmurs, rubs, or  gallops. Abdomen: Soft, nontender, nondistended. Extremities: No lower extremity edema.  2+ posterior tibial and dorsalis pedis pulses bilaterally.  EKG:  Sinus bradycardia with 1st degree AV block (PR interval 236 ms).  PR interval has lengthened slightly since 11/12/2019.  Otherwise, no significant change since 11/12/2019.  Lab Results  Component Value Date   WBC 1.9 (L) 02/18/2020   HGB 11.1 (L) 02/18/2020   HCT 31.3 (L) 02/18/2020   MCV 100.0 02/18/2020   PLT 160 02/18/2020    Lab Results  Component Value Date   NA 138 02/18/2020   K 4.2 02/18/2020   CL 103 02/18/2020   CO2 26 02/18/2020   BUN 17 02/18/2020   CREATININE 1.11 (H) 02/18/2020   GLUCOSE 106 (H) 02/18/2020   ALT 15 02/18/2020    Lab Results  Component Value Date   CHOL 202 (H) 12/15/2019   HDL 104 12/15/2019  LDLCALC 87 12/15/2019   LDLDIRECT 76 12/15/2019   TRIG 60 12/15/2019   CHOLHDL 1.9 12/15/2019    --------------------------------------------------------------------------------------------------  ASSESSMENT AND PLAN: Palpitations, PSVT, and Mobitz type 1 second degree AV block: I suspect sporadic palpitations are due to PAC"s, PAC's, and brief PSVT noted on prior event monitor.  Given relatively infrequent nature of palpitations and lack of associated symptoms, we will defer additional intervention at this time.  I am also reluctant to add a beta blocker or nondihydropyridine calcium channel blocker in the setting of 1st degree AV block on today's EKG and recent event monitor showing episodes of Mobitz type 1 second degree AV block.  Sleep evaluation is ongoing to exclude underlying OSA as a contributor to nocturnal heart block.  Chest pain: Pain is atypical though sometimes associated with exertion.  Cardiac CTA in 2017 was normal.  However, given recurrence of symptoms, we have agreed to repeat a cardiac CTA at her convenience.  Hypertension: Blood pressure well-controlled.  No medication  changes today.  Aortic atherosclerosis and hperlipidema: Incidentally noted on CTA chest during ED visit in 10/2019.  Continue aspirin and rosuvastatin; LDL reasonable at 76 on last check in 11/2019.  If there is evidence of significant CAD on CTA, dose escalation for target LDL < 70 will need to be considered.  Follow-up: Return to clinic in 6 months.  Nelva Bush, MD 02/19/2020 4:44 PM

## 2020-02-19 NOTE — Patient Instructions (Addendum)
Medication Instructions:  Your physician recommends that you continue on your current medications as directed. Please refer to the Current Medication list given to you today.  *If you need a refill on your cardiac medications before your next appointment, please call your pharmacy*   Lab Work: none If you have labs (blood work) drawn today and your tests are completely normal, you will receive your results only by: Marland Kitchen MyChart Message (if you have MyChart) OR . A paper copy in the mail If you have any lab test that is abnormal or we need to change your treatment, we will call you to review the results.   Testing/Procedures:  Your cardiac CT will be scheduled at one of the below locations:   [facility] [address] [place], [place] [address] [phone]  [facility] [address] [place], [place] [address] [phone]  If scheduled at [facility], please arrive at the [facility] main entrance of [facility] 30 minutes prior to test start time. Proceed to the [facility] Radiology Department (first floor) to check-in and test prep.  If scheduled at [facility], please arrive 15 mins early for check-in and test prep.  Please follow these instructions carefully (unless otherwise directed):  On the Night Before the Test: . Be sure to Drink plenty of water. . Do not consume any caffeinated/decaffeinated beverages or chocolate 12 hours prior to your test. . Do not take any antihistamines 12 hours prior to your test.  On the Day of the Test: . Drink plenty of water. Do not drink any water within one hour of the test. . Do not eat any food 4 hours prior to the test. . You may take your regular medications prior to the test.  . Take metoprolol (Lopressor) two hours prior to test.- Lopressor 25 mg  . HOLD Furosemide/Hydrochlorothiazide morning of the test. . FEMALES-  please wear underwire-free bra if available      After the Test: . Drink plenty of water. . After receiving IV contrast, you may experience a mild flushed feeling. This is normal. . On occasion, you may experience a mild rash up to 24 hours after the test. This is not dangerous. If this occurs, you can take Benadryl 25 mg and increase your fluid intake. . If you experience trouble breathing, this can be serious. If it is severe call 911 IMMEDIATELY. If it is mild, please call our office. . If you take any of these medications: Glipizide/Metformin, Avandament, Glucavance, please do not take 48 hours after completing test unless otherwise instructed.   Once we have confirmed authorization from your insurance company, we will call you to set up a date and time for your test. Based on how quickly your insurance processes prior authorizations requests, please allow up to 4 weeks to be contacted for scheduling your Cardiac CT appointment. Be advised that routine Cardiac CT appointments could be scheduled as many as 8 weeks after your provider has ordered it.  For non-scheduling related questions, please contact the cardiac imaging nurse navigator should you have any questions/concerns: Marchia Bond, Cardiac Imaging Nurse Navigator Burley Saver, Interim Cardiac Imaging Nurse [place] [facility] Services Direct Office Dial: [phone]   For scheduling needs, including cancellations and rescheduling, please call [place], [phone].    Follow-Up: At [facility], you and your health needs are our priority.  As part of our continuing mission to provide  you with exceptional heart care, we have created designated Provider Care Teams.  These Care Teams include your primary Cardiologist (physician) and Advanced Practice Providers (APPs -  Physician Assistants and Nurse Practitioners) who all work together to provide you with the care you need, when you need it.  We recommend  signing up for the patient portal called "MyChart".  Sign up information is provided on this After Visit Summary.  MyChart is used to connect with patients for Virtual Visits (Telemedicine).  Patients are able to view lab/test results, encounter notes, upcoming appointments, etc.  Non-urgent messages can be sent to your provider as well.   To learn more about what you can do with MyChart, go to [email or web address].    Your next appointment:   6 month(s)  The format for your next appointment:   In Person  Provider:   You may see Nelva Bush, MD or one of the following Advanced Practice Providers on your designated Care Team:    Murray Hodgkins, NP  Christell Faith, PA-C  Marrianne Mood, PA-C  Cadence [place], [place]  Laurann Montana, NP    Cardiac CT Angiogram A cardiac CT angiogram is a procedure to look at the heart and the area around the heart. It may be done to help find the cause of chest pains or other symptoms of heart disease. During this procedure, a substance called contrast dye is injected into the blood vessels in the area to be checked. A large X-ray machine, called a CT scanner, then takes detailed pictures of the heart and the surrounding area. The procedure is also sometimes called a coronary CT angiogram, coronary artery scanning, or CTA. A cardiac CT angiogram allows the health care provider to see how well blood is flowing to and from the heart. The health care provider will be able to see if there are any problems, such as:  Blockage or narrowing of the coronary arteries in the heart.  Fluid around the heart.  Signs of weakness or disease in the muscles, valves, and tissues of the heart. Tell a health care provider about:  Any allergies you have. This is especially important if you have had a previous allergic reaction to contrast dye.  All medicines you are taking, including vitamins, herbs, eye drops, creams, and over-the-counter medicines.  Any blood  disorders you have.  Any surgeries you have had.  Any medical conditions you have.  Whether you are pregnant or may be pregnant.  Any anxiety disorders, chronic pain, or other conditions you have that may increase your stress or prevent you from lying still. What are the risks? Generally, this is a safe procedure. However, problems may occur, including:  Bleeding.  Infection.  Allergic reactions to medicines or dyes.  Damage to other structures or organs.  Kidney damage from the contrast dye that is used.  Increased risk of cancer from radiation exposure. This risk is low. Talk with your health care provider about: ? The risks and benefits of testing. ? How you can receive the lowest dose of radiation. What happens before the procedure?  Wear comfortable clothing and remove any jewelry, glasses, dentures, and hearing aids.  Follow instructions from your health care provider about eating and drinking. This may include: ? For 12 hours before the procedure - avoid caffeine. This includes tea, coffee, soda, energy drinks, and diet pills. Drink plenty of water or other fluids that do not have caffeine in them. Being well hydrated can prevent complications. ? For 4-6 hours  before the procedure - stop eating and drinking. The contrast dye can cause nausea, but this is less likely if your stomach is empty.  Ask your health care provider about changing or stopping your regular medicines. This is especially important if you are taking diabetes medicines, blood thinners, or medicines to treat problems with erections (erectile dysfunction). What happens during the procedure?  Hair on your chest may need to be removed so that small sticky patches called electrodes can be placed on your chest. These will transmit information that helps to monitor your heart during the procedure.  An IV will be inserted into one of your veins.  You might be given a medicine to control your heart rate during  the procedure. This will help to ensure that good images are obtained.  You will be asked to lie on an exam table. This table will slide in and out of the CT machine during the procedure.  Contrast dye will be injected into the IV. You might feel warm, or you may get a metallic taste in your mouth.  You will be given a medicine called nitroglycerin. This will relax or dilate the arteries in your heart.  The table that you are lying on will move into the CT machine tunnel for the scan.  The person running the machine will give you instructions while the scans are being done. You may be asked to: ? Keep your arms above your head. ? Hold your breath. ? Stay very still, even if the table is moving.  When the scanning is complete, you will be moved out of the machine.  The IV will be removed. The procedure may vary among health care providers and hospitals.   What can I expect after the procedure? After your procedure, it is common to have:  A metallic taste in your mouth from the contrast dye.  A feeling of warmth.  A headache from the nitroglycerin. Follow these instructions at home:  Take over-the-counter and prescription medicines only as told by your health care provider.  If you are told, drink enough fluid to keep your urine pale yellow. This will help to flush the contrast dye out of your body.  Most people can return to their normal activities right after the procedure. Ask your health care provider what activities are safe for you.  It is up to you to get the results of your procedure. Ask your health care provider, or the department that is doing the procedure, when your results will be ready.  Keep all follow-up visits as told by your health care provider. This is important. Contact a health care provider if:  You have any symptoms of allergy to the contrast dye. These include: ? Shortness of breath. ? Rash or hives. ? A racing heartbeat. Summary  A cardiac CT  angiogram is a procedure to look at the heart and the area around the heart. It may be done to help find the cause of chest pains or other symptoms of heart disease.  During this procedure, a large X-ray machine, called a CT scanner, takes detailed pictures of the heart and the surrounding area after a contrast dye has been injected into blood vessels in the area.  Ask your health care provider about changing or stopping your regular medicines before the procedure. This is especially important if you are taking diabetes medicines, blood thinners, or medicines to treat erectile dysfunction.  If you are told, drink enough fluid to keep your urine pale  yellow. This will help to flush the contrast dye out of your body. This information is not intended to replace advice given to you by your health care provider. Make sure you discuss any questions you have with your health care provider. Document Revised: 08/27/2018 Document Reviewed: 08/27/2018 Elsevier Patient Education  2021 [employer].

## 2020-02-19 NOTE — Telephone Encounter (Signed)
Oral Oncology Patient Advocate Encounter  Met with Alicia [name] after her appointment on 02/19/20 to submit Medicare Extra Help for Prescriptions application.  [employer] is requiring patients to submit an application to see if they qualify for extra help that will reduce prescription copays before they can renew with them.   Application was submitted and can take up to 3-4 months for a determination.  Patient is aware and she has grant funding this year to help with the cost of her [name].   I instructed Alicia [name] if she is receives a denial letter she is to save it and bring to the office so we can put it in [place].  I will fax a copy to [place] so they know she was denied and they can continue to help her if needed.  [place] Patient [place] Phone [phone] Fax [phone] 02/19/2020 8:33 AM

## 2020-02-20 ENCOUNTER — Encounter: Payer: Self-pay | Admitting: Internal Medicine

## 2020-02-20 ENCOUNTER — Other Ambulatory Visit: Payer: Self-pay | Admitting: Internal Medicine

## 2020-02-20 DIAGNOSIS — I471 Supraventricular tachycardia: Secondary | ICD-10-CM | POA: Insufficient documentation

## 2020-02-20 DIAGNOSIS — I441 Atrioventricular block, second degree: Secondary | ICD-10-CM | POA: Insufficient documentation

## 2020-02-20 DIAGNOSIS — E7800 Pure hypercholesterolemia, unspecified: Secondary | ICD-10-CM | POA: Insufficient documentation

## 2020-02-21 ENCOUNTER — Other Ambulatory Visit: Payer: Self-pay | Admitting: Internal Medicine

## 2020-02-22 ENCOUNTER — Other Ambulatory Visit: Payer: Self-pay | Admitting: Internal Medicine

## 2020-03-01 ENCOUNTER — Telehealth (HOSPITAL_COMMUNITY): Payer: Self-pay | Admitting: *Deleted

## 2020-03-01 NOTE — Telephone Encounter (Signed)
Reaching out to patient to offer assistance regarding upcoming cardiac imaging study; pt verbalizes understanding of appt date/time, parking situation and where to check in, pre-test NPO status and medications ordered, and verified current allergies; name and call back number provided for further questions should they arise  Avni Traore RN Navigator Cardiac Imaging [facility] [phone] office [phone] cell  

## 2020-03-02 ENCOUNTER — Ambulatory Visit: Payer: Medicare Other

## 2020-03-02 ENCOUNTER — Other Ambulatory Visit: Payer: Self-pay

## 2020-03-02 DIAGNOSIS — R400 Somnolence: Secondary | ICD-10-CM

## 2020-03-02 DIAGNOSIS — G4733 Obstructive sleep apnea (adult) (pediatric): Secondary | ICD-10-CM | POA: Diagnosis not present

## 2020-03-03 ENCOUNTER — Other Ambulatory Visit: Payer: Self-pay | Admitting: Internal Medicine

## 2020-03-03 ENCOUNTER — Ambulatory Visit: Admission: RE | Admit: 2020-03-03 | Payer: Medicare Other | Source: Ambulatory Visit

## 2020-03-04 ENCOUNTER — Other Ambulatory Visit: Payer: Self-pay

## 2020-03-04 ENCOUNTER — Ambulatory Visit (INDEPENDENT_AMBULATORY_CARE_PROVIDER_SITE_OTHER): Payer: Medicare Other | Admitting: Family Medicine

## 2020-03-04 ENCOUNTER — Encounter: Payer: Self-pay | Admitting: Family Medicine

## 2020-03-04 VITALS — BP 138/78 | HR 80 | Ht 66.0 in | Wt 229.1 lb

## 2020-03-04 DIAGNOSIS — E669 Obesity, unspecified: Secondary | ICD-10-CM

## 2020-03-04 DIAGNOSIS — F419 Anxiety disorder, unspecified: Secondary | ICD-10-CM | POA: Diagnosis not present

## 2020-03-04 DIAGNOSIS — I10 Essential (primary) hypertension: Secondary | ICD-10-CM

## 2020-03-04 MED ORDER — ALPRAZOLAM 0.5 MG PO TABS: 0.5000 mg | ORAL_TABLET | Freq: Two times a day (BID) | ORAL | 0 refills | Status: DC

## 2020-03-04 NOTE — Assessment & Plan Note (Signed)
Patient's blood pressure is within the desired range. Medication side effects include: no side effects noted Continue current treatment regimen. On no meds for BP and needs none at this time.

## 2020-03-04 NOTE — Progress Notes (Signed)
Established Patient Office Visit  SUBJECTIVE:  Subjective  Patient ID: Alicia [name], female    DOB: September 22, 1950  Age: 70 y.o. MRN: [id number]  CC:  Chief Complaint  Patient presents with  . Anxiety    Patient here for refill of xanax     HPI Alicia [name] is a 70 y.o. female presenting today for     Past Medical History:  Diagnosis Date  . Anxiety   . Cancer ([place]) 07/2017   left breast  . Depression   . Hypertension   . Hypothyroidism   . Personal history of chemotherapy 2019   LEFT mastectomy-chemo before  . Personal history of radiation therapy 03/2018   LEFT mastectomy  . Rapid heart rate   . Thyroid disease     Past Surgical History:  Procedure Laterality Date  . AXILLARY LYMPH NODE BIOPSY Left 07/16/2017   METASTATIC MAMMARY CARCINOMA  . BREAST BIOPSY Left 07/16/2017   [place] bx of left breast mass and left breast LN.  INVASIVE MAMMARY CARCINOMA, NO SPECIAL TYPE.   Marland Kitchen BREAST EXCISIONAL BIOPSY Right 2001   benign  . BREAST LUMPECTOMY WITH SENTINEL LYMPH NODE BIOPSY Left 12/06/2017   Procedure: BREAST LUMPECTOMY WITH SENTINEL LYMPH NODE BX;  Surgeon: Robert Bellow, MD;  Location: [facility] ORS;  Service: General;  Laterality: Left;  . COLONOSCOPY    . MASTECTOMY Left 12/23/2017  . PORTACATH PLACEMENT Right 08/07/2017   Procedure: INSERTION PORT-A-CATH;  Surgeon: Robert Bellow, MD;  Location: [facility] ORS;  Service: General;  Laterality: Right;  . SIMPLE MASTECTOMY WITH AXILLARY SENTINEL NODE BIOPSY Left 12/23/2017   T2,N2 with 6/7 nodes positive. Whole breast radiation.  Surgeon: Robert Bellow, MD;  Location: [facility] ORS;  Service: General;  Laterality: Left;  . TONSILLECTOMY      Family History  Problem Relation Age of Onset  . Stroke Mother   . Thyroid disease Mother   . Renal Disease Mother   . Stroke Father   . Heart attack Father   . Sudden death Father 42       suicide  . Anuerysm Brother   . Breast cancer Neg Hx     Social History    Socioeconomic History  . Marital status: Widowed    Spouse name: Not on file  . Number of children: Not on file  . Years of education: Not on file  . Highest education level: Not on file  Occupational History  . Not on file  Tobacco Use  . Smoking status: Former Smoker    Packs/day: 1.00    Years: 30.00    Pack years: 30.00    Types: Cigarettes    Quit date: 2019    Years since quitting: 3.1  . Smokeless tobacco: Never Used  Vaping Use  . Vaping Use: Never used  Substance and Sexual Activity  . Alcohol use: Not Currently  . Drug use: Never  . Sexual activity: Not Currently  Other Topics Concern  . Not on file  Social History Narrative  . Not on file   Social Determinants of Health   Financial Resource Strain: Not on file  Food Insecurity: Not on file  Transportation Needs: Not on file  Physical Activity: Not on file  Stress: Not on file  Social Connections: Not on file  Intimate Partner Violence: Not on file     Current Outpatient Medications:  .  acetaminophen (TYLENOL) 500 MG tablet, Take 1,000 mg by mouth every 6 (six) hours  as needed for moderate pain or headache. , Disp: , Rfl:  .  ALPRAZolam (XANAX) 0.5 MG tablet, Take 1 tablet (0.5 mg total) by mouth 2 (two) times daily., Disp: 60 tablet, Rfl: 0 .  amLODipine (NORVASC) 5 MG tablet, TAKE 1 TABLET BY MOUTH DAILY, Disp: 90 tablet, Rfl: 3 .  aspirin EC 81 MG tablet, Take 81 mg by mouth at bedtime. , Disp: , Rfl:  .  calcium carbonate (OS-CAL - DOSED IN MG OF ELEMENTAL CALCIUM) 1250 (500 Ca) MG tablet, Take 1 tablet by mouth daily with breakfast., Disp: , Rfl:  .  Cholecalciferol (VITAMIN D3) 125 MCG (5000 UT) CAPS, Take 1 capsule by mouth 2 (two) times a day., Disp: , Rfl:  .  docusate sodium (COLACE) 100 MG capsule, Take 100 mg by mouth 3 (three) times daily as needed for mild constipation., Disp: , Rfl:  .  hydrochlorothiazide (HYDRODIURIL) 12.5 MG tablet, Take 1 tablet (12.5 mg total) by mouth daily., Disp:  90 tablet, Rfl: 3 .  levothyroxine (SYNTHROID) 100 MCG tablet, TAKE 1 TABLET BY MOUTH DAILY, Disp: 90 tablet, Rfl: 3 .  losartan (COZAAR) 100 MG tablet, Take 1 tablet (100 mg total) by mouth daily., Disp: 90 tablet, Rfl: 3 .  metoprolol tartrate (LOPRESSOR) 25 MG tablet, Take 1 tablet (25 mg total) by mouth once for 1 dose. Take 2 hours prior to CTA., Disp: 1 tablet, Rfl: 0 .  ondansetron (ZOFRAN) 8 MG tablet, Take 1 tablet (8 mg total) by mouth every 8 (eight) hours as needed for nausea or vomiting., Disp: 20 tablet, Rfl: 0 .  palbociclib (IBRANCE) 125 MG tablet, Take 1 tablet (125 mg total) by mouth daily. Take for 21 days on, 7 days off, repeat every 28 days., Disp: 21 tablet, Rfl: 3 .  rosuvastatin (CRESTOR) 5 MG tablet, TAKE 1 TABLET BY MOUTH DAILY, Disp: 30 tablet, Rfl: 6 .  traZODone (DESYREL) 50 MG tablet, TAKE 1 TABLET BY MOUTH DAILY, Disp: 30 tablet, Rfl: 6   Allergies  Allergen Reactions  . Sulfa Antibiotics Diarrhea    ROS Review of Systems  Constitutional: Negative.   HENT: Negative.   Respiratory: Negative.   Cardiovascular: Negative.   Genitourinary: Negative.   Musculoskeletal: Negative.   Neurological: Negative.      OBJECTIVE:    Physical Exam Vitals and nursing note reviewed.  Constitutional:      Appearance: She is obese.  HENT:     Mouth/Throat:     Mouth: Mucous membranes are dry.  Pulmonary:     Effort: Pulmonary effort is normal.  Abdominal:     General: Abdomen is flat.  Musculoskeletal:        General: Normal range of motion.  Skin:    General: Skin is warm.  Neurological:     General: No focal deficit present.  Psychiatric:        Mood and Affect: Mood normal.     BP 138/78   Pulse 80   Ht 5\' 6"  (1.676 m)   Wt 229 lb 1.6 oz (103.9 kg)   BMI 36.98 kg/m  Wt Readings from Last 3 Encounters:  03/04/20 229 lb 1.6 oz (103.9 kg)  02/19/20 227 lb 4 oz (103.1 kg)  02/18/20 226 lb (102.5 kg)    Health Maintenance Due  Topic Date Due  .  Hepatitis C Screening  Never done  . TETANUS/TDAP  Never done  . COLONOSCOPY (Pts 45-63yrs Insurance coverage will need to be confirmed)  Never done  .  PNA vac Low Risk Adult (1 of 2 - PCV13) Never done    There are no preventive care reminders to display for this patient.  CBC Latest Ref Rng & Units 02/18/2020 01/21/2020 12/24/2019  WBC 4.0 - 10.5 K/uL 1.9(L) 2.3(L) 2.8(L)  Hemoglobin 12.0 - 15.0 g/dL 11.1(L) 11.9(L) 11.8(L)  Hematocrit 36.0 - 46.0 % 31.3(L) 33.5(L) 34.2(L)  Platelets 150 - 400 K/uL 160 150 147(L)   CMP Latest Ref Rng & Units 02/18/2020 01/21/2020 12/24/2019  Glucose 70 - 99 mg/dL 106(H) 103(H) 106(H)  BUN 8 - 23 mg/dL 17 19 17   Creatinine 0.44 - 1.00 mg/dL 1.11(H) 1.19(H) 1.00  Sodium 135 - 145 mmol/L 138 135 135  Potassium 3.5 - 5.1 mmol/L 4.2 3.7 3.9  Chloride 98 - 111 mmol/L 103 101 99  CO2 22 - 32 mmol/L 26 26 26   Calcium 8.9 - 10.3 mg/dL 9.4 9.2 10.1  Total Protein 6.5 - 8.1 g/dL 7.1 7.4 7.4  Total Bilirubin 0.3 - 1.2 mg/dL 0.6 0.9 0.9  Alkaline Phos 38 - 126 U/L 68 70 69  AST 15 - 41 U/L 20 21 20   ALT 0 - 44 U/L 15 17 19     Lab Results  Component Value Date   TSH 0.925 10/27/2019   Lab Results  Component Value Date   ALBUMIN 4.1 02/18/2020   ANIONGAP 9 02/18/2020   Lab Results  Component Value Date   CHOL 202 (H) 12/15/2019   CHOL 151 02/18/2015   HDL 104 12/15/2019   HDL 65 02/18/2015   LDLCALC 87 12/15/2019   LDLCALC 76 02/18/2015   CHOLHDL 1.9 12/15/2019   CHOLHDL 2.3 02/18/2015   Lab Results  Component Value Date   TRIG 60 12/15/2019   Lab Results  Component Value Date   HGBA1C 5.9 (H) 10/04/2017   HGBA1C 5.6 02/18/2015      ASSESSMENT & PLAN:   Problem List Items Addressed This Visit      Cardiovascular and Mediastinum   Primary hypertension     Other   Anxiety    Patient's blood pressure is within the desired range. Medication side effects include: no side effects noted Continue current treatment regimen. On no meds for BP  and needs none at this time.         Relevant Medications   ALPRAZolam (XANAX) 0.5 MG tablet   Obesity (BMI 35.0-39.9 without comorbidity) - Primary    Gained weight since last visit, she is working on motivation, discussed low carb diet and portion control.          Meds ordered this encounter  Medications  . ALPRAZolam (XANAX) 0.5 MG tablet    Sig: Take 1 tablet (0.5 mg total) by mouth 2 (two) times daily.    Dispense:  60 tablet    Refill:  0      Follow-up: No follow-ups on file.    Beckie Salts, [place] [address] [place], [place] [address]

## 2020-03-04 NOTE — Assessment & Plan Note (Signed)
Gained weight since last visit, she is working on motivation, discussed low carb diet and portion control.

## 2020-03-08 ENCOUNTER — Telehealth (HOSPITAL_COMMUNITY): Payer: Self-pay | Admitting: *Deleted

## 2020-03-08 IMAGING — DX DG CHEST 1V
1 series · 1 of 1 positions shown · non-contrast
Comparison: Chest x-ray 08/07/2017.

CLINICAL DATA: 67-year-old female with history of fever. History of
cancer.

EXAM:
CHEST  1 VIEW

[chest ap]
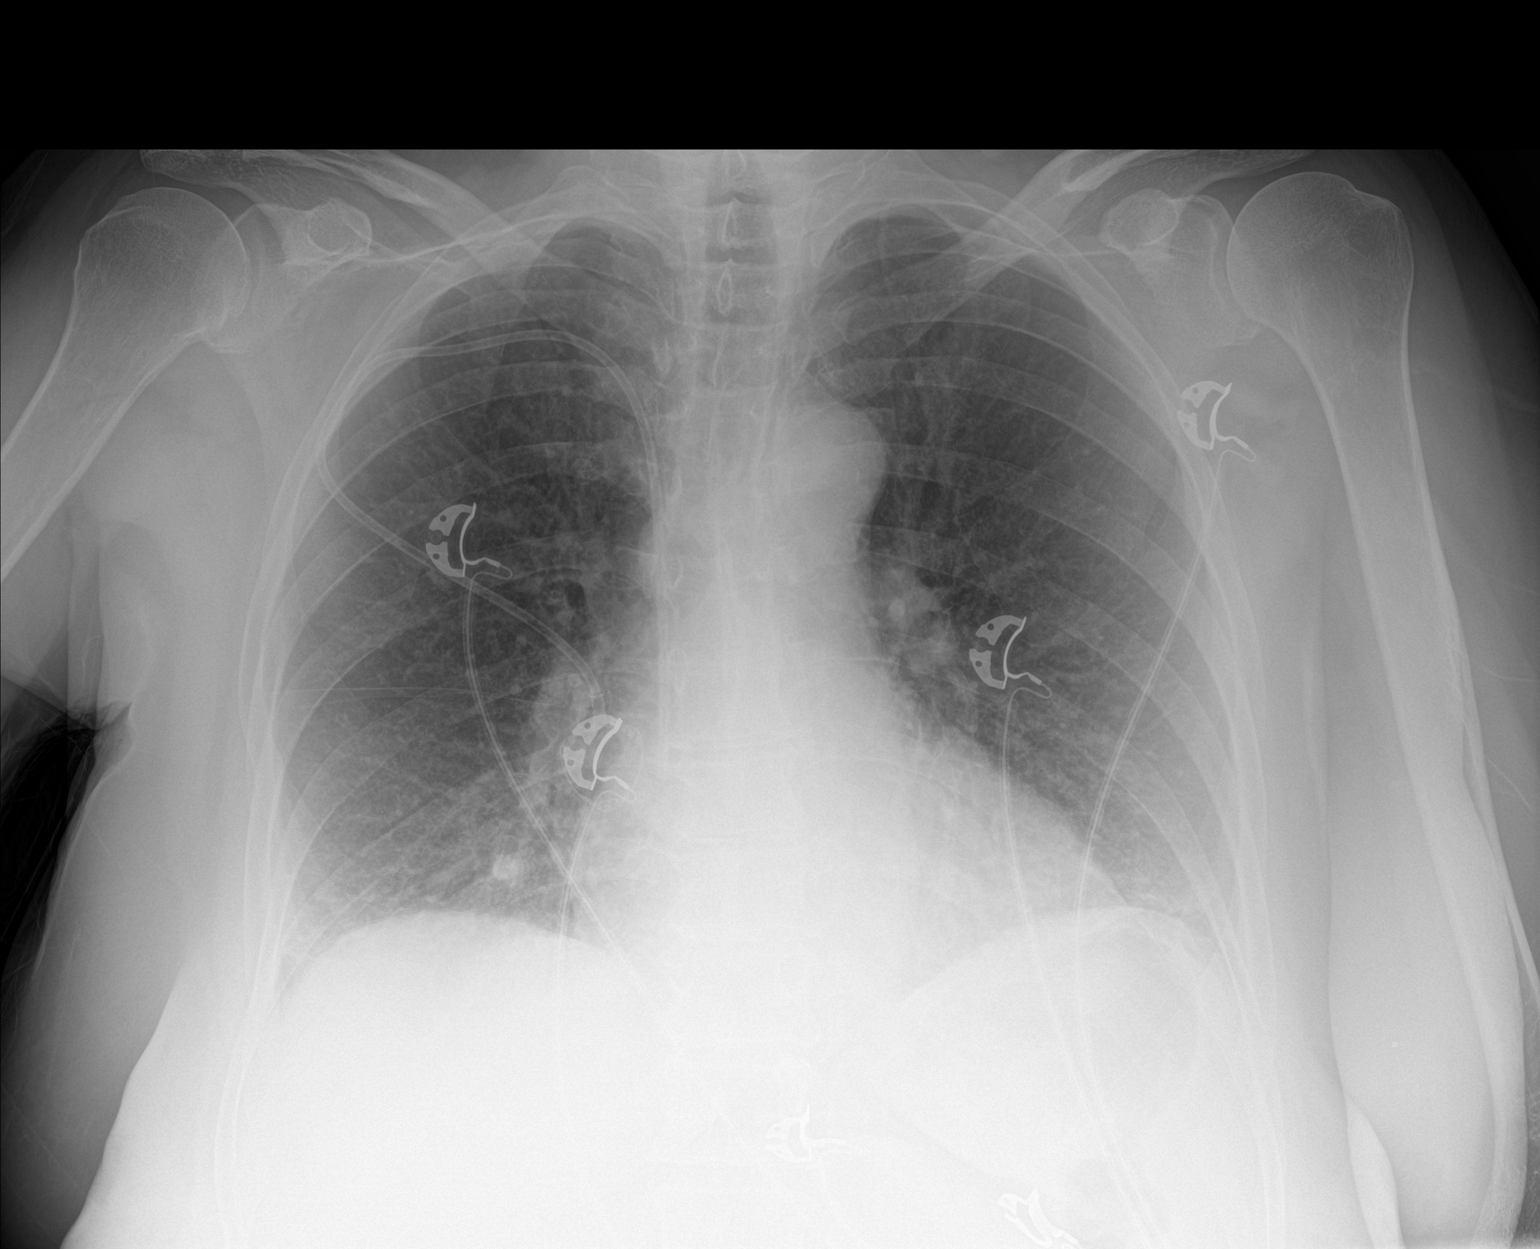

[1 of 1 positions shown; findings below may reference images not displayed]

FINDINGS: Right subclavian single-lumen porta cath with tip terminating in the
superior cavoatrial junction. Mild diffuse peribronchial cuffing.
Lung volumes are low. No consolidative airspace disease. No pleural
effusions. No pneumothorax. No pulmonary nodule or mass noted.
Pulmonary vasculature and the cardiomediastinal silhouette are
within normal limits. Aortic atherosclerosis.
IMPRESSION: 1. Mild diffuse peribronchial cuffing, concerning for acute
bronchitis.
2. Aortic atherosclerosis.

## 2020-03-08 NOTE — Telephone Encounter (Signed)
Reaching out to patient to offer assistance regarding upcoming cardiac imaging study; pt verbalizes understanding of appt date/time, parking situation and where to check in, pre-test NPO status and medications ordered, and verified current allergies; name and call back number provided for further questions should they arise  Omarion Minnehan RN Navigator Cardiac Imaging [place] [facility] [phone] office [phone] cell  

## 2020-03-10 ENCOUNTER — Ambulatory Visit
Admission: RE | Admit: 2020-03-10 | Discharge: 2020-03-10 | Disposition: A | Discharge status: Home / Self Care | Payer: Medicare Other | Source: Ambulatory Visit | Attending: Internal Medicine | Admitting: Internal Medicine

## 2020-03-10 ENCOUNTER — Other Ambulatory Visit: Payer: Self-pay

## 2020-03-10 DIAGNOSIS — R079 Chest pain, unspecified: Secondary | ICD-10-CM | POA: Insufficient documentation

## 2020-03-10 DIAGNOSIS — I700 Atherosclerosis of aorta: Secondary | ICD-10-CM | POA: Insufficient documentation

## 2020-03-10 MED ORDER — NITROGLYCERIN 0.4 MG SL SUBL
0.8000 mg | SUBLINGUAL_TABLET | Freq: Once | SUBLINGUAL | Status: AC
  Administered 2020-03-10: 0.8 mg via SUBLINGUAL

## 2020-03-10 MED ORDER — IOHEXOL 350 MG/ML SOLN
100.0000 mL | Freq: Once | INTRAVENOUS | Status: AC | PRN
  Administered 2020-03-10: 100 mL via INTRAVENOUS

## 2020-03-10 NOTE — Progress Notes (Signed)
Patient tolerated CT well. Drank water a soda and ate peanut butter crackers after. Vital signs stable encourage to drink water throughout day.Reasons explained and verbalized understanding. Ambulated steady gait.

## 2020-03-12 NOTE — Progress Notes (Signed)
[place]  Telephone:([phone]) [phone] Fax:(336) [phone]  ID: Alicia [name] OB: 18-Jun-1950  MR#: [id number]  VCB#:[id number]  Patient Care Team: [name], [name], Harrell Gave, [name] Rico Junker, RN as Registered Nurse Lloyd Huger, MD as Consulting Physician (Oncology) Bary Castilla Forest Gleason, MD as Consulting Physician (General Surgery) Bary Castilla Forest Gleason, MD as Consulting Physician (General Surgery) Noreene Filbert, MD as Referring Physician (Radiation Oncology) Jeral Fruit, RN as Registered Nurse   CHIEF COMPLAINT: Recurrent stage IV ER/PR positive, 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  INTERVAL HISTORY: Patient returns to clinic today for further evaluation and continuation of treatment with [name], Faslodex, and Xgeva.  She has occasional left hip pain, but otherwise feels well and is asymptomatic.  She is tolerating her treatments without significant side effects.  She does not complain of any other pain today.  She continues to have a mild peripheral neuropathy, but no other neurologic complaints.  She denies any chest pain, shortness of breath, cough, or hemoptysis.  She denies any nausea, vomiting, constipation, or diarrhea.  She has no urinary complaints.  Patient offers no further specific complaints today.  REVIEW OF SYSTEMS:   Review of Systems  Constitutional: Negative for fever, malaise/fatigue and weight loss.  HENT: Negative for congestion.   Respiratory: Negative.  Negative for cough and shortness of breath.   Cardiovascular: Negative.  Negative for chest pain and leg swelling.  Gastrointestinal: Negative.  Negative for abdominal pain, constipation, diarrhea and nausea.  Genitourinary: Negative.  Negative for dysuria and urgency.  Musculoskeletal: Positive for joint pain. Negative for back pain.  Skin: Negative.  Negative for rash.   Neurological: Positive for tingling and sensory change. Negative for dizziness, focal weakness, weakness and headaches.  Psychiatric/Behavioral: Negative.  The patient is not nervous/anxious.     As per HPI. Otherwise, a complete review of systems is negative.  PAST MEDICAL HISTORY: Past Medical History:  Diagnosis Date  . Anxiety   . Breast cancer, left ([place]) 07/2017  . Depression   . Hypertension   . Hypothyroidism   . Personal history of chemotherapy 2019   LEFT mastectomy-chemo before  . Personal history of radiation therapy 03/2018   LEFT mastectomy  . Rapid heart rate   . Thyroid disease     PAST SURGICAL HISTORY: Past Surgical History:  Procedure Laterality Date  . AXILLARY LYMPH NODE BIOPSY Left 07/16/2017   METASTATIC MAMMARY CARCINOMA  . BREAST BIOPSY Left 07/16/2017   [place] bx of left breast mass and left breast LN.  INVASIVE MAMMARY CARCINOMA, NO SPECIAL TYPE.   Marland Kitchen BREAST EXCISIONAL BIOPSY Right 2001   benign  . BREAST LUMPECTOMY WITH SENTINEL LYMPH NODE BIOPSY Left 12/06/2017   Procedure: BREAST LUMPECTOMY WITH SENTINEL LYMPH NODE BX;  Surgeon: Robert Bellow, MD;  Location: [facility] ORS;  Service: General;  Laterality: Left;  . COLONOSCOPY    . MASTECTOMY Left 12/23/2017  . PORTACATH PLACEMENT Right 08/07/2017   Procedure: INSERTION PORT-A-CATH;  Surgeon: Robert Bellow, MD;  Location: [facility] ORS;  Service: General;  Laterality: Right;  . SIMPLE MASTECTOMY WITH AXILLARY SENTINEL NODE BIOPSY Left 12/23/2017   T2,N2 with 6/7 nodes positive. Whole breast radiation.  Surgeon: Robert Bellow, MD;  Location: [facility] ORS;  Service: General;  Laterality: Left;  . TONSILLECTOMY      FAMILY HISTORY: Family History  Problem Relation Age of Onset  . Stroke Mother   .  Thyroid disease Mother   . Renal Disease Mother   . Stroke Father   . Heart attack Father   . Sudden death Father 72       suicide  . Anuerysm Brother   . Breast cancer Neg Hx     ADVANCED  DIRECTIVES (Y/N):  N  HEALTH MAINTENANCE: Social History   Tobacco Use  . Smoking status: Former Smoker    Packs/day: 1.00    Years: 30.00    Pack years: 30.00    Types: Cigarettes    Quit date: 2019    Years since quitting: 3.1  . Smokeless tobacco: Never Used  Vaping Use  . Vaping Use: Never used  Substance Use Topics  . Alcohol use: Not Currently  . Drug use: Never     Colonoscopy:  PAP:  Bone density:  Lipid panel:  Allergies  Allergen Reactions  . Sulfa Antibiotics Diarrhea    Current Outpatient Medications  Medication Sig Dispense Refill  . acetaminophen (TYLENOL) 500 MG tablet Take 1,000 mg by mouth every 6 (six) hours as needed for moderate pain or headache.     . ALPRAZolam (XANAX) 0.5 MG tablet Take 1 tablet (0.5 mg total) by mouth 2 (two) times daily. 60 tablet 0  . amLODipine (NORVASC) 5 MG tablet TAKE 1 TABLET BY MOUTH DAILY 90 tablet 3  . aspirin EC 81 MG tablet Take 81 mg by mouth at bedtime.     . calcium carbonate (OS-CAL - DOSED IN MG OF ELEMENTAL CALCIUM) 1250 (500 Ca) MG tablet Take 1 tablet by mouth daily with breakfast.    . Cholecalciferol (VITAMIN D3) 125 MCG (5000 UT) CAPS Take 1 capsule by mouth 2 (two) times a day.    . docusate sodium (COLACE) 100 MG capsule Take 100 mg by mouth 3 (three) times daily as needed for mild constipation.    . hydrochlorothiazide (HYDRODIURIL) 12.5 MG tablet Take 1 tablet (12.5 mg total) by mouth daily. 90 tablet 3  . levothyroxine (SYNTHROID) 100 MCG tablet TAKE 1 TABLET BY MOUTH DAILY 90 tablet 3  . losartan (COZAAR) 100 MG tablet Take 1 tablet (100 mg total) by mouth daily. 90 tablet 3  . metoprolol tartrate (LOPRESSOR) 25 MG tablet Take 1 tablet (25 mg total) by mouth once for 1 dose. Take 2 hours prior to CTA. 1 tablet 0  . ondansetron (ZOFRAN) 8 MG tablet Take 1 tablet (8 mg total) by mouth every 8 (eight) hours as needed for nausea or vomiting. 20 tablet 0  . palbociclib (IBRANCE) 125 MG tablet Take 1 tablet  (125 mg total) by mouth daily. Take for 21 days on, 7 days off, repeat every 28 days. 21 tablet 3  . rosuvastatin (CRESTOR) 5 MG tablet TAKE 1 TABLET BY MOUTH DAILY 30 tablet 6  . traZODone (DESYREL) 50 MG tablet TAKE 1 TABLET BY MOUTH DAILY 30 tablet 6   No current facility-administered medications for this visit.    OBJECTIVE: Vitals:   03/17/20 1402  BP: 122/73  Pulse: 74  Resp: 18  Temp: 97.7 F (36.5 C)  SpO2: 100%     Body mass index is 36.19 kg/m.    ECOG FS:0 - Asymptomatic  General: Well-developed, well-nourished, no acute distress. Eyes: Pink conjunctiva, anicteric sclera. HEENT: Normocephalic, moist mucous membranes. Lungs: No audible wheezing or coughing. Heart: Regular rate and rhythm. Abdomen: Soft, nontender, no obvious distention. Musculoskeletal: No edema, cyanosis, or clubbing. Neuro: Alert, answering all questions appropriately. Cranial nerves grossly intact.  Skin: No rashes or petechiae noted. Psych: Normal affect.   LAB RESULTS:  Lab Results  Component Value Date   NA 136 03/17/2020   K 3.6 03/17/2020   CL 101 03/17/2020   CO2 24 03/17/2020   GLUCOSE 108 (H) 03/17/2020   BUN 21 03/17/2020   CREATININE 1.40 (H) 03/17/2020   CALCIUM 8.8 (L) 03/17/2020   PROT 7.1 03/17/2020   ALBUMIN 4.2 03/17/2020   AST 22 03/17/2020   ALT 15 03/17/2020   ALKPHOS 69 03/17/2020   BILITOT 0.7 03/17/2020   GFRNONAA 40 (L) 03/17/2020   GFRAA 57 (L) 10/01/2019    Lab Results  Component Value Date   WBC 2.3 (L) 03/17/2020   NEUTROABS 1.3 (L) 03/17/2020   HGB 11.2 (L) 03/17/2020   HCT 33.0 (L) 03/17/2020   MCV 103.1 (H) 03/17/2020   PLT 148 (L) 03/17/2020     STUDIES: CT CORONARY MORPH W/CTA COR W/SCORE W/CA W/CM &/OR WO/CM  Addendum Date: 03/10/2020   ADDENDUM REPORT: 03/10/2020 16:34 EXAM: OVER-READ INTERPRETATION  CT CHEST The following report is an over-read performed by radiologist Dr. Rebekah Chesterfield [facility] on 03/10/2020. This  over-read does not include interpretation of cardiac or coronary anatomy or pathology. The coronary calcium score and cardiac CTA interpretation by the cardiologist is attached. COMPARISON:  None. FINDINGS: Patchy areas of ground-glass attenuation and septal thickening noted in the lungs bilaterally, similar to the prior study, likely to reflect mild interstitial lung disease. Subpleural reticulation deep to the left breast in the anterior aspect of the left upper lobe also most compatible with an area of postradiation fibrosis. Aortic atherosclerosis. Calcified granuloma in the right lower lobe. Within the visualized portions of the thorax there are no other suspicious appearing pulmonary nodules or masses, there is no acute consolidative airspace disease, no pleural effusions, no pneumothorax and no lymphadenopathy. Visualized portions of the upper abdomen are unremarkable. There are no aggressive appearing lytic or blastic lesions noted in the visualized portions of the skeleton. Postoperative changes of left modified radical mastectomy. IMPRESSION: 1. The appearance of the lungs suggests probable interstitial lung disease. Follow-up nonemergent high-resolution chest CT is suggested in the near future for better evaluation. 2. Aortic Atherosclerosis (ICD10-I70.0). Electronically Signed   By: Vinnie Langton M.D.   On: 03/10/2020 16:34   Result Date: 03/10/2020 CLINICAL DATA:  Chestpain EXAM: Cardiac/Coronary  CTA TECHNIQUE: The patient was scanned on a Siemens Somatoform go.Top scanner. FINDINGS: A retrospective scan was triggered in the descending thoracic aorta. Axial non-contrast 3 mm slices were carried out through the heart. The data set was analyzed on a dedicated work station and scored using the [place]. Gantry rotation speed was 330 msecs and collimation was .6 mm. 62m of metoprolol and 0.8 mg of sl NTG was given. The 3D data set was reconstructed in 5% intervals of the 60-95 % of the R-R  cycle. Diastolic phases were analyzed on a dedicated work station using MPR, MIP and VRT modes. The patient received 100 cc of contrast. Aorta:  Normal size.  No calcifications.  No dissection. Aortic Valve:  Trileaflet.  No calcifications. Coronary Arteries:  Normal coronary origin.  Right dominance. RCA is a dominant artery that gives rise to PDA and PLA. There is no plaque. Left main is a large artery that gives rise to LAD and LCX arteries. LAD is has no plaque. Diagonal vessel did not show any evidence of disease LCX is a non-dominant artery that gives rise to three  obtuse marginal branches. There is no plaque. Other findings: Normal pulmonary vein drainage into the left atrium. Normal left atrial appendage without a thrombus. Normal size of the pulmonary artery. IMPRESSION: 1. Very low coronary calcium score of 2.12. Minimal calcification located in the first diagonal branch. This was 41st percentile for age and sex matched control. 2. Normal coronary origin with right dominance. 3. No evidence of obstructive CAD. 4. Consider non-atherosclerotic causes of chest pain. Electronically Signed: By: Kate Sable M.D. On: 03/10/2020 15:53   ONCOLOGY HISTORY: Patient initially received neoadjuvant chemotherapy with Adriamycin and Cytoxan followed by weekly Taxol. She only received 4 cycles of weekly Taxol prior to discontinuation of treatment on October 31, 2017 secondary to persistent peripheral neuropathy.  She ultimately required mastectomy and final pathology noted 5 of 6 lymph nodes positive for disease.  Patient completed adjuvant XRT in mid April 2020.  Pain initiated letrozole in May 2020, this was discontinued secondary to side effects and patient was started on anastrozole.  Nuclear medicine bone scan on June 19, 2019 revealed metastatic disease and anastrozole was subsequently discontinued.  ASSESSMENT: Recurrent stage IV ER/PR positive, HER-2 negative invasive carcinoma with bony  metastasis.  PLAN:    1. 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  MRI of the brain on August 24, 2019 reviewed independently with no obvious evidence of metastatic disease.  Patient's CA 27-29 initially increased to 141.9, but continues to slowly trend down and is now 46.9.  Despite mild neutropenia, will continue 125 mg Ibrance for 21 days with 7 days off.  Proceed with Faslodex and Xgeva today.  Return to clinic in 4 weeks for treatment and evaluation by clinical pharmacist and then in 8 weeks for further evaluation and continuation of treatment. 2.  Peripheral neuropathy: Chronic and unchanged. 3.  Pulmonary embolus: Patient was diagnosed with a small pulmonary embolus on January 10, 2018.  She is no longer on anticoagulation.  There was no evidence of recurrent PE on CT scan from October 27, 2019. 4.  Osteopenia: Patient's most recent bone mineral density on March 03, 2019 reported a T score of -1.8 which is only mildly decreased from 1 year prior when the reported T score was -1.6.  Continue calcium and vitamin D supplementation.  Xgeva as above. 5.  Left hip pain: Mild.  Continue symptomatic treatment.  Patient has had XRT to this area. 6.  Leukopenia: Chronic and unchanged.  Proceed with treatment as planned. 7.  Anemia: Chronic and unchanged.  Patient's hemoglobin is 11.2 today. 8.  Renal insufficiency: Mild.  Patient's creatinine is 1.4 today, monitor.  Patient expressed understanding and was in agreement with this plan. She also understands that She can call clinic at any time with any questions, concerns, or complaints.   Cancer Staging Breast cancer, stage 2, left ([place]) Staging form: Breast, AJCC 8th Edition - Clinical stage from 07/30/2017: Stage IIA (cT2, cN1, cM0, G2, ER+, PR+, HER2-) - Signed by  Lloyd Huger, MD on 07/30/2017 Histologic grading system: 3 grade system Laterality: Left   Lloyd Huger, MD   03/19/2020 7:40 AM

## 2020-03-14 DIAGNOSIS — G4733 Obstructive sleep apnea (adult) (pediatric): Secondary | ICD-10-CM | POA: Diagnosis not present

## 2020-03-14 MED FILL — IBRANCE 125 MG TABS: 125 | 28 days supply | Qty: 21 | Fill #1

## 2020-03-17 ENCOUNTER — Inpatient Hospital Stay: Payer: Medicare Other | Admitting: Pharmacist

## 2020-03-17 ENCOUNTER — Inpatient Hospital Stay: Payer: Medicare Other | Attending: Oncology

## 2020-03-17 ENCOUNTER — Inpatient Hospital Stay (HOSPITAL_BASED_OUTPATIENT_CLINIC_OR_DEPARTMENT_OTHER): Payer: Medicare Other | Admitting: Oncology

## 2020-03-17 ENCOUNTER — Encounter: Payer: Self-pay | Admitting: Oncology

## 2020-03-17 ENCOUNTER — Inpatient Hospital Stay: Payer: Medicare Other

## 2020-03-17 VITALS — BP 122/73 | HR 74 | Temp 97.7°F | Resp 18 | Wt 224.2 lb

## 2020-03-17 DIAGNOSIS — C7951 Secondary malignant neoplasm of bone: Secondary | ICD-10-CM | POA: Diagnosis not present

## 2020-03-17 DIAGNOSIS — C50912 Malignant neoplasm of unspecified site of left female breast: Secondary | ICD-10-CM

## 2020-03-17 DIAGNOSIS — C50919 Malignant neoplasm of unspecified site of unspecified female breast: Secondary | ICD-10-CM

## 2020-03-17 DIAGNOSIS — Z5111 Encounter for antineoplastic chemotherapy: Secondary | ICD-10-CM | POA: Diagnosis not present

## 2020-03-17 DIAGNOSIS — Z79899 Other long term (current) drug therapy: Secondary | ICD-10-CM | POA: Diagnosis not present

## 2020-03-17 LAB — CBC WITH DIFFERENTIAL/PLATELET
Abs Immature Granulocytes: 0.01 10*3/uL (ref 0.00–0.07)
Basophils Absolute: 0 10*3/uL (ref 0.0–0.1)
Basophils Relative: 1 %
Eosinophils Absolute: 0 10*3/uL (ref 0.0–0.5)
Eosinophils Relative: 1 %
HCT: 33 % — ABNORMAL LOW (ref 36.0–46.0)
Hemoglobin: 11.2 g/dL — ABNORMAL LOW (ref 12.0–15.0)
Immature Granulocytes: 0 %
Lymphocytes Relative: 20 %
Lymphs Abs: 0.5 10*3/uL — ABNORMAL LOW (ref 0.7–4.0)
MCH: 35 pg — ABNORMAL HIGH (ref 26.0–34.0)
MCHC: 33.9 g/dL (ref 30.0–36.0)
MCV: 103.1 fL — ABNORMAL HIGH (ref 80.0–100.0)
Monocytes Absolute: 0.4 10*3/uL (ref 0.1–1.0)
Monocytes Relative: 20 %
Neutro Abs: 1.3 10*3/uL — ABNORMAL LOW (ref 1.7–7.7)
Neutrophils Relative %: 58 %
Platelets: 148 10*3/uL — ABNORMAL LOW (ref 150–400)
RBC: 3.2 MIL/uL — ABNORMAL LOW (ref 3.87–5.11)
RDW: 14.4 % (ref 11.5–15.5)
WBC: 2.3 10*3/uL — ABNORMAL LOW (ref 4.0–10.5)
nRBC: 0 % (ref 0.0–0.2)

## 2020-03-17 LAB — COMPREHENSIVE METABOLIC PANEL
ALT: 15 U/L (ref 0–44)
AST: 22 U/L (ref 15–41)
Albumin: 4.2 g/dL (ref 3.5–5.0)
Alkaline Phosphatase: 69 U/L (ref 38–126)
Anion gap: 11 (ref 5–15)
BUN: 21 mg/dL (ref 8–23)
CO2: 24 mmol/L (ref 22–32)
Calcium: 8.8 mg/dL — ABNORMAL LOW (ref 8.9–10.3)
Chloride: 101 mmol/L (ref 98–111)
Creatinine, Ser: 1.4 mg/dL — ABNORMAL HIGH (ref 0.44–1.00)
GFR, Estimated: 40 mL/min — ABNORMAL LOW (ref >60)
Glucose, Bld: 108 mg/dL — ABNORMAL HIGH (ref 70–99)
Potassium: 3.6 mmol/L (ref 3.5–5.1)
Sodium: 136 mmol/L (ref 135–145)
Total Bilirubin: 0.7 mg/dL (ref 0.3–1.2)
Total Protein: 7.1 g/dL (ref 6.5–8.1)

## 2020-03-17 MED ORDER — FULVESTRANT 250 MG/5ML IM SOLN
500.0000 mg | Freq: Once | INTRAMUSCULAR | Status: AC
  Administered 2020-03-17: 500 mg via INTRAMUSCULAR
  Filled 2020-03-17: qty 10

## 2020-03-17 MED ORDER — DENOSUMAB 120 MG/1.7ML ~~LOC~~ SOLN
120.0000 mg | Freq: Once | SUBCUTANEOUS | Status: AC
Start: 2020-03-17 — End: 2020-03-17
  Administered 2020-03-17: 120 mg via SUBCUTANEOUS
  Filled 2020-03-17: qty 1.7

## 2020-03-17 NOTE — Progress Notes (Signed)
[place]  [phone]) [phone] [phone]) [phone]  Patient Care Team: [name], [name], Harrell Gave, [name] Rico Junker, RN as Registered Nurse Grayland Ormond Kathlene November, MD as Consulting Physician (Oncology) Bary Castilla Forest Gleason, MD as Consulting Physician (General Surgery) Bary Castilla Forest Gleason, MD as Consulting Physician (General Surgery) Noreene Filbert, MD as Referring Physician (Radiation Oncology) Jeral Fruit, RN as Registered Nurse   Name of the patient: Alicia [name]  [id number]  02/01/1950   Date of visit: 03/17/20  HPI: Patient is a 70 y.o. female with recurrent stage IV ER/PR positive, HER2 negative breast cancer. Patient started on Ibrance on 07/09/19.  Reason for Consult: Oral chemotherapy follow-up for Ibrance (palbociclib) therapy, s/p cycle 9.   PAST MEDICAL HISTORY: Past Medical History:  Diagnosis Date  . Anxiety   . Breast cancer, left ([place]) 07/2017  . Depression   . Hypertension   . Hypothyroidism   . Personal history of chemotherapy 2019   LEFT mastectomy-chemo before  . Personal history of radiation therapy 03/2018   LEFT mastectomy  . Rapid heart rate   . Thyroid disease     PAST SURGICAL HISTORY:  Past Surgical History:  Procedure Laterality Date  . AXILLARY LYMPH NODE BIOPSY Left 07/16/2017   METASTATIC MAMMARY CARCINOMA  . BREAST BIOPSY Left 07/16/2017   [place] bx of left breast mass and left breast LN.  INVASIVE MAMMARY CARCINOMA, NO SPECIAL TYPE.   Marland Kitchen BREAST EXCISIONAL BIOPSY Right 2001   benign  . BREAST LUMPECTOMY WITH SENTINEL LYMPH NODE BIOPSY Left 12/06/2017   Procedure: BREAST LUMPECTOMY WITH SENTINEL LYMPH NODE BX;  Surgeon: Robert Bellow, MD;  Location: [facility] ORS;  Service: General;  Laterality: Left;  . COLONOSCOPY    . MASTECTOMY Left 12/23/2017  . PORTACATH PLACEMENT Right 08/07/2017   Procedure:  INSERTION PORT-A-CATH;  Surgeon: Robert Bellow, MD;  Location: [facility] ORS;  Service: General;  Laterality: Right;  . SIMPLE MASTECTOMY WITH AXILLARY SENTINEL NODE BIOPSY Left 12/23/2017   T2,N2 with 6/7 nodes positive. Whole breast radiation.  Surgeon: Robert Bellow, MD;  Location: [facility] ORS;  Service: General;  Laterality: Left;  . TONSILLECTOMY      HEMATOLOGY/ONCOLOGY HISTORY:  Oncology History Overview Note  Patient is a 70 year old female who recently self palpated a mass on her left breast.  Subsequent imaging and biopsy revealed the above-stated breast cancer.  Case was also discussed extensively at case conference.  Given the size and the stage of patient's malignancy, she will benefit from neoadjuvant chemotherapy using Adriamycin, Cytoxan, and Taxol.  Patient will also require Neulasta support.  Will get CT scan of the chest, abdomen, and pelvis to assess for any metastatic disease.  Patient will also require port placement and MUGA prior to initiating treatment  CT abdomen/pelvis/chest did not reveal any suspicious lesions concerning for metastatic disease. (08/01/17)  Port-A-Cath placed on 08/07/2017.  Cycle 1 day 1 of AC was given on 08/08/17.     Breast cancer, stage 2, left ([place])  07/24/2017 Initial Diagnosis   Breast cancer, stage 2, left ([place])   07/30/2017 Cancer Staging   Staging form: Breast, AJCC 8th Edition - Clinical stage from 07/30/2017: Stage IIA (cT2, cN1, cM0, G2, ER+, PR+, HER2-) - Signed by Lloyd Huger, MD on 07/30/2017   08/08/2017 - 10/31/2017 Chemotherapy   The patient had DOXOrubicin (ADRIAMYCIN) chemo injection 130 mg, 60 mg/m2 = 130 mg, Intravenous,  Once, 4 of 4 cycles Administration: 130 mg (08/08/2017), 130 mg (08/22/2017), 130 mg (09/05/2017), 130 mg (09/19/2017) palonosetron (ALOXI) injection 0.25 mg, 0.25 mg, Intravenous,  Once, 4 of 4 cycles Administration: 0.25 mg (08/08/2017), 0.25 mg (08/22/2017), 0.25 mg (09/05/2017), 0.25 mg  (09/19/2017) pegfilgrastim-cbqv (UDENYCA) injection 6 mg, 6 mg, Subcutaneous, Once, 4 of 4 cycles Administration: 6 mg (08/09/2017), 6 mg (08/23/2017), 6 mg (09/06/2017), 6 mg (09/20/2017) cyclophosphamide (CYTOXAN) 1,300 mg in sodium chloride 0.9 % 250 mL chemo infusion, 600 mg/m2 = 1,300 mg, Intravenous,  Once, 4 of 4 cycles Administration: 1,300 mg (08/08/2017), 1,300 mg (08/22/2017), 1,300 mg (09/05/2017), 1,300 mg (09/19/2017) PACLitaxel (TAXOL) 174 mg in sodium chloride 0.9 % 250 mL chemo infusion (</= 24m/m2), 80 mg/m2 = 174 mg, Intravenous,  Once, 4 of 12 cycles Dose modification: 72 mg/m2 (original dose 80 mg/m2, Cycle 6, Reason: Dose not tolerated) Administration: 174 mg (10/03/2017), 156 mg (10/17/2017), 156 mg (10/24/2017), 156 mg (10/31/2017) fosaprepitant (EMEND) 150 mg, dexamethasone (DECADRON) 12 mg in sodium chloride 0.9 % 145 mL IVPB, , Intravenous,  Once, 4 of 4 cycles Administration:  (08/08/2017),  (08/22/2017),  (09/05/2017),  (09/19/2017)  for chemotherapy treatment.      ALLERGIES:  is allergic to sulfa antibiotics.  MEDICATIONS:  Current Outpatient Medications  Medication Sig Dispense Refill  . acetaminophen (TYLENOL) 500 MG tablet Take 1,000 mg by mouth every 6 (six) hours as needed for moderate pain or headache.     . ALPRAZolam (XANAX) 0.5 MG tablet Take 1 tablet (0.5 mg total) by mouth 2 (two) times daily. 60 tablet 0  . amLODipine (NORVASC) 5 MG tablet TAKE 1 TABLET BY MOUTH DAILY 90 tablet 3  . aspirin EC 81 MG tablet Take 81 mg by mouth at bedtime.     . calcium carbonate (OS-CAL - DOSED IN MG OF ELEMENTAL CALCIUM) 1250 (500 Ca) MG tablet Take 1 tablet by mouth daily with breakfast.    . Cholecalciferol (VITAMIN D3) 125 MCG (5000 UT) CAPS Take 1 capsule by mouth 2 (two) times a day.    . docusate sodium (COLACE) 100 MG capsule Take 100 mg by mouth 3 (three) times daily as needed for mild constipation.    . hydrochlorothiazide (HYDRODIURIL) 12.5 MG tablet Take 1 tablet (12.5 mg  total) by mouth daily. 90 tablet 3  . levothyroxine (SYNTHROID) 100 MCG tablet TAKE 1 TABLET BY MOUTH DAILY 90 tablet 3  . losartan (COZAAR) 100 MG tablet Take 1 tablet (100 mg total) by mouth daily. 90 tablet 3  . metoprolol tartrate (LOPRESSOR) 25 MG tablet Take 1 tablet (25 mg total) by mouth once for 1 dose. Take 2 hours prior to CTA. 1 tablet 0  . ondansetron (ZOFRAN) 8 MG tablet Take 1 tablet (8 mg total) by mouth every 8 (eight) hours as needed for nausea or vomiting. 20 tablet 0  . palbociclib (IBRANCE) 125 MG tablet Take 1 tablet (125 mg total) by mouth daily. Take for 21 days on, 7 days off, repeat every 28 days. 21 tablet 3  . rosuvastatin (CRESTOR) 5 MG tablet TAKE 1 TABLET BY MOUTH DAILY 30 tablet 6  . traZODone (DESYREL) 50 MG tablet TAKE 1 TABLET BY MOUTH DAILY 30 tablet 6   No current facility-administered medications for this visit.    VITAL SIGNS: There were no vitals taken for this visit. There were no vitals filed for this visit.  Estimated body mass index is 36.19 kg/m as calculated from the following:   Height as  of 03/04/20: _0  (1.676 m).   Weight as of an earlier encounter on 03/17/20: 101.7 kg (224 lb 3.2 oz).  LABS: CBC:    Component Value Date/Time   WBC 2.3 (L) 03/17/2020 1339   HGB 11.2 (L) 03/17/2020 1339   HGB 14.2 02/11/2011 1349   HCT 33.0 (L) 03/17/2020 1339   HCT 40.9 02/11/2011 1349   PLT 148 (L) 03/17/2020 1339   PLT 151 02/11/2011 1349   MCV 103.1 (H) 03/17/2020 1339   MCV 89 02/11/2011 1349   NEUTROABS 1.3 (L) 03/17/2020 1339   LYMPHSABS 0.5 (L) 03/17/2020 1339   MONOABS 0.4 03/17/2020 1339   EOSABS 0.0 03/17/2020 1339   BASOSABS 0.0 03/17/2020 1339   Comprehensive Metabolic Panel:    Component Value Date/Time   NA 136 03/17/2020 1339   NA 142 02/11/2011 1349   K 3.6 03/17/2020 1339   K 3.8 02/11/2011 1349   CL 101 03/17/2020 1339   CL 107 02/11/2011 1349   CO2 24 03/17/2020 1339   CO2 24 02/11/2011 1349   BUN 21 03/17/2020 1339    BUN 14 02/11/2011 1349   CREATININE 1.40 (H) 03/17/2020 1339   CREATININE 0.65 02/11/2011 1349   GLUCOSE 108 (H) 03/17/2020 1339   GLUCOSE 98 02/11/2011 1349   CALCIUM 8.8 (L) 03/17/2020 1339   CALCIUM 9.0 02/11/2011 1349   AST 22 03/17/2020 1339   AST 27 02/11/2011 1349   ALT 15 03/17/2020 1339   ALT 36 02/11/2011 1349   ALKPHOS 69 03/17/2020 1339   ALKPHOS 122 02/11/2011 1349   BILITOT 0.7 03/17/2020 1339   BILITOT 0.5 02/11/2011 1349   PROT 7.1 03/17/2020 1339   PROT 7.0 02/11/2011 1349   ALBUMIN 4.2 03/17/2020 1339   ALBUMIN 4.1 02/11/2011 1349    RADIOGRAPHIC STUDIES: CT CORONARY MORPH W/CTA COR W/SCORE W/CA W/CM &/OR WO/CM  Addendum Date: 03/10/2020   ADDENDUM REPORT: 03/10/2020 16:34 EXAM: OVER-READ INTERPRETATION  CT CHEST The following report is an over-read performed by radiologist Dr. Rebekah Chesterfield [facility] on 03/10/2020. This over-read does not include interpretation of cardiac or coronary anatomy or pathology. The coronary calcium score and cardiac CTA interpretation by the cardiologist is attached. COMPARISON:  None. FINDINGS: Patchy areas of ground-glass attenuation and septal thickening noted in the lungs bilaterally, similar to the prior study, likely to reflect mild interstitial lung disease. Subpleural reticulation deep to the left breast in the anterior aspect of the left upper lobe also most compatible with an area of postradiation fibrosis. Aortic atherosclerosis. Calcified granuloma in the right lower lobe. Within the visualized portions of the thorax there are no other suspicious appearing pulmonary nodules or masses, there is no acute consolidative airspace disease, no pleural effusions, no pneumothorax and no lymphadenopathy. Visualized portions of the upper abdomen are unremarkable. There are no aggressive appearing lytic or blastic lesions noted in the visualized portions of the skeleton. Postoperative changes of left modified radical  mastectomy. IMPRESSION: 1. The appearance of the lungs suggests probable interstitial lung disease. Follow-up nonemergent high-resolution chest CT is suggested in the near future for better evaluation. 2. Aortic Atherosclerosis (ICD10-I70.0). Electronically Signed   By: Vinnie Langton M.D.   On: 03/10/2020 16:34   Result Date: 03/10/2020 CLINICAL DATA:  Chestpain EXAM: Cardiac/Coronary  CTA TECHNIQUE: The patient was scanned on a Siemens Somatoform go.Top scanner. FINDINGS: A retrospective scan was triggered in the descending thoracic aorta. Axial non-contrast 3 mm slices were carried out through the heart. The data set  was analyzed on a dedicated work station and scored using the [employer]. Gantry rotation speed was 330 msecs and collimation was .6 mm. 4m of metoprolol and 0.8 mg of sl NTG was given. The 3D data set was reconstructed in 5% intervals of the 60-95 % of the R-R cycle. Diastolic phases were analyzed on a dedicated work station using MPR, MIP and VRT modes. The patient received 100 cc of contrast. Aorta:  Normal size.  No calcifications.  No dissection. Aortic Valve:  Trileaflet.  No calcifications. Coronary Arteries:  Normal coronary origin.  Right dominance. RCA is a dominant artery that gives rise to PDA and PLA. There is no plaque. Left main is a large artery that gives rise to LAD and LCX arteries. LAD is has no plaque. Diagonal vessel did not show any evidence of disease LCX is a non-dominant artery that gives rise to three obtuse marginal branches. There is no plaque. Other findings: Normal pulmonary vein drainage into the left atrium. Normal left atrial appendage without a thrombus. Normal size of the pulmonary artery. IMPRESSION: 1. Very low coronary calcium score of 2.12. Minimal calcification located in the first diagonal branch. This was 41st percentile for age and sex matched control. 2. Normal coronary origin with right dominance. 3. No evidence of obstructive CAD. 4. Consider  non-atherosclerotic causes of chest pain. Electronically Signed: By: BKate SableM.D. On: 03/10/2020 15:53     Assessment and Plan-  Reviewed CBC and differential and will plan to continue Ibrance at 1257mdosing for cycle 10.    Oral Chemotherapy Side Effect/Intolerance:  - Constipation: much improved, she feels like she has found the right combination of anti-constipation medications to keep her regular. She is using her docusate and Miralax (1.5 capfuls). - Fatigue: slight but manageable fatigue, she tries to keep moving as much as possible. She does note that if she walks too much her hip will start to hurt. Suggested maybe trying water aerobics which may allow her to keep active with less hip pressure. She has tried and enjoyed this in the past.   Oral Chemotherapy Adherence: No reported missed doses  Medication Access Issues: Ms. HoWestley Chandleras a approved for LIS this will bring down her prescription cross across the board. [facility]  Patient expressed understanding and was in agreement with this plan. She also understands that She can call clinic at any time with any questions, concerns, or complaints.   Thank you for allowing me to participate in the care of this very pleasant patient.   Time Total: 10 mins  Visit consisted of counseling and education on dealing with issues of symptom management in the setting of serious and potentially life-threatening illness.Greater than 50%  of this time was spent counseling and coordinating care related to the above assessment and plan.  Signed by: AlDarl PikesPharmD, BCPS, BCSalley SlaughterCPP Hematology/Oncology Clinical Pharmacist Practitioner [phone]/03/2020 2:41 PM

## 2020-03-17 NOTE — Progress Notes (Signed)
Patient here for oncology follow-up appointment, expresses no complaints or concerns at this time.    

## 2020-03-18 LAB — CANCER ANTIGEN 27.29: CA 27.29: 46.9 U/mL — ABNORMAL HIGH (ref 0.0–38.6)

## 2020-03-21 ENCOUNTER — Encounter: Payer: Self-pay | Admitting: Primary Care

## 2020-03-21 ENCOUNTER — Other Ambulatory Visit: Payer: Self-pay

## 2020-03-21 ENCOUNTER — Telehealth: Payer: Self-pay | Admitting: Primary Care

## 2020-03-21 ENCOUNTER — Ambulatory Visit (INDEPENDENT_AMBULATORY_CARE_PROVIDER_SITE_OTHER): Payer: Medicare Other | Admitting: Primary Care

## 2020-03-21 DIAGNOSIS — E669 Obesity, unspecified: Secondary | ICD-10-CM

## 2020-03-21 DIAGNOSIS — G4730 Sleep apnea, unspecified: Secondary | ICD-10-CM

## 2020-03-21 NOTE — Progress Notes (Signed)
Reviewed and agree with assessment/plan.   Chesley Mires, MD [facility] Pulmonary/Critical Care 03/21/2020, 5:47 PM Pager:  [phone]

## 2020-03-21 NOTE — Patient Instructions (Signed)
Sleep study showed mild sleep apnea Work on weight loss, goal <200lbs, refer nutrition consult  Refer pulmonary consult regarding ILD

## 2020-03-21 NOTE — Progress Notes (Signed)
Virtual Visit via Telephone Note  I connected with Alicia [name] on 03/21/20 at  4:30 PM EST by telephone and verified that I am speaking with the correct person using two identifiers.  Location: Patient: Home Provider: Office   I discussed the limitations, risks, security and privacy concerns of performing an evaluation and management service by telephone and the availability of in person appointments. I also discussed with the patient that there may be a patient responsible charge related to this service. The patient expressed understanding and agreed to proceed.   History of Present Illness: 70 year old female, former smoker quit in 2019. PMH significant for HTN, pulmonary embolism, GERD, thyroid disease, obesity, left breast cancer s/p mastectomy and chemo/radiation.  Previous [facility] pulmonary encounters: 02/17/2020- Sleep consult  Patient presents today for sleep consult. Referrd by Cardiology. She had zio patch that showed evidence of possible underlying sleep apnea. Patient reports symptoms of choking/struggling to breath at night and daytime fatigue. She sometimes she has a hard time falling asleep at night. States that she gets anxious and/or afraid that she will over sleep if she has an apt the next day. She gets on average 6 hours of sleep a night. She wakes up 2-3 times at night to use the restroom.   Sleep Questionnaire Symptoms: Choking at night, daytime somnolence  Previous sleep study: No Typical bedtime: 12am Length of time to fall asleep: 1-2 hours at Nocturnal awakenings: 2 Out of bed in the morning: 10am  Epworth Score: 7/24  03/21/2020 Patient contacted today to review sleep study. HST showed mild OSA.  Reviewed treatment options including weight loss, side sleeping position, oral appliance, CPAP or referral to ENT for possible surgical interventions.  She was originally referred by cardiology d/t cardiac arrhythmia, concern underlying sleep apnea. Event monitor showed  rare PACs and PVCs as well as brief episodes PSVT. Transient mobitz type 1 second degree AV block. Patient had cardiac/coronary CTA that showed possible mild ILD.  Observations/Objective:  - Able to speak in full sentences without over shortness of breath or wheezing   Assessment and Plan:  Mild OSA - HST 03/02/20 >> AHI 6.7/HR, SpO2 83% - Discussed treatment options and risks of untreated sleep apnea at length  - I advised patient that we recommended she start CPAP therapy due to cardiac arrhythmia, however, she would like to work on weight loss first before considering oral appliance or CPAP  Obesity: - Goal <200lbs - Refer nutrition consult   Abnormal CT imaging - Cardiac CT in February 2022 showed patchy areas of ground glass attenuation and septal thickening bilaterally, similar to prior study and likely reflects mild ILD - Refer to Dr. Chase Caller or [facility] for work-up   Follow Up Instructions:   -  [facility] ILD specialist in 1-3 months for consult  I discussed the assessment and treatment plan with the patient. The patient was provided an opportunity to ask questions and all were answered. The patient agreed with the plan and demonstrated an understanding of the instructions.   The patient was advised to call back or seek an in-person evaluation if the symptoms worsen or if the condition fails to improve as anticipated.  I provided 28 minutes of non-face-to-face time during this encounter.   Martyn Ehrich, NP

## 2020-03-21 NOTE — Telephone Encounter (Signed)
Can we please get patient an apt with either Dr. Chase Caller or [facility] in the next 1-3 months for ILD consult

## 2020-03-22 NOTE — Telephone Encounter (Signed)
Attempted to call pt to get her scheduled for a consult with either Dr. Chase Caller or Dr. Vaughan Browner for ILD consult per [facility]. Unable to reach. Left message for her to return call.   When pt calls back, please schedule her a consult with either Dr. Vaughan Browner or Dr. Chase Caller for ILD workup.

## 2020-03-24 NOTE — Telephone Encounter (Signed)
Called and spoke with pt about the message from [place]. Pt verbalized understanding. appt scheduled for pt with Dr. Vaughan Browner 4/18. ILD questionnaire packet has been mailed to pt. Nothing further needed.

## 2020-03-27 ENCOUNTER — Other Ambulatory Visit: Payer: Self-pay | Admitting: Internal Medicine

## 2020-04-01 ENCOUNTER — Other Ambulatory Visit: Payer: Self-pay

## 2020-04-01 ENCOUNTER — Encounter: Payer: Self-pay | Admitting: Family Medicine

## 2020-04-01 ENCOUNTER — Ambulatory Visit (INDEPENDENT_AMBULATORY_CARE_PROVIDER_SITE_OTHER): Payer: Medicare Other | Admitting: Family Medicine

## 2020-04-01 VITALS — BP 116/67 | HR 87 | Ht 66.0 in | Wt 222.0 lb

## 2020-04-01 DIAGNOSIS — C50912 Malignant neoplasm of unspecified site of left female breast: Secondary | ICD-10-CM | POA: Diagnosis not present

## 2020-04-01 DIAGNOSIS — F419 Anxiety disorder, unspecified: Secondary | ICD-10-CM

## 2020-04-01 DIAGNOSIS — C7951 Secondary malignant neoplasm of bone: Secondary | ICD-10-CM

## 2020-04-01 MED ORDER — ALPRAZOLAM 0.5 MG PO TABS: 0.5000 mg | ORAL_TABLET | Freq: Two times a day (BID) | ORAL | 0 refills | Status: DC

## 2020-04-01 NOTE — Progress Notes (Signed)
Established Patient Office Visit  SUBJECTIVE:  Subjective  Patient ID: Alicia [name], female    DOB: 08/09/1950  Age: 70 y.o. MRN: [id number]  CC:  Chief Complaint  Patient presents with  . Anxiety    Patient needs refill of xanax    HPI Alicia [name] is a 70 y.o. female presenting today for     Past Medical History:  Diagnosis Date  . Anxiety   . Breast cancer, left ([place]) 07/2017  . Depression   . Hypertension   . Hypothyroidism   . Personal history of chemotherapy 2019   LEFT mastectomy-chemo before  . Personal history of radiation therapy 03/2018   LEFT mastectomy  . Rapid heart rate   . Thyroid disease     Past Surgical History:  Procedure Laterality Date  . AXILLARY LYMPH NODE BIOPSY Left 07/16/2017   METASTATIC MAMMARY CARCINOMA  . BREAST BIOPSY Left 07/16/2017   [place] bx of left breast mass and left breast LN.  INVASIVE MAMMARY CARCINOMA, NO SPECIAL TYPE.   Marland Kitchen BREAST EXCISIONAL BIOPSY Right 2001   benign  . BREAST LUMPECTOMY WITH SENTINEL LYMPH NODE BIOPSY Left 12/06/2017   Procedure: BREAST LUMPECTOMY WITH SENTINEL LYMPH NODE BX;  Surgeon: Robert Bellow, MD;  Location: [facility] ORS;  Service: General;  Laterality: Left;  . COLONOSCOPY    . MASTECTOMY Left 12/23/2017  . PORTACATH PLACEMENT Right 08/07/2017   Procedure: INSERTION PORT-A-CATH;  Surgeon: Robert Bellow, MD;  Location: [facility] ORS;  Service: General;  Laterality: Right;  . SIMPLE MASTECTOMY WITH AXILLARY SENTINEL NODE BIOPSY Left 12/23/2017   T2,N2 with 6/7 nodes positive. Whole breast radiation.  Surgeon: Robert Bellow, MD;  Location: [facility] ORS;  Service: General;  Laterality: Left;  . TONSILLECTOMY      Family History  Problem Relation Age of Onset  . Stroke Mother   . Thyroid disease Mother   . Renal Disease Mother   . Stroke Father   . Heart attack Father   . Sudden death Father 14       suicide  . Anuerysm Brother   . Breast cancer Neg Hx     Social History    Socioeconomic History  . Marital status: Widowed    Spouse name: Not on file  . Number of children: Not on file  . Years of education: Not on file  . Highest education level: Not on file  Occupational History  . Not on file  Tobacco Use  . Smoking status: Former Smoker    Packs/day: 1.00    Years: 30.00    Pack years: 30.00    Types: Cigarettes    Quit date: 2019    Years since quitting: 3.2  . Smokeless tobacco: Never Used  Vaping Use  . Vaping Use: Never used  Substance and Sexual Activity  . Alcohol use: Not Currently  . Drug use: Never  . Sexual activity: Not Currently  Other Topics Concern  . Not on file  Social History Narrative  . Not on file   Social Determinants of Health   Financial Resource Strain: Not on file  Food Insecurity: Not on file  Transportation Needs: Not on file  Physical Activity: Not on file  Stress: Not on file  Social Connections: Not on file  Intimate Partner Violence: Not on file     Current Outpatient Medications:  .  acetaminophen (TYLENOL) 500 MG tablet, Take 1,000 mg by mouth every 6 (six) hours as needed for moderate  pain or headache. , Disp: , Rfl:  .  ALPRAZolam (XANAX) 0.5 MG tablet, Take 1 tablet (0.5 mg total) by mouth 2 (two) times daily., Disp: 60 tablet, Rfl: 0 .  amLODipine (NORVASC) 5 MG tablet, TAKE 1 TABLET BY MOUTH DAILY, Disp: 90 tablet, Rfl: 3 .  aspirin EC 81 MG tablet, Take 81 mg by mouth at bedtime. , Disp: , Rfl:  .  calcium carbonate (OS-CAL - DOSED IN MG OF ELEMENTAL CALCIUM) 1250 (500 Ca) MG tablet, Take 1 tablet by mouth daily with breakfast., Disp: , Rfl:  .  Cholecalciferol (VITAMIN D3) 125 MCG (5000 UT) CAPS, Take 1 capsule by mouth 2 (two) times a day., Disp: , Rfl:  .  docusate sodium (COLACE) 100 MG capsule, Take 100 mg by mouth 3 (three) times daily as needed for mild constipation., Disp: , Rfl:  .  hydrochlorothiazide (HYDRODIURIL) 12.5 MG tablet, Take 1 tablet (12.5 mg total) by mouth daily., Disp:  90 tablet, Rfl: 3 .  levothyroxine (SYNTHROID) 100 MCG tablet, TAKE 1 TABLET BY MOUTH DAILY, Disp: 90 tablet, Rfl: 3 .  losartan (COZAAR) 100 MG tablet, Take 1 tablet (100 mg total) by mouth daily., Disp: 90 tablet, Rfl: 3 .  ondansetron (ZOFRAN) 8 MG tablet, Take 1 tablet (8 mg total) by mouth every 8 (eight) hours as needed for nausea or vomiting., Disp: 20 tablet, Rfl: 0 .  palbociclib (IBRANCE) 125 MG tablet, Take 1 tablet (125 mg total) by mouth daily. Take for 21 days on, 7 days off, repeat every 28 days., Disp: 21 tablet, Rfl: 3 .  rosuvastatin (CRESTOR) 5 MG tablet, TAKE 1 TABLET BY MOUTH DAILY, Disp: 30 tablet, Rfl: 6 .  traZODone (DESYREL) 50 MG tablet, TAKE 1 TABLET BY MOUTH DAILY, Disp: 30 tablet, Rfl: 6 .  metoprolol tartrate (LOPRESSOR) 25 MG tablet, Take 1 tablet (25 mg total) by mouth once for 1 dose. Take 2 hours prior to CTA., Disp: 1 tablet, Rfl: 0   Allergies  Allergen Reactions  . Sulfa Antibiotics Diarrhea    ROS Review of Systems  Constitutional: Negative.   HENT: Negative.   Respiratory: Negative.   Cardiovascular: Negative.   Neurological: Negative.   Psychiatric/Behavioral: Positive for agitation.     OBJECTIVE:    Physical Exam Vitals and nursing note reviewed.  Constitutional:      Appearance: She is obese.  HENT:     Head: Normocephalic.     Nose: Nose normal.  Cardiovascular:     Rate and Rhythm: Normal rate and regular rhythm.  Pulmonary:     Effort: Pulmonary effort is normal.  Musculoskeletal:        General: Normal range of motion.  Skin:    General: Skin is warm.  Neurological:     Mental Status: She is alert.  Psychiatric:        Mood and Affect: Mood normal.     BP 116/67   Pulse 87   Ht 5\' 6"  (1.676 m)   Wt 222 lb (100.7 kg)   BMI 35.83 kg/m  Wt Readings from Last 3 Encounters:  04/01/20 222 lb (100.7 kg)  03/17/20 224 lb 3.2 oz (101.7 kg)  03/04/20 229 lb 1.6 oz (103.9 kg)    Health Maintenance Due  Topic Date Due  .  Hepatitis C Screening  Never done  . COVID-19 Vaccine (1) Never done  . TETANUS/TDAP  Never done  . COLONOSCOPY (Pts 45-38yrs Insurance coverage will need to be confirmed)  Never  done  . PNA vac Low Risk Adult (1 of 2 - PCV13) Never done    There are no preventive care reminders to display for this patient.  CBC Latest Ref Rng & Units 03/17/2020 02/18/2020 01/21/2020  WBC 4.0 - 10.5 K/uL 2.3(L) 1.9(L) 2.3(L)  Hemoglobin 12.0 - 15.0 g/dL 11.2(L) 11.1(L) 11.9(L)  Hematocrit 36.0 - 46.0 % 33.0(L) 31.3(L) 33.5(L)  Platelets 150 - 400 K/uL 148(L) 160 150   CMP Latest Ref Rng & Units 03/17/2020 02/18/2020 01/21/2020  Glucose 70 - 99 mg/dL 108(H) 106(H) 103(H)  BUN 8 - 23 mg/dL 21 17 19   Creatinine 0.44 - 1.00 mg/dL 1.40(H) 1.11(H) 1.19(H)  Sodium 135 - 145 mmol/L 136 138 135  Potassium 3.5 - 5.1 mmol/L 3.6 4.2 3.7  Chloride 98 - 111 mmol/L 101 103 101  CO2 22 - 32 mmol/L 24 26 26   Calcium 8.9 - 10.3 mg/dL 8.8(L) 9.4 9.2  Total Protein 6.5 - 8.1 g/dL 7.1 7.1 7.4  Total Bilirubin 0.3 - 1.2 mg/dL 0.7 0.6 0.9  Alkaline Phos 38 - 126 U/L 69 68 70  AST 15 - 41 U/L 22 20 21   ALT 0 - 44 U/L 15 15 17     Lab Results  Component Value Date   TSH 0.925 10/27/2019   Lab Results  Component Value Date   ALBUMIN 4.2 03/17/2020   ANIONGAP 11 03/17/2020   Lab Results  Component Value Date   CHOL 202 (H) 12/15/2019   CHOL 151 02/18/2015   HDL 104 12/15/2019   HDL 65 02/18/2015   LDLCALC 87 12/15/2019   LDLCALC 76 02/18/2015   CHOLHDL 1.9 12/15/2019   CHOLHDL 2.3 02/18/2015   Lab Results  Component Value Date   TRIG 60 12/15/2019   Lab Results  Component Value Date   HGBA1C 5.9 (H) 10/04/2017   HGBA1C 5.6 02/18/2015      ASSESSMENT & PLAN:   Problem List Items Addressed This Visit      Musculoskeletal and Integument   Carcinoma of breast metastatic to bone [facility])    Patient was evaluated earlier this month by Oncology, still getting Chemo injections.         Other   Anxiety - Primary     Been taking Xanax daily 1/2 during day and whole pill at night. All symptoms controlled. Depression also controlled with the Xanax.   Plan- Refill Xanax today.          No orders of the defined types were placed in this encounter.     Follow-up: No follow-ups on file.    Beckie Salts, [place] [address], [place], [place] [address]

## 2020-04-01 NOTE — Assessment & Plan Note (Signed)
Been taking Xanax daily 1/2 during day and whole pill at night. All symptoms controlled. Depression also controlled with the Xanax.   Plan- Refill Xanax today.

## 2020-04-01 NOTE — Assessment & Plan Note (Signed)
Patient was evaluated earlier this month by Oncology, still getting Chemo injections.

## 2020-04-11 MED FILL — IBRANCE 125 MG TABS: 125 | 28 days supply | Qty: 21 | Fill #2

## 2020-04-12 NOTE — Progress Notes (Signed)
Medicare will not cover dx for nutrition consult, would recommend referral to healthy weight and wellness if patient is still interested

## 2020-04-19 ENCOUNTER — Other Ambulatory Visit (HOSPITAL_COMMUNITY): Payer: Self-pay

## 2020-04-19 ENCOUNTER — Inpatient Hospital Stay: Payer: Medicare Other | Attending: Oncology

## 2020-04-19 ENCOUNTER — Inpatient Hospital Stay: Payer: Medicare Other | Admitting: Pharmacist

## 2020-04-19 ENCOUNTER — Inpatient Hospital Stay: Payer: Medicare Other

## 2020-04-19 VITALS — BP 105/57 | HR 57 | Temp 97.9°F | Wt 221.6 lb

## 2020-04-19 DIAGNOSIS — C50912 Malignant neoplasm of unspecified site of left female breast: Secondary | ICD-10-CM

## 2020-04-19 DIAGNOSIS — C7951 Secondary malignant neoplasm of bone: Secondary | ICD-10-CM

## 2020-04-19 DIAGNOSIS — C50919 Malignant neoplasm of unspecified site of unspecified female breast: Secondary | ICD-10-CM

## 2020-04-19 DIAGNOSIS — Z5111 Encounter for antineoplastic chemotherapy: Secondary | ICD-10-CM | POA: Diagnosis not present

## 2020-04-19 DIAGNOSIS — Z79899 Other long term (current) drug therapy: Secondary | ICD-10-CM | POA: Insufficient documentation

## 2020-04-19 LAB — CBC WITH DIFFERENTIAL/PLATELET
Abs Immature Granulocytes: 0.01 10*3/uL (ref 0.00–0.07)
Basophils Absolute: 0 10*3/uL (ref 0.0–0.1)
Basophils Relative: 2 %
Eosinophils Absolute: 0 10*3/uL (ref 0.0–0.5)
Eosinophils Relative: 1 %
HCT: 32.1 % — ABNORMAL LOW (ref 36.0–46.0)
Hemoglobin: 11.2 g/dL — ABNORMAL LOW (ref 12.0–15.0)
Immature Granulocytes: 0 %
Lymphocytes Relative: 16 %
Lymphs Abs: 0.4 10*3/uL — ABNORMAL LOW (ref 0.7–4.0)
MCH: 35.1 pg — ABNORMAL HIGH (ref 26.0–34.0)
MCHC: 34.9 g/dL (ref 30.0–36.0)
MCV: 100.6 fL — ABNORMAL HIGH (ref 80.0–100.0)
Monocytes Absolute: 0.5 10*3/uL (ref 0.1–1.0)
Monocytes Relative: 18 %
Neutro Abs: 1.7 10*3/uL (ref 1.7–7.7)
Neutrophils Relative %: 63 %
Platelets: 193 10*3/uL (ref 150–400)
RBC: 3.19 MIL/uL — ABNORMAL LOW (ref 3.87–5.11)
RDW: 14.2 % (ref 11.5–15.5)
Smear Review: NORMAL
WBC: 2.6 10*3/uL — ABNORMAL LOW (ref 4.0–10.5)
nRBC: 0 % (ref 0.0–0.2)

## 2020-04-19 LAB — COMPREHENSIVE METABOLIC PANEL
ALT: 17 U/L (ref 0–44)
AST: 22 U/L (ref 15–41)
Albumin: 4.1 g/dL (ref 3.5–5.0)
Alkaline Phosphatase: 66 U/L (ref 38–126)
Anion gap: 11 (ref 5–15)
BUN: 27 mg/dL — ABNORMAL HIGH (ref 8–23)
CO2: 25 mmol/L (ref 22–32)
Calcium: 9.9 mg/dL (ref 8.9–10.3)
Chloride: 100 mmol/L (ref 98–111)
Creatinine, Ser: 1.76 mg/dL — ABNORMAL HIGH (ref 0.44–1.00)
GFR, Estimated: 31 mL/min — ABNORMAL LOW (ref >60)
Glucose, Bld: 120 mg/dL — ABNORMAL HIGH (ref 70–99)
Potassium: 3.8 mmol/L (ref 3.5–5.1)
Sodium: 136 mmol/L (ref 135–145)
Total Bilirubin: 0.8 mg/dL (ref 0.3–1.2)
Total Protein: 7.2 g/dL (ref 6.5–8.1)

## 2020-04-19 MED ORDER — DENOSUMAB 120 MG/1.7ML ~~LOC~~ SOLN
120.0000 mg | Freq: Once | SUBCUTANEOUS | Status: AC
  Administered 2020-04-19: 120 mg via SUBCUTANEOUS
  Filled 2020-04-19: qty 1.7

## 2020-04-19 MED ORDER — FULVESTRANT 250 MG/5ML IM SOLN
500.0000 mg | Freq: Once | INTRAMUSCULAR | Status: AC
  Administered 2020-04-19: 500 mg via INTRAMUSCULAR
  Filled 2020-04-19: qty 10

## 2020-04-19 NOTE — Progress Notes (Signed)
[place]  [phone] [phone]) [phone]  Patient Care Team: [name], [name], Harrell Gave, [name] Rico Junker, RN as Registered Nurse Grayland Ormond Kathlene November, MD as Consulting Physician (Oncology) Bary Castilla Forest Gleason, MD as Consulting Physician (General Surgery) Bary Castilla Forest Gleason, MD as Consulting Physician (General Surgery) Noreene Filbert, MD as Referring Physician (Radiation Oncology) Jeral Fruit, RN as Registered Nurse   Name of the patient: Alicia [name]  [id number]  09-Sep-1950   Date of visit: 04/19/20  HPI: Patient is a 70 y.o. female with recurrent stage IV ER/PR positive, HER2 negative breast cancer. Patient started on Ibrance on 07/09/19.  Reason for Consult: Oral chemotherapy follow-up for Ibrance (palbociclib) therapy, s/p cycle 10.   PAST MEDICAL HISTORY: Past Medical History:  Diagnosis Date  . Anxiety   . Breast cancer, left ([place]) 07/2017  . Depression   . Hypertension   . Hypothyroidism   . Personal history of chemotherapy 2019   LEFT mastectomy-chemo before  . Personal history of radiation therapy 03/2018   LEFT mastectomy  . Rapid heart rate   . Thyroid disease     PAST SURGICAL HISTORY:  Past Surgical History:  Procedure Laterality Date  . AXILLARY LYMPH NODE BIOPSY Left 07/16/2017   METASTATIC MAMMARY CARCINOMA  . BREAST BIOPSY Left 07/16/2017   [place] bx of left breast mass and left breast LN.  INVASIVE MAMMARY CARCINOMA, NO SPECIAL TYPE.   Marland Kitchen BREAST EXCISIONAL BIOPSY Right 2001   benign  . BREAST LUMPECTOMY WITH SENTINEL LYMPH NODE BIOPSY Left 12/06/2017   Procedure: BREAST LUMPECTOMY WITH SENTINEL LYMPH NODE BX;  Surgeon: Robert Bellow, MD;  Location: [facility] ORS;  Service: General;  Laterality: Left;  . COLONOSCOPY    . MASTECTOMY Left 12/23/2017  . PORTACATH PLACEMENT Right 08/07/2017   Procedure:  INSERTION PORT-A-CATH;  Surgeon: Robert Bellow, MD;  Location: [facility] ORS;  Service: General;  Laterality: Right;  . SIMPLE MASTECTOMY WITH AXILLARY SENTINEL NODE BIOPSY Left 12/23/2017   T2,N2 with 6/7 nodes positive. Whole breast radiation.  Surgeon: Robert Bellow, MD;  Location: [facility] ORS;  Service: General;  Laterality: Left;  . TONSILLECTOMY      HEMATOLOGY/ONCOLOGY HISTORY:  Oncology History Overview Note  Patient is a 70 year old female who recently self palpated a mass on her left breast.  Subsequent imaging and biopsy revealed the above-stated breast cancer.  Case was also discussed extensively at case conference.  Given the size and the stage of patient's malignancy, she will benefit from neoadjuvant chemotherapy using Adriamycin, Cytoxan, and Taxol.  Patient will also require Neulasta support.  Will get CT scan of the chest, abdomen, and pelvis to assess for any metastatic disease.  Patient will also require port placement and MUGA prior to initiating treatment  CT abdomen/pelvis/chest did not reveal any suspicious lesions concerning for metastatic disease. (08/01/17)  Port-A-Cath placed on 08/07/2017.  Cycle 1 day 1 of AC was given on 08/08/17.     Breast cancer, stage 2, left ([place])  07/24/2017 Initial Diagnosis   Breast cancer, stage 2, left ([place])   07/30/2017 Cancer Staging   Staging form: Breast, AJCC 8th Edition - Clinical stage from 07/30/2017: Stage IIA (cT2, cN1, cM0, G2, ER+, PR+, HER2-) - Signed by Lloyd Huger, MD on 07/30/2017   08/08/2017 - 10/31/2017 Chemotherapy   The patient had DOXOrubicin (ADRIAMYCIN) chemo injection 130 mg, 60 mg/m2 = 130 mg, Intravenous,  Once, 4 of 4 cycles Administration: 130 mg (08/08/2017), 130 mg (08/22/2017), 130 mg (09/05/2017), 130 mg (09/19/2017) palonosetron (ALOXI) injection 0.25 mg, 0.25 mg, Intravenous,  Once, 4 of 4 cycles Administration: 0.25 mg (08/08/2017), 0.25 mg (08/22/2017), 0.25 mg (09/05/2017), 0.25 mg  (09/19/2017) pegfilgrastim-cbqv (UDENYCA) injection 6 mg, 6 mg, Subcutaneous, Once, 4 of 4 cycles Administration: 6 mg (08/09/2017), 6 mg (08/23/2017), 6 mg (09/06/2017), 6 mg (09/20/2017) cyclophosphamide (CYTOXAN) 1,300 mg in sodium chloride 0.9 % 250 mL chemo infusion, 600 mg/m2 = 1,300 mg, Intravenous,  Once, 4 of 4 cycles Administration: 1,300 mg (08/08/2017), 1,300 mg (08/22/2017), 1,300 mg (09/05/2017), 1,300 mg (09/19/2017) PACLitaxel (TAXOL) 174 mg in sodium chloride 0.9 % 250 mL chemo infusion (</= 57m/m2), 80 mg/m2 = 174 mg, Intravenous,  Once, 4 of 12 cycles Dose modification: 72 mg/m2 (original dose 80 mg/m2, Cycle 6, Reason: Dose not tolerated) Administration: 174 mg (10/03/2017), 156 mg (10/17/2017), 156 mg (10/24/2017), 156 mg (10/31/2017) fosaprepitant (EMEND) 150 mg, dexamethasone (DECADRON) 12 mg in sodium chloride 0.9 % 145 mL IVPB, , Intravenous,  Once, 4 of 4 cycles Administration:  (08/08/2017),  (08/22/2017),  (09/05/2017),  (09/19/2017)  for chemotherapy treatment.      ALLERGIES:  is allergic to sulfa antibiotics.  MEDICATIONS:  Current Outpatient Medications  Medication Sig Dispense Refill  . acetaminophen (TYLENOL) 500 MG tablet Take 1,000 mg by mouth every 6 (six) hours as needed for moderate pain or headache.     . ALPRAZolam (XANAX) 0.5 MG tablet Take 1 tablet (0.5 mg total) by mouth 2 (two) times daily. 60 tablet 0  . amLODipine (NORVASC) 5 MG tablet TAKE 1 TABLET BY MOUTH DAILY 90 tablet 3  . aspirin EC 81 MG tablet Take 81 mg by mouth at bedtime.     . calcium carbonate (OS-CAL - DOSED IN MG OF ELEMENTAL CALCIUM) 1250 (500 Ca) MG tablet Take 1 tablet by mouth daily with breakfast.    . Cholecalciferol (VITAMIN D3) 125 MCG (5000 UT) CAPS Take 1 capsule by mouth 2 (two) times a day.    . docusate sodium (COLACE) 100 MG capsule Take 100 mg by mouth 3 (three) times daily as needed for mild constipation.    . hydrochlorothiazide (HYDRODIURIL) 12.5 MG tablet Take 1 tablet (12.5 mg  total) by mouth daily. 90 tablet 3  . levothyroxine (SYNTHROID) 100 MCG tablet TAKE 1 TABLET BY MOUTH DAILY 90 tablet 3  . losartan (COZAAR) 100 MG tablet Take 1 tablet (100 mg total) by mouth daily. 90 tablet 3  . metoprolol tartrate (LOPRESSOR) 25 MG tablet Take 1 tablet (25 mg total) by mouth once for 1 dose. Take 2 hours prior to CTA. 1 tablet 0  . ondansetron (ZOFRAN) 8 MG tablet Take 1 tablet (8 mg total) by mouth every 8 (eight) hours as needed for nausea or vomiting. 20 tablet 0  . palbociclib (IBRANCE) 125 MG tablet TAKE 1 TABLET (125 MG TOTAL) BY MOUTH DAILY. TAKE FOR 21 DAYS ON, 7 DAYS OFF, REPEAT EVERY 28 DAYS. 21 tablet 3  . rosuvastatin (CRESTOR) 5 MG tablet TAKE 1 TABLET BY MOUTH DAILY 30 tablet 6  . traZODone (DESYREL) 50 MG tablet TAKE 1 TABLET BY MOUTH DAILY 30 tablet 6   No current facility-administered medications for this visit.    VITAL SIGNS: There were no vitals taken for this visit. There were no vitals filed for this visit.  Estimated body mass index is 35.83 kg/m as calculated from the following:   Height as  of 04/01/20: 5' 6"  (1.676 m).   Weight as of 04/01/20: 100.7 kg (222 lb).  LABS: CBC:    Component Value Date/Time   WBC 2.3 (L) 03/17/2020 1339   HGB 11.2 (L) 03/17/2020 1339   HGB 14.2 02/11/2011 1349   HCT 33.0 (L) 03/17/2020 1339   HCT 40.9 02/11/2011 1349   PLT 148 (L) 03/17/2020 1339   PLT 151 02/11/2011 1349   MCV 103.1 (H) 03/17/2020 1339   MCV 89 02/11/2011 1349   NEUTROABS 1.3 (L) 03/17/2020 1339   LYMPHSABS 0.5 (L) 03/17/2020 1339   MONOABS 0.4 03/17/2020 1339   EOSABS 0.0 03/17/2020 1339   BASOSABS 0.0 03/17/2020 1339   Comprehensive Metabolic Panel:    Component Value Date/Time   NA 136 03/17/2020 1339   NA 142 02/11/2011 1349   K 3.6 03/17/2020 1339   K 3.8 02/11/2011 1349   CL 101 03/17/2020 1339   CL 107 02/11/2011 1349   CO2 24 03/17/2020 1339   CO2 24 02/11/2011 1349   BUN 21 03/17/2020 1339   BUN 14 02/11/2011 1349    CREATININE 1.40 (H) 03/17/2020 1339   CREATININE 0.65 02/11/2011 1349   GLUCOSE 108 (H) 03/17/2020 1339   GLUCOSE 98 02/11/2011 1349   CALCIUM 8.8 (L) 03/17/2020 1339   CALCIUM 9.0 02/11/2011 1349   AST 22 03/17/2020 1339   AST 27 02/11/2011 1349   ALT 15 03/17/2020 1339   ALT 36 02/11/2011 1349   ALKPHOS 69 03/17/2020 1339   ALKPHOS 122 02/11/2011 1349   BILITOT 0.7 03/17/2020 1339   BILITOT 0.5 02/11/2011 1349   PROT 7.1 03/17/2020 1339   PROT 7.0 02/11/2011 1349   ALBUMIN 4.2 03/17/2020 1339   ALBUMIN 4.1 02/11/2011 1349    RADIOGRAPHIC STUDIES: CT CORONARY MORPH W/CTA COR W/SCORE W/CA W/CM &/OR WO/CM  Addendum Date: 03/10/2020   ADDENDUM REPORT: 03/10/2020 16:34 EXAM: OVER-READ INTERPRETATION  CT CHEST The following report is an over-read performed by radiologist Dr. Rebekah Chesterfield [facility] on 03/10/2020. This over-read does not include interpretation of cardiac or coronary anatomy or pathology. The coronary calcium score and cardiac CTA interpretation by the cardiologist is attached. COMPARISON:  None. FINDINGS: Patchy areas of ground-glass attenuation and septal thickening noted in the lungs bilaterally, similar to the prior study, likely to reflect mild interstitial lung disease. Subpleural reticulation deep to the left breast in the anterior aspect of the left upper lobe also most compatible with an area of postradiation fibrosis. Aortic atherosclerosis. Calcified granuloma in the right lower lobe. Within the visualized portions of the thorax there are no other suspicious appearing pulmonary nodules or masses, there is no acute consolidative airspace disease, no pleural effusions, no pneumothorax and no lymphadenopathy. Visualized portions of the upper abdomen are unremarkable. There are no aggressive appearing lytic or blastic lesions noted in the visualized portions of the skeleton. Postoperative changes of left modified radical mastectomy. IMPRESSION: 1. The  appearance of the lungs suggests probable interstitial lung disease. Follow-up nonemergent high-resolution chest CT is suggested in the near future for better evaluation. 2. Aortic Atherosclerosis (ICD10-I70.0). Electronically Signed   By: Vinnie Langton M.D.   On: 03/10/2020 16:34   Result Date: 03/10/2020 CLINICAL DATA:  Chestpain EXAM: Cardiac/Coronary  CTA TECHNIQUE: The patient was scanned on a Siemens Somatoform go.Top scanner. FINDINGS: A retrospective scan was triggered in the descending thoracic aorta. Axial non-contrast 3 mm slices were carried out through the heart. The data set was analyzed on a dedicated work  station and scored using the Agatson method. Gantry rotation speed was 330 msecs and collimation was .6 mm. 92m of metoprolol and 0.8 mg of sl NTG was given. The 3D data set was reconstructed in 5% intervals of the 60-95 % of the R-R cycle. Diastolic phases were analyzed on a dedicated work station using MPR, MIP and VRT modes. The patient received 100 cc of contrast. Aorta:  Normal size.  No calcifications.  No dissection. Aortic Valve:  Trileaflet.  No calcifications. Coronary Arteries:  Normal coronary origin.  Right dominance. RCA is a dominant artery that gives rise to PDA and PLA. There is no plaque. Left main is a large artery that gives rise to LAD and LCX arteries. LAD is has no plaque. Diagonal vessel did not show any evidence of disease LCX is a non-dominant artery that gives rise to three obtuse marginal branches. There is no plaque. Other findings: Normal pulmonary vein drainage into the left atrium. Normal left atrial appendage without a thrombus. Normal size of the pulmonary artery. IMPRESSION: 1. Very low coronary calcium score of 2.12. Minimal calcification located in the first diagonal branch. This was 41st percentile for age and sex matched control. 2. Normal coronary origin with right dominance. 3. No evidence of obstructive CAD. 4. Consider non-atherosclerotic causes of  chest pain. Electronically Signed: By: BKate SableM.D. On: 03/10/2020 15:53     Assessment and Plan-  Reviewed CBC, CMP, and CA 27.29 (from 03/17/20). Patient started her cycle 11 on 04/15/20 okay to continue.    Oral Chemotherapy Side Effect/Intolerance:  - Bowel movements: constipation is much improved, her bowels because a bit too loose and she backed off on the Miralax and they returned to normal - Fatigue: so noted that she has had increased fatigue since her last appt. Compared to her normal, she appeared to have lower energy during her visit today. Her fatigue is not constant, she has good days and bad days. She does not notice an improvement during her palbociclib off week.  When asked about allergies, she reported some eye itching and congestion. She believes the pollen is affecting her. She is not currently taking anything for allergies. Recommended that she start an antihistamine and nasal spray for her allergies. Likely this new onset fatigue is related to her allergies. Will continue to follow.  - Mouth sores: reports occasional mouth sores, prescription faxed to her local pharmacy for magic mouthwash to use as needed  Oral Chemotherapy Adherence: No reported missed doses  Medication Access Issues: no issues, [facility]   Patient expressed understanding and was in agreement with this plan. She also understands that She can call clinic at any time with any questions, concerns, or complaints.   Thank you for allowing me to participate in the care of this very pleasant patient.   Time Total: 20 mins  Visit consisted of counseling and education on dealing with issues of symptom management in the setting of serious and potentially life-threatening illness.Greater than 50%  of this time was spent counseling and coordinating care related to the above assessment and plan.  Signed by: ADarl Pikes PharmD, BCPS, BSalley Slaughter CPP Hematology/Oncology  Clinical Pharmacist Practitioner [phone] 04/19/2020 1:20 PM

## 2020-04-20 ENCOUNTER — Other Ambulatory Visit (HOSPITAL_COMMUNITY): Payer: Self-pay

## 2020-04-20 MED ORDER — PALBOCICLIB 125 MG PO TABS
125.0000 mg | ORAL_TABLET | Freq: Every day | ORAL | 3 refills | Status: DC
  Filled 2020-04-20: qty 21, 21d supply, fill #0
  Filled 2020-06-03: qty 21, 28d supply, fill #1
  Filled 2020-06-30: qty 21, 28d supply, fill #2
  Filled 2020-07-27: qty 21, 28d supply, fill #3

## 2020-04-29 ENCOUNTER — Other Ambulatory Visit (HOSPITAL_COMMUNITY): Payer: Self-pay

## 2020-04-30 ENCOUNTER — Other Ambulatory Visit (HOSPITAL_COMMUNITY): Payer: Self-pay

## 2020-05-02 ENCOUNTER — Institutional Professional Consult (permissible substitution): Payer: Medicare Other | Admitting: Pulmonary Disease

## 2020-05-02 ENCOUNTER — Ambulatory Visit: Payer: Medicare Other | Admitting: Internal Medicine

## 2020-05-03 ENCOUNTER — Other Ambulatory Visit (HOSPITAL_COMMUNITY): Payer: Self-pay

## 2020-05-03 MED ORDER — PREDNISOLONE SODIUM PHOSPHATE 15 MG/5ML PO SOLN
OROMUCOSAL | 3 refills | Status: DC
  Filled 2020-05-03 – 2020-05-05 (×3): qty 480, 12d supply, fill #0

## 2020-05-04 ENCOUNTER — Other Ambulatory Visit (HOSPITAL_COMMUNITY): Payer: Self-pay

## 2020-05-05 ENCOUNTER — Ambulatory Visit (INDEPENDENT_AMBULATORY_CARE_PROVIDER_SITE_OTHER): Payer: Medicare Other | Admitting: Internal Medicine

## 2020-05-05 ENCOUNTER — Other Ambulatory Visit (HOSPITAL_COMMUNITY): Payer: Self-pay

## 2020-05-05 ENCOUNTER — Encounter: Payer: Self-pay | Admitting: Internal Medicine

## 2020-05-05 ENCOUNTER — Other Ambulatory Visit: Payer: Self-pay

## 2020-05-05 VITALS — BP 105/69 | HR 73 | Ht 66.0 in | Wt 221.5 lb

## 2020-05-05 DIAGNOSIS — F419 Anxiety disorder, unspecified: Secondary | ICD-10-CM | POA: Diagnosis not present

## 2020-05-05 DIAGNOSIS — C7951 Secondary malignant neoplasm of bone: Secondary | ICD-10-CM | POA: Diagnosis not present

## 2020-05-05 DIAGNOSIS — K219 Gastro-esophageal reflux disease without esophagitis: Secondary | ICD-10-CM

## 2020-05-05 DIAGNOSIS — I471 Supraventricular tachycardia: Secondary | ICD-10-CM

## 2020-05-05 DIAGNOSIS — E669 Obesity, unspecified: Secondary | ICD-10-CM | POA: Diagnosis not present

## 2020-05-05 DIAGNOSIS — I10 Essential (primary) hypertension: Secondary | ICD-10-CM | POA: Diagnosis not present

## 2020-05-05 DIAGNOSIS — C50912 Malignant neoplasm of unspecified site of left female breast: Secondary | ICD-10-CM

## 2020-05-05 MED ORDER — ALPRAZOLAM 0.5 MG PO TABS: 0.5000 mg | ORAL_TABLET | Freq: Two times a day (BID) | ORAL | 0 refills | Status: DC

## 2020-05-05 NOTE — Assessment & Plan Note (Signed)
-   The patient's GERD is stable on medication.  - Instructed the patient to avoid eating spicy and acidic foods, as well as foods high in fat. - Instructed the patient to avoid eating large meals or meals 2-3 hours prior to sleeping. 

## 2020-05-05 NOTE — Assessment & Plan Note (Signed)
Patient blood pressure is normal patient denies any chest pain or shortness of breath there is no history of palpitation paroxysmal nocturnal dyspnea patient can walk  25yards without any problem patient was advised to follow low-salt low-cholesterol diet  I reviewed the results of Sprint trial  ideally I want to keep systolic blood pressure below 130 mmHg, patient was asked to check blood pressure 3 times a week and give me a report on that.  Patient will be follow-up in 3 months, patient will call me back for any change in the cardiovascular symptoms    

## 2020-05-05 NOTE — Assessment & Plan Note (Signed)
On chemotherapy

## 2020-05-05 NOTE — Assessment & Plan Note (Signed)
Stable at present time

## 2020-05-05 NOTE — Assessment & Plan Note (Signed)
-   I encouraged the patient to lose weight.  - I educated them on making healthy dietary choices including eating more fruits and vegetables and less fried foods. - I encouraged the patient to exercise more, and educated on the benefits of exercise including weight loss, diabetes prevention, and hypertension prevention.   Dietary counseling with a registered dietician  Referral to a weight management support group (e.g. Weight Watchers, Overeaters Anonymous)  If your BMI is greater than 29 or you have gained more than 15 pounds you should work on weight loss.  Attend a healthy cooking class  

## 2020-05-05 NOTE — Progress Notes (Signed)
Established Patient Office Visit  Subjective:  Patient ID: Alicia [name], female    DOB: [birth year]  Age: 70 y.o. MRN: [id number]  CC:  Chief Complaint  Patient presents with  . Hypertension    HPI  Alicia [name] presents for check up  Past Medical History:  Diagnosis Date  . Anxiety   . Breast cancer, left ([place]) 07/2017  . Depression   . Hypertension   . Hypothyroidism   . Personal history of chemotherapy 2019   LEFT mastectomy-chemo before  . Personal history of radiation therapy 03/2018   LEFT mastectomy  . Rapid heart rate   . Thyroid disease     Past Surgical History:  Procedure Laterality Date  . AXILLARY LYMPH NODE BIOPSY Left 07/16/2017   METASTATIC MAMMARY CARCINOMA  . BREAST BIOPSY Left 07/16/2017   [place] bx of left breast mass and left breast LN.  INVASIVE MAMMARY CARCINOMA, NO SPECIAL TYPE.   Marland Kitchen BREAST EXCISIONAL BIOPSY Right 2001   benign  . BREAST LUMPECTOMY WITH SENTINEL LYMPH NODE BIOPSY Left 12/06/2017   Procedure: BREAST LUMPECTOMY WITH SENTINEL LYMPH NODE BX;  Surgeon: Robert Bellow, MD;  Location: [facility] ORS;  Service: General;  Laterality: Left;  . COLONOSCOPY    . MASTECTOMY Left 12/23/2017  . PORTACATH PLACEMENT Right 08/07/2017   Procedure: INSERTION PORT-A-CATH;  Surgeon: Robert Bellow, MD;  Location: [facility] ORS;  Service: General;  Laterality: Right;  . SIMPLE MASTECTOMY WITH AXILLARY SENTINEL NODE BIOPSY Left 12/23/2017   T2,N2 with 6/7 nodes positive. Whole breast radiation.  Surgeon: Robert Bellow, MD;  Location: [facility] ORS;  Service: General;  Laterality: Left;  . TONSILLECTOMY      Family History  Problem Relation Age of Onset  . Stroke Mother   . Thyroid disease Mother   . Renal Disease Mother   . Stroke Father   . Heart attack Father   . Sudden death Father 104       suicide  . Anuerysm Brother   . Breast cancer Neg Hx     Social History   Socioeconomic History  . Marital status: Widowed    Spouse name: Not  on file  . Number of children: Not on file  . Years of education: Not on file  . Highest education level: Not on file  Occupational History  . Not on file  Tobacco Use  . Smoking status: Former Smoker    Packs/day: 1.00    Years: 30.00    Pack years: 30.00    Types: Cigarettes    Quit date: 2019    Years since quitting: 3.3  . Smokeless tobacco: Never Used  Vaping Use  . Vaping Use: Never used  Substance and Sexual Activity  . Alcohol use: Not Currently  . Drug use: Never  . Sexual activity: Not Currently  Other Topics Concern  . Not on file  Social History Narrative  . Not on file   Social Determinants of Health   Financial Resource Strain: Not on file  Food Insecurity: Not on file  Transportation Needs: Not on file  Physical Activity: Not on file  Stress: Not on file  Social Connections: Not on file  Intimate Partner Violence: Not on file     Current Outpatient Medications:  .  acetaminophen (TYLENOL) 500 MG tablet, Take 1,000 mg by mouth every 6 (six) hours as needed for moderate pain or headache. , Disp: , Rfl:  .  ALPRAZolam (XANAX) 0.5 MG tablet, Take  1 tablet (0.5 mg total) by mouth 2 (two) times daily., Disp: 60 tablet, Rfl: 0 .  aspirin EC 81 MG tablet, Take 81 mg by mouth at bedtime. , Disp: , Rfl:  .  calcium carbonate (OS-CAL - DOSED IN MG OF ELEMENTAL CALCIUM) 1250 (500 Ca) MG tablet, Take 1 tablet by mouth daily with breakfast., Disp: , Rfl:  .  Cholecalciferol (VITAMIN D3) 125 MCG (5000 UT) CAPS, Take 1 capsule by mouth 2 (two) times a day., Disp: , Rfl:  .  docusate sodium (COLACE) 100 MG capsule, Take 100 mg by mouth 3 (three) times daily as needed for mild constipation., Disp: , Rfl:  .  hydrochlorothiazide (HYDRODIURIL) 12.5 MG tablet, Take 1 tablet (12.5 mg total) by mouth daily., Disp: 90 tablet, Rfl: 3 .  levothyroxine (SYNTHROID) 100 MCG tablet, TAKE 1 TABLET BY MOUTH DAILY, Disp: 90 tablet, Rfl: 3 .  losartan (COZAAR) 100 MG tablet, Take 1 tablet  (100 mg total) by mouth daily., Disp: 90 tablet, Rfl: 3 .  metoprolol tartrate (LOPRESSOR) 25 MG tablet, Take 1 tablet (25 mg total) by mouth once for 1 dose. Take 2 hours prior to CTA., Disp: 1 tablet, Rfl: 0 .  nystatin-lidocaine-prednisoLONE-diphenhydrAMINE-distilled water-alum & mag hydroxide-simeth, Swish and spit 5-48ml four times a day as needed, Disp: 480 mL, Rfl: 3 .  ondansetron (ZOFRAN) 8 MG tablet, Take 1 tablet (8 mg total) by mouth every 8 (eight) hours as needed for nausea or vomiting., Disp: 20 tablet, Rfl: 0 .  palbociclib (IBRANCE) 125 MG tablet, Take 1 tablet (125 mg total) by mouth daily. Take for 21 days, then hold for 7 days. Repeat every 28 days., Disp: 21 tablet, Rfl: 3 .  rosuvastatin (CRESTOR) 5 MG tablet, TAKE 1 TABLET BY MOUTH DAILY, Disp: 30 tablet, Rfl: 6 .  traZODone (DESYREL) 50 MG tablet, TAKE 1 TABLET BY MOUTH DAILY, Disp: 30 tablet, Rfl: 6   Allergies  Allergen Reactions  . Sulfa Antibiotics Diarrhea    ROS Review of Systems  Constitutional: Negative.   HENT: Negative.   Eyes: Negative.   Respiratory: Negative.   Cardiovascular: Negative.   Gastrointestinal: Negative.   Endocrine: Negative.   Genitourinary: Negative.   Musculoskeletal: Negative.   Skin: Negative.   Allergic/Immunologic: Negative.   Neurological: Negative.   Hematological: Negative.   Psychiatric/Behavioral: Negative.   All other systems reviewed and are negative.     Objective:    Physical Exam Vitals reviewed.  Constitutional:      Appearance: Normal appearance.  HENT:     Mouth/Throat:     Mouth: Mucous membranes are moist.  Eyes:     Pupils: Pupils are equal, round, and reactive to light.  Neck:     Vascular: No carotid bruit.  Cardiovascular:     Rate and Rhythm: Normal rate and regular rhythm.     Pulses: Normal pulses.     Heart sounds: Normal heart sounds.  Pulmonary:     Effort: Pulmonary effort is normal.     Breath sounds: Normal breath sounds.   Abdominal:     General: Bowel sounds are normal.     Palpations: Abdomen is soft. There is no hepatomegaly, splenomegaly or mass.     Tenderness: There is no abdominal tenderness.     Hernia: No hernia is present.  Musculoskeletal:        General: No tenderness.     Cervical back: Neck supple.     Right lower leg: No edema.  Left lower leg: No edema.  Skin:    Findings: No rash.  Neurological:     Mental Status: She is alert and oriented to person, place, and time.     Motor: No weakness.  Psychiatric:        Mood and Affect: Mood and affect normal.        Behavior: Behavior normal.     BP 105/69   Pulse 73   Ht 5\' 6"  (1.676 m)   Wt 221 lb 8 oz (100.5 kg)   BMI 35.75 kg/m  Wt Readings from Last 3 Encounters:  05/05/20 221 lb 8 oz (100.5 kg)  04/19/20 221 lb 9 oz (100.5 kg)  04/01/20 222 lb (100.7 kg)     Health Maintenance Due  Topic Date Due  . Hepatitis C Screening  Never done  . COVID-19 Vaccine (1) Never done  . TETANUS/TDAP  Never done  . COLONOSCOPY (Pts 45-42yrs Insurance coverage will need to be confirmed)  Never done  . PNA vac Low Risk Adult (1 of 2 - PCV13) Never done    There are no preventive care reminders to display for this patient.  Lab Results  Component Value Date   TSH 0.925 10/27/2019   Lab Results  Component Value Date   WBC 2.6 (L) 04/19/2020   HGB 11.2 (L) 04/19/2020   HCT 32.1 (L) 04/19/2020   MCV 100.6 (H) 04/19/2020   PLT 193 04/19/2020   Lab Results  Component Value Date   NA 136 04/19/2020   K 3.8 04/19/2020   CO2 25 04/19/2020   GLUCOSE 120 (H) 04/19/2020   BUN 27 (H) 04/19/2020   CREATININE 1.76 (H) 04/19/2020   BILITOT 0.8 04/19/2020   ALKPHOS 66 04/19/2020   AST 22 04/19/2020   ALT 17 04/19/2020   PROT 7.2 04/19/2020   ALBUMIN 4.1 04/19/2020   CALCIUM 9.9 04/19/2020   ANIONGAP 11 04/19/2020   Lab Results  Component Value Date   CHOL 202 (H) 12/15/2019   Lab Results  Component Value Date   HDL 104  12/15/2019   Lab Results  Component Value Date   LDLCALC 87 12/15/2019   Lab Results  Component Value Date   TRIG 60 12/15/2019   Lab Results  Component Value Date   CHOLHDL 1.9 12/15/2019   Lab Results  Component Value Date   HGBA1C 5.9 (H) 10/04/2017      Assessment & Plan:   Problem List Items Addressed This Visit      Cardiovascular and Mediastinum   Primary hypertension - Primary    Patient blood pressure is normal patient denies any chest pain or shortness of breath there is no history of palpitation paroxysmal nocturnal dyspnea patient can walk  25yards without any problem patient was advised to follow low-salt low-cholesterol diet  I reviewed the results of Sprint trial  ideally I want to keep systolic blood pressure below 130 mmHg, patient was asked to check blood pressure 3 times a week and give me a report on that.  Patient will be follow-up in 3 months, patient will call me back for any change in the cardiovascular symptoms         PSVT (paroxysmal supraventricular tachycardia) (HCC)    Stable at present time        Digestive   Gastroesophageal reflux disease without esophagitis    - The patient's GERD is stable on medication.  - Instructed the patient to avoid eating spicy and acidic foods, as well  as foods high in fat. - Instructed the patient to avoid eating large meals or meals 2-3 hours prior to sleeping.        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  - I educated them on making healthy dietary choices including eating more fruits and vegetables and less fried foods. - I encouraged the patient to exercise more, and educated on the benefits of exercise  including weight loss, diabetes prevention, and hypertension prevention.   Dietary counseling with a registered dietician  Referral to a weight management support group (e.g. Weight Watchers, Overeaters Anonymous)  If your BMI is greater than 29 or you have gained more than 15 pounds you should work on weight loss.  Attend a healthy cooking class          Meds ordered this encounter  Medications  . ALPRAZolam (XANAX) 0.5 MG tablet    Sig: Take 1 tablet (0.5 mg total) by mouth 2 (two) times daily.    Dispense:  60 tablet    Refill:  0    Follow-up: No follow-ups on file.    [name], MD

## 2020-05-06 ENCOUNTER — Other Ambulatory Visit (HOSPITAL_COMMUNITY): Payer: Self-pay

## 2020-05-08 NOTE — Progress Notes (Deleted)
[place]  [phone] [phone]  ID: DANIJELA VESSEY OB: 1950/09/18  MR#: [id number]  LDK#:[id number]  Patient Care Team: [name], [name], Harrell Gave, [name] Rico Junker, RN as Registered Nurse Lloyd Huger, MD as Consulting Physician (Oncology) Bary Castilla Forest Gleason, MD as Consulting Physician (General Surgery) Bary Castilla Forest Gleason, MD as Consulting Physician (General Surgery) Noreene Filbert, MD as Referring Physician (Radiation Oncology) Jeral Fruit, RN as Registered Nurse   CHIEF COMPLAINT: Recurrent stage IV ER/PR positive, 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  INTERVAL HISTORY: Patient returns to clinic today for further evaluation and continuation of treatment with [name], Faslodex, and Xgeva.  She has occasional left hip pain, but otherwise feels well and is asymptomatic.  She is tolerating her treatments without significant side effects.  She does not complain of any other pain today.  She continues to have a mild peripheral neuropathy, but no other neurologic complaints.  She denies any chest pain, shortness of breath, cough, or hemoptysis.  She denies any nausea, vomiting, constipation, or diarrhea.  She has no urinary complaints.  Patient offers no further specific complaints today.  REVIEW OF SYSTEMS:   Review of Systems  Constitutional: Negative for fever, malaise/fatigue and weight loss.  HENT: Negative for congestion.   Respiratory: Negative.  Negative for cough and shortness of breath.   Cardiovascular: Negative.  Negative for chest pain and leg swelling.  Gastrointestinal: Negative.  Negative for abdominal pain, constipation, diarrhea and nausea.  Genitourinary: Negative.  Negative for dysuria and urgency.  Musculoskeletal: Positive for joint pain. Negative for back pain.  Skin: Negative.  Negative for rash.   Neurological: Positive for tingling and sensory change. Negative for dizziness, focal weakness, weakness and headaches.  Psychiatric/Behavioral: Negative.  The patient is not nervous/anxious.     As per HPI. Otherwise, a complete review of systems is negative.  PAST MEDICAL HISTORY: Past Medical History:  Diagnosis Date  . Anxiety   . Breast cancer, left ([place]) 07/2017  . Depression   . Hypertension   . Hypothyroidism   . Personal history of chemotherapy 2019   LEFT mastectomy-chemo before  . Personal history of radiation therapy 03/2018   LEFT mastectomy  . Rapid heart rate   . Thyroid disease     PAST SURGICAL HISTORY: Past Surgical History:  Procedure Laterality Date  . AXILLARY LYMPH NODE BIOPSY Left 07/16/2017   METASTATIC MAMMARY CARCINOMA  . BREAST BIOPSY Left 07/16/2017   [place] bx of left breast mass and left breast LN.  INVASIVE MAMMARY CARCINOMA, NO SPECIAL TYPE.   Marland Kitchen BREAST EXCISIONAL BIOPSY Right 2001   benign  . BREAST LUMPECTOMY WITH SENTINEL LYMPH NODE BIOPSY Left 12/06/2017   Procedure: BREAST LUMPECTOMY WITH SENTINEL LYMPH NODE BX;  Surgeon: Robert Bellow, MD;  Location: [facility] ORS;  Service: General;  Laterality: Left;  . COLONOSCOPY    . MASTECTOMY Left 12/23/2017  . PORTACATH PLACEMENT Right 08/07/2017   Procedure: INSERTION PORT-A-CATH;  Surgeon: Robert Bellow, MD;  Location: [facility] OR[facility];  Service: General;  Laterality: Right;  . SIMPLE MASTECTOMY WITH AXILLARY SENTINEL NODE BIOPSY Left 12/23/2017   T2,N2 with 6/7 nodes positive. Whole breast radiation.  Surgeon: Robert Bellow, MD;  Location: [facility] ORS;  Service: General;  Laterality: Left;  . TONSILLECTOMY      FAMILY HISTORY: Family History  Problem Relation Age of Onset  . Stroke Mother   .  Thyroid disease Mother   . Renal Disease Mother   . Stroke Father   . Heart attack Father   . Sudden death Father 54       suicide  . Anuerysm Brother   . Breast cancer Neg Hx     ADVANCED  DIRECTIVES (Y/N):  N  HEALTH MAINTENANCE: Social History   Tobacco Use  . Smoking status: Former Smoker    Packs/day: 1.00    Years: 30.00    Pack years: 30.00    Types: Cigarettes    Quit date: 2019    Years since quitting: 3.3  . Smokeless tobacco: Never Used  Vaping Use  . Vaping Use: Never used  Substance Use Topics  . Alcohol use: Not Currently  . Drug use: Never     Colonoscopy:  PAP:  Bone density:  Lipid panel:  Allergies  Allergen Reactions  . Sulfa Antibiotics Diarrhea    Current Outpatient Medications  Medication Sig Dispense Refill  . acetaminophen (TYLENOL) 500 MG tablet Take 1,000 mg by mouth every 6 (six) hours as needed for moderate pain or headache.     . ALPRAZolam (XANAX) 0.5 MG tablet Take 1 tablet (0.5 mg total) by mouth 2 (two) times daily. 60 tablet 0  . aspirin EC 81 MG tablet Take 81 mg by mouth at bedtime.     . calcium carbonate (OS-CAL - DOSED IN MG OF ELEMENTAL CALCIUM) 1250 (500 Ca) MG tablet Take 1 tablet by mouth daily with breakfast.    . Cholecalciferol (VITAMIN D3) 125 MCG (5000 UT) CAPS Take 1 capsule by mouth 2 (two) times a day.    . docusate sodium (COLACE) 100 MG capsule Take 100 mg by mouth 3 (three) times daily as needed for mild constipation.    . hydrochlorothiazide (HYDRODIURIL) 12.5 MG tablet Take 1 tablet (12.5 mg total) by mouth daily. 90 tablet 3  . levothyroxine (SYNTHROID) 100 MCG tablet TAKE 1 TABLET BY MOUTH DAILY 90 tablet 3  . losartan (COZAAR) 100 MG tablet Take 1 tablet (100 mg total) by mouth daily. 90 tablet 3  . metoprolol tartrate (LOPRESSOR) 25 MG tablet Take 1 tablet (25 mg total) by mouth once for 1 dose. Take 2 hours prior to CTA. 1 tablet 0  . nystatin-lidocaine-prednisoLONE-diphenhydrAMINE-distilled water-alum & mag hydroxide-simeth Swish and spit 5-29m four times a day as needed 480 mL 3  . ondansetron (ZOFRAN) 8 MG tablet Take 1 tablet (8 mg total) by mouth every 8 (eight) hours as needed for nausea or  vomiting. 20 tablet 0  . palbociclib (IBRANCE) 125 MG tablet Take 1 tablet (125 mg total) by mouth daily. Take for 21 days, then hold for 7 days. Repeat every 28 days. 21 tablet 3  . rosuvastatin (CRESTOR) 5 MG tablet TAKE 1 TABLET BY MOUTH DAILY 30 tablet 6  . traZODone (DESYREL) 50 MG tablet TAKE 1 TABLET BY MOUTH DAILY 30 tablet 6   No current facility-administered medications for this visit.    OBJECTIVE: There were no vitals filed for this visit.   There is no height or weight on file to calculate BMI.    ECOG FS:0 - Asymptomatic  General: Well-developed, well-nourished, no acute distress. Eyes: Pink conjunctiva, anicteric sclera. HEENT: Normocephalic, moist mucous membranes. Lungs: No audible wheezing or coughing. Heart: Regular rate and rhythm. Abdomen: Soft, nontender, no obvious distention. Musculoskeletal: No edema, cyanosis, or clubbing. Neuro: Alert, answering all questions appropriately. Cranial nerves grossly intact. Skin: No rashes or petechiae noted. Psych:  Normal affect.   LAB RESULTS:  Lab Results  Component Value Date   NA 136 04/19/2020   K 3.8 04/19/2020   CL 100 04/19/2020   CO2 25 04/19/2020   GLUCOSE 120 (H) 04/19/2020   BUN 27 (H) 04/19/2020   CREATININE 1.76 (H) 04/19/2020   CALCIUM 9.9 04/19/2020   PROT 7.2 04/19/2020   ALBUMIN 4.1 04/19/2020   AST 22 04/19/2020   ALT 17 04/19/2020   ALKPHOS 66 04/19/2020   BILITOT 0.8 04/19/2020   GFRNONAA 31 (L) 04/19/2020   GFRAA 57 (L) 10/01/2019    Lab Results  Component Value Date   WBC 2.6 (L) 04/19/2020   NEUTROABS 1.7 04/19/2020   HGB 11.2 (L) 04/19/2020   HCT 32.1 (L) 04/19/2020   MCV 100.6 (H) 04/19/2020   PLT 193 04/19/2020     STUDIES: No results found. ONCOLOGY HISTORY: Patient initially received neoadjuvant chemotherapy with Adriamycin and Cytoxan followed by weekly Taxol. She only received 4 cycles of weekly Taxol prior to discontinuation of treatment on October 31, 2017 secondary to  persistent peripheral neuropathy.  She ultimately required mastectomy and final pathology noted 5 of 6 lymph nodes positive for disease.  Patient completed adjuvant XRT in mid April 2020.  Pain initiated letrozole in May 2020, this was discontinued secondary to side effects and patient was started on anastrozole.  Nuclear medicine bone scan on June 19, 2019 revealed metastatic disease and anastrozole was subsequently discontinued.  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  PLAN:    1. 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  MRI of the brain on August 24, 2019 reviewed independently with no obvious evidence of metastatic disease.  Patient's CA 27-29 initially increased to 141.9, but continues to slowly trend down and is now 46.9.  Despite mild neutropenia, will continue 125 mg Ibrance for 21 days with 7 days off.  Proceed with Faslodex and Xgeva today.  Return to clinic in 4 weeks for treatment and evaluation by clinical pharmacist and then in 8 weeks for further evaluation and continuation of treatment. 2.  Peripheral neuropathy: Chronic and unchanged. 3.  Pulmonary embolus: Patient was diagnosed with a small pulmonary embolus on January 10, 2018.  She is no longer on anticoagulation.  There was no evidence of recurrent PE on CT scan from October 27, 2019. 4.  Osteopenia: Patient's most recent bone mineral density on March 03, 2019 reported a T score of -1.8 which is only mildly decreased from 1 year prior when the reported T score was -1.6.  Continue calcium and vitamin D supplementation.  Xgeva as above. 5.  Left hip pain: Mild.  Continue symptomatic treatment.  Patient has had XRT to this area. 6.  Leukopenia: Chronic and unchanged.  Proceed with treatment  as planned. 7.  Anemia: Chronic and unchanged.  Patient's hemoglobin is 11.2 today. 8.  Renal insufficiency: Mild.  Patient's creatinine is 1.4 today, monitor.  Patient expressed understanding and was in agreement with this plan. She also understands that She can call clinic at any time with any questions, concerns, or complaints.   Cancer Staging Breast cancer, stage 2, left ([place]) Staging form: Breast, AJCC 8th Edition - Clinical stage from 07/30/2017: Stage IIA (cT2, cN1, cM0, G2, ER+, PR+, HER2-) - Signed by Lloyd Huger, MD on 07/30/2017  Histologic grading system: 3 grade system Laterality: Left   Lloyd Huger, MD   05/08/2020 10:14 AM

## 2020-05-12 ENCOUNTER — Other Ambulatory Visit (HOSPITAL_COMMUNITY): Payer: Self-pay

## 2020-05-12 ENCOUNTER — Inpatient Hospital Stay: Payer: Medicare Other

## 2020-05-12 ENCOUNTER — Inpatient Hospital Stay: Payer: Medicare Other | Admitting: Oncology

## 2020-05-12 DIAGNOSIS — C50912 Malignant neoplasm of unspecified site of left female breast: Secondary | ICD-10-CM

## 2020-05-14 NOTE — Progress Notes (Signed)
[place]  [phone]) [phone] [phone]) [phone]  ID: Alicia [name] OB: [birth year]  MR#: [id number]  HCW#:[id number]  Patient Care Team: [name], [name], Harrell Gave, [name] Rico Junker, RN as Registered Nurse Lloyd Huger, MD as Consulting Physician (Oncology) Bary Castilla Forest Gleason, MD as Consulting Physician (General Surgery) Bary Castilla Forest Gleason, MD as Consulting Physician (General Surgery) Noreene Filbert, MD as Referring Physician (Radiation Oncology) Jeral Fruit, RN as Registered Nurse   CHIEF COMPLAINT: Recurrent stage IV ER/PR positive, 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  INTERVAL HISTORY: Patient returns to clinic today for routine evaluation and continuation of treatment with [name], Faslodex, and Xgeva.  She has occasional fatigue, but this does not affect her day-to-day activity.  She otherwise feels well and is tolerating her treatments without significant side effects.  She does not complain of pain today.  She continues to have a mild peripheral neuropathy, but no other neurologic complaints.  She denies any chest pain, shortness of breath, cough, or hemoptysis.  She denies any nausea, vomiting, constipation, or diarrhea.  She has no urinary complaints.  Patient offers no further specific complaints today.  REVIEW OF SYSTEMS:   Review of Systems  Constitutional: Positive for malaise/fatigue. Negative for fever and weight loss.  HENT: Negative for congestion.   Respiratory: Negative.  Negative for cough and shortness of breath.   Cardiovascular: Negative.  Negative for chest pain and leg swelling.  Gastrointestinal: Negative.  Negative for abdominal pain, constipation, diarrhea and nausea.  Genitourinary: Negative.  Negative for dysuria and urgency.  Musculoskeletal: Negative.  Negative for back pain and joint pain.  Skin: Negative.   Negative for rash.  Neurological: Positive for tingling and sensory change. Negative for dizziness, focal weakness, weakness and headaches.  Psychiatric/Behavioral: Negative.  The patient is not nervous/anxious.     As per HPI. Otherwise, a complete review of systems is negative.  PAST MEDICAL HISTORY: Past Medical History:  Diagnosis Date  . Anxiety   . Breast cancer, left ([place]) 07/2017  . Depression   . Hypertension   . Hypothyroidism   . Personal history of chemotherapy 2019   LEFT mastectomy-chemo before  . Personal history of radiation therapy 03/2018   LEFT mastectomy  . Rapid heart rate   . Thyroid disease     PAST SURGICAL HISTORY: Past Surgical History:  Procedure Laterality Date  . AXILLARY LYMPH NODE BIOPSY Left 07/16/2017   METASTATIC MAMMARY CARCINOMA  . BREAST BIOPSY Left 07/16/2017   [place] bx of left breast mass and left breast LN.  INVASIVE MAMMARY CARCINOMA, NO SPECIAL TYPE.   Marland Kitchen BREAST EXCISIONAL BIOPSY Right 2001   benign  . BREAST LUMPECTOMY WITH SENTINEL LYMPH NODE BIOPSY Left 12/06/2017   Procedure: BREAST LUMPECTOMY WITH SENTINEL LYMPH NODE BX;  Surgeon: Robert Bellow, MD;  Location: [facility] ORS;  Service: General;  Laterality: Left;  . COLONOSCOPY    . MASTECTOMY Left 12/23/2017  . PORTACATH PLACEMENT Right 08/07/2017   Procedure: INSERTION PORT-A-CATH;  Surgeon: Robert Bellow, MD;  Location: [facility] ORS;  Service: General;  Laterality: Right;  . SIMPLE MASTECTOMY WITH AXILLARY SENTINEL NODE BIOPSY Left 12/23/2017   T2,N2 with 6/7 nodes positive. Whole breast radiation.  Surgeon: Robert Bellow, MD;  Location: [facility] ORS;  Service: General;  Laterality: Left;  . TONSILLECTOMY      FAMILY HISTORY: Family History  Problem Relation Age of Onset  . Stroke  Mother   . Thyroid disease Mother   . Renal Disease Mother   . Stroke Father   . Heart attack Father   . Sudden death Father 52       suicide  . Anuerysm Brother   . Breast cancer Neg Hx      ADVANCED DIRECTIVES (Y/N):  N  HEALTH MAINTENANCE: Social History   Tobacco Use  . Smoking status: Former Smoker    Packs/day: 1.00    Years: 30.00    Pack years: 30.00    Types: Cigarettes    Quit date: 2019    Years since quitting: 3.3  . Smokeless tobacco: Never Used  Vaping Use  . Vaping Use: Never used  Substance Use Topics  . Alcohol use: Not Currently  . Drug use: Never     Colonoscopy:  PAP:  Bone density:  Lipid panel:  Allergies  Allergen Reactions  . Sulfa Antibiotics Diarrhea    Current Outpatient Medications  Medication Sig Dispense Refill  . acetaminophen (TYLENOL) 500 MG tablet Take 1,000 mg by mouth every 6 (six) hours as needed for moderate pain or headache.     . ALPRAZolam (XANAX) 0.5 MG tablet Take 1 tablet (0.5 mg total) by mouth 2 (two) times daily. 60 tablet 0  . aspirin EC 81 MG tablet Take 81 mg by mouth at bedtime.     . calcium carbonate (OS-CAL - DOSED IN MG OF ELEMENTAL CALCIUM) 1250 (500 Ca) MG tablet Take 1 tablet by mouth daily with breakfast.    . Cholecalciferol (VITAMIN D3) 125 MCG (5000 UT) CAPS Take 1 capsule by mouth 2 (two) times a day.    . docusate sodium (COLACE) 100 MG capsule Take 100 mg by mouth 3 (three) times daily as needed for mild constipation.    . hydrochlorothiazide (HYDRODIURIL) 12.5 MG tablet Take 1 tablet (12.5 mg total) by mouth daily. 90 tablet 3  . levothyroxine (SYNTHROID) 100 MCG tablet TAKE 1 TABLET BY MOUTH DAILY 90 tablet 3  . losartan (COZAAR) 100 MG tablet Take 1 tablet (100 mg total) by mouth daily. 90 tablet 3  . metoprolol tartrate (LOPRESSOR) 25 MG tablet Take 1 tablet (25 mg total) by mouth once for 1 dose. Take 2 hours prior to CTA. 1 tablet 0  . nystatin-lidocaine-prednisoLONE-diphenhydrAMINE-distilled water-alum & mag hydroxide-simeth Swish and spit 5-19m four times a day as needed 480 mL 3  . ondansetron (ZOFRAN) 8 MG tablet Take 1 tablet (8 mg total) by mouth every 8 (eight) hours as needed  for nausea or vomiting. 20 tablet 0  . palbociclib (IBRANCE) 125 MG tablet Take 1 tablet (125 mg total) by mouth daily. Take for 21 days, then hold for 7 days. Repeat every 28 days. 21 tablet 3  . rosuvastatin (CRESTOR) 5 MG tablet TAKE 1 TABLET BY MOUTH DAILY 30 tablet 6  . traZODone (DESYREL) 50 MG tablet TAKE 1 TABLET BY MOUTH DAILY 30 tablet 6   No current facility-administered medications for this visit.   Facility-Administered Medications Ordered in Other Visits  Medication Dose Route Frequency Provider Last Rate Last Admin  . denosumab (XGEVA) injection 120 mg  120 mg Subcutaneous Once FLloyd Huger MD      . fulvestrant (FASLODEX) injection 500 mg  500 mg Intramuscular Once FLloyd Huger MD        OBJECTIVE: Vitals:   05/19/20 1331  BP: (!) 113/56  Pulse: 72  Resp: 20  Temp: 98.7 F (37.1 C)  SpO2:  98%     Body mass index is 35.64 kg/m.    ECOG FS:0 - Asymptomatic  General: Well-developed, well-nourished, no acute distress. Eyes: Pink conjunctiva, anicteric sclera. HEENT: Normocephalic, moist mucous membranes. Lungs: No audible wheezing or coughing. Heart: Regular rate and rhythm. Abdomen: Soft, nontender, no obvious distention. Musculoskeletal: No edema, cyanosis, or clubbing. Neuro: Alert, answering all questions appropriately. Cranial nerves grossly intact. Skin: No rashes or petechiae noted. Psych: Normal affect.  LAB RESULTS:  Lab Results  Component Value Date   NA 135 05/19/2020   K 3.7 05/19/2020   CL 101 05/19/2020   CO2 22 05/19/2020   GLUCOSE 100 (H) 05/19/2020   BUN 27 (H) 05/19/2020   CREATININE 1.51 (H) 05/19/2020   CALCIUM 9.1 05/19/2020   PROT 7.4 05/19/2020   ALBUMIN 4.2 05/19/2020   AST 22 05/19/2020   ALT 18 05/19/2020   ALKPHOS 69 05/19/2020   BILITOT 0.9 05/19/2020   GFRNONAA 37 (L) 05/19/2020   GFRAA 57 (L) 10/01/2019    Lab Results  Component Value Date   WBC 2.4 (L) 05/19/2020   NEUTROABS 1.5 (L) 05/19/2020    HGB 10.5 (L) 05/19/2020   HCT 29.9 (L) 05/19/2020   MCV 101.7 (H) 05/19/2020   PLT 186 05/19/2020     STUDIES: No results found. ONCOLOGY HISTORY: Patient initially received neoadjuvant chemotherapy with Adriamycin and Cytoxan followed by weekly Taxol. She only received 4 cycles of weekly Taxol prior to discontinuation of treatment on October 31, 2017 secondary to persistent peripheral neuropathy.  She ultimately required mastectomy and final pathology noted 5 of 6 lymph nodes positive for disease.  Patient completed adjuvant XRT in mid April 2020.  Pain initiated letrozole in May 2020, this was discontinued secondary to side effects and patient was started on anastrozole.  Nuclear medicine bone scan on June 19, 2019 revealed metastatic disease and anastrozole was subsequently discontinued.  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  PLAN:    1. 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  MRI of the brain on August 24, 2019 reviewed independently with no obvious evidence of metastatic disease.  Patient's CA 27-29 initially increased to 141.9, but continues to slowly trend down and is now 46.9.  Today's result is pending.  Despite persistent, mild neutropenia, will continue 125 mg Ibrance for 21 days with 7 days off.  Proceed with Faslodex and Xgeva today.  Patient initiated monthly Xgeva on August 06, 2019.  Return to clinic in 4 weeks for laboratory work, treatment, and evaluation by clinical pharmacist.  Patient will then return to clinic in 8 weeks for further evaluation and continuation of treatment. 2.  Peripheral neuropathy: Mild.  Chronic and unchanged.   3.  Pulmonary embolus: Patient was diagnosed with a small pulmonary embolus on January 10, 2018.  She  is no longer on anticoagulation.  There was no evidence of recurrent PE on CT scan from October 27, 2019. 4.  Osteopenia: Patient's most recent bone mineral density on March 03, 2019 reported a T score of -1.8 which is only mildly decreased from 1 year prior when the reported T score was -1.6.  Continue calcium and vitamin D supplementation.  Xgeva as above. 5.  Left hip pain: Patient does not complain of this today.  Continue symptomatic treatment.  Patient has had  XRT to this area. 6.  Leukopenia: Chronic and unchanged.  Proceed with treatment as planned. 7.  Anemia: Hemoglobin is trended down slightly to 10.5, monitor. 8.  Renal insufficiency: Chronic and unchanged.  Patient's creatinine is 1.51 today.  Patient expressed understanding and was in agreement with this plan. She also understands that She can call clinic at any time with any questions, concerns, or complaints.   Cancer Staging Breast cancer, stage 2, left ([place]) Staging form: Breast, AJCC 8th Edition - Clinical stage from 07/30/2017: Stage IIA (cT2, cN1, cM0, G2, ER+, PR+, HER2-) - Signed by Lloyd Huger, MD on 07/30/2017 Histologic grading system: 3 grade system Laterality: Left   Lloyd Huger, MD   05/19/2020 2:10 PM

## 2020-05-17 ENCOUNTER — Ambulatory Visit: Payer: Medicare Other | Admitting: Oncology

## 2020-05-17 ENCOUNTER — Other Ambulatory Visit: Payer: Medicare Other

## 2020-05-17 ENCOUNTER — Ambulatory Visit: Payer: Medicare Other

## 2020-05-19 ENCOUNTER — Inpatient Hospital Stay: Payer: Medicare Other | Attending: Oncology | Admitting: Oncology

## 2020-05-19 ENCOUNTER — Encounter: Payer: Self-pay | Admitting: Oncology

## 2020-05-19 ENCOUNTER — Other Ambulatory Visit: Payer: Self-pay

## 2020-05-19 ENCOUNTER — Inpatient Hospital Stay: Payer: Medicare Other

## 2020-05-19 ENCOUNTER — Other Ambulatory Visit: Payer: Self-pay | Admitting: Pharmacist

## 2020-05-19 VITALS — BP 113/56 | HR 72 | Temp 98.7°F | Resp 20 | Wt 220.8 lb

## 2020-05-19 DIAGNOSIS — C50912 Malignant neoplasm of unspecified site of left female breast: Secondary | ICD-10-CM

## 2020-05-19 DIAGNOSIS — Z5111 Encounter for antineoplastic chemotherapy: Secondary | ICD-10-CM | POA: Diagnosis not present

## 2020-05-19 DIAGNOSIS — Z79899 Other long term (current) drug therapy: Secondary | ICD-10-CM | POA: Diagnosis not present

## 2020-05-19 DIAGNOSIS — C7951 Secondary malignant neoplasm of bone: Secondary | ICD-10-CM | POA: Diagnosis not present

## 2020-05-19 DIAGNOSIS — C50919 Malignant neoplasm of unspecified site of unspecified female breast: Secondary | ICD-10-CM

## 2020-05-19 LAB — COMPREHENSIVE METABOLIC PANEL
ALT: 18 U/L (ref 0–44)
AST: 22 U/L (ref 15–41)
Albumin: 4.2 g/dL (ref 3.5–5.0)
Alkaline Phosphatase: 69 U/L (ref 38–126)
Anion gap: 12 (ref 5–15)
BUN: 27 mg/dL — ABNORMAL HIGH (ref 8–23)
CO2: 22 mmol/L (ref 22–32)
Calcium: 9.1 mg/dL (ref 8.9–10.3)
Chloride: 101 mmol/L (ref 98–111)
Creatinine, Ser: 1.51 mg/dL — ABNORMAL HIGH (ref 0.44–1.00)
GFR, Estimated: 37 mL/min — ABNORMAL LOW (ref >60)
Glucose, Bld: 100 mg/dL — ABNORMAL HIGH (ref 70–99)
Potassium: 3.7 mmol/L (ref 3.5–5.1)
Sodium: 135 mmol/L (ref 135–145)
Total Bilirubin: 0.9 mg/dL (ref 0.3–1.2)
Total Protein: 7.4 g/dL (ref 6.5–8.1)

## 2020-05-19 LAB — CBC WITH DIFFERENTIAL/PLATELET
Abs Immature Granulocytes: 0.01 10*3/uL (ref 0.00–0.07)
Basophils Absolute: 0.1 10*3/uL (ref 0.0–0.1)
Basophils Relative: 2 %
Eosinophils Absolute: 0 10*3/uL (ref 0.0–0.5)
Eosinophils Relative: 1 %
HCT: 29.9 % — ABNORMAL LOW (ref 36.0–46.0)
Hemoglobin: 10.5 g/dL — ABNORMAL LOW (ref 12.0–15.0)
Immature Granulocytes: 0 %
Lymphocytes Relative: 18 %
Lymphs Abs: 0.4 10*3/uL — ABNORMAL LOW (ref 0.7–4.0)
MCH: 35.7 pg — ABNORMAL HIGH (ref 26.0–34.0)
MCHC: 35.1 g/dL (ref 30.0–36.0)
MCV: 101.7 fL — ABNORMAL HIGH (ref 80.0–100.0)
Monocytes Absolute: 0.4 10*3/uL (ref 0.1–1.0)
Monocytes Relative: 18 %
Neutro Abs: 1.5 10*3/uL — ABNORMAL LOW (ref 1.7–7.7)
Neutrophils Relative %: 61 %
Platelets: 186 10*3/uL (ref 150–400)
RBC: 2.94 MIL/uL — ABNORMAL LOW (ref 3.87–5.11)
RDW: 14.2 % (ref 11.5–15.5)
WBC: 2.4 10*3/uL — ABNORMAL LOW (ref 4.0–10.5)
nRBC: 0 % (ref 0.0–0.2)

## 2020-05-19 MED ORDER — FULVESTRANT 250 MG/5ML IM SOLN
500.0000 mg | Freq: Once | INTRAMUSCULAR | Status: AC
Start: 2020-05-19 — End: 2020-05-19
  Administered 2020-05-19: 500 mg via INTRAMUSCULAR
  Filled 2020-05-19: qty 10

## 2020-05-19 MED ORDER — DENOSUMAB 120 MG/1.7ML ~~LOC~~ SOLN
120.0000 mg | Freq: Once | SUBCUTANEOUS | Status: AC
  Administered 2020-05-19: 120 mg via SUBCUTANEOUS
  Filled 2020-05-19: qty 1.7

## 2020-05-19 NOTE — Progress Notes (Signed)
Patient states she has been dizzy. This has been going on for about a week or two. Patient states she is usually fine after drink water.

## 2020-05-20 ENCOUNTER — Other Ambulatory Visit (HOSPITAL_COMMUNITY): Payer: Self-pay

## 2020-06-03 ENCOUNTER — Other Ambulatory Visit (HOSPITAL_COMMUNITY): Payer: Self-pay

## 2020-06-04 ENCOUNTER — Other Ambulatory Visit (HOSPITAL_COMMUNITY): Payer: Self-pay

## 2020-06-06 ENCOUNTER — Other Ambulatory Visit (HOSPITAL_COMMUNITY): Payer: Self-pay

## 2020-06-07 ENCOUNTER — Ambulatory Visit: Payer: Medicare Other | Admitting: Internal Medicine

## 2020-06-09 ENCOUNTER — Encounter: Payer: Self-pay | Admitting: Radiation Oncology

## 2020-06-09 ENCOUNTER — Ambulatory Visit
Admission: RE | Admit: 2020-06-09 | Discharge: 2020-06-09 | Disposition: A | Discharge status: Home / Self Care | Payer: Medicare Other | Source: Ambulatory Visit | Attending: Radiation Oncology | Admitting: Radiation Oncology

## 2020-06-09 VITALS — BP 120/72 | HR 76 | Temp 97.1°F | Wt 221.0 lb

## 2020-06-09 DIAGNOSIS — Z170 Estrogen receptor positive status [ER+]: Secondary | ICD-10-CM | POA: Diagnosis not present

## 2020-06-09 DIAGNOSIS — C50919 Malignant neoplasm of unspecified site of unspecified female breast: Secondary | ICD-10-CM

## 2020-06-09 DIAGNOSIS — C7951 Secondary malignant neoplasm of bone: Secondary | ICD-10-CM | POA: Diagnosis not present

## 2020-06-09 DIAGNOSIS — C50412 Malignant neoplasm of upper-outer quadrant of left female breast: Secondary | ICD-10-CM | POA: Diagnosis not present

## 2020-06-09 DIAGNOSIS — Z08 Encounter for follow-up examination after completed treatment for malignant neoplasm: Secondary | ICD-10-CM | POA: Diagnosis not present

## 2020-06-09 NOTE — Progress Notes (Signed)
Radiation Oncology Follow up Note  Name: Alicia [name]   Date:   06/09/2020 MRN:  [id number] DOB: 1950-01-26    This 70 y.o. female presents to the clinic today for close to 1 year follow-up status post palliative radiation therapy to her left hip humerus and scapula with patient with known stage IV invasive mammary carcinoma.  REFERRING PROVIDER: [name], MD  HPI: Patient is a 70 year old female with known stage IV invasive mammary carcinoma previously treated to her left hip humerus and supraclavicular region for palliation of pain in all those areas are responded extremely well.  The patient is currently on.  Ibrance Faslodex and Xgeva which she is tolerating fairly well.  She does occasionally have some twinging in her left hip although no significant pain or difficulty ambulating.  COMPLICATIONS OF TREATMENT: none  FOLLOW UP COMPLIANCE: keeps appointments   PHYSICAL EXAM:  BP 120/72   Pulse 76   Temp (!) 97.1 F (36.2 C) (Tympanic)   Wt 221 lb (100.2 kg)   BMI 35.67 kg/m  She is status post left modified radical mastectomy chest wall is clear.  Right breast shows no evidence of mass or nodularity.  Range of motion of her lower extremities does not elicit pain.  No palpable disease in the supraclavicular region is noted.  Well-developed well-nourished patient in NAD. HEENT reveals PERLA, EOMI, discs not visualized.  Oral cavity is clear. No oral mucosal lesions are identified. Neck is clear without evidence of cervical or supraclavicular adenopathy. Lungs are clear to A&P. Cardiac examination is essentially unremarkable with regular rate and rhythm without murmur rub or thrill. Abdomen is benign with no organomegaly or masses noted. Motor sensory and DTR levels are equal and symmetric in the upper and lower extremities. Cranial nerves II through XII are grossly intact. Proprioception is intact. No peripheral adenopathy or edema is identified. No motor or sensory levels are  noted. Crude visual fields are within normal range.  RADIOLOGY RESULTS: No current films to review  PLAN: Present time patient continues on treatment with medical oncology and she seems stable with no evidence of new bone lesions.  I have asked to see her back in 6 months I would be happy to reevaluate the patient in time should further palliative treatment be indicated.  She is done well with her palliative treatment in the past.  Patient knows to call with any concerns.  I would like to take this opportunity to thank you for allowing me to participate in the care of your patient.Noreene Filbert, MD

## 2020-06-14 ENCOUNTER — Other Ambulatory Visit: Payer: Self-pay

## 2020-06-14 ENCOUNTER — Encounter: Payer: Self-pay | Admitting: Internal Medicine

## 2020-06-14 ENCOUNTER — Ambulatory Visit (INDEPENDENT_AMBULATORY_CARE_PROVIDER_SITE_OTHER): Payer: Medicare Other | Admitting: Internal Medicine

## 2020-06-14 VITALS — BP 111/63 | HR 67 | Ht 66.0 in | Wt 222.0 lb

## 2020-06-14 DIAGNOSIS — I471 Supraventricular tachycardia: Secondary | ICD-10-CM | POA: Diagnosis not present

## 2020-06-14 DIAGNOSIS — R400 Somnolence: Secondary | ICD-10-CM | POA: Diagnosis not present

## 2020-06-14 DIAGNOSIS — I10 Essential (primary) hypertension: Secondary | ICD-10-CM

## 2020-06-14 DIAGNOSIS — C50919 Malignant neoplasm of unspecified site of unspecified female breast: Secondary | ICD-10-CM | POA: Diagnosis not present

## 2020-06-14 DIAGNOSIS — E669 Obesity, unspecified: Secondary | ICD-10-CM

## 2020-06-14 DIAGNOSIS — F419 Anxiety disorder, unspecified: Secondary | ICD-10-CM

## 2020-06-14 DIAGNOSIS — E7800 Pure hypercholesterolemia, unspecified: Secondary | ICD-10-CM

## 2020-06-14 DIAGNOSIS — C7951 Secondary malignant neoplasm of bone: Secondary | ICD-10-CM

## 2020-06-14 DIAGNOSIS — K219 Gastro-esophageal reflux disease without esophagitis: Secondary | ICD-10-CM

## 2020-06-14 MED ORDER — ALPRAZOLAM 0.5 MG PO TABS: 0.5000 mg | ORAL_TABLET | Freq: Two times a day (BID) | ORAL | 0 refills | Status: DC

## 2020-06-14 NOTE — Assessment & Plan Note (Signed)
better 

## 2020-06-14 NOTE — Assessment & Plan Note (Signed)
Patient is doing well she is under care of oncology

## 2020-06-14 NOTE — Assessment & Plan Note (Signed)
There is no recurrence of tachycardia.

## 2020-06-14 NOTE — Assessment & Plan Note (Signed)
-   I encouraged the patient to lose weight.  - I educated them on making healthy dietary choices including eating more fruits and vegetables and less fried foods. - I encouraged the patient to exercise more, and educated on the benefits of exercise including weight loss, diabetes.   

## 2020-06-14 NOTE — Assessment & Plan Note (Signed)
-   The patient's GERD is stable on medication.  - Instructed the patient to avoid eating spicy and acidic foods, as well as foods high in fat. - Instructed the patient to avoid eating large meals or meals 2-3 hours prior to sleeping. 

## 2020-06-14 NOTE — Assessment & Plan Note (Signed)
Advised the patient to follow low-cholesterol diet

## 2020-06-14 NOTE — Patient Instructions (Signed)
Preventive Care 71 Years and Older, Female Preventive care refers to lifestyle choices and visits with your health care provider that can promote health and wellness. This includes:  A yearly physical exam. This is also called an annual wellness visit.  Regular dental and eye exams.  Immunizations.  Screening for certain conditions.  Healthy lifestyle choices, such as: ? Eating a healthy diet. ? Getting regular exercise. ? Not using drugs or products that contain nicotine and tobacco. ? Limiting alcohol use. What can I expect for my preventive care visit? Physical exam Your health care provider will check your:  Height and weight. These may be used to calculate your BMI (body mass index). BMI is a measurement that tells if you are at a healthy weight.  Heart rate and blood pressure.  Body temperature.  Skin for abnormal spots. Counseling Your health care provider may ask you questions about your:  Past medical problems.  Family's medical history.  Alcohol, tobacco, and drug use.  Emotional well-being.  Home life and relationship well-being.  Sexual activity.  Diet, exercise, and sleep habits.  History of falls.  Memory and ability to understand (cognition).  Work and work Statistician.  Pregnancy and menstrual history.  Access to firearms. What immunizations do I need? Vaccines are usually given at various ages, according to a schedule. Your health care provider will recommend vaccines for you based on your age, medical history, and lifestyle or other factors, such as travel or where you work.   What tests do I need? Blood tests  Lipid and cholesterol levels. These may be checked every 5 years, or more often depending on your overall health.  Hepatitis C test.  Hepatitis B test. Screening  Lung cancer screening. You may have this screening every year starting at age 71 if you have a 30-pack-year history of smoking and currently smoke or have quit within  the past 15 years.  Colorectal cancer screening. ? All adults should have this screening starting at age 71 and continuing until age 58. ? Your health care provider may recommend screening at age 2 if you are at increased risk. ? You will have tests every 1-10 years, depending on your results and the type of screening test.  Diabetes screening. ? This is done by checking your blood sugar (glucose) after you have not eaten for a while (fasting). ? You may have this done every 1-3 years.  Mammogram. ? This may be done every 1-2 years. ? Talk with your health care provider about how often you should have regular mammograms.  Abdominal aortic aneurysm (AAA) screening. You may need this if you are a current or former smoker.  BRCA-related cancer screening. This may be done if you have a family history of breast, ovarian, tubal, or peritoneal cancers. Other tests  STD (sexually transmitted disease) testing, if you are at risk.  Bone density scan. This is done to screen for osteoporosis. You may have this done starting at age 71. Talk with your health care provider about your test results, treatment options, and if necessary, the need for more tests. Follow these instructions at home: Eating and drinking  Eat a diet that includes fresh fruits and vegetables, whole grains, lean protein, and low-fat dairy products. Limit your intake of foods with high amounts of sugar, saturated fats, and salt.  Take vitamin and mineral supplements as recommended by your health care provider.  Do not drink alcohol if your health care provider tells you not to drink.  If you drink alcohol: ? Limit how much you have to 0-1 drink a day. ? Be aware of how much alcohol is in your drink. In the [place] one drink equals one 12 oz bottle of beer (355 mL), one 5 oz glass of wine (148 mL), or one 1 oz glass of hard liquor (44 mL).   Lifestyle  Take daily care of your teeth and gums. Brush your teeth every morning  and night with fluoride toothpaste. Floss one time each day.  Stay active. Exercise for at least 30 minutes 5 or more days each week.  Do not use any products that contain nicotine or tobacco, such as cigarettes, e-cigarettes, and chewing tobacco. If you need help quitting, ask your health care provider.  Do not use drugs.  If you are sexually active, practice safe sex. Use a condom or other form of protection in order to prevent STIs (sexually transmitted infections).  Talk with your health care provider about taking a low-dose aspirin or statin.  Find healthy ways to cope with stress, such as: ? Meditation, yoga, or listening to music. ? Journaling. ? Talking to a trusted person. ? Spending time with friends and family. Safety  Always wear your seat belt while driving or riding in a vehicle.  Do not drive: ? If you have been drinking alcohol. Do not ride with someone who has been drinking. ? When you are tired or distracted. ? While texting.  Wear a helmet and other protective equipment during sports activities.  If you have firearms in your house, make sure you follow all gun safety procedures. What's next?  Visit your health care provider once a year for an annual wellness visit.  Ask your health care provider how often you should have your eyes and teeth checked.  Stay up to date on all vaccines. This information is not intended to replace advice given to you by your health care provider. Make sure you discuss any questions you have with your health care provider. Document Revised: 12/23/2019 Document Reviewed: 12/26/2017 Elsevier Patient Education  2021 [employer]  

## 2020-06-14 NOTE — Assessment & Plan Note (Signed)
-   Patient experiencing high levels of anxiety.  - Encouraged patient to engage in relaxing activities like yoga, meditation, journaling, going for a walk, or participating in a hobby.  - Encouraged patient to reach out to trusted friends or family members about recent struggles 

## 2020-06-14 NOTE — Progress Notes (Signed)
Established Patient Office Visit  Subjective:  Patient ID: Alicia [name], female    DOB: 1950/08/02  Age: 70 y.o. MRN: [id number]  CC:  Chief Complaint  Patient presents with  . Anxiety    HPI  Alicia [name] presents forcheck up  Past Medical History:  Diagnosis Date  . Anxiety   . Breast cancer, left ([place]) 07/2017  . Depression   . Hypertension   . Hypothyroidism   . Personal history of chemotherapy 2019   LEFT mastectomy-chemo before  . Personal history of radiation therapy 03/2018   LEFT mastectomy  . Rapid heart rate   . Thyroid disease     Past Surgical History:  Procedure Laterality Date  . AXILLARY LYMPH NODE BIOPSY Left 07/16/2017   METASTATIC MAMMARY CARCINOMA  . BREAST BIOPSY Left 07/16/2017   [place] bx of left breast mass and left breast LN.  INVASIVE MAMMARY CARCINOMA, NO SPECIAL TYPE.   Marland Kitchen BREAST EXCISIONAL BIOPSY Right 2001   benign  . BREAST LUMPECTOMY WITH SENTINEL LYMPH NODE BIOPSY Left 12/06/2017   Procedure: BREAST LUMPECTOMY WITH SENTINEL LYMPH NODE BX;  Surgeon: Robert Bellow, MD;  Location: [facility] ORS;  Service: General;  Laterality: Left;  . COLONOSCOPY    . MASTECTOMY Left 12/23/2017  . PORTACATH PLACEMENT Right 08/07/2017   Procedure: INSERTION PORT-A-CATH;  Surgeon: Robert Bellow, MD;  Location: [facility] ORS;  Service: General;  Laterality: Right;  . SIMPLE MASTECTOMY WITH AXILLARY SENTINEL NODE BIOPSY Left 12/23/2017   T2,N2 with 6/7 nodes positive. Whole breast radiation.  Surgeon: Robert Bellow, MD;  Location: [facility] ORS;  Service: General;  Laterality: Left;  . TONSILLECTOMY      Family History  Problem Relation Age of Onset  . Stroke Mother   . Thyroid disease Mother   . Renal Disease Mother   . Stroke Father   . Heart attack Father   . Sudden death Father 55       suicide  . Anuerysm Brother   . Breast cancer Neg Hx     Social History   Socioeconomic History  . Marital status: Widowed    Spouse name: Not on  file  . Number of children: Not on file  . Years of education: Not on file  . Highest education level: Not on file  Occupational History  . Not on file  Tobacco Use  . Smoking status: Former Smoker    Packs/day: 1.00    Years: 30.00    Pack years: 30.00    Types: Cigarettes    Quit date: 2019    Years since quitting: 3.4  . Smokeless tobacco: Never Used  Vaping Use  . Vaping Use: Never used  Substance and Sexual Activity  . Alcohol use: Not Currently  . Drug use: Never  . Sexual activity: Not Currently  Other Topics Concern  . Not on file  Social History Narrative  . Not on file   Social Determinants of Health   Financial Resource Strain: Not on file  Food Insecurity: Not on file  Transportation Needs: Not on file  Physical Activity: Not on file  Stress: Not on file  Social Connections: Not on file  Intimate Partner Violence: Not on file     Current Outpatient Medications:  .  acetaminophen (TYLENOL) 500 MG tablet, Take 1,000 mg by mouth every 6 (six) hours as needed for moderate pain or headache. , Disp: , Rfl:  .  aspirin EC 81 MG tablet, Take 81  mg by mouth at bedtime. , Disp: , Rfl:  .  calcium carbonate (OS-CAL - DOSED IN MG OF ELEMENTAL CALCIUM) 1250 (500 Ca) MG tablet, Take 1 tablet by mouth daily with breakfast., Disp: , Rfl:  .  Cholecalciferol (VITAMIN D3) 125 MCG (5000 UT) CAPS, Take 1 capsule by mouth 2 (two) times a day., Disp: , Rfl:  .  docusate sodium (COLACE) 100 MG capsule, Take 100 mg by mouth 3 (three) times daily as needed for mild constipation., Disp: , Rfl:  .  hydrochlorothiazide (HYDRODIURIL) 12.5 MG tablet, Take 1 tablet (12.5 mg total) by mouth daily., Disp: 90 tablet, Rfl: 3 .  levothyroxine (SYNTHROID) 100 MCG tablet, TAKE 1 TABLET BY MOUTH DAILY, Disp: 90 tablet, Rfl: 3 .  losartan (COZAAR) 100 MG tablet, Take 1 tablet (100 mg total) by mouth daily., Disp: 90 tablet, Rfl: 3 .  nystatin-lidocaine-prednisoLONE-diphenhydrAMINE-distilled  water-alum & mag hydroxide-simeth, Swish and spit 5-28ml four times a day as needed, Disp: 480 mL, Rfl: 3 .  ondansetron (ZOFRAN) 8 MG tablet, Take 1 tablet (8 mg total) by mouth every 8 (eight) hours as needed for nausea or vomiting., Disp: 20 tablet, Rfl: 0 .  palbociclib (IBRANCE) 125 MG tablet, Take 1 tablet (125 mg total) by mouth daily. Take for 21 days, then hold for 7 days. Repeat every 28 days., Disp: 21 tablet, Rfl: 3 .  rosuvastatin (CRESTOR) 5 MG tablet, TAKE 1 TABLET BY MOUTH DAILY, Disp: 30 tablet, Rfl: 6 .  traZODone (DESYREL) 50 MG tablet, TAKE 1 TABLET BY MOUTH DAILY, Disp: 30 tablet, Rfl: 6 .  ALPRAZolam (XANAX) 0.5 MG tablet, Take 1 tablet (0.5 mg total) by mouth 2 (two) times daily., Disp: 60 tablet, Rfl: 0 .  metoprolol tartrate (LOPRESSOR) 25 MG tablet, Take 1 tablet (25 mg total) by mouth once for 1 dose. Take 2 hours prior to CTA., Disp: 1 tablet, Rfl: 0   Allergies  Allergen Reactions  . Sulfa Antibiotics Diarrhea    ROS Review of Systems  Constitutional: Negative.   HENT: Negative.   Eyes: Negative.   Respiratory: Negative.   Cardiovascular: Negative.   Gastrointestinal: Negative.   Endocrine: Negative.   Genitourinary: Negative.   Musculoskeletal: Negative.   Skin: Negative.   Allergic/Immunologic: Negative.   Neurological: Negative.   Hematological: Negative.   Psychiatric/Behavioral: Negative.   All other systems reviewed and are negative.     Objective:    Physical Exam Vitals reviewed.  Constitutional:      Appearance: Normal appearance.  HENT:     Mouth/Throat:     Mouth: Mucous membranes are moist.  Eyes:     Pupils: Pupils are equal, round, and reactive to light.  Neck:     Vascular: No carotid bruit.  Cardiovascular:     Rate and Rhythm: Normal rate and regular rhythm.     Pulses: Normal pulses.     Heart sounds: Normal heart sounds.  Pulmonary:     Effort: Pulmonary effort is normal.     Breath sounds: Normal breath sounds.   Chest:       Comments: Lt mastectomy Abdominal:     General: Bowel sounds are normal.     Palpations: Abdomen is soft. There is no hepatomegaly, splenomegaly or mass.     Tenderness: There is no abdominal tenderness.     Hernia: No hernia is present.  Musculoskeletal:        General: No tenderness.     Cervical back: Neck supple.  Right lower leg: No edema.     Left lower leg: No edema.  Skin:    Findings: No rash.  Neurological:     Mental Status: She is alert and oriented to person, place, and time.     Motor: No weakness.  Psychiatric:        Mood and Affect: Mood and affect normal.        Behavior: Behavior normal.     BP 111/63   Pulse 67   Ht 5\' 6"  (1.676 m)   Wt 222 lb (100.7 kg)   BMI 35.83 kg/m  Wt Readings from Last 3 Encounters:  06/14/20 222 lb (100.7 kg)  06/09/20 221 lb (100.2 kg)  05/19/20 220 lb 12.8 oz (100.2 kg)     Health Maintenance Due  Topic Date Due  . COVID-19 Vaccine (1) Never done  . Hepatitis C Screening  Never done  . TETANUS/TDAP  Never done  . COLONOSCOPY (Pts 45-74yrs Insurance coverage will need to be confirmed)  Never done  . Zoster Vaccines- Shingrix (1 of 2) Never done  . PNA vac Low Risk Adult (1 of 2 - PCV13) Never done    There are no preventive care reminders to display for this patient.  Lab Results  Component Value Date   TSH 0.925 10/27/2019   Lab Results  Component Value Date   WBC 2.4 (L) 05/19/2020   HGB 10.5 (L) 05/19/2020   HCT 29.9 (L) 05/19/2020   MCV 101.7 (H) 05/19/2020   PLT 186 05/19/2020   Lab Results  Component Value Date   NA 135 05/19/2020   K 3.7 05/19/2020   CO2 22 05/19/2020   GLUCOSE 100 (H) 05/19/2020   BUN 27 (H) 05/19/2020   CREATININE 1.51 (H) 05/19/2020   BILITOT 0.9 05/19/2020   ALKPHOS 69 05/19/2020   AST 22 05/19/2020   ALT 18 05/19/2020   PROT 7.4 05/19/2020   ALBUMIN 4.2 05/19/2020   CALCIUM 9.1 05/19/2020   ANIONGAP 12 05/19/2020   Lab Results  Component Value  Date   CHOL 202 (H) 12/15/2019   Lab Results  Component Value Date   HDL 104 12/15/2019   Lab Results  Component Value Date   LDLCALC 87 12/15/2019   Lab Results  Component Value Date   TRIG 60 12/15/2019   Lab Results  Component Value Date   CHOLHDL 1.9 12/15/2019   Lab Results  Component Value Date   HGBA1C 5.9 (H) 10/04/2017      Assessment & Plan:   Problem List Items Addressed This Visit      Cardiovascular and Mediastinum   Primary hypertension - Primary    Patient blood pressure is normal patient denies any chest pain or shortness of breath there is no history of palpitation or paroxysmal nocturnal dyspnea   patient was advised to follow low-salt low-cholesterol diet    ideally I want to keep systolic blood pressure below 130 mmHg, patient was asked to check blood pressure one times a week and give me a report on that.  Patient will be follow-up in 3 months  or earlier as needed, patient will call me back for any change in the cardiovascular symptoms Patient was advised to buy a book from local bookstore concerning blood pressure and read several chapters  every day.  This will be supplemented by some of the material we will give him from the office.  Patient should also utilize other resources like YouTube and Internet to learn more  about the blood pressure and the diet.      PSVT (paroxysmal supraventricular tachycardia) (HCC)    There is no recurrence of tachycardia.        Digestive   Gastroesophageal reflux disease without esophagitis    - The patient's GERD is stable on medication.  - Instructed the patient to avoid eating spicy and acidic foods, as well as foods high in fat. - Instructed the patient to avoid eating large meals or meals 2-3 hours prior to sleeping.        Musculoskeletal and Integument   Carcinoma of breast metastatic to bone [facility])    Patient is doing well she is under care of oncology      Relevant Medications   ALPRAZolam  (XANAX) 0.5 MG tablet     Other   Anxiety    - Patient experiencing high levels of anxiety.  - Encouraged patient to engage in relaxing activities like yoga, meditation, journaling, going for a walk, or participating in a hobby.  - Encouraged patient to reach out to trusted friends or family members about recent struggles      Relevant Medications   ALPRAZolam (XANAX) 0.5 MG tablet   Obesity (BMI 35.0-39.9 without comorbidity)    - I encouraged the patient to lose weight.  - I educated them on making healthy dietary choices including eating more fruits and vegetables and less fried foods. - I encouraged the patient to exercise more, and educated on the benefits of exercise including weight loss, diabetes        Daytime somnolence    better      Pure hypercholesterolemia    Advised the patient to follow low-cholesterol diet         Meds ordered this encounter  Medications  . ALPRAZolam (XANAX) 0.5 MG tablet    Sig: Take 1 tablet (0.5 mg total) by mouth 2 (two) times daily.    Dispense:  60 tablet    Refill:  0    Follow-up: No follow-ups on file.    [name], MD

## 2020-06-14 NOTE — Assessment & Plan Note (Signed)

## 2020-06-16 ENCOUNTER — Inpatient Hospital Stay: Payer: Medicare Other

## 2020-06-16 ENCOUNTER — Inpatient Hospital Stay: Payer: Medicare Other | Admitting: Pharmacist

## 2020-06-16 ENCOUNTER — Inpatient Hospital Stay: Payer: Medicare Other | Attending: Oncology

## 2020-06-16 VITALS — Ht 66.0 in | Wt 223.3 lb

## 2020-06-16 DIAGNOSIS — D72819 Decreased white blood cell count, unspecified: Secondary | ICD-10-CM | POA: Diagnosis not present

## 2020-06-16 DIAGNOSIS — C7951 Secondary malignant neoplasm of bone: Secondary | ICD-10-CM | POA: Diagnosis not present

## 2020-06-16 DIAGNOSIS — Z5111 Encounter for antineoplastic chemotherapy: Secondary | ICD-10-CM | POA: Insufficient documentation

## 2020-06-16 DIAGNOSIS — Z86711 Personal history of pulmonary embolism: Secondary | ICD-10-CM | POA: Diagnosis not present

## 2020-06-16 DIAGNOSIS — C50912 Malignant neoplasm of unspecified site of left female breast: Secondary | ICD-10-CM

## 2020-06-16 DIAGNOSIS — N289 Disorder of kidney and ureter, unspecified: Secondary | ICD-10-CM | POA: Diagnosis not present

## 2020-06-16 DIAGNOSIS — Z87891 Personal history of nicotine dependence: Secondary | ICD-10-CM | POA: Insufficient documentation

## 2020-06-16 DIAGNOSIS — G629 Polyneuropathy, unspecified: Secondary | ICD-10-CM | POA: Insufficient documentation

## 2020-06-16 DIAGNOSIS — M8580 Other specified disorders of bone density and structure, unspecified site: Secondary | ICD-10-CM | POA: Insufficient documentation

## 2020-06-16 DIAGNOSIS — C50919 Malignant neoplasm of unspecified site of unspecified female breast: Secondary | ICD-10-CM

## 2020-06-16 DIAGNOSIS — D649 Anemia, unspecified: Secondary | ICD-10-CM | POA: Diagnosis not present

## 2020-06-16 DIAGNOSIS — Z79899 Other long term (current) drug therapy: Secondary | ICD-10-CM | POA: Insufficient documentation

## 2020-06-16 LAB — CBC WITH DIFFERENTIAL/PLATELET
Abs Immature Granulocytes: 0.01 10*3/uL (ref 0.00–0.07)
Basophils Absolute: 0.1 10*3/uL (ref 0.0–0.1)
Basophils Relative: 2 %
Eosinophils Absolute: 0 10*3/uL (ref 0.0–0.5)
Eosinophils Relative: 1 %
HCT: 29.3 % — ABNORMAL LOW (ref 36.0–46.0)
Hemoglobin: 10.3 g/dL — ABNORMAL LOW (ref 12.0–15.0)
Immature Granulocytes: 0 %
Lymphocytes Relative: 14 %
Lymphs Abs: 0.3 10*3/uL — ABNORMAL LOW (ref 0.7–4.0)
MCH: 36.4 pg — ABNORMAL HIGH (ref 26.0–34.0)
MCHC: 35.2 g/dL (ref 30.0–36.0)
MCV: 103.5 fL — ABNORMAL HIGH (ref 80.0–100.0)
Monocytes Absolute: 0.3 10*3/uL (ref 0.1–1.0)
Monocytes Relative: 13 %
Neutro Abs: 1.7 10*3/uL (ref 1.7–7.7)
Neutrophils Relative %: 70 %
Platelets: 172 10*3/uL (ref 150–400)
RBC: 2.83 MIL/uL — ABNORMAL LOW (ref 3.87–5.11)
RDW: 14.9 % (ref 11.5–15.5)
WBC: 2.4 10*3/uL — ABNORMAL LOW (ref 4.0–10.5)
nRBC: 0 % (ref 0.0–0.2)

## 2020-06-16 LAB — COMPREHENSIVE METABOLIC PANEL
ALT: 17 U/L (ref 0–44)
AST: 24 U/L (ref 15–41)
Albumin: 3.9 g/dL (ref 3.5–5.0)
Alkaline Phosphatase: 69 U/L (ref 38–126)
Anion gap: 11 (ref 5–15)
BUN: 28 mg/dL — ABNORMAL HIGH (ref 8–23)
CO2: 23 mmol/L (ref 22–32)
Calcium: 9.3 mg/dL (ref 8.9–10.3)
Chloride: 100 mmol/L (ref 98–111)
Creatinine, Ser: 1.41 mg/dL — ABNORMAL HIGH (ref 0.44–1.00)
GFR, Estimated: 40 mL/min — ABNORMAL LOW (ref >60)
Glucose, Bld: 144 mg/dL — ABNORMAL HIGH (ref 70–99)
Potassium: 3.9 mmol/L (ref 3.5–5.1)
Sodium: 134 mmol/L — ABNORMAL LOW (ref 135–145)
Total Bilirubin: 0.7 mg/dL (ref 0.3–1.2)
Total Protein: 7 g/dL (ref 6.5–8.1)

## 2020-06-16 MED ORDER — DENOSUMAB 120 MG/1.7ML ~~LOC~~ SOLN
120.0000 mg | Freq: Once | SUBCUTANEOUS | Status: AC
  Administered 2020-06-16: 120 mg via SUBCUTANEOUS
  Filled 2020-06-16: qty 1.7

## 2020-06-16 MED ORDER — FULVESTRANT 250 MG/5ML IM SOLN
500.0000 mg | Freq: Once | INTRAMUSCULAR | Status: AC
  Administered 2020-06-16: 500 mg via INTRAMUSCULAR
  Filled 2020-06-16: qty 10

## 2020-06-16 NOTE — Progress Notes (Signed)
[place]  Telephone:([phone]) [phone] Fax:([phone]) [phone]  Patient Care Team: [name], [name], Harrell Gave, [name] Rico Junker, RN as Registered Nurse Grayland Ormond Kathlene November, MD as Consulting Physician (Oncology) Bary Castilla Forest Gleason, MD as Consulting Physician (General Surgery) Bary Castilla Forest Gleason, MD as Consulting Physician (General Surgery) Noreene Filbert, MD as Referring Physician (Radiation Oncology) Jeral Fruit, RN as Registered Nurse   Name of the patient: Alicia [name]  [id number]  01-Mar-1950   Date of visit: 06/16/20  HPI: Patient is a 70 y.o. female with recurrent stage IV ER/PR positive, HER2 negative breast cancer. Patient started on Ibrance on 07/09/19.  Reason for Consult: Oral chemotherapy follow-up for Ibrance (palbociclib) therapy.   PAST MEDICAL HISTORY: Past Medical History:  Diagnosis Date  . Anxiety   . Breast cancer, left ([place]) 07/2017  . Depression   . Hypertension   . Hypothyroidism   . Personal history of chemotherapy 2019   LEFT mastectomy-chemo before  . Personal history of radiation therapy 03/2018   LEFT mastectomy  . Rapid heart rate   . Thyroid disease     PAST SURGICAL HISTORY:  Past Surgical History:  Procedure Laterality Date  . AXILLARY LYMPH NODE BIOPSY Left 07/16/2017   METASTATIC MAMMARY CARCINOMA  . BREAST BIOPSY Left 07/16/2017   [place] bx of left breast mass and left breast LN.  INVASIVE MAMMARY CARCINOMA, NO SPECIAL TYPE.   Marland Kitchen BREAST EXCISIONAL BIOPSY Right 2001   benign  . BREAST LUMPECTOMY WITH SENTINEL LYMPH NODE BIOPSY Left 12/06/2017   Procedure: BREAST LUMPECTOMY WITH SENTINEL LYMPH NODE BX;  Surgeon: Robert Bellow, MD;  Location: [facility] ORS;  Service: General;  Laterality: Left;  . COLONOSCOPY    . MASTECTOMY Left 12/23/2017  . PORTACATH PLACEMENT Right 08/07/2017   Procedure: INSERTION  PORT-A-CATH;  Surgeon: Robert Bellow, MD;  Location: [facility] ORS;  Service: General;  Laterality: Right;  . SIMPLE MASTECTOMY WITH AXILLARY SENTINEL NODE BIOPSY Left 12/23/2017   T2,N2 with 6/7 nodes positive. Whole breast radiation.  Surgeon: Robert Bellow, MD;  Location: [facility] ORS;  Service: General;  Laterality: Left;  . TONSILLECTOMY      HEMATOLOGY/ONCOLOGY HISTORY:  Oncology History Overview Note  Patient is a 70 year old female who recently self palpated a mass on her left breast.  Subsequent imaging and biopsy revealed the above-stated breast cancer.  Case was also discussed extensively at case conference.  Given the size and the stage of patient's malignancy, she will benefit from neoadjuvant chemotherapy using Adriamycin, Cytoxan, and Taxol.  Patient will also require Neulasta support.  Will get CT scan of the chest, abdomen, and pelvis to assess for any metastatic disease.  Patient will also require port placement and MUGA prior to initiating treatment  CT abdomen/pelvis/chest did not reveal any suspicious lesions concerning for metastatic disease. (08/01/17)  Port-A-Cath placed on 08/07/2017.  Cycle 1 day 1 of AC was given on 08/08/17.     Breast cancer, stage 2, left ([place])  07/24/2017 Initial Diagnosis   Breast cancer, stage 2, left ([place])   07/30/2017 Cancer Staging   Staging form: Breast, AJCC 8th Edition - Clinical stage from 07/30/2017: Stage IIA (cT2, cN1, cM0, G2, ER+, PR+, HER2-) - Signed by Lloyd Huger, MD on 07/30/2017   08/08/2017 - 10/31/2017 Chemotherapy   The patient had DOXOrubicin (ADRIAMYCIN) chemo injection 130 mg, 60 mg/m2 = 130 mg, Intravenous,  Once, 4  of 4 cycles Administration: 130 mg (08/08/2017), 130 mg (08/22/2017), 130 mg (09/05/2017), 130 mg (09/19/2017) palonosetron (ALOXI) injection 0.25 mg, 0.25 mg, Intravenous,  Once, 4 of 4 cycles Administration: 0.25 mg (08/08/2017), 0.25 mg (08/22/2017), 0.25 mg (09/05/2017), 0.25 mg (09/19/2017) pegfilgrastim-cbqv  (UDENYCA) injection 6 mg, 6 mg, Subcutaneous, Once, 4 of 4 cycles Administration: 6 mg (08/09/2017), 6 mg (08/23/2017), 6 mg (09/06/2017), 6 mg (09/20/2017) cyclophosphamide (CYTOXAN) 1,300 mg in sodium chloride 0.9 % 250 mL chemo infusion, 600 mg/m2 = 1,300 mg, Intravenous,  Once, 4 of 4 cycles Administration: 1,300 mg (08/08/2017), 1,300 mg (08/22/2017), 1,300 mg (09/05/2017), 1,300 mg (09/19/2017) PACLitaxel (TAXOL) 174 mg in sodium chloride 0.9 % 250 mL chemo infusion (</= 99m/m2), 80 mg/m2 = 174 mg, Intravenous,  Once, 4 of 12 cycles Dose modification: 72 mg/m2 (original dose 80 mg/m2, Cycle 6, Reason: Dose not tolerated) Administration: 174 mg (10/03/2017), 156 mg (10/17/2017), 156 mg (10/24/2017), 156 mg (10/31/2017) fosaprepitant (EMEND) 150 mg, dexamethasone (DECADRON) 12 mg in sodium chloride 0.9 % 145 mL IVPB, , Intravenous,  Once, 4 of 4 cycles Administration:  (08/08/2017),  (08/22/2017),  (09/05/2017),  (09/19/2017)  for chemotherapy treatment.      ALLERGIES:  is allergic to sulfa antibiotics.  MEDICATIONS:  Current Outpatient Medications  Medication Sig Dispense Refill  . acetaminophen (TYLENOL) 500 MG tablet Take 1,000 mg by mouth every 6 (six) hours as needed for moderate pain or headache.     . ALPRAZolam (XANAX) 0.5 MG tablet Take 1 tablet (0.5 mg total) by mouth 2 (two) times daily. 60 tablet 0  . aspirin EC 81 MG tablet Take 81 mg by mouth at bedtime.     . calcium carbonate (OS-CAL - DOSED IN MG OF ELEMENTAL CALCIUM) 1250 (500 Ca) MG tablet Take 1 tablet by mouth daily with breakfast.    . Cholecalciferol (VITAMIN D3) 125 MCG (5000 UT) CAPS Take 1 capsule by mouth 2 (two) times a day.    . docusate sodium (COLACE) 100 MG capsule Take 100 mg by mouth 3 (three) times daily as needed for mild constipation.    . hydrochlorothiazide (HYDRODIURIL) 12.5 MG tablet Take 1 tablet (12.5 mg total) by mouth daily. 90 tablet 3  . levothyroxine (SYNTHROID) 100 MCG tablet TAKE 1 TABLET BY MOUTH DAILY 90  tablet 3  . losartan (COZAAR) 100 MG tablet Take 1 tablet (100 mg total) by mouth daily. 90 tablet 3  . metoprolol tartrate (LOPRESSOR) 25 MG tablet Take 1 tablet (25 mg total) by mouth once for 1 dose. Take 2 hours prior to CTA. 1 tablet 0  . nystatin-lidocaine-prednisoLONE-diphenhydrAMINE-distilled water-alum & mag hydroxide-simeth Swish and spit 5-149mfour times a day as needed 480 mL 3  . ondansetron (ZOFRAN) 8 MG tablet Take 1 tablet (8 mg total) by mouth every 8 (eight) hours as needed for nausea or vomiting. 20 tablet 0  . palbociclib (IBRANCE) 125 MG tablet Take 1 tablet (125 mg total) by mouth daily. Take for 21 days, then hold for 7 days. Repeat every 28 days. 21 tablet 3  . rosuvastatin (CRESTOR) 5 MG tablet TAKE 1 TABLET BY MOUTH DAILY 30 tablet 6  . traZODone (DESYREL) 50 MG tablet TAKE 1 TABLET BY MOUTH DAILY 30 tablet 6   No current facility-administered medications for this visit.    VITAL SIGNS: There were no vitals taken for this visit. There were no vitals filed for this visit.  Estimated body mass index is 35.83 kg/m as calculated from the following:  Height as of 06/14/20: _0  (1.676 m).   Weight as of 06/14/20: 100.7 kg (222 lb).  LABS: CBC:    Component Value Date/Time   WBC 2.4 (L) 06/16/2020 1318   HGB 10.3 (L) 06/16/2020 1318   HGB 14.2 02/11/2011 1349   HCT 29.3 (L) 06/16/2020 1318   HCT 40.9 02/11/2011 1349   PLT 172 06/16/2020 1318   PLT 151 02/11/2011 1349   MCV 103.5 (H) 06/16/2020 1318   MCV 89 02/11/2011 1349   NEUTROABS 1.7 06/16/2020 1318   LYMPHSABS 0.3 (L) 06/16/2020 1318   MONOABS 0.3 06/16/2020 1318   EOSABS 0.0 06/16/2020 1318   BASOSABS 0.1 06/16/2020 1318   Comprehensive Metabolic Panel:    Component Value Date/Time   NA 135 05/19/2020 1313   NA 142 02/11/2011 1349   K 3.7 05/19/2020 1313   K 3.8 02/11/2011 1349   CL 101 05/19/2020 1313   CL 107 02/11/2011 1349   CO2 22 05/19/2020 1313   CO2 24 02/11/2011 1349   BUN 27 (H)  05/19/2020 1313   BUN 14 02/11/2011 1349   CREATININE 1.51 (H) 05/19/2020 1313   CREATININE 0.65 02/11/2011 1349   GLUCOSE 100 (H) 05/19/2020 1313   GLUCOSE 98 02/11/2011 1349   CALCIUM 9.1 05/19/2020 1313   CALCIUM 9.0 02/11/2011 1349   AST 22 05/19/2020 1313   AST 27 02/11/2011 1349   ALT 18 05/19/2020 1313   ALT 36 02/11/2011 1349   ALKPHOS 69 05/19/2020 1313   ALKPHOS 122 02/11/2011 1349   BILITOT 0.9 05/19/2020 1313   BILITOT 0.5 02/11/2011 1349   PROT 7.4 05/19/2020 1313   PROT 7.0 02/11/2011 1349   ALBUMIN 4.2 05/19/2020 1313   ALBUMIN 4.1 02/11/2011 1349    RADIOGRAPHIC STUDIES: No results found.   Assessment and Plan-  Reviewed CBC and CMP, ANC and ALT/AST/T.bili within normal limits. CA 27.29 increased since last checked on 03/17/20. Will continue Ibrance 161m and follow CA 27.29 trend.     Oral Chemotherapy Side Effect/Intolerance:  - Previously reported issues with constipation, fatigue, and mouth sores have improved  - No other reported side effects  Oral Chemotherapy Adherence: No reported missed doses  Medication Access Issues: no issues, fills her Ibrance at [facility]   Patient expressed understanding and was in agreement with this plan. She also understands that She can call clinic at any time with any questions, concerns, or complaints.   Thank you for allowing me to participate in the care of this very pleasant patient.   Time Total: 15 mins  Visit consisted of counseling and education on dealing with issues of symptom management in the setting of serious and potentially life-threatening illness.Greater than 50%  of this time was spent counseling and coordinating care related to the above assessment and plan.  Signed by: ADarl Pikes PharmD, BCPS, BSalley Slaughter CPP Hematology/Oncology Clinical Pharmacist Practitioner [phone] 06/16/2020 1:31 PM

## 2020-06-17 LAB — CANCER ANTIGEN 27.29: CA 27.29: 51.2 U/mL — ABNORMAL HIGH (ref 0.0–38.6)

## 2020-06-23 ENCOUNTER — Other Ambulatory Visit: Payer: Self-pay | Admitting: Internal Medicine

## 2020-06-30 ENCOUNTER — Other Ambulatory Visit (HOSPITAL_COMMUNITY): Payer: Self-pay

## 2020-07-04 ENCOUNTER — Other Ambulatory Visit (HOSPITAL_COMMUNITY): Payer: Self-pay

## 2020-07-09 NOTE — Progress Notes (Signed)
[place]  [phone] [phone]) [phone]  ID: Alicia [name] OB: 13-Feb-1950  MR#: [id number]  QIO#:[id number]  Patient Care Team: [name], [name], Harrell Gave, [name] Rico Junker, RN as Registered Nurse Lloyd Huger, MD as Consulting Physician (Oncology) Bary Castilla Forest Gleason, MD as Consulting Physician (General Surgery) Bary Castilla Forest Gleason, MD as Consulting Physician (General Surgery) Noreene Filbert, MD as Referring Physician (Radiation Oncology) Jeral Fruit, RN as Registered Nurse   CHIEF COMPLAINT: Recurrent stage IV ER/PR positive, 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  INTERVAL HISTORY: Patient returns to clinic today for routine evaluation and continuation of treatment with [name], Faslodex, and Xgeva.  She continues to tolerate her treatments well without significant side effects.  She does not complain of weakness or fatigue today. She does not complain of pain today.  She continues to have a mild peripheral neuropathy, but no other neurologic complaints.  She denies any chest pain, shortness of breath, cough, or hemoptysis.  She denies any nausea, vomiting, constipation, or diarrhea.  She has no urinary complaints.  Patient offers no specific complaints today.  REVIEW OF SYSTEMS:   Review of Systems  Constitutional: Negative.  Negative for fever, malaise/fatigue and weight loss.  HENT:  Negative for congestion.   Respiratory: Negative.  Negative for cough and shortness of breath.   Cardiovascular: Negative.  Negative for chest pain and leg swelling.  Gastrointestinal: Negative.  Negative for abdominal pain, constipation, diarrhea and nausea.  Genitourinary: Negative.  Negative for dysuria and urgency.  Musculoskeletal: Negative.  Negative for back pain and joint pain.  Skin: Negative.  Negative for rash.  Neurological:  Positive for  tingling and sensory change. Negative for dizziness, focal weakness, weakness and headaches.  Psychiatric/Behavioral: Negative.  The patient is not nervous/anxious.    As per HPI. Otherwise, a complete review of systems is negative.  PAST MEDICAL HISTORY: Past Medical History:  Diagnosis Date   Anxiety    Breast cancer, left ([place]) 07/2017   Depression    Hypertension    Hypothyroidism    Personal history of chemotherapy 2019   LEFT mastectomy-chemo before   Personal history of radiation therapy 03/2018   LEFT mastectomy   Rapid heart rate    Thyroid disease     PAST SURGICAL HISTORY: Past Surgical History:  Procedure Laterality Date   AXILLARY LYMPH NODE BIOPSY Left 07/16/2017   METASTATIC MAMMARY CARCINOMA   BREAST BIOPSY Left 07/16/2017   [place] bx of left breast mass and left breast LN.  INVASIVE MAMMARY CARCINOMA, NO SPECIAL TYPE.    BREAST EXCISIONAL BIOPSY Right 2001   benign   BREAST LUMPECTOMY WITH SENTINEL LYMPH NODE BIOPSY Left 12/06/2017   Procedure: BREAST LUMPECTOMY WITH SENTINEL LYMPH NODE BX;  Surgeon: Robert Bellow, MD;  Location: [facility] ORS;  Service: General;  Laterality: Left;   COLONOSCOPY     MASTECTOMY Left 12/23/2017   PORTACATH PLACEMENT Right 08/07/2017   Procedure: INSERTION PORT-A-CATH;  Surgeon: Robert Bellow, MD;  Location: [facility] OR[facility];  Service: General;  Laterality: Right;   SIMPLE MASTECTOMY WITH AXILLARY SENTINEL NODE BIOPSY Left 12/23/2017   T2,N2 with 6/7 nodes positive. Whole breast radiation.  Surgeon: Robert Bellow, MD;  Location: [facility] ORS;  Service: General;  Laterality: Left;   TONSILLECTOMY      FAMILY HISTORY: Family History  Problem Relation Age of Onset   Stroke Mother    Thyroid disease  Mother    Renal Disease Mother    Stroke Father    Heart attack Father    Sudden death Father 74       suicide   Anuerysm Brother    Breast cancer Neg Hx     ADVANCED DIRECTIVES (Y/N):  N  HEALTH MAINTENANCE: Social History    Tobacco Use   Smoking status: Former    Packs/day: 1.00    Years: 30.00    Pack years: 30.00    Types: Cigarettes    Quit date: 2019    Years since quitting: 3.4   Smokeless tobacco: Never  Vaping Use   Vaping Use: Never used  Substance Use Topics   Alcohol use: Not Currently   Drug use: Never     Colonoscopy:  PAP:  Bone density:  Lipid panel:  Allergies  Allergen Reactions   Sulfa Antibiotics Diarrhea    Current Outpatient Medications  Medication Sig Dispense Refill   acetaminophen (TYLENOL) 500 MG tablet Take 1,000 mg by mouth every 6 (six) hours as needed for moderate pain or headache.      ALPRAZolam (XANAX) 0.5 MG tablet Take 1 tablet (0.5 mg total) by mouth 2 (two) times daily. 60 tablet 0   aspirin EC 81 MG tablet Take 81 mg by mouth at bedtime.      calcium carbonate (OS-CAL - DOSED IN MG OF ELEMENTAL CALCIUM) 1250 (500 Ca) MG tablet Take 1 tablet by mouth daily with breakfast.     Cholecalciferol (VITAMIN D3) 125 MCG (5000 UT) CAPS Take 1 capsule by mouth 2 (two) times a day.     docusate sodium (COLACE) 100 MG capsule Take 100 mg by mouth 3 (three) times daily as needed for mild constipation.     hydrochlorothiazide (HYDRODIURIL) 12.5 MG tablet Take 1 tablet (12.5 mg total) by mouth daily. 90 tablet 3   levothyroxine (SYNTHROID) 100 MCG tablet TAKE 1 TABLET BY MOUTH DAILY 90 tablet 3   losartan (COZAAR) 100 MG tablet Take 1 tablet (100 mg total) by mouth daily. 90 tablet 3   nystatin-lidocaine-prednisoLONE-diphenhydrAMINE-distilled water-alum & mag hydroxide-simeth Swish and spit 5-80m four times a day as needed 480 mL 3   ondansetron (ZOFRAN) 8 MG tablet Take 1 tablet (8 mg total) by mouth every 8 (eight) hours as needed for nausea or vomiting. 20 tablet 0   palbociclib (IBRANCE) 125 MG tablet Take 1 tablet (125 mg total) by mouth daily. Take for 21 days, then hold for 7 days. Repeat every 28 days. 21 tablet 3   rosuvastatin (CRESTOR) 5 MG tablet TAKE 1 TABLET  BY MOUTH DAILY 30 tablet 6   traZODone (DESYREL) 50 MG tablet TAKE 1 TABLET BY MOUTH DAILY 30 tablet 6   metoprolol tartrate (LOPRESSOR) 25 MG tablet Take 1 tablet (25 mg total) by mouth once for 1 dose. Take 2 hours prior to CTA. 1 tablet 0   No current facility-administered medications for this visit.    OBJECTIVE: Vitals:   07/14/20 1426 07/14/20 1431  BP:  (!) 116/54  Pulse:  94  Resp: 20   Temp: (!) 97.3 F (36.3 C)      Body mass index is 36.09 kg/m.    ECOG FS:0 - Asymptomatic  General: Well-developed, well-nourished, no acute distress. Eyes: Pink conjunctiva, anicteric sclera. HEENT: Normocephalic, moist mucous membranes. Lungs: No audible wheezing or coughing. Heart: Regular rate and rhythm. Abdomen: Soft, nontender, no obvious distention. Musculoskeletal: No edema, cyanosis, or clubbing. Neuro: Alert, answering all  questions appropriately. Cranial nerves grossly intact. Skin: No rashes or petechiae noted. Psych: Normal affect.  LAB RESULTS:  Lab Results  Component Value Date   NA 133 (L) 07/14/2020   K 4.1 07/14/2020   CL 99 07/14/2020   CO2 24 07/14/2020   GLUCOSE 130 (H) 07/14/2020   BUN 24 (H) 07/14/2020   CREATININE 1.62 (H) 07/14/2020   CALCIUM 9.6 07/14/2020   PROT 7.7 07/14/2020   ALBUMIN 4.3 07/14/2020   AST 27 07/14/2020   ALT 18 07/14/2020   ALKPHOS 77 07/14/2020   BILITOT 1.0 07/14/2020   GFRNONAA 34 (L) 07/14/2020   GFRAA 57 (L) 10/01/2019    Lab Results  Component Value Date   WBC 2.9 (L) 07/14/2020   NEUTROABS 2.0 07/14/2020   HGB 11.2 (L) 07/14/2020   HCT 33.0 (L) 07/14/2020   MCV 104.4 (H) 07/14/2020   PLT 230 07/14/2020     STUDIES: No results found. ONCOLOGY HISTORY: Patient initially received neoadjuvant chemotherapy with Adriamycin and Cytoxan followed by weekly Taxol. She only received 4 cycles of weekly Taxol prior to discontinuation of treatment on October 31, 2017 secondary to persistent peripheral neuropathy.  She  ultimately required mastectomy and final pathology noted 5 of 6 lymph nodes positive for disease.  Patient completed adjuvant XRT in mid April 2020.  Pain initiated letrozole in May 2020, this was discontinued secondary to side effects and patient was started on anastrozole.  Nuclear medicine bone scan on June 19, 2019 revealed metastatic disease and anastrozole was subsequently discontinued.  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  PLAN:    1. 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  MRI of the brain on August 24, 2019 reviewed independently with no obvious evidence of metastatic disease.  Patient's CA 27-29 initially increased to 141.9, but appears to have plateaued since November 2021 between 46.9 and 53.3.  Today's result is pending. Despite persistent, mild neutropenia, will continue 125 mg Ibrance for 21 days with 7 days off.  Proceed with Faslodex and Xgeva today.  Patient initiated monthly Xgeva on August 06, 2019.  Will now transition Xgeva to every 3 months.  Return to clinic in 1 and 2 months for lab and Faslodex only.  Patient with then return to clinic in 3 months for further evaluation and continuation of Faslodex and Xgeva.  Repeat mammogram in August 2023.   2.  Peripheral neuropathy: Mild.  Chronic and unchanged.   3.  Pulmonary embolus: Patient was diagnosed with a small pulmonary embolus on January 10, 2018.  She is no longer on anticoagulation.  There was no evidence of recurrent PE on CT scan from October 27, 2019. 4.  Osteopenia: Patient's most recent bone mineral density on March 03, 2019 reported a T score of -1.8 which is only mildly decreased from 1 year prior when the reported T score was -1.6.  Continue calcium and vitamin D  supplementation.  Xgeva as above. 5.  Left hip pain: Patient does not complain of this today.  Continue symptomatic treatment.  Patient has had XRT to this area. 6.  Leukopenia: Chronic and unchanged.  Patient's white blood cell count is 2.9 today.  Proceed with treatment as above. 7.  Anemia: Mildly improved.  Patient's hemoglobin is 11.2. 8.  Renal insufficiency: Chronic and unchanged.  Patient's  creatinine is 1.62 today.  Patient expressed understanding and was in agreement with this plan. She also understands that She can call clinic at any time with any questions, concerns, or complaints.   Cancer Staging Breast cancer, stage 2, left ([place]) Staging form: Breast, AJCC 8th Edition - Clinical stage from 07/30/2017: Stage IIA (cT2, cN1, cM0, G2, ER+, PR+, HER2-) - Signed by Lloyd Huger, MD on 07/30/2017 Histologic grading system: 3 grade system Laterality: Left   Lloyd Huger, MD   07/15/2020 4:02 PM

## 2020-07-14 ENCOUNTER — Other Ambulatory Visit: Payer: Self-pay

## 2020-07-14 ENCOUNTER — Inpatient Hospital Stay: Payer: Medicare Other

## 2020-07-14 ENCOUNTER — Encounter: Payer: Self-pay | Admitting: Oncology

## 2020-07-14 ENCOUNTER — Inpatient Hospital Stay (HOSPITAL_BASED_OUTPATIENT_CLINIC_OR_DEPARTMENT_OTHER): Payer: Medicare Other | Admitting: Oncology

## 2020-07-14 VITALS — BP 116/54 | HR 94 | Temp 97.3°F | Resp 20 | Wt 223.6 lb

## 2020-07-14 DIAGNOSIS — C7951 Secondary malignant neoplasm of bone: Secondary | ICD-10-CM

## 2020-07-14 DIAGNOSIS — Z1231 Encounter for screening mammogram for malignant neoplasm of breast: Secondary | ICD-10-CM | POA: Diagnosis not present

## 2020-07-14 DIAGNOSIS — C50912 Malignant neoplasm of unspecified site of left female breast: Secondary | ICD-10-CM

## 2020-07-14 DIAGNOSIS — C50919 Malignant neoplasm of unspecified site of unspecified female breast: Secondary | ICD-10-CM

## 2020-07-14 DIAGNOSIS — Z5111 Encounter for antineoplastic chemotherapy: Secondary | ICD-10-CM | POA: Diagnosis not present

## 2020-07-14 DIAGNOSIS — G629 Polyneuropathy, unspecified: Secondary | ICD-10-CM | POA: Diagnosis not present

## 2020-07-14 DIAGNOSIS — Z87891 Personal history of nicotine dependence: Secondary | ICD-10-CM | POA: Diagnosis not present

## 2020-07-14 DIAGNOSIS — Z79899 Other long term (current) drug therapy: Secondary | ICD-10-CM | POA: Diagnosis not present

## 2020-07-14 LAB — CBC WITH DIFFERENTIAL/PLATELET
Abs Immature Granulocytes: 0.01 10*3/uL (ref 0.00–0.07)
Basophils Absolute: 0.1 10*3/uL (ref 0.0–0.1)
Basophils Relative: 2 %
Eosinophils Absolute: 0 10*3/uL (ref 0.0–0.5)
Eosinophils Relative: 1 %
HCT: 33 % — ABNORMAL LOW (ref 36.0–46.0)
Hemoglobin: 11.2 g/dL — ABNORMAL LOW (ref 12.0–15.0)
Immature Granulocytes: 0 %
Lymphocytes Relative: 15 %
Lymphs Abs: 0.4 10*3/uL — ABNORMAL LOW (ref 0.7–4.0)
MCH: 35.4 pg — ABNORMAL HIGH (ref 26.0–34.0)
MCHC: 33.9 g/dL (ref 30.0–36.0)
MCV: 104.4 fL — ABNORMAL HIGH (ref 80.0–100.0)
Monocytes Absolute: 0.4 10*3/uL (ref 0.1–1.0)
Monocytes Relative: 13 %
Neutro Abs: 2 10*3/uL (ref 1.7–7.7)
Neutrophils Relative %: 69 %
Platelets: 230 10*3/uL (ref 150–400)
RBC: 3.16 MIL/uL — ABNORMAL LOW (ref 3.87–5.11)
RDW: 14.5 % (ref 11.5–15.5)
WBC: 2.9 10*3/uL — ABNORMAL LOW (ref 4.0–10.5)
nRBC: 0 % (ref 0.0–0.2)

## 2020-07-14 LAB — COMPREHENSIVE METABOLIC PANEL
ALT: 18 U/L (ref 0–44)
AST: 27 U/L (ref 15–41)
Albumin: 4.3 g/dL (ref 3.5–5.0)
Alkaline Phosphatase: 77 U/L (ref 38–126)
Anion gap: 10 (ref 5–15)
BUN: 24 mg/dL — ABNORMAL HIGH (ref 8–23)
CO2: 24 mmol/L (ref 22–32)
Calcium: 9.6 mg/dL (ref 8.9–10.3)
Chloride: 99 mmol/L (ref 98–111)
Creatinine, Ser: 1.62 mg/dL — ABNORMAL HIGH (ref 0.44–1.00)
GFR, Estimated: 34 mL/min — ABNORMAL LOW (ref >60)
Glucose, Bld: 130 mg/dL — ABNORMAL HIGH (ref 70–99)
Potassium: 4.1 mmol/L (ref 3.5–5.1)
Sodium: 133 mmol/L — ABNORMAL LOW (ref 135–145)
Total Bilirubin: 1 mg/dL (ref 0.3–1.2)
Total Protein: 7.7 g/dL (ref 6.5–8.1)

## 2020-07-14 MED ORDER — FULVESTRANT 250 MG/5ML IM SOLN
500.0000 mg | Freq: Once | INTRAMUSCULAR | Status: AC
  Administered 2020-07-14: 500 mg via INTRAMUSCULAR
  Filled 2020-07-14: qty 10

## 2020-07-14 MED ORDER — DENOSUMAB 120 MG/1.7ML ~~LOC~~ SOLN
120.0000 mg | Freq: Once | SUBCUTANEOUS | Status: AC
  Administered 2020-07-14: 120 mg via SUBCUTANEOUS
  Filled 2020-07-14: qty 1.7

## 2020-07-14 NOTE — Progress Notes (Signed)
Patient here today for follow up, treatment consideration regarding breast cancer. Patient reports knots to injection area with last visit.

## 2020-07-15 ENCOUNTER — Encounter: Payer: Self-pay | Admitting: Oncology

## 2020-07-15 LAB — CANCER ANTIGEN 27.29: CA 27.29: 55.3 U/mL — ABNORMAL HIGH (ref 0.0–38.6)

## 2020-07-27 ENCOUNTER — Other Ambulatory Visit (HOSPITAL_COMMUNITY): Payer: Self-pay

## 2020-08-01 ENCOUNTER — Other Ambulatory Visit (HOSPITAL_COMMUNITY): Payer: Self-pay

## 2020-08-02 ENCOUNTER — Other Ambulatory Visit (HOSPITAL_COMMUNITY): Payer: Self-pay

## 2020-08-03 DIAGNOSIS — Z20822 Contact with and (suspected) exposure to covid-19: Secondary | ICD-10-CM | POA: Diagnosis not present

## 2020-08-09 ENCOUNTER — Encounter: Payer: Self-pay | Admitting: Internal Medicine

## 2020-08-09 ENCOUNTER — Ambulatory Visit (INDEPENDENT_AMBULATORY_CARE_PROVIDER_SITE_OTHER): Payer: Medicare Other | Admitting: Internal Medicine

## 2020-08-09 ENCOUNTER — Other Ambulatory Visit: Payer: Self-pay

## 2020-08-09 VITALS — BP 108/56 | HR 78 | Ht 66.0 in | Wt 224.0 lb

## 2020-08-09 DIAGNOSIS — I10 Essential (primary) hypertension: Secondary | ICD-10-CM | POA: Diagnosis not present

## 2020-08-09 DIAGNOSIS — F419 Anxiety disorder, unspecified: Secondary | ICD-10-CM

## 2020-08-09 DIAGNOSIS — K219 Gastro-esophageal reflux disease without esophagitis: Secondary | ICD-10-CM | POA: Diagnosis not present

## 2020-08-09 DIAGNOSIS — E669 Obesity, unspecified: Secondary | ICD-10-CM

## 2020-08-09 MED ORDER — ALPRAZOLAM 0.5 MG PO TABS: 0.5000 mg | ORAL_TABLET | Freq: Two times a day (BID) | ORAL | 0 refills | Status: DC

## 2020-08-09 NOTE — Assessment & Plan Note (Signed)
Counseling °· If a person has gastroesophageal reflux disease (GERD), food and stomach acid move back up into the esophagus and cause symptoms or problems such as damage to the esophagus. °· Anti-reflux measures include: raising the head of the bed, avoiding tight clothing or belts, avoiding eating late at night, not lying down shortly after mealtime, and achieving weight loss. °· Avoid ASA, NSAID's, caffeine, alcohol, and tobacco.  °· OTC Pepcid and/or Tums are often very helpful for as needed use.  °· However, for persisting chronic or daily symptoms, stronger medications like Omeprazole may be needed. °· You may need to avoid foods and drinks such as: °? Coffee and tea (with or without caffeine). °? Drinks that contain alcohol. °? Energy drinks and sports drinks. °? Bubbly (carbonated) drinks or sodas. °? Chocolate and cocoa. °? Peppermint and mint flavorings. °? Garlic and onions. °? Horseradish. °? Spicy and acidic foods. These include peppers, chili powder, curry powder, vinegar, hot sauces, and BBQ sauce. °? Citrus fruit juices and citrus fruits, such as oranges, lemons, and limes. °? Tomato-based foods. These include red sauce, chili, salsa, and pizza with red sauce. °? Fried and fatty foods. These include donuts, french fries, potato chips, and high-fat dressings. °? High-fat meats. These include hot dogs, rib eye steak, sausage, ham, and bacon. ° °

## 2020-08-09 NOTE — Progress Notes (Signed)
Established Patient Office Visit  Subjective:  Patient ID: Alicia [name], female    DOB: 10-03-1950  Age: 70 y.o. MRN: [id number]  CC:  Chief Complaint  Patient presents with   Medication Refill    Medication Refill Associated symptoms include chills. Pertinent negatives include no abdominal pain, anorexia, arthralgias, chest pain, coughing, diaphoresis, headaches, myalgias, sore throat, swollen glands, urinary symptoms or vomiting.   Alicia [name] presents forcheck up  Past Medical History:  Diagnosis Date   Anxiety    Breast cancer, left ([place]) 07/2017   Depression    Hypertension    Hypothyroidism    Personal history of chemotherapy 2019   LEFT mastectomy-chemo before   Personal history of radiation therapy 03/2018   LEFT mastectomy   Rapid heart rate    Thyroid disease     Past Surgical History:  Procedure Laterality Date   AXILLARY LYMPH NODE BIOPSY Left 07/16/2017   METASTATIC MAMMARY CARCINOMA   BREAST BIOPSY Left 07/16/2017   [place] bx of left breast mass and left breast LN.  INVASIVE MAMMARY CARCINOMA, NO SPECIAL TYPE.    BREAST EXCISIONAL BIOPSY Right 2001   benign   BREAST LUMPECTOMY WITH SENTINEL LYMPH NODE BIOPSY Left 12/06/2017   Procedure: BREAST LUMPECTOMY WITH SENTINEL LYMPH NODE BX;  Surgeon: Robert Bellow, MD;  Location: [facility] OR[facility];  Service: General;  Laterality: Left;   COLONOSCOPY     MASTECTOMY Left 12/23/2017   PORTACATH PLACEMENT Right 08/07/2017   Procedure: INSERTION PORT-A-CATH;  Surgeon: Robert Bellow, MD;  Location: [facility];  Service: General;  Laterality: Right;   SIMPLE MASTECTOMY WITH AXILLARY SENTINEL NODE BIOPSY Left 12/23/2017   T2,N2 with 6/7 nodes positive. Whole breast radiation.  Surgeon: Robert Bellow, MD;  Location: [facility] OR[facility];  Service: General;  Laterality: Left;   TONSILLECTOMY      Family History  Problem Relation Age of Onset   Stroke Mother    Thyroid disease Mother    Renal Disease Mother    Stroke  Father    Heart attack Father    Sudden death Father 69       suicide   Anuerysm Brother    Breast cancer Neg Hx     Social History   Socioeconomic History   Marital status: Widowed    Spouse name: Not on file   Number of children: Not on file   Years of education: Not on file   Highest education level: Not on file  Occupational History   Not on file  Tobacco Use   Smoking status: Former    Packs/day: 1.00    Years: 30.00    Pack years: 30.00    Types: Cigarettes    Quit date: 2019    Years since quitting: 3.5   Smokeless tobacco: Never  Vaping Use   Vaping Use: Never used  Substance and Sexual Activity   Alcohol use: Not Currently   Drug use: Never   Sexual activity: Not Currently  Other Topics Concern   Not on file  Social History Narrative   Not on file   Social Determinants of Health   Financial Resource Strain: Not on file  Food Insecurity: Not on file  Transportation Needs: Not on file  Physical Activity: Not on file  Stress: Not on file  Social Connections: Not on file  Intimate Partner Violence: Not on file     Current Outpatient Medications:    acetaminophen (TYLENOL) 500 MG tablet, Take 1,000 mg  by mouth every 6 (six) hours as needed for moderate pain or headache. , Disp: , Rfl:    ALPRAZolam (XANAX) 0.5 MG tablet, Take 1 tablet (0.5 mg total) by mouth 2 (two) times daily., Disp: 60 tablet, Rfl: 0   aspirin EC 81 MG tablet, Take 81 mg by mouth at bedtime. , Disp: , Rfl:    calcium carbonate (OS-CAL - DOSED IN MG OF ELEMENTAL CALCIUM) 1250 (500 Ca) MG tablet, Take 1 tablet by mouth daily with breakfast., Disp: , Rfl:    Cholecalciferol (VITAMIN D3) 125 MCG (5000 UT) CAPS, Take 1 capsule by mouth 2 (two) times a day., Disp: , Rfl:    docusate sodium (COLACE) 100 MG capsule, Take 100 mg by mouth 3 (three) times daily as needed for mild constipation., Disp: , Rfl:    hydrochlorothiazide (HYDRODIURIL) 12.5 MG tablet, Take 1 tablet (12.5 mg total) by mouth  daily., Disp: 90 tablet, Rfl: 3   levothyroxine (SYNTHROID) 100 MCG tablet, TAKE 1 TABLET BY MOUTH DAILY, Disp: 90 tablet, Rfl: 3   losartan (COZAAR) 100 MG tablet, Take 1 tablet (100 mg total) by mouth daily., Disp: 90 tablet, Rfl: 3   metoprolol tartrate (LOPRESSOR) 25 MG tablet, Take 1 tablet (25 mg total) by mouth once for 1 dose. Take 2 hours prior to CTA., Disp: 1 tablet, Rfl: 0   nystatin-lidocaine-prednisoLONE-diphenhydrAMINE-distilled water-alum & mag hydroxide-simeth, Swish and spit 5-43m four times a day as needed, Disp: 480 mL, Rfl: 3   ondansetron (ZOFRAN) 8 MG tablet, Take 1 tablet (8 mg total) by mouth every 8 (eight) hours as needed for nausea or vomiting., Disp: 20 tablet, Rfl: 0   palbociclib (IBRANCE) 125 MG tablet, Take 1 tablet (125 mg total) by mouth daily. Take for 21 days, then hold for 7 days. Repeat every 28 days., Disp: 21 tablet, Rfl: 3   rosuvastatin (CRESTOR) 5 MG tablet, TAKE 1 TABLET BY MOUTH DAILY, Disp: 30 tablet, Rfl: 6   traZODone (DESYREL) 50 MG tablet, TAKE 1 TABLET BY MOUTH DAILY, Disp: 30 tablet, Rfl: 6   Allergies  Allergen Reactions   Sulfa Antibiotics Diarrhea    ROS Review of Systems  Constitutional:  Positive for chills. Negative for diaphoresis.  HENT: Negative.  Negative for sore throat.   Eyes: Negative.   Respiratory: Negative.  Negative for cough.   Cardiovascular: Negative.  Negative for chest pain.  Gastrointestinal: Negative.  Negative for abdominal pain, anorexia and vomiting.  Endocrine: Negative.   Genitourinary: Negative.   Musculoskeletal: Negative.  Negative for arthralgias and myalgias.  Skin: Negative.   Allergic/Immunologic: Negative.   Neurological: Negative.  Negative for headaches.  Hematological: Negative.   Psychiatric/Behavioral: Negative.    All other systems reviewed and are negative.    Objective:    Physical Exam Vitals reviewed.  Constitutional:      Appearance: Normal appearance.  HENT:      Mouth/Throat:     Mouth: Mucous membranes are moist.  Eyes:     Pupils: Pupils are equal, round, and reactive to light.  Neck:     Vascular: No carotid bruit.  Cardiovascular:     Rate and Rhythm: Normal rate and regular rhythm.     Pulses: Normal pulses.     Heart sounds: Normal heart sounds.  Pulmonary:     Effort: Pulmonary effort is normal.     Breath sounds: Normal breath sounds.  Chest:       Comments: Lt mastectomy Abdominal:     General:  Bowel sounds are normal.     Palpations: Abdomen is soft. There is no hepatomegaly, splenomegaly or mass.     Tenderness: There is no abdominal tenderness.     Hernia: No hernia is present.  Musculoskeletal:        General: No tenderness.     Cervical back: Neck supple.     Right lower leg: No edema.     Left lower leg: No edema.  Skin:    Findings: No rash.  Neurological:     Mental Status: She is alert and oriented to person, place, and time.     Motor: No weakness.  Psychiatric:        Mood and Affect: Mood and affect normal.        Behavior: Behavior normal.    BP (!) 108/56   Pulse 78   Ht '5\' 6"'$  (1.676 m)   Wt 224 lb (101.6 kg)   BMI 36.15 kg/m  Wt Readings from Last 3 Encounters:  08/09/20 224 lb (101.6 kg)  07/14/20 223 lb 9.6 oz (101.4 kg)  06/16/20 223 lb 4.8 oz (101.3 kg)     Health Maintenance Due  Topic Date Due   Hepatitis C Screening  Never done   Zoster Vaccines- Shingrix (1 of 2) Never done   COLONOSCOPY (Pts 45-45yr Insurance coverage will need to be confirmed)  Never done   PNA vac Low Risk Adult (1 of 2 - PCV13) Never done   COVID-19 Vaccine (3 - Pfizer risk series) 06/30/2019    There are no preventive care reminders to display for this patient.  Lab Results  Component Value Date   TSH 0.925 10/27/2019   Lab Results  Component Value Date   WBC 2.9 (L) 07/14/2020   HGB 11.2 (L) 07/14/2020   HCT 33.0 (L) 07/14/2020   MCV 104.4 (H) 07/14/2020   PLT 230 07/14/2020   Lab Results   Component Value Date   NA 133 (L) 07/14/2020   K 4.1 07/14/2020   CO2 24 07/14/2020   GLUCOSE 130 (H) 07/14/2020   BUN 24 (H) 07/14/2020   CREATININE 1.62 (H) 07/14/2020   BILITOT 1.0 07/14/2020   ALKPHOS 77 07/14/2020   AST 27 07/14/2020   ALT 18 07/14/2020   PROT 7.7 07/14/2020   ALBUMIN 4.3 07/14/2020   CALCIUM 9.6 07/14/2020   ANIONGAP 10 07/14/2020   Lab Results  Component Value Date   CHOL 202 (H) 12/15/2019   Lab Results  Component Value Date   HDL 104 12/15/2019   Lab Results  Component Value Date   LDLCALC 87 12/15/2019   Lab Results  Component Value Date   TRIG 60 12/15/2019   Lab Results  Component Value Date   CHOLHDL 1.9 12/15/2019   Lab Results  Component Value Date   HGBA1C 5.9 (H) 10/04/2017      Assessment & Plan:   Problem List Items Addressed This Visit       Cardiovascular and Mediastinum   Primary hypertension - Primary     Patient denies any chest pain or shortness of breath there is no history of palpitation or paroxysmal nocturnal dyspnea   patient was advised to follow low-salt low-cholesterol diet    ideally I want to keep systolic blood pressure below 130 mmHg, patient was asked to check blood pressure one times a week and give me a report on that.  Patient will be follow-up in 3 months  or earlier as needed, patient will call me  back for any change in the cardiovascular symptoms Patient was advised to buy a book from local bookstore concerning blood pressure and read several chapters  every day.  This will be supplemented by some of the material we will give him from the office.  Patient should also utilize other resources like YouTube and Internet to learn more about the blood pressure and the diet.         Digestive   Gastroesophageal reflux disease without esophagitis    Counseling  If a person has gastroesophageal reflux disease (GERD), food and stomach acid move back up into the esophagus and cause symptoms or problems  such as damage to the esophagus.  Anti-reflux measures include: raising the head of the bed, avoiding tight clothing or belts, avoiding eating late at night, not lying down shortly after mealtime, and achieving weight loss.  Avoid ASA, NSAID's, caffeine, alcohol, and tobacco.   OTC Pepcid and/or Tums are often very helpful for as needed use.   However, for persisting chronic or daily symptoms, stronger medications like Omeprazole may be needed.  You may need to avoid foods and drinks such as: ? Coffee and tea (with or without caffeine). ? Drinks that contain alcohol. ? Energy drinks and sports drinks. ? Bubbly (carbonated) drinks or sodas. ? Chocolate and cocoa. ? Peppermint and mint flavorings. ? Garlic and onions. ? Horseradish. ? Spicy and acidic foods. These include peppers, chili powder, curry powder, vinegar, hot sauces, and BBQ sauce. ? Citrus fruit juices and citrus fruits, such as oranges, lemons, and limes. ? Tomato-based foods. These include red sauce, chili, salsa, and pizza with red sauce. ? Fried and fatty foods. These include donuts, french fries, potato chips, and high-fat dressings. ? High-fat meats. These include hot dogs, rib eye steak, sausage, ham, and bacon.          Other   Anxiety    - Patient experiencing high levels of anxiety.  - Encouraged patient to engage in relaxing activities like yoga, meditation, journaling, going for a walk, or participating in a hobby.  - Encouraged patient to reach out to trusted friends or family members about recent struggles, Patient was advised to read A book, how to stop worrying and start living, it is good book to read to control  the stress        Relevant Medications   ALPRAZolam (XANAX) 0.5 MG tablet   Obesity (BMI 35.0-39.9 without comorbidity)    - I encouraged the patient to lose weight.  - I educated them on making healthy dietary choices including eating more fruits and vegetables and less fried foods. -  I encouraged the patient to exercise more, and educated on the benefits of exercise including weight loss, diabetes prevention, and hypertension prevention.   Dietary counseling with a registered dietician  Referral to a weight management support group (e.g. Weight Watchers, Overeaters Anonymous)  If your BMI is greater than 29 or you have gained more than 15 pounds you should work on weight loss.  Attend a healthy cooking class         Meds ordered this encounter  Medications   ALPRAZolam (XANAX) 0.5 MG tablet    Sig: Take 1 tablet (0.5 mg total) by mouth 2 (two) times daily.    Dispense:  60 tablet    Refill:  0    Follow-up: No follow-ups on file.    [name], MD

## 2020-08-09 NOTE — Assessment & Plan Note (Signed)
-   Patient experiencing high levels of anxiety.  - Encouraged patient to engage in relaxing activities like yoga, meditation, journaling, going for a walk, or participating in a hobby.  - Encouraged patient to reach out to trusted friends or family members about recent struggles, Patient was advised to read A book, how to stop worrying and start living, it is good book to read to control  the stress  

## 2020-08-09 NOTE — Assessment & Plan Note (Signed)
-   I encouraged the patient to lose weight.  - I educated them on making healthy dietary choices including eating more fruits and vegetables and less fried foods. - I encouraged the patient to exercise more, and educated on the benefits of exercise including weight loss, diabetes prevention, and hypertension prevention.   Dietary counseling with a registered dietician  Referral to a weight management support group (e.g. Weight Watchers, Overeaters Anonymous)  If your BMI is greater than 29 or you have gained more than 15 pounds you should work on weight loss.  Attend a healthy cooking class  

## 2020-08-09 NOTE — Assessment & Plan Note (Signed)

## 2020-08-10 ENCOUNTER — Other Ambulatory Visit (HOSPITAL_COMMUNITY): Payer: Self-pay

## 2020-08-10 ENCOUNTER — Other Ambulatory Visit: Payer: Self-pay | Admitting: Internal Medicine

## 2020-08-16 ENCOUNTER — Inpatient Hospital Stay: Payer: Medicare Other | Attending: Oncology

## 2020-08-16 ENCOUNTER — Inpatient Hospital Stay: Payer: Medicare Other

## 2020-08-16 ENCOUNTER — Inpatient Hospital Stay: Payer: Medicare Other | Admitting: Pharmacist

## 2020-08-16 DIAGNOSIS — Z5111 Encounter for antineoplastic chemotherapy: Secondary | ICD-10-CM | POA: Diagnosis not present

## 2020-08-16 DIAGNOSIS — C50912 Malignant neoplasm of unspecified site of left female breast: Secondary | ICD-10-CM | POA: Diagnosis not present

## 2020-08-16 DIAGNOSIS — Z79899 Other long term (current) drug therapy: Secondary | ICD-10-CM | POA: Insufficient documentation

## 2020-08-16 DIAGNOSIS — C50919 Malignant neoplasm of unspecified site of unspecified female breast: Secondary | ICD-10-CM

## 2020-08-16 LAB — COMPREHENSIVE METABOLIC PANEL WITH GFR
ALT: 16 U/L (ref 0–44)
AST: 20 U/L (ref 15–41)
Albumin: 4.3 g/dL (ref 3.5–5.0)
Alkaline Phosphatase: 73 U/L (ref 38–126)
Anion gap: 8 (ref 5–15)
BUN: 21 mg/dL (ref 8–23)
CO2: 27 mmol/L (ref 22–32)
Calcium: 8.9 mg/dL (ref 8.9–10.3)
Chloride: 98 mmol/L (ref 98–111)
Creatinine, Ser: 1.41 mg/dL — ABNORMAL HIGH (ref 0.44–1.00)
GFR, Estimated: 40 mL/min — ABNORMAL LOW (ref >60)
Glucose, Bld: 101 mg/dL — ABNORMAL HIGH (ref 70–99)
Potassium: 3.8 mmol/L (ref 3.5–5.1)
Sodium: 133 mmol/L — ABNORMAL LOW (ref 135–145)
Total Bilirubin: 0.8 mg/dL (ref 0.3–1.2)
Total Protein: 7.3 g/dL (ref 6.5–8.1)

## 2020-08-16 LAB — CBC WITH DIFFERENTIAL/PLATELET
Abs Immature Granulocytes: 0.02 K/uL (ref 0.00–0.07)
Basophils Absolute: 0 K/uL (ref 0.0–0.1)
Basophils Relative: 2 %
Eosinophils Absolute: 0 K/uL (ref 0.0–0.5)
Eosinophils Relative: 2 %
HCT: 31.2 % — ABNORMAL LOW (ref 36.0–46.0)
Hemoglobin: 10.7 g/dL — ABNORMAL LOW (ref 12.0–15.0)
Immature Granulocytes: 1 %
Lymphocytes Relative: 17 %
Lymphs Abs: 0.3 K/uL — ABNORMAL LOW (ref 0.7–4.0)
MCH: 36.6 pg — ABNORMAL HIGH (ref 26.0–34.0)
MCHC: 34.3 g/dL (ref 30.0–36.0)
MCV: 106.8 fL — ABNORMAL HIGH (ref 80.0–100.0)
Monocytes Absolute: 0.2 K/uL (ref 0.1–1.0)
Monocytes Relative: 11 %
Neutro Abs: 1.3 K/uL — ABNORMAL LOW (ref 1.7–7.7)
Neutrophils Relative %: 67 %
Platelets: 276 K/uL (ref 150–400)
RBC: 2.92 MIL/uL — ABNORMAL LOW (ref 3.87–5.11)
RDW: 14 % (ref 11.5–15.5)
Smear Review: NORMAL
WBC: 2 K/uL — ABNORMAL LOW (ref 4.0–10.5)
nRBC: 0 % (ref 0.0–0.2)

## 2020-08-16 MED ORDER — PALBOCICLIB 125 MG PO TABS
125.0000 mg | ORAL_TABLET | Freq: Every day | ORAL | 3 refills | Status: DC
  Filled 2020-08-16: qty 21, 21d supply, fill #0
  Filled 2020-08-26: qty 21, 28d supply, fill #0
  Filled 2020-09-22: qty 21, 28d supply, fill #1
  Filled 2020-10-18: qty 21, 28d supply, fill #2
  Filled 2020-11-22: qty 21, 28d supply, fill #3

## 2020-08-16 MED ORDER — FULVESTRANT 250 MG/5ML IM SOLN
500.0000 mg | Freq: Once | INTRAMUSCULAR | Status: AC
  Administered 2020-08-16: 500 mg via INTRAMUSCULAR
  Filled 2020-08-16: qty 10

## 2020-08-16 NOTE — Progress Notes (Signed)
[name]  Telephone:(336) [phone] Fax:(336) [phone]  Patient Care Team: [name], [name], Harrell Gave, [name] Rico Junker, RN as Registered Nurse Grayland Ormond Kathlene November, MD as Consulting Physician (Oncology) Bary Castilla Forest Gleason, MD as Consulting Physician (General Surgery) Bary Castilla Forest Gleason, MD as Consulting Physician (General Surgery) Noreene Filbert, MD as Referring Physician (Radiation Oncology) Jeral Fruit, RN as Registered Nurse   Name of the patient: Alicia [name]  [id number]  [birth year]   Date of visit: 08/16/20  HPI: Patient is a 70 y.o. female with recurrent stage IV ER/PR positive, HER2 negative breast cancer. Patient started on Ibrance on 07/09/19.  Reason for Consult: Oral chemotherapy follow-up for Ibrance (palbociclib) therapy.   PAST MEDICAL HISTORY: Past Medical History:  Diagnosis Date   Anxiety    Breast cancer, left ([place]) 07/2017   Depression    Hypertension    Hypothyroidism    Personal history of chemotherapy 2019   LEFT mastectomy-chemo before   Personal history of radiation therapy 03/2018   LEFT mastectomy   Rapid heart rate    Thyroid disease     PAST SURGICAL HISTORY:  Past Surgical History:  Procedure Laterality Date   AXILLARY LYMPH NODE BIOPSY Left 07/16/2017   METASTATIC MAMMARY CARCINOMA   BREAST BIOPSY Left 07/16/2017   [place] bx of left breast mass and left breast LN.  INVASIVE MAMMARY CARCINOMA, NO SPECIAL TYPE.    BREAST EXCISIONAL BIOPSY Right 2001   benign   BREAST LUMPECTOMY WITH SENTINEL LYMPH NODE BIOPSY Left 12/06/2017   Procedure: BREAST LUMPECTOMY WITH SENTINEL LYMPH NODE BX;  Surgeon: Robert Bellow, MD;  Location: [facility] ORS;  Service: General;  Laterality: Left;   COLONOSCOPY     MASTECTOMY Left 12/23/2017   PORTACATH PLACEMENT Right 08/07/2017   Procedure: INSERTION PORT-A-CATH;  Surgeon:  Robert Bellow, MD;  Location: [facility] OR[facility];  Service: General;  Laterality: Right;   SIMPLE MASTECTOMY WITH AXILLARY SENTINEL NODE BIOPSY Left 12/23/2017   T2,N2 with 6/7 nodes positive. Whole breast radiation.  Surgeon: Robert Bellow, MD;  Location: [facility] ORS;  Service: General;  Laterality: Left;   TONSILLECTOMY      HEMATOLOGY/ONCOLOGY HISTORY:  Oncology History Overview Note  Patient is a 70 year old female who recently self palpated a mass on her left breast.  Subsequent imaging and biopsy revealed the above-stated breast cancer.  Case was also discussed extensively at case conference.  Given the size and the stage of patient's malignancy, she will benefit from neoadjuvant chemotherapy using Adriamycin, Cytoxan, and Taxol.  Patient will also require Neulasta support.  Will get CT scan of the chest, abdomen, and pelvis to assess for any metastatic disease.  Patient will also require port placement and MUGA prior to initiating treatment  CT abdomen/pelvis/chest did not reveal any suspicious lesions concerning for metastatic disease. (08/01/17)  Port-A-Cath placed on 08/07/2017.  Cycle 1 day 1 of AC was given on 08/08/17.     Breast cancer, stage 2, left ([place])  07/24/2017 Initial Diagnosis   Breast cancer, stage 2, left ([place])    07/30/2017 Cancer Staging   Staging form: Breast, AJCC 8th Edition - Clinical stage from 07/30/2017: Stage IIA (cT2, cN1, cM0, G2, ER+, PR+, HER2-) - Signed by Lloyd Huger, MD on 07/30/2017    08/08/2017 - 10/31/2017 Chemotherapy   The patient had DOXOrubicin (ADRIAMYCIN) chemo injection 130 mg, 60 mg/m2 = 130 mg, Intravenous,  Once, 4 of 4 cycles Administration: 130 mg (08/08/2017), 130 mg (08/22/2017), 130 mg (09/05/2017), 130 mg (09/19/2017) palonosetron (ALOXI) injection 0.25 mg, 0.25 mg, Intravenous,  Once, 4 of 4 cycles Administration: 0.25 mg (08/08/2017), 0.25 mg (08/22/2017), 0.25 mg (09/05/2017), 0.25 mg (09/19/2017) pegfilgrastim-cbqv (UDENYCA) injection  6 mg, 6 mg, Subcutaneous, Once, 4 of 4 cycles Administration: 6 mg (08/09/2017), 6 mg (08/23/2017), 6 mg (09/06/2017), 6 mg (09/20/2017) cyclophosphamide (CYTOXAN) 1,300 mg in sodium chloride 0.9 % 250 mL chemo infusion, 600 mg/m2 = 1,300 mg, Intravenous,  Once, 4 of 4 cycles Administration: 1,300 mg (08/08/2017), 1,300 mg (08/22/2017), 1,300 mg (09/05/2017), 1,300 mg (09/19/2017) PACLitaxel (TAXOL) 174 mg in sodium chloride 0.9 % 250 mL chemo infusion (</= 34m/m2), 80 mg/m2 = 174 mg, Intravenous,  Once, 4 of 12 cycles Dose modification: 72 mg/m2 (original dose 80 mg/m2, Cycle 6, Reason: Dose not tolerated) Administration: 174 mg (10/03/2017), 156 mg (10/17/2017), 156 mg (10/24/2017), 156 mg (10/31/2017) fosaprepitant (EMEND) 150 mg, dexamethasone (DECADRON) 12 mg in sodium chloride 0.9 % 145 mL IVPB, , Intravenous,  Once, 4 of 4 cycles Administration:  (08/08/2017),  (08/22/2017),  (09/05/2017),  (09/19/2017)   for chemotherapy treatment.       ALLERGIES:  is allergic to sulfa antibiotics.  MEDICATIONS:  Current Outpatient Medications  Medication Sig Dispense Refill   acetaminophen (TYLENOL) 500 MG tablet Take 1,000 mg by mouth every 6 (six) hours as needed for moderate pain or headache.      ALPRAZolam (XANAX) 0.5 MG tablet Take 1 tablet (0.5 mg total) by mouth 2 (two) times daily. 60 tablet 0   aspirin EC 81 MG tablet Take 81 mg by mouth at bedtime.      calcium carbonate (OS-CAL - DOSED IN MG OF ELEMENTAL CALCIUM) 1250 (500 Ca) MG tablet Take 1 tablet by mouth daily with breakfast.     Cholecalciferol (VITAMIN D3) 125 MCG (5000 UT) CAPS Take 1 capsule by mouth 2 (two) times a day.     docusate sodium (COLACE) 100 MG capsule Take 100 mg by mouth 3 (three) times daily as needed for mild constipation.     hydrochlorothiazide (HYDRODIURIL) 12.5 MG tablet Take 1 tablet (12.5 mg total) by mouth daily. 90 tablet 3   levothyroxine (SYNTHROID) 100 MCG tablet TAKE 1 TABLET BY MOUTH DAILY 90 tablet 3   losartan  (COZAAR) 100 MG tablet Take 1 tablet (100 mg total) by mouth daily. 90 tablet 3   metoprolol tartrate (LOPRESSOR) 25 MG tablet Take 1 tablet (25 mg total) by mouth once for 1 dose. Take 2 hours prior to CTA. 1 tablet 0   nystatin-lidocaine-prednisoLONE-diphenhydrAMINE-distilled water-alum & mag hydroxide-simeth Swish and spit 5-136mfour times a day as needed 480 mL 3   ondansetron (ZOFRAN) 8 MG tablet Take 1 tablet (8 mg total) by mouth every 8 (eight) hours as needed for nausea or vomiting. 20 tablet 0   palbociclib (IBRANCE) 125 MG tablet Take 1 tablet (125 mg total) by mouth daily. Take for 21 days, then hold for 7 days. Repeat every 28 days. 21 tablet 3   rosuvastatin (CRESTOR) 5 MG tablet TAKE 1 TABLET BY MOUTH DAILY 30 tablet 6   traZODone (DESYREL) 50 MG tablet TAKE 1 TABLET BY MOUTH DAILY 30 tablet 6   No current facility-administered medications for this visit.    VITAL SIGNS: There were no vitals taken for this visit. There were no vitals filed for this visit.  Estimated body mass index is 36.15 kg/m as calculated  from the following:   Height as of 08/09/20: _0  (1.676 m).   Weight as of 08/09/20: 101.6 kg (224 lb).  LABS: CBC:    Component Value Date/Time   WBC 2.9 (L) 07/14/2020 1401   HGB 11.2 (L) 07/14/2020 1401   HGB 14.2 02/11/2011 1349   HCT 33.0 (L) 07/14/2020 1401   HCT 40.9 02/11/2011 1349   PLT 230 07/14/2020 1401   PLT 151 02/11/2011 1349   MCV 104.4 (H) 07/14/2020 1401   MCV 89 02/11/2011 1349   NEUTROABS 2.0 07/14/2020 1401   LYMPHSABS 0.4 (L) 07/14/2020 1401   MONOABS 0.4 07/14/2020 1401   EOSABS 0.0 07/14/2020 1401   BASOSABS 0.1 07/14/2020 1401   Comprehensive Metabolic Panel:    Component Value Date/Time   NA 133 (L) 07/14/2020 1401   NA 142 02/11/2011 1349   K 4.1 07/14/2020 1401   K 3.8 02/11/2011 1349   CL 99 07/14/2020 1401   CL 107 02/11/2011 1349   CO2 24 07/14/2020 1401   CO2 24 02/11/2011 1349   BUN 24 (H) 07/14/2020 1401   BUN 14  02/11/2011 1349   CREATININE 1.62 (H) 07/14/2020 1401   CREATININE 0.65 02/11/2011 1349   GLUCOSE 130 (H) 07/14/2020 1401   GLUCOSE 98 02/11/2011 1349   CALCIUM 9.6 07/14/2020 1401   CALCIUM 9.0 02/11/2011 1349   AST 27 07/14/2020 1401   AST 27 02/11/2011 1349   ALT 18 07/14/2020 1401   ALT 36 02/11/2011 1349   ALKPHOS 77 07/14/2020 1401   ALKPHOS 122 02/11/2011 1349   BILITOT 1.0 07/14/2020 1401   BILITOT 0.5 02/11/2011 1349   PROT 7.7 07/14/2020 1401   PROT 7.0 02/11/2011 1349   ALBUMIN 4.3 07/14/2020 1401   ALBUMIN 4.1 02/11/2011 1349    RADIOGRAPHIC STUDIES: No results found.   Assessment and Plan-  Reviewed CBC and CMP, ANC decreased but no palbociclib dose adjustment indicated and ALT/AST/T.bili within normal limits. CA 27.29 pending today, but the last 2 checks have trended up slightly. Based on today's result Dr. Grayland Ormond may order repeat imagining.  Continue palbociclib 127m daily for 21 days on and 7 days off   Oral Chemotherapy Side Effect/Intolerance:  Constipation: still occurring but she is able to manage using OTC medication, and adjust her bowel regimen as needed Fatigue: Ms. HCorprewreports feeing fatigued about 3 days/week. She feels like this is an increase from her normal fatigue baseline.  No reported diarrhea, nausea, or mouth sores  Other: She reported one night of night chills, she took her temperature and she was not feverous. This was about 3 weeks ago and has not occurred since then.   Oral Chemotherapy Adherence: No reported missed doses  Medication Access Issues: no issues[facility]  Patient expressed understanding and was in agreement with this plan. She also understands that She can call clinic at any time with any questions, concerns, or complaints.   Thank you for allowing me to participate in the care of this very pleasant patient.   Time Total: 15 mins  Visit consisted  of counseling and education on dealing with issues of symptom management in the setting of serious and potentially life-threatening illness.Greater than 50%  of this time was spent counseling and coordinating care related to the above assessment and plan.  Signed by: ADarl Pikes PharmD, BCPS, BSalley Slaughter CPP Hematology/Oncology Clinical Pharmacist Practitioner [phone] 08/16/2020  8:52 AM

## 2020-08-17 ENCOUNTER — Other Ambulatory Visit (HOSPITAL_COMMUNITY): Payer: Self-pay

## 2020-08-17 LAB — CANCER ANTIGEN 27.29: CA 27.29: 55.8 U/mL — ABNORMAL HIGH (ref 0.0–38.6)

## 2020-08-26 ENCOUNTER — Other Ambulatory Visit (HOSPITAL_COMMUNITY): Payer: Self-pay

## 2020-08-30 ENCOUNTER — Other Ambulatory Visit (HOSPITAL_COMMUNITY): Payer: Self-pay

## 2020-08-31 ENCOUNTER — Other Ambulatory Visit (HOSPITAL_COMMUNITY): Payer: Self-pay

## 2020-09-08 ENCOUNTER — Other Ambulatory Visit: Payer: Self-pay

## 2020-09-08 ENCOUNTER — Ambulatory Visit
Admission: RE | Admit: 2020-09-08 | Discharge: 2020-09-08 | Disposition: A | Discharge status: Home / Self Care | Payer: Medicare Other | Source: Ambulatory Visit | Attending: Oncology | Admitting: Oncology

## 2020-09-08 DIAGNOSIS — Z1231 Encounter for screening mammogram for malignant neoplasm of breast: Secondary | ICD-10-CM | POA: Diagnosis not present

## 2020-09-09 ENCOUNTER — Other Ambulatory Visit (HOSPITAL_COMMUNITY): Payer: Self-pay

## 2020-09-16 ENCOUNTER — Inpatient Hospital Stay: Payer: Medicare Other

## 2020-09-16 ENCOUNTER — Inpatient Hospital Stay: Payer: Medicare Other | Attending: Oncology

## 2020-09-16 DIAGNOSIS — C7951 Secondary malignant neoplasm of bone: Secondary | ICD-10-CM | POA: Diagnosis not present

## 2020-09-16 DIAGNOSIS — Z5111 Encounter for antineoplastic chemotherapy: Secondary | ICD-10-CM | POA: Diagnosis not present

## 2020-09-16 DIAGNOSIS — C50919 Malignant neoplasm of unspecified site of unspecified female breast: Secondary | ICD-10-CM

## 2020-09-16 DIAGNOSIS — Z79899 Other long term (current) drug therapy: Secondary | ICD-10-CM | POA: Diagnosis not present

## 2020-09-16 DIAGNOSIS — C50912 Malignant neoplasm of unspecified site of left female breast: Secondary | ICD-10-CM

## 2020-09-16 LAB — CBC WITH DIFFERENTIAL/PLATELET
Abs Immature Granulocytes: 0.01 10*3/uL (ref 0.00–0.07)
Basophils Absolute: 0 10*3/uL (ref 0.0–0.1)
Basophils Relative: 1 %
Eosinophils Absolute: 0 10*3/uL (ref 0.0–0.5)
Eosinophils Relative: 1 %
HCT: 30.7 % — ABNORMAL LOW (ref 36.0–46.0)
Hemoglobin: 10.5 g/dL — ABNORMAL LOW (ref 12.0–15.0)
Immature Granulocytes: 1 %
Lymphocytes Relative: 19 %
Lymphs Abs: 0.4 10*3/uL — ABNORMAL LOW (ref 0.7–4.0)
MCH: 35.8 pg — ABNORMAL HIGH (ref 26.0–34.0)
MCHC: 34.2 g/dL (ref 30.0–36.0)
MCV: 104.8 fL — ABNORMAL HIGH (ref 80.0–100.0)
Monocytes Absolute: 0.2 10*3/uL (ref 0.1–1.0)
Monocytes Relative: 11 %
Neutro Abs: 1.4 10*3/uL — ABNORMAL LOW (ref 1.7–7.7)
Neutrophils Relative %: 67 %
Platelets: 232 10*3/uL (ref 150–400)
RBC: 2.93 MIL/uL — ABNORMAL LOW (ref 3.87–5.11)
RDW: 14.4 % (ref 11.5–15.5)
Smear Review: NORMAL
WBC: 2.1 10*3/uL — ABNORMAL LOW (ref 4.0–10.5)
nRBC: 0 % (ref 0.0–0.2)

## 2020-09-16 LAB — COMPREHENSIVE METABOLIC PANEL
ALT: 17 U/L (ref 0–44)
AST: 20 U/L (ref 15–41)
Albumin: 4.2 g/dL (ref 3.5–5.0)
Alkaline Phosphatase: 71 U/L (ref 38–126)
Anion gap: 10 (ref 5–15)
BUN: 24 mg/dL — ABNORMAL HIGH (ref 8–23)
CO2: 26 mmol/L (ref 22–32)
Calcium: 9.7 mg/dL (ref 8.9–10.3)
Chloride: 98 mmol/L (ref 98–111)
Creatinine, Ser: 1.59 mg/dL — ABNORMAL HIGH (ref 0.44–1.00)
GFR, Estimated: 35 mL/min — ABNORMAL LOW (ref >60)
Glucose, Bld: 99 mg/dL (ref 70–99)
Potassium: 3.7 mmol/L (ref 3.5–5.1)
Sodium: 134 mmol/L — ABNORMAL LOW (ref 135–145)
Total Bilirubin: 1 mg/dL (ref 0.3–1.2)
Total Protein: 7.5 g/dL (ref 6.5–8.1)

## 2020-09-16 MED ORDER — FULVESTRANT 250 MG/5ML IM SOLN
500.0000 mg | Freq: Once | INTRAMUSCULAR | Status: AC
  Administered 2020-09-16: 500 mg via INTRAMUSCULAR
  Filled 2020-09-16: qty 10

## 2020-09-17 LAB — CANCER ANTIGEN 27.29: CA 27.29: 60.4 U/mL — ABNORMAL HIGH (ref 0.0–38.6)

## 2020-09-21 ENCOUNTER — Other Ambulatory Visit: Payer: Self-pay | Admitting: Internal Medicine

## 2020-09-22 ENCOUNTER — Other Ambulatory Visit (HOSPITAL_COMMUNITY): Payer: Self-pay

## 2020-09-22 ENCOUNTER — Ambulatory Visit (INDEPENDENT_AMBULATORY_CARE_PROVIDER_SITE_OTHER): Payer: Medicare Other | Admitting: *Deleted

## 2020-09-22 DIAGNOSIS — Z0000 Encounter for general adult medical examination without abnormal findings: Secondary | ICD-10-CM

## 2020-09-22 NOTE — Progress Notes (Signed)
Subjective:   Alicia [name] is a 70 y.o. female who presents for Medicare Annual (Subsequent) preventive examination. Visit performed using audio  Patient:home Provider:home   Review of Systems    Defer to provider       Objective:    Today's Vitals   There is no height or weight on file to calculate BMI.  Advanced Directives 09/22/2020 05/19/2020 03/17/2020 02/18/2020 02/11/2020 12/24/2019 11/26/2019  Does Patient Have a Medical Advance Directive? No No No No No No No  Would patient like information on creating a medical advance directive? No - Patient declined No - Patient declined No - Patient declined - No - Patient declined No - Patient declined No - Patient declined    Current Medications (verified) Outpatient Encounter Medications as of 09/22/2020  Medication Sig   acetaminophen (TYLENOL) 500 MG tablet Take 1,000 mg by mouth every 6 (six) hours as needed for moderate pain or headache.    ALPRAZolam (XANAX) 0.5 MG tablet Take 1 tablet (0.5 mg total) by mouth 2 (two) times daily.   aspirin EC 81 MG tablet Take 81 mg by mouth at bedtime.    calcium carbonate (OS-CAL - DOSED IN MG OF ELEMENTAL CALCIUM) 1250 (500 Ca) MG tablet Take 1 tablet by mouth daily with breakfast.   Cholecalciferol (VITAMIN D3) 125 MCG (5000 UT) CAPS Take 1 capsule by mouth 2 (two) times a day.   docusate sodium (COLACE) 100 MG capsule Take 100 mg by mouth 3 (three) times daily as needed for mild constipation.   hydrochlorothiazide (HYDRODIURIL) 12.5 MG tablet Take 1 tablet (12.5 mg total) by mouth daily.   levothyroxine (SYNTHROID) 100 MCG tablet TAKE 1 TABLET BY MOUTH DAILY   losartan (COZAAR) 100 MG tablet Take 1 tablet (100 mg total) by mouth daily.   nystatin-lidocaine-prednisoLONE-diphenhydrAMINE-distilled water-alum & mag hydroxide-simeth Swish and spit 5-22m four times a day as needed   ondansetron (ZOFRAN) 8 MG tablet Take 1 tablet (8 mg total) by mouth every 8 (eight) hours as needed for nausea or  vomiting.   palbociclib (IBRANCE) 125 MG tablet Take 1 tablet (125 mg total) by mouth daily. Take for 21 days, then hold for 7 days. Repeat every 28 days.   rosuvastatin (CRESTOR) 5 MG tablet TAKE 1 TABLET BY MOUTH DAILY   traZODone (DESYREL) 50 MG tablet TAKE 1 TABLET BY MOUTH DAILY   metoprolol tartrate (LOPRESSOR) 25 MG tablet Take 1 tablet (25 mg total) by mouth once for 1 dose. Take 2 hours prior to CTA.   No facility-administered encounter medications on file as of 09/22/2020.    Allergies (verified) Sulfa antibiotics   History: Past Medical History:  Diagnosis Date   Anxiety    [place]   Depression    Hypertension    Hypothyroidism    Personal history of chemotherapy 2019   LEFT mastectomy-chemo before   Personal history of radiation therapy 03/2018   LEFT mastectomy   Rapid heart rate    Thyroid disease    Past Surgical History:  Procedure Laterality Date   AXILLARY LYMPH NODE BIOPSY Left 07/16/2017   METASTATIC MAMMARY CARCINOMA   BREAST BIOPSY Left 07/16/2017   u[place]abx of left breast mass and left breast LN.  INVASIVE MAMMARY CARCINOMA, NO SPECIAL TYPE.    BREAST EXCISIONAL BIOPSY Right 2001   benign   BREAST LUMPECTOMY WITH SENTINEL LYMPH NODE BIOPSY Left 12/06/2017   Procedure: BREAST LUMPECTOMY WITH SENTINEL LYMPH NODE BX;  Surgeon: BBary Castilla  Forest Gleason, MD;  Location: [facility] ORS;  Service: General;  Laterality: Left;   COLONOSCOPY     MASTECTOMY Left 12/23/2017   PORTACATH PLACEMENT Right 08/07/2017   Procedure: INSERTION PORT-A-CATH;  Surgeon: Robert Bellow, MD;  Location: [facility] ORS;  Service: General;  Laterality: Right;   SIMPLE MASTECTOMY WITH AXILLARY SENTINEL NODE BIOPSY Left 12/23/2017   T2,N2 with 6/7 nodes positive. Whole breast radiation.  Surgeon: Robert Bellow, MD;  Location: [facility] ORS;  Service: General;  Laterality: Left;   TONSILLECTOMY     Family History  Problem Relation Age of Onset   Stroke Mother    Thyroid  disease Mother    Renal Disease Mother    Stroke Father    Heart attack Father    Sudden death Father 73       suicide   Anuerysm Brother    Breast cancer Neg Hx    Social History   Socioeconomic History   Marital status: Widowed    Spouse name: Not on file   Number of children: Not on file   Years of education: Not on file   Highest education level: Not on file  Occupational History   Not on file  Tobacco Use   Smoking status: Former    Packs/day: 1.00    Years: 30.00    Pack years: 30.00    Types: Cigarettes    Quit date: 2019    Years since quitting: 3.6   Smokeless tobacco: Never  Vaping Use   Vaping Use: Never used  Substance and Sexual Activity   Alcohol use: Not Currently   Drug use: Never   Sexual activity: Not Currently  Other Topics Concern   Not on file  Social History Narrative   Not on file   Social Determinants of Health   Financial Resource Strain: Low Risk    Difficulty of Paying Living Expenses: Not very hard  Food Insecurity: Food Insecurity Present   Worried About Running Out of Food in the Last Year: Sometimes true   Ran Out of Food in the Last Year: Never true  Transportation Needs: No Transportation Needs   Lack of Transportation (Medical): No   Lack of Transportation (Non-Medical): No  Physical Activity: Insufficiently Active   Days of Exercise per Week: 2 days   Minutes of Exercise per Session: 20 min  Stress: No Stress Concern Present   Feeling of Stress : Not at all  Social Connections: Socially Isolated   Frequency of Communication with Friends and Family: Twice a week   Frequency of Social Gatherings with Friends and Family: Twice a week   Attends Religious Services: Never   Marine scientist or Organizations: No   Attends Archivist Meetings: Never   Marital Status: Widowed    Tobacco Counseling Counseling given: Not Answered   Clinical Intake:  Pre-visit preparation completed: Yes  Pain : No/denies  pain Pain Score:  (patient currenlty receiving radiation for bone cancer but denies pain) Pain Type: Other (Comment) Pain Location: Elbow     Nutritional Risks: None Diabetes: No  What is the last grade level you completed in school?: 12  Diabetic?no  Interpreter Needed?: No  Information entered by :: Lacretia Nicks CMA   Activities of Daily Living In your present state of health, do you have any difficulty performing the following activities: 09/22/2020  Hearing? N  Comment patient does feel like hearing has decreased some  Vision? N  Difficulty concentrating or making decisions?  N  Walking or climbing stairs? N  Dressing or bathing? N  Doing errands, shopping? N  Preparing Food and eating ? N  Using the Toilet? N  In the past six months, have you accidently leaked urine? N  Do you have problems with loss of bowel control? N  Managing your Medications? N  Managing your Finances? N  Housekeeping or managing your Housekeeping? N  Some recent data might be hidden    Patient Care Team: [name], [name], Harrell Gave, [name] Rico Junker, RN as Registered Nurse Lloyd Huger, MD as Consulting Physician (Oncology) Bary Castilla Forest Gleason, MD as Consulting Physician (General Surgery) Bary Castilla Forest Gleason, MD as Consulting Physician (General Surgery) Noreene Filbert, MD as Referring Physician (Radiation Oncology) Jeral Fruit, RN as Registered Nurse  Indicate any recent Medical Services you may have received from other than [facility] providers in the past year (date may be approximate).     Assessment:   This is a routine wellness examination for Alicia [name].  Hearing/Vision screen No results found.  Dietary issues and exercise activities discussed:     Goals Addressed   None    Depression Screen PHQ 2/9 Scores 09/22/2020 11/03/2019  PHQ - 2 Score 0 0    Fall Risk Fall Risk  09/22/2020  06/14/2020 11/03/2019 03/04/2018 01/30/2018  Falls in the past year? 0 0 0 0 0  Number falls in past yr: 0 0 0 - -  Injury with Fall? 0 0 0 - -  Risk for fall due to : No Fall Risks No Fall Risks - - -  Follow up Falls evaluation completed - - - -    FALL RISK PREVENTION PERTAINING TO THE HOME:  Any stairs in or around the home? No  If so, are there any without handrails? No  Home free of loose throw rugs in walkways, pet beds, electrical cords, etc? Yes  Adequate lighting in your home to reduce risk of falls? Yes   ASSISTIVE DEVICES UTILIZED TO PREVENT FALLS:  Life alert? No  Use of a cane, walker or w/c? No  Grab bars in the bathroom? No  Shower chair or bench in shower? Yes  Elevated toilet seat or a handicapped toilet? No   TIMED UP AND GO:  Was the test performed? No .  Length of time to ambulate 10 feet: na   Gait steady and fast without use of assistive device  Cognitive Function: MMSE - Mini Mental State Exam 09/22/2020  Orientation to time 5  Orientation to Place 5  Registration 3  Attention/ Calculation 5  Recall 3  Language- name 2 objects 2  Language- repeat 1  Language- follow 3 step command 3  Language- read & follow direction 1  Write a sentence 1  Copy design 1  Total score 30     6CIT Screen 09/22/2020  What Year? 0 points  What month? 0 points  What time? 0 points  Count back from 20 0 points  Months in reverse 0 points  Repeat phrase 0 points  Total Score 0    Immunizations Immunization History  Administered Date(s) Administered   Influenza, High Dose Seasonal PF 10/31/2017   PFIZER(Purple Top)SARS-COV-2 Vaccination 05/12/2019, 06/02/2019   Tdap 09/14/2010    TDAP status: Up to date  Flu Vaccine status: Up to date  Pneumococcal vaccine status: Up to date  Covid-19 vaccine status: Completed vaccines  Qualifies for Shingles Vaccine?  Yes   Zostavax completed No   Shingrix Completed?: No.    Education has been provided regarding the  importance of this vaccine. Patient has been advised to call insurance company to determine out of pocket expense if they have not yet received this vaccine. Advised may also receive vaccine at local pharmacy or Health Dept. Verbalized acceptance and understanding.  Screening Tests Health Maintenance  Topic Date Due   Hepatitis C Screening  Never done   Zoster Vaccines- Shingrix (1 of 2) Never done   COLONOSCOPY (Pts 45-98yr Insurance coverage will need to be confirmed)  Never done   PNA vac Low Risk Adult (1 of 2 - PCV13) Never done   COVID-19 Vaccine (3 - Pfizer risk series) 06/30/2019   INFLUENZA VACCINE  08/15/2020   TETANUS/TDAP  09/13/2020   MAMMOGRAM  09/09/2022   DEXA SCAN  Completed   HPV VACCINES  Aged Out    Health Maintenance  Health Maintenance Due  Topic Date Due   Hepatitis C Screening  Never done   Zoster Vaccines- Shingrix (1 of 2) Never done   COLONOSCOPY (Pts 45-467yrInsurance coverage will need to be confirmed)  Never done   PNA vac Low Risk Adult (1 of 2 - PCV13) Never done   COVID-19 Vaccine (3 - Pfizer risk series) 06/30/2019   INFLUENZA VACCINE  08/15/2020   TETANUS/TDAP  09/13/2020    Colorectal cancer screening: Referral to GI placed Patient wants to hold off on the referral and will call when ready. Pt aware the office will call re: appt.  Mammogram status: Completed 09/08/2020. Repeat every year  Bone Density status: Ordered  . Pt provided with contact info and advised to call to schedule appt.  Lung Cancer Screening: (Low Dose CT Chest recommended if Age 71-80ears, 30 pack-year currently smoking OR have quit w/in 15years.) does not qualify.   Lung Cancer Screening Referral: na  Additional Screening:  Hepatitis C Screening: does not qualify;   Vision Screening: Recommended annual ophthalmology exams for early detection of glaucoma and other disorders of the eye. Is the patient up to date with their annual eye exam?  No  Who is the provider  or what is the name of the office in which the patient attends annual eye exams? Patient will call when ready for referral  If pt is not established with a provider, would they like to be referred to a provider to establish care? Yes .   Dental Screening: Recommended annual dental exams for proper oral hygiene  Community Resource Referral / Chronic Care Management: CRR required this visit?  No   CCM required this visit?  No      Plan:     I have personally reviewed and noted the following in the patient's chart:   Medical and social history Use of alcohol, tobacco or illicit drugs  Current medications and supplements including opioid prescriptions.  Functional ability and status Nutritional status Physical activity Advanced directives List of other physicians Hospitalizations, surgeries, and ER visits in previous 12 months Vitals Screenings to include cognitive, depression, and falls Referrals and appointments  In addition, I have reviewed and discussed with patient certain preventive protocols, quality metrics, and best practice recommendations. A written personalized care plan for preventive services as well as general preventive health recommendations were provided to patient.     AsLacretia NicksCM[place] 09/22/2020   Nurse Notes:  Ms. HoMunsch Thank you for taking time to come for your Medicare  Wellness Visit. I appreciate your ongoing commitment to your health goals. Please review the following plan we discussed and let me know if I can assist you in the future.   These are the goals we discussed:  Goals   None     This is a list of the screening recommended for you and due dates:  Health Maintenance  Topic Date Due   Hepatitis C Screening: USPSTF Recommendation to screen - Ages 72-79 yo.  Never done   Zoster (Shingles) Vaccine (1 of 2) Never done   Colon Cancer Screening  Never done   Pneumonia vaccines (1 of 2 - PCV13) Never done   COVID-19 Vaccine (3 -  Pfizer risk series) 06/30/2019   Flu Shot  08/15/2020   Tetanus Vaccine  09/13/2020   Mammogram  09/09/2022   DEXA scan (bone density measurement)  Completed   HPV Vaccine  Aged Out      Time spent with patient 35 min

## 2020-09-23 NOTE — Progress Notes (Signed)
I have reviewed this visit and agree with the documentation.   

## 2020-09-26 ENCOUNTER — Other Ambulatory Visit (HOSPITAL_COMMUNITY): Payer: Self-pay

## 2020-10-03 ENCOUNTER — Other Ambulatory Visit: Payer: Self-pay | Admitting: *Deleted

## 2020-10-03 MED ORDER — AMLODIPINE BESYLATE 5 MG PO TABS: 5.0000 mg | ORAL_TABLET | Freq: Every day | ORAL | 3 refills | Status: DC

## 2020-10-10 ENCOUNTER — Other Ambulatory Visit: Payer: Self-pay

## 2020-10-10 ENCOUNTER — Encounter: Payer: Self-pay | Admitting: Internal Medicine

## 2020-10-10 ENCOUNTER — Ambulatory Visit (INDEPENDENT_AMBULATORY_CARE_PROVIDER_SITE_OTHER): Payer: Medicare Other | Admitting: Internal Medicine

## 2020-10-10 VITALS — BP 101/62 | HR 83 | Ht 66.0 in | Wt 225.6 lb

## 2020-10-10 DIAGNOSIS — F419 Anxiety disorder, unspecified: Secondary | ICD-10-CM | POA: Diagnosis not present

## 2020-10-10 DIAGNOSIS — Z23 Encounter for immunization: Secondary | ICD-10-CM | POA: Diagnosis not present

## 2020-10-10 DIAGNOSIS — E669 Obesity, unspecified: Secondary | ICD-10-CM | POA: Diagnosis not present

## 2020-10-10 DIAGNOSIS — E7800 Pure hypercholesterolemia, unspecified: Secondary | ICD-10-CM

## 2020-10-10 DIAGNOSIS — I10 Essential (primary) hypertension: Secondary | ICD-10-CM | POA: Diagnosis not present

## 2020-10-10 DIAGNOSIS — R000 Tachycardia, unspecified: Secondary | ICD-10-CM | POA: Diagnosis not present

## 2020-10-10 MED ORDER — ALPRAZOLAM 0.5 MG PO TABS: 0.5000 mg | ORAL_TABLET | Freq: Two times a day (BID) | ORAL | 0 refills | Status: DC

## 2020-10-10 NOTE — Assessment & Plan Note (Signed)
Hypercholesterolemia  I advised the patient to follow Mediterranean diet This diet is rich in fruits vegetables and whole grain, and This diet is also rich in fish and lean meat Patient should also eat a handful of almonds or walnuts daily Recent heart study indicated that average follow-up on this kind of diet reduces the cardiovascular mortality by 50 to 70%== 

## 2020-10-10 NOTE — Assessment & Plan Note (Signed)
Blood pressure is low We will stop the Cozaar

## 2020-10-10 NOTE — Assessment & Plan Note (Signed)
-   I encouraged the patient to lose weight.  - I educated them on making healthy dietary choices including eating more fruits and vegetables and less fried foods. - I encouraged the patient to exercise more, and educated on the benefits of exercise including weight loss, diabetes prevention, and hypertension prevention.   Dietary counseling with a registered dietician  Referral to a weight management support group (e.g. Weight Watchers, Overeaters Anonymous)  If your BMI is greater than 29 or you have gained more than 15 pounds you should work on weight loss.  Attend a healthy cooking class  

## 2020-10-10 NOTE — Assessment & Plan Note (Signed)
Resolved

## 2020-10-10 NOTE — Progress Notes (Signed)
Established Patient Office Visit  Subjective:  Patient ID: Alicia [name], female    DOB: [birth year]  Age: 70 y.o. MRN: [id number]  CC:  Chief Complaint  Patient presents with   Hypertension    Hypertension   Alicia [name] presents for general check up  Past Medical History:  Diagnosis Date   Anxiety    Breast cancer, left ([place]) 07/2017   Depression    Hypertension    Hypothyroidism    Personal history of chemotherapy 2019   LEFT mastectomy-chemo before   Personal history of radiation therapy 03/2018   LEFT mastectomy   Rapid heart rate    Thyroid disease     Past Surgical History:  Procedure Laterality Date   AXILLARY LYMPH NODE BIOPSY Left 07/16/2017   METASTATIC MAMMARY CARCINOMA   BREAST BIOPSY Left 07/16/2017   [place] bx of left breast mass and left breast LN.  INVASIVE MAMMARY CARCINOMA, NO SPECIAL TYPE.    BREAST EXCISIONAL BIOPSY Right 2001   benign   BREAST LUMPECTOMY WITH SENTINEL LYMPH NODE BIOPSY Left 12/06/2017   Procedure: BREAST LUMPECTOMY WITH SENTINEL LYMPH NODE BX;  Surgeon: Robert Bellow, MD;  Location: [facility] ORS;  Service: General;  Laterality: Left;   COLONOSCOPY     MASTECTOMY Left 12/23/2017   PORTACATH PLACEMENT Right 08/07/2017   Procedure: INSERTION PORT-A-CATH;  Surgeon: Robert Bellow, MD;  Location: [facility] OR[facility];  Service: General;  Laterality: Right;   SIMPLE MASTECTOMY WITH AXILLARY SENTINEL NODE BIOPSY Left 12/23/2017   T2,N2 with 6/7 nodes positive. Whole breast radiation.  Surgeon: Robert Bellow, MD;  Location: [facility] OR[facility];  Service: General;  Laterality: Left;   TONSILLECTOMY      Family History  Problem Relation Age of Onset   Stroke Mother    Thyroid disease Mother    Renal Disease Mother    Stroke Father    Heart attack Father    Sudden death Father 9       suicide   Anuerysm Brother    Breast cancer Neg Hx     Social History   Socioeconomic History   Marital status: Widowed    Spouse name: Not on file    Number of children: Not on file   Years of education: Not on file   Highest education level: Not on file  Occupational History   Not on file  Tobacco Use   Smoking status: Former    Packs/day: 1.00    Years: 30.00    Pack years: 30.00    Types: Cigarettes    Quit date: 2019    Years since quitting: 3.7   Smokeless tobacco: Never  Vaping Use   Vaping Use: Never used  Substance and Sexual Activity   Alcohol use: Not Currently   Drug use: Never   Sexual activity: Not Currently  Other Topics Concern   Not on file  Social History Narrative   Not on file   Social Determinants of Health   Financial Resource Strain: Low Risk    Difficulty of Paying Living Expenses: Not very hard  Food Insecurity: Food Insecurity Present   Worried About Running Out of Food in the Last Year: Sometimes true   Ran Out of Food in the Last Year: Never true  Transportation Needs: No Transportation Needs   Lack of Transportation (Medical): No   Lack of Transportation (Non-Medical): No  Physical Activity: Insufficiently Active   Days of Exercise per Week: 2 days   Minutes of  Exercise per Session: 20 min  Stress: No Stress Concern Present   Feeling of Stress : Not at all  Social Connections: Socially Isolated   Frequency of Communication with Friends and Family: Twice a week   Frequency of Social Gatherings with Friends and Family: Twice a week   Attends Religious Services: Never   Marine scientist or Organizations: No   Attends Archivist Meetings: Never   Marital Status: Widowed  Human resources officer Violence: Not At Risk   Fear of Current or Ex-Partner: No   Emotionally Abused: No   Physically Abused: No   Sexually Abused: No     Current Outpatient Medications:    acetaminophen (TYLENOL) 500 MG tablet, Take 1,000 mg by mouth every 6 (six) hours as needed for moderate pain or headache. , Disp: , Rfl:    amLODipine (NORVASC) 5 MG tablet, Take 1 tablet (5 mg total) by mouth  daily., Disp: 90 tablet, Rfl: 3   aspirin EC 81 MG tablet, Take 81 mg by mouth at bedtime. , Disp: , Rfl:    calcium carbonate (OS-CAL - DOSED IN MG OF ELEMENTAL CALCIUM) 1250 (500 Ca) MG tablet, Take 1 tablet by mouth daily with breakfast., Disp: , Rfl:    Cholecalciferol (VITAMIN D3) 125 MCG (5000 UT) CAPS, Take 1 capsule by mouth 2 (two) times a day., Disp: , Rfl:    docusate sodium (COLACE) 100 MG capsule, Take 100 mg by mouth 3 (three) times daily as needed for mild constipation., Disp: , Rfl:    hydrochlorothiazide (HYDRODIURIL) 12.5 MG tablet, Take 1 tablet (12.5 mg total) by mouth daily., Disp: 90 tablet, Rfl: 3   levothyroxine (SYNTHROID) 100 MCG tablet, TAKE 1 TABLET BY MOUTH DAILY, Disp: 90 tablet, Rfl: 3   nystatin-lidocaine-prednisoLONE-diphenhydrAMINE-distilled water-alum & mag hydroxide-simeth, Swish and spit 5-56ml four times a day as needed, Disp: 480 mL, Rfl: 3   ondansetron (ZOFRAN) 8 MG tablet, Take 1 tablet (8 mg total) by mouth every 8 (eight) hours as needed for nausea or vomiting., Disp: 20 tablet, Rfl: 0   palbociclib (IBRANCE) 125 MG tablet, Take 1 tablet (125 mg total) by mouth daily. Take for 21 days, then hold for 7 days. Repeat every 28 days., Disp: 21 tablet, Rfl: 3   rosuvastatin (CRESTOR) 5 MG tablet, TAKE 1 TABLET BY MOUTH DAILY, Disp: 30 tablet, Rfl: 6   traZODone (DESYREL) 50 MG tablet, TAKE 1 TABLET BY MOUTH DAILY, Disp: 30 tablet, Rfl: 6   ALPRAZolam (XANAX) 0.5 MG tablet, Take 1 tablet (0.5 mg total) by mouth 2 (two) times daily., Disp: 60 tablet, Rfl: 0   metoprolol tartrate (LOPRESSOR) 25 MG tablet, Take 1 tablet (25 mg total) by mouth once for 1 dose. Take 2 hours prior to CTA., Disp: 1 tablet, Rfl: 0   Allergies  Allergen Reactions   Sulfa Antibiotics Diarrhea    ROS Review of Systems  Constitutional: Negative.   HENT: Negative.    Eyes: Negative.   Respiratory: Negative.    Cardiovascular: Negative.   Gastrointestinal: Negative.   Skin:  Negative.   Allergic/Immunologic: Negative.   Neurological: Negative.   Hematological: Negative.   Psychiatric/Behavioral: Negative.    All other systems reviewed and are negative.    Objective:    Physical Exam Vitals reviewed.  Constitutional:      Appearance: Normal appearance.  HENT:     Mouth/Throat:     Mouth: Mucous membranes are moist.  Eyes:     Pupils: Pupils are  equal, round, and reactive to light.  Neck:     Vascular: No carotid bruit.  Cardiovascular:     Rate and Rhythm: Normal rate and regular rhythm.     Pulses: Normal pulses.     Heart sounds: Normal heart sounds.  Pulmonary:     Effort: Pulmonary effort is normal.     Breath sounds: Normal breath sounds.  Chest:     Comments: Left mastectomy Abdominal:     General: Bowel sounds are normal.     Palpations: Abdomen is soft. There is no hepatomegaly, splenomegaly or mass.     Tenderness: There is no abdominal tenderness.     Hernia: No hernia is present.  Musculoskeletal:        General: No tenderness.     Cervical back: Neck supple.     Right lower leg: No edema.     Left lower leg: No edema.  Skin:    Findings: No rash.  Neurological:     Mental Status: She is alert and oriented to person, place, and time.     Motor: No weakness.  Psychiatric:        Mood and Affect: Mood and affect normal.        Behavior: Behavior normal.    BP 101/62   Pulse 83   Ht 5\' 6"  (1.676 m)   Wt 225 lb 9.6 oz (102.3 kg)   BMI 36.41 kg/m  Wt Readings from Last 3 Encounters:  10/10/20 225 lb 9.6 oz (102.3 kg)  08/09/20 224 lb (101.6 kg)  07/14/20 223 lb 9.6 oz (101.4 kg)     Health Maintenance Due  Topic Date Due   Hepatitis C Screening  Never done   Zoster Vaccines- Shingrix (1 of 2) Never done   COLONOSCOPY (Pts 45-54yrs Insurance coverage will need to be confirmed)  Never done   COVID-19 Vaccine (3 - Pfizer risk series) 06/30/2019   INFLUENZA VACCINE  08/15/2020   TETANUS/TDAP  09/13/2020    There  are no preventive care reminders to display for this patient.  Lab Results  Component Value Date   TSH 0.925 10/27/2019   Lab Results  Component Value Date   WBC 2.1 (L) 09/16/2020   HGB 10.5 (L) 09/16/2020   HCT 30.7 (L) 09/16/2020   MCV 104.8 (H) 09/16/2020   PLT 232 09/16/2020   Lab Results  Component Value Date   NA 134 (L) 09/16/2020   K 3.7 09/16/2020   CO2 26 09/16/2020   GLUCOSE 99 09/16/2020   BUN 24 (H) 09/16/2020   CREATININE 1.59 (H) 09/16/2020   BILITOT 1.0 09/16/2020   ALKPHOS 71 09/16/2020   AST 20 09/16/2020   ALT 17 09/16/2020   PROT 7.5 09/16/2020   ALBUMIN 4.2 09/16/2020   CALCIUM 9.7 09/16/2020   ANIONGAP 10 09/16/2020   Lab Results  Component Value Date   CHOL 202 (H) 12/15/2019   Lab Results  Component Value Date   HDL 104 12/15/2019   Lab Results  Component Value Date   LDLCALC 87 12/15/2019   Lab Results  Component Value Date   TRIG 60 12/15/2019   Lab Results  Component Value Date   CHOLHDL 1.9 12/15/2019   Lab Results  Component Value Date   HGBA1C 5.9 (H) 10/04/2017      Assessment & Plan:   Problem List Items Addressed This Visit       Cardiovascular and Mediastinum   Primary hypertension    Blood pressure is  low We will stop the Cozaar        Other   Tachycardia    Resolved      Anxiety    - Patient experiencing high levels of anxiety.  - Encouraged patient to engage in relaxing activities like yoga, meditation, journaling, going for a walk, or participating in a hobby.  - Encouraged patient to reach out to trusted friends or family members about recent struggles, Patient was advised to read A book, how to stop worrying and start living, it is good book to read to control  the stress       Relevant Medications   ALPRAZolam (XANAX) 0.5 MG tablet   Obesity (BMI 35.0-39.9 without comorbidity) - Primary    - I encouraged the patient to lose weight.  - I educated them on making healthy dietary choices  including eating more fruits and vegetables and less fried foods. - I encouraged the patient to exercise more, and educated on the benefits of exercise including weight loss, diabetes prevention, and hypertension prevention.   Dietary counseling with a registered dietician  Referral to a weight management support group (e.g. Weight Watchers, Overeaters Anonymous)  If your BMI is greater than 29 or you have gained more than 15 pounds you should work on weight loss.  Attend a healthy cooking class       Pure hypercholesterolemia    Hypercholesterolemia  I advised the patient to follow Mediterranean diet This diet is rich in fruits vegetables and whole grain, and This diet is also rich in fish and lean meat Patient should also eat a handful of almonds or walnuts daily Recent heart study indicated that average follow-up on this kind of diet reduces the cardiovascular mortality by 50 to 70%==      Other Visit Diagnoses     Need for influenza vaccination           Meds ordered this encounter  Medications   ALPRAZolam (XANAX) 0.5 MG tablet    Sig: Take 1 tablet (0.5 mg total) by mouth 2 (two) times daily.    Dispense:  60 tablet    Refill:  0    Follow-up: No follow-ups on file.    [name], MD

## 2020-10-10 NOTE — Assessment & Plan Note (Signed)
-   Patient experiencing high levels of anxiety.  - Encouraged patient to engage in relaxing activities like yoga, meditation, journaling, going for a walk, or participating in a hobby.  - Encouraged patient to reach out to trusted friends or family members about recent struggles, Patient was advised to read A book, how to stop worrying and start living, it is good book to read to control  the stress  

## 2020-10-17 NOTE — Progress Notes (Signed)
[name]  [phone] [phone]  ID: Alicia [name] OB: Nov 23, 1950  MR#: [id number]  YOV#:[id number]  Patient Care Team: [name], [name], Harrell Gave, [name] Rico Junker, RN as Registered Nurse Lloyd Huger, MD as Consulting Physician (Oncology) Bary Castilla Forest Gleason, MD as Consulting Physician (General Surgery) Bary Castilla Forest Gleason, MD as Consulting Physician (General Surgery) Noreene Filbert, MD as Referring Physician (Radiation Oncology) Jeral Fruit, RN as Registered Nurse   CHIEF COMPLAINT: Recurrent stage IV ER/PR positive, 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  INTERVAL HISTORY: Patient returns to clinic today for repeat laboratory work, further evaluation, and continuation of treatment with [name], Faslodex, and Xgeva.  She continues to tolerate her treatments well without significant side effects. She does not complain of weakness or fatigue today. She does not complain of pain today.  She continues to have a mild peripheral neuropathy, but no other neurologic complaints.  She denies any chest pain, shortness of breath, cough, or hemoptysis.  She denies any nausea, vomiting, constipation, or diarrhea.  She has no urinary complaints.  Patient offers no further specific complaints today.  REVIEW OF SYSTEMS:   Review of Systems  Constitutional: Negative.  Negative for fever, malaise/fatigue and weight loss.  HENT:  Negative for congestion.   Respiratory: Negative.  Negative for cough and shortness of breath.   Cardiovascular: Negative.  Negative for chest pain and leg swelling.  Gastrointestinal: Negative.  Negative for abdominal pain, constipation, diarrhea and nausea.  Genitourinary: Negative.  Negative for dysuria and urgency.  Musculoskeletal: Negative.  Negative for back pain and joint pain.  Skin: Negative.  Negative for rash.   Neurological:  Positive for tingling and sensory change. Negative for dizziness, focal weakness, weakness and headaches.  Psychiatric/Behavioral: Negative.  The patient is not nervous/anxious.    As per HPI. Otherwise, a complete review of systems is negative.  PAST MEDICAL HISTORY: Past Medical History:  Diagnosis Date   Anxiety    Breast cancer, left ([place]) 07/2017   Depression    Hypertension    Hypothyroidism    Personal history of chemotherapy 2019   LEFT mastectomy-chemo before   Personal history of radiation therapy 03/2018   LEFT mastectomy   Rapid heart rate    Thyroid disease     PAST SURGICAL HISTORY: Past Surgical History:  Procedure Laterality Date   AXILLARY LYMPH NODE BIOPSY Left 07/16/2017   METASTATIC MAMMARY CARCINOMA   BREAST BIOPSY Left 07/16/2017   [place] bx of left breast mass and left breast LN.  INVASIVE MAMMARY CARCINOMA, NO SPECIAL TYPE.    BREAST EXCISIONAL BIOPSY Right 2001   benign   BREAST LUMPECTOMY WITH SENTINEL LYMPH NODE BIOPSY Left 12/06/2017   Procedure: BREAST LUMPECTOMY WITH SENTINEL LYMPH NODE BX;  Surgeon: Robert Bellow, MD;  Location: [facility] OR[facility];  Service: General;  Laterality: Left;   COLONOSCOPY     MASTECTOMY Left 12/23/2017   PORTACATH PLACEMENT Right 08/07/2017   Procedure: INSERTION PORT-A-CATH;  Surgeon: Robert Bellow, MD;  Location: [facility];  Service: General;  Laterality: Right;   SIMPLE MASTECTOMY WITH AXILLARY SENTINEL NODE BIOPSY Left 12/23/2017   T2,N2 with 6/7 nodes positive. Whole breast radiation.  Surgeon: Robert Bellow, MD;  Location: [facility] OR[facility];  Service: General;  Laterality: Left;   TONSILLECTOMY      FAMILY HISTORY: Family History  Problem Relation Age of Onset   Stroke Mother  Thyroid disease Mother    Renal Disease Mother    Stroke Father    Heart attack Father    Sudden death Father 35       suicide   Anuerysm Brother    Breast cancer Neg Hx     ADVANCED DIRECTIVES (Y/N):  N  HEALTH  MAINTENANCE: Social History   Tobacco Use   Smoking status: Former    Packs/day: 1.00    Years: 30.00    Pack years: 30.00    Types: Cigarettes    Quit date: 2019    Years since quitting: 3.7   Smokeless tobacco: Never  Vaping Use   Vaping Use: Never used  Substance Use Topics   Alcohol use: Not Currently   Drug use: Never     Colonoscopy:  PAP:  Bone density:  Lipid panel:  Allergies  Allergen Reactions   Sulfa Antibiotics Diarrhea    Current Outpatient Medications  Medication Sig Dispense Refill   acetaminophen (TYLENOL) 500 MG tablet Take 1,000 mg by mouth every 6 (six) hours as needed for moderate pain or headache.      ALPRAZolam (XANAX) 0.5 MG tablet Take 1 tablet (0.5 mg total) by mouth 2 (two) times daily. 60 tablet 0   amLODipine (NORVASC) 5 MG tablet Take 1 tablet (5 mg total) by mouth daily. 90 tablet 3   aspirin EC 81 MG tablet Take 81 mg by mouth at bedtime.      calcium carbonate (OS-CAL - DOSED IN MG OF ELEMENTAL CALCIUM) 1250 (500 Ca) MG tablet Take 1 tablet by mouth daily with breakfast.     Cholecalciferol (VITAMIN D3) 125 MCG (5000 UT) CAPS Take 1 capsule by mouth 2 (two) times a day.     docusate sodium (COLACE) 100 MG capsule Take 100 mg by mouth 3 (three) times daily as needed for mild constipation.     hydrochlorothiazide (HYDRODIURIL) 12.5 MG tablet Take 1 tablet (12.5 mg total) by mouth daily. 90 tablet 3   levothyroxine (SYNTHROID) 100 MCG tablet TAKE 1 TABLET BY MOUTH DAILY 90 tablet 3   nystatin-lidocaine-prednisoLONE-diphenhydrAMINE-distilled water-alum & mag hydroxide-simeth Swish and spit 5-31m four times a day as needed 480 mL 3   ondansetron (ZOFRAN) 8 MG tablet Take 1 tablet (8 mg total) by mouth every 8 (eight) hours as needed for nausea or vomiting. 20 tablet 0   palbociclib (IBRANCE) 125 MG tablet Take 1 tablet (125 mg total) by mouth daily. Take for 21 days, then hold for 7 days. Repeat every 28 days. 21 tablet 3   rosuvastatin  (CRESTOR) 5 MG tablet TAKE 1 TABLET BY MOUTH DAILY 30 tablet 6   traZODone (DESYREL) 50 MG tablet TAKE 1 TABLET BY MOUTH DAILY 30 tablet 6   metoprolol tartrate (LOPRESSOR) 25 MG tablet Take 1 tablet (25 mg total) by mouth once for 1 dose. Take 2 hours prior to CTA. (Patient not taking: Reported on 10/18/2020) 1 tablet 0   No current facility-administered medications for this visit.    OBJECTIVE: Vitals:   10/18/20 1404  BP: 139/75  Pulse: 73  Resp: 16  Temp: (!) 96.4 F (35.8 C)     Body mass index is 36.53 kg/m.    ECOG FS:0 - Asymptomatic  General: Well-developed, well-nourished, no acute distress. Eyes: Pink conjunctiva, anicteric sclera. HEENT: Normocephalic, moist mucous membranes. Lungs: No audible wheezing or coughing. Heart: Regular rate and rhythm. Abdomen: Soft, nontender, no obvious distention. Musculoskeletal: No edema, cyanosis, or clubbing. Neuro: Alert, answering  all questions appropriately. Cranial nerves grossly intact. Skin: No rashes or petechiae noted. Psych: Normal affect.  LAB RESULTS:  Lab Results  Component Value Date   NA 134 (L) 10/18/2020   K 3.6 10/18/2020   CL 98 10/18/2020   CO2 27 10/18/2020   GLUCOSE 99 10/18/2020   BUN 25 (H) 10/18/2020   CREATININE 1.39 (H) 10/18/2020   CALCIUM 9.1 10/18/2020   PROT 7.7 10/18/2020   ALBUMIN 4.2 10/18/2020   AST 22 10/18/2020   ALT 16 10/18/2020   ALKPHOS 70 10/18/2020   BILITOT 0.9 10/18/2020   GFRNONAA 41 (L) 10/18/2020   GFRAA 57 (L) 10/01/2019    Lab Results  Component Value Date   WBC 1.7 (L) 10/18/2020   NEUTROABS 1.2 (L) 10/18/2020   HGB 10.8 (L) 10/18/2020   HCT 31.5 (L) 10/18/2020   MCV 105.7 (H) 10/18/2020   PLT 165 10/18/2020     STUDIES: No results found. ONCOLOGY HISTORY: Patient initially received neoadjuvant chemotherapy with Adriamycin and Cytoxan followed by weekly Taxol. She only received 4 cycles of weekly Taxol prior to discontinuation of treatment on October 31, 2017  secondary to persistent peripheral neuropathy.  She ultimately required mastectomy and final pathology noted 5 of 6 lymph nodes positive for disease.  Patient completed adjuvant XRT in mid April 2020.  Pain initiated letrozole in May 2020, this was discontinued secondary to side effects and patient was started on anastrozole.  Nuclear medicine bone scan on June 19, 2019 revealed metastatic disease and anastrozole was subsequently discontinued.  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  PLAN:    1. 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  MRI of the brain on August 24, 2019 reviewed independently with no obvious evidence of metastatic disease.  Patient's CA 27-29 initially increased to 141.9, but appears to have plateaued since November 2021 between 46.9 and 60.4.  Today's result is 56.6.  Despite persistent, mild neutropenia, will continue 125 mg Ibrance for 21 days with 7 days off.  Proceed with Faslodex and Xgeva today.  Patient only requires Xgeva every 3 months at this point.  Patient has not had imaging in over 1 year, therefore will repeat PET scan in the next several weeks to assess for interval change.  Patient will return to clinic in 4 weeks for repeat laboratory work, further evaluation, and continuation of Faslodex.  2.  Peripheral neuropathy: Chronic and unchanged. 3.  Pulmonary embolus: Patient was diagnosed with a small pulmonary embolus on January 10, 2018.  She is no longer on anticoagulation.  There was no evidence of recurrent PE on CT scan from October 27, 2019. 4.  Osteopenia: Patient's most recent bone mineral density on March 03, 2019 reported a T score of -1.8 which is only mildly decreased from 1 year prior when the reported T score was  -1.6.  Continue calcium and vitamin D supplementation.  Xgeva as above. 5.  Left hip pain: Patient does not complain of this today.  Continue symptomatic treatment.  Patient has had XRT to this area. 6.  Leukopenia: Chronic and unchanged.  Patient's total white blood cell count is 1.7 today.  She is entering her week off of Ibrance in the next several days.  Monitor. 7.  Anemia: Chronic and unchanged.  Patient's hemoglobin is 10.8 today.  8.  Renal insufficiency: Mildly improved.  Patient's creatinine is 1.39.  Patient expressed understanding and was in agreement with this plan. She also understands that She can call clinic at any time with any questions, concerns, or complaints.   Cancer Staging Breast cancer, stage 2, left ([place]) Staging form: Breast, AJCC 8th Edition - Clinical stage from 07/30/2017: Stage IIA (cT2, cN1, cM0, G2, ER+, PR+, HER2-) - Signed by Lloyd Huger, MD on 07/30/2017 Histologic grading system: 3 grade system Laterality: Left   Lloyd Huger, MD   10/19/2020 12:08 PM

## 2020-10-18 ENCOUNTER — Other Ambulatory Visit (HOSPITAL_COMMUNITY): Payer: Self-pay

## 2020-10-18 ENCOUNTER — Inpatient Hospital Stay (HOSPITAL_BASED_OUTPATIENT_CLINIC_OR_DEPARTMENT_OTHER): Payer: Medicare Other | Admitting: Oncology

## 2020-10-18 ENCOUNTER — Inpatient Hospital Stay: Payer: Medicare Other

## 2020-10-18 ENCOUNTER — Inpatient Hospital Stay: Payer: Medicare Other | Attending: Oncology

## 2020-10-18 ENCOUNTER — Inpatient Hospital Stay: Payer: Medicare Other | Admitting: Pharmacist

## 2020-10-18 ENCOUNTER — Encounter: Payer: Self-pay | Admitting: Oncology

## 2020-10-18 VITALS — BP 139/75 | HR 73 | Temp 96.4°F | Resp 16 | Wt 226.3 lb

## 2020-10-18 DIAGNOSIS — C50919 Malignant neoplasm of unspecified site of unspecified female breast: Secondary | ICD-10-CM

## 2020-10-18 DIAGNOSIS — Z5111 Encounter for antineoplastic chemotherapy: Secondary | ICD-10-CM | POA: Diagnosis not present

## 2020-10-18 DIAGNOSIS — Z79899 Other long term (current) drug therapy: Secondary | ICD-10-CM | POA: Insufficient documentation

## 2020-10-18 DIAGNOSIS — C7951 Secondary malignant neoplasm of bone: Secondary | ICD-10-CM | POA: Insufficient documentation

## 2020-10-18 DIAGNOSIS — C50912 Malignant neoplasm of unspecified site of left female breast: Secondary | ICD-10-CM

## 2020-10-18 LAB — CBC WITH DIFFERENTIAL/PLATELET
Abs Immature Granulocytes: 0.01 10*3/uL (ref 0.00–0.07)
Basophils Absolute: 0 10*3/uL (ref 0.0–0.1)
Basophils Relative: 1 %
Eosinophils Absolute: 0 10*3/uL (ref 0.0–0.5)
Eosinophils Relative: 2 %
HCT: 31.5 % — ABNORMAL LOW (ref 36.0–46.0)
Hemoglobin: 10.8 g/dL — ABNORMAL LOW (ref 12.0–15.0)
Immature Granulocytes: 1 %
Lymphocytes Relative: 18 %
Lymphs Abs: 0.3 10*3/uL — ABNORMAL LOW (ref 0.7–4.0)
MCH: 36.2 pg — ABNORMAL HIGH (ref 26.0–34.0)
MCHC: 34.3 g/dL (ref 30.0–36.0)
MCV: 105.7 fL — ABNORMAL HIGH (ref 80.0–100.0)
Monocytes Absolute: 0.2 10*3/uL (ref 0.1–1.0)
Monocytes Relative: 10 %
Neutro Abs: 1.2 10*3/uL — ABNORMAL LOW (ref 1.7–7.7)
Neutrophils Relative %: 68 %
Platelets: 165 10*3/uL (ref 150–400)
RBC: 2.98 MIL/uL — ABNORMAL LOW (ref 3.87–5.11)
RDW: 14.4 % (ref 11.5–15.5)
WBC: 1.7 10*3/uL — ABNORMAL LOW (ref 4.0–10.5)
nRBC: 0 % (ref 0.0–0.2)

## 2020-10-18 LAB — COMPREHENSIVE METABOLIC PANEL
ALT: 16 U/L (ref 0–44)
AST: 22 U/L (ref 15–41)
Albumin: 4.2 g/dL (ref 3.5–5.0)
Alkaline Phosphatase: 70 U/L (ref 38–126)
Anion gap: 9 (ref 5–15)
BUN: 25 mg/dL — ABNORMAL HIGH (ref 8–23)
CO2: 27 mmol/L (ref 22–32)
Calcium: 9.1 mg/dL (ref 8.9–10.3)
Chloride: 98 mmol/L (ref 98–111)
Creatinine, Ser: 1.39 mg/dL — ABNORMAL HIGH (ref 0.44–1.00)
GFR, Estimated: 41 mL/min — ABNORMAL LOW (ref >60)
Glucose, Bld: 99 mg/dL (ref 70–99)
Potassium: 3.6 mmol/L (ref 3.5–5.1)
Sodium: 134 mmol/L — ABNORMAL LOW (ref 135–145)
Total Bilirubin: 0.9 mg/dL (ref 0.3–1.2)
Total Protein: 7.7 g/dL (ref 6.5–8.1)

## 2020-10-18 MED ORDER — FULVESTRANT 250 MG/5ML IM SOLN
500.0000 mg | Freq: Once | INTRAMUSCULAR | Status: AC
  Administered 2020-10-18: 500 mg via INTRAMUSCULAR
  Filled 2020-10-18: qty 10

## 2020-10-18 MED ORDER — DENOSUMAB 120 MG/1.7ML ~~LOC~~ SOLN
120.0000 mg | Freq: Once | SUBCUTANEOUS | Status: AC
  Administered 2020-10-18: 120 mg via SUBCUTANEOUS
  Filled 2020-10-18: qty 1.7

## 2020-10-18 NOTE — Progress Notes (Signed)
[place]  [phone] [phone]  Patient Care Team: [name], [name], Harrell Gave, [name] Rico Junker, RN as Registered Nurse Grayland Ormond Kathlene November, MD as Consulting Physician (Oncology) Bary Castilla Forest Gleason, MD as Consulting Physician (General Surgery) Bary Castilla Forest Gleason, MD as Consulting Physician (General Surgery) Noreene Filbert, MD as Referring Physician (Radiation Oncology) Jeral Fruit, RN as Registered Nurse   Name of the patient: Alicia [name]  [id number]  1950-04-22   Date of visit: 10/18/20  HPI: Patient is a 70 y.o. female with recurrent stage IV ER/PR positive, HER2 negative breast cancer. Patient started on Ibrance on 07/09/19.  Reason for Consult: Oral chemotherapy follow-up for Ibrance (palbociclib) therapy.   PAST MEDICAL HISTORY: Past Medical History:  Diagnosis Date   Anxiety    Breast cancer, left ([place]) 07/2017   Depression    Hypertension    Hypothyroidism    Personal history of chemotherapy 2019   LEFT mastectomy-chemo before   Personal history of radiation therapy 03/2018   LEFT mastectomy   Rapid heart rate    Thyroid disease     PAST SURGICAL HISTORY:  Past Surgical History:  Procedure Laterality Date   AXILLARY LYMPH NODE BIOPSY Left 07/16/2017   METASTATIC MAMMARY CARCINOMA   BREAST BIOPSY Left 07/16/2017   [place] bx of left breast mass and left breast LN.  INVASIVE MAMMARY CARCINOMA, NO SPECIAL TYPE.    BREAST EXCISIONAL BIOPSY Right 2001   benign   BREAST LUMPECTOMY WITH SENTINEL LYMPH NODE BIOPSY Left 12/06/2017   Procedure: BREAST LUMPECTOMY WITH SENTINEL LYMPH NODE BX;  Surgeon: Robert Bellow, MD;  Location: [facility] ORS;  Service: General;  Laterality: Left;   COLONOSCOPY     MASTECTOMY Left 12/23/2017   PORTACATH PLACEMENT Right 08/07/2017   Procedure: INSERTION PORT-A-CATH;  Surgeon:  Robert Bellow, MD;  Location: [facility] ORS;  Service: General;  Laterality: Right;   SIMPLE MASTECTOMY WITH AXILLARY SENTINEL NODE BIOPSY Left 12/23/2017   T2,N2 with 6/7 nodes positive. Whole breast radiation.  Surgeon: Robert Bellow, MD;  Location: [facility] ORS;  Service: General;  Laterality: Left;   TONSILLECTOMY      HEMATOLOGY/ONCOLOGY HISTORY:  Oncology History Overview Note  Patient is a 70 year old female who recently self palpated a mass on her left breast.  Subsequent imaging and biopsy revealed the above-stated breast cancer.  Case was also discussed extensively at case conference.  Given the size and the stage of patient's malignancy, she will benefit from neoadjuvant chemotherapy using Adriamycin, Cytoxan, and Taxol.  Patient will also require Neulasta support.  Will get CT scan of the chest, abdomen, and pelvis to assess for any metastatic disease.  Patient will also require port placement and MUGA prior to initiating treatment  CT abdomen/pelvis/chest did not reveal any suspicious lesions concerning for metastatic disease. (08/01/17)  Port-A-Cath placed on 08/07/2017.  Cycle 1 day 1 of AC was given on 08/08/17.     Breast cancer, stage 2, left ([place])  07/24/2017 Initial Diagnosis   Breast cancer, stage 2, left ([place])   07/30/2017 Cancer Staging   Staging form: Breast, AJCC 8th Edition - Clinical stage from 07/30/2017: Stage IIA (cT2, cN1, cM0, G2, ER+, PR+, HER2-) - Signed by Lloyd Huger, MD on 07/30/2017   08/08/2017 - 10/31/2017 Chemotherapy   The patient had DOXOrubicin (ADRIAMYCIN) chemo injection 130 mg, 60 mg/m2 = 130 mg, Intravenous,  Once, 4  of 4 cycles Administration: 130 mg (08/08/2017), 130 mg (08/22/2017), 130 mg (09/05/2017), 130 mg (09/19/2017) palonosetron (ALOXI) injection 0.25 mg, 0.25 mg, Intravenous,  Once, 4 of 4 cycles Administration: 0.25 mg (08/08/2017), 0.25 mg (08/22/2017), 0.25 mg (09/05/2017), 0.25 mg (09/19/2017) pegfilgrastim-cbqv (UDENYCA) injection 6  mg, 6 mg, Subcutaneous, Once, 4 of 4 cycles Administration: 6 mg (08/09/2017), 6 mg (08/23/2017), 6 mg (09/06/2017), 6 mg (09/20/2017) cyclophosphamide (CYTOXAN) 1,300 mg in sodium chloride 0.9 % 250 mL chemo infusion, 600 mg/m2 = 1,300 mg, Intravenous,  Once, 4 of 4 cycles Administration: 1,300 mg (08/08/2017), 1,300 mg (08/22/2017), 1,300 mg (09/05/2017), 1,300 mg (09/19/2017) PACLitaxel (TAXOL) 174 mg in sodium chloride 0.9 % 250 mL chemo infusion (</= 78m/m2), 80 mg/m2 = 174 mg, Intravenous,  Once, 4 of 12 cycles Dose modification: 72 mg/m2 (original dose 80 mg/m2, Cycle 6, Reason: Dose not tolerated) Administration: 174 mg (10/03/2017), 156 mg (10/17/2017), 156 mg (10/24/2017), 156 mg (10/31/2017) fosaprepitant (EMEND) 150 mg, dexamethasone (DECADRON) 12 mg in sodium chloride 0.9 % 145 mL IVPB, , Intravenous,  Once, 4 of 4 cycles Administration:  (08/08/2017),  (08/22/2017),  (09/05/2017),  (09/19/2017)   for chemotherapy treatment.       ALLERGIES:  is allergic to sulfa antibiotics.  MEDICATIONS:  Current Outpatient Medications  Medication Sig Dispense Refill   acetaminophen (TYLENOL) 500 MG tablet Take 1,000 mg by mouth every 6 (six) hours as needed for moderate pain or headache.      ALPRAZolam (XANAX) 0.5 MG tablet Take 1 tablet (0.5 mg total) by mouth 2 (two) times daily. 60 tablet 0   amLODipine (NORVASC) 5 MG tablet Take 1 tablet (5 mg total) by mouth daily. 90 tablet 3   aspirin EC 81 MG tablet Take 81 mg by mouth at bedtime.      calcium carbonate (OS-CAL - DOSED IN MG OF ELEMENTAL CALCIUM) 1250 (500 Ca) MG tablet Take 1 tablet by mouth daily with breakfast.     Cholecalciferol (VITAMIN D3) 125 MCG (5000 UT) CAPS Take 1 capsule by mouth 2 (two) times a day.     docusate sodium (COLACE) 100 MG capsule Take 100 mg by mouth 3 (three) times daily as needed for mild constipation.     hydrochlorothiazide (HYDRODIURIL) 12.5 MG tablet Take 1 tablet (12.5 mg total) by mouth daily. 90 tablet 3    levothyroxine (SYNTHROID) 100 MCG tablet TAKE 1 TABLET BY MOUTH DAILY 90 tablet 3   metoprolol tartrate (LOPRESSOR) 25 MG tablet Take 1 tablet (25 mg total) by mouth once for 1 dose. Take 2 hours prior to CTA. 1 tablet 0   nystatin-lidocaine-prednisoLONE-diphenhydrAMINE-distilled water-alum & mag hydroxide-simeth Swish and spit 5-174mfour times a day as needed 480 mL 3   ondansetron (ZOFRAN) 8 MG tablet Take 1 tablet (8 mg total) by mouth every 8 (eight) hours as needed for nausea or vomiting. 20 tablet 0   palbociclib (IBRANCE) 125 MG tablet Take 1 tablet (125 mg total) by mouth daily. Take for 21 days, then hold for 7 days. Repeat every 28 days. 21 tablet 3   rosuvastatin (CRESTOR) 5 MG tablet TAKE 1 TABLET BY MOUTH DAILY 30 tablet 6   traZODone (DESYREL) 50 MG tablet TAKE 1 TABLET BY MOUTH DAILY 30 tablet 6   No current facility-administered medications for this visit.    VITAL SIGNS: There were no vitals taken for this visit. There were no vitals filed for this visit.  Estimated body mass index is 36.41 kg/m as calculated from the  following:   Height as of 10/10/20: _0  (1.676 m).   Weight as of 10/10/20: 102.3 kg (225 lb 9.6 oz).  LABS: CBC:    Component Value Date/Time   WBC 2.1 (L) 09/16/2020 1241   HGB 10.5 (L) 09/16/2020 1241   HGB 14.2 02/11/2011 1349   HCT 30.7 (L) 09/16/2020 1241   HCT 40.9 02/11/2011 1349   PLT 232 09/16/2020 1241   PLT 151 02/11/2011 1349   MCV 104.8 (H) 09/16/2020 1241   MCV 89 02/11/2011 1349   NEUTROABS 1.4 (L) 09/16/2020 1241   LYMPHSABS 0.4 (L) 09/16/2020 1241   MONOABS 0.2 09/16/2020 1241   EOSABS 0.0 09/16/2020 1241   BASOSABS 0.0 09/16/2020 1241   Comprehensive Metabolic Panel:    Component Value Date/Time   NA 134 (L) 09/16/2020 1241   NA 142 02/11/2011 1349   K 3.7 09/16/2020 1241   K 3.8 02/11/2011 1349   CL 98 09/16/2020 1241   CL 107 02/11/2011 1349   CO2 26 09/16/2020 1241   CO2 24 02/11/2011 1349   BUN 24 (H) 09/16/2020  1241   BUN 14 02/11/2011 1349   CREATININE 1.59 (H) 09/16/2020 1241   CREATININE 0.65 02/11/2011 1349   GLUCOSE 99 09/16/2020 1241   GLUCOSE 98 02/11/2011 1349   CALCIUM 9.7 09/16/2020 1241   CALCIUM 9.0 02/11/2011 1349   AST 20 09/16/2020 1241   AST 27 02/11/2011 1349   ALT 17 09/16/2020 1241   ALT 36 02/11/2011 1349   ALKPHOS 71 09/16/2020 1241   ALKPHOS 122 02/11/2011 1349   BILITOT 1.0 09/16/2020 1241   BILITOT 0.5 02/11/2011 1349   PROT 7.5 09/16/2020 1241   PROT 7.0 02/11/2011 1349   ALBUMIN 4.2 09/16/2020 1241   ALBUMIN 4.1 02/11/2011 1349    RADIOGRAPHIC STUDIES: No results found.   Assessment and Plan-  Reviewed CBC and CMP, ANC decreased but no palbociclib dose adjustment indicated and ALT/AST/T.bili within normal limits. CA 27.29 pending today, but the last checks have trended up slightly.   Dr. Grayland Ormond planning to repeat imaging prior to next office visit.   Continue palbociclib 140m daily for 21 days on and 7 days off   Oral Chemotherapy Side Effect/Intolerance:  Fatigue: recently improved with a decrease in the dose of her blood pressure medications by her PCP  No reported constipation or diarrhea, nausea, or mouth sores  Oral Chemotherapy Adherence: No reported missed doses  Medication Access Issues: no issues, [facility] during office visit today, expected delivery 10/25/20  Patient expressed understanding and was in agreement with this plan. She also understands that She can call clinic at any time with any questions, concerns, or complaints.   Thank you for allowing me to participate in the care of this very pleasant patient.   Time Total: 15 mins  Visit consisted of counseling and education on dealing with issues of symptom management in the setting of serious and potentially life-threatening illness.Greater than 50%  of this time was spent counseling and coordinating care related to the above  assessment and plan.  Signed by: ADarl Pikes PharmD, BCPS, BSalley Slaughter CPP Hematology/Oncology Clinical Pharmacist Practitioner [phone] 10/18/2020 1:11 PM

## 2020-10-18 NOTE — Progress Notes (Signed)
Patient denies new problems/concerns today.   °

## 2020-10-19 ENCOUNTER — Encounter: Payer: Self-pay | Admitting: Oncology

## 2020-10-19 LAB — CANCER ANTIGEN 27.29: CA 27.29: 56.6 U/mL — ABNORMAL HIGH (ref 0.0–38.6)

## 2020-10-24 ENCOUNTER — Other Ambulatory Visit (HOSPITAL_COMMUNITY): Payer: Self-pay

## 2020-10-25 ENCOUNTER — Other Ambulatory Visit: Payer: Self-pay

## 2020-10-25 ENCOUNTER — Encounter: Payer: Self-pay | Admitting: Internal Medicine

## 2020-10-25 ENCOUNTER — Ambulatory Visit (INDEPENDENT_AMBULATORY_CARE_PROVIDER_SITE_OTHER): Payer: Medicare Other | Admitting: Internal Medicine

## 2020-10-25 VITALS — BP 120/78 | HR 77 | Ht 66.0 in | Wt 225.6 lb

## 2020-10-25 DIAGNOSIS — E7800 Pure hypercholesterolemia, unspecified: Secondary | ICD-10-CM

## 2020-10-25 DIAGNOSIS — C50919 Malignant neoplasm of unspecified site of unspecified female breast: Secondary | ICD-10-CM

## 2020-10-25 DIAGNOSIS — C50912 Malignant neoplasm of unspecified site of left female breast: Secondary | ICD-10-CM

## 2020-10-25 DIAGNOSIS — I10 Essential (primary) hypertension: Secondary | ICD-10-CM

## 2020-10-25 DIAGNOSIS — E669 Obesity, unspecified: Secondary | ICD-10-CM | POA: Diagnosis not present

## 2020-10-25 DIAGNOSIS — C7951 Secondary malignant neoplasm of bone: Secondary | ICD-10-CM | POA: Diagnosis not present

## 2020-10-25 DIAGNOSIS — K219 Gastro-esophageal reflux disease without esophagitis: Secondary | ICD-10-CM

## 2020-10-25 NOTE — Assessment & Plan Note (Signed)

## 2020-10-25 NOTE — Assessment & Plan Note (Signed)
Patient educated extensively on acid reflux lifestyle modification, including buying a bed wedge, not eating 3 hrs before bedtime, diet modifications, and handout given for the same.  

## 2020-10-25 NOTE — Assessment & Plan Note (Signed)
Patient was scheduled to have a PET scan of the chest

## 2020-10-25 NOTE — Assessment & Plan Note (Signed)
   Use a measuring cup to measure serving sizes. You could also try weighing out portions on a kitchen scale. With time, you will be able to estimate serving sizes for some foods.  Take time to put servings of different foods on your favorite plates or in your favorite bowls and cups so you know what a serving looks like.  Try not to eat straight from a food's packaging, such as from a bag or box. Eating straight from the package makes it hard to see how much you are eating and can lead to overeating. Put the amount you would like to eat in a cup or on a plate to make sure you are eating the right portion.  Use smaller plates, glasses, and bowls for smaller portions and to prevent overeating.  Try not to multitask. For example, avoid watching TV or using your computer while eating. If it is time to eat, sit down at a table and enjoy your food. This will help you recognize when you are full. It will also help you be more mindful of what and how much you are eating. What are tips for following this plan? Reading food labels  Check the calorie count compared with the serving size. The serving size may be smaller than what you are used to eating.  Check the source of the calories. Try to choose foods that are high in protein, fiber, and vitamins, and low in saturated fat, trans fat, and sodium. Shopping  Read nutrition labels while you shop. This will help you make healthy decisions about which foods to buy.  Pay attention to nutrition labels for low-fat or fat-free foods. These foods sometimes have the same number of calories or more calories than the full-fat versions. They also often have added sugar, starch, or salt to make up for flavor that was removed with the fat.  Make a grocery list of lower-calorie foods and stick to it. Cooking  Try to cook your favorite foods in a healthier way. For example, try baking instead of frying.  Use low-fat dairy products. Meal planning  Use more  fruits and vegetables. One-half of your plate should be fruits and vegetables.  Include lean proteins, such as chicken, [place], and fish. Lifestyle Each week, aim to do one of the following:  150 minutes of moderate exercise, such as walking.  75 minutes of vigorous exercise, such as running. General information  Know how many calories are in the foods you eat most often. This will help you calculate calorie counts faster.  Find a way of tracking calories that works for you. Get creative. Try different apps or programs if writing down calories does not work for you.

## 2020-10-25 NOTE — Progress Notes (Signed)
Established Patient Office Visit  Subjective:  Patient ID: Alicia [name], female    DOB: 1950-02-18  Age: 70 y.o. MRN: [id number]  CC:  Chief Complaint  Patient presents with   Follow-up    2 week follow up for low blood pressure     HPI  Alicia [name] presents for check up  Past Medical History:  Diagnosis Date   Anxiety    Breast cancer, left ([place]) 07/2017   Depression    Hypertension    Hypothyroidism    Personal history of chemotherapy 2019   LEFT mastectomy-chemo before   Personal history of radiation therapy 03/2018   LEFT mastectomy   Rapid heart rate    Thyroid disease     Past Surgical History:  Procedure Laterality Date   AXILLARY LYMPH NODE BIOPSY Left 07/16/2017   METASTATIC MAMMARY CARCINOMA   BREAST BIOPSY Left 07/16/2017   [place] bx of left breast mass and left breast LN.  INVASIVE MAMMARY CARCINOMA, NO SPECIAL TYPE.    BREAST EXCISIONAL BIOPSY Right 2001   benign   BREAST LUMPECTOMY WITH SENTINEL LYMPH NODE BIOPSY Left 12/06/2017   Procedure: BREAST LUMPECTOMY WITH SENTINEL LYMPH NODE BX;  Surgeon: Robert Bellow, MD;  Location: [facility] OR[facility];  Service: General;  Laterality: Left;   COLONOSCOPY     MASTECTOMY Left 12/23/2017   PORTACATH PLACEMENT Right 08/07/2017   Procedure: INSERTION PORT-A-CATH;  Surgeon: Robert Bellow, MD;  Location: [facility];  Service: General;  Laterality: Right;   SIMPLE MASTECTOMY WITH AXILLARY SENTINEL NODE BIOPSY Left 12/23/2017   T2,N2 with 6/7 nodes positive. Whole breast radiation.  Surgeon: Robert Bellow, MD;  Location: [facility];  Service: General;  Laterality: Left;   TONSILLECTOMY      Family History  Problem Relation Age of Onset   Stroke Mother    Thyroid disease Mother    Renal Disease Mother    Stroke Father    Heart attack Father    Sudden death Father 64       suicide   Anuerysm Brother    Breast cancer Neg Hx     Social History   Socioeconomic History   Marital status: Widowed     Spouse name: Not on file   Number of children: Not on file   Years of education: Not on file   Highest education level: Not on file  Occupational History   Not on file  Tobacco Use   Smoking status: Former    Packs/day: 1.00    Years: 30.00    Pack years: 30.00    Types: Cigarettes    Quit date: 2019    Years since quitting: 3.7   Smokeless tobacco: Never  Vaping Use   Vaping Use: Never used  Substance and Sexual Activity   Alcohol use: Not Currently   Drug use: Never   Sexual activity: Not Currently  Other Topics Concern   Not on file  Social History Narrative   Not on file   Social Determinants of Health   Financial Resource Strain: Low Risk    Difficulty of Paying Living Expenses: Not very hard  Food Insecurity: Food Insecurity Present   Worried About Running Out of Food in the Last Year: Sometimes true   Ran Out of Food in the Last Year: Never true  Transportation Needs: No Transportation Needs   Lack of Transportation (Medical): No   Lack of Transportation (Non-Medical): No  Physical Activity: Insufficiently Active  Days of Exercise per Week: 2 days   Minutes of Exercise per Session: 20 min  Stress: No Stress Concern Present   Feeling of Stress : Not at all  Social Connections: Socially Isolated   Frequency of Communication with Friends and Family: Twice a week   Frequency of Social Gatherings with Friends and Family: Twice a week   Attends Religious Services: Never   Marine scientist or Organizations: No   Attends Archivist Meetings: Never   Marital Status: Widowed  Human resources officer Violence: Not At Risk   Fear of Current or Ex-Partner: No   Emotionally Abused: No   Physically Abused: No   Sexually Abused: No     Current Outpatient Medications:    acetaminophen (TYLENOL) 500 MG tablet, Take 1,000 mg by mouth every 6 (six) hours as needed for moderate pain or headache. , Disp: , Rfl:    ALPRAZolam (XANAX) 0.5 MG tablet, Take 1 tablet  (0.5 mg total) by mouth 2 (two) times daily., Disp: 60 tablet, Rfl: 0   amLODipine (NORVASC) 5 MG tablet, Take 1 tablet (5 mg total) by mouth daily., Disp: 90 tablet, Rfl: 3   aspirin EC 81 MG tablet, Take 81 mg by mouth at bedtime. , Disp: , Rfl:    calcium carbonate (OS-CAL - DOSED IN MG OF ELEMENTAL CALCIUM) 1250 (500 Ca) MG tablet, Take 1 tablet by mouth daily with breakfast., Disp: , Rfl:    Cholecalciferol (VITAMIN D3) 125 MCG (5000 UT) CAPS, Take 1 capsule by mouth 2 (two) times a day., Disp: , Rfl:    docusate sodium (COLACE) 100 MG capsule, Take 100 mg by mouth 3 (three) times daily as needed for mild constipation., Disp: , Rfl:    hydrochlorothiazide (HYDRODIURIL) 12.5 MG tablet, Take 1 tablet (12.5 mg total) by mouth daily., Disp: 90 tablet, Rfl: 3   levothyroxine (SYNTHROID) 100 MCG tablet, TAKE 1 TABLET BY MOUTH DAILY, Disp: 90 tablet, Rfl: 3   nystatin-lidocaine-prednisoLONE-diphenhydrAMINE-distilled water-alum & mag hydroxide-simeth, Swish and spit 5-55ml four times a day as needed, Disp: 480 mL, Rfl: 3   ondansetron (ZOFRAN) 8 MG tablet, Take 1 tablet (8 mg total) by mouth every 8 (eight) hours as needed for nausea or vomiting., Disp: 20 tablet, Rfl: 0   palbociclib (IBRANCE) 125 MG tablet, Take 1 tablet (125 mg total) by mouth daily. Take for 21 days, then hold for 7 days. Repeat every 28 days., Disp: 21 tablet, Rfl: 3   rosuvastatin (CRESTOR) 5 MG tablet, TAKE 1 TABLET BY MOUTH DAILY, Disp: 30 tablet, Rfl: 6   traZODone (DESYREL) 50 MG tablet, TAKE 1 TABLET BY MOUTH DAILY, Disp: 30 tablet, Rfl: 6   Allergies  Allergen Reactions   Sulfa Antibiotics Diarrhea    ROS Review of Systems    Objective:    Physical Exam  BP 120/78   Pulse 77   Ht 5\' 6"  (1.676 m)   Wt 225 lb 9.6 oz (102.3 kg)   BMI 36.41 kg/m  Wt Readings from Last 3 Encounters:  10/25/20 225 lb 9.6 oz (102.3 kg)  10/18/20 226 lb 4.8 oz (102.6 kg)  10/10/20 225 lb 9.6 oz (102.3 kg)     Health  Maintenance Due  Topic Date Due   Hepatitis C Screening  Never done   Zoster Vaccines- Shingrix (1 of 2) Never done   COLONOSCOPY (Pts 45-76yrs Insurance coverage will need to be confirmed)  Never done   COVID-19 Vaccine (3 - Pfizer risk series)  06/30/2019   TETANUS/TDAP  09/13/2020    There are no preventive care reminders to display for this patient.  Lab Results  Component Value Date   TSH 0.925 10/27/2019   Lab Results  Component Value Date   WBC 1.7 (L) 10/18/2020   HGB 10.8 (L) 10/18/2020   HCT 31.5 (L) 10/18/2020   MCV 105.7 (H) 10/18/2020   PLT 165 10/18/2020   Lab Results  Component Value Date   NA 134 (L) 10/18/2020   K 3.6 10/18/2020   CO2 27 10/18/2020   GLUCOSE 99 10/18/2020   BUN 25 (H) 10/18/2020   CREATININE 1.39 (H) 10/18/2020   BILITOT 0.9 10/18/2020   ALKPHOS 70 10/18/2020   AST 22 10/18/2020   ALT 16 10/18/2020   PROT 7.7 10/18/2020   ALBUMIN 4.2 10/18/2020   CALCIUM 9.1 10/18/2020   ANIONGAP 9 10/18/2020   Lab Results  Component Value Date   CHOL 202 (H) 12/15/2019   Lab Results  Component Value Date   HDL 104 12/15/2019   Lab Results  Component Value Date   LDLCALC 87 12/15/2019   Lab Results  Component Value Date   TRIG 60 12/15/2019   Lab Results  Component Value Date   CHOLHDL 1.9 12/15/2019   Lab Results  Component Value Date   HGBA1C 5.9 (H) 10/04/2017      Assessment & Plan:   Problem List Items Addressed This Visit       Cardiovascular and Mediastinum   Primary hypertension - Primary     Patient denies any chest pain or shortness of breath there is no history of palpitation or paroxysmal nocturnal dyspnea   patient was advised to follow low-salt low-cholesterol diet    ideally I want to keep systolic blood pressure below 130 mmHg, patient was asked to check blood pressure one times a week and give me a report on that.  Patient will be follow-up in 3 months  or earlier as needed, patient will call me back for any  change in the cardiovascular symptoms Patient was advised to buy a book from local bookstore concerning blood pressure and read several chapters  every day.  This will be supplemented by some of the material we will give him from the office.  Patient should also utilize other resources like YouTube and Internet to learn more about the blood pressure and the diet.        Digestive   Gastroesophageal reflux disease without esophagitis    Patient educated extensively on acid reflux lifestyle modification, including buying a bed wedge, not eating 3 hrs before bedtime, diet modifications, and handout given for the same.         Musculoskeletal and Integument   Carcinoma of breast metastatic to bone [facility])    Patient was scheduled to have a PET scan of the chest        Other   Breast cancer, stage 2, left ([place])    Refer to oncology      Obesity (BMI 35.0-39.9 without comorbidity)        Use a measuring cup to measure serving sizes. You could also try weighing out portions on a kitchen scale. With time, you will be able to estimate serving sizes for some foods.  Take time to put servings of different foods on your favorite plates or in your favorite bowls and cups so you know what a serving looks like.  Try not to eat straight from a food's packaging, such as  from a bag or box. Eating straight from the package makes it hard to see how much you are eating and can lead to overeating. Put the amount you would like to eat in a cup or on a plate to make sure you are eating the right portion.  Use smaller plates, glasses, and bowls for smaller portions and to prevent overeating.  Try not to multitask. For example, avoid watching TV or using your computer while eating. If it is time to eat, sit down at a table and enjoy your food. This will help you recognize when you are full. It will also help you be more mindful of what and how much you are eating. What are tips for following this  plan? Reading food labels  Check the calorie count compared with the serving size. The serving size may be smaller than what you are used to eating.  Check the source of the calories. Try to choose foods that are high in protein, fiber, and vitamins, and low in saturated fat, trans fat, and sodium. Shopping  Read nutrition labels while you shop. This will help you make healthy decisions about which foods to buy.  Pay attention to nutrition labels for low-fat or fat-free foods. These foods sometimes have the same number of calories or more calories than the full-fat versions. They also often have added sugar, starch, or salt to make up for flavor that was removed with the fat.  Make a grocery list of lower-calorie foods and stick to it. Cooking  Try to cook your favorite foods in a healthier way. For example, try baking instead of frying.  Use low-fat dairy products. Meal planning  Use more fruits and vegetables. One-half of your plate should be fruits and vegetables.  Include lean proteins, such as chicken, [place], and fish. Lifestyle Each week, aim to do one of the following:  150 minutes of moderate exercise, such as walking.  75 minutes of vigorous exercise, such as running. General information  Know how many calories are in the foods you eat most often. This will help you calculate calorie counts faster.  Find a way of tracking calories that works for you. Get creative. Try different apps or programs if writing down calories does not work for you.      Pure hypercholesterolemia    Hypercholesterolemia  I advised the patient to follow Mediterranean diet This diet is rich in fruits vegetables and whole grain, and This diet is also rich in fish and lean meat Patient should also eat a handful of almonds or walnuts daily Recent heart study indicated that average follow-up on this kind of diet reduces the cardiovascular mortality by 50 to 70%==       No orders of the  defined types were placed in this encounter.   Follow-up: No follow-ups on file.    [name], MD

## 2020-10-25 NOTE — Assessment & Plan Note (Signed)
Refer to oncology

## 2020-10-25 NOTE — Assessment & Plan Note (Signed)
Hypercholesterolemia  I advised the patient to follow Mediterranean diet This diet is rich in fruits vegetables and whole grain, and This diet is also rich in fish and lean meat Patient should also eat a handful of almonds or walnuts daily Recent heart study indicated that average follow-up on this kind of diet reduces the cardiovascular mortality by 50 to 70%== 

## 2020-10-27 ENCOUNTER — Telehealth: Payer: Self-pay | Admitting: Oncology

## 2020-10-27 NOTE — Telephone Encounter (Signed)
Telephone conversation with patient based upon referral from clinical team.  Discussed one-time $1000 [place] and qualifications to assist with personal expenses while undergoing treatment.  Patient approved for [place] funding. Provided patient my contact information for any additional questions.

## 2020-11-10 ENCOUNTER — Other Ambulatory Visit: Payer: Self-pay

## 2020-11-10 ENCOUNTER — Ambulatory Visit
Admission: RE | Admit: 2020-11-10 | Discharge: 2020-11-10 | Disposition: A | Discharge status: Home / Self Care | Payer: Medicare Other | Source: Ambulatory Visit | Attending: Oncology | Admitting: Oncology

## 2020-11-10 DIAGNOSIS — K5730 Diverticulosis of large intestine without perforation or abscess without bleeding: Secondary | ICD-10-CM | POA: Diagnosis not present

## 2020-11-10 DIAGNOSIS — C50912 Malignant neoplasm of unspecified site of left female breast: Secondary | ICD-10-CM | POA: Diagnosis not present

## 2020-11-10 DIAGNOSIS — C7951 Secondary malignant neoplasm of bone: Secondary | ICD-10-CM | POA: Insufficient documentation

## 2020-11-10 DIAGNOSIS — J8410 Pulmonary fibrosis, unspecified: Secondary | ICD-10-CM | POA: Diagnosis not present

## 2020-11-10 DIAGNOSIS — C50919 Malignant neoplasm of unspecified site of unspecified female breast: Secondary | ICD-10-CM

## 2020-11-10 LAB — GLUCOSE, CAPILLARY: Glucose-Capillary: 96 mg/dL (ref 70–99)

## 2020-11-10 MED ORDER — FLUDEOXYGLUCOSE F - 18 (FDG) INJECTION
11.7000 | Freq: Once | INTRAVENOUS | Status: AC | PRN
  Administered 2020-11-10: 12.71 via INTRAVENOUS

## 2020-11-10 NOTE — Progress Notes (Signed)
[place]  [phone] [phone]) [phone]  ID: Alicia [name] OB: 1950-06-24  MR#: [id number]  OEH#:[id number]  Patient Care Team: [name], [name], Harrell Gave, [name] Rico Junker, RN as Registered Nurse Lloyd Huger, MD as Consulting Physician (Oncology) Bary Castilla Forest Gleason, MD as Consulting Physician (General Surgery) Bary Castilla Forest Gleason, MD as Consulting Physician (General Surgery) Noreene Filbert, MD as Referring Physician (Radiation Oncology) Jeral Fruit, RN as Registered Nurse   CHIEF COMPLAINT: Recurrent stage IV ER/PR positive, 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  INTERVAL HISTORY: Patient returns to clinic today for further evaluation, discussion of her imaging results, and continuation of treatment.  She is mildly anxious, but otherwise feels well.  She continues to tolerate her treatments without significant side effects.  She does not complain of weakness or fatigue today. She does not complain of pain today.  She continues to have a mild peripheral neuropathy, but no other neurologic complaints.  She denies any chest pain, shortness of breath, cough, or hemoptysis.  She denies any nausea, vomiting, constipation, or diarrhea.  She has no urinary complaints.  Patient offers no further specific complaints today.  REVIEW OF SYSTEMS:   Review of Systems  Constitutional: Negative.  Negative for fever, malaise/fatigue and weight loss.  HENT:  Negative for congestion.   Respiratory: Negative.  Negative for cough and shortness of breath.   Cardiovascular: Negative.  Negative for chest pain and leg swelling.  Gastrointestinal: Negative.  Negative for abdominal pain, constipation, diarrhea and nausea.  Genitourinary: Negative.  Negative for dysuria and urgency.  Musculoskeletal: Negative.  Negative for back pain and joint pain.  Skin:  Negative.  Negative for rash.  Neurological:  Positive for tingling and sensory change. Negative for dizziness, focal weakness, weakness and headaches.  Psychiatric/Behavioral:  The patient is nervous/anxious.    As per HPI. Otherwise, a complete review of systems is negative.  PAST MEDICAL HISTORY: Past Medical History:  Diagnosis Date   Anxiety    Breast cancer, left ([place]) 07/2017   Depression    Hypertension    Hypothyroidism    Personal history of chemotherapy 2019   LEFT mastectomy-chemo before   Personal history of radiation therapy 03/2018   LEFT mastectomy   Rapid heart rate    Thyroid disease     PAST SURGICAL HISTORY: Past Surgical History:  Procedure Laterality Date   AXILLARY LYMPH NODE BIOPSY Left 07/16/2017   METASTATIC MAMMARY CARCINOMA   BREAST BIOPSY Left 07/16/2017   [place] bx of left breast mass and left breast LN.  INVASIVE MAMMARY CARCINOMA, NO SPECIAL TYPE.    BREAST EXCISIONAL BIOPSY Right 2001   benign   BREAST LUMPECTOMY WITH SENTINEL LYMPH NODE BIOPSY Left 12/06/2017   Procedure: BREAST LUMPECTOMY WITH SENTINEL LYMPH NODE BX;  Surgeon: Robert Bellow, MD;  Location: [facility] ORS;  Service: General;  Laterality: Left;   COLONOSCOPY     MASTECTOMY Left 12/23/2017   PORTACATH PLACEMENT Right 08/07/2017   Procedure: INSERTION PORT-A-CATH;  Surgeon: Robert Bellow, MD;  Location: [facility] OR[facility];  Service: General;  Laterality: Right;   SIMPLE MASTECTOMY WITH AXILLARY SENTINEL NODE BIOPSY Left 12/23/2017   T2,N2 with 6/7 nodes positive. Whole breast radiation.  Surgeon: Robert Bellow, MD;  Location: [facility] OR[facility];  Service: General;  Laterality: Left;   TONSILLECTOMY      FAMILY HISTORY: Family History  Problem Relation Age of Onset  Stroke Mother    Thyroid disease Mother    Renal Disease Mother    Stroke Father    Heart attack Father    Sudden death Father 37       suicide   Anuerysm Brother    Breast cancer Neg Hx     ADVANCED DIRECTIVES (Y/N):   N  HEALTH MAINTENANCE: Social History   Tobacco Use   Smoking status: Former    Packs/day: 1.00    Years: 30.00    Pack years: 30.00    Types: Cigarettes    Quit date: 2019    Years since quitting: 3.8   Smokeless tobacco: Never  Vaping Use   Vaping Use: Never used  Substance Use Topics   Alcohol use: Not Currently   Drug use: Never     Colonoscopy:  PAP:  Bone density:  Lipid panel:  Allergies  Allergen Reactions   Sulfa Antibiotics Diarrhea    Current Outpatient Medications  Medication Sig Dispense Refill   acetaminophen (TYLENOL) 500 MG tablet Take 1,000 mg by mouth every 6 (six) hours as needed for moderate pain or headache.      ALPRAZolam (XANAX) 0.5 MG tablet Take 1 tablet (0.5 mg total) by mouth 2 (two) times daily. 60 tablet 0   amLODipine (NORVASC) 5 MG tablet Take 1 tablet (5 mg total) by mouth daily. 90 tablet 3   aspirin EC 81 MG tablet Take 81 mg by mouth at bedtime.      calcium carbonate (OS-CAL - DOSED IN MG OF ELEMENTAL CALCIUM) 1250 (500 Ca) MG tablet Take 1 tablet by mouth daily with breakfast.     Cholecalciferol (VITAMIN D3) 125 MCG (5000 UT) CAPS Take 1 capsule by mouth 2 (two) times a day.     docusate sodium (COLACE) 100 MG capsule Take 100 mg by mouth 3 (three) times daily as needed for mild constipation.     hydrochlorothiazide (HYDRODIURIL) 12.5 MG tablet Take 1 tablet (12.5 mg total) by mouth daily. 90 tablet 3   levothyroxine (SYNTHROID) 100 MCG tablet TAKE 1 TABLET BY MOUTH DAILY 90 tablet 3   nystatin-lidocaine-prednisoLONE-diphenhydrAMINE-distilled water-alum & mag hydroxide-simeth Swish and spit 5-29m four times a day as needed 480 mL 3   ondansetron (ZOFRAN) 8 MG tablet Take 1 tablet (8 mg total) by mouth every 8 (eight) hours as needed for nausea or vomiting. 20 tablet 0   palbociclib (IBRANCE) 125 MG tablet Take 1 tablet (125 mg total) by mouth daily. Take for 21 days, then hold for 7 days. Repeat every 28 days. 21 tablet 3    rosuvastatin (CRESTOR) 5 MG tablet TAKE 1 TABLET BY MOUTH DAILY 30 tablet 6   traZODone (DESYREL) 50 MG tablet TAKE 1 TABLET BY MOUTH DAILY 30 tablet 6   No current facility-administered medications for this visit.    OBJECTIVE: Vitals:   11/15/20 1433  BP: 130/77  Pulse: 79  Resp: 18  Temp: 98 F (36.7 C)     Body mass index is 35.99 kg/m.    ECOG FS:0 - Asymptomatic  General: Well-developed, well-nourished, no acute distress. Eyes: Pink conjunctiva, anicteric sclera. HEENT: Normocephalic, moist mucous membranes. Lungs: No audible wheezing or coughing. Heart: Regular rate and rhythm. Abdomen: Soft, nontender, no obvious distention. Musculoskeletal: No edema, cyanosis, or clubbing. Neuro: Alert, answering all questions appropriately. Cranial nerves grossly intact. Skin: No rashes or petechiae noted. Psych: Normal affect.  LAB RESULTS:  Lab Results  Component Value Date   NA 137 11/15/2020  K 3.4 (L) 11/15/2020   CL 96 (L) 11/15/2020   CO2 26 11/15/2020   GLUCOSE 113 (H) 11/15/2020   BUN 22 11/15/2020   CREATININE 1.43 (H) 11/15/2020   CALCIUM 9.6 11/15/2020   PROT 7.8 11/15/2020   ALBUMIN 4.4 11/15/2020   AST 23 11/15/2020   ALT 16 11/15/2020   ALKPHOS 78 11/15/2020   BILITOT 0.8 11/15/2020   GFRNONAA 39 (L) 11/15/2020   GFRAA 57 (L) 10/01/2019    Lab Results  Component Value Date   WBC 2.1 (L) 11/15/2020   NEUTROABS 1.5 (L) 11/15/2020   HGB 10.4 (L) 11/15/2020   HCT 30.7 (L) 11/15/2020   MCV 104.8 (H) 11/15/2020   PLT 169 11/15/2020     STUDIES: NM PET Image Restag (PS) Skull Base To Thigh  Result Date: 11/12/2020 CLINICAL DATA:  Subsequent treatment strategy for left breast cancer, status post mastectomy and radiation, with osseous metastases. EXAM: NUCLEAR MEDICINE PET SKULL BASE TO THIGH TECHNIQUE: 12.7 mCi F-18 FDG was injected intravenously. Full-ring PET imaging was performed from the skull base to thigh after the radiotracer. CT data was  obtained and used for attenuation correction and anatomic localization. Fasting blood glucose: 96 mg/dl COMPARISON:  CTA chest dated 10/27/2019.  PET-CT dated 06/30/2019. FINDINGS: Mediastinal blood pool activity: SUV max 3.0 Liver activity: SUV max NA NECK: No hypermetabolic cervical lymphadenopathy. Incidental CT findings: none CHEST: Status post left mastectomy. No hypermetabolic thoracic lymphadenopathy. No suspicious pulmonary nodules. Calcified granuloma in the right lower lobe. Radiation changes in the left upper lobe. Incidental CT findings: Atherosclerotic calcifications of the aortic arch. ABDOMEN/PELVIS: No abnormal hypermetabolism in the liver, spleen, pancreas, or adrenal glands. No hypermetabolic abdominopelvic lymphadenopathy. Incidental CT findings: 5.3 cm left upper pole renal cysts. Left colonic diverticulosis, without evidence of diverticulitis. Atherosclerotic calcifications the abdominal aorta and branch vessels. SKELETON: Multifocal osseous metastases, as follows: --Right mandible lesion, max SUV 6.7, new --Subtle left humeral metastasis, max SUV 2.0, previously 3.2 --Subtle left lateral scapular metastasis, max SUV 2.1, previously 3.4 --Dominant left anterior iliac metastasis, max SUV 8.1, previously 17.1 Incidental CT findings: none IMPRESSION: Status post left mastectomy with radiation changes in the left upper lobe. Mixed response of multifocal osseous metastases, with a suspected new lesion the right mandible. No evidence of soft tissue metastases. Electronically Signed   By: Julian Hy M.D.   On: 11/12/2020 21:42     ONCOLOGY HISTORY: Patient initially received neoadjuvant chemotherapy with Adriamycin and Cytoxan followed by weekly Taxol. She only received 4 cycles of weekly Taxol prior to discontinuation of treatment on October 31, 2017 secondary to persistent peripheral neuropathy.  She ultimately required mastectomy and final pathology noted 5 of 6 lymph nodes positive for  disease.  Patient completed adjuvant XRT in mid April 2020.  Pain initiated letrozole in May 2020, this was discontinued secondary to side effects and patient was started on anastrozole.  Nuclear medicine bone scan on June 19, 2019 revealed metastatic disease and anastrozole was subsequently discontinued.  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  PLAN:    1. 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  MRI of the brain on August 24, 2019 reviewed independently with no obvious evidence of metastatic disease.  Repeat PET scan results reviewed independently and reported as above with essentially stable disease.  New lesion in her right mandible may be secondary to dental issues and not metastatic disease.  Patient's CA 27-29 initially increased to 141.9, but appears to have plateaued since November 2021 between 46.9 and 60.4.  Today's result is 65.9.  Despite persistent, mild neutropenia, will continue 125 mg Ibrance for 21 days with 7 days off.  Proceed with Faslodex today.  Patient only requires Xgeva every 3 months at this point.  Return to clinic in 4 weeks for lab and Faslodex only.  Patient will then return to clinic in 8 weeks for further evaluation and continuation of treatment. 2.  Peripheral neuropathy: Chronic and unchanged. 3.  History of pulmonary embolus: Patient was diagnosed with a small pulmonary embolus on January 10, 2018.  She is no longer on anticoagulation.  There was no evidence of recurrent PE on CT scan from October 27, 2019. 4.  Osteopenia: Patient's most recent bone mineral density on March 03, 2019 reported a T score of -1.8 which is only mildly decreased from 1 year prior when the reported T score was -1.6.  Continue calcium and vitamin D  supplementation.  Xgeva as above. 5.  Left hip pain: Patient does not complain of this today.  Continue symptomatic treatment.  Patient has had XRT to this area. 6.  Leukopenia: Chronic and unchanged.  Patient's total white blood cell count is 1.2 today. 7.  Anemia: Chronic and unchanged.  Patient's hemoglobin is 10.4 today. 8.  Renal insufficiency: Chronic and unchanged.  Patient's creatinine is 1.43 today.  Patient expressed understanding and was in agreement with this plan. She also understands that She can call clinic at any time with any questions, concerns, or complaints.   Cancer Staging Breast cancer, stage 2, left ([place]) Staging form: Breast, AJCC 8th Edition - Clinical stage from 07/30/2017: Stage IIA (cT2, cN1, cM0, G2, ER+, PR+, HER2-) - Signed by Lloyd Huger, MD on 07/30/2017 Histologic grading system: 3 grade system Laterality: Left   Lloyd Huger, MD   11/16/2020 1:54 PM

## 2020-11-14 NOTE — Progress Notes (Signed)
[place]  [phone] [phone]  Patient Care Team: [name], [name], Harrell Gave, [name] Rico Junker, RN as Registered Nurse Grayland Ormond Kathlene November, MD as Consulting Physician (Oncology) Bary Castilla Forest Gleason, MD as Consulting Physician (General Surgery) Bary Castilla Forest Gleason, MD as Consulting Physician (General Surgery) Noreene Filbert, MD as Referring Physician (Radiation Oncology) Jeral Fruit, RN as Registered Nurse   Name of the patient: Alicia [name]  [id number]  [birth year]   Date of visit: 11/15/20  HPI: Patient is a 70 y.o. female with recurrent stage IV ER/PR positive, HER2 negative breast cancer. Patient started on Ibrance on 07/09/19.  Reason for Consult: Oral chemotherapy follow-up for Ibrance (palbociclib) therapy.   PAST MEDICAL HISTORY: Past Medical History:  Diagnosis Date   Anxiety    Breast cancer, left ([place]) 07/2017   Depression    Hypertension    Hypothyroidism    Personal history of chemotherapy 2019   LEFT mastectomy-chemo before   Personal history of radiation therapy 03/2018   LEFT mastectomy   Rapid heart rate    Thyroid disease     PAST SURGICAL HISTORY:  Past Surgical History:  Procedure Laterality Date   AXILLARY LYMPH NODE BIOPSY Left 07/16/2017   METASTATIC MAMMARY CARCINOMA   BREAST BIOPSY Left 07/16/2017   [place] bx of left breast mass and left breast LN.  INVASIVE MAMMARY CARCINOMA, NO SPECIAL TYPE.    BREAST EXCISIONAL BIOPSY Right 2001   benign   BREAST LUMPECTOMY WITH SENTINEL LYMPH NODE BIOPSY Left 12/06/2017   Procedure: BREAST LUMPECTOMY WITH SENTINEL LYMPH NODE BX;  Surgeon: Robert Bellow, MD;  Location: [facility] ORS;  Service: General;  Laterality: Left;   COLONOSCOPY     MASTECTOMY Left 12/23/2017   PORTACATH PLACEMENT Right 08/07/2017   Procedure: INSERTION PORT-A-CATH;  Surgeon:  Robert Bellow, MD;  Location: [facility] ORS;  Service: General;  Laterality: Right;   SIMPLE MASTECTOMY WITH AXILLARY SENTINEL NODE BIOPSY Left 12/23/2017   T2,N2 with 6/7 nodes positive. Whole breast radiation.  Surgeon: Robert Bellow, MD;  Location: [facility] ORS;  Service: General;  Laterality: Left;   TONSILLECTOMY      HEMATOLOGY/ONCOLOGY HISTORY:  Oncology History Overview Note  Patient is a 70 year old female who recently self palpated a mass on her left breast.  Subsequent imaging and biopsy revealed the above-stated breast cancer.  Case was also discussed extensively at case conference.  Given the size and the stage of patient's malignancy, she will benefit from neoadjuvant chemotherapy using Adriamycin, Cytoxan, and Taxol.  Patient will also require Neulasta support.  Will get CT scan of the chest, abdomen, and pelvis to assess for any metastatic disease.  Patient will also require port placement and MUGA prior to initiating treatment  CT abdomen/pelvis/chest did not reveal any suspicious lesions concerning for metastatic disease. (08/01/17)  Port-A-Cath placed on 08/07/2017.  Cycle 1 day 1 of AC was given on 08/08/17.     Breast cancer, stage 2, left ([place])  07/24/2017 Initial Diagnosis   Breast cancer, stage 2, left ([place])   07/30/2017 Cancer Staging   Staging form: Breast, AJCC 8th Edition - Clinical stage from 07/30/2017: Stage IIA (cT2, cN1, cM0, G2, ER+, PR+, HER2-) - Signed by Lloyd Huger, MD on 07/30/2017    08/08/2017 - 10/31/2017 Chemotherapy   The patient had DOXOrubicin (ADRIAMYCIN) chemo injection 130 mg, 60 mg/m2 = 130 mg, Intravenous,  Once,  4 of 4 cycles Administration: 130 mg (08/08/2017), 130 mg (08/22/2017), 130 mg (09/05/2017), 130 mg (09/19/2017) palonosetron (ALOXI) injection 0.25 mg, 0.25 mg, Intravenous,  Once, 4 of 4 cycles Administration: 0.25 mg (08/08/2017), 0.25 mg (08/22/2017), 0.25 mg (09/05/2017), 0.25 mg (09/19/2017) pegfilgrastim-cbqv (UDENYCA) injection 6  mg, 6 mg, Subcutaneous, Once, 4 of 4 cycles Administration: 6 mg (08/09/2017), 6 mg (08/23/2017), 6 mg (09/06/2017), 6 mg (09/20/2017) cyclophosphamide (CYTOXAN) 1,300 mg in sodium chloride 0.9 % 250 mL chemo infusion, 600 mg/m2 = 1,300 mg, Intravenous,  Once, 4 of 4 cycles Administration: 1,300 mg (08/08/2017), 1,300 mg (08/22/2017), 1,300 mg (09/05/2017), 1,300 mg (09/19/2017) PACLitaxel (TAXOL) 174 mg in sodium chloride 0.9 % 250 mL chemo infusion (</= 74m/m2), 80 mg/m2 = 174 mg, Intravenous,  Once, 4 of 12 cycles Dose modification: 72 mg/m2 (original dose 80 mg/m2, Cycle 6, Reason: Dose not tolerated) Administration: 174 mg (10/03/2017), 156 mg (10/17/2017), 156 mg (10/24/2017), 156 mg (10/31/2017) fosaprepitant (EMEND) 150 mg, dexamethasone (DECADRON) 12 mg in sodium chloride 0.9 % 145 mL IVPB, , Intravenous,  Once, 4 of 4 cycles Administration:  (08/08/2017),  (08/22/2017),  (09/05/2017),  (09/19/2017)   for chemotherapy treatment.       ALLERGIES:  is allergic to sulfa antibiotics.  MEDICATIONS:  Current Outpatient Medications  Medication Sig Dispense Refill   acetaminophen (TYLENOL) 500 MG tablet Take 1,000 mg by mouth every 6 (six) hours as needed for moderate pain or headache.      ALPRAZolam (XANAX) 0.5 MG tablet Take 1 tablet (0.5 mg total) by mouth 2 (two) times daily. 60 tablet 0   amLODipine (NORVASC) 5 MG tablet Take 1 tablet (5 mg total) by mouth daily. 90 tablet 3   aspirin EC 81 MG tablet Take 81 mg by mouth at bedtime.      calcium carbonate (OS-CAL - DOSED IN MG OF ELEMENTAL CALCIUM) 1250 (500 Ca) MG tablet Take 1 tablet by mouth daily with breakfast.     Cholecalciferol (VITAMIN D3) 125 MCG (5000 UT) CAPS Take 1 capsule by mouth 2 (two) times a day.     docusate sodium (COLACE) 100 MG capsule Take 100 mg by mouth 3 (three) times daily as needed for mild constipation.     hydrochlorothiazide (HYDRODIURIL) 12.5 MG tablet Take 1 tablet (12.5 mg total) by mouth daily. 90 tablet 3    levothyroxine (SYNTHROID) 100 MCG tablet TAKE 1 TABLET BY MOUTH DAILY 90 tablet 3   nystatin-lidocaine-prednisoLONE-diphenhydrAMINE-distilled water-alum & mag hydroxide-simeth Swish and spit 5-129mfour times a day as needed 480 mL 3   ondansetron (ZOFRAN) 8 MG tablet Take 1 tablet (8 mg total) by mouth every 8 (eight) hours as needed for nausea or vomiting. 20 tablet 0   palbociclib (IBRANCE) 125 MG tablet Take 1 tablet (125 mg total) by mouth daily. Take for 21 days, then hold for 7 days. Repeat every 28 days. 21 tablet 3   rosuvastatin (CRESTOR) 5 MG tablet TAKE 1 TABLET BY MOUTH DAILY 30 tablet 6   traZODone (DESYREL) 50 MG tablet TAKE 1 TABLET BY MOUTH DAILY 30 tablet 6   No current facility-administered medications for this visit.    VITAL SIGNS: There were no vitals taken for this visit. There were no vitals filed for this visit.  Estimated body mass index is 35.99 kg/m as calculated from the following:   Height as of 10/25/20: 5' 6" (1.676 m).   Weight as of an earlier encounter on 11/15/20: 101.2 kg (223 lb).  LABS: CBC:  Component Value Date/Time   WBC 2.1 (L) 11/15/2020 1346   HGB 10.4 (L) 11/15/2020 1346   HGB 14.2 02/11/2011 1349   HCT 30.7 (L) 11/15/2020 1346   HCT 40.9 02/11/2011 1349   PLT 169 11/15/2020 1346   PLT 151 02/11/2011 1349   MCV 104.8 (H) 11/15/2020 1346   MCV 89 02/11/2011 1349   NEUTROABS 1.5 (L) 11/15/2020 1346   LYMPHSABS 0.3 (L) 11/15/2020 1346   MONOABS 0.3 11/15/2020 1346   EOSABS 0.0 11/15/2020 1346   BASOSABS 0.0 11/15/2020 1346   Comprehensive Metabolic Panel:    Component Value Date/Time   NA 137 11/15/2020 1346   NA 142 02/11/2011 1349   K 3.4 (L) 11/15/2020 1346   K 3.8 02/11/2011 1349   CL 96 (L) 11/15/2020 1346   CL 107 02/11/2011 1349   CO2 26 11/15/2020 1346   CO2 24 02/11/2011 1349   BUN 22 11/15/2020 1346   BUN 14 02/11/2011 1349   CREATININE 1.43 (H) 11/15/2020 1346   CREATININE 0.65 02/11/2011 1349   GLUCOSE 113 (H)  11/15/2020 1346   GLUCOSE 98 02/11/2011 1349   CALCIUM 9.6 11/15/2020 1346   CALCIUM 9.0 02/11/2011 1349   AST 23 11/15/2020 1346   AST 27 02/11/2011 1349   ALT 16 11/15/2020 1346   ALT 36 02/11/2011 1349   ALKPHOS 78 11/15/2020 1346   ALKPHOS 122 02/11/2011 1349   BILITOT 0.8 11/15/2020 1346   BILITOT 0.5 02/11/2011 1349   PROT 7.8 11/15/2020 1346   PROT 7.0 02/11/2011 1349   ALBUMIN 4.4 11/15/2020 1346   ALBUMIN 4.1 02/11/2011 1349    RADIOGRAPHIC STUDIES: NM PET Image Restag (PS) Skull Base To Thigh  Result Date: 11/12/2020 CLINICAL DATA:  Subsequent treatment strategy for left breast cancer, status post mastectomy and radiation, with osseous metastases. EXAM: NUCLEAR MEDICINE PET SKULL BASE TO THIGH TECHNIQUE: 12.7 mCi F-18 FDG was injected intravenously. Full-ring PET imaging was performed from the skull base to thigh after the radiotracer. CT data was obtained and used for attenuation correction and anatomic localization. Fasting blood glucose: 96 mg/dl COMPARISON:  CTA chest dated 10/27/2019.  PET-CT dated 06/30/2019. FINDINGS: Mediastinal blood pool activity: SUV max 3.0 Liver activity: SUV max NA NECK: No hypermetabolic cervical lymphadenopathy. Incidental CT findings: none CHEST: Status post left mastectomy. No hypermetabolic thoracic lymphadenopathy. No suspicious pulmonary nodules. Calcified granuloma in the right lower lobe. Radiation changes in the left upper lobe. Incidental CT findings: Atherosclerotic calcifications of the aortic arch. ABDOMEN/PELVIS: No abnormal hypermetabolism in the liver, spleen, pancreas, or adrenal glands. No hypermetabolic abdominopelvic lymphadenopathy. Incidental CT findings: 5.3 cm left upper pole renal cysts. Left colonic diverticulosis, without evidence of diverticulitis. Atherosclerotic calcifications the abdominal aorta and branch vessels. SKELETON: Multifocal osseous metastases, as follows: --Right mandible lesion, max SUV 6.7, new --Subtle left  humeral metastasis, max SUV 2.0, previously 3.2 --Subtle left lateral scapular metastasis, max SUV 2.1, previously 3.4 --Dominant left anterior iliac metastasis, max SUV 8.1, previously 17.1 Incidental CT findings: none IMPRESSION: Status post left mastectomy with radiation changes in the left upper lobe. Mixed response of multifocal osseous metastases, with a suspected new lesion the right mandible. No evidence of soft tissue metastases. Electronically Signed   By: Julian Hy M.D.   On: 11/12/2020 21:42     Assessment and Plan-  Reviewed CBC and PET, CA 27.29 pending today. PET from 11/10/20 showed improvement at previously noted osseous mets, new right mandible lesion noted. Ms. Weld endorses pain in that  area and a missing crown. She can note remember the last time she went to the dentist. Suggested patient reestablish with a dentist for evaluation.    Continue palbociclib 156m daily for 21 days on and 7 days off   Oral Chemotherapy Side Effect/Intolerance:  Fatigue: Overall her fatigue continues to be improved with the decrease in her blood pressure medication.  No reported constipation or diarrhea, nausea, or mouth sores  Oral Chemotherapy Adherence: No reported missed doses  Medication Access Issues: no issues, [facility] during office visit today, expected delivery 11/23/20  Patient expressed understanding and was in agreement with this plan. She also understands that She can call clinic at any time with any questions, concerns, or complaints.   Thank you for allowing me to participate in the care of this very pleasant patient.   Time Total: 20 mins  Visit consisted of counseling and education on dealing with issues of symptom management in the setting of serious and potentially life-threatening illness.Greater than 50%  of this time was spent counseling and coordinating care related to the above assessment and plan.  Signed  by: ADarl Pikes PharmD, BCPS, BSalley Slaughter CPP Hematology/Oncology Clinical Pharmacist Practitioner [phone] 11/15/2020 4:37 PM

## 2020-11-15 ENCOUNTER — Inpatient Hospital Stay (HOSPITAL_BASED_OUTPATIENT_CLINIC_OR_DEPARTMENT_OTHER): Payer: Medicare Other | Admitting: Oncology

## 2020-11-15 ENCOUNTER — Other Ambulatory Visit: Payer: Self-pay

## 2020-11-15 ENCOUNTER — Inpatient Hospital Stay: Payer: Medicare Other | Attending: Oncology

## 2020-11-15 ENCOUNTER — Inpatient Hospital Stay: Payer: Medicare Other | Admitting: Pharmacist

## 2020-11-15 ENCOUNTER — Inpatient Hospital Stay: Payer: Medicare Other

## 2020-11-15 ENCOUNTER — Other Ambulatory Visit (HOSPITAL_COMMUNITY): Payer: Self-pay

## 2020-11-15 ENCOUNTER — Encounter: Payer: Self-pay | Admitting: Oncology

## 2020-11-15 VITALS — BP 130/77 | HR 79 | Temp 98.0°F | Resp 18 | Wt 223.0 lb

## 2020-11-15 DIAGNOSIS — Z79899 Other long term (current) drug therapy: Secondary | ICD-10-CM | POA: Diagnosis not present

## 2020-11-15 DIAGNOSIS — C50919 Malignant neoplasm of unspecified site of unspecified female breast: Secondary | ICD-10-CM

## 2020-11-15 DIAGNOSIS — C7951 Secondary malignant neoplasm of bone: Secondary | ICD-10-CM | POA: Diagnosis not present

## 2020-11-15 DIAGNOSIS — C50912 Malignant neoplasm of unspecified site of left female breast: Secondary | ICD-10-CM

## 2020-11-15 DIAGNOSIS — Z5111 Encounter for antineoplastic chemotherapy: Secondary | ICD-10-CM | POA: Diagnosis not present

## 2020-11-15 LAB — CBC WITH DIFFERENTIAL/PLATELET
Abs Immature Granulocytes: 0 10*3/uL (ref 0.00–0.07)
Basophils Absolute: 0 10*3/uL (ref 0.0–0.1)
Basophils Relative: 1 %
Eosinophils Absolute: 0 10*3/uL (ref 0.0–0.5)
Eosinophils Relative: 1 %
HCT: 30.7 % — ABNORMAL LOW (ref 36.0–46.0)
Hemoglobin: 10.4 g/dL — ABNORMAL LOW (ref 12.0–15.0)
Immature Granulocytes: 0 %
Lymphocytes Relative: 14 %
Lymphs Abs: 0.3 10*3/uL — ABNORMAL LOW (ref 0.7–4.0)
MCH: 35.5 pg — ABNORMAL HIGH (ref 26.0–34.0)
MCHC: 33.9 g/dL (ref 30.0–36.0)
MCV: 104.8 fL — ABNORMAL HIGH (ref 80.0–100.0)
Monocytes Absolute: 0.3 10*3/uL (ref 0.1–1.0)
Monocytes Relative: 13 %
Neutro Abs: 1.5 10*3/uL — ABNORMAL LOW (ref 1.7–7.7)
Neutrophils Relative %: 71 %
Platelets: 169 10*3/uL (ref 150–400)
RBC: 2.93 MIL/uL — ABNORMAL LOW (ref 3.87–5.11)
RDW: 14.6 % (ref 11.5–15.5)
Smear Review: NORMAL
WBC: 2.1 10*3/uL — ABNORMAL LOW (ref 4.0–10.5)
nRBC: 0 % (ref 0.0–0.2)

## 2020-11-15 LAB — COMPREHENSIVE METABOLIC PANEL
ALT: 16 U/L (ref 0–44)
AST: 23 U/L (ref 15–41)
Albumin: 4.4 g/dL (ref 3.5–5.0)
Alkaline Phosphatase: 78 U/L (ref 38–126)
Anion gap: 15 (ref 5–15)
BUN: 22 mg/dL (ref 8–23)
CO2: 26 mmol/L (ref 22–32)
Calcium: 9.6 mg/dL (ref 8.9–10.3)
Chloride: 96 mmol/L — ABNORMAL LOW (ref 98–111)
Creatinine, Ser: 1.43 mg/dL — ABNORMAL HIGH (ref 0.44–1.00)
GFR, Estimated: 39 mL/min — ABNORMAL LOW (ref >60)
Glucose, Bld: 113 mg/dL — ABNORMAL HIGH (ref 70–99)
Potassium: 3.4 mmol/L — ABNORMAL LOW (ref 3.5–5.1)
Sodium: 137 mmol/L (ref 135–145)
Total Bilirubin: 0.8 mg/dL (ref 0.3–1.2)
Total Protein: 7.8 g/dL (ref 6.5–8.1)

## 2020-11-15 MED ORDER — FULVESTRANT 250 MG/5ML IM SOSY
500.0000 mg | PREFILLED_SYRINGE | Freq: Once | INTRAMUSCULAR | Status: AC
  Administered 2020-11-15: 500 mg via INTRAMUSCULAR
  Filled 2020-11-15: qty 10

## 2020-11-15 NOTE — Progress Notes (Signed)
Patient denies new problems/concerns today.   °

## 2020-11-16 ENCOUNTER — Other Ambulatory Visit (HOSPITAL_COMMUNITY): Payer: Self-pay

## 2020-11-16 ENCOUNTER — Encounter: Payer: Self-pay | Admitting: Oncology

## 2020-11-16 LAB — CANCER ANTIGEN 27.29: CA 27.29: 65.9 U/mL — ABNORMAL HIGH (ref 0.0–38.6)

## 2020-11-22 ENCOUNTER — Other Ambulatory Visit (HOSPITAL_COMMUNITY): Payer: Self-pay

## 2020-12-13 ENCOUNTER — Inpatient Hospital Stay: Payer: Medicare Other

## 2020-12-13 ENCOUNTER — Other Ambulatory Visit: Payer: Self-pay | Admitting: Pharmacist

## 2020-12-13 ENCOUNTER — Other Ambulatory Visit (HOSPITAL_COMMUNITY): Payer: Self-pay

## 2020-12-13 ENCOUNTER — Other Ambulatory Visit: Payer: Self-pay

## 2020-12-13 DIAGNOSIS — C50919 Malignant neoplasm of unspecified site of unspecified female breast: Secondary | ICD-10-CM

## 2020-12-13 DIAGNOSIS — Z79899 Other long term (current) drug therapy: Secondary | ICD-10-CM | POA: Diagnosis not present

## 2020-12-13 DIAGNOSIS — C7951 Secondary malignant neoplasm of bone: Secondary | ICD-10-CM

## 2020-12-13 DIAGNOSIS — C50912 Malignant neoplasm of unspecified site of left female breast: Secondary | ICD-10-CM

## 2020-12-13 DIAGNOSIS — Z5111 Encounter for antineoplastic chemotherapy: Secondary | ICD-10-CM | POA: Diagnosis not present

## 2020-12-13 LAB — CBC WITH DIFFERENTIAL/PLATELET
Abs Immature Granulocytes: 0.01 10*3/uL (ref 0.00–0.07)
Basophils Absolute: 0 10*3/uL (ref 0.0–0.1)
Basophils Relative: 2 %
Eosinophils Absolute: 0 10*3/uL (ref 0.0–0.5)
Eosinophils Relative: 1 %
HCT: 30.4 % — ABNORMAL LOW (ref 36.0–46.0)
Hemoglobin: 10.3 g/dL — ABNORMAL LOW (ref 12.0–15.0)
Immature Granulocytes: 1 %
Lymphocytes Relative: 15 %
Lymphs Abs: 0.3 10*3/uL — ABNORMAL LOW (ref 0.7–4.0)
MCH: 36 pg — ABNORMAL HIGH (ref 26.0–34.0)
MCHC: 33.9 g/dL (ref 30.0–36.0)
MCV: 106.3 fL — ABNORMAL HIGH (ref 80.0–100.0)
Monocytes Absolute: 0.2 10*3/uL (ref 0.1–1.0)
Monocytes Relative: 13 %
Neutro Abs: 1.3 10*3/uL — ABNORMAL LOW (ref 1.7–7.7)
Neutrophils Relative %: 68 %
Platelets: 134 10*3/uL — ABNORMAL LOW (ref 150–400)
RBC: 2.86 MIL/uL — ABNORMAL LOW (ref 3.87–5.11)
RDW: 15 % (ref 11.5–15.5)
WBC: 1.8 10*3/uL — ABNORMAL LOW (ref 4.0–10.5)
nRBC: 0 % (ref 0.0–0.2)

## 2020-12-13 LAB — COMPREHENSIVE METABOLIC PANEL
ALT: 16 U/L (ref 0–44)
AST: 22 U/L (ref 15–41)
Albumin: 4 g/dL (ref 3.5–5.0)
Alkaline Phosphatase: 65 U/L (ref 38–126)
Anion gap: 12 (ref 5–15)
BUN: 19 mg/dL (ref 8–23)
CO2: 23 mmol/L (ref 22–32)
Calcium: 8.9 mg/dL (ref 8.9–10.3)
Chloride: 100 mmol/L (ref 98–111)
Creatinine, Ser: 1.22 mg/dL — ABNORMAL HIGH (ref 0.44–1.00)
GFR, Estimated: 47 mL/min — ABNORMAL LOW (ref >60)
Glucose, Bld: 104 mg/dL — ABNORMAL HIGH (ref 70–99)
Potassium: 3.8 mmol/L (ref 3.5–5.1)
Sodium: 135 mmol/L (ref 135–145)
Total Bilirubin: 0.8 mg/dL (ref 0.3–1.2)
Total Protein: 7.2 g/dL (ref 6.5–8.1)

## 2020-12-13 MED ORDER — FULVESTRANT 250 MG/5ML IM SOSY
500.0000 mg | PREFILLED_SYRINGE | Freq: Once | INTRAMUSCULAR | Status: AC
  Administered 2020-12-13: 500 mg via INTRAMUSCULAR

## 2020-12-13 MED ORDER — PALBOCICLIB 125 MG PO TABS
125.0000 mg | ORAL_TABLET | Freq: Every day | ORAL | 3 refills | Status: DC
  Filled 2020-12-13: qty 21, 28d supply, fill #0
  Filled 2021-01-11: qty 21, 28d supply, fill #1
  Filled 2021-02-06: qty 21, 28d supply, fill #2
  Filled 2021-03-06: qty 21, 28d supply, fill #3

## 2020-12-14 LAB — CANCER ANTIGEN 27.29: CA 27.29: 72.7 U/mL — ABNORMAL HIGH (ref 0.0–38.6)

## 2020-12-16 ENCOUNTER — Other Ambulatory Visit (HOSPITAL_COMMUNITY): Payer: Self-pay

## 2020-12-19 ENCOUNTER — Other Ambulatory Visit (HOSPITAL_COMMUNITY): Payer: Self-pay

## 2020-12-20 DIAGNOSIS — Z20822 Contact with and (suspected) exposure to covid-19: Secondary | ICD-10-CM | POA: Diagnosis not present

## 2021-01-09 NOTE — Progress Notes (Signed)
[place]  Telephone:(336) [phone] Fax:(336) [phone]  ID: Alicia [name] OB: 09/15/1950  MR#: [id number]  GHW#:[id number]  Patient Care Team: [name], [name], Harrell Gave, [name] Rico Junker, RN as Registered Nurse Lloyd Huger, MD as Consulting Physician (Oncology) Bary Castilla Forest Gleason, MD as Consulting Physician (General Surgery) Bary Castilla Forest Gleason, MD as Consulting Physician (General Surgery) Noreene Filbert, MD as Referring Physician (Radiation Oncology) Jeral Fruit, RN as Registered Nurse   CHIEF COMPLAINT: Recurrent stage IV ER/PR positive, 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  INTERVAL HISTORY: Patient returns to clinic today for further evaluation and continuation of treatment.  She reports occasional weakness in her legs, but denies any falls.  She otherwise feels well. She continues to tolerate her treatments without significant side effects. She does not complain of pain today.  She continues to have a mild peripheral neuropathy, but no other neurologic complaints.  She denies any chest pain, shortness of breath, cough, or hemoptysis.  She denies any nausea, vomiting, constipation, or diarrhea.  She has no urinary complaints. Patient offers no further specific complaints today.  REVIEW OF SYSTEMS:   Review of Systems  Constitutional: Negative.  Negative for fever, malaise/fatigue and weight loss.  HENT:  Negative for congestion.   Respiratory: Negative.  Negative for cough and shortness of breath.   Cardiovascular: Negative.  Negative for chest pain and leg swelling.  Gastrointestinal: Negative.  Negative for abdominal pain, constipation, diarrhea and nausea.  Genitourinary: Negative.  Negative for dysuria and urgency.  Musculoskeletal: Negative.  Negative for back pain and joint pain.  Skin: Negative.  Negative for rash.  Neurological:   Positive for tingling, sensory change and weakness. Negative for dizziness, focal weakness and headaches.  Psychiatric/Behavioral:  The patient is not nervous/anxious.    As per HPI. Otherwise, a complete review of systems is negative.  PAST MEDICAL HISTORY: Past Medical History:  Diagnosis Date   Anxiety    Breast cancer, left ([place]) 07/2017   Depression    Hypertension    Hypothyroidism    Personal history of chemotherapy 2019   LEFT mastectomy-chemo before   Personal history of radiation therapy 03/2018   LEFT mastectomy   Rapid heart rate    Thyroid disease     PAST SURGICAL HISTORY: Past Surgical History:  Procedure Laterality Date   AXILLARY LYMPH NODE BIOPSY Left 07/16/2017   METASTATIC MAMMARY CARCINOMA   BREAST BIOPSY Left 07/16/2017   [place] bx of left breast mass and left breast LN.  INVASIVE MAMMARY CARCINOMA, NO SPECIAL TYPE.    BREAST EXCISIONAL BIOPSY Right 2001   benign   BREAST LUMPECTOMY WITH SENTINEL LYMPH NODE BIOPSY Left 12/06/2017   Procedure: BREAST LUMPECTOMY WITH SENTINEL LYMPH NODE BX;  Surgeon: Robert Bellow, MD;  Location: [facility] OR[facility];  Service: General;  Laterality: Left;   COLONOSCOPY     MASTECTOMY Left 12/23/2017   PORTACATH PLACEMENT Right 08/07/2017   Procedure: INSERTION PORT-A-CATH;  Surgeon: Robert Bellow, MD;  Location: [facility];  Service: General;  Laterality: Right;   SIMPLE MASTECTOMY WITH AXILLARY SENTINEL NODE BIOPSY Left 12/23/2017   T2,N2 with 6/7 nodes positive. Whole breast radiation.  Surgeon: Robert Bellow, MD;  Location: [facility] OR[facility];  Service: General;  Laterality: Left;   TONSILLECTOMY      FAMILY HISTORY: Family History  Problem Relation Age of Onset   Stroke Mother    Thyroid disease Mother  Renal Disease Mother    Stroke Father    Heart attack Father    Sudden death Father 81       suicide   Anuerysm Brother    Breast cancer Neg Hx     ADVANCED DIRECTIVES (Y/N):  N  HEALTH MAINTENANCE: Social History    Tobacco Use   Smoking status: Former    Packs/day: 1.00    Years: 30.00    Pack years: 30.00    Types: Cigarettes    Quit date: 2019    Years since quitting: 3.9   Smokeless tobacco: Never  Vaping Use   Vaping Use: Never used  Substance Use Topics   Alcohol use: Not Currently   Drug use: Never     Colonoscopy:  PAP:  Bone density:  Lipid panel:  Allergies  Allergen Reactions   Sulfa Antibiotics Diarrhea    Current Outpatient Medications  Medication Sig Dispense Refill   acetaminophen (TYLENOL) 500 MG tablet Take 1,000 mg by mouth every 6 (six) hours as needed for moderate pain or headache.      ALPRAZolam (XANAX) 0.5 MG tablet Take 1 tablet (0.5 mg total) by mouth 2 (two) times daily. 60 tablet 0   amLODipine (NORVASC) 5 MG tablet Take 1 tablet (5 mg total) by mouth daily. 90 tablet 3   aspirin EC 81 MG tablet Take 81 mg by mouth at bedtime.      calcium carbonate (OS-CAL - DOSED IN MG OF ELEMENTAL CALCIUM) 1250 (500 Ca) MG tablet Take 1 tablet by mouth daily with breakfast.     Cholecalciferol (VITAMIN D3) 125 MCG (5000 UT) CAPS Take 1 capsule by mouth 2 (two) times a day.     docusate sodium (COLACE) 100 MG capsule Take 100 mg by mouth 3 (three) times daily as needed for mild constipation.     hydrochlorothiazide (HYDRODIURIL) 12.5 MG tablet Take 1 tablet (12.5 mg total) by mouth daily. 90 tablet 3   levothyroxine (SYNTHROID) 100 MCG tablet TAKE 1 TABLET BY MOUTH DAILY 90 tablet 3   nystatin-lidocaine-prednisoLONE-diphenhydrAMINE-distilled water-alum & mag hydroxide-simeth Swish and spit 5-25m four times a day as needed 480 mL 3   ondansetron (ZOFRAN) 8 MG tablet Take 1 tablet (8 mg total) by mouth every 8 (eight) hours as needed for nausea or vomiting. 20 tablet 0   palbociclib (IBRANCE) 125 MG tablet Take 1 tablet (125 mg total) by mouth daily. Take for 21 days, then hold for 7 days. Repeat every 28 days. 21 tablet 3   potassium chloride SA (KLOR-CON M) 20 MEQ tablet  Take 1 tablet (20 mEq total) by mouth 2 (two) times daily. 60 tablet 3   rosuvastatin (CRESTOR) 5 MG tablet TAKE 1 TABLET BY MOUTH DAILY 30 tablet 6   traZODone (DESYREL) 50 MG tablet TAKE 1 TABLET BY MOUTH DAILY 30 tablet 6   No current facility-administered medications for this visit.    OBJECTIVE: Vitals:   01/10/21 1352  BP: 123/64  Pulse: 80  Resp: 16  Temp: (!) 97.3 F (36.3 C)  SpO2: 98%     Body mass index is 35.35 kg/m.    ECOG FS:0 - Asymptomatic  General: Well-developed, well-nourished, no acute distress. Eyes: Pink conjunctiva, anicteric sclera. HEENT: Normocephalic, moist mucous membranes. Lungs: No audible wheezing or coughing. Heart: Regular rate and rhythm. Abdomen: Soft, nontender, no obvious distention. Musculoskeletal: No edema, cyanosis, or clubbing. Neuro: Alert, answering all questions appropriately. Cranial nerves grossly intact. Skin: No rashes or petechiae noted.  Psych: Normal affect.  LAB RESULTS:  Lab Results  Component Value Date   NA 134 (L) 01/10/2021   K 2.9 (L) 01/10/2021   CL 95 (L) 01/10/2021   CO2 27 01/10/2021   GLUCOSE 77 01/10/2021   BUN 19 01/10/2021   CREATININE 1.34 (H) 01/10/2021   CALCIUM 9.2 01/10/2021   PROT 7.4 01/10/2021   ALBUMIN 4.1 01/10/2021   AST 25 01/10/2021   ALT 15 01/10/2021   ALKPHOS 67 01/10/2021   BILITOT 0.8 01/10/2021   GFRNONAA 42 (L) 01/10/2021   GFRAA 57 (L) 10/01/2019    Lab Results  Component Value Date   WBC 1.8 (L) 01/10/2021   NEUTROABS 1.3 (L) 01/10/2021   HGB 10.4 (L) 01/10/2021   HCT 30.9 (L) 01/10/2021   MCV 106.2 (H) 01/10/2021   PLT 149 (L) 01/10/2021     STUDIES: No results found.   ONCOLOGY HISTORY: Patient initially received neoadjuvant chemotherapy with Adriamycin and Cytoxan followed by weekly Taxol. She only received 4 cycles of weekly Taxol prior to discontinuation of treatment on October 31, 2017 secondary to persistent peripheral neuropathy.  She ultimately required  mastectomy and final pathology noted 5 of 6 lymph nodes positive for disease.  Patient completed adjuvant XRT in mid April 2020.  Pain initiated letrozole in May 2020, this was discontinued secondary to side effects and patient was started on anastrozole.  Nuclear medicine bone scan on June 19, 2019 revealed metastatic disease and anastrozole was subsequently discontinued.  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  PLAN:    1. 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  MRI of the brain on August 24, 2019 reviewed independently with no obvious evidence of metastatic disease.  Repeat PET scan results reviewed independently and reported as above with essentially stable disease.  New lesion in her right mandible may be secondary to dental issues and not metastatic disease.  Patient's CA 27-29 initially increased to 141.9, but appears to have plateaued since November 2021 between 46.9 and 72.7.  Today's result is pending.  Despite persistent, mild neutropenia, will continue 125 mg Ibrance for 21 days with 7 days off.  See with Faslodex today.  Patient only requires Xgeva every other month at this point.  Return to clinic in 4 weeks for laboratory work and Faslodex only.  Patient will then return to clinic in 8 weeks for further evaluation and continuation of treatment.   2.  Peripheral neuropathy: Chronic and unchanged. 3.  History of pulmonary embolus: Patient was diagnosed with a small pulmonary embolus on January 10, 2018.  She is no longer on anticoagulation.  There was no evidence of recurrent PE on CT scan from October 27, 2019. 4.  Osteopenia: Patient's most recent bone mineral density on March 03, 2019 reported a T score of -1.8 which is only mildly decreased  from 1 year prior when the reported T score was -1.6.  Continue calcium and vitamin D supplementation.  Xgeva as above. 5.  Left hip pain: Patient does not complain of this today.  Continue symptomatic treatment.  Patient has had XRT to this area. 6.  Leukopenia: Chronic and unchanged.  Patient's total white blood cell count is mildly improved to 1.8 today. 7.  Anemia: Chronic and unchanged.  Patient's hemoglobin is 10.4 today. 8.  Renal insufficiency:  Chronic and unchanged.  Patient's creatinine is 1.34 today. 9.  Hypokalemia: Patient was given a prescription for potassium supplementation.  Patient expressed understanding and was in agreement with this plan. She also understands that She can call clinic at any time with any questions, concerns, or complaints.    Cancer Staging  Breast cancer, stage 2, left ([place]) Staging form: Breast, AJCC 8th Edition - Clinical stage from 07/30/2017: Stage IIA (cT2, cN1, cM0, G2, ER+, PR+, HER2-) - Signed by Lloyd Huger, MD on 07/30/2017 Histologic grading system: 3 grade system Laterality: Left   Lloyd Huger, MD   01/10/2021 3:21 PM

## 2021-01-10 ENCOUNTER — Inpatient Hospital Stay: Payer: Medicare Other | Admitting: Oncology

## 2021-01-10 ENCOUNTER — Encounter: Payer: Self-pay | Admitting: Oncology

## 2021-01-10 ENCOUNTER — Inpatient Hospital Stay: Payer: Medicare Other | Attending: Oncology | Admitting: Oncology

## 2021-01-10 ENCOUNTER — Inpatient Hospital Stay: Payer: Medicare Other

## 2021-01-10 ENCOUNTER — Other Ambulatory Visit: Payer: Self-pay

## 2021-01-10 VITALS — BP 123/64 | HR 80 | Temp 97.3°F | Resp 16 | Ht 66.0 in | Wt 219.0 lb

## 2021-01-10 DIAGNOSIS — E876 Hypokalemia: Secondary | ICD-10-CM | POA: Diagnosis not present

## 2021-01-10 DIAGNOSIS — Z9012 Acquired absence of left breast and nipple: Secondary | ICD-10-CM | POA: Diagnosis not present

## 2021-01-10 DIAGNOSIS — C7951 Secondary malignant neoplasm of bone: Secondary | ICD-10-CM | POA: Insufficient documentation

## 2021-01-10 DIAGNOSIS — Z7982 Long term (current) use of aspirin: Secondary | ICD-10-CM | POA: Insufficient documentation

## 2021-01-10 DIAGNOSIS — G629 Polyneuropathy, unspecified: Secondary | ICD-10-CM | POA: Diagnosis not present

## 2021-01-10 DIAGNOSIS — Z79818 Long term (current) use of other agents affecting estrogen receptors and estrogen levels: Secondary | ICD-10-CM | POA: Insufficient documentation

## 2021-01-10 DIAGNOSIS — Z923 Personal history of irradiation: Secondary | ICD-10-CM | POA: Insufficient documentation

## 2021-01-10 DIAGNOSIS — D72819 Decreased white blood cell count, unspecified: Secondary | ICD-10-CM | POA: Diagnosis not present

## 2021-01-10 DIAGNOSIS — Z79899 Other long term (current) drug therapy: Secondary | ICD-10-CM | POA: Diagnosis not present

## 2021-01-10 DIAGNOSIS — D649 Anemia, unspecified: Secondary | ICD-10-CM | POA: Insufficient documentation

## 2021-01-10 DIAGNOSIS — C50912 Malignant neoplasm of unspecified site of left female breast: Secondary | ICD-10-CM | POA: Diagnosis not present

## 2021-01-10 DIAGNOSIS — C50919 Malignant neoplasm of unspecified site of unspecified female breast: Secondary | ICD-10-CM

## 2021-01-10 DIAGNOSIS — M8580 Other specified disorders of bone density and structure, unspecified site: Secondary | ICD-10-CM | POA: Insufficient documentation

## 2021-01-10 DIAGNOSIS — Z170 Estrogen receptor positive status [ER+]: Secondary | ICD-10-CM | POA: Diagnosis not present

## 2021-01-10 DIAGNOSIS — I10 Essential (primary) hypertension: Secondary | ICD-10-CM | POA: Diagnosis not present

## 2021-01-10 DIAGNOSIS — Z86711 Personal history of pulmonary embolism: Secondary | ICD-10-CM | POA: Insufficient documentation

## 2021-01-10 DIAGNOSIS — E039 Hypothyroidism, unspecified: Secondary | ICD-10-CM | POA: Diagnosis not present

## 2021-01-10 DIAGNOSIS — Z87891 Personal history of nicotine dependence: Secondary | ICD-10-CM | POA: Insufficient documentation

## 2021-01-10 LAB — CBC WITH DIFFERENTIAL/PLATELET
Abs Immature Granulocytes: 0.02 10*3/uL (ref 0.00–0.07)
Basophils Absolute: 0 10*3/uL (ref 0.0–0.1)
Basophils Relative: 1 %
Eosinophils Absolute: 0 10*3/uL (ref 0.0–0.5)
Eosinophils Relative: 1 %
HCT: 30.9 % — ABNORMAL LOW (ref 36.0–46.0)
Hemoglobin: 10.4 g/dL — ABNORMAL LOW (ref 12.0–15.0)
Immature Granulocytes: 1 %
Lymphocytes Relative: 15 %
Lymphs Abs: 0.3 10*3/uL — ABNORMAL LOW (ref 0.7–4.0)
MCH: 35.7 pg — ABNORMAL HIGH (ref 26.0–34.0)
MCHC: 33.7 g/dL (ref 30.0–36.0)
MCV: 106.2 fL — ABNORMAL HIGH (ref 80.0–100.0)
Monocytes Absolute: 0.2 10*3/uL (ref 0.1–1.0)
Monocytes Relative: 11 %
Neutro Abs: 1.3 10*3/uL — ABNORMAL LOW (ref 1.7–7.7)
Neutrophils Relative %: 71 %
Platelets: 149 10*3/uL — ABNORMAL LOW (ref 150–400)
RBC: 2.91 MIL/uL — ABNORMAL LOW (ref 3.87–5.11)
RDW: 14.8 % (ref 11.5–15.5)
Smear Review: NORMAL
WBC: 1.8 10*3/uL — ABNORMAL LOW (ref 4.0–10.5)
nRBC: 0 % (ref 0.0–0.2)

## 2021-01-10 LAB — COMPREHENSIVE METABOLIC PANEL
ALT: 15 U/L (ref 0–44)
AST: 25 U/L (ref 15–41)
Albumin: 4.1 g/dL (ref 3.5–5.0)
Alkaline Phosphatase: 67 U/L (ref 38–126)
Anion gap: 12 (ref 5–15)
BUN: 19 mg/dL (ref 8–23)
CO2: 27 mmol/L (ref 22–32)
Calcium: 9.2 mg/dL (ref 8.9–10.3)
Chloride: 95 mmol/L — ABNORMAL LOW (ref 98–111)
Creatinine, Ser: 1.34 mg/dL — ABNORMAL HIGH (ref 0.44–1.00)
GFR, Estimated: 42 mL/min — ABNORMAL LOW (ref >60)
Glucose, Bld: 77 mg/dL (ref 70–99)
Potassium: 2.9 mmol/L — ABNORMAL LOW (ref 3.5–5.1)
Sodium: 134 mmol/L — ABNORMAL LOW (ref 135–145)
Total Bilirubin: 0.8 mg/dL (ref 0.3–1.2)
Total Protein: 7.4 g/dL (ref 6.5–8.1)

## 2021-01-10 MED ORDER — FULVESTRANT 250 MG/5ML IM SOSY
500.0000 mg | PREFILLED_SYRINGE | Freq: Once | INTRAMUSCULAR | Status: AC
  Administered 2021-01-10: 15:00:00 500 mg via INTRAMUSCULAR
  Filled 2021-01-10: qty 10

## 2021-01-10 MED ORDER — POTASSIUM CHLORIDE CRYS ER 20 MEQ PO TBCR: 20.0000 meq | EXTENDED_RELEASE_TABLET | Freq: Two times a day (BID) | ORAL | 3 refills | Status: DC

## 2021-01-10 MED ORDER — DENOSUMAB 120 MG/1.7ML ~~LOC~~ SOLN
120.0000 mg | Freq: Once | SUBCUTANEOUS | Status: AC
  Administered 2021-01-10: 15:00:00 120 mg via SUBCUTANEOUS
  Filled 2021-01-10: qty 1.7

## 2021-01-10 NOTE — Progress Notes (Signed)
Did note that her left leg has been giving out a bit when she walks. She wanted to talk about that issue. Has not had any falls.

## 2021-01-11 ENCOUNTER — Other Ambulatory Visit (HOSPITAL_COMMUNITY): Payer: Self-pay

## 2021-01-11 ENCOUNTER — Encounter: Payer: Self-pay | Admitting: Oncology

## 2021-01-11 LAB — CANCER ANTIGEN 27.29: CA 27.29: 81.4 U/mL — ABNORMAL HIGH (ref 0.0–38.6)

## 2021-01-11 NOTE — Progress Notes (Signed)
Encounter in error

## 2021-01-12 ENCOUNTER — Other Ambulatory Visit (HOSPITAL_COMMUNITY): Payer: Self-pay

## 2021-01-15 ENCOUNTER — Other Ambulatory Visit: Payer: Self-pay | Admitting: Internal Medicine

## 2021-01-24 ENCOUNTER — Encounter: Payer: Self-pay | Admitting: Internal Medicine

## 2021-01-24 ENCOUNTER — Other Ambulatory Visit: Payer: Self-pay | Admitting: *Deleted

## 2021-01-24 ENCOUNTER — Other Ambulatory Visit: Payer: Self-pay

## 2021-01-24 ENCOUNTER — Ambulatory Visit (INDEPENDENT_AMBULATORY_CARE_PROVIDER_SITE_OTHER): Payer: Medicare Other | Admitting: Internal Medicine

## 2021-01-24 VITALS — BP 130/56 | HR 80 | Ht 66.0 in | Wt 212.2 lb

## 2021-01-24 DIAGNOSIS — K219 Gastro-esophageal reflux disease without esophagitis: Secondary | ICD-10-CM | POA: Diagnosis not present

## 2021-01-24 DIAGNOSIS — R000 Tachycardia, unspecified: Secondary | ICD-10-CM

## 2021-01-24 DIAGNOSIS — C50919 Malignant neoplasm of unspecified site of unspecified female breast: Secondary | ICD-10-CM | POA: Diagnosis not present

## 2021-01-24 DIAGNOSIS — F419 Anxiety disorder, unspecified: Secondary | ICD-10-CM | POA: Diagnosis not present

## 2021-01-24 DIAGNOSIS — C7951 Secondary malignant neoplasm of bone: Secondary | ICD-10-CM

## 2021-01-24 DIAGNOSIS — I10 Essential (primary) hypertension: Secondary | ICD-10-CM | POA: Diagnosis not present

## 2021-01-24 MED ORDER — HYDROCHLOROTHIAZIDE 12.5 MG PO TABS: 12.5000 mg | ORAL_TABLET | Freq: Every day | ORAL | 3 refills | Status: DC

## 2021-01-24 MED ORDER — ALPRAZOLAM 0.5 MG PO TABS: 0.5000 mg | ORAL_TABLET | Freq: Two times a day (BID) | ORAL | 0 refills | Status: DC

## 2021-01-24 NOTE — Assessment & Plan Note (Signed)
Refer to oncologist. 

## 2021-01-24 NOTE — Assessment & Plan Note (Signed)
-   The patient's GERD is stable on medication.  - Instructed the patient to avoid eating spicy and acidic foods, as well as foods high in fat. - Instructed the patient to avoid eating large meals or meals 2-3 hours prior to sleeping. 

## 2021-01-24 NOTE — Assessment & Plan Note (Signed)

## 2021-01-24 NOTE — Progress Notes (Signed)
Established Patient Office Visit  Subjective:  Patient ID: Alicia [name], female    DOB: 21-Oct-1950  Age: 71 y.o. MRN: [id number]  CC:  Chief Complaint  Patient presents with   Follow-up    Patient here today for refill of xanax     HPI  Alicia [name] presents for check up  Past Medical History:  Diagnosis Date   Anxiety    Breast cancer, left ([place]) 07/2017   Depression    Hypertension    Hypothyroidism    Personal history of chemotherapy 2019   LEFT mastectomy-chemo before   Personal history of radiation therapy 03/2018   LEFT mastectomy   Rapid heart rate    Thyroid disease     Past Surgical History:  Procedure Laterality Date   AXILLARY LYMPH NODE BIOPSY Left 07/16/2017   METASTATIC MAMMARY CARCINOMA   BREAST BIOPSY Left 07/16/2017   [place] bx of left breast mass and left breast LN.  INVASIVE MAMMARY CARCINOMA, NO SPECIAL TYPE.    BREAST EXCISIONAL BIOPSY Right 2001   benign   BREAST LUMPECTOMY WITH SENTINEL LYMPH NODE BIOPSY Left 12/06/2017   Procedure: BREAST LUMPECTOMY WITH SENTINEL LYMPH NODE BX;  Surgeon: Robert Bellow, MD;  Location: [facility] ORS;  Service: General;  Laterality: Left;   COLONOSCOPY     MASTECTOMY Left 12/23/2017   PORTACATH PLACEMENT Right 08/07/2017   Procedure: INSERTION PORT-A-CATH;  Surgeon: Robert Bellow, MD;  Location: [facility] OR[facility];  Service: General;  Laterality: Right;   SIMPLE MASTECTOMY WITH AXILLARY SENTINEL NODE BIOPSY Left 12/23/2017   T2,N2 with 6/7 nodes positive. Whole breast radiation.  Surgeon: Robert Bellow, MD;  Location: [facility] ORS;  Service: General;  Laterality: Left;   TONSILLECTOMY      Family History  Problem Relation Age of Onset   Stroke Mother    Thyroid disease Mother    Renal Disease Mother    Stroke Father    Heart attack Father    Sudden death Father 34       suicide   Anuerysm Brother    Breast cancer Neg Hx     Social History   Socioeconomic History   Marital status: Widowed     Spouse name: Not on file   Number of children: Not on file   Years of education: Not on file   Highest education level: Not on file  Occupational History   Not on file  Tobacco Use   Smoking status: Former    Packs/day: 1.00    Years: 30.00    Pack years: 30.00    Types: Cigarettes    Quit date: 2019    Years since quitting: 4.0   Smokeless tobacco: Never  Vaping Use   Vaping Use: Never used  Substance and Sexual Activity   Alcohol use: Not Currently   Drug use: Never   Sexual activity: Not Currently  Other Topics Concern   Not on file  Social History Narrative   Not on file   Social Determinants of Health   Financial Resource Strain: Low Risk    Difficulty of Paying Living Expenses: Not very hard  Food Insecurity: Food Insecurity Present   Worried About Running Out of Food in the Last Year: Sometimes true   Ran Out of Food in the Last Year: Never true  Transportation Needs: No Transportation Needs   Lack of Transportation (Medical): No   Lack of Transportation (Non-Medical): No  Physical Activity: Insufficiently Active   Days of  Exercise per Week: 2 days   Minutes of Exercise per Session: 20 min  Stress: No Stress Concern Present   Feeling of Stress : Not at all  Social Connections: Socially Isolated   Frequency of Communication with Friends and Family: Twice a week   Frequency of Social Gatherings with Friends and Family: Twice a week   Attends Religious Services: Never   Marine scientist or Organizations: No   Attends Archivist Meetings: Never   Marital Status: Widowed  Human resources officer Violence: Not At Risk   Fear of Current or Ex-Partner: No   Emotionally Abused: No   Physically Abused: No   Sexually Abused: No     Current Outpatient Medications:    acetaminophen (TYLENOL) 500 MG tablet, Take 1,000 mg by mouth every 6 (six) hours as needed for moderate pain or headache. , Disp: , Rfl:    amLODipine (NORVASC) 5 MG tablet, Take 1 tablet (5  mg total) by mouth daily., Disp: 90 tablet, Rfl: 3   aspirin EC 81 MG tablet, Take 81 mg by mouth at bedtime. , Disp: , Rfl:    calcium carbonate (OS-CAL - DOSED IN MG OF ELEMENTAL CALCIUM) 1250 (500 Ca) MG tablet, Take 1 tablet by mouth daily with breakfast., Disp: , Rfl:    Cholecalciferol (VITAMIN D3) 125 MCG (5000 UT) CAPS, Take 1 capsule by mouth 2 (two) times a day., Disp: , Rfl:    docusate sodium (COLACE) 100 MG capsule, Take 100 mg by mouth 3 (three) times daily as needed for mild constipation., Disp: , Rfl:    hydrochlorothiazide (HYDRODIURIL) 12.5 MG tablet, Take 1 tablet (12.5 mg total) by mouth daily., Disp: 90 tablet, Rfl: 3   levothyroxine (SYNTHROID) 100 MCG tablet, TAKE 1 TABLET BY MOUTH DAILY, Disp: 90 tablet, Rfl: 3   nystatin-lidocaine-prednisoLONE-diphenhydrAMINE-distilled water-alum & mag hydroxide-simeth, Swish and spit 5-40ml four times a day as needed, Disp: 480 mL, Rfl: 3   ondansetron (ZOFRAN) 8 MG tablet, Take 1 tablet (8 mg total) by mouth every 8 (eight) hours as needed for nausea or vomiting., Disp: 20 tablet, Rfl: 0   palbociclib (IBRANCE) 125 MG tablet, Take 1 tablet (125 mg total) by mouth daily. Take for 21 days, then hold for 7 days. Repeat every 28 days., Disp: 21 tablet, Rfl: 3   potassium chloride SA (KLOR-CON M) 20 MEQ tablet, Take 1 tablet (20 mEq total) by mouth 2 (two) times daily., Disp: 60 tablet, Rfl: 3   rosuvastatin (CRESTOR) 5 MG tablet, TAKE 1 TABLET BY MOUTH DAILY, Disp: 30 tablet, Rfl: 6   traZODone (DESYREL) 50 MG tablet, TAKE 1 TABLET BY MOUTH DAILY, Disp: 30 tablet, Rfl: 6   ALPRAZolam (XANAX) 0.5 MG tablet, Take 1 tablet (0.5 mg total) by mouth 2 (two) times daily., Disp: 60 tablet, Rfl: 0   Allergies  Allergen Reactions   Sulfa Antibiotics Diarrhea    ROS Review of Systems  Constitutional: Negative.   HENT: Negative.    Eyes: Negative.   Respiratory: Negative.    Cardiovascular: Negative.   Gastrointestinal: Negative.   Endocrine:  Negative.   Genitourinary: Negative.   Musculoskeletal: Negative.   Skin: Negative.   Allergic/Immunologic: Negative.   Neurological: Negative.   Hematological: Negative.   Psychiatric/Behavioral: Negative.    All other systems reviewed and are negative.    Objective:    Physical Exam Vitals reviewed.  Constitutional:      Appearance: Normal appearance.  HENT:     Mouth/Throat:  Mouth: Mucous membranes are moist.  Eyes:     Pupils: Pupils are equal, round, and reactive to light.  Neck:     Vascular: No carotid bruit.  Cardiovascular:     Rate and Rhythm: Normal rate and regular rhythm.     Pulses: Normal pulses.     Heart sounds: Normal heart sounds.  Pulmonary:     Effort: Pulmonary effort is normal.     Breath sounds: Normal breath sounds.  Abdominal:     General: Bowel sounds are normal.     Palpations: Abdomen is soft. There is no hepatomegaly, splenomegaly or mass.     Tenderness: There is no abdominal tenderness.     Hernia: No hernia is present.  Musculoskeletal:        General: No tenderness.     Cervical back: Neck supple.     Right lower leg: No edema.     Left lower leg: No edema.  Skin:    Findings: No rash.  Neurological:     Mental Status: She is alert and oriented to person, place, and time.     Motor: No weakness.  Psychiatric:        Mood and Affect: Mood and affect normal.        Behavior: Behavior normal.    BP (!) 130/56    Pulse 80    Ht 5\' 6"  (1.676 m)    Wt 212 lb 3.2 oz (96.3 kg)    BMI 34.25 kg/m  Wt Readings from Last 3 Encounters:  01/24/21 212 lb 3.2 oz (96.3 kg)  01/10/21 219 lb (99.3 kg)  11/15/20 223 lb (101.2 kg)     Health Maintenance Due  Topic Date Due   Pneumonia Vaccine 16+ Years old (1 - PCV) Never done   Hepatitis C Screening  Never done   Zoster Vaccines- Shingrix (1 of 2) Never done   COLONOSCOPY (Pts 45-66yrs Insurance coverage will need to be confirmed)  Never done   COVID-19 Vaccine (3 - Pfizer risk  series) 06/30/2019   TETANUS/TDAP  09/13/2020    There are no preventive care reminders to display for this patient.  Lab Results  Component Value Date   TSH 0.925 10/27/2019   Lab Results  Component Value Date   WBC 1.8 (L) 01/10/2021   HGB 10.4 (L) 01/10/2021   HCT 30.9 (L) 01/10/2021   MCV 106.2 (H) 01/10/2021   PLT 149 (L) 01/10/2021   Lab Results  Component Value Date   NA 134 (L) 01/10/2021   K 2.9 (L) 01/10/2021   CO2 27 01/10/2021   GLUCOSE 77 01/10/2021   BUN 19 01/10/2021   CREATININE 1.34 (H) 01/10/2021   BILITOT 0.8 01/10/2021   ALKPHOS 67 01/10/2021   AST 25 01/10/2021   ALT 15 01/10/2021   PROT 7.4 01/10/2021   ALBUMIN 4.1 01/10/2021   CALCIUM 9.2 01/10/2021   ANIONGAP 12 01/10/2021   Lab Results  Component Value Date   CHOL 202 (H) 12/15/2019   Lab Results  Component Value Date   HDL 104 12/15/2019   Lab Results  Component Value Date   LDLCALC 87 12/15/2019   Lab Results  Component Value Date   TRIG 60 12/15/2019   Lab Results  Component Value Date   CHOLHDL 1.9 12/15/2019   Lab Results  Component Value Date   HGBA1C 5.9 (H) 10/04/2017      Assessment & Plan:   Problem List Items Addressed This Visit  Cardiovascular and Mediastinum   Primary hypertension - Primary     Patient denies any chest pain or shortness of breath there is no history of palpitation or paroxysmal nocturnal dyspnea   patient was advised to follow low-salt low-cholesterol diet    ideally I want to keep systolic blood pressure below 130 mmHg, patient was asked to check blood pressure one times a week and give me a report on that.  Patient will be follow-up in 3 months  or earlier as needed, patient will call me back for any change in the cardiovascular symptoms Patient was advised to buy a book from local bookstore concerning blood pressure and read several chapters  every day.  This will be supplemented by some of the material we will give him from the  office.  Patient should also utilize other resources like YouTube and Internet to learn more about the blood pressure and the diet.        Digestive   Gastroesophageal reflux disease without esophagitis    - The patient's GERD is stable on medication.  - Instructed the patient to avoid eating spicy and acidic foods, as well as foods high in fat. - Instructed the patient to avoid eating large meals or meals 2-3 hours prior to sleeping.        Musculoskeletal and Integument   Carcinoma of breast metastatic to bone [facility])    Refer to oncologist        Relevant Medications   ALPRAZolam (XANAX) 0.5 MG tablet     Other   Tachycardia    Stable      Anxiety   Relevant Medications   ALPRAZolam (XANAX) 0.5 MG tablet    Meds ordered this encounter  Medications   ALPRAZolam (XANAX) 0.5 MG tablet    Sig: Take 1 tablet (0.5 mg total) by mouth 2 (two) times daily.    Dispense:  60 tablet    Refill:  0    Follow-up: No follow-ups on file.    [name], MD

## 2021-01-24 NOTE — Assessment & Plan Note (Signed)
Stable

## 2021-02-02 NOTE — Progress Notes (Signed)
[place]  [phone] [phone]  ID: [id number] DONATE OB: [birth year]  MR#: [id number]  NOM#:[id number]  Patient Care Team: [name], [name], Harrell Gave, [name] Rico Junker, RN as Registered Nurse Lloyd Huger, MD as Consulting Physician (Oncology) Bary Castilla Forest Gleason, MD as Consulting Physician (General Surgery) Bary Castilla Forest Gleason, MD as Consulting Physician (General Surgery) Noreene Filbert, MD as Referring Physician (Radiation Oncology) Jeral Fruit, RN as Registered Nurse   CHIEF COMPLAINT: Recurrent stage IV ER/PR positive, 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  INTERVAL HISTORY: Patient returns to clinic today for further evaluation and continuation of Ibrance and Faslodex. She currently feels well and is asymptomatic.  She is tolerating her treatments well without significant side effects.  She does not complain of pain today.  She continues to have a mild peripheral neuropathy, but no other neurologic complaints.  She denies any chest pain, shortness of breath, cough, or hemoptysis.  She denies any nausea, vomiting, constipation, or diarrhea.  She has no urinary complaints.  Patient offers no further specific complaints today.  REVIEW OF SYSTEMS:   Review of Systems  Constitutional: Negative.  Negative for fever, malaise/fatigue and weight loss.  HENT:  Negative for congestion.   Respiratory: Negative.  Negative for cough and shortness of breath.   Cardiovascular: Negative.  Negative for chest pain and leg swelling.  Gastrointestinal: Negative.  Negative for abdominal pain, constipation, diarrhea and nausea.  Genitourinary: Negative.  Negative for dysuria and urgency.  Musculoskeletal: Negative.  Negative for back pain and joint pain.  Skin: Negative.  Negative for rash.  Neurological:  Positive for tingling and sensory change.  Negative for dizziness, focal weakness, weakness and headaches.  Psychiatric/Behavioral:  The patient is not nervous/anxious.    As per HPI. Otherwise, a complete review of systems is negative.  PAST MEDICAL HISTORY: Past Medical History:  Diagnosis Date   Anxiety    Breast cancer, left ([place]) 07/2017   Depression    Hypertension    Hypothyroidism    Personal history of chemotherapy 2019   LEFT mastectomy-chemo before   Personal history of radiation therapy 03/2018   LEFT mastectomy   Rapid heart rate    Thyroid disease     PAST SURGICAL HISTORY: Past Surgical History:  Procedure Laterality Date   AXILLARY LYMPH NODE BIOPSY Left 07/16/2017   METASTATIC MAMMARY CARCINOMA   BREAST BIOPSY Left 07/16/2017   [place] bx of left breast mass and left breast LN.  INVASIVE MAMMARY CARCINOMA, NO SPECIAL TYPE.    BREAST EXCISIONAL BIOPSY Right 2001   benign   BREAST LUMPECTOMY WITH SENTINEL LYMPH NODE BIOPSY Left 12/06/2017   Procedure: BREAST LUMPECTOMY WITH SENTINEL LYMPH NODE BX;  Surgeon: Robert Bellow, MD;  Location: [facility] ORS;  Service: General;  Laterality: Left;   COLONOSCOPY     MASTECTOMY Left 12/23/2017   PORTACATH PLACEMENT Right 08/07/2017   Procedure: INSERTION PORT-A-CATH;  Surgeon: Robert Bellow, MD;  Location: [facility] ORS;  Service: General;  Laterality: Right;   SIMPLE MASTECTOMY WITH AXILLARY SENTINEL NODE BIOPSY Left 12/23/2017   T2,N2 with 6/7 nodes positive. Whole breast radiation.  Surgeon: Robert Bellow, MD;  Location: [facility] ORS;  Service: General;  Laterality: Left;   TONSILLECTOMY      FAMILY HISTORY: Family History  Problem Relation Age of Onset   Stroke Mother    Thyroid disease Mother    Renal Disease  Mother    Stroke Father    Heart attack Father    Sudden death Father 41       suicide   Anuerysm Brother    Breast cancer Neg Hx     ADVANCED DIRECTIVES (Y/N):  N  HEALTH MAINTENANCE: Social History   Tobacco Use   Smoking status: Former     Packs/day: 1.00    Years: 30.00    Pack years: 30.00    Types: Cigarettes    Quit date: 2019    Years since quitting: 4.0   Smokeless tobacco: Never  Vaping Use   Vaping Use: Never used  Substance Use Topics   Alcohol use: Not Currently   Drug use: Never     Colonoscopy:  PAP:  Bone density:  Lipid panel:  Allergies  Allergen Reactions   Sulfa Antibiotics Diarrhea    Current Outpatient Medications  Medication Sig Dispense Refill   acetaminophen (TYLENOL) 500 MG tablet Take 1,000 mg by mouth every 6 (six) hours as needed for moderate pain or headache.      ALPRAZolam (XANAX) 0.5 MG tablet Take 1 tablet (0.5 mg total) by mouth 2 (two) times daily. 60 tablet 0   amLODipine (NORVASC) 5 MG tablet Take 1 tablet (5 mg total) by mouth daily. 90 tablet 3   aspirin EC 81 MG tablet Take 81 mg by mouth at bedtime.      calcium carbonate (OS-CAL - DOSED IN MG OF ELEMENTAL CALCIUM) 1250 (500 Ca) MG tablet Take 1 tablet by mouth daily with breakfast.     Cholecalciferol (VITAMIN D3) 125 MCG (5000 UT) CAPS Take 1 capsule by mouth 2 (two) times a day.     docusate sodium (COLACE) 100 MG capsule Take 100 mg by mouth 3 (three) times daily as needed for mild constipation.     hydrochlorothiazide (HYDRODIURIL) 12.5 MG tablet Take 1 tablet (12.5 mg total) by mouth daily. 90 tablet 3   levothyroxine (SYNTHROID) 100 MCG tablet TAKE 1 TABLET BY MOUTH DAILY 90 tablet 3   palbociclib (IBRANCE) 125 MG tablet Take 1 tablet (125 mg total) by mouth daily. Take for 21 days, then hold for 7 days. Repeat every 28 days. 21 tablet 3   potassium chloride SA (KLOR-CON M) 20 MEQ tablet Take 1 tablet (20 mEq total) by mouth 2 (two) times daily. 60 tablet 3   rosuvastatin (CRESTOR) 5 MG tablet TAKE 1 TABLET BY MOUTH DAILY 30 tablet 6   traZODone (DESYREL) 50 MG tablet TAKE 1 TABLET BY MOUTH DAILY 30 tablet 6   nystatin-lidocaine-prednisoLONE-diphenhydrAMINE-distilled water-alum & mag hydroxide-simeth Swish and  spit 5-79m four times a day as needed (Patient not taking: Reported on 02/07/2021) 480 mL 3   ondansetron (ZOFRAN) 8 MG tablet Take 1 tablet (8 mg total) by mouth every 8 (eight) hours as needed for nausea or vomiting. (Patient not taking: Reported on 02/07/2021) 20 tablet 0   No current facility-administered medications for this visit.    OBJECTIVE: Vitals:   02/07/21 1356  BP: 119/62  Pulse: 71  Resp: 16  Temp: 98.1 F (36.7 C)  SpO2: 98%     Body mass index is 33.98 kg/m.    ECOG FS:0 - Asymptomatic  General: Well-developed, well-nourished, no acute distress. Eyes: Pink conjunctiva, anicteric sclera. HEENT: Normocephalic, moist mucous membranes. Lungs: No audible wheezing or coughing. Heart: Regular rate and rhythm. Abdomen: Soft, nontender, no obvious distention. Musculoskeletal: No edema, cyanosis, or clubbing. Neuro: Alert, answering all questions appropriately. Cranial  nerves grossly intact. Skin: No rashes or petechiae noted. Psych: Normal affect.  LAB RESULTS:  Lab Results  Component Value Date   NA 135 02/07/2021   K 3.6 02/07/2021   CL 97 (L) 02/07/2021   CO2 24 02/07/2021   GLUCOSE 104 (H) 02/07/2021   BUN 20 02/07/2021   CREATININE 1.50 (H) 02/07/2021   CALCIUM 9.6 02/07/2021   PROT 7.9 02/07/2021   ALBUMIN 4.0 02/07/2021   AST 20 02/07/2021   ALT 11 02/07/2021   ALKPHOS 67 02/07/2021   BILITOT 0.8 02/07/2021   GFRNONAA 37 (L) 02/07/2021   GFRAA 57 (L) 10/01/2019    Lab Results  Component Value Date   WBC 1.6 (L) 02/07/2021   NEUTROABS 1.1 (L) 02/07/2021   HGB 9.8 (L) 02/07/2021   HCT 29.3 (L) 02/07/2021   MCV 105.4 (H) 02/07/2021   PLT 164 02/07/2021     STUDIES: No results found.   ONCOLOGY HISTORY: Patient initially received neoadjuvant chemotherapy with Adriamycin and Cytoxan followed by weekly Taxol. She only received 4 cycles of weekly Taxol prior to discontinuation of treatment on October 31, 2017 secondary to persistent peripheral  neuropathy.  She ultimately required mastectomy and final pathology noted 5 of 6 lymph nodes positive for disease.  Patient completed adjuvant XRT in mid April 2020.  Pain initiated letrozole in May 2020, this was discontinued secondary to side effects and patient was started on anastrozole.  Nuclear medicine bone scan on June 19, 2019 revealed metastatic disease and anastrozole was subsequently discontinued.  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  PLAN:    1. 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  MRI of the brain on August 24, 2019 reviewed independently with no obvious evidence of metastatic disease.  Repeat PET scan on November 10, 2020 reported essentially stable disease.  New lesion in her right mandible may be secondary to dental issues and not metastatic disease.  Patient's CA 27-29 initially increased to 141.9, but appears to have plateaued since November 2021 between 46.9 and 72.7.  She has since trended up and her most recent value is 87.6.    Despite persistent, mild neutropenia, will continue 125 mg Ibrance for 21 days with 7 days off.  Proceed with Faslodex today.  Patient only requires Xgeva every other month at this point.  Return to clinic in 4 weeks for repeat laboratory work and continuation of treatment.  We will repeat PET scan 1 to 2 days prior to next appointment.  Appreciate clinical pharmacy input. 2.  Peripheral neuropathy: Chronic and unchanged. 3.  History of pulmonary embolus: Patient was diagnosed with a small pulmonary embolus on January 10, 2018.  She is no longer on anticoagulation.  There was no evidence of recurrent PE on CT scan from October 27, 2019. 4.  Osteopenia: Patient's most recent bone mineral density on March 03, 2019 reported a T score of -1.8 which is only mildly decreased from 1 year prior when the reported T score was -1.6.  Continue calcium and vitamin D supplementation.  Xgeva as above. 5.  Left hip pain: Patient does not complain of this today.  Continue symptomatic treatment.  Patient has had XRT to this area. 6.  Leukopenia: Chronic and unchanged.  Patient's total white blood cell count is 1.6 today.  This is  ranged between 1.6 and 3.2 since July 2021.   7.  Anemia: Chronic and unchanged.  Patient's hemoglobin is 9.8.   8.  Renal insufficiency: Chronic and unchanged.  Patient's creatinine has trended up slightly to 1.5.   9.  Hypokalemia: Resolved.  Continue oral potassium supplementation.  Patient expressed understanding and was in agreement with this plan. She also understands that She can call clinic at any time with any questions, concerns, or complaints.    Cancer Staging  Breast cancer, stage 2, left ([place]) Staging form: Breast, AJCC 8th Edition - Clinical stage from 07/30/2017: Stage IIA (cT2, cN1, cM0, G2, ER+, PR+, HER2-) - Signed by Lloyd Huger, MD on 07/30/2017 Histologic grading system: 3 grade system Laterality: Left   Lloyd Huger, MD   02/08/2021 6:53 PM

## 2021-02-06 ENCOUNTER — Other Ambulatory Visit (HOSPITAL_COMMUNITY): Payer: Self-pay

## 2021-02-07 ENCOUNTER — Inpatient Hospital Stay: Payer: Medicare Other | Admitting: Pharmacist

## 2021-02-07 ENCOUNTER — Inpatient Hospital Stay: Payer: Medicare Other

## 2021-02-07 ENCOUNTER — Inpatient Hospital Stay: Payer: Medicare Other | Attending: Oncology

## 2021-02-07 ENCOUNTER — Other Ambulatory Visit (HOSPITAL_COMMUNITY): Payer: Self-pay

## 2021-02-07 ENCOUNTER — Inpatient Hospital Stay (HOSPITAL_BASED_OUTPATIENT_CLINIC_OR_DEPARTMENT_OTHER): Payer: Medicare Other | Admitting: Oncology

## 2021-02-07 ENCOUNTER — Other Ambulatory Visit: Payer: Self-pay

## 2021-02-07 VITALS — BP 119/62 | HR 71 | Temp 98.1°F | Resp 16 | Wt 210.5 lb

## 2021-02-07 DIAGNOSIS — Z79899 Other long term (current) drug therapy: Secondary | ICD-10-CM | POA: Insufficient documentation

## 2021-02-07 DIAGNOSIS — C50912 Malignant neoplasm of unspecified site of left female breast: Secondary | ICD-10-CM | POA: Insufficient documentation

## 2021-02-07 DIAGNOSIS — Z5111 Encounter for antineoplastic chemotherapy: Secondary | ICD-10-CM | POA: Insufficient documentation

## 2021-02-07 DIAGNOSIS — C7951 Secondary malignant neoplasm of bone: Secondary | ICD-10-CM | POA: Insufficient documentation

## 2021-02-07 DIAGNOSIS — C50919 Malignant neoplasm of unspecified site of unspecified female breast: Secondary | ICD-10-CM

## 2021-02-07 LAB — CBC WITH DIFFERENTIAL/PLATELET
Abs Immature Granulocytes: 0.01 10*3/uL (ref 0.00–0.07)
Basophils Absolute: 0 10*3/uL (ref 0.0–0.1)
Basophils Relative: 1 %
Eosinophils Absolute: 0 10*3/uL (ref 0.0–0.5)
Eosinophils Relative: 1 %
HCT: 29.3 % — ABNORMAL LOW (ref 36.0–46.0)
Hemoglobin: 9.8 g/dL — ABNORMAL LOW (ref 12.0–15.0)
Immature Granulocytes: 1 %
Lymphocytes Relative: 16 %
Lymphs Abs: 0.3 10*3/uL — ABNORMAL LOW (ref 0.7–4.0)
MCH: 35.3 pg — ABNORMAL HIGH (ref 26.0–34.0)
MCHC: 33.4 g/dL (ref 30.0–36.0)
MCV: 105.4 fL — ABNORMAL HIGH (ref 80.0–100.0)
Monocytes Absolute: 0.2 10*3/uL (ref 0.1–1.0)
Monocytes Relative: 10 %
Neutro Abs: 1.1 10*3/uL — ABNORMAL LOW (ref 1.7–7.7)
Neutrophils Relative %: 71 %
Platelets: 164 10*3/uL (ref 150–400)
RBC: 2.78 MIL/uL — ABNORMAL LOW (ref 3.87–5.11)
RDW: 14.4 % (ref 11.5–15.5)
Smear Review: NORMAL
WBC: 1.6 10*3/uL — ABNORMAL LOW (ref 4.0–10.5)
nRBC: 0 % (ref 0.0–0.2)

## 2021-02-07 LAB — COMPREHENSIVE METABOLIC PANEL
ALT: 11 U/L (ref 0–44)
AST: 20 U/L (ref 15–41)
Albumin: 4 g/dL (ref 3.5–5.0)
Alkaline Phosphatase: 67 U/L (ref 38–126)
Anion gap: 14 (ref 5–15)
BUN: 20 mg/dL (ref 8–23)
CO2: 24 mmol/L (ref 22–32)
Calcium: 9.6 mg/dL (ref 8.9–10.3)
Chloride: 97 mmol/L — ABNORMAL LOW (ref 98–111)
Creatinine, Ser: 1.5 mg/dL — ABNORMAL HIGH (ref 0.44–1.00)
GFR, Estimated: 37 mL/min — ABNORMAL LOW (ref >60)
Glucose, Bld: 104 mg/dL — ABNORMAL HIGH (ref 70–99)
Potassium: 3.6 mmol/L (ref 3.5–5.1)
Sodium: 135 mmol/L (ref 135–145)
Total Bilirubin: 0.8 mg/dL (ref 0.3–1.2)
Total Protein: 7.9 g/dL (ref 6.5–8.1)

## 2021-02-07 MED ORDER — FULVESTRANT 250 MG/5ML IM SOSY
500.0000 mg | PREFILLED_SYRINGE | Freq: Once | INTRAMUSCULAR | Status: AC
  Administered 2021-02-07: 15:00:00 500 mg via INTRAMUSCULAR
  Filled 2021-02-07: qty 10

## 2021-02-07 NOTE — Progress Notes (Signed)
[place]  [phone]) [phone] [phone]) [phone]  Patient Care Team: [name], [name], Harrell Gave, [name] Rico Junker, RN as Registered Nurse Grayland Ormond Kathlene November, MD as Consulting Physician (Oncology) Bary Castilla Forest Gleason, MD as Consulting Physician (General Surgery) Bary Castilla Forest Gleason, MD as Consulting Physician (General Surgery) Noreene Filbert, MD as Referring Physician (Radiation Oncology) Jeral Fruit, RN as Registered Nurse   Name of the patient: Alicia [name]  [id number]  March 30, 1950   Date of visit: 02/07/21  HPI: Patient is a 71 y.o. female with recurrent stage IV ER/PR positive, HER2 negative breast cancer. Patient started on Ibrance on 07/09/19.  Reason for Consult: Oral chemotherapy follow-up for Ibrance (palbociclib) therapy.   PAST MEDICAL HISTORY: Past Medical History:  Diagnosis Date   Anxiety    Breast cancer, left ([place]) 07/2017   Depression    Hypertension    Hypothyroidism    Personal history of chemotherapy 2019   LEFT mastectomy-chemo before   Personal history of radiation therapy 03/2018   LEFT mastectomy   Rapid heart rate    Thyroid disease     PAST SURGICAL HISTORY:  Past Surgical History:  Procedure Laterality Date   AXILLARY LYMPH NODE BIOPSY Left 07/16/2017   METASTATIC MAMMARY CARCINOMA   BREAST BIOPSY Left 07/16/2017   [place] bx of left breast mass and left breast LN.  INVASIVE MAMMARY CARCINOMA, NO SPECIAL TYPE.    BREAST EXCISIONAL BIOPSY Right 2001   benign   BREAST LUMPECTOMY WITH SENTINEL LYMPH NODE BIOPSY Left 12/06/2017   Procedure: BREAST LUMPECTOMY WITH SENTINEL LYMPH NODE BX;  Surgeon: Robert Bellow, MD;  Location: [facility] ORS;  Service: General;  Laterality: Left;   COLONOSCOPY     MASTECTOMY Left 12/23/2017   PORTACATH PLACEMENT Right 08/07/2017   Procedure: INSERTION PORT-A-CATH;  Surgeon:  Robert Bellow, MD;  Location: [facility] ORS;  Service: General;  Laterality: Right;   SIMPLE MASTECTOMY WITH AXILLARY SENTINEL NODE BIOPSY Left 12/23/2017   T2,N2 with 6/7 nodes positive. Whole breast radiation.  Surgeon: Robert Bellow, MD;  Location: [facility] ORS;  Service: General;  Laterality: Left;   TONSILLECTOMY      HEMATOLOGY/ONCOLOGY HISTORY:  Oncology History Overview Note  Patient is a 71 year old female who recently self palpated a mass on her left breast.  Subsequent imaging and biopsy revealed the above-stated breast cancer.  Case was also discussed extensively at case conference.  Given the size and the stage of patient's malignancy, she will benefit from neoadjuvant chemotherapy using Adriamycin, Cytoxan, and Taxol.  Patient will also require Neulasta support.  Will get CT scan of the chest, abdomen, and pelvis to assess for any metastatic disease.  Patient will also require port placement and MUGA prior to initiating treatment  CT abdomen/pelvis/chest did not reveal any suspicious lesions concerning for metastatic disease. (08/01/17)  Port-A-Cath placed on 08/07/2017.  Cycle 1 day 1 of AC was given on 08/08/17.     Breast cancer, stage 2, left ([place])  07/24/2017 Initial Diagnosis   Breast cancer, stage 2, left ([place])   07/30/2017 Cancer Staging   Staging form: Breast, AJCC 8th Edition - Clinical stage from 07/30/2017: Stage IIA (cT2, cN1, cM0, G2, ER+, PR+, HER2-) - Signed by Lloyd Huger, MD on 07/30/2017    08/08/2017 - 10/31/2017 Chemotherapy   The patient had DOXOrubicin (ADRIAMYCIN) chemo injection 130 mg, 60 mg/m2 = 130 mg, Intravenous,  Once, 4 of 4 cycles Administration: 130 mg (08/08/2017), 130 mg (08/22/2017), 130 mg (09/05/2017), 130 mg (09/19/2017) palonosetron (ALOXI) injection 0.25 mg, 0.25 mg, Intravenous,  Once, 4 of 4 cycles Administration: 0.25 mg (08/08/2017), 0.25 mg (08/22/2017), 0.25 mg (09/05/2017), 0.25 mg (09/19/2017) pegfilgrastim-cbqv (UDENYCA) injection 6  mg, 6 mg, Subcutaneous, Once, 4 of 4 cycles Administration: 6 mg (08/09/2017), 6 mg (08/23/2017), 6 mg (09/06/2017), 6 mg (09/20/2017) cyclophosphamide (CYTOXAN) 1,300 mg in sodium chloride 0.9 % 250 mL chemo infusion, 600 mg/m2 = 1,300 mg, Intravenous,  Once, 4 of 4 cycles Administration: 1,300 mg (08/08/2017), 1,300 mg (08/22/2017), 1,300 mg (09/05/2017), 1,300 mg (09/19/2017) PACLitaxel (TAXOL) 174 mg in sodium chloride 0.9 % 250 mL chemo infusion (</= 43m/m2), 80 mg/m2 = 174 mg, Intravenous,  Once, 4 of 12 cycles Dose modification: 72 mg/m2 (original dose 80 mg/m2, Cycle 6, Reason: Dose not tolerated) Administration: 174 mg (10/03/2017), 156 mg (10/17/2017), 156 mg (10/24/2017), 156 mg (10/31/2017) fosaprepitant (EMEND) 150 mg, dexamethasone (DECADRON) 12 mg in sodium chloride 0.9 % 145 mL IVPB, , Intravenous,  Once, 4 of 4 cycles Administration:  (08/08/2017),  (08/22/2017),  (09/05/2017),  (09/19/2017)   for chemotherapy treatment.       ALLERGIES:  is allergic to sulfa antibiotics.  MEDICATIONS:  Current Outpatient Medications  Medication Sig Dispense Refill   acetaminophen (TYLENOL) 500 MG tablet Take 1,000 mg by mouth every 6 (six) hours as needed for moderate pain or headache.      ALPRAZolam (XANAX) 0.5 MG tablet Take 1 tablet (0.5 mg total) by mouth 2 (two) times daily. 60 tablet 0   amLODipine (NORVASC) 5 MG tablet Take 1 tablet (5 mg total) by mouth daily. 90 tablet 3   aspirin EC 81 MG tablet Take 81 mg by mouth at bedtime.      calcium carbonate (OS-CAL - DOSED IN MG OF ELEMENTAL CALCIUM) 1250 (500 Ca) MG tablet Take 1 tablet by mouth daily with breakfast.     Cholecalciferol (VITAMIN D3) 125 MCG (5000 UT) CAPS Take 1 capsule by mouth 2 (two) times a day.     docusate sodium (COLACE) 100 MG capsule Take 100 mg by mouth 3 (three) times daily as needed for mild constipation.     hydrochlorothiazide (HYDRODIURIL) 12.5 MG tablet Take 1 tablet (12.5 mg total) by mouth daily. 90 tablet 3    levothyroxine (SYNTHROID) 100 MCG tablet TAKE 1 TABLET BY MOUTH DAILY 90 tablet 3   nystatin-lidocaine-prednisoLONE-diphenhydrAMINE-distilled water-alum & mag hydroxide-simeth Swish and spit 5-153mfour times a day as needed (Patient not taking: Reported on 02/07/2021) 480 mL 3   ondansetron (ZOFRAN) 8 MG tablet Take 1 tablet (8 mg total) by mouth every 8 (eight) hours as needed for nausea or vomiting. (Patient not taking: Reported on 02/07/2021) 20 tablet 0   palbociclib (IBRANCE) 125 MG tablet Take 1 tablet (125 mg total) by mouth daily. Take for 21 days, then hold for 7 days. Repeat every 28 days. 21 tablet 3   potassium chloride SA (KLOR-CON M) 20 MEQ tablet Take 1 tablet (20 mEq total) by mouth 2 (two) times daily. 60 tablet 3   rosuvastatin (CRESTOR) 5 MG tablet TAKE 1 TABLET BY MOUTH DAILY 30 tablet 6   traZODone (DESYREL) 50 MG tablet TAKE 1 TABLET BY MOUTH DAILY 30 tablet 6   No current facility-administered medications for this visit.    VITAL SIGNS: There were no vitals taken for this visit. There were no vitals filed for this visit.  Estimated  body mass index is 33.98 kg/m as calculated from the following:   Height as of 01/24/21: 5' 6" (1.676 m).   Weight as of an earlier encounter on 02/07/21: 95.5 kg (210 lb 8 oz).  LABS: CBC:    Component Value Date/Time   WBC 1.6 (L) 02/07/2021 1329   HGB 9.8 (L) 02/07/2021 1329   HGB 14.2 02/11/2011 1349   HCT 29.3 (L) 02/07/2021 1329   HCT 40.9 02/11/2011 1349   PLT 164 02/07/2021 1329   PLT 151 02/11/2011 1349   MCV 105.4 (H) 02/07/2021 1329   MCV 89 02/11/2011 1349   NEUTROABS 1.1 (L) 02/07/2021 1329   LYMPHSABS 0.3 (L) 02/07/2021 1329   MONOABS 0.2 02/07/2021 1329   EOSABS 0.0 02/07/2021 1329   BASOSABS 0.0 02/07/2021 1329   Comprehensive Metabolic Panel:    Component Value Date/Time   NA 135 02/07/2021 1329   NA 142 02/11/2011 1349   K 3.6 02/07/2021 1329   K 3.8 02/11/2011 1349   CL 97 (L) 02/07/2021 1329   CL 107  02/11/2011 1349   CO2 24 02/07/2021 1329   CO2 24 02/11/2011 1349   BUN 20 02/07/2021 1329   BUN 14 02/11/2011 1349   CREATININE 1.50 (H) 02/07/2021 1329   CREATININE 0.65 02/11/2011 1349   GLUCOSE 104 (H) 02/07/2021 1329   GLUCOSE 98 02/11/2011 1349   CALCIUM 9.6 02/07/2021 1329   CALCIUM 9.0 02/11/2011 1349   AST 20 02/07/2021 1329   AST 27 02/11/2011 1349   ALT 11 02/07/2021 1329   ALT 36 02/11/2011 1349   ALKPHOS 67 02/07/2021 1329   ALKPHOS 122 02/11/2011 1349   BILITOT 0.8 02/07/2021 1329   BILITOT 0.5 02/11/2011 1349   PROT 7.9 02/07/2021 1329   PROT 7.0 02/11/2011 1349   ALBUMIN 4.0 02/07/2021 1329   ALBUMIN 4.1 02/11/2011 1349    RADIOGRAPHIC STUDIES: No results found.   Assessment and Plan-  Reviewed CBC and CMP, continue Ibrance 142m 21 days on/7 days off. Potassium has improved with supplementation since last office visit  Patient will have imaging repeated prior to next visit.   Oral Chemotherapy Side Effect/Intolerance:  Fatigue: Patient reported an increase in fatigue since her last visit Decreased appetite: Patient reported a decrease in appetite since her last visit, reviewing her weight in Epic patient has lost 15lbs in the last 4 months No reported constipation or diarrhea, nausea, or mouth sores  Oral Chemotherapy Adherence: No reported missed doses  Medication Access Issues: no issues, fills her Ibrance at [facility] They called her today to set-up delivery  Patient expressed understanding and was in agreement with this plan. She also understands that She can call clinic at any time with any questions, concerns, or complaints.   Thank you for allowing me to participate in the care of this very pleasant patient.   Time Total: 15 mins  Visit consisted of counseling and education on dealing with issues of symptom management in the setting of serious and potentially life-threatening illness.Greater than 50%  of this time was spent  counseling and coordinating care related to the above assessment and plan.  Signed by: ADarl Pikes PharmD, BCPS, BSalley Slaughter CPP Hematology/Oncology Clinical Pharmacist Practitioner [phone] 02/07/2021 4:40 PM

## 2021-02-08 ENCOUNTER — Encounter: Payer: Self-pay | Admitting: Oncology

## 2021-02-08 LAB — CANCER ANTIGEN 27.29: CA 27.29: 87.6 U/mL — ABNORMAL HIGH (ref 0.0–38.6)

## 2021-02-13 ENCOUNTER — Other Ambulatory Visit (HOSPITAL_COMMUNITY): Payer: Self-pay

## 2021-02-15 ENCOUNTER — Other Ambulatory Visit (HOSPITAL_COMMUNITY): Payer: Self-pay

## 2021-02-15 ENCOUNTER — Encounter: Payer: Self-pay | Admitting: Oncology

## 2021-02-15 ENCOUNTER — Telehealth: Payer: Self-pay | Admitting: Pharmacy Technician

## 2021-02-15 NOTE — Telephone Encounter (Signed)
Oral Oncology Patient Advocate Encounter   Was successful in securing patient an $74 grant from Patient [facility]) to provide copayment coverage for Ibrance.  This will keep the out of pocket expense at $0.     I have spoken with the patient.    The billing information is as follows and has been shared with [place].   Member ID: [id number] Group ID: [id number] RxBin: [id number] Dates of Eligibility: 11/17/20 through 02/14/22  Fund:  [place] Patient [place] Phone [phone] Fax [phone] 02/15/2021 2:25 PM

## 2021-02-16 ENCOUNTER — Telehealth: Payer: Self-pay | Admitting: Pharmacy Technician

## 2021-02-20 NOTE — Telephone Encounter (Signed)
Oral Oncology Patient Advocate Encounter  Was successful in securing patient a $15000 grant from [employer] to provide copayment coverage for [employer].    [employer] ID: [id number]  Dates of Eligibility: 01/17/21 through 01/16/22  Physician Diagnosis Verification form must be submitter for final approval. Form is pending MD signature.  Fund:  Breast Cancer-Medicare Premium assistance  [place] Patient [place] Phone [phone] Fax [phone] 02/20/2021 3:14 PM

## 2021-03-01 ENCOUNTER — Ambulatory Visit (INDEPENDENT_AMBULATORY_CARE_PROVIDER_SITE_OTHER): Payer: Medicare Other | Admitting: Internal Medicine

## 2021-03-01 ENCOUNTER — Other Ambulatory Visit: Payer: Self-pay | Admitting: *Deleted

## 2021-03-01 ENCOUNTER — Other Ambulatory Visit: Payer: Self-pay

## 2021-03-01 ENCOUNTER — Encounter: Payer: Self-pay | Admitting: Internal Medicine

## 2021-03-01 VITALS — BP 140/78 | HR 72 | Ht 66.0 in | Wt 201.2 lb

## 2021-03-01 DIAGNOSIS — N390 Urinary tract infection, site not specified: Secondary | ICD-10-CM | POA: Diagnosis not present

## 2021-03-01 DIAGNOSIS — E669 Obesity, unspecified: Secondary | ICD-10-CM

## 2021-03-01 DIAGNOSIS — I10 Essential (primary) hypertension: Secondary | ICD-10-CM

## 2021-03-01 DIAGNOSIS — C50919 Malignant neoplasm of unspecified site of unspecified female breast: Secondary | ICD-10-CM

## 2021-03-01 DIAGNOSIS — F419 Anxiety disorder, unspecified: Secondary | ICD-10-CM

## 2021-03-01 DIAGNOSIS — C7951 Secondary malignant neoplasm of bone: Secondary | ICD-10-CM

## 2021-03-01 DIAGNOSIS — K219 Gastro-esophageal reflux disease without esophagitis: Secondary | ICD-10-CM

## 2021-03-01 LAB — POCT URINALYSIS DIPSTICK
Bilirubin, UA: NEGATIVE
Blood, UA: POSITIVE
Glucose, UA: NEGATIVE
Ketones, UA: NEGATIVE
Nitrite, UA: NEGATIVE
Protein, UA: POSITIVE — AB
Spec Grav, UA: 1.025 (ref 1.010–1.025)
Urobilinogen, UA: 0.2 E.U./dL
pH, UA: 6 (ref 5.0–8.0)

## 2021-03-01 MED ORDER — CIPROFLOXACIN HCL 500 MG PO TABS: 500.0000 mg | ORAL_TABLET | Freq: Two times a day (BID) | ORAL | 0 refills | Status: DC

## 2021-03-01 NOTE — Assessment & Plan Note (Signed)
Patient was started on Cipro

## 2021-03-01 NOTE — Assessment & Plan Note (Signed)
-   The patient's GERD is stable on medication.  - Instructed the patient to avoid eating spicy and acidic foods, as well as foods high in fat. - Instructed the patient to avoid eating large meals or meals 2-3 hours prior to sleeping. 

## 2021-03-01 NOTE — Progress Notes (Signed)
Established Patient Office Visit  Subjective:  Patient ID: Alicia [name], female    DOB: 1950/04/12  Age: 71 y.o. MRN: [id number]  CC:  Chief Complaint  Patient presents with   Urinary Tract Infection    Urinary Tract Infection  This is a new problem. The current episode started in the past 7 days. The quality of the pain is described as burning. The pain is at a severity of 2/10. She is Not sexually active. There is No history of pyelonephritis. Associated symptoms include flank pain, hesitancy and urgency. Pertinent negatives include no chills, nausea or vomiting.   Connye Burkitt [facility] presents for uti  Past Medical History:  Diagnosis Date   Anxiety    Breast cancer, left ([place]) 07/2017   Depression    Hypertension    Hypothyroidism    Personal history of chemotherapy 2019   LEFT mastectomy-chemo before   Personal history of radiation therapy 03/2018   LEFT mastectomy   Rapid heart rate    Thyroid disease     Past Surgical History:  Procedure Laterality Date   AXILLARY LYMPH NODE BIOPSY Left 07/16/2017   METASTATIC MAMMARY CARCINOMA   BREAST BIOPSY Left 07/16/2017   [place] bx of left breast mass and left breast LN.  INVASIVE MAMMARY CARCINOMA, NO SPECIAL TYPE.    BREAST EXCISIONAL BIOPSY Right 2001   benign   BREAST LUMPECTOMY WITH SENTINEL LYMPH NODE BIOPSY Left 12/06/2017   Procedure: BREAST LUMPECTOMY WITH SENTINEL LYMPH NODE BX;  Surgeon: Robert Bellow, MD;  Location: [facility] ORS;  Service: General;  Laterality: Left;   COLONOSCOPY     MASTECTOMY Left 12/23/2017   PORTACATH PLACEMENT Right 08/07/2017   Procedure: INSERTION PORT-A-CATH;  Surgeon: Robert Bellow, MD;  Location: [facility] ORS;  Service: General;  Laterality: Right;   SIMPLE MASTECTOMY WITH AXILLARY SENTINEL NODE BIOPSY Left 12/23/2017   T2,N2 with 6/7 nodes positive. Whole breast radiation.  Surgeon: Robert Bellow, MD;  Location: [facility] ORS;  Service: General;  Laterality: Left;   TONSILLECTOMY       Family History  Problem Relation Age of Onset   Stroke Mother    Thyroid disease Mother    Renal Disease Mother    Stroke Father    Heart attack Father    Sudden death Father 87       suicide   Anuerysm Brother    Breast cancer Neg Hx     Social History   Socioeconomic History   Marital status: Widowed    Spouse name: Not on file   Number of children: Not on file   Years of education: Not on file   Highest education level: Not on file  Occupational History   Not on file  Tobacco Use   Smoking status: Former    Packs/day: 1.00    Years: 30.00    Pack years: 30.00    Types: Cigarettes    Quit date: 2019    Years since quitting: 4.1   Smokeless tobacco: Never  Vaping Use   Vaping Use: Never used  Substance and Sexual Activity   Alcohol use: Not Currently   Drug use: Never   Sexual activity: Not Currently  Other Topics Concern   Not on file  Social History Narrative   Not on file   Social Determinants of Health   Financial Resource Strain: Low Risk    Difficulty of Paying Living Expenses: Not very hard  Food Insecurity: Food Insecurity Present   Worried  About Running Out of Food in the Last Year: Sometimes true   Ran Out of Food in the Last Year: Never true  Transportation Needs: No Transportation Needs   Lack of Transportation (Medical): No   Lack of Transportation (Non-Medical): No  Physical Activity: Insufficiently Active   Days of Exercise per Week: 2 days   Minutes of Exercise per Session: 20 min  Stress: No Stress Concern Present   Feeling of Stress : Not at all  Social Connections: Socially Isolated   Frequency of Communication with Friends and Family: Twice a week   Frequency of Social Gatherings with Friends and Family: Twice a week   Attends Religious Services: Never   Marine scientist or Organizations: No   Attends Archivist Meetings: Never   Marital Status: Widowed  Human resources officer Violence: Not At Risk   Fear of  Current or Ex-Partner: No   Emotionally Abused: No   Physically Abused: No   Sexually Abused: No     Current Outpatient Medications:    acetaminophen (TYLENOL) 500 MG tablet, Take 1,000 mg by mouth every 6 (six) hours as needed for moderate pain or headache. , Disp: , Rfl:    ALPRAZolam (XANAX) 0.5 MG tablet, Take 1 tablet (0.5 mg total) by mouth 2 (two) times daily., Disp: 60 tablet, Rfl: 0   amLODipine (NORVASC) 5 MG tablet, Take 1 tablet (5 mg total) by mouth daily., Disp: 90 tablet, Rfl: 3   aspirin EC 81 MG tablet, Take 81 mg by mouth at bedtime. , Disp: , Rfl:    calcium carbonate (OS-CAL - DOSED IN MG OF ELEMENTAL CALCIUM) 1250 (500 Ca) MG tablet, Take 1 tablet by mouth daily with breakfast., Disp: , Rfl:    Cholecalciferol (VITAMIN D3) 125 MCG (5000 UT) CAPS, Take 1 capsule by mouth 2 (two) times a day., Disp: , Rfl:    docusate sodium (COLACE) 100 MG capsule, Take 100 mg by mouth 3 (three) times daily as needed for mild constipation., Disp: , Rfl:    hydrochlorothiazide (HYDRODIURIL) 12.5 MG tablet, Take 1 tablet (12.5 mg total) by mouth daily., Disp: 90 tablet, Rfl: 3   levothyroxine (SYNTHROID) 100 MCG tablet, TAKE 1 TABLET BY MOUTH DAILY, Disp: 90 tablet, Rfl: 3   nystatin-lidocaine-prednisoLONE-diphenhydrAMINE-distilled water-alum & mag hydroxide-simeth, Swish and spit 5-78ml four times a day as needed (Patient not taking: Reported on 02/07/2021), Disp: 480 mL, Rfl: 3   ondansetron (ZOFRAN) 8 MG tablet, Take 1 tablet (8 mg total) by mouth every 8 (eight) hours as needed for nausea or vomiting. (Patient not taking: Reported on 02/07/2021), Disp: 20 tablet, Rfl: 0   palbociclib (IBRANCE) 125 MG tablet, Take 1 tablet (125 mg total) by mouth daily. Take for 21 days, then hold for 7 days. Repeat every 28 days., Disp: 21 tablet, Rfl: 3   potassium chloride SA (KLOR-CON M) 20 MEQ tablet, Take 1 tablet (20 mEq total) by mouth 2 (two) times daily., Disp: 60 tablet, Rfl: 3   rosuvastatin  (CRESTOR) 5 MG tablet, TAKE 1 TABLET BY MOUTH DAILY, Disp: 30 tablet, Rfl: 6   traZODone (DESYREL) 50 MG tablet, TAKE 1 TABLET BY MOUTH DAILY, Disp: 30 tablet, Rfl: 6   Allergies  Allergen Reactions   Sulfa Antibiotics Diarrhea    ROS Review of Systems  Constitutional:  Negative for chills.  Gastrointestinal:  Negative for nausea and vomiting.  Genitourinary:  Positive for flank pain, hesitancy and urgency.     Objective:  Physical Exam Vitals reviewed.  Constitutional:      Appearance: Normal appearance.  HENT:     Mouth/Throat:     Mouth: Mucous membranes are moist.  Eyes:     Pupils: Pupils are equal, round, and reactive to light.  Neck:     Vascular: No carotid bruit.  Cardiovascular:     Rate and Rhythm: Normal rate and regular rhythm.     Pulses: Normal pulses.     Heart sounds: Normal heart sounds.  Pulmonary:     Effort: Pulmonary effort is normal.     Breath sounds: Normal breath sounds.  Abdominal:     General: Bowel sounds are normal.     Palpations: Abdomen is soft. There is no hepatomegaly, splenomegaly or mass.     Tenderness: There is no abdominal tenderness.     Hernia: No hernia is present.  Musculoskeletal:        General: No tenderness.     Cervical back: Neck supple.     Right lower leg: No edema.     Left lower leg: No edema.  Skin:    Findings: No rash.  Neurological:     Mental Status: She is alert and oriented to person, place, and time.     Motor: No weakness.  Psychiatric:        Mood and Affect: Mood and affect normal.        Behavior: Behavior normal.    BP 140/78    Pulse 72    Ht 5\' 6"  (1.676 m)    Wt 201 lb 3.2 oz (91.3 kg)    BMI 32.47 kg/m  Wt Readings from Last 3 Encounters:  03/01/21 201 lb 3.2 oz (91.3 kg)  02/07/21 210 lb 8 oz (95.5 kg)  01/24/21 212 lb 3.2 oz (96.3 kg)     Health Maintenance Due  Topic Date Due   Hepatitis C Screening  Never done   Zoster Vaccines- Shingrix (1 of 2) Never done   COLONOSCOPY  (Pts 45-51yrs Insurance coverage will need to be confirmed)  Never done   Pneumonia Vaccine 79+ Years old (1 - PCV) Never done   COVID-19 Vaccine (3 - Pfizer risk series) 06/30/2019   TETANUS/TDAP  09/13/2020    There are no preventive care reminders to display for this patient.  Lab Results  Component Value Date   TSH 0.925 10/27/2019   Lab Results  Component Value Date   WBC 1.6 (L) 02/07/2021   HGB 9.8 (L) 02/07/2021   HCT 29.3 (L) 02/07/2021   MCV 105.4 (H) 02/07/2021   PLT 164 02/07/2021   Lab Results  Component Value Date   NA 135 02/07/2021   K 3.6 02/07/2021   CO2 24 02/07/2021   GLUCOSE 104 (H) 02/07/2021   BUN 20 02/07/2021   CREATININE 1.50 (H) 02/07/2021   BILITOT 0.8 02/07/2021   ALKPHOS 67 02/07/2021   AST 20 02/07/2021   ALT 11 02/07/2021   PROT 7.9 02/07/2021   ALBUMIN 4.0 02/07/2021   CALCIUM 9.6 02/07/2021   ANIONGAP 14 02/07/2021   Lab Results  Component Value Date   CHOL 202 (H) 12/15/2019   Lab Results  Component Value Date   HDL 104 12/15/2019   Lab Results  Component Value Date   LDLCALC 87 12/15/2019   Lab Results  Component Value Date   TRIG 60 12/15/2019   Lab Results  Component Value Date   CHOLHDL 1.9 12/15/2019   Lab Results  Component Value  Date   HGBA1C 5.9 (H) 10/04/2017      Assessment & Plan:   Problem List Items Addressed This Visit       Cardiovascular and Mediastinum   Primary hypertension     Patient denies any chest pain or shortness of breath there is no history of palpitation or paroxysmal nocturnal dyspnea   patient was advised to follow low-salt low-cholesterol diet    ideally I want to keep systolic blood pressure below 130 mmHg, patient was asked to check blood pressure one times a week and give me a report on that.  Patient will be follow-up in 3 months  or earlier as needed, patient will call me back for any change in the cardiovascular symptoms Patient was advised to buy a book from local  bookstore concerning blood pressure and read several chapters  every day.  This will be supplemented by some of the material we will give him from the office.  Patient should also utilize other resources like YouTube and Internet to learn more about the blood pressure and the diet.        Digestive   Gastroesophageal reflux disease without esophagitis    - The patient's GERD is stable on medication.  - Instructed the patient to avoid eating spicy and acidic foods, as well as foods high in fat. - Instructed the patient to avoid eating large meals or meals 2-3 hours prior to sleeping.        Musculoskeletal and Integument   Carcinoma of breast metastatic to bone [facility])    Patient is going to have a PET scan tomorrow        Genitourinary   Urinary tract infection without hematuria - Primary    Patient was started on Cipro      Relevant Orders   POCT urinalysis dipstick (Completed)     Other   Anxiety    - Patient experiencing high levels of anxiety.  - Encouraged patient to engage in relaxing activities like yoga, meditation, journaling, going for a walk, or participating in a hobby.  - Encouraged patient to reach out to trusted friends or family members about recent struggles, Patient was advised to read A book, how to stop worrying and start living, it is good book to read to control  the stress       Obesity (BMI 35.0-39.9 without comorbidity)    No orders of the defined types were placed in this encounter.   Follow-up: No follow-ups on file.    [name], MD

## 2021-03-01 NOTE — Assessment & Plan Note (Signed)
Patient is going to have a PET scan tomorrow

## 2021-03-01 NOTE — Assessment & Plan Note (Signed)

## 2021-03-01 NOTE — Assessment & Plan Note (Signed)
-   Patient experiencing high levels of anxiety.  - Encouraged patient to engage in relaxing activities like yoga, meditation, journaling, going for a walk, or participating in a hobby.  - Encouraged patient to reach out to trusted friends or family members about recent struggles, Patient was advised to read A book, how to stop worrying and start living, it is good book to read to control  the stress  

## 2021-03-02 ENCOUNTER — Encounter
Admission: RE | Admit: 2021-03-02 | Discharge: 2021-03-02 | Disposition: A | Discharge status: Home / Self Care | Payer: Medicare Other | Source: Ambulatory Visit | Attending: Oncology | Admitting: Oncology

## 2021-03-02 DIAGNOSIS — C7951 Secondary malignant neoplasm of bone: Secondary | ICD-10-CM | POA: Diagnosis not present

## 2021-03-02 DIAGNOSIS — I700 Atherosclerosis of aorta: Secondary | ICD-10-CM | POA: Diagnosis not present

## 2021-03-02 DIAGNOSIS — J432 Centrilobular emphysema: Secondary | ICD-10-CM | POA: Diagnosis not present

## 2021-03-02 DIAGNOSIS — C50912 Malignant neoplasm of unspecified site of left female breast: Secondary | ICD-10-CM | POA: Diagnosis not present

## 2021-03-02 DIAGNOSIS — I2510 Atherosclerotic heart disease of native coronary artery without angina pectoris: Secondary | ICD-10-CM | POA: Diagnosis not present

## 2021-03-02 DIAGNOSIS — C50919 Malignant neoplasm of unspecified site of unspecified female breast: Secondary | ICD-10-CM | POA: Diagnosis not present

## 2021-03-02 DIAGNOSIS — J8410 Pulmonary fibrosis, unspecified: Secondary | ICD-10-CM | POA: Diagnosis not present

## 2021-03-02 LAB — GLUCOSE, CAPILLARY: Glucose-Capillary: 96 mg/dL (ref 70–99)

## 2021-03-02 MED ORDER — FLUDEOXYGLUCOSE F - 18 (FDG) INJECTION
10.4000 | Freq: Once | INTRAVENOUS | Status: AC | PRN
  Administered 2021-03-02: 10.92 via INTRAVENOUS

## 2021-03-03 ENCOUNTER — Other Ambulatory Visit (HOSPITAL_COMMUNITY): Payer: Self-pay

## 2021-03-06 ENCOUNTER — Other Ambulatory Visit (HOSPITAL_COMMUNITY): Payer: Self-pay

## 2021-03-06 NOTE — Progress Notes (Signed)
[place]  [phone] [phone]  ID: Alicia [name] OB: July 25, 1950  MR#: [id number]  XNA#:[id number]  Patient Care Team: [name], [name], Harrell Gave, [name] Rico Junker, RN as Registered Nurse Lloyd Huger, MD as Consulting Physician (Oncology) Bary Castilla Forest Gleason, MD as Consulting Physician (General Surgery) Bary Castilla Forest Gleason, MD as Consulting Physician (General Surgery) Noreene Filbert, MD as Referring Physician (Radiation Oncology) Jeral Fruit, RN as Registered Nurse   CHIEF COMPLAINT: Recurrent stage IV ER/PR positive, 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  INTERVAL HISTORY: Patient returns to clinic today for further evaluation, discussion of her PET scan results, and continuation of Ibrance and Faslodex.  She has increased weakness and fatigue today, but blames this on a UTI and is currently taking antibiotics.  She continues to have a right jaw pain and swelling.  She continues to have a mild peripheral neuropathy, but no other neurologic complaints.  She denies any chest pain, shortness of breath, cough, or hemoptysis.  She denies any nausea, vomiting, constipation, or diarrhea.  She has no urinary complaints.  Patient offers no further specific complaints today.  REVIEW OF SYSTEMS:   Review of Systems  Constitutional:  Positive for malaise/fatigue. Negative for fever and weight loss.  HENT:  Negative for congestion.   Respiratory: Negative.  Negative for cough and shortness of breath.   Cardiovascular: Negative.  Negative for chest pain and leg swelling.  Gastrointestinal: Negative.  Negative for abdominal pain, constipation, diarrhea and nausea.  Genitourinary:  Positive for dysuria. Negative for urgency.  Musculoskeletal: Negative.  Negative for back pain and joint pain.  Skin: Negative.  Negative for rash.  Neurological:   Positive for tingling, sensory change and weakness. Negative for dizziness, focal weakness and headaches.  Psychiatric/Behavioral:  The patient is not nervous/anxious.    As per HPI. Otherwise, a complete review of systems is negative.  PAST MEDICAL HISTORY: Past Medical History:  Diagnosis Date   Anxiety    Breast cancer, left ([place]) 07/2017   Depression    Hypertension    Hypothyroidism    Personal history of chemotherapy 2019   LEFT mastectomy-chemo before   Personal history of radiation therapy 03/2018   LEFT mastectomy   Rapid heart rate    Thyroid disease     PAST SURGICAL HISTORY: Past Surgical History:  Procedure Laterality Date   AXILLARY LYMPH NODE BIOPSY Left 07/16/2017   METASTATIC MAMMARY CARCINOMA   BREAST BIOPSY Left 07/16/2017   [place] bx of left breast mass and left breast LN.  INVASIVE MAMMARY CARCINOMA, NO SPECIAL TYPE.    BREAST EXCISIONAL BIOPSY Right 2001   benign   BREAST LUMPECTOMY WITH SENTINEL LYMPH NODE BIOPSY Left 12/06/2017   Procedure: BREAST LUMPECTOMY WITH SENTINEL LYMPH NODE BX;  Surgeon: Robert Bellow, MD;  Location: [facility] OR[facility];  Service: General;  Laterality: Left;   COLONOSCOPY     MASTECTOMY Left 12/23/2017   PORTACATH PLACEMENT Right 08/07/2017   Procedure: INSERTION PORT-A-CATH;  Surgeon: Robert Bellow, MD;  Location: [facility];  Service: General;  Laterality: Right;   SIMPLE MASTECTOMY WITH AXILLARY SENTINEL NODE BIOPSY Left 12/23/2017   T2,N2 with 6/7 nodes positive. Whole breast radiation.  Surgeon: Robert Bellow, MD;  Location: [facility] OR[facility];  Service: General;  Laterality: Left;   TONSILLECTOMY      FAMILY HISTORY: Family History  Problem Relation Age of Onset   Stroke Mother  Thyroid disease Mother    Renal Disease Mother    Stroke Father    Heart attack Father    Sudden death Father 31       suicide   Anuerysm Brother    Breast cancer Neg Hx     ADVANCED DIRECTIVES (Y/N):  N  HEALTH MAINTENANCE: Social History    Tobacco Use   Smoking status: Former    Packs/day: 1.00    Years: 30.00    Pack years: 30.00    Types: Cigarettes    Quit date: 2019    Years since quitting: 4.1   Smokeless tobacco: Never  Vaping Use   Vaping Use: Never used  Substance Use Topics   Alcohol use: Not Currently   Drug use: Never     Colonoscopy:  PAP:  Bone density:  Lipid panel:  Allergies  Allergen Reactions   Sulfa Antibiotics Diarrhea    Current Outpatient Medications  Medication Sig Dispense Refill   acetaminophen (TYLENOL) 500 MG tablet Take 1,000 mg by mouth every 6 (six) hours as needed for moderate pain or headache.      ALPRAZolam (XANAX) 0.5 MG tablet Take 1 tablet (0.5 mg total) by mouth 2 (two) times daily. 60 tablet 0   amLODipine (NORVASC) 5 MG tablet Take 1 tablet (5 mg total) by mouth daily. 90 tablet 3   aspirin EC 81 MG tablet Take 81 mg by mouth at bedtime.      calcium carbonate (OS-CAL - DOSED IN MG OF ELEMENTAL CALCIUM) 1250 (500 Ca) MG tablet Take 1 tablet by mouth daily with breakfast.     Cholecalciferol (VITAMIN D3) 125 MCG (5000 UT) CAPS Take 1 capsule by mouth 2 (two) times a day.     ciprofloxacin (CIPRO) 500 MG tablet Take 1 tablet (500 mg total) by mouth 2 (two) times daily for 10 days. 20 tablet 0   docusate sodium (COLACE) 100 MG capsule Take 100 mg by mouth 3 (three) times daily as needed for mild constipation.     hydrochlorothiazide (HYDRODIURIL) 12.5 MG tablet Take 1 tablet (12.5 mg total) by mouth daily. 90 tablet 3   levothyroxine (SYNTHROID) 100 MCG tablet TAKE 1 TABLET BY MOUTH DAILY 90 tablet 3   nystatin-lidocaine-prednisoLONE-diphenhydrAMINE-distilled water-alum & mag hydroxide-simeth Swish and spit 5-50m four times a day as needed 480 mL 3   palbociclib (IBRANCE) 125 MG tablet Take 1 tablet (125 mg total) by mouth daily. Take for 21 days, then hold for 7 days. Repeat every 28 days. 21 tablet 3   potassium chloride SA (KLOR-CON M) 20 MEQ tablet Take 1 tablet (20  mEq total) by mouth 2 (two) times daily. 60 tablet 3   rosuvastatin (CRESTOR) 5 MG tablet TAKE 1 TABLET BY MOUTH DAILY 30 tablet 6   traZODone (DESYREL) 50 MG tablet TAKE 1 TABLET BY MOUTH DAILY 30 tablet 6   ondansetron (ZOFRAN) 8 MG tablet Take 1 tablet (8 mg total) by mouth every 8 (eight) hours as needed for nausea or vomiting. (Patient not taking: Reported on 03/07/2021) 20 tablet 0   No current facility-administered medications for this visit.    OBJECTIVE: Vitals:   03/07/21 1314  BP: 116/67  Pulse: 85  Resp: 16  Temp: 98 F (36.7 C)     Body mass index is 32.81 kg/m.    ECOG FS:0 - Asymptomatic  General: Well-developed, well-nourished, no acute distress. Eyes: Pink conjunctiva, anicteric sclera. HEENT: Normocephalic, moist mucous membranes.  Right jaw with swelling and  tenderness, no erythema. Lungs: No audible wheezing or coughing. Heart: Regular rate and rhythm. Abdomen: Soft, nontender, no obvious distention. Musculoskeletal: No edema, cyanosis, or clubbing. Neuro: Alert, answering all questions appropriately. Cranial nerves grossly intact. Skin: No rashes or petechiae noted. Psych: Normal affect.  LAB RESULTS:  Lab Results  Component Value Date   NA 133 (L) 03/07/2021   K 2.8 (L) 03/07/2021   CL 94 (L) 03/07/2021   CO2 24 03/07/2021   GLUCOSE 106 (H) 03/07/2021   BUN 15 03/07/2021   CREATININE 1.72 (H) 03/07/2021   CALCIUM 9.8 03/07/2021   PROT 7.8 03/07/2021   ALBUMIN 3.8 03/07/2021   AST 28 03/07/2021   ALT 13 03/07/2021   ALKPHOS 62 03/07/2021   BILITOT 0.7 03/07/2021   GFRNONAA 31 (L) 03/07/2021   GFRAA 57 (L) 10/01/2019    Lab Results  Component Value Date   WBC 2.1 (L) 03/07/2021   NEUTROABS 1.7 03/07/2021   HGB 9.6 (L) 03/07/2021   HCT 28.4 (L) 03/07/2021   MCV 104.0 (H) 03/07/2021   PLT 176 03/07/2021     STUDIES: NM PET Image Restage (PS) Skull Base to Thigh (F-18 FDG)  Result Date: 03/03/2021 CLINICAL DATA:  Subsequent treatment  strategy for breast cancer. EXAM: NUCLEAR MEDICINE PET SKULL BASE TO THIGH TECHNIQUE: 10.9 mCi F-18 FDG was injected intravenously. Full-ring PET imaging was performed from the skull base to thigh after the radiotracer. CT data was obtained and used for attenuation correction and anatomic localization. Fasting blood glucose: 96 mg/dl COMPARISON:  11/10/2020. FINDINGS: Mediastinal blood pool activity: SUV max 2.9 Liver activity: SUV max NA NECK: Hypermetabolic soft tissue mass surrounds the right mandibular ramus, roughly measuring 3.7 x 3.9 cm (3/18), SUV max 11.3, with associated involvement of the bone. Findings are progressive from 11/10/2020. No hypermetabolic lymph nodes. Incidental CT findings: None. CHEST: Muscular uptake in the upper chest is likely physiologic. No hypermetabolic mediastinal, hilar, internal mammary or axillary lymph nodes. No hypermetabolic lymph nodes. Minimal uptake in the distal esophagus, new, without a CT correlate. Incidental CT findings: Atherosclerotic calcification of the aorta, aortic valve and coronary arteries. Heart is at the upper limits of normal in size. No pericardial effusion. Centrilobular emphysema. Calcified granuloma in the right lower lobe. Subpleural radiation scarring in the anterior left upper lobe. ABDOMEN/PELVIS: No abnormal hypermetabolism in the liver, adrenal glands, spleen or pancreas. No hypermetabolic lymph nodes. Difficult to exclude mild uptake in a somewhat prominent cervix (3/227), SUV max 5.8, not seen previously. Incidental CT findings: Liver, gallbladder and adrenal glands are unremarkable. Probable renal sinus cysts on the right. 1.4 cm lesion in the upper pole left kidney is too small to characterize. 5.1 cm fluid density lesion in the interpolar left kidney is likely a cyst. Spleen, pancreas, stomach and bowel are otherwise unremarkable. SKELETON: Persistently hypermetabolic metastatic lesion in the lateral left iliac wing, SUV max 11.9, increased  from 8.1. Right mandibular uptake and surrounding mass, described above. Very mild uptake along the anterior left chest wall, status post mastectomy, unchanged. Incidental CT findings: Degenerative changes in the spine. IMPRESSION: 1. Enlarging and increasingly hypermetabolic metastatic lesion centered in the right mandibular ramus. Additional osseous metastatic disease in the left iliac wing, minimally increased in FDG avidity from 11/10/2020. 2. Cervix is mildly prominent with possible mild hypermetabolism. Please correlate clinically. 3. Aortic atherosclerosis (ICD10-I70.0). Coronary artery calcification. Electronically Signed   By: Lorin Picket M.D.   On: 03/03/2021 14:29     ONCOLOGY HISTORY: Patient initially  received neoadjuvant chemotherapy with Adriamycin and Cytoxan followed by weekly Taxol. She only received 4 cycles of weekly Taxol prior to discontinuation of treatment on October 31, 2017 secondary to persistent peripheral neuropathy.  She ultimately required mastectomy and final pathology noted 5 of 6 lymph nodes positive for disease.  Patient completed adjuvant XRT in mid April 2020.  Pain initiated letrozole in May 2020, this was discontinued secondary to side effects and patient was started on anastrozole.  Nuclear medicine bone scan on June 19, 2019 revealed metastatic disease and anastrozole was subsequently discontinued.  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  PLAN:    1. 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  MRI of the brain on August 24, 2019 reviewed independently with no obvious evidence of metastatic disease.  Repeat PET scan on March 02, 2021 did not reveal any evidence of metastatic disease, but showed worsening lesion in her  right jaw.  Patient's CA 27-29 initially increased to 141.9, but appears to have plateaued since November 2021 between 46.9 and 72.7.  She has since trended up and her most recent value is 87.6.  Today's result is pending.   Despite persistent, mild neutropenia, will continue 125 mg Ibrance for 21 days with 7 days off.  Proceed with Faslodex today.  We will hold Xgeva at this time given her issues with her mandible.  Return to clinic in 4 weeks for repeat laboratory, further evaluation, and continuation of Faslodex and Ibrance.  Appreciate clinical pharmacy input. 2.  Peripheral neuropathy: Chronic and unchanged. 3.  History of pulmonary embolus: Patient was diagnosed with a small pulmonary embolus on January 10, 2018.  She is no longer on anticoagulation.  There was no evidence of recurrent PE on CT scan from October 27, 2019. 4.  Osteopenia: Patient's most recent bone mineral density on March 03, 2019 reported a T score of -1.8 which is only mildly decreased from 1 year prior when the reported T score was -1.6.  Continue calcium and vitamin D supplementation.  Xgeva as above. 5.  Left hip pain: Patient does not complain of this today.  Continue symptomatic treatment.  Patient has had XRT to this area. 6.  Leukopenia: Chronic and unchanged.  Patient's total white blood cell count is 2.1 today. This is ranged between 1.6 and 3.2 since July 2021.   7.  Anemia: Chronic and unchanged.  Patient's hemoglobin is 9.6. 8.  Renal insufficiency: Creatinine has trended up slightly to 1.72.  Possibly related to underlying UTI. 9.  Hypokalemia: Potassium worse today.  Continue oral potassium supplementation. 10.  UTI: Continue oral antibiotics as prescribed. 11.  Jaw lesion: Patient was instructed that it is imperative she see a dentist for further evaluation.  Patient expressed understanding and was in agreement with this plan. She also understands that She can call clinic at any time with any questions,  concerns, or complaints.    Cancer Staging  Breast cancer, stage 2, left ([place]) Staging form: Breast, AJCC 8th Edition - Clinical stage from 07/30/2017: Stage IIA (cT2, cN1, cM0, G2, ER+, PR+, HER2-) - Signed by Lloyd Huger, MD on 07/30/2017 Histologic grading system: 3 grade system Laterality: Left   Lloyd Huger, MD   03/07/2021 4:22 PM

## 2021-03-07 ENCOUNTER — Encounter: Payer: Self-pay | Admitting: Oncology

## 2021-03-07 ENCOUNTER — Other Ambulatory Visit: Payer: Self-pay

## 2021-03-07 ENCOUNTER — Inpatient Hospital Stay: Payer: Medicare Other

## 2021-03-07 ENCOUNTER — Inpatient Hospital Stay: Payer: Medicare Other | Attending: Oncology

## 2021-03-07 ENCOUNTER — Inpatient Hospital Stay (HOSPITAL_BASED_OUTPATIENT_CLINIC_OR_DEPARTMENT_OTHER): Payer: Medicare Other | Admitting: Oncology

## 2021-03-07 VITALS — BP 116/67 | HR 85 | Temp 98.0°F | Resp 16 | Wt 203.3 lb

## 2021-03-07 DIAGNOSIS — C50912 Malignant neoplasm of unspecified site of left female breast: Secondary | ICD-10-CM | POA: Insufficient documentation

## 2021-03-07 DIAGNOSIS — C50919 Malignant neoplasm of unspecified site of unspecified female breast: Secondary | ICD-10-CM

## 2021-03-07 DIAGNOSIS — C7951 Secondary malignant neoplasm of bone: Secondary | ICD-10-CM | POA: Diagnosis not present

## 2021-03-07 DIAGNOSIS — Z5111 Encounter for antineoplastic chemotherapy: Secondary | ICD-10-CM | POA: Insufficient documentation

## 2021-03-07 DIAGNOSIS — Z79899 Other long term (current) drug therapy: Secondary | ICD-10-CM | POA: Diagnosis not present

## 2021-03-07 LAB — CBC WITH DIFFERENTIAL/PLATELET
Abs Immature Granulocytes: 0.02 10*3/uL (ref 0.00–0.07)
Basophils Absolute: 0 10*3/uL (ref 0.0–0.1)
Basophils Relative: 1 %
Eosinophils Absolute: 0 10*3/uL (ref 0.0–0.5)
Eosinophils Relative: 1 %
HCT: 28.4 % — ABNORMAL LOW (ref 36.0–46.0)
Hemoglobin: 9.6 g/dL — ABNORMAL LOW (ref 12.0–15.0)
Immature Granulocytes: 1 %
Lymphocytes Relative: 12 %
Lymphs Abs: 0.3 10*3/uL — ABNORMAL LOW (ref 0.7–4.0)
MCH: 35.2 pg — ABNORMAL HIGH (ref 26.0–34.0)
MCHC: 33.8 g/dL (ref 30.0–36.0)
MCV: 104 fL — ABNORMAL HIGH (ref 80.0–100.0)
Monocytes Absolute: 0.2 10*3/uL (ref 0.1–1.0)
Monocytes Relative: 7 %
Neutro Abs: 1.7 10*3/uL (ref 1.7–7.7)
Neutrophils Relative %: 78 %
Platelets: 176 10*3/uL (ref 150–400)
RBC: 2.73 MIL/uL — ABNORMAL LOW (ref 3.87–5.11)
RDW: 15.1 % (ref 11.5–15.5)
Smear Review: NORMAL
WBC: 2.1 10*3/uL — ABNORMAL LOW (ref 4.0–10.5)
nRBC: 0 % (ref 0.0–0.2)

## 2021-03-07 LAB — COMPREHENSIVE METABOLIC PANEL
ALT: 13 U/L (ref 0–44)
AST: 28 U/L (ref 15–41)
Albumin: 3.8 g/dL (ref 3.5–5.0)
Alkaline Phosphatase: 62 U/L (ref 38–126)
Anion gap: 15 (ref 5–15)
BUN: 15 mg/dL (ref 8–23)
CO2: 24 mmol/L (ref 22–32)
Calcium: 9.8 mg/dL (ref 8.9–10.3)
Chloride: 94 mmol/L — ABNORMAL LOW (ref 98–111)
Creatinine, Ser: 1.72 mg/dL — ABNORMAL HIGH (ref 0.44–1.00)
GFR, Estimated: 31 mL/min — ABNORMAL LOW (ref >60)
Glucose, Bld: 106 mg/dL — ABNORMAL HIGH (ref 70–99)
Potassium: 2.8 mmol/L — ABNORMAL LOW (ref 3.5–5.1)
Sodium: 133 mmol/L — ABNORMAL LOW (ref 135–145)
Total Bilirubin: 0.7 mg/dL (ref 0.3–1.2)
Total Protein: 7.8 g/dL (ref 6.5–8.1)

## 2021-03-07 MED ORDER — FULVESTRANT 250 MG/5ML IM SOSY
500.0000 mg | PREFILLED_SYRINGE | Freq: Once | INTRAMUSCULAR | Status: AC
  Administered 2021-03-07: 500 mg via INTRAMUSCULAR
  Filled 2021-03-07: qty 10

## 2021-03-07 NOTE — Progress Notes (Signed)
Patient here for results she is currently being treated for a UTI.

## 2021-03-08 ENCOUNTER — Telehealth: Payer: Self-pay | Admitting: Emergency Medicine

## 2021-03-08 ENCOUNTER — Other Ambulatory Visit: Payer: Self-pay | Admitting: *Deleted

## 2021-03-08 LAB — CANCER ANTIGEN 27.29: CA 27.29: 79.8 U/mL — ABNORMAL HIGH (ref 0.0–38.6)

## 2021-03-08 MED ORDER — CIPROFLOXACIN HCL 500 MG PO TABS: 500.0000 mg | ORAL_TABLET | Freq: Two times a day (BID) | ORAL | 0 refills | Status: AC

## 2021-03-08 NOTE — Telephone Encounter (Signed)
Received call from pt requesting Pet scan results be sent to her dentist Dr. Johnnette Litter. Called dentist office to verify. Pet results and last office note faxed to office as requested.

## 2021-03-09 ENCOUNTER — Other Ambulatory Visit (HOSPITAL_COMMUNITY): Payer: Self-pay

## 2021-03-14 DIAGNOSIS — Z20822 Contact with and (suspected) exposure to covid-19: Secondary | ICD-10-CM | POA: Diagnosis not present

## 2021-03-21 ENCOUNTER — Ambulatory Visit (INDEPENDENT_AMBULATORY_CARE_PROVIDER_SITE_OTHER): Payer: Medicare Other | Admitting: Internal Medicine

## 2021-03-21 ENCOUNTER — Encounter: Payer: Self-pay | Admitting: Internal Medicine

## 2021-03-21 VITALS — BP 136/78 | HR 97 | Ht 66.0 in | Wt 204.0 lb

## 2021-03-21 DIAGNOSIS — C7951 Secondary malignant neoplasm of bone: Secondary | ICD-10-CM

## 2021-03-21 DIAGNOSIS — K219 Gastro-esophageal reflux disease without esophagitis: Secondary | ICD-10-CM

## 2021-03-21 DIAGNOSIS — E669 Obesity, unspecified: Secondary | ICD-10-CM | POA: Diagnosis not present

## 2021-03-21 DIAGNOSIS — F419 Anxiety disorder, unspecified: Secondary | ICD-10-CM | POA: Diagnosis not present

## 2021-03-21 DIAGNOSIS — E878 Other disorders of electrolyte and fluid balance, not elsewhere classified: Secondary | ICD-10-CM | POA: Diagnosis not present

## 2021-03-21 DIAGNOSIS — N390 Urinary tract infection, site not specified: Secondary | ICD-10-CM | POA: Diagnosis not present

## 2021-03-21 DIAGNOSIS — I10 Essential (primary) hypertension: Secondary | ICD-10-CM | POA: Diagnosis not present

## 2021-03-21 LAB — POCT URINALYSIS DIPSTICK
Bilirubin, UA: NEGATIVE
Glucose, UA: NEGATIVE
Ketones, UA: NEGATIVE
Leukocytes, UA: NEGATIVE
Nitrite, UA: NEGATIVE
Protein, UA: POSITIVE — AB
Spec Grav, UA: 1.025 (ref 1.010–1.025)
Urobilinogen, UA: 0.2 E.U./dL
pH, UA: 5.5 (ref 5.0–8.0)

## 2021-03-21 MED ORDER — ALPRAZOLAM 0.5 MG PO TABS: 0.5000 mg | ORAL_TABLET | Freq: Two times a day (BID) | ORAL | 1 refills | Status: DC

## 2021-03-21 NOTE — Progress Notes (Signed)
Established Patient Office Visit  Subjective:  Patient ID: Alicia [name], female    DOB: 10-22-1950  Age: 71 y.o. MRN: [id number]  CC:  Chief Complaint  Patient presents with   Medication Refill    Medication Refill Associated symptoms include fatigue. Pertinent negatives include no chest pain or coughing.   Airel Magadan [facility] presents for follow up uti  Past Medical History:  Diagnosis Date   Anxiety    Breast cancer, left ([place]) 07/2017   Depression    Hypertension    Hypothyroidism    Personal history of chemotherapy 2019   LEFT mastectomy-chemo before   Personal history of radiation therapy 03/2018   LEFT mastectomy   Rapid heart rate    Thyroid disease     Past Surgical History:  Procedure Laterality Date   AXILLARY LYMPH NODE BIOPSY Left 07/16/2017   METASTATIC MAMMARY CARCINOMA   BREAST BIOPSY Left 07/16/2017   [place] bx of left breast mass and left breast LN.  INVASIVE MAMMARY CARCINOMA, NO SPECIAL TYPE.    BREAST EXCISIONAL BIOPSY Right 2001   benign   BREAST LUMPECTOMY WITH SENTINEL LYMPH NODE BIOPSY Left 12/06/2017   Procedure: BREAST LUMPECTOMY WITH SENTINEL LYMPH NODE BX;  Surgeon: Robert Bellow, MD;  Location: [facility] ORS;  Service: General;  Laterality: Left;   COLONOSCOPY     MASTECTOMY Left 12/23/2017   PORTACATH PLACEMENT Right 08/07/2017   Procedure: INSERTION PORT-A-CATH;  Surgeon: Robert Bellow, MD;  Location: [facility] ORS;  Service: General;  Laterality: Right;   SIMPLE MASTECTOMY WITH AXILLARY SENTINEL NODE BIOPSY Left 12/23/2017   T2,N2 with 6/7 nodes positive. Whole breast radiation.  Surgeon: Robert Bellow, MD;  Location: [facility] ORS;  Service: General;  Laterality: Left;   TONSILLECTOMY      Family History  Problem Relation Age of Onset   Stroke Mother    Thyroid disease Mother    Renal Disease Mother    Stroke Father    Heart attack Father    Sudden death Father 70       suicide   Anuerysm Brother     Breast cancer Neg Hx     Social History   Socioeconomic History   Marital status: Widowed    Spouse name: Not on file   Number of children: Not on file   Years of education: Not on file   Highest education level: Not on file  Occupational History   Not on file  Tobacco Use   Smoking status: Former    Packs/day: 1.00    Years: 30.00    Pack years: 30.00    Types: Cigarettes    Quit date: 2019    Years since quitting: 4.1   Smokeless tobacco: Never  Vaping Use   Vaping Use: Never used  Substance and Sexual Activity   Alcohol use: Not Currently   Drug use: Never   Sexual activity: Not Currently  Other Topics Concern   Not on file  Social History Narrative   Not on file   Social Determinants of Health   Financial Resource Strain: Low Risk    Difficulty of Paying Living Expenses: Not very hard  Food Insecurity: Food Insecurity Present   Worried About Running Out of Food in the Last Year: Sometimes true   Ran Out of Food in the Last Year: Never true  Transportation Needs: No Transportation Needs   Lack of Transportation (Medical): No   Lack of Transportation (Non-Medical): No  Physical Activity: Insufficiently  Active   Days of Exercise per Week: 2 days   Minutes of Exercise per Session: 20 min  Stress: No Stress Concern Present   Feeling of Stress : Not at all  Social Connections: Socially Isolated   Frequency of Communication with Friends and Family: Twice a week   Frequency of Social Gatherings with Friends and Family: Twice a week   Attends Religious Services: Never   Marine scientist or Organizations: No   Attends Archivist Meetings: Never   Marital Status: Widowed  Human resources officer Violence: Not At Risk   Fear of Current or Ex-Partner: No   Emotionally Abused: No   Physically Abused: No   Sexually Abused: No     Current Outpatient Medications:    acetaminophen (TYLENOL) 500 MG tablet, Take 1,000 mg by  mouth every 6 (six) hours as needed for moderate pain or headache. , Disp: , Rfl:    ALPRAZolam (XANAX) 0.5 MG tablet, Take 1 tablet (0.5 mg total) by mouth 2 (two) times daily., Disp: 60 tablet, Rfl: 1   amLODipine (NORVASC) 5 MG tablet, Take 1 tablet (5 mg total) by mouth daily., Disp: 90 tablet, Rfl: 3   aspirin EC 81 MG tablet, Take 81 mg by mouth at bedtime. , Disp: , Rfl:    calcium carbonate (OS-CAL - DOSED IN MG OF ELEMENTAL CALCIUM) 1250 (500 Ca) MG tablet, Take 1 tablet by mouth daily with breakfast., Disp: , Rfl:    Cholecalciferol (VITAMIN D3) 125 MCG (5000 UT) CAPS, Take 1 capsule by mouth 2 (two) times a day., Disp: , Rfl:    docusate sodium (COLACE) 100 MG capsule, Take 100 mg by mouth 3 (three) times daily as needed for mild constipation., Disp: , Rfl:    hydrochlorothiazide (HYDRODIURIL) 12.5 MG tablet, Take 1 tablet (12.5 mg total) by mouth daily., Disp: 90 tablet, Rfl: 3   levothyroxine (SYNTHROID) 100 MCG tablet, TAKE 1 TABLET BY MOUTH DAILY, Disp: 90 tablet, Rfl: 3   nystatin-lidocaine-prednisoLONE-diphenhydrAMINE-distilled water-alum & mag hydroxide-simeth, Swish and spit 5-55m four times a day as needed, Disp: 480 mL, Rfl: 3   palbociclib (IBRANCE) 125 MG tablet, Take 1 tablet (125 mg total) by mouth daily. Take for 21 days, then hold for 7 days. Repeat every 28 days., Disp: 21 tablet, Rfl: 3   potassium chloride SA (KLOR-CON M) 20 MEQ tablet, Take 1 tablet (20 mEq total) by mouth 2 (two) times daily., Disp: 60 tablet, Rfl: 3   rosuvastatin (CRESTOR) 5 MG tablet, TAKE 1 TABLET BY MOUTH DAILY, Disp: 30 tablet, Rfl: 6   traZODone (DESYREL) 50 MG tablet, TAKE 1 TABLET BY MOUTH DAILY, Disp: 30 tablet, Rfl: 6   Allergies  Allergen Reactions   Sulfa Antibiotics Diarrhea    ROS Review of Systems  Constitutional:  Positive for fatigue.  HENT:  Positive for dental problem.   Respiratory:  Negative for cough.   Cardiovascular:  Negative for chest pain.   Gastrointestinal:  Negative for abdominal distention.  Genitourinary:  Positive for dysuria.     Objective:    Physical Exam Cardiovascular:     Rate and Rhythm: Normal rate and regular rhythm.  Pulmonary:     Effort: No respiratory distress.     Breath sounds: No wheezing.  Chest:     Chest wall: No tenderness.  Abdominal:     Palpations: Abdomen is soft.    BP 136/78    Pulse 97    Ht '5\' 6"'$  (1.676 m)  Wt 204 lb (92.5 kg)    BMI 32.93 kg/m  Wt Readings from Last 3 Encounters:  03/21/21 204 lb (92.5 kg)  03/07/21 203 lb 4.8 oz (92.2 kg)  03/01/21 201 lb 3.2 oz (91.3 kg)     Health Maintenance Due  Topic Date Due   Hepatitis C Screening  Never done   Zoster Vaccines- Shingrix (1 of 2) Never done   COLONOSCOPY (Pts 45-105yr Insurance coverage will need to be confirmed)  Never done   Pneumonia Vaccine 72 Years old (1 - PCV) Never done   COVID-19 Vaccine (3 - Pfizer risk series) 06/30/2019   TETANUS/TDAP  09/13/2020    There are no preventive care reminders to display for this patient.  Lab Results  Component Value Date   TSH 0.925 10/27/2019   Lab Results  Component Value Date   WBC 2.1 (L) 03/07/2021   HGB 9.6 (L) 03/07/2021   HCT 28.4 (L) 03/07/2021   MCV 104.0 (H) 03/07/2021   PLT 176 03/07/2021   Lab Results  Component Value Date   NA 133 (L) 03/07/2021   K 2.8 (L) 03/07/2021   CO2 24 03/07/2021   GLUCOSE 106 (H) 03/07/2021   BUN 15 03/07/2021   CREATININE 1.72 (H) 03/07/2021   BILITOT 0.7 03/07/2021   ALKPHOS 62 03/07/2021   AST 28 03/07/2021   ALT 13 03/07/2021   PROT 7.8 03/07/2021   ALBUMIN 3.8 03/07/2021   CALCIUM 9.8 03/07/2021   ANIONGAP 15 03/07/2021   Lab Results  Component Value Date   CHOL 202 (H) 12/15/2019   Lab Results  Component Value Date   HDL 104 12/15/2019   Lab Results  Component Value Date   LDLCALC 87 12/15/2019   Lab Results  Component Value Date   TRIG 60 12/15/2019   Lab Results  Component Value  Date   CHOLHDL 1.9 12/15/2019   Lab Results  Component Value Date   HGBA1C 5.9 (H) 10/04/2017      Assessment & Plan:   Problem List Items Addressed This Visit       Cardiovascular and Mediastinum   Primary hypertension     Patient denies any chest pain or shortness of breath there is no history of palpitation or paroxysmal nocturnal dyspnea   patient was advised to follow low-salt low-cholesterol diet    ideally I want to keep systolic blood pressure below 130 mmHg, patient was asked to check blood pressure one times a week and give me a report on that.  Patient will be follow-up in 3 months  or earlier as needed, patient will call me back for any change in the cardiovascular symptoms Patient was advised to buy a book from local bookstore concerning blood pressure and read several chapters  every day.  This will be supplemented by some of the material we will give him from the office.  Patient should also utilize other resources like YouTube and Internet to learn more about the blood pressure and the diet.        Digestive   Gastroesophageal reflux disease without esophagitis    - The patient's GERD is stable on medication.  - Instructed the patient to avoid eating spicy and acidic foods, as well as foods high in fat. - Instructed the patient to avoid eating large meals or meals 2-3 hours prior to sleeping.        Musculoskeletal and Integument   Bone metastases ([place] - Primary    Patient is under care of  oncologist      Relevant Medications   ALPRAZolam (XANAX) 0.5 MG tablet     Other   Anxiety     Keeping a stress/anxiety diary. This can help you learn what triggers your reaction and then learn ways to manage your response.  Thinking about how you react to certain situations. You may not be able to control everything, but you can control your response.  Making time for activities that help you relax and not feeling guilty about spending your time in this  way.  Visual imagery and yoga can help you stay calm and relax.      Relevant Medications   ALPRAZolam (XANAX) 0.5 MG tablet   Obesity (BMI 35.0-39.9 without comorbidity)    Patient was advised to lose weight       Meds ordered this encounter  Medications   ALPRAZolam (XANAX) 0.5 MG tablet    Sig: Take 1 tablet (0.5 mg total) by mouth 2 (two) times daily.    Dispense:  60 tablet    Refill:  1    Follow-up: No follow-ups on file.    [name], MD

## 2021-03-21 NOTE — Assessment & Plan Note (Signed)
Patient was advised to lose weight 

## 2021-03-21 NOTE — Assessment & Plan Note (Signed)

## 2021-03-21 NOTE — Assessment & Plan Note (Signed)
   Keeping a stress/anxiety diary. This can help you learn what triggers your reaction and then learn ways to manage your response.  Thinking about how you react to certain situations. You may not be able to control everything, but you can control your response.  Making time for activities that help you relax and not feeling guilty about spending your time in this way.  Visual imagery and yoga can help you stay calm and relax. 

## 2021-03-21 NOTE — Assessment & Plan Note (Signed)
-   The patient's GERD is stable on medication.  - Instructed the patient to avoid eating spicy and acidic foods, as well as foods high in fat. - Instructed the patient to avoid eating large meals or meals 2-3 hours prior to sleeping. 

## 2021-03-21 NOTE — Assessment & Plan Note (Signed)
Patient is under care of oncologist 

## 2021-03-22 LAB — ELECTROLYTE PANEL
CO2: 23 mmol/L (ref 20–32)
Chloride: 98 mmol/L (ref 98–110)
Potassium: 4.3 mmol/L (ref 3.5–5.3)
Sodium: 136 mmol/L (ref 135–146)

## 2021-03-25 DIAGNOSIS — Z20822 Contact with and (suspected) exposure to covid-19: Secondary | ICD-10-CM | POA: Diagnosis not present

## 2021-03-26 ENCOUNTER — Other Ambulatory Visit: Payer: Self-pay | Admitting: Internal Medicine

## 2021-03-31 ENCOUNTER — Ambulatory Visit (INDEPENDENT_AMBULATORY_CARE_PROVIDER_SITE_OTHER): Payer: Medicare Other | Admitting: Nurse Practitioner

## 2021-03-31 ENCOUNTER — Other Ambulatory Visit: Payer: Self-pay

## 2021-03-31 ENCOUNTER — Encounter: Payer: Self-pay | Admitting: Nurse Practitioner

## 2021-03-31 VITALS — BP 140/70 | HR 81 | Ht 66.0 in | Wt 204.1 lb

## 2021-03-31 DIAGNOSIS — R319 Hematuria, unspecified: Secondary | ICD-10-CM | POA: Diagnosis not present

## 2021-03-31 LAB — POCT URINALYSIS DIPSTICK
Bilirubin, UA: NEGATIVE
Blood, UA: POSITIVE
Glucose, UA: NEGATIVE
Ketones, UA: NEGATIVE
Leukocytes, UA: NEGATIVE
Nitrite, UA: NEGATIVE
Protein, UA: NEGATIVE
Spec Grav, UA: 1.02 (ref 1.010–1.025)
Urobilinogen, UA: 0.2 E.U./dL
pH, UA: 6 (ref 5.0–8.0)

## 2021-03-31 NOTE — Assessment & Plan Note (Addendum)
Microscopic hematuria. ?Sent urine for the culture. ?Sent order for  US abdomen ?

## 2021-03-31 NOTE — Progress Notes (Signed)
? ?Established Patient Office Visit ? ?Subjective:  ?Patient ID: Alicia [name], female    DOB: 01/08/1951  Age: 71 y.o. MRN: [id number] ? ?CC:  ?Chief Complaint  ?Patient presents with  ? Hematuria  ?  Patient is for  a follow up for hematuria (to recheck urine)  ? ? ? ?HPI ? ?Alicia [name] [facility] presents for follow up on hematuria. ? ?HPI  ? ?Past Medical History:  ?Diagnosis Date  ? Anxiety   ? Breast cancer, left ([place]) 07/2017  ? Depression   ? Hypertension   ? Hypothyroidism   ? Personal history of chemotherapy 2019  ? LEFT mastectomy-chemo before  ? Personal history of radiation therapy 03/2018  ? LEFT mastectomy  ? Rapid heart rate   ? Thyroid disease   ? ? ?Past Surgical History:  ?Procedure Laterality Date  ? AXILLARY LYMPH NODE BIOPSY Left 07/16/2017  ? METASTATIC MAMMARY CARCINOMA  ? BREAST BIOPSY Left 07/16/2017  ? [place] bx of left breast mass and left breast LN.  INVASIVE MAMMARY CARCINOMA, NO SPECIAL TYPE.   ? BREAST EXCISIONAL BIOPSY Right 2001  ? benign  ? BREAST LUMPECTOMY WITH SENTINEL LYMPH NODE BIOPSY Left 12/06/2017  ? Procedure: BREAST LUMPECTOMY WITH SENTINEL LYMPH NODE BX;  Surgeon: Robert Bellow, MD;  Location: [facility] ORS;  Service: General;  Laterality: Left;  ? COLONOSCOPY    ? MASTECTOMY Left 12/23/2017  ? PORTACATH PLACEMENT Right 08/07/2017  ? Procedure: INSERTION PORT-A-CATH;  Surgeon: Robert Bellow, MD;  Location: [facility] ORS;  Service: General;  Laterality: Right;  ? SIMPLE MASTECTOMY WITH AXILLARY SENTINEL NODE BIOPSY Left 12/23/2017  ? T2,N2 with 6/7 nodes positive. Whole breast radiation.  Surgeon: Robert Bellow, MD;  Location: [facility] ORS;  Service: General;  Laterality: Left;  ? TONSILLECTOMY    ? ? ?Family History  ?Problem Relation Age of Onset  ? Stroke Mother   ? Thyroid disease Mother   ? Renal Disease Mother   ? Stroke Father   ? Heart attack Father   ? Sudden death Father 8  ?     suicide  ? Anuerysm Brother   ? Breast cancer Neg Hx   ? ? ?Social History   ? ?Socioeconomic History  ? Marital status: Widowed  ?  Spouse name: Not on file  ? Number of children: Not on file  ? Years of education: Not on file  ? Highest education level: Not on file  ?Occupational History  ? Not on file  ?Tobacco Use  ? Smoking status: Former  ?  Packs/day: 1.00  ?  Years: 30.00  ?  Pack years: 30.00  ?  Types: Cigarettes  ?  Quit date: 2019  ?  Years since quitting: 4.2  ? Smokeless tobacco: Never  ?Vaping Use  ? Vaping Use: Never used  ?Substance and Sexual Activity  ? Alcohol use: Not Currently  ? Drug use: Never  ? Sexual activity: Not Currently  ?Other Topics Concern  ? Not on file  ?Social History Narrative  ? Not on file  ? ?Social Determinants of Health  ? ?Financial Resource Strain: Low Risk   ? Difficulty of Paying Living Expenses: Not very hard  ?Food Insecurity: Food Insecurity Present  ? Worried About Charity fundraiser in the Last Year: Sometimes true  ? Ran Out of Food in the Last Year: Never true  ?Transportation Needs: No Transportation Needs  ? Lack of Transportation (Medical): No  ? Lack of Transportation (Non-Medical):  No  ?Physical Activity: Insufficiently Active  ? Days of Exercise per Week: 2 days  ? Minutes of Exercise per Session: 20 min  ?Stress: No Stress Concern Present  ? Feeling of Stress : Not at all  ?Social Connections: Socially Isolated  ? Frequency of Communication with Friends and Family: Twice a week  ? Frequency of Social Gatherings with Friends and Family: Twice a week  ? Attends Religious Services: Never  ? Active Member of Clubs or Organizations: No  ? Attends Archivist Meetings: Never  ? Marital Status: Widowed  ?Intimate Partner Violence: Not At Risk  ? Fear of Current or Ex-Partner: No  ? Emotionally Abused: No  ? Physically Abused: No  ? Sexually Abused: No  ? ? ? ?Outpatient Medications Prior to Visit  ?Medication Sig Dispense Refill  ? acetaminophen (TYLENOL) 500 MG tablet Take 1,000 mg by mouth every 6 (six) hours as needed for  moderate pain or headache.     ? ALPRAZolam (XANAX) 0.5 MG tablet Take 1 tablet (0.5 mg total) by mouth 2 (two) times daily. 60 tablet 1  ? amLODipine (NORVASC) 5 MG tablet Take 1 tablet (5 mg total) by mouth daily. 90 tablet 3  ? aspirin EC 81 MG tablet Take 81 mg by mouth at bedtime.     ? calcium carbonate (OS-CAL - DOSED IN MG OF ELEMENTAL CALCIUM) 1250 (500 Ca) MG tablet Take 1 tablet by mouth daily with breakfast.    ? Cholecalciferol (VITAMIN D3) 125 MCG (5000 UT) CAPS Take 1 capsule by mouth 2 (two) times a day.    ? docusate sodium (COLACE) 100 MG capsule Take 100 mg by mouth 3 (three) times daily as needed for mild constipation.    ? hydrochlorothiazide (HYDRODIURIL) 12.5 MG tablet Take 1 tablet (12.5 mg total) by mouth daily. 90 tablet 3  ? levothyroxine (SYNTHROID) 100 MCG tablet TAKE 1 TABLET BY MOUTH DAILY 90 tablet 3  ? nystatin-lidocaine-prednisoLONE-diphenhydrAMINE-distilled water-alum & mag hydroxide-simeth Swish and spit 5-54m four times a day as needed 480 mL 3  ? palbociclib (IBRANCE) 125 MG tablet Take 1 tablet (125 mg total) by mouth daily. Take for 21 days, then hold for 7 days. Repeat every 28 days. 21 tablet 3  ? potassium chloride SA (KLOR-CON M) 20 MEQ tablet Take 1 tablet (20 mEq total) by mouth 2 (two) times daily. 60 tablet 3  ? rosuvastatin (CRESTOR) 5 MG tablet TAKE 1 TABLET BY MOUTH DAILY 30 tablet 6  ? traZODone (DESYREL) 50 MG tablet TAKE 1 TABLET BY MOUTH DAILY 30 tablet 6  ? ?No facility-administered medications prior to visit.  ? ? ?Allergies  ?Allergen Reactions  ? Sulfa Antibiotics Diarrhea  ? ? ?ROS ?Review of Systems  ?Constitutional:  Negative for activity change and appetite change.  ?HENT:  Negative for congestion, sinus pressure and sinus pain.   ?Eyes: Negative.   ?Respiratory:  Negative for shortness of breath and wheezing.   ?Cardiovascular:  Negative for chest pain and palpitations.  ?Gastrointestinal: Negative.   ?Genitourinary:  Positive for flank pain. Negative  for difficulty urinating, dysuria and hematuria.  ?Musculoskeletal:  Negative for arthralgias and back pain.  ?Skin: Negative.   ?Neurological:  Negative for light-headedness, numbness and headaches.  ?Psychiatric/Behavioral:  Negative for agitation, behavioral problems and confusion.   ? ?  ?Objective:  ?  ?Physical Exam ?Constitutional:   ?   Appearance: Normal appearance. She is obese.  ?HENT:  ?   Head: Normocephalic.  ?  Right Ear: Tympanic membrane normal.  ?   Left Ear: Tympanic membrane normal.  ?   Nose: Nose normal.  ?   Mouth/Throat:  ?   Mouth: Mucous membranes are moist.  ?Eyes:  ?   Pupils: Pupils are equal, round, and reactive to light.  ?Cardiovascular:  ?   Rate and Rhythm: Normal rate and regular rhythm.  ?   Pulses: Normal pulses.  ?   Heart sounds: Normal heart sounds.  ?Pulmonary:  ?   Effort: Pulmonary effort is normal.  ?   Breath sounds: Normal breath sounds.  ?Abdominal:  ?   General: Bowel sounds are normal.  ?   Palpations: Abdomen is soft.  ?Musculoskeletal:     ?   General: Normal range of motion.  ?   Cervical back: Normal range of motion.  ?Skin: ?   General: Skin is warm and dry.  ?   Capillary Refill: Capillary refill takes less than 2 seconds.  ?Neurological:  ?   General: No focal deficit present.  ?   Mental Status: She is alert and oriented to person, place, and time. Mental status is at baseline.  ?Psychiatric:     ?   Mood and Affect: Mood normal.     ?   Behavior: Behavior normal.     ?   Thought Content: Thought content normal.     ?   Judgment: Judgment normal.  ? ? ?BP 140/70   Pulse 81   Ht '5\' 6"'$  (1.676 m)   Wt 204 lb 1.6 oz (92.6 kg)   BMI 32.94 kg/m?  ?Wt Readings from Last 3 Encounters:  ?03/31/21 204 lb 1.6 oz (92.6 kg)  ?03/21/21 204 lb (92.5 kg)  ?03/07/21 203 lb 4.8 oz (92.2 kg)  ? ? ? ?Health Maintenance Due  ?Topic Date Due  ? Hepatitis C Screening  Never done  ? Zoster Vaccines- Shingrix (1 of 2) Never done  ? COLONOSCOPY (Pts 45-79yr Insurance coverage  will need to be confirmed)  Never done  ? Pneumonia Vaccine 72 Years old (1 - PCV) Never done  ? COVID-19 Vaccine (3 - Pfizer risk series) 06/30/2019  ? TETANUS/TDAP  09/13/2020  ? ? ?There are no preventive care reminders

## 2021-04-02 NOTE — Progress Notes (Signed)
?[place]  ?[phone] [phone] ? ?[name] November 04, 1950  MR#: [id number]  OIB#:[id number] ? ?Patient Care Team: ?[name], MD as PCP - General (Internal Medicine) ?Nelva Bush, [name] ?Rico Junker, RN as Registered Nurse ?Lloyd Huger, MD as Consulting Physician (Oncology) ?Robert Bellow, MD as Consulting Physician (General Surgery) ?Robert Bellow, MD as Consulting Physician (General Surgery) ?Noreene Filbert, MD as Referring Physician (Radiation Oncology) ?Jeral Fruit, RN as Registered Nurse ? ? 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 ? ?INTERVAL HISTORY: Patient returns to clinic today for further evaluation and continuation of Ibrance and Faslodex.  She finally was able to see a dentist about her tooth abscess and this has essentially resolved.  She currently feels well.  She does not complain of weakness or fatigue today.  She continues to have a mild peripheral neuropathy, but no other neurologic complaints.  She denies any chest pain, shortness of breath, cough, or hemoptysis.  She denies any nausea, vomiting, constipation, or diarrhea.  She has no urinary complaints.  Patient offers no further specific complaints today. ? ?REVIEW OF SYSTEMS:   ?Review of Systems  ?Constitutional: Negative.  Negative for fever, malaise/fatigue and weight loss.  ?HENT:  Negative for congestion.   ?Respiratory: Negative.  Negative for cough and shortness of breath.   ?Cardiovascular: Negative.  Negative for chest pain and leg swelling.  ?Gastrointestinal: Negative.  Negative for abdominal pain, constipation, diarrhea and nausea.  ?Genitourinary: Negative.  Negative for dysuria and urgency.  ?Musculoskeletal: Negative.  Negative for back pain and joint pain.  ?Skin: Negative.  Negative for rash.  ?Neurological:  Positive for tingling  and sensory change. Negative for dizziness, focal weakness, weakness and headaches.  ?Psychiatric/Behavioral:  The patient is not nervous/anxious.   ? ?As per HPI. Otherwise, a complete review of systems is negative. ? ?PAST MEDICAL HISTORY: ?Past Medical History:  ?Diagnosis Date  ? Anxiety   ? Breast cancer, left ([facility]) 07/2017  ? Depression   ? Hypertension   ? Hypothyroidism   ? Personal history of chemotherapy 2019  ? LEFT mastectomy-chemo before  ? Personal history of radiation therapy 03/2018  ? LEFT mastectomy  ? Rapid heart rate   ? Thyroid disease   ? ? ?PAST SURGICAL HISTORY: ?Past Surgical History:  ?Procedure Laterality Date  ? AXILLARY LYMPH NODE BIOPSY Left 07/16/2017  ? METASTATIC MAMMARY CARCINOMA  ? BREAST BIOPSY Left 07/16/2017  ? [place] bx of left breast mass and left breast LN.  INVASIVE MAMMARY CARCINOMA, NO SPECIAL TYPE.   ? BREAST EXCISIONAL BIOPSY Right 2001  ? benign  ? BREAST LUMPECTOMY WITH SENTINEL LYMPH NODE BIOPSY Left 12/06/2017  ? Procedure: BREAST LUMPECTOMY WITH SENTINEL LYMPH NODE BX;  Surgeon: Robert Bellow, MD;  Location: [facility] ORS;  Service: General;  Laterality: Left;  ? COLONOSCOPY    ? MASTECTOMY Left 12/23/2017  ? PORTACATH PLACEMENT Right 08/07/2017  ? Procedure: INSERTION PORT-A-CATH;  Surgeon: Robert Bellow, MD;  Location: [facility] ORS;  Service: General;  Laterality: Right;  ? SIMPLE MASTECTOMY WITH AXILLARY SENTINEL NODE BIOPSY Left 12/23/2017  ? T2,N2 with 6/7 nodes positive. Whole breast radiation.  Surgeon: Robert Bellow, MD;  Location: [facility] ORS;  Service: General;  Laterality: Left;  ? TONSILLECTOMY    ? ? ?FAMILY HISTORY: ?Family History  ?Problem Relation Age of Onset  ? Stroke Mother   ? Thyroid  disease Mother   ? Renal Disease Mother   ? Stroke Father   ? Heart attack Father   ? Sudden death Father 12  ?     suicide  ? Anuerysm Brother   ? Breast cancer Neg Hx   ? ? ?ADVANCED DIRECTIVES (Y/N):  N ? ?HEALTH MAINTENANCE: ?Social History  ? ?Tobacco Use  ?  Smoking status: Former  ?  Packs/day: 1.00  ?  Years: 30.00  ?  Pack years: 30.00  ?  Types: Cigarettes  ?  Quit date: 2019  ?  Years since quitting: 4.2  ? Smokeless tobacco: Never  ?Vaping Use  ? Vaping Use: Never used  ?Substance Use Topics  ? Alcohol use: Not Currently  ? Drug use: Never  ? ? ? Colonoscopy: ? PAP: ? Bone density: ? Lipid panel: ? ?Allergies  ?Allergen Reactions  ? Sulfa Antibiotics Diarrhea  ? ? ?Current Outpatient Medications  ?Medication Sig Dispense Refill  ? acetaminophen (TYLENOL) 500 MG tablet Take 1,000 mg by mouth every 6 (six) hours as needed for moderate pain or headache.     ? ALPRAZolam (XANAX) 0.5 MG tablet Take 1 tablet (0.5 mg total) by mouth 2 (two) times daily. 60 tablet 1  ? amLODipine (NORVASC) 5 MG tablet Take 1 tablet (5 mg total) by mouth daily. 90 tablet 3  ? aspirin EC 81 MG tablet Take 81 mg by mouth at bedtime.     ? calcium carbonate (OS-CAL - DOSED IN MG OF ELEMENTAL CALCIUM) 1250 (500 Ca) MG tablet Take 1 tablet by mouth daily with breakfast.    ? Cholecalciferol (VITAMIN D3) 125 MCG (5000 UT) CAPS Take 1 capsule by mouth 2 (two) times a day.    ? hydrochlorothiazide (HYDRODIURIL) 12.5 MG tablet Take 1 tablet (12.5 mg total) by mouth daily. 90 tablet 3  ? levothyroxine (SYNTHROID) 100 MCG tablet TAKE 1 TABLET BY MOUTH DAILY 90 tablet 3  ? palbociclib (IBRANCE) 125 MG tablet Take 1 tablet (125 mg total) by mouth daily. Take for 21 days, then hold for 7 days. Repeat every 28 days. 21 tablet 3  ? potassium chloride SA (KLOR-CON M) 20 MEQ tablet Take 1 tablet (20 mEq total) by mouth 2 (two) times daily. 60 tablet 3  ? Probiotic Product (ALIGN PO) Take by mouth.    ? rosuvastatin (CRESTOR) 5 MG tablet TAKE 1 TABLET BY MOUTH DAILY 30 tablet 6  ? traZODone (DESYREL) 50 MG tablet TAKE 1 TABLET BY MOUTH DAILY 30 tablet 6  ? docusate sodium (COLACE) 100 MG capsule Take 100 mg by mouth 3 (three) times daily as needed for mild constipation. (Patient not taking: Reported on  04/05/2021)    ? nystatin-lidocaine-prednisoLONE-diphenhydrAMINE-distilled water-alum & mag hydroxide-simeth Swish and spit 5-19m four times a day as needed (Patient not taking: Reported on 04/05/2021) 480 mL 3  ? ?No current facility-administered medications for this visit.  ? ? ?OBJECTIVE: ?Vitals:  ? 04/05/21 1116  ?BP: (!) 105/47  ?Pulse: 65  ?Resp: 16  ?Temp: (!) 96.5 ?F (35.8 ?C)  ?SpO2: 100%  ?   Body mass index is 33.14 kg/m?.Marland Kitchen   ECOG FS:0 - Asymptomatic ? ?General: Well-developed, well-nourished, no acute distress. ?Eyes: Pink conjunctiva, anicteric sclera. ?HEENT: Normocephalic, moist mucous membranes. ?Lungs: No audible wheezing or coughing. ?Heart: Regular rate and rhythm. ?Abdomen: Soft, nontender, no obvious distention. ?Musculoskeletal: No edema, cyanosis, or clubbing. ?Neuro: Alert, answering all questions appropriately. Cranial nerves grossly intact. ?Skin: No rashes or petechiae  noted. ?Psych: Normal affect. ? ? ?LAB RESULTS: ? ?Lab Results  ?Component Value Date  ? NA 134 (L) 04/05/2021  ? K 3.3 (L) 04/05/2021  ? CL 98 04/05/2021  ? CO2 27 04/05/2021  ? GLUCOSE 100 (H) 04/05/2021  ? BUN 16 04/05/2021  ? CREATININE 1.23 (H) 04/05/2021  ? CALCIUM 9.0 04/05/2021  ? PROT 7.3 04/05/2021  ? ALBUMIN 4.0 04/05/2021  ? AST 28 04/05/2021  ? ALT 18 04/05/2021  ? ALKPHOS 56 04/05/2021  ? BILITOT 1.0 04/05/2021  ? GFRNONAA 47 (L) 04/05/2021  ? GFRAA 57 (L) 10/01/2019  ? ? ?Lab Results  ?Component Value Date  ? WBC 1.5 (L) 04/05/2021  ? NEUTROABS 1.1 (L) 04/05/2021  ? HGB 9.0 (L) 04/05/2021  ? HCT 26.7 (L) 04/05/2021  ? MCV 110.8 (H) 04/05/2021  ? PLT 131 (L) 04/05/2021  ? ? ? ?STUDIES: ?No results found. ? ? ?ONCOLOGY HISTORY: Patient initially received neoadjuvant chemotherapy with Adriamycin and Cytoxan followed by weekly Taxol. She only received 4 cycles of weekly Taxol prior to discontinuation of treatment on October 31, 2017 secondary to persistent peripheral neuropathy.  She ultimately required mastectomy  and final pathology noted 5 of 6 lymph nodes positive for disease.  Patient completed adjuvant XRT in mid April 2020.  Pain initiated letrozole in May 2020, this was discontinued secondary to side effects and

## 2021-04-03 ENCOUNTER — Other Ambulatory Visit: Payer: Self-pay | Admitting: Pharmacist

## 2021-04-03 ENCOUNTER — Other Ambulatory Visit (HOSPITAL_COMMUNITY): Payer: Self-pay

## 2021-04-03 DIAGNOSIS — C50912 Malignant neoplasm of unspecified site of left female breast: Secondary | ICD-10-CM

## 2021-04-03 MED ORDER — PALBOCICLIB 125 MG PO TABS
125.0000 mg | ORAL_TABLET | Freq: Every day | ORAL | 3 refills | Status: DC
  Filled 2021-04-03: qty 21, 28d supply, fill #0
  Filled 2021-05-02: qty 21, 28d supply, fill #1
  Filled 2021-05-31: qty 21, 28d supply, fill #2
  Filled 2021-07-06: qty 21, 28d supply, fill #3

## 2021-04-05 ENCOUNTER — Inpatient Hospital Stay: Payer: Medicare Other | Attending: Oncology

## 2021-04-05 ENCOUNTER — Ambulatory Visit
Admission: RE | Admit: 2021-04-05 | Discharge: 2021-04-05 | Disposition: A | Discharge status: Home / Self Care | Payer: Medicare Other | Source: Ambulatory Visit | Attending: Nurse Practitioner | Admitting: Nurse Practitioner

## 2021-04-05 ENCOUNTER — Other Ambulatory Visit: Payer: Self-pay

## 2021-04-05 ENCOUNTER — Inpatient Hospital Stay (HOSPITAL_BASED_OUTPATIENT_CLINIC_OR_DEPARTMENT_OTHER): Payer: Medicare Other | Admitting: Oncology

## 2021-04-05 ENCOUNTER — Encounter: Payer: Self-pay | Admitting: Oncology

## 2021-04-05 ENCOUNTER — Inpatient Hospital Stay: Payer: Medicare Other

## 2021-04-05 VITALS — BP 105/47 | HR 65 | Temp 96.5°F | Resp 16 | Ht 66.0 in | Wt 205.3 lb

## 2021-04-05 DIAGNOSIS — C50912 Malignant neoplasm of unspecified site of left female breast: Secondary | ICD-10-CM | POA: Diagnosis not present

## 2021-04-05 DIAGNOSIS — R319 Hematuria, unspecified: Secondary | ICD-10-CM

## 2021-04-05 DIAGNOSIS — Z79899 Other long term (current) drug therapy: Secondary | ICD-10-CM | POA: Diagnosis not present

## 2021-04-05 DIAGNOSIS — C7951 Secondary malignant neoplasm of bone: Secondary | ICD-10-CM

## 2021-04-05 DIAGNOSIS — Z5111 Encounter for antineoplastic chemotherapy: Secondary | ICD-10-CM | POA: Insufficient documentation

## 2021-04-05 DIAGNOSIS — C50919 Malignant neoplasm of unspecified site of unspecified female breast: Secondary | ICD-10-CM

## 2021-04-05 LAB — CBC WITH DIFFERENTIAL/PLATELET
Abs Immature Granulocytes: 0.01 10*3/uL (ref 0.00–0.07)
Basophils Absolute: 0 10*3/uL (ref 0.0–0.1)
Basophils Relative: 1 %
Eosinophils Absolute: 0 10*3/uL (ref 0.0–0.5)
Eosinophils Relative: 1 %
HCT: 26.7 % — ABNORMAL LOW (ref 36.0–46.0)
Hemoglobin: 9 g/dL — ABNORMAL LOW (ref 12.0–15.0)
Immature Granulocytes: 1 %
Lymphocytes Relative: 17 %
Lymphs Abs: 0.3 10*3/uL — ABNORMAL LOW (ref 0.7–4.0)
MCH: 37.3 pg — ABNORMAL HIGH (ref 26.0–34.0)
MCHC: 33.7 g/dL (ref 30.0–36.0)
MCV: 110.8 fL — ABNORMAL HIGH (ref 80.0–100.0)
Monocytes Absolute: 0.1 10*3/uL (ref 0.1–1.0)
Monocytes Relative: 7 %
Neutro Abs: 1.1 10*3/uL — ABNORMAL LOW (ref 1.7–7.7)
Neutrophils Relative %: 73 %
Platelets: 131 10*3/uL — ABNORMAL LOW (ref 150–400)
RBC: 2.41 MIL/uL — ABNORMAL LOW (ref 3.87–5.11)
RDW: 17.2 % — ABNORMAL HIGH (ref 11.5–15.5)
Smear Review: NORMAL
WBC: 1.5 10*3/uL — ABNORMAL LOW (ref 4.0–10.5)
nRBC: 0 % (ref 0.0–0.2)

## 2021-04-05 LAB — COMPREHENSIVE METABOLIC PANEL
ALT: 18 U/L (ref 0–44)
AST: 28 U/L (ref 15–41)
Albumin: 4 g/dL (ref 3.5–5.0)
Alkaline Phosphatase: 56 U/L (ref 38–126)
Anion gap: 9 (ref 5–15)
BUN: 16 mg/dL (ref 8–23)
CO2: 27 mmol/L (ref 22–32)
Calcium: 9 mg/dL (ref 8.9–10.3)
Chloride: 98 mmol/L (ref 98–111)
Creatinine, Ser: 1.23 mg/dL — ABNORMAL HIGH (ref 0.44–1.00)
GFR, Estimated: 47 mL/min — ABNORMAL LOW (ref >60)
Glucose, Bld: 100 mg/dL — ABNORMAL HIGH (ref 70–99)
Potassium: 3.3 mmol/L — ABNORMAL LOW (ref 3.5–5.1)
Sodium: 134 mmol/L — ABNORMAL LOW (ref 135–145)
Total Bilirubin: 1 mg/dL (ref 0.3–1.2)
Total Protein: 7.3 g/dL (ref 6.5–8.1)

## 2021-04-05 MED ORDER — FULVESTRANT 250 MG/5ML IM SOSY
500.0000 mg | PREFILLED_SYRINGE | Freq: Once | INTRAMUSCULAR | Status: AC
  Administered 2021-04-05: 500 mg via INTRAMUSCULAR

## 2021-04-06 ENCOUNTER — Other Ambulatory Visit (HOSPITAL_COMMUNITY): Payer: Self-pay

## 2021-04-06 LAB — CANCER ANTIGEN 27.29: CA 27.29: 82.3 U/mL — ABNORMAL HIGH (ref 0.0–38.6)

## 2021-04-07 ENCOUNTER — Encounter: Payer: Self-pay | Admitting: Oncology

## 2021-04-26 DIAGNOSIS — R059 Cough, unspecified: Secondary | ICD-10-CM | POA: Diagnosis not present

## 2021-04-26 DIAGNOSIS — Z20822 Contact with and (suspected) exposure to covid-19: Secondary | ICD-10-CM | POA: Diagnosis not present

## 2021-04-26 DIAGNOSIS — R051 Acute cough: Secondary | ICD-10-CM | POA: Diagnosis not present

## 2021-05-02 ENCOUNTER — Other Ambulatory Visit (HOSPITAL_COMMUNITY): Payer: Self-pay

## 2021-05-03 ENCOUNTER — Other Ambulatory Visit: Payer: Medicare Other

## 2021-05-03 ENCOUNTER — Ambulatory Visit: Payer: Medicare Other | Admitting: Oncology

## 2021-05-03 ENCOUNTER — Ambulatory Visit: Payer: Medicare Other

## 2021-05-04 ENCOUNTER — Ambulatory Visit: Payer: Medicare Other | Admitting: Oncology

## 2021-05-04 ENCOUNTER — Other Ambulatory Visit: Payer: Medicare Other

## 2021-05-04 ENCOUNTER — Encounter: Payer: Self-pay | Admitting: Nurse Practitioner

## 2021-05-04 ENCOUNTER — Ambulatory Visit (INDEPENDENT_AMBULATORY_CARE_PROVIDER_SITE_OTHER): Payer: Medicare Other | Admitting: Nurse Practitioner

## 2021-05-04 ENCOUNTER — Ambulatory Visit: Payer: Medicare Other

## 2021-05-04 VITALS — BP 135/65 | HR 84 | Ht 66.0 in | Wt 206.3 lb

## 2021-05-04 DIAGNOSIS — L237 Allergic contact dermatitis due to plants, except food: Secondary | ICD-10-CM | POA: Diagnosis not present

## 2021-05-04 DIAGNOSIS — N390 Urinary tract infection, site not specified: Secondary | ICD-10-CM | POA: Diagnosis not present

## 2021-05-04 LAB — POCT URINALYSIS DIPSTICK
Glucose, UA: NEGATIVE
Protein, UA: POSITIVE — AB
Spec Grav, UA: 1.025 (ref 1.010–1.025)
Urobilinogen, UA: NEGATIVE E.U./dL — AB
pH, UA: 5 (ref 5.0–8.0)

## 2021-05-04 MED ORDER — TRIAMCINOLONE ACETONIDE 0.1 % EX CREA: 1.0000 "application " | TOPICAL_CREAM | Freq: Two times a day (BID) | CUTANEOUS | 0 refills | Status: DC

## 2021-05-04 MED ORDER — OLOPATADINE HCL 0.1 % OP SOLN: 1.0000 [drp] | Freq: Two times a day (BID) | OPHTHALMIC | 12 refills | Status: DC

## 2021-05-04 MED ORDER — CIPROFLOXACIN HCL 500 MG PO TABS: 500.0000 mg | ORAL_TABLET | Freq: Two times a day (BID) | ORAL | 0 refills | Status: AC

## 2021-05-04 NOTE — Progress Notes (Signed)
? ?Established Patient Office Visit ? ?Subjective:  ?Patient ID: Alicia [name], female    DOB: [birth year]  Age: 70 y.o. MRN: [id number] ? ?CC:  ?Chief Complaint  ?Patient presents with  ? Urinary Tract Infection  ?  Patient has been having sxs for several weeks.   ? Poison Karlene Einstein  ? ? ? ?HPI ? ?Alicia [name] [facility] presents for evaluation of UTI. She states her urine was yellow and cloudy. She denies urgency, frequency and dysuria.  ?She came in contact with poison ivy while doing yard work two days ago.  ? ?HPI  ? ?Past Medical History:  ?Diagnosis Date  ? Anxiety   ? Breast cancer, left ([place]) 07/2017  ? Depression   ? Hypertension   ? Hypothyroidism   ? Personal history of chemotherapy 2019  ? LEFT mastectomy-chemo before  ? Personal history of radiation therapy 03/2018  ? LEFT mastectomy  ? Rapid heart rate   ? Thyroid disease   ? ? ?Past Surgical History:  ?Procedure Laterality Date  ? AXILLARY LYMPH NODE BIOPSY Left 07/16/2017  ? METASTATIC MAMMARY CARCINOMA  ? BREAST BIOPSY Left 07/16/2017  ? [place] bx of left breast mass and left breast LN.  INVASIVE MAMMARY CARCINOMA, NO SPECIAL TYPE.   ? BREAST EXCISIONAL BIOPSY Right 2001  ? benign  ? BREAST LUMPECTOMY WITH SENTINEL LYMPH NODE BIOPSY Left 12/06/2017  ? Procedure: BREAST LUMPECTOMY WITH SENTINEL LYMPH NODE BX;  Surgeon: Robert Bellow, MD;  Location: [facility] ORS;  Service: General;  Laterality: Left;  ? COLONOSCOPY    ? MASTECTOMY Left 12/23/2017  ? PORTACATH PLACEMENT Right 08/07/2017  ? Procedure: INSERTION PORT-A-CATH;  Surgeon: Robert Bellow, MD;  Location: [facility] ORS;  Service: General;  Laterality: Right;  ? SIMPLE MASTECTOMY WITH AXILLARY SENTINEL NODE BIOPSY Left 12/23/2017  ? T2,N2 with 6/7 nodes positive. Whole breast radiation.  Surgeon: Robert Bellow, MD;  Location: [facility] ORS;  Service: General;  Laterality: Left;  ? TONSILLECTOMY    ? ? ?Family History  ?Problem Relation Age of Onset  ? Stroke Mother   ? Thyroid disease Mother   ? Renal Disease  Mother   ? Stroke Father   ? Heart attack Father   ? Sudden death Father 50  ?     suicide  ? Anuerysm Brother   ? Breast cancer Neg Hx   ? ? ?Social History  ? ?Socioeconomic History  ? Marital status: Widowed  ?  Spouse name: Not on file  ? Number of children: Not on file  ? Years of education: Not on file  ? Highest education level: Not on file  ?Occupational History  ? Not on file  ?Tobacco Use  ? Smoking status: Former  ?  Packs/day: 1.00  ?  Years: 30.00  ?  Pack years: 30.00  ?  Types: Cigarettes  ?  Quit date: 2019  ?  Years since quitting: 4.3  ? Smokeless tobacco: Never  ?Vaping Use  ? Vaping Use: Never used  ?Substance and Sexual Activity  ? Alcohol use: Not Currently  ? Drug use: Never  ? Sexual activity: Not Currently  ?Other Topics Concern  ? Not on file  ?Social History Narrative  ? Not on file  ? ?Social Determinants of Health  ? ?Financial Resource Strain: Low Risk   ? Difficulty of Paying Living Expenses: Not very hard  ?Food Insecurity: Food Insecurity Present  ? Worried About Charity fundraiser in the Last Year: Sometimes  true  ? Ran Out of Food in the Last Year: Never true  ?Transportation Needs: No Transportation Needs  ? Lack of Transportation (Medical): No  ? Lack of Transportation (Non-Medical): No  ?Physical Activity: Insufficiently Active  ? Days of Exercise per Week: 2 days  ? Minutes of Exercise per Session: 20 min  ?Stress: No Stress Concern Present  ? Feeling of Stress : Not at all  ?Social Connections: Socially Isolated  ? Frequency of Communication with Friends and Family: Twice a week  ? Frequency of Social Gatherings with Friends and Family: Twice a week  ? Attends Religious Services: Never  ? Active Member of Clubs or Organizations: No  ? Attends Archivist Meetings: Never  ? Marital Status: Widowed  ?Intimate Partner Violence: Not At Risk  ? Fear of Current or Ex-Partner: No  ? Emotionally Abused: No  ? Physically Abused: No  ? Sexually Abused: No  ? ? ? ?Outpatient  Medications Prior to Visit  ?Medication Sig Dispense Refill  ? acetaminophen (TYLENOL) 500 MG tablet Take 1,000 mg by mouth every 6 (six) hours as needed for moderate pain or headache.     ? ALPRAZolam (XANAX) 0.5 MG tablet Take 1 tablet (0.5 mg total) by mouth 2 (two) times daily. 60 tablet 1  ? amLODipine (NORVASC) 5 MG tablet Take 1 tablet (5 mg total) by mouth daily. 90 tablet 3  ? aspirin EC 81 MG tablet Take 81 mg by mouth at bedtime.     ? calcium carbonate (OS-CAL - DOSED IN MG OF ELEMENTAL CALCIUM) 1250 (500 Ca) MG tablet Take 1 tablet by mouth daily with breakfast.    ? Cholecalciferol (VITAMIN D3) 125 MCG (5000 UT) CAPS Take 1 capsule by mouth 2 (two) times a day.    ? docusate sodium (COLACE) 100 MG capsule Take 100 mg by mouth 3 (three) times daily as needed for mild constipation.    ? hydrochlorothiazide (HYDRODIURIL) 12.5 MG tablet Take 1 tablet (12.5 mg total) by mouth daily. 90 tablet 3  ? levothyroxine (SYNTHROID) 100 MCG tablet TAKE 1 TABLET BY MOUTH DAILY 90 tablet 3  ? nystatin-lidocaine-prednisoLONE-diphenhydrAMINE-distilled water-alum & mag hydroxide-simeth Swish and spit 5-60m four times a day as needed 480 mL 3  ? palbociclib (IBRANCE) 125 MG tablet Take 1 tablet (125 mg total) by mouth daily. Take for 21 days, then hold for 7 days. Repeat every 28 days. 21 tablet 3  ? Probiotic Product (ALIGN PO) Take by mouth.    ? rosuvastatin (CRESTOR) 5 MG tablet TAKE 1 TABLET BY MOUTH DAILY 30 tablet 6  ? traZODone (DESYREL) 50 MG tablet TAKE 1 TABLET BY MOUTH DAILY 30 tablet 6  ? potassium chloride SA (KLOR-CON M) 20 MEQ tablet Take 1 tablet (20 mEq total) by mouth 2 (two) times daily. 60 tablet 3  ? ?No facility-administered medications prior to visit.  ? ? ?Allergies  ?Allergen Reactions  ? Sulfa Antibiotics Diarrhea  ? ? ?ROS ?Review of Systems  ?Constitutional:  Negative for activity change, appetite change and fever.  ?HENT:  Negative for congestion, facial swelling and sinus pressure.   ?Eyes:   Positive for redness.  ?Respiratory:  Negative for chest tightness and shortness of breath.   ?Cardiovascular:  Negative for chest pain and palpitations.  ?Gastrointestinal:  Negative for abdominal distention and anal bleeding.  ?Genitourinary:  Negative for dysuria and pelvic pain.  ?Musculoskeletal:  Negative for arthralgias and gait problem.  ?Skin:  Positive for rash.  ?  On the forehead  ?Neurological:  Negative for dizziness, facial asymmetry and headaches.  ?Psychiatric/Behavioral:  Negative for agitation, behavioral problems and confusion.   ? ?  ?Objective:  ?  ?Physical Exam ?Constitutional:   ?   Appearance: Normal appearance. She is obese.  ?HENT:  ?   Head: Normocephalic and atraumatic.  ?   Right Ear: Tympanic membrane normal.  ?   Left Ear: Tympanic membrane normal.  ?   Nose: Nose normal.  ?   Mouth/Throat:  ?   Mouth: Mucous membranes are moist.  ?   Pharynx: Oropharynx is clear.  ?Cardiovascular:  ?   Rate and Rhythm: Normal rate and regular rhythm.  ?   Pulses: Normal pulses.  ?   Heart sounds: Normal heart sounds.  ?Pulmonary:  ?   Effort: Pulmonary effort is normal.  ?   Breath sounds: Normal breath sounds.  ?Abdominal:  ?   General: Bowel sounds are normal.  ?   Palpations: Abdomen is soft.  ?Musculoskeletal:     ?   General: Normal range of motion.  ?   Cervical back: Normal range of motion and neck supple.  ?Skin: ?   General: Skin is warm.  ?   Findings: Rash present.  ?Neurological:  ?   General: No focal deficit present.  ?   Mental Status: She is alert and oriented to person, place, and time. Mental status is at baseline.  ? ? ?BP 135/65   Pulse 84   Ht '5\' 6"'$  (1.676 m)   Wt 206 lb 4.8 oz (93.6 kg)   BMI 33.30 kg/m?  ?Wt Readings from Last 3 Encounters:  ?05/04/21 206 lb 4.8 oz (93.6 kg)  ?04/05/21 205 lb 4.8 oz (93.1 kg)  ?03/31/21 204 lb 1.6 oz (92.6 kg)  ? ? ? ?Health Maintenance Due  ?Topic Date Due  ? Hepatitis C Screening  Never done  ? Zoster Vaccines- Shingrix (1 of 2) Never  done  ? COLONOSCOPY (Pts 45-74yr Insurance coverage will need to be confirmed)  Never done  ? Pneumonia Vaccine 72 Years old (1 - PCV) Never done  ? COVID-19 Vaccine (3 - Pfizer risk series) 06/30/2019

## 2021-05-06 LAB — URINE CULTURE

## 2021-05-08 ENCOUNTER — Other Ambulatory Visit (HOSPITAL_COMMUNITY): Payer: Self-pay

## 2021-05-08 ENCOUNTER — Other Ambulatory Visit: Payer: Self-pay | Admitting: Oncology

## 2021-05-08 NOTE — Telephone Encounter (Signed)
Component Ref Range & Units 1 mo ago ?(04/05/21) 1 mo ago ?(03/21/21) 2 mo ago ?(03/07/21) 3 mo ago ?(02/07/21) 3 mo ago ?(01/10/21) 4 mo ago ?(12/13/20) 5 mo ago ?(11/15/20)  ?Potassium 3.5 - 5.1 mmol/L 3.3 Low   4.3 R  2.8 Low   3.6  2.9 Low   3.8  3.4 Low    ? ?

## 2021-05-10 DIAGNOSIS — L259 Unspecified contact dermatitis, unspecified cause: Secondary | ICD-10-CM | POA: Insufficient documentation

## 2021-05-10 NOTE — Assessment & Plan Note (Signed)
Numerous rythematous papules on the forehead. ?Redness of eyes. ?Started on  Triamcinolone cream and olopatadine eyedrops.  ?

## 2021-05-10 NOTE — Assessment & Plan Note (Signed)
Urinalysis positive for leukocytes. ?Started on ciprofloxacin  500 mg. ?Increase fluid intake. ? ?

## 2021-05-11 ENCOUNTER — Inpatient Hospital Stay: Payer: Medicare Other | Admitting: Pharmacist

## 2021-05-11 ENCOUNTER — Inpatient Hospital Stay: Payer: Medicare Other | Attending: Oncology

## 2021-05-11 ENCOUNTER — Inpatient Hospital Stay: Payer: Medicare Other

## 2021-05-11 VITALS — BP 125/53 | HR 66 | Temp 97.1°F | Resp 16 | Ht 66.0 in | Wt 208.8 lb

## 2021-05-11 DIAGNOSIS — C7951 Secondary malignant neoplasm of bone: Secondary | ICD-10-CM | POA: Diagnosis not present

## 2021-05-11 DIAGNOSIS — C50912 Malignant neoplasm of unspecified site of left female breast: Secondary | ICD-10-CM

## 2021-05-11 DIAGNOSIS — Z5111 Encounter for antineoplastic chemotherapy: Secondary | ICD-10-CM | POA: Insufficient documentation

## 2021-05-11 DIAGNOSIS — C50919 Malignant neoplasm of unspecified site of unspecified female breast: Secondary | ICD-10-CM

## 2021-05-11 DIAGNOSIS — Z79899 Other long term (current) drug therapy: Secondary | ICD-10-CM | POA: Insufficient documentation

## 2021-05-11 LAB — CBC WITH DIFFERENTIAL/PLATELET
Abs Immature Granulocytes: 0 10*3/uL (ref 0.00–0.07)
Basophils Absolute: 0 10*3/uL (ref 0.0–0.1)
Basophils Relative: 1 %
Eosinophils Absolute: 0 10*3/uL (ref 0.0–0.5)
Eosinophils Relative: 1 %
HCT: 28.7 % — ABNORMAL LOW (ref 36.0–46.0)
Hemoglobin: 9.8 g/dL — ABNORMAL LOW (ref 12.0–15.0)
Immature Granulocytes: 0 %
Lymphocytes Relative: 23 %
Lymphs Abs: 0.4 10*3/uL — ABNORMAL LOW (ref 0.7–4.0)
MCH: 38 pg — ABNORMAL HIGH (ref 26.0–34.0)
MCHC: 34.1 g/dL (ref 30.0–36.0)
MCV: 111.2 fL — ABNORMAL HIGH (ref 80.0–100.0)
Monocytes Absolute: 0.2 10*3/uL (ref 0.1–1.0)
Monocytes Relative: 14 %
Neutro Abs: 1 10*3/uL — ABNORMAL LOW (ref 1.7–7.7)
Neutrophils Relative %: 61 %
Platelets: 123 10*3/uL — ABNORMAL LOW (ref 150–400)
RBC: 2.58 MIL/uL — ABNORMAL LOW (ref 3.87–5.11)
RDW: 15.1 % (ref 11.5–15.5)
WBC: 1.7 10*3/uL — ABNORMAL LOW (ref 4.0–10.5)
nRBC: 0 % (ref 0.0–0.2)

## 2021-05-11 LAB — COMPREHENSIVE METABOLIC PANEL
ALT: 16 U/L (ref 0–44)
AST: 29 U/L (ref 15–41)
Albumin: 4.1 g/dL (ref 3.5–5.0)
Alkaline Phosphatase: 63 U/L (ref 38–126)
Anion gap: 7 (ref 5–15)
BUN: 23 mg/dL (ref 8–23)
CO2: 26 mmol/L (ref 22–32)
Calcium: 9.5 mg/dL (ref 8.9–10.3)
Chloride: 104 mmol/L (ref 98–111)
Creatinine, Ser: 1.42 mg/dL — ABNORMAL HIGH (ref 0.44–1.00)
GFR, Estimated: 40 mL/min — ABNORMAL LOW (ref >60)
Glucose, Bld: 95 mg/dL (ref 70–99)
Potassium: 3.6 mmol/L (ref 3.5–5.1)
Sodium: 137 mmol/L (ref 135–145)
Total Bilirubin: 0.6 mg/dL (ref 0.3–1.2)
Total Protein: 7.4 g/dL (ref 6.5–8.1)

## 2021-05-11 MED ORDER — FULVESTRANT 250 MG/5ML IM SOSY
500.0000 mg | PREFILLED_SYRINGE | Freq: Once | INTRAMUSCULAR | Status: AC
  Administered 2021-05-11: 500 mg via INTRAMUSCULAR
  Filled 2021-05-11: qty 10

## 2021-05-11 NOTE — Progress Notes (Signed)
? ?[facility] ?[place]  ?[phone] [phone] ? ?Patient Care Team: ?[name], MD as PCP - General (Internal Medicine) ?Nelva Bush, [name] ?Rico Junker, RN as Registered Nurse ?Lloyd Huger, MD as Consulting Physician (Oncology) ?Robert Bellow, MD as Consulting Physician (General Surgery) ?Robert Bellow, MD as Consulting Physician (General Surgery) ?Noreene Filbert, MD as Referring Physician (Radiation Oncology) ?Jeral Fruit, RN as Registered Nurse  ? ?Name of the patient: Alicia [name]  ?[id number]  ?[birth year]  ? ?Date of visit: 05/11/21 ? ?HPI: Patient is a 71 y.o. female with recurrent stage IV ER/PR positive, HER2 negative breast cancer. Patient started on Ibrance on 07/09/19.  ? ?Reason for Consult: Oral chemotherapy follow-up for Ibrance (palbociclib) therapy. ? ? ?PAST MEDICAL HISTORY: ?Past Medical History:  ?Diagnosis Date  ? Anxiety   ? Breast cancer, left ([place]) 07/2017  ? Depression   ? Hypertension   ? Hypothyroidism   ? Personal history of chemotherapy 2019  ? LEFT mastectomy-chemo before  ? Personal history of radiation therapy 03/2018  ? LEFT mastectomy  ? Rapid heart rate   ? Thyroid disease   ? ? ?PAST SURGICAL HISTORY:  ?Past Surgical History:  ?Procedure Laterality Date  ? AXILLARY LYMPH NODE BIOPSY Left 07/16/2017  ? METASTATIC MAMMARY CARCINOMA  ? BREAST BIOPSY Left 07/16/2017  ? [place] bx of left breast mass and left breast LN.  INVASIVE MAMMARY CARCINOMA, NO SPECIAL TYPE.   ? BREAST EXCISIONAL BIOPSY Right 2001  ? benign  ? BREAST LUMPECTOMY WITH SENTINEL LYMPH NODE BIOPSY Left 12/06/2017  ? Procedure: BREAST LUMPECTOMY WITH SENTINEL LYMPH NODE BX;  Surgeon: Robert Bellow, MD;  Location: [facility] OR[facility];  Service: General;  Laterality: Left;  ? COLONOSCOPY    ? MASTECTOMY Left 12/23/2017  ? PORTACATH PLACEMENT Right 08/07/2017  ? Procedure: INSERTION PORT-A-CATH;  Surgeon:  Robert Bellow, MD;  Location: [facility] OR[facility];  Service: General;  Laterality: Right;  ? SIMPLE MASTECTOMY WITH AXILLARY SENTINEL NODE BIOPSY Left 12/23/2017  ? T2,N2 with 6/7 nodes positive. Whole breast radiation.  Surgeon: Robert Bellow, MD;  Location: [facility] OR[facility];  Service: General;  Laterality: Left;  ? TONSILLECTOMY    ? ? ?HEMATOLOGY/ONCOLOGY HISTORY:  ?Oncology History Overview Note  ?Patient is a 71 year old female who recently self palpated a mass on her left breast.  Subsequent imaging and biopsy revealed the above-stated breast cancer.  Case was also discussed extensively at case conference.  Given the size and the stage of patient's malignancy, she will benefit from neoadjuvant chemotherapy using Adriamycin, Cytoxan, and Taxol.  Patient will also require Neulasta support.  Will get CT scan of the chest, abdomen, and pelvis to assess for any metastatic disease.  Patient will also require port placement and MUGA prior to initiating treatment ? ?CT abdomen/pelvis/chest did not reveal any suspicious lesions concerning for metastatic disease. (08/01/17) ? ?Port-A-Cath placed on 08/07/2017. ? ?Cycle 1 day 1 of AC was given on 08/08/17.  ? ?  ?Breast cancer, stage 2, left ([place])  ?07/24/2017 Initial Diagnosis  ? Breast cancer, stage 2, left ([place]) ? ?  ?07/30/2017 Cancer Staging  ? Staging form: Breast, AJCC 8th Edition ?- Clinical stage from 07/30/2017: Stage IIA (cT2, cN1, cM0, G2, ER+, PR+, HER2-) - Signed by Lloyd Huger, MD on 07/30/2017 ? ?  ?08/08/2017 - 10/31/2017 Chemotherapy  ? The patient had DOXOrubicin (ADRIAMYCIN) chemo injection 130 mg, 60 mg/m2 = 130 mg, Intravenous,  Once, 4 of 4 cycles ?Administration: 130 mg (08/08/2017), 130 mg (08/22/2017), 130 mg (09/05/2017), 130 mg (09/19/2017) ?palonosetron (ALOXI) injection 0.25 mg, 0.25 mg, Intravenous,  Once, 4 of 4 cycles ?Administration: 0.25 mg (08/08/2017), 0.25 mg (08/22/2017), 0.25 mg (09/05/2017), 0.25 mg (09/19/2017) ?pegfilgrastim-cbqv (UDENYCA) injection  6 mg, 6 mg, Subcutaneous, Once, 4 of 4 cycles ?Administration: 6 mg (08/09/2017), 6 mg (08/23/2017), 6 mg (09/06/2017), 6 mg (09/20/2017) ?cyclophosphamide (CYTOXAN) 1,300 mg in sodium chloride 0.9 % 250 mL chemo infusion, 600 mg/m2 = 1,300 mg, Intravenous,  Once, 4 of 4 cycles ?Administration: 1,300 mg (08/08/2017), 1,300 mg (08/22/2017), 1,300 mg (09/05/2017), 1,300 mg (09/19/2017) ?PACLitaxel (TAXOL) 174 mg in sodium chloride 0.9 % 250 mL chemo infusion (</= 42m/m2), 80 mg/m2 = 174 mg, Intravenous,  Once, 4 of 12 cycles ?Dose modification: 72 mg/m2 (original dose 80 mg/m2, Cycle 6, Reason: Dose not tolerated) ?Administration: 174 mg (10/03/2017), 156 mg (10/17/2017), 156 mg (10/24/2017), 156 mg (10/31/2017) ?fosaprepitant (EMEND) 150 mg, dexamethasone (DECADRON) 12 mg in sodium chloride 0.9 % 145 mL IVPB, , Intravenous,  Once, 4 of 4 cycles ?Administration:  (08/08/2017),  (08/22/2017),  (09/05/2017),  (09/19/2017) ? ? for chemotherapy treatment.  ? ?  ? ? ?ALLERGIES:  is allergic to sulfa antibiotics. ? ?MEDICATIONS:  ?Current Outpatient Medications  ?Medication Sig Dispense Refill  ? acetaminophen (TYLENOL) 500 MG tablet Take 1,000 mg by mouth every 6 (six) hours as needed for moderate pain or headache.     ? ALPRAZolam (XANAX) 0.5 MG tablet Take 1 tablet (0.5 mg total) by mouth 2 (two) times daily. (Patient taking differently: Take 0.5 mg by mouth 2 (two) times daily. Taking once/day) 60 tablet 1  ? amLODipine (NORVASC) 5 MG tablet Take 1 tablet (5 mg total) by mouth daily. 90 tablet 3  ? aspirin EC 81 MG tablet Take 81 mg by mouth at bedtime.     ? calcium carbonate (OS-CAL - DOSED IN MG OF ELEMENTAL CALCIUM) 1250 (500 Ca) MG tablet Take 1 tablet by mouth daily with breakfast.    ? Cholecalciferol (VITAMIN D3) 125 MCG (5000 UT) CAPS Take 1 capsule by mouth 2 (two) times a day.    ? ciprofloxacin (CIPRO) 500 MG tablet Take 1 tablet (500 mg total) by mouth 2 (two) times daily for 10 days. 20 tablet 0  ? docusate sodium (COLACE)  100 MG capsule Take 100 mg by mouth 3 (three) times daily as needed for mild constipation.    ? hydrochlorothiazide (HYDRODIURIL) 12.5 MG tablet Take 1 tablet (12.5 mg total) by mouth daily. 90 tablet 3  ? levothyroxine (SYNTHROID) 100 MCG tablet TAKE 1 TABLET BY MOUTH DAILY 90 tablet 3  ? olopatadine (PATANOL) 0.1 % ophthalmic solution Place 1 drop into the right eye 2 (two) times daily. 5 mL 12  ? palbociclib (IBRANCE) 125 MG tablet Take 1 tablet (125 mg total) by mouth daily. Take for 21 days, then hold for 7 days. Repeat every 28 days. 21 tablet 3  ? potassium chloride SA (KLOR-CON M) 20 MEQ tablet TAKE 1 TABLET BY MOUTH TWICE DAILY 60 tablet 3  ? Probiotic Product (ALIGN PO) Take by mouth.    ? rosuvastatin (CRESTOR) 5 MG tablet TAKE 1 TABLET BY MOUTH DAILY 30 tablet 6  ? traZODone (DESYREL) 50 MG tablet TAKE 1 TABLET BY MOUTH DAILY 30 tablet 6  ? triamcinolone cream (KENALOG) 0.1 % Apply 1 application. topically 2 (two) times daily. 30 g 0  ? nystatin-lidocaine-prednisoLONE-diphenhydrAMINE-distilled water-alum & mag hydroxide-simeth  Swish and spit 5-57m four times a day as needed (Patient not taking: Reported on 05/11/2021) 480 mL 3  ? ?No current facility-administered medications for this visit.  ? ? ?VITAL SIGNS: ?BP (!) 125/53   Pulse 66   Temp (!) 97.1 ?F (36.2 ?C) (Tympanic)   Resp 16   Ht 5' 6"  (1.676 m)   Wt 94.7 kg (208 lb 12.8 oz)   SpO2 100%   BMI 33.70 kg/m?  ?Filed Weights  ? 05/11/21 1146  ?Weight: 94.7 kg (208 lb 12.8 oz)  ?  ?Estimated body mass index is 33.7 kg/m? as calculated from the following: ?  Height as of this encounter: 5' 6"  (1.676 m). ?  Weight as of this encounter: 94.7 kg (208 lb 12.8 oz). ? ?LABS: ?CBC: ?   ?Component Value Date/Time  ? WBC 1.7 (L) 05/11/2021 1101  ? HGB 9.8 (L) 05/11/2021 1101  ? HGB 14.2 02/11/2011 1349  ? HCT 28.7 (L) 05/11/2021 1101  ? HCT 40.9 02/11/2011 1349  ? PLT 123 (L) 05/11/2021 1101  ? PLT 151 02/11/2011 1349  ? MCV 111.2 (H) 05/11/2021 1101  ?  MCV 89 02/11/2011 1349  ? NEUTROABS 1.0 (L) 05/11/2021 1101  ? LYMPHSABS 0.4 (L) 05/11/2021 1101  ? MONOABS 0.2 05/11/2021 1101  ? EOSABS 0.0 05/11/2021 1101  ? BASOSABS 0.0 05/11/2021 1101  ? ?Comprehensive Metab

## 2021-05-12 LAB — CANCER ANTIGEN 27.29: CA 27.29: 83.8 U/mL — ABNORMAL HIGH (ref 0.0–38.6)

## 2021-05-16 ENCOUNTER — Other Ambulatory Visit: Payer: Self-pay | Admitting: Emergency Medicine

## 2021-05-16 DIAGNOSIS — Z20822 Contact with and (suspected) exposure to covid-19: Secondary | ICD-10-CM | POA: Diagnosis not present

## 2021-05-16 DIAGNOSIS — C50919 Malignant neoplasm of unspecified site of unspecified female breast: Secondary | ICD-10-CM

## 2021-05-17 ENCOUNTER — Inpatient Hospital Stay: Payer: Medicare Other | Attending: Oncology

## 2021-05-17 DIAGNOSIS — C7951 Secondary malignant neoplasm of bone: Secondary | ICD-10-CM | POA: Insufficient documentation

## 2021-05-17 DIAGNOSIS — C50912 Malignant neoplasm of unspecified site of left female breast: Secondary | ICD-10-CM | POA: Insufficient documentation

## 2021-05-17 DIAGNOSIS — Z170 Estrogen receptor positive status [ER+]: Secondary | ICD-10-CM | POA: Diagnosis not present

## 2021-05-17 DIAGNOSIS — C50919 Malignant neoplasm of unspecified site of unspecified female breast: Secondary | ICD-10-CM

## 2021-05-17 LAB — CBC WITH DIFFERENTIAL/PLATELET
Abs Immature Granulocytes: 0.03 10*3/uL (ref 0.00–0.07)
Basophils Absolute: 0.1 10*3/uL (ref 0.0–0.1)
Basophils Relative: 3 %
Eosinophils Absolute: 0 10*3/uL (ref 0.0–0.5)
Eosinophils Relative: 1 %
HCT: 31.6 % — ABNORMAL LOW (ref 36.0–46.0)
Hemoglobin: 10.5 g/dL — ABNORMAL LOW (ref 12.0–15.0)
Immature Granulocytes: 1 %
Lymphocytes Relative: 14 %
Lymphs Abs: 0.5 10*3/uL — ABNORMAL LOW (ref 0.7–4.0)
MCH: 36.7 pg — ABNORMAL HIGH (ref 26.0–34.0)
MCHC: 33.2 g/dL (ref 30.0–36.0)
MCV: 110.5 fL — ABNORMAL HIGH (ref 80.0–100.0)
Monocytes Absolute: 1.1 10*3/uL — ABNORMAL HIGH (ref 0.1–1.0)
Monocytes Relative: 36 %
Neutro Abs: 1.4 10*3/uL — ABNORMAL LOW (ref 1.7–7.7)
Neutrophils Relative %: 45 %
Platelets: 178 10*3/uL (ref 150–400)
RBC: 2.86 MIL/uL — ABNORMAL LOW (ref 3.87–5.11)
RDW: 14.6 % (ref 11.5–15.5)
WBC: 3.1 10*3/uL — ABNORMAL LOW (ref 4.0–10.5)
nRBC: 1.3 % — ABNORMAL HIGH (ref 0.0–0.2)

## 2021-05-18 ENCOUNTER — Inpatient Hospital Stay (HOSPITAL_BASED_OUTPATIENT_CLINIC_OR_DEPARTMENT_OTHER): Payer: Medicare Other | Admitting: Pharmacist

## 2021-05-18 DIAGNOSIS — C50919 Malignant neoplasm of unspecified site of unspecified female breast: Secondary | ICD-10-CM

## 2021-05-18 NOTE — Progress Notes (Signed)
? ?Oral Chemotherapy Clinic- Telephone Visit ?[facility]  ?[phone] [phone] ? ? ? ?I connected withNAME@ on 05/18/21 at  2:00 PM EDT by telephone and verified that I am speaking with the correct person using two identifiers. ? ?Name of the patient: Alicia [name]  ?[id number]  ?05/19/1950  ? ?Location: ?Patient: home ?Provider:  in office ?  ?I discussed the limitations, risks, security and privacy concerns of performing an evaluation and management service by telephone and the availability of in person appointments. I also discussed with the patient that there may be a patient responsible charge related to this service. The patient expressed understanding and agreed to proceed. ? ?HPI: Patient is a 71 y.o. female with recurrent stage IV ER/PR positive, HER2 negative breast cancer. Patient started on Ibrance on 07/09/19. ? ?Reason for Consult: Oral chemotherapy follow-up for palbociclib therapy. ? ? ?PAST MEDICAL HISTORY: ?Past Medical History:  ?Diagnosis Date  ? Anxiety   ? Breast cancer, left ([facility]) 07/2017  ? Depression   ? Hypertension   ? Hypothyroidism   ? Personal history of chemotherapy 2019  ? LEFT mastectomy-chemo before  ? Personal history of radiation therapy 03/2018  ? LEFT mastectomy  ? Rapid heart rate   ? Thyroid disease   ? ? ?HEMATOLOGY/ONCOLOGY HISTORY:  ?Oncology History Overview Note  ?Patient is a 67-year-old female who recently self palpated a mass on her left breast.  Subsequent imaging and biopsy revealed the above-stated breast cancer.  Case was also discussed extensively at case conference.  Given the size and the stage of patient's malignancy, she will benefit from neoadjuvant chemotherapy using Adriamycin, Cytoxan, and Taxol.  Patient will also require Neulasta support.  Will get CT scan of the chest, abdomen, and pelvis to assess for any metastatic disease.  Patient will also require port placement and MUGA prior to initiating treatment ? ?CT  abdomen/pelvis/chest did not reveal any suspicious lesions concerning for metastatic disease. (08/01/17) ? ?Port-A-Cath placed on 08/07/2017. ? ?Cycle 1 day 1 of AC was given on 08/08/17.  ? ?  ?Breast cancer, stage 2, left ([facility])  ?07/24/2017 Initial Diagnosis  ? Breast cancer, stage 2, left ([facility]) ? ?  ?07/30/2017 Cancer Staging  ? Staging form: Breast, AJCC 8th Edition ?- Clinical stage from 07/30/2017: Stage IIA (cT2, cN1, cM0, G2, ER+, PR+, HER2-) - Signed by Finnegan, Timothy J, MD on 07/30/2017 ? ?  ?08/08/2017 - 10/31/2017 Chemotherapy  ? The patient had DOXOrubicin (ADRIAMYCIN) chemo injection 130 mg, 60 mg/m2 = 130 mg, Intravenous,  Once, 4 of 4 cycles ?Administration: 130 mg (08/08/2017), 130 mg (08/22/2017), 130 mg (09/05/2017), 130 mg (09/19/2017) ?palonosetron (ALOXI) injection 0.25 mg, 0.25 mg, Intravenous,  Once, 4 of 4 cycles ?Administration: 0.25 mg (08/08/2017), 0.25 mg (08/22/2017), 0.25 mg (09/05/2017), 0.25 mg (09/19/2017) ?pegfilgrastim-cbqv (UDENYCA) injection 6 mg, 6 mg, Subcutaneous, Once, 4 of 4 cycles ?Administration: 6 mg (08/09/2017), 6 mg (08/23/2017), 6 mg (09/06/2017), 6 mg (09/20/2017) ?cyclophosphamide (CYTOXAN) 1,300 mg in sodium chloride 0.9 % 250 mL chemo infusion, 600 mg/m2 = 1,300 mg, Intravenous,  Once, 4 of 4 cycles ?Administration: 1,300 mg (08/08/2017), 1,300 mg (08/22/2017), 1,300 mg (09/05/2017), 1,300 mg (09/19/2017) ?PACLitaxel (TAXOL) 174 mg in sodium chloride 0.9 % 250 mL chemo infusion (</= 80mg/m2), 80 mg/m2 = 174 mg, Intravenous,  Once, 4 of 12 cycles ?Dose modification: 72 mg/m2 (original dose 80 mg/m2, Cycle 6, Reason: Dose not tolerated) ?Administration: 174 mg (10/03/2017), 156 mg (10/17/2017), 156 mg (10/24/2017), 156 mg (10/31/2017) ?fosaprepitant (  EMEND) 150 mg, dexamethasone (DECADRON) 12 mg in sodium chloride 0.9 % 145 mL IVPB, , Intravenous,  Once, 4 of 4 cycles ?Administration:  (08/08/2017),  (08/22/2017),  (09/05/2017),  (09/19/2017) ? ? for chemotherapy treatment.  ? ?  ? ? ?ALLERGIES:  is  allergic to sulfa antibiotics. ? ?MEDICATIONS:  ?Current Outpatient Medications  ?Medication Sig Dispense Refill  ? acetaminophen (TYLENOL) 500 MG tablet Take 1,000 mg by mouth every 6 (six) hours as needed for moderate pain or headache.     ? ALPRAZolam (XANAX) 0.5 MG tablet Take 1 tablet (0.5 mg total) by mouth 2 (two) times daily. (Patient taking differently: Take 0.5 mg by mouth 2 (two) times daily. Taking once/day) 60 tablet 1  ? amLODipine (NORVASC) 5 MG tablet Take 1 tablet (5 mg total) by mouth daily. 90 tablet 3  ? aspirin EC 81 MG tablet Take 81 mg by mouth at bedtime.     ? calcium carbonate (OS-CAL - DOSED IN MG OF ELEMENTAL CALCIUM) 1250 (500 Ca) MG tablet Take 1 tablet by mouth daily with breakfast.    ? Cholecalciferol (VITAMIN D3) 125 MCG (5000 UT) CAPS Take 1 capsule by mouth 2 (two) times a day.    ? docusate sodium (COLACE) 100 MG capsule Take 100 mg by mouth 3 (three) times daily as needed for mild constipation.    ? hydrochlorothiazide (HYDRODIURIL) 12.5 MG tablet Take 1 tablet (12.5 mg total) by mouth daily. 90 tablet 3  ? levothyroxine (SYNTHROID) 100 MCG tablet TAKE 1 TABLET BY MOUTH DAILY 90 tablet 3  ? nystatin-lidocaine-prednisoLONE-diphenhydrAMINE-distilled water-alum & mag hydroxide-simeth Swish and spit 5-10ml four times a day as needed (Patient not taking: Reported on 05/11/2021) 480 mL 3  ? olopatadine (PATANOL) 0.1 % ophthalmic solution Place 1 drop into the right eye 2 (two) times daily. 5 mL 12  ? palbociclib (IBRANCE) 125 MG tablet Take 1 tablet (125 mg total) by mouth daily. Take for 21 days, then hold for 7 days. Repeat every 28 days. 21 tablet 3  ? potassium chloride SA (KLOR-CON M) 20 MEQ tablet TAKE 1 TABLET BY MOUTH TWICE DAILY 60 tablet 3  ? Probiotic Product (ALIGN PO) Take by mouth.    ? rosuvastatin (CRESTOR) 5 MG tablet TAKE 1 TABLET BY MOUTH DAILY 30 tablet 6  ? traZODone (DESYREL) 50 MG tablet TAKE 1 TABLET BY MOUTH DAILY 30 tablet 6  ? triamcinolone cream (KENALOG)  0.1 % Apply 1 application. topically 2 (two) times daily. 30 g 0  ? ?No current facility-administered medications for this visit.  ? ? ?VITAL SIGNS: ?There were no vitals taken for this visit. ?There were no vitals filed for this visit.  ?Estimated body mass index is 33.7 kg/m? as calculated from the following: ?  Height as of 05/11/21: 5' 6" (1.676 m). ?  Weight as of 05/11/21: 94.7 kg (208 lb 12.8 oz). ? ?LABS: ?CBC: ?   ?Component Value Date/Time  ? WBC 3.1 (L) 05/17/2021 1355  ? HGB 10.5 (L) 05/17/2021 1355  ? HGB 14.2 02/11/2011 1349  ? HCT 31.6 (L) 05/17/2021 1355  ? HCT 40.9 02/11/2011 1349  ? PLT 178 05/17/2021 1355  ? PLT 151 02/11/2011 1349  ? MCV 110.5 (H) 05/17/2021 1355  ? MCV 89 02/11/2011 1349  ? NEUTROABS 1.4 (L) 05/17/2021 1355  ? LYMPHSABS 0.5 (L) 05/17/2021 1355  ? MONOABS 1.1 (H) 05/17/2021 1355  ? EOSABS 0.0 05/17/2021 1355  ? BASOSABS 0.1 05/17/2021 1355  ? ?Comprehensive Metabolic Panel: ?   ?  Component Value Date/Time  ? NA 137 05/11/2021 1101  ? NA 142 02/11/2011 1349  ? K 3.6 05/11/2021 1101  ? K 3.8 02/11/2011 1349  ? CL 104 05/11/2021 1101  ? CL 107 02/11/2011 1349  ? CO2 26 05/11/2021 1101  ? CO2 24 02/11/2011 1349  ? BUN 23 05/11/2021 1101  ? BUN 14 02/11/2011 1349  ? CREATININE 1.42 (H) 05/11/2021 1101  ? CREATININE 0.65 02/11/2011 1349  ? GLUCOSE 95 05/11/2021 1101  ? GLUCOSE 98 02/11/2011 1349  ? CALCIUM 9.5 05/11/2021 1101  ? CALCIUM 9.0 02/11/2011 1349  ? AST 29 05/11/2021 1101  ? AST 27 02/11/2011 1349  ? ALT 16 05/11/2021 1101  ? ALT 36 02/11/2011 1349  ? ALKPHOS 63 05/11/2021 1101  ? ALKPHOS 122 02/11/2011 1349  ? BILITOT 0.6 05/11/2021 1101  ? BILITOT 0.5 02/11/2011 1349  ? PROT 7.4 05/11/2021 1101  ? PROT 7.0 02/11/2011 1349  ? ALBUMIN 4.1 05/11/2021 1101  ? ALBUMIN 4.1 02/11/2011 1349  ? ? ?On phone during today's visit: patient only ? ?Assessment and Plan-  ?Reviewed CBC from 05/17/21 with patient, counts have improved since last week ?Resume palbociclib 168m tomorrow 05/19/21  (patient usually starts her palbociclib on Fridays) ?  ?Patient expressed understanding and was in agreement with this plan. She also understands that She can call clinic at any time with any questions, concern

## 2021-05-31 ENCOUNTER — Other Ambulatory Visit (HOSPITAL_COMMUNITY): Payer: Self-pay

## 2021-06-01 ENCOUNTER — Telehealth: Payer: Self-pay | Admitting: Internal Medicine

## 2021-06-01 NOTE — Telephone Encounter (Signed)
Made 3 attempts to schedule, removing from recall list. 

## 2021-06-05 ENCOUNTER — Other Ambulatory Visit (HOSPITAL_COMMUNITY): Payer: Self-pay

## 2021-06-08 ENCOUNTER — Ambulatory Visit: Payer: Medicare Other

## 2021-06-08 ENCOUNTER — Ambulatory Visit: Payer: Medicare Other | Admitting: Pharmacist

## 2021-06-08 ENCOUNTER — Other Ambulatory Visit (HOSPITAL_COMMUNITY): Payer: Self-pay

## 2021-06-08 ENCOUNTER — Ambulatory Visit: Payer: Medicare Other | Admitting: Oncology

## 2021-06-08 ENCOUNTER — Other Ambulatory Visit: Payer: Medicare Other

## 2021-06-09 NOTE — Progress Notes (Unsigned)
Alicia [name]  [phone]) [phone] [phone]) [phone]  ID: Alicia [name] OB: 04-17-1950  MR#: [id number]  GOT#:[id number]  Patient Care Team: [name], [name], Harrell Gave, [name] Rico Junker, RN as Registered Nurse Lloyd Huger, MD as Consulting Physician (Oncology) Bary Castilla Forest Gleason, MD as Consulting Physician (General Surgery) Bary Castilla Forest Gleason, MD as Consulting Physician (General Surgery) Noreene Filbert, MD as Referring Physician (Radiation Oncology) Jeral Fruit, RN as Registered Nurse   CHIEF COMPLAINT: Recurrent stage IV ER/PR positive, 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  INTERVAL HISTORY: Patient returns to clinic today for further evaluation and continuation of Ibrance and Faslodex.  She finally was able to see a dentist about her tooth abscess and this has essentially resolved.  She currently feels well.  She does not complain of weakness or fatigue today.  She continues to have a mild peripheral neuropathy, but no other neurologic complaints.  She denies any chest pain, shortness of breath, cough, or hemoptysis.  She denies any nausea, vomiting, constipation, or diarrhea.  She has no urinary complaints.  Patient offers no further specific complaints today.  REVIEW OF SYSTEMS:   Review of Systems  Constitutional: Negative.  Negative for fever, malaise/fatigue and weight loss.  HENT:  Negative for congestion.   Respiratory: Negative.  Negative for cough and shortness of breath.   Cardiovascular: Negative.  Negative for chest pain and leg swelling.  Gastrointestinal: Negative.  Negative for abdominal pain, constipation, diarrhea and nausea.  Genitourinary: Negative.  Negative for dysuria and urgency.  Musculoskeletal: Negative.  Negative for back pain and joint pain.  Skin: Negative.  Negative for rash.  Neurological:  Positive for tingling  and sensory change. Negative for dizziness, focal weakness, weakness and headaches.  Psychiatric/Behavioral:  The patient is not nervous/anxious.    As per HPI. Otherwise, a complete review of systems is negative.  PAST MEDICAL HISTORY: Past Medical History:  Diagnosis Date   Anxiety    Breast cancer, left ([place]) 07/2017   Depression    Hypertension    Hypothyroidism    Personal history of chemotherapy 2019   LEFT mastectomy-chemo before   Personal history of radiation therapy 03/2018   LEFT mastectomy   Rapid heart rate    Thyroid disease     PAST SURGICAL HISTORY: Past Surgical History:  Procedure Laterality Date   AXILLARY LYMPH NODE BIOPSY Left 07/16/2017   METASTATIC MAMMARY CARCINOMA   BREAST BIOPSY Left 07/16/2017   [place] bx of left breast mass and left breast LN.  INVASIVE MAMMARY CARCINOMA, NO SPECIAL TYPE.    BREAST EXCISIONAL BIOPSY Right 2001   benign   BREAST LUMPECTOMY WITH SENTINEL LYMPH NODE BIOPSY Left 12/06/2017   Procedure: BREAST LUMPECTOMY WITH SENTINEL LYMPH NODE BX;  Surgeon: Robert Bellow, MD;  Location: [facility] OR[facility];  Service: General;  Laterality: Left;   COLONOSCOPY     MASTECTOMY Left 12/23/2017   PORTACATH PLACEMENT Right 08/07/2017   Procedure: INSERTION PORT-A-CATH;  Surgeon: Robert Bellow, MD;  Location: [facility];  Service: General;  Laterality: Right;   SIMPLE MASTECTOMY WITH AXILLARY SENTINEL NODE BIOPSY Left 12/23/2017   T2,N2 with 6/7 nodes positive. Whole breast radiation.  Surgeon: Robert Bellow, MD;  Location: [facility] OR[facility];  Service: General;  Laterality: Left;   TONSILLECTOMY      FAMILY HISTORY: Family History  Problem Relation Age of Onset   Stroke Mother    Thyroid  disease Mother    Renal Disease Mother    Stroke Father    Heart attack Father    Sudden death Father 35       suicide   Anuerysm Brother    Breast cancer Neg Hx     ADVANCED DIRECTIVES (Y/N):  N  HEALTH MAINTENANCE: Social History   Tobacco Use    Smoking status: Former    Packs/day: 1.00    Years: 30.00    Pack years: 30.00    Types: Cigarettes    Quit date: 2019    Years since quitting: 4.4   Smokeless tobacco: Never  Vaping Use   Vaping Use: Never used  Substance Use Topics   Alcohol use: Not Currently   Drug use: Never     Colonoscopy:  PAP:  Bone density:  Lipid panel:  Allergies  Allergen Reactions   Sulfa Antibiotics Diarrhea    Current Outpatient Medications  Medication Sig Dispense Refill   acetaminophen (TYLENOL) 500 MG tablet Take 1,000 mg by mouth every 6 (six) hours as needed for moderate pain or headache.      ALPRAZolam (XANAX) 0.5 MG tablet Take 1 tablet (0.5 mg total) by mouth 2 (two) times daily. (Patient taking differently: Take 0.5 mg by mouth 2 (two) times daily. Taking once/day) 60 tablet 1   amLODipine (NORVASC) 5 MG tablet Take 1 tablet (5 mg total) by mouth daily. 90 tablet 3   aspirin EC 81 MG tablet Take 81 mg by mouth at bedtime.      calcium carbonate (OS-CAL - DOSED IN MG OF ELEMENTAL CALCIUM) 1250 (500 Ca) MG tablet Take 1 tablet by mouth daily with breakfast.     Cholecalciferol (VITAMIN D3) 125 MCG (5000 UT) CAPS Take 1 capsule by mouth 2 (two) times a day.     docusate sodium (COLACE) 100 MG capsule Take 100 mg by mouth 3 (three) times daily as needed for mild constipation.     hydrochlorothiazide (HYDRODIURIL) 12.5 MG tablet Take 1 tablet (12.5 mg total) by mouth daily. 90 tablet 3   levothyroxine (SYNTHROID) 100 MCG tablet TAKE 1 TABLET BY MOUTH DAILY 90 tablet 3   nystatin-lidocaine-prednisoLONE-diphenhydrAMINE-distilled water-alum & mag hydroxide-simeth Swish and spit 5-56m four times a day as needed (Patient not taking: Reported on 05/11/2021) 480 mL 3   olopatadine (PATANOL) 0.1 % ophthalmic solution Place 1 drop into the right eye 2 (two) times daily. 5 mL 12   palbociclib (IBRANCE) 125 MG tablet Take 1 tablet (125 mg total) by mouth daily. Take for 21 days, then hold for 7 days.  Repeat every 28 days. 21 tablet 3   potassium chloride SA (KLOR-CON M) 20 MEQ tablet TAKE 1 TABLET BY MOUTH TWICE DAILY 60 tablet 3   Probiotic Product (ALIGN PO) Take by mouth.     rosuvastatin (CRESTOR) 5 MG tablet TAKE 1 TABLET BY MOUTH DAILY 30 tablet 6   traZODone (DESYREL) 50 MG tablet TAKE 1 TABLET BY MOUTH DAILY 30 tablet 6   triamcinolone cream (KENALOG) 0.1 % Apply 1 application. topically 2 (two) times daily. 30 g 0   No current facility-administered medications for this visit.    OBJECTIVE: There were no vitals filed for this visit.    There is no height or weight on file to calculate BMI.    ECOG FS:0 - Asymptomatic  General: Well-developed, well-nourished, no acute distress. Eyes: Pink conjunctiva, anicteric sclera. HEENT: Normocephalic, moist mucous membranes. Lungs: No audible wheezing or coughing. Heart: Regular  rate and rhythm. Abdomen: Soft, nontender, no obvious distention. Musculoskeletal: No edema, cyanosis, or clubbing. Neuro: Alert, answering all questions appropriately. Cranial nerves grossly intact. Skin: No rashes or petechiae noted. Psych: Normal affect.   LAB RESULTS:  Lab Results  Component Value Date   NA 137 05/11/2021   K 3.6 05/11/2021   CL 104 05/11/2021   CO2 26 05/11/2021   GLUCOSE 95 05/11/2021   BUN 23 05/11/2021   CREATININE 1.42 (H) 05/11/2021   CALCIUM 9.5 05/11/2021   PROT 7.4 05/11/2021   ALBUMIN 4.1 05/11/2021   AST 29 05/11/2021   ALT 16 05/11/2021   ALKPHOS 63 05/11/2021   BILITOT 0.6 05/11/2021   GFRNONAA 40 (L) 05/11/2021   GFRAA 57 (L) 10/01/2019    Lab Results  Component Value Date   WBC 3.1 (L) 05/17/2021   NEUTROABS 1.4 (L) 05/17/2021   HGB 10.5 (L) 05/17/2021   HCT 31.6 (L) 05/17/2021   MCV 110.5 (H) 05/17/2021   PLT 178 05/17/2021     STUDIES: No results found.   ONCOLOGY HISTORY: Patient initially received neoadjuvant chemotherapy with Adriamycin and Cytoxan followed by weekly Taxol. She only  received 4 cycles of weekly Taxol prior to discontinuation of treatment on October 31, 2017 secondary to persistent peripheral neuropathy.  She ultimately required mastectomy and final pathology noted 5 of 6 lymph nodes positive for disease.  Patient completed adjuvant XRT in mid April 2020.  Pain initiated letrozole in May 2020, this was discontinued secondary to side effects and patient was started on anastrozole.  Nuclear medicine bone scan on June 19, 2019 revealed metastatic disease and anastrozole was subsequently discontinued.  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  PLAN:    1. 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  MRI of the brain on August 24, 2019 reviewed independently with no obvious evidence of metastatic disease.  Repeat PET scan on March 02, 2021 did not reveal any evidence of metastatic disease, but showed worsening lesion in her right jaw.  Patient's CA 27-29 initially increased to 141.9, but appears to have plateaued since November 2021 between 46.9 and 87.6.  Today's result is 82.3. Despite persistent, mild neutropenia, will continue 125 mg Ibrance for 21 days with 7 days off.  Proceed with Faslodex today.  Continue to hold Xgeva at this time given her issues with her mandible.  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  Appreciate clinical pharmacy input. 2.  Peripheral neuropathy: Chronic and unchanged. 3.  History of pulmonary embolus: Patient was diagnosed with a small pulmonary embolus on January 10, 2018.  She is no longer on anticoagulation.  There was  no evidence of recurrent PE on CT scan from October 27, 2019. 4.  Osteopenia: Patient's most recent bone mineral density on March 03, 2019 reported a T score of -1.8 which is only mildly decreased from 1 year prior when the reported T score was -1.6.  Continue calcium and vitamin D supplementation.  Hold Xgeva as above. 5.  Left hip pain: Patient does not complain of this today.  Continue symptomatic treatment.  Patient has had XRT to this area. 6.  Leukopenia:  Slightly worse today.  Patient's total white cell count is 1.5 today.  This has ranged between 1.6 and 3.2 since July 2021.   7.  Anemia: Chronic and unchanged.  Patient's hemoglobin is 9.0. 8.  Renal insufficiency: Creatinine has improved to 1.23. 9.  Hypokalemia: Improved, but still decreased.  Continue oral potassium supplementation. 10.  UTI: Resolved. 11.  Jaw lesion: Resolved. 12.  Thrombocytopenia: Mild, monitor.  Patient expressed understanding and was in agreement with this plan. She also understands that She can call clinic at any time with any questions, concerns, or complaints.    Cancer Staging  Breast cancer, stage 2, left ([place]) Staging form: Breast, AJCC 8th Edition - Clinical stage from 07/30/2017: Stage IIA (cT2, cN1, cM0, G2, ER+, PR+, HER2-) - Signed by Lloyd Huger, MD on 07/30/2017 Histologic grading system: 3 grade system Laterality: Left   Lloyd Huger, MD   06/09/2021 10:42 AM

## 2021-06-14 ENCOUNTER — Other Ambulatory Visit: Payer: Self-pay

## 2021-06-14 ENCOUNTER — Ambulatory Visit
Admission: RE | Admit: 2021-06-14 | Discharge: 2021-06-14 | Disposition: A | Discharge status: Home / Self Care | Payer: Medicare Other | Source: Ambulatory Visit | Attending: Radiation Oncology | Admitting: Radiation Oncology

## 2021-06-14 VITALS — BP 140/79 | HR 83 | Temp 98.1°F | Resp 16 | Wt 212.6 lb

## 2021-06-14 DIAGNOSIS — C50919 Malignant neoplasm of unspecified site of unspecified female breast: Secondary | ICD-10-CM

## 2021-06-14 DIAGNOSIS — Z170 Estrogen receptor positive status [ER+]: Secondary | ICD-10-CM | POA: Insufficient documentation

## 2021-06-14 DIAGNOSIS — C7951 Secondary malignant neoplasm of bone: Secondary | ICD-10-CM | POA: Diagnosis not present

## 2021-06-14 DIAGNOSIS — C50412 Malignant neoplasm of upper-outer quadrant of left female breast: Secondary | ICD-10-CM | POA: Insufficient documentation

## 2021-06-14 DIAGNOSIS — Z923 Personal history of irradiation: Secondary | ICD-10-CM | POA: Insufficient documentation

## 2021-06-14 DIAGNOSIS — Z08 Encounter for follow-up examination after completed treatment for malignant neoplasm: Secondary | ICD-10-CM | POA: Diagnosis not present

## 2021-06-14 NOTE — Progress Notes (Signed)
Radiation Oncology Follow up Note  Name: Alicia [name]   Date:   06/14/2021 MRN:  [id number] DOB: 1950-01-19    This 71 y.o. female presents to the clinic today for 3-year follow-up inpatient with stage IV breast cancer status post palliative radiation therapy to her left hip and scapula.Marland Kitchen  REFERRING PROVIDER: [name], MD  HPI: Patient is a 71 year old female now out 3 years having completed palliative radiation therapy to her left hip humerus and scapula for stage IV invasive mammary carcinoma.  She is seen today in follow-up.  She had a PET scan showing a left iliac wing hypermetabolic bone met as well as lesion of the.  Right mandibular ramus.  She has been seeing Dr. Nicholos Johns dentist who is taking care of of some cavities she is having no significant pain in her jaw at this time.  She also specifically denies any left hip or pelvic pain.  She is currently being treated with Ibrance and PhosLo Dex.  COMPLICATIONS OF TREATMENT: none  FOLLOW UP COMPLIANCE: keeps appointments   PHYSICAL EXAM:  BP 140/79 (BP Location: Right Arm, Patient Position: Sitting, Cuff Size: Normal)   Pulse 83   Temp 98.1 F (36.7 C) (Tympanic)   Resp 16   Wt 212 lb 9.6 oz (96.4 kg)   BMI 34.31 kg/m  The palpation of her jaw does not elicit pain.  Deep palpation of her iliac wings do not elicit pain.  Well-developed well-nourished patient in NAD. HEENT reveals PERLA, EOMI, discs not visualized.  Oral cavity is clear. No oral mucosal lesions are identified. Neck is clear without evidence of cervical or supraclavicular adenopathy. Lungs are clear to A&P. Cardiac examination is essentially unremarkable with regular rate and rhythm without murmur rub or thrill. Abdomen is benign with no organomegaly or masses noted. Motor sensory and DTR levels are equal and symmetric in the upper and lower extremities. Cranial nerves II through XII are grossly intact. Proprioception is intact. No peripheral adenopathy or edema  is identified. No motor or sensory levels are noted. Crude visual fields are within normal range.  RADIOLOGY RESULTS: PET CT scan reviewed compatible with above-stated findings  PLAN: At this time I will continue to observe her lesion of the right mandibular ramus.  Could offer palliative radiation therapy although we will keep a close eye on this and she will alert [place] if this is causing pain in the future.  I have asked to see her back in 3 months.  She also is alerted to the left iliac wing lesion and will alert [place] if that is giving her any problems.  Patient is to call with any concerns.  34-monthfollow-up appoint was given.  I would like to take this opportunity to thank you for allowing me to participate in the care of your patient..Noreene Filbert MD

## 2021-06-15 ENCOUNTER — Inpatient Hospital Stay: Payer: Medicare Other

## 2021-06-15 ENCOUNTER — Inpatient Hospital Stay: Payer: Medicare Other | Attending: Oncology | Admitting: Oncology

## 2021-06-15 ENCOUNTER — Inpatient Hospital Stay: Payer: Medicare Other | Admitting: Pharmacist

## 2021-06-15 ENCOUNTER — Encounter: Payer: Self-pay | Admitting: Oncology

## 2021-06-15 VITALS — BP 111/70 | HR 72 | Temp 96.6°F | Resp 19 | Wt 213.1 lb

## 2021-06-15 DIAGNOSIS — C50912 Malignant neoplasm of unspecified site of left female breast: Secondary | ICD-10-CM

## 2021-06-15 DIAGNOSIS — Z79899 Other long term (current) drug therapy: Secondary | ICD-10-CM | POA: Insufficient documentation

## 2021-06-15 DIAGNOSIS — C50919 Malignant neoplasm of unspecified site of unspecified female breast: Secondary | ICD-10-CM

## 2021-06-15 DIAGNOSIS — Z5111 Encounter for antineoplastic chemotherapy: Secondary | ICD-10-CM | POA: Diagnosis not present

## 2021-06-15 DIAGNOSIS — C7951 Secondary malignant neoplasm of bone: Secondary | ICD-10-CM

## 2021-06-15 LAB — CBC WITH DIFFERENTIAL/PLATELET
Abs Immature Granulocytes: 0.01 10*3/uL (ref 0.00–0.07)
Basophils Absolute: 0 10*3/uL (ref 0.0–0.1)
Basophils Relative: 1 %
Eosinophils Absolute: 0 10*3/uL (ref 0.0–0.5)
Eosinophils Relative: 1 %
HCT: 30 % — ABNORMAL LOW (ref 36.0–46.0)
Hemoglobin: 10.3 g/dL — ABNORMAL LOW (ref 12.0–15.0)
Immature Granulocytes: 1 %
Lymphocytes Relative: 25 %
Lymphs Abs: 0.4 10*3/uL — ABNORMAL LOW (ref 0.7–4.0)
MCH: 36.8 pg — ABNORMAL HIGH (ref 26.0–34.0)
MCHC: 34.3 g/dL (ref 30.0–36.0)
MCV: 107.1 fL — ABNORMAL HIGH (ref 80.0–100.0)
Monocytes Absolute: 0.3 10*3/uL (ref 0.1–1.0)
Monocytes Relative: 16 %
Neutro Abs: 0.9 10*3/uL — ABNORMAL LOW (ref 1.7–7.7)
Neutrophils Relative %: 56 %
Platelets: 166 10*3/uL (ref 150–400)
RBC: 2.8 MIL/uL — ABNORMAL LOW (ref 3.87–5.11)
RDW: 14.8 % (ref 11.5–15.5)
WBC: 1.6 10*3/uL — ABNORMAL LOW (ref 4.0–10.5)
nRBC: 0 % (ref 0.0–0.2)

## 2021-06-15 LAB — COMPREHENSIVE METABOLIC PANEL
ALT: 14 U/L (ref 0–44)
AST: 22 U/L (ref 15–41)
Albumin: 4 g/dL (ref 3.5–5.0)
Alkaline Phosphatase: 64 U/L (ref 38–126)
Anion gap: 7 (ref 5–15)
BUN: 23 mg/dL (ref 8–23)
CO2: 26 mmol/L (ref 22–32)
Calcium: 8.9 mg/dL (ref 8.9–10.3)
Chloride: 101 mmol/L (ref 98–111)
Creatinine, Ser: 1.27 mg/dL — ABNORMAL HIGH (ref 0.44–1.00)
GFR, Estimated: 45 mL/min — ABNORMAL LOW (ref >60)
Glucose, Bld: 116 mg/dL — ABNORMAL HIGH (ref 70–99)
Potassium: 3.3 mmol/L — ABNORMAL LOW (ref 3.5–5.1)
Sodium: 134 mmol/L — ABNORMAL LOW (ref 135–145)
Total Bilirubin: 0.8 mg/dL (ref 0.3–1.2)
Total Protein: 7.6 g/dL (ref 6.5–8.1)

## 2021-06-15 MED ORDER — FULVESTRANT 250 MG/5ML IM SOSY
500.0000 mg | PREFILLED_SYRINGE | Freq: Once | INTRAMUSCULAR | Status: AC
  Administered 2021-06-15: 500 mg via INTRAMUSCULAR
  Filled 2021-06-15: qty 10

## 2021-06-15 NOTE — Addendum Note (Signed)
Addended by: Delice Bison E on: 06/15/2021 03:48 PM   Modules accepted: Orders

## 2021-06-15 NOTE — Progress Notes (Signed)
[place]  [phone]) [phone] [phone]) [phone]  Patient Care Team: [name], [name], Harrell Gave, [name] Rico Junker, RN as Registered Nurse Grayland Ormond Kathlene November, MD as Consulting Physician (Oncology) Bary Castilla Forest Gleason, MD as Consulting Physician (General Surgery) Bary Castilla Forest Gleason, MD as Consulting Physician (General Surgery) Noreene Filbert, MD as Referring Physician (Radiation Oncology) Jeral Fruit, RN as Registered Nurse   Name of the patient: Alicia [name]  [id number]  Feb 24, 1950   Date of visit: 06/15/21  HPI: Patient is a 71 y.o. female with recurrent stage IV ER/PR positive, HER2 negative breast cancer. Patient started on Ibrance on 07/09/19.   Reason for Consult: Oral chemotherapy follow-up for Ibrance (palbociclib) therapy.   PAST MEDICAL HISTORY: Past Medical History:  Diagnosis Date   Anxiety    Breast cancer, left ([place]) 07/2017   Depression    Hypertension    Hypothyroidism    Personal history of chemotherapy 2019   LEFT mastectomy-chemo before   Personal history of radiation therapy 03/2018   LEFT mastectomy   Rapid heart rate    Thyroid disease     PAST SURGICAL HISTORY:  Past Surgical History:  Procedure Laterality Date   AXILLARY LYMPH NODE BIOPSY Left 07/16/2017   METASTATIC MAMMARY CARCINOMA   BREAST BIOPSY Left 07/16/2017   [place] bx of left breast mass and left breast LN.  INVASIVE MAMMARY CARCINOMA, NO SPECIAL TYPE.    BREAST EXCISIONAL BIOPSY Right 2001   benign   BREAST LUMPECTOMY WITH SENTINEL LYMPH NODE BIOPSY Left 12/06/2017   Procedure: BREAST LUMPECTOMY WITH SENTINEL LYMPH NODE BX;  Surgeon: Robert Bellow, MD;  Location: [facility] ORS;  Service: General;  Laterality: Left;   COLONOSCOPY     MASTECTOMY Left 12/23/2017   PORTACATH PLACEMENT Right 08/07/2017   Procedure: INSERTION PORT-A-CATH;  Surgeon:  Robert Bellow, MD;  Location: [facility] ORS;  Service: General;  Laterality: Right;   SIMPLE MASTECTOMY WITH AXILLARY SENTINEL NODE BIOPSY Left 12/23/2017   T2,N2 with 6/7 nodes positive. Whole breast radiation.  Surgeon: Robert Bellow, MD;  Location: [facility] ORS;  Service: General;  Laterality: Left;   TONSILLECTOMY      HEMATOLOGY/ONCOLOGY HISTORY:  Oncology History Overview Note  Patient is a 71 year old female who recently self palpated a mass on her left breast.  Subsequent imaging and biopsy revealed the above-stated breast cancer.  Case was also discussed extensively at case conference.  Given the size and the stage of patient's malignancy, she will benefit from neoadjuvant chemotherapy using Adriamycin, Cytoxan, and Taxol.  Patient will also require Neulasta support.  Will get CT scan of the chest, abdomen, and pelvis to assess for any metastatic disease.  Patient will also require port placement and MUGA prior to initiating treatment  CT abdomen/pelvis/chest did not reveal any suspicious lesions concerning for metastatic disease. (08/01/17)  Port-A-Cath placed on 08/07/2017.  Cycle 1 day 1 of AC was given on 08/08/17.     Breast cancer, stage 2, left ([place])  07/24/2017 Initial Diagnosis   Breast cancer, stage 2, left ([place])    07/30/2017 Cancer Staging   Staging form: Breast, AJCC 8th Edition - Clinical stage from 07/30/2017: Stage IIA (cT2, cN1, cM0, G2, ER+, PR+, HER2-) - Signed by Lloyd Huger, MD on 07/30/2017    08/08/2017 - 10/31/2017 Chemotherapy   The patient had DOXOrubicin (ADRIAMYCIN) chemo injection 130 mg, 60 mg/m2 = 130 mg, Intravenous,  Once, 4 of 4 cycles Administration: 130 mg (08/08/2017), 130 mg (08/22/2017), 130 mg (09/05/2017), 130 mg (09/19/2017) palonosetron (ALOXI) injection 0.25 mg, 0.25 mg, Intravenous,  Once, 4 of 4 cycles Administration: 0.25 mg (08/08/2017), 0.25 mg (08/22/2017), 0.25 mg (09/05/2017), 0.25 mg (09/19/2017) pegfilgrastim-cbqv (UDENYCA) injection  6 mg, 6 mg, Subcutaneous, Once, 4 of 4 cycles Administration: 6 mg (08/09/2017), 6 mg (08/23/2017), 6 mg (09/06/2017), 6 mg (09/20/2017) cyclophosphamide (CYTOXAN) 1,300 mg in sodium chloride 0.9 % 250 mL chemo infusion, 600 mg/m2 = 1,300 mg, Intravenous,  Once, 4 of 4 cycles Administration: 1,300 mg (08/08/2017), 1,300 mg (08/22/2017), 1,300 mg (09/05/2017), 1,300 mg (09/19/2017) PACLitaxel (TAXOL) 174 mg in sodium chloride 0.9 % 250 mL chemo infusion (</= 97m/m2), 80 mg/m2 = 174 mg, Intravenous,  Once, 4 of 12 cycles Dose modification: 72 mg/m2 (original dose 80 mg/m2, Cycle 6, Reason: Dose not tolerated) Administration: 174 mg (10/03/2017), 156 mg (10/17/2017), 156 mg (10/24/2017), 156 mg (10/31/2017) fosaprepitant (EMEND) 150 mg, dexamethasone (DECADRON) 12 mg in sodium chloride 0.9 % 145 mL IVPB, , Intravenous,  Once, 4 of 4 cycles Administration:  (08/08/2017),  (08/22/2017),  (09/05/2017),  (09/19/2017)   for chemotherapy treatment.       ALLERGIES:  is allergic to sulfa antibiotics.  MEDICATIONS:  Current Outpatient Medications  Medication Sig Dispense Refill   acetaminophen (TYLENOL) 500 MG tablet Take 1,000 mg by mouth every 6 (six) hours as needed for moderate pain or headache.      ALPRAZolam (XANAX) 0.5 MG tablet Take 1 tablet (0.5 mg total) by mouth 2 (two) times daily. (Patient taking differently: Take 0.5 mg by mouth 2 (two) times daily. Taking once/day) 60 tablet 1   amLODipine (NORVASC) 5 MG tablet Take 1 tablet (5 mg total) by mouth daily. 90 tablet 3   aspirin EC 81 MG tablet Take 81 mg by mouth at bedtime.      calcium carbonate (OS-CAL - DOSED IN MG OF ELEMENTAL CALCIUM) 1250 (500 Ca) MG tablet Take 1 tablet by mouth daily with breakfast.     Cholecalciferol (VITAMIN D3) 125 MCG (5000 UT) CAPS Take 1 capsule by mouth 2 (two) times a day.     docusate sodium (COLACE) 100 MG capsule Take 100 mg by mouth 3 (three) times daily as needed for mild constipation.     hydrochlorothiazide  (HYDRODIURIL) 12.5 MG tablet Take 1 tablet (12.5 mg total) by mouth daily. 90 tablet 3   levothyroxine (SYNTHROID) 100 MCG tablet TAKE 1 TABLET BY MOUTH DAILY 90 tablet 3   nystatin-lidocaine-prednisoLONE-diphenhydrAMINE-distilled water-alum & mag hydroxide-simeth Swish and spit 5-146mfour times a day as needed 480 mL 3   olopatadine (PATANOL) 0.1 % ophthalmic solution Place 1 drop into the right eye 2 (two) times daily. 5 mL 12   palbociclib (IBRANCE) 125 MG tablet Take 1 tablet (125 mg total) by mouth daily. Take for 21 days, then hold for 7 days. Repeat every 28 days. 21 tablet 3   potassium chloride SA (KLOR-CON M) 20 MEQ tablet TAKE 1 TABLET BY MOUTH TWICE DAILY 60 tablet 3   Probiotic Product (ALIGN PO) Take by mouth.     rosuvastatin (CRESTOR) 5 MG tablet TAKE 1 TABLET BY MOUTH DAILY 30 tablet 6   traZODone (DESYREL) 50 MG tablet TAKE 1 TABLET BY MOUTH DAILY 30 tablet 6   triamcinolone cream (KENALOG) 0.1 % Apply 1 application. topically 2 (two) times daily. 30 g 0   No current facility-administered medications for this visit.  VITAL SIGNS: There were no vitals taken for this visit. There were no vitals filed for this visit.   Estimated body mass index is 34.4 kg/m as calculated from the following:   Height as of 05/11/21: 5' 6"  (1.676 m).   Weight as of an earlier encounter on 06/15/21: 96.7 kg (213 lb 1.6 oz).  LABS: CBC:    Component Value Date/Time   WBC 1.6 (L) 06/15/2021 1000   HGB 10.3 (L) 06/15/2021 1000   HGB 14.2 02/11/2011 1349   HCT 30.0 (L) 06/15/2021 1000   HCT 40.9 02/11/2011 1349   PLT 166 06/15/2021 1000   PLT 151 02/11/2011 1349   MCV 107.1 (H) 06/15/2021 1000   MCV 89 02/11/2011 1349   NEUTROABS 0.9 (L) 06/15/2021 1000   LYMPHSABS 0.4 (L) 06/15/2021 1000   MONOABS 0.3 06/15/2021 1000   EOSABS 0.0 06/15/2021 1000   BASOSABS 0.0 06/15/2021 1000   Comprehensive Metabolic Panel:    Component Value Date/Time   NA 134 (L) 06/15/2021 1000   NA 142  02/11/2011 1349   K 3.3 (L) 06/15/2021 1000   K 3.8 02/11/2011 1349   CL 101 06/15/2021 1000   CL 107 02/11/2011 1349   CO2 26 06/15/2021 1000   CO2 24 02/11/2011 1349   BUN 23 06/15/2021 1000   BUN 14 02/11/2011 1349   CREATININE 1.27 (H) 06/15/2021 1000   CREATININE 0.65 02/11/2011 1349   GLUCOSE 116 (H) 06/15/2021 1000   GLUCOSE 98 02/11/2011 1349   CALCIUM 8.9 06/15/2021 1000   CALCIUM 9.0 02/11/2011 1349   AST 22 06/15/2021 1000   AST 27 02/11/2011 1349   ALT 14 06/15/2021 1000   ALT 36 02/11/2011 1349   ALKPHOS 64 06/15/2021 1000   ALKPHOS 122 02/11/2011 1349   BILITOT 0.8 06/15/2021 1000   BILITOT 0.5 02/11/2011 1349   PROT 7.6 06/15/2021 1000   PROT 7.0 02/11/2011 1349   ALBUMIN 4.0 06/15/2021 1000   ALBUMIN 4.1 02/11/2011 1349    RADIOGRAPHIC STUDIES: No results found.   Assessment and Plan-  Reviewed CBC and CMP, patient is okay to resume treatment, but may hold on this because she has might be having an upcoming dental procedure. Left a message with Dr. Lanell Persons (dentisit) office to find out when I exactly when she needs ANC counts to be increased by  See detailed fax from Dr. Lanell Persons (dentisit). Patient needs to have 3 teeth extracted Lab from today faxed to Dr. Neysa Bonito office Continue to hold [place], not yet cleared by dentist to resume treatment.  Hypokalemia: Patient reported only taking 37mq only daily, instructed her to increase to 432m daily for 14 days, then go back to 2070m Oral Chemotherapy Adherence: No reported missed doses  New medications:  Patient was recently prescribed clindamycin by her dentist for a short course, no DDI with palbociclib  Medication Access Issues: No issues, fills her [facility] HopePatient expressed understanding and was in agreement with this plan. She also understands that She can call clinic at any time with any questions, concerns, or complaints.   Follow-up plan: RTC in 4 weeks  Thank you  for allowing me to participate in the care of this very pleasant patient.   Time Total: 15 mins  Visit consisted of counseling and education on dealing with issues of symptom management in the setting of serious and potentially life-threatening illness.Greater than 50%  of this time was spent counseling and coordinating care related to the  above assessment and plan.  Signed by: Darl Pikes, PharmD, BCPS, Salley Slaughter, CPP Hematology/Oncology Clinical Pharmacist Practitioner [facility]/HP/AP [facility] [phone]  06/15/2021 3:26 PM

## 2021-06-16 ENCOUNTER — Telehealth: Payer: Self-pay | Admitting: Pharmacist

## 2021-06-16 LAB — CANCER ANTIGEN 27.29: CA 27.29: 82 U/mL — ABNORMAL HIGH (ref 0.0–38.6)

## 2021-06-16 NOTE — Telephone Encounter (Signed)
Oral Chemotherapy Pharmacist Encounter   Received returned call from patient's dentist Dr. Tula Nakayama. Ms. Bloch has a few upcoming invasive dental procedures for which she will need an [place] wnl.   Spoke with Dr. Dina Rich and the first invasive procedure she has planned would be on 6/28 and 6/29. The plan would be to have Ms. Siemen begin her palbociclib as planned today 06/16/21. She will return for a mid cycle lab check on 06/30/21. Depending on her labs are that we may hold treatment until her procedure. She will repeat her labs on 07/07/21 to looks for count improvement prior to the procedure.   Labs will be faxed to Dr. Eliezer Bottom office following checking.  Patient is aware of the above plan and will begin her palbociclib today.    Darl Pikes, PharmD, BCPS, BCOP, CPP Hematology/Oncology Clinical Pharmacist [place]/DB/AP Oral [facility] [phone]  06/16/2021 3:08 PM

## 2021-06-21 NOTE — Telephone Encounter (Signed)
Received email communication from [facility], DMD on 06/20/21. She wanted to let us know that during her 06/20/21 dental examination with Ms. Schou, she noted "some necrotic bone at the extraction site on the lower right". She is now referring patient to an oral and Maxillofacial surgeon (Dr. Hampton Abbot) for treatment. She asked that we continue to hold the Xgeva.  Emailed Dr. Wilkie Aye back to let her know we plan on continuing to hold Xgeva.   The plan still stands for the invasive treatment in 6/26 and 6/28, as detailed in previous note

## 2021-06-23 ENCOUNTER — Other Ambulatory Visit: Payer: Self-pay | Admitting: *Deleted

## 2021-06-23 NOTE — Patient Outreach (Signed)
[place] [facility]) Care Management  06/23/2021  [place] 02/03/1950 [id number]  Initial outreach for new ACP REACH referral. No answer, left a message and requested a return call.  [name]. [name], MSN, [facility] Gerontological Nurse Practitioner [facility] Care Management [phone]

## 2021-06-27 ENCOUNTER — Other Ambulatory Visit: Payer: Self-pay | Admitting: *Deleted

## 2021-06-27 NOTE — Patient Outreach (Signed)
[place] [facility]) Care Management  06/27/2021  Alicia [name] [facility] 10-15-1950 [id number]  Incoming call from Alicia [name] responding to NP's previous call on 06/23/21.  Alicia [name] had questions regarding NP documentation which were answered. Provided information on [facility] Care Management services she is eligible for at no additional charge. She advised NP that she is well cared for by her providers and does not feel like the service would be needed at this time. NP will send letter to Alicia [name] so she may utilize our services in the future.  [name]. [name], MSN, [facility] Gerontological Nurse Practitioner [facility] Care Management [phone]

## 2021-06-30 ENCOUNTER — Other Ambulatory Visit: Payer: Self-pay

## 2021-06-30 ENCOUNTER — Telehealth: Payer: Self-pay | Admitting: Pharmacist

## 2021-06-30 ENCOUNTER — Inpatient Hospital Stay: Payer: Medicare Other

## 2021-06-30 DIAGNOSIS — C50912 Malignant neoplasm of unspecified site of left female breast: Secondary | ICD-10-CM

## 2021-06-30 DIAGNOSIS — Z79899 Other long term (current) drug therapy: Secondary | ICD-10-CM | POA: Diagnosis not present

## 2021-06-30 DIAGNOSIS — Z5111 Encounter for antineoplastic chemotherapy: Secondary | ICD-10-CM | POA: Diagnosis not present

## 2021-06-30 DIAGNOSIS — C7951 Secondary malignant neoplasm of bone: Secondary | ICD-10-CM | POA: Diagnosis not present

## 2021-06-30 LAB — COMPREHENSIVE METABOLIC PANEL
ALT: 19 U/L (ref 0–44)
AST: 26 U/L (ref 15–41)
Albumin: 4 g/dL (ref 3.5–5.0)
Alkaline Phosphatase: 73 U/L (ref 38–126)
Anion gap: 9 (ref 5–15)
BUN: 29 mg/dL — ABNORMAL HIGH (ref 8–23)
CO2: 26 mmol/L (ref 22–32)
Calcium: 9.3 mg/dL (ref 8.9–10.3)
Chloride: 101 mmol/L (ref 98–111)
Creatinine, Ser: 1.57 mg/dL — ABNORMAL HIGH (ref 0.44–1.00)
GFR, Estimated: 35 mL/min — ABNORMAL LOW (ref >60)
Glucose, Bld: 104 mg/dL — ABNORMAL HIGH (ref 70–99)
Potassium: 4 mmol/L (ref 3.5–5.1)
Sodium: 136 mmol/L (ref 135–145)
Total Bilirubin: 1 mg/dL (ref 0.3–1.2)
Total Protein: 7.4 g/dL (ref 6.5–8.1)

## 2021-06-30 LAB — CBC WITH DIFFERENTIAL/PLATELET
Abs Immature Granulocytes: 0.01 10*3/uL (ref 0.00–0.07)
Basophils Absolute: 0 10*3/uL (ref 0.0–0.1)
Basophils Relative: 2 %
Eosinophils Absolute: 0 10*3/uL (ref 0.0–0.5)
Eosinophils Relative: 2 %
HCT: 30.9 % — ABNORMAL LOW (ref 36.0–46.0)
Hemoglobin: 10.5 g/dL — ABNORMAL LOW (ref 12.0–15.0)
Immature Granulocytes: 0 %
Lymphocytes Relative: 15 %
Lymphs Abs: 0.4 10*3/uL — ABNORMAL LOW (ref 0.7–4.0)
MCH: 36.1 pg — ABNORMAL HIGH (ref 26.0–34.0)
MCHC: 34 g/dL (ref 30.0–36.0)
MCV: 106.2 fL — ABNORMAL HIGH (ref 80.0–100.0)
Monocytes Absolute: 0.2 10*3/uL (ref 0.1–1.0)
Monocytes Relative: 9 %
Neutro Abs: 1.9 10*3/uL (ref 1.7–7.7)
Neutrophils Relative %: 72 %
Platelets: 246 10*3/uL (ref 150–400)
RBC: 2.91 MIL/uL — ABNORMAL LOW (ref 3.87–5.11)
RDW: 14.7 % (ref 11.5–15.5)
Smear Review: NORMAL
WBC: 2.6 10*3/uL — ABNORMAL LOW (ref 4.0–10.5)
nRBC: 0 % (ref 0.0–0.2)

## 2021-06-30 NOTE — Telephone Encounter (Signed)
Oral Chemotherapy Pharmacy Student Encounter   Called and told patient to hold Ibrance starting now in preparation for upcoming dental procedure. Re-start of therapy will be discussed at follow-up visit with oncology following procedures. Patient expressed understanding. Labs from today were faxed by Nuala Alpha to pt's dental office.  Garrel Ridgel, PharmD Candidate [place] [facility] [place]/DB/AP Oral [facility] [phone]  06/30/2021 3:54 PM

## 2021-07-01 LAB — CANCER ANTIGEN 27.29: CA 27.29: 84.4 U/mL — ABNORMAL HIGH (ref 0.0–38.6)

## 2021-07-04 ENCOUNTER — Other Ambulatory Visit (HOSPITAL_COMMUNITY): Payer: Self-pay

## 2021-07-06 ENCOUNTER — Other Ambulatory Visit (HOSPITAL_COMMUNITY): Payer: Self-pay

## 2021-07-07 ENCOUNTER — Other Ambulatory Visit: Payer: Medicare Other

## 2021-07-07 ENCOUNTER — Inpatient Hospital Stay: Payer: Medicare Other

## 2021-07-07 DIAGNOSIS — C50912 Malignant neoplasm of unspecified site of left female breast: Secondary | ICD-10-CM

## 2021-07-07 DIAGNOSIS — Z79899 Other long term (current) drug therapy: Secondary | ICD-10-CM | POA: Diagnosis not present

## 2021-07-07 DIAGNOSIS — C7951 Secondary malignant neoplasm of bone: Secondary | ICD-10-CM | POA: Diagnosis not present

## 2021-07-07 DIAGNOSIS — Z5111 Encounter for antineoplastic chemotherapy: Secondary | ICD-10-CM | POA: Diagnosis not present

## 2021-07-07 LAB — CBC WITH DIFFERENTIAL/PLATELET
Abs Immature Granulocytes: 0.01 10*3/uL (ref 0.00–0.07)
Basophils Absolute: 0 10*3/uL (ref 0.0–0.1)
Basophils Relative: 1 %
Eosinophils Absolute: 0 10*3/uL (ref 0.0–0.5)
Eosinophils Relative: 0 %
HCT: 28.9 % — ABNORMAL LOW (ref 36.0–46.0)
Hemoglobin: 9.9 g/dL — ABNORMAL LOW (ref 12.0–15.0)
Immature Granulocytes: 0 %
Lymphocytes Relative: 16 %
Lymphs Abs: 0.4 10*3/uL — ABNORMAL LOW (ref 0.7–4.0)
MCH: 36.7 pg — ABNORMAL HIGH (ref 26.0–34.0)
MCHC: 34.3 g/dL (ref 30.0–36.0)
MCV: 107 fL — ABNORMAL HIGH (ref 80.0–100.0)
Monocytes Absolute: 0.5 10*3/uL (ref 0.1–1.0)
Monocytes Relative: 20 %
Neutro Abs: 1.4 10*3/uL — ABNORMAL LOW (ref 1.7–7.7)
Neutrophils Relative %: 63 %
Platelets: 127 10*3/uL — ABNORMAL LOW (ref 150–400)
RBC: 2.7 MIL/uL — ABNORMAL LOW (ref 3.87–5.11)
RDW: 15.3 % (ref 11.5–15.5)
WBC: 2.3 10*3/uL — ABNORMAL LOW (ref 4.0–10.5)
nRBC: 0 % (ref 0.0–0.2)

## 2021-07-07 LAB — COMPREHENSIVE METABOLIC PANEL
ALT: 18 U/L (ref 0–44)
AST: 21 U/L (ref 15–41)
Albumin: 3.9 g/dL (ref 3.5–5.0)
Alkaline Phosphatase: 75 U/L (ref 38–126)
Anion gap: 8 (ref 5–15)
BUN: 21 mg/dL (ref 8–23)
CO2: 28 mmol/L (ref 22–32)
Calcium: 9.3 mg/dL (ref 8.9–10.3)
Chloride: 102 mmol/L (ref 98–111)
Creatinine, Ser: 1.28 mg/dL — ABNORMAL HIGH (ref 0.44–1.00)
GFR, Estimated: 45 mL/min — ABNORMAL LOW (ref >60)
Glucose, Bld: 98 mg/dL (ref 70–99)
Potassium: 3.5 mmol/L (ref 3.5–5.1)
Sodium: 138 mmol/L (ref 135–145)
Total Bilirubin: 0.6 mg/dL (ref 0.3–1.2)
Total Protein: 7.2 g/dL (ref 6.5–8.1)

## 2021-07-07 NOTE — Progress Notes (Signed)
Alicia [name]  Telephone:(336) [phone] Fax:(336) [phone]  ID: KAMORI KITCHENS OB: January 07, 1951  MR#: [id number]  FHL#:[id number]  Patient Care Team: [name], [name], Harrell Gave, [name] Rico Junker, RN as Registered Nurse Lloyd Huger, MD as Consulting Physician (Oncology) Bary Castilla Forest Gleason, MD as Consulting Physician (General Surgery) Bary Castilla Forest Gleason, MD as Consulting Physician (General Surgery) Noreene Filbert, MD as Referring Physician (Radiation Oncology) Jeral Fruit, RN as Registered Nurse   CHIEF COMPLAINT: Recurrent stage IV ER/PR positive, 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  INTERVAL HISTORY: Patient returns to clinic today for further evaluation and continuation of Ibrance and Faslodex.  She only took [place] for 14 days this past cycle secondary to a dental procedure which is scheduled today.  Patient complains of increasing left flank pain, but otherwise feels well.  She does not complain of weakness or fatigue today.  She continues to have a mild peripheral neuropathy, but no other neurologic complaints.  She denies any chest pain, shortness of breath, cough, or hemoptysis.  She denies any nausea, vomiting, constipation, or diarrhea.  She has no urinary complaints.  Patient offers no further specific complaints today.    REVIEW OF SYSTEMS:   Review of Systems  Constitutional: Negative.  Negative for fever, malaise/fatigue and weight loss.  HENT:  Negative for congestion.   Respiratory: Negative.  Negative for cough and shortness of breath.   Cardiovascular: Negative.  Negative for chest pain and leg swelling.  Gastrointestinal: Negative.  Negative for abdominal pain, constipation, diarrhea and nausea.  Genitourinary:  Positive for flank pain. Negative for dysuria and urgency.  Musculoskeletal:  Negative for back pain and joint pain.  Skin:  Negative.  Negative for rash.  Neurological:  Positive for tingling and sensory change. Negative for dizziness, focal weakness, weakness and headaches.  Psychiatric/Behavioral:  The patient is not nervous/anxious.     As per HPI. Otherwise, a complete review of systems is negative.  PAST MEDICAL HISTORY: Past Medical History:  Diagnosis Date   Anxiety    Breast cancer, left ([name]) 07/2017   Depression    Hypertension    Hypothyroidism    Personal history of chemotherapy 2019   LEFT mastectomy-chemo before   Personal history of radiation therapy 03/2018   LEFT mastectomy   Rapid heart rate    Thyroid disease     PAST SURGICAL HISTORY: Past Surgical History:  Procedure Laterality Date   AXILLARY LYMPH NODE BIOPSY Left 07/16/2017   METASTATIC MAMMARY CARCINOMA   BREAST BIOPSY Left 07/16/2017   [place] bx of left breast mass and left breast LN.  INVASIVE MAMMARY CARCINOMA, NO SPECIAL TYPE.    BREAST EXCISIONAL BIOPSY Right 2001   benign   BREAST LUMPECTOMY WITH SENTINEL LYMPH NODE BIOPSY Left 12/06/2017   Procedure: BREAST LUMPECTOMY WITH SENTINEL LYMPH NODE BX;  Surgeon: Robert Bellow, MD;  Location: [facility] ORS;  Service: General;  Laterality: Left;   COLONOSCOPY     MASTECTOMY Left 12/23/2017   PORTACATH PLACEMENT Right 08/07/2017   Procedure: INSERTION PORT-A-CATH;  Surgeon: Robert Bellow, MD;  Location: [facility] OR[facility];  Service: General;  Laterality: Right;   SIMPLE MASTECTOMY WITH AXILLARY SENTINEL NODE BIOPSY Left 12/23/2017   T2,N2 with 6/7 nodes positive. Whole breast radiation.  Surgeon: Robert Bellow, MD;  Location: [facility] ORS;  Service: General;  Laterality: Left;   TONSILLECTOMY      FAMILY HISTORY: Family History  Problem Relation Age of Onset   Stroke Mother    Thyroid disease Mother    Renal Disease Mother    Stroke Father    Heart attack Father    Sudden death Father 63       suicide   Anuerysm Brother    Breast cancer Neg Hx     ADVANCED DIRECTIVES  (Y/N):  N  HEALTH MAINTENANCE: Social History   Tobacco Use   Smoking status: Former    Packs/Alicia: 1.00    Years: 30.00    Total pack years: 30.00    Types: Cigarettes    Quit date: 2019    Years since quitting: 4.4   Smokeless tobacco: Never  Vaping Use   Vaping Use: Never used  Substance Use Topics   Alcohol use: Not Currently   Drug use: Never     Colonoscopy:  PAP:  Bone density:  Lipid panel:  Allergies  Allergen Reactions   Sulfa Antibiotics Diarrhea    Current Outpatient Medications  Medication Sig Dispense Refill   acetaminophen (TYLENOL) 500 MG tablet Take 1,000 mg by mouth every 6 (six) hours as needed for moderate pain or headache.      ALPRAZolam (XANAX) 0.5 MG tablet Take 1 tablet (0.5 mg total) by mouth 2 (two) times daily. (Patient taking differently: Take 0.5 mg by mouth 2 (two) times daily. Taking once/Alicia) 60 tablet 1   amLODipine (NORVASC) 5 MG tablet Take 1 tablet (5 mg total) by mouth daily. 90 tablet 3   aspirin EC 81 MG tablet Take 81 mg by mouth at bedtime.      calcium carbonate (OS-CAL - DOSED IN MG OF ELEMENTAL CALCIUM) 1250 (500 Ca) MG tablet Take 1 tablet by mouth daily with breakfast.     chlorhexidine (PERIDEX) 0.12 % solution SMARTSIG:By Mouth     Cholecalciferol (VITAMIN D3) 125 MCG (5000 UT) CAPS Take 1 capsule by mouth 2 (two) times a Alicia.     docusate sodium (COLACE) 100 MG capsule Take 100 mg by mouth 3 (three) times daily as needed for mild constipation.     Erythromycin 500 MG TBEC Take 1 tablet by mouth 3 (three) times daily.     hydrochlorothiazide (HYDRODIURIL) 12.5 MG tablet Take 1 tablet (12.5 mg total) by mouth daily. 90 tablet 3   levothyroxine (SYNTHROID) 100 MCG tablet TAKE 1 TABLET BY MOUTH DAILY 90 tablet 3   nystatin-lidocaine-prednisoLONE-diphenhydrAMINE-distilled water-alum & mag hydroxide-simeth Swish and spit 5-95m four times a Alicia as needed 480 mL 3   olopatadine (PATANOL) 0.1 % ophthalmic solution Place 1 drop into  the right eye 2 (two) times daily. 5 mL 12   potassium chloride SA (KLOR-CON M) 20 MEQ tablet TAKE 1 TABLET BY MOUTH TWICE DAILY 60 tablet 3   PREVIDENT 5000 PLUS 1.1 % CREA dental cream SMARTSIG:Application By Mouth     Probiotic Product (ALIGN PO) Take by mouth.     rosuvastatin (CRESTOR) 5 MG tablet TAKE 1 TABLET BY MOUTH DAILY 30 tablet 6   traZODone (DESYREL) 50 MG tablet TAKE 1 TABLET BY MOUTH DAILY 30 tablet 6   triamcinolone cream (KENALOG) 0.1 % Apply 1 application. topically 2 (two) times daily. 30 g 0   palbociclib (IBRANCE) 125 MG tablet Take 1 tablet (125 mg total) by mouth daily. Take for 21 days, then hold for 7 days. Repeat every 28 days. (Patient not taking: Reported on 07/13/2021) 21 tablet 3   No current facility-administered medications for this visit.  OBJECTIVE: Vitals:   07/13/21 0916  BP: (!) 110/56  Pulse: 79  Resp: 16  Temp: (!) 96.5 F (35.8 C)  SpO2: 100%     Body mass index is 34.51 kg/m.    ECOG FS:0 - Asymptomatic  General: Well-developed, well-nourished, no acute distress. Eyes: Pink conjunctiva, anicteric sclera. HEENT: Normocephalic, moist mucous membranes. Lungs: No audible wheezing or coughing. Heart: Regular rate and rhythm. Abdomen: Soft, nontender, no obvious distention. Musculoskeletal: No edema, cyanosis, or clubbing. Neuro: Alert, answering all questions appropriately. Cranial nerves grossly intact. Skin: No rashes or petechiae noted. Psych: Normal affect.   LAB RESULTS:  Lab Results  Component Value Date   NA 135 07/13/2021   K 3.9 07/13/2021   CL 99 07/13/2021   CO2 27 07/13/2021   GLUCOSE 102 (H) 07/13/2021   BUN 22 07/13/2021   CREATININE 1.46 (H) 07/13/2021   CALCIUM 9.7 07/13/2021   PROT 7.5 07/13/2021   ALBUMIN 4.2 07/13/2021   AST 35 07/13/2021   ALT 26 07/13/2021   ALKPHOS 84 07/13/2021   BILITOT 0.9 07/13/2021   GFRNONAA 38 (L) 07/13/2021   GFRAA 57 (L) 10/01/2019    Lab Results  Component Value Date    WBC 3.5 (L) 07/13/2021   NEUTROABS 2.2 07/13/2021   HGB 10.7 (L) 07/13/2021   HCT 31.9 (L) 07/13/2021   MCV 106.0 (H) 07/13/2021   PLT 174 07/13/2021     STUDIES: No results found.   ONCOLOGY HISTORY: Patient initially received neoadjuvant chemotherapy with Adriamycin and Cytoxan followed by weekly Taxol. She only received 4 cycles of weekly Taxol prior to discontinuation of treatment on October 31, 2017 secondary to persistent peripheral neuropathy.  She ultimately required mastectomy and final pathology noted 5 of 6 lymph nodes positive for disease.  Patient completed adjuvant XRT in mid April 2020.  Pain initiated letrozole in May 2020, this was discontinued secondary to side effects and patient was started on anastrozole.  Nuclear medicine bone scan on June 19, 2019 revealed metastatic disease and anastrozole was subsequently discontinued.  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  PLAN:    1. 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  MRI of the brain on August 24, 2019 reviewed independently with no obvious evidence of metastatic disease.  Repeat PET scan on March 02, 2021 revealed a hypermetabolic area in her left lateral iliac wing, but no other possible evidence of disease.  Hypermetabolism in her jaw is likely secondary to her ongoing dental issues.  Patient's CA 27-29 initially increased to 141.9, but appears to have plateaued since November 2021 between 46.9 and 87.6.  Her most recent result is 85.7, today's result is pending.  Patient only took 14 days of Ibrance this past cycle, with improvement of her white blood cell count and neutropenia.  Continue intermittent treatment secondary to her ongoing dental work.  Proceed with Faslodex today.   Continue to hold Xgeva at this time given her issues with her mandible.  Return to clinic in 4 weeks for further evaluation and continuation of treatment. 2.  Peripheral neuropathy: Chronic and unchanged. 3.  History of pulmonary embolus: Patient was diagnosed with a small pulmonary embolus on January 10, 2018.  She is no longer on anticoagulation.  There was no evidence of recurrent PE on CT scan from October 27, 2019. 4.  Osteopenia: Patient's most recent bone mineral density on March 03, 2019 reported a T score of -1.8 which is only mildly decreased from 1 year prior when the reported T score was -1.6.  Continue calcium and vitamin D supplementation.  Continue to hold Xgeva as above. 5.  Left hip/flank pain:  Patient has had XRT to this area.  She was given a referral back to radiation oncology for consideration of retreatment. 6.  Leukopenia: Improved.  This has ranged between 1.6 and 3.2 since July 2021.   7.  Anemia: Hemoglobin mildly improved to 10.7. 8.  Renal insufficiency: Chronic and unchanged.  Patient's creatinine is 1.46 today 9.  Hypokalemia: Resolved.  Continue oral supplementation as prescribed.   10.  UTI: Resolved. 11.  Dental pathology: Continue follow-up as directed.   12.  Thrombocytopenia: Resolved.  Patient expressed understanding and was in agreement with this plan. She also understands that She can call clinic at any time with any questions, concerns, or complaints.    Cancer Staging  Breast cancer, stage 2, left ([place]) Staging form: Breast, AJCC 8th Edition - Clinical stage from 07/30/2017: Stage IIA (cT2, cN1, cM0, G2, ER+, PR+, HER2-) - Signed by Lloyd Huger, MD on 07/30/2017 Histologic grading system: 3 grade system Laterality: Left   Lloyd Huger, MD   07/14/2021 7:24 AM

## 2021-07-08 LAB — CANCER ANTIGEN 27.29: CA 27.29: 85.7 U/mL — ABNORMAL HIGH (ref 0.0–38.6)

## 2021-07-10 ENCOUNTER — Other Ambulatory Visit (HOSPITAL_COMMUNITY): Payer: Self-pay

## 2021-07-13 ENCOUNTER — Inpatient Hospital Stay: Payer: Medicare Other

## 2021-07-13 ENCOUNTER — Encounter: Payer: Self-pay | Admitting: Oncology

## 2021-07-13 ENCOUNTER — Inpatient Hospital Stay: Payer: Medicare Other | Admitting: Pharmacist

## 2021-07-13 ENCOUNTER — Inpatient Hospital Stay (HOSPITAL_BASED_OUTPATIENT_CLINIC_OR_DEPARTMENT_OTHER): Payer: Medicare Other | Admitting: Oncology

## 2021-07-13 VITALS — BP 110/56 | HR 79 | Temp 96.5°F | Resp 16 | Ht 66.0 in | Wt 213.8 lb

## 2021-07-13 DIAGNOSIS — C50912 Malignant neoplasm of unspecified site of left female breast: Secondary | ICD-10-CM | POA: Diagnosis not present

## 2021-07-13 DIAGNOSIS — Z5111 Encounter for antineoplastic chemotherapy: Secondary | ICD-10-CM | POA: Diagnosis not present

## 2021-07-13 DIAGNOSIS — Z79899 Other long term (current) drug therapy: Secondary | ICD-10-CM | POA: Diagnosis not present

## 2021-07-13 DIAGNOSIS — C7951 Secondary malignant neoplasm of bone: Secondary | ICD-10-CM | POA: Diagnosis not present

## 2021-07-13 LAB — COMPREHENSIVE METABOLIC PANEL
ALT: 26 U/L (ref 0–44)
AST: 35 U/L (ref 15–41)
Albumin: 4.2 g/dL (ref 3.5–5.0)
Alkaline Phosphatase: 84 U/L (ref 38–126)
Anion gap: 9 (ref 5–15)
BUN: 22 mg/dL (ref 8–23)
CO2: 27 mmol/L (ref 22–32)
Calcium: 9.7 mg/dL (ref 8.9–10.3)
Chloride: 99 mmol/L (ref 98–111)
Creatinine, Ser: 1.46 mg/dL — ABNORMAL HIGH (ref 0.44–1.00)
GFR, Estimated: 38 mL/min — ABNORMAL LOW (ref >60)
Glucose, Bld: 102 mg/dL — ABNORMAL HIGH (ref 70–99)
Potassium: 3.9 mmol/L (ref 3.5–5.1)
Sodium: 135 mmol/L (ref 135–145)
Total Bilirubin: 0.9 mg/dL (ref 0.3–1.2)
Total Protein: 7.5 g/dL (ref 6.5–8.1)

## 2021-07-13 LAB — CBC WITH DIFFERENTIAL/PLATELET
Abs Immature Granulocytes: 0.03 10*3/uL (ref 0.00–0.07)
Basophils Absolute: 0.1 10*3/uL (ref 0.0–0.1)
Basophils Relative: 2 %
Eosinophils Absolute: 0 10*3/uL (ref 0.0–0.5)
Eosinophils Relative: 1 %
HCT: 31.9 % — ABNORMAL LOW (ref 36.0–46.0)
Hemoglobin: 10.7 g/dL — ABNORMAL LOW (ref 12.0–15.0)
Immature Granulocytes: 1 %
Lymphocytes Relative: 12 %
Lymphs Abs: 0.4 10*3/uL — ABNORMAL LOW (ref 0.7–4.0)
MCH: 35.5 pg — ABNORMAL HIGH (ref 26.0–34.0)
MCHC: 33.5 g/dL (ref 30.0–36.0)
MCV: 106 fL — ABNORMAL HIGH (ref 80.0–100.0)
Monocytes Absolute: 0.7 10*3/uL (ref 0.1–1.0)
Monocytes Relative: 21 %
Neutro Abs: 2.2 10*3/uL (ref 1.7–7.7)
Neutrophils Relative %: 63 %
Platelets: 174 10*3/uL (ref 150–400)
RBC: 3.01 MIL/uL — ABNORMAL LOW (ref 3.87–5.11)
RDW: 15 % (ref 11.5–15.5)
WBC: 3.5 10*3/uL — ABNORMAL LOW (ref 4.0–10.5)
nRBC: 0 % (ref 0.0–0.2)

## 2021-07-13 MED ORDER — FULVESTRANT 250 MG/5ML IM SOSY
500.0000 mg | PREFILLED_SYRINGE | Freq: Once | INTRAMUSCULAR | Status: AC
  Administered 2021-07-13: 500 mg via INTRAMUSCULAR
  Filled 2021-07-13: qty 10

## 2021-07-13 NOTE — Progress Notes (Signed)
[place]  Telephone:(336) [phone] Fax:(336) [phone]  Patient Care Team: [name], [name], Harrell Gave, [name] Rico Junker, RN as Registered Nurse Grayland Ormond Kathlene November, MD as Consulting Physician (Oncology) Bary Castilla Forest Gleason, MD as Consulting Physician (General Surgery) Bary Castilla Forest Gleason, MD as Consulting Physician (General Surgery) Noreene Filbert, MD as Referring Physician (Radiation Oncology) Jeral Fruit, RN as Registered Nurse   Name of the patient: Alicia [name]  [id number]  Jan 22, 1950   Date of visit: 07/13/21  HPI: Patient is a 71 y.o. female with recurrent stage IV ER/PR positive, HER2 negative breast cancer. Patient started on Ibrance on 07/09/19.   Reason for Consult: Oral chemotherapy follow-up for Ibrance (palbociclib) therapy.   PAST MEDICAL HISTORY: Past Medical History:  Diagnosis Date   Anxiety    Breast cancer, left ([place]) 07/2017   Depression    Hypertension    Hypothyroidism    Personal history of chemotherapy 2019   LEFT mastectomy-chemo before   Personal history of radiation therapy 03/2018   LEFT mastectomy   Rapid heart rate    Thyroid disease     PAST SURGICAL HISTORY:  Past Surgical History:  Procedure Laterality Date   AXILLARY LYMPH NODE BIOPSY Left 07/16/2017   METASTATIC MAMMARY CARCINOMA   BREAST BIOPSY Left 07/16/2017   [place] bx of left breast mass and left breast LN.  INVASIVE MAMMARY CARCINOMA, NO SPECIAL TYPE.    BREAST EXCISIONAL BIOPSY Right 2001   benign   BREAST LUMPECTOMY WITH SENTINEL LYMPH NODE BIOPSY Left 12/06/2017   Procedure: BREAST LUMPECTOMY WITH SENTINEL LYMPH NODE BX;  Surgeon: Robert Bellow, MD;  Location: [facility] ORS;  Service: General;  Laterality: Left;   COLONOSCOPY     MASTECTOMY Left 12/23/2017   PORTACATH PLACEMENT Right 08/07/2017   Procedure: INSERTION PORT-A-CATH;  Surgeon:  Robert Bellow, MD;  Location: [facility] OR[facility];  Service: General;  Laterality: Right;   SIMPLE MASTECTOMY WITH AXILLARY SENTINEL NODE BIOPSY Left 12/23/2017   T2,N2 with 6/7 nodes positive. Whole breast radiation.  Surgeon: Robert Bellow, MD;  Location: [facility] ORS;  Service: General;  Laterality: Left;   TONSILLECTOMY      HEMATOLOGY/ONCOLOGY HISTORY:  Oncology History Overview Note  Patient is a 71 year old female who recently self palpated a mass on her left breast.  Subsequent imaging and biopsy revealed the above-stated breast cancer.  Case was also discussed extensively at case conference.  Given the size and the stage of patient's malignancy, she will benefit from neoadjuvant chemotherapy using Adriamycin, Cytoxan, and Taxol.  Patient will also require Neulasta support.  Will get CT scan of the chest, abdomen, and pelvis to assess for any metastatic disease.  Patient will also require port placement and MUGA prior to initiating treatment  CT abdomen/pelvis/chest did not reveal any suspicious lesions concerning for metastatic disease. (08/01/17)  Port-A-Cath placed on 08/07/2017.  Cycle 1 day 1 of AC was given on 08/08/17.     Breast cancer, stage 2, left ([place])  07/24/2017 Initial Diagnosis   Breast cancer, stage 2, left ([place])   07/30/2017 Cancer Staging   Staging form: Breast, AJCC 8th Edition - Clinical stage from 07/30/2017: Stage IIA (cT2, cN1, cM0, G2, ER+, PR+, HER2-) - Signed by Lloyd Huger, MD on 07/30/2017   08/08/2017 - 10/31/2017 Chemotherapy   The patient had DOXOrubicin (ADRIAMYCIN) chemo injection 130 mg, 60 mg/m2 = 130 mg, Intravenous,  Once, 4  of 4 cycles Administration: 130 mg (08/08/2017), 130 mg (08/22/2017), 130 mg (09/05/2017), 130 mg (09/19/2017) palonosetron (ALOXI) injection 0.25 mg, 0.25 mg, Intravenous,  Once, 4 of 4 cycles Administration: 0.25 mg (08/08/2017), 0.25 mg (08/22/2017), 0.25 mg (09/05/2017), 0.25 mg (09/19/2017) pegfilgrastim-cbqv (UDENYCA) injection 6  mg, 6 mg, Subcutaneous, Once, 4 of 4 cycles Administration: 6 mg (08/09/2017), 6 mg (08/23/2017), 6 mg (09/06/2017), 6 mg (09/20/2017) cyclophosphamide (CYTOXAN) 1,300 mg in sodium chloride 0.9 % 250 mL chemo infusion, 600 mg/m2 = 1,300 mg, Intravenous,  Once, 4 of 4 cycles Administration: 1,300 mg (08/08/2017), 1,300 mg (08/22/2017), 1,300 mg (09/05/2017), 1,300 mg (09/19/2017) PACLitaxel (TAXOL) 174 mg in sodium chloride 0.9 % 250 mL chemo infusion (</= 68m/m2), 80 mg/m2 = 174 mg, Intravenous,  Once, 4 of 12 cycles Dose modification: 72 mg/m2 (original dose 80 mg/m2, Cycle 6, Reason: Dose not tolerated) Administration: 174 mg (10/03/2017), 156 mg (10/17/2017), 156 mg (10/24/2017), 156 mg (10/31/2017) fosaprepitant (EMEND) 150 mg, dexamethasone (DECADRON) 12 mg in sodium chloride 0.9 % 145 mL IVPB, , Intravenous,  Once, 4 of 4 cycles Administration:  (08/08/2017),  (08/22/2017),  (09/05/2017),  (09/19/2017)  for chemotherapy treatment.      ALLERGIES:  is allergic to sulfa antibiotics.  MEDICATIONS:  Current Outpatient Medications  Medication Sig Dispense Refill   acetaminophen (TYLENOL) 500 MG tablet Take 1,000 mg by mouth every 6 (six) hours as needed for moderate pain or headache.      ALPRAZolam (XANAX) 0.5 MG tablet Take 1 tablet (0.5 mg total) by mouth 2 (two) times daily. (Patient taking differently: Take 0.5 mg by mouth 2 (two) times daily. Taking once/day) 60 tablet 1   amLODipine (NORVASC) 5 MG tablet Take 1 tablet (5 mg total) by mouth daily. 90 tablet 3   aspirin EC 81 MG tablet Take 81 mg by mouth at bedtime.      calcium carbonate (OS-CAL - DOSED IN MG OF ELEMENTAL CALCIUM) 1250 (500 Ca) MG tablet Take 1 tablet by mouth daily with breakfast.     chlorhexidine (PERIDEX) 0.12 % solution SMARTSIG:By Mouth     Cholecalciferol (VITAMIN D3) 125 MCG (5000 UT) CAPS Take 1 capsule by mouth 2 (two) times a day.     docusate sodium (COLACE) 100 MG capsule Take 100 mg by mouth 3 (three) times daily as  needed for mild constipation.     Erythromycin 500 MG TBEC Take 1 tablet by mouth 3 (three) times daily.     hydrochlorothiazide (HYDRODIURIL) 12.5 MG tablet Take 1 tablet (12.5 mg total) by mouth daily. 90 tablet 3   levothyroxine (SYNTHROID) 100 MCG tablet TAKE 1 TABLET BY MOUTH DAILY 90 tablet 3   nystatin-lidocaine-prednisoLONE-diphenhydrAMINE-distilled water-alum & mag hydroxide-simeth Swish and spit 5-148mfour times a day as needed 480 mL 3   olopatadine (PATANOL) 0.1 % ophthalmic solution Place 1 drop into the right eye 2 (two) times daily. 5 mL 12   palbociclib (IBRANCE) 125 MG tablet Take 1 tablet (125 mg total) by mouth daily. Take for 21 days, then hold for 7 days. Repeat every 28 days. (Patient not taking: Reported on 07/13/2021) 21 tablet 3   potassium chloride SA (KLOR-CON M) 20 MEQ tablet TAKE 1 TABLET BY MOUTH TWICE DAILY 60 tablet 3   PREVIDENT 5000 PLUS 1.1 % CREA dental cream SMARTSIG:Application By Mouth     Probiotic Product (ALIGN PO) Take by mouth.     rosuvastatin (CRESTOR) 5 MG tablet TAKE 1 TABLET BY MOUTH DAILY 30 tablet 6  traZODone (DESYREL) 50 MG tablet TAKE 1 TABLET BY MOUTH DAILY 30 tablet 6   triamcinolone cream (KENALOG) 0.1 % Apply 1 application. topically 2 (two) times daily. 30 g 0   No current facility-administered medications for this visit.   Facility-Administered Medications Ordered in Other Visits  Medication Dose Route Frequency Provider Last Rate Last Admin   fulvestrant (FASLODEX) injection 500 mg  500 mg Intramuscular Once Lloyd Huger, MD        VITAL SIGNS: There were no vitals taken for this visit. There were no vitals filed for this visit.   Estimated body mass index is 34.51 kg/m as calculated from the following:   Height as of an earlier encounter on 07/13/21: _0  (1.676 m).   Weight as of an earlier encounter on 07/13/21: 97 kg (213 lb 12.8 oz).  LABS: CBC:    Component Value Date/Time   WBC 3.5 (L) 07/13/2021 0904   HGB  10.7 (L) 07/13/2021 0904   HGB 14.2 02/11/2011 1349   HCT 31.9 (L) 07/13/2021 0904   HCT 40.9 02/11/2011 1349   PLT 174 07/13/2021 0904   PLT 151 02/11/2011 1349   MCV 106.0 (H) 07/13/2021 0904   MCV 89 02/11/2011 1349   NEUTROABS 2.2 07/13/2021 0904   LYMPHSABS 0.4 (L) 07/13/2021 0904   MONOABS 0.7 07/13/2021 0904   EOSABS 0.0 07/13/2021 0904   BASOSABS 0.1 07/13/2021 0904   Comprehensive Metabolic Panel:    Component Value Date/Time   NA 135 07/13/2021 0904   NA 142 02/11/2011 1349   K 3.9 07/13/2021 0904   K 3.8 02/11/2011 1349   CL 99 07/13/2021 0904   CL 107 02/11/2011 1349   CO2 27 07/13/2021 0904   CO2 24 02/11/2011 1349   BUN 22 07/13/2021 0904   BUN 14 02/11/2011 1349   CREATININE 1.46 (H) 07/13/2021 0904   CREATININE 0.65 02/11/2011 1349   GLUCOSE 102 (H) 07/13/2021 0904   GLUCOSE 98 02/11/2011 1349   CALCIUM 9.7 07/13/2021 0904   CALCIUM 9.0 02/11/2011 1349   AST 35 07/13/2021 0904   AST 27 02/11/2011 1349   ALT 26 07/13/2021 0904   ALT 36 02/11/2011 1349   ALKPHOS 84 07/13/2021 0904   ALKPHOS 122 02/11/2011 1349   BILITOT 0.9 07/13/2021 0904   BILITOT 0.5 02/11/2011 1349   PROT 7.5 07/13/2021 0904   PROT 7.0 02/11/2011 1349   ALBUMIN 4.2 07/13/2021 0904   ALBUMIN 4.1 02/11/2011 1349    RADIOGRAPHIC STUDIES: No results found.   Assessment and Plan-  Reviewed CBC and CMP, patient is okay to resume treatment tomorrow, She is going to the dentist today for an invasive cleaning and she is still seeing an oral surgeon, who is treating an infection with oral antibiotics until the time. Per Ms. Sumler, once the infection is resolved he plans on preforming his planned surgically procedure.  Will wait to hear from her dentist about the plan for her July dental procedures so we know when/if to hold the palbociclib Continue to hold Xgeva, not yet cleared by dentist to resume treatment.   Oral Chemotherapy Adherence: No reported missed doses  New  medications: none, reported  Medication Access Issues: No issues, fills her Ibrance at [place].  Patient expressed understanding and was in agreement with this plan. She also understands that She can call clinic at any time with any questions, concerns, or complaints.   Follow-up plan: RTC in 4 weeks  Thank you for allowing  me to participate in the care of this very pleasant patient.   Time Total: 15 mins  Visit consisted of counseling and education on dealing with issues of symptom management in the setting of serious and potentially life-threatening illness.Greater than 50%  of this time was spent counseling and coordinating care related to the above assessment and plan.  Signed by: Darl Pikes, PharmD, BCPS, Salley Slaughter, CPP Hematology/Oncology Clinical Pharmacist Practitioner [facility] [phone]  07/13/2021 10:27 AM

## 2021-07-14 ENCOUNTER — Encounter: Payer: Self-pay | Admitting: Oncology

## 2021-07-14 LAB — CANCER ANTIGEN 27.29: CA 27.29: 96.5 U/mL — ABNORMAL HIGH (ref 0.0–38.6)

## 2021-07-21 ENCOUNTER — Ambulatory Visit
Admission: RE | Admit: 2021-07-21 | Discharge: 2021-07-21 | Disposition: A | Discharge status: Home / Self Care | Payer: Medicare Other | Source: Ambulatory Visit | Attending: Radiation Oncology | Admitting: Radiation Oncology

## 2021-07-21 VITALS — BP 125/71 | HR 70 | Temp 97.0°F | Resp 18 | Ht 66.0 in | Wt 217.7 lb

## 2021-07-21 DIAGNOSIS — C50912 Malignant neoplasm of unspecified site of left female breast: Secondary | ICD-10-CM | POA: Insufficient documentation

## 2021-07-21 DIAGNOSIS — Z170 Estrogen receptor positive status [ER+]: Secondary | ICD-10-CM | POA: Insufficient documentation

## 2021-07-21 DIAGNOSIS — C7951 Secondary malignant neoplasm of bone: Secondary | ICD-10-CM | POA: Insufficient documentation

## 2021-07-21 DIAGNOSIS — Z510 Encounter for antineoplastic radiation therapy: Secondary | ICD-10-CM | POA: Diagnosis present

## 2021-07-21 DIAGNOSIS — C50412 Malignant neoplasm of upper-outer quadrant of left female breast: Secondary | ICD-10-CM | POA: Diagnosis not present

## 2021-07-21 DIAGNOSIS — C50919 Malignant neoplasm of unspecified site of unspecified female breast: Secondary | ICD-10-CM

## 2021-07-21 NOTE — Progress Notes (Signed)
Radiation Oncology Follow up Note  Name: Alicia [name]   Date:   07/21/2021 MRN:  [id number] DOB: 12/22/1950    This 71 y.o. female presents to the clinic today for left iliac wing met patient with known stage IV breast cancer.  REFERRING PROVIDER: [name], MD  HPI: Patient is a 71 year old female well-known to our department having received radiation therapy for stage IV breast cancer to her left hip and scapula.  She has a left iliac wing hypermetabolic lesion consistent with bone metastasis which we have been following she is having increasing pain and some difficulty ambulating at this time.  She also has a hypermetabolic lesion in the right mandibular ramus although this is being treated by Dr. Nicholos Johns dentist for most likely infection.  She is seen today for consideration of palliative treatment to her left hip for the progressive pain..  COMPLICATIONS OF TREATMENT: none  FOLLOW UP COMPLIANCE: keeps appointments   PHYSICAL EXAM:  BP 125/71   Pulse 70   Temp (!) 97 F (36.1 C)   Resp 18   Ht 5' 6"  (1.676 m)   Wt 217 lb 11.2 oz (98.7 kg)   BMI 35.14 kg/m  Range of motion of the left hip does cause some slight pain.  Motor and sensory levels in the lower extremities are equal and symmetric proprioception is intact.  Well-developed well-nourished patient in NAD. HEENT reveals PERLA, EOMI, discs not visualized.  Oral cavity is clear. No oral mucosal lesions are identified. Neck is clear without evidence of cervical or supraclavicular adenopathy. Lungs are clear to A&P. Cardiac examination is essentially unremarkable with regular rate and rhythm without murmur rub or thrill. Abdomen is benign with no organomegaly or masses noted. Motor sensory and DTR levels are equal and symmetric in the upper and lower extremities. Cranial nerves II through XII are grossly intact. Proprioception is intact. No peripheral adenopathy or edema is identified. No motor or sensory levels are noted.  Crude visual fields are within normal range.  RADIOLOGY RESULTS: PET scan reviewed compatible with above-stated findings  PLAN: At this time elected ahead with palliative radiation therapy to her left iliac wing.  We will plan on delivering 30 Gray in 10 fractions.  Risks and benefits of treatment including skin reaction fatigue alteration of blood counts were all reviewed with the patient.  We will use her previous treatment fields to avoid possible overlap of treatment fields.  I have personally set up and ordered CT simulation for early next week.  Patient comprehends my recommendations well.  I would like to take this opportunity to thank you for allowing me to participate in the care of your patient.Noreene Filbert, MD

## 2021-07-25 ENCOUNTER — Ambulatory Visit
Admission: RE | Admit: 2021-07-25 | Discharge: 2021-07-25 | Disposition: A | Discharge status: Home / Self Care | Payer: Medicare Other | Source: Ambulatory Visit | Attending: Radiation Oncology | Admitting: Radiation Oncology

## 2021-07-25 DIAGNOSIS — C50412 Malignant neoplasm of upper-outer quadrant of left female breast: Secondary | ICD-10-CM | POA: Diagnosis not present

## 2021-07-25 DIAGNOSIS — C7951 Secondary malignant neoplasm of bone: Secondary | ICD-10-CM | POA: Diagnosis not present

## 2021-07-25 DIAGNOSIS — C50912 Malignant neoplasm of unspecified site of left female breast: Secondary | ICD-10-CM | POA: Diagnosis not present

## 2021-07-25 DIAGNOSIS — Z170 Estrogen receptor positive status [ER+]: Secondary | ICD-10-CM | POA: Diagnosis not present

## 2021-07-26 DIAGNOSIS — C50912 Malignant neoplasm of unspecified site of left female breast: Secondary | ICD-10-CM | POA: Diagnosis not present

## 2021-07-26 DIAGNOSIS — C50412 Malignant neoplasm of upper-outer quadrant of left female breast: Secondary | ICD-10-CM | POA: Diagnosis not present

## 2021-07-26 DIAGNOSIS — Z170 Estrogen receptor positive status [ER+]: Secondary | ICD-10-CM | POA: Diagnosis not present

## 2021-07-26 DIAGNOSIS — C7951 Secondary malignant neoplasm of bone: Secondary | ICD-10-CM | POA: Diagnosis not present

## 2021-07-31 ENCOUNTER — Ambulatory Visit: Admission: RE | Admit: 2021-07-31 | Payer: Medicare Other | Source: Ambulatory Visit

## 2021-07-31 DIAGNOSIS — Z170 Estrogen receptor positive status [ER+]: Secondary | ICD-10-CM | POA: Diagnosis not present

## 2021-07-31 DIAGNOSIS — C50412 Malignant neoplasm of upper-outer quadrant of left female breast: Secondary | ICD-10-CM | POA: Diagnosis not present

## 2021-07-31 DIAGNOSIS — C50912 Malignant neoplasm of unspecified site of left female breast: Secondary | ICD-10-CM | POA: Diagnosis not present

## 2021-07-31 DIAGNOSIS — C7951 Secondary malignant neoplasm of bone: Secondary | ICD-10-CM | POA: Diagnosis not present

## 2021-08-01 ENCOUNTER — Other Ambulatory Visit: Payer: Self-pay | Admitting: Pharmacist

## 2021-08-01 ENCOUNTER — Other Ambulatory Visit (HOSPITAL_COMMUNITY): Payer: Self-pay

## 2021-08-01 ENCOUNTER — Other Ambulatory Visit: Payer: Self-pay

## 2021-08-01 ENCOUNTER — Ambulatory Visit
Admission: RE | Admit: 2021-08-01 | Discharge: 2021-08-01 | Disposition: A | Discharge status: Home / Self Care | Payer: Medicare Other | Source: Ambulatory Visit | Attending: Radiation Oncology | Admitting: Radiation Oncology

## 2021-08-01 DIAGNOSIS — Z170 Estrogen receptor positive status [ER+]: Secondary | ICD-10-CM | POA: Diagnosis not present

## 2021-08-01 DIAGNOSIS — C7951 Secondary malignant neoplasm of bone: Secondary | ICD-10-CM | POA: Diagnosis not present

## 2021-08-01 DIAGNOSIS — C50412 Malignant neoplasm of upper-outer quadrant of left female breast: Secondary | ICD-10-CM | POA: Diagnosis not present

## 2021-08-01 DIAGNOSIS — C50912 Malignant neoplasm of unspecified site of left female breast: Secondary | ICD-10-CM

## 2021-08-01 LAB — RAD ONC ARIA SESSION SUMMARY
Course Elapsed Days: 0
Plan Fractions Treated to Date: 1
Plan Prescribed Dose Per Fraction: 3 Gy
Plan Total Fractions Prescribed: 10
Plan Total Prescribed Dose: 30 Gy
Reference Point Dosage Given to Date: 3 Gy
Reference Point Session Dosage Given: 3 Gy
Session Number: 1

## 2021-08-01 MED ORDER — PALBOCICLIB 125 MG PO TABS
125.0000 mg | ORAL_TABLET | Freq: Every day | ORAL | 3 refills | Status: DC
  Filled 2021-08-01: qty 21, 28d supply, fill #0
  Filled 2021-08-28: qty 21, 28d supply, fill #1
  Filled 2021-10-03: qty 21, 28d supply, fill #2
  Filled 2021-10-26: qty 21, 28d supply, fill #3

## 2021-08-02 ENCOUNTER — Ambulatory Visit
Admission: RE | Admit: 2021-08-02 | Discharge: 2021-08-02 | Disposition: A | Discharge status: Home / Self Care | Payer: Medicare Other | Source: Ambulatory Visit | Attending: Radiation Oncology | Admitting: Radiation Oncology

## 2021-08-02 ENCOUNTER — Other Ambulatory Visit: Payer: Self-pay

## 2021-08-02 DIAGNOSIS — C7951 Secondary malignant neoplasm of bone: Secondary | ICD-10-CM | POA: Diagnosis not present

## 2021-08-02 DIAGNOSIS — C50912 Malignant neoplasm of unspecified site of left female breast: Secondary | ICD-10-CM | POA: Diagnosis not present

## 2021-08-02 DIAGNOSIS — Z170 Estrogen receptor positive status [ER+]: Secondary | ICD-10-CM | POA: Diagnosis not present

## 2021-08-02 LAB — RAD ONC ARIA SESSION SUMMARY
Course Elapsed Days: 1
Plan Fractions Treated to Date: 2
Plan Prescribed Dose Per Fraction: 3 Gy
Plan Total Fractions Prescribed: 10
Plan Total Prescribed Dose: 30 Gy
Reference Point Dosage Given to Date: 6 Gy
Reference Point Session Dosage Given: 3 Gy
Session Number: 2

## 2021-08-03 ENCOUNTER — Other Ambulatory Visit: Payer: Self-pay

## 2021-08-03 ENCOUNTER — Ambulatory Visit
Admission: RE | Admit: 2021-08-03 | Discharge: 2021-08-03 | Disposition: A | Discharge status: Home / Self Care | Payer: Medicare Other | Source: Ambulatory Visit | Attending: Radiation Oncology | Admitting: Radiation Oncology

## 2021-08-03 DIAGNOSIS — C50912 Malignant neoplasm of unspecified site of left female breast: Secondary | ICD-10-CM | POA: Diagnosis not present

## 2021-08-03 DIAGNOSIS — Z170 Estrogen receptor positive status [ER+]: Secondary | ICD-10-CM | POA: Diagnosis not present

## 2021-08-03 DIAGNOSIS — C7951 Secondary malignant neoplasm of bone: Secondary | ICD-10-CM | POA: Diagnosis not present

## 2021-08-03 LAB — RAD ONC ARIA SESSION SUMMARY
Course Elapsed Days: 2
Plan Fractions Treated to Date: 3
Plan Prescribed Dose Per Fraction: 3 Gy
Plan Total Fractions Prescribed: 10
Plan Total Prescribed Dose: 30 Gy
Reference Point Dosage Given to Date: 9 Gy
Reference Point Session Dosage Given: 3 Gy
Session Number: 3

## 2021-08-04 ENCOUNTER — Other Ambulatory Visit: Payer: Self-pay

## 2021-08-04 ENCOUNTER — Ambulatory Visit
Admission: RE | Admit: 2021-08-04 | Discharge: 2021-08-04 | Disposition: A | Discharge status: Home / Self Care | Payer: Medicare Other | Source: Ambulatory Visit | Attending: Radiation Oncology | Admitting: Radiation Oncology

## 2021-08-04 DIAGNOSIS — Z170 Estrogen receptor positive status [ER+]: Secondary | ICD-10-CM | POA: Diagnosis not present

## 2021-08-04 DIAGNOSIS — C7951 Secondary malignant neoplasm of bone: Secondary | ICD-10-CM | POA: Diagnosis not present

## 2021-08-04 DIAGNOSIS — C50912 Malignant neoplasm of unspecified site of left female breast: Secondary | ICD-10-CM | POA: Diagnosis not present

## 2021-08-04 LAB — RAD ONC ARIA SESSION SUMMARY
Course Elapsed Days: 3
Plan Fractions Treated to Date: 4
Plan Prescribed Dose Per Fraction: 3 Gy
Plan Total Fractions Prescribed: 10
Plan Total Prescribed Dose: 30 Gy
Reference Point Dosage Given to Date: 12 Gy
Reference Point Session Dosage Given: 3 Gy
Session Number: 4

## 2021-08-07 ENCOUNTER — Ambulatory Visit
Admission: RE | Admit: 2021-08-07 | Discharge: 2021-08-07 | Disposition: A | Discharge status: Home / Self Care | Payer: Medicare Other | Source: Ambulatory Visit | Attending: Radiation Oncology | Admitting: Radiation Oncology

## 2021-08-07 ENCOUNTER — Other Ambulatory Visit (HOSPITAL_COMMUNITY): Payer: Self-pay

## 2021-08-07 ENCOUNTER — Other Ambulatory Visit: Payer: Medicare Other

## 2021-08-07 ENCOUNTER — Other Ambulatory Visit: Payer: Self-pay

## 2021-08-07 DIAGNOSIS — C7951 Secondary malignant neoplasm of bone: Secondary | ICD-10-CM | POA: Diagnosis not present

## 2021-08-07 DIAGNOSIS — Z170 Estrogen receptor positive status [ER+]: Secondary | ICD-10-CM | POA: Diagnosis not present

## 2021-08-07 DIAGNOSIS — C50912 Malignant neoplasm of unspecified site of left female breast: Secondary | ICD-10-CM | POA: Diagnosis not present

## 2021-08-07 LAB — RAD ONC ARIA SESSION SUMMARY
Course Elapsed Days: 6
Plan Fractions Treated to Date: 5
Plan Prescribed Dose Per Fraction: 3 Gy
Plan Total Fractions Prescribed: 10
Plan Total Prescribed Dose: 30 Gy
Reference Point Dosage Given to Date: 15 Gy
Reference Point Session Dosage Given: 3 Gy
Session Number: 5

## 2021-08-08 ENCOUNTER — Other Ambulatory Visit: Payer: Self-pay

## 2021-08-08 ENCOUNTER — Ambulatory Visit
Admission: RE | Admit: 2021-08-08 | Discharge: 2021-08-08 | Disposition: A | Discharge status: Home / Self Care | Payer: Medicare Other | Source: Ambulatory Visit | Attending: Radiation Oncology | Admitting: Radiation Oncology

## 2021-08-08 DIAGNOSIS — C50412 Malignant neoplasm of upper-outer quadrant of left female breast: Secondary | ICD-10-CM | POA: Diagnosis not present

## 2021-08-08 DIAGNOSIS — C50912 Malignant neoplasm of unspecified site of left female breast: Secondary | ICD-10-CM | POA: Diagnosis not present

## 2021-08-08 DIAGNOSIS — Z170 Estrogen receptor positive status [ER+]: Secondary | ICD-10-CM | POA: Diagnosis not present

## 2021-08-08 DIAGNOSIS — C7951 Secondary malignant neoplasm of bone: Secondary | ICD-10-CM | POA: Diagnosis not present

## 2021-08-08 LAB — RAD ONC ARIA SESSION SUMMARY
Course Elapsed Days: 7
Plan Fractions Treated to Date: 6
Plan Prescribed Dose Per Fraction: 3 Gy
Plan Total Fractions Prescribed: 10
Plan Total Prescribed Dose: 30 Gy
Reference Point Dosage Given to Date: 18 Gy
Reference Point Session Dosage Given: 3 Gy
Session Number: 6

## 2021-08-09 ENCOUNTER — Other Ambulatory Visit: Payer: Self-pay

## 2021-08-09 ENCOUNTER — Ambulatory Visit
Admission: RE | Admit: 2021-08-09 | Discharge: 2021-08-09 | Disposition: A | Discharge status: Home / Self Care | Payer: Medicare Other | Source: Ambulatory Visit | Attending: Radiation Oncology | Admitting: Radiation Oncology

## 2021-08-09 DIAGNOSIS — C7951 Secondary malignant neoplasm of bone: Secondary | ICD-10-CM | POA: Diagnosis not present

## 2021-08-09 DIAGNOSIS — C50912 Malignant neoplasm of unspecified site of left female breast: Secondary | ICD-10-CM | POA: Diagnosis not present

## 2021-08-09 DIAGNOSIS — Z170 Estrogen receptor positive status [ER+]: Secondary | ICD-10-CM | POA: Diagnosis not present

## 2021-08-09 LAB — RAD ONC ARIA SESSION SUMMARY
Course Elapsed Days: 8
Plan Fractions Treated to Date: 7
Plan Prescribed Dose Per Fraction: 3 Gy
Plan Total Fractions Prescribed: 10
Plan Total Prescribed Dose: 30 Gy
Reference Point Dosage Given to Date: 21 Gy
Reference Point Session Dosage Given: 3 Gy
Session Number: 7

## 2021-08-10 ENCOUNTER — Ambulatory Visit
Admission: RE | Admit: 2021-08-10 | Discharge: 2021-08-10 | Disposition: A | Discharge status: Home / Self Care | Payer: Medicare Other | Source: Ambulatory Visit | Attending: Radiation Oncology | Admitting: Radiation Oncology

## 2021-08-10 ENCOUNTER — Inpatient Hospital Stay (HOSPITAL_BASED_OUTPATIENT_CLINIC_OR_DEPARTMENT_OTHER): Payer: Medicare Other | Admitting: Oncology

## 2021-08-10 ENCOUNTER — Inpatient Hospital Stay: Payer: Medicare Other

## 2021-08-10 ENCOUNTER — Encounter: Payer: Self-pay | Admitting: Oncology

## 2021-08-10 ENCOUNTER — Inpatient Hospital Stay: Payer: Medicare Other | Attending: Oncology

## 2021-08-10 ENCOUNTER — Other Ambulatory Visit: Payer: Self-pay | Admitting: *Deleted

## 2021-08-10 ENCOUNTER — Other Ambulatory Visit: Payer: Self-pay

## 2021-08-10 ENCOUNTER — Inpatient Hospital Stay: Payer: Medicare Other | Admitting: Pharmacist

## 2021-08-10 VITALS — BP 124/53 | HR 65 | Temp 97.1°F | Resp 18 | Wt 215.0 lb

## 2021-08-10 DIAGNOSIS — Z1231 Encounter for screening mammogram for malignant neoplasm of breast: Secondary | ICD-10-CM

## 2021-08-10 DIAGNOSIS — C7951 Secondary malignant neoplasm of bone: Secondary | ICD-10-CM | POA: Diagnosis not present

## 2021-08-10 DIAGNOSIS — C50912 Malignant neoplasm of unspecified site of left female breast: Secondary | ICD-10-CM

## 2021-08-10 DIAGNOSIS — Z510 Encounter for antineoplastic radiation therapy: Secondary | ICD-10-CM | POA: Insufficient documentation

## 2021-08-10 DIAGNOSIS — C50919 Malignant neoplasm of unspecified site of unspecified female breast: Secondary | ICD-10-CM

## 2021-08-10 LAB — CBC WITH DIFFERENTIAL/PLATELET
Abs Immature Granulocytes: 0 10*3/uL (ref 0.00–0.07)
Basophils Absolute: 0 10*3/uL (ref 0.0–0.1)
Basophils Relative: 1 %
Eosinophils Absolute: 0 10*3/uL (ref 0.0–0.5)
Eosinophils Relative: 1 %
HCT: 28.9 % — ABNORMAL LOW (ref 36.0–46.0)
Hemoglobin: 9.9 g/dL — ABNORMAL LOW (ref 12.0–15.0)
Immature Granulocytes: 0 %
Lymphocytes Relative: 20 %
Lymphs Abs: 0.3 10*3/uL — ABNORMAL LOW (ref 0.7–4.0)
MCH: 35.4 pg — ABNORMAL HIGH (ref 26.0–34.0)
MCHC: 34.3 g/dL (ref 30.0–36.0)
MCV: 103.2 fL — ABNORMAL HIGH (ref 80.0–100.0)
Monocytes Absolute: 0.2 10*3/uL (ref 0.1–1.0)
Monocytes Relative: 15 %
Neutro Abs: 0.8 10*3/uL — ABNORMAL LOW (ref 1.7–7.7)
Neutrophils Relative %: 63 %
Platelets: 88 10*3/uL — ABNORMAL LOW (ref 150–400)
RBC: 2.8 MIL/uL — ABNORMAL LOW (ref 3.87–5.11)
RDW: 15.9 % — ABNORMAL HIGH (ref 11.5–15.5)
Smear Review: NORMAL
WBC: 1.3 10*3/uL — CL (ref 4.0–10.5)
nRBC: 0 % (ref 0.0–0.2)

## 2021-08-10 LAB — RAD ONC ARIA SESSION SUMMARY
Course Elapsed Days: 9
Plan Fractions Treated to Date: 8
Plan Prescribed Dose Per Fraction: 3 Gy
Plan Total Fractions Prescribed: 10
Plan Total Prescribed Dose: 30 Gy
Reference Point Dosage Given to Date: 24 Gy
Reference Point Session Dosage Given: 3 Gy
Session Number: 8

## 2021-08-10 LAB — COMPREHENSIVE METABOLIC PANEL
ALT: 25 U/L (ref 0–44)
AST: 33 U/L (ref 15–41)
Albumin: 4 g/dL (ref 3.5–5.0)
Alkaline Phosphatase: 81 U/L (ref 38–126)
Anion gap: 7 (ref 5–15)
BUN: 20 mg/dL (ref 8–23)
CO2: 27 mmol/L (ref 22–32)
Calcium: 9.5 mg/dL (ref 8.9–10.3)
Chloride: 105 mmol/L (ref 98–111)
Creatinine, Ser: 1.38 mg/dL — ABNORMAL HIGH (ref 0.44–1.00)
GFR, Estimated: 41 mL/min — ABNORMAL LOW (ref >60)
Glucose, Bld: 98 mg/dL (ref 70–99)
Potassium: 4.2 mmol/L (ref 3.5–5.1)
Sodium: 139 mmol/L (ref 135–145)
Total Bilirubin: 0.5 mg/dL (ref 0.3–1.2)
Total Protein: 7.2 g/dL (ref 6.5–8.1)

## 2021-08-10 MED ORDER — TRAMADOL HCL 50 MG PO TABS: 50.0000 mg | ORAL_TABLET | Freq: Four times a day (QID) | ORAL | 0 refills | Status: DC | PRN

## 2021-08-10 NOTE — Progress Notes (Signed)
[place]  Telephone:([phone]) [phone] Fax:(336) [phone]  ID: Alicia [name] OB: 28-Aug-1950  MR#: [id number]  FHQ#:[id number]  Patient Care Team: [name], [name], Harrell Gave, [name] Rico Junker, RN as Registered Nurse Lloyd Huger, MD as Consulting Physician (Oncology) Bary Castilla Forest Gleason, MD as Consulting Physician (General Surgery) Bary Castilla Forest Gleason, MD as Consulting Physician (General Surgery) Noreene Filbert, MD as Referring Physician (Radiation Oncology) Jeral Fruit, RN as Registered Nurse   CHIEF COMPLAINT: Recurrent stage IV ER/PR positive, 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  INTERVAL HISTORY: Patient returns to clinic today for further evaluation and continuation of Ibrance and Faslodex.  She continues to have jaw pain and has consultation with an oral surgeon later next week.  Her left flank pain is evident but improved.  She does not complain of weakness or fatigue today.  She continues to have a mild peripheral neuropathy, but no other neurologic complaints.  She denies any chest pain, shortness of breath, cough, or hemoptysis.  She denies any nausea, vomiting, constipation, or diarrhea.  She has no urinary complaints.  Patient offers no further specific complaints today.  REVIEW OF SYSTEMS:   Review of Systems  Constitutional: Negative.  Negative for fever, malaise/fatigue and weight loss.  HENT:  Negative for congestion.   Respiratory: Negative.  Negative for cough and shortness of breath.   Cardiovascular: Negative.  Negative for chest pain and leg swelling.  Gastrointestinal: Negative.  Negative for abdominal pain, constipation, diarrhea and nausea.  Genitourinary:  Positive for flank pain. Negative for dysuria and urgency.  Musculoskeletal:  Negative for back pain and joint pain.  Skin: Negative.  Negative for rash.  Neurological:   Positive for tingling and sensory change. Negative for dizziness, focal weakness, weakness and headaches.  Psychiatric/Behavioral:  The patient is not nervous/anxious.     As per HPI. Otherwise, a complete review of systems is negative.  PAST MEDICAL HISTORY: Past Medical History:  Diagnosis Date   Anxiety    Breast cancer, left ([place]) 07/2017   Depression    Hypertension    Hypothyroidism    Personal history of chemotherapy 2019   LEFT mastectomy-chemo before   Personal history of radiation therapy 03/2018   LEFT mastectomy   Rapid heart rate    Thyroid disease     PAST SURGICAL HISTORY: Past Surgical History:  Procedure Laterality Date   AXILLARY LYMPH NODE BIOPSY Left 07/16/2017   METASTATIC MAMMARY CARCINOMA   BREAST BIOPSY Left 07/16/2017   [place] bx of left breast mass and left breast LN.  INVASIVE MAMMARY CARCINOMA, NO SPECIAL TYPE.    BREAST EXCISIONAL BIOPSY Right 2001   benign   BREAST LUMPECTOMY WITH SENTINEL LYMPH NODE BIOPSY Left 12/06/2017   Procedure: BREAST LUMPECTOMY WITH SENTINEL LYMPH NODE BX;  Surgeon: Robert Bellow, MD;  Location: [facility] OR[facility];  Service: General;  Laterality: Left;   COLONOSCOPY     MASTECTOMY Left 12/23/2017   PORTACATH PLACEMENT Right 08/07/2017   Procedure: INSERTION PORT-A-CATH;  Surgeon: Robert Bellow, MD;  Location: [facility];  Service: General;  Laterality: Right;   SIMPLE MASTECTOMY WITH AXILLARY SENTINEL NODE BIOPSY Left 12/23/2017   T2,N2 with 6/7 nodes positive. Whole breast radiation.  Surgeon: Robert Bellow, MD;  Location: [facility] OR[facility];  Service: General;  Laterality: Left;   TONSILLECTOMY      FAMILY HISTORY: Family History  Problem Relation Age of Onset  Stroke Mother    Thyroid disease Mother    Renal Disease Mother    Stroke Father    Heart attack Father    Sudden death Father 61       suicide   Anuerysm Brother    Breast cancer Neg Hx     ADVANCED DIRECTIVES (Y/N):  N  HEALTH MAINTENANCE: Social History    Tobacco Use   Smoking status: Former    Packs/day: 1.00    Years: 30.00    Total pack years: 30.00    Types: Cigarettes    Quit date: 2019    Years since quitting: 4.5   Smokeless tobacco: Never  Vaping Use   Vaping Use: Never used  Substance Use Topics   Alcohol use: Not Currently   Drug use: Never     Colonoscopy:  PAP:  Bone density:  Lipid panel:  Allergies  Allergen Reactions   Sulfa Antibiotics Diarrhea    Current Outpatient Medications  Medication Sig Dispense Refill   acetaminophen (TYLENOL) 500 MG tablet Take 1,000 mg by mouth every 6 (six) hours as needed for moderate pain or headache.      ALPRAZolam (XANAX) 0.5 MG tablet Take 1 tablet (0.5 mg total) by mouth 2 (two) times daily. (Patient taking differently: Take 0.5 mg by mouth 2 (two) times daily. Taking once/day) 60 tablet 1   amLODipine (NORVASC) 5 MG tablet Take 1 tablet (5 mg total) by mouth daily. 90 tablet 3   aspirin EC 81 MG tablet Take 81 mg by mouth at bedtime.      calcium carbonate (OS-CAL - DOSED IN MG OF ELEMENTAL CALCIUM) 1250 (500 Ca) MG tablet Take 1 tablet by mouth daily with breakfast.     chlorhexidine (PERIDEX) 0.12 % solution SMARTSIG:By Mouth     Cholecalciferol (VITAMIN D3) 125 MCG (5000 UT) CAPS Take 1 capsule by mouth 2 (two) times a day.     docusate sodium (COLACE) 100 MG capsule Take 100 mg by mouth 3 (three) times daily as needed for mild constipation.     Erythromycin 500 MG TBEC Take 1 tablet by mouth 3 (three) times daily.     hydrochlorothiazide (HYDRODIURIL) 12.5 MG tablet Take 1 tablet (12.5 mg total) by mouth daily. 90 tablet 3   levothyroxine (SYNTHROID) 100 MCG tablet TAKE 1 TABLET BY MOUTH DAILY 90 tablet 3   nystatin-lidocaine-prednisoLONE-diphenhydrAMINE-distilled water-alum & mag hydroxide-simeth Swish and spit 5-73ml four times a day as needed 480 mL 3   olopatadine (PATANOL) 0.1 % ophthalmic solution Place 1 drop into the right eye 2 (two) times daily. 5 mL 12    palbociclib (IBRANCE) 125 MG tablet Take 1 tablet (125 mg total) by mouth daily. Take for 21 days, then hold for 7 days. Repeat every 28 days. 21 tablet 3   potassium chloride SA (KLOR-CON M) 20 MEQ tablet TAKE 1 TABLET BY MOUTH TWICE DAILY 60 tablet 3   PREVIDENT 5000 PLUS 1.1 % CREA dental cream SMARTSIG:Application By Mouth     Probiotic Product (ALIGN PO) Take by mouth.     rosuvastatin (CRESTOR) 5 MG tablet TAKE 1 TABLET BY MOUTH DAILY 30 tablet 6   traMADol (ULTRAM) 50 MG tablet Take 1 tablet (50 mg total) by mouth every 6 (six) hours as needed. 60 tablet 0   traZODone (DESYREL) 50 MG tablet TAKE 1 TABLET BY MOUTH DAILY 30 tablet 6   triamcinolone cream (KENALOG) 0.1 % Apply 1 application. topically 2 (two) times daily. 30 g  0   No current facility-administered medications for this visit.    OBJECTIVE: Vitals:   08/10/21 1022  BP: (!) 124/53  Pulse: 65  Resp: 18  Temp: (!) 97.1 F (36.2 C)     Body mass index is 34.7 kg/m.    ECOG FS:0 - Asymptomatic  General: Well-developed, well-nourished, no acute distress. Eyes: Pink conjunctiva, anicteric sclera. HEENT: Normocephalic, moist mucous membranes. Lungs: No audible wheezing or coughing. Heart: Regular rate and rhythm. Abdomen: Soft, nontender, no obvious distention. Musculoskeletal: No edema, cyanosis, or clubbing. Neuro: Alert, answering all questions appropriately. Cranial nerves grossly intact. Skin: No rashes or petechiae noted. Psych: Normal affect.  LAB RESULTS:  Lab Results  Component Value Date   NA 139 08/10/2021   K 4.2 08/10/2021   CL 105 08/10/2021   CO2 27 08/10/2021   GLUCOSE 98 08/10/2021   BUN 20 08/10/2021   CREATININE 1.38 (H) 08/10/2021   CALCIUM 9.5 08/10/2021   PROT 7.2 08/10/2021   ALBUMIN 4.0 08/10/2021   AST 33 08/10/2021   ALT 25 08/10/2021   ALKPHOS 81 08/10/2021   BILITOT 0.5 08/10/2021   GFRNONAA 41 (L) 08/10/2021   GFRAA 57 (L) 10/01/2019    Lab Results  Component Value Date    WBC 1.3 (LL) 08/10/2021   NEUTROABS 0.8 (L) 08/10/2021   HGB 9.9 (L) 08/10/2021   HCT 28.9 (L) 08/10/2021   MCV 103.2 (H) 08/10/2021   PLT 88 (L) 08/10/2021     STUDIES: No results found.   ONCOLOGY HISTORY: Patient initially received neoadjuvant chemotherapy with Adriamycin and Cytoxan followed by weekly Taxol. She only received 4 cycles of weekly Taxol prior to discontinuation of treatment on October 31, 2017 secondary to persistent peripheral neuropathy.  She ultimately required mastectomy and final pathology noted 5 of 6 lymph nodes positive for disease.  Patient completed adjuvant XRT in mid April 2020.  Pain initiated letrozole in May 2020, this was discontinued secondary to side effects and patient was started on anastrozole.  Nuclear medicine bone scan on June 19, 2019 revealed metastatic disease and anastrozole was subsequently discontinued.  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  PLAN:    1. 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  MRI of the brain on August 24, 2019 reviewed independently with no obvious evidence of metastatic disease.  Repeat PET scan on March 02, 2021 revealed a hypermetabolic area in her left lateral iliac wing, but no other possible evidence of disease.  Hypermetabolism in her jaw is likely secondary to her ongoing dental issues.  Patient's CA 27-29 initially increased to 141.9, but appears to have plateaued since November 2021 between 46.9 and 96.5.  Today's result is 87.1.  Given patient's thrombocytopenia and neutropenia, she has been instructed to delay initiating her next cycle of Ibrance by 1 week.  We will also hold Faslodex today.  Return to clinic in 1 week for repeat laboratory work, evaluation by clinical  pharmacist, and continuation of treatment. Continue to hold Xgeva at this time given her issues with her mandible.  Patient will then return to clinic in 5 weeks for further evaluation and continuation of treatment. 2.  Peripheral neuropathy: Chronic and unchanged. 3.  History of pulmonary embolus: Patient was diagnosed with a small pulmonary embolus on January 10, 2018.  She is  no longer on anticoagulation.  There was no evidence of recurrent PE on CT scan from October 27, 2019. 4.  Osteopenia: Patient's most recent bone mineral density on March 03, 2019 reported a T score of -1.8 which is only mildly decreased from 1 year prior when the reported T score was -1.6.  Continue calcium and vitamin D supplementation.  Continue to hold Xgeva as above. 5.  Left hip/flank pain: Continue XRT as scheduled.  Patient was also given a prescription for tramadol today. 6.  Leukopenia: Improved.  This has ranged between 1.6 and 3.5 since July 2021.  Her total white count has dropped to 1.3, hold Ibrance as above. 7.  Anemia: Hemoglobin has trended down slightly to 9.9.   8.  Renal insufficiency: Chronic and unchanged.  Patient's creatinine is 1.38.   9.  Hypokalemia: Resolved.  Continue oral supplementation as prescribed.   10.  UTI: Resolved. 11.  Dental pathology: Patient has an appointment with an oral surgeon, Dr. Hampton Abbot, next week. 12.  Thrombocytopenia: Platelets have declined to 88.  Hold Ibrance 1 week as above.    Patient expressed understanding and was in agreement with this plan. She also understands that She can call clinic at any time with any questions, concerns, or complaints.    Cancer Staging  Breast cancer, stage 2, left ([place]) Staging form: Breast, AJCC 8th Edition - Clinical stage from 07/30/2017: Stage IIA (cT2, cN1, cM0, G2, ER+, PR+, HER2-) - Signed by Lloyd Huger, MD on 07/30/2017 Histologic grading system: 3 grade system Laterality: Left   Lloyd Huger, MD   08/11/2021  2:59 PM

## 2021-08-10 NOTE — Progress Notes (Signed)
[place]  [phone] [phone]  Patient Care Team: [name], [name], Harrell Gave, [name] Rico Junker, RN as Registered Nurse Grayland Ormond Kathlene November, MD as Consulting Physician (Oncology) Bary Castilla Forest Gleason, MD as Consulting Physician (General Surgery) Bary Castilla Forest Gleason, MD as Consulting Physician (General Surgery) Noreene Filbert, MD as Referring Physician (Radiation Oncology) Jeral Fruit, RN as Registered Nurse   Name of the patient: Alicia [name]  [id number]  [birth year]   Date of visit: 08/10/21  HPI: Patient is a 71 y.o. female with recurrent stage IV ER/PR positive, HER2 negative breast cancer. Patient started on Ibrance on 07/09/19.   Reason for Consult: Oral chemotherapy follow-up for Ibrance (palbociclib) therapy.   PAST MEDICAL HISTORY: Past Medical History:  Diagnosis Date   Anxiety    Breast cancer, left ([place]) 07/2017   Depression    Hypertension    Hypothyroidism    Personal history of chemotherapy 2019   LEFT mastectomy-chemo before   Personal history of radiation therapy 03/2018   LEFT mastectomy   Rapid heart rate    Thyroid disease     PAST SURGICAL HISTORY:  Past Surgical History:  Procedure Laterality Date   AXILLARY LYMPH NODE BIOPSY Left 07/16/2017   METASTATIC MAMMARY CARCINOMA   BREAST BIOPSY Left 07/16/2017   [place] bx of left breast mass and left breast LN.  INVASIVE MAMMARY CARCINOMA, NO SPECIAL TYPE.    BREAST EXCISIONAL BIOPSY Right 2001   benign   BREAST LUMPECTOMY WITH SENTINEL LYMPH NODE BIOPSY Left 12/06/2017   Procedure: BREAST LUMPECTOMY WITH SENTINEL LYMPH NODE BX;  Surgeon: Robert Bellow, MD;  Location: [facility] ORS;  Service: General;  Laterality: Left;   COLONOSCOPY     MASTECTOMY Left 12/23/2017   PORTACATH PLACEMENT Right 08/07/2017   Procedure: INSERTION PORT-A-CATH;  Surgeon:  Robert Bellow, MD;  Location: [facility] ORS;  Service: General;  Laterality: Right;   SIMPLE MASTECTOMY WITH AXILLARY SENTINEL NODE BIOPSY Left 12/23/2017   T2,N2 with 6/7 nodes positive. Whole breast radiation.  Surgeon: Robert Bellow, MD;  Location: [facility] ORS;  Service: General;  Laterality: Left;   TONSILLECTOMY      HEMATOLOGY/ONCOLOGY HISTORY:  Oncology History Overview Note  Patient is a 71 year old female who recently self palpated a mass on her left breast.  Subsequent imaging and biopsy revealed the above-stated breast cancer.  Case was also discussed extensively at case conference.  Given the size and the stage of patient's malignancy, she will benefit from neoadjuvant chemotherapy using Adriamycin, Cytoxan, and Taxol.  Patient will also require Neulasta support.  Will get CT scan of the chest, abdomen, and pelvis to assess for any metastatic disease.  Patient will also require port placement and MUGA prior to initiating treatment  CT abdomen/pelvis/chest did not reveal any suspicious lesions concerning for metastatic disease. (08/01/17)  Port-A-Cath placed on 08/07/2017.  Cycle 1 day 1 of AC was given on 08/08/17.     Breast cancer, stage 2, left ([place])  07/24/2017 Initial Diagnosis   Breast cancer, stage 2, left ([place])   07/30/2017 Cancer Staging   Staging form: Breast, AJCC 8th Edition - Clinical stage from 07/30/2017: Stage IIA (cT2, cN1, cM0, G2, ER+, PR+, HER2-) - Signed by Lloyd Huger, MD on 07/30/2017   08/08/2017 - 10/31/2017 Chemotherapy   The patient had DOXOrubicin (ADRIAMYCIN) chemo injection 130 mg, 60 mg/m2 = 130 mg, Intravenous,  Once, 4  of 4 cycles Administration: 130 mg (08/08/2017), 130 mg (08/22/2017), 130 mg (09/05/2017), 130 mg (09/19/2017) palonosetron (ALOXI) injection 0.25 mg, 0.25 mg, Intravenous,  Once, 4 of 4 cycles Administration: 0.25 mg (08/08/2017), 0.25 mg (08/22/2017), 0.25 mg (09/05/2017), 0.25 mg (09/19/2017) pegfilgrastim-cbqv (UDENYCA) injection 6  mg, 6 mg, Subcutaneous, Once, 4 of 4 cycles Administration: 6 mg (08/09/2017), 6 mg (08/23/2017), 6 mg (09/06/2017), 6 mg (09/20/2017) cyclophosphamide (CYTOXAN) 1,300 mg in sodium chloride 0.9 % 250 mL chemo infusion, 600 mg/m2 = 1,300 mg, Intravenous,  Once, 4 of 4 cycles Administration: 1,300 mg (08/08/2017), 1,300 mg (08/22/2017), 1,300 mg (09/05/2017), 1,300 mg (09/19/2017) PACLitaxel (TAXOL) 174 mg in sodium chloride 0.9 % 250 mL chemo infusion (</= 47m/m2), 80 mg/m2 = 174 mg, Intravenous,  Once, 4 of 12 cycles Dose modification: 72 mg/m2 (original dose 80 mg/m2, Cycle 6, Reason: Dose not tolerated) Administration: 174 mg (10/03/2017), 156 mg (10/17/2017), 156 mg (10/24/2017), 156 mg (10/31/2017) fosaprepitant (EMEND) 150 mg, dexamethasone (DECADRON) 12 mg in sodium chloride 0.9 % 145 mL IVPB, , Intravenous,  Once, 4 of 4 cycles Administration:  (08/08/2017),  (08/22/2017),  (09/05/2017),  (09/19/2017)  for chemotherapy treatment.      ALLERGIES:  is allergic to sulfa antibiotics.  MEDICATIONS:  Current Outpatient Medications  Medication Sig Dispense Refill   acetaminophen (TYLENOL) 500 MG tablet Take 1,000 mg by mouth every 6 (six) hours as needed for moderate pain or headache.      ALPRAZolam (XANAX) 0.5 MG tablet Take 1 tablet (0.5 mg total) by mouth 2 (two) times daily. (Patient taking differently: Take 0.5 mg by mouth 2 (two) times daily. Taking once/day) 60 tablet 1   amLODipine (NORVASC) 5 MG tablet Take 1 tablet (5 mg total) by mouth daily. 90 tablet 3   aspirin EC 81 MG tablet Take 81 mg by mouth at bedtime.      calcium carbonate (OS-CAL - DOSED IN MG OF ELEMENTAL CALCIUM) 1250 (500 Ca) MG tablet Take 1 tablet by mouth daily with breakfast.     chlorhexidine (PERIDEX) 0.12 % solution SMARTSIG:By Mouth     Cholecalciferol (VITAMIN D3) 125 MCG (5000 UT) CAPS Take 1 capsule by mouth 2 (two) times a day.     docusate sodium (COLACE) 100 MG capsule Take 100 mg by mouth 3 (three) times daily as  needed for mild constipation.     Erythromycin 500 MG TBEC Take 1 tablet by mouth 3 (three) times daily.     hydrochlorothiazide (HYDRODIURIL) 12.5 MG tablet Take 1 tablet (12.5 mg total) by mouth daily. 90 tablet 3   levothyroxine (SYNTHROID) 100 MCG tablet TAKE 1 TABLET BY MOUTH DAILY 90 tablet 3   nystatin-lidocaine-prednisoLONE-diphenhydrAMINE-distilled water-alum & mag hydroxide-simeth Swish and spit 5-140mfour times a day as needed 480 mL 3   olopatadine (PATANOL) 0.1 % ophthalmic solution Place 1 drop into the right eye 2 (two) times daily. 5 mL 12   palbociclib (IBRANCE) 125 MG tablet Take 1 tablet (125 mg total) by mouth daily. Take for 21 days, then hold for 7 days. Repeat every 28 days. 21 tablet 3   potassium chloride SA (KLOR-CON M) 20 MEQ tablet TAKE 1 TABLET BY MOUTH TWICE DAILY 60 tablet 3   PREVIDENT 5000 PLUS 1.1 % CREA dental cream SMARTSIG:Application By Mouth     Probiotic Product (ALIGN PO) Take by mouth.     rosuvastatin (CRESTOR) 5 MG tablet TAKE 1 TABLET BY MOUTH DAILY 30 tablet 6   traMADol (ULTRAM) 50 MG tablet  Take 1 tablet (50 mg total) by mouth every 6 (six) hours as needed. 60 tablet 0   traZODone (DESYREL) 50 MG tablet TAKE 1 TABLET BY MOUTH DAILY 30 tablet 6   triamcinolone cream (KENALOG) 0.1 % Apply 1 application. topically 2 (two) times daily. 30 g 0   No current facility-administered medications for this visit.    VITAL SIGNS: There were no vitals taken for this visit. There were no vitals filed for this visit.   Estimated body mass index is 34.7 kg/m as calculated from the following:   Height as of 07/21/21: _0  (1.676 m).   Weight as of an earlier encounter on 08/10/21: 97.5 kg (215 lb).  LABS: CBC:    Component Value Date/Time   WBC 1.3 (LL) 08/10/2021 0951   HGB 9.9 (L) 08/10/2021 0951   HGB 14.2 02/11/2011 1349   HCT 28.9 (L) 08/10/2021 0951   HCT 40.9 02/11/2011 1349   PLT 88 (L) 08/10/2021 0951   PLT 151 02/11/2011 1349   MCV 103.2 (H)  08/10/2021 0951   MCV 89 02/11/2011 1349   NEUTROABS 0.8 (L) 08/10/2021 0951   LYMPHSABS 0.3 (L) 08/10/2021 0951   MONOABS 0.2 08/10/2021 0951   EOSABS 0.0 08/10/2021 0951   BASOSABS 0.0 08/10/2021 0951   Comprehensive Metabolic Panel:    Component Value Date/Time   NA 139 08/10/2021 0951   NA 142 02/11/2011 1349   K 4.2 08/10/2021 0951   K 3.8 02/11/2011 1349   CL 105 08/10/2021 0951   CL 107 02/11/2011 1349   CO2 27 08/10/2021 0951   CO2 24 02/11/2011 1349   BUN 20 08/10/2021 0951   BUN 14 02/11/2011 1349   CREATININE 1.38 (H) 08/10/2021 0951   CREATININE 0.65 02/11/2011 1349   GLUCOSE 98 08/10/2021 0951   GLUCOSE 98 02/11/2011 1349   CALCIUM 9.5 08/10/2021 0951   CALCIUM 9.0 02/11/2011 1349   AST 33 08/10/2021 0951   AST 27 02/11/2011 1349   ALT 25 08/10/2021 0951   ALT 36 02/11/2011 1349   ALKPHOS 81 08/10/2021 0951   ALKPHOS 122 02/11/2011 1349   BILITOT 0.5 08/10/2021 0951   BILITOT 0.5 02/11/2011 1349   PROT 7.2 08/10/2021 0951   PROT 7.0 02/11/2011 1349   ALBUMIN 4.0 08/10/2021 0951   ALBUMIN 4.1 02/11/2011 1349    RADIOGRAPHIC STUDIES: No results found.   Assessment and Plan-  Reviewed CBC and CMP, ANC 0.8, patient instructed to hold her palbociclib She is finishing off an abx course from her dental surgeon (Dr. Romilda Garret). She will see him on 08/15/21 for following.  Per Ms. Ducksworth, the plan to extract two of her teeth is on hold due to concern that she will develop a similar jaw issue after their removal Continue to hold Xgeva, not yet cleared by dentist to resume treatment.   Oral Chemotherapy Adherence: No reported missed doses  New medications: none, reported  Medication Access Issues: No issues, fills her Ibrance at [place].  Patient expressed understanding and was in agreement with this plan. She also understands that She can call clinic at any time with any questions, concerns, or complaints.   Follow-up plan: RTC in 1 weeks for  repeat labs/pharm clinic/faslodex  Thank you for allowing me to participate in the care of this very pleasant patient.   Time Total: 15 mins  Visit consisted of counseling and education on dealing with issues of symptom management in the setting of serious and potentially life-threatening  illness.Greater than 50%  of this time was spent counseling and coordinating care related to the above assessment and plan.  Signed by: Darl Pikes, PharmD, BCPS, Salley Slaughter, CPP Hematology/Oncology Clinical Pharmacist Practitioner [facility]/HP/AP [facility] [phone]  08/10/2021 3:58 PM

## 2021-08-11 ENCOUNTER — Ambulatory Visit
Admission: RE | Admit: 2021-08-11 | Discharge: 2021-08-11 | Disposition: A | Discharge status: Home / Self Care | Payer: Medicare Other | Source: Ambulatory Visit | Attending: Radiation Oncology | Admitting: Radiation Oncology

## 2021-08-11 ENCOUNTER — Encounter: Payer: Self-pay | Admitting: Oncology

## 2021-08-11 ENCOUNTER — Other Ambulatory Visit: Payer: Self-pay

## 2021-08-11 DIAGNOSIS — C50912 Malignant neoplasm of unspecified site of left female breast: Secondary | ICD-10-CM | POA: Diagnosis not present

## 2021-08-11 DIAGNOSIS — C7951 Secondary malignant neoplasm of bone: Secondary | ICD-10-CM | POA: Diagnosis not present

## 2021-08-11 DIAGNOSIS — Z170 Estrogen receptor positive status [ER+]: Secondary | ICD-10-CM | POA: Diagnosis not present

## 2021-08-11 LAB — RAD ONC ARIA SESSION SUMMARY
Course Elapsed Days: 10
Plan Fractions Treated to Date: 9
Plan Prescribed Dose Per Fraction: 3 Gy
Plan Total Fractions Prescribed: 10
Plan Total Prescribed Dose: 30 Gy
Reference Point Dosage Given to Date: 27 Gy
Reference Point Session Dosage Given: 3 Gy
Session Number: 9

## 2021-08-11 LAB — CANCER ANTIGEN 27.29: CA 27.29: 87.1 U/mL — ABNORMAL HIGH (ref 0.0–38.6)

## 2021-08-14 ENCOUNTER — Ambulatory Visit
Admission: RE | Admit: 2021-08-14 | Discharge: 2021-08-14 | Disposition: A | Discharge status: Home / Self Care | Payer: Medicare Other | Source: Ambulatory Visit | Attending: Radiation Oncology | Admitting: Radiation Oncology

## 2021-08-14 ENCOUNTER — Other Ambulatory Visit: Payer: Self-pay

## 2021-08-14 DIAGNOSIS — C50912 Malignant neoplasm of unspecified site of left female breast: Secondary | ICD-10-CM | POA: Diagnosis not present

## 2021-08-14 DIAGNOSIS — Z170 Estrogen receptor positive status [ER+]: Secondary | ICD-10-CM | POA: Diagnosis not present

## 2021-08-14 DIAGNOSIS — C7951 Secondary malignant neoplasm of bone: Secondary | ICD-10-CM | POA: Diagnosis not present

## 2021-08-14 LAB — RAD ONC ARIA SESSION SUMMARY
Course Elapsed Days: 13
Plan Fractions Treated to Date: 10
Plan Prescribed Dose Per Fraction: 3 Gy
Plan Total Fractions Prescribed: 10
Plan Total Prescribed Dose: 30 Gy
Reference Point Dosage Given to Date: 30 Gy
Reference Point Session Dosage Given: 3 Gy
Session Number: 10

## 2021-08-17 ENCOUNTER — Inpatient Hospital Stay: Payer: Medicare Other

## 2021-08-17 ENCOUNTER — Inpatient Hospital Stay: Payer: Medicare Other | Admitting: Pharmacist

## 2021-08-17 ENCOUNTER — Inpatient Hospital Stay: Payer: Medicare Other | Attending: Oncology

## 2021-08-17 DIAGNOSIS — C50919 Malignant neoplasm of unspecified site of unspecified female breast: Secondary | ICD-10-CM

## 2021-08-17 DIAGNOSIS — C50912 Malignant neoplasm of unspecified site of left female breast: Secondary | ICD-10-CM

## 2021-08-17 DIAGNOSIS — Z5111 Encounter for antineoplastic chemotherapy: Secondary | ICD-10-CM | POA: Insufficient documentation

## 2021-08-17 DIAGNOSIS — Z79899 Other long term (current) drug therapy: Secondary | ICD-10-CM | POA: Diagnosis not present

## 2021-08-17 DIAGNOSIS — C7951 Secondary malignant neoplasm of bone: Secondary | ICD-10-CM | POA: Diagnosis not present

## 2021-08-17 LAB — COMPREHENSIVE METABOLIC PANEL
ALT: 20 U/L (ref 0–44)
AST: 29 U/L (ref 15–41)
Albumin: 4.3 g/dL (ref 3.5–5.0)
Alkaline Phosphatase: 87 U/L (ref 38–126)
Anion gap: 8 (ref 5–15)
BUN: 21 mg/dL (ref 8–23)
CO2: 26 mmol/L (ref 22–32)
Calcium: 9.3 mg/dL (ref 8.9–10.3)
Chloride: 100 mmol/L (ref 98–111)
Creatinine, Ser: 1.19 mg/dL — ABNORMAL HIGH (ref 0.44–1.00)
GFR, Estimated: 49 mL/min — ABNORMAL LOW (ref >60)
Glucose, Bld: 98 mg/dL (ref 70–99)
Potassium: 3.8 mmol/L (ref 3.5–5.1)
Sodium: 134 mmol/L — ABNORMAL LOW (ref 135–145)
Total Bilirubin: 0.7 mg/dL (ref 0.3–1.2)
Total Protein: 7.8 g/dL (ref 6.5–8.1)

## 2021-08-17 LAB — CBC WITH DIFFERENTIAL/PLATELET
Abs Immature Granulocytes: 0.04 10*3/uL (ref 0.00–0.07)
Basophils Absolute: 0.1 10*3/uL (ref 0.0–0.1)
Basophils Relative: 2 %
Eosinophils Absolute: 0 10*3/uL (ref 0.0–0.5)
Eosinophils Relative: 1 %
HCT: 31 % — ABNORMAL LOW (ref 36.0–46.0)
Hemoglobin: 10.4 g/dL — ABNORMAL LOW (ref 12.0–15.0)
Immature Granulocytes: 2 %
Lymphocytes Relative: 17 %
Lymphs Abs: 0.4 10*3/uL — ABNORMAL LOW (ref 0.7–4.0)
MCH: 34.6 pg — ABNORMAL HIGH (ref 26.0–34.0)
MCHC: 33.5 g/dL (ref 30.0–36.0)
MCV: 103 fL — ABNORMAL HIGH (ref 80.0–100.0)
Monocytes Absolute: 0.8 10*3/uL (ref 0.1–1.0)
Monocytes Relative: 37 %
Neutro Abs: 0.9 10*3/uL — ABNORMAL LOW (ref 1.7–7.7)
Neutrophils Relative %: 41 %
Platelets: 150 10*3/uL (ref 150–400)
RBC: 3.01 MIL/uL — ABNORMAL LOW (ref 3.87–5.11)
RDW: 16.2 % — ABNORMAL HIGH (ref 11.5–15.5)
WBC: 2.2 10*3/uL — ABNORMAL LOW (ref 4.0–10.5)
nRBC: 1.8 % — ABNORMAL HIGH (ref 0.0–0.2)

## 2021-08-17 MED ORDER — FULVESTRANT 250 MG/5ML IM SOSY
500.0000 mg | PREFILLED_SYRINGE | Freq: Once | INTRAMUSCULAR | Status: AC
  Administered 2021-08-17: 500 mg via INTRAMUSCULAR
  Filled 2021-08-17: qty 10

## 2021-08-17 NOTE — Progress Notes (Signed)
[facility] [place]  [phone] [phone]    I connected with Ms. Echeverria on 08/17/21 at  1:30 PM EDT by telephone and verified that I am speaking with the correct person using two identifiers.  Name of the patient: Alicia [name]  [id number]  Oct 10, 1950   Location: Patient: home Provider: in office   I discussed the limitations, risks, security and privacy concerns of performing an evaluation and management service by telephone and the availability of in person appointments. I also discussed with the patient that there may be a patient responsible charge related to this service. The patient expressed understanding and agreed to proceed.  HPI: Patient is a 71 y.o. female with recurrent stage IV ER/PR positive, HER2 negative breast cancer. Patient started on Ibrance on 07/09/19.   Reason for Consult: Oral chemotherapy follow-up for palbociclib therapy.   PAST MEDICAL HISTORY: Past Medical History:  Diagnosis Date   Anxiety    Breast cancer, left ([place]) 07/2017   Depression    Hypertension    Hypothyroidism    Personal history of chemotherapy 2019   LEFT mastectomy-chemo before   Personal history of radiation therapy 03/2018   LEFT mastectomy   Rapid heart rate    Thyroid disease     HEMATOLOGY/ONCOLOGY HISTORY:  Oncology History Overview Note  Patient is a 71 year old female who recently self palpated a mass on her left breast.  Subsequent imaging and biopsy revealed the above-stated breast cancer.  Case was also discussed extensively at case conference.  Given the size and the stage of patient's malignancy, she will benefit from neoadjuvant chemotherapy using Adriamycin, Cytoxan, and Taxol.  Patient will also require Neulasta support.  Will get CT scan of the chest, abdomen, and pelvis to assess for any metastatic disease.  Patient will also require port placement and MUGA prior to initiating  treatment  CT abdomen/pelvis/chest did not reveal any suspicious lesions concerning for metastatic disease. (08/01/17)  Port-A-Cath placed on 08/07/2017.  Cycle 1 day 1 of AC was given on 08/08/17.     Breast cancer, stage 2, left ([place])  07/24/2017 Initial Diagnosis   Breast cancer, stage 2, left ([place])   07/30/2017 Cancer Staging   Staging form: Breast, AJCC 8th Edition - Clinical stage from 07/30/2017: Stage IIA (cT2, cN1, cM0, G2, ER+, PR+, HER2-) - Signed by Lloyd Huger, MD on 07/30/2017   08/08/2017 - 10/31/2017 Chemotherapy   The patient had DOXOrubicin (ADRIAMYCIN) chemo injection 130 mg, 60 mg/m2 = 130 mg, Intravenous,  Once, 4 of 4 cycles Administration: 130 mg (08/08/2017), 130 mg (08/22/2017), 130 mg (09/05/2017), 130 mg (09/19/2017) palonosetron (ALOXI) injection 0.25 mg, 0.25 mg, Intravenous,  Once, 4 of 4 cycles Administration: 0.25 mg (08/08/2017), 0.25 mg (08/22/2017), 0.25 mg (09/05/2017), 0.25 mg (09/19/2017) pegfilgrastim-cbqv (UDENYCA) injection 6 mg, 6 mg, Subcutaneous, Once, 4 of 4 cycles Administration: 6 mg (08/09/2017), 6 mg (08/23/2017), 6 mg (09/06/2017), 6 mg (09/20/2017) cyclophosphamide (CYTOXAN) 1,300 mg in sodium chloride 0.9 % 250 mL chemo infusion, 600 mg/m2 = 1,300 mg, Intravenous,  Once, 4 of 4 cycles Administration: 1,300 mg (08/08/2017), 1,300 mg (08/22/2017), 1,300 mg (09/05/2017), 1,300 mg (09/19/2017) PACLitaxel (TAXOL) 174 mg in sodium chloride 0.9 % 250 mL chemo infusion (</= $RemoveBefor'80mg'kvflHKletZRd$ /m2), 80 mg/m2 = 174 mg, Intravenous,  Once, 4 of 12 cycles Dose modification: 72 mg/m2 (original dose 80 mg/m2, Cycle 6, Reason: Dose not tolerated) Administration: 174 mg (10/03/2017), 156 mg (10/17/2017), 156 mg (10/24/2017), 156 mg (10/31/2017) fosaprepitant (  EMEND) 150 mg, dexamethasone (DECADRON) 12 mg in sodium chloride 0.9 % 145 mL IVPB, , Intravenous,  Once, 4 of 4 cycles Administration:  (08/08/2017),  (08/22/2017),  (09/05/2017),  (09/19/2017)  for chemotherapy treatment.       ALLERGIES:  is allergic to sulfa antibiotics.  MEDICATIONS:  Current Outpatient Medications  Medication Sig Dispense Refill   acetaminophen (TYLENOL) 500 MG tablet Take 1,000 mg by mouth every 6 (six) hours as needed for moderate pain or headache.      ALPRAZolam (XANAX) 0.5 MG tablet Take 1 tablet (0.5 mg total) by mouth 2 (two) times daily. (Patient taking differently: Take 0.5 mg by mouth 2 (two) times daily. Taking once/day) 60 tablet 1   amLODipine (NORVASC) 5 MG tablet Take 1 tablet (5 mg total) by mouth daily. 90 tablet 3   aspirin EC 81 MG tablet Take 81 mg by mouth at bedtime.      calcium carbonate (OS-CAL - DOSED IN MG OF ELEMENTAL CALCIUM) 1250 (500 Ca) MG tablet Take 1 tablet by mouth daily with breakfast.     chlorhexidine (PERIDEX) 0.12 % solution SMARTSIG:By Mouth     Cholecalciferol (VITAMIN D3) 125 MCG (5000 UT) CAPS Take 1 capsule by mouth 2 (two) times a day.     docusate sodium (COLACE) 100 MG capsule Take 100 mg by mouth 3 (three) times daily as needed for mild constipation.     Erythromycin 500 MG TBEC Take 1 tablet by mouth 3 (three) times daily.     hydrochlorothiazide (HYDRODIURIL) 12.5 MG tablet Take 1 tablet (12.5 mg total) by mouth daily. 90 tablet 3   levothyroxine (SYNTHROID) 100 MCG tablet TAKE 1 TABLET BY MOUTH DAILY 90 tablet 3   nystatin-lidocaine-prednisoLONE-diphenhydrAMINE-distilled water-alum & mag hydroxide-simeth Swish and spit 5-72ml four times a day as needed 480 mL 3   olopatadine (PATANOL) 0.1 % ophthalmic solution Place 1 drop into the right eye 2 (two) times daily. 5 mL 12   palbociclib (IBRANCE) 125 MG tablet Take 1 tablet (125 mg total) by mouth daily. Take for 21 days, then hold for 7 days. Repeat every 28 days. 21 tablet 3   potassium chloride SA (KLOR-CON M) 20 MEQ tablet TAKE 1 TABLET BY MOUTH TWICE DAILY 60 tablet 3   PREVIDENT 5000 PLUS 1.1 % CREA dental cream SMARTSIG:Application By Mouth     Probiotic Product (ALIGN PO) Take by  mouth.     rosuvastatin (CRESTOR) 5 MG tablet TAKE 1 TABLET BY MOUTH DAILY 30 tablet 6   traMADol (ULTRAM) 50 MG tablet Take 1 tablet (50 mg total) by mouth every 6 (six) hours as needed. 60 tablet 0   traZODone (DESYREL) 50 MG tablet TAKE 1 TABLET BY MOUTH DAILY 30 tablet 6   triamcinolone cream (KENALOG) 0.1 % Apply 1 application. topically 2 (two) times daily. 30 g 0   No current facility-administered medications for this visit.    VITAL SIGNS: There were no vitals taken for this visit. There were no vitals filed for this visit.  Estimated body mass index is 34.7 kg/m as calculated from the following:   Height as of 07/21/21: $RemoveBe'5\' 6"'wvDagfHpK$  (1.676 m).   Weight as of 08/10/21: 97.5 kg (215 lb).  LABS: CBC:    Component Value Date/Time   WBC 2.2 (L) 08/17/2021 1122   HGB 10.4 (L) 08/17/2021 1122   HGB 14.2 02/11/2011 1349   HCT 31.0 (L) 08/17/2021 1122   HCT 40.9 02/11/2011 1349   PLT 150 08/17/2021 1122  PLT 151 02/11/2011 1349   MCV 103.0 (H) 08/17/2021 1122   MCV 89 02/11/2011 1349   NEUTROABS 0.9 (L) 08/17/2021 1122   LYMPHSABS 0.4 (L) 08/17/2021 1122   MONOABS 0.8 08/17/2021 1122   EOSABS 0.0 08/17/2021 1122   BASOSABS 0.1 08/17/2021 1122   Comprehensive Metabolic Panel:    Component Value Date/Time   NA 134 (L) 08/17/2021 1122   NA 142 02/11/2011 1349   K 3.8 08/17/2021 1122   K 3.8 02/11/2011 1349   CL 100 08/17/2021 1122   CL 107 02/11/2011 1349   CO2 26 08/17/2021 1122   CO2 24 02/11/2011 1349   BUN 21 08/17/2021 1122   BUN 14 02/11/2011 1349   CREATININE 1.19 (H) 08/17/2021 1122   CREATININE 0.65 02/11/2011 1349   GLUCOSE 98 08/17/2021 1122   GLUCOSE 98 02/11/2011 1349   CALCIUM 9.3 08/17/2021 1122   CALCIUM 9.0 02/11/2011 1349   AST 29 08/17/2021 1122   AST 27 02/11/2011 1349   ALT 20 08/17/2021 1122   ALT 36 02/11/2011 1349   ALKPHOS 87 08/17/2021 1122   ALKPHOS 122 02/11/2011 1349   BILITOT 0.7 08/17/2021 1122   BILITOT 0.5 02/11/2011 1349   PROT 7.8  08/17/2021 1122   PROT 7.0 02/11/2011 1349   ALBUMIN 4.3 08/17/2021 1122   ALBUMIN 4.1 02/11/2011 1349    On phone during today's visit: patient only  Assessment and Plan-  Reviewed CBC and CMP, ANC has improved to 0.9 and she is feeling overall better Patient will resume palbociclib 11m 21on/7off Patient received her fulvestrant today when she came in for labs Continue to hold Xgeva, not yet cleared by dentist to resume treatment   Other:  Patient's dental appt this week was moved to 08/25/21. A copy of her labs from today were placed in the mail to her today so that she is able to bring them with her to her dental appt.  Medication Access Issues: No issues, she fills at [facility] Patient expressed understanding and was in agreement with this plan. She also understands that She can call clinic at any time with any questions, concerns, or complaints.   Follow-up plan: RTC in 4 weeks  Thank you for allowing me to participate in the care of this very pleasant patient.   Time Total: 10 mins of non face-to-face telephone visit time during this encounter  Visit consisted of counseling and education on dealing with issues of symptom management in the setting of serious and potentially life-threatening illness.Greater than 50%  of this time was spent counseling and coordinating care related to the above assessment and plan.   ADarl Pikes PharmD, BCPS, BCOP, CPP Hematology/Oncology Clinical Pharmacist Practitioner [phone] 08/17/2021 4:34 PM

## 2021-08-18 LAB — CANCER ANTIGEN 27.29: CA 27.29: 84.4 U/mL — ABNORMAL HIGH (ref 0.0–38.6)

## 2021-08-22 ENCOUNTER — Other Ambulatory Visit: Payer: Self-pay | Admitting: *Deleted

## 2021-08-22 DIAGNOSIS — F419 Anxiety disorder, unspecified: Secondary | ICD-10-CM

## 2021-08-22 MED ORDER — ALPRAZOLAM 0.5 MG PO TABS: 0.5000 mg | ORAL_TABLET | Freq: Two times a day (BID) | ORAL | 0 refills | Status: DC

## 2021-08-22 MED ORDER — ROSUVASTATIN CALCIUM 5 MG PO TABS: 5.0000 mg | ORAL_TABLET | Freq: Every day | ORAL | 3 refills | Status: DC

## 2021-08-28 ENCOUNTER — Other Ambulatory Visit (HOSPITAL_COMMUNITY): Payer: Self-pay

## 2021-09-04 ENCOUNTER — Other Ambulatory Visit (HOSPITAL_COMMUNITY): Payer: Self-pay

## 2021-09-11 ENCOUNTER — Ambulatory Visit: Payer: Medicare Other | Admitting: Radiation Oncology

## 2021-09-11 ENCOUNTER — Ambulatory Visit
Admission: RE | Admit: 2021-09-11 | Discharge: 2021-09-11 | Disposition: A | Discharge status: Home / Self Care | Payer: Medicare Other | Source: Ambulatory Visit | Attending: Oncology | Admitting: Oncology

## 2021-09-11 DIAGNOSIS — Z853 Personal history of malignant neoplasm of breast: Secondary | ICD-10-CM | POA: Diagnosis not present

## 2021-09-11 DIAGNOSIS — Z1231 Encounter for screening mammogram for malignant neoplasm of breast: Secondary | ICD-10-CM | POA: Insufficient documentation

## 2021-09-11 DIAGNOSIS — C50919 Malignant neoplasm of unspecified site of unspecified female breast: Secondary | ICD-10-CM

## 2021-09-13 NOTE — Progress Notes (Signed)
[place]  [phone] Fax:(336) [phone]  ID: Alicia [name] OB: 10-03-1950  MR#: [id number]  NKN#:[id number]  Patient Care Team: [name], [name], Harrell Gave, [name] Rico Junker, RN as Registered Nurse Lloyd Huger, MD as Consulting Physician (Oncology) Bary Castilla Forest Gleason, MD as Consulting Physician (General Surgery) Bary Castilla Forest Gleason, MD as Consulting Physician (General Surgery) Noreene Filbert, MD as Referring Physician (Radiation Oncology) Jeral Fruit, RN as Registered Nurse   CHIEF COMPLAINT: Recurrent stage IV ER/PR positive, 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  INTERVAL HISTORY: Patient returns to clinic today for further evaluation and continuation of Ibrance and Faslodex.  She continues to have jaw pain and is currently on antibiotics, but states this is improving.  She is followed closely by an oral surgeon.  She continues to have mild flank pain.  She does not complain of weakness or fatigue today.  She continues to have a mild peripheral neuropathy, but no other neurologic complaints.  She denies any chest pain, shortness of breath, cough, or hemoptysis.  She denies any nausea, vomiting, constipation, or diarrhea.  She has no urinary complaints.  Patient offers no further specific complaints today.  REVIEW OF SYSTEMS:   Review of Systems  Constitutional: Negative.  Negative for fever, malaise/fatigue and weight loss.  HENT:  Negative for congestion.   Respiratory: Negative.  Negative for cough and shortness of breath.   Cardiovascular: Negative.  Negative for chest pain and leg swelling.  Gastrointestinal: Negative.  Negative for abdominal pain, constipation, diarrhea and nausea.  Genitourinary:  Positive for flank pain. Negative for dysuria and urgency.  Musculoskeletal:  Negative for back pain and joint pain.  Skin: Negative.   Negative for rash.  Neurological:  Positive for tingling and sensory change. Negative for dizziness, focal weakness, weakness and headaches.  Psychiatric/Behavioral:  The patient is not nervous/anxious.     As per HPI. Otherwise, a complete review of systems is negative.  PAST MEDICAL HISTORY: Past Medical History:  Diagnosis Date   Anxiety    Breast cancer, left ([place]) 07/2017   Depression    Hypertension    Hypothyroidism    Personal history of chemotherapy 2019   LEFT mastectomy-chemo before   Personal history of radiation therapy 03/2018   LEFT mastectomy   Rapid heart rate    Thyroid disease     PAST SURGICAL HISTORY: Past Surgical History:  Procedure Laterality Date   AXILLARY LYMPH NODE BIOPSY Left 07/16/2017   METASTATIC MAMMARY CARCINOMA   BREAST BIOPSY Left 07/16/2017   [place] bx of left breast mass and left breast LN.  INVASIVE MAMMARY CARCINOMA, NO SPECIAL TYPE.    BREAST EXCISIONAL BIOPSY Right 2001   benign   BREAST LUMPECTOMY WITH SENTINEL LYMPH NODE BIOPSY Left 12/06/2017   Procedure: BREAST LUMPECTOMY WITH SENTINEL LYMPH NODE BX;  Surgeon: Robert Bellow, MD;  Location: [facility] ORS;  Service: General;  Laterality: Left;   COLONOSCOPY     MASTECTOMY Left 12/23/2017   PORTACATH PLACEMENT Right 08/07/2017   Procedure: INSERTION PORT-A-CATH;  Surgeon: Robert Bellow, MD;  Location: [facility] OR[facility];  Service: General;  Laterality: Right;   SIMPLE MASTECTOMY WITH AXILLARY SENTINEL NODE BIOPSY Left 12/23/2017   T2,N2 with 6/7 nodes positive. Whole breast radiation.  Surgeon: Robert Bellow, MD;  Location: [facility] OR[facility];  Service: General;  Laterality: Left;   TONSILLECTOMY      FAMILY HISTORY: Family History  Problem Relation Age of Onset   Stroke Mother    Thyroid disease Mother    Renal Disease Mother    Stroke Father    Heart attack Father    Sudden death Father 51       suicide   Anuerysm Brother    Breast cancer Neg Hx     ADVANCED DIRECTIVES (Y/N):   N  HEALTH MAINTENANCE: Social History   Tobacco Use   Smoking status: Former    Packs/day: 1.00    Years: 30.00    Total pack years: 30.00    Types: Cigarettes    Quit date: 2019    Years since quitting: 4.6   Smokeless tobacco: Never  Vaping Use   Vaping Use: Never used  Substance Use Topics   Alcohol use: Not Currently   Drug use: Never     Colonoscopy:  PAP:  Bone density:  Lipid panel:  Allergies  Allergen Reactions   Sulfa Antibiotics Diarrhea    Current Outpatient Medications  Medication Sig Dispense Refill   acetaminophen (TYLENOL) 500 MG tablet Take 1,000 mg by mouth every 6 (six) hours as needed for moderate pain or headache.      ALPRAZolam (XANAX) 0.5 MG tablet Take 1 tablet (0.5 mg total) by mouth 2 (two) times daily. Taking once/day 60 tablet 0   amLODipine (NORVASC) 5 MG tablet Take 1 tablet (5 mg total) by mouth daily. 90 tablet 3   aspirin EC 81 MG tablet Take 81 mg by mouth at bedtime.      calcium carbonate (OS-CAL - DOSED IN MG OF ELEMENTAL CALCIUM) 1250 (500 Ca) MG tablet Take 1 tablet by mouth daily with breakfast.     chlorhexidine (PERIDEX) 0.12 % solution SMARTSIG:By Mouth     Cholecalciferol (VITAMIN D3) 125 MCG (5000 UT) CAPS Take 1 capsule by mouth 2 (two) times a day.     docusate sodium (COLACE) 100 MG capsule Take 100 mg by mouth 3 (three) times daily as needed for mild constipation.     Erythromycin 500 MG TBEC Take 1 tablet by mouth 3 (three) times daily.     hydrochlorothiazide (HYDRODIURIL) 12.5 MG tablet Take 1 tablet (12.5 mg total) by mouth daily. 90 tablet 3   levothyroxine (SYNTHROID) 100 MCG tablet TAKE 1 TABLET BY MOUTH DAILY 90 tablet 3   nystatin-lidocaine-prednisoLONE-diphenhydrAMINE-distilled water-alum & mag hydroxide-simeth Swish and spit 5-42m four times a day as needed 480 mL 3   olopatadine (PATANOL) 0.1 % ophthalmic solution Place 1 drop into the right eye 2 (two) times daily. 5 mL 12   palbociclib (IBRANCE) 125 MG  tablet Take 1 tablet (125 mg total) by mouth daily. Take for 21 days, then hold for 7 days. Repeat every 28 days. 21 tablet 3   potassium chloride SA (KLOR-CON M) 20 MEQ tablet TAKE 1 TABLET BY MOUTH TWICE DAILY 60 tablet 3   PREVIDENT 5000 PLUS 1.1 % CREA dental cream SMARTSIG:Application By Mouth     Probiotic Product (ALIGN PO) Take by mouth.     rosuvastatin (CRESTOR) 5 MG tablet Take 1 tablet (5 mg total) by mouth daily. 90 tablet 3   traMADol (ULTRAM) 50 MG tablet Take 1 tablet (50 mg total) by mouth every 6 (six) hours as needed. 60 tablet 0   traZODone (DESYREL) 50 MG tablet TAKE 1 TABLET BY MOUTH DAILY 30 tablet 6   triamcinolone cream (KENALOG) 0.1 % Apply 1 application. topically 2 (two) times daily. 30 g 0  No current facility-administered medications for this visit.    OBJECTIVE: Vitals:   09/14/21 0945  BP: 124/61  Pulse: 71  Temp: 97.7 F (36.5 C)     Body mass index is 35.35 kg/m.    ECOG FS:0 - Asymptomatic  General: Well-developed, well-nourished, no acute distress. Eyes: Pink conjunctiva, anicteric sclera. HEENT: Normocephalic, moist mucous membranes. Lungs: No audible wheezing or coughing. Heart: Regular rate and rhythm. Abdomen: Soft, nontender, no obvious distention. Musculoskeletal: No edema, cyanosis, or clubbing. Neuro: Alert, answering all questions appropriately. Cranial nerves grossly intact. Skin: No rashes or petechiae noted. Psych: Normal affect.   LAB RESULTS:  Lab Results  Component Value Date   NA 136 09/14/2021   K 3.9 09/14/2021   CL 99 09/14/2021   CO2 26 09/14/2021   GLUCOSE 97 09/14/2021   BUN 27 (H) 09/14/2021   CREATININE 1.50 (H) 09/14/2021   CALCIUM 9.4 09/14/2021   PROT 7.5 09/14/2021   ALBUMIN 4.2 09/14/2021   AST 24 09/14/2021   ALT 18 09/14/2021   ALKPHOS 82 09/14/2021   BILITOT 0.7 09/14/2021   GFRNONAA 37 (L) 09/14/2021   GFRAA 57 (L) 10/01/2019    Lab Results  Component Value Date   WBC 1.7 (L) 09/14/2021    NEUTROABS 1.0 (L) 09/14/2021   HGB 10.1 (L) 09/14/2021   HCT 29.6 (L) 09/14/2021   MCV 101.4 (H) 09/14/2021   PLT 110 (L) 09/14/2021     STUDIES: MM 3D SCREEN BREAST UNI RIGHT  Result Date: 09/13/2021 CLINICAL DATA:  Screening. EXAM: DIGITAL SCREENING UNILATERAL RIGHT MAMMOGRAM WITH CAD AND TOMOSYNTHESIS TECHNIQUE: Right screening digital craniocaudal and mediolateral oblique mammograms were obtained. Right screening digital breast tomosynthesis was performed. The images were evaluated with computer-aided detection. COMPARISON:  Previous exam(s). ACR Breast Density Category c: The breast tissue is heterogeneously dense, which may obscure small masses. FINDINGS: There are no findings suspicious for malignancy. IMPRESSION: No mammographic evidence of malignancy. A result letter of this screening mammogram will be mailed directly to the patient. RECOMMENDATION: Screening mammogram in one year. (Code:SM-B-01Y) BI-RADS CATEGORY  1: Negative. Electronically Signed   By: Lovey Newcomer M.D.   On: 09/13/2021 08:41     ONCOLOGY HISTORY: Patient initially received neoadjuvant chemotherapy with Adriamycin and Cytoxan followed by weekly Taxol. She only received 4 cycles of weekly Taxol prior to discontinuation of treatment on October 31, 2017 secondary to persistent peripheral neuropathy.  She ultimately required mastectomy and final pathology noted 5 of 6 lymph nodes positive for disease.  Patient completed adjuvant XRT in mid April 2020.  Pain initiated letrozole in May 2020, this was discontinued secondary to side effects and patient was started on anastrozole.  Nuclear medicine bone scan on June 19, 2019 revealed metastatic disease and anastrozole was subsequently discontinued.  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  PLAN:    1. 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  MRI of the brain on August 24, 2019 reviewed independently with no obvious evidence of metastatic disease.  Repeat PET scan on March 02, 2021 revealed a hypermetabolic area in her left lateral iliac wing, but no other possible evidence of disease.  Hypermetabolism in her jaw is likely secondary to her ongoing dental issues.  Patient's CA 27-29 initially increased to 141.9, but appears to have plateaued since  November 2021 between 46.9 and 96.5.  Her most recent result is 84.4.  Patient continues to have persistent neutropenia and thrombocytopenia, but these are mildly improved.  Proceed with Ibrance and Faslodex today.  Continue to hold Xgeva at this time given her issues with her mandible. Patient will return to clinic in 4 weeks for further evaluation and continuation of treatment.  Appreciate clinical pharmacy input. Repeat PET scan prior to next clinic visit. 2.  Peripheral neuropathy: Chronic and unchanged. 3.  History of pulmonary embolus: Patient was diagnosed with a small pulmonary embolus on January 10, 2018.  She is no longer on anticoagulation.  There was no evidence of recurrent PE on CT scan from October 27, 2019. 4.  Osteopenia: Patient's most recent bone mineral density on March 03, 2019 reported a T score of -1.8 which is only mildly decreased from 1 year prior when the reported T score was -1.6.  Continue calcium and vitamin D supplementation.  Continue to hold Xgeva as above. 5.  Left hip/flank pain: Patient has now completed XRT. PET scan as above. Follow up with radiation oncology as scheduled.  Continue tramadol as needed. 6.  Leukopenia: Improved.  This has ranged between 1.6 and 3.5 since July 2021.  Today's result is 1.7.  7.  Anemia: Chronic and unchanged. Hemoglobin is 10.1 today.   8.  Renal insufficiency: Chronic and unchanged. Creatinine is 1.5 today.   9.   Hypokalemia: Resolved.  Continue oral supplementation as prescribed.   10.  UTI: Resolved. 11.  Dental pathology: Continue follow with Dr. Hampton Abbot. 12.  Thrombocytopenia: Platelets improved to 110. Proceed with Ibrance as above.  Patient expressed understanding and was in agreement with this plan. She also understands that She can call clinic at any time with any questions, concerns, or complaints.    Cancer Staging  Breast cancer, stage 2, left ([place]) Staging form: Breast, AJCC 8th Edition - Clinical stage from 07/30/2017: Stage IIA (cT2, cN1, cM0, G2, ER+, PR+, HER2-) - Signed by Lloyd Huger, MD on 07/30/2017 Histologic grading system: 3 grade system Laterality: Left   Lloyd Huger, MD   09/16/2021 7:16 AM

## 2021-09-14 ENCOUNTER — Inpatient Hospital Stay: Payer: Medicare Other

## 2021-09-14 ENCOUNTER — Other Ambulatory Visit (HOSPITAL_COMMUNITY): Payer: Self-pay

## 2021-09-14 ENCOUNTER — Inpatient Hospital Stay (HOSPITAL_BASED_OUTPATIENT_CLINIC_OR_DEPARTMENT_OTHER): Payer: Medicare Other | Admitting: Oncology

## 2021-09-14 ENCOUNTER — Inpatient Hospital Stay: Payer: Medicare Other | Admitting: Pharmacist

## 2021-09-14 ENCOUNTER — Encounter: Payer: Self-pay | Admitting: Oncology

## 2021-09-14 VITALS — BP 124/61 | HR 71 | Temp 97.7°F | Ht 66.0 in | Wt 219.0 lb

## 2021-09-14 DIAGNOSIS — C7951 Secondary malignant neoplasm of bone: Secondary | ICD-10-CM

## 2021-09-14 DIAGNOSIS — C50912 Malignant neoplasm of unspecified site of left female breast: Secondary | ICD-10-CM

## 2021-09-14 DIAGNOSIS — Z79899 Other long term (current) drug therapy: Secondary | ICD-10-CM | POA: Diagnosis not present

## 2021-09-14 DIAGNOSIS — Z5111 Encounter for antineoplastic chemotherapy: Secondary | ICD-10-CM | POA: Diagnosis not present

## 2021-09-14 LAB — COMPREHENSIVE METABOLIC PANEL
ALT: 18 U/L (ref 0–44)
AST: 24 U/L (ref 15–41)
Albumin: 4.2 g/dL (ref 3.5–5.0)
Alkaline Phosphatase: 82 U/L (ref 38–126)
Anion gap: 11 (ref 5–15)
BUN: 27 mg/dL — ABNORMAL HIGH (ref 8–23)
CO2: 26 mmol/L (ref 22–32)
Calcium: 9.4 mg/dL (ref 8.9–10.3)
Chloride: 99 mmol/L (ref 98–111)
Creatinine, Ser: 1.5 mg/dL — ABNORMAL HIGH (ref 0.44–1.00)
GFR, Estimated: 37 mL/min — ABNORMAL LOW (ref >60)
Glucose, Bld: 97 mg/dL (ref 70–99)
Potassium: 3.9 mmol/L (ref 3.5–5.1)
Sodium: 136 mmol/L (ref 135–145)
Total Bilirubin: 0.7 mg/dL (ref 0.3–1.2)
Total Protein: 7.5 g/dL (ref 6.5–8.1)

## 2021-09-14 LAB — CBC WITH DIFFERENTIAL/PLATELET
Abs Immature Granulocytes: 0.01 10*3/uL (ref 0.00–0.07)
Basophils Absolute: 0 10*3/uL (ref 0.0–0.1)
Basophils Relative: 1 %
Eosinophils Absolute: 0 10*3/uL (ref 0.0–0.5)
Eosinophils Relative: 1 %
HCT: 29.6 % — ABNORMAL LOW (ref 36.0–46.0)
Hemoglobin: 10.1 g/dL — ABNORMAL LOW (ref 12.0–15.0)
Immature Granulocytes: 1 %
Lymphocytes Relative: 20 %
Lymphs Abs: 0.3 10*3/uL — ABNORMAL LOW (ref 0.7–4.0)
MCH: 34.6 pg — ABNORMAL HIGH (ref 26.0–34.0)
MCHC: 34.1 g/dL (ref 30.0–36.0)
MCV: 101.4 fL — ABNORMAL HIGH (ref 80.0–100.0)
Monocytes Absolute: 0.3 10*3/uL (ref 0.1–1.0)
Monocytes Relative: 20 %
Neutro Abs: 1 10*3/uL — ABNORMAL LOW (ref 1.7–7.7)
Neutrophils Relative %: 57 %
Platelets: 110 10*3/uL — ABNORMAL LOW (ref 150–400)
RBC: 2.92 MIL/uL — ABNORMAL LOW (ref 3.87–5.11)
RDW: 16.1 % — ABNORMAL HIGH (ref 11.5–15.5)
WBC: 1.7 10*3/uL — ABNORMAL LOW (ref 4.0–10.5)
nRBC: 0 % (ref 0.0–0.2)

## 2021-09-14 MED ORDER — FULVESTRANT 250 MG/5ML IM SOSY
500.0000 mg | PREFILLED_SYRINGE | Freq: Once | INTRAMUSCULAR | Status: AC
  Administered 2021-09-14: 500 mg via INTRAMUSCULAR
  Filled 2021-09-14: qty 10

## 2021-09-15 NOTE — Progress Notes (Addendum)
[place]  [phone] Fax:(336) [phone]  Patient Care Team: [name], [name], Harrell Gave, [name] Rico Junker, RN as Registered Nurse Grayland Ormond Kathlene November, MD as Consulting Physician (Oncology) Bary Castilla Forest Gleason, MD as Consulting Physician (General Surgery) Bary Castilla Forest Gleason, MD as Consulting Physician (General Surgery) Noreene Filbert, MD as Referring Physician (Radiation Oncology) Jeral Fruit, RN as Registered Nurse   Name of the patient: Minna Dumire  [id number]  [birth year]   Date of visit: 09/14/21  HPI: Patient is a 71 y.o. female with recurrent stage IV ER/PR positive, HER2 negative breast cancer. Patient started on Ibrance on 07/09/19.   Reason for Consult: Oral chemotherapy follow-up for Ibrance (palbociclib) therapy.   PAST MEDICAL HISTORY: Past Medical History:  Diagnosis Date   Anxiety    Breast cancer, left ([place]) 07/2017   Depression    Hypertension    Hypothyroidism    Personal history of chemotherapy 2019   LEFT mastectomy-chemo before   Personal history of radiation therapy 03/2018   LEFT mastectomy   Rapid heart rate    Thyroid disease     PAST SURGICAL HISTORY:  Past Surgical History:  Procedure Laterality Date   AXILLARY LYMPH NODE BIOPSY Left 07/16/2017   METASTATIC MAMMARY CARCINOMA   BREAST BIOPSY Left 07/16/2017   [place] bx of left breast mass and left breast LN.  INVASIVE MAMMARY CARCINOMA, NO SPECIAL TYPE.    BREAST EXCISIONAL BIOPSY Right 2001   benign   BREAST LUMPECTOMY WITH SENTINEL LYMPH NODE BIOPSY Left 12/06/2017   Procedure: BREAST LUMPECTOMY WITH SENTINEL LYMPH NODE BX;  Surgeon: Robert Bellow, MD;  Location: [facility] OR[facility];  Service: General;  Laterality: Left;   COLONOSCOPY     MASTECTOMY Left 12/23/2017   PORTACATH PLACEMENT Right 08/07/2017   Procedure: INSERTION PORT-A-CATH;  Surgeon:  Robert Bellow, MD;  Location: [facility] OR[facility];  Service: General;  Laterality: Right;   SIMPLE MASTECTOMY WITH AXILLARY SENTINEL NODE BIOPSY Left 12/23/2017   T2,N2 with 6/7 nodes positive. Whole breast radiation.  Surgeon: Robert Bellow, MD;  Location: [facility] ORS;  Service: General;  Laterality: Left;   TONSILLECTOMY      HEMATOLOGY/ONCOLOGY HISTORY:  Oncology History Overview Note  Patient is a 71 year old female who recently self palpated a mass on her left breast.  Subsequent imaging and biopsy revealed the above-stated breast cancer.  Case was also discussed extensively at case conference.  Given the size and the stage of patient's malignancy, she will benefit from neoadjuvant chemotherapy using Adriamycin, Cytoxan, and Taxol.  Patient will also require Neulasta support.  Will get CT scan of the chest, abdomen, and pelvis to assess for any metastatic disease.  Patient will also require port placement and MUGA prior to initiating treatment  CT abdomen/pelvis/chest did not reveal any suspicious lesions concerning for metastatic disease. (08/01/17)  Port-A-Cath placed on 08/07/2017.  Cycle 1 day 1 of AC was given on 08/08/17.     Breast cancer, stage 2, left ([place])  07/24/2017 Initial Diagnosis   Breast cancer, stage 2, left ([place])   07/30/2017 Cancer Staging   Staging form: Breast, AJCC 8th Edition - Clinical stage from 07/30/2017: Stage IIA (cT2, cN1, cM0, G2, ER+, PR+, HER2-) - Signed by Lloyd Huger, MD on 07/30/2017   08/08/2017 - 10/31/2017 Chemotherapy   The patient had DOXOrubicin (ADRIAMYCIN) chemo injection 130 mg, 60 mg/m2 = 130 mg, Intravenous,  Once, 4  of 4 cycles Administration: 130 mg (08/08/2017), 130 mg (08/22/2017), 130 mg (09/05/2017), 130 mg (09/19/2017) palonosetron (ALOXI) injection 0.25 mg, 0.25 mg, Intravenous,  Once, 4 of 4 cycles Administration: 0.25 mg (08/08/2017), 0.25 mg (08/22/2017), 0.25 mg (09/05/2017), 0.25 mg (09/19/2017) pegfilgrastim-cbqv (UDENYCA) injection 6  mg, 6 mg, Subcutaneous, Once, 4 of 4 cycles Administration: 6 mg (08/09/2017), 6 mg (08/23/2017), 6 mg (09/06/2017), 6 mg (09/20/2017) cyclophosphamide (CYTOXAN) 1,300 mg in sodium chloride 0.9 % 250 mL chemo infusion, 600 mg/m2 = 1,300 mg, Intravenous,  Once, 4 of 4 cycles Administration: 1,300 mg (08/08/2017), 1,300 mg (08/22/2017), 1,300 mg (09/05/2017), 1,300 mg (09/19/2017) PACLitaxel (TAXOL) 174 mg in sodium chloride 0.9 % 250 mL chemo infusion (</= 15m/m2), 80 mg/m2 = 174 mg, Intravenous,  Once, 4 of 12 cycles Dose modification: 72 mg/m2 (original dose 80 mg/m2, Cycle 6, Reason: Dose not tolerated) Administration: 174 mg (10/03/2017), 156 mg (10/17/2017), 156 mg (10/24/2017), 156 mg (10/31/2017) fosaprepitant (EMEND) 150 mg, dexamethasone (DECADRON) 12 mg in sodium chloride 0.9 % 145 mL IVPB, , Intravenous,  Once, 4 of 4 cycles Administration:  (08/08/2017),  (08/22/2017),  (09/05/2017),  (09/19/2017)  for chemotherapy treatment.      ALLERGIES:  is allergic to sulfa antibiotics.  MEDICATIONS:  Current Outpatient Medications  Medication Sig Dispense Refill   acetaminophen (TYLENOL) 500 MG tablet Take 1,000 mg by mouth every 6 (six) hours as needed for moderate pain or headache.      ALPRAZolam (XANAX) 0.5 MG tablet Take 1 tablet (0.5 mg total) by mouth 2 (two) times daily. Taking once/day 60 tablet 0   amLODipine (NORVASC) 5 MG tablet Take 1 tablet (5 mg total) by mouth daily. 90 tablet 3   aspirin EC 81 MG tablet Take 81 mg by mouth at bedtime.      calcium carbonate (OS-CAL - DOSED IN MG OF ELEMENTAL CALCIUM) 1250 (500 Ca) MG tablet Take 1 tablet by mouth daily with breakfast.     chlorhexidine (PERIDEX) 0.12 % solution SMARTSIG:By Mouth     Cholecalciferol (VITAMIN D3) 125 MCG (5000 UT) CAPS Take 1 capsule by mouth 2 (two) times a day.     docusate sodium (COLACE) 100 MG capsule Take 100 mg by mouth 3 (three) times daily as needed for mild constipation.     Erythromycin 500 MG TBEC Take 1 tablet by  mouth 3 (three) times daily.     hydrochlorothiazide (HYDRODIURIL) 12.5 MG tablet Take 1 tablet (12.5 mg total) by mouth daily. 90 tablet 3   levothyroxine (SYNTHROID) 100 MCG tablet TAKE 1 TABLET BY MOUTH DAILY 90 tablet 3   nystatin-lidocaine-prednisoLONE-diphenhydrAMINE-distilled water-alum & mag hydroxide-simeth Swish and spit 5-1106mfour times a day as needed 480 mL 3   olopatadine (PATANOL) 0.1 % ophthalmic solution Place 1 drop into the right eye 2 (two) times daily. 5 mL 12   palbociclib (IBRANCE) 125 MG tablet Take 1 tablet (125 mg total) by mouth daily. Take for 21 days, then hold for 7 days. Repeat every 28 days. 21 tablet 3   potassium chloride SA (KLOR-CON M) 20 MEQ tablet TAKE 1 TABLET BY MOUTH TWICE DAILY 60 tablet 3   PREVIDENT 5000 PLUS 1.1 % CREA dental cream SMARTSIG:Application By Mouth     Probiotic Product (ALIGN PO) Take by mouth.     rosuvastatin (CRESTOR) 5 MG tablet Take 1 tablet (5 mg total) by mouth daily. 90 tablet 3   traMADol (ULTRAM) 50 MG tablet Take 1 tablet (50 mg total) by mouth every  6 (six) hours as needed. 60 tablet 0   traZODone (DESYREL) 50 MG tablet TAKE 1 TABLET BY MOUTH DAILY 30 tablet 6   triamcinolone cream (KENALOG) 0.1 % Apply 1 application. topically 2 (two) times daily. 30 g 0   No current facility-administered medications for this visit.    VITAL SIGNS: There were no vitals taken for this visit. There were no vitals filed for this visit.   Estimated body mass index is 35.35 kg/m as calculated from the following:   Height as of an earlier encounter on 09/14/21: 5\' 6"  (1.676 m).   Weight as of an earlier encounter on 09/14/21: 99.3 kg (219 lb).  LABS: CBC:    Component Value Date/Time   WBC 1.7 (L) 09/14/2021 0900   HGB 10.1 (L) 09/14/2021 0900   HGB 14.2 02/11/2011 1349   HCT 29.6 (L) 09/14/2021 0900   HCT 40.9 02/11/2011 1349   PLT 110 (L) 09/14/2021 0900   PLT 151 02/11/2011 1349   MCV 101.4 (H) 09/14/2021 0900   MCV 89  02/11/2011 1349   NEUTROABS 1.0 (L) 09/14/2021 0900   LYMPHSABS 0.3 (L) 09/14/2021 0900   MONOABS 0.3 09/14/2021 0900   EOSABS 0.0 09/14/2021 0900   BASOSABS 0.0 09/14/2021 0900   Comprehensive Metabolic Panel:    Component Value Date/Time   NA 136 09/14/2021 0900   NA 142 02/11/2011 1349   K 3.9 09/14/2021 0900   K 3.8 02/11/2011 1349   CL 99 09/14/2021 0900   CL 107 02/11/2011 1349   CO2 26 09/14/2021 0900   CO2 24 02/11/2011 1349   BUN 27 (H) 09/14/2021 0900   BUN 14 02/11/2011 1349   CREATININE 1.50 (H) 09/14/2021 0900   CREATININE 0.65 02/11/2011 1349   GLUCOSE 97 09/14/2021 0900   GLUCOSE 98 02/11/2011 1349   CALCIUM 9.4 09/14/2021 0900   CALCIUM 9.0 02/11/2011 1349   AST 24 09/14/2021 0900   AST 27 02/11/2011 1349   ALT 18 09/14/2021 0900   ALT 36 02/11/2011 1349   ALKPHOS 82 09/14/2021 0900   ALKPHOS 122 02/11/2011 1349   BILITOT 0.7 09/14/2021 0900   BILITOT 0.5 02/11/2011 1349   PROT 7.5 09/14/2021 0900   PROT 7.0 02/11/2011 1349   ALBUMIN 4.2 09/14/2021 0900   ALBUMIN 4.1 02/11/2011 1349    RADIOGRAPHIC STUDIES: MM 3D SCREEN BREAST UNI RIGHT  Result Date: 09/13/2021 CLINICAL DATA:  Screening. EXAM: DIGITAL SCREENING UNILATERAL RIGHT MAMMOGRAM WITH CAD AND TOMOSYNTHESIS TECHNIQUE: Right screening digital craniocaudal and mediolateral oblique mammograms were obtained. Right screening digital breast tomosynthesis was performed. The images were evaluated with computer-aided detection. COMPARISON:  Previous exam(s). ACR Breast Density Category c: The breast tissue is heterogeneously dense, which may obscure small masses. FINDINGS: There are no findings suspicious for malignancy. IMPRESSION: No mammographic evidence of malignancy. A result letter of this screening mammogram will be mailed directly to the patient. RECOMMENDATION: Screening mammogram in one year. (Code:SM-B-01Y) BI-RADS CATEGORY  1: Negative. Electronically Signed   By: Lovey Newcomer M.D.   On: 09/13/2021  08:41     Assessment and Plan-  Reviewed CBC and CMP, ANC 1.0, continue palbociclib 125mg  21on/7off She is still on an abx for her dental issue  Continue to hold Xgeva, not yet cleared by dentist to resume treatment.  Mild fatigue, but no other reported side effects  Oral Chemotherapy Adherence: No reported missed doses  New medications: none, reported  Medication Access Issues: No issues, fills her Ibrance at [facility].  Other: Previous patient advocate Dennison Nancy was helping Ms. Novell submit her insurance premiums to [employer] for reimbursement. I have explained to Ms. Peavy that this is an extra thing Bethena Roys was doing to assist her and not a service provided as part of her job. The new patient advocate will not be able to assist with this premium reimbursement as it is not related to oral chemotherapy access. Ms. Procell will need to submit her reimbursement to [employer] directly and re-enroll herself if needed. Offered a referral to [place] social worker if needed to address her financial concerns, she declined at this time.  Referral sent for OT evaluation for patient's reported hip movement issues  Patient expressed understanding and was in agreement with this plan. She also understands that She can call clinic at any time with any questions, concerns, or complaints.   Follow-up plan: RTC in 4 weeks  Thank you for allowing me to participate in the care of this very pleasant patient.   Time Total: 15 mins  Visit consisted of counseling and education on dealing with issues of symptom management in the setting of serious and potentially life-threatening illness.Greater than 50%  of this time was spent counseling and coordinating care related to the above assessment and plan.  Signed by: Darl Pikes, PharmD, BCPS, Salley Slaughter, CPP Hematology/Oncology Clinical Pharmacist Practitioner [facility]/AP [facility] [phone]  09/15/2021 3:40 PM

## 2021-09-16 ENCOUNTER — Encounter: Payer: Self-pay | Admitting: Oncology

## 2021-09-16 ENCOUNTER — Other Ambulatory Visit: Payer: Self-pay | Admitting: Internal Medicine

## 2021-09-21 LAB — CANCER ANTIGEN 27.29: CA 27.29: 82 U/mL — ABNORMAL HIGH (ref 0.0–38.6)

## 2021-09-25 ENCOUNTER — Other Ambulatory Visit (HOSPITAL_COMMUNITY): Payer: Self-pay

## 2021-09-26 ENCOUNTER — Other Ambulatory Visit: Payer: Self-pay | Admitting: *Deleted

## 2021-09-26 MED ORDER — AMLODIPINE BESYLATE 5 MG PO TABS: 5.0000 mg | ORAL_TABLET | Freq: Every day | ORAL | 3 refills | Status: DC

## 2021-09-27 ENCOUNTER — Inpatient Hospital Stay: Payer: Medicare Other | Attending: Oncology | Admitting: Occupational Therapy

## 2021-09-27 DIAGNOSIS — C50912 Malignant neoplasm of unspecified site of left female breast: Secondary | ICD-10-CM | POA: Insufficient documentation

## 2021-09-27 DIAGNOSIS — Z79899 Other long term (current) drug therapy: Secondary | ICD-10-CM | POA: Insufficient documentation

## 2021-09-27 DIAGNOSIS — Z5111 Encounter for antineoplastic chemotherapy: Secondary | ICD-10-CM | POA: Insufficient documentation

## 2021-09-27 DIAGNOSIS — M25552 Pain in left hip: Secondary | ICD-10-CM

## 2021-09-27 DIAGNOSIS — C7951 Secondary malignant neoplasm of bone: Secondary | ICD-10-CM | POA: Insufficient documentation

## 2021-09-27 NOTE — Therapy (Signed)
[facility] at [facility] [place], [place], [place], [address] Phone: [phone]   Fax:  [phone]  Occupational Therapy Screen  Patient Details  Name: Alicia [name] MRN: [id number] Date of Birth: 1950/07/25 No data recorded  Encounter Date: 09/27/2021   OT End of Session - 09/27/21 2144     Visit Number 0             Past Medical History:  Diagnosis Date   Anxiety    Breast cancer, left ([place]) 07/2017   Depression    Hypertension    Hypothyroidism    Personal history of chemotherapy 2019   LEFT mastectomy-chemo before   Personal history of radiation therapy 03/2018   LEFT mastectomy   Rapid heart rate    Thyroid disease     Past Surgical History:  Procedure Laterality Date   AXILLARY LYMPH NODE BIOPSY Left 07/16/2017   METASTATIC MAMMARY CARCINOMA   BREAST BIOPSY Left 07/16/2017   [place] bx of left breast mass and left breast LN.  INVASIVE MAMMARY CARCINOMA, NO SPECIAL TYPE.    BREAST EXCISIONAL BIOPSY Right 2001   benign   BREAST LUMPECTOMY WITH SENTINEL LYMPH NODE BIOPSY Left 12/06/2017   Procedure: BREAST LUMPECTOMY WITH SENTINEL LYMPH NODE BX;  Surgeon: Robert Bellow, MD;  Location: [facility] ORS;  Service: General;  Laterality: Left;   COLONOSCOPY     MASTECTOMY Left 12/23/2017   PORTACATH PLACEMENT Right 08/07/2017   Procedure: INSERTION PORT-A-CATH;  Surgeon: Robert Bellow, MD;  Location: [facility] ORS;  Service: General;  Laterality: Right;   SIMPLE MASTECTOMY WITH AXILLARY SENTINEL NODE BIOPSY Left 12/23/2017   T2,N2 with 6/7 nodes positive. Whole breast radiation.  Surgeon: Robert Bellow, MD;  Location: [facility] ORS;  Service: General;  Laterality: Left;   TONSILLECTOMY      There were no vitals filed for this visit.   Subjective Assessment - 09/27/21 2142     Subjective  The pharmacist referred me to you for my left hip that still hurting.  The previous time when I had radiation that helped  for the pain.  At this time I still have pain when I bend my hip and walk.  And afraid to move around too much and fall.  They are repeating my PET scan to see because my radiation was done on the PET scan that was done on February.    Currently in Pain? Yes    Pain Score 3     Pain Location Groin    Pain Orientation Left    Pain Descriptors / Indicators Aching    Pain Type Acute pain    Pain Frequency Intermittent                 NOTE DR Grayland Ormond 09/14/21: ONCOLOGY HISTORY: Patient initially received neoadjuvant chemotherapy with Adriamycin and Cytoxan followed by weekly Taxol. She only received 4 cycles of weekly Taxol prior to discontinuation of treatment on October 31, 2017 secondary to persistent peripheral neuropathy.  She ultimately required mastectomy and final pathology noted 5 of 6 lymph nodes positive for disease.  Patient completed adjuvant XRT in mid April 2020.  Pain initiated letrozole in May 2020, this was discontinued secondary to side effects and patient was started on anastrozole.  Nuclear medicine bone scan on June 19, 2019 revealed metastatic disease and anastrozole was subsequently discontinued.   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   PLAN:  1. 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  MRI of the brain on August 24, 2019 reviewed independently with no obvious evidence of metastatic disease.  Repeat PET scan on March 02, 2021 revealed a hypermetabolic area in her left lateral iliac wing, but no other possible evidence of disease.  Hypermetabolism in her jaw is likely secondary to her ongoing dental issues.  Patient's CA 27-29 initially increased to 141.9, but appears to have plateaued since November 2021 between 46.9 and  96.5.  Her most recent result is 84.4.  Patient continues to have persistent neutropenia and thrombocytopenia, but these are mildly improved.  Proceed with Ibrance and Faslodex today.  Continue to hold Xgeva at this time given her issues with her mandible. Patient will return to clinic in 4 weeks for further evaluation and continuation of treatment.  Appreciate clinical pharmacy input. Repeat PET scan prior to next clinic visit. 2.  Peripheral neuropathy: Chronic and unchanged. 3.  History of pulmonary embolus: Patient was diagnosed with a small pulmonary embolus on January 10, 2018.  She is no longer on anticoagulation.  There was no evidence of recurrent PE on CT scan from October 27, 2019. 4.  Osteopenia: Patient's most recent bone mineral density on March 03, 2019 reported a T score of -1.8 which is only mildly decreased from 1 year prior when the reported T score was -1.6.  Continue calcium and vitamin D supplementation.  Continue to hold Xgeva as above. 5.  Left hip/flank pain: Patient has now completed XRT. PET scan as above. Follow up with radiation oncology as scheduled.  Continue tramadol as needed. 6.  Leukopenia: Improved.  This has ranged between 1.6 and 3.5 since July 2021.  Today's result is 1.7.  7.  Anemia: Chronic and unchanged. Hemoglobin is 10.1 today.   8.  Renal insufficiency: Chronic and unchanged. Creatinine is 1.5 today.   9.  Hypokalemia: Resolved.  Continue oral supplementation as prescribed.   10.  UTI: Resolved. 11.  Dental pathology: Continue follow with Dr. Hampton Abbot. 12.  Thrombocytopenia: Platelets improved to 110. Proceed with Ibrance as above.    OT SCREEN: 09/27/21    Patient referred to OT for left groin pain.  Had radiation.  But reports this time it did not help the pain is much as before.  Patient awaiting for another PET scan and will follow-up with Dr. Donella Stade again for radiation.  Upon assessment patient's pain mostly with hip flexion.  Strength within  functional limits for ankle knee and right hip. Patient did not have pain with any ankle or knee extension of flexion or dorsi and plantarflexion. No pain with internal or external rotation. Did not do at this stage a BERG balance test because of left groin pain and patient not able to do single stand or for weightbearing on left leg. Discussed with patient HEP to maintain her strength and functional mobility without aggravating her left groin pain HEP:  -Patient to do sit and stand out of chair with a pillow.-pain free- 2-3 x day -light and gentle hip extention stretch 5 reps hold 5-10 sec pain free -heel raises 10 reps pain free - side stepping at kitchen counter - small steps and 30 sec at time Make sure all loose carpets and picked up  And did recommend for her railing for bathtub and shower chair or bench  to decrease risk for falling Pt can follow up with me again after next appt with Dr Grayland Ormond                              Visit Diagnosis: Pain in left hip    Problem List Patient Active Problem List   Diagnosis Date Noted   Contact dermatitis 05/10/2021   Hematuria 03/31/2021   Urinary tract infection without hematuria 03/01/2021   PSVT (paroxysmal supraventricular tachycardia) ([place]) 02/20/2020   Mobitz type 1 second degree AV block 02/20/2020   Pure hypercholesterolemia 02/20/2020   Daytime somnolence 02/18/2020   Chest pain 11/13/2019   Palpitations 11/13/2019   Obesity (BMI 35.0-39.9 without comorbidity) 11/08/2019   Carcinoma of breast metastatic to bone ([place]) 09/27/2019   Anxiety 08/17/2019   Primary hypertension 08/17/2019   Gastroesophageal reflux disease without esophagitis 08/17/2019   Bone metastases 08/06/2019   Tachycardia 06/18/2019   Pulmonary embolism ([place]) 01/11/2018   Sepsis ([place]) 10/05/2017   Breast cancer, stage 2, left ([place]) 07/24/2017   Elevated troponin 02/17/2015    Rosalyn Gess, OTR/L,CLT 09/27/2021, 9:45  PM  [facility] at [facility] [address], [place], [place], [address] Phone: [phone]   Fax:  [phone]  Name: NARGIS ABRAMS MRN: [id number] Date of Birth: 16-Sep-1950

## 2021-09-29 ENCOUNTER — Other Ambulatory Visit: Payer: Self-pay | Admitting: Oncology

## 2021-10-02 ENCOUNTER — Ambulatory Visit
Admission: RE | Admit: 2021-10-02 | Discharge: 2021-10-02 | Disposition: A | Discharge status: Home / Self Care | Payer: Medicare Other | Source: Ambulatory Visit | Attending: Oncology | Admitting: Oncology

## 2021-10-02 DIAGNOSIS — Z9012 Acquired absence of left breast and nipple: Secondary | ICD-10-CM | POA: Diagnosis not present

## 2021-10-02 DIAGNOSIS — C7951 Secondary malignant neoplasm of bone: Secondary | ICD-10-CM | POA: Insufficient documentation

## 2021-10-02 DIAGNOSIS — C50912 Malignant neoplasm of unspecified site of left female breast: Secondary | ICD-10-CM | POA: Diagnosis not present

## 2021-10-02 LAB — GLUCOSE, CAPILLARY: Glucose-Capillary: 103 mg/dL — ABNORMAL HIGH (ref 70–99)

## 2021-10-02 MED ORDER — FLUDEOXYGLUCOSE F - 18 (FDG) INJECTION
11.3000 | Freq: Once | INTRAVENOUS | Status: AC | PRN
  Administered 2021-10-02: 12.35 via INTRAVENOUS

## 2021-10-03 ENCOUNTER — Other Ambulatory Visit (HOSPITAL_COMMUNITY): Payer: Self-pay

## 2021-10-05 ENCOUNTER — Ambulatory Visit
Admission: RE | Admit: 2021-10-05 | Discharge: 2021-10-05 | Disposition: A | Discharge status: Home / Self Care | Payer: Medicare Other | Source: Ambulatory Visit | Attending: Radiation Oncology | Admitting: Radiation Oncology

## 2021-10-05 VITALS — BP 120/54 | HR 73 | Temp 96.3°F | Resp 16 | Wt 221.0 lb

## 2021-10-05 DIAGNOSIS — C50412 Malignant neoplasm of upper-outer quadrant of left female breast: Secondary | ICD-10-CM | POA: Diagnosis not present

## 2021-10-05 DIAGNOSIS — Z170 Estrogen receptor positive status [ER+]: Secondary | ICD-10-CM | POA: Insufficient documentation

## 2021-10-05 DIAGNOSIS — Z923 Personal history of irradiation: Secondary | ICD-10-CM | POA: Insufficient documentation

## 2021-10-05 DIAGNOSIS — C7951 Secondary malignant neoplasm of bone: Secondary | ICD-10-CM | POA: Insufficient documentation

## 2021-10-05 DIAGNOSIS — C50919 Malignant neoplasm of unspecified site of unspecified female breast: Secondary | ICD-10-CM

## 2021-10-05 NOTE — [place] Notes (Signed)
Radiation Oncology Follow up Note  Name: Alicia [name]   Date:   10/05/2021 MRN:  [id number] DOB: 02-10-1950    This 71 y.o. female presents to the clinic today for 1 month follow-up status post palliative radiation therapy to her left iliac wing and patient with known stage IV breast cancer.  REFERRING PROVIDER: [name], MD  HPI: Patient is a 71 year old female now at 1 month and receive palliative radiation therapy to her left iliac wing for metastatic disease involved with stage IV breast cancer.  Seen today in routine follow-up she is still limping a little bit.  She did have fracture of the iliac wing from her tumor recently had a repeat PET CT scan.  Showing no real progression of disease.  The left iliac crest is decreased in metabolic activity.  There is questionable new activity in the neural arch of C5 indeterminate soft tissue activity.  She is not having any neck pain.  COMPLICATIONS OF TREATMENT: none  FOLLOW UP COMPLIANCE: keeps appointments   PHYSICAL EXAM:  BP (!) 120/54 (BP Location: Right Arm, Patient Position: Sitting)   Pulse 73   Temp (!) 96.3 F (35.7 C) (Tympanic)   Resp 16   Wt 221 lb (100.2 kg)   SpO2 100%   BMI 35.67 kg/m  Well-developed well-nourished patient in NAD. HEENT reveals PERLA, EOMI, discs not visualized.  Oral cavity is clear. No oral mucosal lesions are identified. Neck is clear without evidence of cervical or supraclavicular adenopathy. Lungs are clear to A&P. Cardiac examination is essentially unremarkable with regular rate and rhythm without murmur rub or thrill. Abdomen is benign with no organomegaly or masses noted. Motor sensory and DTR levels are equal and symmetric in the upper and lower extremities. Cranial nerves II through XII are grossly intact. Proprioception is intact. No peripheral adenopathy or edema is identified. No motor or sensory levels are noted. Crude visual fields are within normal range.  RADIOLOGY RESULTS: PET scan  reviewed compatible with above-stated findings  PLAN: Present time I am not inclined to treat any other areas.  She is having no pain in her cervical spine we will keep an eye on that area.  I am turning follow-up care back over to medical oncology.  I be happy to reevaluate the patient anytime should that be consistent indicated.  I would like to take this opportunity to thank you for allowing me to participate in the care of your patient.Noreene Filbert, MD

## 2021-10-06 NOTE — Progress Notes (Signed)
[place]  Telephone:([phone]) [phone] Fax:(336) [phone]  ID: [name] OB: 09-28-1950  MR#: [id number]  NUU#:[id number]  Patient Care Team: [name], [name], Harrell Gave, [name] Rico Junker, RN as Registered Nurse Lloyd Huger, MD as Consulting Physician (Oncology) Bary Castilla Forest Gleason, MD as Consulting Physician (General Surgery) Bary Castilla Forest Gleason, MD as Consulting Physician (General Surgery) Noreene Filbert, MD as Referring Physician (Radiation Oncology) Jeral Fruit, RN as Registered Nurse   CHIEF COMPLAINT: Recurrent stage IV ER/PR positive, 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  INTERVAL HISTORY: Patient returns to clinic today for further evaluation, discussion of her PET scan results, and continuation of Ibrance and Faslodex.  She continues to have left hip pain, but otherwise feels well.  Her jaw pain has improved.  She is followed closely by an oral surgeon.  She does not complain of weakness or fatigue today.  She continues to have a mild peripheral neuropathy, but no other neurologic complaints.  She denies any chest pain, shortness of breath, cough, or hemoptysis.  She denies any nausea, vomiting, constipation, or diarrhea.  She has no urinary complaints.  Patient has no further specific complaints today.  REVIEW OF SYSTEMS:   Review of Systems  Constitutional: Negative.  Negative for fever, malaise/fatigue and weight loss.  HENT:  Negative for congestion.   Respiratory: Negative.  Negative for cough and shortness of breath.   Cardiovascular: Negative.  Negative for chest pain and leg swelling.  Gastrointestinal: Negative.  Negative for abdominal pain, constipation, diarrhea and nausea.  Genitourinary: Negative.  Negative for dysuria, flank pain and urgency.  Musculoskeletal:  Positive for joint pain. Negative for back pain.  Skin: Negative.   Negative for rash.  Neurological:  Positive for tingling and sensory change. Negative for dizziness, focal weakness, weakness and headaches.  Psychiatric/Behavioral:  The patient is not nervous/anxious.     As per HPI. Otherwise, a complete review of systems is negative.  PAST MEDICAL HISTORY: Past Medical History:  Diagnosis Date   Anxiety    Breast cancer, left ([place]) 07/2017   Depression    Hypertension    Hypothyroidism    Personal history of chemotherapy 2019   LEFT mastectomy-chemo before   Personal history of radiation therapy 03/2018   LEFT mastectomy   Rapid heart rate    Thyroid disease     PAST SURGICAL HISTORY: Past Surgical History:  Procedure Laterality Date   AXILLARY LYMPH NODE BIOPSY Left 07/16/2017   METASTATIC MAMMARY CARCINOMA   BREAST BIOPSY Left 07/16/2017   [place] bx of left breast mass and left breast LN.  INVASIVE MAMMARY CARCINOMA, NO SPECIAL TYPE.    BREAST EXCISIONAL BIOPSY Right 2001   benign   BREAST LUMPECTOMY WITH SENTINEL LYMPH NODE BIOPSY Left 12/06/2017   Procedure: BREAST LUMPECTOMY WITH SENTINEL LYMPH NODE BX;  Surgeon: Robert Bellow, MD;  Location: [facility] ORS;  Service: General;  Laterality: Left;   COLONOSCOPY     MASTECTOMY Left 12/23/2017   PORTACATH PLACEMENT Right 08/07/2017   Procedure: INSERTION PORT-A-CATH;  Surgeon: Robert Bellow, MD;  Location: [facility] OR[facility];  Service: General;  Laterality: Right;   SIMPLE MASTECTOMY WITH AXILLARY SENTINEL NODE BIOPSY Left 12/23/2017   T2,N2 with 6/7 nodes positive. Whole breast radiation.  Surgeon: Robert Bellow, MD;  Location: [facility] ORS;  Service: General;  Laterality: Left;   TONSILLECTOMY      FAMILY HISTORY: Family History  Problem Relation Age of Onset   Stroke Mother    Thyroid disease Mother    Renal Disease Mother    Stroke Father    Heart attack Father    Sudden death Father 26       suicide   Anuerysm Brother    Breast cancer Neg Hx     ADVANCED DIRECTIVES (Y/N):   N  HEALTH MAINTENANCE: Social History   Tobacco Use   Smoking status: Former    Packs/day: 1.00    Years: 30.00    Total pack years: 30.00    Types: Cigarettes    Quit date: 2019    Years since quitting: 4.7   Smokeless tobacco: Never  Vaping Use   Vaping Use: Never used  Substance Use Topics   Alcohol use: Not Currently   Drug use: Never     Colonoscopy:  PAP:  Bone density:  Lipid panel:  Allergies  Allergen Reactions   Sulfa Antibiotics Diarrhea    Current Outpatient Medications  Medication Sig Dispense Refill   acetaminophen (TYLENOL) 500 MG tablet Take 1,000 mg by mouth every 6 (six) hours as needed for moderate pain or headache.      ALPRAZolam (XANAX) 0.5 MG tablet Take 1 tablet (0.5 mg total) by mouth 2 (two) times daily. Taking once/day 60 tablet 0   amLODipine (NORVASC) 5 MG tablet Take 1 tablet (5 mg total) by mouth daily. 90 tablet 3   aspirin EC 81 MG tablet Take 81 mg by mouth at bedtime.      calcium carbonate (OS-CAL - DOSED IN MG OF ELEMENTAL CALCIUM) 1250 (500 Ca) MG tablet Take 1 tablet by mouth daily with breakfast.     chlorhexidine (PERIDEX) 0.12 % solution SMARTSIG:By Mouth     Cholecalciferol (VITAMIN D3) 125 MCG (5000 UT) CAPS Take 1 capsule by mouth 2 (two) times a day.     docusate sodium (COLACE) 100 MG capsule Take 100 mg by mouth 3 (three) times daily as needed for mild constipation.     Erythromycin 500 MG TBEC Take 1 tablet by mouth 3 (three) times daily.     hydrochlorothiazide (HYDRODIURIL) 12.5 MG tablet Take 1 tablet (12.5 mg total) by mouth daily. 90 tablet 3   levothyroxine (SYNTHROID) 100 MCG tablet TAKE 1 TABLET BY MOUTH DAILY 90 tablet 3   nystatin-lidocaine-prednisoLONE-diphenhydrAMINE-distilled water-alum & mag hydroxide-simeth Swish and spit 5-29m four times a day as needed 480 mL 3   olopatadine (PATANOL) 0.1 % ophthalmic solution Place 1 drop into the right eye 2 (two) times daily. 5 mL 12   palbociclib (IBRANCE) 125 MG  tablet Take 1 tablet (125 mg total) by mouth daily. Take for 21 days, then hold for 7 days. Repeat every 28 days. 21 tablet 3   potassium chloride SA (KLOR-CON M) 20 MEQ tablet TAKE 1 TABLET BY MOUTH TWICE DAILY 60 tablet 3   PREVIDENT 5000 PLUS 1.1 % CREA dental cream SMARTSIG:Application By Mouth     Probiotic Product (ALIGN PO) Take by mouth.     rosuvastatin (CRESTOR) 5 MG tablet Take 1 tablet (5 mg total) by mouth daily. 90 tablet 3   traMADol (ULTRAM) 50 MG tablet Take 1 tablet (50 mg total) by mouth every 6 (six) hours as needed. 60 tablet 0   traZODone (DESYREL) 50 MG tablet TAKE 1 TABLET BY MOUTH DAILY 30 tablet 6   triamcinolone cream (KENALOG) 0.1 % Apply 1 application. topically 2 (two) times daily. 30 g 0  No current facility-administered medications for this visit.    OBJECTIVE: Vitals:   10/12/21 1256  BP: (!) 112/52  Pulse: 70  Resp: 16  Temp: (!) 96.5 F (35.8 C)  SpO2: 100%     Body mass index is 35.51 kg/m.    ECOG FS:0 - Asymptomatic  General: Well-developed, well-nourished, no acute distress. Eyes: Pink conjunctiva, anicteric sclera. HEENT: Normocephalic, moist mucous membranes. Lungs: No audible wheezing or coughing. Heart: Regular rate and rhythm. Abdomen: Soft, nontender, no obvious distention. Musculoskeletal: No edema, cyanosis, or clubbing. Neuro: Alert, answering all questions appropriately. Cranial nerves grossly intact. Skin: No rashes or petechiae noted. Psych: Normal affect.  LAB RESULTS:  Lab Results  Component Value Date   NA 136 10/12/2021   K 3.6 10/12/2021   CL 103 10/12/2021   CO2 25 10/12/2021   GLUCOSE 112 (H) 10/12/2021   BUN 22 10/12/2021   CREATININE 1.48 (H) 10/12/2021   CALCIUM 9.4 10/12/2021   PROT 7.4 10/12/2021   ALBUMIN 4.1 10/12/2021   AST 27 10/12/2021   ALT 16 10/12/2021   ALKPHOS 79 10/12/2021   BILITOT 0.7 10/12/2021   GFRNONAA 37 (L) 10/12/2021   GFRAA 57 (L) 10/01/2019    Lab Results  Component Value  Date   WBC 1.5 (L) 10/12/2021   NEUTROABS 1.0 (L) 10/12/2021   HGB 10.0 (L) 10/12/2021   HCT 29.3 (L) 10/12/2021   MCV 101.0 (H) 10/12/2021   PLT 112 (L) 10/12/2021     STUDIES: NM PET Image Restag (PS) Skull Base To Thigh  Result Date: 10/03/2021 CLINICAL DATA:  Subsequent treatment strategy for LEFT breast carcinoma. Lymphadenectomy and sentinel node removal 2019. Simple mastectomy 2019. Radiation therapy to the LEFT iliac wing 2023. EXAM: NUCLEAR MEDICINE PET SKULL BASE TO THIGH TECHNIQUE: 12.4 mCi F-18 FDG was injected intravenously. Full-ring PET imaging was performed from the skull base to thigh after the radiotracer. CT data was obtained and used for attenuation correction and anatomic localization. Fasting blood glucose: 103 mg/dl COMPARISON:  None Available. FINDINGS: Mediastinal blood pool activity: SUV max 2.6 Liver activity: SUV max NA NECK: No hypermetabolic lymph nodes. Intense skeletal metabolic activity in the RIGHT angle of the jaw with SUV max equal 8.7 compared to SUV max equal 9.3 on comparison exam. There is sclerotic change and bone expansion on CT portion exam (image 22/series 2). Additionally in the neck there is a focus of metabolic activity in the region spinous process of the C5 vertebral body. Difficult to discern if this is within the soft tissues or within bone. No bony lesion identified on CT portion exam. Incidental CT findings: None. CHEST: No hypermetabolic mediastinal or hilar nodes. No suspicious pulmonary nodules on the CT scan. Incidental CT findings: Post LEFT mastectomy and axillary dissection ABDOMEN/PELVIS: No abnormal hypermetabolic activity within the liver, pancreas, adrenal glands, or spleen. No hypermetabolic lymph nodes in the abdomen or pelvis. Incidental CT findings: Large benign LEFT renal cyst. SKELETON: Focal activity in the LEFT iliac crest associated lytic lesion on CT portion exam decreased from prior with SUV max equal 5.8 compared SUV max equal  11.9. Focal activity in the tip of the LEFT scapula SUV max equal 4.1. Activity at the RIGHT angle of jaw all described in the neck section. Incidental CT findings: None. IMPRESSION: 1. No clear evidence of metastatic progression. Bone lesion in the LEFT iliac crest is decreased in metabolic activity. Lesion the RIGHT angle of the jaw is mildly decreased in metabolic activity. LEFT scapular lesion  is similar. Potential new activity in the neural arch of the C5 vertebral body versus indeterminate soft tissue activity. 2. No evidence of visceral metastasis or lymphadenopathy. 3. Post LEFT mastectomy anatomy. Electronically Signed   By: Suzy Bouchard M.D.   On: 10/03/2021 13:52     ONCOLOGY HISTORY: Patient initially received neoadjuvant chemotherapy with Adriamycin and Cytoxan followed by weekly Taxol. She only received 4 cycles of weekly Taxol prior to discontinuation of treatment on October 31, 2017 secondary to persistent peripheral neuropathy.  She ultimately required mastectomy and final pathology noted 5 of 6 lymph nodes positive for disease.  Patient completed adjuvant XRT in mid April 2020.  Pain initiated letrozole in May 2020, this was discontinued secondary to side effects and patient was started on anastrozole.  Nuclear medicine bone scan on June 19, 2019 revealed metastatic disease and anastrozole was subsequently discontinued.  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  PLAN:    1. 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  MRI of the brain on August 24, 2019 reviewed independently with no obvious evidence of metastatic disease.  Repeat PET scan on October 02, 2021 reviewed independently and reported as above with overall improvement of disease burden.   Patient's CA 27-29 initially increased to 141.9, but appears to have plateaued since November 2021 between 46.9 and 96.5.  Her most recent result was 71.8.  Patient continues to have persistent neutropenia and thrombocytopenia.  Proceed with Ibrance and Faslodex today.  Continue to hold Xgeva at this time given her issues with her mandible.  Return to clinic in 4 weeks for further evaluation and continuation of treatment.  Appreciate clinical pharmacy input.   2.  Peripheral neuropathy: Chronic and unchanged. 3.  History of pulmonary embolus: Patient was diagnosed with a small pulmonary embolus on January 10, 2018.  She is no longer on anticoagulation.  There was no evidence of recurrent PE on CT scan from October 27, 2019. 4.  Osteopenia: Patient's most recent bone mineral density on March 03, 2019 reported a T score of -1.8 which is only mildly decreased from 1 year prior when the reported T score was -1.6.  Continue calcium and vitamin D supplementation.  Continue to hold Xgeva as above. 5.  Left hip/flank pain: Patient was evaluated by radiation oncology who determined that no further XRT is possible.  Concern for possible fracture, therefore we will get a dedicated left hip MRI to further evaluate.  Consider referral to orthopedics.  Patient reports she takes pain medication sparingly.   6.  Leukopenia: Slightly worse today with a total white blood cell count of 1.5 and an ANC of 1.0.  This has ranged between 1.6 and 3.5 since July 2021.  7.  Anemia: Chronic and unchanged.  Patient's hemoglobin is 10.0 today. 8.  Renal insufficiency: Chronic and unchanged.  Patient's creatinine is 1.48. 9.  Hypokalemia: Resolved.  Continue oral supplementation as prescribed.   10.  UTI: Resolved. 11.  Dental pathology: Continue follow with Dr. Hampton Abbot. 12.  Thrombocytopenia: Chronic and unchanged.  Patient platelet count is 112 today.  Proceed with treatment as above.  Patient expressed understanding and was in  agreement with this plan. She also understands that She can call clinic at any time with any questions, concerns, or complaints.    Cancer Staging  Breast cancer, stage 2, left ([place]) Staging form: Breast, AJCC 8th Edition - Clinical stage from 07/30/2017: Stage IIA (cT2, cN1, cM0, G2, ER+, PR+, HER2-) - Signed by Lloyd Huger, MD on 07/30/2017 Histologic grading system: 3 grade system Laterality: Left   Lloyd Huger, MD   10/13/2021 12:43 PM

## 2021-10-10 ENCOUNTER — Other Ambulatory Visit (HOSPITAL_COMMUNITY): Payer: Self-pay

## 2021-10-11 NOTE — Progress Notes (Incomplete)
[place]  [phone] [phone]  Patient Care Team: [name], [name], Harrell Gave, [name] Rico Junker, RN as Registered Nurse Grayland Ormond Kathlene November, MD as Consulting Physician (Oncology) Bary Castilla Forest Gleason, MD as Consulting Physician (General Surgery) Bary Castilla Forest Gleason, MD as Consulting Physician (General Surgery) Noreene Filbert, MD as Referring Physician (Radiation Oncology) Jeral Fruit, RN as Registered Nurse   Name of the patient: Alicia [name]  [id number]  04/14/1950   Date of visit: 10/12/2021  HPI: Patient is a 71 y.o. female with recurrent stage IV ER/PR positive, HER2 negative breast cancer. Patient started on Ibrance on 07/09/19.   Reason for Consult: Oral chemotherapy follow-up for Ibrance (palbociclib) therapy.   PAST MEDICAL HISTORY: Past Medical History:  Diagnosis Date   Anxiety    Breast cancer, left ([place]) 07/2017   Depression    Hypertension    Hypothyroidism    Personal history of chemotherapy 2019   LEFT mastectomy-chemo before   Personal history of radiation therapy 03/2018   LEFT mastectomy   Rapid heart rate    Thyroid disease     PAST SURGICAL HISTORY:  Past Surgical History:  Procedure Laterality Date   AXILLARY LYMPH NODE BIOPSY Left 07/16/2017   METASTATIC MAMMARY CARCINOMA   BREAST BIOPSY Left 07/16/2017   [place] bx of left breast mass and left breast LN.  INVASIVE MAMMARY CARCINOMA, NO SPECIAL TYPE.    BREAST EXCISIONAL BIOPSY Right 2001   benign   BREAST LUMPECTOMY WITH SENTINEL LYMPH NODE BIOPSY Left 12/06/2017   Procedure: BREAST LUMPECTOMY WITH SENTINEL LYMPH NODE BX;  Surgeon: Robert Bellow, MD;  Location: [facility] ORS;  Service: General;  Laterality: Left;   COLONOSCOPY     MASTECTOMY Left 12/23/2017   PORTACATH PLACEMENT Right 08/07/2017   Procedure: INSERTION PORT-A-CATH;  Surgeon:  Robert Bellow, MD;  Location: [facility] ORS;  Service: General;  Laterality: Right;   SIMPLE MASTECTOMY WITH AXILLARY SENTINEL NODE BIOPSY Left 12/23/2017   T2,N2 with 6/7 nodes positive. Whole breast radiation.  Surgeon: Robert Bellow, MD;  Location: [facility] ORS;  Service: General;  Laterality: Left;   TONSILLECTOMY      HEMATOLOGY/ONCOLOGY HISTORY:  Oncology History Overview Note  Patient is a 71 year old female who recently self palpated a mass on her left breast.  Subsequent imaging and biopsy revealed the above-stated breast cancer.  Case was also discussed extensively at case conference.  Given the size and the stage of patient's malignancy, she will benefit from neoadjuvant chemotherapy using Adriamycin, Cytoxan, and Taxol.  Patient will also require Neulasta support.  Will get CT scan of the chest, abdomen, and pelvis to assess for any metastatic disease.  Patient will also require port placement and MUGA prior to initiating treatment  CT abdomen/pelvis/chest did not reveal any suspicious lesions concerning for metastatic disease. (08/01/17)  Port-A-Cath placed on 08/07/2017.  Cycle 1 day 1 of AC was given on 08/08/17.     Breast cancer, stage 2, left ([place])  07/24/2017 Initial Diagnosis   Breast cancer, stage 2, left ([place])   07/30/2017 Cancer Staging   Staging form: Breast, AJCC 8th Edition - Clinical stage from 07/30/2017: Stage IIA (cT2, cN1, cM0, G2, ER+, PR+, HER2-) - Signed by Lloyd Huger, MD on 07/30/2017   08/08/2017 - 10/31/2017 Chemotherapy   The patient had DOXOrubicin (ADRIAMYCIN) chemo injection 130 mg, 60 mg/m2 = 130 mg, Intravenous,  Once, 4  of 4 cycles Administration: 130 mg (08/08/2017), 130 mg (08/22/2017), 130 mg (09/05/2017), 130 mg (09/19/2017) palonosetron (ALOXI) injection 0.25 mg, 0.25 mg, Intravenous,  Once, 4 of 4 cycles Administration: 0.25 mg (08/08/2017), 0.25 mg (08/22/2017), 0.25 mg (09/05/2017), 0.25 mg (09/19/2017) pegfilgrastim-cbqv (UDENYCA) injection 6  mg, 6 mg, Subcutaneous, Once, 4 of 4 cycles Administration: 6 mg (08/09/2017), 6 mg (08/23/2017), 6 mg (09/06/2017), 6 mg (09/20/2017) cyclophosphamide (CYTOXAN) 1,300 mg in sodium chloride 0.9 % 250 mL chemo infusion, 600 mg/m2 = 1,300 mg, Intravenous,  Once, 4 of 4 cycles Administration: 1,300 mg (08/08/2017), 1,300 mg (08/22/2017), 1,300 mg (09/05/2017), 1,300 mg (09/19/2017) PACLitaxel (TAXOL) 174 mg in sodium chloride 0.9 % 250 mL chemo infusion (</= 15m/m2), 80 mg/m2 = 174 mg, Intravenous,  Once, 4 of 12 cycles Dose modification: 72 mg/m2 (original dose 80 mg/m2, Cycle 6, Reason: Dose not tolerated) Administration: 174 mg (10/03/2017), 156 mg (10/17/2017), 156 mg (10/24/2017), 156 mg (10/31/2017) fosaprepitant (EMEND) 150 mg, dexamethasone (DECADRON) 12 mg in sodium chloride 0.9 % 145 mL IVPB, , Intravenous,  Once, 4 of 4 cycles Administration:  (08/08/2017),  (08/22/2017),  (09/05/2017),  (09/19/2017)  for chemotherapy treatment.      ALLERGIES:  is allergic to sulfa antibiotics.  MEDICATIONS:  Current Outpatient Medications  Medication Sig Dispense Refill   acetaminophen (TYLENOL) 500 MG tablet Take 1,000 mg by mouth every 6 (six) hours as needed for moderate pain or headache.      ALPRAZolam (XANAX) 0.5 MG tablet Take 1 tablet (0.5 mg total) by mouth 2 (two) times daily. Taking once/day 60 tablet 0   amLODipine (NORVASC) 5 MG tablet Take 1 tablet (5 mg total) by mouth daily. 90 tablet 3   aspirin EC 81 MG tablet Take 81 mg by mouth at bedtime.      calcium carbonate (OS-CAL - DOSED IN MG OF ELEMENTAL CALCIUM) 1250 (500 Ca) MG tablet Take 1 tablet by mouth daily with breakfast.     chlorhexidine (PERIDEX) 0.12 % solution SMARTSIG:By Mouth     Cholecalciferol (VITAMIN D3) 125 MCG (5000 UT) CAPS Take 1 capsule by mouth 2 (two) times a day.     docusate sodium (COLACE) 100 MG capsule Take 100 mg by mouth 3 (three) times daily as needed for mild constipation.     Erythromycin 500 MG TBEC Take 1 tablet by  mouth 3 (three) times daily.     hydrochlorothiazide (HYDRODIURIL) 12.5 MG tablet Take 1 tablet (12.5 mg total) by mouth daily. 90 tablet 3   levothyroxine (SYNTHROID) 100 MCG tablet TAKE 1 TABLET BY MOUTH DAILY 90 tablet 3   nystatin-lidocaine-prednisoLONE-diphenhydrAMINE-distilled water-alum & mag hydroxide-simeth Swish and spit 5-1106mfour times a day as needed 480 mL 3   olopatadine (PATANOL) 0.1 % ophthalmic solution Place 1 drop into the right eye 2 (two) times daily. 5 mL 12   palbociclib (IBRANCE) 125 MG tablet Take 1 tablet (125 mg total) by mouth daily. Take for 21 days, then hold for 7 days. Repeat every 28 days. 21 tablet 3   potassium chloride SA (KLOR-CON M) 20 MEQ tablet TAKE 1 TABLET BY MOUTH TWICE DAILY 60 tablet 3   PREVIDENT 5000 PLUS 1.1 % CREA dental cream SMARTSIG:Application By Mouth     Probiotic Product (ALIGN PO) Take by mouth.     rosuvastatin (CRESTOR) 5 MG tablet Take 1 tablet (5 mg total) by mouth daily. 90 tablet 3   traMADol (ULTRAM) 50 MG tablet Take 1 tablet (50 mg total) by mouth every  6 (six) hours as needed. 60 tablet 0   traZODone (DESYREL) 50 MG tablet TAKE 1 TABLET BY MOUTH DAILY 30 tablet 6   triamcinolone cream (KENALOG) 0.1 % Apply 1 application. topically 2 (two) times daily. 30 g 0   No current facility-administered medications for this visit.    VITAL SIGNS: There were no vitals taken for this visit. There were no vitals filed for this visit.   Estimated body mass index is 35.51 kg/m as calculated from the following:   Height as of an earlier encounter on 10/12/21: _0  (1.676 m).   Weight as of an earlier encounter on 10/12/21: 99.8 kg (220 lb).  LABS: CBC:    Component Value Date/Time   WBC 1.5 (L) 10/12/2021 1243   HGB 10.0 (L) 10/12/2021 1243   HGB 14.2 02/11/2011 1349   HCT 29.3 (L) 10/12/2021 1243   HCT 40.9 02/11/2011 1349   PLT 112 (L) 10/12/2021 1243   PLT 151 02/11/2011 1349   MCV 101.0 (H) 10/12/2021 1243   MCV 89  02/11/2011 1349   NEUTROABS 1.0 (L) 10/12/2021 1243   LYMPHSABS 0.2 (L) 10/12/2021 1243   MONOABS 0.2 10/12/2021 1243   EOSABS 0.0 10/12/2021 1243   BASOSABS 0.0 10/12/2021 1243   Comprehensive Metabolic Panel:    Component Value Date/Time   NA 136 10/12/2021 1243   NA 142 02/11/2011 1349   K 3.6 10/12/2021 1243   K 3.8 02/11/2011 1349   CL 103 10/12/2021 1243   CL 107 02/11/2011 1349   CO2 25 10/12/2021 1243   CO2 24 02/11/2011 1349   BUN 22 10/12/2021 1243   BUN 14 02/11/2011 1349   CREATININE 1.48 (H) 10/12/2021 1243   CREATININE 0.65 02/11/2011 1349   GLUCOSE 112 (H) 10/12/2021 1243   GLUCOSE 98 02/11/2011 1349   CALCIUM 9.4 10/12/2021 1243   CALCIUM 9.0 02/11/2011 1349   AST 27 10/12/2021 1243   AST 27 02/11/2011 1349   ALT 16 10/12/2021 1243   ALT 36 02/11/2011 1349   ALKPHOS 79 10/12/2021 1243   ALKPHOS 122 02/11/2011 1349   BILITOT 0.7 10/12/2021 1243   BILITOT 0.5 02/11/2011 1349   PROT 7.4 10/12/2021 1243   PROT 7.0 02/11/2011 1349   ALBUMIN 4.1 10/12/2021 1243   ALBUMIN 4.1 02/11/2011 1349    RADIOGRAPHIC STUDIES: NM PET Image Restag (PS) Skull Base To Thigh  Result Date: 10/03/2021 CLINICAL DATA:  Subsequent treatment strategy for LEFT breast carcinoma. Lymphadenectomy and sentinel node removal 2019. Simple mastectomy 2019. Radiation therapy to the LEFT iliac wing 2023. EXAM: NUCLEAR MEDICINE PET SKULL BASE TO THIGH TECHNIQUE: 12.4 mCi F-18 FDG was injected intravenously. Full-ring PET imaging was performed from the skull base to thigh after the radiotracer. CT data was obtained and used for attenuation correction and anatomic localization. Fasting blood glucose: 103 mg/dl COMPARISON:  None Available. FINDINGS: Mediastinal blood pool activity: SUV max 2.6 Liver activity: SUV max NA NECK: No hypermetabolic lymph nodes. Intense skeletal metabolic activity in the RIGHT angle of the jaw with SUV max equal 8.7 compared to SUV max equal 9.3 on comparison exam. There  is sclerotic change and bone expansion on CT portion exam (image 22/series 2). Additionally in the neck there is a focus of metabolic activity in the region spinous process of the C5 vertebral body. Difficult to discern if this is within the soft tissues or within bone. No bony lesion identified on CT portion exam. Incidental CT findings: None. CHEST: No hypermetabolic mediastinal  or hilar nodes. No suspicious pulmonary nodules on the CT scan. Incidental CT findings: Post LEFT mastectomy and axillary dissection ABDOMEN/PELVIS: No abnormal hypermetabolic activity within the liver, pancreas, adrenal glands, or spleen. No hypermetabolic lymph nodes in the abdomen or pelvis. Incidental CT findings: Large benign LEFT renal cyst. SKELETON: Focal activity in the LEFT iliac crest associated lytic lesion on CT portion exam decreased from prior with SUV max equal 5.8 compared SUV max equal 11.9. Focal activity in the tip of the LEFT scapula SUV max equal 4.1. Activity at the RIGHT angle of jaw all described in the neck section. Incidental CT findings: None. IMPRESSION: 1. No clear evidence of metastatic progression. Bone lesion in the LEFT iliac crest is decreased in metabolic activity. Lesion the RIGHT angle of the jaw is mildly decreased in metabolic activity. LEFT scapular lesion is similar. Potential new activity in the neural arch of the C5 vertebral body versus indeterminate soft tissue activity. 2. No evidence of visceral metastasis or lymphadenopathy. 3. Post LEFT mastectomy anatomy. Electronically Signed   By: Suzy Bouchard M.D.   On: 10/03/2021 13:52     Assessment and Plan-  Reviewed CBC and CMP, ANC 1.0, continue palbociclib 165m 21on/7off She is still on an abx for her dental issue  Continue to hold Xgeva, not yet cleared by dentist to resume treatment.  Per patient at her visit with oral surgery last month she was told her jaw was improving Fatigue: mild, unchanged since last visit, she reports  having good days and bad days No other reported side effects  Oral Chemotherapy Adherence: No reported missed doses  New medications: none, reported  Medication Access Issues: No issues, fills her [facility]  Other: Patient received paper work for [employer] asking her to confirm her income for continued assistance. Patient met with BSuezanne Jacquet(patient advocate) today who helped her complete the paperwork and mailed it back to [employer]  Patient expressed understanding and was in agreement with this plan. She also understands that She can call clinic at any time with any questions, concerns, or complaints.   Follow-up plan: RTC in 4 weeks  Thank you for allowing me to participate in the care of this very pleasant patient.   Time Total: 15 mins  Visit consisted of counseling and education on dealing with issues of symptom management in the setting of serious and potentially life-threatening illness.Greater than 50%  of this time was spent counseling and coordinating care related to the above assessment and plan.  Signed by: ADarl Pikes PharmD, BCPS, BSalley Slaughter CPP Hematology/Oncology Clinical Pharmacist Practitioner [phone] 10/12/2021 3:22 PM

## 2021-10-12 ENCOUNTER — Inpatient Hospital Stay (HOSPITAL_BASED_OUTPATIENT_CLINIC_OR_DEPARTMENT_OTHER): Payer: Medicare Other | Admitting: Oncology

## 2021-10-12 ENCOUNTER — Inpatient Hospital Stay: Payer: Medicare Other | Admitting: Pharmacist

## 2021-10-12 ENCOUNTER — Inpatient Hospital Stay: Payer: Medicare Other

## 2021-10-12 ENCOUNTER — Encounter: Payer: Self-pay | Admitting: Oncology

## 2021-10-12 VITALS — BP 112/52 | HR 70 | Temp 96.5°F | Resp 16 | Ht 66.0 in | Wt 220.0 lb

## 2021-10-12 DIAGNOSIS — C50919 Malignant neoplasm of unspecified site of unspecified female breast: Secondary | ICD-10-CM

## 2021-10-12 DIAGNOSIS — C50912 Malignant neoplasm of unspecified site of left female breast: Secondary | ICD-10-CM

## 2021-10-12 DIAGNOSIS — Z79899 Other long term (current) drug therapy: Secondary | ICD-10-CM | POA: Diagnosis not present

## 2021-10-12 DIAGNOSIS — C7951 Secondary malignant neoplasm of bone: Secondary | ICD-10-CM

## 2021-10-12 DIAGNOSIS — Z5111 Encounter for antineoplastic chemotherapy: Secondary | ICD-10-CM | POA: Diagnosis not present

## 2021-10-12 DIAGNOSIS — M25552 Pain in left hip: Secondary | ICD-10-CM | POA: Diagnosis not present

## 2021-10-12 LAB — CBC WITH DIFFERENTIAL/PLATELET
Abs Immature Granulocytes: 0.01 10*3/uL (ref 0.00–0.07)
Basophils Absolute: 0 10*3/uL (ref 0.0–0.1)
Basophils Relative: 2 %
Eosinophils Absolute: 0 10*3/uL (ref 0.0–0.5)
Eosinophils Relative: 1 %
HCT: 29.3 % — ABNORMAL LOW (ref 36.0–46.0)
Hemoglobin: 10 g/dL — ABNORMAL LOW (ref 12.0–15.0)
Immature Granulocytes: 1 %
Lymphocytes Relative: 16 %
Lymphs Abs: 0.2 10*3/uL — ABNORMAL LOW (ref 0.7–4.0)
MCH: 34.5 pg — ABNORMAL HIGH (ref 26.0–34.0)
MCHC: 34.1 g/dL (ref 30.0–36.0)
MCV: 101 fL — ABNORMAL HIGH (ref 80.0–100.0)
Monocytes Absolute: 0.2 10*3/uL (ref 0.1–1.0)
Monocytes Relative: 14 %
Neutro Abs: 1 10*3/uL — ABNORMAL LOW (ref 1.7–7.7)
Neutrophils Relative %: 66 %
Platelets: 112 10*3/uL — ABNORMAL LOW (ref 150–400)
RBC: 2.9 MIL/uL — ABNORMAL LOW (ref 3.87–5.11)
RDW: 16.8 % — ABNORMAL HIGH (ref 11.5–15.5)
WBC: 1.5 10*3/uL — ABNORMAL LOW (ref 4.0–10.5)
nRBC: 0 % (ref 0.0–0.2)

## 2021-10-12 LAB — COMPREHENSIVE METABOLIC PANEL
ALT: 16 U/L (ref 0–44)
AST: 27 U/L (ref 15–41)
Albumin: 4.1 g/dL (ref 3.5–5.0)
Alkaline Phosphatase: 79 U/L (ref 38–126)
Anion gap: 8 (ref 5–15)
BUN: 22 mg/dL (ref 8–23)
CO2: 25 mmol/L (ref 22–32)
Calcium: 9.4 mg/dL (ref 8.9–10.3)
Chloride: 103 mmol/L (ref 98–111)
Creatinine, Ser: 1.48 mg/dL — ABNORMAL HIGH (ref 0.44–1.00)
GFR, Estimated: 37 mL/min — ABNORMAL LOW (ref >60)
Glucose, Bld: 112 mg/dL — ABNORMAL HIGH (ref 70–99)
Potassium: 3.6 mmol/L (ref 3.5–5.1)
Sodium: 136 mmol/L (ref 135–145)
Total Bilirubin: 0.7 mg/dL (ref 0.3–1.2)
Total Protein: 7.4 g/dL (ref 6.5–8.1)

## 2021-10-12 MED ORDER — FULVESTRANT 250 MG/5ML IM SOSY
500.0000 mg | PREFILLED_SYRINGE | Freq: Once | INTRAMUSCULAR | Status: AC
  Administered 2021-10-12: 500 mg via INTRAMUSCULAR
  Filled 2021-10-12: qty 10

## 2021-10-13 ENCOUNTER — Encounter: Payer: Self-pay | Admitting: Oncology

## 2021-10-13 LAB — CANCER ANTIGEN 27.29: CA 27.29: 71.8 U/mL — ABNORMAL HIGH (ref 0.0–38.6)

## 2021-10-17 ENCOUNTER — Other Ambulatory Visit: Payer: Self-pay | Admitting: Internal Medicine

## 2021-10-19 ENCOUNTER — Other Ambulatory Visit (HOSPITAL_COMMUNITY): Payer: Self-pay

## 2021-10-26 ENCOUNTER — Other Ambulatory Visit (HOSPITAL_COMMUNITY): Payer: Self-pay

## 2021-10-31 ENCOUNTER — Other Ambulatory Visit (HOSPITAL_COMMUNITY): Payer: Self-pay

## 2021-11-01 ENCOUNTER — Other Ambulatory Visit (HOSPITAL_COMMUNITY): Payer: Self-pay

## 2021-11-02 ENCOUNTER — Other Ambulatory Visit (HOSPITAL_COMMUNITY): Payer: Self-pay

## 2021-11-02 ENCOUNTER — Ambulatory Visit
Admission: RE | Admit: 2021-11-02 | Discharge: 2021-11-02 | Disposition: A | Discharge status: Home / Self Care | Payer: Medicare Other | Source: Ambulatory Visit | Attending: Oncology | Admitting: Oncology

## 2021-11-02 DIAGNOSIS — C7951 Secondary malignant neoplasm of bone: Secondary | ICD-10-CM | POA: Insufficient documentation

## 2021-11-02 DIAGNOSIS — C50919 Malignant neoplasm of unspecified site of unspecified female breast: Secondary | ICD-10-CM | POA: Diagnosis not present

## 2021-11-02 DIAGNOSIS — M25552 Pain in left hip: Secondary | ICD-10-CM | POA: Insufficient documentation

## 2021-11-02 MED ORDER — GADOBUTROL 1 MMOL/ML IV SOLN
10.0000 mL | Freq: Once | INTRAVENOUS | Status: AC | PRN
  Administered 2021-11-02: 10 mL via INTRAVENOUS

## 2021-11-09 ENCOUNTER — Ambulatory Visit: Payer: Medicare Other

## 2021-11-09 ENCOUNTER — Ambulatory Visit: Payer: Medicare Other | Admitting: Oncology

## 2021-11-09 ENCOUNTER — Ambulatory Visit: Payer: Medicare Other | Admitting: Pharmacist

## 2021-11-09 ENCOUNTER — Other Ambulatory Visit: Payer: Medicare Other

## 2021-11-14 ENCOUNTER — Other Ambulatory Visit (HOSPITAL_COMMUNITY): Payer: Self-pay

## 2021-11-14 ENCOUNTER — Encounter: Payer: Self-pay | Admitting: Pharmacist

## 2021-11-14 ENCOUNTER — Inpatient Hospital Stay: Payer: Medicare Other | Admitting: Pharmacist

## 2021-11-14 ENCOUNTER — Inpatient Hospital Stay: Payer: Medicare Other | Attending: Oncology

## 2021-11-14 ENCOUNTER — Inpatient Hospital Stay: Payer: Medicare Other

## 2021-11-14 ENCOUNTER — Inpatient Hospital Stay (HOSPITAL_BASED_OUTPATIENT_CLINIC_OR_DEPARTMENT_OTHER): Payer: Medicare Other | Admitting: Nurse Practitioner

## 2021-11-14 ENCOUNTER — Ambulatory Visit: Payer: Medicare Other | Admitting: Oncology

## 2021-11-14 DIAGNOSIS — Z170 Estrogen receptor positive status [ER+]: Secondary | ICD-10-CM | POA: Insufficient documentation

## 2021-11-14 DIAGNOSIS — C50912 Malignant neoplasm of unspecified site of left female breast: Secondary | ICD-10-CM | POA: Diagnosis not present

## 2021-11-14 DIAGNOSIS — Z9012 Acquired absence of left breast and nipple: Secondary | ICD-10-CM | POA: Insufficient documentation

## 2021-11-14 DIAGNOSIS — Z9221 Personal history of antineoplastic chemotherapy: Secondary | ICD-10-CM | POA: Insufficient documentation

## 2021-11-14 DIAGNOSIS — C50919 Malignant neoplasm of unspecified site of unspecified female breast: Secondary | ICD-10-CM

## 2021-11-14 DIAGNOSIS — Z5111 Encounter for antineoplastic chemotherapy: Secondary | ICD-10-CM | POA: Diagnosis not present

## 2021-11-14 DIAGNOSIS — R5383 Other fatigue: Secondary | ICD-10-CM | POA: Insufficient documentation

## 2021-11-14 DIAGNOSIS — M25552 Pain in left hip: Secondary | ICD-10-CM

## 2021-11-14 DIAGNOSIS — Z923 Personal history of irradiation: Secondary | ICD-10-CM | POA: Diagnosis not present

## 2021-11-14 DIAGNOSIS — C7951 Secondary malignant neoplasm of bone: Secondary | ICD-10-CM | POA: Insufficient documentation

## 2021-11-14 DIAGNOSIS — Z79899 Other long term (current) drug therapy: Secondary | ICD-10-CM | POA: Insufficient documentation

## 2021-11-14 LAB — COMPREHENSIVE METABOLIC PANEL
ALT: 15 U/L (ref 0–44)
AST: 24 U/L (ref 15–41)
Albumin: 4.3 g/dL (ref 3.5–5.0)
Alkaline Phosphatase: 86 U/L (ref 38–126)
Anion gap: 10 (ref 5–15)
BUN: 23 mg/dL (ref 8–23)
CO2: 25 mmol/L (ref 22–32)
Calcium: 10.1 mg/dL (ref 8.9–10.3)
Chloride: 101 mmol/L (ref 98–111)
Creatinine, Ser: 1.58 mg/dL — ABNORMAL HIGH (ref 0.44–1.00)
GFR, Estimated: 35 mL/min — ABNORMAL LOW (ref >60)
Glucose, Bld: 104 mg/dL — ABNORMAL HIGH (ref 70–99)
Potassium: 3.9 mmol/L (ref 3.5–5.1)
Sodium: 136 mmol/L (ref 135–145)
Total Bilirubin: 0.6 mg/dL (ref 0.3–1.2)
Total Protein: 7.9 g/dL (ref 6.5–8.1)

## 2021-11-14 LAB — CBC WITH DIFFERENTIAL/PLATELET
Abs Immature Granulocytes: 0.02 10*3/uL (ref 0.00–0.07)
Basophils Absolute: 0.1 10*3/uL (ref 0.0–0.1)
Basophils Relative: 2 %
Eosinophils Absolute: 0 10*3/uL (ref 0.0–0.5)
Eosinophils Relative: 1 %
HCT: 32 % — ABNORMAL LOW (ref 36.0–46.0)
Hemoglobin: 10.9 g/dL — ABNORMAL LOW (ref 12.0–15.0)
Immature Granulocytes: 1 %
Lymphocytes Relative: 14 %
Lymphs Abs: 0.3 10*3/uL — ABNORMAL LOW (ref 0.7–4.0)
MCH: 35.3 pg — ABNORMAL HIGH (ref 26.0–34.0)
MCHC: 34.1 g/dL (ref 30.0–36.0)
MCV: 103.6 fL — ABNORMAL HIGH (ref 80.0–100.0)
Monocytes Absolute: 0.3 10*3/uL (ref 0.1–1.0)
Monocytes Relative: 16 %
Neutro Abs: 1.5 10*3/uL — ABNORMAL LOW (ref 1.7–7.7)
Neutrophils Relative %: 66 %
Platelets: 219 10*3/uL (ref 150–400)
RBC: 3.09 MIL/uL — ABNORMAL LOW (ref 3.87–5.11)
RDW: 16.2 % — ABNORMAL HIGH (ref 11.5–15.5)
Smear Review: NORMAL
WBC: 2.2 10*3/uL — ABNORMAL LOW (ref 4.0–10.5)
nRBC: 0 % (ref 0.0–0.2)

## 2021-11-14 MED ORDER — PREDNISONE 20 MG PO TABS: 20.0000 mg | ORAL_TABLET | Freq: Every day | ORAL | 0 refills | Status: AC

## 2021-11-14 MED ORDER — FULVESTRANT 250 MG/5ML IM SOSY
500.0000 mg | PREFILLED_SYRINGE | Freq: Once | INTRAMUSCULAR | Status: AC
  Administered 2021-11-14: 500 mg via INTRAMUSCULAR
  Filled 2021-11-14: qty 10

## 2021-11-14 MED ORDER — PALBOCICLIB 125 MG PO TABS
125.0000 mg | ORAL_TABLET | Freq: Every day | ORAL | 3 refills | Status: DC
  Filled 2021-11-14: qty 21, 21d supply, fill #0
  Filled 2021-11-21: qty 21, 28d supply, fill #0
  Filled 2021-12-18 – 2021-12-29 (×2): qty 21, 28d supply, fill #1
  Filled 2022-03-13: qty 21, 28d supply, fill #2
  Filled 2022-04-05: qty 21, 28d supply, fill #3

## 2021-11-14 NOTE — Progress Notes (Signed)
[place]  [phone] [phone]  ID: [name] OB: 09/26/1950  MR#: [id number]  GPQ#:[id number]  Patient Care Team: [name], [name], Harrell Gave, [name] Rico Junker, RN as Registered Nurse Lloyd Huger, MD as Consulting Physician (Oncology) Bary Castilla Forest Gleason, MD as Consulting Physician (General Surgery) Bary Castilla Forest Gleason, MD as Consulting Physician (General Surgery) Noreene Filbert, MD as Referring Physician (Radiation Oncology) Jeral Fruit, RN as Registered Nurse  CHIEF COMPLAINT: Recurrent stage IV ER/PR positive, 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  INTERVAL HISTORY: Patient is 71 year old female with recurrent breast cancer currently on ibrance and faslodex who returns to clinic for further evaluation and consideration of faslodex. Continues to have left hip pain that is bothersome and impairing quality of life and mobility. Jaw pain is stable and she says she is followed closely by oral surgery. She is otherwise at baseline and denies complaints.   REVIEW OF SYSTEMS:   Review of Systems  Constitutional:  Negative for fever, malaise/fatigue and weight loss.  HENT:  Negative for congestion.   Respiratory:  Negative for cough and shortness of breath.   Cardiovascular:  Negative for chest pain, palpitations and leg swelling.  Gastrointestinal:  Negative for abdominal pain, blood in stool, constipation, diarrhea, melena and nausea.  Genitourinary:  Negative for dysuria, flank pain and urgency.  Musculoskeletal:  Positive for joint pain. Negative for back pain and falls.  Skin:  Negative for rash.  Neurological:  Positive for tingling and sensory change. Negative for dizziness, focal weakness, weakness and headaches.  Psychiatric/Behavioral:  Negative for depression. The patient is not nervous/anxious.   As per HPI.  Otherwise, a complete review of systems is negative.   PAST MEDICAL HISTORY: Past Medical History:  Diagnosis Date   Anxiety    Breast cancer, left ([place]) 07/2017   Depression    Hypertension    Hypothyroidism    Personal history of chemotherapy 2019   LEFT mastectomy-chemo before   Personal history of radiation therapy 03/2018   LEFT mastectomy   Rapid heart rate    Thyroid disease     PAST SURGICAL HISTORY: Past Surgical History:  Procedure Laterality Date   AXILLARY LYMPH NODE BIOPSY Left 07/16/2017   METASTATIC MAMMARY CARCINOMA   BREAST BIOPSY Left 07/16/2017   [place] bx of left breast mass and left breast LN.  INVASIVE MAMMARY CARCINOMA, NO SPECIAL TYPE.    BREAST EXCISIONAL BIOPSY Right 2001   benign   BREAST LUMPECTOMY WITH SENTINEL LYMPH NODE BIOPSY Left 12/06/2017   Procedure: BREAST LUMPECTOMY WITH SENTINEL LYMPH NODE BX;  Surgeon: Robert Bellow, MD;  Location: [facility] ORS;  Service: General;  Laterality: Left;   COLONOSCOPY     MASTECTOMY Left 12/23/2017   PORTACATH PLACEMENT Right 08/07/2017   Procedure: INSERTION PORT-A-CATH;  Surgeon: Robert Bellow, MD;  Location: [facility] ORS;  Service: General;  Laterality: Right;   SIMPLE MASTECTOMY WITH AXILLARY SENTINEL NODE BIOPSY Left 12/23/2017   T2,N2 with 6/7 nodes positive. Whole breast radiation.  Surgeon: Robert Bellow, MD;  Location: [facility] ORS;  Service: General;  Laterality: Left;   TONSILLECTOMY      FAMILY HISTORY: Family History  Problem Relation Age of Onset   Stroke Mother    Thyroid disease Mother    Renal Disease Mother    Stroke Father    Heart attack Father    Sudden death Father 38  suicide   Anuerysm Brother    Breast cancer Neg Hx     ADVANCED DIRECTIVES (Y/N):  N  HEALTH MAINTENANCE: Social History   Tobacco Use   Smoking status: Former    Packs/day: 1.00    Years: 30.00    Total pack years: 30.00    Types: Cigarettes    Quit date: 2019    Years since quitting: 4.8    Smokeless tobacco: Never  Vaping Use   Vaping Use: Never used  Substance Use Topics   Alcohol use: Not Currently   Drug use: Never     Colonoscopy:  PAP:  Bone density:  Lipid panel:  Allergies  Allergen Reactions   Sulfa Antibiotics Diarrhea    Current Outpatient Medications  Medication Sig Dispense Refill   acetaminophen (TYLENOL) 500 MG tablet Take 1,000 mg by mouth every 6 (six) hours as needed for moderate pain or headache.      ALPRAZolam (XANAX) 0.5 MG tablet Take 1 tablet (0.5 mg total) by mouth 2 (two) times daily. Taking once/day 60 tablet 0   amLODipine (NORVASC) 5 MG tablet Take 1 tablet (5 mg total) by mouth daily. 90 tablet 3   aspirin EC 81 MG tablet Take 81 mg by mouth at bedtime.      calcium carbonate (OS-CAL - DOSED IN MG OF ELEMENTAL CALCIUM) 1250 (500 Ca) MG tablet Take 1 tablet by mouth daily with breakfast.     chlorhexidine (PERIDEX) 0.12 % solution SMARTSIG:By Mouth     Cholecalciferol (VITAMIN D3) 125 MCG (5000 UT) CAPS Take 1 capsule by mouth 2 (two) times a day.     docusate sodium (COLACE) 100 MG capsule Take 100 mg by mouth 3 (three) times daily as needed for mild constipation.     Erythromycin 500 MG TBEC Take 1 tablet by mouth 3 (three) times daily.     hydrochlorothiazide (HYDRODIURIL) 12.5 MG tablet Take 1 tablet (12.5 mg total) by mouth daily. 90 tablet 3   levothyroxine (SYNTHROID) 100 MCG tablet TAKE 1 TABLET BY MOUTH DAILY 90 tablet 3   nystatin-lidocaine-prednisoLONE-diphenhydrAMINE-distilled water-alum & mag hydroxide-simeth Swish and spit 5-45m four times a day as needed 480 mL 3   olopatadine (PATANOL) 0.1 % ophthalmic solution Place 1 drop into the right eye 2 (two) times daily. 5 mL 12   palbociclib (IBRANCE) 125 MG tablet Take 1 tablet (125 mg total) by mouth daily. Take for 21 days, then hold for 7 days. Repeat every 28 days. 21 tablet 3   potassium chloride SA (KLOR-CON M) 20 MEQ tablet TAKE 1 TABLET BY MOUTH TWICE DAILY 60 tablet 3    PREVIDENT 5000 PLUS 1.1 % CREA dental cream SMARTSIG:Application By Mouth     Probiotic Product (ALIGN PO) Take by mouth.     rosuvastatin (CRESTOR) 5 MG tablet Take 1 tablet (5 mg total) by mouth daily. 90 tablet 3   traMADol (ULTRAM) 50 MG tablet Take 1 tablet (50 mg total) by mouth every 6 (six) hours as needed. 60 tablet 0   traZODone (DESYREL) 50 MG tablet TAKE 1 TABLET BY MOUTH DAILY 30 tablet 6   triamcinolone cream (KENALOG) 0.1 % Apply 1 application. topically 2 (two) times daily. 30 g 0   No current facility-administered medications for this visit.   Facility-Administered Medications Ordered in Other Visits  Medication Dose Route Frequency Provider Last Rate Last Admin   fulvestrant (FASLODEX) injection 500 mg  500 mg Intramuscular Once FLloyd Huger MD  OBJECTIVE: There were no vitals filed for this visit.    There is no height or weight on file to calculate BMI.    ECOG FS:2 - Symptomatic, <50% confined to bed  General: Well-developed, well-nourished, no acute distress. Eyes: Pink conjunctiva, anicteric sclera. HEENT: Normocephalic, moist mucous membranes. Lungs: No audible wheezing or coughing. Heart: Regular rate and rhythm. Abdomen: Soft, nontender, no obvious distention. Musculoskeletal: No edema, cyanosis, or clubbing. Neuro: Alert, answering all questions appropriately. Cranial nerves grossly intact. Skin: No rashes or petechiae noted. Psych: Normal affect.  LAB RESULTS:  Lab Results  Component Value Date   NA 136 11/14/2021   K 3.9 11/14/2021   CL 101 11/14/2021   CO2 25 11/14/2021   GLUCOSE 104 (H) 11/14/2021   BUN 23 11/14/2021   CREATININE 1.58 (H) 11/14/2021   CALCIUM 10.1 11/14/2021   PROT 7.9 11/14/2021   ALBUMIN 4.3 11/14/2021   AST 24 11/14/2021   ALT 15 11/14/2021   ALKPHOS 86 11/14/2021   BILITOT 0.6 11/14/2021   GFRNONAA 35 (L) 11/14/2021   GFRAA 57 (L) 10/01/2019    Lab Results  Component Value Date   WBC 2.2 (L)  11/14/2021   NEUTROABS 1.5 (L) 11/14/2021   HGB 10.9 (L) 11/14/2021   HCT 32.0 (L) 11/14/2021   MCV 103.6 (H) 11/14/2021   PLT 219 11/14/2021     STUDIES: MR HIP LEFT W WO CONTRAST  Result Date: 11/05/2021 CLINICAL DATA:  Left hip pain. History of metastatic breast cancer with left hip radiation. EXAM: MRI OF THE LEFT HIP WITHOUT AND WITH CONTRAST TECHNIQUE: Multiplanar, multisequence MR imaging was performed both before and after administration of intravenous contrast. CONTRAST:  28m GADAVIST GADOBUTROL 1 MMOL/ML IV SOLN COMPARISON:  PET-CT 10/02/2021 and 03/02/2021. FINDINGS: Bones: A known large heterogeneous metastasis within the left iliac wing measures approximately 8.0 x 4.7 x 2.8 cm. This demonstrates heterogeneous T2 hyperintensity and heterogeneous predominately peripheral enhancement following contrast. There is mild extraosseous extension of tumor, greatest laterally. The lesion does not appear significantly changed in size from the most recent PET-CT allowing for modality differences. Multiple other osseous metastases are present, including a 1.8 cm lesion posteriorly in the right iliac bone on image 6/4, a 2.1 cm right superior acetabular lesion on image 22/4 and a 1.7 cm lesion in the right ischium on image 36/4. No evidence of proximal femoral metastasis or impending pathologic fracture. Articular cartilage and labrum Articular cartilage:  Mild degenerative changes of both hips. Labrum: There is no gross labral tear or paralabral abnormality. Joint or bursal effusion Joint effusion: No significant hip joint effusion. Bursae: No focal periarticular fluid collection. Muscles and tendons Muscles and tendons: There is edema within the left iliacus and gluteus musculature adjacent to the treated lesion in the left iliac bone. The gluteus tendons appear intact. Mild common hamstring tendinosis, right greater than left. The iliopsoas tendons appear intact. No intramuscular masses are  demonstrated. Other findings Miscellaneous: Soft tissue edema surrounding the treated left iliac lesion without focal fluid collection. IMPRESSION: 1. The dominant osseous metastasis in the left iliac wing appears similar to the most recent PET-CT allowing for modality differences. There is mild extraosseous extension of tumor, greatest laterally, and residual contrast enhancement suggesting viability based on residual metabolic activity on PET-CT. 2. Multiple other osseous metastases are present in the right iliac bone, right superior acetabulum and right ischium. No evidence of proximal femoral metastasis or impending pathologic fracture. 3. Mild edema within the left iliacus and gluteus  musculature adjacent to the treated left iliac lesion. No focal fluid collection. 4. Mild common hamstring tendinosis, right greater than left. Electronically Signed   By: Richardean Sale M.D.   On: 11/05/2021 12:01     ONCOLOGY HISTORY: Patient initially received neoadjuvant chemotherapy with Adriamycin and Cytoxan followed by weekly Taxol. She only received 4 cycles of weekly Taxol prior to discontinuation of treatment on October 31, 2017 secondary to persistent peripheral neuropathy.  She ultimately required mastectomy and final pathology noted 5 of 6 lymph nodes positive for disease.  Patient completed adjuvant XRT in mid April 2020.  Pain initiated letrozole in May 2020, this was discontinued secondary to side effects and patient was started on anastrozole.  Nuclear medicine bone scan on June 19, 2019 revealed metastatic disease and anastrozole was subsequently discontinued.  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 PET 06/30/19 revealed metastatic bone disease but no obvious visceral disease. MRI of the brain on 08/24/19 was negative for metastatic disease. Repeat PET 10/02/21 revealed improvement in disease burden.   PLAN:    1. Recurrent stage IV ER/PR  positive, HER-2 negative invasive carcinoma with bony metastasis: Currently on ibrance and faslodex. CA 27.29 is roughly stable over past 6 months, today 76. Tolerating ibrance and faslodex well overall however, persistent neutropenia, anemia, and thrombocytopenia. Labs reviewed and acceptable for continuation of treatment. Proceed with faslodex today. She will see clinical pharmacist today as well.   2.  Peripheral neuropathy: Chronic and unchanged.  3.  History of pulmonary embolus: Patient was diagnosed with a small pulmonary embolus on January 10, 2018.  She is no longer on anticoagulation.  There was no evidence of recurrent PE on CT scan from October 27, 2019. Clinically, asymptomatic. Monitor closely.   4.  Osteopenia: Patient's bone mineral density on March 03, 2019 reported a T score of -1.8 which is only mildly decreased from 1 year prior when the reported T score was -1.6.  Continue calcium and vitamin D supplementation.  Continue to hold Xgeva d/t dental issues.   5.  Left hip/flank pain: Patient was evaluated by radiation oncology who determined that no further XRT is possible.  Concern for possible fracture, therefore MRI was obtained. I independently reviewed MRI and discussed results with patient - no evidence of fracture however, persistent disease. Multiple other areas of osseous metastatic disease, mild edema of lfet ilacus and gluteus musculature. Given worsening pain, will do short course of steroids. I spoke to Dr. Denna Haggard with IR who will evaluate patient for possible RFA vs cryoablation.   6.  Leukopenia: wbc today improved to 2.2, ANC  1.5. Monitor. This has ranged between 1.5 and 3.5 since July 2021.   7.  Anemia: Chronic and unchanged.  Patient's hemoglobin is 10.9 today.  8.  Renal insufficiency: Chronic and unchanged.  Patient's creatinine is 1.58. Encouraged hydration.   9.  Hypokalemia: Resolved.  Continue oral supplementation as prescribed.    10.  UTI:  Resolved.  11.  Dental pathology: Per patient, slightly worse. Xgeva discontinued. Continue follow with Dr. Hampton Abbot.  12.  Thrombocytopenia: Chronic and unchanged.  Patient platelet count is 219 today.  Proceed with treatment as above.  Disposition:  1 mo- labs, Dr Grayland Ormond & Clearnce Sorrel, +/- faslodex- [place]  Patient expressed understanding and was in agreement with this plan. She also understands that She can call clinic at any time with any questions, concerns, or complaints.    Cancer Staging  Breast cancer,  stage 2, left ([place]) Staging form: Breast, AJCC 8th Edition - Clinical stage from 07/30/2017: Stage IIA (cT2, cN1, cM0, G2, ER+, PR+, HER2-) - Signed by Lloyd Huger, MD on 07/30/2017 Histologic grading system: 3 grade system Laterality: Left  Verlon Au, NP   11/14/2021

## 2021-11-14 NOTE — Progress Notes (Signed)
[place]  [phone] [phone]  Patient Care Team: [name], [name], Harrell Gave, [name] Rico Junker, RN as Registered Nurse Grayland Ormond Kathlene November, MD as Consulting Physician (Oncology) Bary Castilla Forest Gleason, MD as Consulting Physician (General Surgery) Bary Castilla Forest Gleason, MD as Consulting Physician (General Surgery) Noreene Filbert, MD as Referring Physician (Radiation Oncology) Jeral Fruit, RN as Registered Nurse   Name of the patient: Alicia [name]  [id number]  Mar 07, 1950   Date of visit: 11/14/2021  HPI: Patient is a 71 y.o. female with recurrent stage IV ER/PR positive, HER2 negative breast cancer. Patient started on Ibrance on 07/09/19.   Reason for Consult: Oral chemotherapy follow-up for Ibrance (palbociclib) therapy.   PAST MEDICAL HISTORY: Past Medical History:  Diagnosis Date   Anxiety    Breast cancer, left ([place]) 07/2017   Depression    Hypertension    Hypothyroidism    Personal history of chemotherapy 2019   LEFT mastectomy-chemo before   Personal history of radiation therapy 03/2018   LEFT mastectomy   Rapid heart rate    Thyroid disease     PAST SURGICAL HISTORY:  Past Surgical History:  Procedure Laterality Date   AXILLARY LYMPH NODE BIOPSY Left 07/16/2017   METASTATIC MAMMARY CARCINOMA   BREAST BIOPSY Left 07/16/2017   [place] bx of left breast mass and left breast LN.  INVASIVE MAMMARY CARCINOMA, NO SPECIAL TYPE.    BREAST EXCISIONAL BIOPSY Right 2001   benign   BREAST LUMPECTOMY WITH SENTINEL LYMPH NODE BIOPSY Left 12/06/2017   Procedure: BREAST LUMPECTOMY WITH SENTINEL LYMPH NODE BX;  Surgeon: Robert Bellow, MD;  Location: [facility] ORS;  Service: General;  Laterality: Left;   COLONOSCOPY     MASTECTOMY Left 12/23/2017   PORTACATH PLACEMENT Right 08/07/2017   Procedure: INSERTION PORT-A-CATH;  Surgeon:  Robert Bellow, MD;  Location: [facility] ORS;  Service: General;  Laterality: Right;   SIMPLE MASTECTOMY WITH AXILLARY SENTINEL NODE BIOPSY Left 12/23/2017   T2,N2 with 6/7 nodes positive. Whole breast radiation.  Surgeon: Robert Bellow, MD;  Location: [facility] ORS;  Service: General;  Laterality: Left;   TONSILLECTOMY      HEMATOLOGY/ONCOLOGY HISTORY:  Oncology History Overview Note  Patient is a 71 year old female who recently self palpated a mass on her left breast.  Subsequent imaging and biopsy revealed the above-stated breast cancer.  Case was also discussed extensively at case conference.  Given the size and the stage of patient's malignancy, she will benefit from neoadjuvant chemotherapy using Adriamycin, Cytoxan, and Taxol.  Patient will also require Neulasta support.  Will get CT scan of the chest, abdomen, and pelvis to assess for any metastatic disease.  Patient will also require port placement and MUGA prior to initiating treatment  CT abdomen/pelvis/chest did not reveal any suspicious lesions concerning for metastatic disease. (08/01/17)  Port-A-Cath placed on 08/07/2017.  Cycle 1 day 1 of AC was given on 08/08/17.     Breast cancer, stage 2, left ([place])  07/24/2017 Initial Diagnosis   Breast cancer, stage 2, left ([place])   07/30/2017 Cancer Staging   Staging form: Breast, AJCC 8th Edition - Clinical stage from 07/30/2017: Stage IIA (cT2, cN1, cM0, G2, ER+, PR+, HER2-) - Signed by Lloyd Huger, MD on 07/30/2017   08/08/2017 - 10/31/2017 Chemotherapy   The patient had DOXOrubicin (ADRIAMYCIN) chemo injection 130 mg, 60 mg/m2 = 130 mg, Intravenous,  Once, 4  of 4 cycles Administration: 130 mg (08/08/2017), 130 mg (08/22/2017), 130 mg (09/05/2017), 130 mg (09/19/2017) palonosetron (ALOXI) injection 0.25 mg, 0.25 mg, Intravenous,  Once, 4 of 4 cycles Administration: 0.25 mg (08/08/2017), 0.25 mg (08/22/2017), 0.25 mg (09/05/2017), 0.25 mg (09/19/2017) pegfilgrastim-cbqv (UDENYCA) injection 6  mg, 6 mg, Subcutaneous, Once, 4 of 4 cycles Administration: 6 mg (08/09/2017), 6 mg (08/23/2017), 6 mg (09/06/2017), 6 mg (09/20/2017) cyclophosphamide (CYTOXAN) 1,300 mg in sodium chloride 0.9 % 250 mL chemo infusion, 600 mg/m2 = 1,300 mg, Intravenous,  Once, 4 of 4 cycles Administration: 1,300 mg (08/08/2017), 1,300 mg (08/22/2017), 1,300 mg (09/05/2017), 1,300 mg (09/19/2017) PACLitaxel (TAXOL) 174 mg in sodium chloride 0.9 % 250 mL chemo infusion (</= 15m/m2), 80 mg/m2 = 174 mg, Intravenous,  Once, 4 of 12 cycles Dose modification: 72 mg/m2 (original dose 80 mg/m2, Cycle 6, Reason: Dose not tolerated) Administration: 174 mg (10/03/2017), 156 mg (10/17/2017), 156 mg (10/24/2017), 156 mg (10/31/2017) fosaprepitant (EMEND) 150 mg, dexamethasone (DECADRON) 12 mg in sodium chloride 0.9 % 145 mL IVPB, , Intravenous,  Once, 4 of 4 cycles Administration:  (08/08/2017),  (08/22/2017),  (09/05/2017),  (09/19/2017)  for chemotherapy treatment.      ALLERGIES:  is allergic to sulfa antibiotics.  MEDICATIONS:  Current Outpatient Medications  Medication Sig Dispense Refill   acetaminophen (TYLENOL) 500 MG tablet Take 1,000 mg by mouth every 6 (six) hours as needed for moderate pain or headache.      ALPRAZolam (XANAX) 0.5 MG tablet Take 1 tablet (0.5 mg total) by mouth 2 (two) times daily. Taking once/day 60 tablet 0   amLODipine (NORVASC) 5 MG tablet Take 1 tablet (5 mg total) by mouth daily. 90 tablet 3   aspirin EC 81 MG tablet Take 81 mg by mouth at bedtime.      calcium carbonate (OS-CAL - DOSED IN MG OF ELEMENTAL CALCIUM) 1250 (500 Ca) MG tablet Take 1 tablet by mouth daily with breakfast.     chlorhexidine (PERIDEX) 0.12 % solution SMARTSIG:By Mouth     Cholecalciferol (VITAMIN D3) 125 MCG (5000 UT) CAPS Take 1 capsule by mouth 2 (two) times a day.     docusate sodium (COLACE) 100 MG capsule Take 100 mg by mouth 3 (three) times daily as needed for mild constipation.     Erythromycin 500 MG TBEC Take 1 tablet by  mouth 3 (three) times daily.     hydrochlorothiazide (HYDRODIURIL) 12.5 MG tablet Take 1 tablet (12.5 mg total) by mouth daily. 90 tablet 3   levothyroxine (SYNTHROID) 100 MCG tablet TAKE 1 TABLET BY MOUTH DAILY 90 tablet 3   nystatin-lidocaine-prednisoLONE-diphenhydrAMINE-distilled water-alum & mag hydroxide-simeth Swish and spit 5-1106mfour times a day as needed 480 mL 3   olopatadine (PATANOL) 0.1 % ophthalmic solution Place 1 drop into the right eye 2 (two) times daily. 5 mL 12   palbociclib (IBRANCE) 125 MG tablet Take 1 tablet (125 mg total) by mouth daily. Take for 21 days, then hold for 7 days. Repeat every 28 days. 21 tablet 3   potassium chloride SA (KLOR-CON M) 20 MEQ tablet TAKE 1 TABLET BY MOUTH TWICE DAILY 60 tablet 3   PREVIDENT 5000 PLUS 1.1 % CREA dental cream SMARTSIG:Application By Mouth     Probiotic Product (ALIGN PO) Take by mouth.     rosuvastatin (CRESTOR) 5 MG tablet Take 1 tablet (5 mg total) by mouth daily. 90 tablet 3   traMADol (ULTRAM) 50 MG tablet Take 1 tablet (50 mg total) by mouth every  6 (six) hours as needed. 60 tablet 0   traZODone (DESYREL) 50 MG tablet TAKE 1 TABLET BY MOUTH DAILY 30 tablet 6   triamcinolone cream (KENALOG) 0.1 % Apply 1 application. topically 2 (two) times daily. 30 g 0   predniSONE (DELTASONE) 20 MG tablet Take 1 tablet (20 mg total) by mouth daily with breakfast for 5 days. 5 tablet 0   No current facility-administered medications for this visit.    VITAL SIGNS: BP (!) 99/53 (BP Location: Right Arm, Patient Position: Sitting, Cuff Size: Large)   Pulse 66   Temp (!) 96.5 F (35.8 C) (Tympanic)   Resp 16   Ht _0  (1.676 m)   Wt 98.4 kg (217 lb)   SpO2 100%   BMI 35.02 kg/m  Filed Weights   11/14/21 1307  Weight: 98.4 kg (217 lb)     Estimated body mass index is 35.02 kg/m as calculated from the following:   Height as of this encounter: _1  (1.676 m).   Weight as of this encounter: 98.4 kg (217 lb).  LABS: CBC:     Component Value Date/Time   WBC 2.2 (L) 11/14/2021 1224   HGB 10.9 (L) 11/14/2021 1224   HGB 14.2 02/11/2011 1349   HCT 32.0 (L) 11/14/2021 1224   HCT 40.9 02/11/2011 1349   PLT 219 11/14/2021 1224   PLT 151 02/11/2011 1349   MCV 103.6 (H) 11/14/2021 1224   MCV 89 02/11/2011 1349   NEUTROABS 1.5 (L) 11/14/2021 1224   LYMPHSABS 0.3 (L) 11/14/2021 1224   MONOABS 0.3 11/14/2021 1224   EOSABS 0.0 11/14/2021 1224   BASOSABS 0.1 11/14/2021 1224   Comprehensive Metabolic Panel:    Component Value Date/Time   NA 136 11/14/2021 1224   NA 142 02/11/2011 1349   K 3.9 11/14/2021 1224   K 3.8 02/11/2011 1349   CL 101 11/14/2021 1224   CL 107 02/11/2011 1349   CO2 25 11/14/2021 1224   CO2 24 02/11/2011 1349   BUN 23 11/14/2021 1224   BUN 14 02/11/2011 1349   CREATININE 1.58 (H) 11/14/2021 1224   CREATININE 0.65 02/11/2011 1349   GLUCOSE 104 (H) 11/14/2021 1224   GLUCOSE 98 02/11/2011 1349   CALCIUM 10.1 11/14/2021 1224   CALCIUM 9.0 02/11/2011 1349   AST 24 11/14/2021 1224   AST 27 02/11/2011 1349   ALT 15 11/14/2021 1224   ALT 36 02/11/2011 1349   ALKPHOS 86 11/14/2021 1224   ALKPHOS 122 02/11/2011 1349   BILITOT 0.6 11/14/2021 1224   BILITOT 0.5 02/11/2011 1349   PROT 7.9 11/14/2021 1224   PROT 7.0 02/11/2011 1349   ALBUMIN 4.3 11/14/2021 1224   ALBUMIN 4.1 02/11/2011 1349    RADIOGRAPHIC STUDIES: MR HIP LEFT W WO CONTRAST  Result Date: 11/05/2021 CLINICAL DATA:  Left hip pain. History of metastatic breast cancer with left hip radiation. EXAM: MRI OF THE LEFT HIP WITHOUT AND WITH CONTRAST TECHNIQUE: Multiplanar, multisequence MR imaging was performed both before and after administration of intravenous contrast. CONTRAST:  74m GADAVIST GADOBUTROL 1 MMOL/ML IV SOLN COMPARISON:  PET-CT 10/02/2021 and 03/02/2021. FINDINGS: Bones: A known large heterogeneous metastasis within the left iliac wing measures approximately 8.0 x 4.7 x 2.8 cm. This demonstrates heterogeneous T2  hyperintensity and heterogeneous predominately peripheral enhancement following contrast. There is mild extraosseous extension of tumor, greatest laterally. The lesion does not appear significantly changed in size from the most recent PET-CT allowing for modality differences. Multiple other osseous metastases are  present, including a 1.8 cm lesion posteriorly in the right iliac bone on image 6/4, a 2.1 cm right superior acetabular lesion on image 22/4 and a 1.7 cm lesion in the right ischium on image 36/4. No evidence of proximal femoral metastasis or impending pathologic fracture. Articular cartilage and labrum Articular cartilage:  Mild degenerative changes of both hips. Labrum: There is no gross labral tear or paralabral abnormality. Joint or bursal effusion Joint effusion: No significant hip joint effusion. Bursae: No focal periarticular fluid collection. Muscles and tendons Muscles and tendons: There is edema within the left iliacus and gluteus musculature adjacent to the treated lesion in the left iliac bone. The gluteus tendons appear intact. Mild common hamstring tendinosis, right greater than left. The iliopsoas tendons appear intact. No intramuscular masses are demonstrated. Other findings Miscellaneous: Soft tissue edema surrounding the treated left iliac lesion without focal fluid collection. IMPRESSION: 1. The dominant osseous metastasis in the left iliac wing appears similar to the most recent PET-CT allowing for modality differences. There is mild extraosseous extension of tumor, greatest laterally, and residual contrast enhancement suggesting viability based on residual metabolic activity on PET-CT. 2. Multiple other osseous metastases are present in the right iliac bone, right superior acetabulum and right ischium. No evidence of proximal femoral metastasis or impending pathologic fracture. 3. Mild edema within the left iliacus and gluteus musculature adjacent to the treated left iliac lesion. No  focal fluid collection. 4. Mild common hamstring tendinosis, right greater than left. Electronically Signed   By: Richardean Sale M.D.   On: 11/05/2021 12:01     Assessment and Plan-  Reviewed CBC and CMP, continue palbociclib 139m 21on/7off Dental issue  Continue to hold Xgeva, not yet cleared by dentist to resume treatment.  They recently told her they thought they would have to pull a second tooth, no official determination yet, neither of her dentist have reach out to the office Fatigue: unchanged since last visit, she reports having good days and bad days No other reported side effects Patient saw Lauren, NP today to discuss MRI results  Oral Chemotherapy Adherence: No reported missed doses  New medications: none, reported  Medication Access Issues: No issues[place]  Other: Patient received paper work for [employer] asking her to confirm her income for continued assistance. Patient met with BSuezanne Jacquet(patient advocate) today who helped her complete the paperwork and mailed it back to [employer]  Patient expressed understanding and was in agreement with this plan. She also understands that She can call clinic at any time with any questions, concerns, or complaints.   Follow-up plan: RTC in 4 weeks  Thank you for allowing me to participate in the care of this very pleasant patient.   Time Total: 15 mins  Visit consisted of counseling and education on dealing with issues of symptom management in the setting of serious and potentially life-threatening illness.Greater than 50%  of this time was spent counseling and coordinating care related to the above assessment and plan.  Signed by: ADarl Pikes PharmD, BCPS, BSalley Slaughter CPP Hematology/Oncology Clinical Pharmacist Practitioner [phone] 11/14/2021 4:04 PM

## 2021-11-15 LAB — CANCER ANTIGEN 27.29: CA 27.29: 76 U/mL — ABNORMAL HIGH (ref 0.0–38.6)

## 2021-11-21 ENCOUNTER — Other Ambulatory Visit: Payer: Self-pay | Admitting: Pharmacist

## 2021-11-21 ENCOUNTER — Other Ambulatory Visit (HOSPITAL_COMMUNITY): Payer: Self-pay

## 2021-11-21 DIAGNOSIS — C50912 Malignant neoplasm of unspecified site of left female breast: Secondary | ICD-10-CM

## 2021-11-24 ENCOUNTER — Other Ambulatory Visit: Payer: Self-pay | Admitting: Internal Medicine

## 2021-11-24 DIAGNOSIS — F419 Anxiety disorder, unspecified: Secondary | ICD-10-CM

## 2021-11-28 ENCOUNTER — Other Ambulatory Visit (HOSPITAL_COMMUNITY): Payer: Self-pay

## 2021-12-08 ENCOUNTER — Other Ambulatory Visit (HOSPITAL_COMMUNITY): Payer: Self-pay

## 2021-12-12 ENCOUNTER — Inpatient Hospital Stay: Payer: Medicare Other | Attending: Oncology

## 2021-12-12 ENCOUNTER — Inpatient Hospital Stay: Payer: Medicare Other

## 2021-12-12 ENCOUNTER — Telehealth: Payer: Self-pay

## 2021-12-12 ENCOUNTER — Encounter: Payer: Self-pay | Admitting: Oncology

## 2021-12-12 ENCOUNTER — Other Ambulatory Visit (HOSPITAL_BASED_OUTPATIENT_CLINIC_OR_DEPARTMENT_OTHER): Payer: Self-pay

## 2021-12-12 ENCOUNTER — Inpatient Hospital Stay (HOSPITAL_BASED_OUTPATIENT_CLINIC_OR_DEPARTMENT_OTHER): Payer: Medicare Other | Admitting: Oncology

## 2021-12-12 ENCOUNTER — Inpatient Hospital Stay: Payer: Medicare Other | Admitting: Pharmacist

## 2021-12-12 ENCOUNTER — Other Ambulatory Visit (HOSPITAL_COMMUNITY): Payer: Self-pay

## 2021-12-12 VITALS — BP 125/68 | HR 69 | Temp 98.6°F | Resp 16 | Ht 66.0 in | Wt 220.0 lb

## 2021-12-12 DIAGNOSIS — Z79899 Other long term (current) drug therapy: Secondary | ICD-10-CM | POA: Diagnosis not present

## 2021-12-12 DIAGNOSIS — Z5111 Encounter for antineoplastic chemotherapy: Secondary | ICD-10-CM | POA: Diagnosis not present

## 2021-12-12 DIAGNOSIS — C50919 Malignant neoplasm of unspecified site of unspecified female breast: Secondary | ICD-10-CM | POA: Diagnosis not present

## 2021-12-12 DIAGNOSIS — C50912 Malignant neoplasm of unspecified site of left female breast: Secondary | ICD-10-CM | POA: Insufficient documentation

## 2021-12-12 DIAGNOSIS — C7951 Secondary malignant neoplasm of bone: Secondary | ICD-10-CM

## 2021-12-12 LAB — CBC WITH DIFFERENTIAL/PLATELET
Abs Immature Granulocytes: 0.01 10*3/uL (ref 0.00–0.07)
Basophils Absolute: 0 10*3/uL (ref 0.0–0.1)
Basophils Relative: 2 %
Eosinophils Absolute: 0 10*3/uL (ref 0.0–0.5)
Eosinophils Relative: 1 %
HCT: 28.9 % — ABNORMAL LOW (ref 36.0–46.0)
Hemoglobin: 9.9 g/dL — ABNORMAL LOW (ref 12.0–15.0)
Immature Granulocytes: 1 %
Lymphocytes Relative: 14 %
Lymphs Abs: 0.3 10*3/uL — ABNORMAL LOW (ref 0.7–4.0)
MCH: 36 pg — ABNORMAL HIGH (ref 26.0–34.0)
MCHC: 34.3 g/dL (ref 30.0–36.0)
MCV: 105.1 fL — ABNORMAL HIGH (ref 80.0–100.0)
Monocytes Absolute: 0.4 10*3/uL (ref 0.1–1.0)
Monocytes Relative: 21 %
Neutro Abs: 1.3 10*3/uL — ABNORMAL LOW (ref 1.7–7.7)
Neutrophils Relative %: 61 %
Platelets: 142 10*3/uL — ABNORMAL LOW (ref 150–400)
RBC: 2.75 MIL/uL — ABNORMAL LOW (ref 3.87–5.11)
RDW: 15.2 % (ref 11.5–15.5)
Smear Review: NORMAL
WBC: 2.1 10*3/uL — ABNORMAL LOW (ref 4.0–10.5)
nRBC: 0 % (ref 0.0–0.2)

## 2021-12-12 LAB — COMPREHENSIVE METABOLIC PANEL
ALT: 15 U/L (ref 0–44)
AST: 22 U/L (ref 15–41)
Albumin: 4 g/dL (ref 3.5–5.0)
Alkaline Phosphatase: 92 U/L (ref 38–126)
Anion gap: 9 (ref 5–15)
BUN: 24 mg/dL — ABNORMAL HIGH (ref 8–23)
CO2: 25 mmol/L (ref 22–32)
Calcium: 9.5 mg/dL (ref 8.9–10.3)
Chloride: 99 mmol/L (ref 98–111)
Creatinine, Ser: 1.67 mg/dL — ABNORMAL HIGH (ref 0.44–1.00)
GFR, Estimated: 32 mL/min — ABNORMAL LOW (ref >60)
Glucose, Bld: 105 mg/dL — ABNORMAL HIGH (ref 70–99)
Potassium: 3.9 mmol/L (ref 3.5–5.1)
Sodium: 133 mmol/L — ABNORMAL LOW (ref 135–145)
Total Bilirubin: 0.7 mg/dL (ref 0.3–1.2)
Total Protein: 7.6 g/dL (ref 6.5–8.1)

## 2021-12-12 MED ORDER — FULVESTRANT 250 MG/5ML IM SOSY
500.0000 mg | PREFILLED_SYRINGE | Freq: Once | INTRAMUSCULAR | Status: AC
  Administered 2021-12-12: 500 mg via INTRAMUSCULAR

## 2021-12-12 NOTE — Progress Notes (Signed)
[place]  Telephone:(336) [phone] [phone]  ID: Alicia [name] OB: Jan 17, 1950  MR#: [id number]  WHQ#:[id number]  Patient Care Team: [name], [name], Harrell Gave, [name] Rico Junker, RN as Registered Nurse Lloyd Huger, MD as Consulting Physician (Oncology) Bary Castilla Forest Gleason, MD as Consulting Physician (General Surgery) Bary Castilla Forest Gleason, MD as Consulting Physician (General Surgery) Noreene Filbert, MD as Referring Physician (Radiation Oncology) Jeral Fruit, RN as Registered Nurse   CHIEF COMPLAINT: Recurrent stage IV ER/PR positive, 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  INTERVAL HISTORY: Patient returns to clinic today for further evaluation and continuation of Ibrance and Faslodex.  Her jaw pain and swelling is worse over the past week.  Her left hip pain is chronic and unchanged.  She is followed closely by an oral surgeon.  She does not complain of weakness or fatigue today.  She continues to have a mild peripheral neuropathy, but no other neurologic complaints.  She denies any chest pain, shortness of breath, cough, or hemoptysis.  She denies any nausea, vomiting, constipation, or diarrhea.  She has no urinary complaints.  Patient offers no further specific complaints today.  REVIEW OF SYSTEMS:   Review of Systems  Constitutional: Negative.  Negative for fever, malaise/fatigue and weight loss.  HENT:  Negative for congestion.   Respiratory: Negative.  Negative for cough and shortness of breath.   Cardiovascular: Negative.  Negative for chest pain and leg swelling.  Gastrointestinal: Negative.  Negative for abdominal pain, constipation, diarrhea and nausea.  Genitourinary: Negative.  Negative for dysuria, flank pain and urgency.  Musculoskeletal:  Positive for joint pain. Negative for back pain.  Skin: Negative.  Negative for rash.   Neurological:  Positive for tingling and sensory change. Negative for dizziness, focal weakness, weakness and headaches.  Psychiatric/Behavioral:  The patient is not nervous/anxious.     As per HPI. Otherwise, a complete review of systems is negative.  PAST MEDICAL HISTORY: Past Medical History:  Diagnosis Date   Anxiety    Breast cancer, left ([place]) 07/2017   Depression    Hypertension    Hypothyroidism    Personal history of chemotherapy 2019   LEFT mastectomy-chemo before   Personal history of radiation therapy 03/2018   LEFT mastectomy   Rapid heart rate    Thyroid disease     PAST SURGICAL HISTORY: Past Surgical History:  Procedure Laterality Date   AXILLARY LYMPH NODE BIOPSY Left 07/16/2017   METASTATIC MAMMARY CARCINOMA   BREAST BIOPSY Left 07/16/2017   [place] bx of left breast mass and left breast LN.  INVASIVE MAMMARY CARCINOMA, NO SPECIAL TYPE.    BREAST EXCISIONAL BIOPSY Right 2001   benign   BREAST LUMPECTOMY WITH SENTINEL LYMPH NODE BIOPSY Left 12/06/2017   Procedure: BREAST LUMPECTOMY WITH SENTINEL LYMPH NODE BX;  Surgeon: Robert Bellow, MD;  Location: [facility] ORS;  Service: General;  Laterality: Left;   COLONOSCOPY     MASTECTOMY Left 12/23/2017   PORTACATH PLACEMENT Right 08/07/2017   Procedure: INSERTION PORT-A-CATH;  Surgeon: Robert Bellow, MD;  Location: [facility] ORS;  Service: General;  Laterality: Right;   SIMPLE MASTECTOMY WITH AXILLARY SENTINEL NODE BIOPSY Left 12/23/2017   T2,N2 with 6/7 nodes positive. Whole breast radiation.  Surgeon: Robert Bellow, MD;  Location: [facility] ORS;  Service: General;  Laterality: Left;   TONSILLECTOMY      FAMILY HISTORY: Family History  Problem Relation Age  of Onset   Stroke Mother    Thyroid disease Mother    Renal Disease Mother    Stroke Father    Heart attack Father    Sudden death Father 90       suicide   Anuerysm Brother    Breast cancer Neg Hx     ADVANCED DIRECTIVES (Y/N):  N  HEALTH  MAINTENANCE: Social History   Tobacco Use   Smoking status: Former    Packs/day: 1.00    Years: 30.00    Total pack years: 30.00    Types: Cigarettes    Quit date: 2019    Years since quitting: 4.9   Smokeless tobacco: Never  Vaping Use   Vaping Use: Never used  Substance Use Topics   Alcohol use: Not Currently   Drug use: Never     Colonoscopy:  PAP:  Bone density:  Lipid panel:  Allergies  Allergen Reactions   Sulfa Antibiotics Diarrhea    Current Outpatient Medications  Medication Sig Dispense Refill   acetaminophen (TYLENOL) 500 MG tablet Take 1,000 mg by mouth every 6 (six) hours as needed for moderate pain or headache.      ALPRAZolam (XANAX) 0.5 MG tablet TAKE 1 TABLET BY MOUTH TWICE DAILY 60 tablet 0   amLODipine (NORVASC) 5 MG tablet Take 1 tablet (5 mg total) by mouth daily. 90 tablet 3   aspirin EC 81 MG tablet Take 81 mg by mouth at bedtime.      calcium carbonate (OS-CAL - DOSED IN MG OF ELEMENTAL CALCIUM) 1250 (500 Ca) MG tablet Take 1 tablet by mouth daily with breakfast.     chlorhexidine (PERIDEX) 0.12 % solution SMARTSIG:By Mouth     Cholecalciferol (VITAMIN D3) 125 MCG (5000 UT) CAPS Take 1 capsule by mouth 2 (two) times a day.     docusate sodium (COLACE) 100 MG capsule Take 100 mg by mouth 3 (three) times daily as needed for mild constipation.     Erythromycin 500 MG TBEC Take 1 tablet by mouth 3 (three) times daily.     hydrochlorothiazide (HYDRODIURIL) 12.5 MG tablet Take 1 tablet (12.5 mg total) by mouth daily. 90 tablet 3   levothyroxine (SYNTHROID) 100 MCG tablet TAKE 1 TABLET BY MOUTH DAILY 90 tablet 3   nystatin-lidocaine-prednisoLONE-diphenhydrAMINE-distilled water-alum & mag hydroxide-simeth Swish and spit 5-75m four times a day as needed 480 mL 3   olopatadine (PATANOL) 0.1 % ophthalmic solution Place 1 drop into the right eye 2 (two) times daily. 5 mL 12   palbociclib (IBRANCE) 125 MG tablet Take 1 tablet (125 mg total) by mouth daily. Take  for 21 days, then hold for 7 days. Repeat every 28 days. 21 tablet 3   potassium chloride SA (KLOR-CON M) 20 MEQ tablet TAKE 1 TABLET BY MOUTH TWICE DAILY 60 tablet 3   PREVIDENT 5000 PLUS 1.1 % CREA dental cream SMARTSIG:Application By Mouth     Probiotic Product (ALIGN PO) Take by mouth.     rosuvastatin (CRESTOR) 5 MG tablet Take 1 tablet (5 mg total) by mouth daily. 90 tablet 3   traMADol (ULTRAM) 50 MG tablet Take 1 tablet (50 mg total) by mouth every 6 (six) hours as needed. 60 tablet 0   traZODone (DESYREL) 50 MG tablet TAKE 1 TABLET BY MOUTH DAILY 30 tablet 6   triamcinolone cream (KENALOG) 0.1 % Apply 1 application. topically 2 (two) times daily. 30 g 0   No current facility-administered medications for this visit.  OBJECTIVE: Vitals:   12/12/21 1442  BP: 125/68  Pulse: 69  Resp: 16  Temp: 98.6 F (37 C)  SpO2: 97%     Body mass index is 35.51 kg/m.    ECOG FS:0 - Asymptomatic  General: Well-developed, well-nourished, no acute distress. Eyes: Pink conjunctiva, anicteric sclera. HEENT: Normocephalic, moist mucous membranes.  Left jaw swollen and tender. Lungs: No audible wheezing or coughing. Heart: Regular rate and rhythm. Abdomen: Soft, nontender, no obvious distention. Musculoskeletal: No edema, cyanosis, or clubbing. Neuro: Alert, answering all questions appropriately. Cranial nerves grossly intact. Skin: No rashes or petechiae noted. Psych: Normal affect.   LAB RESULTS:  Lab Results  Component Value Date   NA 133 (L) 12/12/2021   K 3.9 12/12/2021   CL 99 12/12/2021   CO2 25 12/12/2021   GLUCOSE 105 (H) 12/12/2021   BUN 24 (H) 12/12/2021   CREATININE 1.67 (H) 12/12/2021   CALCIUM 9.5 12/12/2021   PROT 7.6 12/12/2021   ALBUMIN 4.0 12/12/2021   AST 22 12/12/2021   ALT 15 12/12/2021   ALKPHOS 92 12/12/2021   BILITOT 0.7 12/12/2021   GFRNONAA 32 (L) 12/12/2021   GFRAA 57 (L) 10/01/2019    Lab Results  Component Value Date   WBC 2.1 (L) 12/12/2021    NEUTROABS 1.3 (L) 12/12/2021   HGB 9.9 (L) 12/12/2021   HCT 28.9 (L) 12/12/2021   MCV 105.1 (H) 12/12/2021   PLT 142 (L) 12/12/2021     STUDIES: No results found.   ONCOLOGY HISTORY: Patient initially received neoadjuvant chemotherapy with Adriamycin and Cytoxan followed by weekly Taxol. She only received 4 cycles of weekly Taxol prior to discontinuation of treatment on October 31, 2017 secondary to persistent peripheral neuropathy.  She ultimately required mastectomy and final pathology noted 5 of 6 lymph nodes positive for disease.  Patient completed adjuvant XRT in mid April 2020.  Pain initiated letrozole in May 2020, this was discontinued secondary to side effects and patient was started on anastrozole.  Nuclear medicine bone scan on June 19, 2019 revealed metastatic disease and anastrozole was subsequently discontinued.  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  PLAN:    1. 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  PET scan results from June 30, 2019 reviewed independently with metastatic bony disease, but no obvious evidence of visceral disease.  MRI of the brain on August 24, 2019 reviewed independently with no obvious evidence of metastatic disease.  Patient's CA 27-29 initially increased to 141.9, but appears to have plateaued since November 2021 between 46.9 and 96.5.  Her most recent result was 76.0.  Patient continues to have persistent neutropenia and thrombocytopenia.  Proceed with current dose of Ibrance and Faslodex today.  Continue to hold Xgeva at this time given her issues with her mandible.  Return to clinic in 4 weeks for further evaluation and continuation of treatment.  Appreciate clinical pharmacy input. 2.  Peripheral neuropathy: Chronic and unchanged. 3.  History of  pulmonary embolus: Patient was diagnosed with a small pulmonary embolus on January 10, 2018.  She is no longer on anticoagulation.  There was no evidence of recurrent PE on CT scan from October 27, 2019. 4.  Osteopenia: Patient's most recent bone mineral density on March 03, 2019 reported a T score of -1.8 which is only mildly decreased from 1 year prior when  the reported T score was -1.6.  Continue calcium and vitamin D supplementation.  Continue to hold Xgeva as above. 5.  Left hip/flank pain: Patient was evaluated by radiation oncology who determined that no further XRT is possible.  MRI results from November 05, 2021 with similar to PET scan as above with no obvious evidence of fracture.  Patient reports she takes pain medication sparingly.   6.  Leukopenia: Chronic and unchanged.  Patient's total white blood cell count was 2.1 with an ANC of 1.3.  Total white blood cell count has ranged between 1.6 and 3.5 since July 2021.  7.  Anemia: Chronic and unchanged.  Patient's hemoglobin is 9.9 today. 8.  Renal insufficiency: Creatinine slightly worse than baseline at 1.67.  Encourage fluid intake. 9.  Hypokalemia: Resolved.  Continue oral supplementation as prescribed.   10.  UTI: Resolved. 11.  Dental pathology: Continue follow up with Dr. Hampton Abbot.  Patient reports she had a Panorex recently, but does not know these results.  She is currently not on antibiotics. 12.  Thrombocytopenia: Mild.  Patient's platelet count is 142.  Continue treatment as above.  Patient expressed understanding and was in agreement with this plan. She also understands that She can call clinic at any time with any questions, concerns, or complaints.    Cancer Staging  Breast cancer, stage 2, left ([place]) Staging form: Breast, AJCC 8th Edition - Clinical stage from 07/30/2017: Stage IIA (cT2, cN1, cM0, G2, ER+, PR+, HER2-) - Signed by Lloyd Huger, MD on 07/30/2017 Histologic grading system: 3 grade system Laterality:  Left   Lloyd Huger, MD   12/12/2021 7:19 PM

## 2021-12-12 NOTE — Progress Notes (Signed)
Wants to know if you have been in communication with anyone about her jaw swelling on right side which has not gotten any better. Has concerns about it not healing.

## 2021-12-12 NOTE — Progress Notes (Signed)
[place]  Telephone:(336) [phone] Fax:(336) [phone]  Patient Care Team: [name], [name], Harrell Gave, [name] Rico Junker, RN as Registered Nurse Grayland Ormond Kathlene November, MD as Consulting Physician (Oncology) Bary Castilla Forest Gleason, MD as Consulting Physician (General Surgery) Bary Castilla Forest Gleason, MD as Consulting Physician (General Surgery) Noreene Filbert, MD as Referring Physician (Radiation Oncology) Jeral Fruit, RN as Registered Nurse   Name of the patient: Alicia [name]  [id number]  05-12-1950   Date of visit: 12/12/2021  HPI: Patient is a 71 y.o. female with recurrent stage IV ER/PR positive, HER2 negative breast cancer. Patient started on Ibrance on 07/09/19.   Reason for Consult: Oral chemotherapy follow-up for Ibrance (palbociclib) therapy.   PAST MEDICAL HISTORY: Past Medical History:  Diagnosis Date   Anxiety    Breast cancer, left ([place]) 07/2017   Depression    Hypertension    Hypothyroidism    Personal history of chemotherapy 2019   LEFT mastectomy-chemo before   Personal history of radiation therapy 03/2018   LEFT mastectomy   Rapid heart rate    Thyroid disease     PAST SURGICAL HISTORY:  Past Surgical History:  Procedure Laterality Date   AXILLARY LYMPH NODE BIOPSY Left 07/16/2017   METASTATIC MAMMARY CARCINOMA   BREAST BIOPSY Left 07/16/2017   [place] bx of left breast mass and left breast LN.  INVASIVE MAMMARY CARCINOMA, NO SPECIAL TYPE.    BREAST EXCISIONAL BIOPSY Right 2001   benign   BREAST LUMPECTOMY WITH SENTINEL LYMPH NODE BIOPSY Left 12/06/2017   Procedure: BREAST LUMPECTOMY WITH SENTINEL LYMPH NODE BX;  Surgeon: Robert Bellow, MD;  Location: [facility] ORS;  Service: General;  Laterality: Left;   COLONOSCOPY     MASTECTOMY Left 12/23/2017   PORTACATH PLACEMENT Right 08/07/2017   Procedure: INSERTION PORT-A-CATH;  Surgeon:  Robert Bellow, MD;  Location: [facility] ORS;  Service: General;  Laterality: Right;   SIMPLE MASTECTOMY WITH AXILLARY SENTINEL NODE BIOPSY Left 12/23/2017   T2,N2 with 6/7 nodes positive. Whole breast radiation.  Surgeon: Robert Bellow, MD;  Location: [facility] ORS;  Service: General;  Laterality: Left;   TONSILLECTOMY      HEMATOLOGY/ONCOLOGY HISTORY:  Oncology History Overview Note  Patient is a 71 year old female who recently self palpated a mass on her left breast.  Subsequent imaging and biopsy revealed the above-stated breast cancer.  Case was also discussed extensively at case conference.  Given the size and the stage of patient's malignancy, she will benefit from neoadjuvant chemotherapy using Adriamycin, Cytoxan, and Taxol.  Patient will also require Neulasta support.  Will get CT scan of the chest, abdomen, and pelvis to assess for any metastatic disease.  Patient will also require port placement and MUGA prior to initiating treatment  CT abdomen/pelvis/chest did not reveal any suspicious lesions concerning for metastatic disease. (08/01/17)  Port-A-Cath placed on 08/07/2017.  Cycle 1 day 1 of AC was given on 08/08/17.     Breast cancer, stage 2, left ([place])  07/24/2017 Initial Diagnosis   Breast cancer, stage 2, left ([place])   07/30/2017 Cancer Staging   Staging form: Breast, AJCC 8th Edition - Clinical stage from 07/30/2017: Stage IIA (cT2, cN1, cM0, G2, ER+, PR+, HER2-) - Signed by Lloyd Huger, MD on 07/30/2017   08/08/2017 - 10/31/2017 Chemotherapy   The patient had DOXOrubicin (ADRIAMYCIN) chemo injection 130 mg, 60 mg/m2 = 130 mg, Intravenous,  Once, 4  of 4 cycles Administration: 130 mg (08/08/2017), 130 mg (08/22/2017), 130 mg (09/05/2017), 130 mg (09/19/2017) palonosetron (ALOXI) injection 0.25 mg, 0.25 mg, Intravenous,  Once, 4 of 4 cycles Administration: 0.25 mg (08/08/2017), 0.25 mg (08/22/2017), 0.25 mg (09/05/2017), 0.25 mg (09/19/2017) pegfilgrastim-cbqv (UDENYCA) injection 6  mg, 6 mg, Subcutaneous, Once, 4 of 4 cycles Administration: 6 mg (08/09/2017), 6 mg (08/23/2017), 6 mg (09/06/2017), 6 mg (09/20/2017) cyclophosphamide (CYTOXAN) 1,300 mg in sodium chloride 0.9 % 250 mL chemo infusion, 600 mg/m2 = 1,300 mg, Intravenous,  Once, 4 of 4 cycles Administration: 1,300 mg (08/08/2017), 1,300 mg (08/22/2017), 1,300 mg (09/05/2017), 1,300 mg (09/19/2017) PACLitaxel (TAXOL) 174 mg in sodium chloride 0.9 % 250 mL chemo infusion (</= 87m/m2), 80 mg/m2 = 174 mg, Intravenous,  Once, 4 of 12 cycles Dose modification: 72 mg/m2 (original dose 80 mg/m2, Cycle 6, Reason: Dose not tolerated) Administration: 174 mg (10/03/2017), 156 mg (10/17/2017), 156 mg (10/24/2017), 156 mg (10/31/2017) fosaprepitant (EMEND) 150 mg, dexamethasone (DECADRON) 12 mg in sodium chloride 0.9 % 145 mL IVPB, , Intravenous,  Once, 4 of 4 cycles Administration:  (08/08/2017),  (08/22/2017),  (09/05/2017),  (09/19/2017)  for chemotherapy treatment.      ALLERGIES:  is allergic to sulfa antibiotics.  MEDICATIONS:  Current Outpatient Medications  Medication Sig Dispense Refill   acetaminophen (TYLENOL) 500 MG tablet Take 1,000 mg by mouth every 6 (six) hours as needed for moderate pain or headache.      ALPRAZolam (XANAX) 0.5 MG tablet TAKE 1 TABLET BY MOUTH TWICE DAILY 60 tablet 0   amLODipine (NORVASC) 5 MG tablet Take 1 tablet (5 mg total) by mouth daily. 90 tablet 3   aspirin EC 81 MG tablet Take 81 mg by mouth at bedtime.      calcium carbonate (OS-CAL - DOSED IN MG OF ELEMENTAL CALCIUM) 1250 (500 Ca) MG tablet Take 1 tablet by mouth daily with breakfast.     chlorhexidine (PERIDEX) 0.12 % solution SMARTSIG:By Mouth     Cholecalciferol (VITAMIN D3) 125 MCG (5000 UT) CAPS Take 1 capsule by mouth 2 (two) times a day.     docusate sodium (COLACE) 100 MG capsule Take 100 mg by mouth 3 (three) times daily as needed for mild constipation.     Erythromycin 500 MG TBEC Take 1 tablet by mouth 3 (three) times daily.      hydrochlorothiazide (HYDRODIURIL) 12.5 MG tablet Take 1 tablet (12.5 mg total) by mouth daily. 90 tablet 3   levothyroxine (SYNTHROID) 100 MCG tablet TAKE 1 TABLET BY MOUTH DAILY 90 tablet 3   nystatin-lidocaine-prednisoLONE-diphenhydrAMINE-distilled water-alum & mag hydroxide-simeth Swish and spit 5-147mfour times a day as needed 480 mL 3   olopatadine (PATANOL) 0.1 % ophthalmic solution Place 1 drop into the right eye 2 (two) times daily. 5 mL 12   palbociclib (IBRANCE) 125 MG tablet Take 1 tablet (125 mg total) by mouth daily. Take for 21 days, then hold for 7 days. Repeat every 28 days. 21 tablet 3   potassium chloride SA (KLOR-CON M) 20 MEQ tablet TAKE 1 TABLET BY MOUTH TWICE DAILY 60 tablet 3   PREVIDENT 5000 PLUS 1.1 % CREA dental cream SMARTSIG:Application By Mouth     Probiotic Product (ALIGN PO) Take by mouth.     rosuvastatin (CRESTOR) 5 MG tablet Take 1 tablet (5 mg total) by mouth daily. 90 tablet 3   traMADol (ULTRAM) 50 MG tablet Take 1 tablet (50 mg total) by mouth every 6 (six) hours as needed. 60 tablet  0   traZODone (DESYREL) 50 MG tablet TAKE 1 TABLET BY MOUTH DAILY 30 tablet 6   triamcinolone cream (KENALOG) 0.1 % Apply 1 application. topically 2 (two) times daily. 30 g 0   No current facility-administered medications for this visit.    VITAL SIGNS: There were no vitals taken for this visit. There were no vitals filed for this visit.    Estimated body mass index is 35.51 kg/m as calculated from the following:   Height as of an earlier encounter on 12/12/21: _0  (1.676 m).   Weight as of an earlier encounter on 12/12/21: 99.8 kg (220 lb).  LABS: CBC:    Component Value Date/Time   WBC 2.1 (L) 12/12/2021 1427   HGB 9.9 (L) 12/12/2021 1427   HGB 14.2 02/11/2011 1349   HCT 28.9 (L) 12/12/2021 1427   HCT 40.9 02/11/2011 1349   PLT 142 (L) 12/12/2021 1427   PLT 151 02/11/2011 1349   MCV 105.1 (H) 12/12/2021 1427   MCV 89 02/11/2011 1349   NEUTROABS 1.3 (L)  12/12/2021 1427   LYMPHSABS 0.3 (L) 12/12/2021 1427   MONOABS 0.4 12/12/2021 1427   EOSABS 0.0 12/12/2021 1427   BASOSABS 0.0 12/12/2021 1427   Comprehensive Metabolic Panel:    Component Value Date/Time   NA 133 (L) 12/12/2021 1427   NA 142 02/11/2011 1349   K 3.9 12/12/2021 1427   K 3.8 02/11/2011 1349   CL 99 12/12/2021 1427   CL 107 02/11/2011 1349   CO2 25 12/12/2021 1427   CO2 24 02/11/2011 1349   BUN 24 (H) 12/12/2021 1427   BUN 14 02/11/2011 1349   CREATININE 1.67 (H) 12/12/2021 1427   CREATININE 0.65 02/11/2011 1349   GLUCOSE 105 (H) 12/12/2021 1427   GLUCOSE 98 02/11/2011 1349   CALCIUM 9.5 12/12/2021 1427   CALCIUM 9.0 02/11/2011 1349   AST 22 12/12/2021 1427   AST 27 02/11/2011 1349   ALT 15 12/12/2021 1427   ALT 36 02/11/2011 1349   ALKPHOS 92 12/12/2021 1427   ALKPHOS 122 02/11/2011 1349   BILITOT 0.7 12/12/2021 1427   BILITOT 0.5 02/11/2011 1349   PROT 7.6 12/12/2021 1427   PROT 7.0 02/11/2011 1349   ALBUMIN 4.0 12/12/2021 1427   ALBUMIN 4.1 02/11/2011 1349    RADIOGRAPHIC STUDIES: No results found.   Assessment and Plan-  Reviewed CBC and CMP, continue palbociclib 169m 21on/7off Dental issue  Continue to hold Xgeva, not yet cleared by dentist to resume treatment.  Fatigue: patient reports actually feeling less fatigued since her last visit No other reported side effects  Oral Chemotherapy Adherence: No reported missed doses in this month  New medications: none, reported  Medication Access Issues: No issues, fills her [facility] Home[facility] WM[facility]issa Ben, patient advocate, is helping patient to enroll in [employer] assistance for the new year because she received a letter that she was on longer eligible to Medicare Extra Help (LIS).   Patient expressed understanding and was in agreement with this plan. She also understands that She can call clinic at any time with any questions, concerns, or complaints.   Follow-up plan: RTC in 4  weeks  Thank you for allowing me to participate in the care of this very pleasant patient.   Time Total: 15 mins  Visit consisted of counseling and education on dealing with issues of symptom management in the setting of serious and potentially life-threatening illness.Greater than 50%  of this time was spent counseling and  coordinating care related to the above assessment and plan.  Signed by: Darl Pikes, PharmD, BCPS, Salley Slaughter, CPP Hematology/Oncology Clinical Pharmacist Practitioner [facility] [phone]  12/12/2021 4:08 PM

## 2021-12-12 NOTE — Telephone Encounter (Signed)
Oral Oncology Patient Advocate Encounter   Began application for assistance for Ibrance through [employer].   Application will be submitted upon completion of necessary supporting documentation.   [employer]'s phone number [phone].   I will continue to check the status until final determination.   Alicia [name], [facility] Patient [place]  [phone] (phone) [phone] (fax) 12/12/2021 3:56 PM

## 2021-12-13 LAB — CANCER ANTIGEN 27.29: CA 27.29: 75.6 U/mL — ABNORMAL HIGH (ref 0.0–38.6)

## 2021-12-13 NOTE — Telephone Encounter (Signed)
Oral Oncology Patient Advocate Encounter   Submitted application for assistance for Ibrance to [employer].   Application submitted via e-fax to [phone]   [employer]'s phone number [phone].   I will continue to check the status until final determination.   Berdine Addison, [facility] Patient [place]  [phone] (phone) [phone] (fax) 12/13/2021 8:28 AM

## 2021-12-18 ENCOUNTER — Other Ambulatory Visit (HOSPITAL_COMMUNITY): Payer: Self-pay

## 2021-12-25 ENCOUNTER — Other Ambulatory Visit: Payer: Self-pay | Admitting: Nurse Practitioner

## 2021-12-25 DIAGNOSIS — F419 Anxiety disorder, unspecified: Secondary | ICD-10-CM

## 2021-12-28 ENCOUNTER — Other Ambulatory Visit (HOSPITAL_COMMUNITY): Payer: Self-pay

## 2021-12-29 ENCOUNTER — Other Ambulatory Visit: Payer: Self-pay

## 2021-12-29 ENCOUNTER — Other Ambulatory Visit (HOSPITAL_COMMUNITY): Payer: Self-pay

## 2022-01-02 ENCOUNTER — Other Ambulatory Visit (HOSPITAL_COMMUNITY): Payer: Self-pay

## 2022-01-07 ENCOUNTER — Other Ambulatory Visit: Payer: Self-pay | Admitting: Oncology

## 2022-01-11 ENCOUNTER — Inpatient Hospital Stay: Payer: Medicare Other | Attending: Oncology

## 2022-01-11 ENCOUNTER — Inpatient Hospital Stay: Payer: Medicare Other

## 2022-01-11 ENCOUNTER — Inpatient Hospital Stay: Payer: Medicare Other | Admitting: Pharmacist

## 2022-01-11 ENCOUNTER — Encounter: Payer: Self-pay | Admitting: Oncology

## 2022-01-11 ENCOUNTER — Inpatient Hospital Stay (HOSPITAL_BASED_OUTPATIENT_CLINIC_OR_DEPARTMENT_OTHER): Payer: Medicare Other | Admitting: Oncology

## 2022-01-11 VITALS — BP 144/61 | HR 89 | Temp 98.1°F | Resp 18 | Ht 66.0 in | Wt 217.0 lb

## 2022-01-11 DIAGNOSIS — Z79899 Other long term (current) drug therapy: Secondary | ICD-10-CM | POA: Insufficient documentation

## 2022-01-11 DIAGNOSIS — C7951 Secondary malignant neoplasm of bone: Secondary | ICD-10-CM | POA: Diagnosis not present

## 2022-01-11 DIAGNOSIS — Z5111 Encounter for antineoplastic chemotherapy: Secondary | ICD-10-CM | POA: Diagnosis not present

## 2022-01-11 DIAGNOSIS — C50912 Malignant neoplasm of unspecified site of left female breast: Secondary | ICD-10-CM

## 2022-01-11 LAB — CBC WITH DIFFERENTIAL/PLATELET
Abs Immature Granulocytes: 0.02 10*3/uL (ref 0.00–0.07)
Basophils Absolute: 0.1 10*3/uL (ref 0.0–0.1)
Basophils Relative: 2 %
Eosinophils Absolute: 0 10*3/uL (ref 0.0–0.5)
Eosinophils Relative: 1 %
HCT: 29.5 % — ABNORMAL LOW (ref 36.0–46.0)
Hemoglobin: 9.9 g/dL — ABNORMAL LOW (ref 12.0–15.0)
Immature Granulocytes: 1 %
Lymphocytes Relative: 14 %
Lymphs Abs: 0.5 10*3/uL — ABNORMAL LOW (ref 0.7–4.0)
MCH: 35.9 pg — ABNORMAL HIGH (ref 26.0–34.0)
MCHC: 33.6 g/dL (ref 30.0–36.0)
MCV: 106.9 fL — ABNORMAL HIGH (ref 80.0–100.0)
Monocytes Absolute: 0.5 10*3/uL (ref 0.1–1.0)
Monocytes Relative: 14 %
Neutro Abs: 2.3 10*3/uL (ref 1.7–7.7)
Neutrophils Relative %: 68 %
Platelets: 241 10*3/uL (ref 150–400)
RBC: 2.76 MIL/uL — ABNORMAL LOW (ref 3.87–5.11)
RDW: 14.2 % (ref 11.5–15.5)
WBC: 3.4 10*3/uL — ABNORMAL LOW (ref 4.0–10.5)
nRBC: 0 % (ref 0.0–0.2)

## 2022-01-11 LAB — COMPREHENSIVE METABOLIC PANEL
ALT: 17 U/L (ref 0–44)
AST: 30 U/L (ref 15–41)
Albumin: 3.9 g/dL (ref 3.5–5.0)
Alkaline Phosphatase: 105 U/L (ref 38–126)
Anion gap: 13 (ref 5–15)
BUN: 26 mg/dL — ABNORMAL HIGH (ref 8–23)
CO2: 25 mmol/L (ref 22–32)
Calcium: 9.7 mg/dL (ref 8.9–10.3)
Chloride: 99 mmol/L (ref 98–111)
Creatinine, Ser: 1.57 mg/dL — ABNORMAL HIGH (ref 0.44–1.00)
GFR, Estimated: 35 mL/min — ABNORMAL LOW (ref >60)
Glucose, Bld: 110 mg/dL — ABNORMAL HIGH (ref 70–99)
Potassium: 3.5 mmol/L (ref 3.5–5.1)
Sodium: 137 mmol/L (ref 135–145)
Total Bilirubin: 0.8 mg/dL (ref 0.3–1.2)
Total Protein: 7.7 g/dL (ref 6.5–8.1)

## 2022-01-11 MED ORDER — FULVESTRANT 250 MG/5ML IM SOSY
500.0000 mg | PREFILLED_SYRINGE | Freq: Once | INTRAMUSCULAR | Status: DC
  Filled 2022-01-11: qty 10

## 2022-01-11 MED ORDER — FULVESTRANT 250 MG/5ML IM SOSY
500.0000 mg | PREFILLED_SYRINGE | Freq: Once | INTRAMUSCULAR | Status: AC
  Administered 2022-01-11: 500 mg via INTRAMUSCULAR

## 2022-01-11 NOTE — Progress Notes (Signed)
[place]  [phone] [phone]  ID: Alicia [name] OB: [birth year]  MR#: [id number]  JJO#:[id number]  Patient Care Team: [name], [name], Harrell Gave, [name] Rico Junker, RN as Registered Nurse Lloyd Huger, MD as Consulting Physician (Oncology) Bary Castilla Forest Gleason, MD as Consulting Physician (General Surgery) Bary Castilla Forest Gleason, MD as Consulting Physician (General Surgery) Noreene Filbert, MD as Referring Physician (Radiation Oncology) Jeral Fruit, RN as Registered Nurse   CHIEF COMPLAINT: Recurrent stage IV ER/PR positive, 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  INTERVAL HISTORY: Patient returns to clinic today for further evaluation and continuation of Ibrance and Faslodex.  She continues to have jaw pain and swelling, but has not been evaluated by her dentist in several months.  She has musculoskeletal left shoulder pain.  Her left hip pain is chronic and unchanged. She does not complain of weakness or fatigue today.  She continues to have a mild peripheral neuropathy, but no other neurologic complaints.  She denies any chest pain, shortness of breath, cough, or hemoptysis.  She denies any nausea, vomiting, constipation, or diarrhea.  She has no urinary complaints.  Patient offers no further specific complaints today.  REVIEW OF SYSTEMS:   Review of Systems  Constitutional: Negative.  Negative for fever, malaise/fatigue and weight loss.  HENT:  Negative for congestion.   Respiratory: Negative.  Negative for cough and shortness of breath.   Cardiovascular: Negative.  Negative for chest pain and leg swelling.  Gastrointestinal: Negative.  Negative for abdominal pain, constipation, diarrhea and nausea.  Genitourinary: Negative.  Negative for dysuria, flank pain and urgency.  Musculoskeletal:  Positive for joint pain. Negative for back  pain.  Skin: Negative.  Negative for rash.  Neurological:  Positive for tingling and sensory change. Negative for dizziness, focal weakness, weakness and headaches.  Psychiatric/Behavioral:  The patient is not nervous/anxious.     As per HPI. Otherwise, a complete review of systems is negative.  PAST MEDICAL HISTORY: Past Medical History:  Diagnosis Date   Anxiety    Breast cancer, left ([place]) 07/2017   Depression    Hypertension    Hypothyroidism    Personal history of chemotherapy 2019   LEFT mastectomy-chemo before   Personal history of radiation therapy 03/2018   LEFT mastectomy   Rapid heart rate    Thyroid disease     PAST SURGICAL HISTORY: Past Surgical History:  Procedure Laterality Date   AXILLARY LYMPH NODE BIOPSY Left 07/16/2017   METASTATIC MAMMARY CARCINOMA   BREAST BIOPSY Left 07/16/2017   [place] bx of left breast mass and left breast LN.  INVASIVE MAMMARY CARCINOMA, NO SPECIAL TYPE.    BREAST EXCISIONAL BIOPSY Right 2001   benign   BREAST LUMPECTOMY WITH SENTINEL LYMPH NODE BIOPSY Left 12/06/2017   Procedure: BREAST LUMPECTOMY WITH SENTINEL LYMPH NODE BX;  Surgeon: Robert Bellow, MD;  Location: [facility] ORS;  Service: General;  Laterality: Left;   COLONOSCOPY     MASTECTOMY Left 12/23/2017   PORTACATH PLACEMENT Right 08/07/2017   Procedure: INSERTION PORT-A-CATH;  Surgeon: Robert Bellow, MD;  Location: [facility] ORS;  Service: General;  Laterality: Right;   SIMPLE MASTECTOMY WITH AXILLARY SENTINEL NODE BIOPSY Left 12/23/2017   T2,N2 with 6/7 nodes positive. Whole breast radiation.  Surgeon: Robert Bellow, MD;  Location: [facility] ORS;  Service: General;  Laterality: Left;   TONSILLECTOMY      FAMILY HISTORY: Family  History  Problem Relation Age of Onset   Stroke Mother    Thyroid disease Mother    Renal Disease Mother    Stroke Father    Heart attack Father    Sudden death Father 64       suicide   Anuerysm Brother    Breast cancer Neg Hx      ADVANCED DIRECTIVES (Y/N):  N  HEALTH MAINTENANCE: Social History   Tobacco Use   Smoking status: Former    Packs/day: 1.00    Years: 30.00    Total pack years: 30.00    Types: Cigarettes    Quit date: 2019    Years since quitting: 4.9   Smokeless tobacco: Never  Vaping Use   Vaping Use: Never used  Substance Use Topics   Alcohol use: Not Currently   Drug use: Never     Colonoscopy:  PAP:  Bone density:  Lipid panel:  Allergies  Allergen Reactions   Sulfa Antibiotics Diarrhea    Current Outpatient Medications  Medication Sig Dispense Refill   acetaminophen (TYLENOL) 500 MG tablet Take 1,000 mg by mouth every 6 (six) hours as needed for moderate pain or headache.      ALPRAZolam (XANAX) 0.5 MG tablet TAKE 1 TABLET BY MOUTH TWICE DAILY 60 tablet 0   amLODipine (NORVASC) 5 MG tablet Take 1 tablet (5 mg total) by mouth daily. 90 tablet 3   aspirin EC 81 MG tablet Take 81 mg by mouth at bedtime.      calcium carbonate (OS-CAL - DOSED IN MG OF ELEMENTAL CALCIUM) 1250 (500 Ca) MG tablet Take 1 tablet by mouth daily with breakfast.     chlorhexidine (PERIDEX) 0.12 % solution SMARTSIG:By Mouth     Cholecalciferol (VITAMIN D3) 125 MCG (5000 UT) CAPS Take 1 capsule by mouth 2 (two) times a day.     docusate sodium (COLACE) 100 MG capsule Take 100 mg by mouth 3 (three) times daily as needed for mild constipation.     Erythromycin 500 MG TBEC Take 1 tablet by mouth 3 (three) times daily.     hydrochlorothiazide (HYDRODIURIL) 12.5 MG tablet Take 1 tablet (12.5 mg total) by mouth daily. 90 tablet 3   levothyroxine (SYNTHROID) 100 MCG tablet TAKE 1 TABLET BY MOUTH DAILY 90 tablet 3   nystatin-lidocaine-prednisoLONE-diphenhydrAMINE-distilled water-alum & mag hydroxide-simeth Swish and spit 5-47m four times a day as needed 480 mL 3   olopatadine (PATANOL) 0.1 % ophthalmic solution Place 1 drop into the right eye 2 (two) times daily. 5 mL 12   palbociclib (IBRANCE) 125 MG tablet  Take 1 tablet (125 mg total) by mouth daily. Take for 21 days, then hold for 7 days. Repeat every 28 days. 21 tablet 3   potassium chloride SA (KLOR-CON M) 20 MEQ tablet TAKE 1 TABLET BY MOUTH TWICE DAILY 60 tablet 3   PREVIDENT 5000 PLUS 1.1 % CREA dental cream SMARTSIG:Application By Mouth     Probiotic Product (ALIGN PO) Take by mouth.     rosuvastatin (CRESTOR) 5 MG tablet Take 1 tablet (5 mg total) by mouth daily. 90 tablet 3   traMADol (ULTRAM) 50 MG tablet Take 1 tablet (50 mg total) by mouth every 6 (six) hours as needed. 60 tablet 0   traZODone (DESYREL) 50 MG tablet TAKE 1 TABLET BY MOUTH DAILY 30 tablet 6   triamcinolone cream (KENALOG) 0.1 % Apply 1 application. topically 2 (two) times daily. 30 g 0   No current facility-administered medications  for this visit.   Facility-Administered Medications Ordered in Other Visits  Medication Dose Route Frequency Provider Last Rate Last Admin   fulvestrant (FASLODEX) injection 500 mg  500 mg Intramuscular Once Lloyd Huger, MD        OBJECTIVE: Vitals:   01/11/22 1427  BP: (!) 144/61  Pulse: 89  Resp: 18  Temp: 98.1 F (36.7 C)  SpO2: 98%     Body mass index is 35.02 kg/m.    ECOG FS:0 - Asymptomatic  General: Well-developed, well-nourished, no acute distress. Eyes: Pink conjunctiva, anicteric sclera. HEENT: Normocephalic, moist mucous membranes.  Right jaw remains swollen and tender. Lungs: No audible wheezing or coughing. Heart: Regular rate and rhythm. Abdomen: Soft, nontender, no obvious distention. Musculoskeletal: No edema, cyanosis, or clubbing. Neuro: Alert, answering all questions appropriately. Cranial nerves grossly intact. Skin: No rashes or petechiae noted. Psych: Normal affect.   LAB RESULTS:  Lab Results  Component Value Date   NA 137 01/11/2022   K 3.5 01/11/2022   CL 99 01/11/2022   CO2 25 01/11/2022   GLUCOSE 110 (H) 01/11/2022   BUN 26 (H) 01/11/2022   CREATININE 1.57 (H) 01/11/2022    CALCIUM 9.7 01/11/2022   PROT 7.7 01/11/2022   ALBUMIN 3.9 01/11/2022   AST 30 01/11/2022   ALT 17 01/11/2022   ALKPHOS 105 01/11/2022   BILITOT 0.8 01/11/2022   GFRNONAA 35 (L) 01/11/2022   GFRAA 57 (L) 10/01/2019    Lab Results  Component Value Date   WBC 3.4 (L) 01/11/2022   NEUTROABS 2.3 01/11/2022   HGB 9.9 (L) 01/11/2022   HCT 29.5 (L) 01/11/2022   MCV 106.9 (H) 01/11/2022   PLT 241 01/11/2022     STUDIES: No results found.   ONCOLOGY HISTORY: Patient initially received neoadjuvant chemotherapy with Adriamycin and Cytoxan followed by weekly Taxol. She only received 4 cycles of weekly Taxol prior to discontinuation of treatment on October 31, 2017 secondary to persistent peripheral neuropathy.  She ultimately required mastectomy and final pathology noted 5 of 6 lymph nodes positive for disease.  Patient completed adjuvant XRT in mid April 2020.  Pain initiated letrozole in May 2020, this was discontinued secondary to side effects and patient was started on anastrozole.  Nuclear medicine bone scan on June 19, 2019 revealed metastatic disease and anastrozole was subsequently discontinued.  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  PLAN:    1. 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  PET scan results from June 30, 2019 reviewed independently with metastatic bony disease, but no obvious evidence of visceral disease.  MRI of the brain on August 24, 2019 reviewed independently with no obvious evidence of metastatic disease.  Patient's CA 27-29 initially increased to 141.9, but appears to have plateaued since November 2021 between 46.9 and 96.5.  Her most recent result was 75.6.  Patient's neutropenia and thrombocytopenia have resolved.  Proceed with current dose of Ibrance and  Faslodex today.  Continue to hold Xgeva at this time given her issues with her mandible.  Return to clinic in 4 weeks for further evaluation and continuation of treatment.  Appreciate clinical pharmacy input. 2.  Peripheral neuropathy: Chronic and unchanged. 3.  History of pulmonary embolus: Patient was diagnosed with a small pulmonary embolus on January 10, 2018.  She is no longer on anticoagulation.  There was  no evidence of recurrent PE on CT scan from October 27, 2019. 4.  Osteopenia: Patient's most recent bone mineral density on March 03, 2019 reported a T score of -1.8 which is only mildly decreased from 1 year prior when the reported T score was -1.6.  Continue calcium and vitamin D supplementation.  Continue to hold Xgeva as above. 5.  Left hip/flank pain: Patient was evaluated by radiation oncology who determined that no further XRT is possible.  MRI results from November 05, 2021 with similar to PET scan as above with no obvious evidence of fracture.  Patient reports she takes pain medication sparingly.   6.  Leukopenia: Essentially resolved.  Patient's total white blood cell count is 3.4 with an ANC of 2.3.  Total white blood cell count has ranged between 1.6 and 3.5 since July 2021.  7.  Anemia: Chronic and unchanged.  Patient's hemoglobin is 9.9 today. 8.  Renal insufficiency: Chronic and unchanged.  Patient's creatinine is 1.57 today. 9.  Hypokalemia: Resolved.  Continue oral supplementation as prescribed.   10.  UTI: Resolved. 11.  Dental pathology: Continue follow up with Dr. Hampton Abbot.  Patient reports she had a Panorex recently, but does not know these results.  She is currently not on antibiotics.  Patient has been instructed to call his office and arrange follow-up in the near future. 12.  Thrombocytopenia: Resolved.  Patient expressed understanding and was in agreement with this plan. She also understands that She can call clinic at any time with any questions, concerns, or  complaints.    Cancer Staging  Breast cancer, stage 2, left ([place]) Staging form: Breast, AJCC 8th Edition - Clinical stage from 07/30/2017: Stage IIA (cT2, cN1, cM0, G2, ER+, PR+, HER2-) - Signed by Lloyd Huger, MD on 07/30/2017 Histologic grading system: 3 grade system Laterality: Left   Lloyd Huger, MD   01/11/2022 3:59 PM

## 2022-01-11 NOTE — Progress Notes (Signed)
Left shoulder pain that she wonders could be related to cancer. Still having jaw pain in right side.

## 2022-01-12 LAB — CANCER ANTIGEN 27.29: CA 27.29: 97 U/mL — ABNORMAL HIGH (ref 0.0–38.6)

## 2022-01-16 NOTE — Progress Notes (Signed)
[place]  Telephone:(336) [phone] Fax:(336) [phone]  Patient Care Team: [name], [name], Harrell Gave, [name] Rico Junker, RN as Registered Nurse [place] Ormond Kathlene November, MD as Consulting Physician (Oncology) Bary Castilla Forest Gleason, MD as Consulting Physician (General Surgery) Bary Castilla Forest Gleason, MD as Consulting Physician (General Surgery) Noreene Filbert, MD as Referring Physician (Radiation Oncology) Jeral Fruit, RN as Registered Nurse   Name of the patient: Alicia [name]  [id number]  09-26-1950   Date of visit: 01/11/2022  HPI: Patient is a 72 y.o. female with recurrent stage IV ER/PR positive, HER2 negative breast cancer. Patient started on Ibrance on 07/09/19.   Reason for Consult: Oral chemotherapy follow-up for Ibrance (palbociclib) therapy.   PAST MEDICAL HISTORY: Past Medical History:  Diagnosis Date   Anxiety    Breast cancer, left ([place]) 07/2017   Depression    Hypertension    Hypothyroidism    Personal history of chemotherapy 2019   LEFT mastectomy-chemo before   Personal history of radiation therapy 03/2018   LEFT mastectomy   Rapid heart rate    Thyroid disease     PAST SURGICAL HISTORY:  Past Surgical History:  Procedure Laterality Date   AXILLARY LYMPH NODE BIOPSY Left 07/16/2017   METASTATIC MAMMARY CARCINOMA   BREAST BIOPSY Left 07/16/2017   [place] bx of left breast mass and left breast LN.  INVASIVE MAMMARY CARCINOMA, NO SPECIAL TYPE.    BREAST EXCISIONAL BIOPSY Right 2001   benign   BREAST LUMPECTOMY WITH SENTINEL LYMPH NODE BIOPSY Left 12/06/2017   Procedure: BREAST LUMPECTOMY WITH SENTINEL LYMPH NODE BX;  Surgeon: Robert Bellow, MD;  Location: [facility] ORS;  Service: General;  Laterality: Left;   COLONOSCOPY     MASTECTOMY Left 12/23/2017   PORTACATH PLACEMENT Right 08/07/2017   Procedure: INSERTION PORT-A-CATH;  Surgeon:  Robert Bellow, MD;  Location: [facility] ORS;  Service: General;  Laterality: Right;   SIMPLE MASTECTOMY WITH AXILLARY SENTINEL NODE BIOPSY Left 12/23/2017   T2,N2 with 6/7 nodes positive. Whole breast radiation.  Surgeon: Robert Bellow, MD;  Location: [facility] ORS;  Service: General;  Laterality: Left;   TONSILLECTOMY      HEMATOLOGY/ONCOLOGY HISTORY:  Oncology History Overview Note  Patient is a 72 year old female who recently self palpated a mass on her left breast.  Subsequent imaging and biopsy revealed the above-stated breast cancer.  Case was also discussed extensively at case conference.  Given the size and the stage of patient's malignancy, she will benefit from neoadjuvant chemotherapy using Adriamycin, Cytoxan, and Taxol.  Patient will also require Neulasta support.  Will get CT scan of the chest, abdomen, and pelvis to assess for any metastatic disease.  Patient will also require port placement and MUGA prior to initiating treatment  CT abdomen/pelvis/chest did not reveal any suspicious lesions concerning for metastatic disease. (08/01/17)  Port-A-Cath placed on 08/07/2017.  Cycle 1 day 1 of AC was given on 08/08/17.     Breast cancer, stage 2, left ([place])  07/24/2017 Initial Diagnosis   Breast cancer, stage 2, left ([place])   07/30/2017 Cancer Staging   Staging form: Breast, AJCC 8th Edition - Clinical stage from 07/30/2017: Stage IIA (cT2, cN1, cM0, G2, ER+, PR+, HER2-) - Signed by Lloyd Huger, MD on 07/30/2017   08/08/2017 - 10/31/2017 Chemotherapy   The patient had DOXOrubicin (ADRIAMYCIN) chemo injection 130 mg, 60 mg/m2 = 130 mg, Intravenous,  Once, 4  of 4 cycles Administration: 130 mg (08/08/2017), 130 mg (08/22/2017), 130 mg (09/05/2017), 130 mg (09/19/2017) palonosetron (ALOXI) injection 0.25 mg, 0.25 mg, Intravenous,  Once, 4 of 4 cycles Administration: 0.25 mg (08/08/2017), 0.25 mg (08/22/2017), 0.25 mg (09/05/2017), 0.25 mg (09/19/2017) pegfilgrastim-cbqv (UDENYCA) injection 6  mg, 6 mg, Subcutaneous, Once, 4 of 4 cycles Administration: 6 mg (08/09/2017), 6 mg (08/23/2017), 6 mg (09/06/2017), 6 mg (09/20/2017) cyclophosphamide (CYTOXAN) 1,300 mg in sodium chloride 0.9 % 250 mL chemo infusion, 600 mg/m2 = 1,300 mg, Intravenous,  Once, 4 of 4 cycles Administration: 1,300 mg (08/08/2017), 1,300 mg (08/22/2017), 1,300 mg (09/05/2017), 1,300 mg (09/19/2017) PACLitaxel (TAXOL) 174 mg in sodium chloride 0.9 % 250 mL chemo infusion (</= 87m/m2), 80 mg/m2 = 174 mg, Intravenous,  Once, 4 of 12 cycles Dose modification: 72 mg/m2 (original dose 80 mg/m2, Cycle 6, Reason: Dose not tolerated) Administration: 174 mg (10/03/2017), 156 mg (10/17/2017), 156 mg (10/24/2017), 156 mg (10/31/2017) fosaprepitant (EMEND) 150 mg, dexamethasone (DECADRON) 12 mg in sodium chloride 0.9 % 145 mL IVPB, , Intravenous,  Once, 4 of 4 cycles Administration:  (08/08/2017),  (08/22/2017),  (09/05/2017),  (09/19/2017)  for chemotherapy treatment.      ALLERGIES:  is allergic to sulfa antibiotics.  MEDICATIONS:  Current Outpatient Medications  Medication Sig Dispense Refill   acetaminophen (TYLENOL) 500 MG tablet Take 1,000 mg by mouth every 6 (six) hours as needed for moderate pain or headache.      ALPRAZolam (XANAX) 0.5 MG tablet TAKE 1 TABLET BY MOUTH TWICE DAILY 60 tablet 0   amLODipine (NORVASC) 5 MG tablet Take 1 tablet (5 mg total) by mouth daily. 90 tablet 3   aspirin EC 81 MG tablet Take 81 mg by mouth at bedtime.      calcium carbonate (OS-CAL - DOSED IN MG OF ELEMENTAL CALCIUM) 1250 (500 Ca) MG tablet Take 1 tablet by mouth daily with breakfast.     chlorhexidine (PERIDEX) 0.12 % solution SMARTSIG:By Mouth     Cholecalciferol (VITAMIN D3) 125 MCG (5000 UT) CAPS Take 1 capsule by mouth 2 (two) times a day.     docusate sodium (COLACE) 100 MG capsule Take 100 mg by mouth 3 (three) times daily as needed for mild constipation.     Erythromycin 500 MG TBEC Take 1 tablet by mouth 3 (three) times daily.      hydrochlorothiazide (HYDRODIURIL) 12.5 MG tablet Take 1 tablet (12.5 mg total) by mouth daily. 90 tablet 3   levothyroxine (SYNTHROID) 100 MCG tablet TAKE 1 TABLET BY MOUTH DAILY 90 tablet 3   nystatin-lidocaine-prednisoLONE-diphenhydrAMINE-distilled water-alum & mag hydroxide-simeth Swish and spit 5-147mfour times a day as needed 480 mL 3   olopatadine (PATANOL) 0.1 % ophthalmic solution Place 1 drop into the right eye 2 (two) times daily. 5 mL 12   palbociclib (IBRANCE) 125 MG tablet Take 1 tablet (125 mg total) by mouth daily. Take for 21 days, then hold for 7 days. Repeat every 28 days. 21 tablet 3   potassium chloride SA (KLOR-CON M) 20 MEQ tablet TAKE 1 TABLET BY MOUTH TWICE DAILY 60 tablet 3   PREVIDENT 5000 PLUS 1.1 % CREA dental cream SMARTSIG:Application By Mouth     Probiotic Product (ALIGN PO) Take by mouth.     rosuvastatin (CRESTOR) 5 MG tablet Take 1 tablet (5 mg total) by mouth daily. 90 tablet 3   traMADol (ULTRAM) 50 MG tablet Take 1 tablet (50 mg total) by mouth every 6 (six) hours as needed. 60 tablet  0   traZODone (DESYREL) 50 MG tablet TAKE 1 TABLET BY MOUTH DAILY 30 tablet 6   triamcinolone cream (KENALOG) 0.1 % Apply 1 application. topically 2 (two) times daily. 30 g 0   No current facility-administered medications for this visit.    VITAL SIGNS: There were no vitals taken for this visit. There were no vitals filed for this visit.    Estimated body mass index is 35.02 kg/m as calculated from the following:   Height as of an earlier encounter on 01/11/22: _0  (1.676 m).   Weight as of an earlier encounter on 01/11/22: 98.4 kg (217 lb).  LABS: CBC:    Component Value Date/Time   WBC 3.4 (L) 01/11/2022 1418   HGB 9.9 (L) 01/11/2022 1418   HGB 14.2 02/11/2011 1349   HCT 29.5 (L) 01/11/2022 1418   HCT 40.9 02/11/2011 1349   PLT 241 01/11/2022 1418   PLT 151 02/11/2011 1349   MCV 106.9 (H) 01/11/2022 1418   MCV 89 02/11/2011 1349   NEUTROABS 2.3 01/11/2022  1418   LYMPHSABS 0.5 (L) 01/11/2022 1418   MONOABS 0.5 01/11/2022 1418   EOSABS 0.0 01/11/2022 1418   BASOSABS 0.1 01/11/2022 1418   Comprehensive Metabolic Panel:    Component Value Date/Time   NA 137 01/11/2022 1418   NA 142 02/11/2011 1349   K 3.5 01/11/2022 1418   K 3.8 02/11/2011 1349   CL 99 01/11/2022 1418   CL 107 02/11/2011 1349   CO2 25 01/11/2022 1418   CO2 24 02/11/2011 1349   BUN 26 (H) 01/11/2022 1418   BUN 14 02/11/2011 1349   CREATININE 1.57 (H) 01/11/2022 1418   CREATININE 0.65 02/11/2011 1349   GLUCOSE 110 (H) 01/11/2022 1418   GLUCOSE 98 02/11/2011 1349   CALCIUM 9.7 01/11/2022 1418   CALCIUM 9.0 02/11/2011 1349   AST 30 01/11/2022 1418   AST 27 02/11/2011 1349   ALT 17 01/11/2022 1418   ALT 36 02/11/2011 1349   ALKPHOS 105 01/11/2022 1418   ALKPHOS 122 02/11/2011 1349   BILITOT 0.8 01/11/2022 1418   BILITOT 0.5 02/11/2011 1349   PROT 7.7 01/11/2022 1418   PROT 7.0 02/11/2011 1349   ALBUMIN 3.9 01/11/2022 1418   ALBUMIN 4.1 02/11/2011 1349    RADIOGRAPHIC STUDIES: No results found.   Assessment and Plan-  Reviewed CBC and CMP, continue palbociclib 142m 21on/7off Dental issue  Continue to hold Xgeva, not yet cleared by dentist to resume treatment.  She thinks she may need to have another tooth pulled No reported side effects  Oral Chemotherapy Adherence: No reported missed doses in this month  New medications: none, reported  Medication Access Issues: No issues, enroll in [employer] [place] assistance for the new year pending  Patient expressed understanding and was in agreement with this plan. She also understands that She can call clinic at any time with any questions, concerns, or complaints.   Follow-up plan: RTC in 4 weeks  Thank you for allowing me to participate in the care of this very pleasant patient.   Time Total: 15 mins  Visit consisted of counseling and education on dealing with issues of symptom management in the setting of  serious and potentially life-threatening illness.Greater than 50%  of this time was spent counseling and coordinating care related to the above assessment and plan.  Signed by: ADarl Pikes PharmD, BCPS, BCOP, CPP Hematology/Oncology Clinical Pharmacist Practitioner [phone]

## 2022-01-19 ENCOUNTER — Other Ambulatory Visit: Payer: Self-pay | Admitting: Internal Medicine

## 2022-01-20 ENCOUNTER — Encounter: Payer: Self-pay | Admitting: Oncology

## 2022-01-24 ENCOUNTER — Other Ambulatory Visit (HOSPITAL_COMMUNITY): Payer: Self-pay

## 2022-01-25 ENCOUNTER — Telehealth: Payer: Self-pay

## 2022-01-25 NOTE — Telephone Encounter (Signed)
Received notification that grant funding opened for patient's disease state. Successfully enrolled to [employer]. Will put manufacturer assistance on hold until needed.   Berdine Addison, [facility] Patient [place]  [phone] (phone) [phone] (fax) 01/25/2022 2:16 PM

## 2022-01-25 NOTE — Telephone Encounter (Signed)
Oral Oncology Patient Advocate Encounter  Was successful in securing patient a $15,000 grant from [employer] to provide copayment coverage for [place].  This will keep the out of pocket expense at $0.     [employer] ID: [id number]  I have spoken with the patient.   The billing information is as follows and has been shared with [place].    RxBin: [id number] PCN: [id number] Member ID: [id number] Group ID: [id number] Dates of Eligibility: 01/17/22 through 01/17/23  Fund:  [place], [facility] Patient [place]  [phone] (phone) [phone] (fax) 01/25/2022 2:15 PM

## 2022-01-26 ENCOUNTER — Other Ambulatory Visit (HOSPITAL_COMMUNITY): Payer: Self-pay

## 2022-01-29 ENCOUNTER — Encounter: Payer: Self-pay | Admitting: Oncology

## 2022-01-29 ENCOUNTER — Other Ambulatory Visit (HOSPITAL_COMMUNITY): Payer: Self-pay

## 2022-01-31 ENCOUNTER — Other Ambulatory Visit (HOSPITAL_COMMUNITY): Payer: Self-pay

## 2022-02-02 ENCOUNTER — Other Ambulatory Visit (HOSPITAL_COMMUNITY): Payer: Self-pay

## 2022-02-08 ENCOUNTER — Inpatient Hospital Stay: Payer: Medicare Other

## 2022-02-08 ENCOUNTER — Ambulatory Visit: Payer: Medicare Other | Admitting: Oncology

## 2022-02-08 ENCOUNTER — Inpatient Hospital Stay: Payer: Medicare Other | Attending: Oncology

## 2022-02-08 ENCOUNTER — Ambulatory Visit: Payer: Medicare Other | Admitting: Pharmacist

## 2022-02-08 ENCOUNTER — Other Ambulatory Visit: Payer: Medicare Other

## 2022-02-08 ENCOUNTER — Inpatient Hospital Stay (HOSPITAL_BASED_OUTPATIENT_CLINIC_OR_DEPARTMENT_OTHER): Payer: Medicare Other | Admitting: Oncology

## 2022-02-08 ENCOUNTER — Encounter: Payer: Self-pay | Admitting: Oncology

## 2022-02-08 ENCOUNTER — Ambulatory Visit: Payer: Medicare Other

## 2022-02-08 ENCOUNTER — Inpatient Hospital Stay: Payer: Medicare Other | Admitting: Pharmacist

## 2022-02-08 VITALS — BP 117/60 | HR 68 | Temp 96.7°F | Resp 18 | Ht 66.0 in | Wt 214.0 lb

## 2022-02-08 DIAGNOSIS — C7951 Secondary malignant neoplasm of bone: Secondary | ICD-10-CM | POA: Insufficient documentation

## 2022-02-08 DIAGNOSIS — C50912 Malignant neoplasm of unspecified site of left female breast: Secondary | ICD-10-CM | POA: Diagnosis not present

## 2022-02-08 DIAGNOSIS — Z5111 Encounter for antineoplastic chemotherapy: Secondary | ICD-10-CM | POA: Diagnosis not present

## 2022-02-08 DIAGNOSIS — Z79899 Other long term (current) drug therapy: Secondary | ICD-10-CM | POA: Insufficient documentation

## 2022-02-08 LAB — CBC WITH DIFFERENTIAL/PLATELET
Abs Immature Granulocytes: 0.02 10*3/uL (ref 0.00–0.07)
Basophils Absolute: 0.1 10*3/uL (ref 0.0–0.1)
Basophils Relative: 2 %
Eosinophils Absolute: 0 10*3/uL (ref 0.0–0.5)
Eosinophils Relative: 1 %
HCT: 30 % — ABNORMAL LOW (ref 36.0–46.0)
Hemoglobin: 9.8 g/dL — ABNORMAL LOW (ref 12.0–15.0)
Immature Granulocytes: 1 %
Lymphocytes Relative: 13 %
Lymphs Abs: 0.4 10*3/uL — ABNORMAL LOW (ref 0.7–4.0)
MCH: 36.7 pg — ABNORMAL HIGH (ref 26.0–34.0)
MCHC: 32.7 g/dL (ref 30.0–36.0)
MCV: 112.4 fL — ABNORMAL HIGH (ref 80.0–100.0)
Monocytes Absolute: 0.3 10*3/uL (ref 0.1–1.0)
Monocytes Relative: 11 %
Neutro Abs: 2.2 10*3/uL (ref 1.7–7.7)
Neutrophils Relative %: 72 %
Platelets: 261 10*3/uL (ref 150–400)
RBC: 2.67 MIL/uL — ABNORMAL LOW (ref 3.87–5.11)
RDW: 14.3 % (ref 11.5–15.5)
WBC: 3 10*3/uL — ABNORMAL LOW (ref 4.0–10.5)
nRBC: 0 % (ref 0.0–0.2)

## 2022-02-08 LAB — COMPREHENSIVE METABOLIC PANEL
ALT: 11 U/L (ref 0–44)
AST: 23 U/L (ref 15–41)
Albumin: 3.9 g/dL (ref 3.5–5.0)
Alkaline Phosphatase: 94 U/L (ref 38–126)
Anion gap: 12 (ref 5–15)
BUN: 26 mg/dL — ABNORMAL HIGH (ref 8–23)
CO2: 26 mmol/L (ref 22–32)
Calcium: 9.9 mg/dL (ref 8.9–10.3)
Chloride: 99 mmol/L (ref 98–111)
Creatinine, Ser: 1.85 mg/dL — ABNORMAL HIGH (ref 0.44–1.00)
GFR, Estimated: 29 mL/min — ABNORMAL LOW (ref >60)
Glucose, Bld: 96 mg/dL (ref 70–99)
Potassium: 3.7 mmol/L (ref 3.5–5.1)
Sodium: 137 mmol/L (ref 135–145)
Total Bilirubin: 0.7 mg/dL (ref 0.3–1.2)
Total Protein: 7.6 g/dL (ref 6.5–8.1)

## 2022-02-08 MED ORDER — FULVESTRANT 250 MG/5ML IM SOSY
500.0000 mg | PREFILLED_SYRINGE | Freq: Once | INTRAMUSCULAR | Status: AC
  Administered 2022-02-08: 500 mg via INTRAMUSCULAR
  Filled 2022-02-08: qty 10

## 2022-02-08 MED ORDER — DOXYCYCLINE HYCLATE 100 MG PO TABS: 100.0000 mg | ORAL_TABLET | Freq: Two times a day (BID) | ORAL | 0 refills | Status: DC

## 2022-02-08 NOTE — Progress Notes (Signed)
[name]  Telephone:(336) [phone] Fax:([phone]  ID: Alicia [name] OB: 01-Mar-1950  MR#: [id number]  FGH#:[id number]  Patient Care Team: [name], [name], Harrell Gave, [name] Rico Junker, RN as Registered Nurse Lloyd Huger, MD as Consulting Physician (Oncology) Bary Castilla Forest Gleason, MD as Consulting Physician (General Surgery) Bary Castilla Forest Gleason, MD as Consulting Physician (General Surgery) Noreene Filbert, MD as Referring Physician (Radiation Oncology) Jeral Fruit, RN as Registered Nurse   CHIEF COMPLAINT: Recurrent stage IV ER/PR positive, 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  INTERVAL HISTORY: Patient returns to clinic today for further evaluation and continuation of Ibrance and Faslodex.  Her jaw pain and swelling is worse she is unable to see her dentist until the end of February.  She continues to have chronic left hip pain. She does not complain of weakness or fatigue today.  She continues to have a mild peripheral neuropathy, but no other neurologic complaints.  She denies any chest pain, shortness of breath, cough, or hemoptysis.  She denies any nausea, vomiting, constipation, or diarrhea.  She has no urinary complaints.  Patient offers no further specific complaints today.  REVIEW OF SYSTEMS:   Review of Systems  Constitutional: Negative.  Negative for fever, malaise/fatigue and weight loss.  HENT:  Negative for congestion.   Respiratory: Negative.  Negative for cough and shortness of breath.   Cardiovascular: Negative.  Negative for chest pain and leg swelling.  Gastrointestinal: Negative.  Negative for abdominal pain, constipation, diarrhea and nausea.  Genitourinary: Negative.  Negative for dysuria, flank pain and urgency.  Musculoskeletal:  Positive for joint pain. Negative for back pain.  Skin: Negative.  Negative for rash.   Neurological:  Positive for tingling and sensory change. Negative for dizziness, focal weakness, weakness and headaches.  Psychiatric/Behavioral:  The patient is not nervous/anxious.     As per HPI. Otherwise, a complete review of systems is negative.  PAST MEDICAL HISTORY: Past Medical History:  Diagnosis Date   Anxiety    Breast cancer, left ([place]) 07/2017   Depression    Hypertension    Hypothyroidism    Personal history of chemotherapy 2019   LEFT mastectomy-chemo before   Personal history of radiation therapy 03/2018   LEFT mastectomy   Rapid heart rate    Thyroid disease     PAST SURGICAL HISTORY: Past Surgical History:  Procedure Laterality Date   AXILLARY LYMPH NODE BIOPSY Left 07/16/2017   METASTATIC MAMMARY CARCINOMA   BREAST BIOPSY Left 07/16/2017   [place] bx of left breast mass and left breast LN.  INVASIVE MAMMARY CARCINOMA, NO SPECIAL TYPE.    BREAST EXCISIONAL BIOPSY Right 2001   benign   BREAST LUMPECTOMY WITH SENTINEL LYMPH NODE BIOPSY Left 12/06/2017   Procedure: BREAST LUMPECTOMY WITH SENTINEL LYMPH NODE BX;  Surgeon: Robert Bellow, MD;  Location: [facility] ORS;  Service: General;  Laterality: Left;   COLONOSCOPY     MASTECTOMY Left 12/23/2017   PORTACATH PLACEMENT Right 08/07/2017   Procedure: INSERTION PORT-A-CATH;  Surgeon: Robert Bellow, MD;  Location: [facility] ORS;  Service: General;  Laterality: Right;   SIMPLE MASTECTOMY WITH AXILLARY SENTINEL NODE BIOPSY Left 12/23/2017   T2,N2 with 6/7 nodes positive. Whole breast radiation.  Surgeon: Robert Bellow, MD;  Location: [facility] ORS;  Service: General;  Laterality: Left;   TONSILLECTOMY      FAMILY HISTORY: Family History  Problem Relation Age of Onset  Stroke Mother    Thyroid disease Mother    Renal Disease Mother    Stroke Father    Heart attack Father    Sudden death Father 37       suicide   Anuerysm Brother    Breast cancer Neg Hx     ADVANCED DIRECTIVES (Y/N):  N  HEALTH  MAINTENANCE: Social History   Tobacco Use   Smoking status: Former    Packs/day: 1.00    Years: 30.00    Total pack years: 30.00    Types: Cigarettes    Quit date: 2019    Years since quitting: 5.0   Smokeless tobacco: Never  Vaping Use   Vaping Use: Never used  Substance Use Topics   Alcohol use: Not Currently   Drug use: Never     Colonoscopy:  PAP:  Bone density:  Lipid panel:  Allergies  Allergen Reactions   Sulfa Antibiotics Diarrhea    Current Outpatient Medications  Medication Sig Dispense Refill   acetaminophen (TYLENOL) 500 MG tablet Take 1,000 mg by mouth every 6 (six) hours as needed for moderate pain or headache.      ALPRAZolam (XANAX) 0.5 MG tablet TAKE 1 TABLET BY MOUTH TWICE DAILY 60 tablet 0   amLODipine (NORVASC) 5 MG tablet Take 1 tablet (5 mg total) by mouth daily. 90 tablet 3   aspirin EC 81 MG tablet Take 81 mg by mouth at bedtime.      calcium carbonate (OS-CAL - DOSED IN MG OF ELEMENTAL CALCIUM) 1250 (500 Ca) MG tablet Take 1 tablet by mouth daily with breakfast.     chlorhexidine (PERIDEX) 0.12 % solution SMARTSIG:By Mouth     Cholecalciferol (VITAMIN D3) 125 MCG (5000 UT) CAPS Take 1 capsule by mouth 2 (two) times a day.     docusate sodium (COLACE) 100 MG capsule Take 100 mg by mouth 3 (three) times daily as needed for mild constipation.     doxycycline (VIBRA-TABS) 100 MG tablet Take 1 tablet (100 mg total) by mouth 2 (two) times daily for 14 days. 14 tablet 0   hydrochlorothiazide (HYDRODIURIL) 12.5 MG tablet TAKE 1 TABLET(12.5 MG) BY MOUTH DAILY 90 tablet 3   levothyroxine (SYNTHROID) 100 MCG tablet TAKE 1 TABLET BY MOUTH DAILY 90 tablet 3   olopatadine (PATANOL) 0.1 % ophthalmic solution Place 1 drop into the right eye 2 (two) times daily. 5 mL 12   palbociclib (IBRANCE) 125 MG tablet Take 1 tablet (125 mg total) by mouth daily. Take for 21 days, then hold for 7 days. Repeat every 28 days. 21 tablet 3   potassium chloride SA (KLOR-CON M) 20  MEQ tablet TAKE 1 TABLET BY MOUTH TWICE DAILY 60 tablet 3   PREVIDENT 5000 PLUS 1.1 % CREA dental cream SMARTSIG:Application By Mouth     Probiotic Product (ALIGN PO) Take by mouth.     rosuvastatin (CRESTOR) 5 MG tablet Take 1 tablet (5 mg total) by mouth daily. 90 tablet 3   traMADol (ULTRAM) 50 MG tablet Take 1 tablet (50 mg total) by mouth every 6 (six) hours as needed. 60 tablet 0   traZODone (DESYREL) 50 MG tablet TAKE 1 TABLET BY MOUTH DAILY 30 tablet 6   triamcinolone cream (KENALOG) 0.1 % Apply 1 application. topically 2 (two) times daily. 30 g 0   Erythromycin 500 MG TBEC Take 1 tablet by mouth 3 (three) times daily. (Patient not taking: Reported on 02/08/2022)     nystatin-lidocaine-prednisoLONE-diphenhydrAMINE-distilled water-alum & mag  hydroxide-simeth Swish and spit 5-56m four times a day as needed (Patient not taking: Reported on 02/08/2022) 480 mL 3   No current facility-administered medications for this visit.   Facility-Administered Medications Ordered in Other Visits  Medication Dose Route Frequency Provider Last Rate Last Admin   fulvestrant (FASLODEX) injection 500 mg  500 mg Intramuscular Once FLloyd Huger MD        OBJECTIVE: Vitals:   02/08/22 1512  BP: 117/60  Pulse: 68  Resp: 18  Temp: (!) 96.7 F (35.9 C)  SpO2: 98%     Body mass index is 34.54 kg/m.    ECOG FS:0 - Asymptomatic  General: Well-developed, well-nourished, no acute distress. Eyes: Pink conjunctiva, anicteric sclera. HEENT: Normocephalic, moist mucous membranes.  Right jaw swelling and tenderness worse today. Lungs: No audible wheezing or coughing. Heart: Regular rate and rhythm. Abdomen: Soft, nontender, no obvious distention. Musculoskeletal: No edema, cyanosis, or clubbing. Neuro: Alert, answering all questions appropriately. Cranial nerves grossly intact. Skin: No rashes or petechiae noted. Psych: Normal affect.    LAB RESULTS:  Lab Results  Component Value Date   NA 137  02/08/2022   K 3.7 02/08/2022   CL 99 02/08/2022   CO2 26 02/08/2022   GLUCOSE 96 02/08/2022   BUN 26 (H) 02/08/2022   CREATININE 1.85 (H) 02/08/2022   CALCIUM 9.9 02/08/2022   PROT 7.6 02/08/2022   ALBUMIN 3.9 02/08/2022   AST 23 02/08/2022   ALT 11 02/08/2022   ALKPHOS 94 02/08/2022   BILITOT 0.7 02/08/2022   GFRNONAA 29 (L) 02/08/2022   GFRAA 57 (L) 10/01/2019    Lab Results  Component Value Date   WBC 3.0 (L) 02/08/2022   NEUTROABS 2.2 02/08/2022   HGB 9.8 (L) 02/08/2022   HCT 30.0 (L) 02/08/2022   MCV 112.4 (H) 02/08/2022   PLT 261 02/08/2022     STUDIES: No results found.   ONCOLOGY HISTORY: Patient initially received neoadjuvant chemotherapy with Adriamycin and Cytoxan followed by weekly Taxol. She only received 4 cycles of weekly Taxol prior to discontinuation of treatment on October 31, 2017 secondary to persistent peripheral neuropathy.  She ultimately required mastectomy and final pathology noted 5 of 6 lymph nodes positive for disease.  Patient completed adjuvant XRT in mid April 2020.  Pain initiated letrozole in May 2020, this was discontinued secondary to side effects and patient was started on anastrozole.  Nuclear medicine bone scan on June 19, 2019 revealed metastatic disease and anastrozole was subsequently discontinued.  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  PLAN:    1. 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  PET scan results from June 30, 2019 reviewed independently with metastatic bony disease, but no obvious evidence of visceral disease.  MRI of the brain on August 24, 2019 reviewed independently with no obvious evidence of metastatic disease.  Patient's CA 27-29 initially increased to 141.9, but appears to have plateaued since November 2021  between 46.9 and 97.0 her most recent result was 97.0.  Today's result is pending.  Patient's neutropenia and thrombocytopenia have resolved.  Proceed with current dose of Ibrance and Faslodex today.  Continue to hold Xgeva at this time given her issues with her mandible.  Return to clinic in 4 weeks for further evaluation and continuation of treatment.   2.  Peripheral neuropathy: Chronic and unchanged.  3.  History of pulmonary embolus: Patient was diagnosed with a small pulmonary embolus on January 10, 2018.  She is no longer on anticoagulation.  There was no evidence of recurrent PE on CT scan from October 27, 2019. 4.  Osteopenia: Patient's most recent bone mineral density on March 03, 2019 reported a T score of -1.8 which is only mildly decreased from 1 year prior when the reported T score was -1.6.  Continue calcium and vitamin D supplementation.  Continue to hold Xgeva as above. 5.  Left hip/flank pain: Patient was evaluated by radiation oncology who determined that no further XRT is possible.  MRI results from November 05, 2021 with similar to PET scan as above with no obvious evidence of fracture.  Patient reports she takes pain medication sparingly.  Can consider referral to orthopedics if necessary. 6.  Leukopenia: Chronic and.  Patient's total white blood cell count is 3.0 with an ANC of 2.2.  Total white blood cell count has ranged between 1.6 and 3.5 since July 2021.  7.  Anemia: Chronic and unchanged.  Patient's hemoglobin is 9.8 today. 8.  Renal insufficiency: Creatinine slightly worse today at 1.85.  Monitor.  Likely secondary to poor p.o. intake given issues with her jaw. 9.  Hypokalemia: Resolved.  Continue oral supplementation as prescribed.   10.  UTI: Resolved. 11.  Dental pathology: Continue follow up with Dr. Hampton Abbot.  Patient reports she had a Panorex recently, but does not know these results.  Patient does not have follow-up till the end of February.  She was given a prescription  for doxycycline twice a day for 14 days today.   12.  Thrombocytopenia: Resolved.  Patient expressed understanding and was in agreement with this plan. She also understands that She can call clinic at any time with any questions, concerns, or complaints.    Cancer Staging  Breast cancer, stage 2, left ([place]) Staging form: Breast, AJCC 8th Edition - Clinical stage from 07/30/2017: Stage IIA (cT2, cN1, cM0, G2, ER+, PR+, HER2-) - Signed by Lloyd Huger, MD on 07/30/2017 Histologic grading system: 3 grade system Laterality: Left   Lloyd Huger, MD   02/08/2022 4:11 PM

## 2022-02-09 LAB — CANCER ANTIGEN 27.29: CA 27.29: 121.2 U/mL — ABNORMAL HIGH (ref 0.0–38.6)

## 2022-02-09 NOTE — Progress Notes (Signed)
Patient not seen by CPP today

## 2022-02-22 ENCOUNTER — Other Ambulatory Visit (HOSPITAL_COMMUNITY): Payer: Self-pay

## 2022-03-08 ENCOUNTER — Inpatient Hospital Stay: Payer: Medicare Other | Attending: Oncology

## 2022-03-08 ENCOUNTER — Inpatient Hospital Stay: Payer: Medicare Other

## 2022-03-08 ENCOUNTER — Inpatient Hospital Stay (HOSPITAL_BASED_OUTPATIENT_CLINIC_OR_DEPARTMENT_OTHER): Payer: Medicare Other | Admitting: Oncology

## 2022-03-08 ENCOUNTER — Other Ambulatory Visit: Payer: Self-pay

## 2022-03-08 ENCOUNTER — Encounter: Payer: Self-pay | Admitting: Oncology

## 2022-03-08 ENCOUNTER — Other Ambulatory Visit (HOSPITAL_COMMUNITY): Payer: Self-pay

## 2022-03-08 ENCOUNTER — Inpatient Hospital Stay: Payer: Medicare Other | Admitting: Pharmacist

## 2022-03-08 VITALS — BP 131/60 | HR 85 | Temp 98.2°F | Resp 18 | Ht 66.0 in | Wt 211.0 lb

## 2022-03-08 DIAGNOSIS — C7951 Secondary malignant neoplasm of bone: Secondary | ICD-10-CM

## 2022-03-08 DIAGNOSIS — Z5111 Encounter for antineoplastic chemotherapy: Secondary | ICD-10-CM | POA: Insufficient documentation

## 2022-03-08 DIAGNOSIS — C50912 Malignant neoplasm of unspecified site of left female breast: Secondary | ICD-10-CM | POA: Diagnosis not present

## 2022-03-08 DIAGNOSIS — C773 Secondary and unspecified malignant neoplasm of axilla and upper limb lymph nodes: Secondary | ICD-10-CM | POA: Diagnosis not present

## 2022-03-08 DIAGNOSIS — Z170 Estrogen receptor positive status [ER+]: Secondary | ICD-10-CM | POA: Insufficient documentation

## 2022-03-08 LAB — CBC WITH DIFFERENTIAL/PLATELET
Abs Immature Granulocytes: 0.03 10*3/uL (ref 0.00–0.07)
Basophils Absolute: 0.1 10*3/uL (ref 0.0–0.1)
Basophils Relative: 2 %
Eosinophils Absolute: 0 10*3/uL (ref 0.0–0.5)
Eosinophils Relative: 0 %
HCT: 30.1 % — ABNORMAL LOW (ref 36.0–46.0)
Hemoglobin: 10.1 g/dL — ABNORMAL LOW (ref 12.0–15.0)
Immature Granulocytes: 1 %
Lymphocytes Relative: 14 %
Lymphs Abs: 0.4 10*3/uL — ABNORMAL LOW (ref 0.7–4.0)
MCH: 35.9 pg — ABNORMAL HIGH (ref 26.0–34.0)
MCHC: 33.6 g/dL (ref 30.0–36.0)
MCV: 107.1 fL — ABNORMAL HIGH (ref 80.0–100.0)
Monocytes Absolute: 0.5 10*3/uL (ref 0.1–1.0)
Monocytes Relative: 15 %
Neutro Abs: 2.1 10*3/uL (ref 1.7–7.7)
Neutrophils Relative %: 68 %
Platelets: 196 10*3/uL (ref 150–400)
RBC: 2.81 MIL/uL — ABNORMAL LOW (ref 3.87–5.11)
RDW: 14.7 % (ref 11.5–15.5)
Smear Review: NORMAL
WBC: 3.1 10*3/uL — ABNORMAL LOW (ref 4.0–10.5)
nRBC: 0 % (ref 0.0–0.2)

## 2022-03-08 LAB — COMPREHENSIVE METABOLIC PANEL
ALT: 12 U/L (ref 0–44)
AST: 28 U/L (ref 15–41)
Albumin: 4 g/dL (ref 3.5–5.0)
Alkaline Phosphatase: 94 U/L (ref 38–126)
Anion gap: 13 (ref 5–15)
BUN: 27 mg/dL — ABNORMAL HIGH (ref 8–23)
CO2: 23 mmol/L (ref 22–32)
Calcium: 10.2 mg/dL (ref 8.9–10.3)
Chloride: 99 mmol/L (ref 98–111)
Creatinine, Ser: 1.66 mg/dL — ABNORMAL HIGH (ref 0.44–1.00)
GFR, Estimated: 33 mL/min — ABNORMAL LOW (ref >60)
Glucose, Bld: 105 mg/dL — ABNORMAL HIGH (ref 70–99)
Potassium: 3.6 mmol/L (ref 3.5–5.1)
Sodium: 135 mmol/L (ref 135–145)
Total Bilirubin: 0.8 mg/dL (ref 0.3–1.2)
Total Protein: 7.9 g/dL (ref 6.5–8.1)

## 2022-03-08 MED ORDER — FULVESTRANT 250 MG/5ML IM SOSY
500.0000 mg | PREFILLED_SYRINGE | Freq: Once | INTRAMUSCULAR | Status: AC
  Administered 2022-03-08: 500 mg via INTRAMUSCULAR
  Filled 2022-03-08: qty 10

## 2022-03-08 MED ORDER — DOXYCYCLINE HYCLATE 100 MG PO TABS: 100.0000 mg | ORAL_TABLET | Freq: Two times a day (BID) | ORAL | 0 refills | Status: AC

## 2022-03-08 NOTE — Progress Notes (Signed)
[place]  Telephone:(336) [phone] Fax:(336) [phone]  Patient Care Team: [name], [name], Harrell Gave, [name] Rico Junker, RN as Registered Nurse Grayland Ormond Kathlene November, MD as Consulting Physician (Oncology) Bary Castilla Forest Gleason, MD as Consulting Physician (General Surgery) Bary Castilla Forest Gleason, MD as Consulting Physician (General Surgery) Noreene Filbert, MD as Referring Physician (Radiation Oncology) Jeral Fruit, RN as Registered Nurse   Name of the patient: Alicia [name]  XT:[id number]  August 16, 1950   Date of visit: 03/08/22  HPI: Patient is a 72 y.o. female with metastatic ER+ HER2- breast cancer. Patient started Ibrance on 06/19/2019.  Reason for Consult: Oral chemotherapy follow-up for Ibrance (palbociclib) therapy.   PAST MEDICAL HISTORY: Past Medical History:  Diagnosis Date   Anxiety    Breast cancer, left ([place]) 07/2017   Depression    Hypertension    Hypothyroidism    Personal history of chemotherapy 2019   LEFT mastectomy-chemo before   Personal history of radiation therapy 03/2018   LEFT mastectomy   Rapid heart rate    Thyroid disease     HEMATOLOGY/ONCOLOGY HISTORY:  Oncology History Overview Note  Patient is a 72 year old female who recently self palpated a mass on her left breast.  Subsequent imaging and biopsy revealed the above-stated breast cancer.  Case was also discussed extensively at case conference.  Given the size and the stage of patient's malignancy, she will benefit from neoadjuvant chemotherapy using Adriamycin, Cytoxan, and Taxol.  Patient will also require Neulasta support.  Will get CT scan of the chest, abdomen, and pelvis to assess for any metastatic disease.  Patient will also require port placement and MUGA prior to initiating treatment  CT abdomen/pelvis/chest did not reveal any suspicious lesions concerning for  metastatic disease. (08/01/17)  Port-A-Cath placed on 08/07/2017.  Cycle 1 day 1 of AC was given on 08/08/17.     Breast cancer, stage 2, left ([place])  07/24/2017 Initial Diagnosis   Breast cancer, stage 2, left ([place])   07/30/2017 Cancer Staging   Staging form: Breast, AJCC 8th Edition - Clinical stage from 07/30/2017: Stage IIA (cT2, cN1, cM0, G2, ER+, PR+, HER2-) - Signed by Lloyd Huger, MD on 07/30/2017   08/08/2017 - 10/31/2017 Chemotherapy   The patient had DOXOrubicin (ADRIAMYCIN) chemo injection 130 mg, 60 mg/m2 = 130 mg, Intravenous,  Once, 4 of 4 cycles Administration: 130 mg (08/08/2017), 130 mg (08/22/2017), 130 mg (09/05/2017), 130 mg (09/19/2017) palonosetron (ALOXI) injection 0.25 mg, 0.25 mg, Intravenous,  Once, 4 of 4 cycles Administration: 0.25 mg (08/08/2017), 0.25 mg (08/22/2017), 0.25 mg (09/05/2017), 0.25 mg (09/19/2017) pegfilgrastim-cbqv (UDENYCA) injection 6 mg, 6 mg, Subcutaneous, Once, 4 of 4 cycles Administration: 6 mg (08/09/2017), 6 mg (08/23/2017), 6 mg (09/06/2017), 6 mg (09/20/2017) cyclophosphamide (CYTOXAN) 1,300 mg in sodium chloride 0.9 % 250 mL chemo infusion, 600 mg/m2 = 1,300 mg, Intravenous,  Once, 4 of 4 cycles Administration: 1,300 mg (08/08/2017), 1,300 mg (08/22/2017), 1,300 mg (09/05/2017), 1,300 mg (09/19/2017) PACLitaxel (TAXOL) 174 mg in sodium chloride 0.9 % 250 mL chemo infusion (</= '80mg'$ /m2), 80 mg/m2 = 174 mg, Intravenous,  Once, 4 of 12 cycles Dose modification: 72 mg/m2 (original dose 80 mg/m2, Cycle 6, Reason: Dose not tolerated) Administration: 174 mg (10/03/2017), 156 mg (10/17/2017), 156 mg (10/24/2017), 156 mg (10/31/2017) fosaprepitant (EMEND) 150 mg, dexamethasone (DECADRON) 12 mg in sodium chloride 0.9 % 145 mL IVPB, , Intravenous,  Once, 4 of 4 cycles  Administration:  (08/08/2017),  (08/22/2017),  (09/05/2017),  (09/19/2017)  for chemotherapy treatment.      ALLERGIES:  is allergic to sulfa antibiotics.  MEDICATIONS:  Current Outpatient Medications   Medication Sig Dispense Refill   acetaminophen (TYLENOL) 500 MG tablet Take 1,000 mg by mouth every 6 (six) hours as needed for moderate pain or headache.      ALPRAZolam (XANAX) 0.5 MG tablet TAKE 1 TABLET BY MOUTH TWICE DAILY 60 tablet 0   amLODipine (NORVASC) 5 MG tablet Take 1 tablet (5 mg total) by mouth daily. 90 tablet 3   aspirin EC 81 MG tablet Take 81 mg by mouth at bedtime.      calcium carbonate (OS-CAL - DOSED IN MG OF ELEMENTAL CALCIUM) 1250 (500 Ca) MG tablet Take 1 tablet by mouth daily with breakfast.     chlorhexidine (PERIDEX) 0.12 % solution SMARTSIG:By Mouth     Cholecalciferol (VITAMIN D3) 125 MCG (5000 UT) CAPS Take 1 capsule by mouth 2 (two) times a day.     docusate sodium (COLACE) 100 MG capsule Take 100 mg by mouth 3 (three) times daily as needed for mild constipation.     doxycycline (VIBRA-TABS) 100 MG tablet Take 1 tablet (100 mg total) by mouth 2 (two) times daily for 14 days. 14 tablet 0   Erythromycin 500 MG TBEC Take 1 tablet by mouth 3 (three) times daily. (Patient not taking: Reported on 02/08/2022)     hydrochlorothiazide (HYDRODIURIL) 12.5 MG tablet TAKE 1 TABLET(12.5 MG) BY MOUTH DAILY 90 tablet 3   levothyroxine (SYNTHROID) 100 MCG tablet TAKE 1 TABLET BY MOUTH DAILY 90 tablet 3   nystatin-lidocaine-prednisoLONE-diphenhydrAMINE-distilled water-alum & mag hydroxide-simeth Swish and spit 5-31m four times a day as needed (Patient not taking: Reported on 02/08/2022) 480 mL 3   olopatadine (PATANOL) 0.1 % ophthalmic solution Place 1 drop into the right eye 2 (two) times daily. 5 mL 12   palbociclib (IBRANCE) 125 MG tablet Take 1 tablet (125 mg total) by mouth daily. Take for 21 days, then hold for 7 days. Repeat every 28 days. 21 tablet 3   potassium chloride SA (KLOR-CON M) 20 MEQ tablet TAKE 1 TABLET BY MOUTH TWICE DAILY 60 tablet 3   PREVIDENT 5000 PLUS 1.1 % CREA dental cream SMARTSIG:Application By Mouth     Probiotic Product (ALIGN PO) Take by mouth.      rosuvastatin (CRESTOR) 5 MG tablet Take 1 tablet (5 mg total) by mouth daily. 90 tablet 3   traMADol (ULTRAM) 50 MG tablet Take 1 tablet (50 mg total) by mouth every 6 (six) hours as needed. 60 tablet 0   traZODone (DESYREL) 50 MG tablet TAKE 1 TABLET BY MOUTH DAILY 30 tablet 6   triamcinolone cream (KENALOG) 0.1 % Apply 1 application. topically 2 (two) times daily. 30 g 0   No current facility-administered medications for this visit.   Facility-Administered Medications Ordered in Other Visits  Medication Dose Route Frequency Provider Last Rate Last Admin   fulvestrant (FASLODEX) injection 500 mg  500 mg Intramuscular Once FLloyd Huger MD        VITAL SIGNS: There were no vitals taken for this visit. There were no vitals filed for this visit.  Estimated body mass index is 34.06 kg/m as calculated from the following:   Height as of an earlier encounter on 03/08/22: '5\' 6"'$  (1.676 m).   Weight as of an earlier encounter on 03/08/22: 95.7 kg (211 lb).  LABS: CBC:  Component Value Date/Time   WBC 3.1 (L) 03/08/2022 1501   HGB 10.1 (L) 03/08/2022 1501   HGB 14.2 02/11/2011 1349   HCT 30.1 (L) 03/08/2022 1501   HCT 40.9 02/11/2011 1349   PLT 196 03/08/2022 1501   PLT 151 02/11/2011 1349   MCV 107.1 (H) 03/08/2022 1501   MCV 89 02/11/2011 1349   NEUTROABS 2.1 03/08/2022 1501   LYMPHSABS 0.4 (L) 03/08/2022 1501   MONOABS 0.5 03/08/2022 1501   EOSABS 0.0 03/08/2022 1501   BASOSABS 0.1 03/08/2022 1501   Comprehensive Metabolic Panel:    Component Value Date/Time   NA 135 03/08/2022 1501   NA 142 02/11/2011 1349   K 3.6 03/08/2022 1501   K 3.8 02/11/2011 1349   CL 99 03/08/2022 1501   CL 107 02/11/2011 1349   CO2 23 03/08/2022 1501   CO2 24 02/11/2011 1349   BUN 27 (H) 03/08/2022 1501   BUN 14 02/11/2011 1349   CREATININE 1.66 (H) 03/08/2022 1501   CREATININE 0.65 02/11/2011 1349   GLUCOSE 105 (H) 03/08/2022 1501   GLUCOSE 98 02/11/2011 1349   CALCIUM 10.2  03/08/2022 1501   CALCIUM 9.0 02/11/2011 1349   AST 28 03/08/2022 1501   AST 27 02/11/2011 1349   ALT 12 03/08/2022 1501   ALT 36 02/11/2011 1349   ALKPHOS 94 03/08/2022 1501   ALKPHOS 122 02/11/2011 1349   BILITOT 0.8 03/08/2022 1501   BILITOT 0.5 02/11/2011 1349   PROT 7.9 03/08/2022 1501   PROT 7.0 02/11/2011 1349   ALBUMIN 4.0 03/08/2022 1501   ALBUMIN 4.1 02/11/2011 1349     Assessment and Plan: Reviewed CBC and CMP, continue palbociclib '125mg'$  21on/7off Dental issue: She has an appointment with dentist on 03/15/2022 to have tooth extracted Continue to hold Xgeva, not yet cleared by dentist to resume No reported side effects  Oral Chemotherapy Adherence: No reported missed doses in this month  New medications: none reported  Medication Access Issues: No issues  Patient expressed understanding and was in agreement with this plan. She also understands that She can call clinic at any time with any questions, concerns, or complaints.   Follow-up plan: RTC in 4 weeks  Thank you for allowing me to participate in the care of this very pleasant patient.   Time Total: 15 minutes  Visit consisted of counseling and education on dealing with issues of symptom management in the setting of serious and potentially life-threatening illness.Greater than 50%  of this time was spent counseling and coordinating care related to the above assessment and plan.  Signed by: Darl Pikes, PharmD, BCPS, Salley Slaughter, CPP Hematology/Oncology Clinical Pharmacist Practitioner [place]/DB/AP [facility] [phone]  03/08/2022 3:56 PM

## 2022-03-08 NOTE — Progress Notes (Signed)
Would like to see if she can get another round of doxycycline until she can get her tooth pulled on 2/29. Wants to talk about last CA 27.29.

## 2022-03-08 NOTE — Progress Notes (Signed)
[place]  [phone] [phone]  ID: Alicia [name] OB: 1950-06-12  MR#: [id number]  [id number]  Patient Care Team: [name], [name], Harrell Gave, [name] Rico Junker, RN as Registered Nurse Lloyd Huger, MD as Consulting Physician (Oncology) Bary Castilla Forest Gleason, MD as Consulting Physician (General Surgery) Bary Castilla Forest Gleason, MD as Consulting Physician (General Surgery) Noreene Filbert, MD as Referring Physician (Radiation Oncology) Jeral Fruit, RN as Registered Nurse   CHIEF COMPLAINT: Recurrent stage IV ER/PR positive, 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  INTERVAL HISTORY: Patient returns to clinic today for further evaluation and continuation of Ibrance and Faslodex.  Her jaw pain is slightly improved.  She has an appointment with dentistry next week.  She continues to have left shoulder and left hip pain.  She does not complain of weakness or fatigue today.  She continues to have a mild peripheral neuropathy, but no other neurologic complaints.  She denies any chest pain, shortness of breath, cough, or hemoptysis.  She denies any nausea, vomiting, constipation, or diarrhea.  She has no urinary complaints.  Patient offers no further specific complaints today.  REVIEW OF SYSTEMS:   Review of Systems  Constitutional: Negative.  Negative for fever, malaise/fatigue and weight loss.  HENT:  Negative for congestion.   Respiratory: Negative.  Negative for cough and shortness of breath.   Cardiovascular: Negative.  Negative for chest pain and leg swelling.  Gastrointestinal: Negative.  Negative for abdominal pain, constipation, diarrhea and nausea.  Genitourinary: Negative.  Negative for dysuria, flank pain and urgency.  Musculoskeletal:  Positive for joint pain. Negative for back pain.  Skin: Negative.  Negative for rash.   Neurological:  Positive for tingling and sensory change. Negative for dizziness, focal weakness, weakness and headaches.  Psychiatric/Behavioral:  The patient is not nervous/anxious.     As per HPI. Otherwise, a complete review of systems is negative.  PAST MEDICAL HISTORY: Past Medical History:  Diagnosis Date   Anxiety    Breast cancer, left ([place]) 07/2017   Depression    Hypertension    Hypothyroidism    Personal history of chemotherapy 2019   LEFT mastectomy-chemo before   Personal history of radiation therapy 03/2018   LEFT mastectomy   Rapid heart rate    Thyroid disease     PAST SURGICAL HISTORY: Past Surgical History:  Procedure Laterality Date   AXILLARY LYMPH NODE BIOPSY Left 07/16/2017   METASTATIC MAMMARY CARCINOMA   BREAST BIOPSY Left 07/16/2017   [place] bx of left breast mass and left breast LN.  INVASIVE MAMMARY CARCINOMA, NO SPECIAL TYPE.    BREAST EXCISIONAL BIOPSY Right 2001   benign   BREAST LUMPECTOMY WITH SENTINEL LYMPH NODE BIOPSY Left 12/06/2017   Procedure: BREAST LUMPECTOMY WITH SENTINEL LYMPH NODE BX;  Surgeon: Robert Bellow, MD;  Location: [facility] ORS;  Service: General;  Laterality: Left;   COLONOSCOPY     MASTECTOMY Left 12/23/2017   PORTACATH PLACEMENT Right 08/07/2017   Procedure: INSERTION PORT-A-CATH;  Surgeon: Robert Bellow, MD;  Location: [facility] ORS;  Service: General;  Laterality: Right;   SIMPLE MASTECTOMY WITH AXILLARY SENTINEL NODE BIOPSY Left 12/23/2017   T2,N2 with 6/7 nodes positive. Whole breast radiation.  Surgeon: Robert Bellow, MD;  Location: [facility] ORS;  Service: General;  Laterality: Left;   TONSILLECTOMY      FAMILY HISTORY: Family History  Problem Relation Age of Onset  Stroke Mother    Thyroid disease Mother    Renal Disease Mother    Stroke Father    Heart attack Father    Sudden death Father 27       suicide   Anuerysm Brother    Breast cancer Neg Hx     ADVANCED DIRECTIVES (Y/N):  N  HEALTH  MAINTENANCE: Social History   Tobacco Use   Smoking status: Former    Packs/day: 1.00    Years: 30.00    Total pack years: 30.00    Types: Cigarettes    Quit date: 2019    Years since quitting: 5.1   Smokeless tobacco: Never  Vaping Use   Vaping Use: Never used  Substance Use Topics   Alcohol use: Not Currently   Drug use: Never     Colonoscopy:  PAP:  Bone density:  Lipid panel:  Allergies  Allergen Reactions   Sulfa Antibiotics Diarrhea    Current Outpatient Medications  Medication Sig Dispense Refill   acetaminophen (TYLENOL) 500 MG tablet Take 1,000 mg by mouth every 6 (six) hours as needed for moderate pain or headache.      ALPRAZolam (XANAX) 0.5 MG tablet TAKE 1 TABLET BY MOUTH TWICE DAILY 60 tablet 0   amLODipine (NORVASC) 5 MG tablet Take 1 tablet (5 mg total) by mouth daily. 90 tablet 3   aspirin EC 81 MG tablet Take 81 mg by mouth at bedtime.      calcium carbonate (OS-CAL - DOSED IN MG OF ELEMENTAL CALCIUM) 1250 (500 Ca) MG tablet Take 1 tablet by mouth daily with breakfast.     chlorhexidine (PERIDEX) 0.12 % solution SMARTSIG:By Mouth     Cholecalciferol (VITAMIN D3) 125 MCG (5000 UT) CAPS Take 1 capsule by mouth 2 (two) times a day.     docusate sodium (COLACE) 100 MG capsule Take 100 mg by mouth 3 (three) times daily as needed for mild constipation.     hydrochlorothiazide (HYDRODIURIL) 12.5 MG tablet TAKE 1 TABLET(12.5 MG) BY MOUTH DAILY 90 tablet 3   levothyroxine (SYNTHROID) 100 MCG tablet TAKE 1 TABLET BY MOUTH DAILY 90 tablet 3   olopatadine (PATANOL) 0.1 % ophthalmic solution Place 1 drop into the right eye 2 (two) times daily. 5 mL 12   palbociclib (IBRANCE) 125 MG tablet Take 1 tablet (125 mg total) by mouth daily. Take for 21 days, then hold for 7 days. Repeat every 28 days. 21 tablet 3   potassium chloride SA (KLOR-CON M) 20 MEQ tablet TAKE 1 TABLET BY MOUTH TWICE DAILY 60 tablet 3   PREVIDENT 5000 PLUS 1.1 % CREA dental cream SMARTSIG:Application  By Mouth     Probiotic Product (ALIGN PO) Take by mouth.     rosuvastatin (CRESTOR) 5 MG tablet Take 1 tablet (5 mg total) by mouth daily. 90 tablet 3   traMADol (ULTRAM) 50 MG tablet Take 1 tablet (50 mg total) by mouth every 6 (six) hours as needed. 60 tablet 0   traZODone (DESYREL) 50 MG tablet TAKE 1 TABLET BY MOUTH DAILY 30 tablet 6   triamcinolone cream (KENALOG) 0.1 % Apply 1 application. topically 2 (two) times daily. 30 g 0   doxycycline (VIBRA-TABS) 100 MG tablet Take 1 tablet (100 mg total) by mouth 2 (two) times daily for 14 days. 14 tablet 0   Erythromycin 500 MG TBEC Take 1 tablet by mouth 3 (three) times daily. (Patient not taking: Reported on 02/08/2022)     nystatin-lidocaine-prednisoLONE-diphenhydrAMINE-distilled water-alum & mag  hydroxide-simeth Swish and spit 5-91m four times a day as needed (Patient not taking: Reported on 02/08/2022) 480 mL 3   No current facility-administered medications for this visit.    OBJECTIVE: Vitals:   03/08/22 1511  BP: 131/60  Pulse: 85  Resp: 18  Temp: 98.2 F (36.8 C)  SpO2: 100%     Body mass index is 34.06 kg/m.    ECOG FS:1 - Symptomatic but completely ambulatory  General: Well-developed, well-nourished, no acute distress. Eyes: Pink conjunctiva, anicteric sclera. HEENT: Normocephalic, moist mucous membranes.  Right jaw swelling slightly improved. Lungs: No audible wheezing or coughing. Heart: Regular rate and rhythm. Abdomen: Soft, nontender, no obvious distention. Musculoskeletal: No edema, cyanosis, or clubbing. Neuro: Alert, answering all questions appropriately. Cranial nerves grossly intact. Skin: No rashes or petechiae noted. Psych: Normal affect.     LAB RESULTS:  Lab Results  Component Value Date   NA 135 03/08/2022   K 3.6 03/08/2022   CL 99 03/08/2022   CO2 23 03/08/2022   GLUCOSE 105 (H) 03/08/2022   BUN 27 (H) 03/08/2022   CREATININE 1.66 (H) 03/08/2022   CALCIUM 10.2 03/08/2022   PROT 7.9 03/08/2022    ALBUMIN 4.0 03/08/2022   AST 28 03/08/2022   ALT 12 03/08/2022   ALKPHOS 94 03/08/2022   BILITOT 0.8 03/08/2022   GFRNONAA 33 (L) 03/08/2022   GFRAA 57 (L) 10/01/2019    Lab Results  Component Value Date   WBC 3.1 (L) 03/08/2022   NEUTROABS 2.1 03/08/2022   HGB 10.1 (L) 03/08/2022   HCT 30.1 (L) 03/08/2022   MCV 107.1 (H) 03/08/2022   PLT 196 03/08/2022     STUDIES: No results found.   ONCOLOGY HISTORY: Patient initially received neoadjuvant chemotherapy with Adriamycin and Cytoxan followed by weekly Taxol. She only received 4 cycles of weekly Taxol prior to discontinuation of treatment on October 31, 2017 secondary to persistent peripheral neuropathy.  She ultimately required mastectomy and final pathology noted 5 of 6 lymph nodes positive for disease.  Patient completed adjuvant XRT in mid April 2020.  Pain initiated letrozole in May 2020, this was discontinued secondary to side effects and patient was started on anastrozole.  Nuclear medicine bone scan on June 19, 2019 revealed metastatic disease and anastrozole was subsequently discontinued.  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  PET scan results from June 30, 2019 reviewed independently with metastatic bony disease, but no obvious evidence of visceral disease.  MRI of the brain on August 24, 2019 reviewed independently with no obvious evidence of metastatic disease.  Patient's CA 27-29 initially increased to 141.9, but appears to have plateaued since November 2021 between 46.9 and 97.0.  Her most recent result has trended up to 121.2.  Patient's neutropenia and thrombocytopenia have resolved.  Proceed with current dose of Ibrance and Faslodex today.  Continue to hold Xgeva  at this time given her issues with her mandible.  Return to clinic in 4 weeks for further evaluation and continuation of treatment.  Will get PET scan for further evaluation prior to next clinic appointment.  Appreciate clinical pharmacy input. Peripheral neuropathy: Chronic and unchanged. History of pulmonary embolus: Patient was diagnosed with a small pulmonary embolus on January 10, 2018.  She is no longer on anticoagulation.  There was no evidence of recurrent PE on CT scan from October 27, 2019. Osteopenia: Patient's most recent bone mineral density on March 03, 2019 reported a T score of -1.8 which is only mildly decreased from 1 year prior when the reported T score was -1.6.  Continue calcium and vitamin D supplementation.  Continue to hold Xgeva as above. Left hip/flank pain: Patient was evaluated by radiation oncology who determined that no further XRT is possible.  MRI results from November 05, 2021 with similar to PET scan as above with no obvious evidence of fracture.  Patient reports she takes pain medication sparingly.  Can consider referral to orthopedics if necessary.  Repeat PET scan as above. Leukopenia: Chronic and unchanged.  Patient total white blood cell count is 3.1 with an ANC of 2.1.  Total white blood cell count has ranged between 1.6 and 3.5 since July 2021.  Anemia: Chronic and unchanged.  Patient's hemoglobin is 10.1 today. Renal insufficiency: Creatinine is approximately at her baseline of 1.66.   Hypokalemia: Resolved.  Continue oral supplementation as prescribed.   Dental pathology: Continue follow up with Dr. Hampton Abbot.  Patient reports she has an appointment next week.  She was given a prescription for doxycycline twice a day for 14 days today.     Patient expressed understanding and was in agreement with this plan. She also understands that She can call clinic at any time with any questions, concerns, or complaints.    Cancer Staging  Breast cancer, stage 2, left  ([place]) Staging form: Breast, AJCC 8th Edition - Clinical stage from 07/30/2017: Stage IIA (cT2, cN1, cM0, G2, ER+, PR+, HER2-) - Signed by Lloyd Huger, MD on 07/30/2017 Histologic grading system: 3 grade system Laterality: Left   Lloyd Huger, MD   03/08/2022 5:29 PM

## 2022-03-09 ENCOUNTER — Encounter: Payer: Self-pay | Admitting: Licensed Clinical Social Worker

## 2022-03-09 ENCOUNTER — Encounter: Payer: Self-pay | Admitting: *Deleted

## 2022-03-09 ENCOUNTER — Inpatient Hospital Stay: Payer: Medicare Other | Admitting: Licensed Clinical Social Worker

## 2022-03-09 ENCOUNTER — Telehealth: Payer: Self-pay

## 2022-03-09 DIAGNOSIS — C50919 Malignant neoplasm of unspecified site of unspecified female breast: Secondary | ICD-10-CM

## 2022-03-09 DIAGNOSIS — C7951 Secondary malignant neoplasm of bone: Secondary | ICD-10-CM

## 2022-03-09 DIAGNOSIS — C50911 Malignant neoplasm of unspecified site of right female breast: Secondary | ICD-10-CM

## 2022-03-09 LAB — CANCER ANTIGEN 27.29: CA 27.29: 123.8 U/mL — ABNORMAL HIGH (ref 0.0–38.6)

## 2022-03-09 NOTE — Telephone Encounter (Signed)
Oral Oncology Patient Advocate Encounter   Received notification that the application for assistance for Ibrance through [employer] Patient Assistance Program has been approved.  I reached out to [employer] in January to cancel enrollment due to securing a grant for patient. They still processed application and approved it on 02.16.24. I have called back and once again placed on hold since patient will be filling with [facility] while using grant.    [employer]'s phone number [phone].   Effective dates: 03/02/22 through 01/15/23   Berdine Addison, [place] Patient [place]  [phone] (phone) [phone] (fax) 03/09/2022 12:06 PM

## 2022-03-09 NOTE — Progress Notes (Signed)
[facility] Clinical Social Work  Clinical Social Work was referred by medical provider for assessment of psychosocial needs.  Clinical Social Worker attempted to contact patient by phone  to offer support and assess for needs.  CSW left voicemail with contact information and request for return call.   FA   Cobey Raineri, LCSW  Clinical Social Worker [facility]          

## 2022-03-09 NOTE — Progress Notes (Signed)
[place] Work  Initial Assessment   Alicia [name] is a 72 y.o. year old female contacted by phone. Clinical Social Work was referred by medical provider for assessment of psychosocial needs.   SDOH (Social Determinants of Health) assessments performed: Yes SDOH Interventions    Flowsheet Row Clinical Support from 03/09/2022 in [place] [facility] [place] from 09/22/2020 in [facility]  SDOH Interventions    Food Insecurity Interventions Other (Comment), [id number] Referral  [Referral for food assitance w/ food pantry] Intervention Not Indicated  Housing Interventions Intervention Not Indicated Intervention Not Indicated  Transportation Interventions Patient Resources (Friends/Family), Intervention Not Indicated Intervention Not Indicated  Alcohol Usage Interventions Intervention Not Indicated (Score <7) --  Depression Interventions/Treatment  Counseling --  Financial Strain Interventions Other (Comment)  [[employer] assistance and referral to fund grant] Intervention Not Indicated  Physical Activity Interventions Intervention Not Indicated, Other (Comments)  [CARE program referral] Intervention Not Indicated  Stress Interventions Intervention Not Indicated Intervention Not Indicated  Social Connections Interventions Intervention Not Indicated Intervention Not Indicated       SDOH Screenings   Food Insecurity: Food Insecurity Present (03/09/2022)  Housing: Medium Risk (03/09/2022)  Transportation Needs: No Transportation Needs (03/09/2022)  Utilities: Not At Risk (03/09/2022)  Alcohol Screen: Low Risk  (03/09/2022)  Depression (PHQ2-9): Low Risk  (03/09/2022)  Financial Resource Strain: High Risk (03/09/2022)  Physical Activity: Insufficiently Active (03/09/2022)  Social Connections: Socially Isolated (03/09/2022)  Stress: No Stress Concern Present (03/09/2022)  Tobacco Use: Medium Risk (03/08/2022)     Distress  Screen completed: Yes    07/25/2017    3:36 PM  ONCBCN DISTRESS SCREENING  Screening Type Initial Screening  Distress experienced in past week (1-10) 2      Family/Social Information:  Housing Arrangement: patient lives alone main contact is son Weldon Picking [phone]  Family members/support persons in your life? Family, Medical Staff, and patient has limited familial support. Transportation concerns: no  Employment: Retired  .  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 yes Concerns about diagnosis and/or treatment: How will I care for myself, Quality of life, and concerns about everyday expenses and affordability Patient reported stressors: Finances, Food, and Isolation/ feeling alone Hopes and/or priorities: to receive financial assistance for medicare premiums Patient enjoys  N/A Current coping skills/ strengths: Average or above average intelligence , Communication skills , General fund of knowledge , Motivation for treatment/growth , Religious Affiliation , Supportive family/friends , and Work skills     SUMMARY: Current SDOH Barriers:  Financial constraints related to fixed income, Limited social support, Level of care concerns, ADL IADL limitations, and Social Isolation  Clinical Social Work Clinical Goal(s):  Patient will work with SW to address concerns related to financial concerns Patient will follow up with Medicaid CAP programs, and Medicare office to confirm her coverage* as  directed by SW  Interventions: Discussed common feeling and emotions when being diagnosed with cancer, and the importance of support during treatment Informed patient of the support team roles and support services at [facility] Provided CSW contact information and encouraged patient to  call with any questions or concerns Referred patient to [facility] in [place], [employer], food assistance program, CARE program and Provided patient with information about [employer], fund grant , CSW role in patient care and other available resources.  Patient is concerned about general affordability of everything, and is experiencing social isolation, patient expressed food insecurity and concerns about medicare premium assistance. CSW will contact [employer] to request information on medicare premium assistance which the patient received last year.  CSW will contact patient with reply from [facility]. Patient does not qualify for Medicaid or food stamps.   Follow Up Plan: Patient will contact CSW with any support or resource needs and CSW will follow-up with patient by phone  Patient verbalizes understanding of plan: Yes    Adelene Amas, LCSW

## 2022-03-12 ENCOUNTER — Other Ambulatory Visit (HOSPITAL_COMMUNITY): Payer: Self-pay

## 2022-03-12 ENCOUNTER — Encounter: Payer: Self-pay | Admitting: Oncology

## 2022-03-12 NOTE — Progress Notes (Signed)
Spoke with patient about [employer], she will sign when she is in on 03/29/22. She will have to wait until August of this year to reapply for the [facility], that will be two years.

## 2022-03-12 NOTE — Progress Notes (Signed)
Social worker felt Alicia [name] would benefit from CARE program referral.   Ok to place order per Dr. Grayland Ormond, no restrictions.

## 2022-03-13 ENCOUNTER — Other Ambulatory Visit (HOSPITAL_COMMUNITY): Payer: Self-pay

## 2022-03-19 ENCOUNTER — Other Ambulatory Visit (HOSPITAL_COMMUNITY): Payer: Self-pay

## 2022-03-20 ENCOUNTER — Other Ambulatory Visit: Payer: Self-pay

## 2022-03-29 ENCOUNTER — Ambulatory Visit
Admission: RE | Admit: 2022-03-29 | Discharge: 2022-03-29 | Disposition: A | Discharge status: Home / Self Care | Payer: Medicare Other | Source: Ambulatory Visit | Attending: Oncology | Admitting: Oncology

## 2022-03-29 DIAGNOSIS — C50912 Malignant neoplasm of unspecified site of left female breast: Secondary | ICD-10-CM | POA: Diagnosis not present

## 2022-03-29 DIAGNOSIS — I700 Atherosclerosis of aorta: Secondary | ICD-10-CM | POA: Diagnosis not present

## 2022-03-29 DIAGNOSIS — C7981 Secondary malignant neoplasm of breast: Secondary | ICD-10-CM | POA: Diagnosis not present

## 2022-03-29 DIAGNOSIS — C7951 Secondary malignant neoplasm of bone: Secondary | ICD-10-CM | POA: Insufficient documentation

## 2022-03-29 DIAGNOSIS — C801 Malignant (primary) neoplasm, unspecified: Secondary | ICD-10-CM | POA: Diagnosis not present

## 2022-03-29 LAB — GLUCOSE, CAPILLARY: Glucose-Capillary: 95 mg/dL (ref 70–99)

## 2022-03-29 MED ORDER — FLUDEOXYGLUCOSE F - 18 (FDG) INJECTION
10.9000 | Freq: Once | INTRAVENOUS | Status: AC | PRN
  Administered 2022-03-29: 11.4 via INTRAVENOUS

## 2022-04-02 ENCOUNTER — Telehealth: Payer: Self-pay | Admitting: *Deleted

## 2022-04-02 NOTE — Telephone Encounter (Signed)
I spoke with patient and advised that she contact Dr Jennette Kettle office regarding her concern and her b/p She stated that she had called his office before calling ours and she has still not heard back from them. I advised she try again now and tomorrow if no response today as she states that they are only in the office  a few days a week.

## 2022-04-02 NOTE — Telephone Encounter (Signed)
Patient called reporting that yesterday, she had an episode where her BP and Pulse went up 190/95 p- 125 She was able to get it to come down to normal with use of potassium pills and baby aspirin. Today she awoke with a headache and indigestion. She is asking if she needs to e seen. She states she has not checked her b/p this morning, but dies not feel it is up. I asked she check it while I was on the phone with her and it is 149/58 p- 93.  Please advise

## 2022-04-05 ENCOUNTER — Other Ambulatory Visit: Payer: Self-pay

## 2022-04-05 ENCOUNTER — Other Ambulatory Visit (HOSPITAL_COMMUNITY): Payer: Self-pay

## 2022-04-05 ENCOUNTER — Encounter: Payer: Self-pay | Admitting: Oncology

## 2022-04-05 ENCOUNTER — Inpatient Hospital Stay: Payer: Medicare Other

## 2022-04-05 ENCOUNTER — Inpatient Hospital Stay: Payer: Medicare Other | Attending: Oncology

## 2022-04-05 ENCOUNTER — Inpatient Hospital Stay (HOSPITAL_BASED_OUTPATIENT_CLINIC_OR_DEPARTMENT_OTHER): Payer: Medicare Other | Admitting: Oncology

## 2022-04-05 ENCOUNTER — Other Ambulatory Visit: Payer: Self-pay | Admitting: Oncology

## 2022-04-05 ENCOUNTER — Telehealth: Payer: Self-pay

## 2022-04-05 ENCOUNTER — Inpatient Hospital Stay: Payer: Medicare Other | Admitting: Pharmacist

## 2022-04-05 VITALS — BP 138/64 | HR 73 | Temp 96.4°F | Resp 16 | Ht 66.0 in | Wt 210.0 lb

## 2022-04-05 DIAGNOSIS — G629 Polyneuropathy, unspecified: Secondary | ICD-10-CM | POA: Insufficient documentation

## 2022-04-05 DIAGNOSIS — C7951 Secondary malignant neoplasm of bone: Secondary | ICD-10-CM

## 2022-04-05 DIAGNOSIS — D72819 Decreased white blood cell count, unspecified: Secondary | ICD-10-CM | POA: Diagnosis not present

## 2022-04-05 DIAGNOSIS — R109 Unspecified abdominal pain: Secondary | ICD-10-CM | POA: Insufficient documentation

## 2022-04-05 DIAGNOSIS — F419 Anxiety disorder, unspecified: Secondary | ICD-10-CM

## 2022-04-05 DIAGNOSIS — Z87891 Personal history of nicotine dependence: Secondary | ICD-10-CM | POA: Insufficient documentation

## 2022-04-05 DIAGNOSIS — M8580 Other specified disorders of bone density and structure, unspecified site: Secondary | ICD-10-CM | POA: Diagnosis not present

## 2022-04-05 DIAGNOSIS — C50919 Malignant neoplasm of unspecified site of unspecified female breast: Secondary | ICD-10-CM

## 2022-04-05 DIAGNOSIS — Z170 Estrogen receptor positive status [ER+]: Secondary | ICD-10-CM | POA: Insufficient documentation

## 2022-04-05 DIAGNOSIS — C50912 Malignant neoplasm of unspecified site of left female breast: Secondary | ICD-10-CM | POA: Diagnosis not present

## 2022-04-05 DIAGNOSIS — D649 Anemia, unspecified: Secondary | ICD-10-CM | POA: Diagnosis not present

## 2022-04-05 DIAGNOSIS — M25552 Pain in left hip: Secondary | ICD-10-CM | POA: Diagnosis not present

## 2022-04-05 DIAGNOSIS — N189 Chronic kidney disease, unspecified: Secondary | ICD-10-CM | POA: Diagnosis not present

## 2022-04-05 DIAGNOSIS — G8929 Other chronic pain: Secondary | ICD-10-CM | POA: Insufficient documentation

## 2022-04-05 LAB — COMPREHENSIVE METABOLIC PANEL
ALT: 12 U/L (ref 0–44)
AST: 23 U/L (ref 15–41)
Albumin: 3.8 g/dL (ref 3.5–5.0)
Alkaline Phosphatase: 96 U/L (ref 38–126)
Anion gap: 9 (ref 5–15)
BUN: 19 mg/dL (ref 8–23)
CO2: 25 mmol/L (ref 22–32)
Calcium: 9.7 mg/dL (ref 8.9–10.3)
Chloride: 101 mmol/L (ref 98–111)
Creatinine, Ser: 1.54 mg/dL — ABNORMAL HIGH (ref 0.44–1.00)
GFR, Estimated: 36 mL/min — ABNORMAL LOW (ref >60)
Glucose, Bld: 116 mg/dL — ABNORMAL HIGH (ref 70–99)
Potassium: 3.7 mmol/L (ref 3.5–5.1)
Sodium: 135 mmol/L (ref 135–145)
Total Bilirubin: 0.9 mg/dL (ref 0.3–1.2)
Total Protein: 7.5 g/dL (ref 6.5–8.1)

## 2022-04-05 LAB — CBC WITH DIFFERENTIAL/PLATELET
Abs Immature Granulocytes: 0 10*3/uL (ref 0.00–0.07)
Band Neutrophils: 2 %
Basophils Absolute: 0 10*3/uL (ref 0.0–0.1)
Basophils Relative: 2 %
Eosinophils Absolute: 0.1 10*3/uL (ref 0.0–0.5)
Eosinophils Relative: 3 %
HCT: 28.8 % — ABNORMAL LOW (ref 36.0–46.0)
Hemoglobin: 9.3 g/dL — ABNORMAL LOW (ref 12.0–15.0)
Lymphocytes Relative: 14 %
Lymphs Abs: 0.3 10*3/uL — ABNORMAL LOW (ref 0.7–4.0)
MCH: 35.1 pg — ABNORMAL HIGH (ref 26.0–34.0)
MCHC: 32.3 g/dL (ref 30.0–36.0)
MCV: 108.7 fL — ABNORMAL HIGH (ref 80.0–100.0)
Monocytes Absolute: 0.3 10*3/uL (ref 0.1–1.0)
Monocytes Relative: 13 %
Neutro Abs: 1.6 10*3/uL — ABNORMAL LOW (ref 1.7–7.7)
Neutrophils Relative %: 66 %
Platelets: 175 10*3/uL (ref 150–400)
RBC: 2.65 MIL/uL — ABNORMAL LOW (ref 3.87–5.11)
RDW: 14.6 % (ref 11.5–15.5)
Smear Review: NORMAL
WBC: 2.4 10*3/uL — ABNORMAL LOW (ref 4.0–10.5)
nRBC: 0 % (ref 0.0–0.2)

## 2022-04-05 MED ORDER — EXEMESTANE 25 MG PO TABS
25.0000 mg | ORAL_TABLET | Freq: Every day | ORAL | 2 refills | Status: DC
  Filled 2022-04-05 – 2022-04-06 (×2): qty 30, 30d supply, fill #0
  Filled 2022-04-30: qty 30, 30d supply, fill #1
  Filled 2022-06-01: qty 30, 30d supply, fill #2

## 2022-04-05 MED ORDER — EVEROLIMUS 10 MG PO TABS
10.0000 mg | ORAL_TABLET | Freq: Every day | ORAL | 1 refills | Status: DC
  Filled 2022-04-05 – 2022-04-06 (×2): qty 28, 28d supply, fill #0
  Filled 2022-04-30: qty 28, 28d supply, fill #1

## 2022-04-05 MED ORDER — DEXAMETHASONE 0.5 MG/5ML PO SOLN: ORAL | 5 refills | Status: DC

## 2022-04-05 MED ORDER — ONDANSETRON HCL 8 MG PO TABS: 8.0000 mg | ORAL_TABLET | Freq: Three times a day (TID) | ORAL | 2 refills | Status: DC | PRN

## 2022-04-05 MED ORDER — ALPRAZOLAM 0.5 MG PO TABS: 0.5000 mg | ORAL_TABLET | Freq: Two times a day (BID) | ORAL | 0 refills | Status: DC

## 2022-04-05 MED ORDER — PANTOPRAZOLE SODIUM 20 MG PO TBEC: 20.0000 mg | DELAYED_RELEASE_TABLET | Freq: Every day | ORAL | 1 refills | Status: DC

## 2022-04-05 NOTE — Telephone Encounter (Signed)
Oral Oncology Patient Advocate Encounter  Prior Authorization for Everolimus has been approved.    PA# [id number]  Effective dates: 04/05/22 through 01/15/23  Patients co-pay is $0.00.    Berdine Addison, [facility] Patient [place]  [phone] (phone) [phone] (fax) 04/05/2022 12:59 PM

## 2022-04-05 NOTE — Progress Notes (Signed)
Would like a refill of xanax. Has not been filled by [facility] Ormond in the past. Would like to get an rx for omeprazole for acid reflux.

## 2022-04-05 NOTE — Progress Notes (Signed)
[place]  [phone] [phone]) [phone]  Patient Care Team: [name], [name], Harrell Gave, [name] Rico Junker, RN as Registered Nurse Grayland Ormond Kathlene November, MD as Consulting Physician (Oncology) Bary Castilla Forest Gleason, MD as Consulting Physician (General Surgery) Bary Castilla Forest Gleason, MD as Consulting Physician (General Surgery) Noreene Filbert, MD as Referring Physician (Radiation Oncology) Jeral Fruit, RN as Registered Nurse   Name of the patient: Alicia [name]  XT:[id number]  [birth year]   Date of visit: 04/05/22  HPI: Patient is a 72 y.o. female with metastatic ER+ HER2- breast cancer. Currently on Ibrance but d/t disease progression seen on PET (03/29/22) she will be transitioned to treatment with Afinitor (everolimus) and exemestane.    Reason for Consult: Afinitor (everolimus) oral chemotherapy education.   PAST MEDICAL HISTORY: Past Medical History:  Diagnosis Date   Anxiety    Breast cancer, left ([place]) 07/2017   Depression    Hypertension    Hypothyroidism    Personal history of chemotherapy 2019   LEFT mastectomy-chemo before   Personal history of radiation therapy 03/2018   LEFT mastectomy   Rapid heart rate    Thyroid disease     HEMATOLOGY/ONCOLOGY HISTORY:  Oncology History Overview Note  Patient is a 72 year old female who recently self palpated a mass on her left breast.  Subsequent imaging and biopsy revealed the above-stated breast cancer.  Case was also discussed extensively at case conference.  Given the size and the stage of patient's malignancy, she will benefit from neoadjuvant chemotherapy using Adriamycin, Cytoxan, and Taxol.  Patient will also require Neulasta support.  Will get CT scan of the chest, abdomen, and pelvis to assess for any metastatic disease.  Patient will also require port placement and MUGA prior to  initiating treatment  CT abdomen/pelvis/chest did not reveal any suspicious lesions concerning for metastatic disease. (08/01/17)  Port-A-Cath placed on 08/07/2017.  Cycle 1 day 1 of AC was given on 08/08/17.     Breast cancer, stage 2, left ([place])  07/24/2017 Initial Diagnosis   Breast cancer, stage 2, left ([place])   07/30/2017 Cancer Staging   Staging form: Breast, AJCC 8th Edition - Clinical stage from 07/30/2017: Stage IIA (cT2, cN1, cM0, G2, ER+, PR+, HER2-) - Signed by Lloyd Huger, MD on 07/30/2017   08/08/2017 - 10/31/2017 Chemotherapy   The patient had DOXOrubicin (ADRIAMYCIN) chemo injection 130 mg, 60 mg/m2 = 130 mg, Intravenous,  Once, 4 of 4 cycles Administration: 130 mg (08/08/2017), 130 mg (08/22/2017), 130 mg (09/05/2017), 130 mg (09/19/2017) palonosetron (ALOXI) injection 0.25 mg, 0.25 mg, Intravenous,  Once, 4 of 4 cycles Administration: 0.25 mg (08/08/2017), 0.25 mg (08/22/2017), 0.25 mg (09/05/2017), 0.25 mg (09/19/2017) pegfilgrastim-cbqv (UDENYCA) injection 6 mg, 6 mg, Subcutaneous, Once, 4 of 4 cycles Administration: 6 mg (08/09/2017), 6 mg (08/23/2017), 6 mg (09/06/2017), 6 mg (09/20/2017) cyclophosphamide (CYTOXAN) 1,300 mg in sodium chloride 0.9 % 250 mL chemo infusion, 600 mg/m2 = 1,300 mg, Intravenous,  Once, 4 of 4 cycles Administration: 1,300 mg (08/08/2017), 1,300 mg (08/22/2017), 1,300 mg (09/05/2017), 1,300 mg (09/19/2017) PACLitaxel (TAXOL) 174 mg in sodium chloride 0.9 % 250 mL chemo infusion (</= 80mg /m2), 80 mg/m2 = 174 mg, Intravenous,  Once, 4 of 12 cycles Dose modification: 72 mg/m2 (original dose 80 mg/m2, Cycle 6, Reason: Dose not tolerated) Administration: 174 mg (10/03/2017), 156 mg (10/17/2017), 156 mg (10/24/2017), 156 mg (10/31/2017) fosaprepitant (EMEND) 150 mg, dexamethasone (DECADRON) 12  mg in sodium chloride 0.9 % 145 mL IVPB, , Intravenous,  Once, 4 of 4 cycles Administration:  (08/08/2017),  (08/22/2017),  (09/05/2017),  (09/19/2017)  for chemotherapy treatment.       ALLERGIES:  is allergic to sulfa antibiotics.  MEDICATIONS:  Current Outpatient Medications  Medication Sig Dispense Refill   dexamethasone (DECADRON) 0.5 MG/5ML solution Swish 10 mLs (1 mg total) by mouth for 2 mins, then spit. Swish 4 (four) times daily. 500 mL 5   ondansetron (ZOFRAN) 8 MG tablet Take 1 tablet (8 mg total) by mouth every 8 (eight) hours as needed for nausea or vomiting. 20 tablet 2   acetaminophen (TYLENOL) 500 MG tablet Take 1,000 mg by mouth every 6 (six) hours as needed for moderate pain or headache.      ALPRAZolam (XANAX) 0.5 MG tablet TAKE 1 TABLET BY MOUTH TWICE DAILY 60 tablet 0   amLODipine (NORVASC) 5 MG tablet Take 1 tablet (5 mg total) by mouth daily. 90 tablet 3   aspirin EC 81 MG tablet Take 81 mg by mouth at bedtime.      calcium carbonate (OS-CAL - DOSED IN MG OF ELEMENTAL CALCIUM) 1250 (500 Ca) MG tablet Take 1 tablet by mouth daily with breakfast.     chlorhexidine (PERIDEX) 0.12 % solution SMARTSIG:By Mouth     Cholecalciferol (VITAMIN D3) 125 MCG (5000 UT) CAPS Take 1 capsule by mouth 2 (two) times a day.     docusate sodium (COLACE) 100 MG capsule Take 100 mg by mouth 3 (three) times daily as needed for mild constipation.     Erythromycin 500 MG TBEC Take 1 tablet by mouth 3 (three) times daily. (Patient not taking: Reported on 02/08/2022)     hydrochlorothiazide (HYDRODIURIL) 12.5 MG tablet TAKE 1 TABLET(12.5 MG) BY MOUTH DAILY 90 tablet 3   levothyroxine (SYNTHROID) 100 MCG tablet TAKE 1 TABLET BY MOUTH DAILY 90 tablet 3   nystatin-lidocaine-prednisoLONE-diphenhydrAMINE-distilled water-alum & mag hydroxide-simeth Swish and spit 5-65ml four times a day as needed (Patient not taking: Reported on 02/08/2022) 480 mL 3   olopatadine (PATANOL) 0.1 % ophthalmic solution Place 1 drop into the right eye 2 (two) times daily. 5 mL 12   palbociclib (IBRANCE) 125 MG tablet Take 1 tablet (125 mg total) by mouth daily. Take for 21 days, then hold for 7 days. Repeat  every 28 days. 21 tablet 3   pantoprazole (PROTONIX) 20 MG tablet Take 1 tablet (20 mg total) by mouth daily. 30 tablet 1   potassium chloride SA (KLOR-CON M) 20 MEQ tablet TAKE 1 TABLET BY MOUTH TWICE DAILY 60 tablet 3   PREVIDENT 5000 PLUS 1.1 % CREA dental cream SMARTSIG:Application By Mouth     Probiotic Product (ALIGN PO) Take by mouth.     rosuvastatin (CRESTOR) 5 MG tablet Take 1 tablet (5 mg total) by mouth daily. 90 tablet 3   traMADol (ULTRAM) 50 MG tablet Take 1 tablet (50 mg total) by mouth every 6 (six) hours as needed. 60 tablet 0   traZODone (DESYREL) 50 MG tablet TAKE 1 TABLET BY MOUTH DAILY 30 tablet 6   triamcinolone cream (KENALOG) 0.1 % Apply 1 application. topically 2 (two) times daily. 30 g 0   No current facility-administered medications for this visit.    VITAL SIGNS: There were no vitals taken for this visit. There were no vitals filed for this visit.  Estimated body mass index is 33.89 kg/m as calculated from the following:   Height as of  an earlier encounter on 04/05/22: 5\' 6"  (1.676 m).   Weight as of an earlier encounter on 04/05/22: 95.3 kg (210 lb).  LABS: CBC:    Component Value Date/Time   WBC 2.4 (L) 04/05/2022 1009   HGB 9.3 (L) 04/05/2022 1009   HGB 14.2 02/11/2011 1349   HCT 28.8 (L) 04/05/2022 1009   HCT 40.9 02/11/2011 1349   PLT 175 04/05/2022 1009   PLT 151 02/11/2011 1349   MCV 108.7 (H) 04/05/2022 1009   MCV 89 02/11/2011 1349   NEUTROABS 1.6 (L) 04/05/2022 1009   LYMPHSABS 0.3 (L) 04/05/2022 1009   MONOABS 0.3 04/05/2022 1009   EOSABS 0.1 04/05/2022 1009   BASOSABS 0.0 04/05/2022 1009   Comprehensive Metabolic Panel:    Component Value Date/Time   NA 135 04/05/2022 1009   NA 142 02/11/2011 1349   K 3.7 04/05/2022 1009   K 3.8 02/11/2011 1349   CL 101 04/05/2022 1009   CL 107 02/11/2011 1349   CO2 25 04/05/2022 1009   CO2 24 02/11/2011 1349   BUN 19 04/05/2022 1009   BUN 14 02/11/2011 1349   CREATININE 1.54 (H) 04/05/2022  1009   CREATININE 0.65 02/11/2011 1349   GLUCOSE 116 (H) 04/05/2022 1009   GLUCOSE 98 02/11/2011 1349   CALCIUM 9.7 04/05/2022 1009   CALCIUM 9.0 02/11/2011 1349   AST 23 04/05/2022 1009   AST 27 02/11/2011 1349   ALT 12 04/05/2022 1009   ALT 36 02/11/2011 1349   ALKPHOS 96 04/05/2022 1009   ALKPHOS 122 02/11/2011 1349   BILITOT 0.9 04/05/2022 1009   BILITOT 0.5 02/11/2011 1349   PROT 7.5 04/05/2022 1009   PROT 7.0 02/11/2011 1349   ALBUMIN 3.8 04/05/2022 1009   ALBUMIN 4.1 02/11/2011 1349     Present during today's visit: patient and her daughter in law  Start plan: Patient will start her everolimus and exemestane when she has it in hand, likely new week   Patient Education I spoke with patient for overview of new oral chemotherapy medication: everolimus   CBC from 04/05/22 assessed, no relevant lab abnormalities. Prescription dose and frequency assessed.   Administration: Counseled patient on administration, dosing, side effects, monitoring, drug-food interactions, safe handling, storage, and disposal. Patient will take: Everolimus: Take 1 tablet (10 mg total) by mouth daily.  Exemestane: Take 1 tablet (25 mg total) by mouth daily after breakfast.   Side Effects: Side effects include but not limited to: rash/itchy skin, nausea, mouth sores, diarrhea, fatigue. Mouth sores: prescription for dexamethasone mouthwash sent to patient's local pharmacy Nausea: prescription sent to ondansetron sent to patient's local pharmacy Diarrhea: currently patient is experiencing constipation and using docusate for management. She knows to stop docusate, start loperamide, and call the office if she is having 4 or more loose stools    Drug-drug Interactions (DDI) with everolimus: Alprazolam: everolimus may increase the serum concentration of alprazolam.Monitor for increased alprazolam effects and toxicities (eg, sedation, lethargy. No baseline dose adjustment needed.  Adherence: After  discussion with patient no patient barriers to medication adherence identified.  Reviewed with patient importance of keeping a medication schedule and plan for any missed doses.  Ms. Roubideaux voiced understanding and appreciation. All questions answered. Medication handout provided.  Provided patient with [facility] phone number. Patient knows to call the office with questions or concerns. [facility] will continue to follow.  Patient expressed understanding and was in agreement with this plan. She also understands that She  can call clinic at any time with any questions, concerns, or complaints.   Medication Access Issues: Patient will fill at [facility]  Follow-up plan: RTC in ~4 weeks  Thank you for allowing me to participate in the care of this patient.   Time Total: 30 mins  Visit consisted of counseling and education on dealing with issues of symptom management in the setting of serious and potentially life-threatening illness.Greater than 50%  of this time was spent counseling and coordinating care related to the above assessment and plan.  Signed by: Darl Pikes, PharmD, BCPS, Salley Slaughter, CPP Hematology/Oncology Clinical Pharmacist Practitioner [place]/DB/AP [facility] [phone]  04/05/2022 11:57 AM

## 2022-04-05 NOTE — Progress Notes (Signed)
[facility]  [phone]) [phone] [phone]  ID: FLORIANA CORBRIDGE OB: 12-Jun-1950  MR#: [id number]  [id number]  Patient Care Team: [name], [name], Harrell Gave, [name] Rico Junker, RN as Registered Nurse Lloyd Huger, MD as Consulting Physician (Oncology) Bary Castilla Forest Gleason, MD as Consulting Physician (General Surgery) Bary Castilla Forest Gleason, MD as Consulting Physician (General Surgery) Noreene Filbert, MD as Referring Physician (Radiation Oncology) Jeral Fruit, RN as Registered Nurse   CHIEF COMPLAINT: Recurrent stage IV ER/PR positive, 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  INTERVAL HISTORY: Patient returns to clinic today for further evaluation, discussion of her PET scan results, and treatment planning.  Her jaw pain has significantly improved now that she has had a tooth extraction.  She continues to have left hip pain.  She does not complain of weakness or fatigue.  She continues to have a mild peripheral neuropathy, but no other neurologic complaints.  She denies any chest pain, shortness of breath, cough, or hemoptysis.  She denies any nausea, vomiting, constipation, or diarrhea.  She has no urinary complaints.  Patient offers no further specific complaints today.  REVIEW OF SYSTEMS:   Review of Systems  Constitutional: Negative.  Negative for fever, malaise/fatigue and weight loss.  HENT:  Negative for congestion.   Respiratory: Negative.  Negative for cough and shortness of breath.   Cardiovascular: Negative.  Negative for chest pain and leg swelling.  Gastrointestinal: Negative.  Negative for abdominal pain, constipation, diarrhea and nausea.  Genitourinary: Negative.  Negative for dysuria, flank pain and urgency.  Musculoskeletal:  Positive for joint pain. Negative for back pain.  Skin: Negative.  Negative for rash.  Neurological:   Positive for tingling and sensory change. Negative for dizziness, focal weakness, weakness and headaches.  Psychiatric/Behavioral:  The patient is not nervous/anxious.     As per HPI. Otherwise, a complete review of systems is negative.  PAST MEDICAL HISTORY: Past Medical History:  Diagnosis Date   Anxiety    Breast cancer, left ([place]) 07/2017   Depression    Hypertension    Hypothyroidism    Personal history of chemotherapy 2019   LEFT mastectomy-chemo before   Personal history of radiation therapy 03/2018   LEFT mastectomy   Rapid heart rate    Thyroid disease     PAST SURGICAL HISTORY: Past Surgical History:  Procedure Laterality Date   AXILLARY LYMPH NODE BIOPSY Left 07/16/2017   METASTATIC MAMMARY CARCINOMA   BREAST BIOPSY Left 07/16/2017   [place] bx of left breast mass and left breast LN.  INVASIVE MAMMARY CARCINOMA, NO SPECIAL TYPE.    BREAST EXCISIONAL BIOPSY Right 2001   benign   BREAST LUMPECTOMY WITH SENTINEL LYMPH NODE BIOPSY Left 12/06/2017   Procedure: BREAST LUMPECTOMY WITH SENTINEL LYMPH NODE BX;  Surgeon: Robert Bellow, MD;  Location: [facility] ORS;  Service: General;  Laterality: Left;   COLONOSCOPY     MASTECTOMY Left 12/23/2017   PORTACATH PLACEMENT Right 08/07/2017   Procedure: INSERTION PORT-A-CATH;  Surgeon: Robert Bellow, MD;  Location: [facility] OR[facility];  Service: General;  Laterality: Right;   SIMPLE MASTECTOMY WITH AXILLARY SENTINEL NODE BIOPSY Left 12/23/2017   T2,N2 with 6/7 nodes positive. Whole breast radiation.  Surgeon: Robert Bellow, MD;  Location: [facility] OR[facility];  Service: General;  Laterality: Left;   TONSILLECTOMY      FAMILY HISTORY: Family History  Problem Relation Age of Onset   Stroke  Mother    Thyroid disease Mother    Renal Disease Mother    Stroke Father    Heart attack Father    Sudden death Father 77       suicide   Anuerysm Brother    Breast cancer Neg Hx     ADVANCED DIRECTIVES (Y/N):  N  HEALTH MAINTENANCE: Social History    Tobacco Use   Smoking status: Former    Packs/day: 1.00    Years: 30.00    Additional pack years: 0.00    Total pack years: 30.00    Types: Cigarettes    Quit date: 2019    Years since quitting: 5.2   Smokeless tobacco: Never  Vaping Use   Vaping Use: Never used  Substance Use Topics   Alcohol use: Not Currently   Drug use: Never     Colonoscopy:  PAP:  Bone density:  Lipid panel:  Allergies  Allergen Reactions   Sulfa Antibiotics Diarrhea    Current Outpatient Medications  Medication Sig Dispense Refill   acetaminophen (TYLENOL) 500 MG tablet Take 1,000 mg by mouth every 6 (six) hours as needed for moderate pain or headache.      ALPRAZolam (XANAX) 0.5 MG tablet TAKE 1 TABLET BY MOUTH TWICE DAILY 60 tablet 0   amLODipine (NORVASC) 5 MG tablet Take 1 tablet (5 mg total) by mouth daily. 90 tablet 3   aspirin EC 81 MG tablet Take 81 mg by mouth at bedtime.      calcium carbonate (OS-CAL - DOSED IN MG OF ELEMENTAL CALCIUM) 1250 (500 Ca) MG tablet Take 1 tablet by mouth daily with breakfast.     chlorhexidine (PERIDEX) 0.12 % solution SMARTSIG:By Mouth     Cholecalciferol (VITAMIN D3) 125 MCG (5000 UT) CAPS Take 1 capsule by mouth 2 (two) times a day.     docusate sodium (COLACE) 100 MG capsule Take 100 mg by mouth 3 (three) times daily as needed for mild constipation.     hydrochlorothiazide (HYDRODIURIL) 12.5 MG tablet TAKE 1 TABLET(12.5 MG) BY MOUTH DAILY 90 tablet 3   levothyroxine (SYNTHROID) 100 MCG tablet TAKE 1 TABLET BY MOUTH DAILY 90 tablet 3   olopatadine (PATANOL) 0.1 % ophthalmic solution Place 1 drop into the right eye 2 (two) times daily. 5 mL 12   palbociclib (IBRANCE) 125 MG tablet Take 1 tablet (125 mg total) by mouth daily. Take for 21 days, then hold for 7 days. Repeat every 28 days. 21 tablet 3   pantoprazole (PROTONIX) 20 MG tablet Take 1 tablet (20 mg total) by mouth daily. 30 tablet 1   potassium chloride SA (KLOR-CON M) 20 MEQ tablet TAKE 1 TABLET BY  MOUTH TWICE DAILY 60 tablet 3   PREVIDENT 5000 PLUS 1.1 % CREA dental cream SMARTSIG:Application By Mouth     Probiotic Product (ALIGN PO) Take by mouth.     rosuvastatin (CRESTOR) 5 MG tablet Take 1 tablet (5 mg total) by mouth daily. 90 tablet 3   traMADol (ULTRAM) 50 MG tablet Take 1 tablet (50 mg total) by mouth every 6 (six) hours as needed. 60 tablet 0   traZODone (DESYREL) 50 MG tablet TAKE 1 TABLET BY MOUTH DAILY 30 tablet 6   triamcinolone cream (KENALOG) 0.1 % Apply 1 application. topically 2 (two) times daily. 30 g 0   dexamethasone (DECADRON) 0.5 MG/5ML solution Swish 10 mLs (1 mg total) by mouth for 2 mins, then spit. Swish 4 (four) times daily. 500 mL 5  Erythromycin 500 MG TBEC Take 1 tablet by mouth 3 (three) times daily. (Patient not taking: Reported on 02/08/2022)     nystatin-lidocaine-prednisoLONE-diphenhydrAMINE-distilled water-alum & mag hydroxide-simeth Swish and spit 5-84ml four times a day as needed (Patient not taking: Reported on 02/08/2022) 480 mL 3   ondansetron (ZOFRAN) 8 MG tablet Take 1 tablet (8 mg total) by mouth every 8 (eight) hours as needed for nausea or vomiting. 20 tablet 2   No current facility-administered medications for this visit.    OBJECTIVE: Vitals:   04/05/22 1032  BP: 138/64  Pulse: 73  Resp: 16  Temp: (!) 96.4 F (35.8 C)  SpO2: 98%     Body mass index is 33.89 kg/m.    ECOG FS:1 - Symptomatic but completely ambulatory  General: Well-developed, well-nourished, no acute distress. Eyes: Pink conjunctiva, anicteric sclera. HEENT: Normocephalic, moist mucous membranes. Jaw swelling significantly improved. Lungs: No audible wheezing or coughing. Heart: Regular rate and rhythm. Abdomen: Soft, nontender, no obvious distention. Musculoskeletal: No edema, cyanosis, or clubbing. Neuro: Alert, answering all questions appropriately. Cranial nerves grossly intact. Skin: No rashes or petechiae noted. Psych: Normal affect.  LAB RESULTS:  Lab  Results  Component Value Date   NA 135 04/05/2022   K 3.7 04/05/2022   CL 101 04/05/2022   CO2 25 04/05/2022   GLUCOSE 116 (H) 04/05/2022   BUN 19 04/05/2022   CREATININE 1.54 (H) 04/05/2022   CALCIUM 9.7 04/05/2022   PROT 7.5 04/05/2022   ALBUMIN 3.8 04/05/2022   AST 23 04/05/2022   ALT 12 04/05/2022   ALKPHOS 96 04/05/2022   BILITOT 0.9 04/05/2022   GFRNONAA 36 (L) 04/05/2022   GFRAA 57 (L) 10/01/2019    Lab Results  Component Value Date   WBC 2.4 (L) 04/05/2022   NEUTROABS 1.6 (L) 04/05/2022   HGB 9.3 (L) 04/05/2022   HCT 28.8 (L) 04/05/2022   MCV 108.7 (H) 04/05/2022   PLT 175 04/05/2022     STUDIES: NM PET Image Restag (PS) Skull Base To Thigh  Result Date: 04/02/2022 CLINICAL DATA:  Subsequent treatment strategy for metastatic breast cancer. EXAM: NUCLEAR MEDICINE PET SKULL BASE TO THIGH TECHNIQUE: 11.4 mCi F-18 FDG was injected intravenously. Full-ring PET imaging was performed from the skull base to thigh after the radiotracer. CT data was obtained and used for attenuation correction and anatomic localization. This PET facility now utilizes time-of-flight PET imaging. This new technology can increase SUV measurements by 16- 20 % over conventional PET technology. Fasting blood glucose: 95 mg/dl COMPARISON:  PET-CT 10/02/2021 and 03/02/2021. FINDINGS: Mediastinal blood pool activity: SUV max 2.5 NECK: No hypermetabolic cervical lymph nodes are identified.New asymmetric metabolic activity in the right nasopharynx (SUV max 9.1) without clear corresponding abnormality on the CT images. No other suspicious activity identified within the pharyngeal mucosal space. Incidental CT findings: none CHEST: There are no hypermetabolic mediastinal, hilar or axillary lymph nodes. No hypermetabolic pulmonary activity or suspicious nodularity. Incidental CT findings: Mild hypermetabolic activity in the distal esophagus, likely due to reflux or esophagitis. Mild atherosclerosis of the aorta,  great vessels and coronary arteries. Calcified right lower lobe granuloma and mild pulmonary scarring bilaterally. ABDOMEN/PELVIS: There is no hypermetabolic activity within the liver, adrenal glands, spleen or pancreas. There is no hypermetabolic nodal activity in the abdomen or pelvis. Incidental CT findings: Aortic and branch vessel atherosclerosis. Stable simple appearing cyst in the interpolar region of the left kidney for which no follow-up imaging is recommended. SKELETON: As demonstrated previously, there is multifocal  hypermetabolic osseous metastatic disease. Although technology upgrades on the current examination limit comparison with the prior study, this disease appears progressive. Representative changes include increasing activity within the right mandibular lesion (SUV max 21.0, previously 8.7), increasing activity in a right scapular glenoid lesion (SUV max 12.1), increasing activity within a left scapular lesion (SUV max 8.6, previously 4.0), and significantly increased activity within lesions within the right bony pelvis, including a right iliac lesion with an SUV max of 10.8 and a right ischial lesion with an SUV max of 10.6. The left iliac lesion appears about the same with an SUV max of 6.0 (previously 5.8). Incidental CT findings: Left mastectomy without evidence of chest wall recurrence. No epidural tumor or pathologic fracture identified in the spine. IMPRESSION: 1. Interval progression of multifocal hypermetabolic osseous metastatic disease as described. 2. No evidence of extra osseous metastatic disease. 3. New asymmetric metabolic activity in the right nasopharynx without clear corresponding abnormality on CT images. This could be inflammatory or related to recent dental surgery, although a mucosal lesion cannot be excluded. Consider direct visualization. 4.  Aortic Atherosclerosis (ICD10-I70.0). Electronically Signed   By: Richardean Sale M.D.   On: 04/02/2022 09:42     ONCOLOGY  HISTORY: Patient initially received neoadjuvant chemotherapy with Adriamycin and Cytoxan followed by weekly Taxol. She only received 4 cycles of weekly Taxol prior to discontinuation of treatment on October 31, 2017 secondary to persistent peripheral neuropathy.  She ultimately required mastectomy and final pathology noted 5 of 6 lymph nodes positive for disease.  Patient completed adjuvant XRT in mid April 2020.  Pain initiated letrozole in May 2020, this was discontinued secondary to side effects and patient was started on anastrozole.  Nuclear medicine bone scan on June 19, 2019 revealed metastatic disease and anastrozole was subsequently discontinued.  PET scan results from October 02, 2021 revealed no clear evidence of metastatic progression.  PET scan results from June 30, 2019 reviewed independently with metastatic bony disease, but no obvious evidence of visceral disease.  MRI of the brain on August 24, 2019 reviewed independently with no obvious evidence of metastatic disease.   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  Patient CA 27-29 has been slowly increasing and her most recent result is 123.8.  PET scan results from March 29, 2022 reviewed independently with what appears to be progression of disease in her bones.  She continues to have no visceral disease.  After lengthy discussion, it was agreed upon that patient will switch treatments to Sen[place] 10 mg daily along with Aromasin 25 mg daily.  Ibrance and Faslodex have been discontinued.  Continue to hold Xgeva at this time given her issues with her mandible.  Return to clinic in 4 weeks for repeat laboratory work and further evaluation.  Appreciate clinical pharmacy input. Peripheral neuropathy: Chronic and unchanged. History of pulmonary embolus: Patient  was diagnosed with a small pulmonary embolus on January 10, 2018.  She is no longer on anticoagulation.  There was no evidence of recurrent PE on CT scan from October 27, 2019. Osteopenia: Patient's most recent bone mineral density on March 03, 2019 reported a T score of -1.8 which is only mildly decreased from 1 year prior when the reported T score was -1.6.  Continue calcium and vitamin D supplementation.  Continue to hold Xgeva as above. Left hip/flank  pain: Chronic and unchanged.  Patient was evaluated by radiation oncology who determined that no further XRT is possible.  MRI results from November 05, 2021 with similar to PET scan as above with no obvious evidence of fracture.  Patient reports she takes pain medication sparingly.  Can consider referral to orthopedics if necessary.  Repeat PET scan as above. Leukopenia: Chronic and unchanged.  Patient's total white blood cell count is 2.4.  Discontinue Ibrance as above.  Total white blood cell count has ranged between 1.6 and 3.5 since July 2021.  Anemia: Patient's hemoglobin has trended down slightly to 9.3.  Monitor. Renal insufficiency: Chronic and unchanged.  Patient's creatinine is 1.54. Hypokalemia: Resolved.  Continue oral supplementation as prescribed.   Dental pathology: Significantly improved.  Continue follow up with Dr. Hampton Abbot.     Patient expressed understanding and was in agreement with this plan. She also understands that She can call clinic at any time with any questions, concerns, or complaints.    Cancer Staging  Breast cancer, stage 2, left ([place]) Staging form: Breast, AJCC 8th Edition - Clinical stage from 07/30/2017: Stage IIA (cT2, cN1, cM0, G2, ER+, PR+, HER2-) - Signed by Lloyd Huger, MD on 07/30/2017 Histologic grading system: 3 grade system Laterality: Left   Lloyd Huger, MD   04/05/2022 11:59 AM

## 2022-04-05 NOTE — Telephone Encounter (Signed)
Oral Oncology Patient Advocate Encounter  New authorization   Received notification that prior authorization for Everolimus is required.   PA submitted on 04/05/22  Key [id number]  Status is pending     Alicia [name], [name]  [phone] (phone) [phone] (fax) 04/05/2022 12:15 PM

## 2022-04-06 ENCOUNTER — Other Ambulatory Visit (HOSPITAL_COMMUNITY): Payer: Self-pay

## 2022-04-06 ENCOUNTER — Other Ambulatory Visit: Payer: Self-pay

## 2022-04-06 LAB — CANCER ANTIGEN 27.29: CA 27.29: 154.7 U/mL — ABNORMAL HIGH (ref 0.0–38.6)

## 2022-04-06 NOTE — Telephone Encounter (Signed)
Patient successfully OnBoarded. Afinitor and Aromasin set for shipment on 03/25 and delivery on 03/26 from [facility].    Berdine Addison, [facility] [place]  [phone] (phone) [phone] (fax) 04/06/2022 10:48 AM

## 2022-04-09 ENCOUNTER — Other Ambulatory Visit: Payer: Self-pay

## 2022-04-23 ENCOUNTER — Telehealth: Payer: Self-pay | Admitting: *Deleted

## 2022-04-23 NOTE — Telephone Encounter (Signed)
Call from [facility] stating that they need to Primary ICD 10 code and not the secondary code that was provided to them (C79.51) as it is not a valid code for this test. She requests a return call

## 2022-04-26 ENCOUNTER — Other Ambulatory Visit (HOSPITAL_COMMUNITY): Payer: Self-pay

## 2022-04-26 ENCOUNTER — Encounter: Payer: Self-pay | Admitting: Oncology

## 2022-04-30 ENCOUNTER — Other Ambulatory Visit: Payer: Self-pay

## 2022-05-01 ENCOUNTER — Other Ambulatory Visit: Payer: Self-pay

## 2022-05-02 ENCOUNTER — Encounter: Payer: Self-pay | Admitting: Oncology

## 2022-05-02 DIAGNOSIS — C7951 Secondary malignant neoplasm of bone: Secondary | ICD-10-CM | POA: Diagnosis not present

## 2022-05-02 DIAGNOSIS — C50919 Malignant neoplasm of unspecified site of unspecified female breast: Secondary | ICD-10-CM | POA: Diagnosis not present

## 2022-05-03 ENCOUNTER — Encounter: Payer: Self-pay | Admitting: Oncology

## 2022-05-03 ENCOUNTER — Telehealth: Payer: Self-pay | Admitting: *Deleted

## 2022-05-03 ENCOUNTER — Ambulatory Visit: Payer: Medicare Other | Admitting: Pharmacist

## 2022-05-03 ENCOUNTER — Inpatient Hospital Stay (HOSPITAL_BASED_OUTPATIENT_CLINIC_OR_DEPARTMENT_OTHER): Payer: Medicare Other | Admitting: Oncology

## 2022-05-03 ENCOUNTER — Inpatient Hospital Stay: Payer: Medicare Other | Attending: Oncology

## 2022-05-03 ENCOUNTER — Other Ambulatory Visit: Payer: Medicare Other

## 2022-05-03 ENCOUNTER — Ambulatory Visit: Payer: Medicare Other | Admitting: Oncology

## 2022-05-03 ENCOUNTER — Inpatient Hospital Stay: Payer: Medicare Other | Admitting: Pharmacist

## 2022-05-03 VITALS — BP 139/59 | HR 88 | Temp 97.1°F | Resp 18 | Ht 66.0 in | Wt 208.0 lb

## 2022-05-03 DIAGNOSIS — Z170 Estrogen receptor positive status [ER+]: Secondary | ICD-10-CM | POA: Diagnosis not present

## 2022-05-03 DIAGNOSIS — E876 Hypokalemia: Secondary | ICD-10-CM | POA: Diagnosis not present

## 2022-05-03 DIAGNOSIS — Z9012 Acquired absence of left breast and nipple: Secondary | ICD-10-CM | POA: Diagnosis not present

## 2022-05-03 DIAGNOSIS — C50912 Malignant neoplasm of unspecified site of left female breast: Secondary | ICD-10-CM | POA: Diagnosis not present

## 2022-05-03 DIAGNOSIS — N189 Chronic kidney disease, unspecified: Secondary | ICD-10-CM | POA: Insufficient documentation

## 2022-05-03 DIAGNOSIS — Z79811 Long term (current) use of aromatase inhibitors: Secondary | ICD-10-CM | POA: Diagnosis not present

## 2022-05-03 DIAGNOSIS — C50919 Malignant neoplasm of unspecified site of unspecified female breast: Secondary | ICD-10-CM

## 2022-05-03 DIAGNOSIS — M25552 Pain in left hip: Secondary | ICD-10-CM | POA: Diagnosis not present

## 2022-05-03 DIAGNOSIS — D649 Anemia, unspecified: Secondary | ICD-10-CM | POA: Insufficient documentation

## 2022-05-03 DIAGNOSIS — C7951 Secondary malignant neoplasm of bone: Secondary | ICD-10-CM

## 2022-05-03 DIAGNOSIS — M8580 Other specified disorders of bone density and structure, unspecified site: Secondary | ICD-10-CM | POA: Diagnosis not present

## 2022-05-03 DIAGNOSIS — R109 Unspecified abdominal pain: Secondary | ICD-10-CM | POA: Diagnosis not present

## 2022-05-03 DIAGNOSIS — R7401 Elevation of levels of liver transaminase levels: Secondary | ICD-10-CM | POA: Diagnosis not present

## 2022-05-03 DIAGNOSIS — Z87891 Personal history of nicotine dependence: Secondary | ICD-10-CM | POA: Insufficient documentation

## 2022-05-03 DIAGNOSIS — Z86711 Personal history of pulmonary embolism: Secondary | ICD-10-CM | POA: Diagnosis not present

## 2022-05-03 DIAGNOSIS — G620 Drug-induced polyneuropathy: Secondary | ICD-10-CM | POA: Insufficient documentation

## 2022-05-03 LAB — CBC WITH DIFFERENTIAL/PLATELET
Abs Immature Granulocytes: 0.07 10*3/uL (ref 0.00–0.07)
Basophils Absolute: 0 10*3/uL (ref 0.0–0.1)
Basophils Relative: 0 %
Eosinophils Absolute: 0.1 10*3/uL (ref 0.0–0.5)
Eosinophils Relative: 1 %
HCT: 29.2 % — ABNORMAL LOW (ref 36.0–46.0)
Hemoglobin: 9.8 g/dL — ABNORMAL LOW (ref 12.0–15.0)
Immature Granulocytes: 1 %
Lymphocytes Relative: 8 %
Lymphs Abs: 0.4 10*3/uL — ABNORMAL LOW (ref 0.7–4.0)
MCH: 34 pg (ref 26.0–34.0)
MCHC: 33.6 g/dL (ref 30.0–36.0)
MCV: 101.4 fL — ABNORMAL HIGH (ref 80.0–100.0)
Monocytes Absolute: 0.8 10*3/uL (ref 0.1–1.0)
Monocytes Relative: 15 %
Neutro Abs: 3.8 10*3/uL (ref 1.7–7.7)
Neutrophils Relative %: 75 %
Platelets: 176 10*3/uL (ref 150–400)
RBC: 2.88 MIL/uL — ABNORMAL LOW (ref 3.87–5.11)
RDW: 13.8 % (ref 11.5–15.5)
WBC: 5.1 10*3/uL (ref 4.0–10.5)
nRBC: 0 % (ref 0.0–0.2)

## 2022-05-03 LAB — COMPREHENSIVE METABOLIC PANEL
ALT: 53 U/L — ABNORMAL HIGH (ref 0–44)
AST: 75 U/L — ABNORMAL HIGH (ref 15–41)
Albumin: 3.8 g/dL (ref 3.5–5.0)
Alkaline Phosphatase: 109 U/L (ref 38–126)
Anion gap: 12 (ref 5–15)
BUN: 22 mg/dL (ref 8–23)
CO2: 27 mmol/L (ref 22–32)
Calcium: 9.1 mg/dL (ref 8.9–10.3)
Chloride: 97 mmol/L — ABNORMAL LOW (ref 98–111)
Creatinine, Ser: 1.69 mg/dL — ABNORMAL HIGH (ref 0.44–1.00)
GFR, Estimated: 32 mL/min — ABNORMAL LOW (ref >60)
Glucose, Bld: 149 mg/dL — ABNORMAL HIGH (ref 70–99)
Potassium: 2.7 mmol/L — CL (ref 3.5–5.1)
Sodium: 136 mmol/L (ref 135–145)
Total Bilirubin: 0.7 mg/dL (ref 0.3–1.2)
Total Protein: 7.4 g/dL (ref 6.5–8.1)

## 2022-05-03 NOTE — Telephone Encounter (Signed)
RN received message to call lab for critical results. Patient with critical potassium of 2.7. Lab repeated and verified with read back process. Critical lab reported to MD at 2:57 pm.

## 2022-05-03 NOTE — Progress Notes (Signed)
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  INTERVAL HISTORY: Patient returns to clinic today for further evaluation and to assess her toleration of Afinitor.  She continues to have significant left hip pain.  Her jaw pain has significantly improved now that she has had a tooth extraction.  She continues to have left hip pain.  She does not complain of weakness or fatigue.  She continues to have a mild peripheral neuropathy, but no other neurologic complaints.  She denies any chest pain, shortness of breath, cough, or hemoptysis.  She denies any nausea, vomiting, constipation, or diarrhea.  She has no urinary complaints.  Patient offers no further specific complaints today.  REVIEW OF SYSTEMS:   Review of Systems  Constitutional: Negative.  Negative for fever, malaise/fatigue and weight loss.  HENT:  Negative for congestion.   Respiratory: Negative.  Negative for cough and shortness of breath.   Cardiovascular: Negative.  Negative for chest pain and leg swelling.  Gastrointestinal: Negative.  Negative for abdominal pain, constipation, diarrhea and nausea.  Genitourinary: Negative.  Negative for dysuria, flank pain and urgency.  Musculoskeletal:  Positive for joint pain. Negative for back pain.  Skin: Negative.  Negative for rash.  Neurological:  Positive for tingling and sensory change. Negative for dizziness, focal weakness, weakness and  headaches.  Psychiatric/Behavioral:  The patient is not nervous/anxious.     As per HPI. Otherwise, a complete review of systems is negative.  PAST MEDICAL HISTORY: Past Medical History:  Diagnosis Date   Anxiety    Breast cancer, left 07/2017   Depression    Hypertension    Hypothyroidism    Personal history of chemotherapy 2019   LEFT mastectomy-chemo before   Personal history of radiation therapy 03/2018   LEFT mastectomy   Rapid heart rate    Thyroid disease     PAST SURGICAL HISTORY: Past Surgical History:  Procedure Laterality Date   AXILLARY LYMPH NODE BIOPSY Left 07/16/2017   METASTATIC MAMMARY CARCINOMA   BREAST BIOPSY Left 07/16/2017   [place] bx of left breast mass and left breast LN.  INVASIVE MAMMARY CARCINOMA, NO SPECIAL TYPE.    BREAST EXCISIONAL BIOPSY Right 2001   benign   BREAST LUMPECTOMY WITH SENTINEL LYMPH NODE BIOPSY Left 12/06/2017   Procedure: BREAST LUMPECTOMY WITH SENTINEL LYMPH NODE BX;  Surgeon: Earline Mayotte, MD;  Location: [facility] ORS;  Service: General;  Laterality: Left;   COLONOSCOPY     MASTECTOMY Left 12/23/2017   PORTACATH PLACEMENT Right 08/07/2017   Procedure: INSERTION PORT-A-CATH;  Surgeon: Earline Mayotte, MD;  Location: [facility] OR[facility];  Service: General;  Laterality: Right;   SIMPLE MASTECTOMY WITH AXILLARY SENTINEL NODE BIOPSY Left 12/23/2017   T2,N2 with 6/7 nodes positive. Whole breast radiation.  Surgeon: Earline Mayotte, MD;  Location: [facility] ORS;  Service: General;  Laterality: Left;   TONSILLECTOMY      FAMILY HISTORY: Family History  Problem Relation Age of Onset   Stroke Mother    Thyroid disease Mother    Renal Disease  Mother    Stroke Father    Heart attack Father    Sudden death Father 59       suicide   Anuerysm Brother    Breast cancer Neg Hx     ADVANCED DIRECTIVES (Y/N):  N  HEALTH MAINTENANCE: Social History   Tobacco Use   Smoking status: Former    Packs/day: 1.00    Years: 30.00    Additional pack  years: 0.00    Total pack years: 30.00    Types: Cigarettes    Quit date: 2019    Years since quitting: 5.2   Smokeless tobacco: Never  Vaping Use   Vaping Use: Never used  Substance Use Topics   Alcohol use: Not Currently   Drug use: Never     Colonoscopy:  PAP:  Bone density:  Lipid panel:  Allergies  Allergen Reactions   Sulfa Antibiotics Diarrhea    Current Outpatient Medications  Medication Sig Dispense Refill   acetaminophen (TYLENOL) 500 MG tablet Take 1,000 mg by mouth every 6 (six) hours as needed for moderate pain or headache.      ALPRAZolam (XANAX) 0.5 MG tablet Take 1 tablet (0.5 mg total) by mouth 2 (two) times daily. 60 tablet 0   amLODipine (NORVASC) 5 MG tablet Take 1 tablet (5 mg total) by mouth daily. 90 tablet 3   aspirin EC 81 MG tablet Take 81 mg by mouth at bedtime.      calcium carbonate (OS-CAL - DOSED IN MG OF ELEMENTAL CALCIUM) 1250 (500 Ca) MG tablet Take 1 tablet by mouth daily with breakfast.     chlorhexidine (PERIDEX) 0.12 % solution SMARTSIG:By Mouth     Cholecalciferol (VITAMIN D3) 125 MCG (5000 UT) CAPS Take 1 capsule by mouth 2 (two) times a day.     dexamethasone (DECADRON) 0.5 MG/5ML solution Swish 10 mLs (1 mg total) by mouth for 2 mins, then spit. Swish 4 (four) times daily. 500 mL 5   docusate sodium (COLACE) 100 MG capsule Take 100 mg by mouth 3 (three) times daily as needed for mild constipation.     everolimus (AFINITOR) 10 MG tablet Take 1 tablet (10 mg total) by mouth daily. 28 tablet 1   exemestane (AROMASIN) 25 MG tablet Take 1 tablet (25 mg total) by mouth daily after breakfast. 30 tablet 2   hydrochlorothiazide (HYDRODIURIL) 12.5 MG tablet TAKE 1 TABLET(12.5 MG) BY MOUTH DAILY 90 tablet 3   levothyroxine (SYNTHROID) 100 MCG tablet TAKE 1 TABLET BY MOUTH DAILY 90 tablet 3   olopatadine (PATANOL) 0.1 % ophthalmic solution Place 1 drop into the right eye 2 (two) times daily. 5 mL 12   ondansetron (ZOFRAN) 8 MG tablet Take 1 tablet  (8 mg total) by mouth every 8 (eight) hours as needed for nausea or vomiting. 20 tablet 2   pantoprazole (PROTONIX) 20 MG tablet TAKE 1 TABLET(20 MG) BY MOUTH DAILY 90 tablet 0   potassium chloride SA (KLOR-CON M) 20 MEQ tablet TAKE 1 TABLET BY MOUTH TWICE DAILY 60 tablet 3   PREVIDENT 5000 PLUS 1.1 % CREA dental cream SMARTSIG:Application By Mouth     Probiotic Product (ALIGN PO) Take by mouth.     rosuvastatin (CRESTOR) 5 MG tablet Take 1 tablet (5 mg total) by mouth daily. 90 tablet 3   traZODone (DESYREL) 50 MG tablet TAKE 1 TABLET BY MOUTH DAILY 30 tablet 6   triamcinolone cream (KENALOG) 0.1 % Apply 1 application. topically 2 (two) times daily. 30  g 0   nystatin-lidocaine-prednisoLONE-diphenhydrAMINE-distilled water-alum & mag hydroxide-simeth Swish and spit 5-15ml four times a day as needed (Patient not taking: Reported on 02/08/2022) 480 mL 3   traMADol (ULTRAM) 50 MG tablet Take 1 tablet (50 mg total) by mouth every 6 (six) hours as needed. (Patient not taking: Reported on 05/03/2022) 60 tablet 0   No current facility-administered medications for this visit.    OBJECTIVE: Vitals:   05/03/22 1425  BP: (!) 139/59  Pulse: 88  Resp: 18  Temp: (!) 97.1 F (36.2 C)  SpO2: 100%     Body mass index is 33.57 kg/m.    ECOG FS:1 - Symptomatic but completely ambulatory  General: Well-developed, well-nourished, no acute distress. Eyes: Pink conjunctiva, anicteric sclera. HEENT: Normocephalic, moist mucous membranes. Lungs: No audible wheezing or coughing. Heart: Regular rate and rhythm. Abdomen: Soft, nontender, no obvious distention. Musculoskeletal: No edema, cyanosis, or clubbing. Neuro: Alert, answering all questions appropriately. Cranial nerves grossly intact. Skin: No rashes or petechiae noted. Psych: Normal affect.  LAB RESULTS:  Lab Results  Component Value Date   NA 136 05/03/2022   K 2.7 (LL) 05/03/2022   CL 97 (L) 05/03/2022   CO2 27 05/03/2022   GLUCOSE 149 (H)  05/03/2022   BUN 22 05/03/2022   CREATININE 1.69 (H) 05/03/2022   CALCIUM 9.1 05/03/2022   PROT 7.4 05/03/2022   ALBUMIN 3.8 05/03/2022   AST 75 (H) 05/03/2022   ALT 53 (H) 05/03/2022   ALKPHOS 109 05/03/2022   BILITOT 0.7 05/03/2022   GFRNONAA 32 (L) 05/03/2022   GFRAA 57 (L) 10/01/2019    Lab Results  Component Value Date   WBC 5.1 05/03/2022   NEUTROABS 3.8 05/03/2022   HGB 9.8 (L) 05/03/2022   HCT 29.2 (L) 05/03/2022   MCV 101.4 (H) 05/03/2022   PLT 176 05/03/2022     STUDIES: No results found.   ONCOLOGY HISTORY: Patient initially received neoadjuvant chemotherapy with Adriamycin and Cytoxan followed by weekly Taxol. She only received 4 cycles of weekly Taxol prior to discontinuation of treatment on October 31, 2017 secondary to persistent peripheral neuropathy.  She ultimately required mastectomy and final pathology noted 5 of 6 lymph nodes positive for disease.  Patient completed adjuvant XRT in mid April 2020.  Pain initiated letrozole in May 2020, this was discontinued secondary to side effects and patient was started on anastrozole.  Nuclear medicine bone scan on June 19, 2019 revealed metastatic disease and anastrozole was subsequently discontinued.  PET scan results from October 02, 2021 revealed no clear evidence of metastatic progression.  PET scan results from June 30, 2019 reviewed independently with metastatic bony disease, but no obvious evidence of visceral disease.  MRI of the brain on August 24, 2019 reviewed independently with no obvious evidence of metastatic disease.   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  Patient CA 27-29 has been slowly increasing and her most recent result is 154.7.  PET scan results from March 29, 2022 reviewed independently with what appears to  be progression of disease in her bones.  She continues to have no visceral disease.  After lengthy discussion, it was agreed upon that patient will switch treatments to Afinitor 10 mg daily along with Aromasin 25 mg daily.  Ibrance and Faslodex have been discontinued.  Continue to hold Xgeva at this time  given her issues with her mandible.  Return to clinic in 4 weeks for repeat laboratory work and further evaluation.  Appreciate clinical pharmacy input. Peripheral neuropathy: Chronic and unchanged. History of pulmonary embolus: Patient was diagnosed with a small pulmonary embolus on January 10, 2018.  She is no longer on anticoagulation.  There was no evidence of recurrent PE on CT scan from October 27, 2019. Osteopenia: Patient's most recent bone mineral density on March 03, 2019 reported a T score of -1.8 which is only mildly decreased from 1 year prior when the reported T score was -1.6.  Continue calcium and vitamin D supplementation.  Continue to hold Xgeva as above. Left hip/flank pain: Worse.  Patient is hesitant to take narcotics therefore was given a prescription for tramadol today.  Have sent a referral back to radiation oncology to assess whether XRT is possible to her hypermetabolic left ischial area.   Leukopenia: Resolved.  Anemia: Chronic and unchanged.  Patient's hemoglobin is 9.8. Renal insufficiency: Chronic and unchanged.  Patient's creatinine is 1.69.Marland Kitchen Hypokalemia: Significantly worse.  Patient has been instructed to take her potassium supplements twice a day and she will also return to clinic tomorrow to receive 40 mEq IV potassium. Dental pathology: Significantly improved.  Continue follow up with Dr. Willaim Bane.   Transaminitis: Mild, monitor.   Patient expressed understanding and was in agreement with this plan. She also understands that She can call clinic at any time with any questions, concerns, or complaints.    Cancer Staging  Breast cancer, stage 2, left Staging form:  Breast, AJCC 8th Edition - Clinical stage from 07/30/2017: Stage IIA (cT2, cN1, cM0, G2, ER+, PR+, HER2-) - Signed by Jeralyn Ruths, MD on 07/30/2017 Histologic grading system: 3 grade system Laterality: Left   Jeralyn Ruths, MD   05/03/2022 3:56 PM

## 2022-05-04 ENCOUNTER — Inpatient Hospital Stay: Payer: Medicare Other

## 2022-05-04 ENCOUNTER — Other Ambulatory Visit: Payer: Self-pay | Admitting: Pharmacist

## 2022-05-04 VITALS — BP 125/71 | HR 76 | Temp 98.3°F | Resp 20

## 2022-05-04 DIAGNOSIS — Z170 Estrogen receptor positive status [ER+]: Secondary | ICD-10-CM | POA: Diagnosis not present

## 2022-05-04 DIAGNOSIS — E876 Hypokalemia: Secondary | ICD-10-CM | POA: Diagnosis not present

## 2022-05-04 DIAGNOSIS — N189 Chronic kidney disease, unspecified: Secondary | ICD-10-CM | POA: Diagnosis not present

## 2022-05-04 DIAGNOSIS — C7951 Secondary malignant neoplasm of bone: Secondary | ICD-10-CM | POA: Diagnosis not present

## 2022-05-04 DIAGNOSIS — Z9012 Acquired absence of left breast and nipple: Secondary | ICD-10-CM | POA: Diagnosis not present

## 2022-05-04 DIAGNOSIS — C50912 Malignant neoplasm of unspecified site of left female breast: Secondary | ICD-10-CM | POA: Diagnosis not present

## 2022-05-04 LAB — CANCER ANTIGEN 27.29: CA 27.29: 155.9 U/mL — ABNORMAL HIGH (ref 0.0–38.6)

## 2022-05-04 MED ORDER — POTASSIUM CHLORIDE 20 MEQ/100ML IV SOLN: 20.0000 meq | Freq: Once | INTRAVENOUS | Status: DC

## 2022-05-04 MED ORDER — TRAMADOL HCL 50 MG PO TABS: 50.0000 mg | ORAL_TABLET | Freq: Four times a day (QID) | ORAL | 0 refills | Status: DC | PRN

## 2022-05-04 MED ORDER — SODIUM CHLORIDE 0.9 % IV SOLN
40.0000 meq | Freq: Once | INTRAVENOUS | Status: AC
  Administered 2022-05-04: 40 meq via INTRAVENOUS
  Filled 2022-05-04: qty 20

## 2022-05-04 NOTE — Patient Instructions (Signed)
Hypokalemia Hypokalemia means that the amount of potassium in the blood is lower than normal. Potassium is a mineral (electrolyte) that helps regulate the amount of fluid in the body. It also stimulates muscle tightening (contraction) and helps nerves work properly. Normally, most of the body's potassium is inside cells, and only a very small amount is in the blood. Because the amount in the blood is so small, minor changes to potassium levels in the blood can be life-threatening. What are the causes? This condition may be caused by: Antibiotic medicine. Diarrhea or vomiting. Taking too much of a medicine that helps you have a bowel movement (laxative) can cause diarrhea and lead to hypokalemia. Chronic kidney disease (CKD). Medicines that help the body get rid of excess fluid (diuretics). Eating disorders, such as anorexia or bulimia. Low magnesium levels in the body. Sweating a lot. What are the signs or symptoms? Symptoms of this condition include: Weakness. Constipation. Fatigue. Muscle cramps. Mental confusion. Skipped heartbeats or irregular heartbeat (palpitations). Tingling or numbness. How is this diagnosed? This condition is diagnosed with a blood test. How is this treated? This condition may be treated by: Taking potassium supplements. Adjusting the medicines that you take. Eating more foods that contain a lot of potassium. If your potassium level is very low, you may need to get potassium through an IV and be monitored in the hospital. Follow these instructions at home: Eating and drinking  Eat a healthy diet. A healthy diet includes fresh fruits and vegetables, whole grains, healthy fats, and lean proteins. If told, eat more foods that contain a lot of potassium. These include: Nuts, such as peanuts and pistachios. Seeds, such as sunflower seeds and pumpkin seeds. Peas, lentils, and lima beans. Whole grain and bran cereals and breads. Fresh fruits and vegetables,  such as apricots, avocado, bananas, cantaloupe, kiwi, oranges, tomatoes, asparagus, and potatoes. Juices, such as orange, tomato, and prune. Lean meats, including fish. Milk and milk products, such as yogurt. General instructions Take over-the-counter and prescription medicines only as told by your health care provider. This includes vitamins, natural food products, and supplements. Keep all follow-up visits. This is important. Contact a health care provider if: You have weakness that gets worse. You feel your heart pounding or racing. You vomit. You have diarrhea. You have diabetes and you have trouble keeping your blood sugar in your target range. Get help right away if: You have chest pain. You have shortness of breath. You have vomiting or diarrhea that lasts for more than 2 days. You faint. These symptoms may be an emergency. Get help right away. Call 911. Do not wait to see if the symptoms will go away. Do not drive yourself to the hospital. Summary Hypokalemia means that the amount of potassium in the blood is lower than normal. This condition is diagnosed with a blood test. Hypokalemia may be treated by taking potassium supplements, adjusting the medicines that you take, or eating more foods that are high in potassium. If your potassium level is very low, you may need to get potassium through an IV and be monitored in the hospital. This information is not intended to replace advice given to you by your health care provider. Make sure you discuss any questions you have with your health care provider. Document Revised: 09/15/2020 Document Reviewed: 09/15/2020 Elsevier Patient Education  2023 [employer]  

## 2022-05-04 NOTE — Progress Notes (Signed)
Oral Chemotherapy Clinic [facility]  [phone] [phone]  [name], [name], [name], [name] Jim Like, RN as Registered Nurse Orlie Dakin Tollie Pizza, MD as Consulting Physician (Oncology) Carmina Miller, MD as Referring Physician (Radiation Oncology) Chriss Driver, RN as Registered Nurse   Name of the patient: Alicia [name]  [id number]  March 18, 1950   Date of visit: 05/04/22  HPI: Patient is a 72 y.o. female with metastatic ER+ HER2- breast cancer. Previously treated with palbociclib, but treatment was changed to Afinitor (everolimus) and exemestane due to disease progression 03/2022.   Reason for Consult: Oral chemotherapy follow-up for everolimus and exemestane therapy.   PAST MEDICAL HISTORY: Past Medical History:  Diagnosis Date   Anxiety    Breast cancer, left 07/2017   Depression    Hypertension    Hypothyroidism    Personal history of chemotherapy 2019   LEFT mastectomy-chemo before   Personal history of radiation therapy 03/2018   LEFT mastectomy   Rapid heart rate    Thyroid disease     HEMATOLOGY/ONCOLOGY HISTORY:  Oncology History Overview Note  Patient is a 72 year old female who recently self palpated a mass on her left breast.  Subsequent imaging and biopsy revealed the above-stated breast cancer.  Case was also discussed extensively at case conference.  Given the size and the stage of patient's malignancy, she will benefit from neoadjuvant chemotherapy using Adriamycin, Cytoxan, and Taxol.  Patient will also require Neulasta support.  Will get CT scan of the chest, abdomen, and pelvis to assess for any metastatic disease.  Patient will also require port placement and MUGA prior to initiating treatment  CT abdomen/pelvis/chest did not reveal any suspicious lesions concerning for metastatic disease.  (08/01/17)  Port-A-Cath placed on 08/07/2017.  Cycle 1 day 1 of AC was given on 08/08/17.     Breast cancer, stage 2, left  07/24/2017 Initial Diagnosis   Breast cancer, stage 2, left (HCC)   07/30/2017 Cancer Staging   Staging form: Breast, AJCC 8th Edition - Clinical stage from 07/30/2017: Stage IIA (cT2, cN1, cM0, G2, ER+, PR+, HER2-) - Signed by Jeralyn Ruths, MD on 07/30/2017   08/08/2017 - 10/31/2017 Chemotherapy   The patient had DOXOrubicin (ADRIAMYCIN) chemo injection 130 mg, 60 mg/m2 = 130 mg, Intravenous,  Once, 4 of 4 cycles Administration: 130 mg (08/08/2017), 130 mg (08/22/2017), 130 mg (09/05/2017), 130 mg (09/19/2017) palonosetron (ALOXI) injection 0.25 mg, 0.25 mg, Intravenous,  Once, 4 of 4 cycles Administration: 0.25 mg (08/08/2017), 0.25 mg (08/22/2017), 0.25 mg (09/05/2017), 0.25 mg (09/19/2017) pegfilgrastim-cbqv (UDENYCA) injection 6 mg, 6 mg, Subcutaneous, Once, 4 of 4 cycles Administration: 6 mg (08/09/2017), 6 mg (08/23/2017), 6 mg (09/06/2017), 6 mg (09/20/2017) cyclophosphamide (CYTOXAN) 1,300 mg in sodium chloride 0.9 % 250 mL chemo infusion, 600 mg/m2 = 1,300 mg, Intravenous,  Once, 4 of 4 cycles Administration: 1,300 mg (08/08/2017), 1,300 mg (08/22/2017), 1,300 mg (09/05/2017), 1,300 mg (09/19/2017) PACLitaxel (TAXOL) 174 mg in sodium chloride 0.9 % 250 mL chemo infusion (</= 80mg /m2), 80 mg/m2 = 174 mg, Intravenous,  Once, 4 of 12 cycles Dose modification: 72 mg/m2 (original dose 80 mg/m2, Cycle 6, Reason: Dose not tolerated) Administration: 174 mg (10/03/2017), 156 mg (10/17/2017), 156 mg (10/24/2017), 156 mg (10/31/2017) fosaprepitant (EMEND) 150 mg, dexamethasone (DECADRON) 12 mg in sodium chloride 0.9 % 145 mL IVPB, , Intravenous,  Once, 4 of 4 cycles Administration:  (08/08/2017),  (08/22/2017),  (  09/05/2017),  (09/19/2017)  for chemotherapy treatment.      ALLERGIES:  is allergic to sulfa antibiotics.  MEDICATIONS:  Current Outpatient Medications  Medication Sig Dispense  Refill   acetaminophen (TYLENOL) 500 MG tablet Take 1,000 mg by mouth every 6 (six) hours as needed for moderate pain or headache.      ALPRAZolam (XANAX) 0.5 MG tablet Take 1 tablet (0.5 mg total) by mouth 2 (two) times daily. 60 tablet 0   amLODipine (NORVASC) 5 MG tablet Take 1 tablet (5 mg total) by mouth daily. 90 tablet 3   aspirin EC 81 MG tablet Take 81 mg by mouth at bedtime.      calcium carbonate (OS-CAL - DOSED IN MG OF ELEMENTAL CALCIUM) 1250 (500 Ca) MG tablet Take 1 tablet by mouth daily with breakfast.     chlorhexidine (PERIDEX) 0.12 % solution SMARTSIG:By Mouth     Cholecalciferol (VITAMIN D3) 125 MCG (5000 UT) CAPS Take 1 capsule by mouth 2 (two) times a day.     dexamethasone (DECADRON) 0.5 MG/5ML solution Swish 10 mLs (1 mg total) by mouth for 2 mins, then spit. Swish 4 (four) times daily. 500 mL 5   docusate sodium (COLACE) 100 MG capsule Take 100 mg by mouth 3 (three) times daily as needed for mild constipation.     everolimus (AFINITOR) 10 MG tablet Take 1 tablet (10 mg total) by mouth daily. 28 tablet 1   exemestane (AROMASIN) 25 MG tablet Take 1 tablet (25 mg total) by mouth daily after breakfast. 30 tablet 2   hydrochlorothiazide (HYDRODIURIL) 12.5 MG tablet TAKE 1 TABLET(12.5 MG) BY MOUTH DAILY 90 tablet 3   levothyroxine (SYNTHROID) 100 MCG tablet TAKE 1 TABLET BY MOUTH DAILY 90 tablet 3   nystatin-lidocaine-prednisoLONE-diphenhydrAMINE-distilled water-alum & mag hydroxide-simeth Swish and spit 5-42ml four times a day as needed (Patient not taking: Reported on 02/08/2022) 480 mL 3   olopatadine (PATANOL) 0.1 % ophthalmic solution Place 1 drop into the right eye 2 (two) times daily. 5 mL 12   ondansetron (ZOFRAN) 8 MG tablet Take 1 tablet (8 mg total) by mouth every 8 (eight) hours as needed for nausea or vomiting. 20 tablet 2   pantoprazole (PROTONIX) 20 MG tablet TAKE 1 TABLET(20 MG) BY MOUTH DAILY 90 tablet 0   potassium chloride SA (KLOR-CON M) 20 MEQ tablet TAKE 1  TABLET BY MOUTH TWICE DAILY 60 tablet 3   PREVIDENT 5000 PLUS 1.1 % CREA dental cream SMARTSIG:Application By Mouth     Probiotic Product (ALIGN PO) Take by mouth.     rosuvastatin (CRESTOR) 5 MG tablet Take 1 tablet (5 mg total) by mouth daily. 90 tablet 3   traMADol (ULTRAM) 50 MG tablet Take 1 tablet (50 mg total) by mouth every 6 (six) hours as needed. 60 tablet 0   traZODone (DESYREL) 50 MG tablet TAKE 1 TABLET BY MOUTH DAILY 30 tablet 6   triamcinolone cream (KENALOG) 0.1 % Apply 1 application. topically 2 (two) times daily. 30 g 0   No current facility-administered medications for this visit.    VITAL SIGNS: There were no vitals taken for this visit. There were no vitals filed for this visit.  Estimated body mass index is 33.57 kg/m as calculated from the following:   Height as of an earlier encounter on 05/03/22:  (1.676 m).   Weight as of an earlier encounter on 05/03/22: 94.3 kg (208 lb).  LABS: CBC:    Component Value Date/Time   WBC  5.1 05/03/2022 1415   HGB 9.8 (L) 05/03/2022 1415   HGB 14.2 02/11/2011 1349   HCT 29.2 (L) 05/03/2022 1415   HCT 40.9 02/11/2011 1349   PLT 176 05/03/2022 1415   PLT 151 02/11/2011 1349   MCV 101.4 (H) 05/03/2022 1415   MCV 89 02/11/2011 1349   NEUTROABS 3.8 05/03/2022 1415   LYMPHSABS 0.4 (L) 05/03/2022 1415   MONOABS 0.8 05/03/2022 1415   EOSABS 0.1 05/03/2022 1415   BASOSABS 0.0 05/03/2022 1415   Comprehensive Metabolic Panel:    Component Value Date/Time   NA 136 05/03/2022 1415   NA 142 02/11/2011 1349   K 2.7 (LL) 05/03/2022 1415   K 3.8 02/11/2011 1349   CL 97 (L) 05/03/2022 1415   CL 107 02/11/2011 1349   CO2 27 05/03/2022 1415   CO2 24 02/11/2011 1349   BUN 22 05/03/2022 1415   BUN 14 02/11/2011 1349   CREATININE 1.69 (H) 05/03/2022 1415   CREATININE 0.65 02/11/2011 1349   GLUCOSE 149 (H) 05/03/2022 1415   GLUCOSE 98 02/11/2011 1349   CALCIUM 9.1 05/03/2022 1415   CALCIUM 9.0 02/11/2011 1349   AST 75 (H)  05/03/2022 1415   AST 27 02/11/2011 1349   ALT 53 (H) 05/03/2022 1415   ALT 36 02/11/2011 1349   ALKPHOS 109 05/03/2022 1415   ALKPHOS 122 02/11/2011 1349   BILITOT 0.7 05/03/2022 1415   BILITOT 0.5 02/11/2011 1349   PROT 7.4 05/03/2022 1415   PROT 7.0 02/11/2011 1349   ALBUMIN 3.8 05/03/2022 1415   ALBUMIN 4.1 02/11/2011 1349     Present during today's visit: Patient and her daughter in law  Assessment and Plan: CMP/CBC reviewed Potassium low, patient to RTC tomorrow for replacement Continue everolimus  daily Left hip pain, addressed by MD   Oral Chemotherapy Side Effect/Intolerance:  Mouth sores: patient is using dexamethasone mouth wash. She has one sore on the inside of her bottom lip. She things this is decreasing in size  Oral Chemotherapy Adherence: no missed doses reported No patient barriers to medication adherence identified.   New medications: none reported  Medication Access Issues: no issues, pt fills at [facility] (Special[facility])  Patient expressed understanding and was in agreement with this plan. She also understands that She can call clinic at any time with any questions, concerns, or complaints.   Follow-up plan: RTC in 4 weeks for re-evaluation   Thank you for allowing me to participate in the care of this very pleasant patient.   Time Total: 15 mins  Visit consisted of counseling and education on dealing with issues of symptom management in the setting of serious and potentially life-threatening illness.Greater than 50%  of this time was spent counseling and coordinating care related to the above assessment and plan.  Signed by: Remi Haggard, PharmD, BCPS, BCOP, CPP Hematology/Oncology Clinical Pharmacist Practitioner [facility] Oral Chemotherapy Navigation [facility] [phone]

## 2022-05-08 ENCOUNTER — Encounter: Payer: Self-pay | Admitting: Radiation Oncology

## 2022-05-08 ENCOUNTER — Ambulatory Visit
Admission: RE | Admit: 2022-05-08 | Discharge: 2022-05-08 | Disposition: A | Discharge status: Home / Self Care | Payer: Medicare Other | Source: Ambulatory Visit | Attending: Radiation Oncology | Admitting: Radiation Oncology

## 2022-05-08 VITALS — BP 154/86 | HR 90 | Temp 97.5°F | Resp 16 | Wt 206.0 lb

## 2022-05-08 DIAGNOSIS — C50912 Malignant neoplasm of unspecified site of left female breast: Secondary | ICD-10-CM | POA: Insufficient documentation

## 2022-05-08 DIAGNOSIS — C50412 Malignant neoplasm of upper-outer quadrant of left female breast: Secondary | ICD-10-CM | POA: Diagnosis not present

## 2022-05-08 DIAGNOSIS — C7931 Secondary malignant neoplasm of brain: Secondary | ICD-10-CM | POA: Insufficient documentation

## 2022-05-08 DIAGNOSIS — C50919 Malignant neoplasm of unspecified site of unspecified female breast: Secondary | ICD-10-CM

## 2022-05-08 DIAGNOSIS — Z170 Estrogen receptor positive status [ER+]: Secondary | ICD-10-CM | POA: Diagnosis not present

## 2022-05-08 DIAGNOSIS — C7951 Secondary malignant neoplasm of bone: Secondary | ICD-10-CM | POA: Diagnosis not present

## 2022-05-08 NOTE — Progress Notes (Signed)
Radiation Oncology Follow up Note  Name: COLLIE KITTEL   Date:   05/08/2022 MRN:  [id number] DOB: 1950-03-04    This 72 y.o. female presents to the clinic today for reevaluation of progressive metastatic disease in her pelvis and patient with significant left hip pain who has previously received palliative ration therapy to her left iliac wing and patient with known stage IV breast cancer.  REFERRING PROVIDER: Corky Downs, MD Well-known to our department having previously received palliative radiation therapy to her left iliac wing HPI: Patient is a 72 year old female.  Originally treated back in 2020 when she received radiation therapy to her left chest wall and peripheral lymphatics for stage IIb invasive mammary carcinoma the left breast status post neoadjuvant chemotherapy than left modified radical mastectomy.  She also received palliative radiation therapy to her left hip back in 21.  She is now complaining of left hip pain again ambulating with the help of a cane.  Recent PET/CT scan shows progression of multifocal hypermetabolic osseous metastatic disease involving multiple sites of her pelvis.  Patient does complain of some diarrhea patient is currently on tramadol.  COMPLICATIONS OF TREATMENT: none  FOLLOW UP COMPLIANCE: keeps appointments   PHYSICAL EXAM:  BP (!) 154/86 (BP Location: Right Arm, Patient Position: Sitting, Cuff Size: Large)   Pulse 90   Temp (!) 97.5 F (36.4 C) (Tympanic)   Resp 16   Wt 206 lb (93.4 kg)   BMI 33.25 kg/m  Range of motion of the lower extremities does not elicit pain motor and sensory levels are equal and symmetric in the lower extremity.  Well-developed well-nourished patient in NAD. HEENT reveals PERLA, EOMI, discs not visualized.  Oral cavity is clear. No oral mucosal lesions are identified. Neck is clear without evidence of cervical or supraclavicular adenopathy. Lungs are clear to A&P. Cardiac examination is essentially unremarkable with  regular rate and rhythm without murmur rub or thrill. Abdomen is benign with no organomegaly or masses noted. Motor sensory and DTR levels are equal and symmetric in the upper and lower extremities. Cranial nerves II through XII are grossly intact. Proprioception is intact. No peripheral adenopathy or edema is identified. No motor or sensory levels are noted. Crude visual fields are within normal range.  RADIOLOGY RESULTS: PET CT scan reviewed compatible with above-stated findings  PLAN: At this time is to go with palliative radiation therapy to her pelvis.  Will try to incorporate as many areas of metastatic disease by PET/CT criteria as possible.  We will center certainly on the left hip but also has numerous lesions throughout her pelvis.  Risks and benefits of treatment occluding possible worsening of her diarrhea fatigue alteration blood counts skin reaction all were reviewed with the patient.  I have personally set her up for CT simulation later this week.  I would like to take this opportunity to thank you for allowing me to participate in the care of your patient.Carmina Miller, MD

## 2022-05-10 ENCOUNTER — Ambulatory Visit
Admission: RE | Admit: 2022-05-10 | Discharge: 2022-05-10 | Disposition: A | Discharge status: Home / Self Care | Payer: Medicare Other | Source: Ambulatory Visit | Attending: Radiation Oncology | Admitting: Radiation Oncology

## 2022-05-10 DIAGNOSIS — C7951 Secondary malignant neoplasm of bone: Secondary | ICD-10-CM | POA: Diagnosis not present

## 2022-05-10 DIAGNOSIS — C50912 Malignant neoplasm of unspecified site of left female breast: Secondary | ICD-10-CM | POA: Diagnosis not present

## 2022-05-10 DIAGNOSIS — C50412 Malignant neoplasm of upper-outer quadrant of left female breast: Secondary | ICD-10-CM | POA: Diagnosis not present

## 2022-05-10 DIAGNOSIS — Z170 Estrogen receptor positive status [ER+]: Secondary | ICD-10-CM | POA: Diagnosis not present

## 2022-05-10 DIAGNOSIS — C7931 Secondary malignant neoplasm of brain: Secondary | ICD-10-CM | POA: Diagnosis not present

## 2022-05-14 DIAGNOSIS — C50912 Malignant neoplasm of unspecified site of left female breast: Secondary | ICD-10-CM | POA: Diagnosis not present

## 2022-05-14 DIAGNOSIS — C50412 Malignant neoplasm of upper-outer quadrant of left female breast: Secondary | ICD-10-CM | POA: Diagnosis not present

## 2022-05-14 DIAGNOSIS — C7951 Secondary malignant neoplasm of bone: Secondary | ICD-10-CM | POA: Diagnosis not present

## 2022-05-14 DIAGNOSIS — Z170 Estrogen receptor positive status [ER+]: Secondary | ICD-10-CM | POA: Diagnosis not present

## 2022-05-14 DIAGNOSIS — C7931 Secondary malignant neoplasm of brain: Secondary | ICD-10-CM | POA: Diagnosis not present

## 2022-05-15 ENCOUNTER — Ambulatory Visit: Admission: RE | Admit: 2022-05-15 | Payer: Medicare Other | Source: Ambulatory Visit

## 2022-05-15 DIAGNOSIS — Z170 Estrogen receptor positive status [ER+]: Secondary | ICD-10-CM | POA: Diagnosis not present

## 2022-05-15 DIAGNOSIS — C7931 Secondary malignant neoplasm of brain: Secondary | ICD-10-CM | POA: Diagnosis not present

## 2022-05-15 DIAGNOSIS — C50412 Malignant neoplasm of upper-outer quadrant of left female breast: Secondary | ICD-10-CM | POA: Diagnosis not present

## 2022-05-15 DIAGNOSIS — C50912 Malignant neoplasm of unspecified site of left female breast: Secondary | ICD-10-CM | POA: Diagnosis not present

## 2022-05-15 DIAGNOSIS — C7951 Secondary malignant neoplasm of bone: Secondary | ICD-10-CM | POA: Diagnosis not present

## 2022-05-15 DIAGNOSIS — Z510 Encounter for antineoplastic radiation therapy: Secondary | ICD-10-CM | POA: Diagnosis not present

## 2022-05-16 ENCOUNTER — Other Ambulatory Visit: Payer: Self-pay

## 2022-05-16 ENCOUNTER — Ambulatory Visit
Admission: RE | Admit: 2022-05-16 | Discharge: 2022-05-16 | Disposition: A | Discharge status: Home / Self Care | Payer: Medicare Other | Source: Ambulatory Visit | Attending: Radiation Oncology | Admitting: Radiation Oncology

## 2022-05-16 DIAGNOSIS — C50912 Malignant neoplasm of unspecified site of left female breast: Secondary | ICD-10-CM | POA: Insufficient documentation

## 2022-05-16 DIAGNOSIS — Z510 Encounter for antineoplastic radiation therapy: Secondary | ICD-10-CM | POA: Diagnosis not present

## 2022-05-16 DIAGNOSIS — C7951 Secondary malignant neoplasm of bone: Secondary | ICD-10-CM | POA: Diagnosis not present

## 2022-05-16 DIAGNOSIS — N3090 Cystitis, unspecified without hematuria: Secondary | ICD-10-CM | POA: Diagnosis not present

## 2022-05-16 DIAGNOSIS — C7931 Secondary malignant neoplasm of brain: Secondary | ICD-10-CM | POA: Insufficient documentation

## 2022-05-16 LAB — RAD ONC ARIA SESSION SUMMARY
Course Elapsed Days: 0
Plan Fractions Treated to Date: 1
Plan Prescribed Dose Per Fraction: 2 Gy
Plan Total Fractions Prescribed: 15
Plan Total Prescribed Dose: 30 Gy
Reference Point Dosage Given to Date: 2 Gy
Reference Point Session Dosage Given: 2 Gy
Session Number: 1

## 2022-05-17 ENCOUNTER — Other Ambulatory Visit: Payer: Self-pay

## 2022-05-17 ENCOUNTER — Ambulatory Visit
Admission: RE | Admit: 2022-05-17 | Discharge: 2022-05-17 | Disposition: A | Discharge status: Home / Self Care | Payer: Medicare Other | Source: Ambulatory Visit | Attending: Radiation Oncology | Admitting: Radiation Oncology

## 2022-05-17 ENCOUNTER — Telehealth: Payer: Self-pay | Admitting: *Deleted

## 2022-05-17 DIAGNOSIS — C7951 Secondary malignant neoplasm of bone: Secondary | ICD-10-CM | POA: Diagnosis not present

## 2022-05-17 DIAGNOSIS — C50912 Malignant neoplasm of unspecified site of left female breast: Secondary | ICD-10-CM | POA: Diagnosis not present

## 2022-05-17 DIAGNOSIS — Z510 Encounter for antineoplastic radiation therapy: Secondary | ICD-10-CM | POA: Diagnosis not present

## 2022-05-17 DIAGNOSIS — N3090 Cystitis, unspecified without hematuria: Secondary | ICD-10-CM | POA: Diagnosis not present

## 2022-05-17 LAB — RAD ONC ARIA SESSION SUMMARY
Course Elapsed Days: 1
Plan Fractions Treated to Date: 2
Plan Prescribed Dose Per Fraction: 2 Gy
Plan Total Fractions Prescribed: 15
Plan Total Prescribed Dose: 30 Gy
Reference Point Dosage Given to Date: 4 Gy
Reference Point Session Dosage Given: 2 Gy
Session Number: 2

## 2022-05-17 NOTE — Telephone Encounter (Signed)
Patient called asking about ordering a walker which she had discussed at her last appointment with Dr Orlie Dakin. She will be in the office for radiation therapy at [phone] if you want to give her a paper prescription or however you want to order it. Please let her know  Also she wants Dr Orlie Dakin to know that the oral surgeon said no to going back on calcium and mentioned maybe going to [facility] for Bariatric pressure treatment

## 2022-05-18 ENCOUNTER — Other Ambulatory Visit: Payer: Self-pay | Admitting: *Deleted

## 2022-05-18 ENCOUNTER — Other Ambulatory Visit: Payer: Self-pay

## 2022-05-18 ENCOUNTER — Ambulatory Visit
Admission: RE | Admit: 2022-05-18 | Discharge: 2022-05-18 | Disposition: A | Discharge status: Home / Self Care | Payer: Medicare Other | Source: Ambulatory Visit | Attending: Radiation Oncology | Admitting: Radiation Oncology

## 2022-05-18 DIAGNOSIS — N3090 Cystitis, unspecified without hematuria: Secondary | ICD-10-CM | POA: Diagnosis not present

## 2022-05-18 DIAGNOSIS — Z510 Encounter for antineoplastic radiation therapy: Secondary | ICD-10-CM | POA: Diagnosis not present

## 2022-05-18 DIAGNOSIS — C50912 Malignant neoplasm of unspecified site of left female breast: Secondary | ICD-10-CM | POA: Diagnosis not present

## 2022-05-18 DIAGNOSIS — C50919 Malignant neoplasm of unspecified site of unspecified female breast: Secondary | ICD-10-CM

## 2022-05-18 DIAGNOSIS — C7951 Secondary malignant neoplasm of bone: Secondary | ICD-10-CM | POA: Diagnosis not present

## 2022-05-18 LAB — RAD ONC ARIA SESSION SUMMARY
Course Elapsed Days: 2
Plan Fractions Treated to Date: 3
Plan Prescribed Dose Per Fraction: 2 Gy
Plan Total Fractions Prescribed: 15
Plan Total Prescribed Dose: 30 Gy
Reference Point Dosage Given to Date: 6 Gy
Reference Point Session Dosage Given: 2 Gy
Session Number: 3

## 2022-05-21 ENCOUNTER — Ambulatory Visit
Admission: RE | Admit: 2022-05-21 | Discharge: 2022-05-21 | Disposition: A | Discharge status: Home / Self Care | Payer: Medicare Other | Source: Ambulatory Visit | Attending: Radiation Oncology | Admitting: Radiation Oncology

## 2022-05-21 ENCOUNTER — Inpatient Hospital Stay: Payer: Medicare Other | Attending: Oncology

## 2022-05-21 ENCOUNTER — Other Ambulatory Visit: Payer: Self-pay

## 2022-05-21 DIAGNOSIS — C7951 Secondary malignant neoplasm of bone: Secondary | ICD-10-CM | POA: Diagnosis not present

## 2022-05-21 DIAGNOSIS — C50919 Malignant neoplasm of unspecified site of unspecified female breast: Secondary | ICD-10-CM

## 2022-05-21 DIAGNOSIS — N3090 Cystitis, unspecified without hematuria: Secondary | ICD-10-CM | POA: Diagnosis not present

## 2022-05-21 DIAGNOSIS — C50912 Malignant neoplasm of unspecified site of left female breast: Secondary | ICD-10-CM | POA: Diagnosis not present

## 2022-05-21 DIAGNOSIS — Z510 Encounter for antineoplastic radiation therapy: Secondary | ICD-10-CM | POA: Diagnosis not present

## 2022-05-21 LAB — CBC (CANCER CENTER ONLY)
HCT: 31 % — ABNORMAL LOW (ref 36.0–46.0)
Hemoglobin: 10.3 g/dL — ABNORMAL LOW (ref 12.0–15.0)
MCH: 32 pg (ref 26.0–34.0)
MCHC: 33.2 g/dL (ref 30.0–36.0)
MCV: 96.3 fL (ref 80.0–100.0)
Platelet Count: 129 10*3/uL — ABNORMAL LOW (ref 150–400)
RBC: 3.22 MIL/uL — ABNORMAL LOW (ref 3.87–5.11)
RDW: 14.8 % (ref 11.5–15.5)
WBC Count: 5.7 10*3/uL (ref 4.0–10.5)
nRBC: 0 % (ref 0.0–0.2)

## 2022-05-21 LAB — RAD ONC ARIA SESSION SUMMARY
Course Elapsed Days: 5
Plan Fractions Treated to Date: 4
Plan Prescribed Dose Per Fraction: 2 Gy
Plan Total Fractions Prescribed: 15
Plan Total Prescribed Dose: 30 Gy
Reference Point Dosage Given to Date: 8 Gy
Reference Point Session Dosage Given: 2 Gy
Session Number: 4

## 2022-05-22 ENCOUNTER — Ambulatory Visit
Admission: RE | Admit: 2022-05-22 | Discharge: 2022-05-22 | Disposition: A | Discharge status: Home / Self Care | Payer: Medicare Other | Source: Ambulatory Visit | Attending: Radiation Oncology | Admitting: Radiation Oncology

## 2022-05-22 ENCOUNTER — Encounter: Payer: Self-pay | Admitting: Hospice and Palliative Medicine

## 2022-05-22 ENCOUNTER — Other Ambulatory Visit: Payer: Self-pay

## 2022-05-22 ENCOUNTER — Inpatient Hospital Stay: Payer: Medicare Other

## 2022-05-22 ENCOUNTER — Inpatient Hospital Stay (HOSPITAL_BASED_OUTPATIENT_CLINIC_OR_DEPARTMENT_OTHER): Payer: Medicare Other | Admitting: Hospice and Palliative Medicine

## 2022-05-22 ENCOUNTER — Telehealth: Payer: Self-pay | Admitting: *Deleted

## 2022-05-22 VITALS — BP 136/65 | HR 79 | Temp 98.6°F | Resp 18

## 2022-05-22 DIAGNOSIS — R300 Dysuria: Secondary | ICD-10-CM

## 2022-05-22 DIAGNOSIS — C50912 Malignant neoplasm of unspecified site of left female breast: Secondary | ICD-10-CM | POA: Diagnosis not present

## 2022-05-22 DIAGNOSIS — C7951 Secondary malignant neoplasm of bone: Secondary | ICD-10-CM | POA: Diagnosis not present

## 2022-05-22 DIAGNOSIS — Z170 Estrogen receptor positive status [ER+]: Secondary | ICD-10-CM | POA: Diagnosis not present

## 2022-05-22 DIAGNOSIS — C50919 Malignant neoplasm of unspecified site of unspecified female breast: Secondary | ICD-10-CM

## 2022-05-22 DIAGNOSIS — N3090 Cystitis, unspecified without hematuria: Secondary | ICD-10-CM | POA: Diagnosis not present

## 2022-05-22 DIAGNOSIS — C50412 Malignant neoplasm of upper-outer quadrant of left female breast: Secondary | ICD-10-CM | POA: Diagnosis not present

## 2022-05-22 DIAGNOSIS — R21 Rash and other nonspecific skin eruption: Secondary | ICD-10-CM

## 2022-05-22 DIAGNOSIS — Z510 Encounter for antineoplastic radiation therapy: Secondary | ICD-10-CM | POA: Diagnosis not present

## 2022-05-22 LAB — RAD ONC ARIA SESSION SUMMARY
Course Elapsed Days: 6
Plan Fractions Treated to Date: 5
Plan Prescribed Dose Per Fraction: 2 Gy
Plan Total Fractions Prescribed: 15
Plan Total Prescribed Dose: 30 Gy
Reference Point Dosage Given to Date: 10 Gy
Reference Point Session Dosage Given: 2 Gy
Session Number: 5

## 2022-05-22 LAB — URINALYSIS, COMPLETE (UACMP) WITH MICROSCOPIC
Bilirubin Urine: NEGATIVE
Glucose, UA: NEGATIVE mg/dL
Ketones, ur: NEGATIVE mg/dL
Nitrite: NEGATIVE
Protein, ur: NEGATIVE mg/dL
Specific Gravity, Urine: 1.005 (ref 1.005–1.030)
WBC, UA: >50 WBC/hpf (ref 0–5)
pH: 5 (ref 5.0–8.0)

## 2022-05-22 MED ORDER — CEPHALEXIN 500 MG PO CAPS: 500.0000 mg | ORAL_CAPSULE | Freq: Two times a day (BID) | ORAL | 0 refills | Status: DC

## 2022-05-22 NOTE — Telephone Encounter (Signed)
Patient called stating "I think I have a UTI", when asking why she thinks that she reports that starting this morning, she is having to urinate frequently and can only go dribble. She also admits that it is starting to burn with urination, but she does not note a foul odor to urine. She also reports that she has a rash that is on her arms and under her arms that is red, raised and itchy since yesterday. She has been using Cortisone cream on it and that has helped with the itching. She also states that she has been itching on her back at the waist line, but does not see a rash yet. She feels the rash in also on her scalp as it is itching and she feels bumps on it. She has an appointment at 1 for radiation therapy today. Please advise

## 2022-05-22 NOTE — Progress Notes (Signed)
[facility] at [facility] [phone] [phone]  [name], [name], [name], [name] Jim Like, RN as Registered Nurse Orlie Dakin Tollie Pizza, MD as Consulting Physician (Oncology) Carmina Miller, MD as Referring Physician (Radiation Oncology) Chriss Driver, RN as Registered Nurse   NAME OF PATIENT: Alicia [name]  [id number]  18-Jan-1950   DATE OF VISIT: 05/22/22  REASON FOR CONSULT: Alicia [name] is a 72 y.o. 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   INTERVAL HISTORY: Patient saw [name] on 05/03/2022.  Recent PET scan March 2024 showed progression of osseous metastatic disease.  Patient was referred to radiation oncology for treatment of her hip.  Patient was rotated from [place] and Faslodex to Afinitor or plus Aromasin.  Patient presents to [facility] today for evaluation of urinary frequency/urgency in addition to a new rash on her chest and back.  Patient states that she awoke this morning and noted dysuria, urinary frequency and urgency.  She has a history of UTIs and states that this feels similar.  She denies hematuria, fever or chills.  No suprapubic or back pain.  Additionally, patient reports several days of an itchy rash on her back, flank, sides, axilla, and chest.  She says that she tried on a a friend's sweater early on the day that the rash appeared.  She is unsure what detergent might have been used.  She denies any other changes in lotions, soaps, etc.  No changes in medications or foods.  Denies any neurologic complaints. Denies recent fevers or illnesses. Denies any easy bleeding or bruising. Reports good appetite and denies weight loss. Denies chest pain. Denies any nausea, vomiting, constipation, or  diarrhea. Patient offers no further specific complaints today.   PAST MEDICAL HISTORY: Past Medical History:  Diagnosis Date   Anxiety    Breast cancer, left (HCC) 07/2017   Depression    Hypertension    Hypothyroidism    Personal history of chemotherapy 2019   LEFT mastectomy-chemo before   Personal history of radiation therapy 03/2018   LEFT mastectomy   Rapid heart rate    Thyroid disease     PAST SURGICAL HISTORY:  Past Surgical History:  Procedure Laterality Date   AXILLARY LYMPH NODE BIOPSY Left 07/16/2017   METASTATIC MAMMARY CARCINOMA   BREAST BIOPSY Left 07/16/2017   [place] bx of left breast mass and left breast LN.  INVASIVE MAMMARY CARCINOMA, NO SPECIAL TYPE.    BREAST EXCISIONAL BIOPSY Right 2001   benign   BREAST LUMPECTOMY WITH SENTINEL LYMPH NODE BIOPSY Left 12/06/2017   Procedure: BREAST LUMPECTOMY WITH SENTINEL LYMPH NODE BX;  Surgeon: Earline Mayotte, MD;  Location: [facility] ORS;  Service: General;  Laterality: Left;   COLONOSCOPY     MASTECTOMY Left 12/23/2017   PORTACATH PLACEMENT Right 08/07/2017   Procedure: INSERTION PORT-A-CATH;  Surgeon: Earline Mayotte, MD;  Location: [facility] ORS;  Service: General;  Laterality: Right;   SIMPLE MASTECTOMY WITH AXILLARY SENTINEL NODE BIOPSY Left 12/23/2017   T2,N2 with 6/7 nodes positive. Whole breast radiation.  Surgeon: Earline Mayotte, MD;  Location: [facility] ORS;  Service: General;  Laterality: Left;   TONSILLECTOMY      HEMATOLOGY/ONCOLOGY HISTORY:  Oncology History Overview Note  Patient is a 72 year old female who recently self palpated a mass on her left breast.  Subsequent imaging and biopsy revealed the above-stated breast cancer.  Case was also discussed extensively at case conference.  Given the size and the stage of patient's malignancy, she will benefit from neoadjuvant chemotherapy using Adriamycin, Cytoxan, and Taxol.  Patient will also require Neulasta support.  Will get CT scan of the chest, abdomen, and  pelvis to assess for any metastatic disease.  Patient will also require port placement and MUGA prior to initiating treatment  CT abdomen/pelvis/chest did not reveal any suspicious lesions concerning for metastatic disease. (08/01/17)  Port-A-Cath placed on 08/07/2017.  Cycle 1 day 1 of AC was given on 08/08/17.     Breast cancer, stage 2, left (HCC)  07/24/2017 Initial Diagnosis   Breast cancer, stage 2, left (HCC)   07/30/2017 Cancer Staging   Staging form: Breast, AJCC 8th Edition - Clinical stage from 07/30/2017: Stage IIA (cT2, cN1, cM0, G2, ER+, PR+, HER2-) - Signed by Jeralyn Ruths, MD on 07/30/2017   08/08/2017 - 10/31/2017 Chemotherapy   The patient had DOXOrubicin (ADRIAMYCIN) chemo injection 130 mg, 60 mg/m2 = 130 mg, Intravenous,  Once, 4 of 4 cycles Administration: 130 mg (08/08/2017), 130 mg (08/22/2017), 130 mg (09/05/2017), 130 mg (09/19/2017) palonosetron (ALOXI) injection 0.25 mg, 0.25 mg, Intravenous,  Once, 4 of 4 cycles Administration: 0.25 mg (08/08/2017), 0.25 mg (08/22/2017), 0.25 mg (09/05/2017), 0.25 mg (09/19/2017) pegfilgrastim-cbqv (UDENYCA) injection 6 mg, 6 mg, Subcutaneous, Once, 4 of 4 cycles Administration: 6 mg (08/09/2017), 6 mg (08/23/2017), 6 mg (09/06/2017), 6 mg (09/20/2017) cyclophosphamide (CYTOXAN) 1,300 mg in sodium chloride 0.9 % 250 mL chemo infusion, 600 mg/m2 = 1,300 mg, Intravenous,  Once, 4 of 4 cycles Administration: 1,300 mg (08/08/2017), 1,300 mg (08/22/2017), 1,300 mg (09/05/2017), 1,300 mg (09/19/2017) PACLitaxel (TAXOL) 174 mg in sodium chloride 0.9 % 250 mL chemo infusion (</= 80mg /m2), 80 mg/m2 = 174 mg, Intravenous,  Once, 4 of 12 cycles Dose modification: 72 mg/m2 (original dose 80 mg/m2, Cycle 6, Reason: Dose not tolerated) Administration: 174 mg (10/03/2017), 156 mg (10/17/2017), 156 mg (10/24/2017), 156 mg (10/31/2017) fosaprepitant (EMEND) 150 mg, dexamethasone (DECADRON) 12 mg in sodium chloride 0.9 % 145 mL IVPB, , Intravenous,  Once, 4 of 4  cycles Administration:  (08/08/2017),  (08/22/2017),  (09/05/2017),  (09/19/2017)  for chemotherapy treatment.      ALLERGIES:  is allergic to sulfa antibiotics.  MEDICATIONS:  Current Outpatient Medications  Medication Sig Dispense Refill   acetaminophen (TYLENOL) 500 MG tablet Take 1,000 mg by mouth every 6 (six) hours as needed for moderate pain or headache.      ALPRAZolam (XANAX) 0.5 MG tablet Take 1 tablet (0.5 mg total) by mouth 2 (two) times daily. 60 tablet 0   amLODipine (NORVASC) 5 MG tablet Take 1 tablet (5 mg total) by mouth daily. 90 tablet 3   aspirin EC 81 MG tablet Take 81 mg by mouth at bedtime.      calcium carbonate (OS-CAL - DOSED IN MG OF ELEMENTAL CALCIUM) 1250 (500 Ca) MG tablet Take 1 tablet by mouth daily with breakfast.     chlorhexidine (PERIDEX) 0.12 % solution SMARTSIG:By Mouth     Cholecalciferol (VITAMIN D3) 125 MCG (5000 UT) CAPS Take 1 capsule by mouth 2 (two) times a day.     dexamethasone (DECADRON) 0.5 MG/5ML solution Swish 10 mLs (1 mg total) by mouth for 2 mins, then spit. Swish 4 (four) times daily. 500 mL 5   docusate sodium (COLACE) 100 MG capsule Take 100 mg by mouth 3 (three)  times daily as needed for mild constipation.     everolimus (AFINITOR) 10 MG tablet Take 1 tablet (10 mg total) by mouth daily. 28 tablet 1   exemestane (AROMASIN) 25 MG tablet Take 1 tablet (25 mg total) by mouth daily after breakfast. 30 tablet 2   hydrochlorothiazide (HYDRODIURIL) 12.5 MG tablet TAKE 1 TABLET(12.5 MG) BY MOUTH DAILY 90 tablet 3   levothyroxine (SYNTHROID) 100 MCG tablet TAKE 1 TABLET BY MOUTH DAILY 90 tablet 3   nystatin-lidocaine-prednisoLONE-diphenhydrAMINE-distilled water-alum & mag hydroxide-simeth Swish and spit 5-52ml four times a day as needed 480 mL 3   olopatadine (PATANOL) 0.1 % ophthalmic solution Place 1 drop into the right eye 2 (two) times daily. 5 mL 12   ondansetron (ZOFRAN) 8 MG tablet Take 1 tablet (8 mg total) by mouth every 8 (eight) hours as  needed for nausea or vomiting. 20 tablet 2   pantoprazole (PROTONIX) 20 MG tablet TAKE 1 TABLET(20 MG) BY MOUTH DAILY 90 tablet 0   potassium chloride SA (KLOR-CON M) 20 MEQ tablet TAKE 1 TABLET BY MOUTH TWICE DAILY 60 tablet 3   PREVIDENT 5000 PLUS 1.1 % CREA dental cream SMARTSIG:Application By Mouth     Probiotic Product (ALIGN PO) Take by mouth.     rosuvastatin (CRESTOR) 5 MG tablet Take 1 tablet (5 mg total) by mouth daily. 90 tablet 3   traMADol (ULTRAM) 50 MG tablet Take 1 tablet (50 mg total) by mouth every 6 (six) hours as needed. 60 tablet 0   traZODone (DESYREL) 50 MG tablet TAKE 1 TABLET BY MOUTH DAILY 30 tablet 6   triamcinolone cream (KENALOG) 0.1 % Apply 1 application. topically 2 (two) times daily. 30 g 0   No current facility-administered medications for this visit.    VITAL SIGNS: BP 136/65   Pulse 79   Resp 18  There were no vitals filed for this visit.  Estimated body mass index is 33.25 kg/m as calculated from the following:   Height as of 05/03/22: 5\' 6"  (1.676 m).   Weight as of 05/08/22: 206 lb (93.4 kg).  LABS: CBC:    Component Value Date/Time   WBC 5.7 05/21/2022 1256   WBC 5.1 05/03/2022 1415   HGB 10.3 (L) 05/21/2022 1256   HGB 14.2 02/11/2011 1349   HCT 31.0 (L) 05/21/2022 1256   HCT 40.9 02/11/2011 1349   PLT 129 (L) 05/21/2022 1256   PLT 151 02/11/2011 1349   MCV 96.3 05/21/2022 1256   MCV 89 02/11/2011 1349   NEUTROABS 3.8 05/03/2022 1415   LYMPHSABS 0.4 (L) 05/03/2022 1415   MONOABS 0.8 05/03/2022 1415   EOSABS 0.1 05/03/2022 1415   BASOSABS 0.0 05/03/2022 1415   Comprehensive Metabolic Panel:    Component Value Date/Time   NA 136 05/03/2022 1415   NA 142 02/11/2011 1349   K 2.7 (LL) 05/03/2022 1415   K 3.8 02/11/2011 1349   CL 97 (L) 05/03/2022 1415   CL 107 02/11/2011 1349   CO2 27 05/03/2022 1415   CO2 24 02/11/2011 1349   BUN 22 05/03/2022 1415   BUN 14 02/11/2011 1349   CREATININE 1.69 (H) 05/03/2022 1415   CREATININE 0.65  02/11/2011 1349   GLUCOSE 149 (H) 05/03/2022 1415   GLUCOSE 98 02/11/2011 1349   CALCIUM 9.1 05/03/2022 1415   CALCIUM 9.0 02/11/2011 1349   AST 75 (H) 05/03/2022 1415   AST 27 02/11/2011 1349   ALT 53 (H) 05/03/2022 1415   ALT 36 02/11/2011 1349  ALKPHOS 109 05/03/2022 1415   ALKPHOS 122 02/11/2011 1349   BILITOT 0.7 05/03/2022 1415   BILITOT 0.5 02/11/2011 1349   PROT 7.4 05/03/2022 1415   PROT 7.0 02/11/2011 1349   ALBUMIN 3.8 05/03/2022 1415   ALBUMIN 4.1 02/11/2011 1349    RADIOGRAPHIC STUDIES: No results found.  PERFORMANCE STATUS (ECOG) : 2 - Symptomatic, <50% confined to bed  Review of Systems Unless otherwise noted, a complete review of systems is negative.  Physical Exam General: NAD Cardiovascular: regular rate and rhythm Pulmonary: clear ant fields Abdomen: soft, nontender, + bowel sounds GU: no suprapubic tenderness, no CVA tenderness Extremities: no edema, no joint deformities Skin: Maculopapular rash to back, axilla, chest Neurological: Weakness but otherwise nonfocal  Exam was chaperoned by Herbert Seta, RN    IMPRESSION/PLAN: Cystitis -start renally dosed cephalexin 500 mg every 12 hours x 5 days  Rash -unclear if this is a drug rash, which would be most likely secondary to Afinitor or contact dermatitis from her sweater.  Patient advised to hold the Afinitor temporarily and she may use topical cortisone cream and antihistamine.  If no improvement later this week, would consider steroids.  Case and plan discussed with [name].  Patient to follow-up next week with [name]  Patient expressed understanding and was in agreement with this plan. She also understands that She can call clinic at any time with any questions, concerns, or complaints.   Thank you for allowing me to participate in the care of this very pleasant patient.   Time Total: 15 minutes  Visit consisted of counseling and education dealing with the complex and emotionally  intense issues of symptom management in the setting of serious illness.Greater than 50%  of this time was spent counseling and coordinating care related to the above assessment and plan.  Signed by: Laurette Schimke, PhD, NP-C

## 2022-05-23 ENCOUNTER — Ambulatory Visit
Admission: RE | Admit: 2022-05-23 | Discharge: 2022-05-23 | Disposition: A | Discharge status: Home / Self Care | Payer: Medicare Other | Source: Ambulatory Visit | Attending: Radiation Oncology | Admitting: Radiation Oncology

## 2022-05-23 ENCOUNTER — Other Ambulatory Visit: Payer: Self-pay

## 2022-05-23 DIAGNOSIS — Z510 Encounter for antineoplastic radiation therapy: Secondary | ICD-10-CM | POA: Diagnosis not present

## 2022-05-23 DIAGNOSIS — C50912 Malignant neoplasm of unspecified site of left female breast: Secondary | ICD-10-CM | POA: Diagnosis not present

## 2022-05-23 DIAGNOSIS — C7951 Secondary malignant neoplasm of bone: Secondary | ICD-10-CM | POA: Diagnosis not present

## 2022-05-23 DIAGNOSIS — N3090 Cystitis, unspecified without hematuria: Secondary | ICD-10-CM | POA: Diagnosis not present

## 2022-05-23 LAB — URINE CULTURE: Culture: NO GROWTH

## 2022-05-23 LAB — RAD ONC ARIA SESSION SUMMARY
Course Elapsed Days: 7
Plan Fractions Treated to Date: 6
Plan Prescribed Dose Per Fraction: 2 Gy
Plan Total Fractions Prescribed: 15
Plan Total Prescribed Dose: 30 Gy
Reference Point Dosage Given to Date: 12 Gy
Reference Point Session Dosage Given: 2 Gy
Session Number: 6

## 2022-05-24 ENCOUNTER — Other Ambulatory Visit: Payer: Self-pay

## 2022-05-24 ENCOUNTER — Ambulatory Visit
Admission: RE | Admit: 2022-05-24 | Discharge: 2022-05-24 | Disposition: A | Discharge status: Home / Self Care | Payer: Medicare Other | Source: Ambulatory Visit | Attending: Radiation Oncology | Admitting: Radiation Oncology

## 2022-05-24 ENCOUNTER — Other Ambulatory Visit: Payer: Self-pay | Admitting: *Deleted

## 2022-05-24 DIAGNOSIS — R21 Rash and other nonspecific skin eruption: Secondary | ICD-10-CM

## 2022-05-24 DIAGNOSIS — C7951 Secondary malignant neoplasm of bone: Secondary | ICD-10-CM | POA: Diagnosis not present

## 2022-05-24 DIAGNOSIS — Z510 Encounter for antineoplastic radiation therapy: Secondary | ICD-10-CM | POA: Diagnosis not present

## 2022-05-24 DIAGNOSIS — C50912 Malignant neoplasm of unspecified site of left female breast: Secondary | ICD-10-CM | POA: Diagnosis not present

## 2022-05-24 DIAGNOSIS — N3090 Cystitis, unspecified without hematuria: Secondary | ICD-10-CM | POA: Diagnosis not present

## 2022-05-24 LAB — RAD ONC ARIA SESSION SUMMARY
Course Elapsed Days: 8
Plan Fractions Treated to Date: 7
Plan Prescribed Dose Per Fraction: 2 Gy
Plan Total Fractions Prescribed: 15
Plan Total Prescribed Dose: 30 Gy
Reference Point Dosage Given to Date: 14 Gy
Reference Point Session Dosage Given: 2 Gy
Session Number: 7

## 2022-05-24 MED ORDER — PREDNISONE 10 MG PO TABS: ORAL_TABLET | ORAL | 0 refills | Status: DC

## 2022-05-24 NOTE — Telephone Encounter (Signed)
Contacted patient to review Urine culture results. Patient informed urine culture did not demonstrate any growth. Pt will continue her course of antibiotics. Pt reports that her rash is a little improved but she has increase sensation of iching. I spoke with Sharia Reeve, NP- who recommend pt to go ahead and start on prednisone taper.- script pended for provider's signature.- "prednisone taper 40mg  x 2 days, 30mg  x 2 days, 20mg  x 2 days, 10mg  x 2 days, then stop"

## 2022-05-25 ENCOUNTER — Other Ambulatory Visit (HOSPITAL_COMMUNITY): Payer: Self-pay

## 2022-05-25 ENCOUNTER — Ambulatory Visit
Admission: RE | Admit: 2022-05-25 | Discharge: 2022-05-25 | Disposition: A | Discharge status: Home / Self Care | Payer: Medicare Other | Source: Ambulatory Visit | Attending: Radiation Oncology | Admitting: Radiation Oncology

## 2022-05-25 ENCOUNTER — Other Ambulatory Visit: Payer: Self-pay

## 2022-05-25 DIAGNOSIS — C50912 Malignant neoplasm of unspecified site of left female breast: Secondary | ICD-10-CM | POA: Diagnosis not present

## 2022-05-25 DIAGNOSIS — C7951 Secondary malignant neoplasm of bone: Secondary | ICD-10-CM | POA: Diagnosis not present

## 2022-05-25 DIAGNOSIS — Z510 Encounter for antineoplastic radiation therapy: Secondary | ICD-10-CM | POA: Diagnosis not present

## 2022-05-25 DIAGNOSIS — N3090 Cystitis, unspecified without hematuria: Secondary | ICD-10-CM | POA: Diagnosis not present

## 2022-05-25 LAB — RAD ONC ARIA SESSION SUMMARY
Course Elapsed Days: 9
Plan Fractions Treated to Date: 8
Plan Prescribed Dose Per Fraction: 2 Gy
Plan Total Fractions Prescribed: 15
Plan Total Prescribed Dose: 30 Gy
Reference Point Dosage Given to Date: 16 Gy
Reference Point Session Dosage Given: 2 Gy
Session Number: 8

## 2022-05-28 ENCOUNTER — Inpatient Hospital Stay: Payer: Medicare Other

## 2022-05-28 ENCOUNTER — Other Ambulatory Visit: Payer: Self-pay

## 2022-05-28 ENCOUNTER — Ambulatory Visit
Admission: RE | Admit: 2022-05-28 | Discharge: 2022-05-28 | Disposition: A | Discharge status: Home / Self Care | Payer: Medicare Other | Source: Ambulatory Visit | Attending: Radiation Oncology | Admitting: Radiation Oncology

## 2022-05-28 DIAGNOSIS — C7951 Secondary malignant neoplasm of bone: Secondary | ICD-10-CM | POA: Diagnosis not present

## 2022-05-28 DIAGNOSIS — N3090 Cystitis, unspecified without hematuria: Secondary | ICD-10-CM | POA: Diagnosis not present

## 2022-05-28 DIAGNOSIS — C50912 Malignant neoplasm of unspecified site of left female breast: Secondary | ICD-10-CM | POA: Diagnosis not present

## 2022-05-28 DIAGNOSIS — Z510 Encounter for antineoplastic radiation therapy: Secondary | ICD-10-CM | POA: Diagnosis not present

## 2022-05-28 LAB — RAD ONC ARIA SESSION SUMMARY
Course Elapsed Days: 12
Plan Fractions Treated to Date: 9
Plan Prescribed Dose Per Fraction: 2 Gy
Plan Total Fractions Prescribed: 15
Plan Total Prescribed Dose: 30 Gy
Reference Point Dosage Given to Date: 18 Gy
Reference Point Session Dosage Given: 2 Gy
Session Number: 9

## 2022-05-29 ENCOUNTER — Ambulatory Visit
Admission: RE | Admit: 2022-05-29 | Discharge: 2022-05-29 | Disposition: A | Discharge status: Home / Self Care | Payer: Medicare Other | Source: Ambulatory Visit | Attending: Radiation Oncology | Admitting: Radiation Oncology

## 2022-05-29 ENCOUNTER — Other Ambulatory Visit (HOSPITAL_COMMUNITY): Payer: Self-pay

## 2022-05-29 ENCOUNTER — Other Ambulatory Visit: Payer: Self-pay

## 2022-05-29 DIAGNOSIS — C50912 Malignant neoplasm of unspecified site of left female breast: Secondary | ICD-10-CM | POA: Diagnosis not present

## 2022-05-29 DIAGNOSIS — C50412 Malignant neoplasm of upper-outer quadrant of left female breast: Secondary | ICD-10-CM | POA: Diagnosis not present

## 2022-05-29 DIAGNOSIS — Z510 Encounter for antineoplastic radiation therapy: Secondary | ICD-10-CM | POA: Diagnosis not present

## 2022-05-29 DIAGNOSIS — Z170 Estrogen receptor positive status [ER+]: Secondary | ICD-10-CM | POA: Diagnosis not present

## 2022-05-29 DIAGNOSIS — N3090 Cystitis, unspecified without hematuria: Secondary | ICD-10-CM | POA: Diagnosis not present

## 2022-05-29 DIAGNOSIS — C7951 Secondary malignant neoplasm of bone: Secondary | ICD-10-CM | POA: Diagnosis not present

## 2022-05-29 LAB — RAD ONC ARIA SESSION SUMMARY
Course Elapsed Days: 13
Plan Fractions Treated to Date: 10
Plan Prescribed Dose Per Fraction: 2 Gy
Plan Total Fractions Prescribed: 15
Plan Total Prescribed Dose: 30 Gy
Reference Point Dosage Given to Date: 20 Gy
Reference Point Session Dosage Given: 2 Gy
Session Number: 10

## 2022-05-30 ENCOUNTER — Other Ambulatory Visit: Payer: Self-pay

## 2022-05-30 ENCOUNTER — Ambulatory Visit
Admission: RE | Admit: 2022-05-30 | Discharge: 2022-05-30 | Disposition: A | Discharge status: Home / Self Care | Payer: Medicare Other | Source: Ambulatory Visit | Attending: Radiation Oncology | Admitting: Radiation Oncology

## 2022-05-30 DIAGNOSIS — Z510 Encounter for antineoplastic radiation therapy: Secondary | ICD-10-CM | POA: Diagnosis not present

## 2022-05-30 DIAGNOSIS — N3090 Cystitis, unspecified without hematuria: Secondary | ICD-10-CM | POA: Diagnosis not present

## 2022-05-30 DIAGNOSIS — C7951 Secondary malignant neoplasm of bone: Secondary | ICD-10-CM | POA: Diagnosis not present

## 2022-05-30 DIAGNOSIS — C50912 Malignant neoplasm of unspecified site of left female breast: Secondary | ICD-10-CM | POA: Diagnosis not present

## 2022-05-30 LAB — RAD ONC ARIA SESSION SUMMARY
Course Elapsed Days: 14
Plan Fractions Treated to Date: 11
Plan Prescribed Dose Per Fraction: 2 Gy
Plan Total Fractions Prescribed: 15
Plan Total Prescribed Dose: 30 Gy
Reference Point Dosage Given to Date: 22 Gy
Reference Point Session Dosage Given: 2 Gy
Session Number: 11

## 2022-05-31 ENCOUNTER — Inpatient Hospital Stay: Payer: Medicare Other | Admitting: Pharmacist

## 2022-05-31 ENCOUNTER — Encounter: Payer: Self-pay | Admitting: Oncology

## 2022-05-31 ENCOUNTER — Ambulatory Visit
Admission: RE | Admit: 2022-05-31 | Discharge: 2022-05-31 | Disposition: A | Discharge status: Home / Self Care | Payer: Medicare Other | Source: Ambulatory Visit | Attending: Radiation Oncology | Admitting: Radiation Oncology

## 2022-05-31 ENCOUNTER — Inpatient Hospital Stay (HOSPITAL_BASED_OUTPATIENT_CLINIC_OR_DEPARTMENT_OTHER): Payer: Medicare Other | Admitting: Oncology

## 2022-05-31 ENCOUNTER — Other Ambulatory Visit: Payer: Self-pay

## 2022-05-31 ENCOUNTER — Inpatient Hospital Stay: Payer: Medicare Other

## 2022-05-31 VITALS — BP 125/58 | HR 67 | Temp 96.4°F | Resp 16 | Ht 66.0 in | Wt 208.0 lb

## 2022-05-31 DIAGNOSIS — C50911 Malignant neoplasm of unspecified site of right female breast: Secondary | ICD-10-CM

## 2022-05-31 DIAGNOSIS — C50912 Malignant neoplasm of unspecified site of left female breast: Secondary | ICD-10-CM

## 2022-05-31 DIAGNOSIS — C7951 Secondary malignant neoplasm of bone: Secondary | ICD-10-CM | POA: Diagnosis not present

## 2022-05-31 DIAGNOSIS — N3090 Cystitis, unspecified without hematuria: Secondary | ICD-10-CM | POA: Diagnosis not present

## 2022-05-31 DIAGNOSIS — Z510 Encounter for antineoplastic radiation therapy: Secondary | ICD-10-CM | POA: Diagnosis not present

## 2022-05-31 LAB — RAD ONC ARIA SESSION SUMMARY
Course Elapsed Days: 15
Plan Fractions Treated to Date: 12
Plan Prescribed Dose Per Fraction: 2 Gy
Plan Total Fractions Prescribed: 15
Plan Total Prescribed Dose: 30 Gy
Reference Point Dosage Given to Date: 24 Gy
Reference Point Session Dosage Given: 2 Gy
Session Number: 12

## 2022-05-31 LAB — COMPREHENSIVE METABOLIC PANEL
ALT: 28 U/L (ref 0–44)
AST: 35 U/L (ref 15–41)
Albumin: 3.7 g/dL (ref 3.5–5.0)
Alkaline Phosphatase: 102 U/L (ref 38–126)
Anion gap: 12 (ref 5–15)
BUN: 27 mg/dL — ABNORMAL HIGH (ref 8–23)
CO2: 26 mmol/L (ref 22–32)
Calcium: 9.4 mg/dL (ref 8.9–10.3)
Chloride: 99 mmol/L (ref 98–111)
Creatinine, Ser: 1.19 mg/dL — ABNORMAL HIGH (ref 0.44–1.00)
GFR, Estimated: 48 mL/min — ABNORMAL LOW (ref >60)
Glucose, Bld: 127 mg/dL — ABNORMAL HIGH (ref 70–99)
Potassium: 3.6 mmol/L (ref 3.5–5.1)
Sodium: 137 mmol/L (ref 135–145)
Total Bilirubin: 0.6 mg/dL (ref 0.3–1.2)
Total Protein: 7 g/dL (ref 6.5–8.1)

## 2022-05-31 LAB — CBC WITH DIFFERENTIAL/PLATELET
Abs Immature Granulocytes: 0.35 10*3/uL — ABNORMAL HIGH (ref 0.00–0.07)
Basophils Absolute: 0 10*3/uL (ref 0.0–0.1)
Basophils Relative: 0 %
Eosinophils Absolute: 0 10*3/uL (ref 0.0–0.5)
Eosinophils Relative: 0 %
HCT: 30.1 % — ABNORMAL LOW (ref 36.0–46.0)
Hemoglobin: 9.9 g/dL — ABNORMAL LOW (ref 12.0–15.0)
Immature Granulocytes: 4 %
Lymphocytes Relative: 3 %
Lymphs Abs: 0.3 10*3/uL — ABNORMAL LOW (ref 0.7–4.0)
MCH: 31.4 pg (ref 26.0–34.0)
MCHC: 32.9 g/dL (ref 30.0–36.0)
MCV: 95.6 fL (ref 80.0–100.0)
Monocytes Absolute: 0.8 10*3/uL (ref 0.1–1.0)
Monocytes Relative: 10 %
Neutro Abs: 6.6 10*3/uL (ref 1.7–7.7)
Neutrophils Relative %: 83 %
Platelets: 172 10*3/uL (ref 150–400)
RBC: 3.15 MIL/uL — ABNORMAL LOW (ref 3.87–5.11)
RDW: 15.7 % — ABNORMAL HIGH (ref 11.5–15.5)
WBC: 8.1 10*3/uL (ref 4.0–10.5)
nRBC: 1.6 % — ABNORMAL HIGH (ref 0.0–0.2)

## 2022-05-31 NOTE — Progress Notes (Signed)
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  INTERVAL HISTORY: Patient returns to clinic today for further evaluation and consideration of reinitiation of Afinitor.  Her rash has since resolved.  She currently feels well.  She does not complain of jaw pain or hip pain.  She does not complain of weakness or fatigue.  She continues to have a mild peripheral neuropathy, but no other neurologic complaints.  She denies any chest pain, shortness of breath, cough, or hemoptysis.  She denies any nausea, vomiting, constipation, or diarrhea.  She has no urinary complaints.  Patient offers no further specific complaints today.  REVIEW OF SYSTEMS:   Review of Systems  Constitutional: Negative.  Negative for fever, malaise/fatigue and weight loss.  HENT:  Negative for congestion.   Respiratory: Negative.  Negative for cough and shortness of breath.   Cardiovascular: Negative.  Negative for chest pain and leg swelling.  Gastrointestinal: Negative.  Negative for abdominal pain, constipation, diarrhea and nausea.  Genitourinary: Negative.  Negative for dysuria, flank pain and urgency.  Musculoskeletal: Negative.  Negative for back pain and joint pain.  Skin: Negative.  Negative for rash.  Neurological:  Positive for tingling and sensory change. Negative for dizziness, focal weakness, weakness and headaches.  Psychiatric/Behavioral:  The patient is not  nervous/anxious.     As per HPI. Otherwise, a complete review of systems is negative.  PAST MEDICAL HISTORY: Past Medical History:  Diagnosis Date   Anxiety    Breast cancer, left (HCC) 07/2017   Depression    Hypertension    Hypothyroidism    Personal history of chemotherapy 2019   LEFT mastectomy-chemo before   Personal history of radiation therapy 03/2018   LEFT mastectomy   Rapid heart rate    Thyroid disease     PAST SURGICAL HISTORY: Past Surgical History:  Procedure Laterality Date   AXILLARY LYMPH NODE BIOPSY Left 07/16/2017   METASTATIC MAMMARY CARCINOMA   BREAST BIOPSY Left 07/16/2017   [place] bx of left breast mass and left breast LN.  INVASIVE MAMMARY CARCINOMA, NO SPECIAL TYPE.    BREAST EXCISIONAL BIOPSY Right 2001   benign   BREAST LUMPECTOMY WITH SENTINEL LYMPH NODE BIOPSY Left 12/06/2017   Procedure: BREAST LUMPECTOMY WITH SENTINEL LYMPH NODE BX;  Surgeon: Earline Mayotte, MD;  Location: [facility] ORS;  Service: General;  Laterality: Left;   COLONOSCOPY     MASTECTOMY Left 12/23/2017   PORTACATH PLACEMENT Right 08/07/2017   Procedure: INSERTION PORT-A-CATH;  Surgeon: Earline Mayotte, MD;  Location: [facility] ORS;  Service: General;  Laterality: Right;   SIMPLE MASTECTOMY WITH AXILLARY SENTINEL NODE BIOPSY Left 12/23/2017   T2,N2 with 6/7 nodes positive. Whole breast radiation.  Surgeon: Earline Mayotte, MD;  Location: [facility] ORS;  Service: General;  Laterality: Left;   TONSILLECTOMY      FAMILY HISTORY: Family History  Problem Relation Age of Onset   Stroke Mother    Thyroid disease Mother    Renal Disease Mother    Stroke Father    Heart  attack Father    Sudden death Father 35       suicide   Anuerysm Brother    Breast cancer Neg Hx     ADVANCED DIRECTIVES (Y/N):  N  HEALTH MAINTENANCE: Social History   Tobacco Use   Smoking status: Former    Packs/day: 1.00    Years: 30.00    Additional pack years: 0.00    Total pack years: 30.00    Types:  Cigarettes    Quit date: 2019    Years since quitting: 5.3   Smokeless tobacco: Never  Vaping Use   Vaping Use: Never used  Substance Use Topics   Alcohol use: Not Currently   Drug use: Never     Colonoscopy:  PAP:  Bone density:  Lipid panel:  Allergies  Allergen Reactions   Sulfa Antibiotics Diarrhea    Current Outpatient Medications  Medication Sig Dispense Refill   acetaminophen (TYLENOL) 500 MG tablet Take 1,000 mg by mouth every 6 (six) hours as needed for moderate pain or headache.      ALPRAZolam (XANAX) 0.5 MG tablet Take 1 tablet (0.5 mg total) by mouth 2 (two) times daily. 60 tablet 0   amLODipine (NORVASC) 5 MG tablet Take 1 tablet (5 mg total) by mouth daily. 90 tablet 3   aspirin EC 81 MG tablet Take 81 mg by mouth at bedtime.      calcium carbonate (OS-CAL - DOSED IN MG OF ELEMENTAL CALCIUM) 1250 (500 Ca) MG tablet Take 1 tablet by mouth daily with breakfast.     chlorhexidine (PERIDEX) 0.12 % solution SMARTSIG:By Mouth     Cholecalciferol (VITAMIN D3) 125 MCG (5000 UT) CAPS Take 1 capsule by mouth 2 (two) times a day.     dexamethasone (DECADRON) 0.5 MG/5ML solution Swish 10 mLs (1 mg total) by mouth for 2 mins, then spit. Swish 4 (four) times daily. 500 mL 5   docusate sodium (COLACE) 100 MG capsule Take 100 mg by mouth 3 (three) times daily as needed for mild constipation.     exemestane (AROMASIN) 25 MG tablet Take 1 tablet (25 mg total) by mouth daily after breakfast. 30 tablet 2   hydrochlorothiazide (HYDRODIURIL) 12.5 MG tablet TAKE 1 TABLET(12.5 MG) BY MOUTH DAILY 90 tablet 3   levothyroxine (SYNTHROID) 100 MCG tablet TAKE 1 TABLET BY MOUTH DAILY 90 tablet 3   nystatin-lidocaine-prednisoLONE-diphenhydrAMINE-distilled water-alum & mag hydroxide-simeth Swish and spit 5-34ml four times a day as needed 480 mL 3   olopatadine (PATANOL) 0.1 % ophthalmic solution Place 1 drop into the right eye 2 (two) times daily. 5 mL 12   ondansetron (ZOFRAN) 8 MG tablet Take 1  tablet (8 mg total) by mouth every 8 (eight) hours as needed for nausea or vomiting. 20 tablet 2   pantoprazole (PROTONIX) 20 MG tablet TAKE 1 TABLET(20 MG) BY MOUTH DAILY 90 tablet 0   potassium chloride SA (KLOR-CON M) 20 MEQ tablet TAKE 1 TABLET BY MOUTH TWICE DAILY 60 tablet 3   predniSONE (DELTASONE) 10 MG tablet prednisone taper 40mg  x 2 days, 30mg  x 2 days, 20mg  x 2 days, 10mg  x 2 days, then stop 10 tablet 0   PREVIDENT 5000 PLUS 1.1 % CREA dental cream SMARTSIG:Application By Mouth     Probiotic Product (ALIGN PO) Take by mouth.     rosuvastatin (CRESTOR) 5 MG tablet Take 1 tablet (5 mg total) by mouth daily. 90 tablet 3   traMADol (ULTRAM) 50 MG tablet Take 1 tablet (50  mg total) by mouth every 6 (six) hours as needed. 60 tablet 0   traZODone (DESYREL) 50 MG tablet TAKE 1 TABLET BY MOUTH DAILY 30 tablet 6   triamcinolone cream (KENALOG) 0.1 % Apply 1 application. topically 2 (two) times daily. 30 g 0   everolimus (AFINITOR) 10 MG tablet Take 1 tablet (10 mg total) by mouth daily. (Patient not taking: Reported on 05/31/2022) 28 tablet 1   No current facility-administered medications for this visit.    OBJECTIVE: Vitals:   05/31/22 1336  BP: (!) 125/58  Pulse: 67  Resp: 16  Temp: (!) 96.4 F (35.8 C)  SpO2: 98%     Body mass index is 33.57 kg/m.    ECOG FS:1 - Symptomatic but completely ambulatory  General: Well-developed, well-nourished, no acute distress. Eyes: Pink conjunctiva, anicteric sclera. HEENT: Normocephalic, moist mucous membranes. Lungs: No audible wheezing or coughing. Heart: Regular rate and rhythm. Abdomen: Soft, nontender, no obvious distention. Musculoskeletal: No edema, cyanosis, or clubbing. Neuro: Alert, answering all questions appropriately. Cranial nerves grossly intact. Skin: No rashes or petechiae noted. Psych: Normal affect.  LAB RESULTS:  Lab Results  Component Value Date   NA 137 05/31/2022   K 3.6 05/31/2022   CL 99 05/31/2022   CO2 26  05/31/2022   GLUCOSE 127 (H) 05/31/2022   BUN 27 (H) 05/31/2022   CREATININE 1.19 (H) 05/31/2022   CALCIUM 9.4 05/31/2022   PROT 7.0 05/31/2022   ALBUMIN 3.7 05/31/2022   AST 35 05/31/2022   ALT 28 05/31/2022   ALKPHOS 102 05/31/2022   BILITOT 0.6 05/31/2022   GFRNONAA 48 (L) 05/31/2022   GFRAA 57 (L) 10/01/2019    Lab Results  Component Value Date   WBC 8.1 05/31/2022   NEUTROABS 6.6 05/31/2022   HGB 9.9 (L) 05/31/2022   HCT 30.1 (L) 05/31/2022   MCV 95.6 05/31/2022   PLT 172 05/31/2022     STUDIES: No results found.   ONCOLOGY HISTORY: Patient initially received neoadjuvant chemotherapy with Adriamycin and Cytoxan followed by weekly Taxol. She only received 4 cycles of weekly Taxol prior to discontinuation of treatment on October 31, 2017 secondary to persistent peripheral neuropathy.  She ultimately required mastectomy and final pathology noted 5 of 6 lymph nodes positive for disease.  Patient completed adjuvant XRT in mid April 2020.  Pain initiated letrozole in May 2020, this was discontinued secondary to side effects and patient was started on anastrozole.  Nuclear medicine bone scan on June 19, 2019 revealed metastatic disease and anastrozole was subsequently discontinued.  PET scan results from October 02, 2021 revealed no clear evidence of metastatic progression.  PET scan results from June 30, 2019 reviewed independently with metastatic bony disease, but no obvious evidence of visceral disease.  MRI of the brain on August 24, 2019 reviewed independently with no obvious evidence of metastatic disease.   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  Patient CA 27-29 has been slowly increasing and her most recent result is 155.9.  PET scan results from March 29, 2022 reviewed independently  with what appears to be progression of disease in her bones.  She continues to have no visceral disease.  After lengthy discussion, it was agreed upon that patient will switch treatments to Afinitor 10 mg daily along with Aromasin 25 mg daily.  Ibrance and Faslodex have  been discontinued.  [name] has been permanently discontinued given her issues with her mandible.  Afinitor was put on hold secondary to possible drug rash, patient has been instructed to reinitiate treatment.  If rash recurs, will discontinue Afinitor altogether.  Return to clinic in 4 weeks for repeat laboratory work and further evaluation.  Appreciate clinical pharmacy input.   Peripheral neuropathy: Chronic and unchanged. History of pulmonary embolus: Patient was diagnosed with a small pulmonary embolus on January 10, 2018.  She is no longer on anticoagulation.  There was no evidence of recurrent PE on CT scan from October 27, 2019. Osteopenia: Patient's most recent bone mineral density on March 03, 2019 reported a T score of -1.8 which is only mildly decreased from 1 year prior when the reported T score was -1.6.  Continue calcium and vitamin D supplementation.  Continue to hold Xgeva as above. Left hip/flank pain: Patient does not complain of pain today.  Continue tramadol as prescribed.  Continue XRT completing treatment on Jun 05, 2022.   Leukopenia: Resolved.  Anemia: Chronic and unchanged.  Patient's hemoglobin is 9.9. Renal insufficiency: Nearly this.  Patient's creatinine is 1.19.   Hypokalemia: Resolved.  Continue oral potassium supplementation. Dental pathology: Significantly improved.  Continue follow up with Dr. Willaim Bane.  He has recommended hyperbaric treatments at [facility]. Transaminitis: Resolved.   Patient expressed understanding and was in agreement with this plan. She also understands that She can call clinic at any time with any questions, concerns, or complaints.    Cancer Staging  Breast cancer, stage 2,  left (HCC) Staging form: Breast, AJCC 8th Edition - Clinical stage from 07/30/2017: Stage IIA (cT2, cN1, cM0, G2, ER+, PR+, HER2-) - Signed by Jeralyn Ruths, MD on 07/30/2017 Histologic grading system: 3 grade system Laterality: Left   Jeralyn Ruths, MD   05/31/2022 3:57 PM

## 2022-05-31 NOTE — Progress Notes (Signed)
Oral Chemotherapy Clinic [facility]  [phone] [phone]  [name], [name], [name], [name] Jim Like, RN as Registered Nurse Orlie Dakin Tollie Pizza, MD as Consulting Physician (Oncology) Carmina Miller, MD as Referring Physician (Radiation Oncology) Chriss Driver, RN as Registered Nurse   Name of the patient: Alicia [name]  [id number]  1950-08-03   Date of visit: 05/31/22  HPI: Patient is a 72 y.o. female with metastatic ER+ HER2- breast cancer. Previously treated with palbociclib, but treatment was changed to Afinitor (everolimus) and exemestane due to disease progression 03/2022. Everolimus held on 05/22/22 due to potential drug rash.   Reason for Consult: Oral chemotherapy follow-up for everolimus and exemestane therapy.   PAST MEDICAL HISTORY: Past Medical History:  Diagnosis Date   Anxiety    Breast cancer, left (HCC) 07/2017   Depression    Hypertension    Hypothyroidism    Personal history of chemotherapy 2019   LEFT mastectomy-chemo before   Personal history of radiation therapy 03/2018   LEFT mastectomy   Rapid heart rate    Thyroid disease     HEMATOLOGY/ONCOLOGY HISTORY:  Oncology History Overview Note  Patient is a 72 year old female who recently self palpated a mass on her left breast.  Subsequent imaging and biopsy revealed the above-stated breast cancer.  Case was also discussed extensively at case conference.  Given the size and the stage of patient's malignancy, she will benefit from neoadjuvant chemotherapy using Adriamycin, Cytoxan, and Taxol.  Patient will also require Neulasta support.  Will get CT scan of the chest, abdomen, and pelvis to assess for any metastatic disease.  Patient will also require port placement and MUGA prior to initiating treatment  CT abdomen/pelvis/chest did not reveal any suspicious  lesions concerning for metastatic disease. (08/01/17)  Port-A-Cath placed on 08/07/2017.  Cycle 1 day 1 of AC was given on 08/08/17.     Breast cancer, stage 2, left (HCC)  07/24/2017 Initial Diagnosis   Breast cancer, stage 2, left (HCC)   07/30/2017 Cancer Staging   Staging form: Breast, AJCC 8th Edition - Clinical stage from 07/30/2017: Stage IIA (cT2, cN1, cM0, G2, ER+, PR+, HER2-) - Signed by Jeralyn Ruths, MD on 07/30/2017   08/08/2017 - 10/31/2017 Chemotherapy   The patient had DOXOrubicin (ADRIAMYCIN) chemo injection 130 mg, 60 mg/m2 = 130 mg, Intravenous,  Once, 4 of 4 cycles Administration: 130 mg (08/08/2017), 130 mg (08/22/2017), 130 mg (09/05/2017), 130 mg (09/19/2017) palonosetron (ALOXI) injection 0.25 mg, 0.25 mg, Intravenous,  Once, 4 of 4 cycles Administration: 0.25 mg (08/08/2017), 0.25 mg (08/22/2017), 0.25 mg (09/05/2017), 0.25 mg (09/19/2017) pegfilgrastim-cbqv (UDENYCA) injection 6 mg, 6 mg, Subcutaneous, Once, 4 of 4 cycles Administration: 6 mg (08/09/2017), 6 mg (08/23/2017), 6 mg (09/06/2017), 6 mg (09/20/2017) cyclophosphamide (CYTOXAN) 1,300 mg in sodium chloride 0.9 % 250 mL chemo infusion, 600 mg/m2 = 1,300 mg, Intravenous,  Once, 4 of 4 cycles Administration: 1,300 mg (08/08/2017), 1,300 mg (08/22/2017), 1,300 mg (09/05/2017), 1,300 mg (09/19/2017) PACLitaxel (TAXOL) 174 mg in sodium chloride 0.9 % 250 mL chemo infusion (</= 80mg /m2), 80 mg/m2 = 174 mg, Intravenous,  Once, 4 of 12 cycles Dose modification: 72 mg/m2 (original dose 80 mg/m2, Cycle 6, Reason: Dose not tolerated) Administration: 174 mg (10/03/2017), 156 mg (10/17/2017), 156 mg (10/24/2017), 156 mg (10/31/2017) fosaprepitant (EMEND) 150 mg, dexamethasone (DECADRON) 12 mg in sodium chloride 0.9 % 145 mL IVPB, , Intravenous,  Once, 4 of 4 cycles Administration:  (08/08/2017),  (08/22/2017),  (09/05/2017),  (09/19/2017)  for chemotherapy treatment.      ALLERGIES:  is allergic to sulfa antibiotics.  MEDICATIONS:  Current  Outpatient Medications  Medication Sig Dispense Refill   acetaminophen (TYLENOL) 500 MG tablet Take 1,000 mg by mouth every 6 (six) hours as needed for moderate pain or headache.      ALPRAZolam (XANAX) 0.5 MG tablet Take 1 tablet (0.5 mg total) by mouth 2 (two) times daily. 60 tablet 0   amLODipine (NORVASC) 5 MG tablet Take 1 tablet (5 mg total) by mouth daily. 90 tablet 3   aspirin EC 81 MG tablet Take 81 mg by mouth at bedtime.      calcium carbonate (OS-CAL - DOSED IN MG OF ELEMENTAL CALCIUM) 1250 (500 Ca) MG tablet Take 1 tablet by mouth daily with breakfast.     chlorhexidine (PERIDEX) 0.12 % solution SMARTSIG:By Mouth     Cholecalciferol (VITAMIN D3) 125 MCG (5000 UT) CAPS Take 1 capsule by mouth 2 (two) times a day.     dexamethasone (DECADRON) 0.5 MG/5ML solution Swish 10 mLs (1 mg total) by mouth for 2 mins, then spit. Swish 4 (four) times daily. 500 mL 5   docusate sodium (COLACE) 100 MG capsule Take 100 mg by mouth 3 (three) times daily as needed for mild constipation.     everolimus (AFINITOR) 10 MG tablet Take 1 tablet (10 mg total) by mouth daily. (Patient not taking: Reported on 05/31/2022) 28 tablet 1   exemestane (AROMASIN) 25 MG tablet Take 1 tablet (25 mg total) by mouth daily after breakfast. 30 tablet 2   hydrochlorothiazide (HYDRODIURIL) 12.5 MG tablet TAKE 1 TABLET(12.5 MG) BY MOUTH DAILY 90 tablet 3   levothyroxine (SYNTHROID) 100 MCG tablet TAKE 1 TABLET BY MOUTH DAILY 90 tablet 3   nystatin-lidocaine-prednisoLONE-diphenhydrAMINE-distilled water-alum & mag hydroxide-simeth Swish and spit 5-66ml four times a day as needed 480 mL 3   olopatadine (PATANOL) 0.1 % ophthalmic solution Place 1 drop into the right eye 2 (two) times daily. 5 mL 12   ondansetron (ZOFRAN) 8 MG tablet Take 1 tablet (8 mg total) by mouth every 8 (eight) hours as needed for nausea or vomiting. 20 tablet 2   pantoprazole (PROTONIX) 20 MG tablet TAKE 1 TABLET(20 MG) BY MOUTH DAILY 90 tablet 0   potassium  chloride SA (KLOR-CON M) 20 MEQ tablet TAKE 1 TABLET BY MOUTH TWICE DAILY 60 tablet 3   predniSONE (DELTASONE) 10 MG tablet prednisone taper 40mg  x 2 days, 30mg  x 2 days, 20mg  x 2 days, 10mg  x 2 days, then stop 10 tablet 0   PREVIDENT 5000 PLUS 1.1 % CREA dental cream SMARTSIG:Application By Mouth     Probiotic Product (ALIGN PO) Take by mouth.     rosuvastatin (CRESTOR) 5 MG tablet Take 1 tablet (5 mg total) by mouth daily. 90 tablet 3   traMADol (ULTRAM) 50 MG tablet Take 1 tablet (50 mg total) by mouth every 6 (six) hours as needed. 60 tablet 0   traZODone (DESYREL) 50 MG tablet TAKE 1 TABLET BY MOUTH DAILY 30 tablet 6   triamcinolone cream (KENALOG) 0.1 % Apply 1 application. topically 2 (two) times daily. 30 g 0   No current facility-administered medications for this visit.    VITAL SIGNS: There were no vitals taken for this visit. There were no vitals filed for this visit.  Estimated body mass index is 33.57 kg/m as calculated from the  following:   Height as of an earlier encounter on 05/31/22: 5\' 6"  (1.676 m).   Weight as of an earlier encounter on 05/31/22: 94.3 kg (208 lb).  LABS: CBC:    Component Value Date/Time   WBC 8.1 05/31/2022 1328   HGB 9.9 (L) 05/31/2022 1328   HGB 10.3 (L) 05/21/2022 1256   HGB 14.2 02/11/2011 1349   HCT 30.1 (L) 05/31/2022 1328   HCT 40.9 02/11/2011 1349   PLT 172 05/31/2022 1328   PLT 129 (L) 05/21/2022 1256   PLT 151 02/11/2011 1349   MCV 95.6 05/31/2022 1328   MCV 89 02/11/2011 1349   NEUTROABS 6.6 05/31/2022 1328   LYMPHSABS 0.3 (L) 05/31/2022 1328   MONOABS 0.8 05/31/2022 1328   EOSABS 0.0 05/31/2022 1328   BASOSABS 0.0 05/31/2022 1328   Comprehensive Metabolic Panel:    Component Value Date/Time   NA 137 05/31/2022 1328   NA 142 02/11/2011 1349   K 3.6 05/31/2022 1328   K 3.8 02/11/2011 1349   CL 99 05/31/2022 1328   CL 107 02/11/2011 1349   CO2 26 05/31/2022 1328   CO2 24 02/11/2011 1349   BUN 27 (H) 05/31/2022 1328   BUN  14 02/11/2011 1349   CREATININE 1.19 (H) 05/31/2022 1328   CREATININE 0.65 02/11/2011 1349   GLUCOSE 127 (H) 05/31/2022 1328   GLUCOSE 98 02/11/2011 1349   CALCIUM 9.4 05/31/2022 1328   CALCIUM 9.0 02/11/2011 1349   AST 35 05/31/2022 1328   AST 27 02/11/2011 1349   ALT 28 05/31/2022 1328   ALT 36 02/11/2011 1349   ALKPHOS 102 05/31/2022 1328   ALKPHOS 122 02/11/2011 1349   BILITOT 0.6 05/31/2022 1328   BILITOT 0.5 02/11/2011 1349   PROT 7.0 05/31/2022 1328   PROT 7.0 02/11/2011 1349   ALBUMIN 3.7 05/31/2022 1328   ALBUMIN 4.1 02/11/2011 1349     Present during today's visit: Patient and her son  Assessment and Plan: CMP/CBC reviewed, resume everolimus 10mg  daily Patient knows to call a report if the rash returns Left hip pain/pelvic pain- currently receiving radiation therapy   Oral Chemotherapy Side Effect/Intolerance:  No current issue as everolimus was on hold  Oral Chemotherapy Adherence: N/A No patient barriers to medication adherence identified.   New medications: none reported  Medication Access Issues: no issues, pt fills at [facility] ([facility]), she will call them today/tomorrow to refill medication  Patient expressed understanding and was in agreement with this plan. She also understands that She can call clinic at any time with any questions, concerns, or complaints.   Follow-up plan: RTC in 4 weeks for re-evaluation   Thank you for allowing me to participate in the care of this very pleasant patient.   Time Total: 15 mins  Visit consisted of counseling and education on dealing with issues of symptom management in the setting of serious and potentially life-threatening illness.Greater than 50%  of this time was spent counseling and coordinating care related to the above assessment and plan.  Signed by: Remi Haggard, PharmD, BCPS, BCOP, CPP Hematology/Oncology Clinical Pharmacist Practitioner [facility]/AP Oral Chemotherapy Navigation  Clinic [phone]

## 2022-06-01 ENCOUNTER — Other Ambulatory Visit (HOSPITAL_COMMUNITY): Payer: Self-pay

## 2022-06-01 ENCOUNTER — Ambulatory Visit
Admission: RE | Admit: 2022-06-01 | Discharge: 2022-06-01 | Disposition: A | Discharge status: Home / Self Care | Payer: Medicare Other | Source: Ambulatory Visit | Attending: Radiation Oncology | Admitting: Radiation Oncology

## 2022-06-01 ENCOUNTER — Other Ambulatory Visit: Payer: Self-pay

## 2022-06-01 ENCOUNTER — Other Ambulatory Visit: Payer: Self-pay | Admitting: Oncology

## 2022-06-01 DIAGNOSIS — C50912 Malignant neoplasm of unspecified site of left female breast: Secondary | ICD-10-CM | POA: Diagnosis not present

## 2022-06-01 DIAGNOSIS — C50919 Malignant neoplasm of unspecified site of unspecified female breast: Secondary | ICD-10-CM

## 2022-06-01 DIAGNOSIS — N3090 Cystitis, unspecified without hematuria: Secondary | ICD-10-CM | POA: Diagnosis not present

## 2022-06-01 DIAGNOSIS — C7951 Secondary malignant neoplasm of bone: Secondary | ICD-10-CM | POA: Diagnosis not present

## 2022-06-01 DIAGNOSIS — Z510 Encounter for antineoplastic radiation therapy: Secondary | ICD-10-CM | POA: Diagnosis not present

## 2022-06-01 LAB — RAD ONC ARIA SESSION SUMMARY
Course Elapsed Days: 16
Plan Fractions Treated to Date: 13
Plan Prescribed Dose Per Fraction: 2 Gy
Plan Total Fractions Prescribed: 15
Plan Total Prescribed Dose: 30 Gy
Reference Point Dosage Given to Date: 26 Gy
Reference Point Session Dosage Given: 2 Gy
Session Number: 13

## 2022-06-01 LAB — CANCER ANTIGEN 27.29: CA 27.29: 168.2 U/mL — ABNORMAL HIGH (ref 0.0–38.6)

## 2022-06-01 MED ORDER — EVEROLIMUS 10 MG PO TABS
10.0000 mg | ORAL_TABLET | Freq: Every day | ORAL | 1 refills | Status: DC
  Filled 2022-06-01: qty 28, 28d supply, fill #0

## 2022-06-01 NOTE — Telephone Encounter (Signed)
CBC with Differential/Platelet Order: [id number] Status: Final result     Visible to patient: Yes (not seen)     Next appt: Today at 12:00 PM in Radiation Oncology [facility])     Dx: Carcinoma of left breast metastatic t...   0 Result Notes          Component Ref Range & Units 1 d ago 11 d ago 4 wk ago 1 mo ago 2 mo ago 3 mo ago 4 mo ago  WBC 4.0 - 10.5 K/uL 8.1 5.7 5.1 2.4 Low  3.1 Low  3.0 Low  3.4 Low   RBC 3.87 - 5.11 MIL/uL 3.15 Low  3.22 Low  2.88 Low  2.65 Low  2.81 Low  2.67 Low  2.76 Low   Hemoglobin 12.0 - 15.0 g/dL 9.9 Low  40.9 Low  9.8 Low  9.3 Low  10.1 Low  9.8 Low  9.9 Low   HCT 36.0 - 46.0 % 30.1 Low  31.0 Low  29.2 Low  28.8 Low  30.1 Low  30.0 Low  29.5 Low   MCV 80.0 - 100.0 fL 95.6 96.3 101.4 High  108.7 High  107.1 High  112.4 High  106.9 High   MCH 26.0 - 34.0 pg 31.4 32.0 34.0 35.1 High  35.9 High  36.7 High  35.9 High   MCHC 30.0 - 36.0 g/dL 81.1 91.4 78.2 95.6 21.3 32.7 33.6  RDW 11.5 - 15.5 % 15.7 High  14.8 13.8 14.6 14.7 14.3 14.2  Platelets 150 - 400 K/uL 172 129 Low  176 175 196 261 241  nRBC 0.0 - 0.2 % 1.6 High  0.0 CM 0.0 0.0 0.0 0.0 0.0  Neutrophils Relative % % 83  75 66 68 72 68  Neutro Abs 1.7 - 7.7 K/uL 6.6  3.8 1.6 Low  2.1 2.2 2.3  Lymphocytes Relative % 3  8 14 14 13 14   Lymphs Abs 0.7 - 4.0 K/uL 0.3 Low   0.4 Low  0.3 Low  0.4 Low  0.4 Low  0.5 Low   Monocytes Relative % 10  15 13 15 11 14   Monocytes Absolute 0.1 - 1.0 K/uL 0.8  0.8 0.3 0.5 0.3 0.5  Eosinophils Relative % 0  1 3 0 1 1  Eosinophils Absolute 0.0 - 0.5 K/uL 0.0  0.1 0.1 0.0 0.0 0.0  Basophils Relative % 0  0 2 2 2 2   Basophils Absolute 0.0 - 0.1 K/uL 0.0  0.0 0.0 0.1 0.1 0.1  Immature Granulocytes % 4  1  1 1 1   Abs Immature Granulocytes 0.00 - 0.07 K/uL 0.35 High   0.07 CM 0.00 CM 0.03 CM 0.02 CM 0.02 CM  Comment: Performed at [facility], [address] [place], [place] [address]  Band Neutrophils    2 R     WBC Morphology    DIFF  CONFIRMED BY MANUAL. RARE ABNORMAL LYMPHOCYTES NOTED, MORPHOLOGY OTHERWISE UNREMARKABLE. MORPHOLOGY UNREMARKABLE    RBC Morphology    UNREMARKABLE MORPHOLOGY UNREMARKABLE    Smear Review    Normal platelet morphology CM Normal platelet morphology CM    Resulting Agency [facility]         Specimen Collected: 05/31/22 13:28 Last Resulted: 05/31/22 13:51      Comprehensive metabolic panel Order: [id number] Status: Final result     Visible to patient: Yes (not seen)  Next appt: Today at 12:00 PM in Radiation Oncology [facility])     Dx: Carcinoma of left breast metastatic t...   0 Result Notes          Component Ref Range & Units 1 d ago 4 wk ago 1 mo ago 2 mo ago 3 mo ago 4 mo ago 5 mo ago  Sodium 135 - 145 mmol/L 137 136 135 135 137 137 133 Low   Potassium 3.5 - 5.1 mmol/L 3.6 2.7 Low Panic  CM 3.7 3.6 3.7 3.5 3.9  Chloride 98 - 111 mmol/L 99 97 Low  101 99 99 99 99  CO2 22 - 32 mmol/L 26 27 25 23 26 25 25   Glucose, Bld 70 - 99 mg/dL 161 High  096 High  CM 116 High  CM 105 High  CM 96 CM 110 High  CM 105 High  CM  Comment: Glucose reference range applies only to samples taken after fasting for at least 8 hours.  BUN 8 - 23 mg/dL 27 High  22 19 27  High  26 High  26 High  24 High   Creatinine, Ser 0.44 - 1.00 mg/dL 0.45 High  4.09 High  8.11 High  1.66 High  1.85 High  1.57 High  1.67 High   Calcium 8.9 - 10.3 mg/dL 9.4 9.1 9.7 91.4 9.9 9.7 9.5  Total Protein 6.5 - 8.1 g/dL 7.0 7.4 7.5 7.9 7.6 7.7 7.6  Albumin 3.5 - 5.0 g/dL 3.7 3.8 3.8 4.0 3.9 3.9 4.0  AST 15 - 41 U/L 35 75 High  23 28 23 30 22   ALT 0 - 44 U/L 28 53 High  12 12 11 17 15   Alkaline Phosphatase 38 - 126 U/L 102 109 96 94 94 105 92  Total Bilirubin 0.3 - 1.2 mg/dL 0.6 0.7 0.9 0.8 0.7 0.8 0.7  GFR, Estimated >60 mL/min 48 Low  32 Low  CM 36 Low  CM 33 Low  CM 29 Low  CM 35 Low  CM 32 Low  CM  Comment: (NOTE) Calculated using  the CKD-EPI Creatinine Equation (2021)  Anion gap 5 - 15 12 12  CM 9 CM 13 CM 12 CM 13 CM 9 CM  Comment: Performed at [facility], [address] [place], [place] [address]  Resulting Agency [facility]         Specimen Collected: 05/31/22 13:28 Last Resulted: 05/31/22 13:55         Cancer antigen 27.29 Order: [id number] Status: Final result     Visible to patient: Yes (seen)     Next appt: Today at 12:00 PM in Radiation Oncology [facility])     Dx: Carcinoma of left breast metastatic t...   0 Result Notes          Component Ref Range & Units 1 d ago 4 wk ago 1 mo ago 2 mo ago 3 mo ago 4 mo ago 5 mo ago  CA 27.29 0.0 - 38.6 U/mL 168.2 High  155.9 High  CM 154.7 High  CM 123.8 High  CM 121.2 High  CM 97.0 High  CM 75.6 High  CM  Comment: (NOTE) Siemens Centaur Immunochemiluminometric Methodology (ICMA) Values obtained with different assay methods or kits cannot be used interchangeably. Results cannot be interpreted as absolute evidence of the presence or absence of malignant disease. Performed At: [facility] [id number] [place], [place] [id number] Jolene Schimke MD WU:[id number]  Resulting Agency [facility] CLIN LAB [facility] CLIN LAB         Specimen Collected: 05/31/22 13:28 Last Resulted: 06/01/22 05:37

## 2022-06-04 ENCOUNTER — Ambulatory Visit
Admission: RE | Admit: 2022-06-04 | Discharge: 2022-06-04 | Disposition: A | Discharge status: Home / Self Care | Payer: Medicare Other | Source: Ambulatory Visit | Attending: Radiation Oncology | Admitting: Radiation Oncology

## 2022-06-04 ENCOUNTER — Other Ambulatory Visit: Payer: Self-pay

## 2022-06-04 ENCOUNTER — Telehealth: Payer: Self-pay | Admitting: Pharmacist

## 2022-06-04 DIAGNOSIS — C50912 Malignant neoplasm of unspecified site of left female breast: Secondary | ICD-10-CM

## 2022-06-04 DIAGNOSIS — C7951 Secondary malignant neoplasm of bone: Secondary | ICD-10-CM | POA: Diagnosis not present

## 2022-06-04 DIAGNOSIS — Z510 Encounter for antineoplastic radiation therapy: Secondary | ICD-10-CM | POA: Diagnosis not present

## 2022-06-04 DIAGNOSIS — R21 Rash and other nonspecific skin eruption: Secondary | ICD-10-CM

## 2022-06-04 DIAGNOSIS — N3090 Cystitis, unspecified without hematuria: Secondary | ICD-10-CM | POA: Diagnosis not present

## 2022-06-04 LAB — RAD ONC ARIA SESSION SUMMARY
Course Elapsed Days: 19
Plan Fractions Treated to Date: 14
Plan Prescribed Dose Per Fraction: 2 Gy
Plan Total Fractions Prescribed: 15
Plan Total Prescribed Dose: 30 Gy
Reference Point Dosage Given to Date: 28 Gy
Reference Point Session Dosage Given: 2 Gy
Session Number: 14

## 2022-06-04 MED ORDER — HYDROCORTISONE 2.5 % EX LOTN
TOPICAL_LOTION | Freq: Two times a day (BID) | CUTANEOUS | 0 refills | Status: DC
Start: 2022-06-04 — End: 2022-08-01

## 2022-06-04 NOTE — Telephone Encounter (Signed)
Oral Chemotherapy Pharmacist Encounter   Patient reported to radiation oncology RN that her rash has reoccurred.   I called patient and asked that she stop her everolimus (her last dose was yesterday 06/03/22). She reports rash on her stomach and back. Prescription for hydrocortisone lotion sent to her local pharmacy.  She knows to begin using the cream and call back to the office if her rash worsened. I asked her to take a picture of her rash in the current state.  She with RTC next week for rash f/u and to determine of everolimus can be resumed.     Remi Haggard, PharmD, BCPS, BCOP, CPP Hematology/Oncology Clinical Pharmacist [place]/[facility]/AP Oral Chemotherapy Navigation Clinic [phone]  06/04/2022 4:06 PM

## 2022-06-05 ENCOUNTER — Ambulatory Visit
Admission: RE | Admit: 2022-06-05 | Discharge: 2022-06-05 | Disposition: A | Discharge status: Home / Self Care | Payer: Medicare Other | Source: Ambulatory Visit | Attending: Radiation Oncology | Admitting: Radiation Oncology

## 2022-06-05 ENCOUNTER — Other Ambulatory Visit: Payer: Self-pay

## 2022-06-05 ENCOUNTER — Other Ambulatory Visit (HOSPITAL_COMMUNITY): Payer: Self-pay

## 2022-06-05 DIAGNOSIS — Z170 Estrogen receptor positive status [ER+]: Secondary | ICD-10-CM | POA: Diagnosis not present

## 2022-06-05 DIAGNOSIS — Z510 Encounter for antineoplastic radiation therapy: Secondary | ICD-10-CM | POA: Diagnosis not present

## 2022-06-05 DIAGNOSIS — C7951 Secondary malignant neoplasm of bone: Secondary | ICD-10-CM | POA: Diagnosis not present

## 2022-06-05 DIAGNOSIS — C50412 Malignant neoplasm of upper-outer quadrant of left female breast: Secondary | ICD-10-CM | POA: Diagnosis not present

## 2022-06-05 DIAGNOSIS — C50912 Malignant neoplasm of unspecified site of left female breast: Secondary | ICD-10-CM | POA: Diagnosis not present

## 2022-06-05 DIAGNOSIS — N3090 Cystitis, unspecified without hematuria: Secondary | ICD-10-CM | POA: Diagnosis not present

## 2022-06-05 LAB — RAD ONC ARIA SESSION SUMMARY
Course Elapsed Days: 20
Plan Fractions Treated to Date: 15
Plan Prescribed Dose Per Fraction: 2 Gy
Plan Total Fractions Prescribed: 15
Plan Total Prescribed Dose: 30 Gy
Reference Point Dosage Given to Date: 30 Gy
Reference Point Session Dosage Given: 2 Gy
Session Number: 15

## 2022-06-05 MED ORDER — EVEROLIMUS 7.5 MG PO TABS
7.5000 mg | ORAL_TABLET | Freq: Every day | ORAL | 0 refills | Status: DC
Start: 2022-06-05 — End: 2022-06-12
  Filled 2022-06-05: qty 28, 28d supply, fill #0

## 2022-06-12 ENCOUNTER — Other Ambulatory Visit: Payer: Self-pay

## 2022-06-12 ENCOUNTER — Inpatient Hospital Stay: Payer: Medicare Other | Admitting: Pharmacist

## 2022-06-12 ENCOUNTER — Other Ambulatory Visit (HOSPITAL_COMMUNITY): Payer: Self-pay

## 2022-06-12 MED ORDER — EVEROLIMUS 7.5 MG PO TABS
7.5000 mg | ORAL_TABLET | Freq: Every day | ORAL | 0 refills | Status: DC
Start: 2022-06-12 — End: 2022-06-28
  Filled 2022-06-12 – 2022-06-14 (×2): qty 14, 14d supply, fill #0

## 2022-06-12 NOTE — Addendum Note (Signed)
Addended by: Remi Haggard on: 06/12/2022 11:45 AM   Modules accepted: Orders

## 2022-06-13 ENCOUNTER — Other Ambulatory Visit: Payer: Self-pay

## 2022-06-13 ENCOUNTER — Other Ambulatory Visit (HOSPITAL_COMMUNITY): Payer: Self-pay

## 2022-06-14 ENCOUNTER — Inpatient Hospital Stay: Payer: Medicare Other

## 2022-06-14 ENCOUNTER — Inpatient Hospital Stay: Payer: Medicare Other | Admitting: Pharmacist

## 2022-06-14 ENCOUNTER — Other Ambulatory Visit: Payer: Self-pay

## 2022-06-14 ENCOUNTER — Other Ambulatory Visit (HOSPITAL_COMMUNITY): Payer: Self-pay

## 2022-06-14 DIAGNOSIS — C50912 Malignant neoplasm of unspecified site of left female breast: Secondary | ICD-10-CM

## 2022-06-14 DIAGNOSIS — N3090 Cystitis, unspecified without hematuria: Secondary | ICD-10-CM | POA: Diagnosis not present

## 2022-06-14 DIAGNOSIS — R197 Diarrhea, unspecified: Secondary | ICD-10-CM

## 2022-06-14 DIAGNOSIS — Z510 Encounter for antineoplastic radiation therapy: Secondary | ICD-10-CM | POA: Diagnosis not present

## 2022-06-14 DIAGNOSIS — C7951 Secondary malignant neoplasm of bone: Secondary | ICD-10-CM | POA: Diagnosis not present

## 2022-06-14 LAB — CMP (CANCER CENTER ONLY)
ALT: 21 U/L (ref 0–44)
AST: 32 U/L (ref 15–41)
Albumin: 3.5 g/dL (ref 3.5–5.0)
Alkaline Phosphatase: 108 U/L (ref 38–126)
Anion gap: 15 (ref 5–15)
BUN: 14 mg/dL (ref 8–23)
CO2: 24 mmol/L (ref 22–32)
Calcium: 9.2 mg/dL (ref 8.9–10.3)
Chloride: 94 mmol/L — ABNORMAL LOW (ref 98–111)
Creatinine: 1.37 mg/dL — ABNORMAL HIGH (ref 0.44–1.00)
GFR, Estimated: 41 mL/min — ABNORMAL LOW (ref >60)
Glucose, Bld: 105 mg/dL — ABNORMAL HIGH (ref 70–99)
Potassium: 3.2 mmol/L — ABNORMAL LOW (ref 3.5–5.1)
Sodium: 133 mmol/L — ABNORMAL LOW (ref 135–145)
Total Bilirubin: 0.9 mg/dL (ref 0.3–1.2)
Total Protein: 7.1 g/dL (ref 6.5–8.1)

## 2022-06-14 LAB — CBC WITH DIFFERENTIAL/PLATELET
Abs Immature Granulocytes: 0.04 10*3/uL (ref 0.00–0.07)
Basophils Absolute: 0 10*3/uL (ref 0.0–0.1)
Basophils Relative: 1 %
Eosinophils Absolute: 0.3 10*3/uL (ref 0.0–0.5)
Eosinophils Relative: 6 %
HCT: 31 % — ABNORMAL LOW (ref 36.0–46.0)
Hemoglobin: 10.1 g/dL — ABNORMAL LOW (ref 12.0–15.0)
Immature Granulocytes: 1 %
Lymphocytes Relative: 6 %
Lymphs Abs: 0.2 10*3/uL — ABNORMAL LOW (ref 0.7–4.0)
MCH: 30.1 pg (ref 26.0–34.0)
MCHC: 32.6 g/dL (ref 30.0–36.0)
MCV: 92.5 fL (ref 80.0–100.0)
Monocytes Absolute: 0.6 10*3/uL (ref 0.1–1.0)
Monocytes Relative: 14 %
Neutro Abs: 3.1 10*3/uL (ref 1.7–7.7)
Neutrophils Relative %: 72 %
Platelets: 156 10*3/uL (ref 150–400)
RBC: 3.35 MIL/uL — ABNORMAL LOW (ref 3.87–5.11)
RDW: 16.8 % — ABNORMAL HIGH (ref 11.5–15.5)
WBC: 4.3 10*3/uL (ref 4.0–10.5)
nRBC: 0.5 % — ABNORMAL HIGH (ref 0.0–0.2)

## 2022-06-14 LAB — MAGNESIUM: Magnesium: 1.8 mg/dL (ref 1.7–2.4)

## 2022-06-14 MED ORDER — SODIUM CHLORIDE 0.9 % IV SOLN
Freq: Once | INTRAVENOUS | Status: AC
  Filled 2022-06-14: qty 250

## 2022-06-14 NOTE — Progress Notes (Signed)
Oral Chemotherapy Clinic [facility]  [phone] [phone]  [name], [name], [name], [name] Jim Like, RN as Registered Nurse Orlie Dakin Tollie Pizza, MD as Consulting Physician (Oncology) Carmina Miller, MD as Referring Physician (Radiation Oncology) Chriss Driver, RN as Registered Nurse   Name of the patient: Alicia [name]  [id number]  [birth year]   Date of visit: 06/14/22  HPI: Patient is a 72 y.o. female with metastatic ER+ HER2- breast cancer. Previously treated with palbociclib, but treatment was changed to Afinitor (everolimus) and exemestane due to disease progression 03/2022. Everolimus 10 mg held on 05/22/22 due to potential drug rash, patient started on steroid taper. Rash cleared and patient resumed everolimus 10 mg on 05/31/22. Patient called to report rash return on 06/04/22. Treatment was again held and prescription for hydrocortisone 2.5% cream sent in.   Reason for Consult: Oral chemotherapy follow-up for everolimus and exemestane therapy.   PAST MEDICAL HISTORY: Past Medical History:  Diagnosis Date   Anxiety    Breast cancer, left (HCC) 07/2017   Depression    Hypertension    Hypothyroidism    Personal history of chemotherapy 2019   LEFT mastectomy-chemo before   Personal history of radiation therapy 03/2018   LEFT mastectomy   Rapid heart rate    Thyroid disease     HEMATOLOGY/ONCOLOGY HISTORY:  Oncology History Overview Note  Patient is a 72 year old female who recently self palpated a mass on her left breast.  Subsequent imaging and biopsy revealed the above-stated breast cancer.  Case was also discussed extensively at case conference.  Given the size and the stage of patient's malignancy, she will benefit from neoadjuvant chemotherapy using Adriamycin, Cytoxan, and Taxol.  Patient will also require Neulasta support.   Will get CT scan of the chest, abdomen, and pelvis to assess for any metastatic disease.  Patient will also require port placement and MUGA prior to initiating treatment  CT abdomen/pelvis/chest did not reveal any suspicious lesions concerning for metastatic disease. (08/01/17)  Port-A-Cath placed on 08/07/2017.  Cycle 1 day 1 of AC was given on 08/08/17.     Breast cancer, stage 2, left (HCC)  07/24/2017 Initial Diagnosis   Breast cancer, stage 2, left (HCC)   07/30/2017 Cancer Staging   Staging form: Breast, AJCC 8th Edition - Clinical stage from 07/30/2017: Stage IIA (cT2, cN1, cM0, G2, ER+, PR+, HER2-) - Signed by Jeralyn Ruths, MD on 07/30/2017   08/08/2017 - 10/31/2017 Chemotherapy   The patient had DOXOrubicin (ADRIAMYCIN) chemo injection 130 mg, 60 mg/m2 = 130 mg, Intravenous,  Once, 4 of 4 cycles Administration: 130 mg (08/08/2017), 130 mg (08/22/2017), 130 mg (09/05/2017), 130 mg (09/19/2017) palonosetron (ALOXI) injection 0.25 mg, 0.25 mg, Intravenous,  Once, 4 of 4 cycles Administration: 0.25 mg (08/08/2017), 0.25 mg (08/22/2017), 0.25 mg (09/05/2017), 0.25 mg (09/19/2017) pegfilgrastim-cbqv (UDENYCA) injection 6 mg, 6 mg, Subcutaneous, Once, 4 of 4 cycles Administration: 6 mg (08/09/2017), 6 mg (08/23/2017), 6 mg (09/06/2017), 6 mg (09/20/2017) cyclophosphamide (CYTOXAN) 1,300 mg in sodium chloride 0.9 % 250 mL chemo infusion, 600 mg/m2 = 1,300 mg, Intravenous,  Once, 4 of 4 cycles Administration: 1,300 mg (08/08/2017), 1,300 mg (08/22/2017), 1,300 mg (09/05/2017), 1,300 mg (09/19/2017) PACLitaxel (TAXOL) 174 mg in sodium chloride 0.9 % 250 mL chemo infusion (</= 80mg /m2), 80 mg/m2 = 174 mg, Intravenous,  Once, 4 of 12 cycles Dose modification: 72 mg/m2 (original dose 80 mg/m2,  Cycle 6, Reason: Dose not tolerated) Administration: 174 mg (10/03/2017), 156 mg (10/17/2017), 156 mg (10/24/2017), 156 mg (10/31/2017) fosaprepitant (EMEND) 150 mg, dexamethasone (DECADRON) 12 mg in sodium chloride 0.9 % 145  mL IVPB, , Intravenous,  Once, 4 of 4 cycles Administration:  (08/08/2017),  (08/22/2017),  (09/05/2017),  (09/19/2017)  for chemotherapy treatment.      ALLERGIES:  is allergic to sulfa antibiotics.  MEDICATIONS:  Current Outpatient Medications  Medication Sig Dispense Refill   acetaminophen (TYLENOL) 500 MG tablet Take 1,000 mg by mouth every 6 (six) hours as needed for moderate pain or headache.      ALPRAZolam (XANAX) 0.5 MG tablet Take 1 tablet (0.5 mg total) by mouth 2 (two) times daily. 60 tablet 0   amLODipine (NORVASC) 5 MG tablet Take 1 tablet (5 mg total) by mouth daily. 90 tablet 3   aspirin EC 81 MG tablet Take 81 mg by mouth at bedtime.      calcium carbonate (OS-CAL - DOSED IN MG OF ELEMENTAL CALCIUM) 1250 (500 Ca) MG tablet Take 1 tablet by mouth daily with breakfast.     chlorhexidine (PERIDEX) 0.12 % solution SMARTSIG:By Mouth     Cholecalciferol (VITAMIN D3) 125 MCG (5000 UT) CAPS Take 1 capsule by mouth 2 (two) times a day.     dexamethasone (DECADRON) 0.5 MG/5ML solution Swish 10 mLs (1 mg total) by mouth for 2 mins, then spit. Swish 4 (four) times daily. 500 mL 5   docusate sodium (COLACE) 100 MG capsule Take 100 mg by mouth 3 (three) times daily as needed for mild constipation.     everolimus (AFINITOR) 7.5 MG tablet Take 1 tablet (7.5 mg total) by mouth daily. 14 tablet 0   exemestane (AROMASIN) 25 MG tablet Take 1 tablet (25 mg total) by mouth daily after breakfast. 30 tablet 2   hydrochlorothiazide (HYDRODIURIL) 12.5 MG tablet TAKE 1 TABLET(12.5 MG) BY MOUTH DAILY 90 tablet 3   hydrocortisone 2.5 % lotion Apply topically 2 (two) times daily. Apply to area of rash. 118 mL 0   levothyroxine (SYNTHROID) 100 MCG tablet TAKE 1 TABLET BY MOUTH DAILY 90 tablet 3   nystatin-lidocaine-prednisoLONE-diphenhydrAMINE-distilled water-alum & mag hydroxide-simeth Swish and spit 5-35ml four times a day as needed 480 mL 3   olopatadine (PATANOL) 0.1 % ophthalmic solution Place 1 drop into  the right eye 2 (two) times daily. 5 mL 12   ondansetron (ZOFRAN) 8 MG tablet Take 1 tablet (8 mg total) by mouth every 8 (eight) hours as needed for nausea or vomiting. 20 tablet 2   pantoprazole (PROTONIX) 20 MG tablet TAKE 1 TABLET(20 MG) BY MOUTH DAILY 90 tablet 0   potassium chloride SA (KLOR-CON M) 20 MEQ tablet TAKE 1 TABLET BY MOUTH TWICE DAILY 60 tablet 3   predniSONE (DELTASONE) 10 MG tablet prednisone taper 40mg  x 2 days, 30mg  x 2 days, 20mg  x 2 days, 10mg  x 2 days, then stop 10 tablet 0   PREVIDENT 5000 PLUS 1.1 % CREA dental cream SMARTSIG:Application By Mouth     Probiotic Product (ALIGN PO) Take by mouth.     rosuvastatin (CRESTOR) 5 MG tablet Take 1 tablet (5 mg total) by mouth daily. 90 tablet 3   traMADol (ULTRAM) 50 MG tablet Take 1 tablet (50 mg total) by mouth every 6 (six) hours as needed. 60 tablet 0   traZODone (DESYREL) 50 MG tablet TAKE 1 TABLET BY MOUTH DAILY 30 tablet 6   triamcinolone cream (KENALOG) 0.1 % Apply  1 application. topically 2 (two) times daily. 30 g 0   No current facility-administered medications for this visit.    VITAL SIGNS: There were no vitals taken for this visit. There were no vitals filed for this visit.  Estimated body mass index is 33.57 kg/m as calculated from the following:   Height as of 05/31/22: 5\' 6"  (1.676 m).   Weight as of 05/31/22: 94.3 kg (208 lb).  LABS: CBC:    Component Value Date/Time   WBC 8.1 05/31/2022 1328   HGB 9.9 (L) 05/31/2022 1328   HGB 10.3 (L) 05/21/2022 1256   HGB 14.2 02/11/2011 1349   HCT 30.1 (L) 05/31/2022 1328   HCT 40.9 02/11/2011 1349   PLT 172 05/31/2022 1328   PLT 129 (L) 05/21/2022 1256   PLT 151 02/11/2011 1349   MCV 95.6 05/31/2022 1328   MCV 89 02/11/2011 1349   NEUTROABS 6.6 05/31/2022 1328   LYMPHSABS 0.3 (L) 05/31/2022 1328   MONOABS 0.8 05/31/2022 1328   EOSABS 0.0 05/31/2022 1328   BASOSABS 0.0 05/31/2022 1328   Comprehensive Metabolic Panel:    Component Value Date/Time   NA  137 05/31/2022 1328   NA 142 02/11/2011 1349   K 3.6 05/31/2022 1328   K 3.8 02/11/2011 1349   CL 99 05/31/2022 1328   CL 107 02/11/2011 1349   CO2 26 05/31/2022 1328   CO2 24 02/11/2011 1349   BUN 27 (H) 05/31/2022 1328   BUN 14 02/11/2011 1349   CREATININE 1.19 (H) 05/31/2022 1328   CREATININE 0.65 02/11/2011 1349   GLUCOSE 127 (H) 05/31/2022 1328   GLUCOSE 98 02/11/2011 1349   CALCIUM 9.4 05/31/2022 1328   CALCIUM 9.0 02/11/2011 1349   AST 35 05/31/2022 1328   AST 27 02/11/2011 1349   ALT 28 05/31/2022 1328   ALT 36 02/11/2011 1349   ALKPHOS 102 05/31/2022 1328   ALKPHOS 122 02/11/2011 1349   BILITOT 0.6 05/31/2022 1328   BILITOT 0.5 02/11/2011 1349   PROT 7.0 05/31/2022 1328   PROT 7.0 02/11/2011 1349   ALBUMIN 3.7 05/31/2022 1328   ALBUMIN 4.1 02/11/2011 1349     Present during today's visit: Patient only.   Assessment and Plan: Rash has cleared, patient is okay to resume everolimus at reduced dose of 7.5 mg. Note, patient has her grandson's wedding this weekend so she will wait to resume therapy until 6/3 or 6/4. Patient to start taking loratadine 10 mg daily and will continue to use prescription  hydrocortisone cream as needed She knows to call to report recurrent rash if occurring before next appointment. Diarrhea: likely related to location of recent radiation therapy. Patient rescheduled appointment on Tuesday due to uncontrolled diarrhea. Today she reports no diarrhea and one dose of loperamide, but feels dehydrated.  Patient added on to IV fluid clinic and CMP/Mag ordered  Patient encouraged to continue hydration at home    Oral Chemotherapy Side Effect/Intolerance:  See above  Oral Chemotherapy Adherence: N/A No patient barriers to medication adherence identified.   New medications: none reported  Medication Access Issues: no issues, pt fills at [facility]  Patient expressed understanding and was in agreement with this plan. She  also understands that She can call clinic at any time with any questions, concerns, or complaints.   Follow-up plan: Telephone visit next week, RTC in 2 weeks   Thank you for allowing me to participate in the care of this very pleasant patient.   Time Total:  15 mins  Visit consisted of counseling and education on dealing with issues of symptom management in the setting of serious and potentially life-threatening illness.Greater than 50%  of this time was spent counseling and coordinating care related to the above assessment and plan.  Signed by: Remi Haggard, PharmD, BCPS, BCOP, CPP Hematology/Oncology Clinical Pharmacist Practitioner [place]/DB/AP Oral Chemotherapy Navigation Clinic [phone]

## 2022-06-15 LAB — CANCER ANTIGEN 27.29: CA 27.29: 151.1 U/mL — ABNORMAL HIGH (ref 0.0–38.6)

## 2022-06-22 ENCOUNTER — Inpatient Hospital Stay: Payer: Medicare Other | Attending: Oncology | Admitting: Pharmacist

## 2022-06-22 DIAGNOSIS — Z79899 Other long term (current) drug therapy: Secondary | ICD-10-CM | POA: Insufficient documentation

## 2022-06-22 DIAGNOSIS — Z86711 Personal history of pulmonary embolism: Secondary | ICD-10-CM | POA: Insufficient documentation

## 2022-06-22 DIAGNOSIS — D649 Anemia, unspecified: Secondary | ICD-10-CM | POA: Insufficient documentation

## 2022-06-22 DIAGNOSIS — E876 Hypokalemia: Secondary | ICD-10-CM | POA: Insufficient documentation

## 2022-06-22 DIAGNOSIS — M8580 Other specified disorders of bone density and structure, unspecified site: Secondary | ICD-10-CM | POA: Insufficient documentation

## 2022-06-22 DIAGNOSIS — Z79811 Long term (current) use of aromatase inhibitors: Secondary | ICD-10-CM | POA: Insufficient documentation

## 2022-06-22 DIAGNOSIS — N289 Disorder of kidney and ureter, unspecified: Secondary | ICD-10-CM | POA: Insufficient documentation

## 2022-06-22 DIAGNOSIS — G629 Polyneuropathy, unspecified: Secondary | ICD-10-CM | POA: Insufficient documentation

## 2022-06-22 DIAGNOSIS — Z170 Estrogen receptor positive status [ER+]: Secondary | ICD-10-CM | POA: Insufficient documentation

## 2022-06-22 DIAGNOSIS — E860 Dehydration: Secondary | ICD-10-CM | POA: Insufficient documentation

## 2022-06-22 DIAGNOSIS — R197 Diarrhea, unspecified: Secondary | ICD-10-CM | POA: Insufficient documentation

## 2022-06-22 DIAGNOSIS — Z923 Personal history of irradiation: Secondary | ICD-10-CM | POA: Insufficient documentation

## 2022-06-22 DIAGNOSIS — C50912 Malignant neoplasm of unspecified site of left female breast: Secondary | ICD-10-CM | POA: Insufficient documentation

## 2022-06-22 DIAGNOSIS — C7951 Secondary malignant neoplasm of bone: Secondary | ICD-10-CM | POA: Insufficient documentation

## 2022-06-22 NOTE — Progress Notes (Signed)
Oral Chemotherapy Clinic- Telephone Visit [facility]  [phone] [phone]    I connected with Alicia [name] on 06/22/22 at  1:05 PM EDT by telephone and verified that I am speaking with the correct person using two identifiers.  Name of the patient: Alicia [name]  [id number]  January 16, 1950   Location: Patient: home Provider: home   I discussed the limitations, risks, security and privacy concerns of performing an evaluation and management service by telephone and the availability of in person appointments. I also discussed with the patient that there may be a patient responsible charge related to this service. The patient expressed understanding and agreed to proceed.  HPI: Patient is a 72 y.o. female with metastatic ER+ HER2- breast cancer. Previously treated with palbociclib, but treatment was changed to Afinitor (everolimus) and exemestane due to disease progression 03/2022. Everolimus 10 mg held on 05/22/22 due to potential drug rash, patient started on steroid taper. Rash cleared and patient resumed everolimus 10 mg on 05/31/22. Patient called to report rash return on 06/04/22. Treatment was again held and prescription for hydrocortisone 2.5% cream sent in. Patient resumed everolimus 7.5mg  on 06/18/22 along with loratidine.   Reason for Consult: Oral chemotherapy follow-up for everolimus therapy.   PAST MEDICAL HISTORY: Past Medical History:  Diagnosis Date   Anxiety    Breast cancer, left (HCC) 07/2017   Depression    Hypertension    Hypothyroidism    Personal history of chemotherapy 2019   LEFT mastectomy-chemo before   Personal history of radiation therapy 03/2018   LEFT mastectomy   Rapid heart rate    Thyroid disease     HEMATOLOGY/ONCOLOGY HISTORY:  Oncology History Overview Note  Patient is a 72 year old female who recently self palpated a mass on her left breast.  Subsequent imaging and biopsy revealed the above-stated breast  cancer.  Case was also discussed extensively at case conference.  Given the size and the stage of patient's malignancy, she will benefit from neoadjuvant chemotherapy using Adriamycin, Cytoxan, and Taxol.  Patient will also require Neulasta support.  Will get CT scan of the chest, abdomen, and pelvis to assess for any metastatic disease.  Patient will also require port placement and MUGA prior to initiating treatment  CT abdomen/pelvis/chest did not reveal any suspicious lesions concerning for metastatic disease. (08/01/17)  Port-A-Cath placed on 08/07/2017.  Cycle 1 day 1 of AC was given on 08/08/17.     Breast cancer, stage 2, left (HCC)  07/24/2017 Initial Diagnosis   Breast cancer, stage 2, left (HCC)   07/30/2017 Cancer Staging   Staging form: Breast, AJCC 8th Edition - Clinical stage from 07/30/2017: Stage IIA (cT2, cN1, cM0, G2, ER+, PR+, HER2-) - Signed by Jeralyn Ruths, MD on 07/30/2017   08/08/2017 - 10/31/2017 Chemotherapy   The patient had DOXOrubicin (ADRIAMYCIN) chemo injection 130 mg, 60 mg/m2 = 130 mg, Intravenous,  Once, 4 of 4 cycles Administration: 130 mg (08/08/2017), 130 mg (08/22/2017), 130 mg (09/05/2017), 130 mg (09/19/2017) palonosetron (ALOXI) injection 0.25 mg, 0.25 mg, Intravenous,  Once, 4 of 4 cycles Administration: 0.25 mg (08/08/2017), 0.25 mg (08/22/2017), 0.25 mg (09/05/2017), 0.25 mg (09/19/2017) pegfilgrastim-cbqv (UDENYCA) injection 6 mg, 6 mg, Subcutaneous, Once, 4 of 4 cycles Administration: 6 mg (08/09/2017), 6 mg (08/23/2017), 6 mg (09/06/2017), 6 mg (09/20/2017) cyclophosphamide (CYTOXAN) 1,300 mg in sodium chloride 0.9 % 250 mL chemo infusion, 600 mg/m2 = 1,300 mg, Intravenous,  Once, 4 of 4 cycles Administration: 1,300 mg (08/08/2017), 1,300 mg (  08/22/2017), 1,300 mg (09/05/2017), 1,300 mg (09/19/2017) PACLitaxel (TAXOL) 174 mg in sodium chloride 0.9 % 250 mL chemo infusion (</= 80mg /m2), 80 mg/m2 = 174 mg, Intravenous,  Once, 4 of 12 cycles Dose modification: 72 mg/m2  (original dose 80 mg/m2, Cycle 6, Reason: Dose not tolerated) Administration: 174 mg (10/03/2017), 156 mg (10/17/2017), 156 mg (10/24/2017), 156 mg (10/31/2017) fosaprepitant (EMEND) 150 mg, dexamethasone (DECADRON) 12 mg in sodium chloride 0.9 % 145 mL IVPB, , Intravenous,  Once, 4 of 4 cycles Administration:  (08/08/2017),  (08/22/2017),  (09/05/2017),  (09/19/2017)  for chemotherapy treatment.      ALLERGIES:  is allergic to sulfa antibiotics.  MEDICATIONS:  Current Outpatient Medications  Medication Sig Dispense Refill   acetaminophen (TYLENOL) 500 MG tablet Take 1,000 mg by mouth every 6 (six) hours as needed for moderate pain or headache.      ALPRAZolam (XANAX) 0.5 MG tablet Take 1 tablet (0.5 mg total) by mouth 2 (two) times daily. 60 tablet 0   amLODipine (NORVASC) 5 MG tablet Take 1 tablet (5 mg total) by mouth daily. 90 tablet 3   aspirin EC 81 MG tablet Take 81 mg by mouth at bedtime.      calcium carbonate (OS-CAL - DOSED IN MG OF ELEMENTAL CALCIUM) 1250 (500 Ca) MG tablet Take 1 tablet by mouth daily with breakfast.     chlorhexidine (PERIDEX) 0.12 % solution SMARTSIG:By Mouth     Cholecalciferol (VITAMIN D3) 125 MCG (5000 UT) CAPS Take 1 capsule by mouth 2 (two) times a day.     dexamethasone (DECADRON) 0.5 MG/5ML solution Swish 10 mLs (1 mg total) by mouth for 2 mins, then spit. Swish 4 (four) times daily. 500 mL 5   docusate sodium (COLACE) 100 MG capsule Take 100 mg by mouth 3 (three) times daily as needed for mild constipation.     everolimus (AFINITOR) 7.5 MG tablet Take 1 tablet (7.5 mg total) by mouth daily. 14 tablet 0   exemestane (AROMASIN) 25 MG tablet Take 1 tablet (25 mg total) by mouth daily after breakfast. 30 tablet 2   hydrochlorothiazide (HYDRODIURIL) 12.5 MG tablet TAKE 1 TABLET(12.5 MG) BY MOUTH DAILY 90 tablet 3   hydrocortisone 2.5 % lotion Apply topically 2 (two) times daily. Apply to area of rash. 118 mL 0   levothyroxine (SYNTHROID) 100 MCG tablet TAKE 1  TABLET BY MOUTH DAILY 90 tablet 3   nystatin-lidocaine-prednisoLONE-diphenhydrAMINE-distilled water-alum & mag hydroxide-simeth Swish and spit 5-24ml four times a day as needed 480 mL 3   olopatadine (PATANOL) 0.1 % ophthalmic solution Place 1 drop into the right eye 2 (two) times daily. 5 mL 12   ondansetron (ZOFRAN) 8 MG tablet Take 1 tablet (8 mg total) by mouth every 8 (eight) hours as needed for nausea or vomiting. 20 tablet 2   pantoprazole (PROTONIX) 20 MG tablet TAKE 1 TABLET(20 MG) BY MOUTH DAILY 90 tablet 0   potassium chloride SA (KLOR-CON M) 20 MEQ tablet TAKE 1 TABLET BY MOUTH TWICE DAILY 60 tablet 3   predniSONE (DELTASONE) 10 MG tablet prednisone taper 40mg  x 2 days, 30mg  x 2 days, 20mg  x 2 days, 10mg  x 2 days, then stop 10 tablet 0   PREVIDENT 5000 PLUS 1.1 % CREA dental cream SMARTSIG:Application By Mouth     Probiotic Product (ALIGN PO) Take by mouth.     rosuvastatin (CRESTOR) 5 MG tablet Take 1 tablet (5 mg total) by mouth daily. 90 tablet 3   traMADol (ULTRAM) 50 MG  tablet Take 1 tablet (50 mg total) by mouth every 6 (six) hours as needed. 60 tablet 0   traZODone (DESYREL) 50 MG tablet TAKE 1 TABLET BY MOUTH DAILY 30 tablet 6   triamcinolone cream (KENALOG) 0.1 % Apply 1 application. topically 2 (two) times daily. 30 g 0   No current facility-administered medications for this visit.    VITAL SIGNS: There were no vitals taken for this visit. There were no vitals filed for this visit.  Estimated body mass index is 33.57 kg/m as calculated from the following:   Height as of 05/31/22: 5\' 6"  (1.676 m).   Weight as of 05/31/22: 94.3 kg (208 lb).  LABS: CBC:    Component Value Date/Time   WBC 4.3 06/14/2022 1400   HGB 10.1 (L) 06/14/2022 1400   HGB 10.3 (L) 05/21/2022 1256   HGB 14.2 02/11/2011 1349   HCT 31.0 (L) 06/14/2022 1400   HCT 40.9 02/11/2011 1349   PLT 156 06/14/2022 1400   PLT 129 (L) 05/21/2022 1256   PLT 151 02/11/2011 1349   MCV 92.5 06/14/2022 1400    MCV 89 02/11/2011 1349   NEUTROABS 3.1 06/14/2022 1400   LYMPHSABS 0.2 (L) 06/14/2022 1400   MONOABS 0.6 06/14/2022 1400   EOSABS 0.3 06/14/2022 1400   BASOSABS 0.0 06/14/2022 1400   Comprehensive Metabolic Panel:    Component Value Date/Time   NA 133 (L) 06/14/2022 1400   NA 142 02/11/2011 1349   K 3.2 (L) 06/14/2022 1400   K 3.8 02/11/2011 1349   CL 94 (L) 06/14/2022 1400   CL 107 02/11/2011 1349   CO2 24 06/14/2022 1400   CO2 24 02/11/2011 1349   BUN 14 06/14/2022 1400   BUN 14 02/11/2011 1349   CREATININE 1.37 (H) 06/14/2022 1400   CREATININE 0.65 02/11/2011 1349   GLUCOSE 105 (H) 06/14/2022 1400   GLUCOSE 98 02/11/2011 1349   CALCIUM 9.2 06/14/2022 1400   CALCIUM 9.0 02/11/2011 1349   AST 32 06/14/2022 1400   ALT 21 06/14/2022 1400   ALT 36 02/11/2011 1349   ALKPHOS 108 06/14/2022 1400   ALKPHOS 122 02/11/2011 1349   BILITOT 0.9 06/14/2022 1400   PROT 7.1 06/14/2022 1400   PROT 7.0 02/11/2011 1349   ALBUMIN 3.5 06/14/2022 1400   ALBUMIN 4.1 02/11/2011 1349    On phone during today's visit: patient only  Assessment and Plan-  Continue everolimus 7.5mg  daily Patient resumed everolimus on 06/18/22 Patient taking loratidine 10mg  daily to help decreasethe risk of rash Patient reports her rash has not returned since restarting treatment Diarrhea: patient having occasional diarrhea, but is able to manage with prn loperamide.    Medication Access Issues: refill rx will be sent in next week following her in office appt  Patient expressed understanding and was in agreement with this plan. She also understands that She can call clinic at any time with any questions, concerns, or complaints.   Follow-up plan: RTC next week  Thank you for allowing me to participate in the care of this very pleasant patient.   Time Total: 10 min of non face-to-face telephone visit time during this encounter  Visit consisted of counseling and education on dealing with issues of symptom  management in the setting of serious and potentially life-threatening illness.Greater than 50%  of this time was spent counseling and coordinating care related to the above assessment and plan.   Remi Haggard, PharmD, BCPS, BCOP, CPP Hematology/Oncology Clinical Pharmacist Practitioner Arist[facility]/DB/AP Oral Chemotherapy  [facility] [phone]  06/22/2022 1:27 PM

## 2022-06-25 ENCOUNTER — Other Ambulatory Visit: Payer: Self-pay | Admitting: Oncology

## 2022-06-25 NOTE — Telephone Encounter (Signed)
  Component Ref Range & Units 11 d ago 3 wk ago 1 mo ago 2 mo ago 3 mo ago 4 mo ago 5 mo ago  Potassium 3.5 - 5.1 mmol/L 3.2 Low  3.6 2.7 Low Panic  CM 3.7 3.6 3.7 3.5

## 2022-06-26 ENCOUNTER — Other Ambulatory Visit (HOSPITAL_COMMUNITY): Payer: Self-pay

## 2022-06-27 ENCOUNTER — Other Ambulatory Visit: Payer: Self-pay | Admitting: *Deleted

## 2022-06-27 DIAGNOSIS — C50912 Malignant neoplasm of unspecified site of left female breast: Secondary | ICD-10-CM

## 2022-06-28 ENCOUNTER — Inpatient Hospital Stay: Payer: Medicare Other | Admitting: Pharmacist

## 2022-06-28 ENCOUNTER — Other Ambulatory Visit: Payer: Self-pay

## 2022-06-28 ENCOUNTER — Other Ambulatory Visit (HOSPITAL_COMMUNITY): Payer: Self-pay

## 2022-06-28 ENCOUNTER — Encounter: Payer: Self-pay | Admitting: Oncology

## 2022-06-28 ENCOUNTER — Inpatient Hospital Stay (HOSPITAL_BASED_OUTPATIENT_CLINIC_OR_DEPARTMENT_OTHER): Payer: Medicare Other | Admitting: Oncology

## 2022-06-28 ENCOUNTER — Inpatient Hospital Stay: Payer: Medicare Other

## 2022-06-28 VITALS — BP 117/65 | HR 109 | Temp 97.6°F | Resp 18 | Wt 195.5 lb

## 2022-06-28 DIAGNOSIS — G629 Polyneuropathy, unspecified: Secondary | ICD-10-CM | POA: Diagnosis not present

## 2022-06-28 DIAGNOSIS — R197 Diarrhea, unspecified: Secondary | ICD-10-CM

## 2022-06-28 DIAGNOSIS — Z86711 Personal history of pulmonary embolism: Secondary | ICD-10-CM | POA: Diagnosis not present

## 2022-06-28 DIAGNOSIS — C50912 Malignant neoplasm of unspecified site of left female breast: Secondary | ICD-10-CM

## 2022-06-28 DIAGNOSIS — Z79899 Other long term (current) drug therapy: Secondary | ICD-10-CM | POA: Diagnosis not present

## 2022-06-28 DIAGNOSIS — C50919 Malignant neoplasm of unspecified site of unspecified female breast: Secondary | ICD-10-CM

## 2022-06-28 DIAGNOSIS — D649 Anemia, unspecified: Secondary | ICD-10-CM | POA: Diagnosis not present

## 2022-06-28 DIAGNOSIS — Z923 Personal history of irradiation: Secondary | ICD-10-CM | POA: Diagnosis not present

## 2022-06-28 DIAGNOSIS — Z170 Estrogen receptor positive status [ER+]: Secondary | ICD-10-CM | POA: Diagnosis not present

## 2022-06-28 DIAGNOSIS — N289 Disorder of kidney and ureter, unspecified: Secondary | ICD-10-CM | POA: Diagnosis not present

## 2022-06-28 DIAGNOSIS — C7951 Secondary malignant neoplasm of bone: Secondary | ICD-10-CM | POA: Diagnosis not present

## 2022-06-28 DIAGNOSIS — E860 Dehydration: Secondary | ICD-10-CM | POA: Diagnosis not present

## 2022-06-28 DIAGNOSIS — Z79811 Long term (current) use of aromatase inhibitors: Secondary | ICD-10-CM | POA: Diagnosis not present

## 2022-06-28 DIAGNOSIS — E876 Hypokalemia: Secondary | ICD-10-CM | POA: Diagnosis not present

## 2022-06-28 DIAGNOSIS — M8580 Other specified disorders of bone density and structure, unspecified site: Secondary | ICD-10-CM | POA: Diagnosis not present

## 2022-06-28 LAB — CBC WITH DIFFERENTIAL/PLATELET
Abs Immature Granulocytes: 0.05 10*3/uL (ref 0.00–0.07)
Basophils Absolute: 0 10*3/uL (ref 0.0–0.1)
Basophils Relative: 0 %
Eosinophils Absolute: 0.1 10*3/uL (ref 0.0–0.5)
Eosinophils Relative: 2 %
HCT: 33.7 % — ABNORMAL LOW (ref 36.0–46.0)
Hemoglobin: 10.9 g/dL — ABNORMAL LOW (ref 12.0–15.0)
Immature Granulocytes: 1 %
Lymphocytes Relative: 4 %
Lymphs Abs: 0.2 10*3/uL — ABNORMAL LOW (ref 0.7–4.0)
MCH: 28.8 pg (ref 26.0–34.0)
MCHC: 32.3 g/dL (ref 30.0–36.0)
MCV: 89.2 fL (ref 80.0–100.0)
Monocytes Absolute: 0.4 10*3/uL (ref 0.1–1.0)
Monocytes Relative: 8 %
Neutro Abs: 3.8 10*3/uL (ref 1.7–7.7)
Neutrophils Relative %: 85 %
Platelets: 150 10*3/uL (ref 150–400)
RBC: 3.78 MIL/uL — ABNORMAL LOW (ref 3.87–5.11)
RDW: 16.6 % — ABNORMAL HIGH (ref 11.5–15.5)
WBC: 4.5 10*3/uL (ref 4.0–10.5)
nRBC: 0 % (ref 0.0–0.2)

## 2022-06-28 LAB — COMPREHENSIVE METABOLIC PANEL
ALT: 23 U/L (ref 0–44)
AST: 41 U/L (ref 15–41)
Albumin: 3.6 g/dL (ref 3.5–5.0)
Alkaline Phosphatase: 95 U/L (ref 38–126)
Anion gap: 13 (ref 5–15)
BUN: 13 mg/dL (ref 8–23)
CO2: 24 mmol/L (ref 22–32)
Calcium: 9.1 mg/dL (ref 8.9–10.3)
Chloride: 97 mmol/L — ABNORMAL LOW (ref 98–111)
Creatinine, Ser: 1.4 mg/dL — ABNORMAL HIGH (ref 0.44–1.00)
GFR, Estimated: 40 mL/min — ABNORMAL LOW (ref >60)
Glucose, Bld: 141 mg/dL — ABNORMAL HIGH (ref 70–99)
Potassium: 3.1 mmol/L — ABNORMAL LOW (ref 3.5–5.1)
Sodium: 134 mmol/L — ABNORMAL LOW (ref 135–145)
Total Bilirubin: 0.6 mg/dL (ref 0.3–1.2)
Total Protein: 7.3 g/dL (ref 6.5–8.1)

## 2022-06-28 LAB — MAGNESIUM: Magnesium: 1.6 mg/dL — ABNORMAL LOW (ref 1.7–2.4)

## 2022-06-28 MED ORDER — EVEROLIMUS 7.5 MG PO TABS
7.5000 mg | ORAL_TABLET | Freq: Every day | ORAL | 1 refills | Status: DC
Start: 2022-06-28 — End: 2022-06-28
  Filled 2022-06-28: qty 28, 28d supply, fill #0

## 2022-06-28 MED ORDER — EVEROLIMUS 7.5 MG PO TABS
7.5000 mg | ORAL_TABLET | Freq: Every day | ORAL | 1 refills | Status: DC
Start: 2022-06-28 — End: 2022-08-01
  Filled 2022-06-28: qty 30, 30d supply, fill #0
  Filled 2022-07-13: qty 30, 30d supply, fill #1

## 2022-06-28 MED ORDER — SODIUM CHLORIDE 0.9 % IV SOLN
Freq: Once | INTRAVENOUS | Status: AC
  Filled 2022-06-28: qty 250

## 2022-06-28 MED ORDER — EXEMESTANE 25 MG PO TABS
25.0000 mg | ORAL_TABLET | Freq: Every day | ORAL | 2 refills | Status: DC
Start: 2022-06-28 — End: 2022-08-01
  Filled 2022-06-28 (×2): qty 30, 30d supply, fill #0
  Filled 2022-07-13: qty 30, 30d supply, fill #1

## 2022-06-28 NOTE — Progress Notes (Signed)
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  INTERVAL HISTORY: Patient returns to clinic today for repeat laboratory work and further evaluation.  Her rash has now resolved since she started taking Claritin along with her Afinitor.  She has completed XRT to her pelvis, but continues to have residual diarrhea.  She has a poor appetite.  Her jaw pain has resolved.  She continues to have a mild peripheral neuropathy, but no other neurologic complaints.  She denies any chest pain, shortness of breath, cough, or hemoptysis.  She denies any nausea, vomiting, or constipation.  She has no urinary complaints.  Patient offers no further specific complaints today.  REVIEW OF SYSTEMS:   Review of Systems  Constitutional: Negative.  Negative for fever, malaise/fatigue and weight loss.  HENT:  Negative for congestion.   Respiratory: Negative.  Negative for cough and shortness of breath.   Cardiovascular: Negative.  Negative for chest pain and leg swelling.  Gastrointestinal: Negative.  Negative for abdominal pain, constipation, diarrhea and nausea.  Genitourinary: Negative.  Negative for dysuria, flank pain and urgency.  Musculoskeletal: Negative.  Negative for back pain and joint pain.  Skin: Negative.  Negative for rash.  Neurological:  Positive for tingling and sensory change. Negative for dizziness, focal weakness, weakness and headaches.   Psychiatric/Behavioral:  The patient is not nervous/anxious.     As per HPI. Otherwise, a complete review of systems is negative.  PAST MEDICAL HISTORY: Past Medical History:  Diagnosis Date   Anxiety    Breast cancer, left (HCC) 07/2017   Depression    Hypertension    Hypothyroidism    Personal history of chemotherapy 2019   LEFT mastectomy-chemo before   Personal history of radiation therapy 03/2018   LEFT mastectomy   Rapid heart rate    Thyroid disease     PAST SURGICAL HISTORY: Past Surgical History:  Procedure Laterality Date   AXILLARY LYMPH NODE BIOPSY Left 07/16/2017   METASTATIC MAMMARY CARCINOMA   BREAST BIOPSY Left 07/16/2017   [place] bx of left breast mass and left breast LN.  INVASIVE MAMMARY CARCINOMA, NO SPECIAL TYPE.    BREAST EXCISIONAL BIOPSY Right 2001   benign   BREAST LUMPECTOMY WITH SENTINEL LYMPH NODE BIOPSY Left 12/06/2017   Procedure: BREAST LUMPECTOMY WITH SENTINEL LYMPH NODE BX;  Surgeon: Earline Mayotte, MD;  Location: [facility] ORS;  Service: General;  Laterality: Left;   COLONOSCOPY     MASTECTOMY Left 12/23/2017   PORTACATH PLACEMENT Right 08/07/2017   Procedure: INSERTION PORT-A-CATH;  Surgeon: Earline Mayotte, MD;  Location: [facility] ORS;  Service: General;  Laterality: Right;   SIMPLE MASTECTOMY WITH AXILLARY SENTINEL NODE BIOPSY Left 12/23/2017   T2,N2 with 6/7 nodes positive. Whole breast radiation.  Surgeon: Earline Mayotte, MD;  Location: [facility] OR[facility];  Service: General;  Laterality: Left;   TONSILLECTOMY      FAMILY HISTORY: Family History  Problem Relation Age of Onset   Stroke Mother    Thyroid disease Mother    Renal Disease Mother  Stroke Father    Heart attack Father    Sudden death Father 58       suicide   Anuerysm Brother    Breast cancer Neg Hx     ADVANCED DIRECTIVES (Y/N):  N  HEALTH MAINTENANCE: Social History   Tobacco Use   Smoking status: Former    Packs/day: 1.00    Years: 30.00    Additional pack years:  0.00    Total pack years: 30.00    Types: Cigarettes    Quit date: 2019    Years since quitting: 5.4   Smokeless tobacco: Never  Vaping Use   Vaping Use: Never used  Substance Use Topics   Alcohol use: Not Currently   Drug use: Never     Colonoscopy:  PAP:  Bone density:  Lipid panel:  Allergies  Allergen Reactions   Sulfa Antibiotics Diarrhea    Current Outpatient Medications  Medication Sig Dispense Refill   acetaminophen (TYLENOL) 500 MG tablet Take 1,000 mg by mouth every 6 (six) hours as needed for moderate pain or headache.      ALPRAZolam (XANAX) 0.5 MG tablet Take 1 tablet (0.5 mg total) by mouth 2 (two) times daily. 60 tablet 0   amLODipine (NORVASC) 5 MG tablet Take 1 tablet (5 mg total) by mouth daily. 90 tablet 3   aspirin EC 81 MG tablet Take 81 mg by mouth at bedtime.      calcium carbonate (OS-CAL - DOSED IN MG OF ELEMENTAL CALCIUM) 1250 (500 Ca) MG tablet Take 1 tablet by mouth daily with breakfast.     chlorhexidine (PERIDEX) 0.12 % solution SMARTSIG:By Mouth     Cholecalciferol (VITAMIN D3) 125 MCG (5000 UT) CAPS Take 1 capsule by mouth 2 (two) times a day.     dexamethasone (DECADRON) 0.5 MG/5ML solution Swish 10 mLs (1 mg total) by mouth for 2 mins, then spit. Swish 4 (four) times daily. 500 mL 5   docusate sodium (COLACE) 100 MG capsule Take 100 mg by mouth 3 (three) times daily as needed for mild constipation.     hydrochlorothiazide (HYDRODIURIL) 12.5 MG tablet TAKE 1 TABLET(12.5 MG) BY MOUTH DAILY 90 tablet 3   hydrocortisone 2.5 % lotion Apply topically 2 (two) times daily. Apply to area of rash. 118 mL 0   levothyroxine (SYNTHROID) 100 MCG tablet TAKE 1 TABLET BY MOUTH DAILY 90 tablet 3   loratadine (CLARITIN) 10 MG tablet Take 10 mg by mouth daily. Take to reduce risk of rash from everolimus.     nystatin-lidocaine-prednisoLONE-diphenhydrAMINE-distilled water-alum & mag hydroxide-simeth Swish and spit 5-92ml four times a day as needed 480 mL 3    olopatadine (PATANOL) 0.1 % ophthalmic solution Place 1 drop into the right eye 2 (two) times daily. 5 mL 12   ondansetron (ZOFRAN) 8 MG tablet Take 1 tablet (8 mg total) by mouth every 8 (eight) hours as needed for nausea or vomiting. 20 tablet 2   pantoprazole (PROTONIX) 20 MG tablet TAKE 1 TABLET(20 MG) BY MOUTH DAILY 90 tablet 0   potassium chloride SA (KLOR-CON M) 20 MEQ tablet TAKE 1 TABLET BY MOUTH TWICE DAILY 60 tablet 3   predniSONE (DELTASONE) 10 MG tablet prednisone taper 40mg  x 2 days, 30mg  x 2 days, 20mg  x 2 days, 10mg  x 2 days, then stop 10 tablet 0   PREVIDENT 5000 PLUS 1.1 % CREA dental cream SMARTSIG:Application By Mouth     Probiotic Product (ALIGN PO) Take by mouth.  rosuvastatin (CRESTOR) 5 MG tablet Take 1 tablet (5 mg total) by mouth daily. 90 tablet 3   traMADol (ULTRAM) 50 MG tablet Take 1 tablet (50 mg total) by mouth every 6 (six) hours as needed. 60 tablet 0   traZODone (DESYREL) 50 MG tablet TAKE 1 TABLET BY MOUTH DAILY 30 tablet 6   triamcinolone cream (KENALOG) 0.1 % Apply 1 application. topically 2 (two) times daily. 30 g 0   everolimus (AFINITOR) 7.5 MG tablet Take 1 tablet (7.5 mg total) by mouth daily. 28 tablet 1   exemestane (AROMASIN) 25 MG tablet Take 1 tablet (25 mg total) by mouth daily after breakfast. 30 tablet 2   No current facility-administered medications for this visit.    OBJECTIVE: Vitals:   06/28/22 1016  BP: 117/65  Pulse: (!) 109  Resp: 18  Temp: 97.6 F (36.4 C)  SpO2: 94%     Body mass index is 31.55 kg/m.    ECOG FS:1 - Symptomatic but completely ambulatory  General: Well-developed, well-nourished, no acute distress.  Sitting in wheelchair. Eyes: Pink conjunctiva, anicteric sclera. HEENT: Normocephalic, moist mucous membranes.  Oropharynx is clear without erythema or exudate. Lungs: No audible wheezing or coughing. Heart: Regular rate and rhythm. Abdomen: Soft, nontender, no obvious distention. Musculoskeletal: No edema,  cyanosis, or clubbing. Neuro: Alert, answering all questions appropriately. Cranial nerves grossly intact. Skin: No rashes or petechiae noted. Psych: Normal affect.  LAB RESULTS:  Lab Results  Component Value Date   NA 134 (L) 06/28/2022   K 3.1 (L) 06/28/2022   CL 97 (L) 06/28/2022   CO2 24 06/28/2022   GLUCOSE 141 (H) 06/28/2022   BUN 13 06/28/2022   CREATININE 1.40 (H) 06/28/2022   CALCIUM 9.1 06/28/2022   PROT 7.3 06/28/2022   ALBUMIN 3.6 06/28/2022   AST 41 06/28/2022   ALT 23 06/28/2022   ALKPHOS 95 06/28/2022   BILITOT 0.6 06/28/2022   GFRNONAA 40 (L) 06/28/2022   GFRAA 57 (L) 10/01/2019    Lab Results  Component Value Date   WBC 4.5 06/28/2022   NEUTROABS 3.8 06/28/2022   HGB 10.9 (L) 06/28/2022   HCT 33.7 (L) 06/28/2022   MCV 89.2 06/28/2022   PLT 150 06/28/2022     STUDIES: No results found.   ONCOLOGY HISTORY: Patient initially received neoadjuvant chemotherapy with Adriamycin and Cytoxan followed by weekly Taxol. She only received 4 cycles of weekly Taxol prior to discontinuation of treatment on October 31, 2017 secondary to persistent peripheral neuropathy.  She ultimately required mastectomy and final pathology noted 5 of 6 lymph nodes positive for disease.  Patient completed adjuvant XRT in mid April 2020.  Pain initiated letrozole in May 2020, this was discontinued secondary to side effects and patient was started on anastrozole.  Nuclear medicine bone scan on June 19, 2019 revealed metastatic disease and anastrozole was subsequently discontinued.  PET scan results from October 02, 2021 revealed no clear evidence of metastatic progression.  PET scan results from June 30, 2019 reviewed independently with metastatic bony disease, but no obvious evidence of visceral disease.  MRI of the brain on August 24, 2019 reviewed independently with no obvious evidence of metastatic disease.   ASSESSMENT: Recurrent stage IV ER/PR positive, HER-2 negative invasive  carcinoma with bony metastasis.  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  Patient CA 27-29 has been slowly increasing and her most recent result is 155.9.  PET  scan results from March 29, 2022 reviewed independently with what appears to be progression of disease in her bones.  She continues to have no visceral disease.  After lengthy discussion, it was agreed upon that patient will switch treatments to Afinitor 10 mg daily along with Aromasin 25 mg daily.  Ibrance and Faslodex have been discontinued.  [name] has been permanently discontinued given her issues with her mandible.  Continue treatment as planned with Claritin.  Return to clinic in 4 weeks for repeat laboratory work and further evaluation.  Prescient clinical pharmacy input. Diarrhea: Possibly secondary to recent pelvic XRT, but could be related to Afinitor.  Patient has been recommended to take 1 Imodium daily. Poor appetite/dehydration: Patient was given a referral to dietary and agreed to have 1 L of IV fluids today. Peripheral neuropathy: Chronic and unchanged. History of pulmonary embolus: Patient was diagnosed with a small pulmonary embolus on January 10, 2018.  She is no longer on anticoagulation.  There was no evidence of recurrent PE on CT scan from October 27, 2019. Osteopenia: Patient's most recent bone mineral density on March 03, 2019 reported a T score of -1.8 which is only mildly decreased from 1 year prior when the reported T score was -1.6.  Continue calcium and vitamin D supplementation.  Patient no longer will receive bisphosphonates.   Left hip/flank pain: Patient does not complain of pain today.  Continue tramadol as prescribed.  She has now completed XRT.   Leukopenia: Resolved.  Anemia: Hemoglobin mildly improved to 10.9. Renal insufficiency: Creatinine trended up slightly to 1.40.  IV fluids as above. Hypokalemia: Potassium  decreased to 3.1 likely secondary to diarrhea.  Continue oral potassium supplementation. Dental pathology: Significantly improved.  Continue follow up with Dr. Willaim Bane.  Patient has not doing hyperbaric treatments at [name] at this point.   Transaminitis: Resolved.   Patient expressed understanding and was in agreement with this plan. She also understands that She can call clinic at any time with any questions, concerns, or complaints.    Cancer Staging  Breast cancer, stage 2, left (HCC) Staging form: Breast, AJCC 8th Edition - Clinical stage from 07/30/2017: Stage IIA (cT2, cN1, cM0, G2, ER+, PR+, HER2-) - Signed by Jeralyn Ruths, MD on 07/30/2017 Histologic grading system: 3 grade system Laterality: Left   Jeralyn Ruths, MD   06/28/2022 11:27 AM

## 2022-06-28 NOTE — Progress Notes (Signed)
Oral Chemotherapy Clinic [facility]  [phone] [phone]  [name], [name], [name], [name] Jim Like, RN as Registered Nurse Orlie Dakin Tollie Pizza, MD as Consulting Physician (Oncology) Carmina Miller, MD as Referring Physician (Radiation Oncology) Chriss Driver, RN as Registered Nurse   Name of the patient: Alicia [name]  [id number]  09-12-1950   Date of visit: 06/28/22  HPI: Patient is a 72 y.o. female with metastatic ER+ HER2- breast cancer. Previously treated with palbociclib, but treatment was changed to Afinitor (everolimus) and exemestane due to disease progression 03/2022. Everolimus 10 mg held on 05/22/22 due to potential drug rash, patient started on steroid taper. Rash cleared and patient resumed everolimus 10 mg on 05/31/22. Patient called to report rash return on 06/04/22. Treatment was again held and prescription for hydrocortisone 2.5% cream sent in. Patient resumed everolimus 7.5mg  on 06/18/22 along with loratidine.   Reason for Consult: Oral chemotherapy follow-up for everolimus and exemestane therapy.   PAST MEDICAL HISTORY: Past Medical History:  Diagnosis Date   Anxiety    Breast cancer, left (HCC) 07/2017   Depression    Hypertension    Hypothyroidism    Personal history of chemotherapy 2019   LEFT mastectomy-chemo before   Personal history of radiation therapy 03/2018   LEFT mastectomy   Rapid heart rate    Thyroid disease     HEMATOLOGY/ONCOLOGY HISTORY:  Oncology History Overview Note  Patient is a 72 year old female who recently self palpated a mass on her left breast.  Subsequent imaging and biopsy revealed the above-stated breast cancer.  Case was also discussed extensively at case conference.  Given the size and the stage of patient's malignancy, she will benefit from neoadjuvant chemotherapy using  Adriamycin, Cytoxan, and Taxol.  Patient will also require Neulasta support.  Will get CT scan of the chest, abdomen, and pelvis to assess for any metastatic disease.  Patient will also require port placement and MUGA prior to initiating treatment  CT abdomen/pelvis/chest did not reveal any suspicious lesions concerning for metastatic disease. (08/01/17)  Port-A-Cath placed on 08/07/2017.  Cycle 1 day 1 of AC was given on 08/08/17.     Breast cancer, stage 2, left (HCC)  07/24/2017 Initial Diagnosis   Breast cancer, stage 2, left (HCC)   07/30/2017 Cancer Staging   Staging form: Breast, AJCC 8th Edition - Clinical stage from 07/30/2017: Stage IIA (cT2, cN1, cM0, G2, ER+, PR+, HER2-) - Signed by Jeralyn Ruths, MD on 07/30/2017   08/08/2017 - 10/31/2017 Chemotherapy   The patient had DOXOrubicin (ADRIAMYCIN) chemo injection 130 mg, 60 mg/m2 = 130 mg, Intravenous,  Once, 4 of 4 cycles Administration: 130 mg (08/08/2017), 130 mg (08/22/2017), 130 mg (09/05/2017), 130 mg (09/19/2017) palonosetron (ALOXI) injection 0.25 mg, 0.25 mg, Intravenous,  Once, 4 of 4 cycles Administration: 0.25 mg (08/08/2017), 0.25 mg (08/22/2017), 0.25 mg (09/05/2017), 0.25 mg (09/19/2017) pegfilgrastim-cbqv (UDENYCA) injection 6 mg, 6 mg, Subcutaneous, Once, 4 of 4 cycles Administration: 6 mg (08/09/2017), 6 mg (08/23/2017), 6 mg (09/06/2017), 6 mg (09/20/2017) cyclophosphamide (CYTOXAN) 1,300 mg in sodium chloride 0.9 % 250 mL chemo infusion, 600 mg/m2 = 1,300 mg, Intravenous,  Once, 4 of 4 cycles Administration: 1,300 mg (08/08/2017), 1,300 mg (08/22/2017), 1,300 mg (09/05/2017), 1,300 mg (09/19/2017) PACLitaxel (TAXOL) 174 mg in sodium chloride 0.9 % 250 mL chemo infusion (</= 80mg /m2), 80 mg/m2 = 174 mg, Intravenous,  Once, 4 of 12  cycles Dose modification: 72 mg/m2 (original dose 80 mg/m2, Cycle 6, Reason: Dose not tolerated) Administration: 174 mg (10/03/2017), 156 mg (10/17/2017), 156 mg (10/24/2017), 156 mg (10/31/2017) fosaprepitant  (EMEND) 150 mg, dexamethasone (DECADRON) 12 mg in sodium chloride 0.9 % 145 mL IVPB, , Intravenous,  Once, 4 of 4 cycles Administration:  (08/08/2017),  (08/22/2017),  (09/05/2017),  (09/19/2017)  for chemotherapy treatment.      ALLERGIES:  is allergic to sulfa antibiotics.  MEDICATIONS:  Current Outpatient Medications  Medication Sig Dispense Refill   acetaminophen (TYLENOL) 500 MG tablet Take 1,000 mg by mouth every 6 (six) hours as needed for moderate pain or headache.      ALPRAZolam (XANAX) 0.5 MG tablet Take 1 tablet (0.5 mg total) by mouth 2 (two) times daily. 60 tablet 0   amLODipine (NORVASC) 5 MG tablet Take 1 tablet (5 mg total) by mouth daily. 90 tablet 3   aspirin EC 81 MG tablet Take 81 mg by mouth at bedtime.      calcium carbonate (OS-CAL - DOSED IN MG OF ELEMENTAL CALCIUM) 1250 (500 Ca) MG tablet Take 1 tablet by mouth daily with breakfast.     chlorhexidine (PERIDEX) 0.12 % solution SMARTSIG:By Mouth     Cholecalciferol (VITAMIN D3) 125 MCG (5000 UT) CAPS Take 1 capsule by mouth 2 (two) times a day.     dexamethasone (DECADRON) 0.5 MG/5ML solution Swish 10 mLs (1 mg total) by mouth for 2 mins, then spit. Swish 4 (four) times daily. 500 mL 5   docusate sodium (COLACE) 100 MG capsule Take 100 mg by mouth 3 (three) times daily as needed for mild constipation.     everolimus (AFINITOR) 7.5 MG tablet Take 1 tablet (7.5 mg total) by mouth daily. 28 tablet 1   exemestane (AROMASIN) 25 MG tablet Take 1 tablet (25 mg total) by mouth daily after breakfast. 30 tablet 2   hydrochlorothiazide (HYDRODIURIL) 12.5 MG tablet TAKE 1 TABLET(12.5 MG) BY MOUTH DAILY 90 tablet 3   hydrocortisone 2.5 % lotion Apply topically 2 (two) times daily. Apply to area of rash. 118 mL 0   levothyroxine (SYNTHROID) 100 MCG tablet TAKE 1 TABLET BY MOUTH DAILY 90 tablet 3   loratadine (CLARITIN) 10 MG tablet Take 10 mg by mouth daily. Take to reduce risk of rash from everolimus.      nystatin-lidocaine-prednisoLONE-diphenhydrAMINE-distilled water-alum & mag hydroxide-simeth Swish and spit 5-66ml four times a day as needed 480 mL 3   olopatadine (PATANOL) 0.1 % ophthalmic solution Place 1 drop into the right eye 2 (two) times daily. 5 mL 12   ondansetron (ZOFRAN) 8 MG tablet Take 1 tablet (8 mg total) by mouth every 8 (eight) hours as needed for nausea or vomiting. 20 tablet 2   pantoprazole (PROTONIX) 20 MG tablet TAKE 1 TABLET(20 MG) BY MOUTH DAILY 90 tablet 0   potassium chloride SA (KLOR-CON M) 20 MEQ tablet TAKE 1 TABLET BY MOUTH TWICE DAILY 60 tablet 3   predniSONE (DELTASONE) 10 MG tablet prednisone taper 40mg  x 2 days, 30mg  x 2 days, 20mg  x 2 days, 10mg  x 2 days, then stop 10 tablet 0   PREVIDENT 5000 PLUS 1.1 % CREA dental cream SMARTSIG:Application By Mouth     Probiotic Product (ALIGN PO) Take by mouth.     rosuvastatin (CRESTOR) 5 MG tablet Take 1 tablet (5 mg total) by mouth daily. 90 tablet 3   traMADol (ULTRAM) 50 MG tablet Take 1 tablet (50 mg total) by mouth every  6 (six) hours as needed. 60 tablet 0   traZODone (DESYREL) 50 MG tablet TAKE 1 TABLET BY MOUTH DAILY 30 tablet 6   triamcinolone cream (KENALOG) 0.1 % Apply 1 application. topically 2 (two) times daily. 30 g 0   No current facility-administered medications for this visit.    VITAL SIGNS: There were no vitals taken for this visit. There were no vitals filed for this visit.  Estimated body mass index is 31.55 kg/m as calculated from the following:   Height as of 05/31/22: 5\' 6"  (1.676 m).   Weight as of an earlier encounter on 06/28/22: 88.7 kg (195 lb 8 oz).  LABS: CBC:    Component Value Date/Time   WBC 4.5 06/28/2022 1001   HGB 10.9 (L) 06/28/2022 1001   HGB 10.3 (L) 05/21/2022 1256   HGB 14.2 02/11/2011 1349   HCT 33.7 (L) 06/28/2022 1001   HCT 40.9 02/11/2011 1349   PLT 150 06/28/2022 1001   PLT 129 (L) 05/21/2022 1256   PLT 151 02/11/2011 1349   MCV 89.2 06/28/2022 1001   MCV 89  02/11/2011 1349   NEUTROABS 3.8 06/28/2022 1001   LYMPHSABS 0.2 (L) 06/28/2022 1001   MONOABS 0.4 06/28/2022 1001   EOSABS 0.1 06/28/2022 1001   BASOSABS 0.0 06/28/2022 1001   Comprehensive Metabolic Panel:    Component Value Date/Time   NA 134 (L) 06/28/2022 1001   NA 142 02/11/2011 1349   K 3.1 (L) 06/28/2022 1001   K 3.8 02/11/2011 1349   CL 97 (L) 06/28/2022 1001   CL 107 02/11/2011 1349   CO2 24 06/28/2022 1001   CO2 24 02/11/2011 1349   BUN 13 06/28/2022 1001   BUN 14 02/11/2011 1349   CREATININE 1.40 (H) 06/28/2022 1001   CREATININE 1.37 (H) 06/14/2022 1400   CREATININE 0.65 02/11/2011 1349   GLUCOSE 141 (H) 06/28/2022 1001   GLUCOSE 98 02/11/2011 1349   CALCIUM 9.1 06/28/2022 1001   CALCIUM 9.0 02/11/2011 1349   AST 41 06/28/2022 1001   AST 32 06/14/2022 1400   ALT 23 06/28/2022 1001   ALT 21 06/14/2022 1400   ALT 36 02/11/2011 1349   ALKPHOS 95 06/28/2022 1001   ALKPHOS 122 02/11/2011 1349   BILITOT 0.6 06/28/2022 1001   BILITOT 0.9 06/14/2022 1400   PROT 7.3 06/28/2022 1001   PROT 7.0 02/11/2011 1349   ALBUMIN 3.6 06/28/2022 1001   ALBUMIN 4.1 02/11/2011 1349     Present during today's visit: Patient only  Assessment and Plan: Rash did not return from since resuming everolimus at 7.5 mg daily, along with loratadine 10 mg daily Diarrhea: Patient is still have days of multiple loose stools. Recommended patient for now take loperamide 2mg  daily, then adjust as needed based on her bowel movements  Patient receiving IV fluids in clinic today   Oral Chemotherapy Side Effect/Intolerance:  See above  Oral Chemotherapy Adherence: N/A No patient barriers to medication adherence identified.   New medications: none reported  Medication Access Issues: no issues, pt fills at [facility] (Specialty)  Patient expressed understanding and was in agreement with this plan. She also understands that She can call clinic at any time with any questions,  concerns, or complaints.   Follow-up plan: RTC in 4 weeks  Thank you for allowing me to participate in the care of this very pleasant patient.   Time Total: 15 mins  Visit consisted of counseling and education on dealing with issues of symptom management in  the setting of serious and potentially life-threatening illness.Greater than 50%  of this time was spent counseling and coordinating care related to the above assessment and plan.  Signed by: Remi Haggard, PharmD, BCPS, BCOP, CPP Hematology/Oncology Clinical Pharmacist Practitioner [place]/DB/AP Oral Chemotherapy Navigation Clinic [phone]

## 2022-06-29 LAB — CANCER ANTIGEN 27.29: CA 27.29: 176.4 U/mL — ABNORMAL HIGH (ref 0.0–38.6)

## 2022-07-09 ENCOUNTER — Ambulatory Visit: Payer: Medicare Other | Admitting: Radiation Oncology

## 2022-07-13 ENCOUNTER — Other Ambulatory Visit: Payer: Self-pay

## 2022-07-13 ENCOUNTER — Other Ambulatory Visit (HOSPITAL_COMMUNITY): Payer: Self-pay

## 2022-07-16 ENCOUNTER — Other Ambulatory Visit (HOSPITAL_COMMUNITY): Payer: Self-pay

## 2022-07-18 ENCOUNTER — Encounter: Payer: Self-pay | Admitting: Radiology

## 2022-07-18 ENCOUNTER — Other Ambulatory Visit: Payer: Self-pay

## 2022-07-18 ENCOUNTER — Inpatient Hospital Stay
Admission: EM | Admit: 2022-07-18 | Discharge: 2022-08-01 | DRG: 682 | Disposition: A | Discharge status: Skilled Nursing Facility | Payer: Medicare Other | Attending: Internal Medicine | Admitting: Internal Medicine

## 2022-07-18 ENCOUNTER — Emergency Department: Payer: Medicare Other

## 2022-07-18 ENCOUNTER — Telehealth: Payer: Self-pay | Admitting: *Deleted

## 2022-07-18 DIAGNOSIS — D61818 Other pancytopenia: Secondary | ICD-10-CM | POA: Diagnosis present

## 2022-07-18 DIAGNOSIS — N179 Acute kidney failure, unspecified: Principal | ICD-10-CM

## 2022-07-18 DIAGNOSIS — R5383 Other fatigue: Secondary | ICD-10-CM | POA: Diagnosis not present

## 2022-07-18 DIAGNOSIS — G9341 Metabolic encephalopathy: Secondary | ICD-10-CM | POA: Diagnosis not present

## 2022-07-18 DIAGNOSIS — L89152 Pressure ulcer of sacral region, stage 2: Secondary | ICD-10-CM | POA: Diagnosis present

## 2022-07-18 DIAGNOSIS — F419 Anxiety disorder, unspecified: Secondary | ICD-10-CM

## 2022-07-18 DIAGNOSIS — R197 Diarrhea, unspecified: Secondary | ICD-10-CM

## 2022-07-18 DIAGNOSIS — Z6831 Body mass index (BMI) 31.0-31.9, adult: Secondary | ICD-10-CM

## 2022-07-18 DIAGNOSIS — Z79890 Hormone replacement therapy (postmenopausal): Secondary | ICD-10-CM | POA: Diagnosis not present

## 2022-07-18 DIAGNOSIS — Z86711 Personal history of pulmonary embolism: Secondary | ICD-10-CM | POA: Diagnosis not present

## 2022-07-18 DIAGNOSIS — Z841 Family history of disorders of kidney and ureter: Secondary | ICD-10-CM

## 2022-07-18 DIAGNOSIS — E878 Other disorders of electrolyte and fluid balance, not elsewhere classified: Secondary | ICD-10-CM | POA: Diagnosis not present

## 2022-07-18 DIAGNOSIS — I2489 Other forms of acute ischemic heart disease: Secondary | ICD-10-CM | POA: Diagnosis not present

## 2022-07-18 DIAGNOSIS — Z1611 Resistance to penicillins: Secondary | ICD-10-CM | POA: Diagnosis present

## 2022-07-18 DIAGNOSIS — E876 Hypokalemia: Secondary | ICD-10-CM | POA: Diagnosis not present

## 2022-07-18 DIAGNOSIS — Z6832 Body mass index (BMI) 32.0-32.9, adult: Secondary | ICD-10-CM

## 2022-07-18 DIAGNOSIS — L89153 Pressure ulcer of sacral region, stage 3: Secondary | ICD-10-CM | POA: Diagnosis not present

## 2022-07-18 DIAGNOSIS — C7951 Secondary malignant neoplasm of bone: Secondary | ICD-10-CM | POA: Diagnosis not present

## 2022-07-18 DIAGNOSIS — N3000 Acute cystitis without hematuria: Secondary | ICD-10-CM | POA: Diagnosis not present

## 2022-07-18 DIAGNOSIS — R7989 Other specified abnormal findings of blood chemistry: Secondary | ICD-10-CM | POA: Diagnosis not present

## 2022-07-18 DIAGNOSIS — J99 Respiratory disorders in diseases classified elsewhere: Secondary | ICD-10-CM | POA: Diagnosis not present

## 2022-07-18 DIAGNOSIS — N1832 Chronic kidney disease, stage 3b: Secondary | ICD-10-CM | POA: Diagnosis present

## 2022-07-18 DIAGNOSIS — Z853 Personal history of malignant neoplasm of breast: Secondary | ICD-10-CM

## 2022-07-18 DIAGNOSIS — Z741 Need for assistance with personal care: Secondary | ICD-10-CM | POA: Diagnosis not present

## 2022-07-18 DIAGNOSIS — N390 Urinary tract infection, site not specified: Secondary | ICD-10-CM | POA: Diagnosis present

## 2022-07-18 DIAGNOSIS — I129 Hypertensive chronic kidney disease with stage 1 through stage 4 chronic kidney disease, or unspecified chronic kidney disease: Secondary | ICD-10-CM | POA: Diagnosis present

## 2022-07-18 DIAGNOSIS — K219 Gastro-esophageal reflux disease without esophagitis: Secondary | ICD-10-CM | POA: Diagnosis not present

## 2022-07-18 DIAGNOSIS — G4733 Obstructive sleep apnea (adult) (pediatric): Secondary | ICD-10-CM | POA: Diagnosis not present

## 2022-07-18 DIAGNOSIS — E785 Hyperlipidemia, unspecified: Secondary | ICD-10-CM | POA: Diagnosis not present

## 2022-07-18 DIAGNOSIS — E669 Obesity, unspecified: Secondary | ICD-10-CM | POA: Diagnosis present

## 2022-07-18 DIAGNOSIS — M6259 Muscle wasting and atrophy, not elsewhere classified, multiple sites: Secondary | ICD-10-CM | POA: Diagnosis not present

## 2022-07-18 DIAGNOSIS — Z823 Family history of stroke: Secondary | ICD-10-CM

## 2022-07-18 DIAGNOSIS — D631 Anemia in chronic kidney disease: Secondary | ICD-10-CM | POA: Diagnosis present

## 2022-07-18 DIAGNOSIS — B9689 Other specified bacterial agents as the cause of diseases classified elsewhere: Secondary | ICD-10-CM | POA: Diagnosis present

## 2022-07-18 DIAGNOSIS — Z882 Allergy status to sulfonamides status: Secondary | ICD-10-CM

## 2022-07-18 DIAGNOSIS — J159 Unspecified bacterial pneumonia: Secondary | ICD-10-CM | POA: Diagnosis present

## 2022-07-18 DIAGNOSIS — A419 Sepsis, unspecified organism: Secondary | ICD-10-CM | POA: Diagnosis not present

## 2022-07-18 DIAGNOSIS — L8990 Pressure ulcer of unspecified site, unspecified stage: Secondary | ICD-10-CM | POA: Insufficient documentation

## 2022-07-18 DIAGNOSIS — Z9012 Acquired absence of left breast and nipple: Secondary | ICD-10-CM

## 2022-07-18 DIAGNOSIS — E871 Hypo-osmolality and hyponatremia: Secondary | ICD-10-CM

## 2022-07-18 DIAGNOSIS — J9811 Atelectasis: Secondary | ICD-10-CM | POA: Diagnosis not present

## 2022-07-18 DIAGNOSIS — R1310 Dysphagia, unspecified: Secondary | ICD-10-CM

## 2022-07-18 DIAGNOSIS — R531 Weakness: Secondary | ICD-10-CM

## 2022-07-18 DIAGNOSIS — J189 Pneumonia, unspecified organism: Secondary | ICD-10-CM | POA: Diagnosis not present

## 2022-07-18 DIAGNOSIS — R9431 Abnormal electrocardiogram [ECG] [EKG]: Secondary | ICD-10-CM

## 2022-07-18 DIAGNOSIS — Z8249 Family history of ischemic heart disease and other diseases of the circulatory system: Secondary | ICD-10-CM

## 2022-07-18 DIAGNOSIS — E039 Hypothyroidism, unspecified: Secondary | ICD-10-CM | POA: Diagnosis present

## 2022-07-18 DIAGNOSIS — I4581 Long QT syndrome: Secondary | ICD-10-CM | POA: Diagnosis not present

## 2022-07-18 DIAGNOSIS — E860 Dehydration: Secondary | ICD-10-CM | POA: Diagnosis not present

## 2022-07-18 DIAGNOSIS — Z79811 Long term (current) use of aromatase inhibitors: Secondary | ICD-10-CM

## 2022-07-18 DIAGNOSIS — E440 Moderate protein-calorie malnutrition: Secondary | ICD-10-CM | POA: Diagnosis not present

## 2022-07-18 DIAGNOSIS — K529 Noninfective gastroenteritis and colitis, unspecified: Secondary | ICD-10-CM | POA: Diagnosis present

## 2022-07-18 DIAGNOSIS — Z515 Encounter for palliative care: Secondary | ICD-10-CM

## 2022-07-18 DIAGNOSIS — Z923 Personal history of irradiation: Secondary | ICD-10-CM

## 2022-07-18 DIAGNOSIS — R41841 Cognitive communication deficit: Secondary | ICD-10-CM | POA: Diagnosis not present

## 2022-07-18 DIAGNOSIS — R410 Disorientation, unspecified: Secondary | ICD-10-CM | POA: Diagnosis not present

## 2022-07-18 DIAGNOSIS — T451X5A Adverse effect of antineoplastic and immunosuppressive drugs, initial encounter: Secondary | ICD-10-CM | POA: Diagnosis present

## 2022-07-18 DIAGNOSIS — I10 Essential (primary) hypertension: Secondary | ICD-10-CM | POA: Diagnosis not present

## 2022-07-18 DIAGNOSIS — R4182 Altered mental status, unspecified: Secondary | ICD-10-CM | POA: Diagnosis not present

## 2022-07-18 DIAGNOSIS — C50919 Malignant neoplasm of unspecified site of unspecified female breast: Secondary | ICD-10-CM | POA: Diagnosis not present

## 2022-07-18 DIAGNOSIS — Z87891 Personal history of nicotine dependence: Secondary | ICD-10-CM

## 2022-07-18 DIAGNOSIS — G4700 Insomnia, unspecified: Secondary | ICD-10-CM | POA: Diagnosis not present

## 2022-07-18 DIAGNOSIS — R0602 Shortness of breath: Secondary | ICD-10-CM | POA: Diagnosis not present

## 2022-07-18 DIAGNOSIS — R918 Other nonspecific abnormal finding of lung field: Secondary | ICD-10-CM | POA: Diagnosis not present

## 2022-07-18 DIAGNOSIS — S2249XA Multiple fractures of ribs, unspecified side, initial encounter for closed fracture: Secondary | ICD-10-CM | POA: Diagnosis not present

## 2022-07-18 DIAGNOSIS — E569 Vitamin deficiency, unspecified: Secondary | ICD-10-CM | POA: Diagnosis not present

## 2022-07-18 HISTORY — DX: Other disorders of electrolyte and fluid balance, not elsewhere classified: E87.8

## 2022-07-18 HISTORY — DX: Acute kidney failure, unspecified: N17.9

## 2022-07-18 LAB — URINALYSIS, ROUTINE W REFLEX MICROSCOPIC
Bilirubin Urine: NEGATIVE
Glucose, UA: NEGATIVE mg/dL
Ketones, ur: NEGATIVE mg/dL
Nitrite: NEGATIVE
Protein, ur: 100 mg/dL — AB
Specific Gravity, Urine: 1.004 — ABNORMAL LOW (ref 1.005–1.030)
WBC, UA: >50 WBC/hpf (ref 0–5)
pH: 5 (ref 5.0–8.0)

## 2022-07-18 LAB — CBC WITH DIFFERENTIAL/PLATELET
Abs Immature Granulocytes: 0.14 10*3/uL — ABNORMAL HIGH (ref 0.00–0.07)
Basophils Absolute: 0 10*3/uL (ref 0.0–0.1)
Basophils Relative: 1 %
Eosinophils Absolute: 0 10*3/uL (ref 0.0–0.5)
Eosinophils Relative: 0 %
HCT: 33.6 % — ABNORMAL LOW (ref 36.0–46.0)
Hemoglobin: 11.2 g/dL — ABNORMAL LOW (ref 12.0–15.0)
Immature Granulocytes: 2 %
Lymphocytes Relative: 3 %
Lymphs Abs: 0.2 10*3/uL — ABNORMAL LOW (ref 0.7–4.0)
MCH: 27.1 pg (ref 26.0–34.0)
MCHC: 33.3 g/dL (ref 30.0–36.0)
MCV: 81.4 fL (ref 80.0–100.0)
Monocytes Absolute: 0.6 10*3/uL (ref 0.1–1.0)
Monocytes Relative: 9 %
Neutro Abs: 5.5 10*3/uL (ref 1.7–7.7)
Neutrophils Relative %: 85 %
Platelets: 215 10*3/uL (ref 150–400)
RBC: 4.13 MIL/uL (ref 3.87–5.11)
RDW: 16.6 % — ABNORMAL HIGH (ref 11.5–15.5)
WBC: 6.4 10*3/uL (ref 4.0–10.5)
nRBC: 0 % (ref 0.0–0.2)

## 2022-07-18 LAB — LACTIC ACID, PLASMA
Lactic Acid, Venous: 2.7 mmol/L (ref 0.5–1.9)
Lactic Acid, Venous: 1.5 mmol/L (ref 0.5–1.9)

## 2022-07-18 LAB — MAGNESIUM: Magnesium: 1.8 mg/dL (ref 1.7–2.4)

## 2022-07-18 LAB — COMPREHENSIVE METABOLIC PANEL
ALT: 44 U/L (ref 0–44)
AST: 85 U/L — ABNORMAL HIGH (ref 15–41)
Albumin: 3.6 g/dL (ref 3.5–5.0)
Alkaline Phosphatase: 118 U/L (ref 38–126)
Anion gap: 18 — ABNORMAL HIGH (ref 5–15)
BUN: 18 mg/dL (ref 8–23)
CO2: 24 mmol/L (ref 22–32)
Calcium: 8.6 mg/dL — ABNORMAL LOW (ref 8.9–10.3)
Chloride: 85 mmol/L — ABNORMAL LOW (ref 98–111)
Creatinine, Ser: 2.12 mg/dL — ABNORMAL HIGH (ref 0.44–1.00)
GFR, Estimated: 24 mL/min — ABNORMAL LOW (ref >60)
Glucose, Bld: 147 mg/dL — ABNORMAL HIGH (ref 70–99)
Potassium: 2.2 mmol/L — CL (ref 3.5–5.1)
Sodium: 127 mmol/L — ABNORMAL LOW (ref 135–145)
Total Bilirubin: 1.3 mg/dL — ABNORMAL HIGH (ref 0.3–1.2)
Total Protein: 7.3 g/dL (ref 6.5–8.1)

## 2022-07-18 LAB — TROPONIN I (HIGH SENSITIVITY)
Troponin I (High Sensitivity): 66 ng/L — ABNORMAL HIGH (ref <18)
Troponin I (High Sensitivity): 61 ng/L — ABNORMAL HIGH (ref <18)

## 2022-07-18 LAB — T4, FREE: Free T4: 2.12 ng/dL — ABNORMAL HIGH (ref 0.61–1.12)

## 2022-07-18 LAB — TSH: TSH: 1.073 u[IU]/mL (ref 0.350–4.500)

## 2022-07-18 LAB — PROTIME-INR
INR: 1.2 (ref 0.8–1.2)
Prothrombin Time: 15.5 seconds — ABNORMAL HIGH (ref 11.4–15.2)

## 2022-07-18 MED ORDER — LACTATED RINGERS IV SOLN: INTRAVENOUS | Status: DC

## 2022-07-18 MED ORDER — SODIUM CHLORIDE 0.9% FLUSH
3.0000 mL | Freq: Two times a day (BID) | INTRAVENOUS | Status: DC
  Administered 2022-07-18 – 2022-08-01 (×24): 3 mL via INTRAVENOUS

## 2022-07-18 MED ORDER — LOPERAMIDE HCL 2 MG PO CAPS
2.0000 mg | ORAL_CAPSULE | ORAL | Status: DC | PRN
  Administered 2022-07-19: 2 mg via ORAL
  Filled 2022-07-18: qty 1

## 2022-07-18 MED ORDER — POTASSIUM CHLORIDE 10 MEQ/100ML IV SOLN
10.0000 meq | INTRAVENOUS | Status: AC
  Administered 2022-07-18 (×4): 10 meq via INTRAVENOUS
  Filled 2022-07-18 (×4): qty 100

## 2022-07-18 MED ORDER — PANTOPRAZOLE SODIUM 20 MG PO TBEC
20.0000 mg | DELAYED_RELEASE_TABLET | Freq: Every day | ORAL | Status: DC
  Administered 2022-07-19 – 2022-08-01 (×14): 20 mg via ORAL
  Filled 2022-07-18 (×14): qty 1

## 2022-07-18 MED ORDER — LEVOTHYROXINE SODIUM 100 MCG PO TABS
100.0000 ug | ORAL_TABLET | Freq: Every day | ORAL | Status: DC
  Administered 2022-07-19 – 2022-08-01 (×13): 100 ug via ORAL
  Filled 2022-07-18 (×14): qty 1

## 2022-07-18 MED ORDER — HYDROCODONE-ACETAMINOPHEN 5-325 MG PO TABS: 1.0000 | ORAL_TABLET | Freq: Three times a day (TID) | ORAL | Status: DC | PRN

## 2022-07-18 MED ORDER — ACETAMINOPHEN 325 MG PO TABS
650.0000 mg | ORAL_TABLET | Freq: Four times a day (QID) | ORAL | Status: DC | PRN
  Administered 2022-07-20 – 2022-07-30 (×9): 650 mg via ORAL
  Filled 2022-07-18 (×9): qty 2

## 2022-07-18 MED ORDER — FOLIC ACID 1 MG PO TABS
1.0000 mg | ORAL_TABLET | Freq: Every day | ORAL | Status: DC
  Administered 2022-07-18 – 2022-08-01 (×15): 1 mg via ORAL
  Filled 2022-07-18 (×15): qty 1

## 2022-07-18 MED ORDER — ENOXAPARIN SODIUM 30 MG/0.3ML IJ SOSY
30.0000 mg | PREFILLED_SYRINGE | INTRAMUSCULAR | Status: DC
  Administered 2022-07-18: 30 mg via SUBCUTANEOUS
  Filled 2022-07-18: qty 0.3

## 2022-07-18 MED ORDER — THIAMINE MONONITRATE 100 MG PO TABS
100.0000 mg | ORAL_TABLET | Freq: Every day | ORAL | Status: DC
  Administered 2022-07-18 – 2022-08-01 (×15): 100 mg via ORAL
  Filled 2022-07-18 (×15): qty 1

## 2022-07-18 MED ORDER — POTASSIUM CHLORIDE 20 MEQ PO PACK
40.0000 meq | PACK | Freq: Every day | ORAL | Status: DC
  Administered 2022-07-18 – 2022-07-19 (×2): 40 meq via ORAL
  Filled 2022-07-18 (×2): qty 2

## 2022-07-18 MED ORDER — ASPIRIN 81 MG PO TBEC
81.0000 mg | DELAYED_RELEASE_TABLET | Freq: Every day | ORAL | Status: DC
  Administered 2022-07-18 – 2022-07-31 (×14): 81 mg via ORAL
  Filled 2022-07-18 (×14): qty 1

## 2022-07-18 MED ORDER — LACTATED RINGERS IV BOLUS
1000.0000 mL | Freq: Once | INTRAVENOUS | Status: AC
  Administered 2022-07-18: 1000 mL via INTRAVENOUS

## 2022-07-18 MED ORDER — ACETAMINOPHEN 650 MG RE SUPP: 650.0000 mg | Freq: Four times a day (QID) | RECTAL | Status: DC | PRN

## 2022-07-18 MED ORDER — ALPRAZOLAM 0.25 MG PO TABS
0.2500 mg | ORAL_TABLET | Freq: Two times a day (BID) | ORAL | Status: DC
  Administered 2022-07-18 – 2022-07-21 (×7): 0.25 mg via ORAL
  Filled 2022-07-18 (×8): qty 1

## 2022-07-18 MED ORDER — MAGNESIUM SULFATE IN D5W 1-5 GM/100ML-% IV SOLN
1.0000 g | Freq: Once | INTRAVENOUS | Status: AC
  Administered 2022-07-19: 1 g via INTRAVENOUS
  Filled 2022-07-18 (×2): qty 100

## 2022-07-18 MED ORDER — POTASSIUM CHLORIDE 10 MEQ/100ML IV SOLN: 10.0000 meq | Freq: Once | INTRAVENOUS | Status: DC

## 2022-07-18 MED ORDER — SODIUM CHLORIDE 0.9 % IV SOLN
1.0000 g | INTRAVENOUS | Status: AC
  Administered 2022-07-18 – 2022-07-21 (×4): 1 g via INTRAVENOUS
  Filled 2022-07-18 (×4): qty 10

## 2022-07-18 MED ORDER — SODIUM CHLORIDE 0.9 % IV BOLUS
1000.0000 mL | Freq: Once | INTRAVENOUS | Status: AC
  Administered 2022-07-18: 1000 mL via INTRAVENOUS

## 2022-07-18 MED ORDER — ADULT MULTIVITAMIN W/MINERALS CH
1.0000 | ORAL_TABLET | Freq: Every day | ORAL | Status: DC
  Administered 2022-07-18 – 2022-08-01 (×15): 1 via ORAL
  Filled 2022-07-18 (×15): qty 1

## 2022-07-18 NOTE — Assessment & Plan Note (Addendum)
EKG notable for QTc prolongation in the setting of electrolyte abnormalities.  -Telemetry monitoring - Hold home QTc prolonging agents

## 2022-07-18 NOTE — Assessment & Plan Note (Signed)
Patient has been experiencing chronic diarrhea after receiving pelvic radiation.  Oncology also suspects it could be secondary to Afinitor.   - Imodium as needed

## 2022-07-18 NOTE — Telephone Encounter (Signed)
Call from son reporting that patient is not doing well living alone and that they went to her home to assist her cleaning up and found her so weak that she cannot get around and she was sitting in a puddle of her urine and he assisted her to bathroom and had to take the brunt of her weight as she did  not have the strength to ambulate herself/ He said that she is complaining of being cold and the home is very warm. They did not check her temp to see if she has fever or not. I explained to him that at this time of day there is not a lot we can do for her here. I advised that if they cannot stay with her or take her with them as tey do not want her alone that he should take her to the ED for evaluation. He stated that he felt she was dehydrated too. He agrees to take her to ER

## 2022-07-18 NOTE — ED Notes (Signed)
Purewick removed prior to transport to floor with rn and family  pt alert.  Oxygen in place.  Iv meds infusing.

## 2022-07-18 NOTE — Assessment & Plan Note (Signed)
Initial troponin elevated at 66.  Patient declines any chest pain and EKG does not demonstrate any acute ischemic changes.  QTc prolongation is likely due to hypokalemia.  Low suspicion for cardiac etiology at this time  - Repeat troponin pending

## 2022-07-18 NOTE — Assessment & Plan Note (Signed)
Patient presenting with hyponatremia and hypokalemia in the setting of poor p.o. intake due to chemotherapy for breast cancer.  - IV fluids as ordered - S/p 80 mEq of potassium replacement, both p.o. and IV - Magnesium pending - Repeat BMP in the a.m.

## 2022-07-18 NOTE — Assessment & Plan Note (Addendum)
In the setting of physical deconditioning, advanced cancer and poor p.o. intake.  - PT/OT - TOC consultation

## 2022-07-18 NOTE — ED Provider Notes (Addendum)
[facility] Provider Note    Event Date/Time   First MD Initiated Contact with Patient 07/18/22 1656     (approximate)   History   Altered Mental Status   HPI  Alicia [name] is a 72 y.o. female   Past medical history of hypothyroid on Synthroid, breast cancer on oral chemotherapy, anxiety and depression, hypertension, pulmonary embolism no longer on anticoagulation, who presents to the emergency department with altered mental status and generalized weakness and fatigue for the last 2 days.  She lives alone and is usually independent with all activities of daily living, ambulatory with a cane only.  Her children accompany her to give further information given the patient's disorientation and confusion.  Her son reports that he last saw her on Sunday in her regular state of health.  He then visited yesterday and she seems to be more tired than usual, requiring assistance to get up and go to the bathroom.  She seemed more confused.  She did not complain of any pain or discomfort specifically, but the son notes that she typically keeps her complaints to herself.  She has had ongoing diarrhea without GI bleeding, thought to be related to her chemotherapy medications.  She has had poor p.o. intake.  When I asked the patient how she is feeling she feels "disoriented, tired, confused" and upon review of systems notes that she has some mild chest pressure as well as a mild cough, but denies abdominal pain, or GU complaints.  Son and daughter-in-law state that they sort out the patient's medications on Sunday and noted on Tuesday that she only took her Xanax and thyroid medications and did not take any of her other medications.  Independent Historian contributed to assessment above: Both her son and daughter-in-law at bedside to corroborate information as written above  External Medical Documents Reviewed: Dr. Milinda Cave oncology note from 06/28/2022 documenting her  past oncological history and current treatment planning, noted to have diarrhea perhaps associated with her oncologic medication side effect and hypokalemia thought to be related to her ongoing diarrhea, as well as a history of pulmonary embolism no longer on anticoagulation because of no current PE from CT scan imaging in 2021, original PE was from 2019.  "After lengthy discussion, it was agreed upon that patient will switch treatments to Afinitor 10 mg daily along with Aromasin 25 mg daily. Ibrance and Faslodex have been discontinued. [name] has been permanently discontinued given her issues with her mandible. "      Physical Exam   Triage Vital Signs: ED Triage Vitals [07/18/22 1641]  Enc Vitals Group     BP 111/69     Pulse Rate (!) 102     Resp 16     Temp 97.7 F (36.5 C)     Temp Source Oral     SpO2 93 %     Weight 195 lb (88.5 kg)     Height 5\' 6"  (1.676 m)     Head Circumference      Peak Flow      Pain Score      Pain Loc      Pain Edu?      Excl. in GC?     Most recent vital signs: Vitals:   07/18/22 1641 07/18/22 1734  BP: 111/69   Pulse: (!) 102 87  Resp: 16 16  Temp: 97.7 F (36.5 C)   SpO2: 93% 95%    General: Awake, no distress.  CV:  Good peripheral perfusion.  Resp:  Normal effort.  Abd:  No distention.  Other:  Awake alert oriented to self situation and time.  She follows all commands.  She appears dehydrated with dry mucous membranes and poor skin turgor.  Her abdomen is soft and nontender to palpation all quadrants and her lungs are clear without focality or wheezing.  She has no focal neurologic deficits including dysarthria, facial asymmetry, motor or sensory deficits to bilateral upper and lower extremities.  She has no unilateral leg swelling.  She is mildly tachycardic with a pulse of 102.   ED Results / Procedures / Treatments   Labs (all labs ordered are listed, but only abnormal results are displayed) Labs Reviewed  COMPREHENSIVE  METABOLIC PANEL - Abnormal; Notable for the following components:      Result Value   Sodium 127 (*)    Potassium 2.2 (*)    Chloride 85 (*)    Glucose, Bld 147 (*)    Creatinine, Ser 2.12 (*)    Calcium 8.6 (*)    AST 85 (*)    Total Bilirubin 1.3 (*)    GFR, Estimated 24 (*)    Anion gap 18 (*)    All other components within normal limits  LACTIC ACID, PLASMA - Abnormal; Notable for the following components:   Lactic Acid, Venous 2.7 (*)    All other components within normal limits  CBC WITH DIFFERENTIAL/PLATELET - Abnormal; Notable for the following components:   Hemoglobin 11.2 (*)    HCT 33.6 (*)    RDW 16.6 (*)    Lymphs Abs 0.2 (*)    Abs Immature Granulocytes 0.14 (*)    All other components within normal limits  PROTIME-INR - Abnormal; Notable for the following components:   Prothrombin Time 15.5 (*)    All other components within normal limits  CULTURE, BLOOD (ROUTINE X 2)  CULTURE, BLOOD (ROUTINE X 2)  LACTIC ACID, PLASMA  URINALYSIS, ROUTINE W REFLEX MICROSCOPIC  T4, FREE  TSH  MAGNESIUM     I ordered and reviewed the above labs they are notable for white blood cell count of 6.4 and baseline H&H at 11/33. AKI w hypokalemia and hyponatremia.  EKG  ED ECG REPORT I, Pilar Jarvis, the attending physician, personally viewed and interpreted this ECG.   Date: 07/18/2022  EKG Time: 1713  Rate: 86  Rhythm: sinus  Axis: nl  Intervals: rbbb  ST&T Change: no stemi    RADIOLOGY I independently reviewed and interpreted chest x-ray and see perhaps some haziness in the right lung field concerning for pneumonia.  No pneumothorax.   PROCEDURES:  Critical Care performed: No  Procedures   MEDICATIONS ORDERED IN ED: Medications  potassium chloride 10 mEq in 100 mL IVPB (has no administration in time range)  potassium chloride (KLOR-CON) packet 40 mEq (has no administration in time range)  sodium chloride 0.9 % bolus 1,000 mL (1,000 mLs Intravenous New  Bag/Given 07/18/22 1734)    External physician / consultants:  I spoke with hospitalist for admission and regarding care plan for this patient.   IMPRESSION / MDM / ASSESSMENT AND PLAN / ED COURSE  I reviewed the triage vital signs and the nursing notes.                                Patient's presentation is most consistent with acute presentation with potential threat to life or bodily function.  Differential diagnosis includes, but is not limited to, dehydration, electrolyte derangements, adverse effect of chemotherapy medication, urinary tract infection, bacterial pneumonia respiratory infection, PE, ACS, sepsis   The patient is on the cardiac monitor to evaluate for evidence of arrhythmia and/or significant heart rate changes.  MDM:    This patient with vague symptoms generalized weakness and fatigue over the last several days, confusion, who is a cancer patient on oral chemotherapy.  She does have a mild cough and chest pressure/shortness of breath but only admits this on my questioning.  Check EKG which is nonischemic, follow-up with serial troponins to assess for ACS.  Check chest x-ray, labs, for evidence of bacterial pneumonia.  Check urinary tract infection with UA.  Likely dehydrated given clinical dehydration and noted diarrhea poor p.o. intake.  Will give IV crystalloid bolus for dehydration.  I considered CVA but I think less likely given nonfocal neurologic exam as above.  I considered PE but the patient chief complaint is generalized weakness, and notes only some chest discomfort and shortness of breath on my review of systems with normal hemodynamics aside from slight tachycardia I would consider CT angiogram for PE if renal function allows today, and there is no other source of her illness elicited on workup as above.  --  Patient with marked AKI as well as hyponatremia and hypokalemia most likely in the setting of poor p.o. intake, dehydration, ongoing diarrhea.   Urinalysis is pending collection, she did just urinate prior to arrival in triage, PureWick is placed.  Getting fluids and K repletion, magnesium level pending.  She has QTc prolongation on EKG consistent with her hypokalemia.  Admission.       FINAL CLINICAL IMPRESSION(S) / ED DIAGNOSES   Final diagnoses:  Altered mental status, unspecified altered mental status type  Dehydration  Generalized weakness  Hypokalemia  Hyponatremia  Acute kidney injury (HCC)     Rx / DC Orders   ED Discharge Orders     None        Note:  This document was prepared using Dragon voice recognition software and may include unintentional dictation errors.    Pilar Jarvis, MD 07/18/22 Si Gaul    Pilar Jarvis, MD 07/18/22 [phone]

## 2022-07-18 NOTE — ED Notes (Signed)
Pt de-stated to 87-88% while sleeping. Placed on 2 L nasal cannula with improvement of SpO2 to 96%

## 2022-07-18 NOTE — Assessment & Plan Note (Addendum)
I suspect altered mental status is multifactorial in the setting of poor p.o. intake, profound dehydration with electrolyte abnormalities and AKI.  No focal findings on examination to suggest CVA.  We discussed the utility of obtaining an MRI of the brain to rule out metastasis, however via shared decision making, we will hold off at this time and reassess patient tomorrow.  If no improvement in confusion, can obtain at that time.  No urinary symptoms reported.  - IV fluids as ordered - Electrolyte replacement as noted below - Urinalysis pending

## 2022-07-18 NOTE — ED Triage Notes (Signed)
Family states that patient became more altered than normal earlier this week. PT states she feels like she is dehydrated. PT taking a maintenance oral chemo treatment. Denies vomiting. PT with slurred speech since Monday per the family. Pt states she may be having a little bit of trouble breathing. Family endorses that she urinated on herself earlier today and didn't know that she had done it. PT typically continent.

## 2022-07-18 NOTE — Assessment & Plan Note (Addendum)
In the setting of poor p.o. intake.  - IV fluids as ordered - Avoid nephrotoxic agents - Strict in and out - Daily BMP - Urinalysis pending

## 2022-07-18 NOTE — Assessment & Plan Note (Signed)
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  She has completed radiation therapy.  She is currently on Afinitor and Aromasin.  - Hold home chemotherapy in the setting of hospitalization

## 2022-07-18 NOTE — H&P (Addendum)
History and Physical    Patient: Alicia [name] ZOX:[id number] DOB: Aug 13, 1950 DOA: 07/18/2022 DOS: the patient was seen and examined on 07/18/2022 PCP: Corky Downs, MD  Patient coming from: Home  Chief Complaint:  Chief Complaint  Patient presents with   Altered Mental Status   HPI: Alicia [name] is a 72 y.o. 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  History obtained from patient and her son.  Alicia [name] states that for the last couple days, she has been feeling confused and disoriented, describing it as not understanding what is going on around her.  She endorses chronic diarrhea that has been persistent but denies any melena or hematochezia.  She endorses poor p.o. intake but denies any nausea, vomiting.  Her son at bedside states that on 6/30, he checked on her and she seemed at her baseline.  Since then, she has been rapidly declining.  She has been so weak that she has been unable to get up and use the restroom on her own.  No focal weakness.  No reported fevers or abdominal pain.  At this time, Alicia [name] denies any shortness of breath, chest pain or palpitations.  Patient's son feels that patient symptoms have gotten worse since she was taken off her bone injection.  He notes increased pain.  Per chart review, [name] was discontinued by Oncology due to mandible concerns.   ED course: On arrival to the ED, patient was normotensive at 111/69 with heart rate of 102.  Heart rate improved to 68.  She was saturating at 93% on room air.  While sleeping, patient desaturated to 80% and was placed on supplemental oxygen.  She was afebrile 97.7.  Initial workup demonstrated sodium of 127, potassium 2.2, glucose 147, creatinine 2.112, BUN 18, anion gap 18, and GFR 24.  Lactic acid 2.7.  CBC with  hemoglobin of 11.2 with normal WBC of 6.4. Chest x-ray was obtained with no acute cardiopulmonary disease.  Patient was started on potassium, and IV fluids.  TRH contacted for admission.  Review of Systems: As mentioned in the history of present illness. All other systems reviewed and are negative.  Past Medical History:  Diagnosis Date   Anxiety    Breast cancer, left (HCC) 07/2017   Depression    Hypertension    Hypothyroidism    Personal history of chemotherapy 2019   LEFT mastectomy-chemo before   Personal history of radiation therapy 03/2018   LEFT mastectomy   Rapid heart rate    Thyroid disease    Past Surgical History:  Procedure Laterality Date   AXILLARY LYMPH NODE BIOPSY Left 07/16/2017   METASTATIC MAMMARY CARCINOMA   BREAST BIOPSY Left 07/16/2017   [place] bx of left breast mass and left breast LN.  INVASIVE MAMMARY CARCINOMA, NO SPECIAL TYPE.    BREAST EXCISIONAL BIOPSY Right 2001   benign   BREAST LUMPECTOMY WITH SENTINEL LYMPH NODE BIOPSY Left 12/06/2017   Procedure: BREAST LUMPECTOMY WITH SENTINEL LYMPH NODE BX;  Surgeon: Earline Mayotte, MD;  Location: [facility] ORS;  Service: General;  Laterality: Left;   COLONOSCOPY     MASTECTOMY Left 12/23/2017   PORTACATH PLACEMENT Right 08/07/2017   Procedure: INSERTION PORT-A-CATH;  Surgeon: Earline Mayotte, MD;  Location: [facility] ORS;  Service: General;  Laterality: Right;   SIMPLE MASTECTOMY WITH AXILLARY SENTINEL  NODE BIOPSY Left 12/23/2017   T2,N2 with 6/7 nodes positive. Whole breast radiation.  Surgeon: Earline Mayotte, MD;  Location: [facility] ORS;  Service: General;  Laterality: Left;   TONSILLECTOMY     Social History:  reports that she quit smoking about 5 years ago. Her smoking use included cigarettes. She has a 30.00 pack-year smoking history. She has never used smokeless tobacco. She reports that she does not currently use alcohol. She reports that she does not use drugs.  Allergies  Allergen Reactions   Sulfa  Antibiotics Diarrhea    Family History  Problem Relation Age of Onset   Stroke Mother    Thyroid disease Mother    Renal Disease Mother    Stroke Father    Heart attack Father    Sudden death Father 65       suicide   Anuerysm Brother    Breast cancer Neg Hx     Prior to Admission medications   Medication Sig Start Date End Date Taking? Authorizing Provider  ALPRAZolam Prudy Feeler) 0.5 MG tablet Take 1 tablet (0.5 mg total) by mouth 2 (two) times daily. 04/05/22  Yes Jeralyn Ruths, MD  amLODipine (NORVASC) 5 MG tablet Take 1 tablet (5 mg total) by mouth daily. 09/26/21  Yes Masoud, Renda Rolls, MD  acetaminophen (TYLENOL) 500 MG tablet Take 1,000 mg by mouth every 6 (six) hours as needed for moderate pain or headache.     [provider]  aspirin EC 81 MG tablet Take 81 mg by mouth at bedtime.     [provider]  calcium carbonate (OS-CAL - DOSED IN MG OF ELEMENTAL CALCIUM) 1250 (500 Ca) MG tablet Take 1 tablet by mouth daily with breakfast.    [provider]  chlorhexidine (PERIDEX) 0.12 % solution SMARTSIG:By Mouth 06/26/21   [provider]  Cholecalciferol (VITAMIN D3) 125 MCG (5000 UT) CAPS Take 1 capsule by mouth 2 (two) times a day.    [provider]  dexamethasone (DECADRON) 0.5 MG/5ML solution Swish 10 mLs (1 mg total) by mouth for 2 mins, then spit. Swish 4 (four) times daily. 04/05/22   Remi Haggard, RPH-CPP  docusate sodium (COLACE) 100 MG capsule Take 100 mg by mouth 3 (three) times daily as needed for mild constipation.    [provider]  everolimus (AFINITOR) 7.5 MG tablet Take 1 tablet (7.5 mg total) by mouth daily. 06/28/22   Remi Haggard, RPH-CPP  exemestane (AROMASIN) 25 MG tablet Take 1 tablet (25 mg total) by mouth daily after breakfast. 06/28/22   Remi Haggard, RPH-CPP  hydrochlorothiazide (HYDRODIURIL) 12.5 MG tablet TAKE 1 TABLET(12.5 MG) BY MOUTH DAILY Patient taking differently: Take 12.5 mg by mouth  daily. 01/22/22   Corky Downs, MD  hydrocortisone 2.5 % lotion Apply topically 2 (two) times daily. Apply to area of rash. 06/04/22   Remi Haggard, RPH-CPP  levothyroxine (SYNTHROID) 100 MCG tablet TAKE 1 TABLET BY MOUTH DAILY 09/19/21   Corky Downs, MD  loratadine (CLARITIN) 10 MG tablet Take 10 mg by mouth daily. Take to reduce risk of rash from everolimus.    [provider]  nystatin-lidocaine-prednisoLONE-diphenhydrAMINE-distilled water-alum & mag hydroxide-simeth Swish and spit 5-100ml four times a day as needed 05/03/20   Remi Haggard, RPH-CPP  olopatadine (PATANOL) 0.1 % ophthalmic solution Place 1 drop into the right eye 2 (two) times daily. 05/04/21   Kara Dies, NP  ondansetron (ZOFRAN) 8 MG tablet Take 1 tablet (8 mg total)  by mouth every 8 (eight) hours as needed for nausea or vomiting. 04/05/22   Remi Haggard, RPH-CPP  pantoprazole (PROTONIX) 20 MG tablet TAKE 1 TABLET(20 MG) BY MOUTH DAILY 04/05/22   Jeralyn Ruths, MD  potassium chloride SA (KLOR-CON M) 20 MEQ tablet TAKE 1 TABLET BY MOUTH TWICE DAILY 06/25/22   Jeralyn Ruths, MD  predniSONE (DELTASONE) 10 MG tablet prednisone taper 40mg  x 2 days, 30mg  x 2 days, 20mg  x 2 days, 10mg  x 2 days, then stop 05/24/22   Borders, Daryl Eastern, NP  PREVIDENT 5000 PLUS 1.1 % CREA dental cream SMARTSIG:Application By Mouth 06/13/21   [provider]  Probiotic Product (ALIGN PO) Take by mouth.    [provider]  rosuvastatin (CRESTOR) 5 MG tablet Take 1 tablet (5 mg total) by mouth daily. 08/22/21   Corky Downs, MD  traMADol (ULTRAM) 50 MG tablet Take 1 tablet (50 mg total) by mouth every 6 (six) hours as needed. 05/04/22   Jeralyn Ruths, MD  traZODone (DESYREL) 50 MG tablet TAKE 1 TABLET BY MOUTH DAILY 10/17/21   Corky Downs, MD  triamcinolone cream (KENALOG) 0.1 % Apply 1 application. topically 2 (two) times daily. 05/04/21   Kara Dies, NP    Physical Exam: Vitals:   07/18/22 1913  07/18/22 1930 07/18/22 1945 07/18/22 2000  BP:  (!) 98/58  122/61  Pulse: 68 69  73  Resp: 19 17 13  (!) 9  Temp:      TempSrc:      SpO2: 93% 95%  98%  Weight:      Height:       Physical Exam Vitals and nursing note reviewed.  Constitutional:      General: She is not in acute distress.    Comments: Patient appears fatigued  HENT:     Head: Normocephalic and atraumatic.     Mouth/Throat:     Mouth: Mucous membranes are dry.  Eyes:     Extraocular Movements: Extraocular movements intact.     Conjunctiva/sclera: Conjunctivae normal.     Pupils: Pupils are equal, round, and reactive to light.  Cardiovascular:     Rate and Rhythm: Normal rate and regular rhythm.     Heart sounds: No murmur heard. Pulmonary:     Effort: Pulmonary effort is normal. No respiratory distress.     Breath sounds: Normal breath sounds. No wheezing, rhonchi or rales.  Abdominal:     General: Bowel sounds are normal. There is no distension.     Palpations: Abdomen is soft.     Tenderness: There is no abdominal tenderness.  Musculoskeletal:     Right lower leg: No edema.     Left lower leg: No edema.  Skin:    General: Skin is warm and dry.     Coloration: Skin is pale.  Neurological:     Mental Status: She is alert.     Comments:  Patient is alert and oriented to person, place, time and situation 3/5 strength in all extremities Sensation intact throughout No facial asymmetry or dysarthria Bilateral finger-to-nose normal    Data Reviewed: CBC with WBC 6.4, hemoglobin 11.2, MCV 81 and platelets of 250 CMP with sodium of 127, potassium 2.2, chloride 85, bicarb 24, glucose 147, BUN 18, creatinine 2.12 with GFR 24, anion gap 18, AST 85, ALT 44 and total bilirubin 1.3 Lactic acid 2.7 INR 1.2  EKG personally reviewed.  Sinus rhythm with rate of 86.  First-degree AV block.  Right bundle branch block.  DG Chest Port 1 View  Result Date: 07/18/2022 CLINICAL DATA:  Shortness of breath.  History of  breast cancer. EXAM: PORTABLE CHEST 1 VIEW COMPARISON:  X-ray 10/27/2019.  PET-CT scan 03/29/2022 FINDINGS: Overlapping cardiac leads. Normal cardiopericardial silhouette. No consolidation, pneumothorax or effusion. There is some linear opacity left lung base likely scar or atelectasis. Older rib fractures are identified. The subtle left upper lobe opacity corresponds to the finding by CT scan of some pleural thickening and interstitial septal thickening. Previous left mastectomy. IMPRESSION: No acute cardiopulmonary disease. Chronic changes. Mild left basilar scar or atelectasis. Electronically Signed   By: Karen Kays M.D.   On: 07/18/2022 17:24    Results are pending, will review when available.  Assessment and Plan:  * Altered mental status I suspect altered mental status is multifactorial in the setting of poor p.o. intake, profound dehydration with electrolyte abnormalities and AKI.  No focal findings on examination to suggest CVA.  We discussed the utility of obtaining an MRI of the brain to rule out metastasis, however via shared decision making, we will hold off at this time and reassess patient tomorrow.  If no improvement in confusion, can obtain at that time.  No urinary symptoms reported.  - IV fluids as ordered - Electrolyte replacement as noted below - Urinalysis pending  AKI (acute kidney injury) (HCC) In the setting of poor p.o. intake.  - IV fluids as ordered - Avoid nephrotoxic agents - Strict in and out - Daily BMP - Urinalysis pending  Electrolyte abnormality Patient presenting with hyponatremia and hypokalemia in the setting of poor p.o. intake due to chemotherapy for breast cancer.  - IV fluids as ordered - S/p 80 mEq of potassium replacement, both p.o. and IV - Magnesium pending - Repeat BMP in the a.m.  Generalized weakness In the setting of physical deconditioning, advanced cancer and poor p.o. intake.  - PT/OT - TOC consultation  QT prolongation EKG  notable for QTc prolongation in the setting of electrolyte abnormalities.  -Telemetry monitoring - Hold home QTc prolonging agents  Elevated troponin Initial troponin elevated at 66.  Patient declines any chest pain and EKG does not demonstrate any acute ischemic changes.  QTc prolongation is likely due to hypokalemia.  Low suspicion for cardiac etiology at this time  - Repeat troponin pending  Diarrhea Patient has been experiencing chronic diarrhea after receiving pelvic radiation.  Oncology also suspects it could be secondary to Afinitor.   - Imodium as needed  Malignant neoplasm metastatic to bone [facility]) Patient has a history of recurrent stage IV ER/PR positive, HER2 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  She has completed radiation therapy.  She is currently on Afinitor and Aromasin.  - Hold home chemotherapy in the setting of hospitalization  Primary hypertension Blood pressure has been soft.  - Resume home antihypertensives tomorrow patient's blood pressure remained stable  Advance Care Planning:   Code Status: Full Code verified by patient with son at bedside  Consults: None  Family Communication: Patient's son updated at bedside  Severity of Illness: The appropriate patient status for this patient is OBSERVATION. Observation status is judged to be reasonable and necessary in order to provide the required intensity of service to ensure the patient's safety. The patient's presenting symptoms, physical exam findings, and initial radiographic and laboratory data in the context of their medical condition is felt to place them at decreased risk for further clinical deterioration. Furthermore, it is anticipated that the  patient will be medically stable for discharge from the hospital within 2 midnights of admission.   Author: Verdene Lennert, MD 07/18/2022 8:14 PM  For on call review [email or web address].

## 2022-07-18 NOTE — Assessment & Plan Note (Signed)
Blood pressure has been soft.  - Resume home antihypertensives tomorrow patient's blood pressure remained stable

## 2022-07-18 NOTE — ED Notes (Signed)
Resumed care from [place] rn.  Pt alert.  Pt eating chick fil a.   Family at bedside.  Pt waiting on admission.

## 2022-07-19 DIAGNOSIS — E876 Hypokalemia: Secondary | ICD-10-CM | POA: Diagnosis present

## 2022-07-19 DIAGNOSIS — R531 Weakness: Secondary | ICD-10-CM | POA: Diagnosis not present

## 2022-07-19 DIAGNOSIS — N179 Acute kidney failure, unspecified: Secondary | ICD-10-CM | POA: Diagnosis not present

## 2022-07-19 DIAGNOSIS — R410 Disorientation, unspecified: Secondary | ICD-10-CM | POA: Diagnosis not present

## 2022-07-19 DIAGNOSIS — G9341 Metabolic encephalopathy: Secondary | ICD-10-CM | POA: Diagnosis present

## 2022-07-19 LAB — BASIC METABOLIC PANEL
Anion gap: 13 (ref 5–15)
BUN: 16 mg/dL (ref 8–23)
CO2: 26 mmol/L (ref 22–32)
Calcium: 8.5 mg/dL — ABNORMAL LOW (ref 8.9–10.3)
Chloride: 92 mmol/L — ABNORMAL LOW (ref 98–111)
Creatinine, Ser: 1.8 mg/dL — ABNORMAL HIGH (ref 0.44–1.00)
GFR, Estimated: 29 mL/min — ABNORMAL LOW (ref >60)
Glucose, Bld: 113 mg/dL — ABNORMAL HIGH (ref 70–99)
Potassium: 2.5 mmol/L — CL (ref 3.5–5.1)
Sodium: 131 mmol/L — ABNORMAL LOW (ref 135–145)

## 2022-07-19 LAB — CBC
HCT: 32.5 % — ABNORMAL LOW (ref 36.0–46.0)
Hemoglobin: 10.9 g/dL — ABNORMAL LOW (ref 12.0–15.0)
MCH: 27.3 pg (ref 26.0–34.0)
MCHC: 33.5 g/dL (ref 30.0–36.0)
MCV: 81.5 fL (ref 80.0–100.0)
Platelets: 179 10*3/uL (ref 150–400)
RBC: 3.99 MIL/uL (ref 3.87–5.11)
RDW: 16.4 % — ABNORMAL HIGH (ref 11.5–15.5)
WBC: 5.3 10*3/uL (ref 4.0–10.5)
nRBC: 0 % (ref 0.0–0.2)

## 2022-07-19 LAB — PHOSPHORUS: Phosphorus: 1.3 mg/dL — ABNORMAL LOW (ref 2.5–4.6)

## 2022-07-19 LAB — MAGNESIUM: Magnesium: 2.1 mg/dL (ref 1.7–2.4)

## 2022-07-19 LAB — GLUCOSE, CAPILLARY: Glucose-Capillary: 104 mg/dL — ABNORMAL HIGH (ref 70–99)

## 2022-07-19 MED ORDER — ROSUVASTATIN CALCIUM 10 MG PO TABS
5.0000 mg | ORAL_TABLET | Freq: Every day | ORAL | Status: DC
  Administered 2022-07-19 – 2022-08-01 (×14): 5 mg via ORAL
  Filled 2022-07-19 (×14): qty 1

## 2022-07-19 MED ORDER — SODIUM PHOSPHATES 45 MMOLE/15ML IV SOLN
30.0000 mmol | Freq: Once | INTRAVENOUS | Status: AC
  Administered 2022-07-19: 30 mmol via INTRAVENOUS
  Filled 2022-07-19: qty 10

## 2022-07-19 MED ORDER — TRAZODONE HCL 50 MG PO TABS
50.0000 mg | ORAL_TABLET | Freq: Every day | ORAL | Status: DC
  Administered 2022-07-19 – 2022-07-31 (×13): 50 mg via ORAL
  Filled 2022-07-19 (×13): qty 1

## 2022-07-19 MED ORDER — ENOXAPARIN SODIUM 40 MG/0.4ML IJ SOSY
40.0000 mg | PREFILLED_SYRINGE | INTRAMUSCULAR | Status: DC
  Administered 2022-07-19 – 2022-07-31 (×13): 40 mg via SUBCUTANEOUS
  Filled 2022-07-19 (×13): qty 0.4

## 2022-07-19 MED ORDER — EVEROLIMUS 7.5 MG PO TABS: 7.5000 mg | ORAL_TABLET | Freq: Every day | ORAL | Status: DC

## 2022-07-19 MED ORDER — POTASSIUM CHLORIDE IN NACL 40-0.9 MEQ/L-% IV SOLN
INTRAVENOUS | Status: DC
  Filled 2022-07-19 (×2): qty 1000

## 2022-07-19 MED ORDER — ENSURE ENLIVE PO LIQD
237.0000 mL | Freq: Three times a day (TID) | ORAL | Status: DC
  Administered 2022-07-19 – 2022-08-01 (×32): 237 mL via ORAL

## 2022-07-19 MED ORDER — EXEMESTANE 25 MG PO TABS
25.0000 mg | ORAL_TABLET | Freq: Every day | ORAL | Status: DC
  Administered 2022-07-19 – 2022-08-01 (×14): 25 mg via ORAL
  Filled 2022-07-19 (×14): qty 1

## 2022-07-19 NOTE — Evaluation (Signed)
Physical Therapy Evaluation Patient Details Name: Alicia [name] MRN: [id number] DOB: [birth year] Today's Date: 07/19/2022  History of Present Illness  Alicia [name] is a 72 y.o. 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  Family said he had not taken any of her medicines for about a week prior to admission.  Clinical Impression  Pt pleasant t/o PT session, initially not wanting to do much (having worked with OT earlier today) but with light cuing from PT and family did agree to get up and try some walking.  She displayed general LE weakness, L hip being the most effected (bone mets), but was able to do most mobility with only minA and plenty of extra time and cuing.  She was quick to fatigue with "prolonged" standing/ambulation but did manage to walk ~7 ft with guarded, slow effort - heavily reliant on the walker.  Pt is far from her baseline and would benefit from continued PT to address functional limitations.        Assistance Recommended at Discharge Frequent or constant Supervision/Assistance  If plan is discharge home, recommend the following:  Can travel by private vehicle  A lot of help with walking and/or transfers;A lot of help with bathing/dressing/bathroom;Assistance with cooking/housework;Assistance with feeding;Help with stairs or ramp for entrance;Assist for transportation   No    Equipment Recommendations Other (comment) (TBD at rehab)  Recommendations for Other Services       Functional Status Assessment Patient has had a recent decline in their functional status and demonstrates the ability to make significant improvements in function in a reasonable and predictable amount of time.     Precautions / Restrictions Precautions Precautions:  Fall Restrictions Weight Bearing Restrictions: No      Mobility  Bed Mobility Overal bed mobility: Needs Assistance Bed Mobility: Supine to Sit     Supine to sit: Min assist     General bed mobility comments: Pt did the bulk of the effort to get to sitting needing bed rails, light assist with LE management and repeated cuing between rest breaks to actually scoot up to square with EOB.    Transfers Overall transfer level: Needs assistance Equipment used: Rolling walker (2 wheels) Transfers: Sit to/from Stand Sit to Stand: Min assist           General transfer comment: Cues for set up and sequencing, encouragement during the effort.  Only light assist to insure keeping weight/hips forward and walker stabilization    Ambulation/Gait Ambulation/Gait assistance: Min assist Gait Distance (Feet): 7 Feet Assistive device: Rolling walker (2 wheels)         General Gait Details: Pt hesitant to do a lot as she feels very "weak" but did manange to take a few slow and labored steps forward before requesting to sit.  HR up to high 120s, O2 low 90s with the effort.  Stairs            Wheelchair Mobility     Tilt Bed    Modified Rankin (Stroke Patients Only)       Balance Overall balance assessment: Needs assistance Sitting-balance support: Feet supported, No upper extremity supported Sitting balance-Leahy Scale: Good     Standing balance support: Bilateral upper extremity supported, Reliant on assistive device for balance Standing balance-Leahy Scale: Poor Standing balance comment: reliant  on the walker, quick to fatigue in standing but did not have any overt LOBs while having UE support                             Pertinent Vitals/Pain Pain Assessment Pain Assessment:  (chornic low grade L hip soreness/pain 2/2 bone cancer)    Home Living Family/patient expects to be discharged to:: Private residence Living Arrangements: Alone Available Help  at Discharge: Family;Available PRN/intermittently Type of Home: House Home Access: Stairs to enter Entrance Stairs-Rails: Right (L is too far away and not strong) Entrance Stairs-Number of Steps: 5 Alternate Level Stairs-Number of Steps: flight Home Layout: Two level Home Equipment: Agricultural consultant (2 wheels);Cane - single point;Rollator (4 wheels) Additional Comments: Typically sleeping and bathing upstairs in home. Family reports they can arrange for her to live downstairs full time if needed.    Prior Function Prior Level of Function : Needs assist  Cognitive Assist : ADLs (cognitive)   ADLs (Cognitive): Set up cues         ADLs Comments: Pt has been living alone with family checking in frequently. Family completes IADLs for pt.     Hand Dominance        Extremity/Trunk Assessment   Upper Extremity Assessment Upper Extremity Assessment: Generalized weakness    Lower Extremity Assessment Lower Extremity Assessment: Generalized weakness (L hip 3/5, otherwise grossly 4-/5 with decreased quality of movement)       Communication   Communication: No difficulties;HOH  Cognition Arousal/Alertness: Awake/alert Behavior During Therapy: WFL for tasks assessed/performed Overall Cognitive Status: Impaired/Different from baseline                                 General Comments: increased time to process cuing, answered most questions appropriately but family reports much slower and less engaged than her baseline        General Comments General comments (skin integrity, edema, etc.): Pt repeatedly reports feeling tired/fatigued t/o session.  Did show good effort but ultimately limited functional tolerance.    Exercises     Assessment/Plan    PT Assessment Patient needs continued PT services  PT Problem List Decreased strength;Decreased activity tolerance;Decreased mobility;Decreased balance;Decreased cognition;Decreased knowledge of use of DME;Decreased  safety awareness;Cardiopulmonary status limiting activity       PT Treatment Interventions DME instruction;Gait training;Functional mobility training;Therapeutic activities;Therapeutic exercise;Balance training;Neuromuscular re-education;Cognitive remediation;Patient/family education    PT Goals (Current goals can be found in the Care Plan section)  Acute Rehab PT Goals Patient Stated Goal: get stronger PT Goal Formulation: With patient/family Time For Goal Achievement: 08/01/22 Potential to Achieve Goals: Fair    Frequency Min 2X/week     Co-evaluation               AM-PAC PT "6 Clicks" Mobility  Outcome Measure Help needed turning from your back to your side while in a flat bed without using bedrails?: A Little Help needed moving from lying on your back to sitting on the side of a flat bed without using bedrails?: A Little Help needed moving to and from a bed to a chair (including a wheelchair)?: A Lot Help needed standing up from a chair using your arms (e.g., wheelchair or bedside chair)?: A Little Help needed to walk in hospital room?: A Lot Help needed climbing 3-5 steps with a railing? : Total 6 Click Score: 14  End of Session Equipment Utilized During Treatment: Gait belt Activity Tolerance: Patient limited by fatigue Patient left: in chair;with call bell/phone within reach;with family/visitor present Nurse Communication: Mobility status PT Visit Diagnosis: Muscle weakness (generalized) (M62.81);Difficulty in walking, not elsewhere classified (R26.2)    Time: 1445-1510 PT Time Calculation (min) (ACUTE ONLY): 25 min   Charges:   PT Evaluation $PT Eval Low Complexity: 1 Low PT Treatments $Therapeutic Activity: 8-22 mins PT General Charges $$ ACUTE PT VISIT: 1 Visit         Malachi Pro, DPT 07/19/2022, 3:26 PM

## 2022-07-19 NOTE — Progress Notes (Addendum)
Initial Nutrition Assessment  DOCUMENTATION CODES:   Non-severe (moderate) malnutrition in context of chronic illness  INTERVENTION:   Ensure Enlive po TID, each supplement provides 350 kcal and 20 grams of protein.  Magic cup TID with meals, each supplement provides 290 kcal and 9 grams of protein  MVI po daily   Pt at high refeed risk; recommend monitor potassium, magnesium and phosphorus labs daily until stable  Recommend consideration of appetite stimulant if appropriate   NUTRITION DIAGNOSIS:   Moderate Malnutrition related to chronic illness as evidenced by mild fat depletion, mild muscle depletion, 11 percent weight loss in 7 months.  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  Met with pt and family in room today. Pt reports poor oral intake for the past several months r/t poor appetite and early satiety. Pt reports that her oral intake has varied with her cancer treatments but reports that she developed chronic diarrhea after her last radiation treatment in June and reports that ever since, her oral intake has been poor. Pt reports that she will take a few bites of something and then she just has no desire to eat the rest. Family also reports pt with poor oral intake. Pt reports that she does enjoy Ensure/Boost supplements (chocolate). Pt denies any issues with chewing or swallowing. Pt reports eating 1/2 of a muffin and a few bites of pizza today. RD discussed with pt the importance of adequate nutrition needed to preserve lean  muscle. RD will add supplements and MVI to help pt meet her estimated needs. Pt is at high refeed risk.  Per chart, pt is down 25lbs(11%) over the past 7 months; this is significant weight loss.    Medications reviewed and include: aspirin, lovenox, folic acid, synthroid, MVI, protonix, Kcl, thiamine, NaCl w/ Kcl @75ml /hr, ceftriaxone, NaPhos   Labs reviewed: Na 131(L), K 2.5(L), creat 1.80(H), P 1.3(L), Mg 2.1 wnl Hgb 10.9(L), Hct 32.5(L)  NUTRITION - FOCUSED PHYSICAL EXAM:  Flowsheet Row Most Recent Value  Orbital Region Moderate depletion  Upper Arm Region Mild depletion  Thoracic and Lumbar Region No depletion  Buccal Region Mild depletion  Temple Region Mild depletion  Clavicle Bone Region Mild depletion  Clavicle and Acromion Bone Region Mild depletion  Dorsal Hand Moderate depletion  Patellar Region Mild depletion  Anterior Thigh Region Mild depletion  Posterior Calf Region Mild depletion  Edema (RD Assessment) None  Hair Reviewed  Eyes Reviewed  Mouth Reviewed  Skin Reviewed  Nails Reviewed   Diet Order:   Diet Order             Diet regular Room service appropriate? Yes; Fluid consistency: Thin  Diet effective now                  EDUCATION NEEDS:   Education needs have been addressed  Skin:  Skin Assessment: Reviewed RN Assessment  Last BM:  7/3- type 7  Height:   Ht Readings from Last 1 Encounters:  07/18/22 5\' 6"  (1.676 m)    Weight:   Wt  Readings from Last 1 Encounters:  07/18/22 88.5 kg    Ideal Body Weight:  59 kg  BMI:  Body mass index is 31.47 kg/m.  Estimated Nutritional Needs:   Kcal:  1700-2000kcal/day  Protein:  85-100g/day  Fluid:  1.6-1.8L/day  Betsey Holiday MS, RD, LDN Please refer to [facility] for RD and/or RD on-call/weekend/after hours pager

## 2022-07-19 NOTE — Progress Notes (Signed)
Prior-To-Admission Oral Chemotherapy for Treatment of Oncologic Disease   Order noted from Dr. Myriam Forehand to continue prior-to-admission oral chemotherapy regimen of Everolimus 7.5 mg po daily.  Procedure Per Pharmacy & Therapeutics Committee Policy: Orders for continuation of home oral chemotherapy for treatment of an oncologic disease will be held unless approved by an oncologist during current admission.    For patients receiving oncology care at [facility], inpatient pharmacist contacts patient's oncologist during regular office hours to review. If earlier review is medically necessary, attending physician consults [facility] on-call oncologist   For patients receiving oncology care outside of [facility], attending physician consults patient's oncologist to review. If this oncologist or their coverage cannot be reached, attending physician consults [facility] on-call oncologist   Oral chemotherapy continuation order is on hold pending oncologist review, [facility] oncologist Dr. Gerarda Fraction will be notified by inpatient pharmacy during office hours    Barrie Folk 07/19/2022, 11:47 AM

## 2022-07-19 NOTE — Evaluation (Signed)
Occupational Therapy Evaluation Patient Details Name: Alicia [name] MRN: [id number] DOB: 1950-04-07 Today's Date: 07/19/2022   History of Present Illness Alicia [name] is a 72 y.o. 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  Family said he had not taken any of her medicines for about a week prior to admission.   Clinical Impression   Pt was seen for OT evaluation this date. Prior to hospital admission, pt was living alone with family assisting with IADLs, using cane for ambulation. Pt lives in two story house with the primary bedroom and bathroom on second level. Pt presents to acute OT demonstrating impaired ADL performance and functional mobility 2/2 decrease activity tolerance and generalized weakness. (See OT problem list for additional functional deficits). Pt reported of new onset of fatigue within the past week that has continued to increase, limiting functional mobility and performance in ADLs. UE MMT 3+/5. Pt also presents with confusion and disorientation to situation. Pt currently requires MOD A for bed mobility with HOB elevation. MIN A sit<>stand with RW. MAX A to don briefs sit<>stand. Pt and family report that pt's current presentation is not close to baseline. Pt would benefit from skilled OT services to address noted impairments and functional limitations (see below for any additional details) in order to maximize safety and independence while minimizing falls risk and caregiver burden. Anticipate the need for follow up OT services upon acute hospital DC.       Recommendations for follow up therapy are one component of a multi-disciplinary discharge planning process, led by the attending physician.  Recommendations may be updated based on patient status,  additional functional criteria and insurance authorization.   Assistance Recommended at Discharge Frequent or constant Supervision/Assistance  Patient can return home with the following A lot of help with walking and/or transfers;A lot of help with bathing/dressing/bathroom;Assistance with cooking/housework;Direct supervision/assist for medications management;Assist for transportation;Help with stairs or ramp for entrance    Functional Status Assessment  Patient has had a recent decline in their functional status and demonstrates the ability to make significant improvements in function in a reasonable and predictable amount of time.  Equipment Recommendations  BSC/3in1    Recommendations for Other Services       Precautions / Restrictions Precautions Precautions: Fall Restrictions Weight Bearing Restrictions: No      Mobility Bed Mobility Overal bed mobility: Needs Assistance Bed Mobility: Supine to Sit, Sit to Supine     Supine to sit: Mod assist, HOB elevated Sit to supine: Mod assist   General bed mobility comments: MOD A for trunk and LLE management.    Transfers Overall transfer level: Needs assistance Equipment used: Rolling walker (2 wheels) Transfers: Sit to/from Stand Sit to Stand: Min assist           General transfer comment: Verbal cueing required for hand placement with using RW while standing.      Balance Overall balance assessment: Needs assistance Sitting-balance support: Feet supported, No upper extremity supported Sitting balance-Leahy Scale: Good Sitting balance - Comments: Pt able to touch toes while seated without LOB   Standing balance support: Bilateral upper extremity supported, Reliant on assistive device for balance Standing balance-Leahy Scale: Poor Standing balance comment: Pt held onto RW for balance while standing. Tolerated <64min of standing, sat down due to fatigue  ADL either performed or  assessed with clinical judgement   ADL Overall ADL's : Needs assistance/impaired Eating/Feeding: Set up;Sitting Eating/Feeding Details (indicate cue type and reason): pt has been declining interest in eating. Set-up to decrease barriers with eating                 Lower Body Dressing: Maximal assistance;Sit to/from stand Lower Body Dressing Details (indicate cue type and reason): MAX A to don brief with RW             Functional mobility during ADLs: Minimal assistance;Moderate assistance;Rolling walker (2 wheels) General ADL Comments: Assistance needed to complete ADLs due to decrease activity tolerance and onset of fatigue/dizziness. Pt reports feeling weak throughout session.     Vision         Perception     Praxis      Pertinent Vitals/Pain Pain Assessment Pain Assessment: No/denies pain     Hand Dominance     Extremity/Trunk Assessment Upper Extremity Assessment Upper Extremity Assessment: Generalized weakness   Lower Extremity Assessment Lower Extremity Assessment: Generalized weakness       Communication Communication Communication: HOH   Cognition Arousal/Alertness: Awake/alert Behavior During Therapy: WFL for tasks assessed/performed Overall Cognitive Status: Impaired/Different from baseline Area of Impairment: Orientation, Attention, Safety/judgement, Awareness                 Orientation Level: Disoriented to, Situation Current Attention Level: Sustained     Safety/Judgement: Decreased awareness of safety           General Comments       Exercises     Shoulder Instructions      Home Living Family/patient expects to be discharged to:: Private residence Living Arrangements: Alone Available Help at Discharge: Family;Available PRN/intermittently Type of Home: House Home Access: Stairs to enter     Home Layout: Two level     Bathroom Shower/Tub: Chief Strategy Officer: Standard Bathroom Accessibility:  Yes How Accessible: Accessible via walker Home Equipment: Rolling Walker (2 wheels);Cane - single point   Additional Comments: Pt has been sleeping and bathing upstairs in home. Family reports they can arrange for her to live downstairs full time if needed.      Prior Functioning/Environment Prior Level of Function : Needs assist  Cognitive Assist : ADLs (cognitive)   ADLs (Cognitive): Set up cues         ADLs Comments: Pt has been living alone with family checking in frequently. Family completes IADLs for pt.        OT Problem List: Decreased strength;Decreased activity tolerance;Impaired balance (sitting and/or standing)      OT Treatment/Interventions: Self-care/ADL training;Therapeutic exercise;Energy conservation;Therapeutic activities    OT Goals(Current goals can be found in the care plan section) Acute Rehab OT Goals Patient Stated Goal: To increase energy OT Goal Formulation: With patient/family Time For Goal Achievement: 08/02/22 Potential to Achieve Goals: Fair ADL Goals Pt Will Perform Grooming: standing;with modified independence Pt Will Transfer to Toilet: with modified independence;bedside commode Pt Will Perform Toileting - Clothing Manipulation and hygiene: sit to/from stand;with min guard assist  OT Frequency: Min 1X/week    Co-evaluation              AM-PAC OT "6 Clicks" Daily Activity     Outcome Measure   Help from another person taking care of personal grooming?: A Little Help from another person toileting, which includes using toliet, bedpan, or urinal?: A Lot Help from another person bathing (including  washing, rinsing, drying)?: A Lot Help from another person to put on and taking off regular upper body clothing?: A Lot Help from another person to put on and taking off regular lower body clothing?: A Lot 6 Click Score: 11   End of Session Equipment Utilized During Treatment: Rolling walker (2 wheels);Oxygen Nurse Communication: Mobility  status  Activity Tolerance: Patient tolerated treatment well Patient left: in bed;with call bell/phone within reach;with nursing/sitter in room  OT Visit Diagnosis: Unsteadiness on feet (R26.81);Muscle weakness (generalized) (M62.81)                Time: 1027-2536 OT Time Calculation (min): 22 min Charges:  OT General Charges $OT Visit: 1 Visit OT Evaluation $OT Eval Moderate Complexity: 1 Mod  [address], OTS

## 2022-07-19 NOTE — Progress Notes (Addendum)
Progress Note    Alicia [name]  ZOX:[id number] DOB: [birth year]  DOA: 07/18/2022 PCP: Corky Downs, MD      Brief Narrative:    Medical records reviewed and are as summarized below:  Alicia [name] is a 72 y.o. 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  Family said he had not taken any of her medicines for about a week prior to admission.      Assessment/Plan:   Principal Problem:   Altered mental status Active Problems:   AKI (acute kidney injury) (HCC)   Electrolyte abnormality   Generalized weakness   QT prolongation   Elevated troponin   Diarrhea   Malignant neoplasm metastatic to bone (HCC)   Primary hypertension   Hypokalemia   Hypophosphatemia   Acute metabolic encephalopathy   Body mass index is 31.47 kg/m.  (Obesity)   Acute metabolic encephalopathy: Seems to be improving.  No focal deficits at this time.   AKI: Continue IV fluids and monitor BMP   Hypokalemia: Slowly improving.  Change IV Ringer's lactate infusion to normal saline with 40 mEq of potassium chloride in the bag.  Continue oral potassium chloride.   Hypophosphatemia: Phosphorus is 1.3.  Replete with IV sodium phosphate and monitor phosphorus level.   Prolonged QTc at 582.  Repeat EKG when potassium is replete.   Elevated troponin: This is likely due to demand ischemia.   Chronic diarrhea: Imodium as needed   Stage IV breast cancer with metastasis to the bone.  Outpatient follow-up with [name], oncologist.    Diet Order             Diet regular Room service appropriate? Yes; Fluid consistency: Thin  Diet effective now                            Consultants: None  Procedures: None    Medications:     ALPRAZolam  0.25 mg Oral BID   aspirin EC  81 mg Oral QHS   enoxaparin (LOVENOX) injection  30 mg Subcutaneous Q24H   everolimus  7.5 mg Oral Daily   exemestane  25 mg Oral QPC breakfast   folic acid  1 mg Oral Daily   levothyroxine  100 mcg Oral Q0600   multivitamin with minerals  1 tablet Oral Daily   pantoprazole  20 mg Oral Daily   potassium chloride  40 mEq Oral Daily   rosuvastatin  5 mg Oral Daily   sodium chloride flush  3 mL Intravenous Q12H   thiamine  100 mg Oral Daily   traZODone  50 mg Oral QHS   Continuous Infusions:  0.9 % NaCl with KCl 40 mEq / L 75 mL/hr at 07/19/22 0842   cefTRIAXone (ROCEPHIN)  IV 1 g (07/18/22 2210)   sodium phosphate 30 mmol in dextrose 5 % 250 mL infusion       Anti-infectives (From admission, onward)    Start     Dose/Rate Route Frequency Ordered Stop   07/18/22 2200  cefTRIAXone (ROCEPHIN) 1 g in sodium chloride 0.9 % 100 mL IVPB        1 g 200 mL/hr over 30 Minutes Intravenous Every 24 hours 07/18/22 2104  Family Communication/Anticipated D/C date and plan/Code Status   DVT prophylaxis: enoxaparin (LOVENOX) injection 30 mg Start: 07/18/22 2200     Code Status: Full Code  Family Communication: Plan discussed with son and daughter-in-law at the bedside Disposition Plan: Plan to discharge home in 1 to 2 days   Status is: Observation The patient will require care spanning > 2 midnights and should be moved to inpatient because: Hypokalemia, AKI, general weakness       Subjective:   Interval events noted.  She complains of general weakness.  She thinks confusion is a little better. Her son and daughter-in-law were at the bedside  Objective:    Vitals:   07/19/22 0028 07/19/22 0523 07/19/22 0724 07/19/22 1142  BP: (!) 120/58 (!) 136/93 (!) 131/52 110/61  Pulse: (!) 101 (!) 108 (!) 109 92  Resp: 18 18 18 18   Temp: (!) 97.5 F (36.4 C) 99.4 F (37.4 C) 97.9 F (36.6 C) 99.6 F (37.6 C)  TempSrc:  Oral Oral    SpO2: 97% 93% 97% 93%  Weight:      Height:       No data found.   Intake/Output Summary (Last 24 hours) at 07/19/2022 1203 Last data filed at 07/18/2022 1859 Gross per 24 hour  Intake 100 ml  Output --  Net 100 ml   Filed Weights   07/18/22 1641  Weight: 88.5 kg    Exam:  GEN: NAD SKIN: Warm and dry EYES: No pallor or icterus ENT: MMM CV: RRR PULM: CTA B ABD: soft, ND, NT, +BS CNS: AAO x 3, non focal EXT: No edema or tenderness        Data Reviewed:   I have personally reviewed following labs and imaging studies:  Labs: Labs show the following:   Basic Metabolic Panel: Recent Labs  Lab 07/18/22 1652 07/18/22 1859 07/19/22 0657  NA 127*  --  131*  K 2.2*  --  2.5*  CL 85*  --  92*  CO2 24  --  26  GLUCOSE 147*  --  113*  BUN 18  --  16  CREATININE 2.12*  --  1.80*  CALCIUM 8.6*  --  8.5*  MG  --  1.8 2.1  PHOS  --   --  1.3*   GFR Estimated Creatinine Clearance: 31.2 mL/min (A) (by C-G formula based on SCr of 1.8 mg/dL (H)). Liver Function Tests: Recent Labs  Lab 07/18/22 1652  AST 85*  ALT 44  ALKPHOS 118  BILITOT 1.3*  PROT 7.3  ALBUMIN 3.6   No results for input(s): "LIPASE", "AMYLASE" in the last 168 hours. No results for input(s): "AMMONIA" in the last 168 hours. Coagulation profile Recent Labs  Lab 07/18/22 1708  INR 1.2    CBC: Recent Labs  Lab 07/18/22 1652 07/19/22 0657  WBC 6.4 5.3  NEUTROABS 5.5  --   HGB 11.2* 10.9*  HCT 33.6* 32.5*  MCV 81.4 81.5  PLT 215 179   Cardiac Enzymes: No results for input(s): "CKTOTAL", "CKMB", "CKMBINDEX", "TROPONINI" in the last 168 hours. BNP (last 3 results) No results for input(s): "PROBNP" in the last 8760 hours. CBG: Recent Labs  Lab 07/19/22 0725  GLUCAP 104*   D-Dimer: No results for input(s): "DDIMER" in the last 72 hours. Hgb A1c: No results for input(s): "HGBA1C" in the last 72 hours. Lipid Profile: No results for input(s): "CHOL", "HDL",  "LDLCALC", "TRIG", "CHOLHDL", "LDLDIRECT" in the last 72 hours. Thyroid function studies: Recent Labs  07/18/22 1859  TSH 1.073   Anemia work up: No results for input(s): "VITAMINB12", "FOLATE", "FERRITIN", "TIBC", "IRON", "RETICCTPCT" in the last 72 hours. Sepsis Labs: Recent Labs  Lab 07/18/22 1652 07/18/22 1859 07/19/22 0657  WBC 6.4  --  5.3  LATICACIDVEN 2.7* 1.5  --     Microbiology Recent Results (from the past 240 hour(s))  Culture, blood (Routine x 2)     Status: None (Preliminary result)   Collection Time: 07/18/22  4:52 PM   Specimen: BLOOD  Result Value Ref Range Status   Specimen Description BLOOD RIGHT ANTECUBITAL  Final   Special Requests   Final    BOTTLES DRAWN AEROBIC AND ANAEROBIC Blood Culture results may not be optimal due to an excessive volume of blood received in culture bottles   Culture   Final    NO GROWTH < 24 HOURS Performed at [facility], [address] [place], [place] [address]    Report Status PENDING  Incomplete  Culture, blood (Routine x 2)     Status: None (Preliminary result)   Collection Time: 07/18/22  5:08 PM   Specimen: BLOOD  Result Value Ref Range Status   Specimen Description BLOOD BLOOD RIGHT ARM  Final   Special Requests   Final    BOTTLES DRAWN AEROBIC AND ANAEROBIC Blood Culture adequate volume   Culture   Final    NO GROWTH < 24 HOURS Performed at [facility], [address], [place], [place] [address]    Report Status PENDING  Incomplete    Procedures and diagnostic studies:  DG Chest Port 1 View  Result Date: 07/18/2022 CLINICAL DATA:  Shortness of breath.  History of breast cancer. EXAM: PORTABLE CHEST 1 VIEW COMPARISON:  X-ray 10/27/2019.  PET-CT scan 03/29/2022 FINDINGS: Overlapping cardiac leads. Normal cardiopericardial silhouette. No consolidation, pneumothorax or effusion. There is some linear opacity left lung base likely scar or atelectasis. Older rib fractures are identified. The  subtle left upper lobe opacity corresponds to the finding by CT scan of some pleural thickening and interstitial septal thickening. Previous left mastectomy. IMPRESSION: No acute cardiopulmonary disease. Chronic changes. Mild left basilar scar or atelectasis. Electronically Signed   By: Karen Kays M.D.   On: 07/18/2022 17:24               LOS: 0 days   Asencion Guisinger  [facility]   Pager on [email or web address]. If 7PM-7AM, please contact night-coverage at [email or web address]     07/19/2022, 12:03 PM

## 2022-07-20 DIAGNOSIS — C50919 Malignant neoplasm of unspecified site of unspecified female breast: Secondary | ICD-10-CM | POA: Diagnosis not present

## 2022-07-20 DIAGNOSIS — Z1611 Resistance to penicillins: Secondary | ICD-10-CM | POA: Diagnosis present

## 2022-07-20 DIAGNOSIS — L8990 Pressure ulcer of unspecified site, unspecified stage: Secondary | ICD-10-CM | POA: Insufficient documentation

## 2022-07-20 DIAGNOSIS — E876 Hypokalemia: Secondary | ICD-10-CM | POA: Diagnosis present

## 2022-07-20 DIAGNOSIS — F419 Anxiety disorder, unspecified: Secondary | ICD-10-CM | POA: Diagnosis not present

## 2022-07-20 DIAGNOSIS — N390 Urinary tract infection, site not specified: Secondary | ICD-10-CM | POA: Diagnosis present

## 2022-07-20 DIAGNOSIS — D631 Anemia in chronic kidney disease: Secondary | ICD-10-CM | POA: Diagnosis present

## 2022-07-20 DIAGNOSIS — M6259 Muscle wasting and atrophy, not elsewhere classified, multiple sites: Secondary | ICD-10-CM | POA: Diagnosis not present

## 2022-07-20 DIAGNOSIS — G4700 Insomnia, unspecified: Secondary | ICD-10-CM | POA: Diagnosis not present

## 2022-07-20 DIAGNOSIS — L89152 Pressure ulcer of sacral region, stage 2: Secondary | ICD-10-CM | POA: Diagnosis present

## 2022-07-20 DIAGNOSIS — Z741 Need for assistance with personal care: Secondary | ICD-10-CM | POA: Diagnosis not present

## 2022-07-20 DIAGNOSIS — Z6832 Body mass index (BMI) 32.0-32.9, adult: Secondary | ICD-10-CM | POA: Diagnosis not present

## 2022-07-20 DIAGNOSIS — R918 Other nonspecific abnormal finding of lung field: Secondary | ICD-10-CM | POA: Diagnosis not present

## 2022-07-20 DIAGNOSIS — J189 Pneumonia, unspecified organism: Secondary | ICD-10-CM | POA: Diagnosis not present

## 2022-07-20 DIAGNOSIS — A419 Sepsis, unspecified organism: Secondary | ICD-10-CM | POA: Diagnosis not present

## 2022-07-20 DIAGNOSIS — N3000 Acute cystitis without hematuria: Secondary | ICD-10-CM | POA: Diagnosis not present

## 2022-07-20 DIAGNOSIS — Z79811 Long term (current) use of aromatase inhibitors: Secondary | ICD-10-CM | POA: Diagnosis not present

## 2022-07-20 DIAGNOSIS — G4733 Obstructive sleep apnea (adult) (pediatric): Secondary | ICD-10-CM | POA: Diagnosis not present

## 2022-07-20 DIAGNOSIS — J159 Unspecified bacterial pneumonia: Secondary | ICD-10-CM | POA: Diagnosis present

## 2022-07-20 DIAGNOSIS — J99 Respiratory disorders in diseases classified elsewhere: Secondary | ICD-10-CM | POA: Diagnosis not present

## 2022-07-20 DIAGNOSIS — E569 Vitamin deficiency, unspecified: Secondary | ICD-10-CM | POA: Diagnosis not present

## 2022-07-20 DIAGNOSIS — R4182 Altered mental status, unspecified: Secondary | ICD-10-CM | POA: Diagnosis not present

## 2022-07-20 DIAGNOSIS — E440 Moderate protein-calorie malnutrition: Secondary | ICD-10-CM | POA: Insufficient documentation

## 2022-07-20 DIAGNOSIS — I2489 Other forms of acute ischemic heart disease: Secondary | ICD-10-CM | POA: Diagnosis not present

## 2022-07-20 DIAGNOSIS — L89153 Pressure ulcer of sacral region, stage 3: Secondary | ICD-10-CM | POA: Diagnosis not present

## 2022-07-20 DIAGNOSIS — N1832 Chronic kidney disease, stage 3b: Secondary | ICD-10-CM | POA: Diagnosis present

## 2022-07-20 DIAGNOSIS — Z515 Encounter for palliative care: Secondary | ICD-10-CM | POA: Diagnosis not present

## 2022-07-20 DIAGNOSIS — E871 Hypo-osmolality and hyponatremia: Secondary | ICD-10-CM | POA: Diagnosis present

## 2022-07-20 DIAGNOSIS — I129 Hypertensive chronic kidney disease with stage 1 through stage 4 chronic kidney disease, or unspecified chronic kidney disease: Secondary | ICD-10-CM | POA: Diagnosis present

## 2022-07-20 DIAGNOSIS — G9341 Metabolic encephalopathy: Secondary | ICD-10-CM | POA: Diagnosis present

## 2022-07-20 DIAGNOSIS — E039 Hypothyroidism, unspecified: Secondary | ICD-10-CM | POA: Diagnosis present

## 2022-07-20 DIAGNOSIS — E785 Hyperlipidemia, unspecified: Secondary | ICD-10-CM | POA: Diagnosis not present

## 2022-07-20 DIAGNOSIS — E669 Obesity, unspecified: Secondary | ICD-10-CM | POA: Diagnosis present

## 2022-07-20 DIAGNOSIS — N179 Acute kidney failure, unspecified: Secondary | ICD-10-CM | POA: Diagnosis present

## 2022-07-20 DIAGNOSIS — I4581 Long QT syndrome: Secondary | ICD-10-CM | POA: Diagnosis not present

## 2022-07-20 DIAGNOSIS — R410 Disorientation, unspecified: Secondary | ICD-10-CM | POA: Diagnosis not present

## 2022-07-20 DIAGNOSIS — D61818 Other pancytopenia: Secondary | ICD-10-CM | POA: Diagnosis present

## 2022-07-20 DIAGNOSIS — K219 Gastro-esophageal reflux disease without esophagitis: Secondary | ICD-10-CM | POA: Diagnosis not present

## 2022-07-20 DIAGNOSIS — Z86711 Personal history of pulmonary embolism: Secondary | ICD-10-CM | POA: Diagnosis not present

## 2022-07-20 DIAGNOSIS — C7951 Secondary malignant neoplasm of bone: Secondary | ICD-10-CM | POA: Diagnosis present

## 2022-07-20 DIAGNOSIS — R5383 Other fatigue: Secondary | ICD-10-CM | POA: Diagnosis not present

## 2022-07-20 DIAGNOSIS — E860 Dehydration: Secondary | ICD-10-CM | POA: Diagnosis present

## 2022-07-20 DIAGNOSIS — Z79890 Hormone replacement therapy (postmenopausal): Secondary | ICD-10-CM | POA: Diagnosis not present

## 2022-07-20 DIAGNOSIS — J9811 Atelectasis: Secondary | ICD-10-CM | POA: Diagnosis not present

## 2022-07-20 DIAGNOSIS — R41841 Cognitive communication deficit: Secondary | ICD-10-CM | POA: Diagnosis not present

## 2022-07-20 DIAGNOSIS — I10 Essential (primary) hypertension: Secondary | ICD-10-CM | POA: Diagnosis not present

## 2022-07-20 LAB — BASIC METABOLIC PANEL
Anion gap: 11 (ref 5–15)
BUN: 13 mg/dL (ref 8–23)
CO2: 26 mmol/L (ref 22–32)
Calcium: 8 mg/dL — ABNORMAL LOW (ref 8.9–10.3)
Chloride: 101 mmol/L (ref 98–111)
Creatinine, Ser: 1.63 mg/dL — ABNORMAL HIGH (ref 0.44–1.00)
GFR, Estimated: 33 mL/min — ABNORMAL LOW (ref >60)
Glucose, Bld: 109 mg/dL — ABNORMAL HIGH (ref 70–99)
Potassium: 2.8 mmol/L — ABNORMAL LOW (ref 3.5–5.1)
Sodium: 136 mmol/L (ref 135–145)

## 2022-07-20 LAB — MAGNESIUM: Magnesium: 1.8 mg/dL (ref 1.7–2.4)

## 2022-07-20 LAB — URINE CULTURE

## 2022-07-20 LAB — PHOSPHORUS: Phosphorus: 2.8 mg/dL (ref 2.5–4.6)

## 2022-07-20 MED ORDER — TRAMADOL HCL 50 MG PO TABS
50.0000 mg | ORAL_TABLET | Freq: Four times a day (QID) | ORAL | Status: DC | PRN
  Administered 2022-07-26: 50 mg via ORAL
  Filled 2022-07-20: qty 1

## 2022-07-20 MED ORDER — POTASSIUM CHLORIDE IN NACL 40-0.9 MEQ/L-% IV SOLN: INTRAVENOUS | Status: DC

## 2022-07-20 MED ORDER — POTASSIUM CHLORIDE IN NACL 40-0.9 MEQ/L-% IV SOLN
INTRAVENOUS | Status: AC
  Filled 2022-07-20 (×2): qty 1000

## 2022-07-20 MED ORDER — POTASSIUM CHLORIDE CRYS ER 20 MEQ PO TBCR
40.0000 meq | EXTENDED_RELEASE_TABLET | Freq: Every day | ORAL | Status: DC
  Administered 2022-07-21 – 2022-07-25 (×5): 40 meq via ORAL
  Filled 2022-07-20 (×6): qty 2

## 2022-07-20 MED ORDER — LOPERAMIDE HCL 2 MG PO CAPS
2.0000 mg | ORAL_CAPSULE | Freq: Four times a day (QID) | ORAL | Status: DC | PRN
  Administered 2022-07-20: 2 mg via ORAL
  Filled 2022-07-20: qty 1

## 2022-07-20 MED ORDER — POTASSIUM CHLORIDE 20 MEQ PO PACK
40.0000 meq | PACK | ORAL | Status: AC
  Administered 2022-07-20 (×2): 40 meq via ORAL
  Filled 2022-07-20 (×2): qty 2

## 2022-07-20 MED ORDER — SALINE SPRAY 0.65 % NA SOLN
1.0000 | NASAL | Status: DC | PRN
  Filled 2022-07-20: qty 44

## 2022-07-20 NOTE — Care Management Obs Status (Signed)
MEDICARE OBSERVATION STATUS NOTIFICATION   Patient Details  Name: Alicia [name] MRN: [id number] Date of Birth: 12/20/1950   Medicare Observation Status Notification Given:  Yes    Reathel Turi, LCSW 07/20/2022, 10:42 AM

## 2022-07-20 NOTE — TOC Initial Note (Signed)
Transition of Care [facility]) - Initial/Assessment Note    Patient Details  Name: Alicia [name] MRN: [id number] Date of Birth: 11/10/1950  Transition of Care [facility]) CM/SW Contact:    Allena Katz, LCSW Phone Number: 07/20/2022, 11:30 AM  Clinical Narrative:      CSW spoke with family regarding SNF placement at time of encounter pt was in OBS and unable to go to SNF due to this. Alternative options discussed. Per family, pt was independent at baseline and was walking with a walker. Pt has a [employer] supplement that family are unsure if covers LTC and aide assistance but would like [place] to reach out to a local agency to see if this is covered. Family feels if pt cannot go to SNF they would be interested in aide resources. They report pt has hoarded quite a bit but state the home is free of any environmental hazard and they are working on getting it cleared out. Family agreeable to referral being made to always [facility]. Choice of agency was offered to family and no preference from family. Since pt is now inpatient family would like to pursue the option of SNF. CSW waiting to hear back from son on preference.                    Patient Goals and CMS Choice            Expected Discharge Plan and Services                                              Prior Living Arrangements/Services     Patient language and need for interpreter reviewed:: Yes        Need for Family Participation in Patient Care: Yes (Comment) Care giver support system in place?: Yes (comment)      Activities of Daily Living Home Assistive Devices/Equipment: None ADL Screening (condition at time of admission) Patient's cognitive ability adequate to safely complete daily activities?: Yes Is the patient deaf or have difficulty hearing?: No Does the patient have difficulty seeing, even when wearing glasses/contacts?: No Does the patient have difficulty concentrating, remembering, or making  decisions?: No Patient able to express need for assistance with ADLs?: Yes Does the patient have difficulty dressing or bathing?: No Independently performs ADLs?: No Communication: Independent Dressing (OT): Needs assistance Is this a change from baseline?: Pre-admission baseline Grooming: Independent Feeding: Independent Bathing: Independent Toileting: Needs assistance Is this a change from baseline?: Pre-admission baseline Walks in Home: Needs assistance Is this a change from baseline?: Pre-admission baseline Does the patient have difficulty walking or climbing stairs?: Yes Weakness of Legs: Both Weakness of Arms/Hands: None  Permission Sought/Granted            Permission granted to share info w Relationship: ABC  Permission granted to share info w Contact Information: ABC, SNF'S  Emotional Assessment       Orientation: : Oriented to Self, Oriented to  Time, Oriented to Situation, Oriented to Place      Admission diagnosis:  Dehydration [E86.0] Hypokalemia [E87.6] Hyponatremia [E87.1] Altered mental status [R41.82] Generalized weakness [R53.1] Acute kidney injury (HCC) [N17.9] Altered mental status, unspecified altered mental status type [R41.82] Patient Active Problem List   Diagnosis Date Noted   Malnutrition of moderate degree 07/20/2022   Hypokalemia 07/19/2022   Hypophosphatemia 07/19/2022   Acute  metabolic encephalopathy 07/19/2022   Generalized weakness 07/18/2022   AKI (acute kidney injury) (HCC) 07/18/2022   Electrolyte abnormality 07/18/2022   QT prolongation 07/18/2022   Diarrhea 07/18/2022   Altered mental status 07/18/2022   Contact dermatitis 05/10/2021   Hematuria 03/31/2021   Urinary tract infection without hematuria 03/01/2021   PSVT (paroxysmal supraventricular tachycardia) 02/20/2020   Mobitz type 1 second degree AV block 02/20/2020   Pure hypercholesterolemia 02/20/2020   Daytime somnolence 02/18/2020   Chest pain 11/13/2019    Palpitations 11/13/2019   Obesity (BMI 35.0-39.9 without comorbidity) 11/08/2019   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) 09/27/2019   Anxiety 08/17/2019   Primary hypertension 08/17/2019   Gastroesophageal reflux disease without esophagitis 08/17/2019   Malignant neoplasm metastatic to bone (HCC) 08/06/2019   Tachycardia 06/18/2019   Pulmonary embolism (HCC) 01/11/2018   Sepsis (HCC) 10/05/2017   Breast cancer, stage 2, left (HCC) 07/24/2017   Elevated troponin 02/17/2015   PCP:  Corky Downs, MD Pharmacy:   [id number] [place],  - [address] AT [facility] [address] [place] [address] Phone: [phone] Fax: [phone]  [place] [facility] [address] [place] [address] Phone: [phone] Fax: [phone]     Social Determinants of Health (SDOH) Social History: SDOH Screenings   Food Insecurity: No Food Insecurity (07/18/2022)  Housing: Low Risk  (07/18/2022)  Transportation Needs: No Transportation Needs (07/18/2022)  Utilities: Not At Risk (07/18/2022)  Alcohol Screen: Low Risk  (03/09/2022)  Depression (PHQ2-9): Low Risk  (03/09/2022)  Financial Resource Strain: High Risk (03/09/2022)  Physical Activity: Insufficiently Active (03/09/2022)  Social Connections: Socially Isolated (03/09/2022)  Stress: No Stress Concern Present (03/09/2022)  Tobacco Use: Medium Risk (07/18/2022)   SDOH Interventions:     Readmission Risk Interventions     No data to display

## 2022-07-20 NOTE — Progress Notes (Signed)
Occupational Therapy Treatment Patient Details Name: Alicia [name] MRN: [id number] DOB: 04-11-1950 Today's Date: 07/20/2022   History of present illness Alicia [name] is a 72 y.o. 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  Family said he had not taken any of her medicines for about a week prior to admission.   OT comments  Chart reviewed, pt greeted in bed with son/DIL present, agreeable to OT tx session. Pt is oriented to self and place, increased time required for processing throughout. Tx session targeted improving ADL participation via task oriented training. Pt requires slightly increased assist and time as compared to evaluation. Pt is reporting chills, HR is 117 sitting on edge of bed. NT/RN in room to assess. Pt continues to perform ADL/functional mobility below PLOF, OT will continue to follow acutely.    Recommendations for follow up therapy are one component of a multi-disciplinary discharge planning process, led by the attending physician.  Recommendations may be updated based on patient status, additional functional criteria and insurance authorization.    Assistance Recommended at Discharge Frequent or constant Supervision/Assistance  Patient can return home with the following  A lot of help with walking and/or transfers;A lot of help with bathing/dressing/bathroom;Assistance with cooking/housework;Direct supervision/assist for medications management;Assist for transportation;Help with stairs or ramp for entrance   Equipment Recommendations  BSC/3in1    Recommendations for Other Services      Precautions / Restrictions Precautions Precautions: Fall Restrictions Weight Bearing Restrictions: No       Mobility Bed Mobility Overal bed mobility:  Needs Assistance Bed Mobility: Supine to Sit     Supine to sit: Mod assist, HOB elevated     General bed mobility comments: step by step verbal and tactile cues required    Transfers Overall transfer level: Needs assistance Equipment used: Rolling walker (2 wheels) Transfers: Sit to/from Stand Sit to Stand: Min assist, Mod assist                 Balance Overall balance assessment: Needs assistance Sitting-balance support: Feet supported, No upper extremity supported Sitting balance-Leahy Scale: Good     Standing balance support: Bilateral upper extremity supported, Reliant on assistive device for balance Standing balance-Leahy Scale: Poor                             ADL either performed or assessed with clinical judgement   ADL Overall ADL's : Needs assistance/impaired                 Upper Body Dressing : Moderate assistance Upper Body Dressing Details (indicate cue type and reason): anticipate Lower Body Dressing: Maximal assistance;Sit to/from stand   Toilet Transfer: Minimal assistance;Rolling walker (2 wheels) Toilet Transfer Details (indicate cue type and reason): short amb transfer to bedsdie chair with step by step verbal cues Toileting- Clothing Manipulation and Hygiene: Maximal assistance       Functional mobility during ADLs: Minimal assistance;Moderate assistance;Rolling walker (2 wheels) (short amb transfer)      Extremity/Trunk Assessment              Vision       Perception     Praxis      Cognition Arousal/Alertness: Awake/alert Behavior During Therapy: Flat affect Overall Cognitive Status: Impaired/Different from  baseline Area of Impairment: Orientation, Attention, Safety/judgement, Awareness, Memory, Following commands, Problem solving                 Orientation Level: Disoriented to, Time, Situation Current Attention Level: Focused Memory: Decreased short-term memory, Decreased recall of  precautions Following Commands: Follows one step commands with increased time Safety/Judgement: Decreased awareness of safety, Decreased awareness of deficits Awareness: Intellectual Problem Solving: Slow processing, Decreased initiation, Difficulty sequencing, Requires verbal cues, Requires tactile cues General Comments: family reports worsening cognition as compared to yesterday        Exercises      Shoulder Instructions       General Comments NT in room to take vitals at end of session, RN in room after vitals taken; continues to endorse fatigue    Pertinent Vitals/ Pain       Pain Assessment Pain Assessment: PAINAD Breathing: occasional labored breathing, short period of hyperventilation Negative Vocalization: occasional moan/groan, low speech, negative/disapproving quality Facial Expression: sad, frightened, frown Body Language: tense, distressed pacing, fidgeting Consolability: distracted or reassured by voice/touch PAINAD Score: 5 Pain Intervention(s): Repositioned, Monitored during session (generalized, LLE)  Home Living                                          Prior Functioning/Environment              Frequency  Min 1X/week        Progress Toward Goals  OT Goals(current goals can now be found in the care plan section)  Progress towards OT goals: Progressing toward goals     Plan Discharge plan remains appropriate    Co-evaluation                 AM-PAC OT "6 Clicks" Daily Activity     Outcome Measure   Help from another person eating meals?: A Little Help from another person taking care of personal grooming?: A Little Help from another person toileting, which includes using toliet, bedpan, or urinal?: A Lot Help from another person bathing (including washing, rinsing, drying)?: A Lot Help from another person to put on and taking off regular upper body clothing?: A Lot Help from another person to put on and taking off  regular lower body clothing?: A Lot 6 Click Score: 14    End of Session Equipment Utilized During Treatment: Rolling walker (2 wheels);Oxygen  OT Visit Diagnosis: Unsteadiness on feet (R26.81);Muscle weakness (generalized) (M62.81)   Activity Tolerance Patient limited by fatigue   Patient Left in bed;with call bell/phone within reach;with chair alarm set;with family/visitor present   Nurse Communication Mobility status        Time: 1610-9604 OT Time Calculation (min): 21 min  Charges: OT General Charges $OT Visit: 1 Visit OT Treatments $Self Care/Home Management : 8-22 mins Alicia [name], OTD OTR/L  07/20/22, 12:54 PM

## 2022-07-20 NOTE — Progress Notes (Addendum)
Progress Note    Alicia [name]  ZOX:[id number] DOB: 1950/04/20  DOA: 07/18/2022 PCP: Corky Downs, MD      Brief Narrative:    Medical records reviewed and are as summarized below:  Alicia [name] is a 72 y.o. 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  Family said he had not taken any of her medicines for about a week prior to admission.      Assessment/Plan:   Principal Problem:   Altered mental status Active Problems:   AKI (acute kidney injury) (HCC)   Electrolyte abnormality   Generalized weakness   QT prolongation   Elevated troponin   Diarrhea   Malignant neoplasm metastatic to bone (HCC)   Primary hypertension   Hypokalemia   Hypophosphatemia   Acute metabolic encephalopathy   Malnutrition of moderate degree   Body mass index is 31.47 kg/m.  (Obesity)   Acute metabolic encephalopathy: Improved.    Acute UTI: Continue IV ceftriaxone.  Urine culture is growing Klebsiella oxytoca.  Follow-up sensitivity report.  No growth on blood cultures.   AKI: Improving.  Continue IV fluids and monitor BMP.   Hypokalemia: Slowly improving.  Continue potassium repletion (by IV and PO route) and monitor levels.    Hypophosphatemia: Improved   Prolonged QTc at 582.  Repeat EKG when potassium is replete.   Elevated troponin: This is likely due to demand ischemia.   Chronic diarrhea: Imodium as needed   Stage IV breast cancer with metastasis to the bone.  Outpatient follow-up with [name], oncologist. Per [name], okay to suspend chemotherapy so she can go to SNF for rehab (discussed via secure chat).   Stage II coccygeal decubitus ulcer (present on admission): Continue local wound care.  Diet Order              Diet regular Room service appropriate? Yes; Fluid consistency: Thin  Diet effective now                            Consultants: None  Procedures: None    Medications:    ALPRAZolam  0.25 mg Oral BID   aspirin EC  81 mg Oral QHS   enoxaparin (LOVENOX) injection  40 mg Subcutaneous Q24H   exemestane  25 mg Oral QPC breakfast   feeding supplement  237 mL Oral TID BM   folic acid  1 mg Oral Daily   levothyroxine  100 mcg Oral Q0600   multivitamin with minerals  1 tablet Oral Daily   pantoprazole  20 mg Oral Daily   potassium chloride  40 mEq Oral Daily   rosuvastatin  5 mg Oral Daily   sodium chloride flush  3 mL Intravenous Q12H   thiamine  100 mg Oral Daily   traZODone  50 mg Oral QHS   Continuous Infusions:  0.9 % NaCl with KCl 40 mEq / L 75 mL/hr at 07/19/22 0842   cefTRIAXone (ROCEPHIN)  IV 1 g (07/19/22 2219)     Anti-infectives (From admission, onward)    Start     Dose/Rate Route Frequency Ordered Stop   07/18/22 2200  cefTRIAXone (ROCEPHIN) 1 g in sodium chloride 0.9 % 100 mL IVPB        1 g 200  mL/hr over 30 Minutes Intravenous Every 24 hours 07/18/22 2104                Family Communication/Anticipated D/C date and plan/Code Status   DVT prophylaxis: enoxaparin (LOVENOX) injection 40 mg Start: 07/19/22 2200     Code Status: Full Code  Family Communication: None Disposition Plan: Plan to discharge to SNF in 2 to 3 days   Status is: Inpatient Remains inpatient appropriate because: Hypokalemia, acute UTI       Subjective:   Interval events noted.  She complains of general weakness, lower abdominal pain and diarrhea.  She said she had about 3 loose stools overnight.   Objective:    Vitals:   07/19/22 1142 07/19/22 1933 07/20/22 0029 07/20/22 0507  BP: 110/61 (!) 105/53 (!) 105/59 (!) 116/59  Pulse: 92 (!) 104 91 88  Resp: 18 17 18 18   Temp: 99.6 F (37.6 C) 98.8 F (37.1 C) 98.9 F (37.2 C) 98.5 F (36.9 C)   TempSrc:  Oral Oral Oral  SpO2: 93% 94% 93% 94%  Weight:      Height:       No data found.   Intake/Output Summary (Last 24 hours) at 07/20/2022 0738 Last data filed at 07/19/2022 1514 Gross per 24 hour  Intake 621.19 ml  Output --  Net 621.19 ml   Filed Weights   07/18/22 1641  Weight: 88.5 kg    Exam:  GEN: NAD SKIN: Warm and dry EYES: Anicteric ENT: MMM CV: RRR PULM: CTA B ABD: soft, ND, mild suprapubic tenderness, no rebound tenderness or guarding, +BS CNS: AAO x 3, non focal EXT: No edema or tenderness     Pressure Injury 07/18/22 Coccyx Anterior Stage 2 -  Partial thickness loss of dermis presenting as a shallow open injury with a red, pink wound bed without slough. (Active)  07/18/22 2130  Location: Coccyx  Location Orientation: Anterior  Staging: Stage 2 -  Partial thickness loss of dermis presenting as a shallow open injury with a red, pink wound bed without slough.  Wound Description (Comments):   Present on Admission: Yes  Dressing Type Foam - Lift dressing to assess site every shift 07/19/22 1933     Data Reviewed:   I have personally reviewed following labs and imaging studies:  Labs: Labs show the following:   Basic Metabolic Panel: Recent Labs  Lab 07/18/22 1652 07/18/22 1859 07/19/22 0657 07/20/22 0416  NA 127*  --  131* 136  K 2.2*  --  2.5* 2.8*  CL 85*  --  92* 101  CO2 24  --  26 26  GLUCOSE 147*  --  113* 109*  BUN 18  --  16 13  CREATININE 2.12*  --  1.80* 1.63*  CALCIUM 8.6*  --  8.5* 8.0*  MG  --  1.8 2.1 1.8  PHOS  --   --  1.3* 2.8   GFR Estimated Creatinine Clearance: 34.5 mL/min (A) (by C-G formula based on SCr of 1.63 mg/dL (H)). Liver Function Tests: Recent Labs  Lab 07/18/22 1652  AST 85*  ALT 44  ALKPHOS 118  BILITOT 1.3*  PROT 7.3  ALBUMIN 3.6   No results for input(s): "LIPASE", "AMYLASE" in the last 168 hours. No results for input(s): "AMMONIA" in the last 168 hours. Coagulation profile Recent Labs   Lab 07/18/22 1708  INR 1.2    CBC: Recent Labs  Lab 07/18/22 1652 07/19/22 0657  WBC 6.4 5.3  NEUTROABS 5.5  --  HGB 11.2* 10.9*  HCT 33.6* 32.5*  MCV 81.4 81.5  PLT 215 179   Cardiac Enzymes: No results for input(s): "CKTOTAL", "CKMB", "CKMBINDEX", "TROPONINI" in the last 168 hours. BNP (last 3 results) No results for input(s): "PROBNP" in the last 8760 hours. CBG: Recent Labs  Lab 07/19/22 0725  GLUCAP 104*   D-Dimer: No results for input(s): "DDIMER" in the last 72 hours. Hgb A1c: No results for input(s): "HGBA1C" in the last 72 hours. Lipid Profile: No results for input(s): "CHOL", "HDL", "LDLCALC", "TRIG", "CHOLHDL", "LDLDIRECT" in the last 72 hours. Thyroid function studies: Recent Labs    07/18/22 1859  TSH 1.073   Anemia work up: No results for input(s): "VITAMINB12", "FOLATE", "FERRITIN", "TIBC", "IRON", "RETICCTPCT" in the last 72 hours. Sepsis Labs: Recent Labs  Lab 07/18/22 1652 07/18/22 1859 07/19/22 0657  WBC 6.4  --  5.3  LATICACIDVEN 2.7* 1.5  --     Microbiology Recent Results (from the past 240 hour(s))  Culture, blood (Routine x 2)     Status: None (Preliminary result)   Collection Time: 07/18/22  4:52 PM   Specimen: BLOOD  Result Value Ref Range Status   Specimen Description BLOOD RIGHT ANTECUBITAL  Final   Special Requests   Final    BOTTLES DRAWN AEROBIC AND ANAEROBIC Blood Culture results may not be optimal due to an excessive volume of blood received in culture bottles   Culture   Final    NO GROWTH 2 DAYS Performed at [facility], [address], [place], [place] [address]    Report Status PENDING  Incomplete  Culture, blood (Routine x 2)     Status: None (Preliminary result)   Collection Time: 07/18/22  5:08 PM   Specimen: BLOOD  Result Value Ref Range Status   Specimen Description BLOOD BLOOD RIGHT ARM  Final   Special Requests   Final    BOTTLES DRAWN AEROBIC AND ANAEROBIC Blood Culture adequate volume    Culture   Final    NO GROWTH 2 DAYS Performed at [facility], [address], [place], [place] [address]    Report Status PENDING  Incomplete    Procedures and diagnostic studies:  DG Chest Port 1 View  Result Date: 07/18/2022 CLINICAL DATA:  Shortness of breath.  History of breast cancer. EXAM: PORTABLE CHEST 1 VIEW COMPARISON:  X-ray 10/27/2019.  PET-CT scan 03/29/2022 FINDINGS: Overlapping cardiac leads. Normal cardiopericardial silhouette. No consolidation, pneumothorax or effusion. There is some linear opacity left lung base likely scar or atelectasis. Older rib fractures are identified. The subtle left upper lobe opacity corresponds to the finding by CT scan of some pleural thickening and interstitial septal thickening. Previous left mastectomy. IMPRESSION: No acute cardiopulmonary disease. Chronic changes. Mild left basilar scar or atelectasis. Electronically Signed   By: Karen Kays M.D.   On: 07/18/2022 17:24               LOS: 0 days   Yehonatan Grandison  [facility]   Pager on [email or web address]. If 7PM-7AM, please contact night-coverage at [email or web address]     07/20/2022, 7:38 AM

## 2022-07-20 NOTE — [place] FL2 (Signed)
[place] MEDICAID FL2 LEVEL OF CARE FORM     IDENTIFICATION  Patient Name: Alicia [name] Birthdate: 1950/10/26 Sex: female Admission Date (Current Location): 07/18/2022  [place] and [place] Number:  Chiropodist and Address:  [facility], [address] [place], [place] [address]      Provider Number: [id number]  Attending Physician Name and Address:  Lurene Shadow, MD  Relative Name and Phone Number:       Current Level of Care: Hospital Recommended Level of Care: Skilled Nursing Facility Prior Approval Number:    Date Approved/Denied:   PASRR Number: [id number]  Discharge Plan: SNF    Current Diagnoses: Patient Active Problem List   Diagnosis Date Noted   Malnutrition of moderate degree 07/20/2022   Hypokalemia 07/19/2022   Hypophosphatemia 07/19/2022   Acute metabolic encephalopathy 07/19/2022   Generalized weakness 07/18/2022   AKI (acute kidney injury) (HCC) 07/18/2022   Electrolyte abnormality 07/18/2022   QT prolongation 07/18/2022   Diarrhea 07/18/2022   Altered mental status 07/18/2022   Contact dermatitis 05/10/2021   Hematuria 03/31/2021   Urinary tract infection without hematuria 03/01/2021   PSVT (paroxysmal supraventricular tachycardia) 02/20/2020   Mobitz type 1 second degree AV block 02/20/2020   Pure hypercholesterolemia 02/20/2020   Daytime somnolence 02/18/2020   Chest pain 11/13/2019   Palpitations 11/13/2019   Obesity (BMI 35.0-39.9 without comorbidity) 11/08/2019   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) 09/27/2019   Anxiety 08/17/2019   Primary hypertension 08/17/2019   Gastroesophageal reflux disease without esophagitis 08/17/2019   Malignant neoplasm metastatic to bone (HCC) 08/06/2019   Tachycardia 06/18/2019   Pulmonary embolism (HCC) 01/11/2018   Sepsis (HCC) 10/05/2017   Breast cancer, stage 2, left (HCC) 07/24/2017   Elevated troponin 02/17/2015    Orientation RESPIRATION  BLADDER Height & Weight     Self, Time, Situation, Place  Normal Incontinent, External catheter Weight: 191 lb 9.3 oz (86.9 kg) Height:  5\' 6"  (167.6 cm)  BEHAVIORAL SYMPTOMS/MOOD NEUROLOGICAL BOWEL NUTRITION STATUS      Continent Diet  AMBULATORY STATUS COMMUNICATION OF NEEDS Skin   Extensive Assist Verbally  (PI stage 2 to coccyx)                       Personal Care Assistance Level of Assistance  Bathing, Feeding, Dressing Bathing Assistance: Maximum assistance Feeding assistance: Limited assistance Dressing Assistance: Maximum assistance     Functional Limitations Info  Sight, Hearing, Speech Sight Info: Adequate Hearing Info: Adequate Speech Info: Adequate    SPECIAL CARE FACTORS FREQUENCY                       Contractures Contractures Info: Not present    Additional Factors Info  Code Status, Allergies Code Status Info: FULL Allergies Info: Sulfa Antibiotics           Current Medications (07/20/2022):  This is the current hospital active medication list Current Facility-Administered Medications  Medication Dose Route Frequency Provider Last Rate Last Admin   0.9 % NaCl with KCl 40 mEq / L  infusion   Intravenous Continuous Lurene Shadow, MD 75 mL/hr at 07/20/22 0949 New Bag at 07/20/22 0949   acetaminophen (TYLENOL) tablet 650 mg  650 mg Oral Q6H PRN Verdene Lennert, MD   650 mg at 07/20/22 1123   Or   acetaminophen (TYLENOL) suppository 650 mg  650 mg Rectal Q6H PRN Verdene Lennert, MD  ALPRAZolam Prudy Feeler) tablet 0.25 mg  0.25 mg Oral BID Verdene Lennert, MD   0.25 mg at 07/20/22 0805   aspirin EC tablet 81 mg  81 mg Oral QHS Verdene Lennert, MD   81 mg at 07/19/22 2214   cefTRIAXone (ROCEPHIN) 1 g in sodium chloride 0.9 % 100 mL IVPB  1 g Intravenous Q24H Verdene Lennert, MD 200 mL/hr at 07/19/22 2219 1 g at 07/19/22 2219   enoxaparin (LOVENOX) injection 40 mg  40 mg Subcutaneous Q24H Mila Merry A, RPH   40 mg at 07/19/22 2220   exemestane  (AROMASIN) tablet 25 mg  25 mg Oral QPC breakfast Lurene Shadow, MD   25 mg at 07/20/22 0805   feeding supplement (ENSURE ENLIVE / ENSURE PLUS) liquid 237 mL  237 mL Oral TID BM Lurene Shadow, MD   237 mL at 07/19/22 2220   folic acid (FOLVITE) tablet 1 mg  1 mg Oral Daily Verdene Lennert, MD   1 mg at 07/20/22 0805   HYDROcodone-acetaminophen (NORCO/VICODIN) 5-325 MG per tablet 1 tablet  1 tablet Oral Q8H PRN Verdene Lennert, MD       levothyroxine (SYNTHROID) tablet 100 mcg  100 mcg Oral Q0600 Verdene Lennert, MD   100 mcg at 07/20/22 0542   loperamide (IMODIUM) capsule 2 mg  2 mg Oral QID PRN Lurene Shadow, MD       multivitamin with minerals tablet 1 tablet  1 tablet Oral Daily Verdene Lennert, MD   1 tablet at 07/20/22 0805   pantoprazole (PROTONIX) EC tablet 20 mg  20 mg Oral Daily Verdene Lennert, MD   20 mg at 07/20/22 0805   potassium chloride (KLOR-CON) packet 40 mEq  40 mEq Oral Q4H Lurene Shadow, MD   40 mEq at 07/20/22 0804   [START ON 07/21/2022] potassium chloride SA (KLOR-CON M) CR tablet 40 mEq  40 mEq Oral Daily Lurene Shadow, MD       rosuvastatin (CRESTOR) tablet 5 mg  5 mg Oral Daily Lurene Shadow, MD   5 mg at 07/20/22 0805   sodium chloride flush (NS) 0.9 % injection 3 mL  3 mL Intravenous Q12H Verdene Lennert, MD   3 mL at 07/19/22 2220   thiamine (VITAMIN B1) tablet 100 mg  100 mg Oral Daily Verdene Lennert, MD   100 mg at 07/20/22 0805   traZODone (DESYREL) tablet 50 mg  50 mg Oral QHS Lurene Shadow, MD   50 mg at 07/19/22 2214     Discharge Medications: Please see discharge summary for a list of discharge medications.  Relevant Imaging Results:  Relevant Lab Results:   Additional Information SS# [id number]  Allena Katz, LCSW

## 2022-07-20 NOTE — Progress Notes (Signed)
Physical Therapy Treatment Patient Details Name: Alicia [name] MRN: [id number] DOB: 1950-09-10 Today's Date: 07/20/2022   History of Present Illness Alicia [name] is a 72 y.o. 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  Family said she had not taken any of her medicines for about a week prior to admission.    PT Comments  Patient received in bed, she is agreeable to PT session. Family in room. Patient is mod I with supine to sit. Use of bed rails and increased time. She is able to stand with min A, able to go about 5 feet side stepping at edge of bed min A. Patient easily fatigued with mobility. O2 sats 93-95% throughout session on 1 liter or room air. Patient will continue to benefit from skilled PT to improve functional independence, strength and endurance.        Assistance Recommended at Discharge Frequent or constant Supervision/Assistance  If plan is discharge home, recommend the following:  Can travel by private vehicle    A lot of help with walking and/or transfers;A lot of help with bathing/dressing/bathroom;Assistance with cooking/housework;Assistance with feeding;Help with stairs or ramp for entrance;Assist for transportation   No  Equipment Recommendations  None recommended by PT (TBD next venue)    Recommendations for Other Services       Precautions / Restrictions Precautions Precautions: Fall Restrictions Weight Bearing Restrictions: No     Mobility  Bed Mobility Overal bed mobility: Needs Assistance Bed Mobility: Supine to Sit, Sit to Supine     Supine to sit: Modified independent (Device/Increase time), HOB elevated Sit to supine: Min assist   General bed mobility comments: Patient performed supine to sit wwith mod I but required  a little assist to bring legs back into bed due to fatigue    Transfers Overall transfer level: Needs assistance Equipment used: Rolling walker (2 wheels) Transfers: Sit to/from Stand Sit to Stand: Min assist                Ambulation/Gait Ambulation/Gait assistance: Min Chemical engineer (Feet): 5 Feet Assistive device: Rolling walker (2 wheels) Gait Pattern/deviations: Step-to pattern, Decreased step length - right, Decreased step length - left Gait velocity: decr     General Gait Details: Patient able to side step up to head of bed ~5 feet taking small steps with RW and min A. No LOB, but patient very fatigued and needing to sit back down after that.   Stairs             Wheelchair Mobility     Tilt Bed    Modified Rankin (Stroke Patients Only)       Balance Overall balance assessment: Needs assistance Sitting-balance support: Feet supported Sitting balance-Leahy Scale: Good     Standing balance support: Bilateral upper extremity supported, During functional activity, Reliant on assistive device for balance Standing balance-Leahy Scale: Fair Standing balance comment: reliant on the walker, quick to fatigue in standing but did not have any overt LOBs while having UE support                            Cognition Arousal/Alertness: Awake/alert Behavior During Therapy: Flat affect Overall Cognitive Status: Within Functional Limits for tasks assessed  Following Commands: Follows one step commands with increased time     Problem Solving: Slow processing, Requires verbal cues, Requires tactile cues          Exercises      General Comments General comments (skin integrity, edema, etc.): NT in room to take vitals at end of session, RN in room after vitals taken; continues to endorse fatigue      Pertinent Vitals/Pain Pain Assessment Pain Assessment: Faces Faces Pain Scale: Hurts a little bit Pain  Location: L hip Pain Descriptors / Indicators: Discomfort Pain Intervention(s): Monitored during session, Repositioned    Home Living                          Prior Function            PT Goals (current goals can now be found in the care plan section) Acute Rehab PT Goals Patient Stated Goal: get stronger PT Goal Formulation: With patient/family Time For Goal Achievement: 08/01/22 Potential to Achieve Goals: Fair Progress towards PT goals: Progressing toward goals    Frequency    Min 2X/week      PT Plan Current plan remains appropriate    Co-evaluation              AM-PAC PT "6 Clicks" Mobility   Outcome Measure  Help needed turning from your back to your side while in a flat bed without using bedrails?: A Little Help needed moving from lying on your back to sitting on the side of a flat bed without using bedrails?: A Little Help needed moving to and from a bed to a chair (including a wheelchair)?: A Lot Help needed standing up from a chair using your arms (e.g., wheelchair or bedside chair)?: A Little Help needed to walk in hospital room?: A Lot Help needed climbing 3-5 steps with a railing? : Total 6 Click Score: 14    End of Session Equipment Utilized During Treatment: Gait belt Activity Tolerance: Patient limited by fatigue Patient left: in bed;with call bell/phone within reach;with bed alarm set;with family/visitor present Nurse Communication: Mobility status PT Visit Diagnosis: Muscle weakness (generalized) (M62.81);Difficulty in walking, not elsewhere classified (R26.2)     Time: 0865-7846 PT Time Calculation (min) (ACUTE ONLY): 18 min  Charges:    $Therapeutic Activity: 8-22 mins PT General Charges $$ ACUTE PT VISIT: 1 Visit                     Lenon Kuennen, PT, GCS 07/20/22,3:58 PM

## 2022-07-20 NOTE — Progress Notes (Signed)
   07/20/22 1121  Assess: MEWS Score  Temp 100.1 F (37.8 C)  BP 138/62  MAP (mmHg) 85  Pulse Rate (!) 105  Resp (!) 22  SpO2 95 %  O2 Device Nasal Cannula  O2 Flow Rate (L/min) 2 L/min  Assess: MEWS Score  MEWS Temp 0  MEWS Systolic 0  MEWS Pulse 1  MEWS RR 1  MEWS LOC 0  MEWS Score 2  MEWS Score Color Yellow  Assess: if the MEWS score is Yellow or Red  Were vital signs taken at a resting state? No  Focused Assessment No change from prior assessment  Does the patient meet 2 or more of the SIRS criteria? Yes  Does the patient have a confirmed or suspected source of infection? No  MEWS guidelines implemented  No, other (Comment) (vitals not taken at rest we will reaccess at rest, Tylenol was given for mild pain)  Notify: Charge Nurse/RN  Name of Charge Nurse/RN Notified Grier Mitts  Provider Notification  Provider Name/Title Dr Myriam Forehand  Date Provider Notified 07/20/22  Time Provider Notified 1136  Method of Notification  (text)  Notification Reason Change in status  Provider response No new orders  Assess: SIRS CRITERIA  SIRS Temperature  0  SIRS Pulse 1  SIRS Respirations  1  SIRS WBC 0  SIRS Score Sum  2

## 2022-07-21 DIAGNOSIS — R4182 Altered mental status, unspecified: Secondary | ICD-10-CM

## 2022-07-21 DIAGNOSIS — E440 Moderate protein-calorie malnutrition: Secondary | ICD-10-CM

## 2022-07-21 DIAGNOSIS — N390 Urinary tract infection, site not specified: Secondary | ICD-10-CM | POA: Diagnosis not present

## 2022-07-21 DIAGNOSIS — N179 Acute kidney failure, unspecified: Secondary | ICD-10-CM | POA: Diagnosis not present

## 2022-07-21 LAB — BASIC METABOLIC PANEL
Anion gap: 11 (ref 5–15)
BUN: 12 mg/dL (ref 8–23)
CO2: 26 mmol/L (ref 22–32)
Calcium: 8.5 mg/dL — ABNORMAL LOW (ref 8.9–10.3)
Chloride: 103 mmol/L (ref 98–111)
Creatinine, Ser: 1.56 mg/dL — ABNORMAL HIGH (ref 0.44–1.00)
GFR, Estimated: 35 mL/min — ABNORMAL LOW (ref >60)
Glucose, Bld: 107 mg/dL — ABNORMAL HIGH (ref 70–99)
Potassium: 3.4 mmol/L — ABNORMAL LOW (ref 3.5–5.1)
Sodium: 140 mmol/L (ref 135–145)

## 2022-07-21 LAB — CULTURE, BLOOD (ROUTINE X 2): Culture: NO GROWTH

## 2022-07-21 LAB — PHOSPHORUS: Phosphorus: 1.9 mg/dL — ABNORMAL LOW (ref 2.5–4.6)

## 2022-07-21 LAB — URINE CULTURE: Culture: >=100000 — AB

## 2022-07-21 LAB — MAGNESIUM: Magnesium: 1.7 mg/dL (ref 1.7–2.4)

## 2022-07-21 MED ORDER — CEPHALEXIN 500 MG PO CAPS
500.0000 mg | ORAL_CAPSULE | Freq: Two times a day (BID) | ORAL | Status: DC
  Administered 2022-07-22 – 2022-07-23 (×2): 500 mg via ORAL
  Filled 2022-07-21 (×2): qty 1

## 2022-07-21 MED ORDER — K PHOS MONO-SOD PHOS DI & MONO 155-852-130 MG PO TABS
500.0000 mg | ORAL_TABLET | ORAL | Status: AC
  Administered 2022-07-21 (×2): 500 mg via ORAL
  Filled 2022-07-21 (×2): qty 2

## 2022-07-21 NOTE — TOC Progression Note (Signed)
Transition of Care [facility] [place] [facility]) - Progression Note    Patient Details  Name: Alicia [name] MRN: [id number] Date of Birth: 1950-04-26  [facility] CM/SW Contact  Kemper Durie, RN Phone Number: 07/21/2022, 10:38 AM  Clinical Narrative:     Spoke with son [name], presented bed offers ([facility] and [facility]).  Son chose [facility] as SNF choice.  Will notify [facility] and will continue to plan for discharge when patient is medically ready.        Expected Discharge Plan and Services                                               Social Determinants of Health (SDOH) Interventions SDOH Screenings   Food Insecurity: No Food Insecurity (07/18/2022)  Housing: Low Risk  (07/18/2022)  Transportation Needs: No Transportation Needs (07/18/2022)  Utilities: Not At Risk (07/18/2022)  Alcohol Screen: Low Risk  (03/09/2022)  Depression (PHQ2-9): Low Risk  (03/09/2022)  Financial Resource Strain: High Risk (03/09/2022)  Physical Activity: Insufficiently Active (03/09/2022)  Social Connections: Socially Isolated (03/09/2022)  Stress: No Stress Concern Present (03/09/2022)  Tobacco Use: Medium Risk (07/18/2022)    Readmission Risk Interventions     No data to display

## 2022-07-21 NOTE — Progress Notes (Addendum)
[facility]  - [place] [facility]   PATIENT NAME: Alicia [name]    MR#:  [id number]  DATE OF BIRTH:  03/26/1950  SUBJECTIVE:  daughter-in-law at bedside. Patient complains of generalized weakness and poor appetite. Mildly tachycardic this morning. Denies other complaints other than generalized weakness. Patient did work with physical therapy yesterday    VITALS:  Blood pressure 130/82, pulse (!) 107, temperature 98.5 F (36.9 C), temperature source Oral, resp. rate 20, height 5\' 6"  (1.676 m), weight 92.6 kg, SpO2 95 %.  PHYSICAL EXAMINATION:   GENERAL:  72 y.o.-year-old patient with no acute distress. Weak deconditioned LUNGS: Normal breath sounds bilaterally, no wheezing CARDIOVASCULAR: S1, S2 normal. No murmur mild tachycardia ABDOMEN: Soft, nontender, nondistended. Bowel sounds present.  EXTREMITIES: No  edema b/l.    NEUROLOGIC: nonfocal  patient is alert and awake   LABORATORY PANEL:  CBC Recent Labs  Lab 07/19/22 0657  WBC 5.3  HGB 10.9*  HCT 32.5*  PLT 179    Chemistries  Recent Labs  Lab 07/18/22 1652 07/18/22 1859 07/21/22 0630  NA 127*   < > 140  K 2.2*   < > 3.4*  CL 85*   < > 103  CO2 24   < > 26  GLUCOSE 147*   < > 107*  BUN 18   < > 12  CREATININE 2.12*   < > 1.56*  CALCIUM 8.6*   < > 8.5*  MG  --    < > 1.7  AST 85*  --   --   ALT 44  --   --   ALKPHOS 118  --   --   BILITOT 1.3*  --   --    < > = values in this interval not displayed.   Assessment and Plan  Alicia [name] is a 72 y.o. 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  Family said he had not taken any of her medicines for about a week prior to admission.   Acute metabolic encephalopathy: Improved.    Acute UTI: Continue IV  ceftriaxone.  Urine culture is growing Klebsiella oxytoca.  Follow-up sensitivity report.  No growth on blood cultures.   AKI: Improving.  Continue IV fluids and monitor BMP.    Hypokalemia: Slowly improving.  Continue potassium repletion (by IV and PO route) and monitor levels.     Hypophosphatemia: Improved    Prolonged QTc at 582.  Repeat EKG when potassium is replete.    Elevated troponin: This is likely due to demand ischemia.    Chronic diarrhea: Imodium as needed    Stage IV breast cancer with metastasis to the bone.  Outpatient follow-up with [name], oncologist. Per [name], okay to suspend chemotherapy so she can go to SNF for rehab (discussed via secure chat).    Stage II coccygeal decubitus ulcer (present on admission): Continue local wound care.  Nutrition Status: Nutrition Problem: Moderate Malnutrition Etiology: chronic illness Signs/Symptoms: mild fat depletion, mild muscle depletion, percent weight loss Percent weight loss: 11 %     Procedures: Family communication :DIL at bedside Consults :none CODE STATUS: full DVT Prophylaxis : Lovenox Level of care: Telemetry Medical Status is: Inpatient Remains inpatient appropriate because: getting IV antibiotics and awaiting insurance authorization for rehab    TOTAL  TIME TAKING CARE OF THIS PATIENT: 35 minutes.  >50% time spent on counselling and coordination of care  Note: This dictation was prepared with Dragon dictation along with smaller phrase technology. Any transcriptional errors that result from this process are unintentional.  Enedina Finner M.D    [facility]   CC: Primary care physician; Corky Downs, MD

## 2022-07-22 DIAGNOSIS — N179 Acute kidney failure, unspecified: Secondary | ICD-10-CM | POA: Diagnosis not present

## 2022-07-22 DIAGNOSIS — R4182 Altered mental status, unspecified: Secondary | ICD-10-CM | POA: Diagnosis not present

## 2022-07-22 DIAGNOSIS — N390 Urinary tract infection, site not specified: Secondary | ICD-10-CM | POA: Diagnosis not present

## 2022-07-22 LAB — RENAL FUNCTION PANEL
Albumin: 2.7 g/dL — ABNORMAL LOW (ref 3.5–5.0)
Anion gap: 12 (ref 5–15)
BUN: 15 mg/dL (ref 8–23)
CO2: 25 mmol/L (ref 22–32)
Calcium: 8 mg/dL — ABNORMAL LOW (ref 8.9–10.3)
Chloride: 100 mmol/L (ref 98–111)
Creatinine, Ser: 1.53 mg/dL — ABNORMAL HIGH (ref 0.44–1.00)
GFR, Estimated: 36 mL/min — ABNORMAL LOW (ref >60)
Glucose, Bld: 111 mg/dL — ABNORMAL HIGH (ref 70–99)
Phosphorus: 2.6 mg/dL (ref 2.5–4.6)
Potassium: 3.1 mmol/L — ABNORMAL LOW (ref 3.5–5.1)
Sodium: 137 mmol/L (ref 135–145)

## 2022-07-22 LAB — CULTURE, BLOOD (ROUTINE X 2): Culture: NO GROWTH

## 2022-07-22 MED ORDER — MAGNESIUM SULFATE 2 GM/50ML IV SOLN
2.0000 g | Freq: Once | INTRAVENOUS | Status: AC
  Administered 2022-07-22: 2 g via INTRAVENOUS
  Filled 2022-07-22: qty 50

## 2022-07-22 MED ORDER — METOPROLOL TARTRATE 25 MG PO TABS
12.5000 mg | ORAL_TABLET | Freq: Two times a day (BID) | ORAL | Status: DC
  Administered 2022-07-22 – 2022-08-01 (×21): 12.5 mg via ORAL
  Filled 2022-07-22 (×21): qty 1

## 2022-07-22 MED ORDER — ALPRAZOLAM 0.25 MG PO TABS
0.2500 mg | ORAL_TABLET | Freq: Two times a day (BID) | ORAL | Status: DC | PRN
  Administered 2022-07-22 – 2022-07-30 (×5): 0.25 mg via ORAL
  Filled 2022-07-22 (×5): qty 1

## 2022-07-22 MED ORDER — POTASSIUM CHLORIDE CRYS ER 20 MEQ PO TBCR
40.0000 meq | EXTENDED_RELEASE_TABLET | Freq: Once | ORAL | Status: AC
  Administered 2022-07-22: 40 meq via ORAL
  Filled 2022-07-22: qty 2

## 2022-07-22 NOTE — Plan of Care (Signed)

## 2022-07-22 NOTE — Progress Notes (Signed)
[facility]  - [place] [facility]   PATIENT NAME: Alicia [name]    MR#:  [id number]  DATE OF BIRTH:  [birth year]  SUBJECTIVE:  No family today at bedside. Patient complains of generalized weakness and poor appetite.  Very sleepy. Per RN son request xanax be held--I changed to prn  VITALS:  Blood pressure 126/72, pulse (!) 106, temperature 98.9 F (37.2 C), resp. rate 16, height 5\' 6"  (1.676 m), weight 90.3 kg, SpO2 92 %.  PHYSICAL EXAMINATION:   GENERAL:  72 y.o.-year-old patient with no acute distress. Weak deconditioned LUNGS: Normal breath sounds bilaterally, no wheezing CARDIOVASCULAR: S1, S2 normal. No murmur mild tachycardia ABDOMEN: Soft, nontender, nondistended. Bowel sounds present.  EXTREMITIES: No  edema b/l.    NEUROLOGIC: nonfocal  patient is sleepy   LABORATORY PANEL:  CBC Recent Labs  Lab 07/19/22 0657  WBC 5.3  HGB 10.9*  HCT 32.5*  PLT 179     Chemistries  Recent Labs  Lab 07/18/22 1652 07/18/22 1859 07/21/22 0630 07/22/22 0841  NA 127*   < > 140 137  K 2.2*   < > 3.4* 3.1*  CL 85*   < > 103 100  CO2 24   < > 26 25  GLUCOSE 147*   < > 107* 111*  BUN 18   < > 12 15  CREATININE 2.12*   < > 1.56* 1.53*  CALCIUM 8.6*   < > 8.5* 8.0*  MG  --    < > 1.7  --   AST 85*  --   --   --   ALT 44  --   --   --   ALKPHOS 118  --   --   --   BILITOT 1.3*  --   --   --    < > = values in this interval not displayed.    Assessment and Plan  Alicia [name] is a 72 y.o. 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  Family said he had not taken any of her medicines for about a week prior to admission.   Acute metabolic encephalopathy: Improving   Acute UTI: Continue IV ceftriaxone.  Urine culture is growing  Klebsiella oxytoca.  Follow-up sensitivity report.  No growth on blood cultures.   AKI: Improving.  Received  IV fluids and creat improving    Hypokalemia: Slowly improving.  Continue potassium repletion (by IV and PO route) and monitor levels.     Hypophosphatemia: Improved    Prolonged QTc at 582.  Repeat EKG when potassium is replete.    Elevated troponin: This is likely due to demand ischemia.    Chronic diarrhea: Imodium as needed    Stage IV breast cancer with metastasis to the bone.  Outpatient follow-up with [name], oncologist. Per [name], okay to suspend chemotherapy so she can go to SNF for rehab (discussed via secure chat).    Stage II coccygeal decubitus ulcer (present on admission): Continue local wound care.  Nutrition Status: Nutrition Problem: Moderate Malnutrition Etiology: chronic illness Signs/Symptoms: mild fat depletion, mild muscle depletion, percent weight loss Percent weight loss: 11 %     Procedures: Family communication :none today Consults :none CODE STATUS: full DVT Prophylaxis : Lovenox Level of care: Telemetry Medical Status is: Inpatient Remains  inpatient appropriate because: awaiting insurance authorization for rehab    TOTAL TIME TAKING CARE OF THIS PATIENT: 35 minutes.  >50% time spent on counselling and coordination of care  Note: This dictation was prepared with Dragon dictation along with smaller phrase technology. Any transcriptional errors that result from this process are unintentional.  Enedina Finner M.D    [facility]   CC: Primary care physician; Corky Downs, MD

## 2022-07-23 ENCOUNTER — Inpatient Hospital Stay: Payer: Medicare Other

## 2022-07-23 DIAGNOSIS — A419 Sepsis, unspecified organism: Secondary | ICD-10-CM

## 2022-07-23 DIAGNOSIS — I4581 Long QT syndrome: Secondary | ICD-10-CM

## 2022-07-23 DIAGNOSIS — N390 Urinary tract infection, site not specified: Secondary | ICD-10-CM | POA: Diagnosis not present

## 2022-07-23 DIAGNOSIS — N179 Acute kidney failure, unspecified: Secondary | ICD-10-CM | POA: Diagnosis not present

## 2022-07-23 LAB — COMPREHENSIVE METABOLIC PANEL
ALT: 121 U/L — ABNORMAL HIGH (ref 0–44)
AST: 295 U/L — ABNORMAL HIGH (ref 15–41)
Albumin: 2.3 g/dL — ABNORMAL LOW (ref 3.5–5.0)
Alkaline Phosphatase: 117 U/L (ref 38–126)
Anion gap: 8 (ref 5–15)
BUN: 16 mg/dL (ref 8–23)
CO2: 24 mmol/L (ref 22–32)
Calcium: 8.3 mg/dL — ABNORMAL LOW (ref 8.9–10.3)
Chloride: 103 mmol/L (ref 98–111)
Creatinine, Ser: 1.52 mg/dL — ABNORMAL HIGH (ref 0.44–1.00)
GFR, Estimated: 36 mL/min — ABNORMAL LOW (ref >60)
Glucose, Bld: 115 mg/dL — ABNORMAL HIGH (ref 70–99)
Potassium: 3.7 mmol/L (ref 3.5–5.1)
Sodium: 135 mmol/L (ref 135–145)
Total Bilirubin: 0.4 mg/dL (ref 0.3–1.2)
Total Protein: 5.4 g/dL — ABNORMAL LOW (ref 6.5–8.1)

## 2022-07-23 LAB — CBC WITH DIFFERENTIAL/PLATELET
Abs Immature Granulocytes: 0.15 10*3/uL — ABNORMAL HIGH (ref 0.00–0.07)
Basophils Absolute: 0 10*3/uL (ref 0.0–0.1)
Basophils Relative: 1 %
Eosinophils Absolute: 0.1 10*3/uL (ref 0.0–0.5)
Eosinophils Relative: 4 %
HCT: 26.4 % — ABNORMAL LOW (ref 36.0–46.0)
Hemoglobin: 8.5 g/dL — ABNORMAL LOW (ref 12.0–15.0)
Immature Granulocytes: 4 %
Lymphocytes Relative: 4 %
Lymphs Abs: 0.2 10*3/uL — ABNORMAL LOW (ref 0.7–4.0)
MCH: 26.6 pg (ref 26.0–34.0)
MCHC: 32.2 g/dL (ref 30.0–36.0)
MCV: 82.5 fL (ref 80.0–100.0)
Monocytes Absolute: 0.4 10*3/uL (ref 0.1–1.0)
Monocytes Relative: 10 %
Neutro Abs: 3 10*3/uL (ref 1.7–7.7)
Neutrophils Relative %: 77 %
Platelets: 146 10*3/uL — ABNORMAL LOW (ref 150–400)
RBC: 3.2 MIL/uL — ABNORMAL LOW (ref 3.87–5.11)
RDW: 16.8 % — ABNORMAL HIGH (ref 11.5–15.5)
WBC: 3.8 10*3/uL — ABNORMAL LOW (ref 4.0–10.5)
nRBC: 0 % (ref 0.0–0.2)

## 2022-07-23 LAB — PROTIME-INR
INR: 1.8 — ABNORMAL HIGH (ref 0.8–1.2)
Prothrombin Time: 20.8 seconds — ABNORMAL HIGH (ref 11.4–15.2)

## 2022-07-23 LAB — BASIC METABOLIC PANEL
Anion gap: 8 (ref 5–15)
BUN: 14 mg/dL (ref 8–23)
CO2: 25 mmol/L (ref 22–32)
Calcium: 8.3 mg/dL — ABNORMAL LOW (ref 8.9–10.3)
Chloride: 104 mmol/L (ref 98–111)
Creatinine, Ser: 1.54 mg/dL — ABNORMAL HIGH (ref 0.44–1.00)
GFR, Estimated: 35 mL/min — ABNORMAL LOW (ref >60)
Glucose, Bld: 104 mg/dL — ABNORMAL HIGH (ref 70–99)
Potassium: 3.4 mmol/L — ABNORMAL LOW (ref 3.5–5.1)
Sodium: 137 mmol/L (ref 135–145)

## 2022-07-23 LAB — PROCALCITONIN: Procalcitonin: 0.54 ng/mL

## 2022-07-23 LAB — CULTURE, BLOOD (ROUTINE X 2): Special Requests: ADEQUATE

## 2022-07-23 LAB — MAGNESIUM: Magnesium: 2 mg/dL (ref 1.7–2.4)

## 2022-07-23 LAB — APTT: aPTT: 49 seconds — ABNORMAL HIGH (ref 24–36)

## 2022-07-23 LAB — D-DIMER, QUANTITATIVE: D-Dimer, Quant: 1.79 ug/mL-FEU — ABNORMAL HIGH (ref 0.00–0.50)

## 2022-07-23 LAB — LACTIC ACID, PLASMA: Lactic Acid, Venous: 1.5 mmol/L (ref 0.5–1.9)

## 2022-07-23 MED ORDER — CHLORHEXIDINE GLUCONATE CLOTH 2 % EX PADS: 6.0000 | MEDICATED_PAD | Freq: Every day | CUTANEOUS | Status: DC

## 2022-07-23 MED ORDER — LACTATED RINGERS IV BOLUS
1000.0000 mL | Freq: Once | INTRAVENOUS | Status: AC
  Administered 2022-07-23: 1000 mL via INTRAVENOUS

## 2022-07-23 MED ORDER — SODIUM CHLORIDE 0.9 % IV SOLN
2.0000 g | Freq: Two times a day (BID) | INTRAVENOUS | Status: DC
  Administered 2022-07-24 – 2022-07-27 (×7): 2 g via INTRAVENOUS
  Filled 2022-07-23 (×7): qty 12.5

## 2022-07-23 MED ORDER — VANCOMYCIN HCL 2000 MG/400ML IV SOLN
2000.0000 mg | Freq: Once | INTRAVENOUS | Status: AC
  Administered 2022-07-24: 2000 mg via INTRAVENOUS
  Filled 2022-07-23: qty 400

## 2022-07-23 MED ORDER — LACTATED RINGERS IV SOLN: INTRAVENOUS | Status: DC

## 2022-07-23 MED ORDER — VANCOMYCIN HCL IN DEXTROSE 1-5 GM/200ML-% IV SOLN
1000.0000 mg | INTRAVENOUS | Status: DC
  Administered 2022-07-25: 1000 mg via INTRAVENOUS
  Filled 2022-07-23: qty 200

## 2022-07-23 MED ORDER — CEFDINIR 300 MG PO CAPS
300.0000 mg | ORAL_CAPSULE | Freq: Two times a day (BID) | ORAL | Status: DC
  Administered 2022-07-23: 300 mg via ORAL
  Filled 2022-07-23: qty 1

## 2022-07-23 MED ORDER — METRONIDAZOLE 500 MG/100ML IV SOLN
500.0000 mg | Freq: Two times a day (BID) | INTRAVENOUS | Status: DC
  Administered 2022-07-24: 500 mg via INTRAVENOUS
  Filled 2022-07-23 (×2): qty 100

## 2022-07-23 MED ORDER — LACTATED RINGERS IV BOLUS (SEPSIS)
1000.0000 mL | Freq: Once | INTRAVENOUS | Status: AC
  Administered 2022-07-23: 1000 mL via INTRAVENOUS

## 2022-07-23 NOTE — Progress Notes (Signed)
Occupational Therapy Treatment Patient Details Name: Alicia [name] MRN: [id number] DOB: [birth year] Today's Date: 07/23/2022   History of present illness KEYONA PRUDHOMME is a 72 y.o. 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  Family said she had not taken any of her medicines for about a week prior to admission.   OT comments  Pt seen for OT treatment on this date. Upon arrival to room pt resting in bed, agreeable to tx. Pt requires MIN A for bed mobility to assist LLE. MIN A for ambulation in the room with RW. Pt tolerated standing for 5+ minutes while performing oral hygiene at the sink. Pt required verbal cues to stay within RW base, which was complicated by forward lean. Pt unable to stand without UE support 2/2 to fatigue and feeling unbalanced. Pt reported contentment over session and improved activity tolerance this morning. Pt making good progress toward goals, will continue to follow POC. Discharge recommendation remains appropriate.     Recommendations for follow up therapy are one component of a multi-disciplinary discharge planning process, led by the attending physician.  Recommendations may be updated based on patient status, additional functional criteria and insurance authorization.    Assistance Recommended at Discharge Frequent or constant Supervision/Assistance  Patient can return home with the following  A lot of help with walking and/or transfers;A lot of help with bathing/dressing/bathroom;Assistance with cooking/housework;Direct supervision/assist for medications management;Assist for transportation;Help with stairs or ramp for entrance   Equipment Recommendations  BSC/3in1    Recommendations for Other Services      Precautions / Restrictions  Precautions Precautions: Fall Restrictions Weight Bearing Restrictions: No       Mobility Bed Mobility Overal bed mobility: Needs Assistance Bed Mobility: Supine to Sit, Sit to Supine     Supine to sit: Min assist Sit to supine: Min assist   General bed mobility comments: Patient performed bed mobility with MIN A  for LE management    Transfers Overall transfer level: Needs assistance Equipment used: Rolling walker (2 wheels) Transfers: Sit to/from Stand Sit to Stand: Min assist           General transfer comment: cueing for safe use of walker     Balance Overall balance assessment: Needs assistance Sitting-balance support: Feet supported Sitting balance-Leahy Scale: Good     Standing balance support: Single extremity supported, During functional activity, Reliant on assistive device for balance Standing balance-Leahy Scale: Fair                             ADL either performed or assessed with clinical judgement   ADL Overall ADL's : Needs assistance/impaired     Grooming: Oral care;Minimal assistance;Standing Grooming Details (indicate cue type and reason): MIN A as pt held onto counter with one hand throughout standing at sink due to fatigue                             Functional mobility during ADLs: Minimal assistance;Rolling walker (2 wheels) General ADL Comments: Assistance needed to complete ADLs due to decreased activity tolerance and onset of fatigue/dizziness. Pt reports feeling weak throughout session.    Extremity/Trunk Assessment Upper Extremity Assessment Upper Extremity Assessment: Generalized weakness   Lower  Extremity Assessment Lower Extremity Assessment: Generalized weakness        Vision       Perception     Praxis      Cognition Arousal/Alertness: Awake/alert Behavior During Therapy: Flat affect Overall Cognitive Status: Within Functional Limits for tasks assessed                                Problem Solving: Slow processing, Requires verbal cues, Requires tactile cues          Exercises      Shoulder Instructions       General Comments HR and SpO2 appropriate on room air    Pertinent Vitals/ Pain       Pain Assessment Pain Assessment: Faces Faces Pain Scale: Hurts a little bit Pain Location: L hip Pain Descriptors / Indicators: Discomfort Pain Intervention(s): Monitored during session, Repositioned  Home Living                                          Prior Functioning/Environment              Frequency  Min 1X/week        Progress Toward Goals  OT Goals(current goals can now be found in the care plan section)  Progress towards OT goals: Progressing toward goals  Acute Rehab OT Goals Patient Stated Goal: To increase energy OT Goal Formulation: With patient Time For Goal Achievement: 08/02/22 Potential to Achieve Goals: Fair ADL Goals Pt Will Perform Grooming: standing;with modified independence Pt Will Transfer to Toilet: with modified independence;bedside commode Pt Will Perform Toileting - Clothing Manipulation and hygiene: sit to/from stand;with min guard assist  Plan Discharge plan remains appropriate    Co-evaluation                 AM-PAC OT "6 Clicks" Daily Activity     Outcome Measure   Help from another person eating meals?: A Little Help from another person taking care of personal grooming?: A Little Help from another person toileting, which includes using toliet, bedpan, or urinal?: A Lot Help from another person bathing (including washing, rinsing, drying)?: A Lot Help from another person to put on and taking off regular upper body clothing?: A Lot Help from another person to put on and taking off regular lower body clothing?: A Lot 6 Click Score: 14    End of Session Equipment Utilized During Treatment: Gait belt;Rolling walker (2 wheels)  OT Visit Diagnosis: Unsteadiness on feet  (R26.81);Muscle weakness (generalized) (M62.81)   Activity Tolerance Patient limited by fatigue   Patient Left in bed;with call bell/phone within reach;with chair alarm set   Nurse Communication          Time: [phone] OT Time Calculation (min): 24 min  Charges: OT General Charges $OT Visit: 1 Visit OT Treatments $Self Care/Home Management : 23-37 mins [address], OTS   Zaakirah Kistner 07/23/2022, 12:04 PM

## 2022-07-23 NOTE — Progress Notes (Signed)
   07/23/22 1908  Assess: MEWS Score  Temp (!) 102 F (38.9 C)  BP (!) 151/67  Pulse Rate (!) 114  Resp 17  Level of Consciousness Alert  SpO2 91 %  O2 Device Room Air  Assess: MEWS Score  MEWS Temp 2  MEWS Systolic 0  MEWS Pulse 2  MEWS RR 0  MEWS LOC 0  MEWS Score 4  MEWS Score Color Red  Assess: if the MEWS score is Yellow or Red  Were vital signs taken at a resting state? Yes  Focused Assessment Change from prior assessment (see assessment flowsheet)  Does the patient meet 2 or more of the SIRS criteria? Yes  Does the patient have a confirmed or suspected source of infection? No  MEWS guidelines implemented  Yes, red  Treat  MEWS Interventions Considered administering scheduled or prn medications/treatments as ordered  Take Vital Signs  Increase Vital Sign Frequency  Red: Q1hr x2, continue Q4hrs until patient remains green for 12hrs  Escalate  MEWS: Escalate Red: Discuss with charge nurse and notify provider. Consider notifying RRT. If remains red for 2 hours consider need for higher level of care  Notify: Charge Nurse/RN  Name of Charge Nurse/RN Notified [facility]  Provider Notification  Provider Name/Title Dr Lindajo Royal  Date Provider Notified 07/23/22  Time Provider Notified 1928  Method of Notification  (text)  Notification Reason Change in status  Provider response Other (Comment) (awaiting response tylenol was given pper MAR)  Assess: SIRS CRITERIA  SIRS Temperature  1  SIRS Pulse 1  SIRS Respirations  0  SIRS WBC 0  SIRS Score Sum  2

## 2022-07-23 NOTE — Progress Notes (Addendum)
Progress Note    Alicia [name]  ZOX:[id number] DOB: April 23, 1950  DOA: 07/18/2022 PCP: Corky Downs, MD      Brief Narrative:    Medical records reviewed and are as summarized below:  Alicia [name] is a 72 y.o. 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  Family said he had not taken any of her medicines for about a week prior to admission.      Assessment/Plan:   Principal Problem:   Altered mental status Active Problems:   AKI (acute kidney injury) (HCC)   Electrolyte abnormality   Generalized weakness   QT prolongation   Elevated troponin   Diarrhea   Malignant neoplasm metastatic to bone (HCC)   Primary hypertension   Hypokalemia   Hypophosphatemia   Acute metabolic encephalopathy   Malnutrition of moderate degree   Acute UTI   Pressure injury of skin   Body mass index is 32.13 kg/m.  (Obesity)   Acute metabolic encephalopathy: Improved.    Acute UTI: Urine culture showed Klebsiella oxytoca.  Follow-up sensitivity report.  Change cephalexin to cefdinir with plan to complete antibiotics on 08/04/2022 .  AKI on CKD stage IIIb: Creatinine is stable.   Hypokalemia: Improved.  Continue potassium repletion.   Hypophosphatemia: Improved   Prolonged QTc at 582 on 07/18/2022.  Repeat EKG on 07/23/2022 showed improvement in QTc to 458   Elevated troponin: This is likely due to demand ischemia.   Chronic diarrhea: Imodium as needed   Stage IV breast cancer with metastasis to the bone.  Outpatient follow-up with [name], oncologist. Per [name], okay to suspend chemotherapy so she can go to SNF for rehab (discussed via secure chat).   Stage II coccygeal decubitus ulcer (present on admission): Continue local wound  care.    Diet Order             Diet regular Room service appropriate? Yes; Fluid consistency: Thin  Diet effective now                            Consultants: None  Procedures: None    Medications:    aspirin EC  81 mg Oral QHS   cefdinir  300 mg Oral Q12H   Chlorhexidine Gluconate Cloth  6 each Topical Daily   enoxaparin (LOVENOX) injection  40 mg Subcutaneous Q24H   exemestane  25 mg Oral QPC breakfast   feeding supplement  237 mL Oral TID BM   folic acid  1 mg Oral Daily   levothyroxine  100 mcg Oral Q0600   metoprolol tartrate  12.5 mg Oral BID   multivitamin with minerals  1 tablet Oral Daily   pantoprazole  20 mg Oral Daily   potassium chloride  40 mEq Oral Daily   rosuvastatin  5 mg Oral Daily   sodium chloride flush  3 mL Intravenous Q12H   thiamine  100 mg Oral Daily   traZODone  50 mg Oral QHS   Continuous Infusions:     Anti-infectives (From admission, onward)    Start     Dose/Rate Route Frequency Ordered Stop   07/23/22 2200  cefdinir (OMNICEF) capsule 300 mg        300 mg Oral Every 12 hours 07/23/22 1220  07/26/22 0959   07/22/22 2200  cephALEXin (KEFLEX) capsule 500 mg  Status:  Discontinued        500 mg Oral Every 12 hours 07/21/22 1409 07/23/22 1220   07/18/22 2200  cefTRIAXone (ROCEPHIN) 1 g in sodium chloride 0.9 % 100 mL IVPB        1 g 200 mL/hr over 30 Minutes Intravenous Every 24 hours 07/18/22 2104 07/21/22 2220              Family Communication/Anticipated D/C date and plan/Code Status   DVT prophylaxis: enoxaparin (LOVENOX) injection 40 mg Start: 07/19/22 2200     Code Status: Full Code  Family Communication: Plan discussed with [name], son, over the phone Disposition Plan: Plan to discharge to SNF   Status is: Inpatient Remains inpatient appropriate because:Awaiting placement to SNF       Subjective:   Interval events noted.  No diarrhea.  Abdominal pain has improved.  She is feeling better  today.   Objective:    Vitals:   07/22/22 2112 07/23/22 0617 07/23/22 0800 07/23/22 1025  BP: 122/61 107/66 128/67   Pulse: (!) 110 99 100   Resp: 20 18 18    Temp: 99.8 F (37.7 C) 99.4 F (37.4 C) (!) 100.4 F (38 C) 98.5 F (36.9 C)  TempSrc: Oral Oral Oral Oral  SpO2: 91% (!) 87% 90%   Weight:      Height:       No data found.   Intake/Output Summary (Last 24 hours) at 07/23/2022 1241 Last data filed at 07/23/2022 1032 Gross per 24 hour  Intake 480 ml  Output --  Net 480 ml   Filed Weights   07/20/22 0701 07/21/22 0500 07/22/22 0108  Weight: 86.9 kg 92.6 kg 90.3 kg    Exam:  GEN: NAD SKIN: Warm and dry EYES: No pallor or icterus ENT: MMM CV: RRR PULM: CTA B ABD: soft, ND, NT, +BS CNS: AAO x 3, non focal EXT: No edema or tenderness       Data Reviewed:   I have personally reviewed following labs and imaging studies:  Labs: Labs show the following:   Basic Metabolic Panel: Recent Labs  Lab 07/18/22 1859 07/19/22 0657 07/20/22 0416 07/21/22 0630 07/22/22 0841 07/23/22 0633  NA  --  131* 136 140 137 137  K  --  2.5* 2.8* 3.4* 3.1* 3.4*  CL  --  92* 101 103 100 104  CO2  --  26 26 26 25 25   GLUCOSE  --  113* 109* 107* 111* 104*  BUN  --  16 13 12 15 14   CREATININE  --  1.80* 1.63* 1.56* 1.53* 1.54*  CALCIUM  --  8.5* 8.0* 8.5* 8.0* 8.3*  MG 1.8 2.1 1.8 1.7  --  2.0  PHOS  --  1.3* 2.8 1.9* 2.6  --    GFR Estimated Creatinine Clearance: 36.8 mL/min (A) (by C-G formula based on SCr of 1.54 mg/dL (H)). Liver Function Tests: Recent Labs  Lab 07/18/22 1652 07/22/22 0841  AST 85*  --   ALT 44  --   ALKPHOS 118  --   BILITOT 1.3*  --   PROT 7.3  --   ALBUMIN 3.6 2.7*   No results for input(s): "LIPASE", "AMYLASE" in the last 168 hours. No results for input(s): "AMMONIA" in the last 168 hours. Coagulation profile Recent Labs  Lab 07/18/22 1708  INR 1.2    CBC: Recent Labs  Lab 07/18/22 1652  07/19/22 0657  WBC 6.4 5.3   NEUTROABS 5.5  --   HGB 11.2* 10.9*  HCT 33.6* 32.5*  MCV 81.4 81.5  PLT 215 179   Cardiac Enzymes: No results for input(s): "CKTOTAL", "CKMB", "CKMBINDEX", "TROPONINI" in the last 168 hours. BNP (last 3 results) No results for input(s): "PROBNP" in the last 8760 hours. CBG: Recent Labs  Lab 07/19/22 0725  GLUCAP 104*   D-Dimer: No results for input(s): "DDIMER" in the last 72 hours. Hgb A1c: No results for input(s): "HGBA1C" in the last 72 hours. Lipid Profile: No results for input(s): "CHOL", "HDL", "LDLCALC", "TRIG", "CHOLHDL", "LDLDIRECT" in the last 72 hours. Thyroid function studies: No results for input(s): "TSH", "T4TOTAL", "T3FREE", "THYROIDAB" in the last 72 hours.  Invalid input(s): "FREET3"  Anemia work up: No results for input(s): "VITAMINB12", "FOLATE", "FERRITIN", "TIBC", "IRON", "RETICCTPCT" in the last 72 hours. Sepsis Labs: Recent Labs  Lab 07/18/22 1652 07/18/22 1859 07/19/22 0657  WBC 6.4  --  5.3  LATICACIDVEN 2.7* 1.5  --     Microbiology Recent Results (from the past 240 hour(s))  Culture, blood (Routine x 2)     Status: None   Collection Time: 07/18/22  4:52 PM   Specimen: BLOOD  Result Value Ref Range Status   Specimen Description BLOOD RIGHT ANTECUBITAL  Final   Special Requests   Final    BOTTLES DRAWN AEROBIC AND ANAEROBIC Blood Culture results may not be optimal due to an excessive volume of blood received in culture bottles   Culture   Final    NO GROWTH 5 DAYS Performed at [facility], [address] [place], [place] [address]    Report Status 07/23/2022 FINAL  Final  Culture, blood (Routine x 2)     Status: None   Collection Time: 07/18/22  5:08 PM   Specimen: BLOOD  Result Value Ref Range Status   Specimen Description BLOOD BLOOD RIGHT ARM  Final   Special Requests   Final    BOTTLES DRAWN AEROBIC AND ANAEROBIC Blood Culture adequate volume   Culture   Final    NO GROWTH 5 DAYS Performed at [facility], [address] [place], [place] [address]    Report Status 07/23/2022 FINAL  Final  Urine Culture (for pregnant, neutropenic or urologic patients or patients with an indwelling urinary catheter)     Status: Abnormal   Collection Time: 07/18/22  8:30 PM   Specimen: Urine, Clean Catch  Result Value Ref Range Status   Specimen Description   Final    URINE, CLEAN CATCH Performed at [facility], [address], [place], [place] [address]    Special Requests   Final    NONE Performed at [facility], [address] [place], [place] [address]    Culture >=100,000 COLONIES/mL KLEBSIELLA OXYTOCA (A)  Final   Report Status 07/21/2022 FINAL  Final   Organism ID, Bacteria KLEBSIELLA OXYTOCA (A)  Final      Susceptibility   Klebsiella oxytoca - MIC*    AMPICILLIN >=32 RESISTANT Resistant     CEFEPIME <=0.12 SENSITIVE Sensitive     CEFTRIAXONE <=0.25 SENSITIVE Sensitive     CIPROFLOXACIN <=0.25 SENSITIVE Sensitive     GENTAMICIN <=1 SENSITIVE Sensitive     IMIPENEM <=0.25 SENSITIVE Sensitive     NITROFURANTOIN <=16 SENSITIVE Sensitive     TRIMETH/SULFA <=20 SENSITIVE Sensitive     AMPICILLIN/SULBACTAM 16 INTERMEDIATE Intermediate     PIP/TAZO <=4 SENSITIVE Sensitive     * >=  100,000 COLONIES/mL KLEBSIELLA OXYTOCA    Procedures and diagnostic studies:  No results found.             LOS: 3 days   Calden Dorsey  [facility]   Pager on [email or web address]. If 7PM-7AM, please contact night-coverage at [email or web address]     07/23/2022, 12:41 PM

## 2022-07-23 NOTE — Care Management Important Message (Signed)
Important Message  Patient Details  Name: Alicia [name] MRN: [id number] Date of Birth: 04-15-1950   Medicare Important Message Given:  N/A - LOS <3 / Initial given by admissions     Olegario Messier A Lydian Chavous 07/23/2022, 10:38 AM

## 2022-07-23 NOTE — Progress Notes (Signed)
Pharmacy Antibiotic Note  Alicia [name] is a 72 y.o. female admitted on 07/18/2022 with sepsis from unknown source.  Pharmacy has been consulted for Cefepime & Vancomycin dosing.  Plan: Cefepime 2 gm q12hr per indication & renal fxn.  Pt given Vancomycin 2000 mg once. Vancomycin 1000 mg IV Q 24 hrs. Goal AUC 400-550. Expected AUC: 512.5 SCr used: 1.52, Vd used: 0.72  Pharmacy will continue to follow and will adjust abx dosing whenever warranted.  Temp (24hrs), Avg:100.2 F (37.9 C), Min:98.4 F (36.9 C), Max:103.1 F (39.5 C)   Recent Labs  Lab 07/18/22 1652 07/18/22 1859 07/19/22 0657 07/20/22 0416 07/21/22 0630 07/22/22 0841 07/23/22 0633 07/23/22 2223 07/23/22 2228  WBC 6.4  --  5.3  --   --   --   --   --  3.8*  CREATININE 2.12*  --  1.80* 1.63* 1.56* 1.53* 1.54*  --  1.52*  LATICACIDVEN 2.7* 1.5  --   --   --   --   --  1.5  --     Estimated Creatinine Clearance: 37.3 mL/min (A) (by C-G formula based on SCr of 1.52 mg/dL (H)).    Allergies  Allergen Reactions   Sulfa Antibiotics Diarrhea    Antimicrobials this admission: 7/08 Cephalexin >> x 2 doses 7/08 Cefdinir >> x 1 dose 7/09 Cefepime >> 7/09 Vancomycin >>  Microbiology results: 7/08 BCx: Pending 7/03 UCx: Klebsiella Oxytoca   Thank you for allowing pharmacy to be a part of this patient's care.  Otelia Sergeant, PharmD, [facility] 07/23/2022 11:25 PM

## 2022-07-23 NOTE — Progress Notes (Signed)
CROSS COVER NOTE  NAME: Alicia [name] MRN: [id number] DOB : 1950/02/16    Concern as stated by nurse / staff   S:  1908-RED MEWS:  151/67, HR 114, RR 17,  temp 102 oral  B:  72 y.o. 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  Family said he had not taken any of her medicines for about a week prior to admission. Blood cultures x 2 drawn 7/3 NGTD.  Urine culture 7/3 showed Klebsiella oxytoca.  Follow-up sensitivity report.  Change cephalexin to cefdinir today with plan to complete antibiotics on 08/04/2022   A:  patient c/o being cold, rigors present.  All blankets except one removed.  PO Tylenol given at 1908.  R:  Notification per protocol.  Any further intervention needed?     Pertinent findings on chart review: Information as above H&P and progress note reviewed: Essentially admitted for weakness and altered mental status and diagnosed with a UTI.  With several other acute findings including AKI secondary to poor oral intake with electrolyte abnormalities.  Workup thus far showing UTI for which she is on treatment with appropriate antibiotics    07/23/2022    7:08 PM 07/23/2022    4:01 PM 07/23/2022    8:00 AM  Vitals with BMI  Systolic 151 118 409  Diastolic 67 57 67  Pulse 114 93 100   Oral temperature 102 BMP done earlier unremarkable Last CBC done was on 7/4   Intervention Sepsis workup ordered Resp viral panel   Post bolus vitals:    07/23/2022   10:08 PM 07/23/2022    9:07 PM 07/23/2022    8:06 PM  Vitals with BMI  Systolic 93 95 112  Diastolic 53 58 57  Pulse 86 93 109   Labs with transaminitis, pancytopenia,  D dimer 1.79, lactic acid 1.4, procalcitonin 0.54     Latest Ref Rng & Units 07/23/2022   10:28 PM 07/19/2022    6:57 AM  07/18/2022    4:52 PM  CBC  WBC 4.0 - 10.5 K/uL 3.8  5.3  6.4   Hemoglobin 12.0 - 15.0 g/dL 8.5  81.1  91.4   Hematocrit 36.0 - 46.0 % 26.4  32.5  33.6   Platelets 150 - 400 K/uL 146  179  215       Latest Ref Rng & Units 07/23/2022   10:28 PM 07/23/2022    6:33 AM 07/22/2022    8:41 AM  CMP  Glucose 70 - 99 mg/dL 782  956  213   BUN 8 - 23 mg/dL 16  14  15    Creatinine 0.44 - 1.00 mg/dL 0.86  5.78  4.69   Sodium 135 - 145 mmol/L 135  137  137   Potassium 3.5 - 5.1 mmol/L 3.7  3.4  3.1   Chloride 98 - 111 mmol/L 103  104  100   CO2 22 - 32 mmol/L 24  25  25    Calcium 8.9 - 10.3 mg/dL 8.3  8.3  8.0   Total Protein 6.5 - 8.1 g/dL 5.4     Total Bilirubin 0.3 - 1.2 mg/dL 0.4     Alkaline Phos 38 - 126 U/L 117     AST 15 - 41 U/L 295  ALT 0 - 44 U/L 121         Interventions  CTA chest to evaluate for PE CT abdomen and pelvis to evaluate for intra-abdominal process Sepsis workup  Workup revealing right middle lobe pneumonia   Assessment and  Interventions   Assessment:  Sepsis secondary to pneumonia, suspect aspiration Pancytopenia, new   Plan: Patient was bolused and started on maintenance per sepsis protocol Broad spectrum abx for unknown source started due to unknown etiology---> narrowed down to cefepime and Vanco for pneumonia Follow respiratory viral panel SLP consult Keep n.p.o. until evaluated by speech    On reassessment    07/24/2022    5:00 AM 07/24/2022    4:57 AM 07/24/2022    1:06 AM  Vitals with BMI  Weight 194 lbs 4 oz    BMI 31.36    Systolic  129 127  Diastolic  62 65  Pulse  104 98       CRITICAL CARE Performed by: Andris Baumann   Total critical care time: 90 minutes  Critical care time was exclusive of separately billable procedures and treating other patients.  Critical care was necessary to treat or prevent imminent or life-threatening deterioration.  Critical care was time spent personally by me on the following activities:  development of treatment plan with patient and/or surrogate as well as nursing, discussions with consultants, evaluation of patient's response to treatment, examination of patient, obtaining history from patient or surrogate, ordering and performing treatments and interventions, ordering and review of laboratory studies, ordering and review of radiographic studies, pulse oximetry and re-evaluation of patient's condition.

## 2022-07-23 NOTE — TOC Progression Note (Signed)
Transition of Care [facility]) - Progression Note    Patient Details  Name: Alicia [name] MRN: [id number] Date of Birth: 09-29-1950  Transition of Care [facility]) CM/SW Contact  Garret Reddish, RN Phone Number: 07/23/2022, 9:47 PM  Clinical Narrative:   Chart reviewed.  I have spoken with Tammy with [facility].  She informs me that she will have a bed at [facility] on tomorrow.    TOC will continue to follow for discharge planning.           Expected Discharge Plan and Services                                               Social Determinants of Health (SDOH) Interventions SDOH Screenings   Food Insecurity: No Food Insecurity (07/18/2022)  Housing: Low Risk  (07/18/2022)  Transportation Needs: No Transportation Needs (07/18/2022)  Utilities: Not At Risk (07/18/2022)  Alcohol Screen: Low Risk  (03/09/2022)  Depression (PHQ2-9): Low Risk  (03/09/2022)  Financial Resource Strain: High Risk (03/09/2022)  Physical Activity: Insufficiently Active (03/09/2022)  Social Connections: Socially Isolated (03/09/2022)  Stress: No Stress Concern Present (03/09/2022)  Tobacco Use: Medium Risk (07/18/2022)    Readmission Risk Interventions     No data to display

## 2022-07-23 NOTE — Progress Notes (Signed)
Physical Therapy Treatment Patient Details Name: Alicia [name] MRN: [id number] DOB: 1950/05/08 Today's Date: 07/23/2022   History of Present Illness Alicia [name] is a 72 y.o. 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  Family said she had not taken any of her medicines for about a week prior to admission.    PT Comments  The patient reports getting out of bed multiple times already today. Patient declined getting up due to fatigue but is agreeable to in bed exercises for strengthening which were performed with assistance and without pain reported. Recommend to continue PT to maximize independence and facilitate return to prior level of function.     Assistance Recommended at Discharge Frequent or constant Supervision/Assistance  If plan is discharge home, recommend the following:  Can travel by private vehicle    A lot of help with walking and/or transfers;A lot of help with bathing/dressing/bathroom;Assistance with cooking/housework;Assistance with feeding;Help with stairs or ramp for entrance;Assist for transportation   No  Equipment Recommendations  None recommended by PT    Recommendations for Other Services       Precautions / Restrictions Precautions Precautions: Fall Restrictions Weight Bearing Restrictions: No     Mobility  Bed Mobility               General bed mobility comments: patient declined due to feeling tired and wanting to sleep. she was agreeable to in bed exercises for strengthening    Transfers                        Ambulation/Gait                   Stairs             Wheelchair Mobility     Tilt Bed    Modified Rankin (Stroke Patients Only)       Balance                                             Cognition Arousal/Alertness: Awake/alert Behavior During Therapy: Flat affect Overall Cognitive Status: Within Functional Limits for tasks assessed                               Problem Solving: Slow processing, Requires verbal cues, Requires tactile cues          Exercises General Exercises - Lower Extremity Ankle Circles/Pumps: AROM, Strengthening, Both, 10 reps, Supine Heel Slides: AAROM, Strengthening, Both, 10 reps, Supine Hip ABduction/ADduction: AAROM, Strengthening, Both, 10 reps, Supine Straight Leg Raises: AAROM, Strengthening, Both, 10 reps, Supine Other Exercises Other Exercises: verbal and tactile cues for exercise technique for strengthening    General Comments General comments (skin integrity, edema, etc.): HR and SpO2 appropriate on room air      Pertinent Vitals/Pain Pain Assessment Pain Assessment: No/denies pain    Home Living                          Prior Function            PT Goals (current goals  can now be found in the care plan section) Acute Rehab PT Goals Patient Stated Goal: to go to rehab PT Goal Formulation: With patient Time For Goal Achievement: 08/01/22 Potential to Achieve Goals: Fair Progress towards PT goals: Progressing toward goals    Frequency    Min 1X/week      PT Plan Frequency needs to be updated (frequency adjusted due to implementation of patient centered delivery of care model initiation)    Co-evaluation              AM-PAC PT "6 Clicks" Mobility   Outcome Measure  Help needed turning from your back to your side while in a flat bed without using bedrails?: A Little Help needed moving from lying on your back to sitting on the side of a flat bed without using bedrails?: A Little Help needed moving to and from a bed to a chair (including a wheelchair)?: A Lot Help needed standing up from a chair using your arms (e.g., wheelchair or bedside  chair)?: A Little Help needed to walk in hospital room?: A Lot Help needed climbing 3-5 steps with a railing? : Total 6 Click Score: 14    End of Session   Activity Tolerance: Patient tolerated treatment well;Patient limited by fatigue Patient left: in bed;with call bell/phone within reach;with bed alarm set   PT Visit Diagnosis: Muscle weakness (generalized) (M62.81);Difficulty in walking, not elsewhere classified (R26.2)     Time: 8119-1478 PT Time Calculation (min) (ACUTE ONLY): 12 min  Charges:    $Therapeutic Exercise: 8-22 mins PT General Charges $$ ACUTE PT VISIT: 1 Visit                     Donna Bernard, PT, MPT   Ina Homes 07/23/2022, 3:19 PM

## 2022-07-23 NOTE — Progress Notes (Signed)
   07/23/22 2208  Vitals  Temp 98.8 F (37.1 C)  Temp Source Oral  BP (!) 93/53  MAP (mmHg) 66  BP Location Right Arm  BP Method Automatic  Patient Position (if appropriate) Lying  Pulse Rate 86  Pulse Rate Source Dinamap  Resp 16  MEWS COLOR  MEWS Score Color Green  Oxygen Therapy  SpO2 (!) 89 %  O2 Device Nasal Cannula;Room Air  MEWS Score  MEWS Temp 0  MEWS Systolic 1  MEWS Pulse 0  MEWS RR 0  MEWS LOC 0  MEWS Score 1   Post LR bolus. O2 at 1 liter placed via Nasal cannula

## 2022-07-23 NOTE — Sepsis Progress Note (Addendum)
Elink monitoring for the code sepsis protocol.  Secure chat sent to Dr. Para March requesting order for antibiotics. Per MD, waiting on labs before ordering more antibiotics, as patient is already receiving them for UTI.

## 2022-07-23 NOTE — Progress Notes (Signed)
1952-On call provider notification  S:  1908-RED MEWS:  151/67, HR 114, RR 17,  temp 102 oral  B:  72 y.o. 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  Family said he had not taken any of her medicines for about a week prior to admission. Blood cultures x 2 drawn 7/3 NGTD.  Urine culture 7/3 showed Klebsiella oxytoca.  Follow-up sensitivity report.  Change cephalexin to cefdinir today with plan to complete antibiotics on 08/04/2022   A:  patient c/o being cold, rigors present.  All blankets except one removed.  PO Tylenol given at 1908.  R:  Notification per protocol  2012-New orders received. Sepsis protocol initiated

## 2022-07-24 ENCOUNTER — Inpatient Hospital Stay: Payer: Medicare Other

## 2022-07-24 DIAGNOSIS — N390 Urinary tract infection, site not specified: Secondary | ICD-10-CM | POA: Diagnosis not present

## 2022-07-24 DIAGNOSIS — J189 Pneumonia, unspecified organism: Secondary | ICD-10-CM | POA: Diagnosis not present

## 2022-07-24 DIAGNOSIS — R410 Disorientation, unspecified: Secondary | ICD-10-CM | POA: Diagnosis not present

## 2022-07-24 DIAGNOSIS — A419 Sepsis, unspecified organism: Secondary | ICD-10-CM

## 2022-07-24 DIAGNOSIS — Z515 Encounter for palliative care: Secondary | ICD-10-CM | POA: Diagnosis not present

## 2022-07-24 DIAGNOSIS — R4182 Altered mental status, unspecified: Secondary | ICD-10-CM | POA: Diagnosis not present

## 2022-07-24 LAB — RESPIRATORY PANEL BY PCR

## 2022-07-24 LAB — URINALYSIS, ROUTINE W REFLEX MICROSCOPIC
Bilirubin Urine: NEGATIVE
Glucose, UA: NEGATIVE mg/dL
Ketones, ur: NEGATIVE mg/dL
Leukocytes,Ua: NEGATIVE
Nitrite: NEGATIVE
Protein, ur: 30 mg/dL — AB
Specific Gravity, Urine: 1.015 (ref 1.005–1.030)
Squamous Epithelial / HPF: NONE SEEN /HPF (ref 0–5)
pH: 8 (ref 5.0–8.0)

## 2022-07-24 LAB — PROTIME-INR
INR: 1.3 — ABNORMAL HIGH (ref 0.8–1.2)
Prothrombin Time: 16.1 seconds — ABNORMAL HIGH (ref 11.4–15.2)

## 2022-07-24 LAB — D-DIMER, QUANTITATIVE: D-Dimer, Quant: 5.28 ug/mL-FEU — ABNORMAL HIGH (ref 0.00–0.50)

## 2022-07-24 LAB — LACTIC ACID, PLASMA: Lactic Acid, Venous: 1.4 mmol/L (ref 0.5–1.9)

## 2022-07-24 LAB — APTT: aPTT: 46 seconds — ABNORMAL HIGH (ref 24–36)

## 2022-07-24 MED ORDER — PROSOURCE PLUS PO LIQD
30.0000 mL | Freq: Three times a day (TID) | ORAL | Status: DC
  Administered 2022-07-24 – 2022-07-30 (×10): 30 mL via ORAL
  Filled 2022-07-24: qty 30

## 2022-07-24 MED ORDER — VITAMIN C 500 MG PO TABS
500.0000 mg | ORAL_TABLET | Freq: Two times a day (BID) | ORAL | Status: DC
  Administered 2022-07-24 – 2022-08-01 (×17): 500 mg via ORAL
  Filled 2022-07-24 (×17): qty 1

## 2022-07-24 MED ORDER — IOHEXOL 350 MG/ML SOLN
60.0000 mL | Freq: Once | INTRAVENOUS | Status: AC | PRN
  Administered 2022-07-24: 60 mL via INTRAVENOUS

## 2022-07-24 MED ORDER — ZINC SULFATE 220 (50 ZN) MG PO CAPS
220.0000 mg | ORAL_CAPSULE | Freq: Every day | ORAL | Status: DC
  Administered 2022-07-24 – 2022-08-01 (×9): 220 mg via ORAL
  Filled 2022-07-24 (×9): qty 1

## 2022-07-24 NOTE — Progress Notes (Addendum)
Progress Note    RAYCHELLE HUDMAN  ZOX:[id number] DOB: May 30, 1950  DOA: 07/18/2022 PCP: Corky Downs, MD      Brief Narrative:    Medical records reviewed and are as summarized below:  [name] is a 72 y.o. 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  Family said he had not taken any of her medicines for about a week prior to admission.      Assessment/Plan:   Principal Problem:   Altered mental status Active Problems:   AKI (acute kidney injury) (HCC)   Electrolyte abnormality   Generalized weakness   QT prolongation   Elevated troponin   Diarrhea   Malignant neoplasm metastatic to bone (HCC)   Primary hypertension   Sepsis (HCC)   Hypokalemia   Hypophosphatemia   Acute metabolic encephalopathy   Malnutrition of moderate degree   Acute UTI   Pressure injury of skin   Pneumonia   Body mass index is 31.35 kg/m.  (Obesity)   Acute metabolic encephalopathy: Worsened.  Patient is more confused today.   Sepsis secondary to right upper lobe pneumonia: Continue IV cefepime and vancomycin.  Follow-up blood cultures.  Discontinue IV fluids to avoid fluid overload.   Acute UTI: Urine culture showed Klebsiella oxytoca.  Initial plan was to complete cefdinir on 08/04/2022.  However, she has been restarted on IV antibiotics for sepsis and pneumonia.     AKI on CKD stage IIIb: Creatinine is stable.   Hypokalemia: Improved.  Continue potassium repletion.   Hypophosphatemia: Improved   Prolonged QTc at 582 on 07/18/2022.  Repeat EKG on 07/23/2022 showed improvement in QTc to 458   Elevated troponins: This is likely due to demand ischemia.   Chronic diarrhea: Imodium as needed   Stage IV breast cancer with metastasis to the  bone.  Outpatient follow-up with [name], oncologist. Per [name], okay to suspend chemotherapy so she can go to SNF for rehab (discussed via secure chat).   Stage II coccygeal decubitus ulcer (present on admission): Continue local wound care.  Prognosis is guarded.  Frederico Hamman, NP, with palliative care to discuss goals of care. Plan discussed with [name], son, over the phone  Diet Order             DIET DYS 3 Room service appropriate? Yes with Assist; Fluid consistency: Thin  Diet effective now                            Consultants: None  Procedures: None    Medications:    (feeding supplement) PROSource Plus  30 mL Oral TID BM   vitamin C  500 mg Oral BID   aspirin EC  81 mg Oral QHS   enoxaparin (LOVENOX) injection  40 mg Subcutaneous Q24H   exemestane  25 mg Oral QPC breakfast   feeding supplement  237 mL Oral TID BM   folic acid  1 mg Oral Daily   levothyroxine  100 mcg Oral Q0600   metoprolol tartrate  12.5 mg Oral BID   multivitamin with minerals  1 tablet Oral Daily   pantoprazole  20 mg Oral Daily   potassium chloride  40 mEq Oral Daily   rosuvastatin  5 mg Oral Daily  sodium chloride flush  3 mL Intravenous Q12H   thiamine  100 mg Oral Daily   traZODone  50 mg Oral QHS   zinc sulfate  220 mg Oral Daily   Continuous Infusions:  ceFEPime (MAXIPIME) IV 2 g (07/24/22 1610)   [START ON 07/25/2022] vancomycin        Anti-infectives (From admission, onward)    Start     Dose/Rate Route Frequency Ordered Stop   07/25/22 0000  vancomycin (VANCOCIN) IVPB 1000 mg/200 mL premix       See Hyperspace for full Linked Orders Report.   1,000 mg 200 mL/hr over 60 Minutes Intravenous Every 24 hours 07/23/22 2327     07/24/22 0600  ceFEPIme (MAXIPIME) 2 g in sodium chloride 0.9 % 100 mL IVPB        2 g 200 mL/hr over 30 Minutes Intravenous Every 12 hours 07/23/22 2329     07/24/22 0015  vancomycin (VANCOREADY) IVPB 2000 mg/400 mL       See  Hyperspace for full Linked Orders Report.   2,000 mg 200 mL/hr over 120 Minutes Intravenous  Once 07/23/22 2327 07/24/22 0249   07/24/22 0000  metroNIDAZOLE (FLAGYL) IVPB 500 mg  Status:  Discontinued        500 mg 100 mL/hr over 60 Minutes Intravenous Every 12 hours 07/23/22 2309 07/24/22 0545   07/23/22 2200  cefdinir (OMNICEF) capsule 300 mg  Status:  Discontinued        300 mg Oral Every 12 hours 07/23/22 1220 07/23/22 2329   07/22/22 2200  cephALEXin (KEFLEX) capsule 500 mg  Status:  Discontinued        500 mg Oral Every 12 hours 07/21/22 1409 07/23/22 1220   07/18/22 2200  cefTRIAXone (ROCEPHIN) 1 g in sodium chloride 0.9 % 100 mL IVPB        1 g 200 mL/hr over 30 Minutes Intravenous Every 24 hours 07/18/22 2104 07/21/22 2220              Family Communication/Anticipated D/C date and plan/Code Status   DVT prophylaxis: enoxaparin (LOVENOX) injection 40 mg Start: 07/19/22 2200     Code Status: Full Code  Family Communication: Plan discussed with [name], son, over the phone Disposition Plan: Plan to discharge to SNF   Status is: Inpatient Remains inpatient appropriate because:Awaiting placement to SNF       Subjective:   Interval events noted.  Patient developed a fever (Tmax 103.1 F ) and tachycardia last night which triggered a code sepsis.  Today, she does not report any complaints but she is confused.  No fever.  She does not remember what happened last night.  Objective:    Vitals:   07/24/22 0106 07/24/22 0457 07/24/22 0500 07/24/22 0801  BP: 127/65 129/62  (!) 118/51  Pulse: 98 (!) 104  (!) 106  Resp: 16   20  Temp: 98.3 F (36.8 C) 98.9 F (37.2 C)  99.4 F (37.4 C)  TempSrc: Oral Oral  Oral  SpO2: 91% 92%  90%  Weight:   88.1 kg   Height:       No data found.   Intake/Output Summary (Last 24 hours) at 07/24/2022 1445 Last data filed at 07/24/2022 1328 Gross per 24 hour  Intake 984.37 ml  Output 1000 ml  Net -15.63 ml   Filed Weights    07/21/22 0500 07/22/22 0108 07/24/22 0500  Weight: 92.6 kg 90.3 kg 88.1 kg    Exam:  GEN: NAD  SKIN: Warm and dry EYES: No pallor or icterus ENT: MMM CV: RRR PULM: CTA B ABD: soft, ND, NT, +BS CNS: AAO x 2 (person and place), non focal EXT: No edema or tenderness      Data Reviewed:   I have personally reviewed following labs and imaging studies:  Labs: Labs show the following:   Basic Metabolic Panel: Recent Labs  Lab 07/18/22 1859 07/19/22 0657 07/20/22 0416 07/21/22 0630 07/22/22 0841 07/23/22 0633 07/23/22 2228  NA  --  131* 136 140 137 137 135  K  --  2.5* 2.8* 3.4* 3.1* 3.4* 3.7  CL  --  92* 101 103 100 104 103  CO2  --  26 26 26 25 25 24   GLUCOSE  --  113* 109* 107* 111* 104* 115*  BUN  --  16 13 12 15 14 16   CREATININE  --  1.80* 1.63* 1.56* 1.53* 1.54* 1.52*  CALCIUM  --  8.5* 8.0* 8.5* 8.0* 8.3* 8.3*  MG 1.8 2.1 1.8 1.7  --  2.0  --   PHOS  --  1.3* 2.8 1.9* 2.6  --   --    GFR Estimated Creatinine Clearance: 36.8 mL/min (A) (by C-G formula based on SCr of 1.52 mg/dL (H)). Liver Function Tests: Recent Labs  Lab 07/18/22 1652 07/22/22 0841 07/23/22 2228  AST 85*  --  295*  ALT 44  --  121*  ALKPHOS 118  --  117  BILITOT 1.3*  --  0.4  PROT 7.3  --  5.4*  ALBUMIN 3.6 2.7* 2.3*   No results for input(s): "LIPASE", "AMYLASE" in the last 168 hours. No results for input(s): "AMMONIA" in the last 168 hours. Coagulation profile Recent Labs  Lab 07/18/22 1708 07/23/22 2118 07/23/22 2228  INR 1.2 1.8* 1.3*    CBC: Recent Labs  Lab 07/18/22 1652 07/19/22 0657 07/23/22 2228  WBC 6.4 5.3 3.8*  NEUTROABS 5.5  --  3.0  HGB 11.2* 10.9* 8.5*  HCT 33.6* 32.5* 26.4*  MCV 81.4 81.5 82.5  PLT 215 179 146*   Cardiac Enzymes: No results for input(s): "CKTOTAL", "CKMB", "CKMBINDEX", "TROPONINI" in the last 168 hours. BNP (last 3 results) No results for input(s): "PROBNP" in the last 8760 hours. CBG: Recent Labs  Lab 07/19/22 0725  GLUCAP  104*   D-Dimer: Recent Labs    07/23/22 2118 07/23/22 2228  DDIMER 1.79* 5.28*   Hgb A1c: No results for input(s): "HGBA1C" in the last 72 hours. Lipid Profile: No results for input(s): "CHOL", "HDL", "LDLCALC", "TRIG", "CHOLHDL", "LDLDIRECT" in the last 72 hours. Thyroid function studies: No results for input(s): "TSH", "T4TOTAL", "T3FREE", "THYROIDAB" in the last 72 hours.  Invalid input(s): "FREET3"  Anemia work up: No results for input(s): "VITAMINB12", "FOLATE", "FERRITIN", "TIBC", "IRON", "RETICCTPCT" in the last 72 hours. Sepsis Labs: Recent Labs  Lab 07/18/22 1652 07/18/22 1859 07/19/22 0657 07/23/22 2223 07/23/22 2228 07/24/22 0007  PROCALCITON  --   --   --   --  0.54  --   WBC 6.4  --  5.3  --  3.8*  --   LATICACIDVEN 2.7* 1.5  --  1.5  --  1.4    Microbiology Recent Results (from the past 240 hour(s))  Culture, blood (Routine x 2)     Status: None   Collection Time: 07/18/22  4:52 PM   Specimen: BLOOD  Result Value Ref Range Status   Specimen Description BLOOD RIGHT ANTECUBITAL  Final   Special Requests  Final    BOTTLES DRAWN AEROBIC AND ANAEROBIC Blood Culture results may not be optimal due to an excessive volume of blood received in culture bottles   Culture   Final    NO GROWTH 5 DAYS Performed at [facility], [address] [place], [place] [address]    Report Status 07/23/2022 FINAL  Final  Culture, blood (Routine x 2)     Status: None   Collection Time: 07/18/22  5:08 PM   Specimen: BLOOD  Result Value Ref Range Status   Specimen Description BLOOD BLOOD RIGHT ARM  Final   Special Requests   Final    BOTTLES DRAWN AEROBIC AND ANAEROBIC Blood Culture adequate volume   Culture   Final    NO GROWTH 5 DAYS Performed at [facility], [address] [place], [place] [address]    Report Status 07/23/2022 FINAL  Final  Urine Culture (for pregnant, neutropenic or urologic patients or patients with an indwelling urinary  catheter)     Status: Abnormal   Collection Time: 07/18/22  8:30 PM   Specimen: Urine, Clean Catch  Result Value Ref Range Status   Specimen Description   Final    URINE, CLEAN CATCH Performed at [facility], [address], [place], [place] [address]    Special Requests   Final    NONE Performed at [facility], [address], [place], [place] [address]    Culture >=100,000 COLONIES/mL KLEBSIELLA OXYTOCA (A)  Final   Report Status 07/21/2022 FINAL  Final   Organism ID, Bacteria KLEBSIELLA OXYTOCA (A)  Final      Susceptibility   Klebsiella oxytoca - MIC*    AMPICILLIN >=32 RESISTANT Resistant     CEFEPIME <=0.12 SENSITIVE Sensitive     CEFTRIAXONE <=0.25 SENSITIVE Sensitive     CIPROFLOXACIN <=0.25 SENSITIVE Sensitive     GENTAMICIN <=1 SENSITIVE Sensitive     IMIPENEM <=0.25 SENSITIVE Sensitive     NITROFURANTOIN <=16 SENSITIVE Sensitive     TRIMETH/SULFA <=20 SENSITIVE Sensitive     AMPICILLIN/SULBACTAM 16 INTERMEDIATE Intermediate     PIP/TAZO <=4 SENSITIVE Sensitive     * >=100,000 COLONIES/mL KLEBSIELLA OXYTOCA  Respiratory (~20 pathogens) panel by PCR     Status: None   Collection Time: 07/23/22  9:10 PM   Specimen: Nasopharyngeal Swab; Respiratory  Result Value Ref Range Status   Adenovirus NOT DETECTED NOT DETECTED Final   Coronavirus 229E NOT DETECTED NOT DETECTED Final    Comment: (NOTE) The Coronavirus on the Respiratory Panel, DOES NOT test for the novel  Coronavirus (2019 nCoV)    Coronavirus HKU1 NOT DETECTED NOT DETECTED Final   Coronavirus NL63 NOT DETECTED NOT DETECTED Final   Coronavirus OC43 NOT DETECTED NOT DETECTED Final   Metapneumovirus NOT DETECTED NOT DETECTED Final   Rhinovirus / Enterovirus NOT DETECTED NOT DETECTED Final   Influenza A NOT DETECTED NOT DETECTED Final   Influenza B NOT DETECTED NOT DETECTED Final   Parainfluenza Virus 1 NOT DETECTED NOT DETECTED Final   Parainfluenza Virus 2 NOT DETECTED NOT DETECTED Final    Parainfluenza Virus 3 NOT DETECTED NOT DETECTED Final   Parainfluenza Virus 4 NOT DETECTED NOT DETECTED Final   Respiratory Syncytial Virus NOT DETECTED NOT DETECTED Final   Bordetella pertussis NOT DETECTED NOT DETECTED Final   Bordetella Parapertussis NOT DETECTED NOT DETECTED Final   Chlamydophila pneumoniae NOT DETECTED NOT DETECTED Final   Mycoplasma pneumoniae NOT DETECTED NOT DETECTED Final  Comment: Performed at [facility], [address] [place], [place] [address]  Culture, blood (x 2)     Status: None (Preliminary result)   Collection Time: 07/23/22  9:18 PM   Specimen: BLOOD  Result Value Ref Range Status   Specimen Description BLOOD RIGHT ANTECUBITAL  Final   Special Requests   Final    BOTTLES DRAWN AEROBIC AND ANAEROBIC Blood Culture adequate volume   Culture   Final    NO GROWTH < 12 HOURS Performed at [facility], [address] [place], [place] [address]    Report Status PENDING  Incomplete  Culture, blood (x 2)     Status: None (Preliminary result)   Collection Time: 07/23/22  9:18 PM   Specimen: BLOOD  Result Value Ref Range Status   Specimen Description BLOOD BLOOD RIGHT HAND  Final   Special Requests   Final    BOTTLES DRAWN AEROBIC AND ANAEROBIC Blood Culture adequate volume   Culture   Final    NO GROWTH < 12 HOURS Performed at [facility], [address] [place], [place] [address]    Report Status PENDING  Incomplete    Procedures and diagnostic studies:  CT ABDOMEN PELVIS W CONTRAST  Result Date: 07/24/2022 CLINICAL DATA:  Sepsis. Stage IV carcinoma of the breast with bony Mets. EXAM: CT ABDOMEN AND PELVIS WITH CONTRAST TECHNIQUE: Multidetector CT imaging of the abdomen and pelvis was performed using the standard protocol following bolus administration of intravenous contrast. RADIATION DOSE REDUCTION: This exam was performed according to the departmental dose-optimization program which includes automated exposure  control, adjustment of the mA and/or kV according to patient size and/or use of iterative reconstruction technique. CONTRAST:  60mL OMNIPAQUE IOHEXOL 350 MG/ML SOLN COMPARISON:  PET-CT 03/29/2022. FINDINGS: Lower chest: There is dependent atelectasis in the bilateral lower lobes. There some patchy ground-glass opacities in the right middle lobe. There is a calcified granuloma in the right lower lobe. Hepatobiliary: No focal liver abnormality is seen. No gallstones, gallbladder wall thickening, or biliary dilatation. Pancreas: Unremarkable. No pancreatic ductal dilatation or surrounding inflammatory changes. Spleen: Normal in size without focal abnormality. Adrenals/Urinary Tract: There are 2 left renal cysts. The largest measures 5.6 cm. Otherwise, the kidneys, adrenal glands, and bladder are within normal limits. Stomach/Bowel: Stomach is within normal limits. Appendix appears normal. No evidence of bowel wall thickening, distention, or inflammatory changes. There is sigmoid colon diverticulosis. Vascular/Lymphatic: Aortic atherosclerosis. No enlarged abdominal or pelvic lymph nodes. Reproductive: Uterus and bilateral adnexa are unremarkable. Other: There is new presacral edema. There is no ascites or focal abdominal wall hernia. There is mild body wall edema. Small amount of subcutaneous air in the lower anterior abdominal wall is likely related to medication injection sites. Musculoskeletal: Osseous metastatic lesions are again noted most significant in the left iliac wing and L5 vertebral body similar to the prior study. IMPRESSION: 1. New presacral edema and mild body wall edema. 2. New patchy ground-glass opacities in the right middle lobe may represent infectious/inflammatory process. 3. Stable osseous metastatic disease. 4. Left Bosniak I benign renal cyst measuring 5.6 cm. No follow-up imaging is recommended. JACR 2018 Feb; 264-273, Management of the Incidental Renal Mass on CT, [employer] 2021; [id number],  Bosniak Classification of Cystic Renal Masses, Version 2019. Aortic Atherosclerosis (ICD10-I70.0). Electronically Signed   By: Darliss Cheney M.D.   On: 07/24/2022 01:23   CT Angio Chest Pulmonary Embolism (PE) W or WO Contrast  Result Date: 07/24/2022 CLINICAL DATA:  Pulmonary embolism (PE) suspected, low to intermediate prob, neg D-dimer. Metastatic breast cancer. History of pulmonary embolus. Generalized weakness EXAM: CT ANGIOGRAPHY CHEST WITH CONTRAST TECHNIQUE: Multidetector CT imaging of the chest was performed using the standard protocol during bolus administration of intravenous contrast. Multiplanar CT image reconstructions and MIPs were obtained to evaluate the vascular anatomy. RADIATION DOSE REDUCTION: This exam was performed according to the departmental dose-optimization program which includes automated exposure control, adjustment of the mA and/or kV according to patient size and/or use of iterative reconstruction technique. CONTRAST:  60mL OMNIPAQUE IOHEXOL 350 MG/ML SOLN COMPARISON:  PET CT 03/29/2022 FINDINGS: Cardiovascular: Heart is normal size. Aorta is normal caliber. Scattered aortic calcifications. No filling defects in the pulmonary arteries to suggest pulmonary emboli. Mediastinum/Nodes: No mediastinal, hilar, or axillary adenopathy. Trachea and esophagus are unremarkable. Thyroid unremarkable. Lungs/Pleura: Mild centrilobular emphysema. Dependent opacities in the lungs compatible with dependent atelectasis. Calcified granuloma in the right lower lobe. Airspace opacity noted in the posterior right upper lobe. Cannot exclude pneumonia. No effusions. Upper Abdomen: No acute findings Musculoskeletal: Prior left mastectomy. Chest wall soft tissues are unremarkable. Bilateral scapular osseous lesions compatible with metastases. Review of the MIP images confirms the above findings. IMPRESSION: No evidence of pulmonary embolus. Dependent atelectasis. Airspace opacity noted in the posterior  right upper lobe concerning for pneumonia. Osseous metastatic disease involving the scapulae. Aortic Atherosclerosis (ICD10-I70.0) and Emphysema (ICD10-J43.9). Electronically Signed   By: Charlett Nose M.D.   On: 07/24/2022 01:02   DG Chest Port 1 View  Result Date: 07/23/2022 CLINICAL DATA:  Sepsis. EXAM: PORTABLE CHEST 1 VIEW COMPARISON:  Chest radiograph dated 07/18/2022. FINDINGS: No focal consolidation, pleural effusion, pneumothorax. Mild diffuse chronic intra coarsening. The cardiac silhouette is within normal limits. No acute osseous pathology. IMPRESSION: No active disease. Electronically Signed   By: Elgie Collard M.D.   On: 07/23/2022 20:42               LOS: 4 days   Donaldo Teegarden  [facility]   Pager on [email or web address]. If 7PM-7AM, please contact night-coverage at [email or web address]     07/24/2022, 2:45 PM

## 2022-07-24 NOTE — Care Management Important Message (Signed)
Important Message  Patient Details  Name: Alicia [name] MRN: [id number] Date of Birth: 1950-06-06   Medicare Important Message Given:  N/A - LOS <3 / Initial given by admissions     Olegario Messier A Jered Heiny 07/24/2022, 8:31 AM

## 2022-07-24 NOTE — Consult Note (Signed)
Palliative Medicine [facility] at [facility] [phone] [phone]) [phone]   Name: Alicia [name] Date: 07/24/2022 MRN: [id number]  DOB: 1950/09/18  [name], [name], [name], [name] Jim Like, RN as Registered Nurse Jeralyn Ruths, MD as Consulting Physician (Oncology) Carmina Miller, MD as Referring Physician (Radiation Oncology) Chriss Driver, RN as Registered Nurse    REASON FOR CONSULTATION: Alicia [name] is a 72 y.o. 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  Patient with evidence of worsening bone metastasis on PET scan in March 2024 and patient was rotated from [place] and [employer] to [employer] for and Aromasin.  Patient was admitted to hospital 07/18/2022 with altered mental status secondary to sepsis from pneumonia and UTI.  Palliative care was consulted to address goals.  SOCIAL HISTORY:     reports that she quit smoking about 5 years ago. Her smoking use included cigarettes. She has a 30.00 pack-year smoking history. She has never used smokeless tobacco. She reports that she does not currently use alcohol. She reports that she does not use drugs.  Patient is unmarried.  She lives at home alone.  She has a son who lives nearby.  ADVANCE DIRECTIVES:  Not on file  CODE STATUS: Full code  PAST MEDICAL HISTORY: Past Medical History:  Diagnosis Date   Anxiety    Breast cancer, left (HCC) 07/2017   Depression    Hypertension    Hypothyroidism    Personal history of chemotherapy 2019   LEFT mastectomy-chemo before   Personal history of radiation therapy 03/2018   LEFT mastectomy   Rapid heart rate    Thyroid disease     PAST SURGICAL HISTORY:  Past Surgical History:  Procedure Laterality Date   AXILLARY LYMPH NODE BIOPSY  Left 07/16/2017   METASTATIC MAMMARY CARCINOMA   BREAST BIOPSY Left 07/16/2017   [place] bx of left breast mass and left breast LN.  INVASIVE MAMMARY CARCINOMA, NO SPECIAL TYPE.    BREAST EXCISIONAL BIOPSY Right 2001   benign   BREAST LUMPECTOMY WITH SENTINEL LYMPH NODE BIOPSY Left 12/06/2017   Procedure: BREAST LUMPECTOMY WITH SENTINEL LYMPH NODE BX;  Surgeon: Earline Mayotte, MD;  Location: [facility] ORS;  Service: General;  Laterality: Left;   COLONOSCOPY     MASTECTOMY Left 12/23/2017   PORTACATH PLACEMENT Right 08/07/2017   Procedure: INSERTION PORT-A-CATH;  Surgeon: Earline Mayotte, MD;  Location: [facility] ORS;  Service: General;  Laterality: Right;   SIMPLE MASTECTOMY WITH AXILLARY SENTINEL NODE BIOPSY Left 12/23/2017   T2,N2 with 6/7 nodes positive. Whole breast radiation.  Surgeon: Earline Mayotte, MD;  Location: [facility] ORS;  Service: General;  Laterality: Left;   TONSILLECTOMY      HEMATOLOGY/ONCOLOGY HISTORY:  Oncology History Overview Note  Patient is a 72 year old female who recently self palpated a mass on her left breast.  Subsequent imaging and biopsy revealed the above-stated breast cancer.  Case was also discussed extensively at case conference.  Given the size and the stage of patient's malignancy, she will benefit from neoadjuvant chemotherapy using Adriamycin, Cytoxan, and Taxol.  Patient will also require Neulasta support.  Will get CT scan of the chest, abdomen, and pelvis to assess for any metastatic disease.  Patient will also require port placement and MUGA prior to initiating treatment  CT abdomen/pelvis/chest did not  reveal any suspicious lesions concerning for metastatic disease. (08/01/17)  Port-A-Cath placed on 08/07/2017.  Cycle 1 day 1 of AC was given on 08/08/17.     Breast cancer, stage 2, left (HCC)  07/24/2017 Initial Diagnosis   Breast cancer, stage 2, left (HCC)   07/30/2017 Cancer Staging   Staging form: Breast, AJCC 8th Edition - Clinical stage from  07/30/2017: Stage IIA (cT2, cN1, cM0, G2, ER+, PR+, HER2-) - Signed by Jeralyn Ruths, MD on 07/30/2017   08/08/2017 - 10/31/2017 Chemotherapy   The patient had DOXOrubicin (ADRIAMYCIN) chemo injection 130 mg, 60 mg/m2 = 130 mg, Intravenous,  Once, 4 of 4 cycles Administration: 130 mg (08/08/2017), 130 mg (08/22/2017), 130 mg (09/05/2017), 130 mg (09/19/2017) palonosetron (ALOXI) injection 0.25 mg, 0.25 mg, Intravenous,  Once, 4 of 4 cycles Administration: 0.25 mg (08/08/2017), 0.25 mg (08/22/2017), 0.25 mg (09/05/2017), 0.25 mg (09/19/2017) pegfilgrastim-cbqv (UDENYCA) injection 6 mg, 6 mg, Subcutaneous, Once, 4 of 4 cycles Administration: 6 mg (08/09/2017), 6 mg (08/23/2017), 6 mg (09/06/2017), 6 mg (09/20/2017) cyclophosphamide (CYTOXAN) 1,300 mg in sodium chloride 0.9 % 250 mL chemo infusion, 600 mg/m2 = 1,300 mg, Intravenous,  Once, 4 of 4 cycles Administration: 1,300 mg (08/08/2017), 1,300 mg (08/22/2017), 1,300 mg (09/05/2017), 1,300 mg (09/19/2017) PACLitaxel (TAXOL) 174 mg in sodium chloride 0.9 % 250 mL chemo infusion (</= 80mg /m2), 80 mg/m2 = 174 mg, Intravenous,  Once, 4 of 12 cycles Dose modification: 72 mg/m2 (original dose 80 mg/m2, Cycle 6, Reason: Dose not tolerated) Administration: 174 mg (10/03/2017), 156 mg (10/17/2017), 156 mg (10/24/2017), 156 mg (10/31/2017) fosaprepitant (EMEND) 150 mg, dexamethasone (DECADRON) 12 mg in sodium chloride 0.9 % 145 mL IVPB, , Intravenous,  Once, 4 of 4 cycles Administration:  (08/08/2017),  (08/22/2017),  (09/05/2017),  (09/19/2017)  for chemotherapy treatment.      ALLERGIES:  is allergic to sulfa antibiotics.  MEDICATIONS:  Current Facility-Administered Medications  Medication Dose Route Frequency Provider Last Rate Last Admin   (feeding supplement) PROSource Plus liquid 30 mL  30 mL Oral TID BM Lurene Shadow, MD   30 mL at 07/24/22 1513   acetaminophen (TYLENOL) tablet 650 mg  650 mg Oral Q6H PRN Verdene Lennert, MD   650 mg at 07/23/22 1610   Or    acetaminophen (TYLENOL) suppository 650 mg  650 mg Rectal Q6H PRN Verdene Lennert, MD       ALPRAZolam Prudy Feeler) tablet 0.25 mg  0.25 mg Oral BID PRN Enedina Finner, MD   0.25 mg at 07/23/22 2008   ascorbic acid (VITAMIN C) tablet 500 mg  500 mg Oral BID Lurene Shadow, MD   500 mg at 07/24/22 1513   aspirin EC tablet 81 mg  81 mg Oral QHS Verdene Lennert, MD   81 mg at 07/23/22 2008   ceFEPIme (MAXIPIME) 2 g in sodium chloride 0.9 % 100 mL IVPB  2 g Intravenous Q12H Otelia Sergeant, RPH 200 mL/hr at 07/24/22 0611 2 g at 07/24/22 0611   enoxaparin (LOVENOX) injection 40 mg  40 mg Subcutaneous Q24H Mila Merry A, RPH   40 mg at 07/23/22 2008   exemestane (AROMASIN) tablet 25 mg  25 mg Oral QPC breakfast Lurene Shadow, MD   25 mg at 07/24/22 0946   feeding supplement (ENSURE ENLIVE / ENSURE PLUS) liquid 237 mL  237 mL Oral TID BM Lurene Shadow, MD   237 mL at 07/24/22 0943   folic acid (FOLVITE) tablet 1 mg  1 mg Oral Daily Verdene Lennert, MD  1 mg at 07/24/22 0944   HYDROcodone-acetaminophen (NORCO/VICODIN) 5-325 MG per tablet 1 tablet  1 tablet Oral Q8H PRN Verdene Lennert, MD       levothyroxine (SYNTHROID) tablet 100 mcg  100 mcg Oral Q0600 Verdene Lennert, MD   100 mcg at 07/23/22 0542   loperamide (IMODIUM) capsule 2 mg  2 mg Oral QID PRN Lurene Shadow, MD   2 mg at 07/20/22 1643   metoprolol tartrate (LOPRESSOR) tablet 12.5 mg  12.5 mg Oral BID Enedina Finner, MD   12.5 mg at 07/24/22 0944   multivitamin with minerals tablet 1 tablet  1 tablet Oral Daily Verdene Lennert, MD   1 tablet at 07/24/22 0945   pantoprazole (PROTONIX) EC tablet 20 mg  20 mg Oral Daily Verdene Lennert, MD   20 mg at 07/24/22 0944   potassium chloride SA (KLOR-CON M) CR tablet 40 mEq  40 mEq Oral Daily Lurene Shadow, MD   40 mEq at 07/24/22 0944   rosuvastatin (CRESTOR) tablet 5 mg  5 mg Oral Daily Lurene Shadow, MD   5 mg at 07/24/22 0944   sodium chloride (OCEAN) 0.65 % nasal spray 1 spray  1 spray Each Nare PRN  Lurene Shadow, MD       sodium chloride flush (NS) 0.9 % injection 3 mL  3 mL Intravenous Q12H Verdene Lennert, MD   3 mL at 07/24/22 0945   thiamine (VITAMIN B1) tablet 100 mg  100 mg Oral Daily Verdene Lennert, MD   100 mg at 07/24/22 0944   traMADol (ULTRAM) tablet 50 mg  50 mg Oral Q6H PRN Lurene Shadow, MD       traZODone (DESYREL) tablet 50 mg  50 mg Oral QHS Lurene Shadow, MD   50 mg at 07/23/22 2008   [START ON 07/25/2022] vancomycin (VANCOCIN) IVPB 1000 mg/200 mL premix  1,000 mg Intravenous Q24H Otelia Sergeant, RPH       zinc sulfate capsule 220 mg  220 mg Oral Daily Lurene Shadow, MD   220 mg at 07/24/22 1513    VITAL SIGNS: BP (!) 118/51 (BP Location: Right Arm)   Pulse (!) 106   Temp 99.4 F (37.4 C) (Oral)   Resp 20   Ht 5\' 6"  (1.676 m)   Wt 194 lb 3.6 oz (88.1 kg)   SpO2 90%   BMI 31.35 kg/m  Filed Weights   07/21/22 0500 07/22/22 0108 07/24/22 0500  Weight: 204 lb 2.3 oz (92.6 kg) 199 lb 1.2 oz (90.3 kg) 194 lb 3.6 oz (88.1 kg)    Estimated body mass index is 31.35 kg/m as calculated from the following:   Height as of this encounter: 5\' 6"  (1.676 m).   Weight as of this encounter: 194 lb 3.6 oz (88.1 kg).  LABS: CBC:    Component Value Date/Time   WBC 3.8 (L) 07/23/2022 2228   HGB 8.5 (L) 07/23/2022 2228   HGB 10.3 (L) 05/21/2022 1256   HGB 14.2 02/11/2011 1349   HCT 26.4 (L) 07/23/2022 2228   HCT 40.9 02/11/2011 1349   PLT 146 (L) 07/23/2022 2228   PLT 129 (L) 05/21/2022 1256   PLT 151 02/11/2011 1349   MCV 82.5 07/23/2022 2228   MCV 89 02/11/2011 1349   NEUTROABS 3.0 07/23/2022 2228   LYMPHSABS 0.2 (L) 07/23/2022 2228   MONOABS 0.4 07/23/2022 2228   EOSABS 0.1 07/23/2022 2228   BASOSABS 0.0 07/23/2022 2228   Comprehensive Metabolic Panel:    Component Value Date/Time  NA 135 07/23/2022 2228   NA 142 02/11/2011 1349   K 3.7 07/23/2022 2228   K 3.8 02/11/2011 1349   CL 103 07/23/2022 2228   CL 107 02/11/2011 1349   CO2 24 07/23/2022 2228    CO2 24 02/11/2011 1349   BUN 16 07/23/2022 2228   BUN 14 02/11/2011 1349   CREATININE 1.52 (H) 07/23/2022 2228   CREATININE 1.37 (H) 06/14/2022 1400   CREATININE 0.65 02/11/2011 1349   GLUCOSE 115 (H) 07/23/2022 2228   GLUCOSE 98 02/11/2011 1349   CALCIUM 8.3 (L) 07/23/2022 2228   CALCIUM 9.0 02/11/2011 1349   AST 295 (H) 07/23/2022 2228   AST 32 06/14/2022 1400   ALT 121 (H) 07/23/2022 2228   ALT 21 06/14/2022 1400   ALT 36 02/11/2011 1349   ALKPHOS 117 07/23/2022 2228   ALKPHOS 122 02/11/2011 1349   BILITOT 0.4 07/23/2022 2228   BILITOT 0.9 06/14/2022 1400   PROT 5.4 (L) 07/23/2022 2228   PROT 7.0 02/11/2011 1349   ALBUMIN 2.3 (L) 07/23/2022 2228   ALBUMIN 4.1 02/11/2011 1349    RADIOGRAPHIC STUDIES: CT ABDOMEN PELVIS W CONTRAST  Result Date: 07/24/2022 CLINICAL DATA:  Sepsis. Stage IV carcinoma of the breast with bony Mets. EXAM: CT ABDOMEN AND PELVIS WITH CONTRAST TECHNIQUE: Multidetector CT imaging of the abdomen and pelvis was performed using the standard protocol following bolus administration of intravenous contrast. RADIATION DOSE REDUCTION: This exam was performed according to the departmental dose-optimization program which includes automated exposure control, adjustment of the mA and/or kV according to patient size and/or use of iterative reconstruction technique. CONTRAST:  60mL OMNIPAQUE IOHEXOL 350 MG/ML SOLN COMPARISON:  PET-CT 03/29/2022. FINDINGS: Lower chest: There is dependent atelectasis in the bilateral lower lobes. There some patchy ground-glass opacities in the right middle lobe. There is a calcified granuloma in the right lower lobe. Hepatobiliary: No focal liver abnormality is seen. No gallstones, gallbladder wall thickening, or biliary dilatation. Pancreas: Unremarkable. No pancreatic ductal dilatation or surrounding inflammatory changes. Spleen: Normal in size without focal abnormality. Adrenals/Urinary Tract: There are 2 left renal cysts. The largest  measures 5.6 cm. Otherwise, the kidneys, adrenal glands, and bladder are within normal limits. Stomach/Bowel: Stomach is within normal limits. Appendix appears normal. No evidence of bowel wall thickening, distention, or inflammatory changes. There is sigmoid colon diverticulosis. Vascular/Lymphatic: Aortic atherosclerosis. No enlarged abdominal or pelvic lymph nodes. Reproductive: Uterus and bilateral adnexa are unremarkable. Other: There is new presacral edema. There is no ascites or focal abdominal wall hernia. There is mild body wall edema. Small amount of subcutaneous air in the lower anterior abdominal wall is likely related to medication injection sites. Musculoskeletal: Osseous metastatic lesions are again noted most significant in the left iliac wing and L5 vertebral body similar to the prior study. IMPRESSION: 1. New presacral edema and mild body wall edema. 2. New patchy ground-glass opacities in the right middle lobe may represent infectious/inflammatory process. 3. Stable osseous metastatic disease. 4. Left Bosniak I benign renal cyst measuring 5.6 cm. No follow-up imaging is recommended. JACR 2018 Feb; 264-273, Management of the Incidental Renal Mass on CT, [employer] 2021; [id number], Bosniak Classification of Cystic Renal Masses, Version 2019. Aortic Atherosclerosis (ICD10-I70.0). Electronically Signed   By: Darliss Cheney M.D.   On: 07/24/2022 01:23   CT Angio Chest Pulmonary Embolism (PE) W or WO Contrast  Result Date: 07/24/2022 CLINICAL DATA:  Pulmonary embolism (PE) suspected, low to intermediate prob, neg D-dimer. Metastatic breast cancer. History of pulmonary embolus.  Generalized weakness EXAM: CT ANGIOGRAPHY CHEST WITH CONTRAST TECHNIQUE: Multidetector CT imaging of the chest was performed using the standard protocol during bolus administration of intravenous contrast. Multiplanar CT image reconstructions and MIPs were obtained to evaluate the vascular anatomy. RADIATION DOSE REDUCTION:  This exam was performed according to the departmental dose-optimization program which includes automated exposure control, adjustment of the mA and/or kV according to patient size and/or use of iterative reconstruction technique. CONTRAST:  60mL OMNIPAQUE IOHEXOL 350 MG/ML SOLN COMPARISON:  PET CT 03/29/2022 FINDINGS: Cardiovascular: Heart is normal size. Aorta is normal caliber. Scattered aortic calcifications. No filling defects in the pulmonary arteries to suggest pulmonary emboli. Mediastinum/Nodes: No mediastinal, hilar, or axillary adenopathy. Trachea and esophagus are unremarkable. Thyroid unremarkable. Lungs/Pleura: Mild centrilobular emphysema. Dependent opacities in the lungs compatible with dependent atelectasis. Calcified granuloma in the right lower lobe. Airspace opacity noted in the posterior right upper lobe. Cannot exclude pneumonia. No effusions. Upper Abdomen: No acute findings Musculoskeletal: Prior left mastectomy. Chest wall soft tissues are unremarkable. Bilateral scapular osseous lesions compatible with metastases. Review of the MIP images confirms the above findings. IMPRESSION: No evidence of pulmonary embolus. Dependent atelectasis. Airspace opacity noted in the posterior right upper lobe concerning for pneumonia. Osseous metastatic disease involving the scapulae. Aortic Atherosclerosis (ICD10-I70.0) and Emphysema (ICD10-J43.9). Electronically Signed   By: Charlett Nose M.D.   On: 07/24/2022 01:02   DG Chest Port 1 View  Result Date: 07/23/2022 CLINICAL DATA:  Sepsis. EXAM: PORTABLE CHEST 1 VIEW COMPARISON:  Chest radiograph dated 07/18/2022. FINDINGS: No focal consolidation, pleural effusion, pneumothorax. Mild diffuse chronic intra coarsening. The cardiac silhouette is within normal limits. No acute osseous pathology. IMPRESSION: No active disease. Electronically Signed   By: Elgie Collard M.D.   On: 07/23/2022 20:42   DG Chest Port 1 View  Result Date: 07/18/2022 CLINICAL DATA:   Shortness of breath.  History of breast cancer. EXAM: PORTABLE CHEST 1 VIEW COMPARISON:  X-ray 10/27/2019.  PET-CT scan 03/29/2022 FINDINGS: Overlapping cardiac leads. Normal cardiopericardial silhouette. No consolidation, pneumothorax or effusion. There is some linear opacity left lung base likely scar or atelectasis. Older rib fractures are identified. The subtle left upper lobe opacity corresponds to the finding by CT scan of some pleural thickening and interstitial septal thickening. Previous left mastectomy. IMPRESSION: No acute cardiopulmonary disease. Chronic changes. Mild left basilar scar or atelectasis. Electronically Signed   By: Karen Kays M.D.   On: 07/18/2022 17:24    PERFORMANCE STATUS (ECOG) : 1 - Symptomatic but completely ambulatory  Review of Systems Unless otherwise noted, a complete review of systems is negative.  Physical Exam General: NAD Cardiovascular: regular rate and rhythm Pulmonary: clear ant fields Abdomen: soft, nontender, + bowel sounds GU: no suprapubic tenderness Extremities: no edema, no joint deformities Skin: no rashes Neurological: Weakness but otherwise nonfocal  IMPRESSION: Patient was code sepsis called overnight and was restarted on IV antibiotics.  Today, patient noted to have some transient confusion.  She says that she is feeling slightly better but feels tired after "running all over the hospital last night."  Patient's goals seem aligned with current scope of treatment.  She says that she has some confusion about the nature of her cancer treatment and we discussed the reason why [name] rotated her to next line treatment given evidence of progression on last PET scan.  Although patient has stage IV breast cancer, she has multiple other options for treatment available in the event of further progression.  Patient says  she was living at home alone prior to this hospitalization.  She has a son who lives nearby who she sees weekly.  Patient  says she is mostly independent with her own care but she admits that at times she feels like she needs more help at home.  Note plan for rehab and patient is in agreement with this.  She would benefit from palliative care following in that setting.  I tried calling patient's son but was unsuccessful at reaching him.  PLAN: -Continue current scope of treatment -Dispo: Probable rehab with palliative care following -Patient will benefit from additional goals of care conversations in the outpatient setting   Time Total: 45 minutes  Visit consisted of counseling and education dealing with the complex and emotionally intense issues of symptom management and palliative care in the setting of serious and potentially life-threatening illness.Greater than 50%  of this time was spent counseling and coordinating care related to the above assessment and plan.  Signed by: Laurette Schimke, PhD, NP-C

## 2022-07-24 NOTE — TOC Progression Note (Signed)
Transition of Care [facility]) - Progression Note    Patient Details  Name: Alicia [name] MRN: [id number] Date of Birth: 1950/08/18  Transition of Care [facility]) CM/SW Contact  Garret Reddish, RN Phone Number: 07/24/2022, 5:04 PM  Clinical Narrative:     Chart reviewed.  Noted that patient had elevated temperature last night.  Patient has been restarted on IV antibiotics for Sepsis and Pneumonia.  Patient has been restarted on IV cefepime and vancomycin.   TOC will continue to follow for discharge planning.         Expected Discharge Plan and Services                                               Social Determinants of Health (SDOH) Interventions SDOH Screenings   Food Insecurity: No Food Insecurity (07/18/2022)  Housing: Low Risk  (07/18/2022)  Transportation Needs: No Transportation Needs (07/18/2022)  Utilities: Not At Risk (07/18/2022)  Alcohol Screen: Low Risk  (03/09/2022)  Depression (PHQ2-9): Low Risk  (03/09/2022)  Financial Resource Strain: High Risk (03/09/2022)  Physical Activity: Insufficiently Active (03/09/2022)  Social Connections: Socially Isolated (03/09/2022)  Stress: No Stress Concern Present (03/09/2022)  Tobacco Use: Medium Risk (07/18/2022)    Readmission Risk Interventions     No data to display

## 2022-07-24 NOTE — Progress Notes (Signed)
Nutrition Follow-up  DOCUMENTATION CODES:   Non-severe (moderate) malnutrition in context of chronic illness  INTERVENTION:    -Continue Ensure Enlive po TID, each supplement provides 350 kcal and 20 grams of protein. -Continue Magic cup TID with meals, each supplement provides 290 kcal and 9 grams of protein -Continue MVI po daily  -30 ml Prosource Plus TID, each supplement provides 100 kcals and 15 grams protein -500 mg vitamin C BID -220 mg zinc sulfate daily x 14 days  NUTRITION DIAGNOSIS:   Moderate Malnutrition related to chronic illness as evidenced by mild fat depletion, mild muscle depletion, percent weight loss.  Ongoing  GOAL:   Patient will meet greater than or equal to 90% of their needs  [place]   MONITOR:   PO intake, Supplement acceptance, Labs, Weight trends, I & O's, Skin  REASON FOR ASSESSMENT:   Consult Assessment of nutrition requirement/status  ASSESSMENT:   72 y/o female with h/o Stage IV recurrent invasive carcinoma of the breast with bony mets s/p mastectomy/XRT and on chemo, PE, hypertension, aortic atherosclerosis, OSA, anxiety, GERD and thyroid disease who is admitted with AKI, weakness and AMS.  7/9- s/p BSE- dysphagia 3 diet with thin liquids   Reviewed I/O's: -460 ml x 24 hours and +1.2 L since amdmission  UOP: 700 ml x 24 hours   Pt lying in bed at time of visit. Pt not very interactive with this RD. She responded mostly to close ended questions. She complains of being cold.   Noted lunch tray on tray table. Pt consumed a bite of mashed potatoes and a few sips of Ensure. Pt reports not feeling very hungry and is not sure what she wants. Pt says that the Ensure is "ok", but unsure if she really likes it. Noted meal completions 0-50%.   Per TOC notes, plan to discharge to SNF once medically stable.   Wt has been stable since admission.   Medications reviewed and include folic acid, potassium chloride, and thiamine.   Labs  reviewed: CBGS: 104 (inpatient orders for glycemic control are none).    Diet Order:   Diet Order             DIET DYS 3 Room service appropriate? Yes with Assist; Fluid consistency: Thin  Diet effective now                   EDUCATION NEEDS:   Education needs have been addressed  Skin:  Skin Assessment: Skin Integrity Issues: Skin Integrity Issues:: Stage II Stage II: coccyx  Last BM:  07/23/22  Height:   Ht Readings from Last 1 Encounters:  07/18/22 5\' 6"  (1.676 m)    Weight:   Wt Readings from Last 1 Encounters:  07/24/22 88.1 kg    Ideal Body Weight:  59.1 kg  BMI:  Body mass index is 31.35 kg/m.  Estimated Nutritional Needs:   Kcal:  1750-1950  Protein:  90-105 grams  Fluid:  > 1.8 L    Levada Schilling, RD, LDN, CDCES Registered Dietitian II Certified Diabetes Care and Education Specialist Please refer to [facility] for RD and/or RD on-call/weekend/after hours pager

## 2022-07-24 NOTE — Evaluation (Addendum)
Clinical/Bedside Swallow Evaluation Patient Details  Name: Alicia [name] MRN: [id number] Date of Birth: [birth year]  Today's Date: 07/24/2022 Time: SLP Start Time (ACUTE ONLY): 0830 SLP Stop Time (ACUTE ONLY): 0930 SLP Time Calculation (min) (ACUTE ONLY): 60 min  Past Medical History:  Past Medical History:  Diagnosis Date   Anxiety    Breast cancer, left (HCC) 07/2017   Depression    Hypertension    Hypothyroidism    Personal history of chemotherapy 2019   LEFT mastectomy-chemo before   Personal history of radiation therapy 03/2018   LEFT mastectomy   Rapid heart rate    Thyroid disease    Past Surgical History:  Past Surgical History:  Procedure Laterality Date   AXILLARY LYMPH NODE BIOPSY Left 07/16/2017   METASTATIC MAMMARY CARCINOMA   BREAST BIOPSY Left 07/16/2017   [place] bx of left breast mass and left breast LN.  INVASIVE MAMMARY CARCINOMA, NO SPECIAL TYPE.    BREAST EXCISIONAL BIOPSY Right 2001   benign   BREAST LUMPECTOMY WITH SENTINEL LYMPH NODE BIOPSY Left 12/06/2017   Procedure: BREAST LUMPECTOMY WITH SENTINEL LYMPH NODE BX;  Surgeon: Earline Mayotte, MD;  Location: [facility] ORS;  Service: General;  Laterality: Left;   COLONOSCOPY     MASTECTOMY Left 12/23/2017   PORTACATH PLACEMENT Right 08/07/2017   Procedure: INSERTION PORT-A-CATH;  Surgeon: Earline Mayotte, MD;  Location: [facility] ORS;  Service: General;  Laterality: Right;   SIMPLE MASTECTOMY WITH AXILLARY SENTINEL NODE BIOPSY Left 12/23/2017   T2,N2 with 6/7 nodes positive. Whole breast radiation.  Surgeon: Earline Mayotte, MD;  Location: [facility] ORS;  Service: General;  Laterality: Left;   TONSILLECTOMY     HPI:  Pt is a 72 y.o. 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     History obtained from patient and her son.  Mrs.  [name] states that for the last couple days, she has been feeling confused and disoriented, describing it as not understanding what is going on around her.  She endorses chronic diarrhea that has been persistent but denies any melena or hematochezia.  She endorses poor p.o. intake but denies any nausea, vomiting.  Her son at bedside states that on 6/30, he checked on her and she seemed at her baseline.  Since then, she has been rapidly declining.  She has been so weak that she has been unable to get up and use the restroom on her own.  No focal weakness.   Patient's son feels that patient symptoms have gotten worse since she was taken off her bone injection.  He notes increased pain.  Per chart review, Alicia [name] was discontinued by Oncology due to mandible concerns.  Imagng:  centrilobular emphysema. Dependent opacities in  the lungs compatible with dependent atelectasis. Calcified granuloma  in the right lower lobe. Airspace opacity noted in the posterior right upper lobe. Osseous metastatic disease involving the scapulae.    Assessment / Plan / Recommendation  Clinical Impression   Pt seen today for BSE. Pt awake, confusion+ -- "I haven't had water in 4 days". Pt engaged w/ this SLP but needed min-mod cues for follow through. 1-step instructions best for pt. Noted extensive Cancer dx. Unsure of medical illness/Cancer impact on Cognition at this point. Recommend f/u w/ Neurology for such if indicated. On RA this morning(using O2 at night  per NSG); afebrile this morning; WBC low.    Pt appears to present w/ functional oropharyngeal phase swallowing w/ No overt oropharyngeal phase dysphagia noted, No overt neuromuscular deficits noted. Pt consumed po trials w/ No immediate, overt, clinical s/s of aspiration during them.  Pt appears at reduced risk for aspiration when following general aspiration precautions and w/ a minimally modified diet for ease of mastication/attention w/ solid foods.  However, pt does  have challenging factors that could impact her oropharyngeal swallowing to include Chronic, lengthy illness; Cancer dxs, discomfort(NSG aware), deconditioning/weakness, and Confusion which impacts her awareness and follow through w/ tasks -- she required MOD support during po trials this morning. These factors can increase risk for aspiration, dysphagia as well as decreased oral intake overall.   During po trials, pt consumed all consistencies w/ no overt coughing, decline in vocal quality, or change in respiratory presentation during/post trials. O2 sats 97% when checked. Oral phase appeared grossly [facility] w/ timely bolus management, mastication, and control of bolus propulsion for A-P transfer for swallowing. Oral clearing achieved w/ all trial consistencies -- moistened, soft foods given.  OM Exam appeared [facility] w/ no unilateral weakness noted. Speech Clear. Pt fed self w/ MOD setup, support.   Recommend a more Mech Soft consistency diet for well-Cut meats, moistened foods; Thin liquids -- encourage pt to help to Hold Cup when drinking for safer drinking of liquids. Recommend general aspiration precautions, reduce distractions during meals, support w/ feeding and tray setup/sitting up at meals -- chair best. Pills WHOLE vs CRUSHED in Puree for safer, easier swallowing.  Education given on Pills in Puree; food consistencies and easy to eat options; general aspiration precautions to pt and NSG. MD updated. NSG to reconsult if any new needs arise. NSG updated, agreed. MD agreed. Recommend Dietician and Palliative Care f/u for support. SLP Visit Diagnosis: Dysphagia, unspecified (R13.10)    Aspiration Risk   (reduced followinhg general aspiration precautions)    Diet Recommendation   Thin;Dysphagia 3 (mechanical soft) (moist/cooked, easy to eat foods) = a more Mech Soft consistency diet for well-Cut meats, moistened foods; Thin liquids -- encourage pt to help to Hold Cup when drinking for safer drinking of  liquids. Recommend general aspiration precautions, reduce distractions during meals, support w/ feeding and tray setup/sitting up at meals -- chair best.   Medication Administration: Whole meds with puree (vs need to Crush in puree)    Other  Recommendations Recommended Consults:  (Dietician f/u) Oral Care Recommendations: Oral care BID;Oral care before and after PO;Staff/trained caregiver to provide oral care    Recommendations for follow up therapy are one component of a multi-disciplinary discharge planning process, led by the attending physician.  Recommendations may be updated based on patient status, additional functional criteria and insurance authorization.  Follow up Recommendations No SLP follow up      Assistance Recommended at Discharge  Full d/t weakness and confusion  Functional Status Assessment Patient has had a recent decline in their functional status and demonstrates the ability to make significant improvements in function in a reasonable and predictable amount of time.  Frequency and Duration  (n/a)   (n/a)       Prognosis Prognosis for improved oropharyngeal function: Good Barriers to Reach Goals: Cognitive deficits;Time post onset;Severity of deficits;Behavior Barriers/Prognosis Comment: advanced Cancer      Swallow Study   General Date of Onset: 07/18/22 HPI: Pt is a 72 y.o. female with medical history significant of Stage IV recurrent invasive carcinoma  of the breast with bony mets, previous PE, hypertension, aortic atherosclerosis, OSA, who presents to the ED due to altered mental status and weakness.     History obtained from patient and her son.  Alicia [name] states that for the last couple days, she has been feeling confused and disoriented, describing it as not understanding what is going on around her.  She endorses chronic diarrhea that has been persistent but denies any melena or hematochezia.  She endorses poor p.o. intake but denies any nausea,  vomiting.  Her son at bedside states that on 6/30, he checked on her and she seemed at her baseline.  Since then, she has been rapidly declining.  She has been so weak that she has been unable to get up and use the restroom on her own.  No focal weakness.   Patient's son feels that patient symptoms have gotten worse since she was taken off her bone injection.  He notes increased pain.  Per chart review, Alicia [name] was discontinued by Oncology due to mandible concerns.  Imagng:  centrilobular emphysema. Dependent opacities in  the lungs compatible with dependent atelectasis. Calcified granuloma  in the right lower lobe. Airspace opacity noted in the posterior right upper lobe. Osseous metastatic disease involving the scapulae. Type of Study: Bedside Swallow Evaluation Previous Swallow Assessment: none Diet Prior to this Study: NPO (regular diet at home) Temperature Spikes Noted: No (wbc 3.8) Respiratory Status: Room air (this am) History of Recent Intubation: No Behavior/Cognition: Alert;Cooperative;Pleasant mood;Confused;Distractible;Requires cueing Oral Cavity Assessment: Dry (sticky) Oral Care Completed by SLP: Yes Oral Cavity - Dentition: Adequate natural dentition;Missing dentition (few) Vision: Functional for self-feeding Self-Feeding Abilities: Able to feed self;Needs assist;Needs set up;Total assist Patient Positioning: Upright in bed (needed support w/ setup and feeding) Baseline Vocal Quality: Normal Volitional Cough: Strong;Congested (min) Volitional Swallow: Able to elicit    Oral/Motor/Sensory Function Overall Oral Motor/Sensory Function: Within functional limits   Ice Chips Ice chips: Within functional limits Presentation: Spoon (fed; 3 trials)   Thin Liquid Thin Liquid: Within functional limits Presentation: Cup;Self Fed (~12 ozs total) Other Comments: water, juice    Nectar Thick Nectar Thick Liquid: Not tested   Honey Thick Honey Thick Liquid: Not tested   Puree Puree: Within  functional limits Presentation: Spoon (dws; 8 trials)   Solid     Solid: Within functional limits (grossly) Presentation: Self Fed (supported; 8 trials)        Jerilynn Som, MS, CCC-SLP Speech Language Pathologist Rehab Services; [facility] [place] [phone] (ascom) [place] 07/24/2022,12:29 PM

## 2022-07-24 NOTE — Progress Notes (Signed)
Physical Therapy Treatment Patient Details Name: Alicia [name] MRN: [id number] DOB: 1950-07-08 Today's Date: 07/24/2022   History of Present Illness Alicia [name] is a 72 y.o. 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  Family said she had not taken any of her medicines for about a week prior to admission.    PT Comments  Patient agreeable to PT. She needs increased time for processing with maximal multi modal cues required with functional mobility today. The patient required maximal assistance for bed mobility. Moderate assistance required for stand step transfer performed to and from bed side commode using rolling walker. Difficulty with sequencing how to complete the transfer even with step by step cues. Activity tolerance is limited by fatigue. Unable to safely progress ambulation today with limited standing tolerance. Recommend to continue PT to maximize independence and facilitate return to prior level of function.      Assistance Recommended at Discharge Frequent or constant Supervision/Assistance  If plan is discharge home, recommend the following:  Can travel by private vehicle    A lot of help with walking and/or transfers;A lot of help with bathing/dressing/bathroom;Assistance with cooking/housework;Assistance with feeding;Help with stairs or ramp for entrance;Assist for transportation   No  Equipment Recommendations  None recommended by PT    Recommendations for Other Services       Precautions / Restrictions Precautions Precautions: Fall Restrictions Weight Bearing Restrictions: No     Mobility  Bed Mobility Overal bed mobility: Needs Assistance Bed Mobility: Supine to Sit, Sit to Supine     Supine to sit: Max assist Sit to supine: Max  assist   General bed mobility comments: assistance for BLE and trunk support. maximal cues for task initiation and sequencing. increased time and effort required with all mobility    Transfers Overall transfer level: Needs assistance Equipment used: Rolling walker (2 wheels) Transfers: Bed to chair/wheelchair/BSC     Step pivot transfers: Mod assist       General transfer comment: assisted patient with step pivot transfer to and from bed side commode from the bed. maximal cues required for technique, task initiation, hand placement, sequencing with taking steps    Ambulation/Gait               General Gait Details: unable to safely progress ambulation due to poor standing tolerance and generalized weakness   Stairs             Wheelchair Mobility     Tilt Bed    Modified Rankin (Stroke Patients Only)       Balance Overall balance assessment: Needs assistance Sitting-balance support: Feet supported Sitting balance-Leahy Scale: Fair     Standing balance support: Bilateral upper extremity supported, Reliant on assistive device for balance Standing balance-Leahy Scale: Poor Standing balance comment: external support is required to maintain standing balance with standing tolerance of 15 seconds. standing activity tolerance is limited by fatigue                            Cognition Arousal/Alertness: Awake/alert Behavior During Therapy: Flat affect Overall Cognitive Status: Impaired/Different from baseline Area of Impairment: Memory, Orientation, Following commands, Problem solving                 Orientation Level: Disoriented  to, Time Current Attention Level: Focused Memory: Decreased short-term memory, Decreased recall of precautions Following Commands: Follows one step commands inconsistently (increased time, repetition required) Safety/Judgement: Decreased awareness of safety, Decreased awareness of deficits   Problem Solving:  Slow processing, Difficulty sequencing, Decreased initiation, Requires verbal cues, Requires tactile cues          Exercises      General Comments General comments (skin integrity, edema, etc.): patient declined getting in the chair at this time.      Pertinent Vitals/Pain Pain Assessment Pain Assessment: No/denies pain    Home Living                          Prior Function            PT Goals (current goals can now be found in the care plan section) Acute Rehab PT Goals Patient Stated Goal: none stated PT Goal Formulation: With patient Time For Goal Achievement: 08/01/22 Potential to Achieve Goals: Fair Progress towards PT goals: Progressing toward goals    Frequency    Min 1X/week      PT Plan Current plan remains appropriate    Co-evaluation              AM-PAC PT "6 Clicks" Mobility   Outcome Measure  Help needed turning from your back to your side while in a flat bed without using bedrails?: A Little Help needed moving from lying on your back to sitting on the side of a flat bed without using bedrails?: A Little Help needed moving to and from a bed to a chair (including a wheelchair)?: A Lot Help needed standing up from a chair using your arms (e.g., wheelchair or bedside chair)?: A Lot Help needed to walk in hospital room?: A Lot Help needed climbing 3-5 steps with a railing? : Total 6 Click Score: 13    End of Session   Activity Tolerance: Patient limited by fatigue Patient left: in bed;with call bell/phone within reach;with bed alarm set Nurse Communication: Mobility status PT Visit Diagnosis: Muscle weakness (generalized) (M62.81);Difficulty in walking, not elsewhere classified (R26.2)     Time: 1136-1203 PT Time Calculation (min) (ACUTE ONLY): 27 min  Charges:    $Therapeutic Activity: 23-37 mins PT General Charges $$ ACUTE PT VISIT: 1 Visit                     Donna Bernard, PT, MPT    Ina Homes 07/24/2022,  1:42 PM

## 2022-07-25 DIAGNOSIS — R4182 Altered mental status, unspecified: Secondary | ICD-10-CM | POA: Diagnosis not present

## 2022-07-25 DIAGNOSIS — J189 Pneumonia, unspecified organism: Secondary | ICD-10-CM | POA: Diagnosis not present

## 2022-07-25 LAB — CBC WITH DIFFERENTIAL/PLATELET
Abs Immature Granulocytes: 0.19 10*3/uL — ABNORMAL HIGH (ref 0.00–0.07)
Basophils Absolute: 0 10*3/uL (ref 0.0–0.1)
Basophils Relative: 1 %
Eosinophils Absolute: 0.1 10*3/uL (ref 0.0–0.5)
Eosinophils Relative: 3 %
HCT: 28.4 % — ABNORMAL LOW (ref 36.0–46.0)
Hemoglobin: 9.1 g/dL — ABNORMAL LOW (ref 12.0–15.0)
Immature Granulocytes: 5 %
Lymphocytes Relative: 5 %
Lymphs Abs: 0.2 10*3/uL — ABNORMAL LOW (ref 0.7–4.0)
MCH: 26.5 pg (ref 26.0–34.0)
MCHC: 32 g/dL (ref 30.0–36.0)
MCV: 82.6 fL (ref 80.0–100.0)
Monocytes Absolute: 0.6 10*3/uL (ref 0.1–1.0)
Monocytes Relative: 14 %
Neutro Abs: 3.1 10*3/uL (ref 1.7–7.7)
Neutrophils Relative %: 72 %
Platelets: 155 10*3/uL (ref 150–400)
RBC: 3.44 MIL/uL — ABNORMAL LOW (ref 3.87–5.11)
RDW: 16.9 % — ABNORMAL HIGH (ref 11.5–15.5)
WBC: 4.2 10*3/uL (ref 4.0–10.5)
nRBC: 0 % (ref 0.0–0.2)

## 2022-07-25 LAB — BASIC METABOLIC PANEL
Anion gap: 12 (ref 5–15)
BUN: 13 mg/dL (ref 8–23)
CO2: 23 mmol/L (ref 22–32)
Calcium: 8.6 mg/dL — ABNORMAL LOW (ref 8.9–10.3)
Chloride: 103 mmol/L (ref 98–111)
Creatinine, Ser: 1.27 mg/dL — ABNORMAL HIGH (ref 0.44–1.00)
GFR, Estimated: 45 mL/min — ABNORMAL LOW (ref >60)
Glucose, Bld: 101 mg/dL — ABNORMAL HIGH (ref 70–99)
Potassium: 2.9 mmol/L — ABNORMAL LOW (ref 3.5–5.1)
Sodium: 138 mmol/L (ref 135–145)

## 2022-07-25 LAB — PROCALCITONIN: Procalcitonin: 0.65 ng/mL

## 2022-07-25 LAB — MRSA NEXT GEN BY PCR, NASAL: MRSA by PCR Next Gen: NOT DETECTED

## 2022-07-25 NOTE — Progress Notes (Signed)
PROGRESS NOTE    Alicia [name] [facility]  WUJ:[id number] DOB: 08-21-1950 DOA: 07/18/2022 PCP: Corky Downs, MD    Assessment & Plan:   Principal Problem:   Altered mental status Active Problems:   AKI (acute kidney injury) (HCC)   Electrolyte abnormality   Generalized weakness   QT prolongation   Elevated troponin   Diarrhea   Malignant neoplasm metastatic to bone (HCC)   Primary hypertension   Sepsis (HCC)   Hypokalemia   Hypophosphatemia   Acute metabolic encephalopathy   Malnutrition of moderate degree   Acute UTI   Pressure injury of skin   Pneumonia   Palliative care encounter  Assessment and Plan: Acute metabolic encephalopathy: labile. Possibly secondary to infection. Continue on IV abxs    Sepsis: see [name] notes on how pt mets sepsis criteria. Secondary to right upper lobe pneumonia. Continue IV cefepime. D/c IV vanco    Acute UTI: Urine culture showed Klebsiella oxytoca.  Initial plan was to complete cefdinir on 08/04/2022.  However, she has been restarted on IV antibiotics for sepsis and pneumonia.     AKI on CKDIIIb: Cr is trending down from day prior. Avoid nephrotoxic meds   Hypokalemia: potassium given    Hypophosphatemia: WNL    Prolonged Qtc: continue on tele  Elevated troponins: likely due to demand ischemia.   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  Outpatient follow-up with [name], oncologist. No chemo will be given at SNF    Stage II coccygeal decubitus ulcer: present on admission. Continue w/ wound care      DVT prophylaxis: lovenox  Code Status: full Family Communication: Disposition Plan: PT/OT recs SNF  Level of care: Telemetry Medical Status is: Inpatient Remains inpatient appropriate because: severity of illness    Consultants:    Procedures:   Antimicrobials: cefepime    Subjective: Pt c/o fatigue   Objective: Vitals:   07/24/22 0801 07/24/22 1749 07/24/22  1942 07/25/22 0620  BP: (!) 118/51 117/63 120/72 131/66  Pulse: (!) 106 (!) 105  (!) 103  Resp: 20 16 20 18   Temp: 99.4 F (37.4 C) 98 F (36.7 C) 99.9 F (37.7 C) 99.8 F (37.7 C)  TempSrc: Oral  Oral Oral  SpO2: 90% 95% 94% (!) 89%  Weight:      Height:        Intake/Output Summary (Last 24 hours) at 07/25/2022 0742 Last data filed at 07/25/2022 9562 Gross per 24 hour  Intake 984.37 ml  Output 1400 ml  Net -415.63 ml   Filed Weights   07/21/22 0500 07/22/22 0108 07/24/22 0500  Weight: 92.6 kg 90.3 kg 88.1 kg    Examination:  General exam: Appears calm and comfortable  Respiratory system: diminished breath sounds b/l  Cardiovascular system: S1 & S2+. No rubs, gallops or clicks.  Gastrointestinal system: Abdomen is obese, soft and nontender.Normal bowel sounds heard. Central nervous system: Alert and awake. Moves all extremities Psychiatry: Judgement and insight appears poor. Flat mood and affect    Data Reviewed: I have personally reviewed following labs and imaging studies  CBC: Recent Labs  Lab 07/18/22 1652 07/19/22 0657 07/23/22 2228 07/25/22 0521  WBC 6.4 5.3 3.8* 4.2  NEUTROABS 5.5  --  3.0 3.1  HGB 11.2* 10.9* 8.5* 9.1*  HCT 33.6* 32.5* 26.4* 28.4*  MCV 81.4 81.5 82.5 82.6  PLT 215 179 146* 155   Basic Metabolic Panel: Recent Labs  Lab 07/18/22 1859  07/19/22 9562 07/20/22 0416 07/21/22 0630 07/22/22 0841 07/23/22 0633 07/23/22 2228 07/25/22 0521  NA  --  131* 136 140 137 137 135 138  K  --  2.5* 2.8* 3.4* 3.1* 3.4* 3.7 2.9*  CL  --  92* 101 103 100 104 103 103  CO2  --  26 26 26 25 25 24 23   GLUCOSE  --  113* 109* 107* 111* 104* 115* 101*  BUN  --  16 13 12 15 14 16 13   CREATININE  --  1.80* 1.63* 1.56* 1.53* 1.54* 1.52* 1.27*  CALCIUM  --  8.5* 8.0* 8.5* 8.0* 8.3* 8.3* 8.6*  MG 1.8 2.1 1.8 1.7  --  2.0  --   --   PHOS  --  1.3* 2.8 1.9* 2.6  --   --   --    GFR: Estimated Creatinine Clearance: 44.1 mL/min (A) (by C-G formula based on  SCr of 1.27 mg/dL (H)). Liver Function Tests: Recent Labs  Lab 07/18/22 1652 07/22/22 0841 07/23/22 2228  AST 85*  --  295*  ALT 44  --  121*  ALKPHOS 118  --  117  BILITOT 1.3*  --  0.4  PROT 7.3  --  5.4*  ALBUMIN 3.6 2.7* 2.3*   No results for input(s): "LIPASE", "AMYLASE" in the last 168 hours. No results for input(s): "AMMONIA" in the last 168 hours. Coagulation Profile: Recent Labs  Lab 07/18/22 1708 07/23/22 2118 07/23/22 2228  INR 1.2 1.8* 1.3*   Cardiac Enzymes: No results for input(s): "CKTOTAL", "CKMB", "CKMBINDEX", "TROPONINI" in the last 168 hours. BNP (last 3 results) No results for input(s): "PROBNP" in the last 8760 hours. HbA1C: No results for input(s): "HGBA1C" in the last 72 hours. CBG: Recent Labs  Lab 07/19/22 0725  GLUCAP 104*   Lipid Profile: No results for input(s): "CHOL", "HDL", "LDLCALC", "TRIG", "CHOLHDL", "LDLDIRECT" in the last 72 hours. Thyroid Function Tests: No results for input(s): "TSH", "T4TOTAL", "FREET4", "T3FREE", "THYROIDAB" in the last 72 hours. Anemia Panel: No results for input(s): "VITAMINB12", "FOLATE", "FERRITIN", "TIBC", "IRON", "RETICCTPCT" in the last 72 hours. Sepsis Labs: Recent Labs  Lab 07/18/22 1652 07/18/22 1859 07/23/22 2223 07/23/22 2228 07/24/22 0007  PROCALCITON  --   --   --  0.54  --   LATICACIDVEN 2.7* 1.5 1.5  --  1.4    Recent Results (from the past 240 hour(s))  Culture, blood (Routine x 2)     Status: None   Collection Time: 07/18/22  4:52 PM   Specimen: BLOOD  Result Value Ref Range Status   Specimen Description BLOOD RIGHT ANTECUBITAL  Final   Special Requests   Final    BOTTLES DRAWN AEROBIC AND ANAEROBIC Blood Culture results may not be optimal due to an excessive volume of blood received in culture bottles   Culture   Final    NO GROWTH 5 DAYS Performed at [facility], [address], [place], [place] [address]    Report Status 07/23/2022 FINAL  Final  Culture, blood  (Routine x 2)     Status: None   Collection Time: 07/18/22  5:08 PM   Specimen: BLOOD  Result Value Ref Range Status   Specimen Description BLOOD BLOOD RIGHT ARM  Final   Special Requests   Final    BOTTLES DRAWN AEROBIC AND ANAEROBIC Blood Culture adequate volume   Culture   Final    NO GROWTH 5 DAYS Performed at [facility], [address]  [place], [place] [address]    Report Status 07/23/2022 FINAL  Final  Urine Culture (for pregnant, neutropenic or urologic patients or patients with an indwelling urinary catheter)     Status: Abnormal   Collection Time: 07/18/22  8:30 PM   Specimen: Urine, Clean Catch  Result Value Ref Range Status   Specimen Description   Final    URINE, CLEAN CATCH Performed at [facility], [address] [place], [place] [address]    Special Requests   Final    NONE Performed at [facility], [address], [place], [place] [address]    Culture >=100,000 COLONIES/mL KLEBSIELLA OXYTOCA (A)  Final   Report Status 07/21/2022 FINAL  Final   Organism ID, Bacteria KLEBSIELLA OXYTOCA (A)  Final      Susceptibility   Klebsiella oxytoca - MIC*    AMPICILLIN >=32 RESISTANT Resistant     CEFEPIME <=0.12 SENSITIVE Sensitive     CEFTRIAXONE <=0.25 SENSITIVE Sensitive     CIPROFLOXACIN <=0.25 SENSITIVE Sensitive     GENTAMICIN <=1 SENSITIVE Sensitive     IMIPENEM <=0.25 SENSITIVE Sensitive     NITROFURANTOIN <=16 SENSITIVE Sensitive     TRIMETH/SULFA <=20 SENSITIVE Sensitive     AMPICILLIN/SULBACTAM 16 INTERMEDIATE Intermediate     PIP/TAZO <=4 SENSITIVE Sensitive     * >=100,000 COLONIES/mL KLEBSIELLA OXYTOCA  Respiratory (~20 pathogens) panel by PCR     Status: None   Collection Time: 07/23/22  9:10 PM   Specimen: Nasopharyngeal Swab; Respiratory  Result Value Ref Range Status   Adenovirus NOT DETECTED NOT DETECTED Final   Coronavirus 229E NOT DETECTED NOT DETECTED Final    Comment: (NOTE) The Coronavirus on the Respiratory  Panel, DOES NOT test for the novel  Coronavirus (2019 nCoV)    Coronavirus HKU1 NOT DETECTED NOT DETECTED Final   Coronavirus NL63 NOT DETECTED NOT DETECTED Final   Coronavirus OC43 NOT DETECTED NOT DETECTED Final   Metapneumovirus NOT DETECTED NOT DETECTED Final   Rhinovirus / Enterovirus NOT DETECTED NOT DETECTED Final   Influenza A NOT DETECTED NOT DETECTED Final   Influenza B NOT DETECTED NOT DETECTED Final   Parainfluenza Virus 1 NOT DETECTED NOT DETECTED Final   Parainfluenza Virus 2 NOT DETECTED NOT DETECTED Final   Parainfluenza Virus 3 NOT DETECTED NOT DETECTED Final   Parainfluenza Virus 4 NOT DETECTED NOT DETECTED Final   Respiratory Syncytial Virus NOT DETECTED NOT DETECTED Final   Bordetella pertussis NOT DETECTED NOT DETECTED Final   Bordetella Parapertussis NOT DETECTED NOT DETECTED Final   Chlamydophila pneumoniae NOT DETECTED NOT DETECTED Final   Mycoplasma pneumoniae NOT DETECTED NOT DETECTED Final    Comment: Performed at [facility], [address], [place], [place] [address]  Culture, blood (x 2)     Status: None (Preliminary result)   Collection Time: 07/23/22  9:18 PM   Specimen: BLOOD  Result Value Ref Range Status   Specimen Description BLOOD RIGHT ANTECUBITAL  Final   Special Requests   Final    BOTTLES DRAWN AEROBIC AND ANAEROBIC Blood Culture adequate volume   Culture   Final    NO GROWTH < 12 HOURS Performed at [facility], [address] [place], [place] [address]    Report Status PENDING  Incomplete  Culture, blood (x 2)     Status: None (Preliminary result)   Collection Time: 07/23/22  9:18 PM   Specimen: BLOOD  Result Value Ref Range Status   Specimen Description BLOOD  BLOOD RIGHT HAND  Final   Special Requests   Final    BOTTLES DRAWN AEROBIC AND ANAEROBIC Blood Culture adequate volume   Culture   Final    NO GROWTH < 12 HOURS Performed at [facility], [address], [place], [place] [address]    Report Status  PENDING  Incomplete         Radiology Studies: CT ABDOMEN PELVIS W CONTRAST  Result Date: 07/24/2022 CLINICAL DATA:  Sepsis. Stage IV carcinoma of the breast with bony Mets. EXAM: CT ABDOMEN AND PELVIS WITH CONTRAST TECHNIQUE: Multidetector CT imaging of the abdomen and pelvis was performed using the standard protocol following bolus administration of intravenous contrast. RADIATION DOSE REDUCTION: This exam was performed according to the departmental dose-optimization program which includes automated exposure control, adjustment of the mA and/or kV according to patient size and/or use of iterative reconstruction technique. CONTRAST:  60mL OMNIPAQUE IOHEXOL 350 MG/ML SOLN COMPARISON:  PET-CT 03/29/2022. FINDINGS: Lower chest: There is dependent atelectasis in the bilateral lower lobes. There some patchy ground-glass opacities in the right middle lobe. There is a calcified granuloma in the right lower lobe. Hepatobiliary: No focal liver abnormality is seen. No gallstones, gallbladder wall thickening, or biliary dilatation. Pancreas: Unremarkable. No pancreatic ductal dilatation or surrounding inflammatory changes. Spleen: Normal in size without focal abnormality. Adrenals/Urinary Tract: There are 2 left renal cysts. The largest measures 5.6 cm. Otherwise, the kidneys, adrenal glands, and bladder are within normal limits. Stomach/Bowel: Stomach is within normal limits. Appendix appears normal. No evidence of bowel wall thickening, distention, or inflammatory changes. There is sigmoid colon diverticulosis. Vascular/Lymphatic: Aortic atherosclerosis. No enlarged abdominal or pelvic lymph nodes. Reproductive: Uterus and bilateral adnexa are unremarkable. Other: There is new presacral edema. There is no ascites or focal abdominal wall hernia. There is mild body wall edema. Small amount of subcutaneous air in the lower anterior abdominal wall is likely related to medication injection sites. Musculoskeletal:  Osseous metastatic lesions are again noted most significant in the left iliac wing and L5 vertebral body similar to the prior study. IMPRESSION: 1. New presacral edema and mild body wall edema. 2. New patchy ground-glass opacities in the right middle lobe may represent infectious/inflammatory process. 3. Stable osseous metastatic disease. 4. Left Bosniak I benign renal cyst measuring 5.6 cm. No follow-up imaging is recommended. JACR 2018 Feb; 264-273, Management of the Incidental Renal Mass on CT, [employer] 2021; [id number], Bosniak Classification of Cystic Renal Masses, Version 2019. Aortic Atherosclerosis (ICD10-I70.0). Electronically Signed   By: Darliss Cheney M.D.   On: 07/24/2022 01:23   CT Angio Chest Pulmonary Embolism (PE) W or WO Contrast  Result Date: 07/24/2022 CLINICAL DATA:  Pulmonary embolism (PE) suspected, low to intermediate prob, neg D-dimer. Metastatic breast cancer. History of pulmonary embolus. Generalized weakness EXAM: CT ANGIOGRAPHY CHEST WITH CONTRAST TECHNIQUE: Multidetector CT imaging of the chest was performed using the standard protocol during bolus administration of intravenous contrast. Multiplanar CT image reconstructions and MIPs were obtained to evaluate the vascular anatomy. RADIATION DOSE REDUCTION: This exam was performed according to the departmental dose-optimization program which includes automated exposure control, adjustment of the mA and/or kV according to patient size and/or use of iterative reconstruction technique. CONTRAST:  60mL OMNIPAQUE IOHEXOL 350 MG/ML SOLN COMPARISON:  PET CT 03/29/2022 FINDINGS: Cardiovascular: Heart is normal size. Aorta is normal caliber. Scattered aortic calcifications. No filling defects in the pulmonary arteries to suggest pulmonary emboli. Mediastinum/Nodes: No mediastinal, hilar, or axillary adenopathy. Trachea and esophagus are unremarkable.  Thyroid unremarkable. Lungs/Pleura: Mild centrilobular emphysema. Dependent opacities in the  lungs compatible with dependent atelectasis. Calcified granuloma in the right lower lobe. Airspace opacity noted in the posterior right upper lobe. Cannot exclude pneumonia. No effusions. Upper Abdomen: No acute findings Musculoskeletal: Prior left mastectomy. Chest wall soft tissues are unremarkable. Bilateral scapular osseous lesions compatible with metastases. Review of the MIP images confirms the above findings. IMPRESSION: No evidence of pulmonary embolus. Dependent atelectasis. Airspace opacity noted in the posterior right upper lobe concerning for pneumonia. Osseous metastatic disease involving the scapulae. Aortic Atherosclerosis (ICD10-I70.0) and Emphysema (ICD10-J43.9). Electronically Signed   By: Charlett Nose M.D.   On: 07/24/2022 01:02   DG Chest Port 1 View  Result Date: 07/23/2022 CLINICAL DATA:  Sepsis. EXAM: PORTABLE CHEST 1 VIEW COMPARISON:  Chest radiograph dated 07/18/2022. FINDINGS: No focal consolidation, pleural effusion, pneumothorax. Mild diffuse chronic intra coarsening. The cardiac silhouette is within normal limits. No acute osseous pathology. IMPRESSION: No active disease. Electronically Signed   By: Elgie Collard M.D.   On: 07/23/2022 20:42        Scheduled Meds:  (feeding supplement) PROSource Plus  30 mL Oral TID BM   vitamin C  500 mg Oral BID   aspirin EC  81 mg Oral QHS   enoxaparin (LOVENOX) injection  40 mg Subcutaneous Q24H   exemestane  25 mg Oral QPC breakfast   feeding supplement  237 mL Oral TID BM   folic acid  1 mg Oral Daily   levothyroxine  100 mcg Oral Q0600   metoprolol tartrate  12.5 mg Oral BID   multivitamin with minerals  1 tablet Oral Daily   pantoprazole  20 mg Oral Daily   potassium chloride  40 mEq Oral Daily   rosuvastatin  5 mg Oral Daily   sodium chloride flush  3 mL Intravenous Q12H   thiamine  100 mg Oral Daily   traZODone  50 mg Oral QHS   zinc sulfate  220 mg Oral Daily   Continuous Infusions:  ceFEPime (MAXIPIME) IV 2 g  (07/25/22 0529)   vancomycin 1,000 mg (07/25/22 0008)     LOS: 5 days    Time spent: 25 mins    Charise Killian, MD [facility] Pager [phone]  If 7PM-7AM, please contact night-coverage [email or web address] 07/25/2022, 7:42 AM

## 2022-07-25 NOTE — Progress Notes (Signed)
PT Cancellation Note  Patient Details Name: Alicia [name] MRN: [id number] DOB: [birth year]   Cancelled Treatment:     PT attempt. PT hold. Pt asleep upon arrival. Does awake but remains very lethargic. " I really just need to get caught up on rest. Can you come back later?"   Rushie Chestnut 07/25/2022, 2:36 PM

## 2022-07-25 NOTE — Progress Notes (Signed)
Patient's son Viviann Spare "[name]" would like update call as early as possible, earlier than yesterday [phone]

## 2022-07-26 ENCOUNTER — Inpatient Hospital Stay: Payer: Medicare Other | Admitting: Oncology

## 2022-07-26 ENCOUNTER — Inpatient Hospital Stay: Payer: Medicare Other

## 2022-07-26 ENCOUNTER — Inpatient Hospital Stay: Payer: Medicare Other | Admitting: Pharmacist

## 2022-07-26 DIAGNOSIS — R4182 Altered mental status, unspecified: Secondary | ICD-10-CM | POA: Diagnosis not present

## 2022-07-26 DIAGNOSIS — J189 Pneumonia, unspecified organism: Secondary | ICD-10-CM | POA: Diagnosis not present

## 2022-07-26 LAB — CBC
HCT: 25.5 % — ABNORMAL LOW (ref 36.0–46.0)
Hemoglobin: 8.4 g/dL — ABNORMAL LOW (ref 12.0–15.0)
MCH: 26.5 pg (ref 26.0–34.0)
MCHC: 32.9 g/dL (ref 30.0–36.0)
MCV: 80.4 fL (ref 80.0–100.0)
Platelets: 157 10*3/uL (ref 150–400)
RBC: 3.17 MIL/uL — ABNORMAL LOW (ref 3.87–5.11)
RDW: 17 % — ABNORMAL HIGH (ref 11.5–15.5)
WBC: 3.7 10*3/uL — ABNORMAL LOW (ref 4.0–10.5)
nRBC: 0 % (ref 0.0–0.2)

## 2022-07-26 LAB — BASIC METABOLIC PANEL
Anion gap: 12 (ref 5–15)
BUN: 15 mg/dL (ref 8–23)
CO2: 22 mmol/L (ref 22–32)
Calcium: 8.7 mg/dL — ABNORMAL LOW (ref 8.9–10.3)
Chloride: 105 mmol/L (ref 98–111)
Creatinine, Ser: 1.3 mg/dL — ABNORMAL HIGH (ref 0.44–1.00)
GFR, Estimated: 43 mL/min — ABNORMAL LOW (ref >60)
Glucose, Bld: 90 mg/dL (ref 70–99)
Potassium: 3.3 mmol/L — ABNORMAL LOW (ref 3.5–5.1)
Sodium: 139 mmol/L (ref 135–145)

## 2022-07-26 LAB — PHOSPHORUS: Phosphorus: 2.7 mg/dL (ref 2.5–4.6)

## 2022-07-26 LAB — MAGNESIUM: Magnesium: 1.8 mg/dL (ref 1.7–2.4)

## 2022-07-26 MED ORDER — POTASSIUM CHLORIDE 20 MEQ PO PACK
40.0000 meq | PACK | Freq: Once | ORAL | Status: AC
  Administered 2022-07-26: 40 meq via ORAL
  Filled 2022-07-26: qty 2

## 2022-07-26 NOTE — TOC Progression Note (Signed)
Transition of Care [facility]) - CM/SW Discharge Note   Patient Details  Name: Alicia [name] MRN: [id number] Date of Birth: 04-27-1950  Transition of [facility] CM/SW Contact:  Garret Reddish, RN Phone Number: 07/26/2022, 1:56 PM   Clinical Narrative:   Chart reviewed.  Noted that patient continues to receive IV antibiotics for Sepsis and PNA.  Patient is currently on IV Cefepime.    I have spoken to her son March Rummage.  He informs me that he had been trying to pay her mortgage and he was unable to access her mortgage payment on-line to make the payment.  He paid her mortgage however he was locked out of her account.  I have informed him that in some cases the bank will allow a provider's note to access funds when a patient is in the hospital.  I also informed him that some banks will need POA paperwork to allow access to the funds.  I have informed him that the hospital can only assist with POA for medical purposes Candelaria Stagers feels patient is cognitive enough to make that decision.  I have informed him that he may have to reach out to an [employer] attorney to assist with financial POA.   I have informed Mrs. Webb Silversmith that I have given an update to  [employer] and currently patient continues to have  bed offer at [facility].    TOC will continue to follow for discharge planning.              Patient Goals and CMS Choice      Discharge Placement                         Discharge Plan and Services Additional resources added to the After Visit Summary for                                       Social Determinants of Health (SDOH) Interventions SDOH Screenings   Food Insecurity: No Food Insecurity (07/18/2022)  Housing: Low Risk  (07/18/2022)  Transportation Needs: No Transportation Needs (07/18/2022)  Utilities: Not At Risk (07/18/2022)  Alcohol Screen: Low Risk  (03/09/2022)  Depression (PHQ2-9): Low Risk  (03/09/2022)  Financial Resource Strain: High Risk  (03/09/2022)  Physical Activity: Insufficiently Active (03/09/2022)  Social Connections: Socially Isolated (03/09/2022)  Stress: No Stress Concern Present (03/09/2022)  Tobacco Use: Medium Risk (07/18/2022)     Readmission Risk Interventions     No data to display

## 2022-07-26 NOTE — Progress Notes (Signed)
Occupational Therapy Treatment Patient Details Name: Alicia [name] MRN: [id number] DOB: 12/24/1950 Today's Date: 07/26/2022   History of present illness Alicia [name] is a 72 y.o. 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  Family said she had not taken any of her medicines for about a week prior to admission.   OT comments  Chart reviewed to date, pt greeted in bed, agreeable to OT intervention. Daughter in law, Alicia [name] present throughout session. Tx session targeted improving ADL performance during toileting tasks. Pt continues to require increased assist as compared to baseline for ADL/functionale mobility tasks. Please see further details below. Pt is left in bed, all needs met.OT will continue to follow acutely.     Recommendations for follow up therapy are one component of a multi-disciplinary discharge planning process, led by the attending physician.  Recommendations may be updated based on patient status, additional functional criteria and insurance authorization.    Assistance Recommended at Discharge Frequent or constant Supervision/Assistance  Patient can return home with the following  A lot of help with walking and/or transfers;A lot of help with bathing/dressing/bathroom;Assistance with cooking/housework;Direct supervision/assist for medications management;Assist for transportation;Help with stairs or ramp for entrance   Equipment Recommendations  BSC/3in1    Recommendations for Other Services      Precautions / Restrictions Precautions Precautions: Fall Restrictions Weight Bearing Restrictions: No       Mobility Bed Mobility Overal bed mobility: Needs Assistance Bed Mobility: Supine to Sit, Sit to Supine     Supine to sit: Max  assist Sit to supine: Max assist   General bed mobility comments: increased time, step by step vcs for sequencing    Transfers Overall transfer level: Needs assistance Equipment used: Rolling walker (2 wheels) Transfers: Sit to/from Stand, Bed to chair/wheelchair/BSC Sit to Stand: Min assist     Step pivot transfers: Min assist       MAX A +2 for boost up bed     Balance Overall balance assessment: Needs assistance   Sitting balance-Leahy Scale: Fair   Postural control: Posterior lean Standing balance support: Bilateral upper extremity supported, Reliant on assistive device for balance Standing balance-Leahy Scale: Poor                             ADL either performed or assessed with clinical judgement   ADL Overall ADL's : Needs assistance/impaired             Lower Body Bathing: Maximal assistance;Sit to/from stand       Lower Body Dressing: Maximal assistance   Toilet Transfer: Minimal assistance;Rollator (4 wheels) Toilet Transfer Details (indicate cue type and reason): short amb tranfser to bsc with step by step vcs Toileting- Clothing Manipulation and Hygiene: Maximal assistance Toileting - Clothing Manipulation Details (indicate cue type and reason): after continent of urine on bsc              Cognition Arousal/Alertness: Awake/alert Behavior During Therapy: Flat affect Overall Cognitive Status: Impaired/Different from baseline Area of Impairment: Memory, Orientation, Following commands, Problem solving, Attention, Safety/judgement, Awareness                 Orientation Level: Disoriented to, Time Current Attention Level: Focused Memory: Decreased short-term memory, Decreased recall of precautions Following  Commands: Follows one step commands with increased time Safety/Judgement: Decreased awareness of safety, Decreased awareness of deficits Awareness: Intellectual Problem Solving: Slow processing, Difficulty sequencing,  Decreased initiation, Requires verbal cues, Requires tactile cues          Exercises Other Exercises Other Exercises: edu re: importance of continued mobility, sitting in chair for a meal today, pt is in agreement       General Comments HR 117, spo2 94% following mobility    Pertinent Vitals/ Pain       Pain Assessment Pain Assessment: Faces Faces Pain Scale: Hurts a little bit Pain Location: L shoulder with movement Pain Descriptors / Indicators: Discomfort Pain Intervention(s): Monitored during session   Frequency  Min 1X/week        Progress Toward Goals  OT Goals(current goals can now be found in the care plan section)  Progress towards OT goals: Progressing toward goals     Plan Discharge plan remains appropriate    Co-evaluation                 AM-PAC OT "6 Clicks" Daily Activity     Outcome Measure   Help from another person eating meals?: A Little Help from another person taking care of personal grooming?: A Little Help from another person toileting, which includes using toliet, bedpan, or urinal?: A Lot Help from another person bathing (including washing, rinsing, drying)?: A Lot Help from another person to put on and taking off regular upper body clothing?: A Lot Help from another person to put on and taking off regular lower body clothing?: A Lot 6 Click Score: 14    End of Session Equipment Utilized During Treatment: Rolling walker (2 wheels)  OT Visit Diagnosis: Unsteadiness on feet (R26.81);Muscle weakness (generalized) (M62.81)   Activity Tolerance Patient limited by fatigue   Patient Left in bed;with call bell/phone within reach;with chair alarm set     Time: 1610-9604 OT Time Calculation (min): 20 min  Charges: OT General Charges $OT Visit: 1 Visit OT Treatments $Self Care/Home Management : 8-22 mins Oleta Mouse, OTD OTR/L  07/26/22, 12:31 PM

## 2022-07-26 NOTE — Progress Notes (Signed)
PROGRESS NOTE    Girl Schissler [facility]  ZOX:[id number] DOB: [birth year] DOA: 07/18/2022 PCP: Corky Downs, MD    Assessment & Plan:   Principal Problem:   Altered mental status Active Problems:   AKI (acute kidney injury) (HCC)   Electrolyte abnormality   Generalized weakness   QT prolongation   Elevated troponin   Diarrhea   Malignant neoplasm metastatic to bone (HCC)   Primary hypertension   Sepsis (HCC)   Hypokalemia   Hypophosphatemia   Acute metabolic encephalopathy   Malnutrition of moderate degree   Acute UTI   Pressure injury of skin   Pneumonia   Palliative care encounter  Assessment and Plan: Acute metabolic encephalopathy: mental status has improved today. Possibly secondary to infection. Continue on IV abxs    Sepsis: see [name] notes on how pt mets sepsis criteria. Secondary to right upper lobe pneumonia. Continue on IV cefepime. Sepsis resolved    Acute UTI: Urine culture showed Klebsiella oxytoca.  Initial plan was to complete cefdinir on 08/04/2022.  However, she has been restarted on IV antibiotics for sepsis and pneumonia.     AKI on CKDIIIb: Cr is labile. Avoid nephrotoxic meds   Hypokalemia: KCl repleated    Hypophosphatemia: WNL today    Prolonged Qtc: continue on tele  Elevated troponins: likely secondary to demand ischemia    Chronic diarrhea: imodium prn    Stage IV breast cancer: w/ metastasis to the bone.  Outpatient follow-up with [name], oncologist. No chemo will be given at SNF    Stage II coccygeal decubitus ulcer: present on admission. Continue on wound care       DVT prophylaxis: lovenox  Code Status: full Family Communication: Disposition Plan: PT/OT recs SNF  Level of care: Telemetry Medical Status is: Inpatient Remains inpatient appropriate because: severity of illness    Consultants:    Procedures:   Antimicrobials: cefepime    Subjective: Pt c/o malaise   Objective: Vitals:   07/26/22 0402  07/26/22 0619 07/26/22 0749 07/26/22 0751  BP:  (!) 140/62 (!) 143/57 (!) 143/57  Pulse:  95 90 91  Resp:  18 17   Temp:  99.8 F (37.7 C) 99.3 F (37.4 C) 99.3 F (37.4 C)  TempSrc:  Oral Oral Oral  SpO2:  (!) 89% 92% 92%  Weight: 86.6 kg     Height:        Intake/Output Summary (Last 24 hours) at 07/26/2022 0816 Last data filed at 07/25/2022 1500 Gross per 24 hour  Intake 500.35 ml  Output --  Net 500.35 ml   Filed Weights   07/22/22 0108 07/24/22 0500 07/26/22 0402  Weight: 90.3 kg 88.1 kg 86.6 kg    Examination:  General exam: Appears comfortable.   Respiratory system: decreased breath sounds b/l  Cardiovascular system: S1/S2+. No rubs or gallops  Gastrointestinal system: abd is soft, NT, obese & hypoactive bowel sounds  Central nervous system: alert & awake. Moves all extremities  Psychiatry: Judgement and insight appears improved. Flat mood and affect     Data Reviewed: I have personally reviewed following labs and imaging studies  CBC: Recent Labs  Lab 07/23/22 2228 07/25/22 0521 07/26/22 0333  WBC 3.8* 4.2 3.7*  NEUTROABS 3.0 3.1  --   HGB 8.5* 9.1* 8.4*  HCT 26.4* 28.4* 25.5*  MCV 82.5 82.6 80.4  PLT 146* 155 157   Basic Metabolic Panel: Recent Labs  Lab 07/20/22 0416 07/21/22 0630 07/22/22 8119 07/23/22 1478 07/23/22 2228 07/25/22 2956  07/26/22 0333  NA 136 140 137 137 135 138 139  K 2.8* 3.4* 3.1* 3.4* 3.7 2.9* 3.3*  CL 101 103 100 104 103 103 105  CO2 26 26 25 25 24 23 22   GLUCOSE 109* 107* 111* 104* 115* 101* 90  BUN 13 12 15 14 16 13 15   CREATININE 1.63* 1.56* 1.53* 1.54* 1.52* 1.27* 1.30*  CALCIUM 8.0* 8.5* 8.0* 8.3* 8.3* 8.6* 8.7*  MG 1.8 1.7  --  2.0  --   --   --   PHOS 2.8 1.9* 2.6  --   --   --  2.7   GFR: Estimated Creatinine Clearance: 42.7 mL/min (A) (by C-G formula based on SCr of 1.3 mg/dL (H)). Liver Function Tests: Recent Labs  Lab 07/22/22 0841 07/23/22 2228  AST  --  295*  ALT  --  121*  ALKPHOS  --  117   BILITOT  --  0.4  PROT  --  5.4*  ALBUMIN 2.7* 2.3*   No results for input(s): "LIPASE", "AMYLASE" in the last 168 hours. No results for input(s): "AMMONIA" in the last 168 hours. Coagulation Profile: Recent Labs  Lab 07/23/22 2118 07/23/22 2228  INR 1.8* 1.3*   Cardiac Enzymes: No results for input(s): "CKTOTAL", "CKMB", "CKMBINDEX", "TROPONINI" in the last 168 hours. BNP (last 3 results) No results for input(s): "PROBNP" in the last 8760 hours. HbA1C: No results for input(s): "HGBA1C" in the last 72 hours. CBG: No results for input(s): "GLUCAP" in the last 168 hours.  Lipid Profile: No results for input(s): "CHOL", "HDL", "LDLCALC", "TRIG", "CHOLHDL", "LDLDIRECT" in the last 72 hours. Thyroid Function Tests: No results for input(s): "TSH", "T4TOTAL", "FREET4", "T3FREE", "THYROIDAB" in the last 72 hours. Anemia Panel: No results for input(s): "VITAMINB12", "FOLATE", "FERRITIN", "TIBC", "IRON", "RETICCTPCT" in the last 72 hours. Sepsis Labs: Recent Labs  Lab 07/23/22 2223 07/23/22 2228 07/24/22 0007 07/25/22 0521  PROCALCITON  --  0.54  --  0.65  LATICACIDVEN 1.5  --  1.4  --     Recent Results (from the past 240 hour(s))  Culture, blood (Routine x 2)     Status: None   Collection Time: 07/18/22  4:52 PM   Specimen: BLOOD  Result Value Ref Range Status   Specimen Description BLOOD RIGHT ANTECUBITAL  Final   Special Requests   Final    BOTTLES DRAWN AEROBIC AND ANAEROBIC Blood Culture results may not be optimal due to an excessive volume of blood received in culture bottles   Culture   Final    NO GROWTH 5 DAYS Performed at [facility], [address] [place], [place] [address]    Report Status 07/23/2022 FINAL  Final  Culture, blood (Routine x 2)     Status: None   Collection Time: 07/18/22  5:08 PM   Specimen: BLOOD  Result Value Ref Range Status   Specimen Description BLOOD BLOOD RIGHT ARM  Final   Special Requests   Final    BOTTLES DRAWN  AEROBIC AND ANAEROBIC Blood Culture adequate volume   Culture   Final    NO GROWTH 5 DAYS Performed at [facility], [address] [place], [place] [address]    Report Status 07/23/2022 FINAL  Final  Urine Culture (for pregnant, neutropenic or urologic patients or patients with an indwelling urinary catheter)     Status: Abnormal   Collection Time: 07/18/22  8:30 PM   Specimen: Urine, Clean Catch  Result Value Ref Range Status  Specimen Description   Final    URINE, CLEAN CATCH Performed at [facility], [address] [place], [place] [address]    Special Requests   Final    NONE Performed at [facility], [address] [place], [place] [address]    Culture >=100,000 COLONIES/mL KLEBSIELLA OXYTOCA (A)  Final   Report Status 07/21/2022 FINAL  Final   Organism ID, Bacteria KLEBSIELLA OXYTOCA (A)  Final      Susceptibility   Klebsiella oxytoca - MIC*    AMPICILLIN >=32 RESISTANT Resistant     CEFEPIME <=0.12 SENSITIVE Sensitive     CEFTRIAXONE <=0.25 SENSITIVE Sensitive     CIPROFLOXACIN <=0.25 SENSITIVE Sensitive     GENTAMICIN <=1 SENSITIVE Sensitive     IMIPENEM <=0.25 SENSITIVE Sensitive     NITROFURANTOIN <=16 SENSITIVE Sensitive     TRIMETH/SULFA <=20 SENSITIVE Sensitive     AMPICILLIN/SULBACTAM 16 INTERMEDIATE Intermediate     PIP/TAZO <=4 SENSITIVE Sensitive     * >=100,000 COLONIES/mL KLEBSIELLA OXYTOCA  Respiratory (~20 pathogens) panel by PCR     Status: None   Collection Time: 07/23/22  9:10 PM   Specimen: Nasopharyngeal Swab; Respiratory  Result Value Ref Range Status   Adenovirus NOT DETECTED NOT DETECTED Final   Coronavirus 229E NOT DETECTED NOT DETECTED Final    Comment: (NOTE) The Coronavirus on the Respiratory Panel, DOES NOT test for the novel  Coronavirus (2019 nCoV)    Coronavirus HKU1 NOT DETECTED NOT DETECTED Final   Coronavirus NL63 NOT DETECTED NOT DETECTED Final   Coronavirus OC43 NOT DETECTED NOT DETECTED Final    Metapneumovirus NOT DETECTED NOT DETECTED Final   Rhinovirus / Enterovirus NOT DETECTED NOT DETECTED Final   Influenza A NOT DETECTED NOT DETECTED Final   Influenza B NOT DETECTED NOT DETECTED Final   Parainfluenza Virus 1 NOT DETECTED NOT DETECTED Final   Parainfluenza Virus 2 NOT DETECTED NOT DETECTED Final   Parainfluenza Virus 3 NOT DETECTED NOT DETECTED Final   Parainfluenza Virus 4 NOT DETECTED NOT DETECTED Final   Respiratory Syncytial Virus NOT DETECTED NOT DETECTED Final   Bordetella pertussis NOT DETECTED NOT DETECTED Final   Bordetella Parapertussis NOT DETECTED NOT DETECTED Final   Chlamydophila pneumoniae NOT DETECTED NOT DETECTED Final   Mycoplasma pneumoniae NOT DETECTED NOT DETECTED Final    Comment: Performed at [facility], [address], [place], [place] [address]  Culture, blood (x 2)     Status: None (Preliminary result)   Collection Time: 07/23/22  9:18 PM   Specimen: BLOOD  Result Value Ref Range Status   Specimen Description BLOOD RIGHT ANTECUBITAL  Final   Special Requests   Final    BOTTLES DRAWN AEROBIC AND ANAEROBIC Blood Culture adequate volume   Culture   Final    NO GROWTH 3 DAYS Performed at [facility], [address] [place], [place] [address]    Report Status PENDING  Incomplete  Culture, blood (x 2)     Status: None (Preliminary result)   Collection Time: 07/23/22  9:18 PM   Specimen: BLOOD  Result Value Ref Range Status   Specimen Description BLOOD BLOOD RIGHT HAND  Final   Special Requests   Final    BOTTLES DRAWN AEROBIC AND ANAEROBIC Blood Culture adequate volume   Culture   Final    NO GROWTH 3 DAYS Performed at [facility], [address], [place], [place] [address]    Report Status PENDING  Incomplete  MRSA Next Gen by PCR, Nasal     Status: None   Collection Time: 07/25/22 12:18 PM   Specimen: Nasal Mucosa; Nasal Swab  Result Value Ref Range Status   MRSA by PCR Next Gen NOT DETECTED NOT DETECTED  Final    Comment: (NOTE) The GeneXpert MRSA Assay (FDA approved for NASAL specimens only), is one component of a comprehensive MRSA colonization surveillance program. It is not intended to diagnose MRSA infection nor to guide or monitor treatment for MRSA infections. Test performance is not FDA approved in patients less than 50 years old. Performed at [facility], [address] [place], [place] [address]          Radiology Studies: No results found.      Scheduled Meds:  (feeding supplement) PROSource Plus  30 mL Oral TID BM   vitamin C  500 mg Oral BID   aspirin EC  81 mg Oral QHS   enoxaparin (LOVENOX) injection  40 mg Subcutaneous Q24H   exemestane  25 mg Oral QPC breakfast   feeding supplement  237 mL Oral TID BM   folic acid  1 mg Oral Daily   levothyroxine  100 mcg Oral Q0600   metoprolol tartrate  12.5 mg Oral BID   multivitamin with minerals  1 tablet Oral Daily   pantoprazole  20 mg Oral Daily   potassium chloride  40 mEq Oral Daily   rosuvastatin  5 mg Oral Daily   sodium chloride flush  3 mL Intravenous Q12H   thiamine  100 mg Oral Daily   traZODone  50 mg Oral QHS   zinc sulfate  220 mg Oral Daily   Continuous Infusions:  ceFEPime (MAXIPIME) IV 2 g (07/26/22 0649)     LOS: 6 days    Time spent: 25 mins    Charise Killian, MD [facility] Pager [phone]  If 7PM-7AM, please contact night-coverage [email or web address] 07/26/2022, 8:16 AM

## 2022-07-26 NOTE — Progress Notes (Signed)
Physical Therapy Treatment Patient Details Name: Alicia [name] MRN: [id number] DOB: 1950-04-02 Today's Date: 07/26/2022   History of Present Illness Alicia [name] is a 72 y.o. 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  Family said she had not taken any of her medicines for about a week prior to admission.    PT Comments  Pt was long sitting in bed upon arrival with support family present. She is oriented to self but overall disoriented to situation and POC. Pt was able to consistently follow commands with increased time + TC/Vcs. Overall limited by fear and anxiety with OOB activity. She stood 2 x EOB prior to taking ~ 3 steps to recliner. Constant assistance and cueing for safety, sequencing, and technique improvements. Attempted to wean pt to rm air however pt desaturates to 84% on rm air. Reapplied O2 with so2 returning to > 90%. Acute PT will continue to follow and progress per current POC.      Assistance Recommended at Discharge Frequent or constant Supervision/Assistance  If plan is discharge home, recommend the following:  Can travel by private vehicle    A lot of help with walking and/or transfers;A lot of help with bathing/dressing/bathroom;Assistance with cooking/housework;Assistance with feeding;Help with stairs or ramp for entrance;Assist for transportation      Equipment Recommendations  Other (comment) (defer to next level of care)       Precautions / Restrictions Precautions Precautions: Fall Restrictions Weight Bearing Restrictions: No     Mobility  Bed Mobility Overal bed mobility: Needs Assistance Bed Mobility: Supine to Sit, Sit to Supine  Supine to sit: Max assist  General bed mobility comments: Pt was able to exit L side of bed  with max assist of one. Pt is severely limited by anxiety. Fear slowed progression more so than strength or physical deficits    Transfers Overall transfer level: Needs assistance Equipment used: Rolling walker (2 wheels) Transfers: Sit to/from Stand, Bed to chair/wheelchair/BSC Sit to Stand: Min assist  General transfer comment: Pt was able to stand and take steps to recliner but severely limited by anxiety/fear. constant min assist + cueing    Ambulation/Gait Ambulation/Gait assistance: Min assist Gait Distance (Feet): 3 Feet Assistive device: Rolling walker (2 wheels) Gait Pattern/deviations: Step-to pattern, Decreased step length - right, Decreased step length - left Gait velocity: decr  General Gait Details: pt was able to take ~ 3 steps form EOB to recliner with increased time + vcs for sequencing and relaxation.    Balance Overall balance assessment: Needs assistance Sitting-balance support: Feet supported Sitting balance-Leahy Scale: Fair     Standing balance support: Bilateral upper extremity supported, Reliant on assistive device for balance Standing balance-Leahy Scale: Poor Standing balance comment: high fall risk       Cognition Arousal/Alertness: Awake/alert Behavior During Therapy: Flat affect Overall Cognitive Status: Impaired/Different from baseline (per family who was present)    Orientation Level: Disoriented to, Place, Time, Situation Current Attention Level: Focused Memory: Decreased recall of precautions, Decreased short-term memory Following Commands: Follows one step commands with increased time Safety/Judgement: Decreased awareness of safety, Decreased awareness of deficits   Problem Solving: Slow processing, Difficulty sequencing, Decreased initiation, Requires verbal cues, Requires tactile cues General Comments: Pt is alert and cooperative however presents with more cognition deficits than her baseline (per  daughter in law)                Pertinent Vitals/Pain Pain Assessment Pain Assessment: No/denies pain Faces Pain Scale: No hurt Pain Location: L shoulder with movement Pain Descriptors / Indicators: Discomfort Pain Intervention(s): Monitored during session, Premedicated before session, Repositioned     PT Goals (current goals can now be found in the care plan section) Acute Rehab PT Goals Patient Stated Goal: none stated Progress towards PT goals: Progressing toward goals    Frequency    Min 1X/week      PT Plan Current plan remains appropriate       AM-PAC PT "6 Clicks" Mobility   Outcome Measure  Help needed turning from your back to your side while in a flat bed without using bedrails?: A Little Help needed moving from lying on your back to sitting on the side of a flat bed without using bedrails?: A Little Help needed moving to and from a bed to a chair (including a wheelchair)?: A Lot Help needed standing up from a chair using your arms (e.g., wheelchair or bedside chair)?: A Lot Help needed to walk in hospital room?: A Lot Help needed climbing 3-5 steps with a railing? : Total 6 Click Score: 13    End of Session Equipment Utilized During Treatment: Gait belt Activity Tolerance: Patient tolerated treatment well;Patient limited by fatigue;Other (comment) (limited by fear/anxiety with movements) Patient left: in chair;with call bell/phone within reach;with chair alarm set Nurse Communication: Mobility status PT Visit Diagnosis: Muscle weakness (generalized) (M62.81);Difficulty in walking, not elsewhere classified (R26.2)     Time: 6045-4098 PT Time Calculation (min) (ACUTE ONLY): 18 min  Charges:    $Therapeutic Activity: 8-22 mins PT General Charges $$ ACUTE PT VISIT: 1 Visit                    Jetta Lout PTA 07/26/22, 4:41 PM

## 2022-07-27 DIAGNOSIS — J189 Pneumonia, unspecified organism: Secondary | ICD-10-CM | POA: Diagnosis not present

## 2022-07-27 DIAGNOSIS — R4182 Altered mental status, unspecified: Secondary | ICD-10-CM | POA: Diagnosis not present

## 2022-07-27 LAB — BASIC METABOLIC PANEL
Anion gap: 7 (ref 5–15)
BUN: 11 mg/dL (ref 8–23)
CO2: 23 mmol/L (ref 22–32)
Calcium: 8.4 mg/dL — ABNORMAL LOW (ref 8.9–10.3)
Chloride: 106 mmol/L (ref 98–111)
Creatinine, Ser: 1.07 mg/dL — ABNORMAL HIGH (ref 0.44–1.00)
GFR, Estimated: 55 mL/min — ABNORMAL LOW (ref >60)
Glucose, Bld: 97 mg/dL (ref 70–99)
Potassium: 3.3 mmol/L — ABNORMAL LOW (ref 3.5–5.1)
Sodium: 136 mmol/L (ref 135–145)

## 2022-07-27 LAB — CBC
HCT: 22.8 % — ABNORMAL LOW (ref 36.0–46.0)
Hemoglobin: 7.6 g/dL — ABNORMAL LOW (ref 12.0–15.0)
MCH: 26.9 pg (ref 26.0–34.0)
MCHC: 33.3 g/dL (ref 30.0–36.0)
MCV: 80.6 fL (ref 80.0–100.0)
Platelets: 156 10*3/uL (ref 150–400)
RBC: 2.83 MIL/uL — ABNORMAL LOW (ref 3.87–5.11)
RDW: 17.4 % — ABNORMAL HIGH (ref 11.5–15.5)
WBC: 3.9 10*3/uL — ABNORMAL LOW (ref 4.0–10.5)
nRBC: 0 % (ref 0.0–0.2)

## 2022-07-27 LAB — CULTURE, BLOOD (ROUTINE X 2): Culture: NO GROWTH

## 2022-07-27 MED ORDER — SODIUM CHLORIDE 0.9 % IV SOLN
1.5000 g | Freq: Four times a day (QID) | INTRAVENOUS | Status: DC
  Administered 2022-07-27 – 2022-08-01 (×19): 1.5 g via INTRAVENOUS
  Filled 2022-07-27 (×2): qty 4
  Filled 2022-07-27: qty 1.5
  Filled 2022-07-27: qty 4
  Filled 2022-07-27: qty 1.5
  Filled 2022-07-27 (×9): qty 4
  Filled 2022-07-27: qty 1.5
  Filled 2022-07-27 (×5): qty 4
  Filled 2022-07-27: qty 1.5
  Filled 2022-07-27: qty 4

## 2022-07-27 MED ORDER — POTASSIUM CHLORIDE 20 MEQ PO PACK
60.0000 meq | PACK | Freq: Once | ORAL | Status: AC
  Administered 2022-07-27: 60 meq via ORAL
  Filled 2022-07-27: qty 3

## 2022-07-27 NOTE — Progress Notes (Signed)
Physical Therapy Treatment Patient Details Name: Alicia [name] MRN: [id number] DOB: 05/04/1950 Today's Date: 07/27/2022   History of Present Illness Alicia [name] is a 72 y.o. 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  Family said she had not taken any of her medicines for about a week prior to admission.    PT Comments  Pt was long sitting in bed upon arrival. She is oriented to self only but is able to follow simple commands with increased time. Pt does require encouragement but once agreeable, remained cooperative throughout session. Increased time required to perform and process all desired task. She was able to tolerate getting OOB (R side) standing to RW and ambulate ~ 5 ft to recliner. She took seated rest prior to ambulation short distance into hallway. Pt on 2 L o2 with sao2 dropping to 90 % but never lower. HR around 100 Bpm during gait. She endorses fatigue after minimal gait distances. Family did provide chair follow for additional safety. Pt will benefit form continued skilled PT to maximize her independence and safety with all ADLs while decreasing caregiver burden.     Assistance Recommended at Discharge Frequent or constant Supervision/Assistance  If plan is discharge home, recommend the following:  Can travel by private vehicle    A little help with walking and/or transfers;A lot of help with bathing/dressing/bathroom;Assistance with cooking/housework;Direct supervision/assist for medications management;Direct supervision/assist for financial management;Assist for transportation;Help with stairs or ramp for entrance      Equipment Recommendations  Other (comment) (Defer to next level of care)       Precautions / Restrictions  Precautions Precautions: Fall Restrictions Weight Bearing Restrictions: No     Mobility  Bed Mobility Overal bed mobility: Needs Assistance Bed Mobility: Supine to Sit, Sit to Supine  Supine to sit: Min assist, Mod assist, HOB elevated  General bed mobility comments: increased time required to perform    Transfers Overall transfer level: Needs assistance Equipment used: Rolling walker (2 wheels) Transfers: Sit to/from Stand Sit to Stand: Min assist General transfer comment: pt stood 1 x EOB and 2 x from recliner    Ambulation/Gait Ambulation/Gait assistance: Min Chemical engineer (Feet): 15 Feet Assistive device: Rolling walker (2 wheels) Gait Pattern/deviations: Step-to pattern, Trunk flexed Gait velocity: decr  General Gait Details: Pt was able to ambulate ~ 15 ft with RW with slow cautious gait kinematics. pt limits distance due to fatigue but HR and O2 stable on 2 L o2. sao2 >90% on 2 L. Attempted wean previous date with pt quickly desaturating         Cognition Arousal/Alertness: Awake/alert Behavior During Therapy: Flat affect Overall Cognitive Status: History of cognitive impairments - at baseline Area of Impairment: Memory, Orientation, Following commands, Problem solving, Attention, Safety/judgement, Awareness    Orientation Level: Disoriented to, Place, Time, Situation      General Comments: pt is alert but disoriented. Does follow commands with increased time. Vcs throughout for encouragement               Pertinent Vitals/Pain Pain Assessment Pain Assessment: No/denies pain Faces Pain Scale: No hurt     PT Goals (current goals can now be found in the care plan section) Acute Rehab PT Goals Patient Stated Goal: none stated Progress towards PT goals: Progressing toward goals  Frequency    Min 1X/week      PT Plan Current plan remains appropriate       AM-PAC PT "6 Clicks" Mobility   Outcome Measure  Help needed turning from  your back to your side while in a flat bed without using bedrails?: A Little Help needed moving from lying on your back to sitting on the side of a flat bed without using bedrails?: A Lot Help needed moving to and from a bed to a chair (including a wheelchair)?: A Little Help needed standing up from a chair using your arms (e.g., wheelchair or bedside chair)?: A Lot Help needed to walk in hospital room?: A Little Help needed climbing 3-5 steps with a railing? : A Lot 6 Click Score: 15    End of Session   Activity Tolerance: Patient limited by fatigue Patient left: in chair;with call bell/phone within reach;with chair alarm set;with family/visitor present;with nursing/sitter in room Nurse Communication: Mobility status PT Visit Diagnosis: Muscle weakness (generalized) (M62.81);Difficulty in walking, not elsewhere classified (R26.2)     Time: 6045-4098 PT Time Calculation (min) (ACUTE ONLY): 18 min  Charges:    $Gait Training: 8-22 mins PT General Charges $$ ACUTE PT VISIT: 1 Visit                     Jetta Lout PTA 07/27/22, 1:51 PM

## 2022-07-27 NOTE — Plan of Care (Signed)

## 2022-07-27 NOTE — Progress Notes (Signed)
PROGRESS NOTE    [facility]  ZOX:[id number] DOB: 12/21/1950 DOA: 07/18/2022 PCP: Corky Downs, MD    Assessment & Plan:   Principal Problem:   Altered mental status Active Problems:   AKI (acute kidney injury) (HCC)   Electrolyte abnormality   Generalized weakness   QT prolongation   Elevated troponin   Diarrhea   Malignant neoplasm metastatic to bone (HCC)   Primary hypertension   Sepsis (HCC)   Hypokalemia   Hypophosphatemia   Acute metabolic encephalopathy   Malnutrition of moderate degree   Acute UTI   Pressure injury of skin   Pneumonia   Palliative care encounter  Assessment and Plan: Acute metabolic encephalopathy: mental status is worse today, only oriented to person. Possibly secondary to infection. Changed IV abxs to IV unasyn and d/c cefepime.    Sepsis: see [name] notes on how pt mets sepsis criteria. Secondary to right upper lobe pneumonia. Continue on IV abxs Sepsis resolved    Acute UTI: Urine culture showed Klebsiella oxytoca.  Initial plan was to complete cefdinir on 08/04/2022.  However, she has been restarted on IV antibiotics for sepsis and pneumonia.     AKI on CKDIIIb: Cr is trending down today. Avoid nephrotoxic meds   Hypokalemia: KCl repleated    Hypophosphatemia: resolved   Prolonged Qtc: continue on tele  Elevated troponins: likely secondary to demand ischemia    Chronic diarrhea: imodium prn    Stage IV breast cancer: w/ metastasis to the bone.  Outpatient follow-up with [name], oncologist. No chemo will be given at SNF    Stage II coccygeal decubitus ulcer: present on admission. Continue w/ wound care       DVT prophylaxis: lovenox  Code Status: full Family Communication: discussed pt's care w/ pt's daughter at bedside and answered her questions  Disposition Plan: PT/OT recs SNF  Level of care: Telemetry Medical Status is: Inpatient Remains inpatient appropriate because: severity of  illness    Consultants:    Procedures:   Antimicrobials: unasyn    Subjective: Pt is confused and only oriented to self    Objective: Vitals:   07/26/22 0941 07/26/22 1958 07/27/22 0204 07/27/22 0404  BP: 138/60 (!) 129/54  130/60  Pulse: 91 92  96  Resp:  16  18  Temp: 98.5 F (36.9 C) 99.3 F (37.4 C)  98.7 F (37.1 C)  TempSrc: Oral   Oral  SpO2:  94%  90%  Weight:   86.6 kg   Height:       No intake or output data in the 24 hours ending 07/27/22 0756  Filed Weights   07/24/22 0500 07/26/22 0402 07/27/22 0204  Weight: 88.1 kg 86.6 kg 86.6 kg    Examination:  General exam: Appears confused   Respiratory system: diminished breath sounds b/l  Cardiovascular system: S1/S2+. No rubs or clicks  Gastrointestinal system: abd is soft, NT, obese & normal bowel sounds Central nervous system: Alert & oriented to self only  Psychiatry: Judgement and insight appears poor. Flat mood and affect    Data Reviewed: I have personally reviewed following labs and imaging studies  CBC: Recent Labs  Lab 07/23/22 2228 07/25/22 0521 07/26/22 0333 07/27/22 0337  WBC 3.8* 4.2 3.7* 3.9*  NEUTROABS 3.0 3.1  --   --   HGB 8.5* 9.1* 8.4* 7.6*  HCT 26.4* 28.4* 25.5* 22.8*  MCV 82.5 82.6 80.4 80.6  PLT 146* 155 157 156   Basic Metabolic Panel: Recent Labs  Lab 07/21/22 0630 07/22/22 0841 07/23/22 8119 07/23/22 2228 07/25/22 0521 07/26/22 0333 07/27/22 0337  NA 140 137 137 135 138 139 136  K 3.4* 3.1* 3.4* 3.7 2.9* 3.3* 3.3*  CL 103 100 104 103 103 105 106  CO2 26 25 25 24 23 22 23   GLUCOSE 107* 111* 104* 115* 101* 90 97  BUN 12 15 14 16 13 15 11   CREATININE 1.56* 1.53* 1.54* 1.52* 1.27* 1.30* 1.07*  CALCIUM 8.5* 8.0* 8.3* 8.3* 8.6* 8.7* 8.4*  MG 1.7  --  2.0  --   --  1.8  --   PHOS 1.9* 2.6  --   --   --  2.7  --    GFR: Estimated Creatinine Clearance: 51.9 mL/min (A) (by C-G formula based on SCr of 1.07 mg/dL (H)). Liver Function Tests: Recent Labs  Lab  07/22/22 0841 07/23/22 2228  AST  --  295*  ALT  --  121*  ALKPHOS  --  117  BILITOT  --  0.4  PROT  --  5.4*  ALBUMIN 2.7* 2.3*   No results for input(s): "LIPASE", "AMYLASE" in the last 168 hours. No results for input(s): "AMMONIA" in the last 168 hours. Coagulation Profile: Recent Labs  Lab 07/23/22 2118 07/23/22 2228  INR 1.8* 1.3*   Cardiac Enzymes: No results for input(s): "CKTOTAL", "CKMB", "CKMBINDEX", "TROPONINI" in the last 168 hours. BNP (last 3 results) No results for input(s): "PROBNP" in the last 8760 hours. HbA1C: No results for input(s): "HGBA1C" in the last 72 hours. CBG: No results for input(s): "GLUCAP" in the last 168 hours.  Lipid Profile: No results for input(s): "CHOL", "HDL", "LDLCALC", "TRIG", "CHOLHDL", "LDLDIRECT" in the last 72 hours. Thyroid Function Tests: No results for input(s): "TSH", "T4TOTAL", "FREET4", "T3FREE", "THYROIDAB" in the last 72 hours. Anemia Panel: No results for input(s): "VITAMINB12", "FOLATE", "FERRITIN", "TIBC", "IRON", "RETICCTPCT" in the last 72 hours. Sepsis Labs: Recent Labs  Lab 07/23/22 2223 07/23/22 2228 07/24/22 0007 07/25/22 0521  PROCALCITON  --  0.54  --  0.65  LATICACIDVEN 1.5  --  1.4  --     Recent Results (from the past 240 hour(s))  Culture, blood (Routine x 2)     Status: None   Collection Time: 07/18/22  4:52 PM   Specimen: BLOOD  Result Value Ref Range Status   Specimen Description BLOOD RIGHT ANTECUBITAL  Final   Special Requests   Final    BOTTLES DRAWN AEROBIC AND ANAEROBIC Blood Culture results may not be optimal due to an excessive volume of blood received in culture bottles   Culture   Final    NO GROWTH 5 DAYS Performed at [facility], [address], [place], [place] [address]    Report Status 07/23/2022 FINAL  Final  Culture, blood (Routine x 2)     Status: None   Collection Time: 07/18/22  5:08 PM   Specimen: BLOOD  Result Value Ref Range Status   Specimen  Description BLOOD BLOOD RIGHT ARM  Final   Special Requests   Final    BOTTLES DRAWN AEROBIC AND ANAEROBIC Blood Culture adequate volume   Culture   Final    NO GROWTH 5 DAYS Performed at [facility], [address] [place], [place] [address]    Report Status 07/23/2022 FINAL  Final  Urine Culture (for pregnant, neutropenic or urologic patients or patients with an indwelling urinary catheter)     Status: Abnormal   Collection Time: 07/18/22  8:30 PM   Specimen: Urine, Clean Catch  Result Value Ref Range Status   Specimen Description   Final    URINE, CLEAN CATCH Performed at [facility], [address] [place], [place] [address]    Special Requests   Final    NONE Performed at [facility], [address] [place], [place] [address]    Culture >=100,000 COLONIES/mL KLEBSIELLA OXYTOCA (A)  Final   Report Status 07/21/2022 FINAL  Final   Organism ID, Bacteria KLEBSIELLA OXYTOCA (A)  Final      Susceptibility   Klebsiella oxytoca - MIC*    AMPICILLIN >=32 RESISTANT Resistant     CEFEPIME <=0.12 SENSITIVE Sensitive     CEFTRIAXONE <=0.25 SENSITIVE Sensitive     CIPROFLOXACIN <=0.25 SENSITIVE Sensitive     GENTAMICIN <=1 SENSITIVE Sensitive     IMIPENEM <=0.25 SENSITIVE Sensitive     NITROFURANTOIN <=16 SENSITIVE Sensitive     TRIMETH/SULFA <=20 SENSITIVE Sensitive     AMPICILLIN/SULBACTAM 16 INTERMEDIATE Intermediate     PIP/TAZO <=4 SENSITIVE Sensitive     * >=100,000 COLONIES/mL KLEBSIELLA OXYTOCA  Respiratory (~20 pathogens) panel by PCR     Status: None   Collection Time: 07/23/22  9:10 PM   Specimen: Nasopharyngeal Swab; Respiratory  Result Value Ref Range Status   Adenovirus NOT DETECTED NOT DETECTED Final   Coronavirus 229E NOT DETECTED NOT DETECTED Final    Comment: (NOTE) The Coronavirus on the Respiratory Panel, DOES NOT test for the novel  Coronavirus (2019 nCoV)    Coronavirus HKU1 NOT DETECTED NOT DETECTED Final   Coronavirus  NL63 NOT DETECTED NOT DETECTED Final   Coronavirus OC43 NOT DETECTED NOT DETECTED Final   Metapneumovirus NOT DETECTED NOT DETECTED Final   Rhinovirus / Enterovirus NOT DETECTED NOT DETECTED Final   Influenza A NOT DETECTED NOT DETECTED Final   Influenza B NOT DETECTED NOT DETECTED Final   Parainfluenza Virus 1 NOT DETECTED NOT DETECTED Final   Parainfluenza Virus 2 NOT DETECTED NOT DETECTED Final   Parainfluenza Virus 3 NOT DETECTED NOT DETECTED Final   Parainfluenza Virus 4 NOT DETECTED NOT DETECTED Final   Respiratory Syncytial Virus NOT DETECTED NOT DETECTED Final   Bordetella pertussis NOT DETECTED NOT DETECTED Final   Bordetella Parapertussis NOT DETECTED NOT DETECTED Final   Chlamydophila pneumoniae NOT DETECTED NOT DETECTED Final   Mycoplasma pneumoniae NOT DETECTED NOT DETECTED Final    Comment: Performed at [facility], [address] [place], [place] [address]  Culture, blood (x 2)     Status: None (Preliminary result)   Collection Time: 07/23/22  9:18 PM   Specimen: BLOOD  Result Value Ref Range Status   Specimen Description BLOOD RIGHT ANTECUBITAL  Final   Special Requests   Final    BOTTLES DRAWN AEROBIC AND ANAEROBIC Blood Culture adequate volume   Culture   Final    NO GROWTH 4 DAYS Performed at [facility], [address], [place], [place] [address]    Report Status PENDING  Incomplete  Culture, blood (x 2)     Status: None (Preliminary result)   Collection Time: 07/23/22  9:18 PM   Specimen: BLOOD  Result Value Ref Range Status   Specimen Description BLOOD BLOOD RIGHT HAND  Final   Special Requests   Final    BOTTLES DRAWN AEROBIC AND ANAEROBIC Blood Culture adequate volume   Culture   Final    NO GROWTH 4 DAYS Performed at [facility], [address] [place], [place] [address]    Report Status PENDING  Incomplete  MRSA Next Gen by PCR, Nasal     Status: None   Collection Time: 07/25/22 12:18 PM   Specimen: Nasal Mucosa; Nasal  Swab  Result Value Ref Range Status   MRSA by PCR Next Gen NOT DETECTED NOT DETECTED Final    Comment: (NOTE) The GeneXpert MRSA Assay (FDA approved for NASAL specimens only), is one component of a comprehensive MRSA colonization surveillance program. It is not intended to diagnose MRSA infection nor to guide or monitor treatment for MRSA infections. Test performance is not FDA approved in patients less than 3 years old. Performed at [facility], [address] [place], [place] [address]          Radiology Studies: No results found.      Scheduled Meds:  (feeding supplement) PROSource Plus  30 mL Oral TID BM   vitamin C  500 mg Oral BID   aspirin EC  81 mg Oral QHS   enoxaparin (LOVENOX) injection  40 mg Subcutaneous Q24H   exemestane  25 mg Oral QPC breakfast   feeding supplement  237 mL Oral TID BM   folic acid  1 mg Oral Daily   levothyroxine  100 mcg Oral Q0600   metoprolol tartrate  12.5 mg Oral BID   multivitamin with minerals  1 tablet Oral Daily   pantoprazole  20 mg Oral Daily   rosuvastatin  5 mg Oral Daily   sodium chloride flush  3 mL Intravenous Q12H   thiamine  100 mg Oral Daily   traZODone  50 mg Oral QHS   zinc sulfate  220 mg Oral Daily   Continuous Infusions:  ceFEPime (MAXIPIME) IV 2 g (07/27/22 0629)     LOS: 7 days    Time spent: 25 mins    Charise Killian, MD [facility] Pager [phone]  If 7PM-7AM, please contact night-coverage [email or web address] 07/27/2022, 7:56 AM

## 2022-07-27 NOTE — Plan of Care (Signed)

## 2022-07-28 DIAGNOSIS — R4182 Altered mental status, unspecified: Secondary | ICD-10-CM | POA: Diagnosis not present

## 2022-07-28 DIAGNOSIS — J189 Pneumonia, unspecified organism: Secondary | ICD-10-CM | POA: Diagnosis not present

## 2022-07-28 LAB — CBC
HCT: 23.3 % — ABNORMAL LOW (ref 36.0–46.0)
Hemoglobin: 7.8 g/dL — ABNORMAL LOW (ref 12.0–15.0)
MCH: 26.8 pg (ref 26.0–34.0)
MCHC: 33.5 g/dL (ref 30.0–36.0)
MCV: 80.1 fL (ref 80.0–100.0)
Platelets: 166 10*3/uL (ref 150–400)
RBC: 2.91 MIL/uL — ABNORMAL LOW (ref 3.87–5.11)
RDW: 17.3 % — ABNORMAL HIGH (ref 11.5–15.5)
WBC: 4.6 10*3/uL (ref 4.0–10.5)
nRBC: 0 % (ref 0.0–0.2)

## 2022-07-28 LAB — BASIC METABOLIC PANEL
Anion gap: 8 (ref 5–15)
BUN: 10 mg/dL (ref 8–23)
CO2: 23 mmol/L (ref 22–32)
Calcium: 8.5 mg/dL — ABNORMAL LOW (ref 8.9–10.3)
Chloride: 107 mmol/L (ref 98–111)
Creatinine, Ser: 1 mg/dL (ref 0.44–1.00)
GFR, Estimated: 59 mL/min — ABNORMAL LOW (ref >60)
Glucose, Bld: 97 mg/dL (ref 70–99)
Potassium: 3.2 mmol/L — ABNORMAL LOW (ref 3.5–5.1)
Sodium: 138 mmol/L (ref 135–145)

## 2022-07-28 LAB — CULTURE, BLOOD (ROUTINE X 2)
Culture: NO GROWTH
Special Requests: ADEQUATE
Special Requests: ADEQUATE

## 2022-07-28 MED ORDER — MAGNESIUM SULFATE 2 GM/50ML IV SOLN
2.0000 g | Freq: Once | INTRAVENOUS | Status: AC
  Administered 2022-07-28: 2 g via INTRAVENOUS
  Filled 2022-07-28: qty 50

## 2022-07-28 MED ORDER — POTASSIUM CHLORIDE CRYS ER 20 MEQ PO TBCR
40.0000 meq | EXTENDED_RELEASE_TABLET | Freq: Two times a day (BID) | ORAL | Status: AC
  Administered 2022-07-28 (×2): 40 meq via ORAL
  Filled 2022-07-28 (×2): qty 2

## 2022-07-28 NOTE — Plan of Care (Signed)
  Problem: Education: Goal: Knowledge of General Education information will improve Description: Including pain rating scale, medication(s)/side effects and non-pharmacologic comfort measures Outcome: Progressing   Problem: Health Behavior/Discharge Planning: Goal: Ability to manage health-related needs will improve Outcome: Progressing   Problem: Clinical Measurements: Goal: Respiratory complications will improve Outcome: Progressing Goal: Cardiovascular complication will be avoided Outcome: Progressing   Problem: Pain Managment: Goal: General experience of comfort will improve Outcome: Progressing   Problem: Safety: Goal: Ability to remain free from injury will improve Outcome: Progressing

## 2022-07-28 NOTE — Plan of Care (Signed)
  Problem: Activity: Goal: Risk for activity intolerance will decrease Outcome: Progressing  Oob to bsc x 1 assist with rolling walker this shift; tolerated well Problem: Nutrition: Goal: Adequate nutrition will be maintained Outcome: Progressing  Drank vanilla Ensure Plus Problem: Pain Managment: Goal: General experience of comfort will improve Outcome: Progressing  PRN Tylenol for complaint of headache Problem: Safety: Goal: Ability to remain free from injury will improve Outcome: Progressing

## 2022-07-28 NOTE — Progress Notes (Addendum)
PROGRESS NOTE    Brittiany Messman [facility]  YNW:[id number] DOB: 1950-08-22 DOA: 07/18/2022 PCP: Corky Downs, MD    Assessment & Plan:   Principal Problem:   Altered mental status Active Problems:   AKI (acute kidney injury) (HCC)   Electrolyte abnormality   Generalized weakness   QT prolongation   Elevated troponin   Diarrhea   Malignant neoplasm metastatic to bone (HCC)   Primary hypertension   Sepsis (HCC)   Hypokalemia   Hypophosphatemia   Acute metabolic encephalopathy   Malnutrition of moderate degree   Acute UTI   Pressure injury of skin   Pneumonia   Palliative care encounter  Assessment and Plan: Acute metabolic encephalopathy: mental status is labile. Continue on IV unasyn. Possibly secondary to infection. D/c cefepime    Sepsis: see [name] notes on how pt mets sepsis criteria. Secondary to right upper lobe pneumonia. Continue on IV abxs. Sepsis resolved    Acute UTI: Urine culture showed Klebsiella oxytoca.  Initial plan was to complete cefdinir on 08/04/2022.  However, she has been restarted on IV antibiotics for sepsis and pneumonia.     AKI on CKDIIIb: Cr is trending down again today. Avoid nephrotoxic meds   Likely ACD: likely secondary to CKD. Will transfuse if Hb < 7.0   Hypokalemia: potassium given.    Hypophosphatemia: resolved   Prolonged Qtc: continue on tele  Elevated troponins: likely secondary to demand ischemia    Chronic diarrhea: imodium prn    Stage IV breast cancer: w/ metastasis to the bone.  Outpatient follow-up with [name], oncologist. No chemo will be given at SNF    Stage II coccygeal decubitus ulcer: present on admission. Continue w/ wound care       DVT prophylaxis: lovenox  Code Status: full Family Communication: discussed pt's care w/ pt's son, [name], and answered his questions Disposition Plan: PT/OT recs SNF  Level of care: Telemetry Medical Status is: Inpatient Remains inpatient appropriate because: severity  of illness    Consultants:    Procedures:   Antimicrobials: unasyn    Subjective: Pt is still confused   Objective: Vitals:   07/27/22 2023 07/28/22 0121 07/28/22 0456 07/28/22 0735  BP: 98/64  (!) 108/56 (!) 128/59  Pulse: 86  97 99  Resp: 20  20 20   Temp: 98.8 F (37.1 C)  99.5 F (37.5 C) 99.8 F (37.7 C)  TempSrc:   Oral Oral  SpO2: 95%  92% 91%  Weight:  87.2 kg    Height:        Intake/Output Summary (Last 24 hours) at 07/28/2022 0834 Last data filed at 07/28/2022 0546 Gross per 24 hour  Intake 240 ml  Output 1 ml  Net 239 ml    Filed Weights   07/26/22 0402 07/27/22 0204 07/28/22 0121  Weight: 86.6 kg 86.6 kg 87.2 kg    Examination:  General exam: Appears confused  Respiratory system: decreased breath sounds  Cardiovascular system: S1 & S2+. No rubs or clicks  Gastrointestinal system: abd is soft, NT, obese & hypoactive bowel sounds  Central nervous system: alert & oriented to self, place only  Psychiatry: judgement and insight appears poor. Frustrated mood and affect    Data Reviewed: I have personally reviewed following labs and imaging studies  CBC: Recent Labs  Lab 07/23/22 2228 07/25/22 0521 07/26/22 0333 07/27/22 0337 07/28/22 0618  WBC 3.8* 4.2 3.7* 3.9* 4.6  NEUTROABS 3.0 3.1  --   --   --  HGB 8.5* 9.1* 8.4* 7.6* 7.8*  HCT 26.4* 28.4* 25.5* 22.8* 23.3*  MCV 82.5 82.6 80.4 80.6 80.1  PLT 146* 155 157 156 166   Basic Metabolic Panel: Recent Labs  Lab 07/22/22 0841 07/23/22 0633 07/23/22 2228 07/25/22 0521 07/26/22 0333 07/27/22 0337 07/28/22 0618  NA 137 137 135 138 139 136 138  K 3.1* 3.4* 3.7 2.9* 3.3* 3.3* 3.2*  CL 100 104 103 103 105 106 107  CO2 25 25 24 23 22 23 23   GLUCOSE 111* 104* 115* 101* 90 97 97  BUN 15 14 16 13 15 11 10   CREATININE 1.53* 1.54* 1.52* 1.27* 1.30* 1.07* 1.00  CALCIUM 8.0* 8.3* 8.3* 8.6* 8.7* 8.4* 8.5*  MG  --  2.0  --   --  1.8  --   --   PHOS 2.6  --   --   --  2.7  --   --     GFR: Estimated Creatinine Clearance: 55.8 mL/min (by C-G formula based on SCr of 1 mg/dL). Liver Function Tests: Recent Labs  Lab 07/22/22 0841 07/23/22 2228  AST  --  295*  ALT  --  121*  ALKPHOS  --  117  BILITOT  --  0.4  PROT  --  5.4*  ALBUMIN 2.7* 2.3*   No results for input(s): "LIPASE", "AMYLASE" in the last 168 hours. No results for input(s): "AMMONIA" in the last 168 hours. Coagulation Profile: Recent Labs  Lab 07/23/22 2118 07/23/22 2228  INR 1.8* 1.3*   Cardiac Enzymes: No results for input(s): "CKTOTAL", "CKMB", "CKMBINDEX", "TROPONINI" in the last 168 hours. BNP (last 3 results) No results for input(s): "PROBNP" in the last 8760 hours. HbA1C: No results for input(s): "HGBA1C" in the last 72 hours. CBG: No results for input(s): "GLUCAP" in the last 168 hours.  Lipid Profile: No results for input(s): "CHOL", "HDL", "LDLCALC", "TRIG", "CHOLHDL", "LDLDIRECT" in the last 72 hours. Thyroid Function Tests: No results for input(s): "TSH", "T4TOTAL", "FREET4", "T3FREE", "THYROIDAB" in the last 72 hours. Anemia Panel: No results for input(s): "VITAMINB12", "FOLATE", "FERRITIN", "TIBC", "IRON", "RETICCTPCT" in the last 72 hours. Sepsis Labs: Recent Labs  Lab 07/23/22 2223 07/23/22 2228 07/24/22 0007 07/25/22 0521  PROCALCITON  --  0.54  --  0.65  LATICACIDVEN 1.5  --  1.4  --     Recent Results (from the past 240 hour(s))  Culture, blood (Routine x 2)     Status: None   Collection Time: 07/18/22  4:52 PM   Specimen: BLOOD  Result Value Ref Range Status   Specimen Description BLOOD RIGHT ANTECUBITAL  Final   Special Requests   Final    BOTTLES DRAWN AEROBIC AND ANAEROBIC Blood Culture results may not be optimal due to an excessive volume of blood received in culture bottles   Culture   Final    NO GROWTH 5 DAYS Performed at [facility], [address], [place], [place] [address]    Report Status 07/23/2022 FINAL  Final  Culture, blood  (Routine x 2)     Status: None   Collection Time: 07/18/22  5:08 PM   Specimen: BLOOD  Result Value Ref Range Status   Specimen Description BLOOD BLOOD RIGHT ARM  Final   Special Requests   Final    BOTTLES DRAWN AEROBIC AND ANAEROBIC Blood Culture adequate volume   Culture   Final    NO GROWTH 5 DAYS Performed at [facility], [address], [place], [place] [address]  Report Status 07/23/2022 FINAL  Final  Urine Culture (for pregnant, neutropenic or urologic patients or patients with an indwelling urinary catheter)     Status: Abnormal   Collection Time: 07/18/22  8:30 PM   Specimen: Urine, Clean Catch  Result Value Ref Range Status   Specimen Description   Final    URINE, CLEAN CATCH Performed at [facility], [address], [place], [place] [address]    Special Requests   Final    NONE Performed at [facility], [address] [place], [place] [address]    Culture >=100,000 COLONIES/mL KLEBSIELLA OXYTOCA (A)  Final   Report Status 07/21/2022 FINAL  Final   Organism ID, Bacteria KLEBSIELLA OXYTOCA (A)  Final      Susceptibility   Klebsiella oxytoca - MIC*    AMPICILLIN >=32 RESISTANT Resistant     CEFEPIME <=0.12 SENSITIVE Sensitive     CEFTRIAXONE <=0.25 SENSITIVE Sensitive     CIPROFLOXACIN <=0.25 SENSITIVE Sensitive     GENTAMICIN <=1 SENSITIVE Sensitive     IMIPENEM <=0.25 SENSITIVE Sensitive     NITROFURANTOIN <=16 SENSITIVE Sensitive     TRIMETH/SULFA <=20 SENSITIVE Sensitive     AMPICILLIN/SULBACTAM 16 INTERMEDIATE Intermediate     PIP/TAZO <=4 SENSITIVE Sensitive     * >=100,000 COLONIES/mL KLEBSIELLA OXYTOCA  Respiratory (~20 pathogens) panel by PCR     Status: None   Collection Time: 07/23/22  9:10 PM   Specimen: Nasopharyngeal Swab; Respiratory  Result Value Ref Range Status   Adenovirus NOT DETECTED NOT DETECTED Final   Coronavirus 229E NOT DETECTED NOT DETECTED Final    Comment: (NOTE) The Coronavirus on the Respiratory  Panel, DOES NOT test for the novel  Coronavirus (2019 nCoV)    Coronavirus HKU1 NOT DETECTED NOT DETECTED Final   Coronavirus NL63 NOT DETECTED NOT DETECTED Final   Coronavirus OC43 NOT DETECTED NOT DETECTED Final   Metapneumovirus NOT DETECTED NOT DETECTED Final   Rhinovirus / Enterovirus NOT DETECTED NOT DETECTED Final   Influenza A NOT DETECTED NOT DETECTED Final   Influenza B NOT DETECTED NOT DETECTED Final   Parainfluenza Virus 1 NOT DETECTED NOT DETECTED Final   Parainfluenza Virus 2 NOT DETECTED NOT DETECTED Final   Parainfluenza Virus 3 NOT DETECTED NOT DETECTED Final   Parainfluenza Virus 4 NOT DETECTED NOT DETECTED Final   Respiratory Syncytial Virus NOT DETECTED NOT DETECTED Final   Bordetella pertussis NOT DETECTED NOT DETECTED Final   Bordetella Parapertussis NOT DETECTED NOT DETECTED Final   Chlamydophila pneumoniae NOT DETECTED NOT DETECTED Final   Mycoplasma pneumoniae NOT DETECTED NOT DETECTED Final    Comment: Performed at [facility], [address], [place], [place] [address]  Culture, blood (x 2)     Status: None   Collection Time: 07/23/22  9:18 PM   Specimen: BLOOD  Result Value Ref Range Status   Specimen Description BLOOD RIGHT ANTECUBITAL  Final   Special Requests   Final    BOTTLES DRAWN AEROBIC AND ANAEROBIC Blood Culture adequate volume   Culture   Final    NO GROWTH 5 DAYS Performed at [facility], [address] [place], [place] [address]    Report Status 07/28/2022 FINAL  Final  Culture, blood (x 2)     Status: None   Collection Time: 07/23/22  9:18 PM   Specimen: BLOOD  Result Value Ref Range Status   Specimen Description BLOOD BLOOD RIGHT HAND  Final   Special Requests  Final    BOTTLES DRAWN AEROBIC AND ANAEROBIC Blood Culture adequate volume   Culture   Final    NO GROWTH 5 DAYS Performed at [facility], [address] [place] [place], [place] [address]    Report Status 07/28/2022 FINAL  Final  MRSA Next Gen by  PCR, Nasal     Status: None   Collection Time: 07/25/22 12:18 PM   Specimen: Nasal Mucosa; Nasal Swab  Result Value Ref Range Status   MRSA by PCR Next Gen NOT DETECTED NOT DETECTED Final    Comment: (NOTE) The GeneXpert MRSA Assay (FDA approved for NASAL specimens only), is one component of a comprehensive MRSA colonization surveillance program. It is not intended to diagnose MRSA infection nor to guide or monitor treatment for MRSA infections. Test performance is not FDA approved in patients less than 54 years old. Performed at [facility], [address], [place], [place] [address]          Radiology Studies: No results found.      Scheduled Meds:  (feeding supplement) PROSource Plus  30 mL Oral TID BM   vitamin C  500 mg Oral BID   aspirin EC  81 mg Oral QHS   enoxaparin (LOVENOX) injection  40 mg Subcutaneous Q24H   exemestane  25 mg Oral QPC breakfast   feeding supplement  237 mL Oral TID BM   folic acid  1 mg Oral Daily   levothyroxine  100 mcg Oral Q0600   metoprolol tartrate  12.5 mg Oral BID   multivitamin with minerals  1 tablet Oral Daily   pantoprazole  20 mg Oral Daily   potassium chloride  40 mEq Oral BID   rosuvastatin  5 mg Oral Daily   sodium chloride flush  3 mL Intravenous Q12H   thiamine  100 mg Oral Daily   traZODone  50 mg Oral QHS   zinc sulfate  220 mg Oral Daily   Continuous Infusions:  ampicillin-sulbactam (UNASYN) IV 1.5 g (07/28/22 0255)   magnesium sulfate bolus IVPB       LOS: 8 days    Time spent: 25 mins    Charise Killian, MD [facility] Pager [phone]  If 7PM-7AM, please contact night-coverage [email or web address] 07/28/2022, 8:34 AM

## 2022-07-29 DIAGNOSIS — R4182 Altered mental status, unspecified: Secondary | ICD-10-CM | POA: Diagnosis not present

## 2022-07-29 DIAGNOSIS — J189 Pneumonia, unspecified organism: Secondary | ICD-10-CM | POA: Diagnosis not present

## 2022-07-29 LAB — BASIC METABOLIC PANEL
Anion gap: 10 (ref 5–15)
BUN: 11 mg/dL (ref 8–23)
CO2: 23 mmol/L (ref 22–32)
Calcium: 8.5 mg/dL — ABNORMAL LOW (ref 8.9–10.3)
Chloride: 106 mmol/L (ref 98–111)
Creatinine, Ser: 0.96 mg/dL (ref 0.44–1.00)
GFR, Estimated: >60 mL/min (ref >60)
Glucose, Bld: 105 mg/dL — ABNORMAL HIGH (ref 70–99)
Potassium: 3.6 mmol/L (ref 3.5–5.1)
Sodium: 139 mmol/L (ref 135–145)

## 2022-07-29 LAB — CBC
HCT: 26 % — ABNORMAL LOW (ref 36.0–46.0)
Hemoglobin: 8.3 g/dL — ABNORMAL LOW (ref 12.0–15.0)
MCH: 26 pg (ref 26.0–34.0)
MCHC: 31.9 g/dL (ref 30.0–36.0)
MCV: 81.5 fL (ref 80.0–100.0)
Platelets: 200 10*3/uL (ref 150–400)
RBC: 3.19 MIL/uL — ABNORMAL LOW (ref 3.87–5.11)
RDW: 17.3 % — ABNORMAL HIGH (ref 11.5–15.5)
WBC: 4.7 10*3/uL (ref 4.0–10.5)
nRBC: 0.4 % — ABNORMAL HIGH (ref 0.0–0.2)

## 2022-07-29 LAB — MAGNESIUM: Magnesium: 2.2 mg/dL (ref 1.7–2.4)

## 2022-07-29 LAB — PHOSPHORUS: Phosphorus: 2.3 mg/dL — ABNORMAL LOW (ref 2.5–4.6)

## 2022-07-29 MED ORDER — SODIUM PHOSPHATES 45 MMOLE/15ML IV SOLN
30.0000 mmol | Freq: Once | INTRAVENOUS | Status: AC
  Administered 2022-07-29: 30 mmol via INTRAVENOUS
  Filled 2022-07-29: qty 10

## 2022-07-29 NOTE — Progress Notes (Addendum)
PROGRESS NOTE    Kathleen Chaney [facility]  ZOX:[id number] DOB: 1950/06/04 DOA: 07/18/2022 PCP: Corky Downs, MD    Assessment & Plan:   Principal Problem:   Altered mental status Active Problems:   AKI (acute kidney injury) (HCC)   Electrolyte abnormality   Generalized weakness   QT prolongation   Elevated troponin   Diarrhea   Malignant neoplasm metastatic to bone (HCC)   Primary hypertension   Sepsis (HCC)   Hypokalemia   Hypophosphatemia   Acute metabolic encephalopathy   Malnutrition of moderate degree   Acute UTI   Pressure injury of skin   Pneumonia   Palliative care encounter  Assessment and Plan: Acute metabolic encephalopathy: mental status has improved today. Continue on IV unasyn. Possibly secondary to infection. D/c cefepime    Sepsis: see [name] notes on how pt mets sepsis criteria. Secondary to right upper lobe pneumonia. Continue on IV abxs. Sepsis resolved    Acute UTI: Urine culture showed Klebsiella oxytoca.  Initial plan was to complete cefdinir on 08/04/2022.  However, she has been restarted on IV antibiotics for sepsis and pneumonia.     AKI on CKDIIIb: Cr is trending down daily. Avoid nephrotoxic meds   Likely ACD: likely secondary to CKD. Will transfuse if Hb < 7.0   Hypokalemia: WNL today    Hypophosphatemia: sodium phosp given    Prolonged Qtc: continue on tele  Elevated troponins: likely secondary to demand ischemia   Chronic diarrhea: imodium prn    Stage IV breast cancer: w/ metastasis to the bone.  Outpatient follow-up with [name], oncologist. No chemo will be given at SNF    Stage II coccygeal decubitus ulcer: present on admission. Continue w/ wound care       DVT prophylaxis: lovenox  Code Status: full Family Communication:  Disposition Plan: PT/OT recs SNF  Level of care: Telemetry Medical Status is: Inpatient Remains inpatient appropriate because: severity of illness    Consultants:    Procedures:    Antimicrobials: unasyn    Subjective: Pt c/o fatigue.   Objective: Vitals:   07/28/22 1612 07/28/22 2100 07/29/22 0411 07/29/22 0500  BP: (!) 90/50 134/62 (!) 116/59   Pulse: 80 93 (!) 103   Resp: 18 19 19    Temp: 98.1 F (36.7 C) 98.2 F (36.8 C) 99.1 F (37.3 C)   TempSrc: Oral Oral Oral   SpO2: 93% 95% 90%   Weight:    88.5 kg  Height:        Intake/Output Summary (Last 24 hours) at 07/29/2022 0802 Last data filed at 07/28/2022 1948 Gross per 24 hour  Intake 727 ml  Output --  Net 727 ml    Filed Weights   07/27/22 0204 07/28/22 0121 07/29/22 0500  Weight: 86.6 kg 87.2 kg 88.5 kg    Examination:  General exam: Appears calm & comfortable  Respiratory system: diminished breath sounds b/l Cardiovascular system: S1/S2+. No rubs or clicks  Gastrointestinal system: abd is soft, NT, obese & normal bowel sounds  Central nervous system: Alert & oriented x3.  Psychiatry: judgement and insight appears improved. Flat mood and affect     Data Reviewed: I have personally reviewed following labs and imaging studies  CBC: Recent Labs  Lab 07/23/22 2228 07/25/22 0521 07/26/22 0333 07/27/22 0337 07/28/22 0618 07/29/22 0327  WBC 3.8* 4.2 3.7* 3.9* 4.6 4.7  NEUTROABS 3.0 3.1  --   --   --   --   HGB 8.5* 9.1* 8.4* 7.6*  7.8* 8.3*  HCT 26.4* 28.4* 25.5* 22.8* 23.3* 26.0*  MCV 82.5 82.6 80.4 80.6 80.1 81.5  PLT 146* 155 157 156 166 200   Basic Metabolic Panel: Recent Labs  Lab 07/22/22 0841 07/23/22 0633 07/23/22 2228 07/25/22 0521 07/26/22 0333 07/27/22 0337 07/28/22 0618 07/29/22 0327  NA 137 137   < > 138 139 136 138 139  K 3.1* 3.4*   < > 2.9* 3.3* 3.3* 3.2* 3.6  CL 100 104   < > 103 105 106 107 106  CO2 25 25   < > 23 22 23 23 23   GLUCOSE 111* 104*   < > 101* 90 97 97 105*  BUN 15 14   < > 13 15 11 10 11   CREATININE 1.53* 1.54*   < > 1.27* 1.30* 1.07* 1.00 0.96  CALCIUM 8.0* 8.3*   < > 8.6* 8.7* 8.4* 8.5* 8.5*  MG  --  2.0  --   --  1.8  --   --   2.2  PHOS 2.6  --   --   --  2.7  --   --  2.3*   < > = values in this interval not displayed.   GFR: Estimated Creatinine Clearance: 58.5 mL/min (by C-G formula based on SCr of 0.96 mg/dL). Liver Function Tests: Recent Labs  Lab 07/22/22 0841 07/23/22 2228  AST  --  295*  ALT  --  121*  ALKPHOS  --  117  BILITOT  --  0.4  PROT  --  5.4*  ALBUMIN 2.7* 2.3*   No results for input(s): "LIPASE", "AMYLASE" in the last 168 hours. No results for input(s): "AMMONIA" in the last 168 hours. Coagulation Profile: Recent Labs  Lab 07/23/22 2118 07/23/22 2228  INR 1.8* 1.3*   Cardiac Enzymes: No results for input(s): "CKTOTAL", "CKMB", "CKMBINDEX", "TROPONINI" in the last 168 hours. BNP (last 3 results) No results for input(s): "PROBNP" in the last 8760 hours. HbA1C: No results for input(s): "HGBA1C" in the last 72 hours. CBG: No results for input(s): "GLUCAP" in the last 168 hours.  Lipid Profile: No results for input(s): "CHOL", "HDL", "LDLCALC", "TRIG", "CHOLHDL", "LDLDIRECT" in the last 72 hours. Thyroid Function Tests: No results for input(s): "TSH", "T4TOTAL", "FREET4", "T3FREE", "THYROIDAB" in the last 72 hours. Anemia Panel: No results for input(s): "VITAMINB12", "FOLATE", "FERRITIN", "TIBC", "IRON", "RETICCTPCT" in the last 72 hours. Sepsis Labs: Recent Labs  Lab 07/23/22 2223 07/23/22 2228 07/24/22 0007 07/25/22 0521  PROCALCITON  --  0.54  --  0.65  LATICACIDVEN 1.5  --  1.4  --     Recent Results (from the past 240 hour(s))  Respiratory (~20 pathogens) panel by PCR     Status: None   Collection Time: 07/23/22  9:10 PM   Specimen: Nasopharyngeal Swab; Respiratory  Result Value Ref Range Status   Adenovirus NOT DETECTED NOT DETECTED Final   Coronavirus 229E NOT DETECTED NOT DETECTED Final    Comment: (NOTE) The Coronavirus on the Respiratory Panel, DOES NOT test for the novel  Coronavirus (2019 nCoV)    Coronavirus HKU1 NOT DETECTED NOT DETECTED Final    Coronavirus NL63 NOT DETECTED NOT DETECTED Final   Coronavirus OC43 NOT DETECTED NOT DETECTED Final   Metapneumovirus NOT DETECTED NOT DETECTED Final   Rhinovirus / Enterovirus NOT DETECTED NOT DETECTED Final   Influenza A NOT DETECTED NOT DETECTED Final   Influenza B NOT DETECTED NOT DETECTED Final   Parainfluenza Virus 1 NOT DETECTED NOT DETECTED  Final   Parainfluenza Virus 2 NOT DETECTED NOT DETECTED Final   Parainfluenza Virus 3 NOT DETECTED NOT DETECTED Final   Parainfluenza Virus 4 NOT DETECTED NOT DETECTED Final   Respiratory Syncytial Virus NOT DETECTED NOT DETECTED Final   Bordetella pertussis NOT DETECTED NOT DETECTED Final   Bordetella Parapertussis NOT DETECTED NOT DETECTED Final   Chlamydophila pneumoniae NOT DETECTED NOT DETECTED Final   Mycoplasma pneumoniae NOT DETECTED NOT DETECTED Final    Comment: Performed at [facility], [address], [place], [place] [address]  Culture, blood (x 2)     Status: None   Collection Time: 07/23/22  9:18 PM   Specimen: BLOOD  Result Value Ref Range Status   Specimen Description BLOOD RIGHT ANTECUBITAL  Final   Special Requests   Final    BOTTLES DRAWN AEROBIC AND ANAEROBIC Blood Culture adequate volume   Culture   Final    NO GROWTH 5 DAYS Performed at [facility], [address] [place], [place] [address]    Report Status 07/28/2022 FINAL  Final  Culture, blood (x 2)     Status: None   Collection Time: 07/23/22  9:18 PM   Specimen: BLOOD  Result Value Ref Range Status   Specimen Description BLOOD BLOOD RIGHT HAND  Final   Special Requests   Final    BOTTLES DRAWN AEROBIC AND ANAEROBIC Blood Culture adequate volume   Culture   Final    NO GROWTH 5 DAYS Performed at [facility], [address], [place], [place] [address]    Report Status 07/28/2022 FINAL  Final  MRSA Next Gen by PCR, Nasal     Status: None   Collection Time: 07/25/22 12:18 PM   Specimen: Nasal Mucosa; Nasal Swab  Result Value  Ref Range Status   MRSA by PCR Next Gen NOT DETECTED NOT DETECTED Final    Comment: (NOTE) The GeneXpert MRSA Assay (FDA approved for NASAL specimens only), is one component of a comprehensive MRSA colonization surveillance program. It is not intended to diagnose MRSA infection nor to guide or monitor treatment for MRSA infections. Test performance is not FDA approved in patients less than 33 years old. Performed at [facility], [address], [place], [place] [address]          Radiology Studies: No results found.      Scheduled Meds:  (feeding supplement) PROSource Plus  30 mL Oral TID BM   vitamin C  500 mg Oral BID   aspirin EC  81 mg Oral QHS   enoxaparin (LOVENOX) injection  40 mg Subcutaneous Q24H   exemestane  25 mg Oral QPC breakfast   feeding supplement  237 mL Oral TID BM   folic acid  1 mg Oral Daily   levothyroxine  100 mcg Oral Q0600   metoprolol tartrate  12.5 mg Oral BID   multivitamin with minerals  1 tablet Oral Daily   pantoprazole  20 mg Oral Daily   rosuvastatin  5 mg Oral Daily   sodium chloride flush  3 mL Intravenous Q12H   thiamine  100 mg Oral Daily   traZODone  50 mg Oral QHS   zinc sulfate  220 mg Oral Daily   Continuous Infusions:  ampicillin-sulbactam (UNASYN) IV 1.5 g (07/29/22 0255)   sodium phosphate 30 mmol in dextrose 5 % 250 mL infusion       LOS: 9 days    Time spent: 25 mins  Charise Killian, MD [facility] Pager [phone]  If 7PM-7AM, please contact night-coverage [email or web address] 07/29/2022, 8:02 AM

## 2022-07-29 NOTE — Plan of Care (Signed)
  Problem: Education: Goal: Knowledge of General Education information will improve Description: Including pain rating scale, medication(s)/side effects and non-pharmacologic comfort measures Outcome: Progressing   Problem: Clinical Measurements: Goal: Diagnostic test results will improve Outcome: Progressing Goal: Cardiovascular complication will be avoided Outcome: Progressing   Problem: Nutrition: Goal: Adequate nutrition will be maintained Outcome: Progressing   Problem: Coping: Goal: Level of anxiety will decrease Outcome: Progressing   

## 2022-07-30 ENCOUNTER — Ambulatory Visit: Payer: Medicare Other | Attending: Radiation Oncology | Admitting: Radiation Oncology

## 2022-07-30 DIAGNOSIS — C7931 Secondary malignant neoplasm of brain: Secondary | ICD-10-CM | POA: Insufficient documentation

## 2022-07-30 DIAGNOSIS — J189 Pneumonia, unspecified organism: Secondary | ICD-10-CM | POA: Diagnosis not present

## 2022-07-30 DIAGNOSIS — C50912 Malignant neoplasm of unspecified site of left female breast: Secondary | ICD-10-CM | POA: Insufficient documentation

## 2022-07-30 DIAGNOSIS — R4182 Altered mental status, unspecified: Secondary | ICD-10-CM | POA: Diagnosis not present

## 2022-07-30 LAB — CBC
HCT: 27.5 % — ABNORMAL LOW (ref 36.0–46.0)
Hemoglobin: 8.8 g/dL — ABNORMAL LOW (ref 12.0–15.0)
MCH: 26.2 pg (ref 26.0–34.0)
MCHC: 32 g/dL (ref 30.0–36.0)
MCV: 81.8 fL (ref 80.0–100.0)
Platelets: 238 10*3/uL (ref 150–400)
RBC: 3.36 MIL/uL — ABNORMAL LOW (ref 3.87–5.11)
RDW: 17.6 % — ABNORMAL HIGH (ref 11.5–15.5)
WBC: 5.2 10*3/uL (ref 4.0–10.5)
nRBC: 0.4 % — ABNORMAL HIGH (ref 0.0–0.2)

## 2022-07-30 LAB — MAGNESIUM: Magnesium: 1.9 mg/dL (ref 1.7–2.4)

## 2022-07-30 LAB — BASIC METABOLIC PANEL
Anion gap: 8 (ref 5–15)
BUN: 8 mg/dL (ref 8–23)
CO2: 26 mmol/L (ref 22–32)
Calcium: 8.5 mg/dL — ABNORMAL LOW (ref 8.9–10.3)
Chloride: 105 mmol/L (ref 98–111)
Creatinine, Ser: 0.88 mg/dL (ref 0.44–1.00)
GFR, Estimated: >60 mL/min (ref >60)
Glucose, Bld: 89 mg/dL (ref 70–99)
Potassium: 3 mmol/L — ABNORMAL LOW (ref 3.5–5.1)
Sodium: 139 mmol/L (ref 135–145)

## 2022-07-30 LAB — PHOSPHORUS: Phosphorus: 4 mg/dL (ref 2.5–4.6)

## 2022-07-30 MED ORDER — POTASSIUM CHLORIDE CRYS ER 20 MEQ PO TBCR
40.0000 meq | EXTENDED_RELEASE_TABLET | Freq: Two times a day (BID) | ORAL | Status: AC
  Administered 2022-07-30 (×2): 40 meq via ORAL
  Filled 2022-07-30 (×2): qty 2

## 2022-07-30 MED ORDER — MAGNESIUM SULFATE 2 GM/50ML IV SOLN
2.0000 g | Freq: Once | INTRAVENOUS | Status: AC
  Administered 2022-07-30: 2 g via INTRAVENOUS
  Filled 2022-07-30: qty 50

## 2022-07-30 NOTE — Care Management Important Message (Signed)
Important Message  Patient Details  Name: Alicia [name] MRN: [id number] Date of Birth: 30-Dec-1950   Medicare Important Message Given:  Yes     Olegario Messier A Francina Beery 07/30/2022, 2:58 PM

## 2022-07-30 NOTE — Plan of Care (Signed)
  Problem: Education: Goal: Knowledge of General Education information will improve Description: Including pain rating scale, medication(s)/side effects and non-pharmacologic comfort measures Outcome: Progressing   Problem: Health Behavior/Discharge Planning: Goal: Ability to manage health-related needs will improve Outcome: Progressing   Problem: Activity: Goal: Risk for activity intolerance will decrease Outcome: Progressing   Problem: Elimination: Goal: Will not experience complications related to urinary retention Outcome: Progressing   Problem: Safety: Goal: Ability to remain free from injury will improve Outcome: Progressing

## 2022-07-30 NOTE — TOC Progression Note (Signed)
Transition of Care [facility]) - Progression Note    Patient Details  Name: Alicia [name] MRN: [id number] Date of Birth: 09-10-1950  Transition of Care [facility] CM/SW Contact  Garret Reddish, RN Phone Number: 07/30/2022, 12:51 PM  Clinical Narrative:   Chart reviewed.  Patient spiked a fever today possibly secondary to UTI and PNA.  Patient is on IV Unasyn.    TOC will continue to follow for discharge planning.           Expected Discharge Plan and Services                                               Social Determinants of Health (SDOH) Interventions SDOH Screenings   Food Insecurity: No Food Insecurity (07/18/2022)  Housing: Low Risk  (07/18/2022)  Transportation Needs: No Transportation Needs (07/18/2022)  Utilities: Not At Risk (07/18/2022)  Alcohol Screen: Low Risk  (03/09/2022)  Depression (PHQ2-9): Low Risk  (03/09/2022)  Financial Resource Strain: High Risk (03/09/2022)  Physical Activity: Insufficiently Active (03/09/2022)  Social Connections: Socially Isolated (03/09/2022)  Stress: No Stress Concern Present (03/09/2022)  Tobacco Use: Medium Risk (07/18/2022)    Readmission Risk Interventions     No data to display

## 2022-07-30 NOTE — Progress Notes (Signed)
Occupational Therapy Treatment Patient Details Name: Alicia [name] MRN: [id number] DOB: 1950/06/12 Today's Date: 07/30/2022   History of present illness Alicia [name] is a 72 y.o. 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  Family said she had not taken any of her medicines for about a week prior to admission.   OT comments  Pt received semi-reclined in bed. Appearing alert; willing to work with OT on transfer to [facility]; pt stating that her incontinence brief is wet. T/f CGA with RW to [facility] next to bed. See flowsheet below for further details of session. Left semi-reclined in bed, RN aware of pt status, bed alarm on, with all needs in reach.     Recommendations for follow up therapy are one component of a multi-disciplinary discharge planning process, led by the attending physician.  Recommendations may be updated based on patient status, additional functional criteria and insurance authorization.    Assistance Recommended at Discharge Frequent or constant Supervision/Assistance  Patient can return home with the following  A lot of help with walking and/or transfers;A lot of help with bathing/dressing/bathroom;Assistance with cooking/housework;Direct supervision/assist for medications management;Assist for transportation;Help with stairs or ramp for entrance   Equipment Recommendations  BSC/3in1    Recommendations for Other Services      Precautions / Restrictions Precautions Precautions: Fall Restrictions Weight Bearing Restrictions: No       Mobility Bed Mobility Overal bed mobility: Needs Assistance Bed Mobility: Supine to Sit, Sit to Supine     Supine to sit: Supervision, HOB elevated Sit to supine: Supervision   General bed mobility comments:  cues and use of bed rails and extra time    Transfers Overall transfer level: Needs assistance Equipment used: Rolling walker (2 wheels) Transfers: Sit to/from Stand Sit to Stand: Min assist           General transfer comment: using RW     Balance Overall balance assessment: Needs assistance Sitting-balance support: Feet supported, Bilateral upper extremity supported Sitting balance-Leahy Scale: Fair     Standing balance support: Bilateral upper extremity supported Standing balance-Leahy Scale: Poor Standing balance comment: external support required                           ADL either performed or assessed with clinical judgement   ADL Overall ADL's : Needs assistance/impaired                     Lower Body Dressing: Moderate assistance;Sit to/from stand Lower Body Dressing Details (indicate cue type and reason): OT assisting pt at EOB to thread brief, then pt able to reach forward to pull it up; CGA while standing to pull up over hips. Toilet Transfer: Chemical engineer (2 wheels) Toilet Transfer Details (indicate cue type and reason): CGA with RW and cues to [facility] next to bed Toileting- Clothing Manipulation and Hygiene: Min guard;Sit to/from stand;Minimal assistance Toileting - Clothing Manipulation Details (indicate cue type and reason): at RW, pt able to stand and perform hygiene for urine with wet wipe. Ot assisting with doffing soiled brief prior to toileting.     Functional mobility during ADLs: Rolling walker (2 wheels);Min guard General ADL Comments: SPT to toilet CGA with cues.    Extremity/Trunk Assessment Upper Extremity Assessment Upper  Extremity Assessment: Generalized weakness   Lower Extremity Assessment Lower Extremity Assessment: Generalized weakness        Vision       Perception     Praxis      Cognition Arousal/Alertness: Awake/alert Behavior During Therapy: Flat affect Overall Cognitive Status:  History of cognitive impairments - at baseline                                 General Comments: Pt oriented to self; did not ask explicit orientation questions. Pt requiring cues for task completion (2 step commands). When helping pt change her gown, OT helped pt to doff soiled gown down to pt's lap, pt donned new gown, then OT pulled soiled gown from pt's lap and pt said "oh, there are two!" as if surprised that there were two gowns.  Later, OT asked pt if she was going to sit or stand while doing toilet hygiene; she stated "sit" and then she stood for hygiene.        Exercises      Shoulder Instructions       General Comments Pt on 2L O2 throughuot session. Pt breathing heavily during ADLs. O2 sat 91%; HR 118.    Pertinent Vitals/ Pain       Pain Assessment Pain Assessment: No/denies pain  Home Living                                          Prior Functioning/Environment              Frequency  Min 1X/week        Progress Toward Goals  OT Goals(current goals can now be found in the care plan section)  Progress towards OT goals: Progressing toward goals  Acute Rehab OT Goals Patient Stated Goal: Get better OT Goal Formulation: With patient Time For Goal Achievement: 08/02/22 Potential to Achieve Goals: Fair ADL Goals Pt Will Perform Grooming: standing;with modified independence Pt Will Transfer to Toilet: with modified independence;bedside commode Pt Will Perform Toileting - Clothing Manipulation and hygiene: sit to/from stand;with min guard assist  Plan Discharge plan remains appropriate    Co-evaluation                 AM-PAC OT "6 Clicks" Daily Activity     Outcome Measure   Help from another person eating meals?: A Little Help from another person taking care of personal grooming?: A Little Help from another person toileting, which includes using toliet, bedpan, or urinal?: A Little Help from another person  bathing (including washing, rinsing, drying)?: A Lot Help from another person to put on and taking off regular upper body clothing?: A Little Help from another person to put on and taking off regular lower body clothing?: A Lot 6 Click Score: 16    End of Session Equipment Utilized During Treatment: Rolling walker (2 wheels);Oxygen;Other (comment) (BSC)  OT Visit Diagnosis: Unsteadiness on feet (R26.81);Muscle weakness (generalized) (M62.81)   Activity Tolerance Patient limited by fatigue   Patient Left in bed;with call bell/phone within reach;with chair alarm set   Nurse Communication Mobility status        Time: 1455-1515 OT Time Calculation (min): 20 min  Charges: OT General Charges $OT Visit: 1 Visit OT Treatments $Self Care/Home Management : 8-22 mins  Laretta Alstrom  Orvan Falconer, MS, OTR/L  Alvester Morin 07/30/2022, 3:42 PM

## 2022-07-30 NOTE — Progress Notes (Signed)
Physical Therapy Treatment Patient Details Name: Alicia [name] MRN: [id number] DOB: [birth year] Today's Date: 07/30/2022   History of Present Illness Alicia [name] is a 72 y.o. 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  Family said she had not taken any of her medicines for about a week prior to admission.    PT Comments  Patient is agreeable to PT. She continues to require physical assistance for bed mobility and transfers. 3 standing bouts performed using rolling walker with limited carry over with safety instructions and hand placement to facilitate independence with mobility. Patient declined ambulation due to feeling mildly dizzy and limited overall standing tolerance. Cues for breathing techniques with mild dyspnea with exertion. Sp02 93% on 2 L02. Recommend to continue PT to maximize independence and decrease caregiver burden.     Assistance Recommended at Discharge Frequent or constant Supervision/Assistance  If plan is discharge home, recommend the following:  Can travel by private vehicle    A little help with walking and/or transfers;A lot of help with bathing/dressing/bathroom;Assistance with cooking/housework;Direct supervision/assist for medications management;Direct supervision/assist for financial management;Assist for transportation;Help with stairs or ramp for entrance   No  Equipment Recommendations   (to be determined at next venue of care)    Recommendations for Other Services       Precautions / Restrictions Precautions Precautions: Fall Restrictions Weight Bearing Restrictions: No     Mobility  Bed Mobility Overal bed mobility: Needs Assistance Bed Mobility: Supine to Sit, Sit to Supine     Supine to sit: Mod assist Sit to  supine: Mod assist   General bed mobility comments: assistance mostly for BLE support. cues for task initiation    Transfers Overall transfer level: Needs assistance Equipment used: Rolling walker (2 wheels) Transfers: Sit to/from Stand Sit to Stand: Mod assist   Step pivot transfers: Mod assist       General transfer comment: 3 standing bouts performed. stand pivot performed to and from bed side commode using rolling walker. limited carry over of instruction for hand placement with standing with cues required each time. lifting and lowering assistance provided.    Ambulation/Gait         Gait velocity: decreased   Pre-gait activities: attempts to increase standing tolerance in preparation for ambulation General Gait Details: patient declined as she reports feeling mildly dizzy while standing   Stairs             Wheelchair Mobility     Tilt Bed    Modified Rankin (Stroke Patients Only)       Balance Overall balance assessment: Needs assistance Sitting-balance support: Feet supported, Bilateral upper extremity supported Sitting balance-Leahy Scale: Fair     Standing balance support: Bilateral upper extremity supported Standing balance-Leahy Scale: Poor Standing balance comment: external support required                            Cognition Arousal/Alertness: Awake/alert Behavior During Therapy: Flat affect Overall Cognitive Status: History of cognitive impairments - at baseline                                 General Comments: Patient is oriented to the day of the week.  She is able to follow single step commands with increased time and occasional multi modal cues        Exercises      General Comments General comments (skin integrity, edema, etc.): patient required maximal assistance for don/doffing brief and for pericare following urination in bed side commode      Pertinent Vitals/Pain Pain Assessment Pain  Assessment: No/denies pain    Home Living                          Prior Function            PT Goals (current goals can now be found in the care plan section) Acute Rehab PT Goals Patient Stated Goal: to get to rehab PT Goal Formulation: With patient Time For Goal Achievement: 08/01/22 Potential to Achieve Goals: Fair Progress towards PT goals: Progressing toward goals    Frequency    Min 1X/week      PT Plan Current plan remains appropriate    Co-evaluation              AM-PAC PT "6 Clicks" Mobility   Outcome Measure  Help needed turning from your back to your side while in a flat bed without using bedrails?: A Little Help needed moving from lying on your back to sitting on the side of a flat bed without using bedrails?: A Lot Help needed moving to and from a bed to a chair (including a wheelchair)?: A Little Help needed standing up from a chair using your arms (e.g., wheelchair or bedside chair)?: A Lot Help needed to walk in hospital room?: A Little Help needed climbing 3-5 steps with a railing? : A Lot 6 Click Score: 15    End of Session Equipment Utilized During Treatment: Gait belt Activity Tolerance: Patient limited by fatigue Patient left: in bed;with call bell/phone within reach;with bed alarm set Nurse Communication: Mobility status PT Visit Diagnosis: Muscle weakness (generalized) (M62.81);Difficulty in walking, not elsewhere classified (R26.2)     Time: 1610-9604 PT Time Calculation (min) (ACUTE ONLY): 25 min  Charges:    $Therapeutic Activity: 23-37 mins PT General Charges $$ ACUTE PT VISIT: 1 Visit                     Donna Bernard, PT, MPT    Ina Homes 07/30/2022, 12:52 PM

## 2022-07-30 NOTE — Progress Notes (Signed)
PROGRESS NOTE    Alicia [name] [facility]  ZOX:[id number] DOB: 16-Apr-1950 DOA: 07/18/2022 PCP: Corky Downs, MD    Assessment & Plan:   Principal Problem:   Altered mental status Active Problems:   AKI (acute kidney injury) (HCC)   Electrolyte abnormality   Generalized weakness   QT prolongation   Elevated troponin   Diarrhea   Malignant neoplasm metastatic to bone (HCC)   Primary hypertension   Sepsis (HCC)   Hypokalemia   Hypophosphatemia   Acute metabolic encephalopathy   Malnutrition of moderate degree   Acute UTI   Pressure injury of skin   Pneumonia   Palliative care encounter  Assessment and Plan: Acute metabolic encephalopathy: mental status has improved. Spiked a fever today. Continue on IV unasyn. Possibly secondary to an infection. D/c cefepime    Sepsis: see [name] notes on how pt mets sepsis criteria. Secondary to right upper lobe pneumonia. Continue on IV abxs. Sepsis resolved    Acute UTI: Urine culture showed Klebsiella oxytoca.  Initial plan was to complete cefdinir on 08/04/2022.  However, she has been restarted on IV antibiotics for sepsis and pneumonia.     AKI on CKDIIIb: Cr is better than baseline. AKI has resolved   Likely ACD: likely secondary to CKD. No need for transfusion currently   Hypokalemia: potassium given    Hypophosphatemia: WNL today    Prolonged Qtc: continue on tele  Elevated troponins: likely secondary to demand ischemia   Chronic diarrhea: imodium prn    Stage IV breast cancer: w/ metastasis to the bone.  Outpatient follow-up with [name], oncologist. No chemo will be given at SNF    Stage II coccygeal decubitus ulcer: present on admission. Continue w/ wound care       DVT prophylaxis: lovenox  Code Status: full Family Communication:  Disposition Plan: PT/OT recs SNF  Level of care: Telemetry Medical Status is: Inpatient Remains inpatient appropriate because: severity of illness, spiked a fever today      Consultants:    Procedures:   Antimicrobials: unasyn    Subjective: Pt c/o malaise   Objective: Vitals:   07/29/22 0807 07/29/22 1601 07/29/22 2256 07/30/22 0452  BP: 127/64 (!) 107/49 (!) 106/55 110/62  Pulse: (!) 107 89 78 75  Resp: 16 18 20 18   Temp: 99.3 F (37.4 C) 98.8 F (37.1 C) 97.9 F (36.6 C) 97.8 F (36.6 C)  TempSrc:   Oral Oral  SpO2: (!) 89% 94% 96% 95%  Weight:      Height:       No intake or output data in the 24 hours ending 07/30/22 0804   Filed Weights   07/27/22 0204 07/28/22 0121 07/29/22 0500  Weight: 86.6 kg 87.2 kg 88.5 kg    Examination:  General exam: Appears comfortable. Obese Respiratory system: decreased breath sounds b/l  Cardiovascular system: S1 & S2+. No rubs or clicks  Gastrointestinal system: abd is soft, NT, obese & normal bowel sounds  Central nervous system: alert & oriented x 3 Psychiatry: judgement and insight appears improved. Flat mood and affect     Data Reviewed: I have personally reviewed following labs and imaging studies  CBC: Recent Labs  Lab 07/23/22 2228 07/25/22 0521 07/26/22 0333 07/27/22 0337 07/28/22 0618 07/29/22 0327 07/30/22 0335  WBC 3.8* 4.2 3.7* 3.9* 4.6 4.7 5.2  NEUTROABS 3.0 3.1  --   --   --   --   --   HGB 8.5* 9.1* 8.4* 7.6* 7.8* 8.3*  8.8*  HCT 26.4* 28.4* 25.5* 22.8* 23.3* 26.0* 27.5*  MCV 82.5 82.6 80.4 80.6 80.1 81.5 81.8  PLT 146* 155 157 156 166 200 238   Basic Metabolic Panel: Recent Labs  Lab 07/26/22 0333 07/27/22 0337 07/28/22 0618 07/29/22 0327 07/30/22 0335  NA 139 136 138 139 139  K 3.3* 3.3* 3.2* 3.6 3.0*  CL 105 106 107 106 105  CO2 22 23 23 23 26   GLUCOSE 90 97 97 105* 89  BUN 15 11 10 11 8   CREATININE 1.30* 1.07* 1.00 0.96 0.88  CALCIUM 8.7* 8.4* 8.5* 8.5* 8.5*  MG 1.8  --   --  2.2 1.9  PHOS 2.7  --   --  2.3* 4.0   GFR: Estimated Creatinine Clearance: 63.8 mL/min (by C-G formula based on SCr of 0.88 mg/dL). Liver Function Tests: Recent Labs   Lab 07/23/22 2228  AST 295*  ALT 121*  ALKPHOS 117  BILITOT 0.4  PROT 5.4*  ALBUMIN 2.3*   No results for input(s): "LIPASE", "AMYLASE" in the last 168 hours. No results for input(s): "AMMONIA" in the last 168 hours. Coagulation Profile: Recent Labs  Lab 07/23/22 2118 07/23/22 2228  INR 1.8* 1.3*   Cardiac Enzymes: No results for input(s): "CKTOTAL", "CKMB", "CKMBINDEX", "TROPONINI" in the last 168 hours. BNP (last 3 results) No results for input(s): "PROBNP" in the last 8760 hours. HbA1C: No results for input(s): "HGBA1C" in the last 72 hours. CBG: No results for input(s): "GLUCAP" in the last 168 hours.  Lipid Profile: No results for input(s): "CHOL", "HDL", "LDLCALC", "TRIG", "CHOLHDL", "LDLDIRECT" in the last 72 hours. Thyroid Function Tests: No results for input(s): "TSH", "T4TOTAL", "FREET4", "T3FREE", "THYROIDAB" in the last 72 hours. Anemia Panel: No results for input(s): "VITAMINB12", "FOLATE", "FERRITIN", "TIBC", "IRON", "RETICCTPCT" in the last 72 hours. Sepsis Labs: Recent Labs  Lab 07/23/22 2223 07/23/22 2228 07/24/22 0007 07/25/22 0521  PROCALCITON  --  0.54  --  0.65  LATICACIDVEN 1.5  --  1.4  --     Recent Results (from the past 240 hour(s))  Respiratory (~20 pathogens) panel by PCR     Status: None   Collection Time: 07/23/22  9:10 PM   Specimen: Nasopharyngeal Swab; Respiratory  Result Value Ref Range Status   Adenovirus NOT DETECTED NOT DETECTED Final   Coronavirus 229E NOT DETECTED NOT DETECTED Final    Comment: (NOTE) The Coronavirus on the Respiratory Panel, DOES NOT test for the novel  Coronavirus (2019 nCoV)    Coronavirus HKU1 NOT DETECTED NOT DETECTED Final   Coronavirus NL63 NOT DETECTED NOT DETECTED Final   Coronavirus OC43 NOT DETECTED NOT DETECTED Final   Metapneumovirus NOT DETECTED NOT DETECTED Final   Rhinovirus / Enterovirus NOT DETECTED NOT DETECTED Final   Influenza A NOT DETECTED NOT DETECTED Final   Influenza B NOT  DETECTED NOT DETECTED Final   Parainfluenza Virus 1 NOT DETECTED NOT DETECTED Final   Parainfluenza Virus 2 NOT DETECTED NOT DETECTED Final   Parainfluenza Virus 3 NOT DETECTED NOT DETECTED Final   Parainfluenza Virus 4 NOT DETECTED NOT DETECTED Final   Respiratory Syncytial Virus NOT DETECTED NOT DETECTED Final   Bordetella pertussis NOT DETECTED NOT DETECTED Final   Bordetella Parapertussis NOT DETECTED NOT DETECTED Final   Chlamydophila pneumoniae NOT DETECTED NOT DETECTED Final   Mycoplasma pneumoniae NOT DETECTED NOT DETECTED Final    Comment: Performed at [facility], [address], [place], [place] [address]  Culture, blood (x 2)  Status: None   Collection Time: 07/23/22  9:18 PM   Specimen: BLOOD  Result Value Ref Range Status   Specimen Description BLOOD RIGHT ANTECUBITAL  Final   Special Requests   Final    BOTTLES DRAWN AEROBIC AND ANAEROBIC Blood Culture adequate volume   Culture   Final    NO GROWTH 5 DAYS Performed at [facility], [address], [place], [place] [address]    Report Status 07/28/2022 FINAL  Final  Culture, blood (x 2)     Status: None   Collection Time: 07/23/22  9:18 PM   Specimen: BLOOD  Result Value Ref Range Status   Specimen Description BLOOD BLOOD RIGHT HAND  Final   Special Requests   Final    BOTTLES DRAWN AEROBIC AND ANAEROBIC Blood Culture adequate volume   Culture   Final    NO GROWTH 5 DAYS Performed at [facility], [address], [place], [place] [address]    Report Status 07/28/2022 FINAL  Final  MRSA Next Gen by PCR, Nasal     Status: None   Collection Time: 07/25/22 12:18 PM   Specimen: Nasal Mucosa; Nasal Swab  Result Value Ref Range Status   MRSA by PCR Next Gen NOT DETECTED NOT DETECTED Final    Comment: (NOTE) The GeneXpert MRSA Assay (FDA approved for NASAL specimens only), is one component of a comprehensive MRSA colonization surveillance program. It is not intended to diagnose MRSA  infection nor to guide or monitor treatment for MRSA infections. Test performance is not FDA approved in patients less than 48 years old. Performed at [facility], [address] [place], [place] [address]          Radiology Studies: No results found.      Scheduled Meds:  (feeding supplement) PROSource Plus  30 mL Oral TID BM   vitamin C  500 mg Oral BID   aspirin EC  81 mg Oral QHS   enoxaparin (LOVENOX) injection  40 mg Subcutaneous Q24H   exemestane  25 mg Oral QPC breakfast   feeding supplement  237 mL Oral TID BM   folic acid  1 mg Oral Daily   levothyroxine  100 mcg Oral Q0600   metoprolol tartrate  12.5 mg Oral BID   multivitamin with minerals  1 tablet Oral Daily   pantoprazole  20 mg Oral Daily   potassium chloride  40 mEq Oral BID   rosuvastatin  5 mg Oral Daily   sodium chloride flush  3 mL Intravenous Q12H   thiamine  100 mg Oral Daily   traZODone  50 mg Oral QHS   zinc sulfate  220 mg Oral Daily   Continuous Infusions:  ampicillin-sulbactam (UNASYN) IV 1.5 g (07/30/22 0235)   magnesium sulfate bolus IVPB       LOS: 10 days    Time spent: 25 mins    Charise Killian, MD [facility] Pager [phone]  If 7PM-7AM, please contact night-coverage [email or web address] 07/30/2022, 8:04 AM

## 2022-07-31 DIAGNOSIS — J189 Pneumonia, unspecified organism: Secondary | ICD-10-CM | POA: Diagnosis not present

## 2022-07-31 DIAGNOSIS — R4182 Altered mental status, unspecified: Secondary | ICD-10-CM | POA: Diagnosis not present

## 2022-07-31 LAB — CBC
HCT: 23.8 % — ABNORMAL LOW (ref 36.0–46.0)
Hemoglobin: 7.7 g/dL — ABNORMAL LOW (ref 12.0–15.0)
MCH: 25.8 pg — ABNORMAL LOW (ref 26.0–34.0)
MCHC: 32.4 g/dL (ref 30.0–36.0)
MCV: 79.6 fL — ABNORMAL LOW (ref 80.0–100.0)
Platelets: 250 10*3/uL (ref 150–400)
RBC: 2.99 MIL/uL — ABNORMAL LOW (ref 3.87–5.11)
RDW: 17.6 % — ABNORMAL HIGH (ref 11.5–15.5)
WBC: 4.7 10*3/uL (ref 4.0–10.5)
nRBC: 0 % (ref 0.0–0.2)

## 2022-07-31 LAB — BASIC METABOLIC PANEL
Anion gap: 11 (ref 5–15)
BUN: 7 mg/dL — ABNORMAL LOW (ref 8–23)
CO2: 24 mmol/L (ref 22–32)
Calcium: 8.4 mg/dL — ABNORMAL LOW (ref 8.9–10.3)
Chloride: 105 mmol/L (ref 98–111)
Creatinine, Ser: 0.86 mg/dL (ref 0.44–1.00)
GFR, Estimated: >60 mL/min (ref >60)
Glucose, Bld: 91 mg/dL (ref 70–99)
Potassium: 3.4 mmol/L — ABNORMAL LOW (ref 3.5–5.1)
Sodium: 140 mmol/L (ref 135–145)

## 2022-07-31 LAB — MAGNESIUM: Magnesium: 2.1 mg/dL (ref 1.7–2.4)

## 2022-07-31 LAB — PHOSPHORUS: Phosphorus: 2.6 mg/dL (ref 2.5–4.6)

## 2022-07-31 MED ORDER — POTASSIUM CHLORIDE CRYS ER 20 MEQ PO TBCR
40.0000 meq | EXTENDED_RELEASE_TABLET | Freq: Once | ORAL | Status: AC
  Administered 2022-07-31: 40 meq via ORAL
  Filled 2022-07-31: qty 2

## 2022-07-31 NOTE — TOC Progression Note (Signed)
Transition of Care [facility]) - Progression Note    Patient Details  Name: Alicia [name] MRN: [id number] Date of Birth: 17-Oct-1950  [facility] of [facility]) CM/SW Contact  Garret Reddish, RN Phone Number: 07/31/2022, 1:38 PM  Clinical Narrative:  Chart reviewed.  Provider informs me that patient is able medical stable for discharge to facility.    I have spoken with Tammy, Admission Coordinator with [facility].  Tammy reports that she can accept patient to SNF on tomorrow.  Patient has [employer] and will not require SNF authorization.          Expected Discharge Plan and Services                                               Social Determinants of Health (SDOH) Interventions SDOH Screenings   Food Insecurity: No Food Insecurity (07/18/2022)  Housing: Low Risk  (07/18/2022)  Transportation Needs: No Transportation Needs (07/18/2022)  Utilities: Not At Risk (07/18/2022)  Alcohol Screen: Low Risk  (03/09/2022)  Depression (PHQ2-9): Low Risk  (03/09/2022)  Financial Resource Strain: High Risk (03/09/2022)  Physical Activity: Insufficiently Active (03/09/2022)  Social Connections: Socially Isolated (03/09/2022)  Stress: No Stress Concern Present (03/09/2022)  Tobacco Use: Medium Risk (07/18/2022)    Readmission Risk Interventions     No data to display

## 2022-07-31 NOTE — Progress Notes (Signed)
Occupational Therapy Treatment Patient Details Name: GENEIEVE DUELL MRN: [id number] DOB: 1950-07-27 Today's Date: 07/31/2022   History of present illness KRYSTYN PICKING is a 72 y.o. 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  Family said she had not taken any of her medicines for about a week prior to admission.   OT comments  Pt seen for OT treatment on this date. Upon arrival to room pt seated in recliner, agreeable to tx. Pt requires MIN A with RW in place to step transfer bed<>chair x2. Pt expressed interest in cushioning chair to increase comfort, intervention was effective. MIN A for oral care while seated to facilitate completion of task. Pt making progress toward goals, will continue to follow POC. Discharge recommendation remains appropriate.     Recommendations for follow up therapy are one component of a multi-disciplinary discharge planning process, led by the attending physician.  Recommendations may be updated based on patient status, additional functional criteria and insurance authorization.    Assistance Recommended at Discharge Frequent or constant Supervision/Assistance  Patient can return home with the following  A lot of help with walking and/or transfers;A lot of help with bathing/dressing/bathroom;Assistance with cooking/housework;Direct supervision/assist for medications management;Assist for transportation;Help with stairs or ramp for entrance   Equipment Recommendations  BSC/3in1    Recommendations for Other Services      Precautions / Restrictions Precautions Precautions: Fall Restrictions Weight Bearing Restrictions: No       Mobility Bed Mobility                    Transfers Overall transfer level: Needs  assistance Equipment used: Rolling walker (2 wheels) Transfers: Bed to chair/wheelchair/BSC Sit to Stand: Min assist     Step pivot transfers: Min assist     General transfer comment: using RW     Balance Overall balance assessment: Needs assistance Sitting-balance support: Feet supported, Bilateral upper extremity supported Sitting balance-Leahy Scale: Fair     Standing balance support: Bilateral upper extremity supported Standing balance-Leahy Scale: Poor Standing balance comment: external support required                           ADL either performed or assessed with clinical judgement   ADL Overall ADL's : Needs assistance/impaired     Grooming: Oral care;Minimal assistance;Sitting                               Functional mobility during ADLs: Rolling walker (2 wheels);Min guard      Extremity/Trunk Assessment Upper Extremity Assessment Upper Extremity Assessment: Generalized weakness   Lower Extremity Assessment Lower Extremity Assessment: Generalized weakness        Vision       Perception     Praxis      Cognition Arousal/Alertness: Awake/alert Behavior During Therapy: Flat affect Overall Cognitive Status: History of cognitive impairments - at baseline                                          Exercises      Shoulder Instructions       General Comments  Pertinent Vitals/ Pain       Pain Assessment Pain Assessment: Faces Faces Pain Scale: Hurts a little bit Pain Location: pelvis Pain Descriptors / Indicators: Discomfort Pain Intervention(s): Monitored during session, Repositioned  Home Living                                          Prior Functioning/Environment              Frequency  Min 1X/week        Progress Toward Goals  OT Goals(current goals can now be found in the care plan section)  Progress towards OT goals: Progressing toward goals  Acute  Rehab OT Goals Patient Stated Goal: To feel better OT Goal Formulation: With patient Time For Goal Achievement: 08/02/22 Potential to Achieve Goals: Fair ADL Goals Pt Will Perform Grooming: standing;with modified independence Pt Will Transfer to Toilet: with modified independence;bedside commode Pt Will Perform Toileting - Clothing Manipulation and hygiene: sit to/from stand;with min guard assist  Plan Discharge plan remains appropriate    Co-evaluation                 AM-PAC OT "6 Clicks" Daily Activity     Outcome Measure   Help from another person eating meals?: A Little Help from another person taking care of personal grooming?: A Little Help from another person toileting, which includes using toliet, bedpan, or urinal?: A Little Help from another person bathing (including washing, rinsing, drying)?: A Lot Help from another person to put on and taking off regular upper body clothing?: A Little Help from another person to put on and taking off regular lower body clothing?: A Lot 6 Click Score: 16    End of Session Equipment Utilized During Treatment: Rolling walker (2 wheels);Oxygen;Other (comment)  OT Visit Diagnosis: Unsteadiness on feet (R26.81);Muscle weakness (generalized) (M62.81)   Activity Tolerance Patient limited by fatigue   Patient Left with call bell/phone within reach;with chair alarm set;in chair   Nurse Communication Other (comment) (SpO2 desat with increased activity)        Time: 1610-9604 OT Time Calculation (min): 19 min  Charges: OT General Charges $OT Visit: 1 Visit OT Treatments $Self Care/Home Management : 8-22 mins  Thresa Ross, OTS

## 2022-07-31 NOTE — Progress Notes (Signed)
Physical Therapy Treatment Patient Details Name: Alicia [name] MRN: [id number] DOB: 01-26-1950 Today's Date: 07/31/2022   History of Present Illness Alicia [name] is a 72 y.o. 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  Family said she had not taken any of her medicines for about a week prior to admission.    PT Comments  Patient is agreeable to PT. Patient is alert and cooperative throughout session. Two bouts of walking performed with rolling walker, steadying assistance provided and seated rest break between walking bouts. The patient is fatigued with activity with heart rate up to 120bpm, Sp02 92% on 2 L02 after walking. Recommend to continue PT to maximize independence and facilitate return to prior level of function.     Assistance Recommended at Discharge Frequent or constant Supervision/Assistance  If plan is discharge home, recommend the following:  Can travel by private vehicle    A little help with walking and/or transfers;A lot of help with bathing/dressing/bathroom;Assistance with cooking/housework;Direct supervision/assist for medications management;Direct supervision/assist for financial management;Assist for transportation;Help with stairs or ramp for entrance   No  Equipment Recommendations   (to be determined at next level of care)    Recommendations for Other Services       Precautions / Restrictions Precautions Precautions: Fall Restrictions Weight Bearing Restrictions: No     Mobility  Bed Mobility Overal bed mobility: Needs Assistance Bed Mobility: Supine to Sit, Sit to Supine     Supine to sit: HOB elevated, Min assist     General bed mobility comments: cues for task initiation and sequencing    Transfers Overall  transfer level: Needs assistance Equipment used: Rolling walker (2 wheels) Transfers: Sit to/from Stand, Bed to chair/wheelchair/BSC Sit to Stand: Min assist   Step pivot transfers: Min assist       General transfer comment: using rolling walker. cues for hand placement for safety. several standing bouts performed    Ambulation/Gait Ambulation/Gait assistance: Min assist Gait Distance (Feet): 14 Feet (x 2) Assistive device: Rolling walker (2 wheels) Gait Pattern/deviations: Step-to pattern, Trunk flexed Gait velocity: decreased     General Gait Details: 2 walking bouts performed with short rest break required between walking bouts. heart rate 120 after walking with Sp02 92% on 2 L02. minimal assistance for steadying and cues for rolling walker negotiation   Stairs             Wheelchair Mobility     Tilt Bed    Modified Rankin (Stroke Patients Only)       Balance Overall balance assessment: Needs assistance Sitting-balance support: Feet supported, Bilateral upper extremity supported Sitting balance-Leahy Scale: Fair     Standing balance support: Bilateral upper extremity supported Standing balance-Leahy Scale: Poor Standing balance comment: poor to fair using rolling walker for support                            Cognition Arousal/Alertness: Awake/alert Behavior During Therapy: Flat affect Overall Cognitive Status: History of cognitive impairments - at baseline                                 General Comments: patient is able to follow single step commands with increased time  Exercises      General Comments General comments (skin integrity, edema, etc.): patient feels fatigued after activity. she requested to stay sitting up in the chair to eat lunch      Pertinent Vitals/Pain Pain Assessment Pain Assessment: No/denies pain    Home Living                          Prior Function            PT  Goals (current goals can now be found in the care plan section) Acute Rehab PT Goals Patient Stated Goal: to get to rehab PT Goal Formulation: With patient Time For Goal Achievement: 08/14/22 Potential to Achieve Goals: Fair Progress towards PT goals: Progressing toward goals    Frequency    Min 1X/week      PT Plan Current plan remains appropriate    Co-evaluation              AM-PAC PT "6 Clicks" Mobility   Outcome Measure  Help needed turning from your back to your side while in a flat bed without using bedrails?: A Little Help needed moving from lying on your back to sitting on the side of a flat bed without using bedrails?: A Lot Help needed moving to and from a bed to a chair (including a wheelchair)?: A Little Help needed standing up from a chair using your arms (e.g., wheelchair or bedside chair)?: A Lot Help needed to walk in hospital room?: A Little Help needed climbing 3-5 steps with a railing? : A Lot 6 Click Score: 15    End of Session Equipment Utilized During Treatment: Gait belt;Oxygen Activity Tolerance: Patient limited by fatigue Patient left: in chair;with call bell/phone within reach Nurse Communication: Mobility status PT Visit Diagnosis: Muscle weakness (generalized) (M62.81);Difficulty in walking, not elsewhere classified (R26.2)     Time: 4098-1191 PT Time Calculation (min) (ACUTE ONLY): 33 min  Charges:    $Gait Training: 8-22 mins $Therapeutic Activity: 8-22 mins PT General Charges $$ ACUTE PT VISIT: 1 Visit                     Donna Bernard, PT, MPT    Ina Homes 07/31/2022, 3:10 PM

## 2022-07-31 NOTE — Progress Notes (Addendum)
PROGRESS NOTE   HPI was taken from Dr. Huel Cote:  Alicia [name] is a 72 y.o. 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   History obtained from patient and Alicia [name].  Alicia [name] states that for the last couple days, she has been feeling confused and disoriented, describing it as not understanding what is going on around Alicia.  She endorses chronic diarrhea that has been persistent but denies any melena or hematochezia.  She endorses poor p.o. intake but denies any nausea, vomiting.  Alicia [name] at bedside states that on 6/30, he checked on Alicia and she seemed at Alicia baseline.  Since then, she has been rapidly declining.  She has been so weak that she has been unable to get up and use the restroom on Alicia own.  No focal weakness.  No reported fevers or abdominal pain.  At this time, Alicia [name] denies any shortness of breath, chest pain or palpitations.   Patient's [name] feels that patient symptoms have gotten worse since she was taken off Alicia bone injection.  He notes increased pain.  Per chart review, [name] was discontinued by Oncology due to mandible concerns.    ED course: On arrival to the ED, patient was normotensive at 111/69 with heart rate of 102.  Heart rate improved to 68.  She was saturating at 93% on room air.  While sleeping, patient desaturated to 80% and was placed on supplemental oxygen.  She was afebrile 97.7.  Initial workup demonstrated sodium of 127, potassium 2.2, glucose 147, creatinine 2.112, BUN 18, anion gap 18, and GFR 24.  Lactic acid 2.7.  CBC with hemoglobin of 11.2 with normal WBC of 6.4. Chest x-ray was obtained with no acute cardiopulmonary disease.  Patient was started on potassium, and IV fluids.  TRH contacted for admission.   As per Dr. Mayford Knife 7/10-7/16/24: Pt was restarted  on IV abxs for likely pneumonia as per Dr. Myriam Forehand. IV cefepime and IV vanco were d/c and pt was started on IV unasyn. Pt's mental status waxes and wanes but has improved overall today. PT/OT recs SNF and SNF can accept the pt tomorrow.     Alicia [name]  AOZ:[id number] DOB: 04/17/1950 DOA: 07/18/2022 PCP: Corky Downs, MD    Assessment & Plan:   Principal Problem:   Altered mental status Active Problems:   AKI (acute kidney injury) (HCC)   Electrolyte abnormality   Generalized weakness   QT prolongation   Elevated troponin   Diarrhea   Malignant neoplasm metastatic to bone (HCC)   Primary hypertension   Sepsis (HCC)   Hypokalemia   Hypophosphatemia   Acute metabolic encephalopathy   Malnutrition of moderate degree   Acute UTI   Pressure injury of skin   Pneumonia   Palliative care encounter  Assessment and Plan: Acute metabolic encephalopathy: mental status has improved. Spiked a fever today. Continue on IV unasyn. Possibly secondary to an infection. D/c cefepime    Sepsis: see [name] notes on how pt mets sepsis criteria. Secondary to right upper lobe pneumonia. Continue on IV abxs. Sepsis resolved    Acute UTI: Urine culture showed Klebsiella oxytoca.  Initial plan was to complete cefdinir on 08/04/2022.  However, she has been restarted on IV antibiotics for sepsis and pneumonia.     AKI on CKDIIIb: Cr is better  than baseline. AKI has resolved   Likely ACD: likely secondary to CKD. Will transfuse if Hb < 7.0   Hypokalemia: KCl repleated.    Hypophosphatemia: WNL today    Prolonged Qtc: continue on tele  Elevated troponins: likely secondary to demand ischemia   Chronic diarrhea: imodium prn     Stage IV breast cancer: w/ metastasis to the bone.  Outpatient follow-up with [name], oncologist. No chemo will be given at SNF    Stage II coccygeal decubitus ulcer: present on admission. Continue w/ wound care       DVT prophylaxis: lovenox  Code Status:  full Family Communication:  Disposition Plan: PT/OT recs SNF  Level of care: Telemetry Medical Status is: Inpatient Remains inpatient appropriate because: SNF can accept the pt tomorrow     Consultants:    Procedures:   Antimicrobials: unasyn    Subjective: Pt c/o buttocks pain   Objective: Vitals:   07/30/22 1611 07/30/22 2111 07/31/22 0302 07/31/22 0621  BP: (!) 106/47 120/66  (!) 140/65  Pulse: 88 99  (!) 102  Resp: 14 17  17   Temp: 98.3 F (36.8 C) 98.9 F (37.2 C)  99.5 F (37.5 C)  TempSrc: Oral Oral  Oral  SpO2: 94% 92%  92%  Weight:   86.1 kg   Height:       No intake or output data in the 24 hours ending 07/31/22 0754   Filed Weights   07/28/22 0121 07/29/22 0500 07/31/22 0302  Weight: 87.2 kg 88.5 kg 86.1 kg    Examination:  General exam: Appears calm & comfortable. Obese Respiratory system: diminished breath sounds b/l  Cardiovascular system: S1/S2+. No rubs or clicks  Gastrointestinal system: abd is soft, NT, obese & normal bowel sounds  Central nervous system: alert & oriented x 3  Psychiatry: judgement and insight appears improved slightly. Flat mood and affect     Data Reviewed: I have personally reviewed following labs and imaging studies  CBC: Recent Labs  Lab 07/25/22 0521 07/26/22 0333 07/27/22 0337 07/28/22 0618 07/29/22 0327 07/30/22 0335 07/31/22 0356  WBC 4.2   < > 3.9* 4.6 4.7 5.2 4.7  NEUTROABS 3.1  --   --   --   --   --   --   HGB 9.1*   < > 7.6* 7.8* 8.3* 8.8* 7.7*  HCT 28.4*   < > 22.8* 23.3* 26.0* 27.5* 23.8*  MCV 82.6   < > 80.6 80.1 81.5 81.8 79.6*  PLT 155   < > 156 166 200 238 250   < > = values in this interval not displayed.   Basic Metabolic Panel: Recent Labs  Lab 07/26/22 0333 07/27/22 0337 07/28/22 0618 07/29/22 0327 07/30/22 0335 07/31/22 0356  NA 139 136 138 139 139 140  K 3.3* 3.3* 3.2* 3.6 3.0* 3.4*  CL 105 106 107 106 105 105  CO2 22 23 23 23 26 24   GLUCOSE 90 97 97 105* 89 91  BUN 15  11 10 11 8  7*  CREATININE 1.30* 1.07* 1.00 0.96 0.88 0.86  CALCIUM 8.7* 8.4* 8.5* 8.5* 8.5* 8.4*  MG 1.8  --   --  2.2 1.9 2.1  PHOS 2.7  --   --  2.3* 4.0 2.6   GFR: Estimated Creatinine Clearance: 64.4 mL/min (by C-G formula based on SCr of 0.86 mg/dL). Liver Function Tests: No results for input(s): "AST", "ALT", "ALKPHOS", "BILITOT", "PROT", "ALBUMIN" in the last 168 hours.  No results for  input(s): "LIPASE", "AMYLASE" in the last 168 hours. No results for input(s): "AMMONIA" in the last 168 hours. Coagulation Profile: No results for input(s): "INR", "PROTIME" in the last 168 hours.  Cardiac Enzymes: No results for input(s): "CKTOTAL", "CKMB", "CKMBINDEX", "TROPONINI" in the last 168 hours. BNP (last 3 results) No results for input(s): "PROBNP" in the last 8760 hours. HbA1C: No results for input(s): "HGBA1C" in the last 72 hours. CBG: No results for input(s): "GLUCAP" in the last 168 hours.  Lipid Profile: No results for input(s): "CHOL", "HDL", "LDLCALC", "TRIG", "CHOLHDL", "LDLDIRECT" in the last 72 hours. Thyroid Function Tests: No results for input(s): "TSH", "T4TOTAL", "FREET4", "T3FREE", "THYROIDAB" in the last 72 hours. Anemia Panel: No results for input(s): "VITAMINB12", "FOLATE", "FERRITIN", "TIBC", "IRON", "RETICCTPCT" in the last 72 hours. Sepsis Labs: Recent Labs  Lab 07/25/22 0521  PROCALCITON 0.65    Recent Results (from the past 240 hour(s))  Respiratory (~20 pathogens) panel by PCR     Status: None   Collection Time: 07/23/22  9:10 PM   Specimen: Nasopharyngeal Swab; Respiratory  Result Value Ref Range Status   Adenovirus NOT DETECTED NOT DETECTED Final   Coronavirus 229E NOT DETECTED NOT DETECTED Final    Comment: (NOTE) The Coronavirus on the Respiratory Panel, DOES NOT test for the novel  Coronavirus (2019 nCoV)    Coronavirus HKU1 NOT DETECTED NOT DETECTED Final   Coronavirus NL63 NOT DETECTED NOT DETECTED Final   Coronavirus OC43 NOT DETECTED  NOT DETECTED Final   Metapneumovirus NOT DETECTED NOT DETECTED Final   Rhinovirus / Enterovirus NOT DETECTED NOT DETECTED Final   Influenza A NOT DETECTED NOT DETECTED Final   Influenza B NOT DETECTED NOT DETECTED Final   Parainfluenza Virus 1 NOT DETECTED NOT DETECTED Final   Parainfluenza Virus 2 NOT DETECTED NOT DETECTED Final   Parainfluenza Virus 3 NOT DETECTED NOT DETECTED Final   Parainfluenza Virus 4 NOT DETECTED NOT DETECTED Final   Respiratory Syncytial Virus NOT DETECTED NOT DETECTED Final   Bordetella pertussis NOT DETECTED NOT DETECTED Final   Bordetella Parapertussis NOT DETECTED NOT DETECTED Final   Chlamydophila pneumoniae NOT DETECTED NOT DETECTED Final   Mycoplasma pneumoniae NOT DETECTED NOT DETECTED Final    Comment: Performed at [name], [address], [place], [place] [address]  Culture, blood (x 2)     Status: None   Collection Time: 07/23/22  9:18 PM   Specimen: BLOOD  Result Value Ref Range Status   Specimen Description BLOOD RIGHT ANTECUBITAL  Final   Special Requests   Final    BOTTLES DRAWN AEROBIC AND ANAEROBIC Blood Culture adequate volume   Culture   Final    NO GROWTH 5 DAYS Performed at [facility], [address], [place], [place] [address]    Report Status 07/28/2022 FINAL  Final  Culture, blood (x 2)     Status: None   Collection Time: 07/23/22  9:18 PM   Specimen: BLOOD  Result Value Ref Range Status   Specimen Description BLOOD BLOOD RIGHT HAND  Final   Special Requests   Final    BOTTLES DRAWN AEROBIC AND ANAEROBIC Blood Culture adequate volume   Culture   Final    NO GROWTH 5 DAYS Performed at [name], [address], [place], [place] [address]    Report Status 07/28/2022 FINAL  Final  MRSA Next Gen by PCR, Nasal     Status: None   Collection Time: 07/25/22 12:18 PM  Specimen: Nasal Mucosa; Nasal Swab  Result Value Ref Range Status   MRSA by PCR Next Gen NOT DETECTED NOT DETECTED Final     Comment: (NOTE) The GeneXpert MRSA Assay (FDA approved for NASAL specimens only), is one component of a comprehensive MRSA colonization surveillance program. It is not intended to diagnose MRSA infection nor to guide or monitor treatment for MRSA infections. Test performance is not FDA approved in patients less than 78 years old. Performed at [name], [address] [place], [place] [address]          Radiology Studies: No results found.      Scheduled Meds:  (feeding supplement) PROSource Plus  30 mL Oral TID BM   vitamin C  500 mg Oral BID   aspirin EC  81 mg Oral QHS   enoxaparin (LOVENOX) injection  40 mg Subcutaneous Q24H   exemestane  25 mg Oral QPC breakfast   feeding supplement  237 mL Oral TID BM   folic acid  1 mg Oral Daily   levothyroxine  100 mcg Oral Q0600   metoprolol tartrate  12.5 mg Oral BID   multivitamin with minerals  1 tablet Oral Daily   pantoprazole  20 mg Oral Daily   rosuvastatin  5 mg Oral Daily   sodium chloride flush  3 mL Intravenous Q12H   thiamine  100 mg Oral Daily   traZODone  50 mg Oral QHS   zinc sulfate  220 mg Oral Daily   Continuous Infusions:  ampicillin-sulbactam (UNASYN) IV 1.5 g (07/31/22 0250)     LOS: 11 days    Time spent: 25 mins    Charise Killian, MD [facility] Pager [phone]  If 7PM-7AM, please contact night-coverage [email or web address] 07/31/2022, 7:54 AM

## 2022-07-31 NOTE — Progress Notes (Signed)
Nutrition Follow-up  DOCUMENTATION CODES:   Non-severe (moderate) malnutrition in context of chronic illness  INTERVENTION:   -Continue Ensure Enlive po TID, each supplement provides 350 kcal and 20 grams of protein. -Continue Magic cup TID with meals, each supplement provides 290 kcal and 9 grams of protein -Continue MVI po daily  -D/c 30 ml Prosource Plus TID, each supplement provides 100 kcals and 15 grams protein -Continue 500 mg vitamin C BID -Continue 220 mg zinc sulfate daily x 14 days  NUTRITION DIAGNOSIS:   Moderate Malnutrition related to chronic illness as evidenced by mild fat depletion, mild muscle depletion, percent weight loss.  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  7/9- s/p BSE- dysphagia 3 diet with thin liquids   Reviewed I/O's: +2.3 L x 24 hours   Per MD notes, mental status has improved. Pt spiked a fever yesterday, likely related to UTI. Pt remains on IV antibiotics.   Pt lying in bed at time of visit, sleeping. Pt did not respond to voice. No family at bedside.  Observed breakfast tray, which was untouched. Pt consumed an entire bottle of tea brought in from home. Oral intake has improved since last visit; noted PO's 40-100%. Pt drinking Ensure supplements, but mainly refusing Prosource. Will d/c Prosource due to improved oral intake.   Per TOC notes, plan to d/c to SNF once medically stable.   Wt has been stable over the past week.   Medications reviewed and include folic  acid and thiamine.   Labs reviewed: K: 3.4, CBGS: 104 (inpatient orders for glycemic control are none).    Diet Order:   Diet Order             DIET DYS 3 Room service appropriate? Yes with Assist; Fluid consistency: Thin  Diet effective now                   EDUCATION NEEDS:   Education needs have been addressed  Skin:  Skin Assessment: Skin Integrity Issues: Skin Integrity Issues:: Stage II Stage II: coccyx  Last BM:  07/23/22  Height:   Ht Readings from Last 1 Encounters:  07/18/22 5\' 6"  (1.676 m)    Weight:   Wt Readings from Last 1 Encounters:  07/31/22 86.1 kg    Ideal Body Weight:  59.1 kg  BMI:  Body mass index is 30.64 kg/m.  Estimated Nutritional Needs:   Kcal:  1750-1950  Protein:  90-105 grams  Fluid:  > 1.8 L    Levada Schilling, RD, LDN, CDCES Registered Dietitian II Certified Diabetes Care and Education Specialist Please refer to [facility] for RD and/or RD on-call/weekend/after hours pager

## 2022-08-01 ENCOUNTER — Other Ambulatory Visit: Payer: Self-pay | Admitting: *Deleted

## 2022-08-01 DIAGNOSIS — Z6830 Body mass index (BMI) 30.0-30.9, adult: Secondary | ICD-10-CM | POA: Diagnosis not present

## 2022-08-01 DIAGNOSIS — C50912 Malignant neoplasm of unspecified site of left female breast: Secondary | ICD-10-CM

## 2022-08-01 DIAGNOSIS — L03114 Cellulitis of left upper limb: Secondary | ICD-10-CM | POA: Diagnosis not present

## 2022-08-01 DIAGNOSIS — N183 Chronic kidney disease, stage 3 unspecified: Secondary | ICD-10-CM | POA: Diagnosis not present

## 2022-08-01 DIAGNOSIS — J99 Respiratory disorders in diseases classified elsewhere: Secondary | ICD-10-CM | POA: Diagnosis not present

## 2022-08-01 DIAGNOSIS — I10 Essential (primary) hypertension: Secondary | ICD-10-CM | POA: Diagnosis not present

## 2022-08-01 DIAGNOSIS — R600 Localized edema: Secondary | ICD-10-CM | POA: Diagnosis not present

## 2022-08-01 DIAGNOSIS — R41841 Cognitive communication deficit: Secondary | ICD-10-CM | POA: Diagnosis not present

## 2022-08-01 DIAGNOSIS — R197 Diarrhea, unspecified: Secondary | ICD-10-CM | POA: Diagnosis not present

## 2022-08-01 DIAGNOSIS — M8580 Other specified disorders of bone density and structure, unspecified site: Secondary | ICD-10-CM | POA: Diagnosis not present

## 2022-08-01 DIAGNOSIS — M6281 Muscle weakness (generalized): Secondary | ICD-10-CM | POA: Diagnosis not present

## 2022-08-01 DIAGNOSIS — J189 Pneumonia, unspecified organism: Secondary | ICD-10-CM | POA: Diagnosis not present

## 2022-08-01 DIAGNOSIS — D649 Anemia, unspecified: Secondary | ICD-10-CM | POA: Diagnosis not present

## 2022-08-01 DIAGNOSIS — G4700 Insomnia, unspecified: Secondary | ICD-10-CM | POA: Diagnosis not present

## 2022-08-01 DIAGNOSIS — C7951 Secondary malignant neoplasm of bone: Secondary | ICD-10-CM | POA: Diagnosis not present

## 2022-08-01 DIAGNOSIS — E876 Hypokalemia: Secondary | ICD-10-CM | POA: Diagnosis not present

## 2022-08-01 DIAGNOSIS — F419 Anxiety disorder, unspecified: Secondary | ICD-10-CM | POA: Diagnosis not present

## 2022-08-01 DIAGNOSIS — Z79811 Long term (current) use of aromatase inhibitors: Secondary | ICD-10-CM | POA: Diagnosis not present

## 2022-08-01 DIAGNOSIS — M79602 Pain in left arm: Secondary | ICD-10-CM | POA: Diagnosis not present

## 2022-08-01 DIAGNOSIS — R000 Tachycardia, unspecified: Secondary | ICD-10-CM | POA: Diagnosis not present

## 2022-08-01 DIAGNOSIS — R5383 Other fatigue: Secondary | ICD-10-CM | POA: Diagnosis not present

## 2022-08-01 DIAGNOSIS — N289 Disorder of kidney and ureter, unspecified: Secondary | ICD-10-CM | POA: Diagnosis not present

## 2022-08-01 DIAGNOSIS — K219 Gastro-esophageal reflux disease without esophagitis: Secondary | ICD-10-CM | POA: Diagnosis not present

## 2022-08-01 DIAGNOSIS — N1832 Chronic kidney disease, stage 3b: Secondary | ICD-10-CM | POA: Diagnosis not present

## 2022-08-01 DIAGNOSIS — E440 Moderate protein-calorie malnutrition: Secondary | ICD-10-CM | POA: Diagnosis not present

## 2022-08-01 DIAGNOSIS — Z9012 Acquired absence of left breast and nipple: Secondary | ICD-10-CM | POA: Diagnosis not present

## 2022-08-01 DIAGNOSIS — R21 Rash and other nonspecific skin eruption: Secondary | ICD-10-CM | POA: Diagnosis not present

## 2022-08-01 DIAGNOSIS — G629 Polyneuropathy, unspecified: Secondary | ICD-10-CM | POA: Diagnosis not present

## 2022-08-01 DIAGNOSIS — Z741 Need for assistance with personal care: Secondary | ICD-10-CM | POA: Diagnosis not present

## 2022-08-01 DIAGNOSIS — Z170 Estrogen receptor positive status [ER+]: Secondary | ICD-10-CM | POA: Diagnosis not present

## 2022-08-01 DIAGNOSIS — Z923 Personal history of irradiation: Secondary | ICD-10-CM | POA: Diagnosis not present

## 2022-08-01 DIAGNOSIS — E569 Vitamin deficiency, unspecified: Secondary | ICD-10-CM | POA: Diagnosis not present

## 2022-08-01 DIAGNOSIS — I890 Lymphedema, not elsewhere classified: Secondary | ICD-10-CM | POA: Diagnosis not present

## 2022-08-01 DIAGNOSIS — E785 Hyperlipidemia, unspecified: Secondary | ICD-10-CM | POA: Diagnosis not present

## 2022-08-01 DIAGNOSIS — G4733 Obstructive sleep apnea (adult) (pediatric): Secondary | ICD-10-CM | POA: Diagnosis not present

## 2022-08-01 DIAGNOSIS — G9341 Metabolic encephalopathy: Secondary | ICD-10-CM | POA: Diagnosis not present

## 2022-08-01 DIAGNOSIS — N3000 Acute cystitis without hematuria: Secondary | ICD-10-CM | POA: Diagnosis not present

## 2022-08-01 DIAGNOSIS — E039 Hypothyroidism, unspecified: Secondary | ICD-10-CM | POA: Diagnosis not present

## 2022-08-01 DIAGNOSIS — L89153 Pressure ulcer of sacral region, stage 3: Secondary | ICD-10-CM | POA: Diagnosis not present

## 2022-08-01 DIAGNOSIS — R531 Weakness: Secondary | ICD-10-CM | POA: Diagnosis not present

## 2022-08-01 DIAGNOSIS — I2699 Other pulmonary embolism without acute cor pulmonale: Secondary | ICD-10-CM | POA: Diagnosis not present

## 2022-08-01 DIAGNOSIS — L89152 Pressure ulcer of sacral region, stage 2: Secondary | ICD-10-CM | POA: Diagnosis not present

## 2022-08-01 DIAGNOSIS — Z86711 Personal history of pulmonary embolism: Secondary | ICD-10-CM | POA: Diagnosis not present

## 2022-08-01 DIAGNOSIS — C50919 Malignant neoplasm of unspecified site of unspecified female breast: Secondary | ICD-10-CM | POA: Diagnosis not present

## 2022-08-01 DIAGNOSIS — M6259 Muscle wasting and atrophy, not elsewhere classified, multiple sites: Secondary | ICD-10-CM | POA: Diagnosis not present

## 2022-08-01 DIAGNOSIS — L89896 Pressure-induced deep tissue damage of other site: Secondary | ICD-10-CM | POA: Diagnosis not present

## 2022-08-01 DIAGNOSIS — Z87891 Personal history of nicotine dependence: Secondary | ICD-10-CM | POA: Diagnosis not present

## 2022-08-01 LAB — BASIC METABOLIC PANEL
Anion gap: 7 (ref 5–15)
BUN: 7 mg/dL — ABNORMAL LOW (ref 8–23)
CO2: 25 mmol/L (ref 22–32)
Calcium: 8.5 mg/dL — ABNORMAL LOW (ref 8.9–10.3)
Chloride: 107 mmol/L (ref 98–111)
Creatinine, Ser: 0.86 mg/dL (ref 0.44–1.00)
GFR, Estimated: >60 mL/min (ref >60)
Glucose, Bld: 99 mg/dL (ref 70–99)
Potassium: 3.3 mmol/L — ABNORMAL LOW (ref 3.5–5.1)
Sodium: 139 mmol/L (ref 135–145)

## 2022-08-01 LAB — CBC
HCT: 25.1 % — ABNORMAL LOW (ref 36.0–46.0)
Hemoglobin: 8.1 g/dL — ABNORMAL LOW (ref 12.0–15.0)
MCH: 26 pg (ref 26.0–34.0)
MCHC: 32.3 g/dL (ref 30.0–36.0)
MCV: 80.7 fL (ref 80.0–100.0)
Platelets: 295 10*3/uL (ref 150–400)
RBC: 3.11 MIL/uL — ABNORMAL LOW (ref 3.87–5.11)
RDW: 17.7 % — ABNORMAL HIGH (ref 11.5–15.5)
WBC: 4.9 10*3/uL (ref 4.0–10.5)
nRBC: 0.4 % — ABNORMAL HIGH (ref 0.0–0.2)

## 2022-08-01 LAB — MAGNESIUM: Magnesium: 1.9 mg/dL (ref 1.7–2.4)

## 2022-08-01 LAB — PHOSPHORUS: Phosphorus: 2.8 mg/dL (ref 2.5–4.6)

## 2022-08-01 MED ORDER — ADULT MULTIVITAMIN W/MINERALS CH: 1.0000 | ORAL_TABLET | Freq: Every day | ORAL | Status: DC

## 2022-08-01 MED ORDER — TRAMADOL HCL 50 MG PO TABS: 50.0000 mg | ORAL_TABLET | Freq: Four times a day (QID) | ORAL | 0 refills | Status: DC | PRN

## 2022-08-01 MED ORDER — LEVOTHYROXINE SODIUM 100 MCG PO TABS: 100.0000 ug | ORAL_TABLET | Freq: Every day | ORAL | Status: DC

## 2022-08-01 MED ORDER — METOPROLOL TARTRATE 25 MG PO TABS: 12.5000 mg | ORAL_TABLET | Freq: Two times a day (BID) | ORAL | Status: DC

## 2022-08-01 MED ORDER — TRAZODONE HCL 50 MG PO TABS: 50.0000 mg | ORAL_TABLET | Freq: Every day | ORAL | Status: DC

## 2022-08-01 MED ORDER — ALPRAZOLAM 0.5 MG PO TABS: 0.5000 mg | ORAL_TABLET | Freq: Two times a day (BID) | ORAL | 0 refills | Status: DC | PRN

## 2022-08-01 NOTE — TOC Transition Note (Signed)
Transition of Care [facility]) - CM/SW Discharge Note   Patient Details  Name: Alicia [name] MRN: [id number] Date of Birth: [birth year]  [facility] CM/SW Contact:  Garret Reddish, RN Phone Number: 08/01/2022, 12:43 PM   Clinical Narrative:   Chart reviewed.  Noted that patient has orders for discharge today.    I have spoken with Tammy at [facility].  Tammy informs me that she will have a bed for patient today.  Tammy informs me that patient will be going to room 810p and the number to call report is [phone].    I have informed patient's son [name] of the above information.  I have informed Mr. Webb Silversmith that patient would be transported to [facility] by [facility].    I have called [facility] and arranged EMS transport.    I have informed staff nurse of the above information.       Final next level of care: Skilled Nursing Facility Barriers to Discharge: No Barriers Identified   Patient Goals and CMS Choice CMS Medicare.gov Compare Post Acute Care list provided to:: Patient Represenative (must comment) (Patient son [name]) Choice offered to / list presented to : Adult Children  Discharge Placement                Patient chooses bed at: [facility] [place] Patient to be transferred to facility by: [facility] Name of family member notified: [name] Patient and family notified of of transfer: 08/01/22  Discharge Plan and Services Additional resources added to the After Visit Summary for                                       Social Determinants of Health (SDOH) Interventions SDOH Screenings   Food Insecurity: No Food Insecurity (07/18/2022)  Housing: Low Risk  (07/18/2022)  Transportation Needs: No Transportation Needs (07/18/2022)  Utilities: Not At Risk (07/18/2022)  Alcohol Screen: Low Risk  (03/09/2022)  Depression (PHQ2-9): Low Risk  (03/09/2022)  Financial Resource Strain: High Risk (03/09/2022)   Physical Activity: Insufficiently Active (03/09/2022)  Social Connections: Socially Isolated (03/09/2022)  Stress: No Stress Concern Present (03/09/2022)  Tobacco Use: Medium Risk (07/18/2022)     Readmission Risk Interventions     No data to display

## 2022-08-01 NOTE — Discharge Summary (Signed)
Physician Discharge Summary   Patient: Alicia [name] MRN: [id number] DOB: 21-Nov-1950  Admit date:     07/18/2022  Discharge date: 08/01/22  Discharge Physician: Lurene Shadow   PCP: Corky Downs, MD   Recommendations at discharge:   Follow-up with physician at the nursing home within 3 days of discharge Monitor electrolytes/BMP within 1 week of discharge Outpatient follow-up with [name], oncologist  Discharge Diagnoses: Principal Problem:   Altered mental status Active Problems:   AKI (acute kidney injury) (HCC)   Electrolyte abnormality   Generalized weakness   QT prolongation   Elevated troponin   Diarrhea   Malignant neoplasm metastatic to bone (HCC)   Primary hypertension   Sepsis (HCC)   Hypokalemia   Hypophosphatemia   Acute metabolic encephalopathy   Malnutrition of moderate degree   Acute UTI   Pressure injury of skin   Pneumonia   Palliative care encounter  Resolved Problems:   * No resolved hospital problems. *  Hospital Course:  Alicia [name] is a 72 y.o. 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  Family said he had not taken any of her medicines for about a week prior to admission.   She was found to have acute UTI, acute metabolic encephalopathy, hypokalemia and hypophosphatemia.  He was treated with IV fluids and empiric antibiotics.  Electrolytes were repleted. Hospital course was complicated by sepsis and pneumonia which necessitated reinitiation of IV antibiotics.  Assessment and Plan:  Acute metabolic encephalopathy: Resolved     Sepsis secondary to right upper lobe pneumonia: No growth on blood cultures.  Completed course of antibiotics (IV vancomycin and cefepime followed by IV Unasyn from 07/24/2022 through  08/01/2022.     Acute UTI: Urine culture showed Klebsiella oxytoca.  Completed course of antibiotics.    AKI on CKD stage IIIb: Improved and creatinine is stable.     Hypokalemia: Improved.  Continue potassium repletion.  Monitor electrolytes     Hypophosphatemia: Improved     Prolonged QTc at 582 on 07/18/2022.  Repeat EKG on 07/23/2022 showed improvement in QTc to 458     Elevated troponins: This is likely due to demand ischemia.   Hypertension: Continue metoprolol.  Hydrochlorothiazide discontinued because of recurrent hypokalemia     Chronic diarrhea: Imodium as needed     Stage IV breast cancer with metastasis to the bone.  Outpatient follow-up with [name], oncologist. Per [name], okay to suspend chemotherapy so she can go to SNF for rehab (discussed via secure chat).     Stage II coccygeal decubitus ulcer (present on admission): Continue local wound care.     Her condition has improved and she's deemed stable for discharge today. Discharge plan was discussed with [name], son, over the phone.         Consultants: Palliative care team Procedures performed: None Disposition: Skilled nursing facility Diet recommendation:  Dysphagia type 3 thin Liquid DISCHARGE MEDICATION: Allergies as of 08/01/2022       Reactions   Sulfa Antibiotics Diarrhea        Medication [name]     STOP taking these medications    chlorhexidine 0.12 % solution Commonly known as: PERIDEX   dexamethasone 0.5 MG/5ML solution Commonly known as: DECADRON   docusate sodium 100 MG capsule Commonly known as: COLACE  everolimus 7.5 MG tablet Commonly known as: AFINITOR   exemestane 25 MG tablet Commonly known as: AROMASIN   hydrochlorothiazide 12.5 MG tablet Commonly known as: HYDRODIURIL   hydrocortisone 2.5 % lotion   nystatin-lidocaine-prednisoLONE-diphenhydrAMINE-distilled water-alum & mag hydroxide-simeth   olopatadine 0.1 % ophthalmic solution Commonly known  as: Patanol   ondansetron 8 MG tablet Commonly known as: ZOFRAN   predniSONE 10 MG tablet Commonly known as: DELTASONE   PreviDent 5000 Plus 1.1 % Crea dental cream Generic drug: sodium fluoride   triamcinolone cream 0.1 % Commonly known as: KENALOG       TAKE these medications    acetaminophen 500 MG tablet Commonly known as: TYLENOL Take 1,000 mg by mouth every 6 (six) hours as needed for moderate pain or headache.   Align 4 MG Caps Take 1 capsule by mouth daily.   ALPRAZolam 0.5 MG tablet Commonly known as: XANAX Take 1 tablet (0.5 mg total) by mouth 2 (two) times daily as needed for anxiety. What changed:  when to take this reasons to take this   amLODipine 5 MG tablet Commonly known as: Norvasc Take 1 tablet (5 mg total) by mouth daily.   aspirin EC 81 MG tablet Take 81 mg by mouth at bedtime.   calcium carbonate 1250 (500 Ca) MG tablet Commonly known as: OS-CAL - dosed in mg of elemental calcium Take 1 tablet by mouth daily with breakfast.   levothyroxine 100 MCG tablet Commonly known as: SYNTHROID Take 1 tablet (100 mcg total) by mouth daily before breakfast.   loratadine 10 MG tablet Commonly known as: CLARITIN Take 10 mg by mouth daily. Take to reduce risk of rash from everolimus.   metoprolol tartrate 25 MG tablet Commonly known as: LOPRESSOR Take 0.5 tablets (12.5 mg total) by mouth 2 (two) times daily.   multivitamin with minerals Tabs tablet Take 1 tablet by mouth daily.   pantoprazole 20 MG tablet Commonly known as: PROTONIX TAKE 1 TABLET(20 MG) BY MOUTH DAILY What changed: See the new instructions.   potassium chloride SA 20 MEQ tablet Commonly known as: KLOR-CON M TAKE 1 TABLET BY MOUTH TWICE DAILY   rosuvastatin 5 MG tablet Commonly known as: CRESTOR Take 1 tablet (5 mg total) by mouth daily.   traMADol 50 MG tablet Commonly known as: ULTRAM Take 1 tablet (50 mg total) by mouth every 6 (six) hours as needed.   traZODone 50  MG tablet Commonly known as: DESYREL Take 1 tablet (50 mg total) by mouth at bedtime.   Vitamin D3 125 MCG (5000 UT) Caps Take 5,000 Units by mouth 2 (two) times a day.               Discharge Care Instructions  (From admission, onward)           Start     Ordered   08/01/22 0000  Discharge wound care:       Comments: Apply Mepilex border to stage II coccygeal decubitus ulcer.  Avoid laying on your back/buttocks or sitting on your buttocks for long periods of time   08/01/22 1051            Contact information for after-discharge care     Destination     [facility] Preferred SNF .   Service: Skilled Nursing Contact information: [address] [place] [address] [phone]                    Discharge Exam:  Filed Weights   07/29/22 0500 07/31/22 0302 08/01/22 0500  Weight: 88.5 kg 86.1 kg 86.9 kg   GEN: NAD SKIN: Warm and dry EYES: No pallor or icterus ENT: MMM CV: RRR PULM: CTA B ABD: soft, ND, NT, +BS CNS: AAO x 3, non focal EXT: No edema or tenderness   Condition at discharge: good  The results of significant diagnostics from this hospitalization (including imaging, microbiology, ancillary and laboratory) are listed below for reference.   Imaging Studies: CT ABDOMEN PELVIS W CONTRAST  Result Date: 07/24/2022 CLINICAL DATA:  Sepsis. Stage IV carcinoma of the breast with bony Mets. EXAM: CT ABDOMEN AND PELVIS WITH CONTRAST TECHNIQUE: Multidetector CT imaging of the abdomen and pelvis was performed using the standard protocol following bolus administration of intravenous contrast. RADIATION DOSE REDUCTION: This exam was performed according to the departmental dose-optimization program which includes automated exposure control, adjustment of the mA and/or kV according to patient size and/or use of iterative reconstruction technique. CONTRAST:  60mL OMNIPAQUE IOHEXOL 350 MG/ML SOLN COMPARISON:  PET-CT  03/29/2022. FINDINGS: Lower chest: There is dependent atelectasis in the bilateral lower lobes. There some patchy ground-glass opacities in the right middle lobe. There is a calcified granuloma in the right lower lobe. Hepatobiliary: No focal liver abnormality is seen. No gallstones, gallbladder wall thickening, or biliary dilatation. Pancreas: Unremarkable. No pancreatic ductal dilatation or surrounding inflammatory changes. Spleen: Normal in size without focal abnormality. Adrenals/Urinary Tract: There are 2 left renal cysts. The largest measures 5.6 cm. Otherwise, the kidneys, adrenal glands, and bladder are within normal limits. Stomach/Bowel: Stomach is within normal limits. Appendix appears normal. No evidence of bowel wall thickening, distention, or inflammatory changes. There is sigmoid colon diverticulosis. Vascular/Lymphatic: Aortic atherosclerosis. No enlarged abdominal or pelvic lymph nodes. Reproductive: Uterus and bilateral adnexa are unremarkable. Other: There is new presacral edema. There is no ascites or focal abdominal wall hernia. There is mild body wall edema. Small amount of subcutaneous air in the lower anterior abdominal wall is likely related to medication injection sites. Musculoskeletal: Osseous metastatic lesions are again noted most significant in the left iliac wing and L5 vertebral body similar to the prior study. IMPRESSION: 1. New presacral edema and mild body wall edema. 2. New patchy ground-glass opacities in the right middle lobe may represent infectious/inflammatory process. 3. Stable osseous metastatic disease. 4. Left Bosniak I benign renal cyst measuring 5.6 cm. No follow-up imaging is recommended. JACR 2018 Feb; 264-273, Management of the Incidental Renal Mass on CT, [employer] 2021; 814-848, Bosniak Classification of Cystic Renal Masses, Version 2019. Aortic Atherosclerosis (ICD10-I70.0). Electronically Signed   By: Darliss Cheney M.D.   On: 07/24/2022 01:23   CT Angio  Chest Pulmonary Embolism (PE) W or WO Contrast  Result Date: 07/24/2022 CLINICAL DATA:  Pulmonary embolism (PE) suspected, low to intermediate prob, neg D-dimer. Metastatic breast cancer. History of pulmonary embolus. Generalized weakness EXAM: CT ANGIOGRAPHY CHEST WITH CONTRAST TECHNIQUE: Multidetector CT imaging of the chest was performed using the standard protocol during bolus administration of intravenous contrast. Multiplanar CT image reconstructions and MIPs were obtained to evaluate the vascular anatomy. RADIATION DOSE REDUCTION: This exam was performed according to the departmental dose-optimization program which includes automated exposure control, adjustment of the mA and/or kV according to patient size and/or use of iterative reconstruction technique. CONTRAST:  60mL OMNIPAQUE IOHEXOL 350 MG/ML SOLN COMPARISON:  PET CT 03/29/2022 FINDINGS: Cardiovascular: Heart is normal size. Aorta is normal caliber. Scattered aortic calcifications. No filling defects in the pulmonary arteries  to suggest pulmonary emboli. Mediastinum/Nodes: No mediastinal, hilar, or axillary adenopathy. Trachea and esophagus are unremarkable. Thyroid unremarkable. Lungs/Pleura: Mild centrilobular emphysema. Dependent opacities in the lungs compatible with dependent atelectasis. Calcified granuloma in the right lower lobe. Airspace opacity noted in the posterior right upper lobe. Cannot exclude pneumonia. No effusions. Upper Abdomen: No acute findings Musculoskeletal: Prior left mastectomy. Chest wall soft tissues are unremarkable. Bilateral scapular osseous lesions compatible with metastases. Review of the MIP images confirms the above findings. IMPRESSION: No evidence of pulmonary embolus. Dependent atelectasis. Airspace opacity noted in the posterior right upper lobe concerning for pneumonia. Osseous metastatic disease involving the scapulae. Aortic Atherosclerosis (ICD10-I70.0) and Emphysema (ICD10-J43.9). Electronically Signed    By: Charlett Nose M.D.   On: 07/24/2022 01:02   DG Chest Port 1 View  Result Date: 07/23/2022 CLINICAL DATA:  Sepsis. EXAM: PORTABLE CHEST 1 VIEW COMPARISON:  Chest radiograph dated 07/18/2022. FINDINGS: No focal consolidation, pleural effusion, pneumothorax. Mild diffuse chronic intra coarsening. The cardiac silhouette is within normal limits. No acute osseous pathology. IMPRESSION: No active disease. Electronically Signed   By: Elgie Collard M.D.   On: 07/23/2022 20:42   DG Chest Port 1 View  Result Date: 07/18/2022 CLINICAL DATA:  Shortness of breath.  History of breast cancer. EXAM: PORTABLE CHEST 1 VIEW COMPARISON:  X-ray 10/27/2019.  PET-CT scan 03/29/2022 FINDINGS: Overlapping cardiac leads. Normal cardiopericardial silhouette. No consolidation, pneumothorax or effusion. There is some linear opacity left lung base likely scar or atelectasis. Older rib fractures are identified. The subtle left upper lobe opacity corresponds to the finding by CT scan of some pleural thickening and interstitial septal thickening. Previous left mastectomy. IMPRESSION: No acute cardiopulmonary disease. Chronic changes. Mild left basilar scar or atelectasis. Electronically Signed   By: Karen Kays M.D.   On: 07/18/2022 17:24    Microbiology: Results for orders placed or performed during the hospital encounter of 07/18/22  Culture, blood (Routine x 2)     Status: None   Collection Time: 07/18/22  4:52 PM   Specimen: BLOOD  Result Value Ref Range Status   Specimen Description BLOOD RIGHT ANTECUBITAL  Final   Special Requests   Final    BOTTLES DRAWN AEROBIC AND ANAEROBIC Blood Culture results may not be optimal due to an excessive volume of blood received in culture bottles   Culture   Final    NO GROWTH 5 DAYS Performed at [facility], [address], [place], [place] [address]    Report Status 07/23/2022 FINAL  Final  Culture, blood (Routine x 2)     Status: None   Collection Time: 07/18/22   5:08 PM   Specimen: BLOOD  Result Value Ref Range Status   Specimen Description BLOOD BLOOD RIGHT ARM  Final   Special Requests   Final    BOTTLES DRAWN AEROBIC AND ANAEROBIC Blood Culture adequate volume   Culture   Final    NO GROWTH 5 DAYS Performed at [facility], [address], [place], [place] [address]    Report Status 07/23/2022 FINAL  Final  Urine Culture (for pregnant, neutropenic or urologic patients or patients with an indwelling urinary catheter)     Status: Abnormal   Collection Time: 07/18/22  8:30 PM   Specimen: Urine, Clean Catch  Result Value Ref Range Status   Specimen Description   Final    URINE, CLEAN CATCH Performed at [facility], [address], [place], [place] [address]    Special Requests  Final    NONE Performed at [facility], [address] [place], [place] [address]    Culture >=100,000 COLONIES/mL KLEBSIELLA OXYTOCA (A)  Final   Report Status 07/21/2022 FINAL  Final   Organism ID, Bacteria KLEBSIELLA OXYTOCA (A)  Final      Susceptibility   Klebsiella oxytoca - MIC*    AMPICILLIN >=32 RESISTANT Resistant     CEFEPIME <=0.12 SENSITIVE Sensitive     CEFTRIAXONE <=0.25 SENSITIVE Sensitive     CIPROFLOXACIN <=0.25 SENSITIVE Sensitive     GENTAMICIN <=1 SENSITIVE Sensitive     IMIPENEM <=0.25 SENSITIVE Sensitive     NITROFURANTOIN <=16 SENSITIVE Sensitive     TRIMETH/SULFA <=20 SENSITIVE Sensitive     AMPICILLIN/SULBACTAM 16 INTERMEDIATE Intermediate     PIP/TAZO <=4 SENSITIVE Sensitive     * >=100,000 COLONIES/mL KLEBSIELLA OXYTOCA  Respiratory (~20 pathogens) panel by PCR     Status: None   Collection Time: 07/23/22  9:10 PM   Specimen: Nasopharyngeal Swab; Respiratory  Result Value Ref Range Status   Adenovirus NOT DETECTED NOT DETECTED Final   Coronavirus 229E NOT DETECTED NOT DETECTED Final    Comment: (NOTE) The Coronavirus on the Respiratory Panel, DOES NOT test for the novel  Coronavirus (2019  nCoV)    Coronavirus HKU1 NOT DETECTED NOT DETECTED Final   Coronavirus NL63 NOT DETECTED NOT DETECTED Final   Coronavirus OC43 NOT DETECTED NOT DETECTED Final   Metapneumovirus NOT DETECTED NOT DETECTED Final   Rhinovirus / Enterovirus NOT DETECTED NOT DETECTED Final   Influenza A NOT DETECTED NOT DETECTED Final   Influenza B NOT DETECTED NOT DETECTED Final   Parainfluenza Virus 1 NOT DETECTED NOT DETECTED Final   Parainfluenza Virus 2 NOT DETECTED NOT DETECTED Final   Parainfluenza Virus 3 NOT DETECTED NOT DETECTED Final   Parainfluenza Virus 4 NOT DETECTED NOT DETECTED Final   Respiratory Syncytial Virus NOT DETECTED NOT DETECTED Final   Bordetella pertussis NOT DETECTED NOT DETECTED Final   Bordetella Parapertussis NOT DETECTED NOT DETECTED Final   Chlamydophila pneumoniae NOT DETECTED NOT DETECTED Final   Mycoplasma pneumoniae NOT DETECTED NOT DETECTED Final    Comment: Performed at [facility], [address], [place], [place] [address]  Culture, blood (x 2)     Status: None   Collection Time: 07/23/22  9:18 PM   Specimen: BLOOD  Result Value Ref Range Status   Specimen Description BLOOD RIGHT ANTECUBITAL  Final   Special Requests   Final    BOTTLES DRAWN AEROBIC AND ANAEROBIC Blood Culture adequate volume   Culture   Final    NO GROWTH 5 DAYS Performed at [facility], [address] [place], [place] [address]    Report Status 07/28/2022 FINAL  Final  Culture, blood (x 2)     Status: None   Collection Time: 07/23/22  9:18 PM   Specimen: BLOOD  Result Value Ref Range Status   Specimen Description BLOOD BLOOD RIGHT HAND  Final   Special Requests   Final    BOTTLES DRAWN AEROBIC AND ANAEROBIC Blood Culture adequate volume   Culture   Final    NO GROWTH 5 DAYS Performed at [facility] [address] [place], [place] [address]    Report Status 07/28/2022 FINAL  Final  MRSA Next Gen by PCR, Nasal     Status: None   Collection Time: 07/25/22  12:18 PM   Specimen: Nasal Mucosa; Nasal Swab  Result Value Ref  Range Status   MRSA by PCR Next Gen NOT DETECTED NOT DETECTED Final    Comment: (NOTE) The GeneXpert MRSA Assay (FDA approved for NASAL specimens only), is one component of a comprehensive MRSA colonization surveillance program. It is not intended to diagnose MRSA infection nor to guide or monitor treatment for MRSA infections. Test performance is not FDA approved in patients less than 64 years old. Performed at [facility], [address] [place], [place] [address]     Labs: CBC: Recent Labs  Lab 07/28/22 0618 07/29/22 0327 07/30/22 0335 07/31/22 0356 08/01/22 0510  WBC 4.6 4.7 5.2 4.7 4.9  HGB 7.8* 8.3* 8.8* 7.7* 8.1*  HCT 23.3* 26.0* 27.5* 23.8* 25.1*  MCV 80.1 81.5 81.8 79.6* 80.7  PLT 166 200 238 250 295   Basic Metabolic Panel: Recent Labs  Lab 07/26/22 0333 07/27/22 0337 07/28/22 0618 07/29/22 0327 07/30/22 0335 07/31/22 0356 08/01/22 0510  NA 139   < > 138 139 139 140 139  K 3.3*   < > 3.2* 3.6 3.0* 3.4* 3.3*  CL 105   < > 107 106 105 105 107  CO2 22   < > 23 23 26 24 25   GLUCOSE 90   < > 97 105* 89 91 99  BUN 15   < > 10 11 8  7* 7*  CREATININE 1.30*   < > 1.00 0.96 0.88 0.86 0.86  CALCIUM 8.7*   < > 8.5* 8.5* 8.5* 8.4* 8.5*  MG 1.8  --   --  2.2 1.9 2.1 1.9  PHOS 2.7  --   --  2.3* 4.0 2.6 2.8   < > = values in this interval not displayed.   Liver Function Tests: No results for input(s): "AST", "ALT", "ALKPHOS", "BILITOT", "PROT", "ALBUMIN" in the last 168 hours. CBG: No results for input(s): "GLUCAP" in the last 168 hours.  Discharge time spent: greater than 30 minutes.  Signed: Lurene Shadow, MD [facility] 08/01/2022

## 2022-08-01 NOTE — Progress Notes (Signed)
Physical Therapy Treatment Patient Details Name: Alicia [name] MRN: [id number] DOB: 18-Aug-1950 Today's Date: 08/01/2022   History of Present Illness Alicia [name] is a 72 y.o. 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  Family said she had not taken any of her medicines for about a week prior to admission.    PT Comments  Increased ambulation distance this session. Patient is making progress with increased activity tolerance. She is not at her baseline. Recommend to continue PT to maximize independence and decrease caregiver burden.     Assistance Recommended at Discharge Frequent or constant Supervision/Assistance  If plan is discharge home, recommend the following:  Can travel by private vehicle    A little help with walking and/or transfers;A lot of help with bathing/dressing/bathroom;Assistance with cooking/housework;Direct supervision/assist for medications management;Direct supervision/assist for financial management;Assist for transportation;Help with stairs or ramp for entrance   No  Equipment Recommendations   (to be determined at next level of care)    Recommendations for Other Services       Precautions / Restrictions Precautions Precautions: Fall Restrictions Weight Bearing Restrictions: No     Mobility  Bed Mobility Overal bed mobility: Needs Assistance Bed Mobility: Supine to Sit, Sit to Supine     Supine to sit: Min guard Sit to supine: Min assist   General bed mobility comments: assistance for LE support to return to bed. verbal cues for sequencing    Transfers Overall transfer level: Needs assistance Equipment used: Rolling walker (2 wheels) Transfers: Sit to/from Stand Sit to Stand: Min assist           General  transfer comment: lifting assistance required for standing x 2 bouts. verbal cues for hand placement for safety    Ambulation/Gait Ambulation/Gait assistance: Min assist Gait Distance (Feet): 20 Feet (x 2 bouts) Assistive device: Rolling walker (2 wheels) Gait Pattern/deviations: Step-through pattern, Trunk flexed Gait velocity: decreased     General Gait Details: patient ambulated x 2 bouts with seated rest breaks between bouts of walking. cues to stand closer to rolling walker for support   Stairs             Wheelchair Mobility     Tilt Bed    Modified Rankin (Stroke Patients Only)       Balance Overall balance assessment: Needs assistance Sitting-balance support: Feet supported, Bilateral upper extremity supported Sitting balance-Leahy Scale: Fair     Standing balance support: Bilateral upper extremity supported Standing balance-Leahy Scale: Poor Standing balance comment: poor progressing to fair. brief periods without RW support during static standing with no external support required from therapist                            Cognition Arousal/Alertness: Awake/alert Behavior During Therapy: Flat affect Overall Cognitive Status: History of cognitive impairments - at baseline                                 General Comments: patient has some confusion about discharge to rehab with all questions answered. she is able to follow single step commands with increased time for progressing        Exercises      General Comments  Pertinent Vitals/Pain Pain Assessment Pain Assessment: No/denies pain    Home Living                          Prior Function            PT Goals (current goals can now be found in the care plan section) Acute Rehab PT Goals Patient Stated Goal: to get to rehab PT Goal Formulation: With patient Time For Goal Achievement: 08/14/22 Potential to Achieve Goals: Fair Progress towards PT  goals: Progressing toward goals    Frequency    Min 1X/week      PT Plan Current plan remains appropriate    Co-evaluation              AM-PAC PT "6 Clicks" Mobility   Outcome Measure  Help needed turning from your back to your side while in a flat bed without using bedrails?: A Little Help needed moving from lying on your back to sitting on the side of a flat bed without using bedrails?: A Lot Help needed moving to and from a bed to a chair (including a wheelchair)?: A Little Help needed standing up from a chair using your arms (e.g., wheelchair or bedside chair)?: A Lot Help needed to walk in hospital room?: A Little Help needed climbing 3-5 steps with a railing? : A Lot 6 Click Score: 15    End of Session Equipment Utilized During Treatment: Gait belt Activity Tolerance: Patient tolerated treatment well Patient left: in bed;with call bell/phone within reach;with bed alarm set   PT Visit Diagnosis: Muscle weakness (generalized) (M62.81);Difficulty in walking, not elsewhere classified (R26.2)     Time: 2595-6387 PT Time Calculation (min) (ACUTE ONLY): 31 min  Charges:    $Gait Training: 8-22 mins $Therapeutic Activity: 8-22 mins PT General Charges $$ ACUTE PT VISIT: 1 Visit                     Donna Bernard, PT, MPT    Ina Homes 08/01/2022, 11:48 AM

## 2022-08-02 DIAGNOSIS — L89152 Pressure ulcer of sacral region, stage 2: Secondary | ICD-10-CM | POA: Diagnosis not present

## 2022-08-02 DIAGNOSIS — Z6830 Body mass index (BMI) 30.0-30.9, adult: Secondary | ICD-10-CM | POA: Diagnosis not present

## 2022-08-02 DIAGNOSIS — L89896 Pressure-induced deep tissue damage of other site: Secondary | ICD-10-CM | POA: Diagnosis not present

## 2022-08-02 DIAGNOSIS — L89153 Pressure ulcer of sacral region, stage 3: Secondary | ICD-10-CM | POA: Diagnosis not present

## 2022-08-02 DIAGNOSIS — N183 Chronic kidney disease, stage 3 unspecified: Secondary | ICD-10-CM | POA: Diagnosis not present

## 2022-08-02 DIAGNOSIS — E440 Moderate protein-calorie malnutrition: Secondary | ICD-10-CM | POA: Diagnosis not present

## 2022-08-06 DIAGNOSIS — L89152 Pressure ulcer of sacral region, stage 2: Secondary | ICD-10-CM | POA: Diagnosis not present

## 2022-08-06 DIAGNOSIS — E440 Moderate protein-calorie malnutrition: Secondary | ICD-10-CM | POA: Diagnosis not present

## 2022-08-06 DIAGNOSIS — N1832 Chronic kidney disease, stage 3b: Secondary | ICD-10-CM | POA: Diagnosis not present

## 2022-08-07 ENCOUNTER — Other Ambulatory Visit (HOSPITAL_COMMUNITY): Payer: Self-pay

## 2022-08-08 DIAGNOSIS — R600 Localized edema: Secondary | ICD-10-CM | POA: Diagnosis not present

## 2022-08-08 DIAGNOSIS — I10 Essential (primary) hypertension: Secondary | ICD-10-CM | POA: Diagnosis not present

## 2022-08-08 DIAGNOSIS — R000 Tachycardia, unspecified: Secondary | ICD-10-CM | POA: Diagnosis not present

## 2022-08-09 DIAGNOSIS — N183 Chronic kidney disease, stage 3 unspecified: Secondary | ICD-10-CM | POA: Diagnosis not present

## 2022-08-09 DIAGNOSIS — Z6830 Body mass index (BMI) 30.0-30.9, adult: Secondary | ICD-10-CM | POA: Diagnosis not present

## 2022-08-09 DIAGNOSIS — E440 Moderate protein-calorie malnutrition: Secondary | ICD-10-CM | POA: Diagnosis not present

## 2022-08-09 DIAGNOSIS — L89153 Pressure ulcer of sacral region, stage 3: Secondary | ICD-10-CM | POA: Diagnosis not present

## 2022-08-13 DIAGNOSIS — I10 Essential (primary) hypertension: Secondary | ICD-10-CM | POA: Diagnosis not present

## 2022-08-13 DIAGNOSIS — R000 Tachycardia, unspecified: Secondary | ICD-10-CM | POA: Diagnosis not present

## 2022-08-13 DIAGNOSIS — R600 Localized edema: Secondary | ICD-10-CM | POA: Diagnosis not present

## 2022-08-15 ENCOUNTER — Other Ambulatory Visit (HOSPITAL_COMMUNITY): Payer: Self-pay

## 2022-08-15 ENCOUNTER — Inpatient Hospital Stay: Payer: Medicare Other | Attending: Oncology | Admitting: Oncology

## 2022-08-15 ENCOUNTER — Inpatient Hospital Stay: Payer: Medicare Other

## 2022-08-15 ENCOUNTER — Encounter: Payer: Self-pay | Admitting: Oncology

## 2022-08-15 VITALS — BP 117/57 | HR 82 | Temp 98.3°F | Resp 16 | Wt 186.0 lb

## 2022-08-15 DIAGNOSIS — Z170 Estrogen receptor positive status [ER+]: Secondary | ICD-10-CM | POA: Insufficient documentation

## 2022-08-15 DIAGNOSIS — R531 Weakness: Secondary | ICD-10-CM | POA: Insufficient documentation

## 2022-08-15 DIAGNOSIS — Z9012 Acquired absence of left breast and nipple: Secondary | ICD-10-CM | POA: Insufficient documentation

## 2022-08-15 DIAGNOSIS — C7951 Secondary malignant neoplasm of bone: Secondary | ICD-10-CM | POA: Diagnosis not present

## 2022-08-15 DIAGNOSIS — Z79811 Long term (current) use of aromatase inhibitors: Secondary | ICD-10-CM | POA: Diagnosis not present

## 2022-08-15 DIAGNOSIS — C50912 Malignant neoplasm of unspecified site of left female breast: Secondary | ICD-10-CM | POA: Insufficient documentation

## 2022-08-15 DIAGNOSIS — N289 Disorder of kidney and ureter, unspecified: Secondary | ICD-10-CM | POA: Insufficient documentation

## 2022-08-15 DIAGNOSIS — Z87891 Personal history of nicotine dependence: Secondary | ICD-10-CM | POA: Insufficient documentation

## 2022-08-15 DIAGNOSIS — R5383 Other fatigue: Secondary | ICD-10-CM | POA: Insufficient documentation

## 2022-08-15 DIAGNOSIS — D649 Anemia, unspecified: Secondary | ICD-10-CM | POA: Diagnosis not present

## 2022-08-15 DIAGNOSIS — M8580 Other specified disorders of bone density and structure, unspecified site: Secondary | ICD-10-CM | POA: Diagnosis not present

## 2022-08-15 DIAGNOSIS — C50919 Malignant neoplasm of unspecified site of unspecified female breast: Secondary | ICD-10-CM | POA: Diagnosis not present

## 2022-08-15 DIAGNOSIS — G629 Polyneuropathy, unspecified: Secondary | ICD-10-CM | POA: Insufficient documentation

## 2022-08-15 LAB — CBC WITH DIFFERENTIAL/PLATELET
Abs Immature Granulocytes: 0.08 10*3/uL — ABNORMAL HIGH (ref 0.00–0.07)
Basophils Absolute: 0 10*3/uL (ref 0.0–0.1)
Basophils Relative: 0 %
Eosinophils Absolute: 0.1 10*3/uL (ref 0.0–0.5)
Eosinophils Relative: 2 %
HCT: 29.5 % — ABNORMAL LOW (ref 36.0–46.0)
Hemoglobin: 9 g/dL — ABNORMAL LOW (ref 12.0–15.0)
Immature Granulocytes: 1 %
Lymphocytes Relative: 5 %
Lymphs Abs: 0.3 10*3/uL — ABNORMAL LOW (ref 0.7–4.0)
MCH: 27 pg (ref 26.0–34.0)
MCHC: 30.5 g/dL (ref 30.0–36.0)
MCV: 88.6 fL (ref 80.0–100.0)
Monocytes Absolute: 0.6 10*3/uL (ref 0.1–1.0)
Monocytes Relative: 11 %
Neutro Abs: 4.5 10*3/uL (ref 1.7–7.7)
Neutrophils Relative %: 81 %
Platelets: 243 10*3/uL (ref 150–400)
RBC: 3.33 MIL/uL — ABNORMAL LOW (ref 3.87–5.11)
RDW: 22.9 % — ABNORMAL HIGH (ref 11.5–15.5)
WBC: 5.7 10*3/uL (ref 4.0–10.5)
nRBC: 0 % (ref 0.0–0.2)

## 2022-08-15 LAB — CMP (CANCER CENTER ONLY)
ALT: 18 U/L (ref 0–44)
AST: 31 U/L (ref 15–41)
Albumin: 3.1 g/dL — ABNORMAL LOW (ref 3.5–5.0)
Alkaline Phosphatase: 117 U/L (ref 38–126)
Anion gap: 11 (ref 5–15)
BUN: 8 mg/dL (ref 8–23)
CO2: 22 mmol/L (ref 22–32)
Calcium: 9.3 mg/dL (ref 8.9–10.3)
Chloride: 103 mmol/L (ref 98–111)
Creatinine: 1.13 mg/dL — ABNORMAL HIGH (ref 0.44–1.00)
GFR, Estimated: 51 mL/min — ABNORMAL LOW (ref >60)
Glucose, Bld: 124 mg/dL — ABNORMAL HIGH (ref 70–99)
Potassium: 3.6 mmol/L (ref 3.5–5.1)
Sodium: 136 mmol/L (ref 135–145)
Total Bilirubin: 0.7 mg/dL (ref 0.3–1.2)
Total Protein: 6.4 g/dL — ABNORMAL LOW (ref 6.5–8.1)

## 2022-08-15 NOTE — Progress Notes (Signed)
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  INTERVAL HISTORY: Patient returns for to clinic for further evaluation and hospital follow-up.  She was recently in the hospital with UTI, altered mental status, and sepsis.  She is now at an assisted living for rehab.  She continues to have significant weakness and fatigue as well as edema, but states these are improving.  She otherwise feels well.  Her jaw pain has resolved.  She continues to have a mild peripheral neuropathy, but no other neurologic complaints.  She denies any chest pain, shortness of breath, cough, or hemoptysis.  She denies any nausea, vomiting, or constipation.  She has no urinary complaints.  Patient offers no other specific complaints today.  REVIEW OF SYSTEMS:   Review of Systems  Constitutional:  Positive for malaise/fatigue. Negative for fever and weight loss.  HENT:  Negative for congestion.   Respiratory: Negative.  Negative for cough and shortness of breath.   Cardiovascular:  Positive for leg swelling. Negative for chest pain.  Gastrointestinal: Negative.  Negative for abdominal pain, constipation, diarrhea and nausea.  Genitourinary: Negative.  Negative for dysuria, flank pain and urgency.  Musculoskeletal: Negative.  Negative for back pain and joint pain.  Skin: Negative.  Negative for rash.  Neurological:  Positive for tingling, sensory change and weakness. Negative for  dizziness, focal weakness and headaches.  Psychiatric/Behavioral:  The patient is not nervous/anxious.     As per HPI. Otherwise, a complete review of systems is negative.  PAST MEDICAL HISTORY: Past Medical History:  Diagnosis Date   Anxiety    Breast cancer, left (HCC) 07/2017   Depression    Hypertension    Hypothyroidism    Personal history of chemotherapy 2019   LEFT mastectomy-chemo before   Personal history of radiation therapy 03/2018   LEFT mastectomy   Rapid heart rate    Thyroid disease     PAST SURGICAL HISTORY: Past Surgical History:  Procedure Laterality Date   AXILLARY LYMPH NODE BIOPSY Left 07/16/2017   METASTATIC MAMMARY CARCINOMA   BREAST BIOPSY Left 07/16/2017   [place] bx of left breast mass and left breast LN.  INVASIVE MAMMARY CARCINOMA, NO SPECIAL TYPE.    BREAST EXCISIONAL BIOPSY Right 2001   benign   BREAST LUMPECTOMY WITH SENTINEL LYMPH NODE BIOPSY Left 12/06/2017   Procedure: BREAST LUMPECTOMY WITH SENTINEL LYMPH NODE BX;  Surgeon: Earline Mayotte, MD;  Location: [facility] ORS;  Service: General;  Laterality: Left;   COLONOSCOPY     MASTECTOMY Left 12/23/2017   PORTACATH PLACEMENT Right 08/07/2017   Procedure: INSERTION PORT-A-CATH;  Surgeon: Earline Mayotte, MD;  Location: [facility] ORS;  Service: General;  Laterality: Right;   SIMPLE MASTECTOMY WITH AXILLARY SENTINEL NODE BIOPSY Left 12/23/2017   T2,N2 with 6/7 nodes positive. Whole breast radiation.  Surgeon: Earline Mayotte, MD;  Location: [facility] ORS;  Service: General;  Laterality: Left;   TONSILLECTOMY      FAMILY HISTORY: Family History  Problem Relation Age of Onset   Stroke Mother  Thyroid disease Mother    Renal Disease Mother    Stroke Father    Heart attack Father    Sudden death Father 33       suicide   Anuerysm Brother    Breast cancer Neg Hx     ADVANCED DIRECTIVES (Y/N):  N  HEALTH MAINTENANCE: Social History   Tobacco Use   Smoking status: Former    Current packs/day:  0.00    Average packs/day: 1 pack/day for 30.0 years (30.0 ttl pk-yrs)    Types: Cigarettes    Start date: 26    Quit date: 2019    Years since quitting: 5.5   Smokeless tobacco: Never  Vaping Use   Vaping status: Never Used  Substance Use Topics   Alcohol use: Not Currently   Drug use: Never     Colonoscopy:  PAP:  Bone density:  Lipid panel:  Allergies  Allergen Reactions   Sulfa Antibiotics Diarrhea    Current Outpatient Medications  Medication Sig Dispense Refill   acetaminophen (TYLENOL) 500 MG tablet Take 1,000 mg by mouth every 6 (six) hours as needed for moderate pain or headache.      ALPRAZolam (XANAX) 0.5 MG tablet Take 1 tablet (0.5 mg total) by mouth 2 (two) times daily as needed for anxiety. 10 tablet 0   amLODipine (NORVASC) 5 MG tablet Take 1 tablet (5 mg total) by mouth daily. 90 tablet 3   aspirin EC 81 MG tablet Take 81 mg by mouth at bedtime.      calcium carbonate (OS-CAL - DOSED IN MG OF ELEMENTAL CALCIUM) 1250 (500 Ca) MG tablet Take 1 tablet by mouth daily with breakfast.     Cholecalciferol (VITAMIN D3) 125 MCG (5000 UT) CAPS Take 5,000 Units by mouth 2 (two) times a day.     levothyroxine (SYNTHROID) 100 MCG tablet Take 1 tablet (100 mcg total) by mouth daily before breakfast.     loratadine (CLARITIN) 10 MG tablet Take 10 mg by mouth daily. Take to reduce risk of rash from everolimus.     metoprolol tartrate (LOPRESSOR) 25 MG tablet Take 0.5 tablets (12.5 mg total) by mouth 2 (two) times daily.     Multiple Vitamin (MULTIVITAMIN WITH MINERALS) TABS tablet Take 1 tablet by mouth daily.     pantoprazole (PROTONIX) 20 MG tablet TAKE 1 TABLET(20 MG) BY MOUTH DAILY (Patient taking differently: Take 20 mg by mouth daily.) 90 tablet 0   potassium chloride SA (KLOR-CON M) 20 MEQ tablet TAKE 1 TABLET BY MOUTH TWICE DAILY (Patient taking differently: Take 20 mEq by mouth 2 (two) times daily.) 60 tablet 3   Probiotic Product (ALIGN) 4 MG CAPS Take 1 capsule by  mouth daily.     rosuvastatin (CRESTOR) 5 MG tablet Take 1 tablet (5 mg total) by mouth daily. 90 tablet 3   traMADol (ULTRAM) 50 MG tablet Take 1 tablet (50 mg total) by mouth every 6 (six) hours as needed. 12 tablet 0   traZODone (DESYREL) 50 MG tablet Take 1 tablet (50 mg total) by mouth at bedtime.     No current facility-administered medications for this visit.    OBJECTIVE: Vitals:   08/15/22 0957  BP: (!) 117/57  Pulse: 82  Resp: 16  Temp: 98.3 F (36.8 C)  SpO2: 99%     Body mass index is 30.02 kg/m.    ECOG FS:2 - Symptomatic, <50% confined to bed  General: Well-developed, well-nourished, no acute distress.  Sitting in wheelchair.  Eyes: Pink conjunctiva, anicteric sclera. HEENT: Normocephalic, moist mucous membranes. Lungs: No audible wheezing or coughing. Heart: Regular rate and rhythm. Abdomen: Soft, nontender, no obvious distention. Musculoskeletal: 1-2+ peripheral edema.   Neuro: Alert, answering all questions appropriately. Cranial nerves grossly intact. Skin: No rashes or petechiae noted. Psych: Normal affect.   LAB RESULTS:  Lab Results  Component Value Date   NA 136 08/15/2022   K 3.6 08/15/2022   CL 103 08/15/2022   CO2 22 08/15/2022   GLUCOSE 124 (H) 08/15/2022   BUN 8 08/15/2022   CREATININE 1.13 (H) 08/15/2022   CALCIUM 9.3 08/15/2022   PROT 6.4 (L) 08/15/2022   ALBUMIN 3.1 (L) 08/15/2022   AST 31 08/15/2022   ALT 18 08/15/2022   ALKPHOS 117 08/15/2022   BILITOT 0.7 08/15/2022   GFRNONAA 51 (L) 08/15/2022   GFRAA 57 (L) 10/01/2019    Lab Results  Component Value Date   WBC 5.7 08/15/2022   NEUTROABS 4.5 08/15/2022   HGB 9.0 (L) 08/15/2022   HCT 29.5 (L) 08/15/2022   MCV 88.6 08/15/2022   PLT 243 08/15/2022     STUDIES: CT ABDOMEN PELVIS W CONTRAST  Result Date: 07/24/2022 CLINICAL DATA:  Sepsis. Stage IV carcinoma of the breast with bony Mets. EXAM: CT ABDOMEN AND PELVIS WITH CONTRAST TECHNIQUE: Multidetector CT imaging of the  abdomen and pelvis was performed using the standard protocol following bolus administration of intravenous contrast. RADIATION DOSE REDUCTION: This exam was performed according to the departmental dose-optimization program which includes automated exposure control, adjustment of the mA and/or kV according to patient size and/or use of iterative reconstruction technique. CONTRAST:  60mL OMNIPAQUE IOHEXOL 350 MG/ML SOLN COMPARISON:  PET-CT 03/29/2022. FINDINGS: Lower chest: There is dependent atelectasis in the bilateral lower lobes. There some patchy ground-glass opacities in the right middle lobe. There is a calcified granuloma in the right lower lobe. Hepatobiliary: No focal liver abnormality is seen. No gallstones, gallbladder wall thickening, or biliary dilatation. Pancreas: Unremarkable. No pancreatic ductal dilatation or surrounding inflammatory changes. Spleen: Normal in size without focal abnormality. Adrenals/Urinary Tract: There are 2 left renal cysts. The largest measures 5.6 cm. Otherwise, the kidneys, adrenal glands, and bladder are within normal limits. Stomach/Bowel: Stomach is within normal limits. Appendix appears normal. No evidence of bowel wall thickening, distention, or inflammatory changes. There is sigmoid colon diverticulosis. Vascular/Lymphatic: Aortic atherosclerosis. No enlarged abdominal or pelvic lymph nodes. Reproductive: Uterus and bilateral adnexa are unremarkable. Other: There is new presacral edema. There is no ascites or focal abdominal wall hernia. There is mild body wall edema. Small amount of subcutaneous air in the lower anterior abdominal wall is likely related to medication injection sites. Musculoskeletal: Osseous metastatic lesions are again noted most significant in the left iliac wing and L5 vertebral body similar to the prior study. IMPRESSION: 1. New presacral edema and mild body wall edema. 2. New patchy ground-glass opacities in the right middle lobe may represent  infectious/inflammatory process. 3. Stable osseous metastatic disease. 4. Left Bosniak I benign renal cyst measuring 5.6 cm. No follow-up imaging is recommended. JACR 2018 Feb; 264-273, Management of the Incidental Renal Mass on CT, [employer] 2021; 814-848, Bosniak Classification of Cystic Renal Masses, Version 2019. Aortic Atherosclerosis (ICD10-I70.0). Electronically Signed   By: Darliss Cheney M.D.   On: 07/24/2022 01:23   CT Angio Chest Pulmonary Embolism (PE) W or WO Contrast  Result Date: 07/24/2022 CLINICAL DATA:  Pulmonary embolism (PE) suspected, low to intermediate prob, neg D-dimer. Metastatic breast cancer.  History of pulmonary embolus. Generalized weakness EXAM: CT ANGIOGRAPHY CHEST WITH CONTRAST TECHNIQUE: Multidetector CT imaging of the chest was performed using the standard protocol during bolus administration of intravenous contrast. Multiplanar CT image reconstructions and MIPs were obtained to evaluate the vascular anatomy. RADIATION DOSE REDUCTION: This exam was performed according to the departmental dose-optimization program which includes automated exposure control, adjustment of the mA and/or kV according to patient size and/or use of iterative reconstruction technique. CONTRAST:  60mL OMNIPAQUE IOHEXOL 350 MG/ML SOLN COMPARISON:  PET CT 03/29/2022 FINDINGS: Cardiovascular: Heart is normal size. Aorta is normal caliber. Scattered aortic calcifications. No filling defects in the pulmonary arteries to suggest pulmonary emboli. Mediastinum/Nodes: No mediastinal, hilar, or axillary adenopathy. Trachea and esophagus are unremarkable. Thyroid unremarkable. Lungs/Pleura: Mild centrilobular emphysema. Dependent opacities in the lungs compatible with dependent atelectasis. Calcified granuloma in the right lower lobe. Airspace opacity noted in the posterior right upper lobe. Cannot exclude pneumonia. No effusions. Upper Abdomen: No acute findings Musculoskeletal: Prior left mastectomy. Chest wall  soft tissues are unremarkable. Bilateral scapular osseous lesions compatible with metastases. Review of the MIP images confirms the above findings. IMPRESSION: No evidence of pulmonary embolus. Dependent atelectasis. Airspace opacity noted in the posterior right upper lobe concerning for pneumonia. Osseous metastatic disease involving the scapulae. Aortic Atherosclerosis (ICD10-I70.0) and Emphysema (ICD10-J43.9). Electronically Signed   By: Charlett Nose M.D.   On: 07/24/2022 01:02   DG Chest Port 1 View  Result Date: 07/23/2022 CLINICAL DATA:  Sepsis. EXAM: PORTABLE CHEST 1 VIEW COMPARISON:  Chest radiograph dated 07/18/2022. FINDINGS: No focal consolidation, pleural effusion, pneumothorax. Mild diffuse chronic intra coarsening. The cardiac silhouette is within normal limits. No acute osseous pathology. IMPRESSION: No active disease. Electronically Signed   By: Elgie Collard M.D.   On: 07/23/2022 20:42   DG Chest Port 1 View  Result Date: 07/18/2022 CLINICAL DATA:  Shortness of breath.  History of breast cancer. EXAM: PORTABLE CHEST 1 VIEW COMPARISON:  X-ray 10/27/2019.  PET-CT scan 03/29/2022 FINDINGS: Overlapping cardiac leads. Normal cardiopericardial silhouette. No consolidation, pneumothorax or effusion. There is some linear opacity left lung base likely scar or atelectasis. Older rib fractures are identified. The subtle left upper lobe opacity corresponds to the finding by CT scan of some pleural thickening and interstitial septal thickening. Previous left mastectomy. IMPRESSION: No acute cardiopulmonary disease. Chronic changes. Mild left basilar scar or atelectasis. Electronically Signed   By: Karen Kays M.D.   On: 07/18/2022 17:24     ONCOLOGY HISTORY: Patient initially received neoadjuvant chemotherapy with Adriamycin and Cytoxan followed by weekly Taxol. She only received 4 cycles of weekly Taxol prior to discontinuation of treatment on October 31, 2017 secondary to persistent peripheral  neuropathy.  She ultimately required mastectomy and final pathology noted 5 of 6 lymph nodes positive for disease.  Patient completed adjuvant XRT in mid April 2020.  Pain initiated letrozole in May 2020, this was discontinued secondary to side effects and patient was started on anastrozole.  Nuclear medicine bone scan on June 19, 2019 revealed metastatic disease and anastrozole was subsequently discontinued.  PET scan results from October 02, 2021 revealed no clear evidence of metastatic progression.  PET scan results from June 30, 2019 reviewed independently with metastatic bony disease, but no obvious evidence of visceral disease.  MRI of the brain on August 24, 2019 reviewed independently with no obvious evidence of metastatic disease.   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  Patient CA 27-29 has been slowly increasing and her most recent result is 155.9.  PET scan results from March 29, 2022 reviewed independently with what appears to be progression of disease in her bones.  She continues to have no visceral disease.  After lengthy discussion, it was agreed upon that patient will switch treatments to Afinitor 10 mg daily along with Aromasin 25 mg daily.  Ibrance and Faslodex have been discontinued.  [name] has been permanently discontinued given her issues with her mandible.  Patient has been instructed to continue to hold her treatment until her performance status has improved.  Return to clinic in 4 weeks for further evaluation and consideration of reinitiating treatment.   Diarrhea: Patient does not complain of this today.  Continue Imodium as needed. Peripheral neuropathy: Chronic and unchanged. History of pulmonary embolus: Patient was diagnosed with a small pulmonary embolus on January 10, 2018.  She is no  longer on anticoagulation.  There was no evidence of recurrent PE on CT scan from October 27, 2019. Osteopenia: Patient's most recent bone mineral density on March 03, 2019 reported a T score of -1.8 which is only mildly decreased from 1 year prior when the reported T score was -1.6.  Continue calcium and vitamin D supplementation.  Patient no longer will receive bisphosphonates.   Left hip/flank pain: Patient does not complain of pain today.  Continue tramadol as prescribed.  She has now completed XRT.   Leukopenia: Resolved.  Anemia: He globin decreased, but stable at 9.0. Renal insufficiency: Improved.  Monitor.   Hypokalemia: Resolved.  Continue oral potassium supplementation. Dental pathology: Significantly improved.  Continue follow up with Dr. Willaim Bane.  Patient has not doing hyperbaric treatments at [facility] at this point.   Transaminitis: Resolved.   Patient expressed understanding and was in agreement with this plan. She also understands that She can call clinic at any time with any questions, concerns, or complaints.    Cancer Staging  Breast cancer, stage 2, left (HCC) Staging form: Breast, AJCC 8th Edition - Clinical stage from 07/30/2017: Stage IIA (cT2, cN1, cM0, G2, ER+, PR+, HER2-) - Signed by Jeralyn Ruths, MD on 07/30/2017 Histologic grading system: 3 grade system Laterality: Left   Jeralyn Ruths, MD   08/15/2022 2:08 PM

## 2022-08-15 NOTE — Progress Notes (Signed)
Patient states she's retaining fluid in left arm. Patient also is having swelling in feet and legs.

## 2022-08-16 ENCOUNTER — Other Ambulatory Visit: Payer: Self-pay

## 2022-08-16 DIAGNOSIS — I10 Essential (primary) hypertension: Secondary | ICD-10-CM | POA: Diagnosis not present

## 2022-08-16 DIAGNOSIS — L89153 Pressure ulcer of sacral region, stage 3: Secondary | ICD-10-CM | POA: Diagnosis not present

## 2022-08-16 DIAGNOSIS — Z6830 Body mass index (BMI) 30.0-30.9, adult: Secondary | ICD-10-CM | POA: Diagnosis not present

## 2022-08-16 DIAGNOSIS — R600 Localized edema: Secondary | ICD-10-CM | POA: Diagnosis not present

## 2022-08-16 DIAGNOSIS — N183 Chronic kidney disease, stage 3 unspecified: Secondary | ICD-10-CM | POA: Diagnosis not present

## 2022-08-16 DIAGNOSIS — E440 Moderate protein-calorie malnutrition: Secondary | ICD-10-CM | POA: Diagnosis not present

## 2022-08-16 DIAGNOSIS — R197 Diarrhea, unspecified: Secondary | ICD-10-CM | POA: Diagnosis not present

## 2022-08-16 DIAGNOSIS — R000 Tachycardia, unspecified: Secondary | ICD-10-CM | POA: Diagnosis not present

## 2022-08-21 DIAGNOSIS — I10 Essential (primary) hypertension: Secondary | ICD-10-CM | POA: Diagnosis not present

## 2022-08-21 DIAGNOSIS — R600 Localized edema: Secondary | ICD-10-CM | POA: Diagnosis not present

## 2022-08-23 DIAGNOSIS — I10 Essential (primary) hypertension: Secondary | ICD-10-CM | POA: Diagnosis not present

## 2022-08-23 DIAGNOSIS — E440 Moderate protein-calorie malnutrition: Secondary | ICD-10-CM | POA: Diagnosis not present

## 2022-08-23 DIAGNOSIS — N183 Chronic kidney disease, stage 3 unspecified: Secondary | ICD-10-CM | POA: Diagnosis not present

## 2022-08-23 DIAGNOSIS — R600 Localized edema: Secondary | ICD-10-CM | POA: Diagnosis not present

## 2022-08-23 DIAGNOSIS — Z6830 Body mass index (BMI) 30.0-30.9, adult: Secondary | ICD-10-CM | POA: Diagnosis not present

## 2022-08-23 DIAGNOSIS — L89153 Pressure ulcer of sacral region, stage 3: Secondary | ICD-10-CM | POA: Diagnosis not present

## 2022-08-27 DIAGNOSIS — L03114 Cellulitis of left upper limb: Secondary | ICD-10-CM | POA: Diagnosis not present

## 2022-08-27 DIAGNOSIS — R600 Localized edema: Secondary | ICD-10-CM | POA: Diagnosis not present

## 2022-08-28 DIAGNOSIS — R600 Localized edema: Secondary | ICD-10-CM | POA: Diagnosis not present

## 2022-08-28 DIAGNOSIS — L03114 Cellulitis of left upper limb: Secondary | ICD-10-CM | POA: Diagnosis not present

## 2022-08-28 DIAGNOSIS — E876 Hypokalemia: Secondary | ICD-10-CM | POA: Diagnosis not present

## 2022-08-30 DIAGNOSIS — L89153 Pressure ulcer of sacral region, stage 3: Secondary | ICD-10-CM | POA: Diagnosis not present

## 2022-08-30 DIAGNOSIS — E440 Moderate protein-calorie malnutrition: Secondary | ICD-10-CM | POA: Diagnosis not present

## 2022-08-30 DIAGNOSIS — Z6830 Body mass index (BMI) 30.0-30.9, adult: Secondary | ICD-10-CM | POA: Diagnosis not present

## 2022-08-30 DIAGNOSIS — N183 Chronic kidney disease, stage 3 unspecified: Secondary | ICD-10-CM | POA: Diagnosis not present

## 2022-09-05 ENCOUNTER — Other Ambulatory Visit: Payer: Self-pay

## 2022-09-05 ENCOUNTER — Inpatient Hospital Stay: Payer: Medicare Other | Attending: Oncology | Admitting: Medical Oncology

## 2022-09-05 ENCOUNTER — Telehealth: Payer: Self-pay | Admitting: *Deleted

## 2022-09-05 ENCOUNTER — Inpatient Hospital Stay: Payer: Medicare Other

## 2022-09-05 ENCOUNTER — Ambulatory Visit: Payer: Medicare Other | Admitting: Occupational Therapy

## 2022-09-05 ENCOUNTER — Ambulatory Visit
Admission: RE | Admit: 2022-09-05 | Discharge: 2022-09-05 | Disposition: A | Discharge status: Home / Self Care | Payer: Medicare Other | Source: Ambulatory Visit | Attending: Medical Oncology | Admitting: Medical Oncology

## 2022-09-05 ENCOUNTER — Encounter: Payer: Self-pay | Admitting: Medical Oncology

## 2022-09-05 VITALS — BP 134/68 | HR 81 | Temp 98.9°F | Resp 20 | Ht 66.0 in | Wt 190.0 lb

## 2022-09-05 DIAGNOSIS — Z923 Personal history of irradiation: Secondary | ICD-10-CM | POA: Insufficient documentation

## 2022-09-05 DIAGNOSIS — Z170 Estrogen receptor positive status [ER+]: Secondary | ICD-10-CM | POA: Insufficient documentation

## 2022-09-05 DIAGNOSIS — M6281 Muscle weakness (generalized): Secondary | ICD-10-CM | POA: Diagnosis not present

## 2022-09-05 DIAGNOSIS — L03114 Cellulitis of left upper limb: Secondary | ICD-10-CM | POA: Diagnosis not present

## 2022-09-05 DIAGNOSIS — Z87891 Personal history of nicotine dependence: Secondary | ICD-10-CM | POA: Insufficient documentation

## 2022-09-05 DIAGNOSIS — Z9012 Acquired absence of left breast and nipple: Secondary | ICD-10-CM | POA: Diagnosis not present

## 2022-09-05 DIAGNOSIS — I890 Lymphedema, not elsewhere classified: Secondary | ICD-10-CM

## 2022-09-05 DIAGNOSIS — E876 Hypokalemia: Secondary | ICD-10-CM | POA: Diagnosis not present

## 2022-09-05 DIAGNOSIS — M79602 Pain in left arm: Secondary | ICD-10-CM | POA: Diagnosis not present

## 2022-09-05 DIAGNOSIS — C7951 Secondary malignant neoplasm of bone: Secondary | ICD-10-CM | POA: Insufficient documentation

## 2022-09-05 DIAGNOSIS — C50912 Malignant neoplasm of unspecified site of left female breast: Secondary | ICD-10-CM | POA: Insufficient documentation

## 2022-09-05 DIAGNOSIS — R21 Rash and other nonspecific skin eruption: Secondary | ICD-10-CM | POA: Insufficient documentation

## 2022-09-05 DIAGNOSIS — R600 Localized edema: Secondary | ICD-10-CM | POA: Diagnosis not present

## 2022-09-05 DIAGNOSIS — J189 Pneumonia, unspecified organism: Secondary | ICD-10-CM | POA: Diagnosis not present

## 2022-09-05 DIAGNOSIS — I2699 Other pulmonary embolism without acute cor pulmonale: Secondary | ICD-10-CM | POA: Diagnosis not present

## 2022-09-05 LAB — CMP (CANCER CENTER ONLY)
ALT: 29 U/L (ref 0–44)
AST: 40 U/L (ref 15–41)
Albumin: 3.3 g/dL — ABNORMAL LOW (ref 3.5–5.0)
Alkaline Phosphatase: 148 U/L — ABNORMAL HIGH (ref 38–126)
Anion gap: 8 (ref 5–15)
BUN: 17 mg/dL (ref 8–23)
CO2: 24 mmol/L (ref 22–32)
Calcium: 9 mg/dL (ref 8.9–10.3)
Chloride: 103 mmol/L (ref 98–111)
Creatinine: 1.11 mg/dL — ABNORMAL HIGH (ref 0.44–1.00)
GFR, Estimated: 52 mL/min — ABNORMAL LOW (ref >60)
Glucose, Bld: 111 mg/dL — ABNORMAL HIGH (ref 70–99)
Potassium: 3.9 mmol/L (ref 3.5–5.1)
Sodium: 135 mmol/L (ref 135–145)
Total Bilirubin: 0.3 mg/dL (ref 0.3–1.2)
Total Protein: 6.4 g/dL — ABNORMAL LOW (ref 6.5–8.1)

## 2022-09-05 LAB — CBC WITH DIFFERENTIAL (CANCER CENTER ONLY)
Abs Immature Granulocytes: 0.1 10*3/uL — ABNORMAL HIGH (ref 0.00–0.07)
Basophils Absolute: 0 10*3/uL (ref 0.0–0.1)
Basophils Relative: 1 %
Eosinophils Absolute: 0.2 10*3/uL (ref 0.0–0.5)
Eosinophils Relative: 3 %
HCT: 27.9 % — ABNORMAL LOW (ref 36.0–46.0)
Hemoglobin: 8.7 g/dL — ABNORMAL LOW (ref 12.0–15.0)
Immature Granulocytes: 2 %
Lymphocytes Relative: 8 %
Lymphs Abs: 0.4 10*3/uL — ABNORMAL LOW (ref 0.7–4.0)
MCH: 29.4 pg (ref 26.0–34.0)
MCHC: 31.2 g/dL (ref 30.0–36.0)
MCV: 94.3 fL (ref 80.0–100.0)
Monocytes Absolute: 0.8 10*3/uL (ref 0.1–1.0)
Monocytes Relative: 16 %
Neutro Abs: 3.4 10*3/uL (ref 1.7–7.7)
Neutrophils Relative %: 70 %
Platelet Count: 298 10*3/uL (ref 150–400)
RBC: 2.96 MIL/uL — ABNORMAL LOW (ref 3.87–5.11)
RDW: 23.2 % — ABNORMAL HIGH (ref 11.5–15.5)
WBC Count: 4.9 10*3/uL (ref 4.0–10.5)
nRBC: 0 % (ref 0.0–0.2)

## 2022-09-05 MED ORDER — DOXYCYCLINE HYCLATE 100 MG PO TABS: 100.0000 mg | ORAL_TABLET | Freq: Two times a day (BID) | ORAL | 0 refills | Status: AC

## 2022-09-05 NOTE — Telephone Encounter (Signed)
Patient called reporting that she has fluid swelling in her left arm and hand and is asking what can be done for it. Pleasa advise

## 2022-09-05 NOTE — Telephone Encounter (Signed)
Left vm for patient - to return my phone call. We can see patient today for 130 for OT screen with Marisue Humble and [facility] at 2pm

## 2022-09-05 NOTE — Progress Notes (Signed)
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  ONCOLOGY HISTORY: Patient initially received neoadjuvant chemotherapy with Adriamycin and Cytoxan followed by weekly Taxol. She only received 4 cycles of weekly Taxol prior to discontinuation of treatment on October 31, 2017 secondary to persistent peripheral neuropathy. She ultimately required mastectomy and final pathology noted 5 of 6 lymph nodes positive for disease. Patient completed adjuvant XRT in mid April 2020. Pain initiated letrozole in May 2020, this was discontinued secondary to side effects and patient was started on anastrozole. Nuclear medicine bone scan on June 19, 2019 revealed metastatic disease and anastrozole was subsequently discontinued. PET scan results from October 02, 2021 revealed no clear evidence of metastatic progression. PET scan results from June 30, 2019 reviewed independently with metastatic bony disease, but no obvious evidence of visceral disease. MRI of the brain on August 24, 2019 reviewed independently with no obvious evidence of metastatic disease.   INTERVAL HISTORY: Patient is here to discuss left arm swelling and pain. She reports that her arm has had lymphedema for quite some time secondary to her breast surgeries. Recently over the past 3 weeks she has noticed worsened lymphedema as well as redness. She is currently at [facility] rehabilitation following a recent  hospitalization for altered mental status. She reports that an [place] was performed which did not show evidence of a clot. She was trailed on ABX which she finished last Saturday. The area initially improved but is now much worse. She can feel heat when pressing on the hand and has noticed some numbness and tingling sensation of the arm. No chest pain, calf pain. She does report some chills but no recorded fever.   REVIEW OF SYSTEMS:   Review of Systems  Constitutional:  Positive for malaise/fatigue. Negative for fever and weight loss.  HENT:  Negative for congestion.   Respiratory: Negative.  Negative for cough and shortness of breath.   Cardiovascular:  Positive for leg swelling. Negative for chest pain.  Gastrointestinal: Negative.  Negative for abdominal pain, constipation, diarrhea and nausea.  Genitourinary: Negative.  Negative for dysuria, flank pain and urgency.  Musculoskeletal: Negative.  Negative for back pain and joint pain.  Skin: Negative.  Negative for rash.  Neurological:  Positive for tingling, sensory change and weakness. Negative for dizziness, focal weakness and headaches.  Psychiatric/Behavioral:  The patient is not nervous/anxious.     As per HPI. Otherwise, a complete review of systems is negative.  PAST MEDICAL HISTORY: Past Medical History:  Diagnosis Date   Anxiety    Breast cancer, left (HCC) 07/2017   Depression    Hypertension    Hypothyroidism    Personal history of chemotherapy 2019   LEFT mastectomy-chemo before   Personal history of radiation therapy 03/2018   LEFT mastectomy   Rapid heart rate    Thyroid disease     PAST SURGICAL HISTORY: Past Surgical History:  Procedure Laterality Date   AXILLARY LYMPH NODE BIOPSY Left 07/16/2017   METASTATIC MAMMARY CARCINOMA   BREAST BIOPSY Left 07/16/2017   [place]  bx of left breast mass and left breast LN.  INVASIVE MAMMARY CARCINOMA, NO SPECIAL TYPE.    BREAST EXCISIONAL BIOPSY Right 2001   benign   BREAST  LUMPECTOMY WITH SENTINEL LYMPH NODE BIOPSY Left 12/06/2017   Procedure: BREAST LUMPECTOMY WITH SENTINEL LYMPH NODE BX;  Surgeon: Earline Mayotte, MD;  Location: [facility] ORS;  Service: General;  Laterality: Left;   COLONOSCOPY     MASTECTOMY Left 12/23/2017   PORTACATH PLACEMENT Right 08/07/2017   Procedure: INSERTION PORT-A-CATH;  Surgeon: Earline Mayotte, MD;  Location: [facility] ORS;  Service: General;  Laterality: Right;   SIMPLE MASTECTOMY WITH AXILLARY SENTINEL NODE BIOPSY Left 12/23/2017   T2,N2 with 6/7 nodes positive. Whole breast radiation.  Surgeon: Earline Mayotte, MD;  Location: [facility] ORS;  Service: General;  Laterality: Left;   TONSILLECTOMY      FAMILY HISTORY: Family History  Problem Relation Age of Onset   Stroke Mother    Thyroid disease Mother    Renal Disease Mother    Stroke Father    Heart attack Father    Sudden death Father 29       suicide   Anuerysm Brother    Breast cancer Neg Hx     ADVANCED DIRECTIVES (Y/N):  N  HEALTH MAINTENANCE: Social History   Tobacco Use   Smoking status: Former    Current packs/day: 0.00    Average packs/day: 1 pack/day for 30.0 years (30.0 ttl pk-yrs)    Types: Cigarettes    Start date: 60    Quit date: 2019    Years since quitting: 5.6   Smokeless tobacco: Never  Vaping Use   Vaping status: Never Used  Substance Use Topics   Alcohol use: Not Currently   Drug use: Never    Allergies  Allergen Reactions   Sulfa Antibiotics Diarrhea    Current Outpatient Medications  Medication Sig Dispense Refill   acetaminophen (TYLENOL) 500 MG tablet Take 1,000 mg by mouth every 6 (six) hours as needed for moderate pain or headache.      ALPRAZolam (XANAX) 0.5 MG tablet Take 1 tablet (0.5 mg total) by mouth 2 (two) times daily as needed for anxiety. 10 tablet 0   Amino Acids-Protein Hydrolys (PRO-STAT SUGAR FREE PO) Take 30 mLs by mouth 2 (two) times daily.     amLODipine (NORVASC) 5 MG tablet Take 1 tablet (5 mg total) by  mouth daily. 90 tablet 3   aspirin EC 81 MG tablet Take 81 mg by mouth at bedtime.      calcium carbonate (OS-CAL - DOSED IN MG OF ELEMENTAL CALCIUM) 1250 (500 Ca) MG tablet Take 1 tablet by mouth daily with breakfast.     Cholecalciferol (VITAMIN D3) 125 MCG (5000 UT) CAPS Take 5,000 Units by mouth 2 (two) times a day.     doxycycline (VIBRA-TABS) 100 MG tablet Take 1 tablet (100 mg total) by mouth 2 (two) times daily for 10 days. 20 tablet 0   levothyroxine (SYNTHROID) 100 MCG tablet Take 1 tablet (100 mcg total) by mouth daily before breakfast.     loratadine (CLARITIN) 10 MG tablet Take 10 mg by mouth daily. Take to reduce risk of rash from everolimus.     metoprolol tartrate (LOPRESSOR) 25 MG tablet Take 0.5 tablets (12.5 mg total) by mouth 2 (two) times daily.     Multiple Vitamin (MULTIVITAMIN WITH MINERALS) TABS tablet Take 1 tablet by mouth daily.     pantoprazole (PROTONIX) 20 MG tablet TAKE  1 TABLET(20 MG) BY MOUTH DAILY (Patient taking differently: Take 20 mg by mouth daily.) 90 tablet 0   potassium chloride SA (KLOR-CON M) 20 MEQ tablet TAKE 1 TABLET BY MOUTH TWICE DAILY (Patient taking differently: Take 20 mEq by mouth 2 (two) times daily.) 60 tablet 3   Probiotic Product (ALIGN) 4 MG CAPS Take 1 capsule by mouth daily.     rosuvastatin (CRESTOR) 5 MG tablet Take 1 tablet (5 mg total) by mouth daily. 90 tablet 3   Sennosides-Docusate Sodium (SENNA PLUS PO) Take 1 capsule by mouth as needed (constipation).     traZODone (DESYREL) 50 MG tablet Take 1 tablet (50 mg total) by mouth at bedtime.     traMADol (ULTRAM) 50 MG tablet Take 1 tablet (50 mg total) by mouth every 6 (six) hours as needed. (Patient not taking: Reported on 09/05/2022) 12 tablet 0   No current facility-administered medications for this visit.    OBJECTIVE: Vitals:   09/05/22 1344  BP: 134/68  Pulse: 81  Resp: 20  Temp: 98.9 F (37.2 C)     Body mass index is 30.67 kg/m.    ECOG FS:2 - Symptomatic, <50%  confined to bed  General: Well-developed, well-nourished, no acute distress.   Eyes: Pink conjunctiva, anicteric sclera. HEENT: Normocephalic, moist mucous membranes. Lungs: No audible wheezing or coughing. Heart: Regular rate and rhythm. Abdomen: Soft, nontender, no obvious distention. Musculoskeletal: 1-2+ peripheral edema.   Neuro: Alert, answering all questions appropriately. Cranial nerves grossly intact. Skin: See below Psych: Normal affect.   LAB RESULTS:  Lab Results  Component Value Date   NA 135 09/05/2022   K 3.9 09/05/2022   CL 103 09/05/2022   CO2 24 09/05/2022   GLUCOSE 111 (H) 09/05/2022   BUN 17 09/05/2022   CREATININE 1.11 (H) 09/05/2022   CALCIUM 9.0 09/05/2022   PROT 6.4 (L) 09/05/2022   ALBUMIN 3.3 (L) 09/05/2022   AST 40 09/05/2022   ALT 29 09/05/2022   ALKPHOS 148 (H) 09/05/2022   BILITOT 0.3 09/05/2022   GFRNONAA 52 (L) 09/05/2022   GFRAA 57 (L) 10/01/2019    Lab Results  Component Value Date   WBC 4.9 09/05/2022   NEUTROABS 3.4 09/05/2022   HGB 8.7 (L) 09/05/2022   HCT 27.9 (L) 09/05/2022   MCV 94.3 09/05/2022   PLT 298 09/05/2022     STUDIES: US Venous Img Upper Uni Left  Result Date: 09/05/2022 CLINICAL DATA:  Left upper extremity pain and edema for the past 2-3 weeks. History of pulmonary embolism. Evaluate for DVT. EXAM: LEFT UPPER EXTREMITY VENOUS DOPPLER ULTRASOUND TECHNIQUE: Gray-scale sonography with graded compression, as well as color Doppler and duplex ultrasound were performed to evaluate the upper extremity deep venous system from the level of the subclavian vein and including the jugular, axillary, basilic, radial, ulnar and upper cephalic vein. Spectral Doppler was utilized to evaluate flow at rest and with distal augmentation maneuvers. COMPARISON:  None Available. FINDINGS: Contralateral Subclavian Vein: Respiratory phasicity is normal and symmetric with the symptomatic side. No evidence of thrombus. Normal compressibility.  Internal Jugular Vein: No evidence of thrombus. Normal compressibility, respiratory phasicity and response to augmentation. Subclavian Vein: No evidence of thrombus. Normal compressibility, respiratory phasicity and response to augmentation. Axillary Vein: No evidence of thrombus. Normal compressibility, respiratory phasicity and response to augmentation. Cephalic Vein: No evidence of thrombus. Normal compressibility, respiratory phasicity and response to augmentation. Basilic Vein: No evidence of thrombus. Normal compressibility, respiratory phasicity and response to augmentation. Brachial  Veins: No evidence of thrombus. Normal compressibility, respiratory phasicity and response to augmentation. Radial Veins: No evidence of thrombus. Normal compressibility, respiratory phasicity and response to augmentation. Ulnar Veins: No evidence of thrombus. Normal compressibility, respiratory phasicity and response to augmentation. Other Findings:  None visualized. IMPRESSION: No evidence of DVT within the left upper extremity. Electronically Signed   By: Simonne Come M.D.   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 Here with concerns regarding LUE edema and redness   PLAN:    LUE swelling: Acute on chronic. Concerning for DVT vs cellulitis. I do not have the doppler report from [facility]. Edema and redness has worsened since this time. I have recommended repeat imaging which patient is agreeable with. If negative I would like to treat her for cellulitis with doxycyline.  Rash of hands was visible while assessing LUE swelling. She has had multiple events of mental status changes. I do not see any syphilis testing having been performed. Will obtain today.   Disposition STAT LUE doppler  Referring to OT- Lymphedema clinic Starting on Doxycyline RTC 7 days APP- no labs    Patient expressed understanding and was in agreement with this  plan. She also understands that She can call clinic at any time with any questions, concerns, or complaints.    Cancer Staging  Breast cancer, stage 2, left (HCC) Staging form: Breast, AJCC 8th Edition - Clinical stage from 07/30/2017: Stage IIA (cT2, cN1, cM0, G2, ER+, PR+, HER2-) - Signed by Jeralyn Ruths, MD on 07/30/2017 Histologic grading system: 3 grade system Laterality: Left   Rushie Chestnut, PA-C   09/05/2022 4:12 PM

## 2022-09-06 ENCOUNTER — Other Ambulatory Visit: Payer: Self-pay | Admitting: Oncology

## 2022-09-08 DIAGNOSIS — E039 Hypothyroidism, unspecified: Secondary | ICD-10-CM | POA: Diagnosis not present

## 2022-09-08 DIAGNOSIS — A419 Sepsis, unspecified organism: Secondary | ICD-10-CM | POA: Diagnosis not present

## 2022-09-08 DIAGNOSIS — Z85830 Personal history of malignant neoplasm of bone: Secondary | ICD-10-CM | POA: Diagnosis not present

## 2022-09-08 DIAGNOSIS — Z86711 Personal history of pulmonary embolism: Secondary | ICD-10-CM | POA: Diagnosis not present

## 2022-09-08 DIAGNOSIS — L03116 Cellulitis of left lower limb: Secondary | ICD-10-CM | POA: Diagnosis not present

## 2022-09-08 DIAGNOSIS — I719 Aortic aneurysm of unspecified site, without rupture: Secondary | ICD-10-CM | POA: Diagnosis not present

## 2022-09-08 DIAGNOSIS — Z853 Personal history of malignant neoplasm of breast: Secondary | ICD-10-CM | POA: Diagnosis not present

## 2022-09-08 DIAGNOSIS — N390 Urinary tract infection, site not specified: Secondary | ICD-10-CM | POA: Diagnosis not present

## 2022-09-08 DIAGNOSIS — Z5982 Transportation insecurity: Secondary | ICD-10-CM | POA: Diagnosis not present

## 2022-09-08 DIAGNOSIS — J189 Pneumonia, unspecified organism: Secondary | ICD-10-CM | POA: Diagnosis not present

## 2022-09-08 DIAGNOSIS — M6259 Muscle wasting and atrophy, not elsewhere classified, multiple sites: Secondary | ICD-10-CM | POA: Diagnosis not present

## 2022-09-08 DIAGNOSIS — E876 Hypokalemia: Secondary | ICD-10-CM | POA: Diagnosis not present

## 2022-09-08 DIAGNOSIS — G4733 Obstructive sleep apnea (adult) (pediatric): Secondary | ICD-10-CM | POA: Diagnosis not present

## 2022-09-08 DIAGNOSIS — F32A Depression, unspecified: Secondary | ICD-10-CM | POA: Diagnosis not present

## 2022-09-08 DIAGNOSIS — F419 Anxiety disorder, unspecified: Secondary | ICD-10-CM | POA: Diagnosis not present

## 2022-09-08 DIAGNOSIS — L03115 Cellulitis of right lower limb: Secondary | ICD-10-CM | POA: Diagnosis not present

## 2022-09-08 DIAGNOSIS — Z556 Problems related to health literacy: Secondary | ICD-10-CM | POA: Diagnosis not present

## 2022-09-08 DIAGNOSIS — I10 Essential (primary) hypertension: Secondary | ICD-10-CM | POA: Diagnosis not present

## 2022-09-10 ENCOUNTER — Telehealth: Payer: Self-pay | Admitting: *Deleted

## 2022-09-10 DIAGNOSIS — J189 Pneumonia, unspecified organism: Secondary | ICD-10-CM | POA: Diagnosis not present

## 2022-09-10 DIAGNOSIS — G4733 Obstructive sleep apnea (adult) (pediatric): Secondary | ICD-10-CM | POA: Diagnosis not present

## 2022-09-10 DIAGNOSIS — I10 Essential (primary) hypertension: Secondary | ICD-10-CM | POA: Diagnosis not present

## 2022-09-10 DIAGNOSIS — I719 Aortic aneurysm of unspecified site, without rupture: Secondary | ICD-10-CM | POA: Diagnosis not present

## 2022-09-10 DIAGNOSIS — A419 Sepsis, unspecified organism: Secondary | ICD-10-CM | POA: Diagnosis not present

## 2022-09-10 DIAGNOSIS — N390 Urinary tract infection, site not specified: Secondary | ICD-10-CM | POA: Diagnosis not present

## 2022-09-10 NOTE — Telephone Encounter (Signed)
Physical Therapist asking for orders for 1 wk 9

## 2022-09-11 ENCOUNTER — Other Ambulatory Visit (HOSPITAL_COMMUNITY): Payer: Self-pay

## 2022-09-11 ENCOUNTER — Encounter: Payer: Self-pay | Admitting: Oncology

## 2022-09-11 NOTE — Telephone Encounter (Signed)
Call returned to [facility] and order approval given for therapy

## 2022-09-12 ENCOUNTER — Encounter: Payer: Self-pay | Admitting: Medical Oncology

## 2022-09-12 ENCOUNTER — Other Ambulatory Visit: Payer: Self-pay

## 2022-09-12 ENCOUNTER — Inpatient Hospital Stay (HOSPITAL_BASED_OUTPATIENT_CLINIC_OR_DEPARTMENT_OTHER): Payer: Medicare Other | Admitting: Medical Oncology

## 2022-09-12 ENCOUNTER — Ambulatory Visit: Payer: Medicare Other | Admitting: Medical Oncology

## 2022-09-12 ENCOUNTER — Inpatient Hospital Stay: Payer: Medicare Other

## 2022-09-12 VITALS — BP 96/43 | HR 76 | Temp 97.7°F | Wt 200.9 lb

## 2022-09-12 DIAGNOSIS — A419 Sepsis, unspecified organism: Secondary | ICD-10-CM | POA: Diagnosis not present

## 2022-09-12 DIAGNOSIS — G4733 Obstructive sleep apnea (adult) (pediatric): Secondary | ICD-10-CM | POA: Diagnosis not present

## 2022-09-12 DIAGNOSIS — I890 Lymphedema, not elsewhere classified: Secondary | ICD-10-CM

## 2022-09-12 DIAGNOSIS — J189 Pneumonia, unspecified organism: Secondary | ICD-10-CM | POA: Diagnosis not present

## 2022-09-12 DIAGNOSIS — C50912 Malignant neoplasm of unspecified site of left female breast: Secondary | ICD-10-CM

## 2022-09-12 DIAGNOSIS — C7951 Secondary malignant neoplasm of bone: Secondary | ICD-10-CM | POA: Diagnosis not present

## 2022-09-12 DIAGNOSIS — R600 Localized edema: Secondary | ICD-10-CM

## 2022-09-12 DIAGNOSIS — I10 Essential (primary) hypertension: Secondary | ICD-10-CM | POA: Diagnosis not present

## 2022-09-12 DIAGNOSIS — Z170 Estrogen receptor positive status [ER+]: Secondary | ICD-10-CM | POA: Diagnosis not present

## 2022-09-12 DIAGNOSIS — C50919 Malignant neoplasm of unspecified site of unspecified female breast: Secondary | ICD-10-CM

## 2022-09-12 DIAGNOSIS — I719 Aortic aneurysm of unspecified site, without rupture: Secondary | ICD-10-CM | POA: Diagnosis not present

## 2022-09-12 DIAGNOSIS — Z923 Personal history of irradiation: Secondary | ICD-10-CM | POA: Diagnosis not present

## 2022-09-12 DIAGNOSIS — N390 Urinary tract infection, site not specified: Secondary | ICD-10-CM | POA: Diagnosis not present

## 2022-09-12 DIAGNOSIS — Z9012 Acquired absence of left breast and nipple: Secondary | ICD-10-CM | POA: Diagnosis not present

## 2022-09-12 LAB — CBC WITH DIFFERENTIAL (CANCER CENTER ONLY)
Abs Immature Granulocytes: 0.04 10*3/uL (ref 0.00–0.07)
Basophils Absolute: 0 10*3/uL (ref 0.0–0.1)
Basophils Relative: 1 %
Eosinophils Absolute: 0.1 10*3/uL (ref 0.0–0.5)
Eosinophils Relative: 3 %
HCT: 27.9 % — ABNORMAL LOW (ref 36.0–46.0)
Hemoglobin: 8.7 g/dL — ABNORMAL LOW (ref 12.0–15.0)
Immature Granulocytes: 1 %
Lymphocytes Relative: 9 %
Lymphs Abs: 0.4 10*3/uL — ABNORMAL LOW (ref 0.7–4.0)
MCH: 29.4 pg (ref 26.0–34.0)
MCHC: 31.2 g/dL (ref 30.0–36.0)
MCV: 94.3 fL (ref 80.0–100.0)
Monocytes Absolute: 0.6 10*3/uL (ref 0.1–1.0)
Monocytes Relative: 14 %
Neutro Abs: 3 10*3/uL (ref 1.7–7.7)
Neutrophils Relative %: 72 %
Platelet Count: 281 10*3/uL (ref 150–400)
RBC: 2.96 MIL/uL — ABNORMAL LOW (ref 3.87–5.11)
RDW: 22.3 % — ABNORMAL HIGH (ref 11.5–15.5)
WBC Count: 4.2 10*3/uL (ref 4.0–10.5)
nRBC: 0 % (ref 0.0–0.2)

## 2022-09-12 LAB — CMP (CANCER CENTER ONLY)
ALT: 16 U/L (ref 0–44)
AST: 23 U/L (ref 15–41)
Albumin: 3.4 g/dL — ABNORMAL LOW (ref 3.5–5.0)
Alkaline Phosphatase: 130 U/L — ABNORMAL HIGH (ref 38–126)
Anion gap: 8 (ref 5–15)
BUN: 19 mg/dL (ref 8–23)
CO2: 25 mmol/L (ref 22–32)
Calcium: 9.3 mg/dL (ref 8.9–10.3)
Chloride: 102 mmol/L (ref 98–111)
Creatinine: 1.18 mg/dL — ABNORMAL HIGH (ref 0.44–1.00)
GFR, Estimated: 49 mL/min — ABNORMAL LOW (ref >60)
Glucose, Bld: 124 mg/dL — ABNORMAL HIGH (ref 70–99)
Potassium: 4.2 mmol/L (ref 3.5–5.1)
Sodium: 135 mmol/L (ref 135–145)
Total Bilirubin: 0.5 mg/dL (ref 0.3–1.2)
Total Protein: 6.3 g/dL — ABNORMAL LOW (ref 6.5–8.1)

## 2022-09-12 NOTE — Progress Notes (Signed)
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  ONCOLOGY HISTORY: Patient initially received neoadjuvant chemotherapy with Adriamycin and Cytoxan followed by weekly Taxol. She only received 4 cycles of weekly Taxol prior to discontinuation of treatment on October 31, 2017 secondary to persistent peripheral neuropathy. She ultimately required mastectomy and final pathology noted 5 of 6 lymph nodes positive for disease. Patient completed adjuvant XRT in mid April 2020. Pain initiated letrozole in May 2020, this was discontinued secondary to side effects and patient was started on anastrozole. Nuclear medicine bone scan on June 19, 2019 revealed metastatic disease and anastrozole was subsequently discontinued. PET scan results from October 02, 2021 revealed no clear evidence of metastatic progression. PET scan results from June 30, 2019 reviewed independently with metastatic bony disease, but no obvious evidence of visceral disease. MRI of the brain on August 24, 2019 reviewed independently with no obvious evidence of metastatic disease.   INTERVAL HISTORY: Patient is here to follow up on left arm swelling and pain. She has had lymphedema for quite some time secondary to her breast surgeries/lymph node removal. After being in the hospital for altered mental status/dehydration/AKI and rehabilitation, the arm swelling and pain worsened. At her last visit on  09/05/2022 a doppler was performed which showed no evidence of DVT of arm. She was started on a 10 day course of doxycyline.   Today she reports that the swelling is about the same, if not worse. She as also developed gradually worsening peripheral edema since her rehabilitation facility switched her off of hydrochlorothiazide 12.5 mg onto lasix (unknown dose). She denies SOB but is having trouble ambulating due to the swelling and discomfort. She is unsure about weight changes. She has been fluid restricting.    REVIEW OF SYSTEMS:   Review of Systems  Constitutional:  Positive for malaise/fatigue. Negative for fever and weight loss.  HENT:  Negative for congestion.   Respiratory: Negative.  Negative for cough and shortness of breath.   Cardiovascular:  Positive for leg swelling. Negative for chest pain.  Gastrointestinal: Negative.  Negative for abdominal pain, constipation, diarrhea and nausea.  Genitourinary: Negative.  Negative for dysuria, flank pain and urgency.  Musculoskeletal: Negative.  Negative for back pain and joint pain.  Skin: Negative.  Negative for rash.  Neurological:  Positive for tingling, sensory change and weakness. Negative for dizziness, focal weakness and headaches.  Psychiatric/Behavioral:  The patient is not nervous/anxious.     As per HPI. Otherwise, a complete review of systems is negative.  PAST MEDICAL HISTORY: Past Medical History:  Diagnosis Date   Anxiety    Breast cancer, left (HCC) 07/2017   Depression    Hypertension    Hypothyroidism    Personal history of chemotherapy 2019   LEFT mastectomy-chemo before   Personal history of radiation therapy 03/2018   LEFT mastectomy   Rapid heart rate    Thyroid disease     PAST SURGICAL HISTORY: Past Surgical History:  Procedure Laterality Date   AXILLARY LYMPH NODE BIOPSY Left 07/16/2017  METASTATIC MAMMARY CARCINOMA   BREAST BIOPSY Left 07/16/2017   [place] bx of left breast mass and left breast LN.   INVASIVE MAMMARY CARCINOMA, NO SPECIAL TYPE.    BREAST EXCISIONAL BIOPSY Right 2001   benign   BREAST LUMPECTOMY WITH SENTINEL LYMPH NODE BIOPSY Left 12/06/2017   Procedure: BREAST LUMPECTOMY WITH SENTINEL LYMPH NODE BX;  Surgeon: Earline Mayotte, MD;  Location: [facility] ORS;  Service: General;  Laterality: Left;   COLONOSCOPY     MASTECTOMY Left 12/23/2017   PORTACATH PLACEMENT Right 08/07/2017   Procedure: INSERTION PORT-A-CATH;  Surgeon: Earline Mayotte, MD;  Location: [facility] ORS;  Service: General;  Laterality: Right;   SIMPLE MASTECTOMY WITH AXILLARY SENTINEL NODE BIOPSY Left 12/23/2017   T2,N2 with 6/7 nodes positive. Whole breast radiation.  Surgeon: Earline Mayotte, MD;  Location: [facility] ORS;  Service: General;  Laterality: Left;   TONSILLECTOMY      FAMILY HISTORY: Family History  Problem Relation Age of Onset   Stroke Mother    Thyroid disease Mother    Renal Disease Mother    Stroke Father    Heart attack Father    Sudden death Father 21       suicide   Anuerysm Brother    Breast cancer Neg Hx     ADVANCED DIRECTIVES (Y/N):  N  HEALTH MAINTENANCE: Social History   Tobacco Use   Smoking status: Former    Current packs/day: 0.00    Average packs/day: 1 pack/day for 30.0 years (30.0 ttl pk-yrs)    Types: Cigarettes    Start date: 89    Quit date: 2019    Years since quitting: 5.6   Smokeless tobacco: Never  Vaping Use   Vaping status: Never Used  Substance Use Topics   Alcohol use: Not Currently   Drug use: Never    Allergies  Allergen Reactions   Sulfa Antibiotics Diarrhea    Current Outpatient Medications  Medication Sig Dispense Refill   acetaminophen (TYLENOL) 500 MG tablet Take 1,000 mg by mouth every 6 (six) hours as needed for moderate pain or headache.      ALPRAZolam (XANAX) 0.5 MG tablet Take 1 tablet (0.5 mg total) by mouth 2 (two) times daily as needed for anxiety. 10 tablet 0   Amino Acids-Protein Hydrolys (PRO-STAT SUGAR FREE PO) Take  30 mLs by mouth 2 (two) times daily.     amLODipine (NORVASC) 5 MG tablet Take 1 tablet (5 mg total) by mouth daily. 90 tablet 3   aspirin EC 81 MG tablet Take 81 mg by mouth at bedtime.      calcium carbonate (OS-CAL - DOSED IN MG OF ELEMENTAL CALCIUM) 1250 (500 Ca) MG tablet Take 1 tablet by mouth daily with breakfast.     Cholecalciferol (VITAMIN D3) 125 MCG (5000 UT) CAPS Take 5,000 Units by mouth 2 (two) times a day.     doxycycline (VIBRA-TABS) 100 MG tablet Take 1 tablet (100 mg total) by mouth 2 (two) times daily for 10 days. 20 tablet 0   levothyroxine (SYNTHROID) 100 MCG tablet Take 1 tablet (100 mcg total) by mouth daily before breakfast.     loratadine (CLARITIN) 10 MG tablet Take 10 mg by mouth daily. Take to reduce risk of rash from everolimus.     metoprolol tartrate (LOPRESSOR) 25 MG tablet Take 0.5 tablets (12.5 mg total) by mouth 2 (two) times daily.     Multiple Vitamin (MULTIVITAMIN WITH MINERALS) TABS tablet Take 1 tablet by  mouth daily.     pantoprazole (PROTONIX) 20 MG tablet TAKE 1 TABLET(20 MG) BY MOUTH DAILY 90 tablet 0   potassium chloride SA (KLOR-CON M) 20 MEQ tablet TAKE 1 TABLET BY MOUTH TWICE DAILY (Patient taking differently: Take 20 mEq by mouth 2 (two) times daily.) 60 tablet 3   Probiotic Product (ALIGN) 4 MG CAPS Take 1 capsule by mouth daily.     rosuvastatin (CRESTOR) 5 MG tablet Take 1 tablet (5 mg total) by mouth daily. 90 tablet 3   Sennosides-Docusate Sodium (SENNA PLUS PO) Take 1 capsule by mouth as needed (constipation).     traZODone (DESYREL) 50 MG tablet Take 1 tablet (50 mg total) by mouth at bedtime.     traMADol (ULTRAM) 50 MG tablet Take 1 tablet (50 mg total) by mouth every 6 (six) hours as needed. (Patient not taking: Reported on 09/05/2022) 12 tablet 0   No current facility-administered medications for this visit.    OBJECTIVE: Vitals:   09/12/22 1453  BP: (!) 96/43  Pulse: 76  Temp: 97.7 F (36.5 C)  SpO2: 96%     Body mass index is  32.43 kg/m.    ECOG FS:2 - Symptomatic, <50% confined to bed  General: Well-developed, well-nourished, no acute distress.   Eyes: Pink conjunctiva, anicteric sclera. HEENT: Normocephalic, moist mucous membranes. Lungs: No audible wheezing or coughing. Heart: Regular rate and rhythm. 2+ peripheral edema of leg extending to thighs Abdomen: Soft, nontender, no obvious distention. Musculoskeletal: 1-2+ peripheral edema.   Neuro: Alert, answering all questions appropriately. Cranial nerves grossly intact. Skin: See below Psych: Normal affect.   LAB RESULTS:  Lab Results  Component Value Date   NA 135 09/05/2022   K 3.9 09/05/2022   CL 103 09/05/2022   CO2 24 09/05/2022   GLUCOSE 111 (H) 09/05/2022   BUN 17 09/05/2022   CREATININE 1.11 (H) 09/05/2022   CALCIUM 9.0 09/05/2022   PROT 6.4 (L) 09/05/2022   ALBUMIN 3.3 (L) 09/05/2022   AST 40 09/05/2022   ALT 29 09/05/2022   ALKPHOS 148 (H) 09/05/2022   BILITOT 0.3 09/05/2022   GFRNONAA 52 (L) 09/05/2022   GFRAA 57 (L) 10/01/2019    Lab Results  Component Value Date   WBC 4.9 09/05/2022   NEUTROABS 3.4 09/05/2022   HGB 8.7 (L) 09/05/2022   HCT 27.9 (L) 09/05/2022   MCV 94.3 09/05/2022   PLT 298 09/05/2022     STUDIES: US Venous Img Upper Uni Left  Result Date: 09/05/2022 CLINICAL DATA:  Left upper extremity pain and edema for the past 2-3 weeks. History of pulmonary embolism. Evaluate for DVT. EXAM: LEFT UPPER EXTREMITY VENOUS DOPPLER ULTRASOUND TECHNIQUE: Gray-scale sonography with graded compression, as well as color Doppler and duplex ultrasound were performed to evaluate the upper extremity deep venous system from the level of the subclavian vein and including the jugular, axillary, basilic, radial, ulnar and upper cephalic vein. Spectral Doppler was utilized to evaluate flow at rest and with distal augmentation maneuvers. COMPARISON:  None Available. FINDINGS: Contralateral Subclavian Vein: Respiratory phasicity is normal  and symmetric with the symptomatic side. No evidence of thrombus. Normal compressibility. Internal Jugular Vein: No evidence of thrombus. Normal compressibility, respiratory phasicity and response to augmentation. Subclavian Vein: No evidence of thrombus. Normal compressibility, respiratory phasicity and response to augmentation. Axillary Vein: No evidence of thrombus. Normal compressibility, respiratory phasicity and response to augmentation. Cephalic Vein: No evidence of thrombus. Normal compressibility, respiratory phasicity and response to augmentation. Basilic Vein: No  evidence of thrombus. Normal compressibility, respiratory phasicity and response to augmentation. Brachial Veins: No evidence of thrombus. Normal compressibility, respiratory phasicity and response to augmentation. Radial Veins: No evidence of thrombus. Normal compressibility, respiratory phasicity and response to augmentation. Ulnar Veins: No evidence of thrombus. Normal compressibility, respiratory phasicity and response to augmentation. Other Findings:  None visualized. IMPRESSION: No evidence of DVT within the left upper extremity. Electronically Signed   By: Simonne Come M.D.   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 Here with concerns regarding LUE edema and redness   PLAN:    LUE swelling: Acute on chronic. A bit worse since last visit. Doppler negative for DVT. Doxycyline seemingly ineffective. She is afebrile. Suspect related to possible use of this arm for blood collection/Bps during her hospitalization/rehabilitation stay vs peripheral edema secondary to third spacing from CHF. Will check a pro-BNP to confirm. Given that her hydrochlorothiazide controlled her peripheral edema much better than the lasix currently is, I would suggest she switch back to her original hydrochlorothiazide rx- GFR/creatinine improved from hospitalization- electrolytes  acceptable today. She will need to use caution to prevent hypotension given current BP. We discussed fluid restriction, weight monitoring, along with red flag signs and symptoms. They have follow up next week with our office but I will also reach out to her cardiologist Dr. Okey Dupre. Patient also referred to lymphedema clinic.   Rash of hands was visible while assessing LUE swelling. She has had multiple events of mental status changes. I do not see any syphilis testing having been performed. Will obtain today.   Disposition Labs today Referral to lymphedema clinic Keep follow up next week with Dr. Colon Branch   Patient expressed understanding and was in agreement with this plan. She also understands that She can call clinic at any time with any questions, concerns, or complaints.    Cancer Staging  Breast cancer, stage 2, left (HCC) Staging form: Breast, AJCC 8th Edition - Clinical stage from 07/30/2017: Stage IIA (cT2, cN1, cM0, G2, ER+, PR+, HER2-) - Signed by Jeralyn Ruths, MD on 07/30/2017 Histologic grading system: 3 grade system Laterality: Left   Rushie Chestnut, PA-C   09/12/2022 3:13 PM

## 2022-09-13 ENCOUNTER — Ambulatory Visit: Payer: No Typology Code available for payment source | Attending: Medical Oncology

## 2022-09-13 ENCOUNTER — Other Ambulatory Visit: Payer: Self-pay

## 2022-09-13 DIAGNOSIS — R600 Localized edema: Secondary | ICD-10-CM | POA: Insufficient documentation

## 2022-09-13 DIAGNOSIS — I890 Lymphedema, not elsewhere classified: Secondary | ICD-10-CM | POA: Insufficient documentation

## 2022-09-13 DIAGNOSIS — C50912 Malignant neoplasm of unspecified site of left female breast: Secondary | ICD-10-CM | POA: Insufficient documentation

## 2022-09-13 DIAGNOSIS — I972 Postmastectomy lymphedema syndrome: Secondary | ICD-10-CM | POA: Diagnosis not present

## 2022-09-13 DIAGNOSIS — C7951 Secondary malignant neoplasm of bone: Secondary | ICD-10-CM | POA: Insufficient documentation

## 2022-09-13 DIAGNOSIS — M6281 Muscle weakness (generalized): Secondary | ICD-10-CM | POA: Insufficient documentation

## 2022-09-13 LAB — MISC LABCORP TEST (SEND OUT)
Labcorp test code: 143000
Labcorp test code: 82345

## 2022-09-13 LAB — CANCER ANTIGEN 27.29: CA 27.29: 105.6 U/mL — ABNORMAL HIGH (ref 0.0–38.6)

## 2022-09-13 NOTE — Therapy (Signed)
OUTPATIENT PT/OT UPPER EXTREMITY LYMPHEDEMA EVALUATION  Patient Name: Alicia [name] MRN: [id number] DOB:Feb 13, 1950, 72 y.o., female Today's Date: 09/14/2022  END OF SESSION:   OT End of Session - 09/14/22 2254     Visit Number 1    Number of Visits 24    Date for OT Re-Evaluation 12/06/22    Progress Note Due on Visit 10    OT Start Time 1150    OT Stop Time 1245    OT Time Calculation (min) 55 min    Equipment Utilized During Treatment transport chair    Activity Tolerance Patient tolerated treatment well    Behavior During Therapy WFL for tasks assessed/performed            Past Medical History:  Diagnosis Date   Anxiety    Breast cancer, left (HCC) 07/2017   Depression    Hypertension    Hypothyroidism    Personal history of chemotherapy 2019   LEFT mastectomy-chemo before   Personal history of radiation therapy 03/2018   LEFT mastectomy   Rapid heart rate    Thyroid disease    Past Surgical History:  Procedure Laterality Date   AXILLARY LYMPH NODE BIOPSY Left 07/16/2017   METASTATIC MAMMARY CARCINOMA   BREAST BIOPSY Left 07/16/2017   [place] bx of left breast mass and left breast LN.  INVASIVE MAMMARY CARCINOMA, NO SPECIAL TYPE.    BREAST EXCISIONAL BIOPSY Right 2001   benign   BREAST LUMPECTOMY WITH SENTINEL LYMPH NODE BIOPSY Left 12/06/2017   Procedure: BREAST LUMPECTOMY WITH SENTINEL LYMPH NODE BX;  Surgeon: Earline Mayotte, MD;  Location: [facility] OR[facility];  Service: General;  Laterality: Left;   COLONOSCOPY     MASTECTOMY Left 12/23/2017   PORTACATH PLACEMENT Right 08/07/2017   Procedure: INSERTION PORT-A-CATH;  Surgeon: Earline Mayotte, MD;  Location: [facility] OR[facility];  Service: General;  Laterality: Right;   SIMPLE MASTECTOMY WITH AXILLARY SENTINEL NODE BIOPSY Left 12/23/2017   T2,N2 with 6/7 nodes positive. Whole breast radiation.  Surgeon: Earline Mayotte, MD;  Location: [facility] OR[facility];  Service: General;  Laterality: Left;   TONSILLECTOMY     Patient Active  Problem List   Diagnosis Date Noted   Pneumonia 07/24/2022   Palliative care encounter 07/24/2022   Malnutrition of moderate degree 07/20/2022   Acute UTI 07/20/2022   Pressure injury of skin 07/20/2022   Hypokalemia 07/19/2022   Hypophosphatemia 07/19/2022   Acute metabolic encephalopathy 07/19/2022   Generalized weakness 07/18/2022   AKI (acute kidney injury) (HCC) 07/18/2022   Electrolyte abnormality 07/18/2022   QT prolongation 07/18/2022   Diarrhea 07/18/2022   Altered mental status 07/18/2022   Contact dermatitis 05/10/2021   Hematuria 03/31/2021   Urinary tract infection without hematuria 03/01/2021   PSVT (paroxysmal supraventricular tachycardia) 02/20/2020   Mobitz type 1 second degree AV block 02/20/2020   Pure hypercholesterolemia 02/20/2020   Daytime somnolence 02/18/2020   Chest pain 11/13/2019   Palpitations 11/13/2019   Obesity (BMI 35.0-39.9 without comorbidity) 11/08/2019   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 Corky Downs  REFERRING PROVIDER: Clent Jacks, PA-C  REFERRING DIAG:  Diagnosis  R60.0 (ICD-10-CM) -  Edema of left upper extremity  C50.912,C79.51 (ICD-10-CM) - Carcinoma of left breast metastatic to bone (HCC)  I89.0 (ICD-10-CM) - Lymphedema   THERAPY DIAG:  Postmastectomy lymphedema syndrome  Muscle weakness (generalized)  ONSET DATE: 07/2022  Rationale for Evaluation and Treatment: Rehabilitation  SUBJECTIVE                                                                                                                                                                                           SUBJECTIVE STATEMENT: Pt reports that she's here because of the swelling in her arm, but she's also concerned about the new swelling in her BLEs.  Pt accompanied today by daughter, Alicia [name].    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  LUE lymphedema, BLE edema.  Per chart from [name] on 08/15/22: ONCOLOGY HISTORY: Patient initially received neoadjuvant chemotherapy with Adriamycin and Cytoxan followed by weekly Taxol. She only received 4 cycles of weekly Taxol prior to discontinuation of treatment on October 31, 2017 secondary to persistent peripheral neuropathy. She ultimately required mastectomy and final pathology noted 5 of 6 lymph nodes positive for disease. Patient completed adjuvant XRT in mid April 2020. Pain initiated letrozole in May 2020, this was discontinued secondary to side effects and patient was started on anastrozole. Nuclear medicine bone scan on June 19, 2019 revealed metastatic disease and anastrozole was subsequently discontinued. PET scan results from October 02, 2021 revealed no clear evidence of metastatic progression. PET scan results from June 30, 2019 reviewed independently with metastatic bony disease, but no obvious evidence of visceral disease. MRI of the brain on August 24, 2019 reviewed independently with no obvious evidence of metastatic disease.   Pt reports that she was hospitalized in July for a UTI which resulted in sepsis.  Pt was discharged from the hospital and sent to rehab at [facility] where she states her lymphedema worsened there at the end of July.  Pt reports she has been given 2 different antibiotics, and she has 2 days left of Doxycycline, though she reports she has a refill.  OT encouraged pt/family follow up with MD to see if they would like her to refill the medication, as pt does still have mild heat radiating from the forearm and pinkish tint to this area.  Pt  has also had recent BP med changes, stating that she was on hydrochlorothiazide, got switched to lasix, and now is back on hydrochlorothiazide.  Pt returns for cardiology follow up Sept 13th and she reports the swelling in her BLEs is new.   Per chart from visit with Clent Jacks, PA  on 09/12/22:   Patient is here to follow up on left arm swelling and pain. She has had lymphedema for quite some time secondary to her breast surgeries/lymph node removal. After being in the hospital for altered mental status/dehydration/AKI and rehabilitation, the arm swelling and pain worsened. At her last visit on 09/05/2022 a doppler was performed which showed no evidence of DVT of arm. She was started on a 10 day course of doxycyline.    Today she reports that the swelling is about the same, if not worse. She has also developed gradually worsening peripheral edema since her rehabilitation facility switched her off of hydrochlorothiazide 12.5 mg onto lasix (unknown dose). She denies SOB but is having trouble ambulating due to the swelling and discomfort. She is unsure about weight changes. She has been fluid restricting.   PAIN:  Are you having pain? Yes NPRS scale: 3-5/10 Pain location: LUE Pain orientation: Left  PAIN TYPE: aching, tight, and tingling Pain description: constant  Aggravating factors: reaching up Relieving factors: rest, elevating the arm  PRECAUTIONS: None  WEIGHT BEARING RESTRICTIONS: No  FALLS:  Has patient fallen in last 6 months? No  LIVING ENVIRONMENT: Lives with: lives with their family, lives with Lowella Bandy, daughter (who works from home), and son, [name] Lives in: 2 level home Stairs: Yes: Internal: 1 flight steps; on right going up and bilateral but cannot reach both and External: 1 steps; none Has following equipment at home: Dan Humphreys - 2 wheeled and [employer] - 4 wheeled (uses 2 wheeled downstairs and 4 wheeled upstairs on carpet)  OCCUPATION: retired  LEISURE: watch tv,  read  PRIOR LEVEL OF FUNCTION: Independent and living alone until her d/c from [facility] rehab end of July 2024, at which point pt moved in with daughter and son d/t requiring increased level of care.  PATIENT GOALS: "Reduce the swelling in my L arm and my legs and build up my strength."  OBJECTIVE  COGNITION:  Overall cognitive status: Within functional limits for tasks assessed ; 0x4, but does appear slightly forgetful  PALPATION: LUE skin is soft in the upper arm, more taut in the forearm and hand, noting at least 2+ pitting edema in the L forearm, hand, and BLEs.  L forearm is slightly warm to the touch and has a pinkish tint as compared to R unaffected side.  OBSERVATIONS: Pt and daughter pleasant, cooperative, and receptive to initial discussion of care plan.   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  LUE does appear quite heavy, with pt using R hand to cradle the arm at times.   HAND DOMINANCE: Right  UPPER EXTREMITY AROM:  AROM RIGHT   eval   Shoulder extension   Shoulder flexion WFL  Shoulder abduction [facility]  Shoulder internal rotation WNL  Shoulder external rotation WFL    (Blank rows = not tested)  AROM LEFT   eval  Shoulder extension   Shoulder flexion 111*  Shoulder abduction 100*  Shoulder internal rotation WNL  Shoulder external rotation 85* with arm abd at 90*    (Blank rows = not tested)   CERVICAL AROM: NT at eval     Flexion   Extension   Right lateral flexion   Left lateral flexion  Right rotation   Left rotation     UPPER EXTREMITY STRENGTH:  R shoulder flex/abd: 4-; L shoulder flex 3+ (within available range), L shoulder abd 4- (within available  range) R/L elbow flex/ext: 4+;  R/L wrist flex/ext 4+  LOWER EXTREMITY AROM/PROM: NT  LYMPHEDEMA ASSESSMENTS: see circumferential measurements noted below; additional testing to be completed in upcoming sessions as needed  SURGERY TYPE/DATE: L mastectomy 12/23/2017  NUMBER OF LYMPH NODES REMOVED: 6-7 L axillary lymph nodes removed  CHEMOTHERAPY: normally takes a chemo pill daily but currently paused until pt can get her strength back.    RADIATION: 3 rounds of radiation, with last tx being May 31st 2024; radiation targeting L hip and pelvis.  Pt reports 16 total treatments in the pelvic area.  HORMONE TREATMENT: had been taking a hormone replacement pill but recently stopped in the hospital; may restart it, per pt/daughter  INFECTIONS: question for LUE cellulitis and subsequently given ABX x2  -Pt reports she did have a sacral pu from recent hospitalization but cleared up at [facility], and denied infection.  LYMPHEDEMA ASSESSMENTS: circumferential measurements in cm   LANDMARK RIGHT eval  10 cm proximal to olecranon process 31.3 cm  Olecranon process 27.0 cm  10 cm proximal to ulnar styloid process 20.2 cm  Just proximal to ulnar styloid process 15.9 cm  Across hand at thumb web space 20.6 cm  At base of 3rd digit 6.5 cm    LANDMARK LEFT  eval  10 cm proximal to olecranon process 38.0 cm  Olecranon process 31.8 cm  10 cm proximal to ulnar styloid process 26.5 cm  Just proximal to ulnar styloid process 19.8 cm  Across hand at thumb web space 21.5 cm  At base of 3rd digit 6.6 cm    FUNCTIONAL TESTS: none  GAIT:  Distance walked: NT Assistive device utilized:  transport chair, RW Level of assistance: Modified independence short distances inside the home on level ground with RW  PATIENT SURVEYS:  FOTO To complete next session  TODAY'S TREATMENT:                                                                                                                                          DATE: 09/13/22 Evaluation completed.  Provided introductory education on lymphatic system, lymphedema, CTD tx plan, importance of good compliance and caregiver support to maximize results of fluid loss and long term maintenance of fluid loss.    PATIENT EDUCATION:  Education details: OT role, goals, poc, lymphedema and subsequent tx program/CTD Person educated: Patient and Child(ren) (daughter, Alicia [name]) Education method: Explanation Education comprehension: verbalized understanding and needs further education  HOME EXERCISE PROGRAM: To be initiated in follow up sessions  ASSESSMENT:  CLINICAL IMPRESSION: Patient is a 72 y.o. female who was seen today for occupational therapy evaluation and treatment for LUE lymphedema.  Pt presents with secondary, moderate, stage 2 lymphedema  in the LUE, s/p L mastectomy and axillary lymph node removal.  Pt reports lymphedema is relatively new, though she noted worsening swelling of the L arm while in rehab at [facility] over the Summer, when she was [place] following a hospitalization for UTI and sepsis.  Pt has stage 4 breast ca with bone mets, present in the L hip and pelvic regions.  Recently moved in with daughter and son d/t requiring increased level of care for ADLs and mobility.  Daughter works from home and she reports a family member will plan to accompany pt to each tx session.  Pt/family verbalize understanding of importance of caregiver participation in lymphedema tx plan to optimize successful outcomes.  Pt will benefit from skilled OT to provide manual lymphatic drainage, therapeutic exercises/HEP, education on skin care and disease management, educ on possible use of vasopneumatic pump, layered compression bandaging to the LUE, and fitting for maintenance compression garment in order to reduce and later maintain LUE limb volume, promote healthy skin integrity, maximize ROM/strength, and increase functional use of the LUE for daily tasks.  Pt/daughter in  agreement with poc.   OBJECTIVE IMPAIRMENTS: decreased activity tolerance, decreased endurance, decreased knowledge of condition, decreased knowledge of use of DME, decreased mobility, decreased ROM, decreased strength, hypomobility, increased edema, increased fascial restrictions, impaired flexibility, impaired sensation, impaired UE functional use, improper body mechanics, postural dysfunction, obesity, and pain.   ACTIVITY LIMITATIONS: carrying, lifting, bending, standing, squatting, sleeping, stairs, transfers, bed mobility, bathing, toileting, dressing, reach over head, hygiene/grooming, and locomotion level  PARTICIPATION LIMITATIONS: meal prep, cleaning, laundry, medication management, driving, shopping, and community activity  PERSONAL FACTORS: Age, Education, Fitness, Past/current experiences, Time since onset of injury/illness/exacerbation, and 3+ comorbidities: stage 4 ca, L mastectomy, lymph node removal/radiation, anxiety, depression, cardiac hx (see chart), obesity  are also affecting patient's functional outcome.   REHAB POTENTIAL: Fair for goals  CLINICAL DECISION MAKING: Unstable/unpredictable  EVALUATION COMPLEXITY: High  GOALS: Goals reviewed with patient? Yes  SHORT TERM GOALS: Target date: 10/25/22  (6 weeks)  Pt will perform HEP with set up/min vc for promoting lymph flow and LUE flexibility for better engagement of the LUE with daily tasks. Baseline: Eval: HEP not yet initiated Goal status: INITIAL  2.  Pt will demo accurate technique to apply and wear layered compression bandages as instructed on the LUE 23 hours/day with 75% or better consistency in order to reduce LUE limb volume.   Baseline: Eval: Will plan to issue and begin bandaging/educ in the next 1-2 sessions.  Goal status: Initial  3. Pt/caregiver will be indep to verbalize 3-4 lymphedema precautions to prevent exacerbations and/or infections.  Baseline: Eval: educ not yet initiated.  Goal status:  Initial   LONG TERM GOALS: Target date: 12/06/22 (12 weeks)  Pt will increase FOTO score to (TBD) or better to indicate improvement in self perceived functional use of the L arm with daily tasks. Baseline: Eval: FOTO to be completed next session (time constraints at eval) Goal status: INITIAL  2. Pt/caregiver will demo LUE MLD techniques with min vc or less and use of visual handout to promote fluid mobilization in the LUE.     Baseline: Eval: Educ not yet initiated  Goal status: Initial  3. Pt to measure 8-10% or more limb volume reduction in the LUE in order to promote healthy skin integrity, reduce infection risk, improve fit of clothing, and ease ability to engage the LUE into daily tasks.  Baseline: Eval: initial circumferential measures indicate LUE to  be ~15% larger than RUE, but will plan to measure actual volumetrics in upcoming tx session. Goal status: Initial  4. Pt to be fitted with LUE compression garment for long-term maintenance of fluid loss in the LUE.  Baseline: Eval: to be fit during last phase of CDT program  Goal status: Initial  PLAN:  PT FREQUENCY: 2x/week  PT DURATION: 12 weeks  PLANNED INTERVENTIONS: Therapeutic exercises, Therapeutic activity, Patient/Family education, Self Care, DME instructions, Manual lymph drainage, Compression bandaging, scar mobilization, Taping, Vasopneumatic device, and Manual therapy  PLAN FOR NEXT SESSION: see above  Danelle Earthly, MS, OTR/L, CLT  Otis Dials, OT 09/14/2022, 10:57 PM

## 2022-09-14 ENCOUNTER — Telehealth: Payer: Self-pay | Admitting: Medical Oncology

## 2022-09-14 ENCOUNTER — Other Ambulatory Visit: Payer: Self-pay | Admitting: *Deleted

## 2022-09-14 ENCOUNTER — Encounter: Payer: Self-pay | Admitting: Oncology

## 2022-09-14 DIAGNOSIS — C50912 Malignant neoplasm of unspecified site of left female breast: Secondary | ICD-10-CM

## 2022-09-14 NOTE — Telephone Encounter (Signed)
Spoke with patient and daughter in law. Discussed that syphilis test was negative and pro-bnp was elevated. Heart is a bit overworked. Edema in legs is from third spaced fluid discussed in common language.   She has switched from lasix back to her hydrochlorothiazide 12.5 which she tolerates better and she thinks the swelling has improved. She is not having any SOB.   Our plan at this time is to have her fluid restrict to two standard size bottles of water daily, track daily weights, take daily blood pressures, and continue on current dose of hydrochlorothiazide as it is working to reduce her swelling. If BP is above 120/80 she can consider taking two 12.5 mg hydrochlorothiazide tablets.   She has follow up next week where we can adjust plan further. Reviewed ER precautions. She will keep her follow up with Dr. Serita Kyle team as scheduled.

## 2022-09-15 NOTE — Addendum Note (Signed)
Addended by: Otis Dials on: 09/15/2022 04:28 PM   Modules accepted: Orders

## 2022-09-18 ENCOUNTER — Inpatient Hospital Stay (HOSPITAL_BASED_OUTPATIENT_CLINIC_OR_DEPARTMENT_OTHER): Payer: Medicare Other | Admitting: Oncology

## 2022-09-18 ENCOUNTER — Inpatient Hospital Stay: Payer: Medicare Other | Attending: Oncology

## 2022-09-18 ENCOUNTER — Inpatient Hospital Stay: Payer: Medicare Other | Admitting: Pharmacist

## 2022-09-18 ENCOUNTER — Inpatient Hospital Stay: Payer: Medicare Other

## 2022-09-18 ENCOUNTER — Ambulatory Visit: Attending: Medical Oncology

## 2022-09-18 ENCOUNTER — Other Ambulatory Visit: Payer: Self-pay

## 2022-09-18 ENCOUNTER — Other Ambulatory Visit (HOSPITAL_COMMUNITY): Payer: Self-pay

## 2022-09-18 ENCOUNTER — Other Ambulatory Visit: Payer: Self-pay | Admitting: *Deleted

## 2022-09-18 VITALS — BP 93/51 | HR 64 | Temp 97.6°F | Wt 196.0 lb

## 2022-09-18 DIAGNOSIS — C7951 Secondary malignant neoplasm of bone: Secondary | ICD-10-CM | POA: Insufficient documentation

## 2022-09-18 DIAGNOSIS — M6281 Muscle weakness (generalized): Secondary | ICD-10-CM | POA: Diagnosis not present

## 2022-09-18 DIAGNOSIS — D649 Anemia, unspecified: Secondary | ICD-10-CM | POA: Diagnosis not present

## 2022-09-18 DIAGNOSIS — Z87891 Personal history of nicotine dependence: Secondary | ICD-10-CM | POA: Diagnosis not present

## 2022-09-18 DIAGNOSIS — R278 Other lack of coordination: Secondary | ICD-10-CM | POA: Diagnosis not present

## 2022-09-18 DIAGNOSIS — C50912 Malignant neoplasm of unspecified site of left female breast: Secondary | ICD-10-CM

## 2022-09-18 DIAGNOSIS — Z170 Estrogen receptor positive status [ER+]: Secondary | ICD-10-CM | POA: Insufficient documentation

## 2022-09-18 DIAGNOSIS — Z86718 Personal history of other venous thrombosis and embolism: Secondary | ICD-10-CM | POA: Diagnosis not present

## 2022-09-18 DIAGNOSIS — M8580 Other specified disorders of bone density and structure, unspecified site: Secondary | ICD-10-CM | POA: Insufficient documentation

## 2022-09-18 DIAGNOSIS — I890 Lymphedema, not elsewhere classified: Secondary | ICD-10-CM | POA: Diagnosis not present

## 2022-09-18 DIAGNOSIS — G629 Polyneuropathy, unspecified: Secondary | ICD-10-CM | POA: Insufficient documentation

## 2022-09-18 DIAGNOSIS — I972 Postmastectomy lymphedema syndrome: Secondary | ICD-10-CM | POA: Insufficient documentation

## 2022-09-18 DIAGNOSIS — F419 Anxiety disorder, unspecified: Secondary | ICD-10-CM | POA: Insufficient documentation

## 2022-09-18 LAB — CBC WITH DIFFERENTIAL/PLATELET
Abs Immature Granulocytes: 0.04 10*3/uL (ref 0.00–0.07)
Basophils Absolute: 0 10*3/uL (ref 0.0–0.1)
Basophils Relative: 1 %
Eosinophils Absolute: 0.1 10*3/uL (ref 0.0–0.5)
Eosinophils Relative: 3 %
HCT: 30.3 % — ABNORMAL LOW (ref 36.0–46.0)
Hemoglobin: 9.3 g/dL — ABNORMAL LOW (ref 12.0–15.0)
Immature Granulocytes: 1 %
Lymphocytes Relative: 8 %
Lymphs Abs: 0.3 10*3/uL — ABNORMAL LOW (ref 0.7–4.0)
MCH: 29.2 pg (ref 26.0–34.0)
MCHC: 30.7 g/dL (ref 30.0–36.0)
MCV: 95.3 fL (ref 80.0–100.0)
Monocytes Absolute: 0.7 10*3/uL (ref 0.1–1.0)
Monocytes Relative: 16 %
Neutro Abs: 3 10*3/uL (ref 1.7–7.7)
Neutrophils Relative %: 71 %
Platelets: 267 10*3/uL (ref 150–400)
RBC: 3.18 MIL/uL — ABNORMAL LOW (ref 3.87–5.11)
RDW: 20.7 % — ABNORMAL HIGH (ref 11.5–15.5)
WBC: 4.2 10*3/uL (ref 4.0–10.5)
nRBC: 0 % (ref 0.0–0.2)

## 2022-09-18 LAB — CMP (CANCER CENTER ONLY)
ALT: 13 U/L (ref 0–44)
AST: 23 U/L (ref 15–41)
Albumin: 3.5 g/dL (ref 3.5–5.0)
Alkaline Phosphatase: 122 U/L (ref 38–126)
Anion gap: 10 (ref 5–15)
BUN: 25 mg/dL — ABNORMAL HIGH (ref 8–23)
CO2: 23 mmol/L (ref 22–32)
Calcium: 9.5 mg/dL (ref 8.9–10.3)
Chloride: 101 mmol/L (ref 98–111)
Creatinine: 1.1 mg/dL — ABNORMAL HIGH (ref 0.44–1.00)
GFR, Estimated: 53 mL/min — ABNORMAL LOW (ref >60)
Glucose, Bld: 118 mg/dL — ABNORMAL HIGH (ref 70–99)
Potassium: 4 mmol/L (ref 3.5–5.1)
Sodium: 134 mmol/L — ABNORMAL LOW (ref 135–145)
Total Bilirubin: 0.5 mg/dL (ref 0.3–1.2)
Total Protein: 6.2 g/dL — ABNORMAL LOW (ref 6.5–8.1)

## 2022-09-18 MED ORDER — TRAMADOL HCL 50 MG PO TABS: 50.0000 mg | ORAL_TABLET | Freq: Four times a day (QID) | ORAL | 0 refills | Status: DC | PRN

## 2022-09-18 MED ORDER — EXEMESTANE 25 MG PO TABS
25.0000 mg | ORAL_TABLET | Freq: Every day | ORAL | 2 refills | Status: DC
Start: 2022-09-18 — End: 2022-12-03
  Filled 2022-09-18 (×2): qty 30, 30d supply, fill #0
  Filled 2022-10-04 – 2022-10-12 (×3): qty 30, 30d supply, fill #1

## 2022-09-18 MED ORDER — EVEROLIMUS 5 MG PO TABS
5.0000 mg | ORAL_TABLET | Freq: Every day | ORAL | 0 refills | Status: DC
Start: 2022-09-18 — End: 2022-10-04
  Filled 2022-09-18 (×2): qty 30, 30d supply, fill #0

## 2022-09-18 MED ORDER — TRAZODONE HCL 50 MG PO TABS: 50.0000 mg | ORAL_TABLET | Freq: Every day | ORAL | 2 refills | Status: DC

## 2022-09-18 NOTE — Progress Notes (Signed)
Oral Chemotherapy Clinic [facility]  Telephone:([phone] [phone]  [name], [name], [name], [name] Jim Like, RN as Registered Nurse Orlie Dakin Tollie Pizza, MD as Consulting Physician (Oncology) Carmina Miller, MD as Referring Physician (Radiation Oncology) Chriss Driver, RN as Registered Nurse   Name of the patient: Alicia [name]  [id number]  [birth year]   Date of visit: 09/18/22  HPI: Patient is a 72 y.o. female with metastatic ER+ HER2- breast cancer. Previously treated with palbociclib, but treatment was changed to Afinitor (everolimus) and exemestane due to disease progression 03/2022. Everolimus 10 mg held on 05/22/22 due to potential drug rash, patient started on steroid taper. Rash cleared and patient resumed everolimus 10 mg on 05/31/22. Patient called to report rash return on 06/04/22. Treatment was again held and prescription for hydrocortisone 2.5% cream sent in. Patient resumed everolimus 7.5mg  on 06/18/22 along with loratidine. Patient was admitted to [facility] hospital for altered mental status, dehydration, and weakness on 07/18/22, everolimus and exemestane were held with admission and at discharge. Patient to resume everolimus (at 5mg )  and exemestane on 09/20/22.   Reason for Consult: Oral chemotherapy follow-up for everolimus and exemestane therapy.   PAST MEDICAL HISTORY: Past Medical History:  Diagnosis Date   Anxiety    Breast cancer, left (HCC) 07/2017   Depression    Hypertension    Hypothyroidism    Personal history of chemotherapy 2019   LEFT mastectomy-chemo before   Personal history of radiation therapy 03/2018   LEFT mastectomy   Rapid heart rate    Thyroid disease     HEMATOLOGY/ONCOLOGY HISTORY:  Oncology History Overview Note  Patient is a 72 year old female who recently self palpated a mass on her left breast.  Subsequent  imaging and biopsy revealed the above-stated breast cancer.  Case was also discussed extensively at case conference.  Given the size and the stage of patient's malignancy, she will benefit from neoadjuvant chemotherapy using Adriamycin, Cytoxan, and Taxol.  Patient will also require Neulasta support.  Will get CT scan of the chest, abdomen, and pelvis to assess for any metastatic disease.  Patient will also require port placement and MUGA prior to initiating treatment  CT abdomen/pelvis/chest did not reveal any suspicious lesions concerning for metastatic disease. (08/01/17)  Port-A-Cath placed on 08/07/2017.  Cycle 1 day 1 of AC was given on 08/08/17.     Breast cancer, stage 2, left (HCC)  07/24/2017 Initial Diagnosis   Breast cancer, stage 2, left (HCC)   07/30/2017 Cancer Staging   Staging form: Breast, AJCC 8th Edition - Clinical stage from 07/30/2017: Stage IIA (cT2, cN1, cM0, G2, ER+, PR+, HER2-) - Signed by Jeralyn Ruths, MD on 07/30/2017   08/08/2017 - 10/31/2017 Chemotherapy   The patient had DOXOrubicin (ADRIAMYCIN) chemo injection 130 mg, 60 mg/m2 = 130 mg, Intravenous,  Once, 4 of 4 cycles Administration: 130 mg (08/08/2017), 130 mg (08/22/2017), 130 mg (09/05/2017), 130 mg (09/19/2017) palonosetron (ALOXI) injection 0.25 mg, 0.25 mg, Intravenous,  Once, 4 of 4 cycles Administration: 0.25 mg (08/08/2017), 0.25 mg (08/22/2017), 0.25 mg (09/05/2017), 0.25 mg (09/19/2017) pegfilgrastim-cbqv (UDENYCA) injection 6 mg, 6 mg, Subcutaneous, Once, 4 of 4 cycles Administration: 6 mg (08/09/2017), 6 mg (08/23/2017), 6 mg (09/06/2017), 6 mg (09/20/2017) cyclophosphamide (CYTOXAN) 1,300 mg in sodium chloride 0.9 % 250 mL chemo infusion, 600 mg/m2 = 1,300 mg, Intravenous,  Once, 4 of 4 cycles Administration: 1,300 mg (  08/08/2017), 1,300 mg (08/22/2017), 1,300 mg (09/05/2017), 1,300 mg (09/19/2017) PACLitaxel (TAXOL) 174 mg in sodium chloride 0.9 % 250 mL chemo infusion (</= 80mg /m2), 80 mg/m2 = 174 mg, Intravenous,   Once, 4 of 12 cycles Dose modification: 72 mg/m2 (original dose 80 mg/m2, Cycle 6, Reason: Dose not tolerated) Administration: 174 mg (10/03/2017), 156 mg (10/17/2017), 156 mg (10/24/2017), 156 mg (10/31/2017) fosaprepitant (EMEND) 150 mg, dexamethasone (DECADRON) 12 mg in sodium chloride 0.9 % 145 mL IVPB, , Intravenous,  Once, 4 of 4 cycles Administration:  (08/08/2017),  (08/22/2017),  (09/05/2017),  (09/19/2017)  for chemotherapy treatment.      ALLERGIES:  is allergic to sulfa antibiotics.  MEDICATIONS:  Current Outpatient Medications  Medication Sig Dispense Refill   acetaminophen (TYLENOL) 500 MG tablet Take 1,000 mg by mouth every 6 (six) hours as needed for moderate pain or headache.      ALPRAZolam (XANAX) 0.5 MG tablet Take 1 tablet (0.5 mg total) by mouth 2 (two) times daily as needed for anxiety. 10 tablet 0   Amino Acids-Protein Hydrolys (PRO-STAT SUGAR FREE PO) Take 30 mLs by mouth 2 (two) times daily.     amLODipine (NORVASC) 5 MG tablet Take 1 tablet (5 mg total) by mouth daily. 90 tablet 3   aspirin EC 81 MG tablet Take 81 mg by mouth at bedtime.      calcium carbonate (OS-CAL - DOSED IN MG OF ELEMENTAL CALCIUM) 1250 (500 Ca) MG tablet Take 1 tablet by mouth daily with breakfast.     Cholecalciferol (VITAMIN D3) 125 MCG (5000 UT) CAPS Take 5,000 Units by mouth 2 (two) times a day.     levothyroxine (SYNTHROID) 100 MCG tablet Take 1 tablet (100 mcg total) by mouth daily before breakfast.     loratadine (CLARITIN) 10 MG tablet Take 10 mg by mouth daily. Take to reduce risk of rash from everolimus.     metoprolol tartrate (LOPRESSOR) 25 MG tablet Take 0.5 tablets (12.5 mg total) by mouth 2 (two) times daily.     Multiple Vitamin (MULTIVITAMIN WITH MINERALS) TABS tablet Take 1 tablet by mouth daily.     pantoprazole (PROTONIX) 20 MG tablet TAKE 1 TABLET(20 MG) BY MOUTH DAILY 90 tablet 0   potassium chloride SA (KLOR-CON M) 20 MEQ tablet TAKE 1 TABLET BY MOUTH TWICE DAILY (Patient  taking differently: Take 20 mEq by mouth 2 (two) times daily.) 60 tablet 3   Probiotic Product (ALIGN) 4 MG CAPS Take 1 capsule by mouth daily.     rosuvastatin (CRESTOR) 5 MG tablet Take 1 tablet (5 mg total) by mouth daily. 90 tablet 3   Sennosides-Docusate Sodium (SENNA PLUS PO) Take 1 capsule by mouth as needed (constipation).     traMADol (ULTRAM) 50 MG tablet Take 1 tablet (50 mg total) by mouth every 6 (six) hours as needed. 60 tablet 0   traZODone (DESYREL) 50 MG tablet Take 1 tablet (50 mg total) by mouth at bedtime.     traZODone (DESYREL) 50 MG tablet Take 1 tablet (50 mg total) by mouth at bedtime. 30 tablet 2   No current facility-administered medications for this visit.    VITAL SIGNS: There were no vitals taken for this visit. There were no vitals filed for this visit.  Estimated body mass index is 31.64 kg/m as calculated from the following:   Height as of 09/05/22: 5\' 6"  (1.676 m).   Weight as of an earlier encounter on 09/18/22: 88.9 kg (196 lb).  LABS: CBC:  Component Value Date/Time   WBC 4.2 09/18/2022 0931   HGB 9.3 (L) 09/18/2022 0931   HGB 8.7 (L) 09/12/2022 1612   HGB 14.2 02/11/2011 1349   HCT 30.3 (L) 09/18/2022 0931   HCT 40.9 02/11/2011 1349   PLT 267 09/18/2022 0931   PLT 281 09/12/2022 1612   PLT 151 02/11/2011 1349   MCV 95.3 09/18/2022 0931   MCV 89 02/11/2011 1349   NEUTROABS 3.0 09/18/2022 0931   LYMPHSABS 0.3 (L) 09/18/2022 0931   MONOABS 0.7 09/18/2022 0931   EOSABS 0.1 09/18/2022 0931   BASOSABS 0.0 09/18/2022 0931   Comprehensive Metabolic Panel:    Component Value Date/Time   NA 134 (L) 09/18/2022 0931   NA 142 02/11/2011 1349   K 4.0 09/18/2022 0931   K 3.8 02/11/2011 1349   CL 101 09/18/2022 0931   CL 107 02/11/2011 1349   CO2 23 09/18/2022 0931   CO2 24 02/11/2011 1349   BUN 25 (H) 09/18/2022 0931   BUN 14 02/11/2011 1349   CREATININE 1.10 (H) 09/18/2022 0931   CREATININE 0.65 02/11/2011 1349   GLUCOSE 118 (H) 09/18/2022  0931   GLUCOSE 98 02/11/2011 1349   CALCIUM 9.5 09/18/2022 0931   CALCIUM 9.0 02/11/2011 1349   AST 23 09/18/2022 0931   ALT 13 09/18/2022 0931   ALT 36 02/11/2011 1349   ALKPHOS 122 09/18/2022 0931   ALKPHOS 122 02/11/2011 1349   BILITOT 0.5 09/18/2022 0931   PROT 6.2 (L) 09/18/2022 0931   PROT 7.0 02/11/2011 1349   ALBUMIN 3.5 09/18/2022 0931   ALBUMIN 4.1 02/11/2011 1349     Present during today's visit: Patient, son, and daughter-in-law  Assessment and Plan: Patient was admitted to [facility] hospital for altered mental status, dehydration, and weakness on 07/18/22, everolimus and exemestane were held with admission and at discharge.  CBC/CMP reviewed and patient is feeling improved, resume everolimus (at 5mg )  and exemestane Patient reports using now living with her son. She uses a walker to move around the house and will begin weekly therapy soon.    Oral Chemotherapy Side Effect/Intolerance:  N/A treatment was on hold prior to today's visit  Oral Chemotherapy Adherence: N/A No patient barriers to medication adherence identified.   New medications: none reported  Medication Access Issues: no issues, restart rxs will be sent in today, pt fills at [facility])  Patient expressed understanding and was in agreement with this plan. She also understands that She can call clinic at any time with any questions, concerns, or complaints.   Follow-up plan: RTC in 4 weeks  Thank you for allowing me to participate in the care of this very pleasant patient.   Time Total: 15 mins  Visit consisted of counseling and education on dealing with issues of symptom management in the setting of serious and potentially life-threatening illness.Greater than 50%  of this time was spent counseling and coordinating care related to the above assessment and plan.  Signed by: Remi Haggard, PharmD, BCPS, BCOP, CPP Hematology/Oncology Clinical Pharmacist  Practitioner [place]/DB/AP Oral Chemotherapy Navigation Clinic [phone]

## 2022-09-18 NOTE — Progress Notes (Signed)
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  INTERVAL HISTORY: Patient returns to clinic today for further evaluation and consideration of reinitiation of Afinitor and Aromasin.  She is now at home living with her son and daughter-in-law.  She continues to have weakness and fatigue, but reports this is improving.  She has left arm lymphedema.  She otherwise feels well.  Her jaw pain has resolved.  She continues to have a mild peripheral neuropathy, but no other neurologic complaints.  She denies any chest pain, shortness of breath, cough, or hemoptysis.  She denies any nausea, vomiting, or constipation.  She has no urinary complaints.  Patient offers no further specific complaints today.  REVIEW OF SYSTEMS:   Review of Systems  Constitutional:  Positive for malaise/fatigue. Negative for fever and weight loss.  HENT:  Negative for congestion.   Respiratory: Negative.  Negative for cough and shortness of breath.   Cardiovascular: Negative.  Negative for chest pain and leg swelling.  Gastrointestinal: Negative.  Negative for abdominal pain, constipation, diarrhea and nausea.  Genitourinary: Negative.  Negative for dysuria, flank pain and urgency.  Musculoskeletal: Negative.  Negative for back pain and joint pain.  Skin: Negative.  Negative for rash.  Neurological:  Positive for tingling, sensory change and weakness. Negative for dizziness, focal  weakness and headaches.  Psychiatric/Behavioral:  The patient is not nervous/anxious.     As per HPI. Otherwise, a complete review of systems is negative.  PAST MEDICAL HISTORY: Past Medical History:  Diagnosis Date   Anxiety    Breast cancer, left (HCC) 07/2017   Depression    Hypertension    Hypothyroidism    Personal history of chemotherapy 2019   LEFT mastectomy-chemo before   Personal history of radiation therapy 03/2018   LEFT mastectomy   Rapid heart rate    Thyroid disease     PAST SURGICAL HISTORY: Past Surgical History:  Procedure Laterality Date   AXILLARY LYMPH NODE BIOPSY Left 07/16/2017   METASTATIC MAMMARY CARCINOMA   BREAST BIOPSY Left 07/16/2017   [place] bx of left breast mass and left breast LN.  INVASIVE MAMMARY CARCINOMA, NO SPECIAL TYPE.    BREAST EXCISIONAL BIOPSY Right 2001   benign   BREAST LUMPECTOMY WITH SENTINEL LYMPH NODE BIOPSY Left 12/06/2017   Procedure: BREAST LUMPECTOMY WITH SENTINEL LYMPH NODE BX;  Surgeon: Earline Mayotte, MD;  Location: [facility] ORS;  Service: General;  Laterality: Left;   COLONOSCOPY     MASTECTOMY Left 12/23/2017   PORTACATH PLACEMENT Right 08/07/2017   Procedure: INSERTION PORT-A-CATH;  Surgeon: Earline Mayotte, MD;  Location: [facility] ORS;  Service: General;  Laterality: Right;   SIMPLE MASTECTOMY WITH AXILLARY SENTINEL NODE BIOPSY Left 12/23/2017   T2,N2 with 6/7 nodes positive. Whole breast radiation.  Surgeon: Earline Mayotte, MD;  Location: [facility] ORS;  Service: General;  Laterality: Left;   TONSILLECTOMY      FAMILY HISTORY: Family History  Problem Relation Age of Onset   Stroke Mother    Thyroid disease Mother  Renal Disease Mother    Stroke Father    Heart attack Father    Sudden death Father 30       suicide   Anuerysm Brother    Breast cancer Neg Hx     ADVANCED DIRECTIVES (Y/N):  N  HEALTH MAINTENANCE: Social History   Tobacco Use   Smoking status: Former    Current packs/day: 0.00    Average  packs/day: 1 pack/day for 30.0 years (30.0 ttl pk-yrs)    Types: Cigarettes    Start date: 74    Quit date: 2019    Years since quitting: 5.6   Smokeless tobacco: Never  Vaping Use   Vaping status: Never Used  Substance Use Topics   Alcohol use: Not Currently   Drug use: Never     Colonoscopy:  PAP:  Bone density:  Lipid panel:  Allergies  Allergen Reactions   Sulfa Antibiotics Diarrhea    Current Outpatient Medications  Medication Sig Dispense Refill   acetaminophen (TYLENOL) 500 MG tablet Take 1,000 mg by mouth every 6 (six) hours as needed for moderate pain or headache.      ALPRAZolam (XANAX) 0.5 MG tablet Take 1 tablet (0.5 mg total) by mouth 2 (two) times daily as needed for anxiety. 10 tablet 0   Amino Acids-Protein Hydrolys (PRO-STAT SUGAR FREE PO) Take 30 mLs by mouth 2 (two) times daily.     amLODipine (NORVASC) 5 MG tablet Take 1 tablet (5 mg total) by mouth daily. 90 tablet 3   aspirin EC 81 MG tablet Take 81 mg by mouth at bedtime.      calcium carbonate (OS-CAL - DOSED IN MG OF ELEMENTAL CALCIUM) 1250 (500 Ca) MG tablet Take 1 tablet by mouth daily with breakfast.     Cholecalciferol (VITAMIN D3) 125 MCG (5000 UT) CAPS Take 5,000 Units by mouth 2 (two) times a day.     levothyroxine (SYNTHROID) 100 MCG tablet Take 1 tablet (100 mcg total) by mouth daily before breakfast.     loratadine (CLARITIN) 10 MG tablet Take 10 mg by mouth daily. Take to reduce risk of rash from everolimus.     metoprolol tartrate (LOPRESSOR) 25 MG tablet Take 0.5 tablets (12.5 mg total) by mouth 2 (two) times daily.     Multiple Vitamin (MULTIVITAMIN WITH MINERALS) TABS tablet Take 1 tablet by mouth daily.     pantoprazole (PROTONIX) 20 MG tablet TAKE 1 TABLET(20 MG) BY MOUTH DAILY 90 tablet 0   potassium chloride SA (KLOR-CON M) 20 MEQ tablet TAKE 1 TABLET BY MOUTH TWICE DAILY (Patient taking differently: Take 20 mEq by mouth 2 (two) times daily.) 60 tablet 3   Probiotic Product (ALIGN) 4  MG CAPS Take 1 capsule by mouth daily.     rosuvastatin (CRESTOR) 5 MG tablet Take 1 tablet (5 mg total) by mouth daily. 90 tablet 3   Sennosides-Docusate Sodium (SENNA PLUS PO) Take 1 capsule by mouth as needed (constipation).     traZODone (DESYREL) 50 MG tablet Take 1 tablet (50 mg total) by mouth at bedtime.     everolimus (AFINITOR) 5 MG tablet Take 1 tablet (5 mg total) by mouth daily. 30 tablet 0   exemestane (AROMASIN) 25 MG tablet Take 1 tablet (25 mg total) by mouth daily after breakfast. 30 tablet 2   traMADol (ULTRAM) 50 MG tablet Take 1 tablet (50 mg total) by mouth every 6 (six) hours as needed. 60 tablet 0   traZODone (DESYREL) 50 MG tablet  Take 1 tablet (50 mg total) by mouth at bedtime. 30 tablet 2   No current facility-administered medications for this visit.    OBJECTIVE: Vitals:   09/18/22 1002  BP: (!) 93/51  Pulse: 64  Temp: 97.6 F (36.4 C)  SpO2: 100%     Body mass index is 31.64 kg/m.    ECOG FS:1 - Symptomatic but completely ambulatory  General: Well-developed, well-nourished, no acute distress.  Sitting in a wheelchair. Eyes: Pink conjunctiva, anicteric sclera. HEENT: Normocephalic, moist mucous membranes. Lungs: No audible wheezing or coughing. Heart: Regular rate and rhythm. Abdomen: Soft, nontender, no obvious distention. Musculoskeletal:  Left arm lymphedema noted.  Bilateral lower extremity edema as well. Neuro: Alert, answering all questions appropriately. Cranial nerves grossly intact. Skin: No rashes or petechiae noted. Psych: Normal affect.    LAB RESULTS:  Lab Results  Component Value Date   NA 134 (L) 09/18/2022   K 4.0 09/18/2022   CL 101 09/18/2022   CO2 23 09/18/2022   GLUCOSE 118 (H) 09/18/2022   BUN 25 (H) 09/18/2022   CREATININE 1.10 (H) 09/18/2022   CALCIUM 9.5 09/18/2022   PROT 6.2 (L) 09/18/2022   ALBUMIN 3.5 09/18/2022   AST 23 09/18/2022   ALT 13 09/18/2022   ALKPHOS 122 09/18/2022   BILITOT 0.5 09/18/2022    GFRNONAA 53 (L) 09/18/2022   GFRAA 57 (L) 10/01/2019    Lab Results  Component Value Date   WBC 4.2 09/18/2022   NEUTROABS 3.0 09/18/2022   HGB 9.3 (L) 09/18/2022   HCT 30.3 (L) 09/18/2022   MCV 95.3 09/18/2022   PLT 267 09/18/2022     STUDIES: US Venous Img Upper Uni Left  Result Date: 09/05/2022 CLINICAL DATA:  Left upper extremity pain and edema for the past 2-3 weeks. History of pulmonary embolism. Evaluate for DVT. EXAM: LEFT UPPER EXTREMITY VENOUS DOPPLER ULTRASOUND TECHNIQUE: Gray-scale sonography with graded compression, as well as color Doppler and duplex ultrasound were performed to evaluate the upper extremity deep venous system from the level of the subclavian vein and including the jugular, axillary, basilic, radial, ulnar and upper cephalic vein. Spectral Doppler was utilized to evaluate flow at rest and with distal augmentation maneuvers. COMPARISON:  None Available. FINDINGS: Contralateral Subclavian Vein: Respiratory phasicity is normal and symmetric with the symptomatic side. No evidence of thrombus. Normal compressibility. Internal Jugular Vein: No evidence of thrombus. Normal compressibility, respiratory phasicity and response to augmentation. Subclavian Vein: No evidence of thrombus. Normal compressibility, respiratory phasicity and response to augmentation. Axillary Vein: No evidence of thrombus. Normal compressibility, respiratory phasicity and response to augmentation. Cephalic Vein: No evidence of thrombus. Normal compressibility, respiratory phasicity and response to augmentation. Basilic Vein: No evidence of thrombus. Normal compressibility, respiratory phasicity and response to augmentation. Brachial Veins: No evidence of thrombus. Normal compressibility, respiratory phasicity and response to augmentation. Radial Veins: No evidence of thrombus. Normal compressibility, respiratory phasicity and response to augmentation. Ulnar Veins: No evidence of thrombus. Normal  compressibility, respiratory phasicity and response to augmentation. Other Findings:  None visualized. IMPRESSION: No evidence of DVT within the left upper extremity. Electronically Signed   By: Simonne Come M.D.   On: 09/05/2022 16:03     ONCOLOGY HISTORY: Patient initially received neoadjuvant chemotherapy with Adriamycin and Cytoxan followed by weekly Taxol. She only received 4 cycles of weekly Taxol prior to discontinuation of treatment on October 31, 2017 secondary to persistent peripheral neuropathy.  She ultimately required mastectomy and final pathology noted 5 of 6 lymph  nodes positive for disease.  Patient completed adjuvant XRT in mid April 2020.  Pain initiated letrozole in May 2020, this was discontinued secondary to side effects and patient was started on anastrozole.  Nuclear medicine bone scan on June 19, 2019 revealed metastatic disease and anastrozole was subsequently discontinued.  PET scan results from October 02, 2021 revealed no clear evidence of metastatic progression.  PET scan results from June 30, 2019 reviewed independently with metastatic bony disease, but no obvious evidence of visceral disease.  MRI of the brain on August 24, 2019 reviewed independently with no obvious evidence of metastatic disease.   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  Patient CA 27-29 has trended down and is now 105.6. PET scan results from March 29, 2022 reviewed independently with what appears to be progression of disease in her bones.  She continues to have no visceral disease.  Patient's Afinitor and Aromasin have been on hold since her hospital admission on July 18, 2022.  Her performance status is improving, therefore will reinitiate treatment with dose reduced Afinitor 5 mg daily along with Aromasin 25 mg  daily.  [name] has been permanently discontinued given her issues with her mandible.  Return to clinic in 4 weeks for repeat laboratory work, further evaluation, and continuation of treatment.  Appreciate clinical pharmacy input.   Diarrhea: Patient does not complain of this today.  Continue Imodium as needed. Peripheral neuropathy: Chronic and unchanged. History of pulmonary embolus: Patient was diagnosed with a small pulmonary embolus on January 10, 2018.  She is no longer on anticoagulation.  There was no evidence of recurrent PE on CT scan from October 27, 2019. Osteopenia: Patient's most recent bone mineral density on March 03, 2019 reported a T score of -1.8 which is only mildly decreased from 1 year prior when the reported T score was -1.6.  Continue calcium and vitamin D supplementation.  Patient no longer will receive bisphosphonates.   Left hip/flank pain: Patient does not complain of pain today.  Continue tramadol as prescribed.  She has now completed XRT.  Patient was given a refill of tramadol today. Leukopenia: Resolved.  Anemia: Chronic and unchanged.  Patient's hemoglobin is 9.3 today. Renal insufficiency: Mild creatinine 1.1 today. Hypokalemia: Resolved.  Continue oral potassium supplementation. Dental pathology: Significantly improved.  Continue follow up with Dr. Willaim Bane.  Patient has discontinued hyperbaric treatments at [facility]. Transaminitis: Resolved. Lymphedema: Follow-up with occupational therapy as scheduled. Coping/anxiety: Continue trazodone at night.  Patient was also given a referral to [facility].   Patient expressed understanding and was in agreement with this plan. She also understands that She can call clinic at any time with any questions, concerns, or complaints.    Cancer Staging  Breast cancer, stage 2, left (HCC) Staging form: Breast, AJCC 8th Edition - Clinical stage from 07/30/2017: Stage IIA (cT2, cN1, cM0, G2, ER+, PR+, HER2-) - Signed by  Jeralyn Ruths, MD on 07/30/2017 Histologic grading system: 3 grade system Laterality: Left   Jeralyn Ruths, MD   09/18/2022 1:14 PM

## 2022-09-18 NOTE — Progress Notes (Signed)
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  Noted hospital admission with UTI, sepsis, AMS.  Past medical history of left mastectomy.  Patient to resume everolimus and exemstane on 9/5.  Met with patient in clinic.  Reports that her appetite has improved was decreased in June and July.  She is now living with son and daughter in law.  She normally has cornflakes with banana for breakfast.  Lunch is sandwich or salad and dinner is meat and couple of vegetables.  Reports some constipation.  Reports that pressure ulcer has healed that was on coccyx.  Currently not drinking oral nutrition supplements   Medications: MVI, Magnesium, calcium and vit D, KCL   Labs: K 4.0, glucose 118, BUN 25, creatinine 1.10   Anthropometrics:   Height: 66 inches Weight: 196 lb today UBW: 200 lb.  Prefers to be under 200lb BMI: 32  2% weight loss in the last 3 months  NUTRITION DIAGNOSIS: Inadequate oral intake related to chronic illness as evidenced by 2% weight loss in the last 3 months and decreased intake but intake now improved    INTERVENTION:  Encouraged protein food at every meal.  Discussed foods high in protein.   Consider oral nutrition supplements (ensure/boost) for days when appetite is not that good.   Continue MVI Can discontinue Vit C as wound has healed and appetite improved Continue meeting with OT to improve lymphedema.   Contact information provied   MONITORING, EVALUATION, GOAL: weight trends, intake   Next Visit: Thursday, Oct 3rd in clinic  Jaret Coppedge B. Freida Busman, RD, LDN Registered Dietitian [phone]

## 2022-09-18 NOTE — Progress Notes (Unsigned)
[facility]  [phone] [phone]  ID: Alicia [name] OB: 1950-02-17  MR#: [id number]  QIO#:[id number]  [name], [name], [name], [name] Jim Like, RN as Registered Nurse Jeralyn Ruths, MD as Consulting Physician (Oncology) Carmina Miller, MD as Referring Physician (Radiation Oncology) Chriss Driver, RN as Registered Nurse   CHIEF COMPLAINT: Recurrent stage IV ER/PR positive, 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  INTERVAL HISTORY: Patient returns for to clinic for further evaluation and hospital follow-up.  She was recently in the hospital with UTI, altered mental status, and sepsis.  She is now at an assisted living for rehab.  She continues to have significant weakness and fatigue as well as edema, but states these are improving.  She otherwise feels well.  Her jaw pain has resolved.  She continues to have a mild peripheral neuropathy, but no other neurologic complaints.  She denies any chest pain, shortness of breath, cough, or hemoptysis.  She denies any nausea, vomiting, or constipation.  She has no urinary complaints.  Patient offers no other specific complaints today.  REVIEW OF SYSTEMS:   Review of Systems  Constitutional:  Positive for malaise/fatigue. Negative for fever and weight loss.  HENT:  Negative for congestion.   Respiratory: Negative.  Negative for cough and shortness of breath.   Cardiovascular:  Positive for leg swelling. Negative for chest pain.  Gastrointestinal: Negative.  Negative for abdominal pain, constipation, diarrhea and nausea.  Genitourinary: Negative.  Negative for dysuria, flank pain and urgency.  Musculoskeletal: Negative.  Negative for back pain and joint pain.  Skin: Negative.  Negative for rash.  Neurological:  Positive for tingling, sensory change and weakness. Negative for  dizziness, focal weakness and headaches.  Psychiatric/Behavioral:  The patient is not nervous/anxious.     As per HPI. Otherwise, a complete review of systems is negative.  PAST MEDICAL HISTORY: Past Medical History:  Diagnosis Date   Anxiety    Breast cancer, left (HCC) 07/2017   Depression    Hypertension    Hypothyroidism    Personal history of chemotherapy 2019   LEFT mastectomy-chemo before   Personal history of radiation therapy 03/2018   LEFT mastectomy   Rapid heart rate    Thyroid disease     PAST SURGICAL HISTORY: Past Surgical History:  Procedure Laterality Date   AXILLARY LYMPH NODE BIOPSY Left 07/16/2017   METASTATIC MAMMARY CARCINOMA   BREAST BIOPSY Left 07/16/2017   [place] bx of left breast mass and left breast LN.  INVASIVE MAMMARY CARCINOMA, NO SPECIAL TYPE.    BREAST EXCISIONAL BIOPSY Right 2001   benign   BREAST LUMPECTOMY WITH SENTINEL LYMPH NODE BIOPSY Left 12/06/2017   Procedure: BREAST LUMPECTOMY WITH SENTINEL LYMPH NODE BX;  Surgeon: Earline Mayotte, MD;  Location: [facility] ORS;  Service: General;  Laterality: Left;   COLONOSCOPY     MASTECTOMY Left 12/23/2017   PORTACATH PLACEMENT Right 08/07/2017   Procedure: INSERTION PORT-A-CATH;  Surgeon: Earline Mayotte, MD;  Location: [facility] ORS;  Service: General;  Laterality: Right;   SIMPLE MASTECTOMY WITH AXILLARY SENTINEL NODE BIOPSY Left 12/23/2017   T2,N2 with 6/7 nodes positive. Whole breast radiation.  Surgeon: Earline Mayotte, MD;  Location: [facility] ORS;  Service: General;  Laterality: Left;   TONSILLECTOMY      FAMILY HISTORY: Family History  Problem Relation Age of Onset   Stroke Mother  Thyroid disease Mother    Renal Disease Mother    Stroke Father    Heart attack Father    Sudden death Father 26       suicide   Anuerysm Brother    Breast cancer Neg Hx     ADVANCED DIRECTIVES (Y/N):  N  HEALTH MAINTENANCE: Social History   Tobacco Use   Smoking status: Former    Current packs/day:  0.00    Average packs/day: 1 pack/day for 30.0 years (30.0 ttl pk-yrs)    Types: Cigarettes    Start date: 40    Quit date: 2019    Years since quitting: 5.6   Smokeless tobacco: Never  Vaping Use   Vaping status: Never Used  Substance Use Topics   Alcohol use: Not Currently   Drug use: Never     Colonoscopy:  PAP:  Bone density:  Lipid panel:  Allergies  Allergen Reactions   Sulfa Antibiotics Diarrhea    Current Outpatient Medications  Medication Sig Dispense Refill   acetaminophen (TYLENOL) 500 MG tablet Take 1,000 mg by mouth every 6 (six) hours as needed for moderate pain or headache.      ALPRAZolam (XANAX) 0.5 MG tablet Take 1 tablet (0.5 mg total) by mouth 2 (two) times daily as needed for anxiety. 10 tablet 0   Amino Acids-Protein Hydrolys (PRO-STAT SUGAR FREE PO) Take 30 mLs by mouth 2 (two) times daily.     amLODipine (NORVASC) 5 MG tablet Take 1 tablet (5 mg total) by mouth daily. 90 tablet 3   aspirin EC 81 MG tablet Take 81 mg by mouth at bedtime.      calcium carbonate (OS-CAL - DOSED IN MG OF ELEMENTAL CALCIUM) 1250 (500 Ca) MG tablet Take 1 tablet by mouth daily with breakfast.     Cholecalciferol (VITAMIN D3) 125 MCG (5000 UT) CAPS Take 5,000 Units by mouth 2 (two) times a day.     levothyroxine (SYNTHROID) 100 MCG tablet Take 1 tablet (100 mcg total) by mouth daily before breakfast.     loratadine (CLARITIN) 10 MG tablet Take 10 mg by mouth daily. Take to reduce risk of rash from everolimus.     metoprolol tartrate (LOPRESSOR) 25 MG tablet Take 0.5 tablets (12.5 mg total) by mouth 2 (two) times daily.     Multiple Vitamin (MULTIVITAMIN WITH MINERALS) TABS tablet Take 1 tablet by mouth daily.     pantoprazole (PROTONIX) 20 MG tablet TAKE 1 TABLET(20 MG) BY MOUTH DAILY 90 tablet 0   potassium chloride SA (KLOR-CON M) 20 MEQ tablet TAKE 1 TABLET BY MOUTH TWICE DAILY (Patient taking differently: Take 20 mEq by mouth 2 (two) times daily.) 60 tablet 3   Probiotic  Product (ALIGN) 4 MG CAPS Take 1 capsule by mouth daily.     rosuvastatin (CRESTOR) 5 MG tablet Take 1 tablet (5 mg total) by mouth daily. 90 tablet 3   Sennosides-Docusate Sodium (SENNA PLUS PO) Take 1 capsule by mouth as needed (constipation).     traMADol (ULTRAM) 50 MG tablet Take 1 tablet (50 mg total) by mouth every 6 (six) hours as needed. 60 tablet 0   traZODone (DESYREL) 50 MG tablet Take 1 tablet (50 mg total) by mouth at bedtime.     No current facility-administered medications for this visit.    OBJECTIVE: There were no vitals filed for this visit.    There is no height or weight on file to calculate BMI.    ECOG FS:2 -  Symptomatic, <50% confined to bed  General: Well-developed, well-nourished, no acute distress.  Sitting in wheelchair. Eyes: Pink conjunctiva, anicteric sclera. HEENT: Normocephalic, moist mucous membranes. Lungs: No audible wheezing or coughing. Heart: Regular rate and rhythm. Abdomen: Soft, nontender, no obvious distention. Musculoskeletal: 1-2+ peripheral edema.   Neuro: Alert, answering all questions appropriately. Cranial nerves grossly intact. Skin: No rashes or petechiae noted. Psych: Normal affect.   LAB RESULTS:  Lab Results  Component Value Date   NA 134 (L) 09/18/2022   K 4.0 09/18/2022   CL 101 09/18/2022   CO2 23 09/18/2022   GLUCOSE 118 (H) 09/18/2022   BUN 25 (H) 09/18/2022   CREATININE 1.10 (H) 09/18/2022   CALCIUM 9.5 09/18/2022   PROT 6.2 (L) 09/18/2022   ALBUMIN 3.5 09/18/2022   AST 23 09/18/2022   ALT 13 09/18/2022   ALKPHOS 122 09/18/2022   BILITOT 0.5 09/18/2022   GFRNONAA 53 (L) 09/18/2022   GFRAA 57 (L) 10/01/2019    Lab Results  Component Value Date   WBC 4.2 09/18/2022   NEUTROABS 3.0 09/18/2022   HGB 9.3 (L) 09/18/2022   HCT 30.3 (L) 09/18/2022   MCV 95.3 09/18/2022   PLT 267 09/18/2022     STUDIES: US Venous Img Upper Uni Left  Result Date: 09/05/2022 CLINICAL DATA:  Left upper extremity pain and  edema for the past 2-3 weeks. History of pulmonary embolism. Evaluate for DVT. EXAM: LEFT UPPER EXTREMITY VENOUS DOPPLER ULTRASOUND TECHNIQUE: Gray-scale sonography with graded compression, as well as color Doppler and duplex ultrasound were performed to evaluate the upper extremity deep venous system from the level of the subclavian vein and including the jugular, axillary, basilic, radial, ulnar and upper cephalic vein. Spectral Doppler was utilized to evaluate flow at rest and with distal augmentation maneuvers. COMPARISON:  None Available. FINDINGS: Contralateral Subclavian Vein: Respiratory phasicity is normal and symmetric with the symptomatic side. No evidence of thrombus. Normal compressibility. Internal Jugular Vein: No evidence of thrombus. Normal compressibility, respiratory phasicity and response to augmentation. Subclavian Vein: No evidence of thrombus. Normal compressibility, respiratory phasicity and response to augmentation. Axillary Vein: No evidence of thrombus. Normal compressibility, respiratory phasicity and response to augmentation. Cephalic Vein: No evidence of thrombus. Normal compressibility, respiratory phasicity and response to augmentation. Basilic Vein: No evidence of thrombus. Normal compressibility, respiratory phasicity and response to augmentation. Brachial Veins: No evidence of thrombus. Normal compressibility, respiratory phasicity and response to augmentation. Radial Veins: No evidence of thrombus. Normal compressibility, respiratory phasicity and response to augmentation. Ulnar Veins: No evidence of thrombus. Normal compressibility, respiratory phasicity and response to augmentation. Other Findings:  None visualized. IMPRESSION: No evidence of DVT within the left upper extremity. Electronically Signed   By: Simonne Come M.D.   On: 09/05/2022 16:03     ONCOLOGY HISTORY: Patient initially received neoadjuvant chemotherapy with Adriamycin and Cytoxan followed by weekly Taxol. She  only received 4 cycles of weekly Taxol prior to discontinuation of treatment on October 31, 2017 secondary to persistent peripheral neuropathy.  She ultimately required mastectomy and final pathology noted 5 of 6 lymph nodes positive for disease.  Patient completed adjuvant XRT in mid April 2020.  Pain initiated letrozole in May 2020, this was discontinued secondary to side effects and patient was started on anastrozole.  Nuclear medicine bone scan on June 19, 2019 revealed metastatic disease and anastrozole was subsequently discontinued.  PET scan results from October 02, 2021 revealed no clear evidence of metastatic progression.  PET scan results from June 30, 2019 reviewed independently with metastatic bony disease, but no obvious evidence of visceral disease.  MRI of the brain on August 24, 2019 reviewed independently with no obvious evidence of metastatic disease.   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  Patient CA 27-29 has been slowly increasing and her most recent result is 155.9.  PET scan results from March 29, 2022 reviewed independently with what appears to be progression of disease in her bones.  She continues to have no visceral disease.  After lengthy discussion, it was agreed upon that patient will switch treatments to Afinitor 10 mg daily along with Aromasin 25 mg daily.  Ibrance and Faslodex have been discontinued.  [name] has been permanently discontinued given her issues with her mandible.  Patient has been instructed to continue to hold her treatment until her performance status has improved.  Return to clinic in 4 weeks for further evaluation and consideration of reinitiating treatment.   Diarrhea: Patient does not complain of this today.  Continue Imodium as needed. Peripheral neuropathy: Chronic  and unchanged. History of pulmonary embolus: Patient was diagnosed with a small pulmonary embolus on January 10, 2018.  She is no longer on anticoagulation.  There was no evidence of recurrent PE on CT scan from October 27, 2019. Osteopenia: Patient's most recent bone mineral density on March 03, 2019 reported a T score of -1.8 which is only mildly decreased from 1 year prior when the reported T score was -1.6.  Continue calcium and vitamin D supplementation.  Patient no longer will receive bisphosphonates.   Left hip/flank pain: Patient does not complain of pain today.  Continue tramadol as prescribed.  She has now completed XRT.   Leukopenia: Resolved.  Anemia: He globin decreased, but stable at 9.0. Renal insufficiency: Improved.  Monitor.   Hypokalemia: Resolved.  Continue oral potassium supplementation. Dental pathology: Significantly improved.  Continue follow up with Dr. Willaim Bane.  Patient has not doing hyperbaric treatments at [facility] at this point.   Transaminitis: Resolved.   Patient expressed understanding and was in agreement with this plan. She also understands that She can call clinic at any time with any questions, concerns, or complaints.    Cancer Staging  Breast cancer, stage 2, left (HCC) Staging form: Breast, AJCC 8th Edition - Clinical stage from 07/30/2017: Stage IIA (cT2, cN1, cM0, G2, ER+, PR+, HER2-) - Signed by Jeralyn Ruths, MD on 07/30/2017 Histologic grading system: 3 grade system Laterality: Left   Jeralyn Ruths, MD   09/18/2022 10:30 AM

## 2022-09-18 NOTE — Progress Notes (Signed)
Patient has had swelling in her left arm for about 3 weeks now and it is painful she rates her pain at about a 3. She needs a refill on her tramadol.

## 2022-09-19 ENCOUNTER — Other Ambulatory Visit (HOSPITAL_COMMUNITY): Payer: Self-pay

## 2022-09-19 ENCOUNTER — Other Ambulatory Visit: Payer: Self-pay

## 2022-09-19 LAB — CANCER ANTIGEN 27.29: CA 27.29: 109.4 U/mL — ABNORMAL HIGH (ref 0.0–38.6)

## 2022-09-19 NOTE — Therapy (Addendum)
OUTPATIENT OT UPPER EXTREMITY LYMPHEDEMA TREATMENT NOTE  Patient Name: Alicia [name] MRN: [id number] DOB:[birth year], 72 y.o., female Today's Date: 09/19/2022  END OF SESSION:   OT End of Session - 09/19/22 1709     Visit Number 2    Number of Visits 24    Date for OT Re-Evaluation 12/06/22    Progress Note Due on Visit 10    OT Start Time 1445    OT Stop Time 1530    OT Time Calculation (min) 45 min    Equipment Utilized During Treatment transport chair    Activity Tolerance Patient tolerated treatment well    Behavior During Therapy WFL for tasks assessed/performed            Past Medical History:  Diagnosis Date   Anxiety    Breast cancer, left (HCC) 07/2017   Depression    Hypertension    Hypothyroidism    Personal history of chemotherapy 2019   LEFT mastectomy-chemo before   Personal history of radiation therapy 03/2018   LEFT mastectomy   Rapid heart rate    Thyroid disease    Past Surgical History:  Procedure Laterality Date   AXILLARY LYMPH NODE BIOPSY Left 07/16/2017   METASTATIC MAMMARY CARCINOMA   BREAST BIOPSY Left 07/16/2017   [place] bx of left breast mass and left breast LN.  INVASIVE MAMMARY CARCINOMA, NO SPECIAL TYPE.    BREAST EXCISIONAL BIOPSY Right 2001   benign   BREAST LUMPECTOMY WITH SENTINEL LYMPH NODE BIOPSY Left 12/06/2017   Procedure: BREAST LUMPECTOMY WITH SENTINEL LYMPH NODE BX;  Surgeon: Earline Mayotte, MD;  Location: [facility];  Service: General;  Laterality: Left;   COLONOSCOPY     MASTECTOMY Left 12/23/2017   PORTACATH PLACEMENT Right 08/07/2017   Procedure: INSERTION PORT-A-CATH;  Surgeon: Earline Mayotte, MD;  Location: [facility];  Service: General;  Laterality: Right;   SIMPLE MASTECTOMY WITH AXILLARY SENTINEL NODE BIOPSY Left 12/23/2017   T2,N2 with 6/7 nodes positive. Whole breast radiation.  Surgeon: Earline Mayotte, MD;  Location: [facility];  Service: General;  Laterality: Left;   TONSILLECTOMY     Patient Active  Problem List   Diagnosis Date Noted   Pneumonia 07/24/2022   Palliative care encounter 07/24/2022   Malnutrition of moderate degree 07/20/2022   Acute UTI 07/20/2022   Pressure injury of skin 07/20/2022   Hypokalemia 07/19/2022   Hypophosphatemia 07/19/2022   Acute metabolic encephalopathy 07/19/2022   Generalized weakness 07/18/2022   AKI (acute kidney injury) (HCC) 07/18/2022   Electrolyte abnormality 07/18/2022   QT prolongation 07/18/2022   Diarrhea 07/18/2022   Altered mental status 07/18/2022   Contact dermatitis 05/10/2021   Hematuria 03/31/2021   Urinary tract infection without hematuria 03/01/2021   PSVT (paroxysmal supraventricular tachycardia) 02/20/2020   Mobitz type 1 second degree AV block 02/20/2020   Pure hypercholesterolemia 02/20/2020   Daytime somnolence 02/18/2020   Chest pain 11/13/2019   Palpitations 11/13/2019   Obesity (BMI 35.0-39.9 without comorbidity) 11/08/2019   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 Corky Downs  REFERRING PROVIDER: Clent Jacks, PA-C  REFERRING DIAG:  Diagnosis  R60.0 (ICD-10-CM) -  Edema of left upper extremity  C50.912,C79.51 (ICD-10-CM) - Carcinoma of left breast metastatic to bone (HCC)  I89.0 (ICD-10-CM) - Lymphedema   THERAPY DIAG:  Muscle weakness (generalized)  Postmastectomy lymphedema syndrome  Other lack of coordination  ONSET DATE: 07/2022  Rationale for Evaluation and Treatment: Rehabilitation  SUBJECTIVE                                                                                                                                                                                           SUBJECTIVE STATEMENT: Pt reports that she saw [name] earlier today and he plans to start her back on her daily chemo pill starting Thurs of this week. Pt accompanied today by daughter, Lowella Bandy.    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  LUE lymphedema, BLE edema.  Per chart from [name] on 08/15/22: ONCOLOGY HISTORY: Patient initially received neoadjuvant chemotherapy with Adriamycin and Cytoxan followed by weekly Taxol. She only received 4 cycles of weekly Taxol prior to discontinuation of treatment on October 31, 2017 secondary to persistent peripheral neuropathy. She ultimately required mastectomy and final pathology noted 5 of 6 lymph nodes positive for disease. Patient completed adjuvant XRT in mid April 2020. Pain initiated letrozole in May 2020, this was discontinued secondary to side effects and patient was started on anastrozole. Nuclear medicine bone scan on June 19, 2019 revealed metastatic disease and anastrozole was subsequently discontinued. PET scan results from October 02, 2021 revealed no clear evidence of metastatic progression. PET scan results from June 30, 2019 reviewed independently with metastatic bony disease, but no obvious evidence of visceral disease. MRI of the brain on August 24, 2019 reviewed independently with no obvious evidence of metastatic disease.   Pt reports that she was hospitalized in July for a UTI which resulted in sepsis.  Pt was discharged from the hospital and sent to rehab at [facility] where she states her lymphedema worsened there at the end of July.  Pt reports she has been given 2 different antibiotics, and she has 2 days left of Doxycycline, though she reports she has a refill.  OT encouraged pt/family follow up with MD to see if they would like her to refill the medication, as pt does still have mild heat radiating from the  forearm and pinkish tint to this area.  Pt has also had recent BP med changes, stating that she was on hydrochlorothiazide, got switched to lasix, and now is back on hydrochlorothiazide.  Pt returns for cardiology follow up Sept 13th and she reports the swelling in her BLEs is new.   Per  chart from visit with Clent Jacks, PA on 09/12/22:   Patient is here to follow up on left arm swelling and pain. She has had lymphedema for quite some time secondary to her breast surgeries/lymph node removal. After being in the hospital for altered mental status/dehydration/AKI and rehabilitation, the arm swelling and pain worsened. At her last visit on 09/05/2022 a doppler was performed which showed no evidence of DVT of arm. She was started on a 10 day course of doxycyline.    Today she reports that the swelling is about the same, if not worse. She has also developed gradually worsening peripheral edema since her rehabilitation facility switched her off of hydrochlorothiazide 12.5 mg onto lasix (unknown dose). She denies SOB but is having trouble ambulating due to the swelling and discomfort. She is unsure about weight changes. She has been fluid restricting.   PAIN:  Are you having pain? Yes NPRS scale: 3-5/10 Pain location: LUE Pain orientation: Left  PAIN TYPE: aching, tight, and tingling, heavy Pain description: constant  Aggravating factors: reaching up Relieving factors: rest, elevating the arm  PRECAUTIONS: None  WEIGHT BEARING RESTRICTIONS: No  FALLS:  Has patient fallen in last 6 months? No  LIVING ENVIRONMENT: Lives with: lives with their family, lives with Lowella Bandy, daughter (who works from home), and son, [name] Lives in: 2 level home Stairs: Yes: Internal: 1 flight steps; on right going up and bilateral but cannot reach both and External: 1 steps; none Has following equipment at home: Dan Humphreys - 2 wheeled and [employer] - 4 wheeled (uses 2 wheeled downstairs and 4 wheeled upstairs on  carpet)  OCCUPATION: retired  LEISURE: watch tv, read  PRIOR LEVEL OF FUNCTION: Independent and living alone until her d/c from [facility] rehab end of July 2024, at which point pt moved in with daughter and son d/t requiring increased level of care.  PATIENT GOALS: "Reduce the swelling in my L arm and my legs and build up my strength."  OBJECTIVE  COGNITION:  Overall cognitive status: Within functional limits for tasks assessed ; 0x4, but does appear slightly forgetful  PALPATION: LUE skin is soft in the upper arm, more taut in the forearm and hand, noting at least 2+ pitting edema in the L forearm, hand, and BLEs.  L forearm is slightly warm to the touch and has a pinkish tint as compared to R unaffected side.  OBSERVATIONS: Pt and daughter pleasant, cooperative, and receptive to initial discussion of care plan.   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  LUE does appear quite heavy, with pt using R hand to cradle the arm at times.   HAND DOMINANCE: Right  UPPER EXTREMITY AROM:  AROM RIGHT   eval   Shoulder extension   Shoulder flexion WFL  Shoulder abduction [facility]  Shoulder internal rotation WNL  Shoulder external rotation WFL    (Blank rows = not tested)  AROM LEFT   eval  Shoulder extension   Shoulder flexion 111*  Shoulder abduction 100*  Shoulder internal rotation WNL  Shoulder external rotation 85* with arm abd at 90*    (Blank rows = not tested)   CERVICAL AROM: NT at eval     Flexion   Extension   Right lateral  flexion   Left lateral flexion   Right rotation   Left rotation     UPPER EXTREMITY STRENGTH:  R shoulder flex/abd: 4-; L shoulder flex 3+ (within  available range), L shoulder abd 4- (within available range) R/L elbow flex/ext: 4+;  R/L wrist flex/ext 4+  LOWER EXTREMITY AROM/PROM: NT  LYMPHEDEMA ASSESSMENTS: see circumferential measurements noted below; additional testing to be completed in upcoming sessions as needed  SURGERY TYPE/DATE: L mastectomy 12/23/2017  NUMBER OF LYMPH NODES REMOVED: 6-7 L axillary lymph nodes removed  CHEMOTHERAPY: normally takes a chemo pill daily but currently paused until pt can get her strength back.    RADIATION: 3 rounds of radiation, with last tx being May 31st 2024; radiation targeting L hip and pelvis.  Pt reports 16 total treatments in the pelvic area.  HORMONE TREATMENT: had been taking a hormone replacement pill but recently stopped in the hospital; may restart it, per pt/daughter  INFECTIONS: question for LUE cellulitis and subsequently given ABX x2  -Pt reports she did have a sacral pu from recent hospitalization but cleared up at [facility], and denied infection.  LYMPHEDEMA ASSESSMENTS: circumferential measurements in cm   LANDMARK RIGHT eval  10 cm proximal to olecranon process 31.3 cm  Olecranon process 27.0 cm  10 cm proximal to ulnar styloid process 20.2 cm  Just proximal to ulnar styloid process 15.9 cm  Across hand at thumb web space 20.6 cm  At base of 3rd digit 6.5 cm    LANDMARK LEFT  eval  10 cm proximal to olecranon process 38.0 cm  Olecranon process 31.8 cm  10 cm proximal to ulnar styloid process 26.5 cm  Just proximal to ulnar styloid process 19.8 cm  Across hand at thumb web space 21.5 cm  At base of 3rd digit 6.6 cm    FUNCTIONAL TESTS: none  GAIT:  Distance walked: NT Assistive device utilized:  transport chair, RW Level of assistance: Modified independence short distances inside the home on level ground with RW  PATIENT SURVEYS:  FOTO To complete next session 09/18/22: 56; predicted 67  TODAY'S TREATMENT:                                                                                                                                          DATE: 09/18/22 Therapeutic Activity: Volumetrics measured for LUE.      Landmark Right 09/18/22  Left 09/18/22  1  MP (distal base of SF) 20.3 (N/A) 20.6 (N/A)  2  Wrist crease 16.6 18.5  3 Forearm (*) +4 cm 16.8 20.5  4 * +4 cm 19.5 23.1  5 * +4 cm 23.5 27.3  6 * +4 cm 26.0 29.6  7 * +4 cm 27.9 31.9  8  Elbow crease 27.5 31.3  9 Upper arm (**) +4 cm 29.8 34.3  10 ** +4 cm 32.5 37.0  11 ** +4 cm  32.0 36.4  12 ** +4 cm 33.6 36.0                Total Limb Volume: ml 2482.2 ml 3209.0 ml    Limb volume differential % L 22.6 % larger than R     Volume change from initial V     Self Care: -Pamphlet issued for pt/daughter to review lymphedema and treatment plan.   -FOTO given; see above.   -Initiated tx with layered compression bandaging.  OT applied layers as follows, extending from distal L hand at the base of MCPs to upper arm ending at axillary crease:  - 3" stockinette   - 1 Rosidal 10 cm x 2.5 m x 0.4 cm soft foam roll  - 1 Rosidal K 6 cm x 5 m bandage anchored at the L hand MCPs, ending at the proximal forearm   - 1 Rosidal K 8 cm x 5 m bandage anchored at the L wrist, ending at the upper arm  -1 Rosidal K 10 cm x 5 m bandage anchored at the L mid forearm, ending at the upper arm near axillary crease.  - Secured with tape and tetra-net up the arm -Instructed pt/daughter in bandaging technique, wearing schedule (23 hrs per day, remove as needed for bathing, then re-bandage), care of bandages.  -Educated on removing bandages if itching occurs with a possible histamine reaction and can use otc antihistamine as needed.  Wash arm, apply lotion, dry, then re-bandage with 1 less bandage.  Therapeutic Exercise: -With compression bandages donned, instructed pt in A/AAROM throughout the LUE, including active L hand digit flex/ext, L wrist flex/ext, elbow flex/ext, and shoulder flexion with support from R  hand at the L wrist to perform AAROM d/t heaviness of L arm.  Able to return demo. Encouraged use of stress ball at home, to use with bandages donned, for promoting lymph flow in the hand.  Pt/daughter verbalized understanding.  PATIENT EDUCATION:  Education details: Disease management; layed compression bandages wearing schedule, care of bandages, wrapping technique Person educated: Patient and Child(ren) (daughter, Lowella Bandy) Education method: Explanation, demo; daughter used phone to take picture images for reference Education comprehension: verbalized understanding and needs further education  HOME EXERCISE PROGRAM: Initiated LUE A/AAROM  ASSESSMENT:  CLINICAL IMPRESSION: Pt arrived with daughter and reported she had had a good appointment with [name] earlier today.  Pt reports she will restart her daily chemo pill on Thurs of this week.  Initial volumetrics taken, noting LUE to be 22.6% larger than the RUE.  Initiated compression bandaging and applied bandages as noted above with daughter present for training on bandaging technique.  Advised pt that the goal would be to keep bandages on her arm until her next OT tx session on Thurs, but advised on removing them should bandages become loose to the point of being ineffective, if arm becomes painful, or if pt experiences any uncontrollable itching.  Pt may then remove bandages, shower, and re-apply within the hour with assist from daughter as noted above.  Good initial tolerance for bandages this date and good return demo of A/AAROM throughout the LUE.  Pt will continue to benefit from skilled OT to provide manual lymphatic drainage, therapeutic exercises/HEP progression, education on skin care and disease management, educ on possible use of vasopneumatic pump, layered compression bandaging to the LUE, and fitting for maintenance compression garment in order to reduce and later maintain LUE limb volume, promote healthy skin integrity, maximize  ROM/strength, and increase functional  use of the LUE for daily tasks.  Pt/daughter continue to be in agreement with poc.   OBJECTIVE IMPAIRMENTS: decreased activity tolerance, decreased endurance, decreased knowledge of condition, decreased knowledge of use of DME, decreased mobility, decreased ROM, decreased strength, hypomobility, increased edema, increased fascial restrictions, impaired flexibility, impaired sensation, impaired UE functional use, improper body mechanics, postural dysfunction, obesity, and pain.   ACTIVITY LIMITATIONS: carrying, lifting, bending, standing, squatting, sleeping, stairs, transfers, bed mobility, bathing, toileting, dressing, reach over head, hygiene/grooming, and locomotion level  PARTICIPATION LIMITATIONS: meal prep, cleaning, laundry, medication management, driving, shopping, and community activity  PERSONAL FACTORS: Age, Education, Fitness, Past/current experiences, Time since onset of injury/illness/exacerbation, and 3+ comorbidities: stage 4 ca, L mastectomy, lymph node removal/radiation, anxiety, depression, cardiac hx (see chart), obesity  are also affecting patient's functional outcome.   REHAB POTENTIAL: Fair for goals  CLINICAL DECISION MAKING: Unstable/unpredictable  EVALUATION COMPLEXITY: High  GOALS: Goals reviewed with patient? Yes  SHORT TERM GOALS: Target date: 10/25/22  (6 weeks)  Pt will perform HEP with set up/min vc for promoting lymph flow and LUE flexibility for better engagement of the LUE with daily tasks. Baseline: Eval: HEP not yet initiated Goal status: INITIAL  2.  Pt will demo accurate technique to apply and wear layered compression bandages as instructed on the LUE 23 hours/day with 75% or better consistency in order to reduce LUE limb volume.   Baseline: Eval: Will plan to issue and begin bandaging/educ in the next 1-2 sessions.  Goal status: Initial  3. Pt/caregiver will be indep to verbalize 3-4 lymphedema precautions to  prevent exacerbations and/or infections.  Baseline: Eval: educ not yet initiated.  Goal status: Initial   LONG TERM GOALS: Target date: 12/06/22 (12 weeks)  Pt will increase FOTO score to (TBD) or better to indicate improvement in self perceived functional use of the L arm with daily tasks. Baseline: Eval: FOTO to be completed next session (time constraints at eval) Goal status: INITIAL  2. Pt/caregiver will demo LUE MLD techniques with min vc or less and use of visual handout to promote fluid mobilization in the LUE.     Baseline: Eval: Educ not yet initiated  Goal status: Initial  3. Pt to measure 8-10% or more limb volume reduction in the LUE in order to promote healthy skin integrity, reduce infection risk, improve fit of clothing, and ease ability to engage the LUE into daily tasks.  Baseline: Eval: initial circumferential measures indicate LUE to be ~15% larger than RUE, but will plan to measure actual volumetrics in upcoming tx session. Goal status: Initial  4. Pt to be fitted with LUE compression garment for long-term maintenance of fluid loss in the LUE.  Baseline: Eval: to be fit during last phase of CDT program  Goal status: Initial  PLAN:  PT FREQUENCY: 2x/week  PT DURATION: 12 weeks  PLANNED INTERVENTIONS: Therapeutic exercises, Therapeutic activity, Patient/Family education, Self Care, DME instructions, Manual lymph drainage, Compression bandaging, scar mobilization, Taping, Vasopneumatic device, and Manual therapy  PLAN FOR NEXT SESSION: see above  Danelle Earthly, MS, OTR/L, CLT  Otis Dials, OT 09/19/2022, 5:32 PM

## 2022-09-20 ENCOUNTER — Ambulatory Visit

## 2022-09-20 DIAGNOSIS — M6281 Muscle weakness (generalized): Secondary | ICD-10-CM | POA: Diagnosis not present

## 2022-09-20 DIAGNOSIS — A419 Sepsis, unspecified organism: Secondary | ICD-10-CM | POA: Diagnosis not present

## 2022-09-20 DIAGNOSIS — I719 Aortic aneurysm of unspecified site, without rupture: Secondary | ICD-10-CM | POA: Diagnosis not present

## 2022-09-20 DIAGNOSIS — R278 Other lack of coordination: Secondary | ICD-10-CM

## 2022-09-20 DIAGNOSIS — I10 Essential (primary) hypertension: Secondary | ICD-10-CM | POA: Diagnosis not present

## 2022-09-20 DIAGNOSIS — G4733 Obstructive sleep apnea (adult) (pediatric): Secondary | ICD-10-CM | POA: Diagnosis not present

## 2022-09-20 DIAGNOSIS — J189 Pneumonia, unspecified organism: Secondary | ICD-10-CM | POA: Diagnosis not present

## 2022-09-20 DIAGNOSIS — I972 Postmastectomy lymphedema syndrome: Secondary | ICD-10-CM

## 2022-09-20 DIAGNOSIS — N390 Urinary tract infection, site not specified: Secondary | ICD-10-CM | POA: Diagnosis not present

## 2022-09-23 NOTE — Therapy (Signed)
OUTPATIENT OT UPPER EXTREMITY LYMPHEDEMA TREATMENT NOTE  Patient Name: Alicia [name] MRN: [id number] DOB:1950/02/26, 72 y.o., female Today's Date: 09/23/2022  END OF SESSION:   OT End of Session - 09/23/22 1046     Visit Number 3    Number of Visits 24    Date for OT Re-Evaluation 12/06/22    Progress Note Due on Visit 10    OT Start Time 1610    OT Stop Time 1705    OT Time Calculation (min) 55 min    Equipment Utilized During Treatment transport chair    Activity Tolerance Patient tolerated treatment well    Behavior During Therapy WFL for tasks assessed/performed            Past Medical History:  Diagnosis Date   Anxiety    Breast cancer, left (HCC) 07/2017   Depression    Hypertension    Hypothyroidism    Personal history of chemotherapy 2019   LEFT mastectomy-chemo before   Personal history of radiation therapy 03/2018   LEFT mastectomy   Rapid heart rate    Thyroid disease    Past Surgical History:  Procedure Laterality Date   AXILLARY LYMPH NODE BIOPSY Left 07/16/2017   METASTATIC MAMMARY CARCINOMA   BREAST BIOPSY Left 07/16/2017   [place] bx of left breast mass and left breast LN.  INVASIVE MAMMARY CARCINOMA, NO SPECIAL TYPE.    BREAST EXCISIONAL BIOPSY Right 2001   benign   BREAST LUMPECTOMY WITH SENTINEL LYMPH NODE BIOPSY Left 12/06/2017   Procedure: BREAST LUMPECTOMY WITH SENTINEL LYMPH NODE BX;  Surgeon: Earline Mayotte, MD;  Location: [facility];  Service: General;  Laterality: Left;   COLONOSCOPY     MASTECTOMY Left 12/23/2017   PORTACATH PLACEMENT Right 08/07/2017   Procedure: INSERTION PORT-A-CATH;  Surgeon: Earline Mayotte, MD;  Location: [facility];  Service: General;  Laterality: Right;   SIMPLE MASTECTOMY WITH AXILLARY SENTINEL NODE BIOPSY Left 12/23/2017   T2,N2 with 6/7 nodes positive. Whole breast radiation.  Surgeon: Earline Mayotte, MD;  Location: [facility];  Service: General;  Laterality: Left;   TONSILLECTOMY     Patient Active  Problem List   Diagnosis Date Noted   Pneumonia 07/24/2022   Palliative care encounter 07/24/2022   Malnutrition of moderate degree 07/20/2022   Acute UTI 07/20/2022   Pressure injury of skin 07/20/2022   Hypokalemia 07/19/2022   Hypophosphatemia 07/19/2022   Acute metabolic encephalopathy 07/19/2022   Generalized weakness 07/18/2022   AKI (acute kidney injury) (HCC) 07/18/2022   Electrolyte abnormality 07/18/2022   QT prolongation 07/18/2022   Diarrhea 07/18/2022   Altered mental status 07/18/2022   Contact dermatitis 05/10/2021   Hematuria 03/31/2021   Urinary tract infection without hematuria 03/01/2021   PSVT (paroxysmal supraventricular tachycardia) 02/20/2020   Mobitz type 1 second degree AV block 02/20/2020   Pure hypercholesterolemia 02/20/2020   Daytime somnolence 02/18/2020   Chest pain 11/13/2019   Palpitations 11/13/2019   Obesity (BMI 35.0-39.9 without comorbidity) 11/08/2019   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 Corky Downs  REFERRING PROVIDER: Clent Jacks, PA-C  REFERRING DIAG:  Diagnosis  R60.0 (ICD-10-CM) -  Edema of left upper extremity  C50.912,C79.51 (ICD-10-CM) - Carcinoma of left breast metastatic to bone (HCC)  I89.0 (ICD-10-CM) - Lymphedema   THERAPY DIAG:  Muscle weakness (generalized)  Other lack of coordination  Postmastectomy lymphedema syndrome  ONSET DATE: 07/2022  Rationale for Evaluation and Treatment: Rehabilitation  SUBJECTIVE                                                                                                                                                                                           SUBJECTIVE STATEMENT: Pt reports that she was able to keep her bandages on since being wrapped last session, though she stated her arm began to itch today.  OT reviewed options to address any itching, including removing bandages to wash/lotion arm, taking an antihistamine, then reapplying 1 less bandage).  Pt verbalized understanding. Accompanied by: Linna Hoff.  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  LUE lymphedema, BLE edema.  Per chart from [name] on 08/15/22: ONCOLOGY HISTORY: Patient initially received neoadjuvant chemotherapy with Adriamycin and Cytoxan followed by weekly Taxol. She only received 4 cycles of weekly Taxol prior to discontinuation of treatment on October 31, 2017 secondary to persistent peripheral neuropathy. She ultimately required mastectomy and final pathology noted 5 of 6 lymph nodes positive for disease. Patient completed adjuvant XRT in mid April 2020. Pain initiated letrozole in May 2020, this was discontinued secondary to side effects and patient was started on anastrozole. Nuclear medicine bone scan on June 19, 2019 revealed metastatic disease and anastrozole was subsequently discontinued. PET scan results from October 02, 2021 revealed no clear evidence of metastatic progression. PET scan results from June 30, 2019 reviewed independently with metastatic bony disease, but no obvious evidence of visceral disease. MRI of the brain on August 24, 2019 reviewed independently with no obvious evidence of metastatic disease.   Pt reports that she was hospitalized in July for a UTI which resulted in sepsis.  Pt was discharged from the hospital and sent to rehab at [facility] where she states her lymphedema worsened there at the end of July.  Pt reports she has been given 2 different antibiotics, and she has 2 days left of Doxycycline, though she reports she  has a refill.  OT encouraged pt/family follow up with MD to see if they would like her to refill the medication, as pt does still have mild heat radiating from the forearm and pinkish tint to this area.  Pt has also had recent BP med changes, stating that she was on hydrochlorothiazide, got switched to lasix, and now is back on hydrochlorothiazide.  Pt returns for cardiology follow up Sept 13th and she reports the swelling in her BLEs is new.   Per chart from visit with Clent Jacks, PA on 09/12/22:   Patient is here to follow up on left arm swelling and pain. She has had lymphedema for quite some time secondary to her breast surgeries/lymph node removal. After being in the hospital for altered mental status/dehydration/AKI and rehabilitation, the arm swelling and pain worsened. At her last visit on 09/05/2022 a doppler was performed which showed no evidence of DVT of arm. She was started on a 10 day course of doxycyline.    Today she reports that the swelling is about the same, if not worse. She has also developed gradually worsening peripheral edema since her rehabilitation facility switched her off of hydrochlorothiazide 12.5 mg onto lasix (unknown dose). She denies SOB but is having trouble ambulating due to the swelling and discomfort. She is unsure about weight changes. She has been fluid restricting.   PAIN:  Are you having pain? Yes NPRS scale: 1-3/10 Pain location: LUE Pain orientation: Left  PAIN TYPE: aching, tight, and tingling, heavy Pain description: constant  Aggravating factors: reaching up Relieving factors: rest, elevating the arm  PRECAUTIONS: None  WEIGHT BEARING RESTRICTIONS: No  FALLS:  Has patient fallen in last 6 months? No  LIVING ENVIRONMENT: Lives with: lives with their family, lives with Lowella Bandy, daughter (who works from home), and son, [name] Lives in: 2 level home Stairs: Yes: Internal: 1 flight steps; on right going up and bilateral but cannot reach both and  External: 1 steps; none Has following equipment at home: Dan Humphreys - 2 wheeled and [employer] - 4 wheeled (uses 2 wheeled downstairs and 4 wheeled upstairs on carpet)  OCCUPATION: retired  LEISURE: watch tv, read  PRIOR LEVEL OF FUNCTION: Independent and living alone until her d/c from [facility] rehab end of July 2024, at which point pt moved in with daughter and son d/t requiring increased level of care.  PATIENT GOALS: "Reduce the swelling in my L arm and my legs and build up my strength."  OBJECTIVE  COGNITION:  Overall cognitive status: Within functional limits for tasks assessed ; 0x4, but does appear slightly forgetful  PALPATION: LUE skin is soft in the upper arm, more taut in the forearm and hand, noting at least 2+ pitting edema in the L forearm, hand, and BLEs.  L forearm is slightly warm to the touch and has a pinkish tint as compared to R unaffected side.  OBSERVATIONS: Pt and daughter pleasant, cooperative, and receptive to initial discussion of care plan.   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  LUE does appear quite heavy, with pt using R hand to cradle the arm at times.   HAND DOMINANCE: Right  UPPER EXTREMITY AROM:  AROM RIGHT   eval   Shoulder extension   Shoulder flexion WFL  Shoulder abduction [facility]  Shoulder internal rotation WNL  Shoulder external rotation WFL    (Blank rows = not tested)  AROM LEFT   eval  Shoulder extension   Shoulder flexion 111*  Shoulder abduction 100*  Shoulder internal rotation WNL  Shoulder external rotation 85* with arm abd at 90*    (Blank rows =  not tested)   CERVICAL AROM: NT at eval     Flexion   Extension   Right lateral  flexion   Left lateral flexion   Right rotation   Left rotation     UPPER EXTREMITY STRENGTH:  R shoulder flex/abd: 4-; L shoulder flex 3+ (within available range), L shoulder abd 4- (within available range) R/L elbow flex/ext: 4+;  R/L wrist flex/ext 4+  LOWER EXTREMITY AROM/PROM: NT  LYMPHEDEMA ASSESSMENTS: see circumferential measurements noted below; additional testing to be completed in upcoming sessions as needed  SURGERY TYPE/DATE: L mastectomy 12/23/2017  NUMBER OF LYMPH NODES REMOVED: 6-7 L axillary lymph nodes removed  CHEMOTHERAPY: normally takes a chemo pill daily but currently paused until pt can get her strength back.    RADIATION: 3 rounds of radiation, with last tx being May 31st 2024; radiation targeting L hip and pelvis.  Pt reports 16 total treatments in the pelvic area.  HORMONE TREATMENT: had been taking a hormone replacement pill but recently stopped in the hospital; may restart it, per pt/daughter  INFECTIONS: question for LUE cellulitis and subsequently given ABX x2  -Pt reports she did have a sacral pu from recent hospitalization but cleared up at [facility], and denied infection.  LYMPHEDEMA ASSESSMENTS: circumferential measurements in cm   LANDMARK RIGHT eval  10 cm proximal to olecranon process 31.3 cm  Olecranon process 27.0 cm  10 cm proximal to ulnar styloid process 20.2 cm  Just proximal to ulnar styloid process 15.9 cm  Across hand at thumb web space 20.6 cm  At base of 3rd digit 6.5 cm    LANDMARK LEFT  eval  10 cm proximal to olecranon process 38.0 cm  Olecranon process 31.8 cm  10 cm proximal to ulnar styloid process 26.5 cm  Just proximal to ulnar styloid process 19.8 cm  Across hand at thumb web space 21.5 cm  At base of 3rd digit 6.6 cm    FUNCTIONAL TESTS: none  GAIT:  Distance walked: NT Assistive device utilized:  transport chair, RW Level of assistance: Modified independence short distances inside the home on level ground with  RW  PATIENT SURVEYS:  FOTO To complete next session 09/18/22: 56; predicted 67  TODAY'S TREATMENT:                                                                                                                                         DATE: 09/20/22 Self Care: -Doffed RUE bandages and completed skin care.  Arm washed, lotion applied.  Issued 2nd set of bandages so pt may have 1 set to wash, 1 to wear.  Educated on importance of skin care to prevent infection, avoiding cuts/scrapes/bug bites/dry/cracked skin.  Re-bandaged following MDL, reviewing bandaging technique with grandson (a second caregiver).  -OT applied layers as follows, extending from distal L hand at the base of MCPs to upper arm ending at axillary  crease:  - 3" stockinette  - small foam piece and chip back placed to elbow crease where pt had scratched when bandages were removed at start of session (reviewed importance of avoiding deep scratching to avoid breaks in skin for infection prevention).  - 1 Rosidal 10 cm x 2.5 m x 0.4 cm soft foam roll extending from base of MCPs to upper arm  - 1 Rosidal K 6 cm x 5 m bandage anchored at the L hand MCPs, ending at the proximal forearm   - 1 Rosidal K 8 cm x 5 m bandage anchored at the L wrist, ending at the upper arm  -1 Rosidal K 10 cm x 5 m bandage anchored at the L mid forearm, ending at the upper arm near axillary crease.  - Secured with tape and tetra-net up the arm  Manual Therapy: Performed MLD technique throughout the LUE, routing fluid from LUE across chest to functioning pathways at the R axillary nodes.  Concentrated MLD at the L forearm, dorsal hand, and digits, where fluid build up is more pronounced.  Instructed pt in self MLD technique, and encouraged self MLD at the fingers and upper arm/shoulder, chest when LUE is bandaged.  Pt verbalized understanding.  PATIENT EDUCATION:  Education details: Disease management; layed compression bandages wearing schedule, care of bandages,  wrapping technique, self MLD technique Person educated: Patient and Child(ren), grandson, Ryan Education method: Explanation, demo Education comprehension: verbalized understanding and needs further education  HOME EXERCISE PROGRAM: Initiated LUE A/AAROM  ASSESSMENT:  CLINICAL IMPRESSION: Pt with excellent adherence to initial bandaging schedule, having kept bandages applied since last session.  Pt reported some itching in the L arm today, but verbalized understanding of managing this symptom after review today (see above note).  When bandages were doffed during session, pt immediately began scratching, with OT quickly reviewing importance of avoiding deep scratching to promote good skin care (see above).  Extra padding applied to elbow crease when bandaged at end of session to protect reddened area where pt had scratched.  Significant improvement noted in swelling throughout the LUE this date, noting only mild pitting (1+ in the L forearm and hand, as compared to at least 2+ prior to bandaging).  Pt did not report any increased urinary frequency, but did report increased output whenever she did urinate since having bandages applied.  L upper arm more supple and pt tolerated initial MLD well throughout the LUE.  L hand digits remain swollen.  Instructed pt in self MLD for the fingers, which she was encouraged to do intermittently throughout the day when bandages are donned.  If self MLD to the digits does not improve swelling, will plan to try compression glove beneath bandages next session.  Pt's grandson present today, and was educated on bandaging technique, as he is another caregiver in the home that may be intermittently available to assist pt, and he does plan to accompany pt to upcoming appointments when pt's daughter is unable to attend.  Multiple family members remain supportive and active in pt's care plan.  Pt will continue to benefit from skilled OT to provide manual lymphatic drainage,  therapeutic exercises/HEP progression, education on skin care and disease management, educ on possible use of vasopneumatic pump, layered compression bandaging to the LUE, and fitting for maintenance compression garment in order to reduce and later maintain LUE limb volume, promote healthy skin integrity, maximize ROM/strength, and increase functional use of the LUE for daily tasks.  Pt/family continue to be in  agreement with poc.   OBJECTIVE IMPAIRMENTS: decreased activity tolerance, decreased endurance, decreased knowledge of condition, decreased knowledge of use of DME, decreased mobility, decreased ROM, decreased strength, hypomobility, increased edema, increased fascial restrictions, impaired flexibility, impaired sensation, impaired UE functional use, improper body mechanics, postural dysfunction, obesity, and pain.   ACTIVITY LIMITATIONS: carrying, lifting, bending, standing, squatting, sleeping, stairs, transfers, bed mobility, bathing, toileting, dressing, reach over head, hygiene/grooming, and locomotion level  PARTICIPATION LIMITATIONS: meal prep, cleaning, laundry, medication management, driving, shopping, and community activity  PERSONAL FACTORS: Age, Education, Fitness, Past/current experiences, Time since onset of injury/illness/exacerbation, and 3+ comorbidities: stage 4 ca, L mastectomy, lymph node removal/radiation, anxiety, depression, cardiac hx (see chart), obesity  are also affecting patient's functional outcome.   REHAB POTENTIAL: Fair for goals  CLINICAL DECISION MAKING: Unstable/unpredictable  EVALUATION COMPLEXITY: High  GOALS: Goals reviewed with patient? Yes  SHORT TERM GOALS: Target date: 10/25/22  (6 weeks)  Pt will perform HEP with set up/min vc for promoting lymph flow and LUE flexibility for better engagement of the LUE with daily tasks. Baseline: Eval: HEP not yet initiated Goal status: INITIAL  2.  Pt will demo accurate technique to apply and wear layered  compression bandages as instructed on the LUE 23 hours/day with 75% or better consistency in order to reduce LUE limb volume.   Baseline: Eval: Will plan to issue and begin bandaging/educ in the next 1-2 sessions.  Goal status: Initial  3. Pt/caregiver will be indep to verbalize 3-4 lymphedema precautions to prevent exacerbations and/or infections.  Baseline: Eval: educ not yet initiated.  Goal status: Initial   LONG TERM GOALS: Target date: 12/06/22 (12 weeks)  Pt will increase FOTO score to (TBD) or better to indicate improvement in self perceived functional use of the L arm with daily tasks. Baseline: Eval: FOTO to be completed next session (time constraints at eval) Goal status: INITIAL  2. Pt/caregiver will demo LUE MLD techniques with min vc or less and use of visual handout to promote fluid mobilization in the LUE.     Baseline: Eval: Educ not yet initiated  Goal status: Initial  3. Pt to measure 8-10% or more limb volume reduction in the LUE in order to promote healthy skin integrity, reduce infection risk, improve fit of clothing, and ease ability to engage the LUE into daily tasks.  Baseline: Eval: initial circumferential measures indicate LUE to be ~15% larger than RUE, but will plan to measure actual volumetrics in upcoming tx session. Goal status: Initial  4. Pt to be fitted with LUE compression garment for long-term maintenance of fluid loss in the LUE.  Baseline: Eval: to be fit during last phase of CDT program  Goal status: Initial  PLAN:  PT FREQUENCY: 2x/week  PT DURATION: 12 weeks  PLANNED INTERVENTIONS: Therapeutic exercises, Therapeutic activity, Patient/Family education, Self Care, DME instructions, Manual lymph drainage, Compression bandaging, scar mobilization, Taping, Vasopneumatic device, and Manual therapy  PLAN FOR NEXT SESSION: see above  Danelle Earthly, MS, OTR/L, CLT  Otis Dials, OT 09/23/2022, 10:49 AM

## 2022-09-24 ENCOUNTER — Telehealth: Payer: Self-pay | Admitting: *Deleted

## 2022-09-24 NOTE — Telephone Encounter (Signed)
Verbal order called to occupational therapist

## 2022-09-24 NOTE — Telephone Encounter (Signed)
Occupational therapy called and requests that orders be approved for occupational therapy 1 wk 6. Please advise

## 2022-09-25 ENCOUNTER — Ambulatory Visit

## 2022-09-25 DIAGNOSIS — M6281 Muscle weakness (generalized): Secondary | ICD-10-CM

## 2022-09-25 DIAGNOSIS — I972 Postmastectomy lymphedema syndrome: Secondary | ICD-10-CM | POA: Diagnosis not present

## 2022-09-25 DIAGNOSIS — R278 Other lack of coordination: Secondary | ICD-10-CM | POA: Diagnosis not present

## 2022-09-27 ENCOUNTER — Ambulatory Visit

## 2022-09-27 DIAGNOSIS — J189 Pneumonia, unspecified organism: Secondary | ICD-10-CM | POA: Diagnosis not present

## 2022-09-27 DIAGNOSIS — I10 Essential (primary) hypertension: Secondary | ICD-10-CM | POA: Diagnosis not present

## 2022-09-27 DIAGNOSIS — A419 Sepsis, unspecified organism: Secondary | ICD-10-CM | POA: Diagnosis not present

## 2022-09-27 DIAGNOSIS — I719 Aortic aneurysm of unspecified site, without rupture: Secondary | ICD-10-CM | POA: Diagnosis not present

## 2022-09-27 DIAGNOSIS — N390 Urinary tract infection, site not specified: Secondary | ICD-10-CM | POA: Diagnosis not present

## 2022-09-27 DIAGNOSIS — G4733 Obstructive sleep apnea (adult) (pediatric): Secondary | ICD-10-CM | POA: Diagnosis not present

## 2022-09-27 NOTE — Therapy (Signed)
OUTPATIENT OT UPPER EXTREMITY LYMPHEDEMA TREATMENT NOTE  Patient Name: Alicia [name] MRN: [id number] DOB:Jul 01, 1950, 72 y.o., female Today's Date: 09/27/2022  END OF SESSION:   OT End of Session - 09/27/22 1036     Visit Number 4    Number of Visits 24    Date for OT Re-Evaluation 12/06/22    Progress Note Due on Visit 10    OT Start Time 1440    OT Stop Time 1530    OT Time Calculation (min) 50 min    Equipment Utilized During Treatment transport chair    Activity Tolerance Patient tolerated treatment well    Behavior During Therapy WFL for tasks assessed/performed            Past Medical History:  Diagnosis Date   Anxiety    Breast cancer, left (HCC) 07/2017   Depression    Hypertension    Hypothyroidism    Personal history of chemotherapy 2019   LEFT mastectomy-chemo before   Personal history of radiation therapy 03/2018   LEFT mastectomy   Rapid heart rate    Thyroid disease    Past Surgical History:  Procedure Laterality Date   AXILLARY LYMPH NODE BIOPSY Left 07/16/2017   METASTATIC MAMMARY CARCINOMA   BREAST BIOPSY Left 07/16/2017   [place] bx of left breast mass and left breast LN.  INVASIVE MAMMARY CARCINOMA, NO SPECIAL TYPE.    BREAST EXCISIONAL BIOPSY Right 2001   benign   BREAST LUMPECTOMY WITH SENTINEL LYMPH NODE BIOPSY Left 12/06/2017   Procedure: BREAST LUMPECTOMY WITH SENTINEL LYMPH NODE BX;  Surgeon: Earline Mayotte, MD;  Location: [facility];  Service: General;  Laterality: Left;   COLONOSCOPY     MASTECTOMY Left 12/23/2017   PORTACATH PLACEMENT Right 08/07/2017   Procedure: INSERTION PORT-A-CATH;  Surgeon: Earline Mayotte, MD;  Location: [facility];  Service: General;  Laterality: Right;   SIMPLE MASTECTOMY WITH AXILLARY SENTINEL NODE BIOPSY Left 12/23/2017   T2,N2 with 6/7 nodes positive. Whole breast radiation.  Surgeon: Earline Mayotte, MD;  Location: [facility];  Service: General;  Laterality: Left;   TONSILLECTOMY     Patient Active  Problem List   Diagnosis Date Noted   Pneumonia 07/24/2022   Palliative care encounter 07/24/2022   Malnutrition of moderate degree 07/20/2022   Acute UTI 07/20/2022   Pressure injury of skin 07/20/2022   Hypokalemia 07/19/2022   Hypophosphatemia 07/19/2022   Acute metabolic encephalopathy 07/19/2022   Generalized weakness 07/18/2022   AKI (acute kidney injury) (HCC) 07/18/2022   Electrolyte abnormality 07/18/2022   QT prolongation 07/18/2022   Diarrhea 07/18/2022   Altered mental status 07/18/2022   Contact dermatitis 05/10/2021   Hematuria 03/31/2021   Urinary tract infection without hematuria 03/01/2021   PSVT (paroxysmal supraventricular tachycardia) 02/20/2020   Mobitz type 1 second degree AV block 02/20/2020   Pure hypercholesterolemia 02/20/2020   Daytime somnolence 02/18/2020   Chest pain 11/13/2019   Palpitations 11/13/2019   Obesity (BMI 35.0-39.9 without comorbidity) 11/08/2019   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 Corky Downs  REFERRING PROVIDER: Clent Jacks, PA-C  REFERRING DIAG:  Diagnosis  R60.0 (ICD-10-CM) -  Edema of left upper extremity  C50.912,C79.51 (ICD-10-CM) - Carcinoma of left breast metastatic to bone (HCC)  I89.0 (ICD-10-CM) - Lymphedema   THERAPY DIAG:  Muscle weakness (generalized)  Postmastectomy lymphedema syndrome  Other lack of coordination  ONSET DATE: 07/2022  Rationale for Evaluation and Treatment: Rehabilitation  SUBJECTIVE                                                                                                                                                                                           SUBJECTIVE STATEMENT: Pt reports consistent wearing of her compression bandages since last seen by OT, and has been sponge bathing to allow L arm to stay bandaged.  Accompanied by: Linna Hoff.  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  LUE lymphedema, BLE edema.  Per chart from [name] on 08/15/22: ONCOLOGY HISTORY: Patient initially received neoadjuvant chemotherapy with Adriamycin and Cytoxan followed by weekly Taxol. She only received 4 cycles of weekly Taxol prior to discontinuation of treatment on October 31, 2017 secondary to persistent peripheral neuropathy. She ultimately required mastectomy and final pathology noted 5 of 6 lymph nodes positive for disease. Patient completed adjuvant XRT in mid April 2020. Pain initiated letrozole in May 2020, this was discontinued secondary to side effects and patient was started on anastrozole. Nuclear medicine bone scan on June 19, 2019 revealed metastatic disease and anastrozole was subsequently discontinued. PET scan results from October 02, 2021 revealed no clear evidence of metastatic progression. PET scan results from June 30, 2019 reviewed independently with metastatic bony disease, but no obvious evidence of visceral disease. MRI of the brain on August 24, 2019 reviewed independently with no obvious evidence of metastatic disease.   Pt reports that she was hospitalized in July for a UTI which resulted in sepsis.  Pt was discharged from the hospital and sent to rehab at [facility] where she states her lymphedema worsened there at the end of July.  Pt reports she has been given 2 different antibiotics, and she has 2 days left of Doxycycline, though she reports she has a refill.  OT encouraged pt/family follow up with MD to see if they would like her to refill the medication, as pt does still have mild heat radiating from the forearm  and pinkish tint to this area.  Pt has also had recent BP med changes, stating that she was on hydrochlorothiazide, got switched to lasix, and now is back on hydrochlorothiazide.  Pt returns for cardiology follow up Sept 13th and she reports the swelling in her BLEs is new.   Per chart from visit with  Clent Jacks, PA on 09/12/22:   Patient is here to follow up on left arm swelling and pain. She has had lymphedema for quite some time secondary to her breast surgeries/lymph node removal. After being in the hospital for altered mental status/dehydration/AKI and rehabilitation, the arm swelling and pain worsened. At her last visit on 09/05/2022 a doppler was performed which showed no evidence of DVT of arm. She was started on a 10 day course of doxycyline.    Today she reports that the swelling is about the same, if not worse. She has also developed gradually worsening peripheral edema since her rehabilitation facility switched her off of hydrochlorothiazide 12.5 mg onto lasix (unknown dose). She denies SOB but is having trouble ambulating due to the swelling and discomfort. She is unsure about weight changes. She has been fluid restricting.   PAIN: 09/25/22: Pt denies any LUE pain this date Are you having pain? Yes NPRS scale: 1-3/10 Pain location: LUE Pain orientation: Left  PAIN TYPE: aching, tight, and tingling, heavy Pain description: constant  Aggravating factors: reaching up Relieving factors: rest, elevating the arm  PRECAUTIONS: None  WEIGHT BEARING RESTRICTIONS: No  FALLS:  Has patient fallen in last 6 months? No  LIVING ENVIRONMENT: Lives with: lives with their family, lives with Lowella Bandy, daughter (who works from home), and son, [name] Lives in: 2 level home Stairs: Yes: Internal: 1 flight steps; on right going up and bilateral but cannot reach both and External: 1 steps; none Has following equipment at home: Dan Humphreys - 2 wheeled and [employer] - 4 wheeled (uses 2 wheeled downstairs and 4  wheeled upstairs on carpet)  OCCUPATION: retired  LEISURE: watch tv, read  PRIOR LEVEL OF FUNCTION: Independent and living alone until her d/c from [facility] rehab end of July 2024, at which point pt moved in with daughter and son d/t requiring increased level of care.  PATIENT GOALS: "Reduce the swelling in my L arm and my legs and build up my strength."  OBJECTIVE  COGNITION:  Overall cognitive status: Within functional limits for tasks assessed ; 0x4, but does appear slightly forgetful  PALPATION: LUE skin is soft in the upper arm, more taut in the forearm and hand, noting at least 2+ pitting edema in the L forearm, hand, and BLEs.  L forearm is slightly warm to the touch and has a pinkish tint as compared to R unaffected side.  OBSERVATIONS: Pt and daughter pleasant, cooperative, and receptive to initial discussion of care plan.   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  LUE does appear quite heavy, with pt using R hand to cradle the arm at times.   HAND DOMINANCE: Right  UPPER EXTREMITY AROM:  AROM RIGHT   eval   Shoulder extension   Shoulder flexion WFL  Shoulder abduction [facility]  Shoulder internal rotation WNL  Shoulder external rotation WFL    (Blank rows = not tested)  AROM LEFT   eval  Shoulder extension   Shoulder flexion 111*  Shoulder abduction 100*  Shoulder internal rotation WNL  Shoulder external rotation 85* with arm abd at 90*    (Blank rows = not tested)   CERVICAL AROM: NT at eval     Flexion   Extension  Right lateral flexion   Left lateral flexion   Right rotation   Left rotation     UPPER EXTREMITY STRENGTH:  R shoulder flex/abd: 4-; L  shoulder flex 3+ (within available range), L shoulder abd 4- (within available range) R/L elbow flex/ext: 4+;  R/L wrist flex/ext 4+  LOWER EXTREMITY AROM/PROM: NT  LYMPHEDEMA ASSESSMENTS: see circumferential measurements noted below; additional testing to be completed in upcoming sessions as needed  SURGERY TYPE/DATE: L mastectomy 12/23/2017  NUMBER OF LYMPH NODES REMOVED: 6-7 L axillary lymph nodes removed  CHEMOTHERAPY: normally takes a chemo pill daily but currently paused until pt can get her strength back.    RADIATION: 3 rounds of radiation, with last tx being May 31st 2024; radiation targeting L hip and pelvis.  Pt reports 16 total treatments in the pelvic area.  HORMONE TREATMENT: had been taking a hormone replacement pill but recently stopped in the hospital; may restart it, per pt/daughter  INFECTIONS: question for LUE cellulitis and subsequently given ABX x2  -Pt reports she did have a sacral pu from recent hospitalization but cleared up at [facility], and denied infection.  LYMPHEDEMA ASSESSMENTS: circumferential measurements in cm   LANDMARK RIGHT eval  10 cm proximal to olecranon process 31.3 cm  Olecranon process 27.0 cm  10 cm proximal to ulnar styloid process 20.2 cm  Just proximal to ulnar styloid process 15.9 cm  Across hand at thumb web space 20.6 cm  At base of 3rd digit 6.5 cm    LANDMARK LEFT  eval  10 cm proximal to olecranon process 38.0 cm  Olecranon process 31.8 cm  10 cm proximal to ulnar styloid process 26.5 cm  Just proximal to ulnar styloid process 19.8 cm  Across hand at thumb web space 21.5 cm  At base of 3rd digit 6.6 cm    FUNCTIONAL TESTS: none  GAIT:  Distance walked: NT Assistive device utilized:  transport chair, RW Level of assistance: Modified independence short distances inside the home on level ground with RW  PATIENT SURVEYS:  FOTO To complete next session 09/18/22: 56; predicted 67  TODAY'S TREATMENT:                                                                                                                                          DATE: 09/25/22 Therapeutic Exercise: Instructed pt in self AAROM with hands clasped to perform L shoulder flex in sitting and supine (recommended supine position at home as pt can achieve greater end range with help of gravity).  Pt performed 2 sets 10 reps with cues to perform above exercise within a pain free range.  Advised on benefits for increasing range and mobilizing lymph fluid.  Reviewed AROM throughout the LUE, including L hand active fisting, wrist flex/ext, elbow flex/ext, and shoulder mobility as noted above.  Instructed pt in diaphragmatic breathing technique to clear deep abdominal nodes.  Pt demonstrated understanding with good technique with min vc and tactile cues.  Self Care: -Doffed RUE bandages and completed skin care.  Arm washed, lotion applied.  Applied clean set of bandages end of session.  Reviewed optimal skin care to prevent infection, avoiding cuts/scrapes/bug bites/dry/cracked skin.  Reviewed bandaging technique with grandson (a second caregiver); min vc for bandaging sequence.  Reviewed indications for removing bandages, including when bandages become loose, if LUE is itchy or needs washing, or if bandages become wet or soiled.  Reviewed wearing schedule, including ability to remove bandages at any time for above noted reasons, but to don again within an hour to continue to promote fluid mobilization within this stage of CDT.   -OT Re-applied layers as follows, extending from distal L hand at the base of MCPs to upper arm, ending at axillary crease:  - 3" stockinette  - small foam piece and chip bag at elbow crease not used today d/t improved skin integrity where pt had itched to the point of skin irritation last session. - 1 Rosidal 10 cm x 2.5 m x 0.4 cm soft foam roll extending from base of MCPs to upper arm  - 1 Rosidal K 6 cm x 5 m bandage anchored at the L hand  MCPs, ending at the proximal forearm   - 1 Rosidal K 8 cm x 5 m bandage anchored at the L wrist, ending at the upper arm  -1 Rosidal K 10 cm x 5 m bandage anchored at the L mid forearm, ending at the upper arm near axillary crease.  - Secured with tape and tetra-net up the arm  Recommendations made for ginger candy, peppermint candy/gum or essential oil to combat nausea with pt re-starting her chemo pill.    Manual Therapy: Performed MLD technique throughout the LUE, routing fluid from LUE across chest to functioning pathways at the R axillary nodes, working proximal-distal-proximal.  Concentrated MLD at the L upper arm and forearm where fluid build up is now more pronounced.  Reviewed thyroid precautions and avoiding MLD at the neck/thyroid regions.    PATIENT EDUCATION:  Education details: Disease management; layed compression bandage application technique and wearing schedule; self MLD technique Person educated: Patient and Child(ren), grandson, Archivist method: Explanation, demo Education comprehension: verbalized understanding and needs further education  HOME EXERCISE PROGRAM: LUE A/AAROM, diaphragmatic breathing, self MLD  ASSESSMENT:  CLINICAL IMPRESSION: Pt continues to demo excellent adherence to bandaging schedule, having kept bandages applied since last session.  Skin irritation from scratching at the L volar elbow last session has cleared.  Continued improvement noted in swelling throughout the LUE this date, with excellent improvement in L hand digit swelling and at the dorsum of the hand.  Pt reported that she's been diligent in performing self MLD at the digits while the LUE remained bandaged.  Had anticipated need to add a compression glove under layered compression wraps, but this is not currently needed if pt remains diligent with MLD to the fingers; pt agreed and verbalized understanding and will continue to focus on finger MLD when arm is wrapped.  Continue to  reinforce skin care and bandaging schedule, with pt verbalizing understanding.  Grandson present today and was able to verbalize bandaging technique with min vc.  Pt demonstrated good tolerance for AAROM at the L shoulder with hands clasped, and was encouraged to perform as noted above in tx note to carry these over at home for maximizing flexibility and promoting lymph flow.  Pt continues  to respond well to MLD and pt was pleased to observe the LUE quickly reducing in size in comparison to the RUE.  Fluid has moved nicely from her fingers and hand, now with only mild fluid built up still noted at the L forearm and upper arm.  Pt will continue to benefit from skilled OT to provide manual lymphatic drainage, therapeutic exercises/HEP progression, education on skin care and disease management, educ on possible use of vasopneumatic pump, layered compression bandaging to the LUE, and fitting for maintenance compression garment in order to reduce and later maintain LUE limb volume, promote healthy skin integrity, maximize ROM/strength, and increase functional use of the LUE for daily tasks.  Pt/family continue to be in agreement with poc.   OBJECTIVE IMPAIRMENTS: decreased activity tolerance, decreased endurance, decreased knowledge of condition, decreased knowledge of use of DME, decreased mobility, decreased ROM, decreased strength, hypomobility, increased edema, increased fascial restrictions, impaired flexibility, impaired sensation, impaired UE functional use, improper body mechanics, postural dysfunction, obesity, and pain.   ACTIVITY LIMITATIONS: carrying, lifting, bending, standing, squatting, sleeping, stairs, transfers, bed mobility, bathing, toileting, dressing, reach over head, hygiene/grooming, and locomotion level  PARTICIPATION LIMITATIONS: meal prep, cleaning, laundry, medication management, driving, shopping, and community activity  PERSONAL FACTORS: Age, Education, Fitness, Past/current  experiences, Time since onset of injury/illness/exacerbation, and 3+ comorbidities: stage 4 ca, L mastectomy, lymph node removal/radiation, anxiety, depression, cardiac hx (see chart), obesity  are also affecting patient's functional outcome.   REHAB POTENTIAL: Fair for goals  CLINICAL DECISION MAKING: Unstable/unpredictable  EVALUATION COMPLEXITY: High  GOALS: Goals reviewed with patient? Yes  SHORT TERM GOALS: Target date: 10/25/22  (6 weeks)  Pt will perform HEP with set up/min vc for promoting lymph flow and LUE flexibility for better engagement of the LUE with daily tasks. Baseline: Eval: HEP not yet initiated Goal status: INITIAL  2.  Pt will demo accurate technique to apply and wear layered compression bandages as instructed on the LUE 23 hours/day with 75% or better consistency in order to reduce LUE limb volume.   Baseline: Eval: Will plan to issue and begin bandaging/educ in the next 1-2 sessions.  Goal status: Initial  3. Pt/caregiver will be indep to verbalize 3-4 lymphedema precautions to prevent exacerbations and/or infections.  Baseline: Eval: educ not yet initiated.  Goal status: Initial   LONG TERM GOALS: Target date: 12/06/22 (12 weeks)  Pt will increase FOTO score to (TBD) or better to indicate improvement in self perceived functional use of the L arm with daily tasks. Baseline: Eval: FOTO to be completed next session (time constraints at eval) Goal status: INITIAL  2. Pt/caregiver will demo LUE MLD techniques with min vc or less and use of visual handout to promote fluid mobilization in the LUE.     Baseline: Eval: Educ not yet initiated  Goal status: Initial  3. Pt to measure 8-10% or more limb volume reduction in the LUE in order to promote healthy skin integrity, reduce infection risk, improve fit of clothing, and ease ability to engage the LUE into daily tasks.  Baseline: Eval: initial circumferential measures indicate LUE to be ~15% larger than RUE, but  will plan to measure actual volumetrics in upcoming tx session. Goal status: Initial  4. Pt to be fitted with LUE compression garment for long-term maintenance of fluid loss in the LUE.  Baseline: Eval: to be fit during last phase of CDT program  Goal status: Initial  PLAN:  PT FREQUENCY: 2x/week  PT DURATION: 12 weeks  PLANNED INTERVENTIONS: Therapeutic exercises, Therapeutic activity, Patient/Family education, Self Care, DME instructions, Manual lymph drainage, Compression bandaging, scar mobilization, Taping, Vasopneumatic device, and Manual therapy  PLAN FOR NEXT SESSION: see above  Danelle Earthly, MS, OTR/L, CLT  Otis Dials, OT 09/27/2022, 10:44 AM

## 2022-09-28 ENCOUNTER — Encounter: Payer: Self-pay | Admitting: Cardiology

## 2022-09-28 ENCOUNTER — Ambulatory Visit: Payer: Medicare Other | Attending: Cardiology | Admitting: Cardiology

## 2022-09-28 VITALS — BP 114/65 | HR 77 | Ht 66.0 in | Wt 194.6 lb

## 2022-09-28 DIAGNOSIS — R609 Edema, unspecified: Secondary | ICD-10-CM | POA: Insufficient documentation

## 2022-09-28 DIAGNOSIS — J189 Pneumonia, unspecified organism: Secondary | ICD-10-CM | POA: Diagnosis not present

## 2022-09-28 DIAGNOSIS — I10 Essential (primary) hypertension: Secondary | ICD-10-CM | POA: Insufficient documentation

## 2022-09-28 DIAGNOSIS — R0602 Shortness of breath: Secondary | ICD-10-CM | POA: Diagnosis present

## 2022-09-28 DIAGNOSIS — A419 Sepsis, unspecified organism: Secondary | ICD-10-CM | POA: Diagnosis not present

## 2022-09-28 DIAGNOSIS — I700 Atherosclerosis of aorta: Secondary | ICD-10-CM | POA: Diagnosis not present

## 2022-09-28 DIAGNOSIS — I351 Nonrheumatic aortic (valve) insufficiency: Secondary | ICD-10-CM | POA: Insufficient documentation

## 2022-09-28 DIAGNOSIS — N390 Urinary tract infection, site not specified: Secondary | ICD-10-CM | POA: Diagnosis not present

## 2022-09-28 DIAGNOSIS — R002 Palpitations: Secondary | ICD-10-CM | POA: Diagnosis not present

## 2022-09-28 DIAGNOSIS — C7951 Secondary malignant neoplasm of bone: Secondary | ICD-10-CM | POA: Diagnosis not present

## 2022-09-28 DIAGNOSIS — I471 Supraventricular tachycardia, unspecified: Secondary | ICD-10-CM | POA: Diagnosis not present

## 2022-09-28 DIAGNOSIS — C50919 Malignant neoplasm of unspecified site of unspecified female breast: Secondary | ICD-10-CM | POA: Insufficient documentation

## 2022-09-28 DIAGNOSIS — G4733 Obstructive sleep apnea (adult) (pediatric): Secondary | ICD-10-CM | POA: Diagnosis not present

## 2022-09-28 DIAGNOSIS — I719 Aortic aneurysm of unspecified site, without rupture: Secondary | ICD-10-CM | POA: Diagnosis not present

## 2022-09-28 DIAGNOSIS — R7989 Other specified abnormal findings of blood chemistry: Secondary | ICD-10-CM | POA: Insufficient documentation

## 2022-09-28 NOTE — Progress Notes (Signed)
Cardiology Office Note:  .   Date:  09/28/2022  ID:  Alicia [name], DOB 12-18-1950, MRN [id number] PCP: Corky Downs, MD  [facility] Providers Cardiologist:  Yvonne Kendall, MD    History of Present Illness: .   Alicia [name] is a 72 y.o. female with a past medical history of remote PE, left-sided breast cancer status postmastectomy and chemotherapy recently diagnosed with bony metastasis, aortic atherosclerosis, hypertension, anxiety with panic attack disorder, thyroid disease, who presents for follow-up.  Coronary CTA in 2017 showed no evidence of coronary artery disease with a calcium score of 0.  Echo at that time showed normal LV size and systolic function, G1 DD, mild left atrial enlargement, normal RV systolic function and ventricular cavity size, mild to moderate mitral and aortic regurgitation, mild to moderate pulmonic regurgitation with mild tricuspid regurgitation.  Repeat echocardiogram 12/03/2019 revealed an EF of 55 to 60%, no RWMA, mild to moderate mitral regurgitation, mild aortic valve insufficiency.  Heart monitor was notable for rare PACs and PVCs as well as brief episodes of PSVT, transient Mobitz type I second-degree AV block was observed.  She was referred to pulmonary for evaluation of sleep apnea.  She was last seen in clinic 2//22 where she was having sporadic palpitations that typically only last a few minutes at a time.  She had occasional left-sided chest pain that she describes as a stinging and burning in 1 of it was related to her mastectomy.  Sleep evaluation was ongoing to exclude underlying OSA as a contributing nocturnal heart block, she was scheduled for repeat coronary CTA at her convenience.  No medication changes that were made.  She presented to [facility] on 07/18/2022 with altered mental status.  Per family she had not taken any of her medicines for about a week prior to her admission.  She was in acute UTI, acute metabolic encephalopathy,  hypokalemia and hypophosphatemia.  She was treated with IV fluids and empiric antibiotics.  Electrolytes were repleted.  Hospital course was complicated by sepsis and pneumonia which necessitated reinitiation of IV antibiotics.  With IV vancomycin and cefepime followed by IV Unasyn from 07/24/2022 through 08/01/2022.  She was considered stable for discharge and was subsequently discharged to the facility on 08/01/2022.  She returns clinic today accompanied by her son. She was recently hospitalized, was discharged to [facility] for rehab, and has been home for approximately 3 weeks. She has been having shortness of breath on exertion, peripheral edema, and left upper extremity likely lymphedema. She was been doing daily weights, limiting her soidum intake and fluid intake with her daughter in law completing which an updated chart.  She previously was seen by the PA in her oncologist office who did a pro BNP that resulted at 1237.  While she was in [facility] she was started on furosemide for fluid, unfortunately furosemide was not pulling much fluid and was elevating her kidney function.  Upon returning home furosemide was stopped and she was started on hydrochlorothiazide. According to her weight chart today she started at 196 pounds and is down to 191 with a decent result.  Her left arm is wrapped with compression wrap for the lymphoedema.  With the results that are found in her blood work she was advised that she needed to follow back up with cardiology.  ROS: 10 point review of systems has been reviewed and considered negative with the exception of what is been listed in the HPI  Studies Reviewed: .  EKG Interpretation Date/Time:  Friday September 28 2022 14:37:36 EDT Ventricular Rate:  77 PR Interval:  180 QRS Duration:  84 QT Interval:  410 QTC Calculation: 463 R Axis:   24  Text Interpretation: Normal sinus rhythm Normal ECG When compared with ECG of 23-Jul-2022 21:19, No significant change was  found Confirmed by Charlsie Quest ([id number]) on 09/28/2022 2:48:17 PM    2D echo 12/03/2019: 1. Left ventricular ejection fraction, by estimation, is 55 to 60%. The  left ventricle has normal function. The left ventricle has no regional  wall motion abnormalities. Left ventricular diastolic parameters were  normal.   2. Right ventricular systolic function is normal. The right ventricular  size is normal. There is normal pulmonary artery systolic pressure.   3. The mitral valve is normal in structure. Mild to moderate mitral valve  regurgitation.   4. The aortic valve is tricuspid. Aortic valve regurgitation is mild.   5. The inferior vena cava is normal in size with greater than 50%  respiratory variability, suggesting right atrial pressure of 3 mmHg. __________   MUGA 07/2017: IMPRESSION: Normal LEFT ventricular ejection fraction of 61%. Normal LEFT ventricular wall motion. __________   Nuclear stress test 02/2015: Defect 1: There is a small defect of mild severity present in the basal anterior location. Findings consistent with ischemia. This is an intermediate risk study. The left ventricular ejection fraction is hyperdynamic (>65%).   Risk Assessment/Calculations:             Physical Exam:   VS:  BP 114/65 (BP Location: Right Arm, Patient Position: Sitting, Cuff Size: Normal)   Pulse 77   Ht 5\' 6"  (1.676 m)   Wt 194 lb 9.6 oz (88.3 kg)   BMI 31.41 kg/m    Wt Readings from Last 3 Encounters:  09/28/22 194 lb 9.6 oz (88.3 kg)  09/18/22 196 lb (88.9 kg)  09/12/22 200 lb 14.4 oz (91.1 kg)    GEN: Well nourished, well developed in no acute distress NECK: No JVD; No carotid bruits CARDIAC: RRR, no murmurs, rubs, gallops RESPIRATORY:  Clear to auscultation without rales, wheezing or rhonchi  ABDOMEN: Soft, non-tender, non-distended EXTREMITIES:  1+ edema BLE slightly greater on the left than the right, left upper extremity is wrapped with a compression wrap due to suspected  lymphedema; No deformity   ASSESSMENT AND PLAN: .   Shortness of breath with associated worsening peripheral edema with BNP of 1237.  Patient was previously on furosemide with not a reduction in weight or swelling and was placed back on previous hydrochlorothiazide 12.5 mg daily.  She has lost 5 pounds since being home from [facility].  Serum creatinine slightly elevated.  She has been continued on her HCTZ today versus changing back to furosemide.  Last echocardiogram revealed LVEF 55 to 60% with mild to moderate MR and moderate AR.  With symptoms of peripheral edema and shortness of breath and elevated BNP with CHF, undetermined if diastolic or systolic at this time.  She has been scheduled for an updated echocardiogram today.  Aortic atherosclerosis with hyperlipidemia which was incidentally noted on a CTA of the chest during ED visit in 10/2019.  Previous coronary CTA revealed a normal right dominant coronary arteries with a coronary calcium score of 0 in 02/2015.  She had been continued on aspirin 81 mg daily and rosuvastatin 5 mg daily.  Last lipid panel was in 2021.  Will need updated lipid panel on return.  Primary hypertension with a blood pressure  today of 114/65.  She is continued on HCTZ 12.5 mg daily and amlodipine 5 mg daily.  Encouraged to continue to monitor blood pressure 1 to 2 hours post medication administration at home.  Left upper extremity swelling negative duplex for DVT.  Recently was placed on antibiotics and seem to be ineffective.  Likely lymphedema.  She was referred to lymphedema clinic.  Palpitations, PSVT, meds type I secondary AV block, with no complaint of palpitations today.  Previous monitor showed Mobitz type I secondary AV block, sleep evaluation was encouraged to exclude underlying OSA as a contributor of nocturnal heart block.  She is currently on amlodipine but is no longer on beta-blocker therapy.  EKG today reveals sinus rhythm with a rate of 77.  Will continue to  monitor with surveillance EKGs  Breast cancer stage II of the left breast which she is continued to be followed by oncology.    Dispo: Patient to return to clinic to see MD/APP after echocardiogram is completed or sooner if needed for reevaluation of symptoms.  Signed, Djon Tith, NP

## 2022-09-28 NOTE — Patient Instructions (Signed)
Medication Instructions:  Your Physician recommend you continue on your current medication as directed.    *If you need a refill on your cardiac medications before your next appointment, please call your pharmacy*   Lab Work: None ordered.  If you have labs (blood work) drawn today and your tests are completely normal, you will receive your results only by: MyChart Message (if you have MyChart) OR A paper copy in the mail If you have any lab test that is abnormal or we need to change your treatment, we will call you to review the results.   Testing/Procedures: Your physician has requested that you have an echocardiogram. Echocardiography is a painless test that uses sound waves to create images of your heart. It provides your doctor with information about the size and shape of your heart and how well your heart's chambers and valves are working.   You may receive an ultrasound enhancing agent through an IV if needed to better visualize your heart during the echo. This procedure takes approximately one hour.  There are no restrictions for this procedure.  This will take place at [address] [facility], [place] [address]    Follow-Up: At [facility], you and your health needs are our priority.  As part of our continuing mission to provide you with exceptional heart care, we have created designated Provider Care Teams.  These Care Teams include your primary Cardiologist (physician) and Advanced Practice Providers (APPs -  Physician Assistants and Nurse Practitioners) who all work together to provide you with the care you need, when you need it.  We recommend signing up for the patient portal called "MyChart".  Sign up information is provided on this After Visit Summary.  MyChart is used to connect with patients for Virtual Visits (Telemedicine).  Patients are able to view lab/test results, encounter notes, upcoming appointments, etc.  Non-urgent messages can  be sent to your provider as well.   To learn more about what you can do with MyChart, go to [email or web address].    Your next appointment:   Follow up after ECHO.  Provider:   You may see Yvonne Kendall, MD or one of the following Advanced Practice Providers on your designated Care Team:   Nicolasa Ducking, NP Eula Listen, PA-C Cadence Fransico Michael, PA-C Charlsie Quest, NP

## 2022-10-02 ENCOUNTER — Ambulatory Visit

## 2022-10-02 DIAGNOSIS — M6281 Muscle weakness (generalized): Secondary | ICD-10-CM | POA: Diagnosis not present

## 2022-10-02 DIAGNOSIS — R278 Other lack of coordination: Secondary | ICD-10-CM | POA: Diagnosis not present

## 2022-10-02 DIAGNOSIS — I972 Postmastectomy lymphedema syndrome: Secondary | ICD-10-CM

## 2022-10-03 ENCOUNTER — Other Ambulatory Visit (HOSPITAL_COMMUNITY): Payer: Self-pay

## 2022-10-03 NOTE — Therapy (Signed)
OUTPATIENT OT UPPER EXTREMITY LYMPHEDEMA TREATMENT NOTE  Patient Name: Alicia [name] MRN: [id number] DOB:[birth year], 72 y.o., female Today's Date: 10/03/2022  END OF SESSION:   OT End of Session - 10/03/22 1032     Visit Number 5    Number of Visits 24    Date for OT Re-Evaluation 12/06/22    Progress Note Due on Visit 10    OT Start Time 1525    OT Stop Time 1621    OT Time Calculation (min) 56 min    Equipment Utilized During Treatment transport chair    Activity Tolerance Patient tolerated treatment well    Behavior During Therapy WFL for tasks assessed/performed            Past Medical History:  Diagnosis Date   Anxiety    Breast cancer, left (HCC) 07/2017   Depression    Hypertension    Hypothyroidism    Personal history of chemotherapy 2019   LEFT mastectomy-chemo before   Personal history of radiation therapy 03/2018   LEFT mastectomy   Rapid heart rate    Thyroid disease    Past Surgical History:  Procedure Laterality Date   AXILLARY LYMPH NODE BIOPSY Left 07/16/2017   METASTATIC MAMMARY CARCINOMA   BREAST BIOPSY Left 07/16/2017   [place] bx of left breast mass and left breast LN.  INVASIVE MAMMARY CARCINOMA, NO SPECIAL TYPE.    BREAST EXCISIONAL BIOPSY Right 2001   benign   BREAST LUMPECTOMY WITH SENTINEL LYMPH NODE BIOPSY Left 12/06/2017   Procedure: BREAST LUMPECTOMY WITH SENTINEL LYMPH NODE BX;  Surgeon: Earline Mayotte, MD;  Location: [facility];  Service: General;  Laterality: Left;   COLONOSCOPY     MASTECTOMY Left 12/23/2017   PORTACATH PLACEMENT Right 08/07/2017   Procedure: INSERTION PORT-A-CATH;  Surgeon: Earline Mayotte, MD;  Location: [facility];  Service: General;  Laterality: Right;   SIMPLE MASTECTOMY WITH AXILLARY SENTINEL NODE BIOPSY Left 12/23/2017   T2,N2 with 6/7 nodes positive. Whole breast radiation.  Surgeon: Earline Mayotte, MD;  Location: [facility];  Service: General;  Laterality: Left;   TONSILLECTOMY     Patient Active  Problem List   Diagnosis Date Noted   Pneumonia 07/24/2022   Palliative care encounter 07/24/2022   Malnutrition of moderate degree 07/20/2022   Acute UTI 07/20/2022   Pressure injury of skin 07/20/2022   Hypokalemia 07/19/2022   Hypophosphatemia 07/19/2022   Acute metabolic encephalopathy 07/19/2022   Generalized weakness 07/18/2022   AKI (acute kidney injury) (HCC) 07/18/2022   Electrolyte abnormality 07/18/2022   QT prolongation 07/18/2022   Diarrhea 07/18/2022   Altered mental status 07/18/2022   Contact dermatitis 05/10/2021   Hematuria 03/31/2021   Urinary tract infection without hematuria 03/01/2021   PSVT (paroxysmal supraventricular tachycardia) 02/20/2020   Mobitz type 1 second degree AV block 02/20/2020   Pure hypercholesterolemia 02/20/2020   Daytime somnolence 02/18/2020   Chest pain 11/13/2019   Palpitations 11/13/2019   Obesity (BMI 35.0-39.9 without comorbidity) 11/08/2019   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 Corky Downs  REFERRING PROVIDER: Clent Jacks, PA-C  REFERRING DIAG:  Diagnosis  R60.0 (ICD-10-CM) -  Edema of left upper extremity  C50.912,C79.51 (ICD-10-CM) - Carcinoma of left breast metastatic to bone (HCC)  I89.0 (ICD-10-CM) - Lymphedema   THERAPY DIAG:  Muscle weakness (generalized)  Postmastectomy lymphedema syndrome  Other lack of coordination  ONSET DATE: 07/2022  Rationale for Evaluation and Treatment: Rehabilitation  SUBJECTIVE                                                                                                                                                                                           SUBJECTIVE STATEMENT: Pt reports her hand and fingers are a little more swollen today but she wonders if her bandages were not where they needed to be on her hand.   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  LUE lymphedema, BLE edema.  Per chart from [name] on 08/15/22: ONCOLOGY HISTORY: Patient initially received neoadjuvant chemotherapy with Adriamycin and Cytoxan followed by weekly Taxol. She only received 4 cycles of weekly Taxol prior to discontinuation of treatment on October 31, 2017 secondary to persistent peripheral neuropathy. She ultimately required mastectomy and final pathology noted 5 of 6 lymph nodes positive for disease. Patient completed adjuvant XRT in mid April 2020. Pain initiated letrozole in May 2020, this was discontinued secondary to side effects and patient was started on anastrozole. Nuclear medicine bone scan on June 19, 2019 revealed metastatic disease and anastrozole was subsequently discontinued. PET scan results from October 02, 2021 revealed no clear evidence of metastatic progression. PET scan results from June 30, 2019 reviewed independently with metastatic bony disease, but no obvious evidence of visceral disease. MRI of the brain on August 24, 2019 reviewed independently with no obvious evidence of metastatic disease.   Pt reports that she was hospitalized in July for a UTI which resulted in sepsis.  Pt was discharged from the hospital and sent to rehab at [facility] where she states her lymphedema worsened there at the end of July.  Pt reports she has been given 2 different antibiotics, and she has 2 days left of Doxycycline, though she reports she has a refill.  OT encouraged pt/family follow up with MD to see if they would like her to refill the medication, as pt does still have mild heat radiating from the forearm and  pinkish tint to this area.  Pt has also had recent BP med changes, stating that she was on hydrochlorothiazide, got switched to lasix, and now is back on hydrochlorothiazide.  Pt returns for cardiology follow up Sept 13th and she reports the swelling in her BLEs is new.   Per  chart from visit with Clent Jacks, PA on 09/12/22:   Patient is here to follow up on left arm swelling and pain. She has had lymphedema for quite some time secondary to her breast surgeries/lymph node removal. After being in the hospital for altered mental status/dehydration/AKI and rehabilitation, the arm swelling and pain worsened. At her last visit on 09/05/2022 a doppler was performed which showed no evidence of DVT of arm. She was started on a 10 day course of doxycyline.    Today she reports that the swelling is about the same, if not worse. She has also developed gradually worsening peripheral edema since her rehabilitation facility switched her off of hydrochlorothiazide 12.5 mg onto lasix (unknown dose). She denies SOB but is having trouble ambulating due to the swelling and discomfort. She is unsure about weight changes. She has been fluid restricting.   PAIN: 10/02/22: Pt denies any LUE pain this date Are you having pain? Yes NPRS scale: 1-3/10 Pain location: LUE Pain orientation: Left  PAIN TYPE: aching, tight, and tingling, heavy Pain description: constant  Aggravating factors: reaching up Relieving factors: rest, elevating the arm  PRECAUTIONS: None  WEIGHT BEARING RESTRICTIONS: No  FALLS:  Has patient fallen in last 6 months? No  LIVING ENVIRONMENT: Lives with: lives with their family, lives with Lowella Bandy, daughter (who works from home), and son, [name] Lives in: 2 level home Stairs: Yes: Internal: 1 flight steps; on right going up and bilateral but cannot reach both and External: 1 steps; none Has following equipment at home: Dan Humphreys - 2 wheeled and [employer] - 4 wheeled (uses 2 wheeled downstairs and 4  wheeled upstairs on carpet)  OCCUPATION: retired  LEISURE: watch tv, read  PRIOR LEVEL OF FUNCTION: Independent and living alone until her d/c from [facility] rehab end of July 2024, at which point pt moved in with daughter and son d/t requiring increased level of care.  PATIENT GOALS: "Reduce the swelling in my L arm and my legs and build up my strength."  OBJECTIVE  COGNITION:  Overall cognitive status: Within functional limits for tasks assessed ; 0x4, but does appear slightly forgetful  PALPATION: LUE skin is soft in the upper arm, more taut in the forearm and hand, noting at least 2+ pitting edema in the L forearm, hand, and BLEs.  L forearm is slightly warm to the touch and has a pinkish tint as compared to R unaffected side.  OBSERVATIONS: Pt and daughter pleasant, cooperative, and receptive to initial discussion of care plan.   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  LUE does appear quite heavy, with pt using R hand to cradle the arm at times.   HAND DOMINANCE: Right  UPPER EXTREMITY AROM:  AROM RIGHT   eval   Shoulder extension   Shoulder flexion WFL  Shoulder abduction [facility]  Shoulder internal rotation WNL  Shoulder external rotation WFL    (Blank rows = not tested)  AROM LEFT   eval  Shoulder extension   Shoulder flexion 111*  Shoulder abduction 100*  Shoulder internal rotation WNL  Shoulder external rotation 85* with arm abd at 90*    (Blank rows = not tested)   CERVICAL AROM: NT at eval     Flexion  Extension   Right lateral flexion   Left lateral flexion   Right rotation   Left rotation     UPPER EXTREMITY STRENGTH:  R shoulder flex/abd: 4-; L  shoulder flex 3+ (within available range), L shoulder abd 4- (within available range) R/L elbow flex/ext: 4+;  R/L wrist flex/ext 4+  LOWER EXTREMITY AROM/PROM: NT  LYMPHEDEMA ASSESSMENTS: see circumferential measurements noted below; additional testing to be completed in upcoming sessions as needed  SURGERY TYPE/DATE: L mastectomy 12/23/2017  NUMBER OF LYMPH NODES REMOVED: 6-7 L axillary lymph nodes removed  CHEMOTHERAPY: normally takes a chemo pill daily but currently paused until pt can get her strength back.    RADIATION: 3 rounds of radiation, with last tx being May 31st 2024; radiation targeting L hip and pelvis.  Pt reports 16 total treatments in the pelvic area.  HORMONE TREATMENT: had been taking a hormone replacement pill but recently stopped in the hospital; may restart it, per pt/daughter  INFECTIONS: question for LUE cellulitis and subsequently given ABX x2  -Pt reports she did have a sacral pu from recent hospitalization but cleared up at [facility], and denied infection.  LYMPHEDEMA ASSESSMENTS: circumferential measurements in cm   LANDMARK RIGHT eval  10 cm proximal to olecranon process 31.3 cm  Olecranon process 27.0 cm  10 cm proximal to ulnar styloid process 20.2 cm  Just proximal to ulnar styloid process 15.9 cm  Across hand at thumb web space 20.6 cm  At base of 3rd digit 6.5 cm    LANDMARK LEFT  eval  10 cm proximal to olecranon process 38.0 cm  Olecranon process 31.8 cm  10 cm proximal to ulnar styloid process 26.5 cm  Just proximal to ulnar styloid process 19.8 cm  Across hand at thumb web space 21.5 cm  At base of 3rd digit 6.6 cm    FUNCTIONAL TESTS: none  GAIT:  Distance walked: NT Assistive device utilized:  transport chair, RW Level of assistance: Modified independence short distances inside the home on level ground with RW  PATIENT SURVEYS:  FOTO To complete next session 09/18/22: 56; predicted 67  TODAY'S TREATMENT:                                                                                                                                          DATE: 10/02/22 Self Care: -Doffed RUE bandages and completed skin care.  Arm washed, lotion applied.  Applied clean set of bandages end of session.  Continued review of optimal skin care to prevent infection, avoiding cuts/scrapes/bug bites/dry/cracked skin.  Reviewed bandaging technique with pt and provided written step-by-step handout for bandaging sequence and technique.  Reviewed handout with pt, which also included indications for removing bandages, including when bandages become loose, if LUE is itchy or needs washing, or if bandages become wet or soiled.    -Assessed fit of pt's 20-30 mmHg compression sleeve  with silicone band.  Total assist to don.  Sleeve is snug, but does appear to be appropriate size.  Additional MLD and time using compression bandages will be beneficial before pt transitions to wearing compression sleeve routinely, which will ease donning ability.  Advised pt that she will need a compression glove to maintain size of hand.  Will assist with measuring when appropriate, but additional fluid loss is needed before this occurs.    -OT Re-applied layers as follows, extending from distal L hand at the base of MCPs to upper arm, ending at axillary crease:  - 3" stockinette  - small foam piece and chip bag at elbow crease used today to improved skin integrity where pt had skin irritation, likely from bandage creases or wrinkle in stockinette.  Advised pt to continue to monitor skin and utilize padding as noted above when needed.   - 1 Rosidal 10 cm x 2.5 m x 0.4 cm soft foam roll extending from base of MCPs to upper arm  - 1 Rosidal K 6 cm x 5 m bandage anchored at the L hand MCPs, ending at the proximal forearm   - 1 Rosidal K 8 cm x 5 m bandage anchored at the L wrist, ending at the upper arm  -1 Rosidal K 10 cm x 5 m bandage anchored at the L mid forearm, ending at the upper  arm near axillary crease.  - Secured with tape and tetra-net up the arm  Manual Therapy: Performed MLD technique throughout the LUE, routing fluid from LUE across chest to functioning pathways at the R axillary nodes, working proximal-distal-proximal.  Concentrated MLD at the L upper arm, medial elbow and elbow crease, and L hand and digits where fluid build up was more pronounced today. Reviewed self MLD techniques; mod vc.  PATIENT EDUCATION:  Education details: Disease management; layed compression bandage application technique and wearing schedule; self MLD technique; use of compression sleeve and glove once limb volume is further reduced. Person educated: Patient Education method: Explanation, demo Education comprehension: verbalized understanding and needs further education  HOME EXERCISE PROGRAM: LUE A/AAROM, diaphragmatic breathing, self MLD  ASSESSMENT:  CLINICAL IMPRESSION: Pt reports her hand and fingers are a little more swollen today but she wonders if her bandages were not where they needed to be on her hand.  L hand is more swollen today, but responded well to MLD and advised pt that hand may swell as bandages loosen.  Daughter re-bandaged pt over the weekend and bandaging technique was accurate.  OT issued written handout for bandaging technique to have at home for all caregivers.  Assessed fit of pt's 20-30 mmHg compression sleeve with silicone band.  Total assist to don.  Sleeve is snug, but does appear to be appropriate size.  Additional MLD and time using compression bandages will be beneficial before pt transitions to wearing compression sleeve routinely, which will ease donning ability.  Advised pt that she will need a compression glove to maintain size of hand.  Will assist with measuring when appropriate, but additional fluid loss is needed before this occurs.  Pt continues to demo excellent adherence to bandaging schedule.  Skin irritation present today at L volar and  medial aspect of elbow, likely from bandage crease or wrinkle in stockinette.  Used foam and chip bag to pad this area today when re-bandaging, and advised pt to continue to monitor.  Pt reports that she's been working on her A/AAROM exercises for the LUE at home, though she  states she may not have done them as much as she could have over the weekend.  Pt will continue to benefit from skilled OT to provide manual lymphatic drainage, therapeutic exercises/HEP progression, education on skin care and disease management, educ on possible use of vasopneumatic pump, layered compression bandaging to the LUE, and fitting for maintenance compression glove in order to reduce and later maintain LUE limb volume, promote healthy skin integrity, maximize ROM/strength, and increase functional use of the LUE for daily tasks.  Pt/family continue to be in agreement with poc.   OBJECTIVE IMPAIRMENTS: decreased activity tolerance, decreased endurance, decreased knowledge of condition, decreased knowledge of use of DME, decreased mobility, decreased ROM, decreased strength, hypomobility, increased edema, increased fascial restrictions, impaired flexibility, impaired sensation, impaired UE functional use, improper body mechanics, postural dysfunction, obesity, and pain.   ACTIVITY LIMITATIONS: carrying, lifting, bending, standing, squatting, sleeping, stairs, transfers, bed mobility, bathing, toileting, dressing, reach over head, hygiene/grooming, and locomotion level  PARTICIPATION LIMITATIONS: meal prep, cleaning, laundry, medication management, driving, shopping, and community activity  PERSONAL FACTORS: Age, Education, Fitness, Past/current experiences, Time since onset of injury/illness/exacerbation, and 3+ comorbidities: stage 4 ca, L mastectomy, lymph node removal/radiation, anxiety, depression, cardiac hx (see chart), obesity  are also affecting patient's functional outcome.   REHAB POTENTIAL: Fair for goals  CLINICAL  DECISION MAKING: Unstable/unpredictable  EVALUATION COMPLEXITY: High  GOALS: Goals reviewed with patient? Yes  SHORT TERM GOALS: Target date: 10/25/22  (6 weeks)  Pt will perform HEP with set up/min vc for promoting lymph flow and LUE flexibility for better engagement of the LUE with daily tasks. Baseline: Eval: HEP not yet initiated Goal status: INITIAL  2.  Pt will demo accurate technique to apply and wear layered compression bandages as instructed on the LUE 23 hours/day with 75% or better consistency in order to reduce LUE limb volume.   Baseline: Eval: Will plan to issue and begin bandaging/educ in the next 1-2 sessions.  Goal status: Initial  3. Pt/caregiver will be indep to verbalize 3-4 lymphedema precautions to prevent exacerbations and/or infections.  Baseline: Eval: educ not yet initiated.  Goal status: Initial   LONG TERM GOALS: Target date: 12/06/22 (12 weeks)  Pt will increase FOTO score to (TBD) or better to indicate improvement in self perceived functional use of the L arm with daily tasks. Baseline: Eval: FOTO to be completed next session (time constraints at eval) Goal status: INITIAL  2. Pt/caregiver will demo LUE MLD techniques with min vc or less and use of visual handout to promote fluid mobilization in the LUE.     Baseline: Eval: Educ not yet initiated  Goal status: Initial  3. Pt to measure 8-10% or more limb volume reduction in the LUE in order to promote healthy skin integrity, reduce infection risk, improve fit of clothing, and ease ability to engage the LUE into daily tasks.  Baseline: Eval: initial circumferential measures indicate LUE to be ~15% larger than RUE, but will plan to measure actual volumetrics in upcoming tx session. Goal status: Initial  4. Pt to be fitted with LUE compression garment for long-term maintenance of fluid loss in the LUE.  Baseline: Eval: to be fit during last phase of CDT program  Goal status: Initial  PLAN:  PT  FREQUENCY: 2x/week  PT DURATION: 12 weeks  PLANNED INTERVENTIONS: Therapeutic exercises, Therapeutic activity, Patient/Family education, Self Care, DME instructions, Manual lymph drainage, Compression bandaging, scar mobilization, Taping, Vasopneumatic device, and Manual therapy  PLAN FOR NEXT SESSION: see  above  Danelle Earthly, MS, OTR/L, CLT  Otis Dials, OT 10/03/2022, 10:38 AM

## 2022-10-04 ENCOUNTER — Ambulatory Visit

## 2022-10-04 ENCOUNTER — Other Ambulatory Visit (HOSPITAL_COMMUNITY): Payer: Self-pay

## 2022-10-04 ENCOUNTER — Other Ambulatory Visit: Payer: Self-pay | Admitting: Pharmacist

## 2022-10-04 DIAGNOSIS — R278 Other lack of coordination: Secondary | ICD-10-CM | POA: Diagnosis not present

## 2022-10-04 DIAGNOSIS — J189 Pneumonia, unspecified organism: Secondary | ICD-10-CM | POA: Diagnosis not present

## 2022-10-04 DIAGNOSIS — G4733 Obstructive sleep apnea (adult) (pediatric): Secondary | ICD-10-CM | POA: Diagnosis not present

## 2022-10-04 DIAGNOSIS — M6281 Muscle weakness (generalized): Secondary | ICD-10-CM

## 2022-10-04 DIAGNOSIS — N390 Urinary tract infection, site not specified: Secondary | ICD-10-CM | POA: Diagnosis not present

## 2022-10-04 DIAGNOSIS — I719 Aortic aneurysm of unspecified site, without rupture: Secondary | ICD-10-CM | POA: Diagnosis not present

## 2022-10-04 DIAGNOSIS — A419 Sepsis, unspecified organism: Secondary | ICD-10-CM | POA: Diagnosis not present

## 2022-10-04 DIAGNOSIS — I972 Postmastectomy lymphedema syndrome: Secondary | ICD-10-CM

## 2022-10-04 DIAGNOSIS — I10 Essential (primary) hypertension: Secondary | ICD-10-CM | POA: Diagnosis not present

## 2022-10-04 DIAGNOSIS — C50912 Malignant neoplasm of unspecified site of left female breast: Secondary | ICD-10-CM

## 2022-10-05 ENCOUNTER — Telehealth: Payer: Self-pay | Admitting: *Deleted

## 2022-10-05 ENCOUNTER — Other Ambulatory Visit (HOSPITAL_COMMUNITY): Payer: Self-pay

## 2022-10-05 ENCOUNTER — Other Ambulatory Visit: Payer: Self-pay

## 2022-10-05 DIAGNOSIS — C50912 Malignant neoplasm of unspecified site of left female breast: Secondary | ICD-10-CM

## 2022-10-05 MED ORDER — EVEROLIMUS 5 MG PO TABS
5.0000 mg | ORAL_TABLET | Freq: Every day | ORAL | 0 refills | Status: DC
  Filled 2022-10-05: qty 30, 30d supply, fill #0

## 2022-10-05 NOTE — Telephone Encounter (Signed)
Left voice mail for [name] that she is to hold her treatment and that he should get a call from our office to set up a [facility] visit Monday and that if she gets worse over the weekend she should go to the ED

## 2022-10-05 NOTE — Addendum Note (Signed)
Addended by: Remi Haggard on: 10/05/2022 03:38 PM   Modules accepted: Orders

## 2022-10-05 NOTE — Telephone Encounter (Signed)
Son called reporting that since restarting her oral treatment, she has done a 180 in how she feels, She is not eating, gets winded easily and is feeling weak. He is asking what needs to be done. Please advise

## 2022-10-06 NOTE — Therapy (Signed)
OUTPATIENT OT UPPER EXTREMITY LYMPHEDEMA TREATMENT NOTE  Patient Name: Alicia [name] MRN: [id number] DOB:07-Jan-1951, 72 y.o., female Today's Date: 10/06/2022  END OF SESSION:   OT End of Session - 10/06/22 2206     Visit Number 6    Number of Visits 24    Date for OT Re-Evaluation 12/06/22    Progress Note Due on Visit 10    OT Start Time 1445    OT Stop Time 1530    OT Time Calculation (min) 45 min    Equipment Utilized During Treatment transport chair    Activity Tolerance Patient tolerated treatment well    Behavior During Therapy WFL for tasks assessed/performed            Past Medical History:  Diagnosis Date   Anxiety    Breast cancer, left (HCC) 07/2017   Depression    Hypertension    Hypothyroidism    Personal history of chemotherapy 2019   LEFT mastectomy-chemo before   Personal history of radiation therapy 03/2018   LEFT mastectomy   Rapid heart rate    Thyroid disease    Past Surgical History:  Procedure Laterality Date   AXILLARY LYMPH NODE BIOPSY Left 07/16/2017   METASTATIC MAMMARY CARCINOMA   BREAST BIOPSY Left 07/16/2017   [place] bx of left breast mass and left breast LN.  INVASIVE MAMMARY CARCINOMA, NO SPECIAL TYPE.    BREAST EXCISIONAL BIOPSY Right 2001   benign   BREAST LUMPECTOMY WITH SENTINEL LYMPH NODE BIOPSY Left 12/06/2017   Procedure: BREAST LUMPECTOMY WITH SENTINEL LYMPH NODE BX;  Surgeon: Earline Mayotte, MD;  Location: [facility];  Service: General;  Laterality: Left;   COLONOSCOPY     MASTECTOMY Left 12/23/2017   PORTACATH PLACEMENT Right 08/07/2017   Procedure: INSERTION PORT-A-CATH;  Surgeon: Earline Mayotte, MD;  Location: [facility];  Service: General;  Laterality: Right;   SIMPLE MASTECTOMY WITH AXILLARY SENTINEL NODE BIOPSY Left 12/23/2017   T2,N2 with 6/7 nodes positive. Whole breast radiation.  Surgeon: Earline Mayotte, MD;  Location: [facility];  Service: General;  Laterality: Left;   TONSILLECTOMY     Patient Active  Problem List   Diagnosis Date Noted   Pneumonia 07/24/2022   Palliative care encounter 07/24/2022   Malnutrition of moderate degree 07/20/2022   Acute UTI 07/20/2022   Pressure injury of skin 07/20/2022   Hypokalemia 07/19/2022   Hypophosphatemia 07/19/2022   Acute metabolic encephalopathy 07/19/2022   Generalized weakness 07/18/2022   AKI (acute kidney injury) (HCC) 07/18/2022   Electrolyte abnormality 07/18/2022   QT prolongation 07/18/2022   Diarrhea 07/18/2022   Altered mental status 07/18/2022   Contact dermatitis 05/10/2021   Hematuria 03/31/2021   Urinary tract infection without hematuria 03/01/2021   PSVT (paroxysmal supraventricular tachycardia) 02/20/2020   Mobitz type 1 second degree AV block 02/20/2020   Pure hypercholesterolemia 02/20/2020   Daytime somnolence 02/18/2020   Chest pain 11/13/2019   Palpitations 11/13/2019   Obesity (BMI 35.0-39.9 without comorbidity) 11/08/2019   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 Corky Downs  REFERRING PROVIDER: Clent Jacks, PA-C  REFERRING DIAG:  Diagnosis  R60.0 (ICD-10-CM) -  Edema of left upper extremity  C50.912,C79.51 (ICD-10-CM) - Carcinoma of left breast metastatic to bone (HCC)  I89.0 (ICD-10-CM) - Lymphedema   THERAPY DIAG:  Muscle weakness (generalized)  Postmastectomy lymphedema syndrome  Other lack of coordination  ONSET DATE: 07/2022  Rationale for Evaluation and Treatment: Rehabilitation  SUBJECTIVE                                                                                                                                                                                           SUBJECTIVE STATEMENT:  Pt reports her hand is feeling better, noting less swelling in the fingers this date.  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  LUE lymphedema, BLE edema.  Per chart from [name] on 08/15/22: ONCOLOGY HISTORY: Patient initially received neoadjuvant chemotherapy with Adriamycin and Cytoxan followed by weekly Taxol. She only received 4 cycles of weekly Taxol prior to discontinuation of treatment on October 31, 2017 secondary to persistent peripheral neuropathy. She ultimately required mastectomy and final pathology noted 5 of 6 lymph nodes positive for disease. Patient completed adjuvant XRT in mid April 2020. Pain initiated letrozole in May 2020, this was discontinued secondary to side effects and patient was started on anastrozole. Nuclear medicine bone scan on June 19, 2019 revealed metastatic disease and anastrozole was subsequently discontinued. PET scan results from October 02, 2021 revealed no clear evidence of metastatic progression. PET scan results from June 30, 2019 reviewed independently with metastatic bony disease, but no obvious evidence of visceral disease. MRI of the brain on August 24, 2019 reviewed independently with no obvious evidence of metastatic disease.   Pt reports that she was hospitalized in July for a UTI which resulted in sepsis.  Pt was discharged from the hospital and sent to rehab at [facility] where she states her lymphedema worsened there at the end of July.  Pt reports she has been given 2 different antibiotics, and she has 2 days left of Doxycycline, though she reports she has a refill.  OT encouraged pt/family follow up with MD to see if they would like her to refill the medication, as pt does still have mild heat radiating from the forearm and pinkish tint to this area.  Pt has also had recent  BP med changes, stating that she was on hydrochlorothiazide, got switched to lasix, and now is back on hydrochlorothiazide.  Pt returns for cardiology follow up Sept 13th and she reports the swelling in her BLEs is new.   Per chart from visit with Clent Jacks, PA on 09/12/22:   Patient is  here to follow up on left arm swelling and pain. She has had lymphedema for quite some time secondary to her breast surgeries/lymph node removal. After being in the hospital for altered mental status/dehydration/AKI and rehabilitation, the arm swelling and pain worsened. At her last visit on 09/05/2022 a doppler was performed which showed no evidence of DVT of arm. She was started on a 10 day course of doxycyline.    Today she reports that the swelling is about the same, if not worse. She has also developed gradually worsening peripheral edema since her rehabilitation facility switched her off of hydrochlorothiazide 12.5 mg onto lasix (unknown dose). She denies SOB but is having trouble ambulating due to the swelling and discomfort. She is unsure about weight changes. She has been fluid restricting.   PAIN: 10/04/22: Pt denies any LUE pain this date Are you having pain? Yes NPRS scale: 1-3/10 Pain location: LUE Pain orientation: Left  PAIN TYPE: aching, tight, and tingling, heavy Pain description: constant  Aggravating factors: reaching up Relieving factors: rest, elevating the arm  PRECAUTIONS: None  WEIGHT BEARING RESTRICTIONS: No  FALLS:  Has patient fallen in last 6 months? No  LIVING ENVIRONMENT: Lives with: lives with their family, lives with Alicia [name], daughter (who works from home), and son, Alicia [name] Lives in: 2 level home Stairs: Yes: Internal: 1 flight steps; on right going up and bilateral but cannot reach both and External: 1 steps; none Has following equipment at home: Dan Humphreys - 2 wheeled and [employer] - 4 wheeled (uses 2 wheeled downstairs and 4 wheeled upstairs on carpet)  OCCUPATION:  retired  LEISURE: watch tv, read  PRIOR LEVEL OF FUNCTION: Independent and living alone until her d/c from [facility] rehab end of July 2024, at which point pt moved in with daughter and son d/t requiring increased level of care.  PATIENT GOALS: "Reduce the swelling in my L arm and my legs and build up my strength."  OBJECTIVE  COGNITION:  Overall cognitive status: Within functional limits for tasks assessed ; 0x4, but does appear slightly forgetful  PALPATION: LUE skin is soft in the upper arm, more taut in the forearm and hand, noting at least 2+ pitting edema in the L forearm, hand, and BLEs.  L forearm is slightly warm to the touch and has a pinkish tint as compared to R unaffected side.  OBSERVATIONS: Pt and daughter pleasant, cooperative, and receptive to initial discussion of care plan.   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  LUE does appear quite heavy, with pt using R hand to cradle the arm at times.   HAND DOMINANCE: Right  UPPER EXTREMITY AROM:  AROM RIGHT   eval   Shoulder extension   Shoulder flexion WFL  Shoulder abduction [facility]  Shoulder internal rotation WNL  Shoulder external rotation WFL    (Blank rows = not tested)  AROM LEFT   eval  Shoulder extension   Shoulder flexion 111*  Shoulder abduction 100*  Shoulder internal rotation WNL  Shoulder external rotation 85* with arm abd at 90*    (Blank rows = not tested)   CERVICAL AROM: NT at eval     Flexion   Extension   Right lateral flexion   Left lateral flexion  Right rotation   Left rotation     UPPER EXTREMITY STRENGTH:  R shoulder flex/abd: 4-; L shoulder flex 3+ (within available range), L  shoulder abd 4- (within available range) R/L elbow flex/ext: 4+;  R/L wrist flex/ext 4+  LOWER EXTREMITY AROM/PROM: NT  LYMPHEDEMA ASSESSMENTS: see circumferential measurements noted below; additional testing to be completed in upcoming sessions as needed  SURGERY TYPE/DATE: L mastectomy 12/23/2017  NUMBER OF LYMPH NODES REMOVED: 6-7 L axillary lymph nodes removed  CHEMOTHERAPY: normally takes a chemo pill daily but currently paused until pt can get her strength back.    RADIATION: 3 rounds of radiation, with last tx being May 31st 2024; radiation targeting L hip and pelvis.  Pt reports 16 total treatments in the pelvic area.  HORMONE TREATMENT: had been taking a hormone replacement pill but recently stopped in the hospital; may restart it, per pt/daughter  INFECTIONS: question for LUE cellulitis and subsequently given ABX x2  -Pt reports she did have a sacral pu from recent hospitalization but cleared up at [facility], and denied infection.  LYMPHEDEMA ASSESSMENTS: circumferential measurements in cm   LANDMARK RIGHT eval  10 cm proximal to olecranon process 31.3 cm  Olecranon process 27.0 cm  10 cm proximal to ulnar styloid process 20.2 cm  Just proximal to ulnar styloid process 15.9 cm  Across hand at thumb web space 20.6 cm  At base of 3rd digit 6.5 cm    LANDMARK LEFT  eval  10 cm proximal to olecranon process 38.0 cm  Olecranon process 31.8 cm  10 cm proximal to ulnar styloid process 26.5 cm  Just proximal to ulnar styloid process 19.8 cm  Across hand at thumb web space 21.5 cm  At base of 3rd digit 6.6 cm    FUNCTIONAL TESTS: none  GAIT:  Distance walked: NT Assistive device utilized:  transport chair, RW Level of assistance: Modified independence short distances inside the home on level ground with RW  PATIENT SURVEYS:  FOTO To complete next session 09/18/22: 56; predicted 67  TODAY'S TREATMENT:                                                                                                                                          DATE: 10/04/22 Self Care: -Doffed RUE bandages and completed skin care.  Arm washed, lotion applied.  Applied clean set of bandages end of session.  Continued review of optimal skin care to prevent infection, avoiding cuts/scrapes/bug bites/dry/cracked skin.  Reviewed bandaging technique with review of written step-by-step handout for bandaging sequence and technique which was issued last session.    -Advised pt/grandson to call and schedule appointment to be fitted for L hand compression glove, open fingered.  Advised pt to keep bandages donned until arriving at that appointment so that hand is at it's smallest at the time of measurement.  Pt verbalized understanding.   -OT  Re-applied layers as follows, extending from distal L hand at the base of MCPs to upper arm, ending at axillary crease:  - 3" stockinette  - small foam piece and chip bag at elbow crease used today for improved skin integrity where pt has skin irritation, likely from bandage creases or wrinkle in stockinette.  Advised pt to continue to monitor skin and utilize padding as noted above when needed.   - 1 Rosidal 10 cm x 2.5 m x 0.4 cm soft foam roll extending from base of MCPs to upper arm  - 1 Rosidal K 6 cm x 5 m bandage anchored at the L hand MCPs, ending at the proximal forearm   - 1 Rosidal K 8 cm x 5 m bandage anchored at the L wrist, ending at the upper arm  -1 Rosidal K 10 cm x 5 m bandage anchored at the L mid forearm, ending at the upper arm near axillary crease.  - Secured with tape and tetra-net up the arm  Manual Therapy: Performed MLD technique throughout the LUE, routing fluid from LUE across chest to functioning pathways at the R axillary nodes, working proximal-distal-proximal.  Concentrated MLD at the L upper arm, medial elbow and elbow crease, and L forearm where fluid build up was more pronounced today, though steadily improving. Reviewed self MLD  techniques; mod vc.  Issued written step by step handout of MLD sequence for pt carry over at home.   PATIENT EDUCATION:  Education details: Disease management; layed compression bandage application technique and wearing schedule; self MLD technique; use of compression sleeve and glove once limb volume is further reduced. Person educated: Patient Education method: Explanation, demo Education comprehension: verbalized understanding and needs further education  HOME EXERCISE PROGRAM: LUE A/AAROM, diaphragmatic breathing, self MLD  ASSESSMENT:  CLINICAL IMPRESSION: L hand well drained today.  Pt reported that she can tell the hand responds best when bandages reach all the way to the base of her digits.  Pt also continues to focus on self MLD at the fingers when arm is bandaged at home.  Pt continues to demo excellent adherence to bandaging schedule.  Pt is showing steady improvement in LUE volume reduction, and is nearing readiness to transition from bandages to compression sleeve and glove.  OT encouraged pt/grandson call to schedule an appointment for pt to be measured for open fingered compression glove next week.  Lucila Maine will help to remind pt/family.  Continue to monitor irritated skin at the L elbow crease.  Padded this area again with foam and chip bag under layered bandages and foam roll.  Pt now has written instructions for bandaging and MLD sequence for carry over at home.  Family members continue to be supportive in bandaging, while pt is working to increase indep with self MLD.  Currently min-mod vc for accuracy of self MLD and family demos good bandaging technique.  Pt will continue to benefit from skilled OT to provide manual lymphatic drainage, therapeutic exercises/HEP progression, education on skin care and disease management, educ on possible use of vasopneumatic pump, layered compression bandaging to the LUE, and fitting for maintenance compression glove in order to reduce and later  maintain LUE limb volume, promote healthy skin integrity, maximize ROM/strength, and increase functional use of the LUE for daily tasks.  Pt/family continue to be in agreement with poc.   OBJECTIVE IMPAIRMENTS: decreased activity tolerance, decreased endurance, decreased knowledge of condition, decreased knowledge of use of DME, decreased mobility, decreased ROM, decreased strength, hypomobility, increased  edema, increased fascial restrictions, impaired flexibility, impaired sensation, impaired UE functional use, improper body mechanics, postural dysfunction, obesity, and pain.   ACTIVITY LIMITATIONS: carrying, lifting, bending, standing, squatting, sleeping, stairs, transfers, bed mobility, bathing, toileting, dressing, reach over head, hygiene/grooming, and locomotion level  PARTICIPATION LIMITATIONS: meal prep, cleaning, laundry, medication management, driving, shopping, and community activity  PERSONAL FACTORS: Age, Education, Fitness, Past/current experiences, Time since onset of injury/illness/exacerbation, and 3+ comorbidities: stage 4 ca, L mastectomy, lymph node removal/radiation, anxiety, depression, cardiac hx (see chart), obesity  are also affecting patient's functional outcome.   REHAB POTENTIAL: Fair for goals  CLINICAL DECISION MAKING: Unstable/unpredictable  EVALUATION COMPLEXITY: High  GOALS: Goals reviewed with patient? Yes  SHORT TERM GOALS: Target date: 10/25/22  (6 weeks)  Pt will perform HEP with set up/min vc for promoting lymph flow and LUE flexibility for better engagement of the LUE with daily tasks. Baseline: Eval: HEP not yet initiated Goal status: INITIAL  2.  Pt will demo accurate technique to apply and wear layered compression bandages as instructed on the LUE 23 hours/day with 75% or better consistency in order to reduce LUE limb volume.   Baseline: Eval: Will plan to issue and begin bandaging/educ in the next 1-2 sessions.  Goal status: Initial  3.  Pt/caregiver will be indep to verbalize 3-4 lymphedema precautions to prevent exacerbations and/or infections.  Baseline: Eval: educ not yet initiated.  Goal status: Initial   LONG TERM GOALS: Target date: 12/06/22 (12 weeks)  Pt will increase FOTO score to (TBD) or better to indicate improvement in self perceived functional use of the L arm with daily tasks. Baseline: Eval: FOTO to be completed next session (time constraints at eval) Goal status: INITIAL  2. Pt/caregiver will demo LUE MLD techniques with min vc or less and use of visual handout to promote fluid mobilization in the LUE.     Baseline: Eval: Educ not yet initiated  Goal status: Initial  3. Pt to measure 8-10% or more limb volume reduction in the LUE in order to promote healthy skin integrity, reduce infection risk, improve fit of clothing, and ease ability to engage the LUE into daily tasks.  Baseline: Eval: initial circumferential measures indicate LUE to be ~15% larger than RUE, but will plan to measure actual volumetrics in upcoming tx session. Goal status: Initial  4. Pt to be fitted with LUE compression garment for long-term maintenance of fluid loss in the LUE.  Baseline: Eval: to be fit during last phase of CDT program  Goal status: Initial  PLAN:  PT FREQUENCY: 2x/week  PT DURATION: 12 weeks  PLANNED INTERVENTIONS: Therapeutic exercises, Therapeutic activity, Patient/Family education, Self Care, DME instructions, Manual lymph drainage, Compression bandaging, scar mobilization, Taping, Vasopneumatic device, and Manual therapy  PLAN FOR NEXT SESSION: see above  Alicia Earthly, MS, OTR/L, CLT  Alicia [name], OT 10/06/2022, 10:10 PM

## 2022-10-08 ENCOUNTER — Inpatient Hospital Stay: Payer: Medicare Other

## 2022-10-08 ENCOUNTER — Encounter: Payer: Self-pay | Admitting: Nurse Practitioner

## 2022-10-08 ENCOUNTER — Inpatient Hospital Stay (HOSPITAL_BASED_OUTPATIENT_CLINIC_OR_DEPARTMENT_OTHER): Payer: Medicare Other | Admitting: Nurse Practitioner

## 2022-10-08 VITALS — BP 110/60 | HR 81 | Temp 97.2°F | Wt 190.0 lb

## 2022-10-08 DIAGNOSIS — Z556 Problems related to health literacy: Secondary | ICD-10-CM | POA: Diagnosis not present

## 2022-10-08 DIAGNOSIS — Z86711 Personal history of pulmonary embolism: Secondary | ICD-10-CM | POA: Diagnosis not present

## 2022-10-08 DIAGNOSIS — F419 Anxiety disorder, unspecified: Secondary | ICD-10-CM | POA: Diagnosis not present

## 2022-10-08 DIAGNOSIS — T50905A Adverse effect of unspecified drugs, medicaments and biological substances, initial encounter: Secondary | ICD-10-CM | POA: Diagnosis not present

## 2022-10-08 DIAGNOSIS — Z5982 Transportation insecurity: Secondary | ICD-10-CM | POA: Diagnosis not present

## 2022-10-08 DIAGNOSIS — L03116 Cellulitis of left lower limb: Secondary | ICD-10-CM | POA: Diagnosis not present

## 2022-10-08 DIAGNOSIS — M6259 Muscle wasting and atrophy, not elsewhere classified, multiple sites: Secondary | ICD-10-CM | POA: Diagnosis not present

## 2022-10-08 DIAGNOSIS — I719 Aortic aneurysm of unspecified site, without rupture: Secondary | ICD-10-CM | POA: Diagnosis not present

## 2022-10-08 DIAGNOSIS — G629 Polyneuropathy, unspecified: Secondary | ICD-10-CM | POA: Diagnosis not present

## 2022-10-08 DIAGNOSIS — E039 Hypothyroidism, unspecified: Secondary | ICD-10-CM | POA: Diagnosis not present

## 2022-10-08 DIAGNOSIS — L03115 Cellulitis of right lower limb: Secondary | ICD-10-CM | POA: Diagnosis not present

## 2022-10-08 DIAGNOSIS — F32A Depression, unspecified: Secondary | ICD-10-CM | POA: Diagnosis not present

## 2022-10-08 DIAGNOSIS — E876 Hypokalemia: Secondary | ICD-10-CM | POA: Diagnosis not present

## 2022-10-08 DIAGNOSIS — N179 Acute kidney failure, unspecified: Secondary | ICD-10-CM

## 2022-10-08 DIAGNOSIS — C50912 Malignant neoplasm of unspecified site of left female breast: Secondary | ICD-10-CM

## 2022-10-08 DIAGNOSIS — C7951 Secondary malignant neoplasm of bone: Secondary | ICD-10-CM | POA: Diagnosis not present

## 2022-10-08 DIAGNOSIS — A419 Sepsis, unspecified organism: Secondary | ICD-10-CM | POA: Diagnosis not present

## 2022-10-08 DIAGNOSIS — M8580 Other specified disorders of bone density and structure, unspecified site: Secondary | ICD-10-CM | POA: Diagnosis not present

## 2022-10-08 DIAGNOSIS — K5904 Chronic idiopathic constipation: Secondary | ICD-10-CM | POA: Diagnosis not present

## 2022-10-08 DIAGNOSIS — Z853 Personal history of malignant neoplasm of breast: Secondary | ICD-10-CM | POA: Diagnosis not present

## 2022-10-08 DIAGNOSIS — Z85830 Personal history of malignant neoplasm of bone: Secondary | ICD-10-CM | POA: Diagnosis not present

## 2022-10-08 DIAGNOSIS — G4733 Obstructive sleep apnea (adult) (pediatric): Secondary | ICD-10-CM | POA: Diagnosis not present

## 2022-10-08 DIAGNOSIS — I10 Essential (primary) hypertension: Secondary | ICD-10-CM | POA: Diagnosis not present

## 2022-10-08 DIAGNOSIS — N390 Urinary tract infection, site not specified: Secondary | ICD-10-CM | POA: Diagnosis not present

## 2022-10-08 DIAGNOSIS — D649 Anemia, unspecified: Secondary | ICD-10-CM | POA: Diagnosis not present

## 2022-10-08 DIAGNOSIS — Z170 Estrogen receptor positive status [ER+]: Secondary | ICD-10-CM | POA: Diagnosis not present

## 2022-10-08 DIAGNOSIS — J189 Pneumonia, unspecified organism: Secondary | ICD-10-CM | POA: Diagnosis not present

## 2022-10-08 LAB — CMP (CANCER CENTER ONLY)
ALT: 16 U/L (ref 0–44)
AST: 26 U/L (ref 15–41)
Albumin: 3.5 g/dL (ref 3.5–5.0)
Alkaline Phosphatase: 111 U/L (ref 38–126)
Anion gap: 13 (ref 5–15)
BUN: 16 mg/dL (ref 8–23)
CO2: 25 mmol/L (ref 22–32)
Calcium: 9.8 mg/dL (ref 8.9–10.3)
Chloride: 99 mmol/L (ref 98–111)
Creatinine: 1.44 mg/dL — ABNORMAL HIGH (ref 0.44–1.00)
GFR, Estimated: 38 mL/min — ABNORMAL LOW (ref >60)
Glucose, Bld: 129 mg/dL — ABNORMAL HIGH (ref 70–99)
Potassium: 4.1 mmol/L (ref 3.5–5.1)
Sodium: 137 mmol/L (ref 135–145)
Total Bilirubin: 0.5 mg/dL (ref 0.3–1.2)
Total Protein: 6.7 g/dL (ref 6.5–8.1)

## 2022-10-08 LAB — CBC WITH DIFFERENTIAL (CANCER CENTER ONLY)
Abs Immature Granulocytes: 0.04 10*3/uL (ref 0.00–0.07)
Basophils Absolute: 0 10*3/uL (ref 0.0–0.1)
Basophils Relative: 0 %
Eosinophils Absolute: 0.1 10*3/uL (ref 0.0–0.5)
Eosinophils Relative: 3 %
HCT: 32.9 % — ABNORMAL LOW (ref 36.0–46.0)
Hemoglobin: 10.5 g/dL — ABNORMAL LOW (ref 12.0–15.0)
Immature Granulocytes: 1 %
Lymphocytes Relative: 7 %
Lymphs Abs: 0.3 10*3/uL — ABNORMAL LOW (ref 0.7–4.0)
MCH: 28.9 pg (ref 26.0–34.0)
MCHC: 31.9 g/dL (ref 30.0–36.0)
MCV: 90.6 fL (ref 80.0–100.0)
Monocytes Absolute: 0.5 10*3/uL (ref 0.1–1.0)
Monocytes Relative: 12 %
Neutro Abs: 3.3 10*3/uL (ref 1.7–7.7)
Neutrophils Relative %: 77 %
Platelet Count: 249 10*3/uL (ref 150–400)
RBC: 3.63 MIL/uL — ABNORMAL LOW (ref 3.87–5.11)
RDW: 16.3 % — ABNORMAL HIGH (ref 11.5–15.5)
WBC Count: 4.3 10*3/uL (ref 4.0–10.5)
nRBC: 0 % (ref 0.0–0.2)

## 2022-10-08 MED ORDER — LINACLOTIDE 145 MCG PO CAPS: 145.0000 ug | ORAL_CAPSULE | Freq: Every day | ORAL | 1 refills | Status: DC

## 2022-10-08 NOTE — Progress Notes (Signed)
[facility]  [facility] A Department of the [place]. [facility] [address] [place], [place] [address] [phone] (phone) [phone] (fax)  [name], [name], [name], [name] Jim Like, RN as Registered Nurse Orlie Dakin, Tollie Pizza, MD as Consulting Physician (Oncology) Carmina Miller, MD as Referring Physician (Radiation Oncology) Chriss Driver, RN as Registered Nurse   Name of the patient: Alicia [name]  [id number]  1950-05-18   Date of visit: 10/08/22  Diagnosis- metastatic breast cancer  Chief complaint/ Reason for visit- constipation, dry mouth, malaise  Heme/Onc history:  Oncology History Overview Note  Patient is a 72 year old female who recently self palpated a mass on her left breast.  Subsequent imaging and biopsy revealed the above-stated breast cancer.  Case was also discussed extensively at case conference.  Given the size and the stage of patient's malignancy, she will benefit from neoadjuvant chemotherapy using Adriamycin, Cytoxan, and Taxol.  Patient will also require Neulasta support.  Will get CT scan of the chest, abdomen, and pelvis to assess for any metastatic disease.  Patient will also require port placement and MUGA prior to initiating treatment  CT abdomen/pelvis/chest did not reveal any suspicious lesions concerning for metastatic disease. (08/01/17)  Port-A-Cath placed on 08/07/2017.  Cycle 1 day 1 of AC was given on 08/08/17.     Breast cancer, stage 2, left (HCC)  07/24/2017 Initial Diagnosis   Breast cancer, stage 2, left (HCC)   07/30/2017 Cancer Staging   Staging form: Breast, AJCC 8th Edition - Clinical stage from 07/30/2017: Stage IIA (cT2, cN1, cM0, G2, ER+, PR+, HER2-) - Signed by Jeralyn Ruths, MD on 07/30/2017   08/08/2017 - 10/31/2017 Chemotherapy    The patient had DOXOrubicin (ADRIAMYCIN) chemo injection 130 mg, 60 mg/m2 = 130 mg, Intravenous,  Once, 4 of 4 cycles Administration: 130 mg (08/08/2017), 130 mg (08/22/2017), 130 mg (09/05/2017), 130 mg (09/19/2017) palonosetron (ALOXI) injection 0.25 mg, 0.25 mg, Intravenous,  Once, 4 of 4 cycles Administration: 0.25 mg (08/08/2017), 0.25 mg (08/22/2017), 0.25 mg (09/05/2017), 0.25 mg (09/19/2017) pegfilgrastim-cbqv (UDENYCA) injection 6 mg, 6 mg, Subcutaneous, Once, 4 of 4 cycles Administration: 6 mg (08/09/2017), 6 mg (08/23/2017), 6 mg (09/06/2017), 6 mg (09/20/2017) cyclophosphamide (CYTOXAN) 1,300 mg in sodium chloride 0.9 % 250 mL chemo infusion, 600 mg/m2 = 1,300 mg, Intravenous,  Once, 4 of 4 cycles Administration: 1,300 mg (08/08/2017), 1,300 mg (08/22/2017), 1,300 mg (09/05/2017), 1,300 mg (09/19/2017) PACLitaxel (TAXOL) 174 mg in sodium chloride 0.9 % 250 mL chemo infusion (</= 80mg /m2), 80 mg/m2 = 174 mg, Intravenous,  Once, 4 of 12 cycles Dose modification: 72 mg/m2 (original dose 80 mg/m2, Cycle 6, Reason: Dose not tolerated) Administration: 174 mg (10/03/2017), 156 mg (10/17/2017), 156 mg (10/24/2017), 156 mg (10/31/2017) fosaprepitant (EMEND) 150 mg, dexamethasone (DECADRON) 12 mg in sodium chloride 0.9 % 145 mL IVPB, , Intravenous,  Once, 4 of 4 cycles Administration:  (08/08/2017),  (08/22/2017),  (09/05/2017),  (09/19/2017)  for chemotherapy treatment.      Interval history- Patient is 72 year old female, on everolimus - aromasin, who [facility] for complaints of constipation, generalized lethargy, and malaise. She has been on 2500 cc fluid restriction per cardiology which she feels is contributing. She has small bowel movements that are hard. She complains of dry mouth. Weight has been down. Denies shortness of breath or cough.  ECOG FS:2 - Symptomatic, <50% confined to bed  Review of systems- Review of Systems  Constitutional:  Positive for malaise/fatigue and weight loss.  Negative for chills and fever.  HENT:  Negative for hearing loss, nosebleeds, sore throat and tinnitus.   Eyes:  Negative for blurred vision and double vision.  Respiratory:  Negative for cough, hemoptysis, shortness of breath and wheezing.   Cardiovascular:  Negative for chest pain, palpitations and leg swelling.  Gastrointestinal:  Positive for constipation. Negative for abdominal pain, blood in stool, diarrhea, melena, nausea and vomiting.  Genitourinary:  Negative for dysuria and urgency.  Musculoskeletal:  Negative for back pain, falls, joint pain and myalgias.  Skin:  Negative for itching and rash.  Neurological:  Negative for dizziness, tingling, sensory change, loss of consciousness, weakness and headaches.  Endo/Heme/Allergies:  Negative for environmental allergies. Does not bruise/bleed easily.  Psychiatric/Behavioral:  Negative for depression. The patient is not nervous/anxious and does not have insomnia.      Allergies  Allergen Reactions   Sulfa Antibiotics Diarrhea    Past Medical History:  Diagnosis Date   Anxiety    Breast cancer, left (HCC) 07/2017   Depression    Hypertension    Hypothyroidism    Personal history of chemotherapy 2019   LEFT mastectomy-chemo before   Personal history of radiation therapy 03/2018   LEFT mastectomy   Rapid heart rate    Thyroid disease     Past Surgical History:  Procedure Laterality Date   AXILLARY LYMPH NODE BIOPSY Left 07/16/2017   METASTATIC MAMMARY CARCINOMA   BREAST BIOPSY Left 07/16/2017   [place] bx of left breast mass and left breast LN.  INVASIVE MAMMARY CARCINOMA, NO SPECIAL TYPE.    BREAST EXCISIONAL BIOPSY Right 2001   benign   BREAST LUMPECTOMY WITH SENTINEL LYMPH NODE BIOPSY Left 12/06/2017   Procedure: BREAST LUMPECTOMY WITH SENTINEL LYMPH NODE BX;  Surgeon: Earline Mayotte, MD;  Location: [facility] ORS;  Service: General;  Laterality: Left;   COLONOSCOPY     MASTECTOMY Left 12/23/2017   PORTACATH PLACEMENT Right  08/07/2017   Procedure: INSERTION PORT-A-CATH;  Surgeon: Earline Mayotte, MD;  Location: [facility] ORS;  Service: General;  Laterality: Right;   SIMPLE MASTECTOMY WITH AXILLARY SENTINEL NODE BIOPSY Left 12/23/2017   T2,N2 with 6/7 nodes positive. Whole breast radiation.  Surgeon: Earline Mayotte, MD;  Location: [facility] ORS;  Service: General;  Laterality: Left;   TONSILLECTOMY      Social History   Socioeconomic History   Marital status: Widowed    Spouse name: Not on file   Number of children: Not on file   Years of education: Not on file   Highest education level: Not on file  Occupational History   Not on file  Tobacco Use   Smoking status: Former    Current packs/day: 0.00    Average packs/day: 1 pack/day for 30.0 years (30.0 ttl pk-yrs)    Types: Cigarettes    Start date: 52    Quit date: 2019    Years since quitting: 5.7   Smokeless tobacco: Never  Vaping Use   Vaping status: Never Used  Substance and Sexual Activity   Alcohol use: Not Currently   Drug use: Never   Sexual activity: Not Currently  Other Topics Concern   Not on file  Social History Narrative   Not on file   Social Determinants of Health   Financial Resource Strain: High Risk (03/09/2022)   Overall Financial Resource  Strain (CARDIA)    Difficulty of Paying Living Expenses: Hard  Food Insecurity: No Food Insecurity (07/18/2022)   Hunger Vital Sign    Worried About Running Out of Food in the Last Year: Never true    Ran Out of Food in the Last Year: Never true  Transportation Needs: No Transportation Needs (07/18/2022)   PRAPARE - Administrator, Civil Service (Medical): No    Lack of Transportation (Non-Medical): No  Physical Activity: Insufficiently Active (03/09/2022)   Exercise Vital Sign    Days of Exercise per Week: 3 days    Minutes of Exercise per Session: 30 min  Stress: No Stress Concern Present (03/09/2022)   [employer] of Occupational Health - Occupational Stress  Questionnaire    Feeling of Stress : Only a little  Social Connections: Socially Isolated (03/09/2022)   Social Connection and Isolation Panel [NHANES]    Frequency of Communication with Friends and Family: Twice a week    Frequency of Social Gatherings with Friends and Family: Once a week    Attends Religious Services: Never    Database administrator or Organizations: No    Attends Banker Meetings: Never    Marital Status: Widowed  Intimate Partner Violence: Not At Risk (07/18/2022)   Humiliation, Afraid, Rape, and Kick questionnaire    Fear of Current or Ex-Partner: No    Emotionally Abused: No    Physically Abused: No    Sexually Abused: No    Family History  Problem Relation Age of Onset   Stroke Mother    Thyroid disease Mother    Renal Disease Mother    Stroke Father    Heart attack Father    Sudden death Father 8       suicide   Anuerysm Brother    Breast cancer Neg Hx      Current Outpatient Medications:    acetaminophen (TYLENOL) 500 MG tablet, Take 1,000 mg by mouth every 6 (six) hours as needed for moderate pain or headache. , Disp: , Rfl:    ALPRAZolam (XANAX) 0.5 MG tablet, Take 1 tablet (0.5 mg total) by mouth 2 (two) times daily as needed for anxiety., Disp: 10 tablet, Rfl: 0   Amino Acids-Protein Hydrolys (PRO-STAT SUGAR FREE PO), Take 30 mLs by mouth 2 (two) times daily., Disp: , Rfl:    amLODipine (NORVASC) 5 MG tablet, Take 1 tablet (5 mg total) by mouth daily., Disp: 90 tablet, Rfl: 3   ascorbic acid (VITAMIN C) 500 MG tablet, Take 500 mg by mouth daily., Disp: , Rfl:    aspirin EC 81 MG tablet, Take 81 mg by mouth at bedtime. , Disp: , Rfl:    calcium carbonate (OS-CAL - DOSED IN MG OF ELEMENTAL CALCIUM) 1250 (500 Ca) MG tablet, Take 1 tablet by mouth daily with breakfast., Disp: , Rfl:    Cholecalciferol (VITAMIN D3) 125 MCG (5000 UT) CAPS, Take 5,000 Units by mouth 2 (two) times a day., Disp: , Rfl:    exemestane (AROMASIN) 25 MG tablet,  Take 1 tablet (25 mg total) by mouth daily after breakfast., Disp: 30 tablet, Rfl: 2   hydrochlorothiazide (HYDRODIURIL) 12.5 MG tablet, Take 12.5 mg by mouth daily., Disp: , Rfl:    levothyroxine (SYNTHROID) 100 MCG tablet, Take 1 tablet (100 mcg total) by mouth daily before breakfast., Disp: , Rfl:    loratadine (CLARITIN) 10 MG tablet, Take 10 mg by mouth daily. Take to reduce risk of rash from  everolimus., Disp: , Rfl:    Multiple Vitamin (MULTIVITAMIN WITH MINERALS) TABS tablet, Take 1 tablet by mouth daily., Disp: , Rfl:    pantoprazole (PROTONIX) 20 MG tablet, TAKE 1 TABLET(20 MG) BY MOUTH DAILY, Disp: 90 tablet, Rfl: 0   potassium chloride SA (KLOR-CON M) 20 MEQ tablet, TAKE 1 TABLET BY MOUTH TWICE DAILY (Patient taking differently: Take 20 mEq by mouth 2 (two) times daily.), Disp: 60 tablet, Rfl: 3   Probiotic Product (ALIGN) 4 MG CAPS, Take 1 capsule by mouth daily., Disp: , Rfl:    rosuvastatin (CRESTOR) 5 MG tablet, Take 1 tablet (5 mg total) by mouth daily., Disp: 90 tablet, Rfl: 3   Sennosides-Docusate Sodium (SENNA PLUS PO), Take 1 capsule by mouth as needed (constipation)., Disp: , Rfl:    traMADol (ULTRAM) 50 MG tablet, Take 1 tablet (50 mg total) by mouth every 6 (six) hours as needed., Disp: 60 tablet, Rfl: 0   traZODone (DESYREL) 50 MG tablet, Take 1 tablet (50 mg total) by mouth at bedtime., Disp: 30 tablet, Rfl: 2   metoprolol tartrate (LOPRESSOR) 25 MG tablet, Take 0.5 tablets (12.5 mg total) by mouth 2 (two) times daily. (Patient not taking: Reported on 09/28/2022), Disp: , Rfl:    traZODone (DESYREL) 50 MG tablet, Take 1 tablet (50 mg total) by mouth at bedtime. (Patient not taking: Reported on 09/28/2022), Disp: , Rfl:   Physical exam:  Vitals:   10/08/22 0915  BP: 110/60  Pulse: 81  Temp: (!) 97.2 F (36.2 C)  TempSrc: Tympanic  SpO2: 98%  Weight: 190 lb (86.2 kg)   Physical Exam Constitutional:      Appearance: She is not ill-appearing.  Cardiovascular:      Rate and Rhythm: Normal rate and regular rhythm.  Pulmonary:     Effort: No respiratory distress.     Breath sounds: No wheezing or rhonchi.  Abdominal:     General: There is no distension.     Tenderness: There is no abdominal tenderness.  Musculoskeletal:     Right lower leg: No edema.     Left lower leg: No edema.  Neurological:     Mental Status: She is alert and oriented to person, place, and time.  Psychiatric:        Mood and Affect: Mood normal.        Behavior: Behavior normal.         Latest Ref Rng & Units 10/08/2022    9:02 AM  CMP  Glucose 70 - 99 mg/dL 956   BUN 8 - 23 mg/dL 16   Creatinine 2.13 - 1.00 mg/dL 0.86   Sodium 578 - 469 mmol/L 137   Potassium 3.5 - 5.1 mmol/L 4.1   Chloride 98 - 111 mmol/L 99   CO2 22 - 32 mmol/L 25   Calcium 8.9 - 10.3 mg/dL 9.8   Total Protein 6.5 - 8.1 g/dL 6.7   Total Bilirubin 0.3 - 1.2 mg/dL 0.5   Alkaline Phos 38 - 126 U/L 111   AST 15 - 41 U/L 26   ALT 0 - 44 U/L 16       Latest Ref Rng & Units 10/08/2022    9:02 AM  CBC  WBC 4.0 - 10.5 K/uL 4.3   Hemoglobin 12.0 - 15.0 g/dL 62.9   Hematocrit 52.8 - 46.0 % 32.9   Platelets 150 - 400 K/uL 249     No images are attached to the encounter.  No results found.  Assessment and plan- Patient is a 72 y.o. 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 Afinitor has been on hold during hospitalization. CA 27-29 has been down trending consistent with response. Afinitor was dose reduced to 5 mg. Recommend restarting as I suspect her primary symptoms are more related to her fluid intake and constipation. She is agreeable.  Constipation- Secondary to poor fiber intake, reduced mobility, diuresis, and restricted fluids. Hold imodium which she previously took for diarrhea. No improvement with senna and miralax. Will trial linzess 145 mcg daily for constipation. If not covered by insurance can  consider lactulose. Hold if diarrhea. Notify clinic if worsening pain or no BM in 48 hours.  CHF with preserved EF- EF previously 55-60%. Awaiting repeat echo with cardiology. pBNP mildly elevated previously. Symptomatic with peripheral edema, weight gain. She has been on fluid restriction and hydrochlorothiazide 12.5 mg. I reached out to cardiology as I suspect diuretic and fluid restriction is contributing to constipation which limits her intake and thus general malaise. Afinitor may also be contributing to fluid retention. Overall, improved though. Cardiology in agreement to continue fluid restriction at least through echo but ok to hold hydrochlorothiazide and continue to monitor weights and symptoms. Patient in agreement.  Anemia: Chronic and unchanged.  Patient's hemoglobin is 10.5. Improving.  Renal insufficiency: Mild creatinine 1.44 today. GFR down to 38. Suspect diuresis. Repeat with [name] as scheduled.   Disposition:  Follow up as scheduled. RTC sooner if symptoms don't improve or worsen.     Visit Diagnosis 1. Chronic idiopathic constipation   2. Medication side effect, initial encounter   3. AKI (acute kidney injury) [facility])     Patient expressed understanding and was in agreement with this plan. She also understands that She can call clinic at any time with any questions, concerns, or complaints.   Thank you for allowing me to participate in the care of this very pleasant patient.   Consuello Masse, DNP, AGNP-C, AOCNP [facility] [phone]

## 2022-10-09 ENCOUNTER — Ambulatory Visit

## 2022-10-09 DIAGNOSIS — I972 Postmastectomy lymphedema syndrome: Secondary | ICD-10-CM

## 2022-10-09 DIAGNOSIS — R278 Other lack of coordination: Secondary | ICD-10-CM | POA: Diagnosis not present

## 2022-10-09 DIAGNOSIS — M6281 Muscle weakness (generalized): Secondary | ICD-10-CM

## 2022-10-09 DIAGNOSIS — A419 Sepsis, unspecified organism: Secondary | ICD-10-CM | POA: Diagnosis not present

## 2022-10-09 DIAGNOSIS — I719 Aortic aneurysm of unspecified site, without rupture: Secondary | ICD-10-CM | POA: Diagnosis not present

## 2022-10-09 DIAGNOSIS — G4733 Obstructive sleep apnea (adult) (pediatric): Secondary | ICD-10-CM | POA: Diagnosis not present

## 2022-10-09 DIAGNOSIS — J189 Pneumonia, unspecified organism: Secondary | ICD-10-CM | POA: Diagnosis not present

## 2022-10-09 DIAGNOSIS — I10 Essential (primary) hypertension: Secondary | ICD-10-CM | POA: Diagnosis not present

## 2022-10-09 DIAGNOSIS — N390 Urinary tract infection, site not specified: Secondary | ICD-10-CM | POA: Diagnosis not present

## 2022-10-10 ENCOUNTER — Other Ambulatory Visit: Payer: Self-pay

## 2022-10-10 ENCOUNTER — Other Ambulatory Visit: Payer: Self-pay | Admitting: Pharmacist

## 2022-10-10 ENCOUNTER — Telehealth: Payer: Self-pay | Admitting: *Deleted

## 2022-10-10 DIAGNOSIS — A419 Sepsis, unspecified organism: Secondary | ICD-10-CM | POA: Diagnosis not present

## 2022-10-10 DIAGNOSIS — I10 Essential (primary) hypertension: Secondary | ICD-10-CM | POA: Diagnosis not present

## 2022-10-10 DIAGNOSIS — C50912 Malignant neoplasm of unspecified site of left female breast: Secondary | ICD-10-CM

## 2022-10-10 DIAGNOSIS — G4733 Obstructive sleep apnea (adult) (pediatric): Secondary | ICD-10-CM | POA: Diagnosis not present

## 2022-10-10 DIAGNOSIS — N390 Urinary tract infection, site not specified: Secondary | ICD-10-CM | POA: Diagnosis not present

## 2022-10-10 DIAGNOSIS — I719 Aortic aneurysm of unspecified site, without rupture: Secondary | ICD-10-CM | POA: Diagnosis not present

## 2022-10-10 DIAGNOSIS — J189 Pneumonia, unspecified organism: Secondary | ICD-10-CM | POA: Diagnosis not present

## 2022-10-10 NOTE — Telephone Encounter (Signed)
Patient called asking for a prescription for a compression glove for her lymphedema. Please notifiy her when done

## 2022-10-11 ENCOUNTER — Ambulatory Visit

## 2022-10-11 DIAGNOSIS — M6281 Muscle weakness (generalized): Secondary | ICD-10-CM

## 2022-10-11 DIAGNOSIS — I972 Postmastectomy lymphedema syndrome: Secondary | ICD-10-CM | POA: Diagnosis not present

## 2022-10-11 DIAGNOSIS — R278 Other lack of coordination: Secondary | ICD-10-CM | POA: Diagnosis not present

## 2022-10-11 NOTE — Therapy (Signed)
OUTPATIENT OT UPPER EXTREMITY LYMPHEDEMA TREATMENT NOTE  Patient Name: Alicia [name] MRN: [id number] DOB:[birth year], 72 y.o., female Today's Date: 10/11/2022  END OF SESSION:   OT End of Session - 10/11/22 1149     Visit Number 7    Number of Visits 24    Date for OT Re-Evaluation 12/06/22    Progress Note Due on Visit 10    OT Start Time 1530    OT Stop Time 1615    OT Time Calculation (min) 45 min    Equipment Utilized During Treatment transport chair    Activity Tolerance Patient tolerated treatment well    Behavior During Therapy WFL for tasks assessed/performed            Past Medical History:  Diagnosis Date   Anxiety    Breast cancer, left (HCC) 07/2017   Depression    Hypertension    Hypothyroidism    Personal history of chemotherapy 2019   LEFT mastectomy-chemo before   Personal history of radiation therapy 03/2018   LEFT mastectomy   Rapid heart rate    Thyroid disease    Past Surgical History:  Procedure Laterality Date   AXILLARY LYMPH NODE BIOPSY Left 07/16/2017   METASTATIC MAMMARY CARCINOMA   BREAST BIOPSY Left 07/16/2017   [place] bx of left breast mass and left breast LN.  INVASIVE MAMMARY CARCINOMA, NO SPECIAL TYPE.    BREAST EXCISIONAL BIOPSY Right 2001   benign   BREAST LUMPECTOMY WITH SENTINEL LYMPH NODE BIOPSY Left 12/06/2017   Procedure: BREAST LUMPECTOMY WITH SENTINEL LYMPH NODE BX;  Surgeon: Alicia Mayotte, MD;  Location: [facility];  Service: General;  Laterality: Left;   COLONOSCOPY     MASTECTOMY Left 12/23/2017   PORTACATH PLACEMENT Right 08/07/2017   Procedure: INSERTION PORT-A-CATH;  Surgeon: Alicia Mayotte, MD;  Location: [facility];  Service: General;  Laterality: Right;   SIMPLE MASTECTOMY WITH AXILLARY SENTINEL NODE BIOPSY Left 12/23/2017   T2,N2 with 6/7 nodes positive. Whole breast radiation.  Surgeon: Alicia Mayotte, MD;  Location: [facility];  Service: General;  Laterality: Left;   TONSILLECTOMY     Patient Active  Problem List   Diagnosis Date Noted   Pneumonia 07/24/2022   Palliative care encounter 07/24/2022   Malnutrition of moderate degree 07/20/2022   Acute UTI 07/20/2022   Pressure injury of skin 07/20/2022   Hypokalemia 07/19/2022   Hypophosphatemia 07/19/2022   Acute metabolic encephalopathy 07/19/2022   Generalized weakness 07/18/2022   AKI (acute kidney injury) (HCC) 07/18/2022   Electrolyte abnormality 07/18/2022   QT prolongation 07/18/2022   Diarrhea 07/18/2022   Altered mental status 07/18/2022   Contact dermatitis 05/10/2021   Hematuria 03/31/2021   Urinary tract infection without hematuria 03/01/2021   PSVT (paroxysmal supraventricular tachycardia) 02/20/2020   Mobitz type 1 second degree AV block 02/20/2020   Pure hypercholesterolemia 02/20/2020   Daytime somnolence 02/18/2020   Chest pain 11/13/2019   Palpitations 11/13/2019   Obesity (BMI 35.0-39.9 without comorbidity) 11/08/2019   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 Corky [name]  REFERRING PROVIDER: Clent Jacks, PA-C  REFERRING DIAG:  Diagnosis  R60.0 (ICD-10-CM) -  Edema of left upper extremity  C50.912,C79.51 (ICD-10-CM) - Carcinoma of left breast metastatic to bone (HCC)  I89.0 (ICD-10-CM) - Lymphedema   THERAPY DIAG:  Muscle weakness (generalized)  Postmastectomy lymphedema syndrome  Other lack of coordination  ONSET DATE: 07/2022  Rationale for Evaluation and Treatment: Rehabilitation  SUBJECTIVE                                                                                                                                                                                           SUBJECTIVE STATEMENT:  Pt reports she plans to go and get measured for a compression glove following OT this afternoon. 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  LUE lymphedema, BLE edema.  Per chart from Dr. Orlie [name] on 08/15/22: ONCOLOGY HISTORY: Patient initially received neoadjuvant chemotherapy with Adriamycin and Cytoxan followed by weekly Taxol. She only received 4 cycles of weekly Taxol prior to discontinuation of treatment on October 31, 2017 secondary to persistent peripheral neuropathy. She ultimately required mastectomy and final pathology noted 5 of 6 lymph nodes positive for disease. Patient completed adjuvant XRT in mid April 2020. Pain initiated letrozole in May 2020, this was discontinued secondary to side effects and patient was started on anastrozole. Nuclear medicine bone scan on June 19, 2019 revealed metastatic disease and anastrozole was subsequently discontinued. PET scan results from October 02, 2021 revealed no clear evidence of metastatic progression. PET scan results from June 30, 2019 reviewed independently with metastatic bony disease, but no obvious evidence of visceral disease. MRI of the brain on August 24, 2019 reviewed independently with no obvious evidence of metastatic disease.   Pt reports that she was hospitalized in July for a UTI which resulted in sepsis.  Pt was discharged from the hospital and sent to rehab at [facility] where she states her lymphedema worsened there at the end of July.  Pt reports she has been given 2 different antibiotics, and she has 2 days left of Doxycycline, though she reports she has a refill.  OT encouraged pt/family follow up with MD to see if they would like her to refill the medication, as pt does still have mild heat radiating from the forearm and pinkish tint to this area.  Pt has also  had recent BP med changes, stating that she was on hydrochlorothiazide, got switched to lasix, and now is back on hydrochlorothiazide.  Pt returns for cardiology follow up Sept 13th and she reports the swelling in her BLEs is new.   Per chart from visit with Alicia Jacks, PA on 09/12/22:   Patient  is here to follow up on left arm swelling and pain. She has had lymphedema for quite some time secondary to her breast surgeries/lymph node removal. After being in the hospital for altered mental status/dehydration/AKI and rehabilitation, the arm swelling and pain worsened. At her last visit on 09/05/2022 a doppler was performed which showed no evidence of DVT of arm. She was started on a 10 day course of doxycyline.    Today she reports that the swelling is about the same, if not worse. She has also developed gradually worsening peripheral edema since her rehabilitation facility switched her off of hydrochlorothiazide 12.5 mg onto lasix (unknown dose). She denies SOB but is having trouble ambulating due to the swelling and discomfort. She is unsure about weight changes. She has been fluid restricting.   PAIN: 10/09/22: Pt denies any LUE pain this date Are you having pain? Yes NPRS scale: 1-3/10 Pain location: LUE Pain orientation: Left  PAIN TYPE: aching, tight, and tingling, heavy Pain description: constant  Aggravating factors: reaching up Relieving factors: rest, elevating the arm  PRECAUTIONS: None  WEIGHT BEARING RESTRICTIONS: No  FALLS:  Has patient fallen in last 6 months? No  LIVING ENVIRONMENT: Lives with: lives with their family, lives with Lowella Bandy, daughter (who works from home), and son, [name] Lives in: 2 level home Stairs: Yes: Internal: 1 flight steps; on right going up and bilateral but cannot reach both and External: 1 steps; none Has following equipment at home: Dan Humphreys - 2 wheeled and [employer] - 4 wheeled (uses 2 wheeled downstairs and 4 wheeled upstairs on carpet)  OCCUPATION:  retired  LEISURE: watch tv, read  PRIOR LEVEL OF FUNCTION: Independent and living alone until her d/c from [facility] rehab end of July 2024, at which point pt moved in with daughter and son d/t requiring increased level of care.  PATIENT GOALS: "Reduce the swelling in my L arm and my legs and build up my strength."  OBJECTIVE  COGNITION:  Overall cognitive status: Within functional limits for tasks assessed ; 0x4, but does appear slightly forgetful  PALPATION: LUE skin is soft in the upper arm, more taut in the forearm and hand, noting at least 2+ pitting edema in the L forearm, hand, and BLEs.  L forearm is slightly warm to the touch and has a pinkish tint as compared to R unaffected side.  OBSERVATIONS: Pt and daughter pleasant, cooperative, and receptive to initial discussion of care plan.   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  LUE does appear quite heavy, with pt using R hand to cradle the arm at times.   HAND DOMINANCE: Right  UPPER EXTREMITY AROM:  AROM RIGHT   eval   Shoulder extension   Shoulder flexion WFL  Shoulder abduction [facility]  Shoulder internal rotation WNL  Shoulder external rotation WFL    (Blank rows = not tested)  AROM LEFT   eval  Shoulder extension   Shoulder flexion 111*  Shoulder abduction 100*  Shoulder internal rotation WNL  Shoulder external rotation 85* with arm abd at 90*    (Blank rows = not tested)   CERVICAL AROM: NT at eval     Flexion   Extension   Right lateral flexion   Left lateral  flexion   Right rotation   Left rotation     UPPER EXTREMITY STRENGTH:  R shoulder flex/abd: 4-; L shoulder flex 3+ (within available range), L  shoulder abd 4- (within available range) R/L elbow flex/ext: 4+;  R/L wrist flex/ext 4+  LOWER EXTREMITY AROM/PROM: NT  LYMPHEDEMA ASSESSMENTS: see circumferential measurements noted below; additional testing to be completed in upcoming sessions as needed  SURGERY TYPE/DATE: L mastectomy 12/23/2017  NUMBER OF LYMPH NODES REMOVED: 6-7 L axillary lymph nodes removed  CHEMOTHERAPY: normally takes a chemo pill daily but currently paused until pt can get her strength back.    RADIATION: 3 rounds of radiation, with last tx being May 31st 2024; radiation targeting L hip and pelvis.  Pt reports 16 total treatments in the pelvic area.  HORMONE TREATMENT: had been taking a hormone replacement pill but recently stopped in the hospital; may restart it, per pt/daughter  INFECTIONS: question for LUE cellulitis and subsequently given ABX x2  -Pt reports she did have a sacral pu from recent hospitalization but cleared up at [facility], and denied infection.  LYMPHEDEMA ASSESSMENTS: circumferential measurements in cm   LANDMARK RIGHT eval  10 cm proximal to olecranon process 31.3 cm  Olecranon process 27.0 cm  10 cm proximal to ulnar styloid process 20.2 cm  Just proximal to ulnar styloid process 15.9 cm  Across hand at thumb web space 20.6 cm  At base of 3rd digit 6.5 cm    LANDMARK LEFT  eval  10 cm proximal to olecranon process 38.0 cm  Olecranon process 31.8 cm  10 cm proximal to ulnar styloid process 26.5 cm  Just proximal to ulnar styloid process 19.8 cm  Across hand at thumb web space 21.5 cm  At base of 3rd digit 6.6 cm    FUNCTIONAL TESTS: none  GAIT:  Distance walked: NT Assistive device utilized:  transport chair, RW Level of assistance: Modified independence short distances inside the home on level ground with RW  PATIENT SURVEYS:  FOTO To complete next session 09/18/22: 56; predicted 67  TODAY'S TREATMENT:                                                                                                                                          DATE: 10/09/22 Self Care: -Doffed LUE bandages and completed skin care.  Arm washed, lotion applied.  Instructed in and practiced donning strategies for LUE compression sleeve, using "inside out" technique and use of rubber garden glove for pushing sleeve up the arm and avoiding creases.  Issued open fingered compression glove to borrow and wear to appointment following OT visit where pt plans to get measured for glove.  Pt also to wear compression sleeve for a trial today.  Reviewed plan for pt to wear compression sleeve and glove until the evening, remove for night time.  Advised pt to don glove and sleeve again  for day time wear tomorrow.  If arm swells over night to the point of difficulty donning sleeve or glove, advised pt to return to compression bandages.  Educated pt on night time options for compression if increased swelling does occur at night time, including possibility of Jovi pack arm garment.  Pt verbalized understanding.  Manual Therapy: Reviewed MLD technique throughout the LUE, routing fluid from LUE across chest to functioning pathways at the R axillary nodes, working proximal-distal-proximal.  Pt continues to require cueing for sequence to work proximal-distal-proximal, but is showing good recall for directional strokes, always moving fluid up out of the arm toward R axilla.  PATIENT EDUCATION:  Education details: Wearing schedule for compression garments, donning strategies Person educated: Patient Education method: Explanation, demo Education comprehension: verbalized understanding and needs further education  HOME EXERCISE PROGRAM: LUE A/AAROM, diaphragmatic breathing, self MLD  ASSESSMENT:  CLINICAL IMPRESSION: L hand and arm well drained today.  Reviewed self MLD; pt requires cueing for sequence to work proximal-distal-proximal, but is showing good recall for directional strokes, always moving fluid up out of  the arm toward R axilla.  Reminded pt to refer to her written handout which covers above techniques.  Transitioned to trial of compression sleeve today rather than bandaging the arm as limb size and volume are looking good.  Reviewed wearing schedule and indications to return to bandages as needed.  Pt will leave therapy today with plans to go directly to get hand measured for compression glove, but pt was issued glove to borrow today to keep hand volume maintained.  Pt will continue to benefit from skilled OT to provide manual lymphatic drainage, therapeutic exercises/HEP progression, education on skin care and disease management, educ on possible use of vasopneumatic pump, layered compression bandaging to the LUE, and fitting for maintenance compression glove in order to reduce and later maintain LUE limb volume, promote healthy skin integrity, maximize ROM/strength, and increase functional use of the LUE for daily tasks.  Pt/family continue to be in agreement with poc.   OBJECTIVE IMPAIRMENTS: decreased activity tolerance, decreased endurance, decreased knowledge of condition, decreased knowledge of use of DME, decreased mobility, decreased ROM, decreased strength, hypomobility, increased edema, increased fascial restrictions, impaired flexibility, impaired sensation, impaired UE functional use, improper body mechanics, postural dysfunction, obesity, and pain.   ACTIVITY LIMITATIONS: carrying, lifting, bending, standing, squatting, sleeping, stairs, transfers, bed mobility, bathing, toileting, dressing, reach over head, hygiene/grooming, and locomotion level  PARTICIPATION LIMITATIONS: meal prep, cleaning, laundry, medication management, driving, shopping, and community activity  PERSONAL FACTORS: Age, Education, Fitness, Past/current experiences, Time since onset of injury/illness/exacerbation, and 3+ comorbidities: stage 4 ca, L mastectomy, lymph node removal/radiation, anxiety, depression, cardiac hx  (see chart), obesity  are also affecting patient's functional outcome.   REHAB POTENTIAL: Fair for goals  CLINICAL DECISION MAKING: Unstable/unpredictable  EVALUATION COMPLEXITY: High  GOALS: Goals reviewed with patient? Yes  SHORT TERM GOALS: Target date: 10/25/22  (6 weeks)  Pt will perform HEP with set up/min vc for promoting lymph flow and LUE flexibility for better engagement of the LUE with daily tasks. Baseline: Eval: HEP not yet initiated Goal status: INITIAL  2.  Pt will demo accurate technique to apply and wear layered compression bandages as instructed on the LUE 23 hours/day with 75% or better consistency in order to reduce LUE limb volume.   Baseline: Eval: Will plan to issue and begin bandaging/educ in the next 1-2 sessions.  Goal status: Initial  3. Pt/caregiver will be indep to verbalize  3-4 lymphedema precautions to prevent exacerbations and/or infections.  Baseline: Eval: educ not yet initiated.  Goal status: Initial   LONG TERM GOALS: Target date: 12/06/22 (12 weeks)  Pt will increase FOTO score to (TBD) or better to indicate improvement in self perceived functional use of the L arm with daily tasks. Baseline: Eval: FOTO to be completed next session (time constraints at eval) Goal status: INITIAL  2. Pt/caregiver will demo LUE MLD techniques with min vc or less and use of visual handout to promote fluid mobilization in the LUE.     Baseline: Eval: Educ not yet initiated  Goal status: Initial  3. Pt to measure 8-10% or more limb volume reduction in the LUE in order to promote healthy skin integrity, reduce infection risk, improve fit of clothing, and ease ability to engage the LUE into daily tasks.  Baseline: Eval: initial circumferential measures indicate LUE to be ~15% larger than RUE, but will plan to measure actual volumetrics in upcoming tx session. Goal status: Initial  4. Pt to be fitted with LUE compression garment for long-term maintenance of fluid  loss in the LUE.  Baseline: Eval: to be fit during last phase of CDT program  Goal status: Initial  PLAN:  PT FREQUENCY: 2x/week  PT DURATION: 12 weeks  PLANNED INTERVENTIONS: Therapeutic exercises, Therapeutic activity, Patient/Family education, Self Care, DME instructions, Manual lymph drainage, Compression bandaging, scar mobilization, Taping, Vasopneumatic device, and Manual therapy  PLAN FOR NEXT SESSION: see above  Danelle Earthly, MS, OTR/L, CLT  Otis Dials, OT 10/11/2022, 11:51 AM

## 2022-10-12 ENCOUNTER — Other Ambulatory Visit: Payer: Self-pay | Admitting: Pharmacist

## 2022-10-12 ENCOUNTER — Other Ambulatory Visit: Payer: Self-pay

## 2022-10-12 DIAGNOSIS — C50912 Malignant neoplasm of unspecified site of left female breast: Secondary | ICD-10-CM

## 2022-10-12 NOTE — Therapy (Addendum)
OUTPATIENT OT UPPER EXTREMITY LYMPHEDEMA TREATMENT NOTE  Patient Name: Alicia [name] MRN: [id number] DOB:Feb 08, 1950, 72 y.o., female Today's Date: 10/12/2022  END OF SESSION:   OT End of Session - 10/12/22 1016     Visit Number 8    Number of Visits 24    Date for OT Re-Evaluation 12/06/22    Progress Note Due on Visit 10    OT Start Time 1530    OT Stop Time 1615    OT Time Calculation (min) 45 min    Equipment Utilized During Treatment transport chair    Activity Tolerance Patient tolerated treatment well    Behavior During Therapy WFL for tasks assessed/performed            Past Medical History:  Diagnosis Date   Anxiety    Breast cancer, left (HCC) 07/2017   Depression    Hypertension    Hypothyroidism    Personal history of chemotherapy 2019   LEFT mastectomy-chemo before   Personal history of radiation therapy 03/2018   LEFT mastectomy   Rapid heart rate    Thyroid disease    Past Surgical History:  Procedure Laterality Date   AXILLARY LYMPH NODE BIOPSY Left 07/16/2017   METASTATIC MAMMARY CARCINOMA   BREAST BIOPSY Left 07/16/2017   [place] bx of left breast mass and left breast LN.  INVASIVE MAMMARY CARCINOMA, NO SPECIAL TYPE.    BREAST EXCISIONAL BIOPSY Right 2001   benign   BREAST LUMPECTOMY WITH SENTINEL LYMPH NODE BIOPSY Left 12/06/2017   Procedure: BREAST LUMPECTOMY WITH SENTINEL LYMPH NODE BX;  Surgeon: Earline Mayotte, MD;  Location: [facility];  Service: General;  Laterality: Left;   COLONOSCOPY     MASTECTOMY Left 12/23/2017   PORTACATH PLACEMENT Right 08/07/2017   Procedure: INSERTION PORT-A-CATH;  Surgeon: Earline Mayotte, MD;  Location: [facility];  Service: General;  Laterality: Right;   SIMPLE MASTECTOMY WITH AXILLARY SENTINEL NODE BIOPSY Left 12/23/2017   T2,N2 with 6/7 nodes positive. Whole breast radiation.  Surgeon: Earline Mayotte, MD;  Location: [facility];  Service: General;  Laterality: Left;   TONSILLECTOMY     Patient Active  Problem List   Diagnosis Date Noted   Pneumonia 07/24/2022   Palliative care encounter 07/24/2022   Malnutrition of moderate degree 07/20/2022   Acute UTI 07/20/2022   Pressure injury of skin 07/20/2022   Hypokalemia 07/19/2022   Hypophosphatemia 07/19/2022   Acute metabolic encephalopathy 07/19/2022   Generalized weakness 07/18/2022   AKI (acute kidney injury) (HCC) 07/18/2022   Electrolyte abnormality 07/18/2022   QT prolongation 07/18/2022   Diarrhea 07/18/2022   Altered mental status 07/18/2022   Contact dermatitis 05/10/2021   Hematuria 03/31/2021   Urinary tract infection without hematuria 03/01/2021   PSVT (paroxysmal supraventricular tachycardia) 02/20/2020   Mobitz type 1 second degree AV block 02/20/2020   Pure hypercholesterolemia 02/20/2020   Daytime somnolence 02/18/2020   Chest pain 11/13/2019   Palpitations 11/13/2019   Obesity (BMI 35.0-39.9 without comorbidity) 11/08/2019   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 Corky Downs  REFERRING PROVIDER: Clent Jacks, PA-C  REFERRING DIAG:  Diagnosis  R60.0 (ICD-10-CM) -  Edema of left upper extremity  C50.912,C79.51 (ICD-10-CM) - Carcinoma of left breast metastatic to bone (HCC)  I89.0 (ICD-10-CM) - Lymphedema   THERAPY DIAG:  Postmastectomy lymphedema syndrome  Muscle weakness (generalized)  Other lack of coordination  ONSET DATE: 07/2022  Rationale for Evaluation and Treatment: Rehabilitation  SUBJECTIVE                                                                                                                                                                                           SUBJECTIVE STATEMENT:  Pt reports she was able to go to [facility] to initiate process of ordering a compression glove.  See plan below for recommended compression.  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  LUE lymphedema, BLE edema.  Per chart from [name] on 08/15/22: ONCOLOGY HISTORY: Patient initially received neoadjuvant chemotherapy with Adriamycin and Cytoxan followed by weekly Taxol. She only received 4 cycles of weekly Taxol prior to discontinuation of treatment on October 31, 2017 secondary to persistent peripheral neuropathy. She ultimately required mastectomy and final pathology noted 5 of 6 lymph nodes positive for disease. Patient completed adjuvant XRT in mid April 2020. Pain initiated letrozole in May 2020, this was discontinued secondary to side effects and patient was started on anastrozole. Nuclear medicine bone scan on June 19, 2019 revealed metastatic disease and anastrozole was subsequently discontinued. PET scan results from October 02, 2021 revealed no clear evidence of metastatic progression. PET scan results from June 30, 2019 reviewed independently with metastatic bony disease, but no obvious evidence of visceral disease. MRI of the brain on August 24, 2019 reviewed independently with no obvious evidence of metastatic disease.   Pt reports that she was hospitalized in July for a UTI which resulted in sepsis.  Pt was discharged from the hospital and sent to rehab at [facility] where she states her lymphedema worsened there at the end of July.  Pt reports she has been given 2 different antibiotics, and she has 2 days left of Doxycycline, though she reports she has a refill.  OT encouraged pt/family follow up with MD to see if they would like her to refill the medication, as pt does still have mild heat radiating from the forearm  and pinkish tint to this area.  Pt has also had recent BP med changes, stating that she was on hydrochlorothiazide, got switched to lasix, and now is back on hydrochlorothiazide.  Pt returns for cardiology follow up Sept 13th and she reports the swelling in her BLEs is new.   Per chart from visit with  Clent Jacks, PA on 09/12/22:   Patient is here to follow up on left arm swelling and pain. She has had lymphedema for quite some time secondary to her breast surgeries/lymph node removal. After being in the hospital for altered mental status/dehydration/AKI and rehabilitation, the arm swelling and pain worsened. At her last visit on 09/05/2022 a doppler was performed which showed no evidence of DVT of arm. She was started on a 10 day course of doxycyline.    Today she reports that the swelling is about the same, if not worse. She has also developed gradually worsening peripheral edema since her rehabilitation facility switched her off of hydrochlorothiazide 12.5 mg onto lasix (unknown dose). She denies SOB but is having trouble ambulating due to the swelling and discomfort. She is unsure about weight changes. She has been fluid restricting.   PAIN: 10/11/22: Pt denies any LUE pain this date Are you having pain? Yes NPRS scale: 1-3/10 Pain location: LUE Pain orientation: Left  PAIN TYPE: aching, tight, and tingling, heavy Pain description: constant  Aggravating factors: reaching up Relieving factors: rest, elevating the arm  PRECAUTIONS: None  WEIGHT BEARING RESTRICTIONS: No  FALLS:  Has patient fallen in last 6 months? No  LIVING ENVIRONMENT: Lives with: lives with their family, lives with Lowella Bandy, daughter (who works from home), and son, [name] Lives in: 2 level home Stairs: Yes: Internal: 1 flight steps; on right going up and bilateral but cannot reach both and External: 1 steps; none Has following equipment at home: Dan Humphreys - 2 wheeled and [employer] - 4 wheeled (uses 2 wheeled downstairs and 4  wheeled upstairs on carpet)  OCCUPATION: retired  LEISURE: watch tv, read  PRIOR LEVEL OF FUNCTION: Independent and living alone until her d/c from [facility] rehab end of July 2024, at which point pt moved in with daughter and son d/t requiring increased level of care.  PATIENT GOALS: "Reduce the swelling in my L arm and my legs and build up my strength."  OBJECTIVE  COGNITION:  Overall cognitive status: Within functional limits for tasks assessed ; 0x4, but does appear slightly forgetful  PALPATION: LUE skin is soft in the upper arm, more taut in the forearm and hand, noting at least 2+ pitting edema in the L forearm, hand, and BLEs.  L forearm is slightly warm to the touch and has a pinkish tint as compared to R unaffected side.  OBSERVATIONS: Pt and daughter pleasant, cooperative, and receptive to initial discussion of care plan.   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  LUE does appear quite heavy, with pt using R hand to cradle the arm at times.   HAND DOMINANCE: Right  UPPER EXTREMITY AROM:  AROM RIGHT   eval   Shoulder extension   Shoulder flexion WFL  Shoulder abduction [facility]  Shoulder internal rotation WNL  Shoulder external rotation WFL    (Blank rows = not tested)  AROM LEFT   eval  Shoulder extension   Shoulder flexion 111*  Shoulder abduction 100*  Shoulder internal rotation WNL  Shoulder external rotation 85* with arm abd at 90*    (Blank rows = not tested)   CERVICAL AROM: NT at eval     Flexion   Extension  Right lateral flexion   Left lateral flexion   Right rotation   Left rotation     UPPER EXTREMITY STRENGTH:  R shoulder flex/abd: 4-; L  shoulder flex 3+ (within available range), L shoulder abd 4- (within available range) R/L elbow flex/ext: 4+;  R/L wrist flex/ext 4+  LOWER EXTREMITY AROM/PROM: NT  LYMPHEDEMA ASSESSMENTS: see circumferential measurements noted below; additional testing to be completed in upcoming sessions as needed  SURGERY TYPE/DATE: L mastectomy 12/23/2017  NUMBER OF LYMPH NODES REMOVED: 6-7 L axillary lymph nodes removed  CHEMOTHERAPY: normally takes a chemo pill daily but currently paused until pt can get her strength back.    RADIATION: 3 rounds of radiation, with last tx being May 31st 2024; radiation targeting L hip and pelvis.  Pt reports 16 total treatments in the pelvic area.  HORMONE TREATMENT: had been taking a hormone replacement pill but recently stopped in the hospital; may restart it, per pt/daughter  INFECTIONS: question for LUE cellulitis and subsequently given ABX x2  -Pt reports she did have a sacral pu from recent hospitalization but cleared up at [facility], and denied infection.  LYMPHEDEMA ASSESSMENTS: circumferential measurements in cm   LANDMARK RIGHT eval  10 cm proximal to olecranon process 31.3 cm  Olecranon process 27.0 cm  10 cm proximal to ulnar styloid process 20.2 cm  Just proximal to ulnar styloid process 15.9 cm  Across hand at thumb web space 20.6 cm  At base of 3rd digit 6.5 cm    LANDMARK LEFT  eval  10 cm proximal to olecranon process 38.0 cm  Olecranon process 31.8 cm  10 cm proximal to ulnar styloid process 26.5 cm  Just proximal to ulnar styloid process 19.8 cm  Across hand at thumb web space 21.5 cm  At base of 3rd digit 6.6 cm    FUNCTIONAL TESTS: none  GAIT:  Distance walked: NT Assistive device utilized:  transport chair, RW Level of assistance: Modified independence short distances inside the home on level ground with RW  PATIENT SURVEYS:  FOTO To complete next session 09/18/22: 56; predicted 67  TODAY'S TREATMENT:                                                                                                                                          DATE: 10/11/22 Therapeutic Activity: BUE volumetrics obtained for additional baseline measures now that pt will be wearing compression garments over the next 1-2 weeks.  Will remeasure in 1-2 weeks to ensure limb volume is maintained with daytime wear of garments and doffing at night time.    Landmark Right 09/18/22  Left 09/18/22 Left 10/11/22  1  MP (distal base of SF) 20.3 (N/A) 20.6 (N/A) 19.8 (N/A)  2  Wrist crease 16.6 18.5 15.8  3 Forearm (*) +4 cm 16.8 20.5 16.0  4 * +4 cm 19.5 23.1 17.5  5 * +4  cm 23.5 27.3 19.7  6 * +4 cm 26.0 29.6 22.3  7 * +4 cm 27.9 31.9 24.2  8  Elbow crease 27.5 31.3 25.5  9 Upper arm (**) +4 cm 29.8 34.3 26.8  10 ** +4 cm 32.5 37.0 29.3  11 ** +4 cm 32.0 36.4 31.2  12 ** +4 cm 33.6 36.0 33.2                  Total Limb Volume: ml 2482.2 ml 3209.0 ml 2096.3 ml    Limb volume differential % L 22.6 % larger than R      Volume change from initial V  L arm reduced by 34.7%     Self Care: Reviewed plan for pt to continue with day time wear of garments, return to bandaging should garments become difficult to don as a result of volume increase.  Plan to hold OT next week and/or until compression glove is obtained.  Pt to continue with wearing schedule for compression sleeve and loaner compression glove.  Reviewed donning/doffing strategies with compression garments, and self MLD technique with good return demo.  Recommended pt order a second compression sleeve to have one to wash, one to wear.   PATIENT EDUCATION:  Education details: Wearing schedule for compression garments, donning strategies, review of self MLD technique Person educated: Patient Education method: Programmer, multimedia, demo Education comprehension: verbalized understanding and needs further education  HOME EXERCISE PROGRAM: LUE A/AAROM, diaphragmatic breathing, self  MLD  ASSESSMENT: CLINICAL IMPRESSION: Beginning transition to maintenance phase of CDT.  Noting LUE limb volume to have been well maintained since use of compression sleeve and glove since last visit, doffing both at night time.  LUE volume has reduced by 34.7% since 09/18/22.  Pt reports that she was able to don her compression sleeve independently with extra time, but states that she wasn't able to pull the garment up as high to her axillary crease as OT was able to.  Pt reports she plans to obtain garden gloves with rubber palm to help with this as recommended by OT.  Pt will continue to utilize loaned glove in the mean time until her new compression glove can be obtained.  Plan to hold OT visits next week and/or until new glove arrives, and will have pt return once new compression glove is obtained to allow time for pt to practice maintenance strategies reviewed today, including following compression garment wearing schedule, performing self MLD, and therapeutic exercises to maintain limb volume.  Pt/family continue to be in agreement with poc.   OBJECTIVE IMPAIRMENTS: decreased activity tolerance, decreased endurance, decreased knowledge of condition, decreased knowledge of use of DME, decreased mobility, decreased ROM, decreased strength, hypomobility, increased edema, increased fascial restrictions, impaired flexibility, impaired sensation, impaired UE functional use, improper body mechanics, postural dysfunction, obesity, and pain.   ACTIVITY LIMITATIONS: carrying, lifting, bending, standing, squatting, sleeping, stairs, transfers, bed mobility, bathing, toileting, dressing, reach over head, hygiene/grooming, and locomotion level  PARTICIPATION LIMITATIONS: meal prep, cleaning, laundry, medication management, driving, shopping, and community activity  PERSONAL FACTORS: Age, Education, Fitness, Past/current experiences, Time since onset of injury/illness/exacerbation, and 3+ comorbidities: stage 4  ca, L mastectomy, lymph node removal/radiation, anxiety, depression, cardiac hx (see chart), obesity  are also affecting patient's functional outcome.   REHAB POTENTIAL: Fair for goals  CLINICAL DECISION MAKING: Unstable/unpredictable  EVALUATION COMPLEXITY: High  GOALS: Goals reviewed with patient? Yes  SHORT TERM GOALS: Target date: 10/25/22  (6 weeks)  Pt will perform HEP with  set up/min vc for promoting lymph flow and LUE flexibility for better engagement of the LUE with daily tasks. Baseline: Eval: HEP not yet initiated Goal status: INITIAL  2.  Pt will demo accurate technique to apply and wear layered compression bandages as instructed on the LUE 23 hours/day with 75% or better consistency in order to reduce LUE limb volume.   Baseline: Eval: Will plan to issue and begin bandaging/educ in the next 1-2 sessions.  Goal status: Initial  3. Pt/caregiver will be indep to verbalize 3-4 lymphedema precautions to prevent exacerbations and/or infections.  Baseline: Eval: educ not yet initiated.  Goal status: Initial   LONG TERM GOALS: Target date: 12/06/22 (12 weeks)  Pt will increase FOTO score to (TBD) or better to indicate improvement in self perceived functional use of the L arm with daily tasks. Baseline: Eval: FOTO to be completed next session (time constraints at eval) Goal status: INITIAL  2. Pt/caregiver will demo LUE MLD techniques with min vc or less and use of visual handout to promote fluid mobilization in the LUE.     Baseline: Eval: Educ not yet initiated  Goal status: Initial  3. Pt to measure 8-10% or more limb volume reduction in the LUE in order to promote healthy skin integrity, reduce infection risk, improve fit of clothing, and ease ability to engage the LUE into daily tasks.  Baseline: Eval: initial circumferential measures indicate LUE to be ~15% larger than RUE, but will plan to measure actual volumetrics in upcoming tx session. Goal status: Initial  4. Pt  to be fitted with LUE compression garment for long-term maintenance of fluid loss in the LUE.  Baseline: Eval: to be fit during last phase of CDT program  Goal status: Initial  PLAN:  Patient presents with mild unilateral left upper extremity lymphedema secondary to surgery.  Lymphedema extends from fingers to the L axilla.  Patient requires a standard fit circular knit compression sleeve and a separate compression glove; quantity of 3 of each for wash and wear.   OT FREQUENCY: 2x/week  OT DURATION: 12 weeks  PLANNED INTERVENTIONS: Therapeutic exercises, Therapeutic activity, Patient/Family education, Self Care, DME instructions, Manual lymph drainage, Compression bandaging, scar mobilization, Taping, Vasopneumatic device, and Manual therapy  PLAN FOR NEXT SESSION: see above  Danelle Earthly, MS, OTR/L, CLT  Otis Dials, OT 10/12/2022, 10:18 AM

## 2022-10-12 NOTE — Progress Notes (Signed)
Specialty Pharmacy Refill Coordination Note  Alicia [name] is a 72 y.o. female contacted today regarding refills of specialty medication(s) Exemestane and Everolimus.  .  Patient requested Delivery  on 10/17/22  to verified address [address] [place]   Pending refill request, Medication will be filled on 10/16/22.

## 2022-10-15 ENCOUNTER — Other Ambulatory Visit (HOSPITAL_COMMUNITY): Payer: Self-pay

## 2022-10-15 ENCOUNTER — Other Ambulatory Visit: Payer: Self-pay | Admitting: Pharmacist

## 2022-10-15 ENCOUNTER — Other Ambulatory Visit: Payer: Self-pay

## 2022-10-15 DIAGNOSIS — C50912 Malignant neoplasm of unspecified site of left female breast: Secondary | ICD-10-CM

## 2022-10-16 ENCOUNTER — Other Ambulatory Visit: Payer: Self-pay

## 2022-10-16 ENCOUNTER — Ambulatory Visit

## 2022-10-16 DIAGNOSIS — J189 Pneumonia, unspecified organism: Secondary | ICD-10-CM | POA: Diagnosis not present

## 2022-10-16 DIAGNOSIS — I719 Aortic aneurysm of unspecified site, without rupture: Secondary | ICD-10-CM | POA: Diagnosis not present

## 2022-10-16 DIAGNOSIS — A419 Sepsis, unspecified organism: Secondary | ICD-10-CM | POA: Diagnosis not present

## 2022-10-16 DIAGNOSIS — G4733 Obstructive sleep apnea (adult) (pediatric): Secondary | ICD-10-CM | POA: Diagnosis not present

## 2022-10-16 DIAGNOSIS — I10 Essential (primary) hypertension: Secondary | ICD-10-CM | POA: Diagnosis not present

## 2022-10-16 DIAGNOSIS — N390 Urinary tract infection, site not specified: Secondary | ICD-10-CM | POA: Diagnosis not present

## 2022-10-17 ENCOUNTER — Other Ambulatory Visit (HOSPITAL_COMMUNITY): Payer: Self-pay

## 2022-10-17 ENCOUNTER — Other Ambulatory Visit: Payer: Self-pay | Admitting: *Deleted

## 2022-10-17 ENCOUNTER — Other Ambulatory Visit: Payer: Self-pay

## 2022-10-17 DIAGNOSIS — A419 Sepsis, unspecified organism: Secondary | ICD-10-CM | POA: Diagnosis not present

## 2022-10-17 DIAGNOSIS — N390 Urinary tract infection, site not specified: Secondary | ICD-10-CM | POA: Diagnosis not present

## 2022-10-17 DIAGNOSIS — G4733 Obstructive sleep apnea (adult) (pediatric): Secondary | ICD-10-CM | POA: Diagnosis not present

## 2022-10-17 DIAGNOSIS — I719 Aortic aneurysm of unspecified site, without rupture: Secondary | ICD-10-CM | POA: Diagnosis not present

## 2022-10-17 DIAGNOSIS — C50912 Malignant neoplasm of unspecified site of left female breast: Secondary | ICD-10-CM

## 2022-10-17 DIAGNOSIS — J189 Pneumonia, unspecified organism: Secondary | ICD-10-CM | POA: Diagnosis not present

## 2022-10-17 DIAGNOSIS — I10 Essential (primary) hypertension: Secondary | ICD-10-CM | POA: Diagnosis not present

## 2022-10-17 MED ORDER — EVEROLIMUS 5 MG PO TABS
5.0000 mg | ORAL_TABLET | Freq: Every day | ORAL | 0 refills | Status: DC
  Filled 2022-10-17: qty 30, 30d supply, fill #0

## 2022-10-17 NOTE — Progress Notes (Signed)
Left voice mail for patient, everolimus 5mg  renew today, medication will be ship 10/18/22 for 10/19/22 delivery.

## 2022-10-18 ENCOUNTER — Inpatient Hospital Stay: Payer: Medicare Other

## 2022-10-18 ENCOUNTER — Inpatient Hospital Stay: Payer: Medicare Other | Admitting: Pharmacist

## 2022-10-18 ENCOUNTER — Inpatient Hospital Stay: Payer: Medicare Other | Attending: Oncology

## 2022-10-18 ENCOUNTER — Other Ambulatory Visit: Payer: Self-pay

## 2022-10-18 ENCOUNTER — Inpatient Hospital Stay (HOSPITAL_BASED_OUTPATIENT_CLINIC_OR_DEPARTMENT_OTHER): Payer: Medicare Other | Admitting: Oncology

## 2022-10-18 ENCOUNTER — Encounter: Payer: Self-pay | Admitting: Oncology

## 2022-10-18 ENCOUNTER — Ambulatory Visit

## 2022-10-18 ENCOUNTER — Other Ambulatory Visit: Payer: Self-pay | Admitting: Pharmacist

## 2022-10-18 VITALS — BP 113/59 | HR 79 | Temp 97.6°F | Resp 16 | Ht 66.0 in | Wt 187.0 lb

## 2022-10-18 DIAGNOSIS — Z79899 Other long term (current) drug therapy: Secondary | ICD-10-CM | POA: Diagnosis not present

## 2022-10-18 DIAGNOSIS — C50912 Malignant neoplasm of unspecified site of left female breast: Secondary | ICD-10-CM | POA: Insufficient documentation

## 2022-10-18 DIAGNOSIS — F419 Anxiety disorder, unspecified: Secondary | ICD-10-CM | POA: Insufficient documentation

## 2022-10-18 DIAGNOSIS — G629 Polyneuropathy, unspecified: Secondary | ICD-10-CM | POA: Insufficient documentation

## 2022-10-18 DIAGNOSIS — I890 Lymphedema, not elsewhere classified: Secondary | ICD-10-CM | POA: Diagnosis not present

## 2022-10-18 DIAGNOSIS — Z170 Estrogen receptor positive status [ER+]: Secondary | ICD-10-CM | POA: Diagnosis not present

## 2022-10-18 DIAGNOSIS — C7951 Secondary malignant neoplasm of bone: Secondary | ICD-10-CM | POA: Diagnosis not present

## 2022-10-18 DIAGNOSIS — Z87891 Personal history of nicotine dependence: Secondary | ICD-10-CM | POA: Diagnosis not present

## 2022-10-18 DIAGNOSIS — D649 Anemia, unspecified: Secondary | ICD-10-CM | POA: Diagnosis not present

## 2022-10-18 LAB — CBC WITH DIFFERENTIAL/PLATELET
Abs Immature Granulocytes: 0.02 10*3/uL (ref 0.00–0.07)
Basophils Absolute: 0 10*3/uL (ref 0.0–0.1)
Basophils Relative: 0 %
Eosinophils Absolute: 0.2 10*3/uL (ref 0.0–0.5)
Eosinophils Relative: 3 %
HCT: 33 % — ABNORMAL LOW (ref 36.0–46.0)
Hemoglobin: 10.5 g/dL — ABNORMAL LOW (ref 12.0–15.0)
Immature Granulocytes: 0 %
Lymphocytes Relative: 6 %
Lymphs Abs: 0.3 10*3/uL — ABNORMAL LOW (ref 0.7–4.0)
MCH: 28.5 pg (ref 26.0–34.0)
MCHC: 31.8 g/dL (ref 30.0–36.0)
MCV: 89.7 fL (ref 80.0–100.0)
Monocytes Absolute: 0.6 10*3/uL (ref 0.1–1.0)
Monocytes Relative: 12 %
Neutro Abs: 3.7 10*3/uL (ref 1.7–7.7)
Neutrophils Relative %: 79 %
Platelets: 241 10*3/uL (ref 150–400)
RBC: 3.68 MIL/uL — ABNORMAL LOW (ref 3.87–5.11)
RDW: 15.8 % — ABNORMAL HIGH (ref 11.5–15.5)
WBC: 4.7 10*3/uL (ref 4.0–10.5)
nRBC: 0 % (ref 0.0–0.2)

## 2022-10-18 LAB — CMP (CANCER CENTER ONLY)
ALT: 17 U/L (ref 0–44)
AST: 26 U/L (ref 15–41)
Albumin: 3.7 g/dL (ref 3.5–5.0)
Alkaline Phosphatase: 96 U/L (ref 38–126)
Anion gap: 9 (ref 5–15)
BUN: 19 mg/dL (ref 8–23)
CO2: 25 mmol/L (ref 22–32)
Calcium: 9.3 mg/dL (ref 8.9–10.3)
Chloride: 101 mmol/L (ref 98–111)
Creatinine: 1.17 mg/dL — ABNORMAL HIGH (ref 0.44–1.00)
GFR, Estimated: 49 mL/min — ABNORMAL LOW (ref >60)
Glucose, Bld: 115 mg/dL — ABNORMAL HIGH (ref 70–99)
Potassium: 4 mmol/L (ref 3.5–5.1)
Sodium: 135 mmol/L (ref 135–145)
Total Bilirubin: 0.5 mg/dL (ref 0.3–1.2)
Total Protein: 6.9 g/dL (ref 6.5–8.1)

## 2022-10-18 MED ORDER — ONDANSETRON HCL 8 MG PO TABS: 8.0000 mg | ORAL_TABLET | Freq: Three times a day (TID) | ORAL | 1 refills | Status: DC | PRN

## 2022-10-18 MED ORDER — TRAMADOL HCL 50 MG PO TABS: 50.0000 mg | ORAL_TABLET | Freq: Four times a day (QID) | ORAL | 0 refills | Status: DC | PRN

## 2022-10-18 MED ORDER — ONDANSETRON HCL 8 MG PO TABS
8.0000 mg | ORAL_TABLET | Freq: Three times a day (TID) | ORAL | 2 refills | Status: DC | PRN
Start: 2022-10-18 — End: 2023-01-13

## 2022-10-18 MED ORDER — PROCHLORPERAZINE MALEATE 10 MG PO TABS
10.0000 mg | ORAL_TABLET | Freq: Four times a day (QID) | ORAL | 2 refills | Status: DC | PRN
Start: 2022-10-18 — End: 2023-01-10

## 2022-10-18 NOTE — Progress Notes (Signed)
Needs rx for new sleeve and glove for lymphedema sent to [facility].

## 2022-10-18 NOTE — Progress Notes (Signed)
Oral Chemotherapy Clinic [facility]  Telephone:([phone] Fax:([phone]  [name], [name], [name], [name] Jim Like, RN as Registered Nurse Orlie Dakin Tollie Pizza, MD as Consulting Physician (Oncology) Carmina Miller, MD as Referring Physician (Radiation Oncology) Chriss Driver, RN as Registered Nurse   Name of the patient: Alicia [name]  [id number]  1950-07-24   Date of visit: 10/18/22  HPI: Patient is a 72 y.o. female with metastatic ER+ HER2- breast cancer. Previously treated with palbociclib, but treatment was changed to Afinitor (everolimus) and exemestane due to disease progression 03/2022. Everolimus 10 mg held on 05/22/22 due to potential drug rash, patient started on steroid taper. Rash cleared and patient resumed everolimus 10 mg on 05/31/22. Patient called to report rash return on 06/04/22. Treatment was again held and prescription for hydrocortisone 2.5% cream sent in. Patient resumed everolimus 7.5mg  on 06/18/22 along with loratidine. Patient was admitted to [facility] hospital for altered mental status, dehydration, and weakness on 07/18/22, everolimus and exemestane were held with admission and at discharge. Patient resumed everolimus (at 5mg )  and exemestane on 09/20/22. Everolimus was again held on 10/05/22 due to weakness and fatigue, everolimus 5mg  was resumed on 10/08/22.  Reason for Consult: Oral chemotherapy follow-up for everolimus and exemestane therapy.   PAST MEDICAL HISTORY: Past Medical History:  Diagnosis Date   Anxiety    Breast cancer, left (HCC) 07/2017   Depression    Hypertension    Hypothyroidism    Personal history of chemotherapy 2019   LEFT mastectomy-chemo before   Personal history of radiation therapy 03/2018   LEFT mastectomy   Rapid heart rate    Thyroid disease     HEMATOLOGY/ONCOLOGY HISTORY:  Oncology History Overview  Note  Patient is a 72 year old female who recently self palpated a mass on her left breast.  Subsequent imaging and biopsy revealed the above-stated breast cancer.  Case was also discussed extensively at case conference.  Given the size and the stage of patient's malignancy, she will benefit from neoadjuvant chemotherapy using Adriamycin, Cytoxan, and Taxol.  Patient will also require Neulasta support.  Will get CT scan of the chest, abdomen, and pelvis to assess for any metastatic disease.  Patient will also require port placement and MUGA prior to initiating treatment  CT abdomen/pelvis/chest did not reveal any suspicious lesions concerning for metastatic disease. (08/01/17)  Port-A-Cath placed on 08/07/2017.  Cycle 1 day 1 of AC was given on 08/08/17.     Breast cancer, stage 2, left (HCC)  07/24/2017 Initial Diagnosis   Breast cancer, stage 2, left (HCC)   07/30/2017 Cancer Staging   Staging form: Breast, AJCC 8th Edition - Clinical stage from 07/30/2017: Stage IIA (cT2, cN1, cM0, G2, ER+, PR+, HER2-) - Signed by Jeralyn Ruths, MD on 07/30/2017   08/08/2017 - 10/31/2017 Chemotherapy   The patient had DOXOrubicin (ADRIAMYCIN) chemo injection 130 mg, 60 mg/m2 = 130 mg, Intravenous,  Once, 4 of 4 cycles Administration: 130 mg (08/08/2017), 130 mg (08/22/2017), 130 mg (09/05/2017), 130 mg (09/19/2017) palonosetron (ALOXI) injection 0.25 mg, 0.25 mg, Intravenous,  Once, 4 of 4 cycles Administration: 0.25 mg (08/08/2017), 0.25 mg (08/22/2017), 0.25 mg (09/05/2017), 0.25 mg (09/19/2017) pegfilgrastim-cbqv (UDENYCA) injection 6 mg, 6 mg, Subcutaneous, Once, 4 of 4 cycles Administration: 6 mg (08/09/2017), 6 mg (08/23/2017), 6 mg (09/06/2017), 6 mg (09/20/2017) cyclophosphamide (CYTOXAN) 1,300 mg in sodium chloride 0.9 % 250 mL chemo infusion,  600 mg/m2 = 1,300 mg, Intravenous,  Once, 4 of 4 cycles Administration: 1,300 mg (08/08/2017), 1,300 mg (08/22/2017), 1,300 mg (09/05/2017), 1,300 mg (09/19/2017) PACLitaxel  (TAXOL) 174 mg in sodium chloride 0.9 % 250 mL chemo infusion (</= 80mg /m2), 80 mg/m2 = 174 mg, Intravenous,  Once, 4 of 12 cycles Dose modification: 72 mg/m2 (original dose 80 mg/m2, Cycle 6, Reason: Dose not tolerated) Administration: 174 mg (10/03/2017), 156 mg (10/17/2017), 156 mg (10/24/2017), 156 mg (10/31/2017) fosaprepitant (EMEND) 150 mg, dexamethasone (DECADRON) 12 mg in sodium chloride 0.9 % 145 mL IVPB, , Intravenous,  Once, 4 of 4 cycles Administration:  (08/08/2017),  (08/22/2017),  (09/05/2017),  (09/19/2017)  for chemotherapy treatment.      ALLERGIES:  is allergic to sulfa antibiotics.  MEDICATIONS:  Current Outpatient Medications  Medication Sig Dispense Refill   ondansetron (ZOFRAN) 8 MG tablet Take 1 tablet (8 mg total) by mouth every 8 (eight) hours as needed for nausea or vomiting. 20 tablet 2   prochlorperazine (COMPAZINE) 10 MG tablet Take 1 tablet (10 mg total) by mouth every 6 (six) hours as needed for nausea or vomiting. 30 tablet 2   acetaminophen (TYLENOL) 500 MG tablet Take 1,000 mg by mouth every 6 (six) hours as needed for moderate pain or headache.      ALPRAZolam (XANAX) 0.5 MG tablet Take 1 tablet (0.5 mg total) by mouth 2 (two) times daily as needed for anxiety. 10 tablet 0   Amino Acids-Protein Hydrolys (PRO-STAT SUGAR FREE PO) Take 30 mLs by mouth 2 (two) times daily.     amLODipine (NORVASC) 5 MG tablet Take 1 tablet (5 mg total) by mouth daily. 90 tablet 3   ascorbic acid (VITAMIN C) 500 MG tablet Take 500 mg by mouth daily.     aspirin EC 81 MG tablet Take 81 mg by mouth at bedtime.      calcium carbonate (OS-CAL - DOSED IN MG OF ELEMENTAL CALCIUM) 1250 (500 Ca) MG tablet Take 1 tablet by mouth daily with breakfast.     Cholecalciferol (VITAMIN D3) 125 MCG (5000 UT) CAPS Take 5,000 Units by mouth 2 (two) times a day.     everolimus (AFINITOR) 5 MG tablet Take 1 tablet (5 mg total) by mouth daily. 30 tablet 0   exemestane (AROMASIN) 25 MG tablet Take 1 tablet  (25 mg total) by mouth daily after breakfast. 30 tablet 2   hydrochlorothiazide (HYDRODIURIL) 12.5 MG tablet Take 12.5 mg by mouth daily. (Patient not taking: Reported on 10/18/2022)     levothyroxine (SYNTHROID) 100 MCG tablet Take 1 tablet (100 mcg total) by mouth daily before breakfast.     linaclotide (LINZESS) 145 MCG CAPS capsule Take 1 capsule (145 mcg total) by mouth daily before breakfast. 30 capsule 1   loratadine (CLARITIN) 10 MG tablet Take 10 mg by mouth daily. Take to reduce risk of rash from everolimus.     MAGNESIUM CITRATE PO Take by mouth.     metoprolol tartrate (LOPRESSOR) 25 MG tablet Take 0.5 tablets (12.5 mg total) by mouth 2 (two) times daily. (Patient not taking: Reported on 09/28/2022)     Multiple Vitamin (MULTIVITAMIN WITH MINERALS) TABS tablet Take 1 tablet by mouth daily.     ondansetron (ZOFRAN) 8 MG tablet Take 1 tablet (8 mg total) by mouth every 8 (eight) hours as needed. 30 tablet 1   pantoprazole (PROTONIX) 20 MG tablet TAKE 1 TABLET(20 MG) BY MOUTH DAILY 90 tablet 0   potassium chloride SA (KLOR-CON M)  20 MEQ tablet TAKE 1 TABLET BY MOUTH TWICE DAILY (Patient taking differently: Take 20 mEq by mouth 2 (two) times daily.) 60 tablet 3   Probiotic Product (ALIGN) 4 MG CAPS Take 1 capsule by mouth daily.     rosuvastatin (CRESTOR) 5 MG tablet Take 1 tablet (5 mg total) by mouth daily. 90 tablet 3   Sennosides-Docusate Sodium (SENNA PLUS PO) Take 1 capsule by mouth as needed (constipation).     traMADol (ULTRAM) 50 MG tablet Take 1 tablet (50 mg total) by mouth every 6 (six) hours as needed. 60 tablet 0   traZODone (DESYREL) 50 MG tablet Take 1 tablet (50 mg total) by mouth at bedtime. (Patient not taking: Reported on 09/28/2022)     traZODone (DESYREL) 50 MG tablet Take 1 tablet (50 mg total) by mouth at bedtime. 30 tablet 2   No current facility-administered medications for this visit.    VITAL SIGNS: There were no vitals taken for this visit. There were no  vitals filed for this visit.  Estimated body mass index is 30.18 kg/m as calculated from the following:   Height as of an earlier encounter on 10/18/22: 5\' 6"  (1.676 m).   Weight as of an earlier encounter on 10/18/22: 84.8 kg (187 lb).  LABS: CBC:    Component Value Date/Time   WBC 4.7 10/18/2022 0935   HGB 10.5 (L) 10/18/2022 0935   HGB 10.5 (L) 10/08/2022 0902   HGB 14.2 02/11/2011 1349   HCT 33.0 (L) 10/18/2022 0935   HCT 40.9 02/11/2011 1349   PLT 241 10/18/2022 0935   PLT 249 10/08/2022 0902   PLT 151 02/11/2011 1349   MCV 89.7 10/18/2022 0935   MCV 89 02/11/2011 1349   NEUTROABS 3.7 10/18/2022 0935   LYMPHSABS 0.3 (L) 10/18/2022 0935   MONOABS 0.6 10/18/2022 0935   EOSABS 0.2 10/18/2022 0935   BASOSABS 0.0 10/18/2022 0935   Comprehensive Metabolic Panel:    Component Value Date/Time   NA 135 10/18/2022 0935   NA 142 02/11/2011 1349   K 4.0 10/18/2022 0935   K 3.8 02/11/2011 1349   CL 101 10/18/2022 0935   CL 107 02/11/2011 1349   CO2 25 10/18/2022 0935   CO2 24 02/11/2011 1349   BUN 19 10/18/2022 0935   BUN 14 02/11/2011 1349   CREATININE 1.17 (H) 10/18/2022 0935   CREATININE 0.65 02/11/2011 1349   GLUCOSE 115 (H) 10/18/2022 0935   GLUCOSE 98 02/11/2011 1349   CALCIUM 9.3 10/18/2022 0935   CALCIUM 9.0 02/11/2011 1349   AST 26 10/18/2022 0935   ALT 17 10/18/2022 0935   ALT 36 02/11/2011 1349   ALKPHOS 96 10/18/2022 0935   ALKPHOS 122 02/11/2011 1349   BILITOT 0.5 10/18/2022 0935   PROT 6.9 10/18/2022 0935   PROT 7.0 02/11/2011 1349   ALBUMIN 3.7 10/18/2022 0935   ALBUMIN 4.1 02/11/2011 1349     Present during today's visit: Patient and daughter-in-law  Assessment and Plan: CBC/CMP reviewed and patient is feeling improved, continue everolimus (at 5mg )  and exemestane Patient continues to live with her son. Her movement around the house is improving with physical therapy.   Oral Chemotherapy Side Effect/Intolerance:  Nausea: patient reports  occasional nausea, currently using ondansetron prn. Rx sent for prochlorperazine so patient can alternate therapy if needed. Constipation: manageable  Oral Chemotherapy Adherence: No reported missed doses. No patient barriers to medication adherence identified.   New medications: none reported  Medication Access Issues: no issues, pt  fills at [facility])  Patient expressed understanding and was in agreement with this plan. She also understands that She can call clinic at any time with any questions, concerns, or complaints.   Follow-up plan: RTC in 4 weeks  Thank you for allowing me to participate in the care of this very pleasant patient.   Time Total: 15 mins  Visit consisted of counseling and education on dealing with issues of symptom management in the setting of serious and potentially life-threatening illness.Greater than 50%  of this time was spent counseling and coordinating care related to the above assessment and plan.  Signed by: Remi Haggard, PharmD, BCPS, BCOP, CPP Hematology/Oncology Clinical Pharmacist Practitioner [place]/DB/AP Oral Chemotherapy Navigation [facility] [phone]

## 2022-10-18 NOTE — Progress Notes (Signed)
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  Patient receiving everolimus and exemstane.  Met with patient in clinic.  Patient reports good appetite.  Has been eating 3 meals a day.  Bowels are moving.  Continues on fluid restriction per cardiology.  Seeing OT for lymphedema.    Medications: reviewed  Labs: reviewed  Anthropometrics:   Weight 187 lb (fluid loss, recently taken off fluid pill) 196 lb on 9/3    NUTRITION DIAGNOSIS: Inadequate oral intake improved   INTERVENTION:  Encouraged well balanced diet with lean protein  Patient to contact RD if needed in the future    MONITORING, EVALUATION, GOAL: weight trends, intake   NEXT VISIT: as needed   Alicia [name] B. Freida Busman, RD, LDN Registered Dietitian [phone]

## 2022-10-18 NOTE — Progress Notes (Signed)
[facility]  Telephone:([phone] Fax:([phone]  ID: Alicia [name] OB: February 06, 1950  MR#: [id number]  WUX#:[id number]  [name], [name], [name], [name] Jim Like, RN as Registered Nurse Jeralyn Ruths, MD as Consulting Physician (Oncology) Carmina Miller, MD as Referring Physician (Radiation Oncology) Chriss Driver, RN as Registered Nurse   CHIEF COMPLAINT: Recurrent stage IV ER/PR positive, 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  INTERVAL HISTORY: Patient returns to clinic today for further evaluation and continuation of Afinitor and Aromasin.  Her performance status has significantly improved and she feels nearly back to her baseline.  She continues to have left arm lymphedema, but otherwise feels well. Her jaw pain has resolved.  She continues to have a mild peripheral neuropathy, but no other neurologic complaints.  She denies any chest pain, shortness of breath, cough, or hemoptysis.  She denies any nausea, vomiting, or constipation.  She has no urinary complaints.  Patient offers no further specific complaints today.  REVIEW OF SYSTEMS:   Review of Systems  Constitutional: Negative.  Negative for fever, malaise/fatigue and weight loss.  HENT:  Negative for congestion.   Respiratory: Negative.  Negative for cough and shortness of breath.   Cardiovascular: Negative.  Negative for chest pain and leg swelling.  Gastrointestinal: Negative.  Negative for abdominal pain, constipation, diarrhea and nausea.  Genitourinary: Negative.  Negative for dysuria, flank pain and urgency.  Musculoskeletal: Negative.  Negative for back pain and joint pain.  Skin: Negative.  Negative for rash.  Neurological:  Positive for tingling and sensory change. Negative for dizziness, focal weakness, weakness and headaches.  Psychiatric/Behavioral:  The  patient is not nervous/anxious.     As per HPI. Otherwise, a complete review of systems is negative.  PAST MEDICAL HISTORY: Past Medical History:  Diagnosis Date   Anxiety    Breast cancer, left (HCC) 07/2017   Depression    Hypertension    Hypothyroidism    Personal history of chemotherapy 2019   LEFT mastectomy-chemo before   Personal history of radiation therapy 03/2018   LEFT mastectomy   Rapid heart rate    Thyroid disease     PAST SURGICAL HISTORY: Past Surgical History:  Procedure Laterality Date   AXILLARY LYMPH NODE BIOPSY Left 07/16/2017   METASTATIC MAMMARY CARCINOMA   BREAST BIOPSY Left 07/16/2017   [place] bx of left breast mass and left breast LN.  INVASIVE MAMMARY CARCINOMA, NO SPECIAL TYPE.    BREAST EXCISIONAL BIOPSY Right 2001   benign   BREAST LUMPECTOMY WITH SENTINEL LYMPH NODE BIOPSY Left 12/06/2017   Procedure: BREAST LUMPECTOMY WITH SENTINEL LYMPH NODE BX;  Surgeon: Earline Mayotte, MD;  Location: [facility] ORS;  Service: General;  Laterality: Left;   COLONOSCOPY     MASTECTOMY Left 12/23/2017   PORTACATH PLACEMENT Right 08/07/2017   Procedure: INSERTION PORT-A-CATH;  Surgeon: Earline Mayotte, MD;  Location: [facility] ORS;  Service: General;  Laterality: Right;   SIMPLE MASTECTOMY WITH AXILLARY SENTINEL NODE BIOPSY Left 12/23/2017   T2,N2 with 6/7 nodes positive. Whole breast radiation.  Surgeon: Earline Mayotte, MD;  Location: [facility] ORS;  Service: General;  Laterality: Left;   TONSILLECTOMY      FAMILY HISTORY: Family History  Problem Relation Age of Onset   Stroke Mother    Thyroid disease Mother    Renal Disease Mother    Stroke Father    Heart  attack Father    Sudden death Father 35       suicide   Anuerysm Brother    Breast cancer Neg Hx     ADVANCED DIRECTIVES (Y/N):  N  HEALTH MAINTENANCE: Social History   Tobacco Use   Smoking status: Former    Current packs/day: 0.00    Average packs/day: 1 pack/day for 30.0 years (30.0 ttl pk-yrs)     Types: Cigarettes    Start date: 57    Quit date: 2019    Years since quitting: 5.7   Smokeless tobacco: Never  Vaping Use   Vaping status: Never Used  Substance Use Topics   Alcohol use: Not Currently   Drug use: Never     Colonoscopy:  PAP:  Bone density:  Lipid panel:  Allergies  Allergen Reactions   Sulfa Antibiotics Diarrhea    Current Outpatient Medications  Medication Sig Dispense Refill   acetaminophen (TYLENOL) 500 MG tablet Take 1,000 mg by mouth every 6 (six) hours as needed for moderate pain or headache.      ALPRAZolam (XANAX) 0.5 MG tablet Take 1 tablet (0.5 mg total) by mouth 2 (two) times daily as needed for anxiety. 10 tablet 0   Amino Acids-Protein Hydrolys (PRO-STAT SUGAR FREE PO) Take 30 mLs by mouth 2 (two) times daily.     amLODipine (NORVASC) 5 MG tablet Take 1 tablet (5 mg total) by mouth daily. 90 tablet 3   ascorbic acid (VITAMIN C) 500 MG tablet Take 500 mg by mouth daily.     aspirin EC 81 MG tablet Take 81 mg by mouth at bedtime.      calcium carbonate (OS-CAL - DOSED IN MG OF ELEMENTAL CALCIUM) 1250 (500 Ca) MG tablet Take 1 tablet by mouth daily with breakfast.     Cholecalciferol (VITAMIN D3) 125 MCG (5000 UT) CAPS Take 5,000 Units by mouth 2 (two) times a day.     everolimus (AFINITOR) 5 MG tablet Take 1 tablet (5 mg total) by mouth daily. 30 tablet 0   exemestane (AROMASIN) 25 MG tablet Take 1 tablet (25 mg total) by mouth daily after breakfast. 30 tablet 2   levothyroxine (SYNTHROID) 100 MCG tablet Take 1 tablet (100 mcg total) by mouth daily before breakfast.     linaclotide (LINZESS) 145 MCG CAPS capsule Take 1 capsule (145 mcg total) by mouth daily before breakfast. 30 capsule 1   loratadine (CLARITIN) 10 MG tablet Take 10 mg by mouth daily. Take to reduce risk of rash from everolimus.     MAGNESIUM CITRATE PO Take by mouth.     Multiple Vitamin (MULTIVITAMIN WITH MINERALS) TABS tablet Take 1 tablet by mouth daily.     pantoprazole  (PROTONIX) 20 MG tablet TAKE 1 TABLET(20 MG) BY MOUTH DAILY 90 tablet 0   potassium chloride SA (KLOR-CON M) 20 MEQ tablet TAKE 1 TABLET BY MOUTH TWICE DAILY (Patient taking differently: Take 20 mEq by mouth 2 (two) times daily.) 60 tablet 3   Probiotic Product (ALIGN) 4 MG CAPS Take 1 capsule by mouth daily.     rosuvastatin (CRESTOR) 5 MG tablet Take 1 tablet (5 mg total) by mouth daily. 90 tablet 3   Sennosides-Docusate Sodium (SENNA PLUS PO) Take 1 capsule by mouth as needed (constipation).     traMADol (ULTRAM) 50 MG tablet Take 1 tablet (50 mg total) by mouth every 6 (six) hours as needed. 60 tablet 0   traZODone (DESYREL) 50 MG tablet Take 1 tablet (50  mg total) by mouth at bedtime. 30 tablet 2   hydrochlorothiazide (HYDRODIURIL) 12.5 MG tablet Take 12.5 mg by mouth daily. (Patient not taking: Reported on 10/18/2022)     metoprolol tartrate (LOPRESSOR) 25 MG tablet Take 0.5 tablets (12.5 mg total) by mouth 2 (two) times daily. (Patient not taking: Reported on 09/28/2022)     ondansetron (ZOFRAN) 8 MG tablet Take 1 tablet (8 mg total) by mouth every 8 (eight) hours as needed for nausea or vomiting. 20 tablet 2   prochlorperazine (COMPAZINE) 10 MG tablet Take 1 tablet (10 mg total) by mouth every 6 (six) hours as needed for nausea or vomiting. 30 tablet 2   traZODone (DESYREL) 50 MG tablet Take 1 tablet (50 mg total) by mouth at bedtime. (Patient not taking: Reported on 09/28/2022)     No current facility-administered medications for this visit.    OBJECTIVE: Vitals:   10/18/22 0945  BP: (!) 113/59  Pulse: 79  Resp: 16  Temp: 97.6 F (36.4 C)  SpO2: 98%     Body mass index is 30.18 kg/m.    ECOG FS:1 - Symptomatic but completely ambulatory  General: Well-developed, well-nourished, no acute distress.  Sitting in wheelchair. Eyes: Pink conjunctiva, anicteric sclera. HEENT: Normocephalic, moist mucous membranes. Lungs: No audible wheezing or coughing. Heart: Regular rate and  rhythm. Abdomen: Soft, nontender, no obvious distention. Musculoskeletal: No edema, cyanosis, or clubbing. Neuro: Alert, answering all questions appropriately. Cranial nerves grossly intact. Skin: No rashes or petechiae noted. Psych: Normal affect.  LAB RESULTS:  Lab Results  Component Value Date   NA 135 10/18/2022   K 4.0 10/18/2022   CL 101 10/18/2022   CO2 25 10/18/2022   GLUCOSE 115 (H) 10/18/2022   BUN 19 10/18/2022   CREATININE 1.17 (H) 10/18/2022   CALCIUM 9.3 10/18/2022   PROT 6.9 10/18/2022   ALBUMIN 3.7 10/18/2022   AST 26 10/18/2022   ALT 17 10/18/2022   ALKPHOS 96 10/18/2022   BILITOT 0.5 10/18/2022   GFRNONAA 49 (L) 10/18/2022   GFRAA 57 (L) 10/01/2019    Lab Results  Component Value Date   WBC 4.7 10/18/2022   NEUTROABS 3.7 10/18/2022   HGB 10.5 (L) 10/18/2022   HCT 33.0 (L) 10/18/2022   MCV 89.7 10/18/2022   PLT 241 10/18/2022     STUDIES: No results found.   ONCOLOGY HISTORY: Patient initially received neoadjuvant chemotherapy with Adriamycin and Cytoxan followed by weekly Taxol. She only received 4 cycles of weekly Taxol prior to discontinuation of treatment on October 31, 2017 secondary to persistent peripheral neuropathy.  She ultimately required mastectomy and final pathology noted 5 of 6 lymph nodes positive for disease.  Patient completed adjuvant XRT in mid April 2020.  Pain initiated letrozole in May 2020, this was discontinued secondary to side effects and patient was started on anastrozole.  Nuclear medicine bone scan on June 19, 2019 revealed metastatic disease and anastrozole was subsequently discontinued.  PET scan results from October 02, 2021 revealed no clear evidence of metastatic progression.  PET scan results from June 30, 2019 reviewed independently with metastatic bony disease, but no obvious evidence of visceral disease.  MRI of the brain on August 24, 2019 reviewed independently with no obvious evidence of metastatic disease.    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  Patient's CA 27-29  is essentially stable at 109.4.  Her most recent imaging with CT scan on July 24, 2022 reviewed independently with essentially stable osseous disease and no evidence of visceral disease.  She was reinitiated with dose reduced Afinitor 5 mg daily along with Aromasin 25 mg daily.  [name] has been permanently discontinued given her issues with her mandible.  Can consider increasing dose of Afinitor in the next 1 to 2 months.  No further intervention is needed.  Return to clinic in 4 weeks with repeat laboratory, further evaluation, and continuation of treatment.  Appreciate clinical pharmacy input.   Constipation: Resolved. Peripheral neuropathy: Chronic and unchanged. History of pulmonary embolus: Patient was diagnosed with a small pulmonary embolus on January 10, 2018.  She is no longer on anticoagulation.  There was no evidence of recurrent PE on CT scan from October 27, 2019. Osteopenia: Patient's most recent bone mineral density on March 03, 2019 reported a T score of -1.8 which is only mildly decreased from 1 year prior when the reported T score was -1.6.  Continue calcium and vitamin D supplementation.  Patient no longer will receive bisphosphonates.   Left hip/flank pain: Patient does not complain of pain today.  Continue tramadol as prescribed.  She has now completed XRT.  Patient was given a refill of tramadol today. Anemia: Hemoglobin mildly improved to 10.5, monitor. Renal insufficiency: Mild.  Patient's creatinine is 1.17 today. Hypokalemia: Resolved.  Continue oral potassium supplementation. Dental pathology: Significantly improved.  Continue follow up with Dr. Willaim Bane.  Patient has discontinued hyperbaric treatments at [facility]. Lymphedema: Follow-up with occupational therapy as scheduled. Coping/anxiety: Continue trazodone at night.  Patient was also given a referral to [facility].   Patient expressed understanding and was in agreement with this plan. She also understands that She can call clinic at any time with any questions, concerns, or complaints.    Cancer Staging  Breast cancer, stage 2, left (HCC) Staging form: Breast, AJCC 8th Edition - Clinical stage from 07/30/2017: Stage IIA (cT2, cN1, cM0, G2, ER+, PR+, HER2-) - Signed by Jeralyn Ruths, MD on 07/30/2017 Histologic grading system: 3 grade system Laterality: Left   Jeralyn Ruths, MD   10/18/2022 11:17 AM

## 2022-10-19 ENCOUNTER — Ambulatory Visit: Payer: Medicare Other | Attending: Cardiology

## 2022-10-19 DIAGNOSIS — R609 Edema, unspecified: Secondary | ICD-10-CM | POA: Diagnosis not present

## 2022-10-19 DIAGNOSIS — I351 Nonrheumatic aortic (valve) insufficiency: Secondary | ICD-10-CM | POA: Insufficient documentation

## 2022-10-19 DIAGNOSIS — I471 Supraventricular tachycardia, unspecified: Secondary | ICD-10-CM | POA: Insufficient documentation

## 2022-10-19 DIAGNOSIS — R0602 Shortness of breath: Secondary | ICD-10-CM | POA: Insufficient documentation

## 2022-10-19 LAB — ECHOCARDIOGRAM COMPLETE
AR max vel: 2.84 cm2
AV Area VTI: 2.78 cm2
AV Area mean vel: 2.76 cm2
AV Mean grad: 6 mm[Hg]
AV Peak grad: 10.8 mm[Hg]
Ao pk vel: 1.65 m/s
Area-P 1/2: 3.91 cm2
Calc EF: 54.3 %
P 1/2 time: 504 ms
S' Lateral: 3.6 cm
Single Plane A2C EF: 56.2 %
Single Plane A4C EF: 54.4 %

## 2022-10-19 LAB — CANCER ANTIGEN 27.29: CA 27.29: 105.4 U/mL — ABNORMAL HIGH (ref 0.0–38.6)

## 2022-10-23 ENCOUNTER — Ambulatory Visit: Payer: Medicare Other | Attending: Cardiology | Admitting: Cardiology

## 2022-10-23 ENCOUNTER — Ambulatory Visit

## 2022-10-23 ENCOUNTER — Encounter: Payer: Self-pay | Admitting: Cardiology

## 2022-10-23 VITALS — BP 138/74 | HR 83 | Ht 66.0 in | Wt 187.0 lb

## 2022-10-23 DIAGNOSIS — C50919 Malignant neoplasm of unspecified site of unspecified female breast: Secondary | ICD-10-CM | POA: Diagnosis not present

## 2022-10-23 DIAGNOSIS — I890 Lymphedema, not elsewhere classified: Secondary | ICD-10-CM | POA: Diagnosis not present

## 2022-10-23 DIAGNOSIS — R0602 Shortness of breath: Secondary | ICD-10-CM | POA: Diagnosis not present

## 2022-10-23 DIAGNOSIS — C7951 Secondary malignant neoplasm of bone: Secondary | ICD-10-CM | POA: Insufficient documentation

## 2022-10-23 DIAGNOSIS — I10 Essential (primary) hypertension: Secondary | ICD-10-CM | POA: Diagnosis not present

## 2022-10-23 DIAGNOSIS — I700 Atherosclerosis of aorta: Secondary | ICD-10-CM

## 2022-10-23 DIAGNOSIS — R002 Palpitations: Secondary | ICD-10-CM

## 2022-10-23 DIAGNOSIS — I471 Supraventricular tachycardia, unspecified: Secondary | ICD-10-CM

## 2022-10-23 MED ORDER — POTASSIUM CHLORIDE CRYS ER 20 MEQ PO TBCR: 20.0000 meq | EXTENDED_RELEASE_TABLET | Freq: Two times a day (BID) | ORAL | 3 refills | Status: DC

## 2022-10-23 NOTE — Progress Notes (Signed)
Will discuss at follow up appointment

## 2022-10-23 NOTE — Progress Notes (Signed)
Cardiology Office Note:  .   Date:  10/23/2022  ID:  [name], DOB 04-03-1950, MRN [id number] PCP: Corky Downs, MD  [facility] Providers Cardiologist:  Yvonne Kendall, MD    History of Present Illness: .   Alicia [name] is a 72 y.o. female with a past medical history of remote PE, left-sided breast cancer status postmastectomy and chemotherapy recently diagnosed with bone metastasis, aortic atherosclerosis, hypertension, anxiety with panic disorder, thyroid disease, who presents today for follow-up.  Coronary CTA in 2017 showed no evidence of coronary artery disease with calcium score of 0.  Echo at that time showed normal LV size and systolic function, G1 DD, mild left atrial enlargement, normal RV systolic function and ventricular cavity size, mild to moderate mitral and aortic regurgitation, mild to moderate pulmonic regurgitation mild tricuspid regurgitation.  Repeat echocardiogram in 2021 revealed EF 55 to 60%, no RWMA, mild to moderate mitral regurgitation, mild aortic valve insufficiency.  Heart monitor was notable for PACs and PVCs as well as brief episodes of PSVT, transient Mobitz type I secondary AV block was observed.  She was referred to pulmonary for evaluation of sleep apnea.  She presented to [facility]/20[facility] with altered mental status.  Per family she not taking any medications for about a week prior to hospital admission.  She had an acute UTI, acute metabolic encephalopathy, hypokalemia, hypophosphatemia.  She was treated with IV fluids and empiric antibiotics and electrolytes were repleted.  Hospital course was complicated by sepsis and pneumonia which necessitated reinitiation of IV antibiotics.  She was then stable for discharge on 08/01/2022.  She was last seen in clinic 09/28/2022 and had recently been [facility] for [facility] but she had been home for approximately 3 weeks.  She had complaints of shortness of breath on exertion, peripheral  edema and some left upper extremity likely lymphedema.  She was doing weights daily and limiting her sodium intake and fluid intake.  She was scheduled for an updated echocardiogram.  She returns to clinic today accompanied by family member.  She states overall she has been doing fairly well.  She denies any chest discomfort, shortness of breath, or palpitations.  She continues to have some swelling to her bilateral lower extremities. she says decreasing her fluid intake has made her extremely dry.  But she has been tolerating the decreased fluid intake better.  She also has less swelling to her left upper extremity where she suffers from lymphedema.  She is no longer requiring wraps to her arm but is able to wear a compression sleeve and a glove which has helped.  She has been compliant with her current medication regimen.  Recently had blood work done by her PCP that was stable.  Denies any hospitalizations or visits to the emergency department.  ROS: 10 point review of system has been reviewed and considered negative with exception of what is been listed in the HPI  Studies Reviewed: Marland Kitchen   EKG Interpretation Date/Time:  Tuesday October 23 2022 14:23:00 EDT Ventricular Rate:  83 PR Interval:  192 QRS Duration:  76 QT Interval:  370 QTC Calculation: 434 R Axis:   28  Text Interpretation: Normal sinus rhythm Normal ECG When compared with ECG of 28-Sep-2022 14:37, No significant change was found Confirmed by Charlsie Quest ([id number]) on 10/23/2022 2:29:39 PM   TTE 10/19/22 1. Left ventricular ejection fraction, by estimation, is 55 to 60%. The  left ventricle has normal function. The left ventricle has  no regional  wall motion abnormalities. Left ventricular diastolic parameters are  consistent with Grade I diastolic  dysfunction (impaired relaxation).   2. Right ventricular systolic function is normal. The right ventricular  size is normal.   3. The mitral valve is normal in structure. No evidence  of mitral valve  regurgitation.   4. The aortic valve is tricuspid. Aortic valve regurgitation is mild.   5. The inferior vena cava is normal in size with greater than 50%  respiratory variability, suggesting right atrial pressure of 3 mmHg.   2D echo 12/03/2019: 1. Left ventricular ejection fraction, by estimation, is 55 to 60%. The  left ventricle has normal function. The left ventricle has no regional  wall motion abnormalities. Left ventricular diastolic parameters were  normal.   2. Right ventricular systolic function is normal. The right ventricular  size is normal. There is normal pulmonary artery systolic pressure.   3. The mitral valve is normal in structure. Mild to moderate mitral valve  regurgitation.   4. The aortic valve is tricuspid. Aortic valve regurgitation is mild.   5. The inferior vena cava is normal in size with greater than 50%  respiratory variability, suggesting right atrial pressure of 3 mmHg. __________   MUGA 07/2017: IMPRESSION: Normal LEFT ventricular ejection fraction of 61%. Normal LEFT ventricular wall motion. __________   Nuclear stress test 02/2015: Defect 1: There is a small defect of mild severity present in the basal anterior location. Findings consistent with ischemia. This is an intermediate risk study. The left ventricular ejection fraction is hyperdynamic (>65%). Risk Assessment/Calculations:             Physical Exam:   VS:  BP 138/74 (BP Location: Right Arm, Patient Position: Sitting, Cuff Size: Normal)   Pulse 83   Ht 5\' 6"  (1.676 m)   Wt 187 lb (84.8 kg)   SpO2 98%   BMI 30.18 kg/m    Wt Readings from Last 3 Encounters:  10/23/22 187 lb (84.8 kg)  10/18/22 187 lb (84.8 kg)  10/08/22 190 lb (86.2 kg)    GEN: Well nourished, well developed in no acute distress NECK: No JVD; No carotid bruits CARDIAC: RRR, no murmurs, rubs, gallops RESPIRATORY:  Clear to auscultation without rales, wheezing or rhonchi  ABDOMEN: Soft,  non-tender, non-distended EXTREMITIES:  1+ edema to BLE; No deformity, left upper extremity continues to have compression sleeve on and intact  ASSESSMENT AND PLAN: .   Occasional shortness of breath that has remained stable.  She previously had elevated BNP and was scheduled for an echocardiogram.  Echocardiogram revealed an LVEF of 55 to 60%, no regional wall motion abnormalities, G1 DD, and mild aortic regurgitation.  Study is overall unchanged from prior study in 2021.  Previously she had noticed weight loss and decrease in swelling with her HCTZ dose to 12.5 mg daily and she was left at that dose.  She has been continued on that as well as to continue to avoid excess fluid intake.  She has also been encouraged to continue to monitor her sodium intake to prevent increased amount of swelling.  Aortic atherosclerosis with hyperlipidemia which was incidentally noted on CT of the chest during ED visit.  Previous coronary CTA revealed right normal dominant coronary arteries with coronary calcium score of 0 in 2017.  For secondary prevention she should continue aspirin 81 mg daily and rosuvastatin 5 mg daily.  Blood work was just previously done waiting for results.  Left upper extremity lymphedema.  And recent duplex negative for DVT.  She has been followed by lymphedema clinic.  History of palpitations with PSVT previous heart monitor worn.  She also had Mobitz type I noted on Zio. Denies any reoccurrence of palpitations.  EKG today reveals sinus rhythm with a rate of 83.  She is no longer on beta-blocker therapy due to abnormal ZIO.  Will continue to monitor with surveillance EKGs.  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       Dispo: Patient to return to clinic to see MD/APP in 3 months or sooner for reevaluation of symptoms  Signed, Tajai Suder, NP

## 2022-10-23 NOTE — Patient Instructions (Signed)
Medication Instructions:  Your physician recommends that you continue on your current medications as directed. Please refer to the Current Medication list given to you today.  *If you need a refill on your cardiac medications before your next appointment, please call your pharmacy*  Lab Work: -None ordered  Testing/Procedures: -None ordered  Follow-Up: At [facility], you and your health needs are our priority.  As part of our continuing mission to provide you with exceptional heart care, we have created designated Provider Care Teams.  These Care Teams include your primary Cardiologist (physician) and Advanced Practice Providers (APPs -  Physician Assistants and Nurse Practitioners) who all work together to provide you with the care you need, when you need it.  Your next appointment:   3 - 4 month(s)  Provider:   Yvonne Kendall, MD or Charlsie Quest, NP    Other Instructions -None

## 2022-10-25 ENCOUNTER — Ambulatory Visit

## 2022-10-26 NOTE — Progress Notes (Signed)
[facility] made outreach attempts to this patient for 4 weeks but never reached her.   This patient is welcome to contact [facility] if interested in the future.

## 2022-10-30 ENCOUNTER — Ambulatory Visit: Attending: Medical Oncology

## 2022-10-30 ENCOUNTER — Ambulatory Visit

## 2022-10-30 DIAGNOSIS — I972 Postmastectomy lymphedema syndrome: Secondary | ICD-10-CM | POA: Insufficient documentation

## 2022-10-30 NOTE — Therapy (Addendum)
OUTPATIENT OT UPPER EXTREMITY LYMPHEDEMA DISCHARGE NOTE  Patient Name: Alicia [name] MRN: [id number] DOB:February 24, 1950, 72 y.o., female Today's Date: 10/30/2022  END OF SESSION:   OT End of Session - 10/30/22 1618     Visit Number 9    Number of Visits 24    Date for OT Re-Evaluation 12/06/22    Progress Note Due on Visit 10    OT Start Time 1530    OT Stop Time 1612    OT Time Calculation (min) 42 min    Equipment Utilized During Treatment transport chair    Activity Tolerance Patient tolerated treatment well    Behavior During Therapy WFL for tasks assessed/performed             Past Medical History:  Diagnosis Date   Anxiety    Breast cancer, left (HCC) 07/2017   Depression    Hypertension    Hypothyroidism    Personal history of chemotherapy 2019   LEFT mastectomy-chemo before   Personal history of radiation therapy 03/2018   LEFT mastectomy   Rapid heart rate    Thyroid disease    Past Surgical History:  Procedure Laterality Date   AXILLARY LYMPH NODE BIOPSY Left 07/16/2017   METASTATIC MAMMARY CARCINOMA   BREAST BIOPSY Left 07/16/2017   [place] bx of left breast mass and left breast LN.  INVASIVE MAMMARY CARCINOMA, NO SPECIAL TYPE.    BREAST EXCISIONAL BIOPSY Right 2001   benign   BREAST LUMPECTOMY WITH SENTINEL LYMPH NODE BIOPSY Left 12/06/2017   Procedure: BREAST LUMPECTOMY WITH SENTINEL LYMPH NODE BX;  Surgeon: Alicia Mayotte, MD;  Location: [facility];  Service: General;  Laterality: Left;   COLONOSCOPY     MASTECTOMY Left 12/23/2017   PORTACATH PLACEMENT Right 08/07/2017   Procedure: INSERTION PORT-A-CATH;  Surgeon: Alicia Mayotte, MD;  Location: [facility];  Service: General;  Laterality: Right;   SIMPLE MASTECTOMY WITH AXILLARY SENTINEL NODE BIOPSY Left 12/23/2017   T2,N2 with 6/7 nodes positive. Whole breast radiation.  Surgeon: Alicia Mayotte, MD;  Location: [facility];  Service: General;  Laterality: Left;   TONSILLECTOMY     Patient Active  Problem List   Diagnosis Date Noted   Pneumonia 07/24/2022   Palliative care encounter 07/24/2022   Malnutrition of moderate degree 07/20/2022   Acute UTI 07/20/2022   Pressure injury of skin 07/20/2022   Hypokalemia 07/19/2022   Hypophosphatemia 07/19/2022   Acute metabolic encephalopathy 07/19/2022   Generalized weakness 07/18/2022   AKI (acute kidney injury) (HCC) 07/18/2022   Electrolyte abnormality 07/18/2022   QT prolongation 07/18/2022   Diarrhea 07/18/2022   Altered mental status 07/18/2022   Contact dermatitis 05/10/2021   Hematuria 03/31/2021   Urinary tract infection without hematuria 03/01/2021   PSVT (paroxysmal supraventricular tachycardia) (HCC) 02/20/2020   Mobitz type 1 second degree AV block 02/20/2020   Pure hypercholesterolemia 02/20/2020   Daytime somnolence 02/18/2020   Chest pain 11/13/2019   Palpitations 11/13/2019   Obesity (BMI 35.0-39.9 without comorbidity) 11/08/2019   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 Corky [name]  REFERRING PROVIDER: Clent Jacks, PA-C  REFERRING DIAG:  Diagnosis  R60.0 (ICD-10-CM) - Edema of left upper extremity  C50.912,C79.51 (ICD-10-CM) - Carcinoma of left breast metastatic to bone (HCC)  I89.0 (ICD-10-CM) - Lymphedema   THERAPY DIAG:  Postmastectomy lymphedema syndrome  ONSET DATE: 07/2022  Rationale for Evaluation and Treatment: Rehabilitation  SUBJECTIVE                                                                                                                                                                                           SUBJECTIVE STATEMENT:  Pt reports she was able to go to [facility] to initiate process of ordering a compression glove.  See plan below for recommended compression.  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  LUE lymphedema, BLE edema.  Per chart from Dr. Orlie [name] on 08/15/22: ONCOLOGY HISTORY: Patient initially received neoadjuvant chemotherapy with Adriamycin and Cytoxan followed by weekly Taxol. She only received 4 cycles of weekly Taxol prior to discontinuation of treatment on October 31, 2017 secondary to persistent peripheral neuropathy. She ultimately required mastectomy and final pathology noted 5 of 6 lymph nodes positive for disease. Patient completed adjuvant XRT in mid April 2020. Pain initiated letrozole in May 2020, this was discontinued secondary to side effects and patient was started on anastrozole. Nuclear medicine bone scan on June 19, 2019 revealed metastatic disease and anastrozole was subsequently discontinued. PET scan results from October 02, 2021 revealed no clear evidence of metastatic progression. PET scan results from June 30, 2019 reviewed independently with metastatic bony disease, but no obvious evidence of visceral disease. MRI of the brain on August 24, 2019 reviewed independently with no obvious evidence of metastatic disease.   Pt reports that she was hospitalized in July for a UTI which resulted in sepsis.  Pt was discharged from the hospital and sent to rehab at [facility] where she states her lymphedema worsened there at the end of July.  Pt reports she has been given 2 different antibiotics, and she has 2 days left of Doxycycline, though she reports she has a refill.  OT encouraged pt/family follow up with MD to see if they would like her to refill the medication, as pt does still have mild heat radiating from the forearm and pinkish tint to this area.  Pt has also had recent  BP med changes, stating that she was on hydrochlorothiazide, got switched to lasix, and now is back on hydrochlorothiazide.  Pt returns for cardiology follow up Sept 13th and she reports the swelling in her BLEs is new.   Per chart from visit with Alicia Jacks, PA on 09/12/22:  Patient is here to follow up on left arm swelling and pain. She has had lymphedema for quite some time secondary to her breast surgeries/lymph node removal. After being in the hospital for altered mental status/dehydration/AKI and rehabilitation, the arm swelling and pain worsened. At her last visit on 09/05/2022 a doppler was performed which showed no evidence of DVT of arm. She was started on a 10 day course of doxycyline.    Today she reports that the swelling is about the same, if not worse. She has also developed gradually worsening peripheral edema since her rehabilitation facility switched her off of hydrochlorothiazide 12.5 mg onto lasix (unknown dose). She denies SOB but is having trouble ambulating due to the swelling and discomfort. She is unsure about weight changes. She has been fluid restricting.   PAIN: 10/11/22: Pt denies any LUE pain this date Are you having pain? Yes NPRS scale: 1-3/10 Pain location: LUE Pain orientation: Left  PAIN TYPE: aching, tight, and tingling, heavy Pain description: constant  Aggravating factors: reaching up Relieving factors: rest, elevating the arm  PRECAUTIONS: None  WEIGHT BEARING RESTRICTIONS: No  FALLS:  Has patient fallen in last 6 months? No  LIVING ENVIRONMENT: Lives with: lives with their family, lives with Lowella Bandy, daughter (who works from home), and son, [name] Lives in: 2 level home Stairs: Yes: Internal: 1 flight steps; on right going up and bilateral but cannot reach both and External: 1 steps; none Has following equipment at home: Dan Humphreys - 2 wheeled and [employer] - 4 wheeled (uses 2 wheeled downstairs and 4 wheeled upstairs on carpet)  OCCUPATION:  retired  LEISURE: watch tv, read  PRIOR LEVEL OF FUNCTION: Independent and living alone until her d/c from [facility] rehab end of July 2024, at which point pt moved in with daughter and son d/t requiring increased level of care.  PATIENT GOALS: "Reduce the swelling in my L arm and my legs and build up my strength."  OBJECTIVE  COGNITION:  Overall cognitive status: Within functional limits for tasks assessed ; 0x4, but does appear slightly forgetful  PALPATION: LUE skin is soft in the upper arm, more taut in the forearm and hand, noting at least 2+ pitting edema in the L forearm, hand, and BLEs.  L forearm is slightly warm to the touch and has a pinkish tint as compared to R unaffected side.  OBSERVATIONS: Pt and daughter pleasant, cooperative, and receptive to initial discussion of care plan.   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  LUE does appear quite heavy, with pt using R hand to cradle the arm at times.   HAND DOMINANCE: Right  UPPER EXTREMITY AROM:  AROM RIGHT   eval   Shoulder extension   Shoulder flexion [facility]  Shoulder abduction [facility]  Shoulder internal rotation WNL  Shoulder external rotation WFL    (Blank rows = not tested)  AROM LEFT   eval Left 10/30/22  Shoulder extension    Shoulder flexion 111* 125*  Shoulder abduction 100* 160*  Shoulder internal rotation WNL WNL  Shoulder external rotation 85* with arm abd at 90* 90*    (Blank rows = not tested)   CERVICAL AROM: NT at eval  UPPER EXTREMITY STRENGTH:  R shoulder flex/abd: 4-;  L shoulder flex 3+ (within available range), L shoulder abd 4- (within available range) R/L elbow flex/ext: 4+;  R/L wrist flex/ext  4+ 10/30/22: L shoulder flex 4/5, L shoulder abd 4+, L elbow flex/ext 4+, L wrist flex/ext 4+  LOWER EXTREMITY AROM/PROM: NT  LYMPHEDEMA ASSESSMENTS: see circumferential measurements noted below; additional testing to be completed in upcoming sessions as needed  SURGERY TYPE/DATE: L mastectomy 12/23/2017  NUMBER OF LYMPH NODES REMOVED: 6-7 L axillary lymph nodes removed  CHEMOTHERAPY: normally takes a chemo pill daily but currently paused until pt can get her strength back.    RADIATION: 3 rounds of radiation, with last tx being May 31st 2024; radiation targeting L hip and pelvis.  Pt reports 16 total treatments in the pelvic area.  HORMONE TREATMENT: had been taking a hormone replacement pill but recently stopped in the hospital; may restart it, per pt/daughter  INFECTIONS: question for LUE cellulitis and subsequently given ABX x2  -Pt reports she did have a sacral pu from recent hospitalization but cleared up at [facility], and denied infection.  LYMPHEDEMA ASSESSMENTS: circumferential measurements in cm   LANDMARK RIGHT eval  10 cm proximal to olecranon process 31.3 cm  Olecranon process 27.0 cm  10 cm proximal to ulnar styloid process 20.2 cm  Just proximal to ulnar styloid process 15.9 cm  Across hand at thumb web space 20.6 cm  At base of 3rd digit 6.5 cm    LANDMARK LEFT  eval  10 cm proximal to olecranon process 38.0 cm  Olecranon process 31.8 cm  10 cm proximal to ulnar styloid process 26.5 cm  Just proximal to ulnar styloid process 19.8 cm  Across hand at thumb web space 21.5 cm  At base of 3rd digit 6.6 cm       Landmark Right 09/18/22  Left 09/18/22 Left 10/11/22 Left 10/30/22  1  MP (distal base of SF) 20.3 (N/A) 20.6 (N/A) 19.8 (N/A) 19.6 (N/A)  2  Wrist crease 16.6 18.5 15.8 15.7  3 Forearm (*) +4 cm 16.8 20.5 16.0 16.0  4 * +4 cm 19.5 23.1 17.5 18.6  5 * +4 cm 23.5 27.3 19.7 22.4  6 * +4 cm 26.0 29.6 22.3 25.4  7 * +4 cm 27.9 31.9 24.2 26.8  8  Elbow crease  27.5 31.3 25.5 27.0  9 Upper arm (**) +4 cm 29.8 34.3 26.8 29.8  10 ** +4 cm 32.5 37.0 29.3 31.7  11 ** +4 cm 32.0 36.4 31.2 31.4  12 ** +4 cm 33.6 36.0 33.2 33.5                    Total Limb Volume: ml 2482.2 ml 3209.0 ml 2096.3 ml 2369.4 ml    Limb volume differential % L 22.6 % larger than R       Volume change from initial V  L arm reduced by 34.7% (bandage phase) L arm reduced by 26.2%  (compression garment phase)     FUNCTIONAL TESTS: none  GAIT:  Distance walked: NT Assistive device utilized:  transport chair, RW Level of assistance: Modified independence short distances inside the home on level ground with RW  PATIENT SURVEYS:  FOTO To complete next session 09/18/22: 56; predicted 67  TODAY'S TREATMENT:  DATE: 10/30/22 Therapeutic Exercise: Objective measures taken and goals updated and reviewed for discharge summary.  HEP reviewed, including cane stretches and AROM throughout the LUE for promoting lymphatic flow and flexibility for ADLs; pt indep.  Self Care: Demonstrated indep with doffing/donning compression sleeve and glove.  Reviewed wearing schedule of garments, indications for use of compression garments for daily wear vs return to compression bandages if experiencing increase in fluid in the LUE.  Reviewed optimal skin care for reducing infection risk and lymphedema flare ups.  Reviewed other lymphedema precautions, including avoiding needle sticks and blood pressures in the lymphedema affected arm, maintaining low sodium diet, and ensuring use of compression and performance of exercise to prevent lymphedema flare ups during air travel or long car rides.  Pt verbalized understanding of all education provided, and has visual and written handouts for above education.   PATIENT EDUCATION:  Education details: Long term lymphedema  management Person educated: Patient Education method: Explanation, written/visual handouts Education comprehension: verbalized understanding  HOME EXERCISE PROGRAM: LUE A/AAROM, diaphragmatic breathing, self MLD  GOALS: Goals reviewed with patient? Yes  SHORT TERM GOALS: Target date: 10/25/22  (6 weeks)  Pt will perform HEP with set up/min vc for promoting lymph flow and LUE flexibility for better engagement of the LUE with daily tasks. Baseline: Eval: HEP not yet initiated; 10/30/22: indep with cane stretches and AROM throughout LUE. Goal status: MET  2.  Pt will demo accurate technique to apply and wear layered compression bandages as instructed on the LUE 23 hours/day with 75% or better consistency in order to reduce LUE limb volume.  Baseline: Eval: Will plan to issue and begin bandaging/educ in the next 1-2 sessions; 10/30/22: Multiple family members are indep with compression bandaging technique.  Pt demonstrated excellent adherence to wearing schedule in bandaging phase of tx, and no longer requires bandaging d/t progression to maintenance phase of CDT (now wearing daily compression garments and doffing at night time).  Goal status: MET  3. Pt/caregiver will be indep to verbalize 3-4 lymphedema precautions to prevent exacerbations and/or infections. Baseline: Eval: educ not yet initiated; 10/30/22: indep with lymphedema precautions, but also has written handouts to refer to d/t hx STM defits.  Goal status: MET   LONG TERM GOALS: Target date: 12/06/22 (12 weeks)  Pt will increase FOTO score to 67 or better to indicate improvement in self perceived functional use of the L arm with daily tasks. Baseline: Eval: FOTO to be completed next session (time constraints at eval); 09/18/22: 56; predicted 67; 10/30/22: 72 Goal status: MET  2. Pt/caregiver will demo LUE MLD techniques with min vc or less and use of visual handout to promote fluid mobilization in the LUE.     Baseline: Eval:  Educ not yet initiated; 10/30/22: Pt performs with min vc and has written handouts for reference.  Goal status: MET  3. Pt to measure 8-10% or more limb volume reduction in the LUE in order to promote healthy skin integrity, reduce infection risk, improve fit of clothing, and ease ability to engage the LUE into daily tasks.  Baseline: Eval: initial circumferential measures indicate LUE to be ~15% larger than RUE, but will plan to measure actual volumetrics in upcoming tx session; 10/30/22: 26.2% volume reduction in LUE since initial (see chart) Goal status: MET  4. Pt to be fitted with LUE compression garment for long-term maintenance of fluid loss in the LUE. Baseline: Eval: to be fit during last phase of CDT program; 10/30/22: Pt now has  2 compression sleeves and 1 glove (a second glove has been ordered).  Pt demos independence with wearing schedule and donning/doffing both garments.   Goal status: MET  ASSESSMENT: CLINICAL IMPRESSION: Pt seen for 9th OT visit to address LUE lymphedema.  Pt returns with new compression glove and her previously ordered compression sleeve.  All noted to fit properly.  Pt has a new sleeve at home and a 2nd glove has been ordered so that she will have 1 set to wash, 1 set to wear.  Pt returns today after being put on hold since OT visit on 10/11/22 to assess ability to maintain fluid loss with transition away from compression bandages to daily wearing of compression sleeve and glove.  Overall, pt showing good maintenance of limb volume, noting an overall 26.2% volume reduction in LUE since initial eval.  Pt and family members demonstrate good carryover of lymphedema management strategies.  All OT goals met.  No additional skilled OT indicated at this time.  Pt in agreement with plan.    OBJECTIVE IMPAIRMENTS: decreased activity tolerance, decreased endurance, decreased knowledge of condition, decreased knowledge of use of DME, decreased mobility, decreased ROM, decreased  strength, hypomobility, increased edema, increased fascial restrictions, impaired flexibility, impaired sensation, impaired UE functional use, improper body mechanics, postural dysfunction, obesity, and pain.   ACTIVITY LIMITATIONS: carrying, lifting, bending, standing, squatting, sleeping, stairs, transfers, bed mobility, bathing, toileting, dressing, reach over head, hygiene/grooming, and locomotion level  PARTICIPATION LIMITATIONS: meal prep, cleaning, laundry, medication management, driving, shopping, and community activity  PERSONAL FACTORS: Age, Education, Fitness, Past/current experiences, Time since onset of injury/illness/exacerbation, and 3+ comorbidities: stage 4 ca, L mastectomy, lymph node removal/radiation, anxiety, depression, cardiac hx (see chart), obesity  are also affecting patient's functional outcome.   REHAB POTENTIAL: Fair for goals  CLINICAL DECISION MAKING: Unstable/unpredictable  EVALUATION COMPLEXITY: High  PLAN:  Patient presents with mild unilateral left upper extremity lymphedema secondary to surgery.  Lymphedema extends from fingers to the L axilla.  Patient requires a standard fit circular knit compression sleeve and a separate compression glove; quantity of 3 of each for wash and wear.   OT FREQUENCY: 2x/week  OT DURATION: 12 weeks  PLANNED INTERVENTIONS: Therapeutic exercises, Therapeutic activity, Patient/Family education, Self Care, DME instructions, Manual lymph drainage, Compression bandaging, scar mobilization, Taping, Vasopneumatic device, and Manual therapy  PLAN FOR NEXT SESSION: N/A; d/c this visit   Danelle Earthly, MS, OTR/L, CLT  Otis Dials, OT 10/30/2022, 4:20 PM

## 2022-10-31 DIAGNOSIS — I719 Aortic aneurysm of unspecified site, without rupture: Secondary | ICD-10-CM | POA: Diagnosis not present

## 2022-10-31 DIAGNOSIS — J189 Pneumonia, unspecified organism: Secondary | ICD-10-CM | POA: Diagnosis not present

## 2022-10-31 DIAGNOSIS — A419 Sepsis, unspecified organism: Secondary | ICD-10-CM | POA: Diagnosis not present

## 2022-10-31 DIAGNOSIS — I10 Essential (primary) hypertension: Secondary | ICD-10-CM | POA: Diagnosis not present

## 2022-10-31 DIAGNOSIS — N390 Urinary tract infection, site not specified: Secondary | ICD-10-CM | POA: Diagnosis not present

## 2022-10-31 DIAGNOSIS — G4733 Obstructive sleep apnea (adult) (pediatric): Secondary | ICD-10-CM | POA: Diagnosis not present

## 2022-11-01 ENCOUNTER — Ambulatory Visit

## 2022-11-01 DIAGNOSIS — A419 Sepsis, unspecified organism: Secondary | ICD-10-CM | POA: Diagnosis not present

## 2022-11-01 DIAGNOSIS — I10 Essential (primary) hypertension: Secondary | ICD-10-CM | POA: Diagnosis not present

## 2022-11-01 DIAGNOSIS — J189 Pneumonia, unspecified organism: Secondary | ICD-10-CM | POA: Diagnosis not present

## 2022-11-01 DIAGNOSIS — I719 Aortic aneurysm of unspecified site, without rupture: Secondary | ICD-10-CM | POA: Diagnosis not present

## 2022-11-01 DIAGNOSIS — G4733 Obstructive sleep apnea (adult) (pediatric): Secondary | ICD-10-CM | POA: Diagnosis not present

## 2022-11-01 DIAGNOSIS — N390 Urinary tract infection, site not specified: Secondary | ICD-10-CM | POA: Diagnosis not present

## 2022-11-05 DIAGNOSIS — I719 Aortic aneurysm of unspecified site, without rupture: Secondary | ICD-10-CM | POA: Diagnosis not present

## 2022-11-05 DIAGNOSIS — A419 Sepsis, unspecified organism: Secondary | ICD-10-CM | POA: Diagnosis not present

## 2022-11-05 DIAGNOSIS — J189 Pneumonia, unspecified organism: Secondary | ICD-10-CM | POA: Diagnosis not present

## 2022-11-05 DIAGNOSIS — G4733 Obstructive sleep apnea (adult) (pediatric): Secondary | ICD-10-CM | POA: Diagnosis not present

## 2022-11-05 DIAGNOSIS — I10 Essential (primary) hypertension: Secondary | ICD-10-CM | POA: Diagnosis not present

## 2022-11-05 DIAGNOSIS — N390 Urinary tract infection, site not specified: Secondary | ICD-10-CM | POA: Diagnosis not present

## 2022-11-06 ENCOUNTER — Other Ambulatory Visit: Payer: Self-pay

## 2022-11-06 ENCOUNTER — Ambulatory Visit

## 2022-11-07 ENCOUNTER — Other Ambulatory Visit: Payer: Self-pay

## 2022-11-08 ENCOUNTER — Ambulatory Visit

## 2022-11-09 ENCOUNTER — Telehealth: Payer: Self-pay | Admitting: *Deleted

## 2022-11-09 NOTE — Telephone Encounter (Signed)
Call returned to [facility] and it went to voice mail. Left message for him to take her to ER for evaluation

## 2022-11-09 NOTE — Telephone Encounter (Signed)
Alicia [name] called reporting that patient is not doing well for the past month after starting to take her cancer medicine, she is not eating or drinking , he feels she is dehydrating and states that she is losing like a pound a day like she did back in July when she ended up in the hospital. He is asking for her to be evaluated. Please advise

## 2022-11-13 ENCOUNTER — Ambulatory Visit

## 2022-11-14 NOTE — Progress Notes (Signed)
Oral Chemotherapy Clinic [facility]  [phone] [phone]  [name], [name], [name], [name] Jim Like, RN as Registered Nurse Orlie Dakin Tollie Pizza, MD as Consulting Physician (Oncology) Carmina Miller, MD as Referring Physician (Radiation Oncology) Chriss Driver, RN as Registered Nurse   Name of the patient: Alicia [name]  [id number]  [birth year]   Date of visit: 11/14/22  HPI: Patient is a 72 y.o. female with metastatic ER+ HER2- breast cancer. Previously treated with palbociclib, but treatment was changed to Afinitor (everolimus) and exemestane due to disease progression 03/2022. Everolimus 10 mg held on 05/22/22 due to potential drug rash, patient started on steroid taper. Rash cleared and patient resumed everolimus 10 mg on 05/31/22. Patient called to report rash return on 06/04/22. Treatment was again held and prescription for hydrocortisone 2.5% cream sent in. Patient resumed everolimus 7.5mg  on 06/18/22 along with loratidine. Patient was admitted to [facility] hospital for altered mental status, dehydration, and weakness on 07/18/22, everolimus and exemestane were held with admission and at discharge. Patient resumed everolimus (at 5mg )  and exemestane on 09/20/22. Everolimus was again held on 10/05/22 due to weakness and fatigue, everolimus 5mg  was resumed on 10/08/22.  Reason for Consult: Oral chemotherapy follow-up for everolimus and exemestane therapy.   PAST MEDICAL HISTORY: Past Medical History:  Diagnosis Date   Anxiety    Breast cancer, left (HCC) 07/2017   Depression    Hypertension    Hypothyroidism    Personal history of chemotherapy 2019   LEFT mastectomy-chemo before   Personal history of radiation therapy 03/2018   LEFT mastectomy   Rapid heart rate    Thyroid disease     HEMATOLOGY/ONCOLOGY HISTORY:  Oncology History Overview  Note  Patient is a 72 year old female who recently self palpated a mass on her left breast.  Subsequent imaging and biopsy revealed the above-stated breast cancer.  Case was also discussed extensively at case conference.  Given the size and the stage of patient's malignancy, she will benefit from neoadjuvant chemotherapy using Adriamycin, Cytoxan, and Taxol.  Patient will also require Neulasta support.  Will get CT scan of the chest, abdomen, and pelvis to assess for any metastatic disease.  Patient will also require port placement and MUGA prior to initiating treatment  CT abdomen/pelvis/chest did not reveal any suspicious lesions concerning for metastatic disease. (08/01/17)  Port-A-Cath placed on 08/07/2017.  Cycle 1 day 1 of AC was given on 08/08/17.     Breast cancer, stage 2, left (HCC)  07/24/2017 Initial Diagnosis   Breast cancer, stage 2, left (HCC)   07/30/2017 Cancer Staging   Staging form: Breast, AJCC 8th Edition - Clinical stage from 07/30/2017: Stage IIA (cT2, cN1, cM0, G2, ER+, PR+, HER2-) - Signed by Jeralyn Ruths, MD on 07/30/2017   08/08/2017 - 10/31/2017 Chemotherapy   The patient had DOXOrubicin (ADRIAMYCIN) chemo injection 130 mg, 60 mg/m2 = 130 mg, Intravenous,  Once, 4 of 4 cycles Administration: 130 mg (08/08/2017), 130 mg (08/22/2017), 130 mg (09/05/2017), 130 mg (09/19/2017) palonosetron (ALOXI) injection 0.25 mg, 0.25 mg, Intravenous,  Once, 4 of 4 cycles Administration: 0.25 mg (08/08/2017), 0.25 mg (08/22/2017), 0.25 mg (09/05/2017), 0.25 mg (09/19/2017) pegfilgrastim-cbqv (UDENYCA) injection 6 mg, 6 mg, Subcutaneous, Once, 4 of 4 cycles Administration: 6 mg (08/09/2017), 6 mg (08/23/2017), 6 mg (09/06/2017), 6 mg (09/20/2017) cyclophosphamide (CYTOXAN) 1,300 mg in sodium chloride 0.9 % 250 mL chemo infusion,  600 mg/m2 = 1,300 mg, Intravenous,  Once, 4 of 4 cycles Administration: 1,300 mg (08/08/2017), 1,300 mg (08/22/2017), 1,300 mg (09/05/2017), 1,300 mg (09/19/2017) PACLitaxel  (TAXOL) 174 mg in sodium chloride 0.9 % 250 mL chemo infusion (</= 80mg /m2), 80 mg/m2 = 174 mg, Intravenous,  Once, 4 of 12 cycles Dose modification: 72 mg/m2 (original dose 80 mg/m2, Cycle 6, Reason: Dose not tolerated) Administration: 174 mg (10/03/2017), 156 mg (10/17/2017), 156 mg (10/24/2017), 156 mg (10/31/2017) fosaprepitant (EMEND) 150 mg, dexamethasone (DECADRON) 12 mg in sodium chloride 0.9 % 145 mL IVPB, , Intravenous,  Once, 4 of 4 cycles Administration:  (08/08/2017),  (08/22/2017),  (09/05/2017),  (09/19/2017)  for chemotherapy treatment.      ALLERGIES:  is allergic to sulfa antibiotics.  MEDICATIONS:  Current Outpatient Medications  Medication Sig Dispense Refill   acetaminophen (TYLENOL) 500 MG tablet Take 1,000 mg by mouth every 6 (six) hours as needed for moderate pain or headache.      ALPRAZolam (XANAX) 0.5 MG tablet Take 1 tablet (0.5 mg total) by mouth 2 (two) times daily as needed for anxiety. 10 tablet 0   Amino Acids-Protein Hydrolys (PRO-STAT SUGAR FREE PO) Take 30 mLs by mouth 2 (two) times daily.     amLODipine (NORVASC) 5 MG tablet Take 1 tablet (5 mg total) by mouth daily. 90 tablet 3   ascorbic acid (VITAMIN C) 500 MG tablet Take 500 mg by mouth daily.     aspirin EC 81 MG tablet Take 81 mg by mouth at bedtime.      calcium carbonate (OS-CAL - DOSED IN MG OF ELEMENTAL CALCIUM) 1250 (500 Ca) MG tablet Take 1 tablet by mouth daily with breakfast.     Cholecalciferol (VITAMIN D3) 125 MCG (5000 UT) CAPS Take 5,000 Units by mouth 2 (two) times a day.     everolimus (AFINITOR) 5 MG tablet Take 1 tablet (5 mg total) by mouth daily. 30 tablet 0   exemestane (AROMASIN) 25 MG tablet Take 1 tablet (25 mg total) by mouth daily after breakfast. 30 tablet 2   hydrochlorothiazide (HYDRODIURIL) 12.5 MG tablet Take 12.5 mg by mouth daily. (Patient not taking: Reported on 10/18/2022)     levothyroxine (SYNTHROID) 100 MCG tablet Take 1 tablet (100 mcg total) by mouth daily before  breakfast.     linaclotide (LINZESS) 145 MCG CAPS capsule Take 1 capsule (145 mcg total) by mouth daily before breakfast. 30 capsule 1   loratadine (CLARITIN) 10 MG tablet Take 10 mg by mouth daily. Take to reduce risk of rash from everolimus.     MAGNESIUM CITRATE PO Take by mouth.     metoprolol tartrate (LOPRESSOR) 25 MG tablet Take 0.5 tablets (12.5 mg total) by mouth 2 (two) times daily.     Multiple Vitamin (MULTIVITAMIN WITH MINERALS) TABS tablet Take 1 tablet by mouth daily.     ondansetron (ZOFRAN) 8 MG tablet Take 1 tablet (8 mg total) by mouth every 8 (eight) hours as needed. 30 tablet 1   ondansetron (ZOFRAN) 8 MG tablet Take 1 tablet (8 mg total) by mouth every 8 (eight) hours as needed for nausea or vomiting. 20 tablet 2   pantoprazole (PROTONIX) 20 MG tablet TAKE 1 TABLET(20 MG) BY MOUTH DAILY 90 tablet 0   potassium chloride SA (KLOR-CON M) 20 MEQ tablet Take 1 tablet (20 mEq total) by mouth 2 (two) times daily. 180 tablet 3   Probiotic Product (ALIGN) 4 MG CAPS Take 1 capsule by mouth daily.  prochlorperazine (COMPAZINE) 10 MG tablet Take 1 tablet (10 mg total) by mouth every 6 (six) hours as needed for nausea or vomiting. 30 tablet 2   rosuvastatin (CRESTOR) 5 MG tablet Take 1 tablet (5 mg total) by mouth daily. 90 tablet 3   Sennosides-Docusate Sodium (SENNA PLUS PO) Take 1 capsule by mouth as needed (constipation).     traMADol (ULTRAM) 50 MG tablet Take 1 tablet (50 mg total) by mouth every 6 (six) hours as needed. 60 tablet 0   traZODone (DESYREL) 50 MG tablet Take 1 tablet (50 mg total) by mouth at bedtime.     traZODone (DESYREL) 50 MG tablet Take 1 tablet (50 mg total) by mouth at bedtime. 30 tablet 2   No current facility-administered medications for this visit.    VITAL SIGNS: There were no vitals taken for this visit. There were no vitals filed for this visit.  Estimated body mass index is 30.18 kg/m as calculated from the following:   Height as of 10/23/22: 5'  6" (1.676 m).   Weight as of 10/23/22: 84.8 kg (187 lb).  LABS: CBC:    Component Value Date/Time   WBC 4.7 10/18/2022 0935   HGB 10.5 (L) 10/18/2022 0935   HGB 10.5 (L) 10/08/2022 0902   HGB 14.2 02/11/2011 1349   HCT 33.0 (L) 10/18/2022 0935   HCT 40.9 02/11/2011 1349   PLT 241 10/18/2022 0935   PLT 249 10/08/2022 0902   PLT 151 02/11/2011 1349   MCV 89.7 10/18/2022 0935   MCV 89 02/11/2011 1349   NEUTROABS 3.7 10/18/2022 0935   LYMPHSABS 0.3 (L) 10/18/2022 0935   MONOABS 0.6 10/18/2022 0935   EOSABS 0.2 10/18/2022 0935   BASOSABS 0.0 10/18/2022 0935   Comprehensive Metabolic Panel:    Component Value Date/Time   NA 135 10/18/2022 0935   NA 142 02/11/2011 1349   K 4.0 10/18/2022 0935   K 3.8 02/11/2011 1349   CL 101 10/18/2022 0935   CL 107 02/11/2011 1349   CO2 25 10/18/2022 0935   CO2 24 02/11/2011 1349   BUN 19 10/18/2022 0935   BUN 14 02/11/2011 1349   CREATININE 1.17 (H) 10/18/2022 0935   CREATININE 0.65 02/11/2011 1349   GLUCOSE 115 (H) 10/18/2022 0935   GLUCOSE 98 02/11/2011 1349   CALCIUM 9.3 10/18/2022 0935   CALCIUM 9.0 02/11/2011 1349   AST 26 10/18/2022 0935   ALT 17 10/18/2022 0935   ALT 36 02/11/2011 1349   ALKPHOS 96 10/18/2022 0935   ALKPHOS 122 02/11/2011 1349   BILITOT 0.5 10/18/2022 0935   PROT 6.9 10/18/2022 0935   PROT 7.0 02/11/2011 1349   ALBUMIN 3.7 10/18/2022 0935   ALBUMIN 4.1 02/11/2011 1349     Present during today's visit: Patient and daughter-in-law  Assessment and Plan: CBC/CMP reviewed and patient is feeling improved, continue everolimus (at 5mg )  and exemestane Patient continues to live with her son. Her movement around the house is improving with physical therapy.   Oral Chemotherapy Side Effect/Intolerance:  Nausea: patient reports occasional nausea, currently using ondansetron prn. Rx sent for prochlorperazine so patient can alternate therapy if needed. Constipation: manageable  Oral Chemotherapy Adherence: No  reported missed doses. No patient barriers to medication adherence identified.   New medications: none reported  Medication Access Issues: no issues, pt fills at [facility]  Patient expressed understanding and was in agreement with this plan. She also understands that She can call clinic at any time with  any questions, concerns, or complaints.   Follow-up plan: RTC in 4 weeks  Thank you for allowing me to participate in the care of this very pleasant patient.   Time Total: 15 mins  Visit consisted of counseling and education on dealing with issues of symptom management in the setting of serious and potentially life-threatening illness.Greater than 50%  of this time was spent counseling and coordinating care related to the above assessment and plan.  Signed by: Remi Haggard, PharmD, BCPS, BCOP, CPP Hematology/Oncology Clinical Pharmacist Practitioner [place]/DB/AP Oral Chemotherapy [facility] [phone]

## 2022-11-15 ENCOUNTER — Other Ambulatory Visit: Payer: Medicare Other

## 2022-11-15 ENCOUNTER — Ambulatory Visit

## 2022-11-15 ENCOUNTER — Inpatient Hospital Stay: Payer: Medicare Other | Admitting: Pharmacist

## 2022-11-15 ENCOUNTER — Inpatient Hospital Stay: Payer: Medicare Other

## 2022-11-15 ENCOUNTER — Ambulatory Visit: Payer: Medicare Other | Admitting: Pharmacist

## 2022-11-15 ENCOUNTER — Other Ambulatory Visit: Payer: Self-pay

## 2022-11-15 ENCOUNTER — Inpatient Hospital Stay (HOSPITAL_BASED_OUTPATIENT_CLINIC_OR_DEPARTMENT_OTHER): Payer: Medicare Other | Admitting: Oncology

## 2022-11-15 ENCOUNTER — Ambulatory Visit: Payer: Medicare Other | Admitting: Oncology

## 2022-11-15 VITALS — BP 132/67 | HR 110 | Temp 97.7°F | Wt 175.6 lb

## 2022-11-15 DIAGNOSIS — C50912 Malignant neoplasm of unspecified site of left female breast: Secondary | ICD-10-CM

## 2022-11-15 DIAGNOSIS — C7951 Secondary malignant neoplasm of bone: Secondary | ICD-10-CM

## 2022-11-15 DIAGNOSIS — Z170 Estrogen receptor positive status [ER+]: Secondary | ICD-10-CM | POA: Diagnosis not present

## 2022-11-15 DIAGNOSIS — D649 Anemia, unspecified: Secondary | ICD-10-CM | POA: Diagnosis not present

## 2022-11-15 DIAGNOSIS — I890 Lymphedema, not elsewhere classified: Secondary | ICD-10-CM | POA: Diagnosis not present

## 2022-11-15 DIAGNOSIS — F419 Anxiety disorder, unspecified: Secondary | ICD-10-CM

## 2022-11-15 DIAGNOSIS — G629 Polyneuropathy, unspecified: Secondary | ICD-10-CM | POA: Diagnosis not present

## 2022-11-15 LAB — CBC WITH DIFFERENTIAL/PLATELET
Abs Immature Granulocytes: 0.07 10*3/uL (ref 0.00–0.07)
Basophils Absolute: 0 10*3/uL (ref 0.0–0.1)
Basophils Relative: 1 %
Eosinophils Absolute: 0.1 10*3/uL (ref 0.0–0.5)
Eosinophils Relative: 3 %
HCT: 32.6 % — ABNORMAL LOW (ref 36.0–46.0)
Hemoglobin: 10.3 g/dL — ABNORMAL LOW (ref 12.0–15.0)
Immature Granulocytes: 2 %
Lymphocytes Relative: 6 %
Lymphs Abs: 0.2 10*3/uL — ABNORMAL LOW (ref 0.7–4.0)
MCH: 26 pg (ref 26.0–34.0)
MCHC: 31.6 g/dL (ref 30.0–36.0)
MCV: 82.3 fL (ref 80.0–100.0)
Monocytes Absolute: 0.5 10*3/uL (ref 0.1–1.0)
Monocytes Relative: 13 %
Neutro Abs: 2.7 10*3/uL (ref 1.7–7.7)
Neutrophils Relative %: 75 %
Platelets: 217 10*3/uL (ref 150–400)
RBC: 3.96 MIL/uL (ref 3.87–5.11)
RDW: 17.1 % — ABNORMAL HIGH (ref 11.5–15.5)
WBC: 3.5 10*3/uL — ABNORMAL LOW (ref 4.0–10.5)
nRBC: 0 % (ref 0.0–0.2)

## 2022-11-15 LAB — CMP (CANCER CENTER ONLY)
ALT: 31 U/L (ref 0–44)
AST: 50 U/L — ABNORMAL HIGH (ref 15–41)
Albumin: 3.5 g/dL (ref 3.5–5.0)
Alkaline Phosphatase: 87 U/L (ref 38–126)
Anion gap: 13 (ref 5–15)
BUN: 16 mg/dL (ref 8–23)
CO2: 22 mmol/L (ref 22–32)
Calcium: 9.9 mg/dL (ref 8.9–10.3)
Chloride: 102 mmol/L (ref 98–111)
Creatinine: 1.25 mg/dL — ABNORMAL HIGH (ref 0.44–1.00)
GFR, Estimated: 46 mL/min — ABNORMAL LOW (ref >60)
Glucose, Bld: 128 mg/dL — ABNORMAL HIGH (ref 70–99)
Potassium: 3.6 mmol/L (ref 3.5–5.1)
Sodium: 137 mmol/L (ref 135–145)
Total Bilirubin: 0.5 mg/dL (ref 0.3–1.2)
Total Protein: 7.1 g/dL (ref 6.5–8.1)

## 2022-11-15 MED ORDER — TRAMADOL HCL 50 MG PO TABS: 50.0000 mg | ORAL_TABLET | Freq: Four times a day (QID) | ORAL | 0 refills | Status: DC | PRN

## 2022-11-15 MED ORDER — TRAZODONE HCL 50 MG PO TABS: 50.0000 mg | ORAL_TABLET | Freq: Every day | ORAL | 2 refills | Status: DC

## 2022-11-15 MED ORDER — ALPRAZOLAM 0.5 MG PO TABS: 0.5000 mg | ORAL_TABLET | Freq: Two times a day (BID) | ORAL | 0 refills | Status: DC | PRN

## 2022-11-15 MED ORDER — SODIUM CHLORIDE 0.9 % IV SOLN
Freq: Once | INTRAVENOUS | Status: AC
Start: 2022-11-15 — End: 2022-11-15
  Filled 2022-11-15: qty 250

## 2022-11-15 NOTE — Progress Notes (Signed)
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  INTERVAL HISTORY: Patient returns to clinic today for laboratory work and further evaluation.  She has a declining performance status as well as increased weakness and fatigue.  She has a poor appetite.  She also reports intermittent fevers.  Patient discontinued both Afinitor and Aromasin 4 to 5 days ago.  It was recommended that patient pursue medical evaluation which she declined. She continues to have a mild peripheral neuropathy, but no other neurologic complaints.  She denies any chest pain, shortness of breath, cough, or hemoptysis.  She denies any nausea, vomiting, or constipation.  She has no urinary complaints.  Patient feels generally terrible, but offers no further specific complaints.  REVIEW OF SYSTEMS:   Review of Systems  Constitutional:  Positive for fever, malaise/fatigue and weight loss.  HENT:  Negative for congestion.   Respiratory: Negative.  Negative for cough and shortness of breath.   Cardiovascular: Negative.  Negative for chest pain and leg swelling.  Gastrointestinal: Negative.  Negative for abdominal pain, constipation, diarrhea and nausea.  Genitourinary: Negative.  Negative for dysuria, flank pain and urgency.  Musculoskeletal: Negative.  Negative for back pain and joint pain.  Skin: Negative.  Negative for rash.  Neurological:  Positive for tingling, sensory change and  weakness. Negative for dizziness, focal weakness and headaches.  Psychiatric/Behavioral:  The patient is not nervous/anxious.     As per HPI. Otherwise, a complete review of systems is negative.  PAST MEDICAL HISTORY: Past Medical History:  Diagnosis Date   Anxiety    Breast cancer, left (HCC) 07/2017   Depression    Hypertension    Hypothyroidism    Personal history of chemotherapy 2019   LEFT mastectomy-chemo before   Personal history of radiation therapy 03/2018   LEFT mastectomy   Rapid heart rate    Thyroid disease     PAST SURGICAL HISTORY: Past Surgical History:  Procedure Laterality Date   AXILLARY LYMPH NODE BIOPSY Left 07/16/2017   METASTATIC MAMMARY CARCINOMA   BREAST BIOPSY Left 07/16/2017   [place] bx of left breast mass and left breast LN.  INVASIVE MAMMARY CARCINOMA, NO SPECIAL TYPE.    BREAST EXCISIONAL BIOPSY Right 2001   benign   BREAST LUMPECTOMY WITH SENTINEL LYMPH NODE BIOPSY Left 12/06/2017   Procedure: BREAST LUMPECTOMY WITH SENTINEL LYMPH NODE BX;  Surgeon: Earline Mayotte, MD;  Location: [facility] ORS;  Service: General;  Laterality: Left;   COLONOSCOPY     MASTECTOMY Left 12/23/2017   PORTACATH PLACEMENT Right 08/07/2017   Procedure: INSERTION PORT-A-CATH;  Surgeon: Earline Mayotte, MD;  Location: [facility] ORS;  Service: General;  Laterality: Right;   SIMPLE MASTECTOMY WITH AXILLARY SENTINEL NODE BIOPSY Left 12/23/2017   T2,N2 with 6/7 nodes positive. Whole breast radiation.  Surgeon: Earline Mayotte, MD;  Location: [facility] ORS;  Service: General;  Laterality: Left;   TONSILLECTOMY      FAMILY HISTORY: Family History  Problem Relation Age of Onset   Stroke Mother    Thyroid disease  Mother    Renal Disease Mother    Stroke Father    Heart attack Father    Sudden death Father 77       suicide   Anuerysm Brother    Breast cancer Neg Hx     ADVANCED DIRECTIVES (Y/N):  N  HEALTH MAINTENANCE: Social History   Tobacco Use   Smoking status: Former     Current packs/day: 0.00    Average packs/day: 1 pack/day for 30.0 years (30.0 ttl pk-yrs)    Types: Cigarettes    Start date: 71    Quit date: 2019    Years since quitting: 5.8   Smokeless tobacco: Never  Vaping Use   Vaping status: Never Used  Substance Use Topics   Alcohol use: Not Currently   Drug use: Never     Colonoscopy:  PAP:  Bone density:  Lipid panel:  Allergies  Allergen Reactions   Sulfa Antibiotics Diarrhea    Current Outpatient Medications  Medication Sig Dispense Refill   acetaminophen (TYLENOL) 500 MG tablet Take 1,000 mg by mouth every 6 (six) hours as needed for moderate pain or headache.      ALPRAZolam (XANAX) 0.5 MG tablet Take 1 tablet (0.5 mg total) by mouth 2 (two) times daily as needed for anxiety. 10 tablet 0   ascorbic acid (VITAMIN C) 500 MG tablet Take 500 mg by mouth daily.     aspirin EC 81 MG tablet Take 81 mg by mouth at bedtime.      calcium carbonate (OS-CAL - DOSED IN MG OF ELEMENTAL CALCIUM) 1250 (500 Ca) MG tablet Take 1 tablet by mouth daily with breakfast.     Cholecalciferol (VITAMIN D3) 125 MCG (5000 UT) CAPS Take 5,000 Units by mouth 2 (two) times a day.     levothyroxine (SYNTHROID) 100 MCG tablet Take 1 tablet (100 mcg total) by mouth daily before breakfast.     MAGNESIUM CITRATE PO Take by mouth.     Multiple Vitamin (MULTIVITAMIN WITH MINERALS) TABS tablet Take 1 tablet by mouth daily.     ondansetron (ZOFRAN) 8 MG tablet Take 1 tablet (8 mg total) by mouth every 8 (eight) hours as needed. 30 tablet 1   pantoprazole (PROTONIX) 20 MG tablet TAKE 1 TABLET(20 MG) BY MOUTH DAILY 90 tablet 0   Probiotic Product (ALIGN) 4 MG CAPS Take 1 capsule by mouth daily.     prochlorperazine (COMPAZINE) 10 MG tablet Take 1 tablet (10 mg total) by mouth every 6 (six) hours as needed for nausea or vomiting. 30 tablet 2   rosuvastatin (CRESTOR) 5 MG tablet Take 1 tablet (5 mg total) by mouth daily. 90 tablet 3   traMADol (ULTRAM) 50 MG tablet  Take 1 tablet (50 mg total) by mouth every 6 (six) hours as needed. 60 tablet 0   traZODone (DESYREL) 50 MG tablet Take 1 tablet (50 mg total) by mouth at bedtime. 30 tablet 2   Amino Acids-Protein Hydrolys (PRO-STAT SUGAR FREE PO) Take 30 mLs by mouth 2 (two) times daily.     amLODipine (NORVASC) 5 MG tablet Take 1 tablet (5 mg total) by mouth daily. (Patient not taking: Reported on 11/15/2022) 90 tablet 3   everolimus (AFINITOR) 5 MG tablet Take 1 tablet (5 mg total) by mouth daily. (Patient not taking: Reported on 11/15/2022) 30 tablet 0   exemestane (AROMASIN) 25 MG tablet Take 1 tablet (25 mg total) by mouth daily after breakfast. (Patient not taking: Reported on 11/15/2022) 30 tablet  2   hydrochlorothiazide (HYDRODIURIL) 12.5 MG tablet Take 12.5 mg by mouth daily. (Patient not taking: Reported on 10/18/2022)     linaclotide (LINZESS) 145 MCG CAPS capsule Take 1 capsule (145 mcg total) by mouth daily before breakfast. (Patient not taking: Reported on 11/15/2022) 30 capsule 1   loratadine (CLARITIN) 10 MG tablet Take 10 mg by mouth daily. Take to reduce risk of rash from everolimus.     metoprolol tartrate (LOPRESSOR) 25 MG tablet Take 0.5 tablets (12.5 mg total) by mouth 2 (two) times daily. (Patient not taking: Reported on 11/15/2022)     ondansetron (ZOFRAN) 8 MG tablet Take 1 tablet (8 mg total) by mouth every 8 (eight) hours as needed for nausea or vomiting. 20 tablet 2   potassium chloride SA (KLOR-CON M) 20 MEQ tablet Take 1 tablet (20 mEq total) by mouth 2 (two) times daily. 180 tablet 3   Sennosides-Docusate Sodium (SENNA PLUS PO) Take 1 capsule by mouth as needed (constipation). (Patient not taking: Reported on 11/15/2022)     traZODone (DESYREL) 50 MG tablet Take 1 tablet (50 mg total) by mouth at bedtime.     No current facility-administered medications for this visit.    OBJECTIVE: Vitals:   11/15/22 0945  BP: 132/67  Pulse: (!) 110  Temp: 97.7 F (36.5 C)  SpO2: 100%      Body mass index is 28.34 kg/m.    ECOG FS:1 - Symptomatic but completely ambulatory  General: Ill-appearing, no acute distress.  Sitting in a wheelchair. Eyes: Pink conjunctiva, anicteric sclera. HEENT: Normocephalic, moist mucous membranes. Lungs: No audible wheezing or coughing. Heart: Regular rate and rhythm. Abdomen: Soft, nontender, no obvious distention. Musculoskeletal: No edema, cyanosis, or clubbing. Neuro: Alert, answering all questions appropriately. Cranial nerves grossly intact. Skin: No rashes or petechiae noted. Psych: Normal affect.   LAB RESULTS:  Lab Results  Component Value Date   NA 137 11/15/2022   K 3.6 11/15/2022   CL 102 11/15/2022   CO2 22 11/15/2022   GLUCOSE 128 (H) 11/15/2022   BUN 16 11/15/2022   CREATININE 1.25 (H) 11/15/2022   CALCIUM 9.9 11/15/2022   PROT 7.1 11/15/2022   ALBUMIN 3.5 11/15/2022   AST 50 (H) 11/15/2022   ALT 31 11/15/2022   ALKPHOS 87 11/15/2022   BILITOT 0.5 11/15/2022   GFRNONAA 46 (L) 11/15/2022   GFRAA 57 (L) 10/01/2019    Lab Results  Component Value Date   WBC 3.5 (L) 11/15/2022   NEUTROABS 2.7 11/15/2022   HGB 10.3 (L) 11/15/2022   HCT 32.6 (L) 11/15/2022   MCV 82.3 11/15/2022   PLT 217 11/15/2022     STUDIES: ECHOCARDIOGRAM COMPLETE  Result Date: 10/19/2022    ECHOCARDIOGRAM REPORT   Patient Name:   Alicia [name] Date of Exam: 10/19/2022 Medical Rec #:  [id number]         Height:       66.0 in Accession #:    [id number]        Weight:       187.0 lb Date of Birth:  1950-08-03         BSA:          1.943 m Patient Age:    72 years          BP:           110/60 mmHg Patient Gender: F                 HR:  86 bpm. Exam Location:  [facility] Procedure: 2D Echo, Cardiac Doppler and Color Doppler Indications:    R60.0 Lower extremity edema; R07.9* Chest pain, unspecified  History:        Patient has prior history of Echocardiogram examinations, most                 recent 12/03/2019. Aortic Valve Disease,  Arrythmias:Long QT,                 Signs/Symptoms:Chest Pain and Edema; Risk Factors:Former Smoker,                 Hypertension and Dyslipidemia.  Sonographer:    Quentin Ore RDMS, RVT, RDCS Referring Phys: [id number] SHERI HAMMOCK  Sonographer Comments: Extremely limited acoustic windows IMPRESSIONS  1. Left ventricular ejection fraction, by estimation, is 55 to 60%. The left ventricle has normal function. The left ventricle has no regional wall motion abnormalities. Left ventricular diastolic parameters are consistent with Grade I diastolic dysfunction (impaired relaxation).  2. Right ventricular systolic function is normal. The right ventricular size is normal.  3. The mitral valve is normal in structure. No evidence of mitral valve regurgitation.  4. The aortic valve is tricuspid. Aortic valve regurgitation is mild.  5. The inferior vena cava is normal in size with greater than 50% respiratory variability, suggesting right atrial pressure of 3 mmHg. FINDINGS  Left Ventricle: Left ventricular ejection fraction, by estimation, is 55 to 60%. The left ventricle has normal function. The left ventricle has no regional wall motion abnormalities. The left ventricular internal cavity size was normal in size. There is  no left ventricular hypertrophy. Left ventricular diastolic parameters are consistent with Grade I diastolic dysfunction (impaired relaxation). Right Ventricle: The right ventricular size is normal. No increase in right ventricular wall thickness. Right ventricular systolic function is normal. Left Atrium: Left atrial size was normal in size. Right Atrium: Right atrial size was normal in size. Pericardium: There is no evidence of pericardial effusion. Mitral Valve: The mitral valve is normal in structure. No evidence of mitral valve regurgitation. Tricuspid Valve: The tricuspid valve is normal in structure. Tricuspid valve regurgitation is trivial. Aortic Valve: The aortic valve is tricuspid. Aortic valve  regurgitation is mild. Aortic regurgitation PHT measures 504 msec. Aortic valve mean gradient measures 6.0 mmHg. Aortic valve peak gradient measures 10.8 mmHg. Aortic valve area, by VTI measures 2.78 cm. Pulmonic Valve: The pulmonic valve was normal in structure. Pulmonic valve regurgitation is not visualized. Aorta: The aortic root is normal in size and structure. Venous: The inferior vena cava is normal in size with greater than 50% respiratory variability, suggesting right atrial pressure of 3 mmHg. IAS/Shunts: No atrial level shunt detected by color flow Doppler.  LEFT VENTRICLE PLAX 2D LVIDd:         5.40 cm     Diastology LVIDs:         3.60 cm     LV e' medial:    10.20 cm/s LV PW:         0.60 cm     LV E/e' medial:  6.5 LV IVS:        0.70 cm     LV e' lateral:   9.79 cm/s LVOT diam:     2.10 cm     LV E/e' lateral: 6.8 LV SV:         87 LV SV Index:   45 LVOT Area:     3.46 cm  LV Volumes (  MOD) LV vol d, MOD A2C: 66.9 ml LV vol d, MOD A4C: 91.6 ml LV vol s, MOD A2C: 29.3 ml LV vol s, MOD A4C: 41.8 ml LV SV MOD A2C:     37.6 ml LV SV MOD A4C:     91.6 ml LV SV MOD BP:      42.7 ml RIGHT VENTRICLE             IVC RV Basal diam:  3.50 cm     IVC diam: 1.10 cm RV S prime:     16.40 cm/s TAPSE (M-mode): 2.9 cm LEFT ATRIUM             Index        RIGHT ATRIUM           Index LA diam:        4.70 cm 2.42 cm/m   RA Area:     14.90 cm LA Vol (A2C):   65.9 ml 33.91 ml/m  RA Volume:   33.00 ml  16.98 ml/m LA Vol (A4C):   55.8 ml 28.71 ml/m LA Biplane Vol: 63.0 ml 32.42 ml/m  AORTIC VALVE                     PULMONIC VALVE AV Area (Vmax):    2.84 cm      PV Vmax:       1.20 m/s AV Area (Vmean):   2.76 cm      PV Peak grad:  5.8 mmHg AV Area (VTI):     2.78 cm AV Vmax:           164.50 cm/s AV Vmean:          112.000 cm/s AV VTI:            0.311 m AV Peak Grad:      10.8 mmHg AV Mean Grad:      6.0 mmHg LVOT Vmax:         135.00 cm/s LVOT Vmean:        89.350 cm/s LVOT VTI:          0.250 m LVOT/AV VTI  ratio: 0.80 AI PHT:            504 msec  AORTA Ao Root diam: 2.80 cm Ao Arch diam: 2.3 cm MITRAL VALVE MV Area (PHT): 3.91 cm    SHUNTS MV Decel Time: 194 msec    Systemic VTI:  0.25 m MV E velocity: 66.10 cm/s  Systemic Diam: 2.10 cm MV A velocity: 88.30 cm/s MV E/A ratio:  0.75 Debbe Odea MD Electronically signed by Debbe Odea MD Signature Date/Time: 10/19/2022/4:41:52 PM    Final      ONCOLOGY HISTORY: Patient initially received neoadjuvant chemotherapy with Adriamycin and Cytoxan followed by weekly Taxol. She only received 4 cycles of weekly Taxol prior to discontinuation of treatment on October 31, 2017 secondary to persistent peripheral neuropathy.  She ultimately required mastectomy and final pathology noted 5 of 6 lymph nodes positive for disease.  Patient completed adjuvant XRT in mid April 2020.  Pain initiated letrozole in May 2020, this was discontinued secondary to side effects and patient was started on anastrozole.  Nuclear medicine bone scan on June 19, 2019 revealed metastatic disease and anastrozole was subsequently discontinued.  PET scan results from October 02, 2021 revealed no clear evidence of metastatic progression.  PET scan results from June 30, 2019 reviewed independently with metastatic bony disease, but no obvious evidence of  visceral disease.  MRI of the brain on August 24, 2019 reviewed independently with no obvious evidence of metastatic disease.   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  Patient's CA 27-29 has increased to 187.1, but she also has been taking treatment intermittently secondary to declining performance status.  Continue to hold Afinitor 5 mg, patient has been instructed to reinitiate Aromasin. [name] has been permanently discontinued given her issues with her  mandible.  Will get restaging CT scan next week.  Patient will then return to clinic in 2 weeks for further discussion on whether or not to reinitiate treatment.  Appreciate clinical pharmacy input.   Creased performance status/poor appetite: Proceed with IV fluids today.   Constipation: Resolved. Peripheral neuropathy: Chronic and unchanged. History of pulmonary embolus: Patient was diagnosed with a small pulmonary embolus on January 10, 2018.  She is no longer on anticoagulation.  There was no evidence of recurrent PE on CT scan from October 27, 2019. Osteopenia: Patient's most recent bone mineral density on March 03, 2019 reported a T score of -1.8 which is only mildly decreased from 1 year prior when the reported T score was -1.6.  Continue calcium and vitamin D supplementation.  Patient no longer will receive bisphosphonates.   Left hip/flank pain: Patient does not complain of pain today.  Continue tramadol as prescribed.  She has now completed XRT.  Patient was given a refill of tramadol today. Anemia: Chronic and unchanged.  Patient's hemoglobin is 10.3 today. Leukopenia: Mild, monitor. Renal insufficiency: Mild.  Patient's creatinine is 1.25 today.  IV fluids as above.   Hypokalemia: Resolved.  Continue oral potassium supplementation. Dental pathology: Significantly improved.  Continue follow up with Dr. Willaim Bane.  Patient has discontinued hyperbaric treatments at [facility]. Lymphedema: Follow-up with occupational therapy as scheduled. Coping/anxiety: Continue trazodone at night.  Patient was also given a referral to [facility].  I spent a total of 30 minutes reviewing chart data, face-to-face evaluation with the patient, counseling and coordination of care as detailed above.    Patient expressed understanding and was in agreement with this plan. She also understands that She can call clinic at any time with any questions, concerns, or complaints.    Cancer Staging  Breast  cancer, stage 2, left (HCC) Staging form: Breast, AJCC 8th Edition - Clinical stage from 07/30/2017: Stage IIA (cT2, cN1, cM0, G2, ER+, PR+, HER2-) - Signed by Jeralyn Ruths, MD on 07/30/2017 Histologic grading system: 3 grade system Laterality: Left   Jeralyn Ruths, MD   11/15/2022 10:02 AM

## 2022-11-16 ENCOUNTER — Encounter: Payer: Self-pay | Admitting: Oncology

## 2022-11-16 LAB — CANCER ANTIGEN 27.29: CA 27.29: 187.1 U/mL — ABNORMAL HIGH (ref 0.0–38.6)

## 2022-11-20 ENCOUNTER — Ambulatory Visit: Payer: No Typology Code available for payment source

## 2022-11-21 ENCOUNTER — Ambulatory Visit
Admission: RE | Admit: 2022-11-21 | Discharge: 2022-11-21 | Disposition: A | Discharge status: Home / Self Care | Payer: Medicare Other | Source: Ambulatory Visit | Attending: Oncology | Admitting: Oncology

## 2022-11-21 DIAGNOSIS — J439 Emphysema, unspecified: Secondary | ICD-10-CM | POA: Diagnosis not present

## 2022-11-21 DIAGNOSIS — C7951 Secondary malignant neoplasm of bone: Secondary | ICD-10-CM | POA: Diagnosis not present

## 2022-11-21 DIAGNOSIS — J189 Pneumonia, unspecified organism: Secondary | ICD-10-CM | POA: Diagnosis not present

## 2022-11-21 DIAGNOSIS — C50912 Malignant neoplasm of unspecified site of left female breast: Secondary | ICD-10-CM | POA: Diagnosis not present

## 2022-11-21 MED ORDER — IOHEXOL 300 MG/ML  SOLN
80.0000 mL | Freq: Once | INTRAMUSCULAR | Status: AC | PRN
  Administered 2022-11-21: 80 mL via INTRAVENOUS

## 2022-11-22 ENCOUNTER — Ambulatory Visit: Payer: No Typology Code available for payment source

## 2022-11-23 ENCOUNTER — Encounter: Payer: Self-pay | Admitting: Oncology

## 2022-11-26 ENCOUNTER — Emergency Department: Payer: Medicare Other

## 2022-11-26 ENCOUNTER — Other Ambulatory Visit: Payer: Self-pay

## 2022-11-26 ENCOUNTER — Inpatient Hospital Stay: Payer: Medicare Other

## 2022-11-26 ENCOUNTER — Inpatient Hospital Stay
Admission: EM | Admit: 2022-11-26 | Discharge: 2022-12-03 | DRG: 193 | Disposition: A | Discharge status: Home Health | Payer: Medicare Other | Attending: Internal Medicine | Admitting: Internal Medicine

## 2022-11-26 ENCOUNTER — Encounter: Payer: Self-pay | Admitting: Emergency Medicine

## 2022-11-26 DIAGNOSIS — E876 Hypokalemia: Secondary | ICD-10-CM | POA: Diagnosis not present

## 2022-11-26 DIAGNOSIS — Z79890 Hormone replacement therapy (postmenopausal): Secondary | ICD-10-CM

## 2022-11-26 DIAGNOSIS — F419 Anxiety disorder, unspecified: Secondary | ICD-10-CM | POA: Diagnosis not present

## 2022-11-26 DIAGNOSIS — I2699 Other pulmonary embolism without acute cor pulmonale: Secondary | ICD-10-CM | POA: Diagnosis not present

## 2022-11-26 DIAGNOSIS — E878 Other disorders of electrolyte and fluid balance, not elsewhere classified: Secondary | ICD-10-CM | POA: Diagnosis present

## 2022-11-26 DIAGNOSIS — C801 Malignant (primary) neoplasm, unspecified: Secondary | ICD-10-CM | POA: Diagnosis not present

## 2022-11-26 DIAGNOSIS — R6520 Severe sepsis without septic shock: Secondary | ICD-10-CM

## 2022-11-26 DIAGNOSIS — A419 Sepsis, unspecified organism: Secondary | ICD-10-CM

## 2022-11-26 DIAGNOSIS — I441 Atrioventricular block, second degree: Secondary | ICD-10-CM | POA: Diagnosis present

## 2022-11-26 DIAGNOSIS — J9601 Acute respiratory failure with hypoxia: Secondary | ICD-10-CM | POA: Diagnosis not present

## 2022-11-26 DIAGNOSIS — E7800 Pure hypercholesterolemia, unspecified: Secondary | ICD-10-CM | POA: Diagnosis present

## 2022-11-26 DIAGNOSIS — Z1721 Progesterone receptor positive status: Secondary | ICD-10-CM

## 2022-11-26 DIAGNOSIS — D849 Immunodeficiency, unspecified: Secondary | ICD-10-CM | POA: Diagnosis present

## 2022-11-26 DIAGNOSIS — Z79899 Other long term (current) drug therapy: Secondary | ICD-10-CM

## 2022-11-26 DIAGNOSIS — Z8349 Family history of other endocrine, nutritional and metabolic diseases: Secondary | ICD-10-CM

## 2022-11-26 DIAGNOSIS — R918 Other nonspecific abnormal finding of lung field: Secondary | ICD-10-CM | POA: Diagnosis not present

## 2022-11-26 DIAGNOSIS — C50912 Malignant neoplasm of unspecified site of left female breast: Secondary | ICD-10-CM | POA: Diagnosis present

## 2022-11-26 DIAGNOSIS — J189 Pneumonia, unspecified organism: Principal | ICD-10-CM | POA: Diagnosis present

## 2022-11-26 DIAGNOSIS — R519 Headache, unspecified: Secondary | ICD-10-CM | POA: Diagnosis not present

## 2022-11-26 DIAGNOSIS — Z86718 Personal history of other venous thrombosis and embolism: Secondary | ICD-10-CM

## 2022-11-26 DIAGNOSIS — E872 Acidosis, unspecified: Secondary | ICD-10-CM | POA: Diagnosis present

## 2022-11-26 DIAGNOSIS — I2489 Other forms of acute ischemic heart disease: Secondary | ICD-10-CM | POA: Diagnosis present

## 2022-11-26 DIAGNOSIS — Z1152 Encounter for screening for COVID-19: Secondary | ICD-10-CM

## 2022-11-26 DIAGNOSIS — Z923 Personal history of irradiation: Secondary | ICD-10-CM

## 2022-11-26 DIAGNOSIS — J479 Bronchiectasis, uncomplicated: Secondary | ICD-10-CM | POA: Diagnosis not present

## 2022-11-26 DIAGNOSIS — Z823 Family history of stroke: Secondary | ICD-10-CM

## 2022-11-26 DIAGNOSIS — Z1732 Human epidermal growth factor receptor 2 negative status: Secondary | ICD-10-CM

## 2022-11-26 DIAGNOSIS — Z170 Estrogen receptor positive status [ER+]: Secondary | ICD-10-CM

## 2022-11-26 DIAGNOSIS — R7989 Other specified abnormal findings of blood chemistry: Secondary | ICD-10-CM | POA: Diagnosis present

## 2022-11-26 DIAGNOSIS — R6510 Systemic inflammatory response syndrome (SIRS) of non-infectious origin without acute organ dysfunction: Secondary | ICD-10-CM | POA: Diagnosis present

## 2022-11-26 DIAGNOSIS — N1831 Chronic kidney disease, stage 3a: Secondary | ICD-10-CM | POA: Diagnosis present

## 2022-11-26 DIAGNOSIS — Z882 Allergy status to sulfonamides status: Secondary | ICD-10-CM

## 2022-11-26 DIAGNOSIS — Z9221 Personal history of antineoplastic chemotherapy: Secondary | ICD-10-CM

## 2022-11-26 DIAGNOSIS — C7951 Secondary malignant neoplasm of bone: Secondary | ICD-10-CM | POA: Diagnosis present

## 2022-11-26 DIAGNOSIS — E039 Hypothyroidism, unspecified: Secondary | ICD-10-CM | POA: Diagnosis present

## 2022-11-26 DIAGNOSIS — Z841 Family history of disorders of kidney and ureter: Secondary | ICD-10-CM

## 2022-11-26 DIAGNOSIS — Z87891 Personal history of nicotine dependence: Secondary | ICD-10-CM | POA: Diagnosis not present

## 2022-11-26 DIAGNOSIS — K219 Gastro-esophageal reflux disease without esophagitis: Secondary | ICD-10-CM | POA: Diagnosis present

## 2022-11-26 DIAGNOSIS — I10 Essential (primary) hypertension: Secondary | ICD-10-CM | POA: Diagnosis present

## 2022-11-26 DIAGNOSIS — Z8249 Family history of ischemic heart disease and other diseases of the circulatory system: Secondary | ICD-10-CM | POA: Diagnosis not present

## 2022-11-26 DIAGNOSIS — N179 Acute kidney failure, unspecified: Secondary | ICD-10-CM | POA: Diagnosis present

## 2022-11-26 DIAGNOSIS — R9082 White matter disease, unspecified: Secondary | ICD-10-CM | POA: Diagnosis not present

## 2022-11-26 DIAGNOSIS — I951 Orthostatic hypotension: Secondary | ICD-10-CM | POA: Diagnosis present

## 2022-11-26 DIAGNOSIS — Z9012 Acquired absence of left breast and nipple: Secondary | ICD-10-CM | POA: Diagnosis not present

## 2022-11-26 DIAGNOSIS — I129 Hypertensive chronic kidney disease with stage 1 through stage 4 chronic kidney disease, or unspecified chronic kidney disease: Secondary | ICD-10-CM | POA: Diagnosis present

## 2022-11-26 DIAGNOSIS — Z853 Personal history of malignant neoplasm of breast: Secondary | ICD-10-CM

## 2022-11-26 DIAGNOSIS — R0602 Shortness of breath: Secondary | ICD-10-CM | POA: Diagnosis not present

## 2022-11-26 HISTORY — DX: Acute respiratory failure with hypoxia: J96.01

## 2022-11-26 HISTORY — DX: Hypercalcemia: E83.52

## 2022-11-26 LAB — PROTIME-INR
INR: 1.1 (ref 0.8–1.2)
Prothrombin Time: 14.5 s (ref 11.4–15.2)

## 2022-11-26 LAB — CBC
HCT: 35 % — ABNORMAL LOW (ref 36.0–46.0)
Hemoglobin: 11 g/dL — ABNORMAL LOW (ref 12.0–15.0)
MCH: 26.4 pg (ref 26.0–34.0)
MCHC: 31.4 g/dL (ref 30.0–36.0)
MCV: 83.9 fL (ref 80.0–100.0)
Platelets: 371 10*3/uL (ref 150–400)
RBC: 4.17 MIL/uL (ref 3.87–5.11)
RDW: 19.5 % — ABNORMAL HIGH (ref 11.5–15.5)
WBC: 7 10*3/uL (ref 4.0–10.5)
nRBC: 0 % (ref 0.0–0.2)

## 2022-11-26 LAB — URINALYSIS, ROUTINE W REFLEX MICROSCOPIC
Bilirubin Urine: NEGATIVE
Glucose, UA: NEGATIVE mg/dL
Hgb urine dipstick: NEGATIVE
Ketones, ur: NEGATIVE mg/dL
Nitrite: NEGATIVE
Protein, ur: NEGATIVE mg/dL
Specific Gravity, Urine: 1.016 (ref 1.005–1.030)
pH: 5 (ref 5.0–8.0)

## 2022-11-26 LAB — HEPATIC FUNCTION PANEL
ALT: 19 U/L (ref 0–44)
AST: 32 U/L (ref 15–41)
Albumin: 3.6 g/dL (ref 3.5–5.0)
Alkaline Phosphatase: 108 U/L (ref 38–126)
Bilirubin, Direct: 0.1 mg/dL (ref 0.0–0.2)
Indirect Bilirubin: 0.8 mg/dL (ref 0.3–0.9)
Total Bilirubin: 0.9 mg/dL (ref <1.2)
Total Protein: 6.9 g/dL (ref 6.5–8.1)

## 2022-11-26 LAB — BASIC METABOLIC PANEL
Anion gap: 10 (ref 5–15)
BUN: 27 mg/dL — ABNORMAL HIGH (ref 8–23)
CO2: 29 mmol/L (ref 22–32)
Calcium: >15 mg/dL (ref 8.9–10.3)
Chloride: 98 mmol/L (ref 98–111)
Creatinine, Ser: 2.76 mg/dL — ABNORMAL HIGH (ref 0.44–1.00)
GFR, Estimated: 18 mL/min — ABNORMAL LOW (ref >60)
Glucose, Bld: 127 mg/dL — ABNORMAL HIGH (ref 70–99)
Potassium: 4.3 mmol/L (ref 3.5–5.1)
Sodium: 137 mmol/L (ref 135–145)

## 2022-11-26 LAB — TROPONIN I (HIGH SENSITIVITY)
Troponin I (High Sensitivity): 48 ng/L — ABNORMAL HIGH (ref <18)
Troponin I (High Sensitivity): 44 ng/L — ABNORMAL HIGH

## 2022-11-26 LAB — LACTIC ACID, PLASMA
Lactic Acid, Venous: 1.9 mmol/L (ref 0.5–1.9)
Lactic Acid, Venous: 2.4 mmol/L (ref 0.5–1.9)
Lactic Acid, Venous: 1.7 mmol/L (ref 0.5–1.9)
Lactic Acid, Venous: 2.4 mmol/L (ref 0.5–1.9)

## 2022-11-26 LAB — APTT: aPTT: 26 s (ref 24–36)

## 2022-11-26 LAB — D-DIMER, QUANTITATIVE: D-Dimer, Quant: 2.41 ug{FEU}/mL — ABNORMAL HIGH (ref 0.00–0.50)

## 2022-11-26 LAB — CALCIUM: Calcium: 14.9 mg/dL (ref 8.9–10.3)

## 2022-11-26 LAB — PROCALCITONIN: Procalcitonin: 0.23 ng/mL

## 2022-11-26 MED ORDER — LEVOTHYROXINE SODIUM 100 MCG PO TABS
100.0000 ug | ORAL_TABLET | Freq: Every day | ORAL | Status: DC
  Administered 2022-11-27 – 2022-12-03 (×7): 100 ug via ORAL
  Filled 2022-11-26 (×7): qty 1

## 2022-11-26 MED ORDER — ONDANSETRON HCL 4 MG PO TABS: 4.0000 mg | ORAL_TABLET | Freq: Four times a day (QID) | ORAL | Status: AC | PRN

## 2022-11-26 MED ORDER — ZOLEDRONIC ACID 4 MG/100ML IV SOLN
4.0000 mg | Freq: Once | INTRAVENOUS | Status: DC
  Filled 2022-11-26: qty 100

## 2022-11-26 MED ORDER — SODIUM CHLORIDE 0.9 % IV SOLN
2.0000 g | INTRAVENOUS | Status: DC
  Administered 2022-11-27 – 2022-11-30 (×4): 2 g via INTRAVENOUS
  Filled 2022-11-26 (×4): qty 12.5

## 2022-11-26 MED ORDER — TRAZODONE HCL 50 MG PO TABS
50.0000 mg | ORAL_TABLET | Freq: Every day | ORAL | Status: DC
  Administered 2022-11-26 – 2022-12-02 (×7): 50 mg via ORAL
  Filled 2022-11-26 (×7): qty 1

## 2022-11-26 MED ORDER — HYDRALAZINE HCL 20 MG/ML IJ SOLN: 5.0000 mg | Freq: Four times a day (QID) | INTRAMUSCULAR | Status: DC | PRN

## 2022-11-26 MED ORDER — HEPARIN BOLUS VIA INFUSION
4400.0000 [IU] | Freq: Once | INTRAVENOUS | Status: AC
Start: 2022-11-26 — End: 2022-11-26
  Administered 2022-11-26: 4400 [IU] via INTRAVENOUS
  Filled 2022-11-26: qty 4400

## 2022-11-26 MED ORDER — HEPARIN (PORCINE) 25000 UT/250ML-% IV SOLN
1000.0000 [IU]/h | INTRAVENOUS | Status: DC
  Administered 2022-11-26: 1150 [IU]/h via INTRAVENOUS
  Administered 2022-11-27: 1000 [IU]/h via INTRAVENOUS
  Filled 2022-11-26 (×2): qty 250

## 2022-11-26 MED ORDER — LORAZEPAM 2 MG/ML IJ SOLN
0.5000 mg | INTRAMUSCULAR | Status: DC | PRN
  Administered 2022-11-26: 0.5 mg via INTRAVENOUS
  Filled 2022-11-26: qty 1

## 2022-11-26 MED ORDER — VANCOMYCIN VARIABLE DOSE PER UNSTABLE RENAL FUNCTION (PHARMACIST DOSING): Status: DC

## 2022-11-26 MED ORDER — ONDANSETRON HCL 4 MG/2ML IJ SOLN
4.0000 mg | Freq: Four times a day (QID) | INTRAMUSCULAR | Status: AC | PRN
  Administered 2022-11-27 – 2022-12-01 (×2): 4 mg via INTRAVENOUS
  Filled 2022-11-26 (×2): qty 2

## 2022-11-26 MED ORDER — VITAMIN D3 25 MCG (1000 UNIT) PO TABS
5000.0000 [IU] | ORAL_TABLET | Freq: Every day | ORAL | Status: DC
  Administered 2022-11-27 – 2022-12-02 (×6): 5000 [IU] via ORAL
  Filled 2022-11-26 (×13): qty 5

## 2022-11-26 MED ORDER — DEXTROSE 5 % IV SOLN
500.0000 mg | INTRAVENOUS | Status: AC
  Administered 2022-11-26 – 2022-11-30 (×5): 500 mg via INTRAVENOUS
  Filled 2022-11-26 (×5): qty 5

## 2022-11-26 MED ORDER — ADULT MULTIVITAMIN W/MINERALS CH
1.0000 | ORAL_TABLET | Freq: Every day | ORAL | Status: DC
  Administered 2022-11-27 – 2022-12-03 (×7): 1 via ORAL
  Filled 2022-11-26 (×7): qty 1

## 2022-11-26 MED ORDER — SENNOSIDES-DOCUSATE SODIUM 8.6-50 MG PO TABS: 1.0000 | ORAL_TABLET | Freq: Every evening | ORAL | Status: DC | PRN

## 2022-11-26 MED ORDER — HEPARIN SODIUM (PORCINE) 5000 UNIT/ML IJ SOLN: 5000.0000 [IU] | Freq: Three times a day (TID) | INTRAMUSCULAR | Status: DC

## 2022-11-26 MED ORDER — CALCIUM CARBONATE 1250 (500 CA) MG PO TABS
1.0000 | ORAL_TABLET | Freq: Every day | ORAL | Status: DC
  Administered 2022-11-28: 1250 mg via ORAL
  Filled 2022-11-26: qty 1

## 2022-11-26 MED ORDER — ACETAMINOPHEN 325 MG PO TABS
650.0000 mg | ORAL_TABLET | Freq: Four times a day (QID) | ORAL | Status: AC | PRN
Start: 2022-11-26 — End: 2022-12-01
  Administered 2022-11-28 – 2022-12-01 (×4): 650 mg via ORAL
  Filled 2022-11-26 (×5): qty 2

## 2022-11-26 MED ORDER — ROSUVASTATIN CALCIUM 5 MG PO TABS
5.0000 mg | ORAL_TABLET | Freq: Every day | ORAL | Status: DC
  Administered 2022-11-27 – 2022-12-03 (×7): 5 mg via ORAL
  Filled 2022-11-26 (×7): qty 1

## 2022-11-26 MED ORDER — SODIUM CHLORIDE 0.9 % IV SOLN
4.0000 mg | Freq: Once | INTRAVENOUS | Status: AC
  Administered 2022-11-26: 4 mg via INTRAVENOUS
  Filled 2022-11-26: qty 5

## 2022-11-26 MED ORDER — ACETAMINOPHEN 650 MG RE SUPP: 650.0000 mg | Freq: Four times a day (QID) | RECTAL | Status: AC | PRN

## 2022-11-26 MED ORDER — SODIUM CHLORIDE 0.9 % IV SOLN
2.0000 g | Freq: Once | INTRAVENOUS | Status: AC
  Administered 2022-11-26: 2 g via INTRAVENOUS
  Filled 2022-11-26: qty 12.5

## 2022-11-26 MED ORDER — SENNOSIDES-DOCUSATE SODIUM 8.6-50 MG PO TABS
1.0000 | ORAL_TABLET | Freq: Every evening | ORAL | Status: DC | PRN
Start: 2022-11-26 — End: 2022-11-26

## 2022-11-26 MED ORDER — CALCITONIN (SALMON) 200 UNIT/ML IJ SOLN
4.0000 [IU]/kg | Freq: Two times a day (BID) | INTRAMUSCULAR | Status: AC
  Administered 2022-11-26 – 2022-11-27 (×2): 300 [IU] via SUBCUTANEOUS
  Filled 2022-11-26 (×2): qty 1.5

## 2022-11-26 MED ORDER — SODIUM CHLORIDE 0.9 % IV SOLN: INTRAVENOUS | Status: AC

## 2022-11-26 MED ORDER — SODIUM CHLORIDE 0.9 % IV BOLUS
500.0000 mL | Freq: Once | INTRAVENOUS | Status: AC
  Administered 2022-11-26: 500 mL via INTRAVENOUS

## 2022-11-26 MED ORDER — SODIUM CHLORIDE 0.9 % IV BOLUS
1000.0000 mL | Freq: Once | INTRAVENOUS | Status: AC
  Administered 2022-11-26: 1000 mL via INTRAVENOUS

## 2022-11-26 MED ORDER — VANCOMYCIN HCL 1500 MG/300ML IV SOLN
1500.0000 mg | Freq: Once | INTRAVENOUS | Status: AC
  Administered 2022-11-26: 1500 mg via INTRAVENOUS
  Filled 2022-11-26: qty 300

## 2022-11-26 MED ORDER — VITAMIN C 500 MG PO TABS
500.0000 mg | ORAL_TABLET | Freq: Two times a day (BID) | ORAL | Status: DC
  Administered 2022-11-27 – 2022-12-03 (×13): 500 mg via ORAL
  Filled 2022-11-26 (×13): qty 1

## 2022-11-26 NOTE — Assessment & Plan Note (Signed)
-   Rosuvastatin 5 mg daily resumed 

## 2022-11-26 NOTE — ED Notes (Signed)
Lab at bedside

## 2022-11-26 NOTE — ED Notes (Signed)
Pt presents to ED with c/o of feeling SOB and weak. Pt states she is currently being treated for CA and is taking oral chemo pills. Pt states loss of appetite and that she "just can't eat what she used too". Pt denies any pain at this time. Pt endorses she does not wear O2 at home but was placed on 2L/min in triage due to a low O2 sat. Pt states SOB is worse with exertion. Pt is A&Ox4 at this time.

## 2022-11-26 NOTE — ED Notes (Signed)
PIV placed with no blood return noted but flushed with out difficulty, lab called to obtain LA.

## 2022-11-26 NOTE — H&P (Addendum)
History and Physical   [name] WUX:[id number] DOB: 03/10/1950 DOA: 11/26/2022  PCP: Corky Downs, MD  Outpatient Specialists: [name], medical oncology Patient coming from: Home  I have personally briefly reviewed patient's old medical records in [facility] EMR.  Chief Concern: Shortness of breath, hypoxia  HPI: Ms. 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  Per EDP, patient was hypoxic to 72% on room air with no baseline O2 requirements  Vitals in the ED showed temperature of 97.9, respiration rate of 18, heart rate of 93, blood pressure 122/81, SpO2 of 92% on 2 L nasal cannula.  Serum sodium is 137, potassium 4.3, chloride 98, bicarb 29, BUN of 27, serum creatinine of 2.76, EGFR of 18, nonfasting blood glucose 127, WBC 7.0, hemoglobin 11, platelets of 371.  Serum calcium level was greater than 15.  Lactic acid was 1.9 and on repeat was 2.4.  ED treatment: Sodium chloride 500 mL bolus. ---------------------------------- At bedside, patient was able to tell me her name, age, location, current calendar year.  Daughter-in-law reports that she checked her mother-in-law's vitals every morning and noticed that her oxygen was 72% on room air.  Patient does not require baseline O2 supplementation.  Patient endorses a new headache. There is reported no fever.  Patient denies chest pain, abdominal pain, dysuria, hematuria, diarrhea, syncope.  Patient endorses shortness of breath that has been ongoing for several months now.  Patient states is worsening.  Daughter-in-law confirms any worsening over the last day, and that she seems to be struggling  more.  Daughter-in-law reports that this is similar to presentation in July however patient is struggling more and is weaker.  And there is the hypoxia.  Social history: Patient lives at home with her son and daughter-in-law. She denies tobacco, EtOH, recreational drug use.  ROS: Constitutional: no weight change, no fever ENT/Mouth: no sore throat, no rhinorrhea Eyes: no eye pain, no vision changes Cardiovascular: no chest pain, + dyspnea,  no edema, no palpitations Respiratory: no cough, no sputum, no wheezing Gastrointestinal: no nausea, no vomiting, no diarrhea, no constipation Genitourinary: no urinary incontinence, no dysuria, no hematuria Musculoskeletal: no arthralgias, no myalgias Skin: no skin lesions, no pruritus, Neuro: + weakness, no loss of consciousness, no syncope, + headache Psych: no anxiety, no depression, + decrease appetite Heme/Lymph: no bruising, no bleeding  ED Course: Discussed with EDP, patient requiring hospitalization for chief concerns of acute hypoxic respiratory failure.  Assessment/Plan  Principal Problem:   Hypercalcemia Active Problems:   Elevated troponin   AKI (acute kidney injury) (HCC)   Acute hypoxic respiratory failure (HCC)   Severe sepsis (HCC)   Breast cancer, stage 2, left (HCC)   Malignant neoplasm metastatic to bone (HCC)   Electrolyte abnormality   Primary hypertension   Anxiety   Gastroesophageal reflux disease without esophagitis   Mobitz type 1 second degree AV block   Pure hypercholesterolemia   Assessment and Plan:  * Hypercalcemia Status post sodium chloride 500 mL bolus per EDP Obtain ionized calcium and vitamin D level prior to medication administration, discussed with nursing Continue with sodium chloride infusion at 125 mL/h, 1 day ordered Calcitonin 4 units/kg dosing, twice daily, 2 doses ordered Zoledronic acid 4 mg IV infuse  over 120 minutes, 1 dose ordered Check sCa2+ every 4 hours, 3 labs check  ordered Nephrology has been consulted via secure chat and Epic order Admit to PCU, inpatient  Severe sepsis (HCC) Elevated lactic acid, new hypoxia requiring O2 supplementation, immunocompromise Given patient is immunocompromised in setting of palliative chemotherapy Will get blood cultures x 2, broad-spectrum antibiotics with cefepime and vancomycin Sodium chloride 1 L bolus ordered; change sodium chloride infusion from 125 mL to 150 mL/h, 1 day ordered Discussed with nursing via secure chat Continue admission to PCU, inpatient  Addendum: CT chest without contrast: Was read by radiology showing multifocal groundglass and airspace opacity in bilateral lower lobes, findings concerning for multifocal pneumonia.  COVID-19 can have this appearance.  COVID check via PCR ordered. Added azithromycin 500 mg IV, to complete 5-day course for atypical pneumonia  Acute hypoxic respiratory failure (HCC) Etiology work up in progress, suspect secondary to PE versus pneumonia Check procalcitonin, D-dimer and high sensitive troponin CTA PE was considered however given patient's acute kidney injury, contrast has been deferred on admission CT chest without contrast has been ordered by EDP and pending completion Admit to PCU, inpatient  Addendum: Procalcitonin is not elevated, at 0.23  AKI (acute kidney injury) (HCC) On CKD 3A at baseline Strict I's and O's Status post sodium chloride 500 mL bolus per EDP; ordered sodium chloride infusion at 125 mL/h, 1 day ordered Nephrology has been consulted Recheck BMP in the a.m.  Elevated troponin Suspect secondary to demand ischemia in setting of acute hypoxic respiratory failure Low clinical suspicion for ACS as patient troponin was 48 and on repeat is 44 Treat hypoxia as above  Breast cancer, stage 2, left (HCC) Continue outpatient follow-up with medical oncology as appropriate  Primary hypertension Home antihypertensive medication not resumed on  admission Daughter-in-law has not given patient antihypertensive medications today as she noted that patient's blood pressure was on the low normal side AM team to resume home antihypertensive medication when the benefits outweigh the risk Hydralazine 5 mg IV q6h as needed for SBP > 180, 4 days ordered  Pure hypercholesterolemia Rosuvastatin 5 mg daily resumed  Anxiety Lorazepam 0.5 mg IV as needed for anxiety, can be given before MRI, 2 doses ordered  Chart reviewed.   DVT prophylaxis: Heparin 5000 units subcutaneous every 8 hours Code Status: full code Diet: Heart healthy Family Communication: updated son Alicia [name]) and daughter-in-law Alicia [name]) at bedside Disposition Plan: Pending clinical course, guarded prognosis Consults called: Nephrology Admission status: PCU, inpatient  Past Medical History:  Diagnosis Date   Anxiety    Breast cancer, left (HCC) 07/2017   Depression    Hypertension    Hypothyroidism    Personal history of chemotherapy 2019   LEFT mastectomy-chemo before   Personal history of radiation therapy 03/2018   LEFT mastectomy   Rapid heart rate    Thyroid disease    Past Surgical History:  Procedure Laterality Date   AXILLARY LYMPH NODE BIOPSY Left 07/16/2017   METASTATIC MAMMARY CARCINOMA   BREAST BIOPSY Left 07/16/2017   [place] bx of left breast mass and left breast LN.  INVASIVE MAMMARY CARCINOMA, NO SPECIAL TYPE.    BREAST EXCISIONAL BIOPSY Right 2001   benign   BREAST LUMPECTOMY WITH SENTINEL LYMPH NODE BIOPSY Left 12/06/2017   Procedure: BREAST LUMPECTOMY WITH SENTINEL LYMPH NODE BX;  Surgeon: Earline Mayotte, MD;  Location: [facility] ORS;  Service: General;  Laterality: Left;   COLONOSCOPY     MASTECTOMY Left 12/23/2017  PORTACATH PLACEMENT Right 08/07/2017   Procedure: INSERTION PORT-A-CATH;  Surgeon: Earline Mayotte, MD;  Location: [facility] ORS;  Service: General;  Laterality: Right;   SIMPLE MASTECTOMY WITH AXILLARY SENTINEL NODE BIOPSY Left 12/23/2017    T2,N2 with 6/7 nodes positive. Whole breast radiation.  Surgeon: Earline Mayotte, MD;  Location: [facility] ORS;  Service: General;  Laterality: Left;   TONSILLECTOMY     Social History:  reports that she quit smoking about 5 years ago. Her smoking use included cigarettes. She started smoking about 35 years ago. She has a 30 pack-year smoking history. She has never used smokeless tobacco. She reports that she does not currently use alcohol. She reports that she does not use drugs.  Allergies  Allergen Reactions   Sulfa Antibiotics Diarrhea   Family History  Problem Relation Age of Onset   Stroke Mother    Thyroid disease Mother    Renal Disease Mother    Stroke Father    Heart attack Father    Sudden death Father 34       suicide   Anuerysm Brother    Breast cancer Neg Hx    Family history: Family history reviewed and not pertinent.  Prior to Admission medications   Medication Sig Start Date End Date Taking? Authorizing Provider  acetaminophen (TYLENOL) 500 MG tablet Take 1,000 mg by mouth every 6 (six) hours as needed for moderate pain or headache.     [provider]  ALPRAZolam Prudy Feeler) 0.5 MG tablet Take 1 tablet (0.5 mg total) by mouth 2 (two) times daily as needed for anxiety. 11/15/22   Jeralyn Ruths, MD  Amino Acids-Protein Hydrolys (PRO-STAT SUGAR FREE PO) Take 30 mLs by mouth 2 (two) times daily.    [provider]  amLODipine (NORVASC) 5 MG tablet Take 1 tablet (5 mg total) by mouth daily. Patient not taking: Reported on 11/15/2022 09/26/21   Corky Downs, MD  ascorbic acid (VITAMIN C) 500 MG tablet Take 500 mg by mouth daily.    [provider]  aspirin EC 81 MG tablet Take 81 mg by mouth at bedtime.     [provider]  calcium carbonate (OS-CAL - DOSED IN MG OF ELEMENTAL CALCIUM) 1250 (500 Ca) MG tablet Take 1 tablet by mouth daily with breakfast.    [provider]  Cholecalciferol (VITAMIN D3) 125 MCG (5000 UT) CAPS  Take 5,000 Units by mouth 2 (two) times a day.    [provider]  everolimus (AFINITOR) 5 MG tablet Take 1 tablet (5 mg total) by mouth daily. Patient not taking: Reported on 11/15/2022 10/17/22   Remi Haggard, RPH-CPP  exemestane (AROMASIN) 25 MG tablet Take 1 tablet (25 mg total) by mouth daily after breakfast. Patient not taking: Reported on 11/15/2022 09/18/22   Remi Haggard, RPH-CPP  hydrochlorothiazide (HYDRODIURIL) 12.5 MG tablet Take 12.5 mg by mouth daily. Patient not taking: Reported on 10/18/2022 09/12/22   [provider]  levothyroxine (SYNTHROID) 100 MCG tablet Take 1 tablet (100 mcg total) by mouth daily before breakfast. 08/01/22   Lurene Shadow, MD  linaclotide [facility]) 145 MCG CAPS capsule Take 1 capsule (145 mcg total) by mouth daily before breakfast. Patient not taking: Reported on 11/15/2022 10/08/22   Alinda Dooms, NP  loratadine (CLARITIN) 10 MG tablet Take 10 mg by mouth daily. Take to reduce risk of rash from everolimus.    [provider]  MAGNESIUM CITRATE PO Take by mouth.    [provider]  metoprolol tartrate (LOPRESSOR) 25 MG tablet Take 0.5 tablets (12.5 mg total) by mouth 2 (two) times daily. Patient not taking: Reported on 11/15/2022 08/01/22   Lurene Shadow, MD  Multiple Vitamin (MULTIVITAMIN WITH MINERALS) TABS tablet Take 1 tablet by mouth daily. 08/01/22   Lurene Shadow, MD  ondansetron (ZOFRAN) 8 MG tablet Take 1 tablet (8 mg total) by mouth every 8 (eight) hours as needed. 10/18/22   Jeralyn Ruths, MD  ondansetron (ZOFRAN) 8 MG tablet Take 1 tablet (8 mg total) by mouth every 8 (eight) hours as needed for nausea or vomiting. 10/18/22   Remi Haggard, RPH-CPP  pantoprazole (PROTONIX) 20 MG tablet TAKE 1 TABLET(20 MG) BY MOUTH DAILY 09/06/22   Jeralyn Ruths, MD  potassium chloride SA (KLOR-CON M) 20 MEQ tablet Take 1 tablet (20 mEq total) by mouth 2 (two) times daily. 10/23/22   Charlsie Quest, NP   Probiotic Product (ALIGN) 4 MG CAPS Take 1 capsule by mouth daily.    [provider]  prochlorperazine (COMPAZINE) 10 MG tablet Take 1 tablet (10 mg total) by mouth every 6 (six) hours as needed for nausea or vomiting. 10/18/22   Remi Haggard, RPH-CPP  rosuvastatin (CRESTOR) 5 MG tablet Take 1 tablet (5 mg total) by mouth daily. 08/22/21   Corky Downs, MD  Sennosides-Docusate Sodium (SENNA PLUS PO) Take 1 capsule by mouth as needed (constipation). Patient not taking: Reported on 11/15/2022    [provider]  traMADol (ULTRAM) 50 MG tablet Take 1 tablet (50 mg total) by mouth every 6 (six) hours as needed. 11/15/22   Jeralyn Ruths, MD  traZODone (DESYREL) 50 MG tablet Take 1 tablet (50 mg total) by mouth at bedtime. 08/01/22   Lurene Shadow, MD  traZODone (DESYREL) 50 MG tablet Take 1 tablet (50 mg total) by mouth at bedtime. 11/15/22   Jeralyn Ruths, MD   Physical Exam: Vitals:   11/26/22 1524 11/26/22 1530 11/26/22 1820 11/26/22 2000  BP:  (!) 140/75  128/66  Pulse:    79  Resp:  13  12  Temp:   98.8 F (37.1 C)   TempSrc:   Oral   SpO2: 95%   91%  Weight:      Height:       Constitutional: appears older than chronological age, frail, weak, acutely ill Eyes: PERRL, lids and conjunctivae normal ENMT: Mucous membranes are moist. Posterior pharynx clear of any exudate or lesions. Age-appropriate dentition. Hearing appropriate Neck: normal, supple, no masses, no thyromegaly Respiratory: clear to auscultation bilaterally, no wheezing, no crackles. Normal respiratory effort. No accessory muscle use.  Cardiovascular: Regular rate and rhythm, no murmurs / rubs / gallops. No extremity edema. 2+ pedal pulses. No carotid bruits.  Abdomen: no tenderness, no masses palpated, no hepatosplenomegaly. Bowel sounds positive.  Musculoskeletal: no clubbing / cyanosis. No joint deformity upper and lower extremities. Good ROM, no contractures, no atrophy. Normal muscle  tone.  Skin: no rashes, lesions, ulcers. No induration Neurologic: Sensation intact. Strength 5/5 in all 4.  Psychiatric: Normal judgment and insight. Alert and oriented x 3.  Depressed mood.  Flat affect  EKG: independently reviewed, showing sinus rhythm with rate of 90, QTc 430  Chest x-ray on Admission: I personally reviewed and I agree with radiologist reading as below.  CT CHEST WO CONTRAST  Result Date: 11/26/2022 CLINICAL DATA:  Tachycardia and palpitations. EXAM: CT CHEST WITHOUT CONTRAST TECHNIQUE: Multidetector CT imaging of the chest was performed  following the standard protocol without IV contrast. RADIATION DOSE REDUCTION: This exam was performed according to the departmental dose-optimization program which includes automated exposure control, adjustment of the mA and/or kV according to patient size and/or use of iterative reconstruction technique. COMPARISON:  CT chest abdomen and pelvis 11/21/2022. CT angiogram chest 07/24/2022. FINDINGS: Cardiovascular: No significant vascular findings. Normal heart size. No pericardial effusion. There are atherosclerotic calcifications of the aorta and coronary arteries. Mediastinum/Nodes: No enlarged mediastinal or axillary lymph nodes. Thyroid gland, trachea, and esophagus demonstrate no significant findings. Lungs/Pleura: Mild emphysematous changes are present. There are multifocal ground-glass and airspace opacities in the bilateral lower lobes. Right lower lobe bronchiectasis is again noted. Appearance is similar to most recent comparison. Postradiation changes noted in the anterior left upper lobe peripherally, unchanged. No pleural effusion or pneumothorax. There is a calcified granuloma in the right lower lobe. Upper Abdomen: No acute abnormality. Musculoskeletal: Left mastectomy changes are present. Bilateral scapular lesions are compatible with metastatic disease and appear grossly unchanged. No acute fractures are identified. IMPRESSION: 1.  Multifocal ground-glass and airspace opacities in the bilateral lower lobes, similar to most recent comparison. Findings are concerning for multifocal pneumonia. COVID pneumonia can have this appearance. 2. Right lower lobe bronchiectasis. 3. Stable postradiation changes in the left upper lobe. 4. Stable osseous metastatic disease. Aortic Atherosclerosis (ICD10-I70.0) and Emphysema (ICD10-J43.9). Electronically Signed   By: Darliss Cheney M.D.   On: 11/26/2022 21:57   US Venous Img Lower Bilateral  Result Date: 11/26/2022 CLINICAL DATA:  Shortness of breath. History of malignancy and pulmonary embolism. Evaluate for DVT. EXAM: BILATERAL LOWER EXTREMITY VENOUS DOPPLER ULTRASOUND TECHNIQUE: Gray-scale sonography with graded compression, as well as color Doppler and duplex ultrasound were performed to evaluate the lower extremity deep venous systems from the level of the common femoral vein and including the common femoral, femoral, profunda femoral, popliteal and calf veins including the posterior tibial, peroneal and gastrocnemius veins when visible. The superficial great saphenous vein was also interrogated. Spectral Doppler was utilized to evaluate flow at rest and with distal augmentation maneuvers in the common femoral, femoral and popliteal veins. COMPARISON:  Bilateral lower extremity venous Doppler ultrasound-12/01/2018 (negative). FINDINGS: RIGHT LOWER EXTREMITY Common Femoral Vein: No evidence of thrombus. Normal compressibility, respiratory phasicity and response to augmentation. Saphenofemoral Junction: No evidence of thrombus. Normal compressibility and flow on color Doppler imaging. Profunda Femoral Vein: No evidence of thrombus. Normal compressibility and flow on color Doppler imaging. Femoral Vein: No evidence of thrombus. Normal compressibility, respiratory phasicity and response to augmentation. Popliteal Vein: No evidence of thrombus. Normal compressibility, respiratory phasicity and response to  augmentation. Calf Veins: No evidence of thrombus. Normal compressibility and flow on color Doppler imaging. Superficial Great Saphenous Vein: No evidence of thrombus. Normal compressibility. Other Findings:  None. LEFT LOWER EXTREMITY Common Femoral Vein: No evidence of thrombus. Normal compressibility, respiratory phasicity and response to augmentation. Saphenofemoral Junction: No evidence of thrombus. Normal compressibility and flow on color Doppler imaging. Profunda Femoral Vein: No evidence of thrombus. Normal compressibility and flow on color Doppler imaging. Femoral Vein: No evidence of thrombus. Normal compressibility, respiratory phasicity and response to augmentation. Popliteal Vein: No evidence of thrombus. Normal compressibility, respiratory phasicity and response to augmentation. Calf Veins: No evidence of thrombus. Normal compressibility and flow on color Doppler imaging. Superficial Great Saphenous Vein: No evidence of thrombus. Normal compressibility. Other Findings:  None. IMPRESSION: No evidence of DVT within either lower extremity. Electronically Signed   By: Holland Commons.D.  On: 11/26/2022 17:16   DG Chest Portable 1 View  Result Date: 11/26/2022 CLINICAL DATA:  Shortness of breath. EXAM: PORTABLE CHEST 1 VIEW COMPARISON:  Chest x-ray 07/23/2022.  CT scan 11/21/2022. FINDINGS: Enlarged cardiopericardial silhouette. Tiny right effusion. No pneumothorax or edema. Chronic lung changes. Patchy consolidative like opacity along both lung bases, left-greater-than-right. Acute process is possible. Distribution is similar to the prior CT scan. IMPRESSION: Persistent lung base parenchymal opacity. Question right effusion. Recommend continued follow up to confirm clearance. Electronically Signed   By: Karen Kays M.D.   On: 11/26/2022 14:52    Labs on Admission: I have personally reviewed following labs  CBC: Recent Labs  Lab 11/26/22 1228  WBC 7.0  HGB 11.0*  HCT 35.0*  MCV 83.9  PLT  371   Basic Metabolic Panel: Recent Labs  Lab 11/26/22 1228 11/26/22 1600  NA 137  --   K 4.3  --   CL 98  --   CO2 29  --   GLUCOSE 127*  --   BUN 27*  --   CREATININE 2.76*  --   CALCIUM >15.0* 14.9*   GFR: Estimated Creatinine Clearance: 18.8 mL/min (A) (by C-G formula based on SCr of 2.76 mg/dL (H)).  Liver Function Tests: Recent Labs  Lab 11/26/22 1228  AST 32  ALT 19  ALKPHOS 108  BILITOT 0.9  PROT 6.9  ALBUMIN 3.6   Urine analysis:    Component Value Date/Time   COLORURINE YELLOW (A) 11/26/2022 1534   APPEARANCEUR HAZY (A) 11/26/2022 1534   LABSPEC 1.016 11/26/2022 1534   PHURINE 5.0 11/26/2022 1534   GLUCOSEU NEGATIVE 11/26/2022 1534   HGBUR NEGATIVE 11/26/2022 1534   BILIRUBINUR NEGATIVE 11/26/2022 1534   BILIRUBINUR 1+ 05/04/2021 1418   KETONESUR NEGATIVE 11/26/2022 1534   PROTEINUR NEGATIVE 11/26/2022 1534   UROBILINOGEN negative (A) 05/04/2021 1418   NITRITE NEGATIVE 11/26/2022 1534   LEUKOCYTESUR SMALL (A) 11/26/2022 1534   CRITICAL CARE Performed by: Dr. Sedalia Muta  Total critical care time: 35 minutes  Critical care time was exclusive of separately billable procedures and treating other patients.  Critical care was necessary to treat or prevent imminent or life-threatening deterioration.  Critical care was time spent personally by me on the following activities: development of treatment plan with patient and/or surrogate as well as nursing, discussions with consultants, evaluation of patient's response to treatment, examination of patient, obtaining history from patient or surrogate, ordering and performing treatments and interventions, ordering and review of laboratory studies, ordering and review of radiographic studies, pulse oximetry and re-evaluation of patient's condition.  This document was prepared using Dragon Voice Recognition software and may include unintentional dictation errors.  Dr. Sedalia Muta [facility]  If 7PM-7AM, please  contact overnight-coverage provider If 7AM-7PM, please contact day attending provider [email or web address]  11/26/2022, 10:49 PM

## 2022-11-26 NOTE — Assessment & Plan Note (Signed)
On CKD 3A at baseline Strict I's and O's Status post sodium chloride 500 mL bolus per EDP; ordered sodium chloride infusion at 125 mL/h, 1 day ordered Nephrology has been consulted Recheck BMP in the a.m.

## 2022-11-26 NOTE — Assessment & Plan Note (Addendum)
Status post sodium chloride 500 mL bolus per EDP Obtain ionized calcium and vitamin D level prior to medication administration, discussed with nursing Continue with sodium chloride infusion at 125 mL/h, 1 day ordered Calcitonin 4 units/kg dosing, twice daily, 2 doses ordered Zoledronic acid 4 mg IV infuse over 120 minutes, 1 dose ordered Check sCa2+ every 4 hours, 3 labs check ordered Nephrology has been consulted via secure chat and Epic order Admit to PCU, inpatient

## 2022-11-26 NOTE — Consult Note (Addendum)
PHARMACY - ANTICOAGULATION CONSULT NOTE  Pharmacy Consult for Heparin  Indication: pulmonary embolus (possible)  Allergies  Allergen Reactions   Sulfa Antibiotics Diarrhea    Patient Measurements: Height: 5\' 6"  (167.6 cm) Weight: 74.8 kg (165 lb) IBW/kg (Calculated) : 59.3 Heparin Dosing Weight: 74 kg   Vital Signs: Temp: 98.8 F (37.1 C) (11/11 1820) Temp Source: Oral (11/11 1820) BP: 140/75 (11/11 1530) Pulse Rate: 74 (11/11 1522)  Labs: Recent Labs    11/26/22 1228 11/26/22 1726  HGB 11.0*  --   HCT 35.0*  --   PLT 371  --   CREATININE 2.76*  --   TROPONINIHS 48* 44*    Estimated Creatinine Clearance: 18.8 mL/min (A) (by C-G formula based on SCr of 2.76 mg/dL (H)).   Medical History: Past Medical History:  Diagnosis Date   Anxiety    Breast cancer, left (HCC) 07/2017   Depression    Hypertension    Hypothyroidism    Personal history of chemotherapy 2019   LEFT mastectomy-chemo before   Personal history of radiation therapy 03/2018   LEFT mastectomy   Rapid heart rate    Thyroid disease     Medications:  (Not in a hospital admission)  Scheduled:   calcitonin  4 Units/kg Subcutaneous BID   heparin  5,000 Units Subcutaneous Q8H   Infusions:   sodium chloride 125 mL/hr at 11/26/22 1551   zoledronic acid (ZOMETA) IVPB 4 mg (11/26/22 1815)   PRN: acetaminophen **OR** acetaminophen, ondansetron **OR** ondansetron (ZOFRAN) IV, senna-docusate Anti-infectives (From admission, onward)    None       Assessment: 72 y.o. female who presents to the ER for evaluation of generalized malaise fatigue with hypoxia. Chest CT in progress. Negative for DVT. No DOAC PTA. D-dimer elevated. CBC stable, seems to be at baseline.   Goal of Therapy:  Heparin level 0.3-0.7 units/ml Monitor platelets by anticoagulation protocol: Yes   Plan:  Give 4400 units bolus x 1 Start heparin infusion at 1150 units/hr Check anti-Xa level in 8 hours and daily while on  heparin Continue to monitor H&H and platelets  Ronnald Ramp, PharmD, BCPS 11/26/2022,7:30 PM

## 2022-11-26 NOTE — ED Triage Notes (Signed)
Patient to ED via POV for hypotension. Pt states she checks her VS everyday and BP was in the 90's but her HR was high and oxygen was low. States some SOB. Has breast/bone cancer- no current tx. 86% on RA in triage- placed on 2L [place].

## 2022-11-26 NOTE — Hospital Course (Signed)
Ms. 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  Per EDP, patient was hypoxic to 72% on room air with no baseline O2 requirements  Vitals in the ED showed temperature of 97.9, respiration rate of 18, heart rate of 93, blood pressure 122/81, SpO2 of 92% on 2 L nasal cannula.  Serum sodium is 137, potassium 4.3, chloride 98, bicarb 29, BUN of 27, serum creatinine of 2.76, EGFR of 18, nonfasting blood glucose 127, WBC 7.0, hemoglobin 11, platelets of 371.  Serum calcium level was greater than 15.  Lactic acid was 1.9 and on repeat was 2.4.  ED treatment: Sodium chloride 500 mL bolus.

## 2022-11-26 NOTE — ED Notes (Signed)
MRI at bedside to transport Pt.

## 2022-11-26 NOTE — Progress Notes (Signed)
Pharmacy Antibiotic Note  Alicia [name] is a 72 y.o. female admitted on 11/26/2022 with sepsis. PMH significant for invasive carcinoma of the breast with bony metastasis s/p chemotherapy and left mastectomy, hypertension, depression, and anxiety. Patient is afebrile with no leukocytosis. Pharmacy has been consulted for vancomycin and cefepime dosing.  Plan: Start cefepime 2 g IV every 24 hours Give vancomycin load of 1500 mg IV x1 Will re-dose vancomycin per levels given AKI on admission (Scr 2.76, BL ~1.1-1.5) Monitor renal function, clinical status, culture data, and LOT  Height: 5\' 6"  (167.6 cm) Weight: 74.8 kg (165 lb) IBW/kg (Calculated) : 59.3  Temp (24hrs), Avg:98.3 F (36.8 C), Min:97.9 F (36.6 C), Max:98.8 F (37.1 C)  Recent Labs  Lab 11/26/22 1228 11/26/22 1506 11/26/22 1725 11/26/22 2020  WBC 7.0  --   --   --   CREATININE 2.76*  --   --   --   LATICACIDVEN 1.9 2.4* 1.7 2.4*    Estimated Creatinine Clearance: 18.8 mL/min (A) (by C-G formula based on SCr of 2.76 mg/dL (H)).    Allergies  Allergen Reactions   Sulfa Antibiotics Diarrhea   Antimicrobials this admission: vancomycin 11/11 >>  cefepime 11/11 >>   Dose adjustments this admission: N/A  Microbiology results: 11/11 BCx: pending  Thank you for involving pharmacy in this patient's care.   Rockwell Alexandria, PharmD Clinical Pharmacist 11/26/2022 9:58 PM

## 2022-11-26 NOTE — Assessment & Plan Note (Addendum)
Elevated lactic acid, new hypoxia requiring O2 supplementation, immunocompromise Given patient is immunocompromised in setting of palliative chemotherapy Will get blood cultures x 2, broad-spectrum antibiotics with cefepime and vancomycin Sodium chloride 1 L bolus ordered; change sodium chloride infusion from 125 mL to 150 mL/h, 1 day ordered Discussed with nursing via secure chat Continue admission to PCU, inpatient  Addendum: CT chest without contrast: Was read by radiology showing multifocal groundglass and airspace opacity in bilateral lower lobes, findings concerning for multifocal pneumonia.  COVID-19 can have this appearance.  COVID check via PCR ordered. Added azithromycin 500 mg IV, to complete 5-day course for atypical pneumonia

## 2022-11-26 NOTE — ED Notes (Signed)
Lab called to draw Blood cultures as Pt is a hard stick.

## 2022-11-26 NOTE — Assessment & Plan Note (Addendum)
Etiology work up in progress, suspect secondary to PE versus pneumonia Check procalcitonin, D-dimer and high sensitive troponin CTA PE was considered however given patient's acute kidney injury, contrast has been deferred on admission CT chest without contrast has been ordered by EDP and pending completion Admit to PCU, inpatient  Addendum: Procalcitonin is not elevated, at 0.23

## 2022-11-26 NOTE — Assessment & Plan Note (Signed)
Suspect secondary to demand ischemia in setting of acute hypoxic respiratory failure Low clinical suspicion for ACS as patient troponin was 48 and on repeat is 44 Treat hypoxia as above

## 2022-11-26 NOTE — ED Notes (Signed)
[place] IV placed RUA with blood draw by this nurse. While charting the IV, [place] came to bedside for study and when the [place] Tech moved the [place] machine, IV tubing was against the probe and dislodged the PIV. Dressing placed. Bleeding controlled.

## 2022-11-26 NOTE — Assessment & Plan Note (Signed)
Home antihypertensive medication not resumed on admission Daughter-in-law has not given patient antihypertensive medications today as she noted that patient's blood pressure was on the low normal side AM team to resume home antihypertensive medication when the benefits outweigh the risk Hydralazine 5 mg IV q6h as needed for SBP > 180, 4 days ordered

## 2022-11-26 NOTE — ED Notes (Signed)
R. Arm contrast infiltrated per pt.

## 2022-11-26 NOTE — Assessment & Plan Note (Signed)
Lorazepam 0.5 mg IV as needed for anxiety, can be given before MRI, 2 doses ordered

## 2022-11-26 NOTE — ED Provider Notes (Signed)
[facility] Provider Note    Event Date/Time   First MD Initiated Contact with Patient 11/26/22 1411     (approximate)   History   Hypotension   HPI  Alicia [name] is a 72 y.o. female who presents to the ER for evaluation of generalized malaise fatigue.  Provide was hypoxic to the 70s today also having tachycardia palpitations when she was up ambulating at home.  Does not wear home oxygen.  She is previously on hormonal and immunotherapy for breast cancer with mets.  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    Physical Exam   Triage Vital Signs: ED Triage Vitals  Encounter Vitals Group     BP 11/26/22 1225 122/81     Systolic BP Percentile --      Diastolic BP Percentile --      Pulse Rate 11/26/22 1225 93     Resp 11/26/22 1225 18     Temp 11/26/22 1225 97.9 F (36.6 C)     Temp Source 11/26/22 1225 Oral     SpO2 11/26/22 1225 92 %     Weight 11/26/22 1226 165 lb (74.8 kg)     Height 11/26/22 1226 5\' 6"  (1.676 m)     Head Circumference --      Peak Flow --      Pain Score 11/26/22 1226 0     Pain Loc --      Pain Education --      Exclude from Growth Chart --     Most recent vital signs: Vitals:   11/26/22 1225  BP: 122/81  Pulse: 93  Resp: 18  Temp: 97.9 F (36.6 C)  SpO2: 92%     Constitutional: Alert  Eyes: Conjunctivae are normal.  Head: Atraumatic. Nose: No congestion/rhinnorhea. Mouth/Throat: Mucous membranes are moist.   Neck: Painless ROM.  Cardiovascular:   Good peripheral circulation. Respiratory: Normal respiratory effort.  No retractions.  Gastrointestinal: Soft and nontender.  Musculoskeletal:  no deformity Neurologic:  MAE spontaneously. No gross focal neurologic deficits are appreciated.  Skin:  Skin is warm, dry and intact. No rash noted. Psychiatric: Mood and affect are normal. Speech and behavior are normal.    ED Results / Procedures / Treatments    Labs (all labs ordered are listed, but only abnormal results are displayed) Labs Reviewed  BASIC METABOLIC PANEL - Abnormal; Notable for the following components:      Result Value   Glucose, Bld 127 (*)    BUN 27 (*)    Creatinine, Ser 2.76 (*)    Calcium >15.0 (*)    GFR, Estimated 18 (*)    All other components within normal limits  CBC - Abnormal; Notable for the following components:   Hemoglobin 11.0 (*)    HCT 35.0 (*)    RDW 19.5 (*)    All other components within normal limits  LACTIC ACID, PLASMA  URINALYSIS, ROUTINE W REFLEX MICROSCOPIC  LACTIC ACID, PLASMA  D-DIMER, QUANTITATIVE  CBG MONITORING, ED     EKG  ED ECG REPORT I, Willy Eddy, the attending physician, personally viewed and interpreted this ECG.   Date: 11/26/2022  EKG Time: 12:37  Rate: 90  Rhythm: sinus  Axis: normal  Intervals: normal  ST&T Change: no stemi, no depressions    RADIOLOGY Please see ED Course for my review and interpretation.  I personally reviewed all radiographic images ordered to evaluate for the  above acute complaints and reviewed radiology reports and findings.  These findings were personally discussed with the patient.  Please see medical record for radiology report.    PROCEDURES:  Critical Care performed: Yes, see critical care procedure note(s)  .Critical Care  Performed by: Willy Eddy, MD Authorized by: Willy Eddy, MD   Critical care provider statement:    Critical care time (minutes):  34   Critical care was necessary to treat or prevent imminent or life-threatening deterioration of the following conditions:  Respiratory failure   Critical care was time spent personally by me on the following activities:  Ordering and performing treatments and interventions, ordering and review of laboratory studies, ordering and review of radiographic studies, pulse oximetry, re-evaluation of patient's condition, review of old charts, obtaining history  from patient or surrogate, examination of patient, evaluation of patient's response to treatment, discussions with primary provider, discussions with consultants and development of treatment plan with patient or surrogate    MEDICATIONS ORDERED IN ED: Medications  sodium chloride 0.9 % bolus 500 mL (500 mLs Intravenous Bolus 11/26/22 1444)     IMPRESSION / MDM / ASSESSMENT AND PLAN / ED COURSE  I reviewed the triage vital signs and the nursing notes.                              Differential diagnosis includes, but is not limited to, Asthma, copd, CHF, pna, ptx, malignancy, Pe, anemia  Patient presenting to the ER for evaluation of symptoms as described above.  Based on symptoms, risk factors and considered above differential, this presenting complaint could reflect a potentially life-threatening illness therefore the patient will be placed on continuous pulse oximetry and telemetry for monitoring.  Laboratory evaluation will be sent to evaluate for the above complaints.  Noted to be with AKI and significant hypercalcemia.  Will give IV fluids though also with new hypoxia.  Moderate suspicion for PE as she is with active malignancy and with previous history of PE.  Patient unable to receive CTA due to AKI.  Will order lower extremity duplex and D-dimer.  Will consult hospitalist for admission given her hypercalcemia and new O2 requirement.       FINAL CLINICAL IMPRESSION(S) / ED DIAGNOSES   Final diagnoses:  Acute respiratory failure with hypoxia (HCC)  Hypercalcemia     Rx / DC Orders   ED Discharge Orders     None        Note:  This document was prepared using Dragon voice recognition software and may include unintentional dictation errors.    Willy Eddy, MD 11/26/22 1515

## 2022-11-26 NOTE — Assessment & Plan Note (Signed)
Continue outpatient follow-up with medical oncology as appropriate 

## 2022-11-27 ENCOUNTER — Inpatient Hospital Stay: Payer: Medicare Other

## 2022-11-27 ENCOUNTER — Ambulatory Visit: Payer: No Typology Code available for payment source

## 2022-11-27 DIAGNOSIS — J9601 Acute respiratory failure with hypoxia: Principal | ICD-10-CM

## 2022-11-27 LAB — HEPARIN LEVEL (UNFRACTIONATED)
Heparin Unfractionated: 0.59 [IU]/mL (ref 0.30–0.70)
Heparin Unfractionated: 0.84 [IU]/mL — ABNORMAL HIGH (ref 0.30–0.70)

## 2022-11-27 LAB — BASIC METABOLIC PANEL
Anion gap: 8 (ref 5–15)
BUN: 27 mg/dL — ABNORMAL HIGH (ref 8–23)
CO2: 25 mmol/L (ref 22–32)
Calcium: 11.4 mg/dL — ABNORMAL HIGH (ref 8.9–10.3)
Chloride: 105 mmol/L (ref 98–111)
Creatinine, Ser: 2.44 mg/dL — ABNORMAL HIGH (ref 0.44–1.00)
GFR, Estimated: 20 mL/min — ABNORMAL LOW (ref >60)
Glucose, Bld: 112 mg/dL — ABNORMAL HIGH (ref 70–99)
Potassium: 3.5 mmol/L (ref 3.5–5.1)
Sodium: 138 mmol/L (ref 135–145)

## 2022-11-27 LAB — CBC
HCT: 28.7 % — ABNORMAL LOW (ref 36.0–46.0)
Hemoglobin: 9.1 g/dL — ABNORMAL LOW (ref 12.0–15.0)
MCH: 26 pg (ref 26.0–34.0)
MCHC: 31.7 g/dL (ref 30.0–36.0)
MCV: 82 fL (ref 80.0–100.0)
Platelets: 303 10*3/uL (ref 150–400)
RBC: 3.5 MIL/uL — ABNORMAL LOW (ref 3.87–5.11)
RDW: 19.5 % — ABNORMAL HIGH (ref 11.5–15.5)
WBC: 7.5 10*3/uL (ref 4.0–10.5)
nRBC: 0 % (ref 0.0–0.2)

## 2022-11-27 LAB — CALCIUM: Calcium: 11.7 mg/dL — ABNORMAL HIGH (ref 8.9–10.3)

## 2022-11-27 LAB — SARS CORONAVIRUS 2 BY RT PCR: SARS Coronavirus 2 by RT PCR: NEGATIVE

## 2022-11-27 LAB — VITAMIN D 25 HYDROXY (VIT D DEFICIENCY, FRACTURES): Vit D, 25-Hydroxy: 129.34 ng/mL — ABNORMAL HIGH (ref 30–100)

## 2022-11-27 LAB — LACTIC ACID, PLASMA: Lactic Acid, Venous: 0.8 mmol/L (ref 0.5–1.9)

## 2022-11-27 LAB — MRSA NEXT GEN BY PCR, NASAL: MRSA by PCR Next Gen: NOT DETECTED

## 2022-11-27 MED ORDER — POLYETHYLENE GLYCOL 3350 17 G PO PACK: 17.0000 g | PACK | Freq: Every day | ORAL | Status: DC | PRN

## 2022-11-27 MED ORDER — ENSURE ENLIVE PO LIQD
237.0000 mL | Freq: Two times a day (BID) | ORAL | Status: DC
  Administered 2022-11-27 – 2022-12-03 (×11): 237 mL via ORAL

## 2022-11-27 MED ORDER — TECHNETIUM TO 99M ALBUMIN AGGREGATED
4.0000 | Freq: Once | INTRAVENOUS | Status: AC | PRN
  Administered 2022-11-27: 4.18 via INTRAVENOUS

## 2022-11-27 NOTE — Consult Note (Signed)
PHARMACY - ANTICOAGULATION CONSULT NOTE  Pharmacy Consult for Heparin  Indication: pulmonary embolus (possible)  Allergies  Allergen Reactions   Sulfa Antibiotics Diarrhea    Patient Measurements: Height: 5\' 6"  (167.6 cm) Weight: 74.8 kg (165 lb) IBW/kg (Calculated) : 59.3 Heparin Dosing Weight: 74 kg   Vital Signs: Temp: 98.3 F (36.8 C) (11/12 1422) Temp Source: Oral (11/12 1300) BP: 140/64 (11/12 1422) Pulse Rate: 89 (11/12 1422)  Labs: Recent Labs    11/26/22 1228 11/26/22 1726 11/26/22 2020 11/27/22 0430 11/27/22 1308  HGB 11.0*  --   --  9.1*  --   HCT 35.0*  --   --  28.7*  --   PLT 371  --   --  303  --   APTT  --   --  26  --   --   LABPROT  --   --  14.5  --   --   INR  --   --  1.1  --   --   HEPARINUNFRC  --   --   --  0.59 0.84*  CREATININE 2.76*  --   --  2.44*  --   TROPONINIHS 48* 44*  --   --   --     Estimated Creatinine Clearance: 21.2 mL/min (A) (by C-G formula based on SCr of 2.44 mg/dL (H)).   Medical History: Past Medical History:  Diagnosis Date   Anxiety    Breast cancer, left (HCC) 07/2017   Depression    Hypertension    Hypothyroidism    Personal history of chemotherapy 2019   LEFT mastectomy-chemo before   Personal history of radiation therapy 03/2018   LEFT mastectomy   Rapid heart rate    Thyroid disease     Medications:  Medications Prior to Admission  Medication Sig Dispense Refill Last Dose   acetaminophen (TYLENOL) 500 MG tablet Take 1,000 mg by mouth every 6 (six) hours as needed for moderate pain or headache.    prn at unk   ALPRAZolam (XANAX) 0.5 MG tablet Take 1 tablet (0.5 mg total) by mouth 2 (two) times daily as needed for anxiety. 10 tablet 0 11/25/2022 at pm   amLODipine (NORVASC) 5 MG tablet Take 1 tablet (5 mg total) by mouth daily. 90 tablet 3 Past Week   ascorbic acid (VITAMIN C) 500 MG tablet Take 500 mg by mouth 2 (two) times daily.   11/25/2022 at pm   aspirin EC 81 MG tablet Take 81 mg by mouth at  bedtime.    11/25/2022 at pm   calcium carbonate (OS-CAL - DOSED IN MG OF ELEMENTAL CALCIUM) 1250 (500 Ca) MG tablet Take 1 tablet by mouth daily with breakfast.   11/25/2022 at am   Cholecalciferol (VITAMIN D3) 125 MCG (5000 UT) CAPS Take 5,000 Units by mouth 2 (two) times a day.   11/25/2022 at pm   levothyroxine (SYNTHROID) 100 MCG tablet Take 1 tablet (100 mcg total) by mouth daily before breakfast.   11/25/2022 at am   loratadine (CLARITIN) 10 MG tablet Take 10 mg by mouth daily. Take to reduce risk of rash from everolimus.   prn at unk   MAGNESIUM CITRATE PO Take by mouth.   11/25/2022 at am   Multiple Vitamin (MULTIVITAMIN WITH MINERALS) TABS tablet Take 1 tablet by mouth daily.   11/25/2022 at am   ondansetron (ZOFRAN) 8 MG tablet Take 1 tablet (8 mg total) by mouth every 8 (eight) hours as needed for nausea  or vomiting. 20 tablet 2 prn at unk   pantoprazole (PROTONIX) 20 MG tablet TAKE 1 TABLET(20 MG) BY MOUTH DAILY 90 tablet 0 11/25/2022 at am   potassium chloride SA (KLOR-CON M) 20 MEQ tablet Take 1 tablet (20 mEq total) by mouth 2 (two) times daily. 180 tablet 3 11/25/2022 at pm   Probiotic Product (ALIGN) 4 MG CAPS Take 1 capsule by mouth daily.   11/25/2022   prochlorperazine (COMPAZINE) 10 MG tablet Take 1 tablet (10 mg total) by mouth every 6 (six) hours as needed for nausea or vomiting. 30 tablet 2 prn at unk   rosuvastatin (CRESTOR) 5 MG tablet Take 1 tablet (5 mg total) by mouth daily. 90 tablet 3 11/25/2022 at am   Sennosides-Docusate Sodium (SENNA PLUS PO) Take 1 capsule by mouth as needed (constipation).   prn at unk   traMADol (ULTRAM) 50 MG tablet Take 1 tablet (50 mg total) by mouth every 6 (six) hours as needed. 60 tablet 0 Past Week   traZODone (DESYREL) 50 MG tablet Take 1 tablet (50 mg total) by mouth at bedtime. 30 tablet 2 11/25/2022 at pm   Amino Acids-Protein Hydrolys (PRO-STAT SUGAR FREE PO) Take 30 mLs by mouth 2 (two) times daily.      everolimus (AFINITOR) 5 MG  tablet Take 1 tablet (5 mg total) by mouth daily. (Patient not taking: Reported on 11/15/2022) 30 tablet 0 Not Taking   exemestane (AROMASIN) 25 MG tablet Take 1 tablet (25 mg total) by mouth daily after breakfast. (Patient not taking: Reported on 11/15/2022) 30 tablet 2 Not Taking   hydrochlorothiazide (HYDRODIURIL) 12.5 MG tablet Take 12.5 mg by mouth daily. (Patient not taking: Reported on 10/18/2022)   Not Taking   linaclotide (LINZESS) 145 MCG CAPS capsule Take 1 capsule (145 mcg total) by mouth daily before breakfast. (Patient not taking: Reported on 11/15/2022) 30 capsule 1 Not Taking   metoprolol tartrate (LOPRESSOR) 25 MG tablet Take 0.5 tablets (12.5 mg total) by mouth 2 (two) times daily. (Patient not taking: Reported on 11/15/2022)   Not Taking   ondansetron (ZOFRAN) 8 MG tablet Take 1 tablet (8 mg total) by mouth every 8 (eight) hours as needed. 30 tablet 1    traZODone (DESYREL) 50 MG tablet Take 1 tablet (50 mg total) by mouth at bedtime.      Scheduled:   ascorbic acid  500 mg Oral BID   [START ON 11/28/2022] calcium carbonate  1 tablet Oral Q breakfast   cholecalciferol  5,000 Units Oral QHS   levothyroxine  100 mcg Oral Q0600   multivitamin with minerals  1 tablet Oral Daily   rosuvastatin  5 mg Oral Daily   traZODone  50 mg Oral QHS   Infusions:   sodium chloride 75 mL/hr at 11/27/22 0831   azithromycin Stopped (11/27/22 0056)   ceFEPime (MAXIPIME) IV     heparin 1,150 Units/hr (11/26/22 2054)   PRN: acetaminophen **OR** acetaminophen, LORazepam, ondansetron **OR** ondansetron (ZOFRAN) IV, polyethylene glycol Anti-infectives (From admission, onward)    Start     Dose/Rate Route Frequency Ordered Stop   11/27/22 2200  ceFEPIme (MAXIPIME) 2 g in sodium chloride 0.9 % 100 mL IVPB        2 g 200 mL/hr over 30 Minutes Intravenous Every 24 hours 11/26/22 2206     11/26/22 2300  azithromycin (ZITHROMAX) 500 mg in dextrose 5 % 250 mL IVPB        500 mg 250 mL/hr over 60  Minutes Intravenous Every 24 hours 11/26/22 2248 12/01/22 2259   11/26/22 2205  vancomycin variable dose per unstable renal function (pharmacist dosing)  Status:  Discontinued         Does not apply See admin instructions 11/26/22 2205 11/27/22 1340   11/26/22 2145  vancomycin (VANCOREADY) IVPB 1500 mg/300 mL        1,500 mg 150 mL/hr over 120 Minutes Intravenous  Once 11/26/22 2130 11/27/22 0102   11/26/22 2130  ceFEPIme (MAXIPIME) 2 g in sodium chloride 0.9 % 100 mL IVPB        2 g 200 mL/hr over 30 Minutes Intravenous  Once 11/26/22 2130 11/27/22 0102       Assessment: 72 y.o. female who presents to the ER for evaluation of generalized malaise fatigue with hypoxia. Chest CT in progress. Negative for DVT. No DOAC PTA. D-dimer elevated. CBC stable, seems to be at baseline.   Goal of Therapy:  Heparin level 0.3-0.7 units/ml Monitor platelets by anticoagulation protocol: Yes  11/12 0430 HL 0.59, therapeutic x 1 11/12 1308 HL 0.84    Plan:  Heparin level is slightly supratherapeutic. Will decrease heparin infusion to 1000 units/hr. Recheck heparin level in 8 hours. CBC daily while on heparin.   Paschal Dopp, PharmD, BCPS 11/27/2022 2:36 PM

## 2022-11-27 NOTE — Plan of Care (Signed)

## 2022-11-27 NOTE — Progress Notes (Addendum)
PROGRESS NOTE  Alicia [name]    DOB: 1950-12-11, 72 y.o.  AOZ:[id number]    Code Status: Full Code   DOA: 11/26/2022   LOS: 1   Brief hospital course  Ms. 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  They presented to ED with acute hypoxic respiratory failure.   ED course: temp 97.9, respiration rate of 18, heart rate of 93, blood pressure 122/81, SpO2 of 92% on 2 L nasal cannula. (72% on room air with no baseline O2 requirements)   Serum sodium is 137, potassium 4.3, chloride 98, bicarb 29, BUN of 27, serum creatinine of 2.76, EGFR of 18, nonfasting blood glucose 127, WBC 7.0, hemoglobin 11, platelets of 371. Serum calcium level was greater than 15.  Lactic acid was 1.9 and on repeat was 2.4.   ED treatment: Sodium chloride 500 mL bolus.  Patient was admitted to medicine service for further workup and management of hypoxic respiratory failure, hypercalcemia as outlined in detail below.  11/27/22 -stable and improving  Assessment & Plan  Principal Problem:   Hypercalcemia Active Problems:   Elevated troponin   AKI (acute kidney injury) (HCC)   Acute hypoxic respiratory failure (HCC)   Severe sepsis (HCC)   Breast cancer, stage 2, left (HCC)   Malignant neoplasm metastatic to bone (HCC)   Electrolyte abnormality   Primary hypertension   Anxiety   Gastroesophageal reflux disease without esophagitis   Mobitz type 1 second degree AV block   Pure hypercholesterolemia  Severe sepsis (HCC) Acute hypoxic respiratory failure (HCC) multifocal pneumonia as seen on chest CT.  COVID-19 negative.  LA has normalized. Remains on 2L. Procalcitonin is not elevated, at 0.23 Pulmonary perfusion is pending to rule out PE.   - discontinue heparin gtt if scan is  negative for PE - continue IV Abx  - azithromycin, cefepime. Vanc was discontinued with negative MRSA nasal swab - wean oxygen as tolerated   AKI On CKD 3A at baseline. Cr 2.76>2.4. baseline around 1.2 Strict I's and O's, encourage PO fluid intake, avoid nephrotoxins Nephrology has been consulted Recheck BMP in the a.m.  Hypercalcemia- Status post sodium chloride 500 mL bolus followed by maintenance NS, Zoledronic acid 4 mg IV. Ca++ >15 and improved to 11.4 today - monitor   Elevated troponin Suspect secondary to demand ischemia in setting of acute hypoxic respiratory failure Low clinical suspicion for ACS as patient troponin was 48 and on repeat is 44 Treat hypoxia as above   Breast cancer, stage 2, left (HCC) Continue outpatient follow-up with medical oncology as appropriate - brain MRI on admission negative for acute intracranial abnormalities. Bone metastasis shown present and similar to prior imaging. Patient informed and she stated she was unaware of these lesions previously - inform oncology for follow up   Primary hypertension Home antihypertensive medication not resumed on admission Can be titrated back on as needed  Hypothyroidism - continue home levothyroxine - check TSH as could contribute to her overall fatigue if not well controlled   Pure hypercholesterolemia Rosuvastatin 5 mg daily resumed   Anxiety- continue home therapy  Body mass index is 26.63 kg/m.  VTE ppx: Place TED hose Start: 11/26/22 1535  Diet:     Diet   Diet Heart Room service appropriate? Yes; Fluid consistency: Thin   Consultants: Nephrology   Subjective 11/27/22  Pt reports feeling tired. Continues to have some dyspnea at rest.    Objective   Vitals:   11/27/22 0200 11/27/22 0449 11/27/22 0600 11/27/22 0603  BP: 128/71  138/69 138/69  Pulse: 74  87 83  Resp: 12  17 13   Temp:  98.2 F (36.8 C)    TempSrc:  Oral    SpO2: 97%  93% 100%  Weight:      Height:         Intake/Output Summary (Last 24 hours) at 11/27/2022 0757 Last data filed at 11/27/2022 0102 Gross per 24 hour  Intake 1752.02 ml  Output --  Net 1752.02 ml   Filed Weights   11/26/22 1226  Weight: 74.8 kg     Physical Exam:  General: awake, alert, NAD. Tired-appearing HEENT: atraumatic, clear conjunctiva, anicteric sclera, MMM, hearing grossly normal Respiratory: normal respiratory effort. Decreased lung sounds at bases Cardiovascular: quick capillary refill, normal S1/S2, RRR, no JVD, murmurs Gastrointestinal: soft, NT, ND Nervous: A&O x3. no gross focal neurologic deficits, normal speech Extremities: moves all equally, no edema, normal tone Skin: dry, intact, normal temperature, normal color. No rashes, lesions or ulcers on exposed skin Psychiatry: flat mood, congruent affect  Labs   I have personally reviewed the following labs and imaging studies CBC    Component Value Date/Time   WBC 7.5 11/27/2022 0430   RBC 3.50 (L) 11/27/2022 0430   HGB 9.1 (L) 11/27/2022 0430   HGB 10.5 (L) 10/08/2022 0902   HGB 14.2 02/11/2011 1349   HCT 28.7 (L) 11/27/2022 0430   HCT 40.9 02/11/2011 1349   PLT 303 11/27/2022 0430   PLT 249 10/08/2022 0902   PLT 151 02/11/2011 1349   MCV 82.0 11/27/2022 0430   MCV 89 02/11/2011 1349   MCH 26.0 11/27/2022 0430   MCHC 31.7 11/27/2022 0430   RDW 19.5 (H) 11/27/2022 0430   RDW 13.2 02/11/2011 1349   LYMPHSABS 0.2 (L) 11/15/2022 0919   MONOABS 0.5 11/15/2022 0919   EOSABS 0.1 11/15/2022 0919   BASOSABS 0.0 11/15/2022 0919      Latest Ref Rng & Units 11/27/2022    4:30 AM 11/26/2022   11:50 PM 11/26/2022    4:00 PM  BMP  Glucose 70 - 99 mg/dL 409     BUN 8 - 23 mg/dL 27     Creatinine 8.11 - 1.00 mg/dL 9.14     Sodium 782 - 956 mmol/L 138     Potassium 3.5 - 5.1 mmol/L 3.5     Chloride 98 - 111 mmol/L 105     CO2 22 - 32 mmol/L 25     Calcium 8.9 - 10.3 mg/dL 21.3  08.6  57.8     MR BRAIN WO CONTRAST  Result Date:  11/27/2022 CLINICAL DATA:  Initial evaluation for headache, neuro deficit. EXAM: MRI HEAD WITHOUT CONTRAST TECHNIQUE: Multiplanar, multiecho pulse sequences of the brain and surrounding structures were obtained without intravenous contrast. COMPARISON:  Prior study from 07/07/2019. FINDINGS: Brain: Mild age-related cerebral atrophy. Few scattered punctate foci of T2/FLAIR hyperintensity involving the supratentorial cerebral white matter, nonspecific, but most commonly related to chronic microvascular ischemic disease. Changes are extremely mild for age. No evidence for acute or subacute ischemia. No acute or chronic intracranial blood products. 8 mm well-circumscribed extra-axial lesion overlying the posterior left parietal convexity (series 9, image 34). Finding favored to reflect a small meningioma, and is similar as compared to prior brain MRI from 2021. No associated edema or mass  effect. No other mass lesion or midline shift. No hydrocephalus or extra-axial fluid collection. Pituitary gland within normal limits. Vascular: Major intracranial vascular flow voids are maintained. Skull and upper cervical spine: Craniocervical junction within normal limits. Heterogeneous mass measuring 5.2 x 2.2 cm at the posterior calvarium along the midline, consistent with an osseous metastasis (series 9, image 38). Additional 2.3 cm osseous metastasis at the left calvarium (series 9, image 34). Few additional scattered smaller lesions noted elsewhere about the calvarium. Sinuses/Orbits: Globes orbital soft tissues within normal limits. Paranasal sinuses are largely clear. No significant mastoid effusion. Other: None. IMPRESSION: 1. No acute intracranial abnormality. 2. Multiple osseous metastases involving the calvarium, the largest measuring up to 5.2 cm at the posterior calvarium along the midline. 3. 8 mm extra-axial lesion overlying the posterior left parietal convexity, relatively similar as compared to prior brain MRI  from 07/07/2019, and most likely reflecting a small meningioma. No associated edema or mass effect. 4. Underlying mild age-related cerebral atrophy with mild cerebral white matter disease, nonspecific, but most likely reflecting changes of chronic microvascular ischemic disease. Electronically Signed   By: Rise Mu M.D.   On: 11/27/2022 02:00   CT CHEST WO CONTRAST  Result Date: 11/26/2022 CLINICAL DATA:  Tachycardia and palpitations. EXAM: CT CHEST WITHOUT CONTRAST TECHNIQUE: Multidetector CT imaging of the chest was performed following the standard protocol without IV contrast. RADIATION DOSE REDUCTION: This exam was performed according to the departmental dose-optimization program which includes automated exposure control, adjustment of the mA and/or kV according to patient size and/or use of iterative reconstruction technique. COMPARISON:  CT chest abdomen and pelvis 11/21/2022. CT angiogram chest 07/24/2022. FINDINGS: Cardiovascular: No significant vascular findings. Normal heart size. No pericardial effusion. There are atherosclerotic calcifications of the aorta and coronary arteries. Mediastinum/Nodes: No enlarged mediastinal or axillary lymph nodes. Thyroid gland, trachea, and esophagus demonstrate no significant findings. Lungs/Pleura: Mild emphysematous changes are present. There are multifocal ground-glass and airspace opacities in the bilateral lower lobes. Right lower lobe bronchiectasis is again noted. Appearance is similar to most recent comparison. Postradiation changes noted in the anterior left upper lobe peripherally, unchanged. No pleural effusion or pneumothorax. There is a calcified granuloma in the right lower lobe. Upper Abdomen: No acute abnormality. Musculoskeletal: Left mastectomy changes are present. Bilateral scapular lesions are compatible with metastatic disease and appear grossly unchanged. No acute fractures are identified. IMPRESSION: 1. Multifocal ground-glass and  airspace opacities in the bilateral lower lobes, similar to most recent comparison. Findings are concerning for multifocal pneumonia. COVID pneumonia can have this appearance. 2. Right lower lobe bronchiectasis. 3. Stable postradiation changes in the left upper lobe. 4. Stable osseous metastatic disease. Aortic Atherosclerosis (ICD10-I70.0) and Emphysema (ICD10-J43.9). Electronically Signed   By: Darliss Cheney M.D.   On: 11/26/2022 21:57   US Venous Img Lower Bilateral  Result Date: 11/26/2022 CLINICAL DATA:  Shortness of breath. History of malignancy and pulmonary embolism. Evaluate for DVT. EXAM: BILATERAL LOWER EXTREMITY VENOUS DOPPLER ULTRASOUND TECHNIQUE: Gray-scale sonography with graded compression, as well as color Doppler and duplex ultrasound were performed to evaluate the lower extremity deep venous systems from the level of the common femoral vein and including the common femoral, femoral, profunda femoral, popliteal and calf veins including the posterior tibial, peroneal and gastrocnemius veins when visible. The superficial great saphenous vein was also interrogated. Spectral Doppler was utilized to evaluate flow at rest and with distal augmentation maneuvers in the common femoral, femoral and popliteal veins. COMPARISON:  Bilateral lower extremity  venous Doppler ultrasound-12/01/2018 (negative). FINDINGS: RIGHT LOWER EXTREMITY Common Femoral Vein: No evidence of thrombus. Normal compressibility, respiratory phasicity and response to augmentation. Saphenofemoral Junction: No evidence of thrombus. Normal compressibility and flow on color Doppler imaging. Profunda Femoral Vein: No evidence of thrombus. Normal compressibility and flow on color Doppler imaging. Femoral Vein: No evidence of thrombus. Normal compressibility, respiratory phasicity and response to augmentation. Popliteal Vein: No evidence of thrombus. Normal compressibility, respiratory phasicity and response to augmentation. Calf Veins:  No evidence of thrombus. Normal compressibility and flow on color Doppler imaging. Superficial Great Saphenous Vein: No evidence of thrombus. Normal compressibility. Other Findings:  None. LEFT LOWER EXTREMITY Common Femoral Vein: No evidence of thrombus. Normal compressibility, respiratory phasicity and response to augmentation. Saphenofemoral Junction: No evidence of thrombus. Normal compressibility and flow on color Doppler imaging. Profunda Femoral Vein: No evidence of thrombus. Normal compressibility and flow on color Doppler imaging. Femoral Vein: No evidence of thrombus. Normal compressibility, respiratory phasicity and response to augmentation. Popliteal Vein: No evidence of thrombus. Normal compressibility, respiratory phasicity and response to augmentation. Calf Veins: No evidence of thrombus. Normal compressibility and flow on color Doppler imaging. Superficial Great Saphenous Vein: No evidence of thrombus. Normal compressibility. Other Findings:  None. IMPRESSION: No evidence of DVT within either lower extremity. Electronically Signed   By: Simonne Come M.D.   On: 11/26/2022 17:16   DG Chest Portable 1 View  Result Date: 11/26/2022 CLINICAL DATA:  Shortness of breath. EXAM: PORTABLE CHEST 1 VIEW COMPARISON:  Chest x-ray 07/23/2022.  CT scan 11/21/2022. FINDINGS: Enlarged cardiopericardial silhouette. Tiny right effusion. No pneumothorax or edema. Chronic lung changes. Patchy consolidative like opacity along both lung bases, left-greater-than-right. Acute process is possible. Distribution is similar to the prior CT scan. IMPRESSION: Persistent lung base parenchymal opacity. Question right effusion. Recommend continued follow up to confirm clearance. Electronically Signed   By: Karen Kays M.D.   On: 11/26/2022 14:52    Disposition Plan & Communication  Patient status: Inpatient  Admitted From: Home Planned disposition location: Home Anticipated discharge date: 11/14 pending clinical  improvement  Family Communication: none at bedside    Author: Leeroy Bock, DO [facility] 11/27/2022, 7:57 AM   Available by Epic secure chat 7AM-7PM. If 7PM-7AM, please contact night-coverage.  [facility] contact information found on [email or web address].

## 2022-11-27 NOTE — ED Notes (Signed)
Pt stood to use bedside commode and return to bed with standby assist. Urine emptied, warm blanket provided. NAD, no other needs stated at this time.

## 2022-11-27 NOTE — ED Notes (Signed)
Report off to cathy rn

## 2022-11-27 NOTE — Consult Note (Signed)
PHARMACY - ANTICOAGULATION CONSULT NOTE  Pharmacy Consult for Heparin  Indication: pulmonary embolus (possible)  Allergies  Allergen Reactions   Sulfa Antibiotics Diarrhea    Patient Measurements: Height: 5\' 6"  (167.6 cm) Weight: 74.8 kg (165 lb) IBW/kg (Calculated) : 59.3 Heparin Dosing Weight: 74 kg   Vital Signs: Temp: 98.2 F (36.8 C) (11/12 0449) Temp Source: Oral (11/12 0449) BP: 128/71 (11/12 0200) Pulse Rate: 74 (11/12 0200)  Labs: Recent Labs    11/26/22 1228 11/26/22 1726 11/26/22 2020 11/27/22 0430  HGB 11.0*  --   --  9.1*  HCT 35.0*  --   --  28.7*  PLT 371  --   --  303  APTT  --   --  26  --   LABPROT  --   --  14.5  --   INR  --   --  1.1  --   HEPARINUNFRC  --   --   --  0.59  CREATININE 2.76*  --   --   --   TROPONINIHS 48* 44*  --   --     Estimated Creatinine Clearance: 18.8 mL/min (A) (by C-G formula based on SCr of 2.76 mg/dL (H)).   Medical History: Past Medical History:  Diagnosis Date   Anxiety    Breast cancer, left (HCC) 07/2017   Depression    Hypertension    Hypothyroidism    Personal history of chemotherapy 2019   LEFT mastectomy-chemo before   Personal history of radiation therapy 03/2018   LEFT mastectomy   Rapid heart rate    Thyroid disease     Medications:  (Not in a hospital admission)  Scheduled:   ascorbic acid  500 mg Oral BID   [START ON 11/28/2022] calcium carbonate  1 tablet Oral Q breakfast   cholecalciferol  5,000 Units Oral QHS   levothyroxine  100 mcg Oral Q0600   multivitamin with minerals  1 tablet Oral Daily   rosuvastatin  5 mg Oral Daily   traZODone  50 mg Oral QHS   vancomycin variable dose per unstable renal function (pharmacist dosing)   Does not apply See admin instructions   Infusions:   sodium chloride 150 mL/hr at 11/27/22 0118   azithromycin Stopped (11/27/22 0056)   ceFEPime (MAXIPIME) IV     heparin 1,150 Units/hr (11/26/22 2054)   PRN: acetaminophen **OR** acetaminophen,  hydrALAZINE, LORazepam, ondansetron **OR** ondansetron (ZOFRAN) IV, senna-docusate Anti-infectives (From admission, onward)    Start     Dose/Rate Route Frequency Ordered Stop   11/27/22 2200  ceFEPIme (MAXIPIME) 2 g in sodium chloride 0.9 % 100 mL IVPB        2 g 200 mL/hr over 30 Minutes Intravenous Every 24 hours 11/26/22 2206     11/26/22 2300  azithromycin (ZITHROMAX) 500 mg in dextrose 5 % 250 mL IVPB        500 mg 250 mL/hr over 60 Minutes Intravenous Every 24 hours 11/26/22 2248 12/01/22 2259   11/26/22 2205  vancomycin variable dose per unstable renal function (pharmacist dosing)         Does not apply See admin instructions 11/26/22 2205     11/26/22 2145  vancomycin (VANCOREADY) IVPB 1500 mg/300 mL        1,500 mg 150 mL/hr over 120 Minutes Intravenous  Once 11/26/22 2130 11/27/22 0102   11/26/22 2130  ceFEPIme (MAXIPIME) 2 g in sodium chloride 0.9 % 100 mL IVPB  2 g 200 mL/hr over 30 Minutes Intravenous  Once 11/26/22 2130 11/27/22 0102       Assessment: 72 y.o. female who presents to the ER for evaluation of generalized malaise fatigue with hypoxia. Chest CT in progress. Negative for DVT. No DOAC PTA. D-dimer elevated. CBC stable, seems to be at baseline.   Goal of Therapy:  Heparin level 0.3-0.7 units/ml Monitor platelets by anticoagulation protocol: Yes  11/12 0430 HL 0.59, therapeutic x 1   Plan:  Continue heparin infusion at 1150 units/hr Recheck HL in 8 hr to confirm CBC daily while on heparin  Otelia Sergeant, PharmD, [facility] 11/27/2022 5:17 AM

## 2022-11-28 ENCOUNTER — Other Ambulatory Visit: Payer: Self-pay

## 2022-11-28 LAB — CBC
HCT: 28.3 % — ABNORMAL LOW (ref 36.0–46.0)
Hemoglobin: 9.1 g/dL — ABNORMAL LOW (ref 12.0–15.0)
MCH: 26.4 pg (ref 26.0–34.0)
MCHC: 32.2 g/dL (ref 30.0–36.0)
MCV: 82 fL (ref 80.0–100.0)
Platelets: 288 10*3/uL (ref 150–400)
RBC: 3.45 MIL/uL — ABNORMAL LOW (ref 3.87–5.11)
RDW: 19.9 % — ABNORMAL HIGH (ref 11.5–15.5)
WBC: 7.8 10*3/uL (ref 4.0–10.5)
nRBC: 0 % (ref 0.0–0.2)

## 2022-11-28 LAB — BASIC METABOLIC PANEL
Anion gap: 14 (ref 5–15)
BUN: 24 mg/dL — ABNORMAL HIGH (ref 8–23)
CO2: 24 mmol/L (ref 22–32)
Calcium: 10.8 mg/dL — ABNORMAL HIGH (ref 8.9–10.3)
Chloride: 100 mmol/L (ref 98–111)
Creatinine, Ser: 2.12 mg/dL — ABNORMAL HIGH (ref 0.44–1.00)
GFR, Estimated: 24 mL/min — ABNORMAL LOW (ref >60)
Glucose, Bld: 96 mg/dL (ref 70–99)
Potassium: 2.5 mmol/L — CL (ref 3.5–5.1)
Sodium: 138 mmol/L (ref 135–145)

## 2022-11-28 LAB — MAGNESIUM: Magnesium: 1.6 mg/dL — ABNORMAL LOW (ref 1.7–2.4)

## 2022-11-28 LAB — CALCIUM, IONIZED: Calcium, Ionized, Serum: 7.8 mg/dL — ABNORMAL HIGH (ref 4.5–5.6)

## 2022-11-28 MED ORDER — MAGNESIUM SULFATE 2 GM/50ML IV SOLN
2.0000 g | Freq: Once | INTRAVENOUS | Status: AC
  Administered 2022-11-28: 2 g via INTRAVENOUS
  Filled 2022-11-28: qty 50

## 2022-11-28 MED ORDER — POTASSIUM CHLORIDE 10 MEQ/100ML IV SOLN
10.0000 meq | INTRAVENOUS | Status: DC
  Administered 2022-11-28: 10 meq via INTRAVENOUS
  Filled 2022-11-28 (×4): qty 100

## 2022-11-28 MED ORDER — POTASSIUM CHLORIDE CRYS ER 20 MEQ PO TBCR: 40.0000 meq | EXTENDED_RELEASE_TABLET | ORAL | Status: DC

## 2022-11-28 MED ORDER — POTASSIUM CHLORIDE 20 MEQ PO PACK
40.0000 meq | PACK | ORAL | Status: AC
  Administered 2022-11-28 (×2): 40 meq via ORAL
  Filled 2022-11-28 (×2): qty 2

## 2022-11-28 MED ORDER — POTASSIUM CHLORIDE 20 MEQ PO PACK
40.0000 meq | PACK | Freq: Once | ORAL | Status: AC
  Administered 2022-11-28: 40 meq via ORAL
  Filled 2022-11-28: qty 2

## 2022-11-28 NOTE — Plan of Care (Signed)

## 2022-11-28 NOTE — Progress Notes (Addendum)
PROGRESS NOTE  Alicia [name]    DOB: 1950-04-06, 72 y.o.  NGE:[id number]    Code Status: Full Code   DOA: 11/26/2022   LOS: 2   Brief hospital course  Ms. 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  They presented to ED with acute hypoxic respiratory failure.   ED course: temp 97.9, respiration rate of 18, heart rate of 93, blood pressure 122/81, SpO2 of 92% on 2 L nasal cannula. (72% on room air with no baseline O2 requirements)   Serum sodium is 137, potassium 4.3, chloride 98, bicarb 29, BUN of 27, serum creatinine of 2.76, EGFR of 18, nonfasting blood glucose 127, WBC 7.0, hemoglobin 11, platelets of 371. Serum calcium level was greater than 15.  Lactic acid was 1.9 and on repeat was 2.4.   ED treatment: Sodium chloride 500 mL bolus.  Patient was admitted to medicine service for further workup and management of hypoxic respiratory failure, hypercalcemia as outlined in detail below.  11/28/22 -stable and improving - weaned to 1-2 L/min O2.  Still feels overall poorly.  Assessment & Plan  Principal Problem:   Hypercalcemia Active Problems:   Elevated troponin   AKI (acute kidney injury) (HCC)   Acute hypoxic respiratory failure (HCC)   Severe sepsis (HCC)   Breast cancer, stage 2, left (HCC)   Malignant neoplasm metastatic to bone (HCC)   Electrolyte abnormality   Primary hypertension   Anxiety   Gastroesophageal reflux disease without esophagitis   Mobitz type 1 second degree AV block   Pure hypercholesterolemia   Acute respiratory failure with hypoxia (HCC)  Severe sepsis (HCC) Acute hypoxic respiratory failure (HCC) multifocal pneumonia as seen on chest CT.   COVID-19 negative.  LA normalized.  Remains on 1-2 L/min O2.  NM perfusion scan  negative for PE. BLE doppler U/S negative for DVT's bilaterally  - continue IV azithromycin, cefepime - vanc was discontinued with negative MRSA screen - wean oxygen as tolerated, target spO2>90%   AKI On CKD 3A at baseline. Cr 2.76>2.4. baseline around 1.2 Strict I's and O's Encourage PO fluids Avoid nephrotoxins & renally dose meds Nephrology following Daily BMP's  Hypercalcemia-  Treated with NS 500 mL bolus followed by maintenance NS, Zoledronic acid 4 mg IV. Ca++ >15 and improved to 10.8 today - nephrology following - daily BMPs  Hypokalemia - K 2.5 this AM, oral replacement ordered Monitor BMP's  Hypomagnesemia - Mg 1.6 this AM, 2 g IV replacement. Follow Mg levels   Elevated troponin Likely demand ischemia in setting of acute hypoxic respiratory failure Low clinical suspicion for ACS with flat & minimally elevated troponin   Breast cancer, stage 2, left (HCC) Continue outpatient follow-up with medical oncology as appropriate - brain MRI on admission negative for acute intracranial abnormalities. Bone metastasis shown present and similar to prior imaging. Patient informed and she stated she was unaware of these lesions previously - will notify [name] of admission   Primary hypertension Home meds held on admission to avoid hypotension, resume when appropriate. Monitor BP  Hypothyroidism - continue levothyroxine - check TSH with AM labs (last in July normal)    Pure hypercholesterolemia Continue home rosuvastatin    Anxiety- continue home meds  Body mass index is 26.63 kg/m.  VTE ppx: Place TED hose Start: 11/26/22 1535  Diet:  Diet   Diet Heart Room service appropriate? Yes; Fluid consistency: Thin   Consultants: Nephrology   Subjective 11/28/22    Pt reports feeling no better yet.  Feels extremely tired.  Having intermittently productive cough.  No fever/chills.     Objective   Vitals:   11/28/22 0536 11/28/22 0748 11/28/22 1153  11/28/22 1600  BP: 124/64 108/66 132/68 118/63  Pulse: 87 85 86 72  Resp: 20 18 20 16   Temp: 98.2 F (36.8 C) 98.7 F (37.1 C) 97.7 F (36.5 C) 98.5 F (36.9 C)  TempSrc:   Oral   SpO2: 93% 92% 91% 94%  Weight:      Height:        Intake/Output Summary (Last 24 hours) at 11/28/2022 1814 Last data filed at 11/28/2022 1540 Gross per 24 hour  Intake 410 ml  Output --  Net 410 ml   Filed Weights   11/26/22 1226  Weight: 74.8 kg     Physical Exam:  General exam: awake, alert, no acute distress, ill-appearing HEENT: atraumatic, clear conjunctiva, anicteric sclera, moist mucus membranes, hearing grossly normal  Respiratory system: CTAB with minimal intermittent rhonchi, no expiratory wheezes, normal respiratory effort at rest, on 1 L/min [place] O2. Cardiovascular system: normal S1/S2, RRR, no JVD, murmurs, rubs, gallops, no pedal edema.   Gastrointestinal system: soft, NT, ND, no HSM [name], +bowel sounds. Central nervous system: A&O x3. no gross focal neurologic deficits, normal speech Extremities: moves all , no edema, normal tone Skin: dry, intact, normal temperature Psychiatry: normal mood, congruent affect, judgement and insight appear normal   Labs   I have personally reviewed the following labs and imaging studies CBC    Component Value Date/Time   WBC 7.8 11/28/2022 0511   RBC 3.45 (L) 11/28/2022 0511   HGB 9.1 (L) 11/28/2022 0511   HGB 10.5 (L) 10/08/2022 0902   HGB 14.2 02/11/2011 1349   HCT 28.3 (L) 11/28/2022 0511   HCT 40.9 02/11/2011 1349   PLT 288 11/28/2022 0511   PLT 249 10/08/2022 0902   PLT 151 02/11/2011 1349   MCV 82.0 11/28/2022 0511   MCV 89 02/11/2011 1349   MCH 26.4 11/28/2022 0511   MCHC 32.2 11/28/2022 0511   RDW 19.9 (H) 11/28/2022 0511   RDW 13.2 02/11/2011 1349   LYMPHSABS 0.2 (L) 11/15/2022 0919   MONOABS 0.5 11/15/2022 0919   EOSABS 0.1 11/15/2022 0919   BASOSABS 0.0 11/15/2022 0919      Latest Ref Rng & Units 11/28/2022    5:11 AM  11/27/2022    4:30 AM 11/26/2022   11:50 PM  BMP  Glucose 70 - 99 mg/dL 96  696    BUN 8 - 23 mg/dL 24  27    Creatinine 2.95 - 1.00 mg/dL 2.84  1.32    Sodium 440 - 145 mmol/L 138  138    Potassium 3.5 - 5.1 mmol/L 2.5  3.5    Chloride 98 - 111 mmol/L 100  105    CO2 22 - 32 mmol/L 24  25    Calcium 8.9 - 10.3 mg/dL 10.2  72.5  36.6     NM Pulmonary Perfusion  Result Date: 11/27/2022 CLINICAL DATA:  Elevated D-dimer. Short of breath. Concern for pulmonary embolism. EXAM: NUCLEAR MEDICINE PERFUSION LUNG SCAN TECHNIQUE: Perfusion images were obtained in multiple projections after intravenous injection of radiopharmaceutical. RADIOPHARMACEUTICALS:  4.8 mCi Tc-54m MAA COMPARISON:  CT chest 11/26/2022. FINDINGS: No wedge-shaped peripheral perfusion defects to suggest acute  pulmonary embolism. Decreased regional perfusion to the lung bases in a non segmental pattern corresponds to airspace consolidation seen on comparison CT. IMPRESSION: 1. No evidence of acute pulmonary embolism. 2. Decreased perfusion lung bases corresponds to pneumonia seen on comparison CT Electronically Signed   By: Genevive Bi M.D.   On: 11/27/2022 14:58   MR BRAIN WO CONTRAST  Result Date: 11/27/2022 CLINICAL DATA:  Initial evaluation for headache, neuro deficit. EXAM: MRI HEAD WITHOUT CONTRAST TECHNIQUE: Multiplanar, multiecho pulse sequences of the brain and surrounding structures were obtained without intravenous contrast. COMPARISON:  Prior study from 07/07/2019. FINDINGS: Brain: Mild age-related cerebral atrophy. Few scattered punctate foci of T2/FLAIR hyperintensity involving the supratentorial cerebral white matter, nonspecific, but most commonly related to chronic microvascular ischemic disease. Changes are extremely mild for age. No evidence for acute or subacute ischemia. No acute or chronic intracranial blood products. 8 mm well-circumscribed extra-axial lesion overlying the posterior left parietal convexity  (series 9, image 34). Finding favored to reflect a small meningioma, and is similar as compared to prior brain MRI from 2021. No associated edema or mass effect. No other mass lesion or midline shift. No hydrocephalus or extra-axial fluid collection. Pituitary gland within normal limits. Vascular: Major intracranial vascular flow voids are maintained. Skull and upper cervical spine: Craniocervical junction within normal limits. Heterogeneous mass measuring 5.2 x 2.2 cm at the posterior calvarium along the midline, consistent with an osseous metastasis (series 9, image 38). Additional 2.3 cm osseous metastasis at the left calvarium (series 9, image 34). Few additional scattered smaller lesions noted elsewhere about the calvarium. Sinuses/Orbits: Globes orbital soft tissues within normal limits. Paranasal sinuses are largely clear. No significant mastoid effusion. Other: None. IMPRESSION: 1. No acute intracranial abnormality. 2. Multiple osseous metastases involving the calvarium, the largest measuring up to 5.2 cm at the posterior calvarium along the midline. 3. 8 mm extra-axial lesion overlying the posterior left parietal convexity, relatively similar as compared to prior brain MRI from 07/07/2019, and most likely reflecting a small meningioma. No associated edema or mass effect. 4. Underlying mild age-related cerebral atrophy with mild cerebral white matter disease, nonspecific, but most likely reflecting changes of chronic microvascular ischemic disease. Electronically Signed   By: Rise Mu M.D.   On: 11/27/2022 02:00   CT CHEST WO CONTRAST  Result Date: 11/26/2022 CLINICAL DATA:  Tachycardia and palpitations. EXAM: CT CHEST WITHOUT CONTRAST TECHNIQUE: Multidetector CT imaging of the chest was performed following the standard protocol without IV contrast. RADIATION DOSE REDUCTION: This exam was performed according to the departmental dose-optimization program which includes automated exposure  control, adjustment of the mA and/or kV according to patient size and/or use of iterative reconstruction technique. COMPARISON:  CT chest abdomen and pelvis 11/21/2022. CT angiogram chest 07/24/2022. FINDINGS: Cardiovascular: No significant vascular findings. Normal heart size. No pericardial effusion. There are atherosclerotic calcifications of the aorta and coronary arteries. Mediastinum/Nodes: No enlarged mediastinal or axillary lymph nodes. Thyroid gland, trachea, and esophagus demonstrate no significant findings. Lungs/Pleura: Mild emphysematous changes are present. There are multifocal ground-glass and airspace opacities in the bilateral lower lobes. Right lower lobe bronchiectasis is again noted. Appearance is similar to most recent comparison. Postradiation changes noted in the anterior left upper lobe peripherally, unchanged. No pleural effusion or pneumothorax. There is a calcified granuloma in the right lower lobe. Upper Abdomen: No acute abnormality. Musculoskeletal: Left mastectomy changes are present. Bilateral scapular lesions are compatible with metastatic disease and appear grossly unchanged. No acute fractures are identified. IMPRESSION:  1. Multifocal ground-glass and airspace opacities in the bilateral lower lobes, similar to most recent comparison. Findings are concerning for multifocal pneumonia. COVID pneumonia can have this appearance. 2. Right lower lobe bronchiectasis. 3. Stable postradiation changes in the left upper lobe. 4. Stable osseous metastatic disease. Aortic Atherosclerosis (ICD10-I70.0) and Emphysema (ICD10-J43.9). Electronically Signed   By: Darliss Cheney M.D.   On: 11/26/2022 21:57    Disposition Plan & Communication  Patient status: Inpatient  Admitted From: Home Planned disposition location: Home Anticipated discharge date: 11/14 pending clinical improvement  Family Communication: daughter at bedside    Author: Pennie Banter, DO [facility] 11/28/2022,  6:14 PM   Available by Epic secure chat 7AM-7PM. If 7PM-7AM, please contact night-coverage.  [facility] contact information found on [email or web address].

## 2022-11-28 NOTE — TOC Initial Note (Signed)
Transition of Care [facility]) - Initial/Assessment Note    Patient Details  Name: Alicia [name] MRN: [id number] Date of Birth: 04/23/1950  [facility]) CM/SW Contact:    Darolyn Rua, LCSW Phone Number: 11/28/2022, 11:22 AM  Clinical Narrative:                  Csw met with patient at bedside, along with [facility] daughter in law. Patient reports living with her and son, confirms PCP is Dr. Juel Burrow and pharmacy is [facility] on [address].   She reports hx of HH services through [facility] just ended in October for PT and RN, reports having a walker, cane, rollator and 3in1 at home.   Currently on O2 in hospital, does not wear O2 at baseline.   TOC will continue to follow for discharge planning needs.   Expected Discharge Plan: Home w Home Health Services Barriers to Discharge: Continued Medical Work up   Patient Goals and CMS Choice Patient states their goals for this hospitalization and ongoing recovery are:: to go home CMS Medicare.gov Compare Post Acute Care list provided to:: Patient Choice offered to / list presented to : Patient      Expected Discharge Plan and Services       Living arrangements for the past 2 months: Single Family Home                                      Prior Living Arrangements/Services Living arrangements for the past 2 months: Single Family Home Lives with:: Adult Children                   Activities of Daily Living   ADL Screening (condition at time of admission) Independently performs ADLs?: Yes (appropriate for developmental age) Does the patient have a NEW difficulty with bathing/dressing/toileting/self-feeding that is expected to last >3 days?: No Does the patient have a NEW difficulty with getting in/out of bed, walking, or climbing stairs that is expected to last >3 days?: No Does the patient have a NEW difficulty with communication that is expected to last >3 days?: No Is the patient deaf or have  difficulty hearing?: No Does the patient have difficulty seeing, even when wearing glasses/contacts?: No Does the patient have difficulty concentrating, remembering, or making decisions?: No  Permission Sought/Granted                  Emotional Assessment       Orientation: : Oriented to Self, Oriented to Place, Oriented to  Time, Oriented to Situation Alcohol / Substance Use: Not Applicable Psych Involvement: No (comment)  Admission diagnosis:  Hypercalcemia [E83.52] Acute respiratory failure with hypoxia (HCC) [J96.01] Patient Active Problem List   Diagnosis Date Noted   Acute respiratory failure with hypoxia (HCC) 11/27/2022   Hypercalcemia 11/26/2022   Acute hypoxic respiratory failure (HCC) 11/26/2022   Severe sepsis (HCC) 11/26/2022   Pneumonia 07/24/2022   Palliative care encounter 07/24/2022   Malnutrition of moderate degree 07/20/2022   Acute UTI 07/20/2022   Pressure injury of skin 07/20/2022   Hypokalemia 07/19/2022   Hypophosphatemia 07/19/2022   Acute metabolic encephalopathy 07/19/2022   Generalized weakness 07/18/2022   AKI (acute kidney injury) (HCC) 07/18/2022   Electrolyte abnormality 07/18/2022   QT prolongation 07/18/2022   Diarrhea 07/18/2022   Altered mental status 07/18/2022   Contact dermatitis 05/10/2021   Hematuria 03/31/2021  Urinary tract infection without hematuria 03/01/2021   PSVT (paroxysmal supraventricular tachycardia) (HCC) 02/20/2020   Mobitz type 1 second degree AV block 02/20/2020   Pure hypercholesterolemia 02/20/2020   Daytime somnolence 02/18/2020   Chest pain 11/13/2019   Palpitations 11/13/2019   Obesity (BMI 35.0-39.9 without comorbidity) 11/08/2019   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) 09/27/2019   Anxiety 08/17/2019   Primary hypertension 08/17/2019   Gastroesophageal reflux disease without esophagitis 08/17/2019   Malignant neoplasm metastatic to bone (HCC) 08/06/2019   Tachycardia 06/18/2019    Pulmonary embolism (HCC) 01/11/2018   Sepsis (HCC) 10/05/2017   Breast cancer, stage 2, left (HCC) 07/24/2017   Elevated troponin 02/17/2015   PCP:  Corky Downs, MD Pharmacy:   [id number] [place], [place] - [address] AT [facility] OF [address] [place] [address] Phone: [phone] Fax: [phone]  [place] [facility] [address] [place] [address] Phone: [phone] Fax: [phone]     Social Determinants of Health (SDOH) Social History: SDOH Screenings   Food Insecurity: No Food Insecurity (07/18/2022)  Housing: Patient Declined (07/18/2022)  Transportation Needs: No Transportation Needs (07/18/2022)  Utilities: Not At Risk (07/18/2022)  Alcohol Screen: Low Risk  (03/09/2022)  Depression (PHQ2-9): Low Risk  (03/09/2022)  Financial Resource Strain: High Risk (03/09/2022)  Physical Activity: Insufficiently Active (03/09/2022)  Social Connections: Socially Isolated (03/09/2022)  Stress: No Stress Concern Present (03/09/2022)  Tobacco Use: Medium Risk (11/26/2022)   SDOH Interventions:     Readmission Risk Interventions     No data to display

## 2022-11-28 NOTE — Progress Notes (Signed)
Pharmacy Antibiotic Note  Alicia [name] is a 72 y.o. female admitted on 11/26/2022 with sepsis. PMH significant for invasive carcinoma of the breast with bony metastasis s/p chemotherapy and left mastectomy, hypertension, depression, and anxiety. Patient is afebrile with no leukocytosis. Pharmacy has been consulted for cefepime dosing.  Plan: Continue cefepime 2 g IV every 24 hours. Monitor renal function, clinical status, culture data, and LOT  Height: 5\' 6"  (167.6 cm) Weight: 74.8 kg (165 lb) IBW/kg (Calculated) : 59.3  Temp (24hrs), Avg:98.6 F (37 C), Min:98.2 F (36.8 C), Max:98.8 F (37.1 C)  Recent Labs  Lab 11/26/22 1228 11/26/22 1506 11/26/22 1725 11/26/22 2020 11/27/22 0430 11/28/22 0511  WBC 7.0  --   --   --  7.5 7.8  CREATININE 2.76*  --   --   --  2.44* 2.12*  LATICACIDVEN 1.9 2.4* 1.7 2.4* 0.8  --     Estimated Creatinine Clearance: 24.4 mL/min (A) (by C-G formula based on SCr of 2.12 mg/dL (H)).    Allergies  Allergen Reactions   Sulfa Antibiotics Diarrhea   Antimicrobials this admission: vancomycin 11/11 >> 11/12 cefepime 11/11 >>  Azithromycin 11/11>>  Dose adjustments this admission: N/A  Microbiology results: 11/11 BCx: pending  Thank you for involving pharmacy in this patient's care.   Sosha Shepherd Rodriguez-Guzman PharmD, BCPS 11/28/2022 11:00 AM

## 2022-11-29 ENCOUNTER — Inpatient Hospital Stay: Payer: Medicare Other | Admitting: Oncology

## 2022-11-29 ENCOUNTER — Ambulatory Visit: Payer: No Typology Code available for payment source

## 2022-11-29 ENCOUNTER — Inpatient Hospital Stay: Payer: Medicare Other | Admitting: Pharmacist

## 2022-11-29 ENCOUNTER — Inpatient Hospital Stay: Payer: Medicare Other | Attending: Oncology

## 2022-11-29 ENCOUNTER — Inpatient Hospital Stay: Payer: Medicare Other

## 2022-11-29 LAB — RENAL FUNCTION PANEL
Albumin: 3.2 g/dL — ABNORMAL LOW (ref 3.5–5.0)
Anion gap: 11 (ref 5–15)
BUN: 22 mg/dL (ref 8–23)
CO2: 24 mmol/L (ref 22–32)
Calcium: 11.6 mg/dL — ABNORMAL HIGH (ref 8.9–10.3)
Chloride: 102 mmol/L (ref 98–111)
Creatinine, Ser: 2.15 mg/dL — ABNORMAL HIGH (ref 0.44–1.00)
GFR, Estimated: 24 mL/min — ABNORMAL LOW (ref >60)
Glucose, Bld: 104 mg/dL — ABNORMAL HIGH (ref 70–99)
Phosphorus: 2.5 mg/dL (ref 2.5–4.6)
Potassium: 3.4 mmol/L — ABNORMAL LOW (ref 3.5–5.1)
Sodium: 137 mmol/L (ref 135–145)

## 2022-11-29 LAB — CBC
HCT: 29.7 % — ABNORMAL LOW (ref 36.0–46.0)
Hemoglobin: 9.7 g/dL — ABNORMAL LOW (ref 12.0–15.0)
MCH: 26.4 pg (ref 26.0–34.0)
MCHC: 32.7 g/dL (ref 30.0–36.0)
MCV: 80.7 fL (ref 80.0–100.0)
Platelets: 282 10*3/uL (ref 150–400)
RBC: 3.68 MIL/uL — ABNORMAL LOW (ref 3.87–5.11)
RDW: 19.8 % — ABNORMAL HIGH (ref 11.5–15.5)
WBC: 6.4 10*3/uL (ref 4.0–10.5)
nRBC: 0 % (ref 0.0–0.2)

## 2022-11-29 LAB — MAGNESIUM: Magnesium: 2 mg/dL (ref 1.7–2.4)

## 2022-11-29 LAB — EXPECTORATED SPUTUM ASSESSMENT W GRAM STAIN, RFLX TO RESP C

## 2022-11-29 MED ORDER — ORAL CARE MOUTH RINSE: 15.0000 mL | OROMUCOSAL | Status: DC | PRN

## 2022-11-29 MED ORDER — POTASSIUM CHLORIDE 20 MEQ PO PACK
40.0000 meq | PACK | Freq: Once | ORAL | Status: AC
  Administered 2022-11-29: 40 meq via ORAL
  Filled 2022-11-29: qty 2

## 2022-11-29 MED ORDER — SODIUM CHLORIDE 0.9 % IV SOLN: INTRAVENOUS | Status: DC

## 2022-11-29 NOTE — Plan of Care (Signed)
  Problem: Education: Goal: Knowledge of General Education information will improve Description: Including pain rating scale, medication(s)/side effects and non-pharmacologic comfort measures 11/29/2022 0552 by Peggye Ley, Karren Burly, RN Outcome: Progressing 11/29/2022 0552 by Trude Mcburney, RN Outcome: Progressing   Problem: Health Behavior/Discharge Planning: Goal: Ability to manage health-related needs will improve 11/29/2022 0552 by Peggye Ley, Karren Burly, RN Outcome: Progressing 11/29/2022 0552 by Trude Mcburney, RN Outcome: Progressing   Problem: Clinical Measurements: Goal: Ability to maintain clinical measurements within normal limits will improve Outcome: Progressing Goal: Will remain free from infection Outcome: Progressing Goal: Diagnostic test results will improve Outcome: Progressing Goal: Respiratory complications will improve Outcome: Progressing Goal: Cardiovascular complication will be avoided Outcome: Progressing   Problem: Activity: Goal: Risk for activity intolerance will decrease Outcome: Progressing   Problem: Nutrition: Goal: Adequate nutrition will be maintained Outcome: Progressing   Problem: Coping: Goal: Level of anxiety will decrease Outcome: Progressing   Problem: Elimination: Goal: Will not experience complications related to bowel motility Outcome: Progressing Goal: Will not experience complications related to urinary retention Outcome: Progressing   Problem: Pain Management: Goal: General experience of comfort will improve Outcome: Progressing   Problem: Safety: Goal: Ability to remain free from injury will improve Outcome: Progressing   Problem: Skin Integrity: Goal: Risk for impaired skin integrity will decrease Outcome: Progressing

## 2022-11-29 NOTE — Care Management Important Message (Signed)
Important Message  Patient Details  Name: Alicia [name] MRN: [id number] Date of Birth: Mar 14, 1950   Important Message Given:  N/A - LOS <3 / Initial given by admissions     Olegario Messier A Darriona Dehaas 11/29/2022, 12:34 PM

## 2022-11-29 NOTE — Evaluation (Signed)
Occupational Therapy Evaluation Patient Details Name: Alicia [name] MRN: [id number] DOB: Apr 26, 1950 Today's Date: 11/29/2022   History of Present Illness 72 year old female with PMHx of left breast carcinoma with bony metastasis, s/p chemotherapy and L mastectomy prior radiation therapy, hypertension, depression, anxiety, hypothyroid, who presents to the ED for chief concerns of acute hypoxic respiratory failure. No PE.   Clinical Impression   Pt was seen for OT evaluation this date. Prior to hospital admission, pt lives with her son and DIL. Reports using RW or SPC for mobility and having to go up/down 16 steps to 2nd level where her bed and bathroom are located. IND/MOD I with ADLs and assists with some simple IADLs.    Pt presents to acute OT demonstrating impaired ADL performance and functional mobility 2/2 weakness and limited activity tolerance. No mention of pain during session. Pt currently requires MOD I for bed mobility. SUP for STS and CGA/SBA for SPT from bed to recliner. LB dressing seated EOB with SUP to don/doff socks. Orthostatic vitals taken:  124/61 supine 121/65 seated 80/53 stand at 0 min 121/60-return to seated  Pt endorses only minimal dizziness in standing with BP drop. Pt would benefit from skilled OT services to address noted impairments and functional limitations (see below for any additional details) in order to maximize safety and independence while minimizing falls risk and caregiver burden. Do anticipate the need for follow up OT services upon acute hospital DC.        If plan is discharge home, recommend the following: A little help with walking and/or transfers;A little help with bathing/dressing/bathroom;Assistance with cooking/housework;Help with stairs or ramp for entrance    Functional Status Assessment  Patient has had a recent decline in their functional status and demonstrates the ability to make significant improvements in function in a  reasonable and predictable amount of time.  Equipment Recommendations  None recommended by OT    Recommendations for Other Services       Precautions / Restrictions Precautions Precautions: Fall Restrictions Weight Bearing Restrictions: No      Mobility Bed Mobility Overal bed mobility: Modified Independent                  Transfers Overall transfer level: Needs assistance Equipment used: 2 person hand held assist, Rolling walker (2 wheels) Transfers: Sit to/from Stand Sit to Stand: Supervision           General transfer comment: attempted STS x2 with RW and HHA requring SUP for both and SBA/CGA for transfer from bed to recliner with HHA x2      Balance Overall balance assessment: Mild deficits observed, not formally tested                                         ADL either performed or assessed with clinical judgement   ADL Overall ADL's : Needs assistance/impaired Eating/Feeding: Set up Eating/Feeding Details (indicate cue type and reason): seated in recliner after transfer from bed                 Lower Body Dressing: Supervision/safety;Sitting/lateral leans Lower Body Dressing Details (indicate cue type and reason): to doff/don socks seated at EOB                     Vision         Perception  Praxis         Pertinent Vitals/Pain Pain Assessment Pain Assessment: No/denies pain     Extremity/Trunk Assessment Upper Extremity Assessment Upper Extremity Assessment: Overall WFL for tasks assessed   Lower Extremity Assessment Lower Extremity Assessment: Generalized weakness       Communication Communication Communication: No apparent difficulties   Cognition Arousal: Alert Behavior During Therapy: WFL for tasks assessed/performed Overall Cognitive Status: Within Functional Limits for tasks assessed                                       General Comments  seated balance good and  steady; standing balance pt steady, but endorses minimal dizziness during standing trials; BP checked with noted drop from sit to stand and no drop from supine to sit; no drop once in standing for 3 minutes    Exercises Other Exercises Other Exercises: Edu on role of OT in acute setting and importance of therapy to maximize strength and endurance to promote ability to return home safely.   Shoulder Instructions      Home Living Family/patient expects to be discharged to:: Private residence Living Arrangements: Children (son and DIL) Available Help at Discharge: Family;Available PRN/intermittently Type of Home: House Home Access: Stairs to enter [employer] of Steps: 5; flight of steps -16 steps to get to second level where bedroom is Entrance Stairs-Rails: Right Home Layout: Two level Alternate Level Stairs-Number of Steps: flight-16 steps to 2nd level where bedroom is   Bathroom Shower/Tub: Chief Strategy Officer: Standard Bathroom Accessibility: Yes How Accessible: Accessible via walker Home Equipment: Rolling Walker (2 wheels);Cane - single point;Rollator (4 wheels);Shower seat;BSC/3in1   Additional Comments: sleeps and bathes upstairs, could rearrange to live downstairs if needed      Prior Functioning/Environment Prior Level of Function : Independent/Modified Independent             Mobility Comments: uses RW or SPC for mobility ADLs Comments: Reports IND with ADLs and assist from son and DIL for IADLs, pt able to do some of her laundry        OT Problem List: Decreased strength;Decreased activity tolerance      OT Treatment/Interventions: Self-care/ADL training;Therapeutic exercise;Therapeutic activities;Energy conservation;DME and/or AE instruction;Patient/family education;Balance training    OT Goals(Current goals can be found in the care plan section) Acute Rehab OT Goals Patient Stated Goal: return home OT Goal Formulation: With  patient Time For Goal Achievement: 12/13/22 Potential to Achieve Goals: Good ADL Goals Pt Will Perform Lower Body Bathing: with modified independence;sitting/lateral leans;sit to/from stand Pt Will Perform Lower Body Dressing: with modified independence;sitting/lateral leans;sit to/from stand Pt Will Transfer to Toilet: with modified independence;regular height toilet;ambulating;grab bars Pt Will Perform Toileting - Clothing Manipulation and hygiene: with modified independence;sit to/from stand  OT Frequency: Min 1X/week    Co-evaluation              AM-PAC OT "6 Clicks" Daily Activity     Outcome Measure Help from another person eating meals?: None Help from another person taking care of personal grooming?: None Help from another person toileting, which includes using toliet, bedpan, or urinal?: A Little Help from another person bathing (including washing, rinsing, drying)?: A Little Help from another person to put on and taking off regular upper body clothing?: None Help from another person to put on and taking off regular lower body clothing?: A Little 6  Click Score: 21   End of Session Equipment Utilized During Treatment: Rolling walker (2 wheels) Nurse Communication: Mobility status  Activity Tolerance: Patient tolerated treatment well Patient left: in chair;with call bell/phone within reach  OT Visit Diagnosis: Other abnormalities of gait and mobility (R26.89)                Time: 4259-5638 OT Time Calculation (min): 38 min Charges:  OT General Charges $OT Visit: 1 Visit OT Evaluation $OT Eval Low Complexity: 1 Low OT Treatments $Therapeutic Activity: 23-37 mins Darroll Bredeson, OTR/L 11/29/22, 2:08 PM Zahir Eisenhour E Orville Widmann 11/29/2022, 2:05 PM

## 2022-11-29 NOTE — Evaluation (Signed)
Physical Therapy Evaluation Patient Details Name: Alicia [name] MRN: [id number] DOB: [birth year] Today's Date: 11/29/2022  History of Present Illness  72 year old female with PMHx of left breast carcinoma with bony metastasis, s/p chemotherapy and L mastectomy prior radiation therapy, hypertension, depression, anxiety, hypothyroid, who presents to the ED for chief concerns of acute hypoxic respiratory failure, hypercalcemia. No PE.  Clinical Impression  Patient seated in recliner upon arrival to room; alert and oriented, follows commands and agreeable to participation with session as tolerated.  Denies pain; does endorse generalized fatigue due to acute illness.  Noted with positive orthostatics during OT evaluation; per patient, symptomatic with assessment. Bilat UE/LE strength and ROM Grossly symmetrical and WFL; no focal weakness appreciated.  Able to complete bed mobility with mod indep; sit/stand, standing balance and bed/chair without assist device, cga/min assist.   Demonstrates short, shuffling steps; notably fatigued with minimal activity.  Per OT evaluation, positive orthostatics (symptomatic) with transition to upright; additional gait deferred as result. Of note, patient 95-96% on 1L upon arrival to room; weaned to RA during session, maintaining sats >95% at rest and with exertion on RA.  Left on RA end of session; RN informed/aware. Would benefit from skilled PT to address above deficits and promote optimal return to PLOF.; recommend post-acute PT follow up as indicated by interdisciplinary care team.        If plan is discharge home, recommend the following: A little help with walking and/or transfers;A little help with bathing/dressing/bathroom   Can travel by private vehicle        Equipment Recommendations    Recommendations for Other Services       Functional Status Assessment Patient has had a recent decline in their functional status and demonstrates the ability to  make significant improvements in function in a reasonable and predictable amount of time.     Precautions / Restrictions Precautions Precautions: Fall Restrictions Weight Bearing Restrictions: No      Mobility  Bed Mobility Overal bed mobility: Modified Independent                  Transfers Overall transfer level: Needs assistance Equipment used: None Transfers: Sit to/from Stand, Bed to chair/wheelchair/BSC Sit to Stand: Contact guard assist, Min assist Stand pivot transfers: Min assist, Contact guard assist         General transfer comment: short, shuffling steps; notably fatigued with minimal activity.  Per OT evaluation, positive orthostatics (symptomatic) with transition to upright    Ambulation/Gait               General Gait Details: deferred due to generalized fatigue, symptomatic orthostasis  Stairs            Wheelchair Mobility     Tilt Bed    Modified Rankin (Stroke Patients Only)       Balance Overall balance assessment: Needs assistance Sitting-balance support: No upper extremity supported, Feet supported Sitting balance-Leahy Scale: Good     Standing balance support: No upper extremity supported Standing balance-Leahy Scale: Fair                               Pertinent Vitals/Pain Pain Assessment Pain Assessment: No/denies pain    Home Living Family/patient expects to be discharged to:: Private residence Living Arrangements: Children Available Help at Discharge: Family;Available PRN/intermittently Type of Home: House Home Access: Stairs to enter Entrance Stairs-Rails: Right Entrance Stairs-Number of Steps: 5; flight of steps -  16 steps to get to second level where bedroom is Alternate Level Stairs-Number of Steps: flight-16 steps to 2nd level where bedroom is Home Layout: Two level Home Equipment: Agricultural consultant (2 wheels);Cane - single point;Rollator (4 wheels);Shower seat;BSC/3in1 Additional Comments:  sleeps and bathes upstairs, could rearrange to live downstairs if needed    Prior Function Prior Level of Function : Independent/Modified Independent             Mobility Comments: uses RW or SPC for mobility ADLs Comments: Reports IND with ADLs and assist from son and DIL for IADLs, pt able to do some of her laundry     Extremity/Trunk Assessment   Upper Extremity Assessment Upper Extremity Assessment: Generalized weakness (grossly 4/5 throughout; no focal weakness appreciated)    Lower Extremity Assessment Lower Extremity Assessment: Generalized weakness (grossly 4/5 throughout; no focal weakness appreciated)       Communication   Communication Communication: No apparent difficulties  Cognition Arousal: Alert Behavior During Therapy: WFL for tasks assessed/performed Overall Cognitive Status: Within Functional Limits for tasks assessed                                          General Comments General comments (skin integrity, edema, etc.): seated balance good and steady; standing balance pt steady, but endorses minimal dizziness during standing trials; BP checked with noted drop from sit to stand and no drop from supine to sit; no drop once in standing for 3 minutes    Exercises     Assessment/Plan    PT Assessment Patient needs continued PT services  PT Problem List Decreased activity tolerance;Decreased balance;Decreased mobility;Decreased safety awareness;Decreased knowledge of precautions;Cardiopulmonary status limiting activity       PT Treatment Interventions DME instruction;Gait training;Stair training;Functional mobility training;Therapeutic activities;Therapeutic exercise;Balance training;Patient/family education    PT Goals (Current goals can be found in the Care Plan section)  Acute Rehab PT Goals Patient Stated Goal: to get stronger and feel better PT Goal Formulation: With patient Time For Goal Achievement: 12/13/22 Potential to  Achieve Goals: Good    Frequency Min 1X/week     Co-evaluation               AM-PAC PT "6 Clicks" Mobility  Outcome Measure Help needed turning from your back to your side while in a flat bed without using bedrails?: None Help needed moving from lying on your back to sitting on the side of a flat bed without using bedrails?: None Help needed moving to and from a bed to a chair (including a wheelchair)?: A Little Help needed standing up from a chair using your arms (e.g., wheelchair or bedside chair)?: A Little Help needed to walk in hospital room?: A Little Help needed climbing 3-5 steps with a railing? : A Lot 6 Click Score: 19    End of Session   Activity Tolerance: Patient limited by fatigue;Treatment limited secondary to medical complications (Comment) (symptomatic orthostasis) Patient left: in bed;with bed alarm set;with call bell/phone within reach Nurse Communication: Mobility status PT Visit Diagnosis: Difficulty in walking, not elsewhere classified (R26.2);Muscle weakness (generalized) (M62.81)    Time: 9629-5284 PT Time Calculation (min) (ACUTE ONLY): 19 min   Charges:   PT Evaluation $PT Eval Moderate Complexity: 1 Mod   PT General Charges $$ ACUTE PT VISIT: 1 Visit         Judithe Keetch H. Manson Passey, PT, DPT,  NCS 11/29/22, 3:12 PM [phone]

## 2022-11-29 NOTE — Progress Notes (Addendum)
PROGRESS NOTE  QUINEISHA PHENICIE    DOB: 1950/10/14, 72 y.o.  MVH:[id number]    Code Status: Full Code   DOA: 11/26/2022   LOS: 3   Brief hospital course  Ms. 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  They presented to ED with acute hypoxic respiratory failure.   ED course: temp 97.9, respiration rate of 18, heart rate of 93, blood pressure 122/81, SpO2 of 92% on 2 L nasal cannula. (72% on room air with no baseline O2 requirements)   Serum sodium is 137, potassium 4.3, chloride 98, bicarb 29, BUN of 27, serum creatinine of 2.76, EGFR of 18, nonfasting blood glucose 127, WBC 7.0, hemoglobin 11, platelets of 371. Serum calcium level was greater than 15.  Lactic acid was 1.9 and on repeat was 2.4.   ED treatment: Sodium chloride 500 mL bolus.  Patient was admitted to medicine service for further workup and management of hypoxic respiratory failure, hypercalcemia as outlined in detail below.  11/29/22 -stable and improving - weaned to 1-2 L/min O2.  Still feels overall poorly.  Assessment & Plan  Principal Problem:   Hypercalcemia Active Problems:   Elevated troponin   AKI (acute kidney injury) (HCC)   Acute hypoxic respiratory failure (HCC)   Severe sepsis (HCC)   Breast cancer, stage 2, left (HCC)   Malignant neoplasm metastatic to bone (HCC)   Electrolyte abnormality   Primary hypertension   Anxiety   Gastroesophageal reflux disease without esophagitis   Mobitz type 1 second degree AV block   Pure hypercholesterolemia   Acute respiratory failure with hypoxia (HCC)  Acute hypoxic respiratory failure (HCC) Multifocal pneumonia as seen on chest CT.   COVID-19 negative.  Remains on 1-2 L/min O2.  NM perfusion scan negative for PE. BLE doppler U/S  negative for DVT's bilaterally  - continue IV azithromycin, cefepime - vanc was discontinued with negative MRSA screen - wean oxygen as tolerated, target spO2>90%   Severe sepsis -- ruled out.  Pt met only 1 SIRS criteria (HR>90)  Lactic acidosis - resolved  AKI superimopsed on CKD stage 3A at baseline.  Cr 2.76>>2.4>>2.12>>2.15. baseline around 1.2 Strict I's and O's Will run gentle IV fluids x 1 day Encourage PO hydration Avoid nephrotoxins & renally dose meds Nephrology following Daily BMP's  Hypercalcemia-  Treated with NS 500 mL bolus followed by maintenance NS, Zoledronic acid 4 mg IV. Ca++ >15 and improved to 10.8 today - nephrology following - daily BMPs  Hypokalemia - K 2.5 this AM, oral replacement ordered Monitor BMP's  Hypomagnesemia - Mg 1.6 this AM, 2 g IV replacement. Follow Mg levels   Elevated troponin Likely demand ischemia in setting of acute hypoxic respiratory failure Low clinical suspicion for ACS with flat & minimally elevated troponin   Breast cancer, stage 2, left (HCC) Continue outpatient follow-up with medical oncology as appropriate - brain MRI on admission negative for acute intracranial abnormalities. Bone metastasis shown present and similar to prior imaging. Patient informed and she stated she was unaware of these lesions previously - will notify [name] of admission   Primary hypertension Home meds held on admission to avoid hypotension, resume when appropriate. Monitor BP  Hypothyroidism - continue levothyroxine - check TSH with AM labs (last in July normal)    Pure hypercholesterolemia Continue home rosuvastatin  Anxiety- continue home meds  Body mass index is 26.63 kg/m.  VTE ppx: Place TED hose Start: 11/26/22 1535  Diet:     Diet   Diet regular Room service appropriate? Yes; Fluid consistency: Thin   Consultants: Nephrology   Subjective 11/29/22    Pt reports feeling slightly better today.  Still quite  fatigued. Intermittent cough.  No SOB at rest.  Still on 1 L O2 this AM.     Objective   Vitals:   11/29/22 0827 11/29/22 1229 11/29/22 1359 11/29/22 1505  BP: (!) 143/75 (!) 106/55 124/61   Pulse: 86 81    Resp:      Temp:  97.7 F (36.5 C)    TempSrc:      SpO2:  93%  95%  Weight:      Height:        Intake/Output Summary (Last 24 hours) at 11/29/2022 1605 Last data filed at 11/29/2022 1300 Gross per 24 hour  Intake 496.5 ml  Output --  Net 496.5 ml   Filed Weights   11/26/22 1226  Weight: 74.8 kg     Physical Exam:  General exam: awake, alert, no acute distress, mildly ill-appearing HEENT: moist mucus membranes, hearing grossly normal  Respiratory system: lungs clear, no expiratory wheezes or rhonchi heard, normal respiratory effort at rest, on 1 L/min [place] O2. Cardiovascular system: normal S1/S2, RRR, no pedal edema.   Gastrointestinal system: soft, NT, ND Central nervous system: A&O x3. no gross focal neurologic deficits, normal speech Extremities: moves all , no edema, normal tone Skin: dry, intact, normal temperature Psychiatry: normal mood, congruent affect, judgement and insight appear normal   Labs   I have personally reviewed the following labs and imaging studies CBC    Component Value Date/Time   WBC 6.4 11/29/2022 0557   RBC 3.68 (L) 11/29/2022 0557   HGB 9.7 (L) 11/29/2022 0557   HGB 10.5 (L) 10/08/2022 0902   HGB 14.2 02/11/2011 1349   HCT 29.7 (L) 11/29/2022 0557   HCT 40.9 02/11/2011 1349   PLT 282 11/29/2022 0557   PLT 249 10/08/2022 0902   PLT 151 02/11/2011 1349   MCV 80.7 11/29/2022 0557   MCV 89 02/11/2011 1349   MCH 26.4 11/29/2022 0557   MCHC 32.7 11/29/2022 0557   RDW 19.8 (H) 11/29/2022 0557   RDW 13.2 02/11/2011 1349   LYMPHSABS 0.2 (L) 11/15/2022 0919   MONOABS 0.5 11/15/2022 0919   EOSABS 0.1 11/15/2022 0919   BASOSABS 0.0 11/15/2022 0919      Latest Ref Rng & Units 11/29/2022    5:57 AM 11/28/2022    5:11 AM 11/27/2022     4:30 AM  BMP  Glucose 70 - 99 mg/dL 010  96  272   BUN 8 - 23 mg/dL 22  24  27    Creatinine 0.44 - 1.00 mg/dL 5.36  6.44  0.34   Sodium 135 - 145 mmol/L 137  138  138   Potassium 3.5 - 5.1 mmol/L 3.4  2.5  3.5   Chloride 98 - 111 mmol/L 102  100  105   CO2 22 - 32 mmol/L 24  24  25    Calcium 8.9 - 10.3 mg/dL 74.2  59.5  63.8     No results found.  Disposition Plan & Communication  Patient status: Inpatient  Admitted From: Home Planned disposition location: Home Anticipated discharge date: 11/15-16 pending clinical improvement  Family Communication: son at bedside    Author: Tresa Endo  Gean Birchwood, DO [facility] 11/29/2022, 4:05 PM   Available by Epic secure chat 7AM-7PM. If 7PM-7AM, please contact night-coverage.  [facility] contact information found on [email or web address].

## 2022-11-29 NOTE — Progress Notes (Signed)
Pharmacy Antibiotic Note  Alicia [name] is a 72 y.o. female admitted on 11/26/2022 with sepsis. PMH significant for invasive carcinoma of the breast with bony metastasis s/p chemotherapy and left mastectomy, hypertension, depression, and anxiety. Patient is afebrile with no leukocytosis. Pharmacy has been consulted for cefepime dosing.  Plan: Day 3 antibiotics  Continue cefepime 2 g IV every 24 hours Follow up with planned duration Also on zithromax 500 mg daily --> end date 11/16  Scr 2.15 (elevated from base line ~1.2)  Monitor renal function, clinical status, culture data, and LOT  Height: 5\' 6"  (167.6 cm) Weight: 74.8 kg (165 lb) IBW/kg (Calculated) : 59.3  Temp (24hrs), Avg:98.1 F (36.7 C), Min:97.7 F (36.5 C), Max:98.6 F (37 C)  Recent Labs  Lab 11/26/22 1228 11/26/22 1506 11/26/22 1725 11/26/22 2020 11/27/22 0430 11/28/22 0511 11/29/22 0557  WBC 7.0  --   --   --  7.5 7.8 6.4  CREATININE 2.76*  --   --   --  2.44* 2.12* 2.15*  LATICACIDVEN 1.9 2.4* 1.7 2.4* 0.8  --   --     Estimated Creatinine Clearance: 24.1 mL/min (A) (by C-G formula based on SCr of 2.15 mg/dL (H)).    Allergies  Allergen Reactions   Sulfa Antibiotics Diarrhea   Antimicrobials this admission: vancomycin 11/11 >> 11/12 cefepime 11/11 >>  Azithromycin 11/11>>  Dose adjustments this admission: N/A  Microbiology results: 11/11 BCx: NGTD  11/12 MRSA nares: negative  Thank you for involving pharmacy in this patient's care.   Effie Shy, PharmD Pharmacy Resident  11/29/2022 1:19 PM

## 2022-11-30 DIAGNOSIS — I951 Orthostatic hypotension: Secondary | ICD-10-CM | POA: Clinically undetermined

## 2022-11-30 LAB — RENAL FUNCTION PANEL
Albumin: 3 g/dL — ABNORMAL LOW (ref 3.5–5.0)
Anion gap: 12 (ref 5–15)
BUN: 25 mg/dL — ABNORMAL HIGH (ref 8–23)
CO2: 22 mmol/L (ref 22–32)
Calcium: 10.9 mg/dL — ABNORMAL HIGH (ref 8.9–10.3)
Chloride: 105 mmol/L (ref 98–111)
Creatinine, Ser: 1.96 mg/dL — ABNORMAL HIGH (ref 0.44–1.00)
GFR, Estimated: 27 mL/min — ABNORMAL LOW (ref >60)
Glucose, Bld: 96 mg/dL (ref 70–99)
Phosphorus: 2.1 mg/dL — ABNORMAL LOW (ref 2.5–4.6)
Potassium: 3.5 mmol/L (ref 3.5–5.1)
Sodium: 139 mmol/L (ref 135–145)

## 2022-11-30 NOTE — Progress Notes (Signed)
Physical Therapy Treatment Patient Details Name: Alicia [name] MRN: [id number] DOB: [birth year] Today's Date: 11/30/2022   History of Present Illness 72 year old female with PMHx of left breast carcinoma with bony metastasis, s/p chemotherapy and L mastectomy prior radiation therapy, hypertension, depression, anxiety, hypothyroid, who presents to the ED for chief concerns of acute hypoxic respiratory failure, hypercalcemia. No PE.    PT Comments  Pt seen for PT tx with pt agreeable. Pt is able to complete bed mobility with mod I, STS with CGA, & ambulate around bed without AD with CGA<>min assist. Pt c/o dizziness with transitional movements, even endorsing some hazy/foggy feeling in standing, clearly anxious during standing activity. PT encouraged pt to sit in recliner for all meals & increase OOB time. Will continue to follow pt acutely to progress gait as tolerated.  BP checked in RUE  BP HR  Supine  123/64 mmHg MAP 82 86 bpm  Sitting 115/71 mmHg MAP 81 97 bpm  Standing at 0 106/62 mmHg MAP 74 106 bpm  Unable to tolerate standing 3 minutes, BP checked after returning to sitting 131/67 mmHg MAP 85 101 bpm  Standing after ambulating around bed to recliner 100/63 mmHg MAP 74 HR 116 bpm      If plan is discharge home, recommend the following: A little help with walking and/or transfers;A little help with bathing/dressing/bathroom   Can travel by private vehicle        Equipment Recommendations  Rolling walker (2 wheels)    Recommendations for Other Services       Precautions / Restrictions Precautions Precautions: Fall Restrictions Weight Bearing Restrictions: No     Mobility  Bed Mobility Overal bed mobility: Modified Independent             General bed mobility comments: supine>sit to exit R side of bed    Transfers Overall transfer level: Needs assistance Equipment used: None Transfers: Sit to/from Stand Sit to Stand: Contact guard assist            General transfer comment: STS from EOB    Ambulation/Gait Ambulation/Gait assistance: Contact guard assist, Min assist Gait Distance (Feet): 7 Feet Assistive device: None Gait Pattern/deviations: Decreased step length - right, Decreased stride length, Decreased step length - left Gait velocity: decreased     General Gait Details: Pt ambulates around bed to recliner without AD with CGA<>min assist.   Stairs             Wheelchair Mobility     Tilt Bed    Modified Rankin (Stroke Patients Only)       Balance Overall balance assessment: Needs assistance Sitting-balance support: No upper extremity supported, Feet supported Sitting balance-Leahy Scale: Good     Standing balance support: No upper extremity supported Standing balance-Leahy Scale: Fair                              Cognition Arousal: Alert Behavior During Therapy: Anxious, WFL for tasks assessed/performed Overall Cognitive Status: Within Functional Limits for tasks assessed                                          Exercises      General Comments        Pertinent Vitals/Pain Pain Assessment Pain Assessment: No/denies pain    Home Living  Prior Function            PT Goals (current goals can now be found in the care plan section) Acute Rehab PT Goals Patient Stated Goal: to get stronger and feel better PT Goal Formulation: With patient Time For Goal Achievement: 12/13/22 Potential to Achieve Goals: Good Progress towards PT goals: Progressing toward goals    Frequency    Min 1X/week      PT Plan      Co-evaluation              AM-PAC PT "6 Clicks" Mobility   Outcome Measure  Help needed turning from your back to your side while in a flat bed without using bedrails?: None Help needed moving from lying on your back to sitting on the side of a flat bed without using bedrails?: None Help needed moving to  and from a bed to a chair (including a wheelchair)?: A Little Help needed standing up from a chair using your arms (e.g., wheelchair or bedside chair)?: A Little Help needed to walk in hospital room?: A Little Help needed climbing 3-5 steps with a railing? : A Little 6 Click Score: 20    End of Session   Activity Tolerance: Patient limited by fatigue;Treatment limited secondary to medical complications (Comment) (low BP) Patient left: in chair;with call bell/phone within reach Nurse Communication: Mobility status PT Visit Diagnosis: Muscle weakness (generalized) (M62.81);Unsteadiness on feet (R26.81);Difficulty in walking, not elsewhere classified (R26.2)     Time: 5284-1324 PT Time Calculation (min) (ACUTE ONLY): 13 min  Charges:    $Therapeutic Activity: 8-22 mins PT General Charges $$ ACUTE PT VISIT: 1 Visit                     Aleda Grana, PT, DPT 11/30/22, 4:25 PM    Sandi Mariscal 11/30/2022, 4:23 PM

## 2022-11-30 NOTE — Care Management Important Message (Signed)
Important Message  Patient Details  Name: Alicia [name] MRN: [id number] Date of Birth: September 26, 1950   Important Message Given:  N/A - LOS <3 / Initial given by admissions     Olegario Messier A Chia Rock 11/30/2022, 12:35 PM

## 2022-11-30 NOTE — Progress Notes (Signed)
PROGRESS NOTE  Alicia [name]    DOB: 07-01-1950, 72 y.o.  NUU:[id number]    Code Status: Full Code   DOA: 11/26/2022   LOS: 4   Brief hospital course  Ms. 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  They presented to ED with acute hypoxic respiratory failure.   ED course: temp 97.9, respiration rate of 18, heart rate of 93, blood pressure 122/81, SpO2 of 92% on 2 L nasal cannula. (72% on room air with no baseline O2 requirements)   Serum sodium is 137, potassium 4.3, chloride 98, bicarb 29, BUN of 27, serum creatinine of 2.76, EGFR of 18, nonfasting blood glucose 127, WBC 7.0, hemoglobin 11, platelets of 371. Serum calcium level was greater than 15.  Lactic acid was 1.9 and on repeat was 2.4.   ED treatment: Sodium chloride 500 mL bolus.  Patient was admitted to medicine service for further workup and management of hypoxic respiratory failure, hypercalcemia as outlined in detail below.  11/30/22 -stable and improving - weaned to 1-2 L/min O2.  Still feels overall poorly.  Assessment & Plan  Principal Problem:   Hypercalcemia Active Problems:   Elevated troponin   AKI (acute kidney injury) (HCC)   Acute hypoxic respiratory failure (HCC)   Severe sepsis (HCC)   Breast cancer, stage 2, left (HCC)   Malignant neoplasm metastatic to bone (HCC)   Electrolyte abnormality   Primary hypertension   Anxiety   Gastroesophageal reflux disease without esophagitis   Mobitz type 1 second degree AV block   Pure hypercholesterolemia   Acute respiratory failure with hypoxia (HCC)   Orthostatic hypotension  Acute hypoxic respiratory failure (HCC) Multifocal pneumonia as seen on chest CT.   COVID-19 negative.  Remains on 1-2 L/min O2.  NM perfusion scan negative  for PE. BLE doppler U/S negative for DVT's bilaterally  - Treated with IV azithromycin, cefepime -- day 5 antibiotics today - will transition to PO to complete 7 day course tomorrow - vanc was discontinued with negative MRSA screen - wean oxygen as tolerated, target spO2>90%   Severe sepsis -- ruled out.  Pt met only 1 SIRS criteria (HR>90)  Lactic acidosis - resolved  AKI superimopsed on CKD stage 3A at baseline.  Cr 2.76>>2.4>>2.12>>2.15 >> 1.96 Baseline Cr around 1.2 Strict I's and O's Continue gentle IV fluids today Encourage PO hydration Avoid nephrotoxins & renally dose meds Nephrology following Daily BMP's  Hypercalcemia-  Treated with NS 500 mL bolus followed by maintenance NS, Zoledronic acid 4 mg IV. Ca++ >15 and improved to 10.9 today - nephrology following - on IVF's for AKI - daily BMPs  Hypokalemia - replaced & resolved. K 3.5 this AM Monitor BMP's  Hypomagnesemia - replaced & resolved. Follow Mg levels   Elevated troponin Likely demand ischemia in setting of acute hypoxic respiratory failure Low clinical suspicion for ACS with flat & minimally elevated troponin   Breast cancer, stage 2, left (HCC) Continue outpatient follow-up with medical oncology as appropriate - brain MRI on admission negative for acute intracranial abnormalities. Bone metastasis shown present and similar to prior imaging. Patient informed and she stated she was unaware of these lesions previously [name] aware of admission   Primary hypertension Home meds held on admission to avoid hypotension, resume when appropriate. Monitor BP  Hypothyroidism - continue levothyroxine  Pure hypercholesterolemia Continue home rosuvastatin    Anxiety- continue home meds  Body mass index is 26.63 kg/m.  VTE ppx: Place TED hose Start: 11/26/22 1535  Diet:     Diet   Diet regular Room service appropriate? Yes; Fluid consistency: Thin   Consultants: Nephrology   Subjective  11/30/22    Pt reports feeling tired but overall better.  No fever/chills, SOB at rest.  Intermittent cough.  PO intake is okay, fair.  No other acute complaints.   Objective   Vitals:   11/30/22 0037 11/30/22 0813 11/30/22 1245 11/30/22 1538  BP: (!) 119/56 122/62 (!) 140/65 (!) 140/67  Pulse: 86 83 82 93  Resp: 17 12 15 15   Temp: 98.6 F (37 C) 98.4 F (36.9 C) 98.1 F (36.7 C) 98.7 F (37.1 C)  TempSrc: Oral     SpO2: 94% 94% 94% 93%  Weight:      Height:        Intake/Output Summary (Last 24 hours) at 11/30/2022 1615 Last data filed at 11/30/2022 1507 Gross per 24 hour  Intake 2441.79 ml  Output 1 ml  Net 2440.79 ml   Filed Weights   11/26/22 1226  Weight: 74.8 kg     Physical Exam:  General exam: awake, alert, no acute distress HEENT: moist mucus membranes, hearing grossly normal  Respiratory system: lungs clear, slightly diminished bases, no wheezes or rhonchi, normal respiratory effort on room air Cardiovascular system: normal S1/S2, RRR, no pedal edema.   Gastrointestinal system: soft, NT, ND Central nervous system: A&O x3. no gross focal neurologic deficits, normal speech Extremities: moves all , no edema, normal tone Skin: dry, intact, normal temperature Psychiatry: normal mood, congruent affect, judgement and insight appear normal   Labs   I have personally reviewed the following labs and imaging studies CBC    Component Value Date/Time   WBC 6.4 11/29/2022 0557   RBC 3.68 (L) 11/29/2022 0557   HGB 9.7 (L) 11/29/2022 0557   HGB 10.5 (L) 10/08/2022 0902   HGB 14.2 02/11/2011 1349   HCT 29.7 (L) 11/29/2022 0557   HCT 40.9 02/11/2011 1349   PLT 282 11/29/2022 0557   PLT 249 10/08/2022 0902   PLT 151 02/11/2011 1349   MCV 80.7 11/29/2022 0557   MCV 89 02/11/2011 1349   MCH 26.4 11/29/2022 0557   MCHC 32.7 11/29/2022 0557   RDW 19.8 (H) 11/29/2022 0557   RDW 13.2 02/11/2011 1349   LYMPHSABS 0.2 (L) 11/15/2022 0919   MONOABS 0.5 11/15/2022 0919    EOSABS 0.1 11/15/2022 0919   BASOSABS 0.0 11/15/2022 0919      Latest Ref Rng & Units 11/30/2022    5:42 AM 11/29/2022    5:57 AM 11/28/2022    5:11 AM  BMP  Glucose 70 - 99 mg/dL 96  829  96   BUN 8 - 23 mg/dL 25  22  24    Creatinine 0.44 - 1.00 mg/dL 5.62  1.30  8.65   Sodium 135 - 145 mmol/L 139  137  138   Potassium 3.5 - 5.1 mmol/L 3.5  3.4  2.5   Chloride 98 - 111 mmol/L 105  102  100   CO2 22 - 32 mmol/L 22  24  24    Calcium 8.9 - 10.3 mg/dL 78.4  69.6  29.5     No results found.  Disposition Plan & Communication  Patient status: Inpatient  Admitted From: Home Planned disposition location: Home Anticipated discharge date: 11/15-16  pending clinical improvement  Family Communication: none at bedside.  Son updated at bedside 11/14, daughter on 11/13   Author: Pennie Banter, DO [facility] 11/30/2022, 4:15 PM   Available by Epic secure chat 7AM-7PM. If 7PM-7AM, please contact night-coverage.  [facility] contact information found on [email or web address].

## 2022-12-01 LAB — BASIC METABOLIC PANEL
Anion gap: 12 (ref 5–15)
BUN: 25 mg/dL — ABNORMAL HIGH (ref 8–23)
CO2: 23 mmol/L (ref 22–32)
Calcium: 10.9 mg/dL — ABNORMAL HIGH (ref 8.9–10.3)
Chloride: 105 mmol/L (ref 98–111)
Creatinine, Ser: 1.7 mg/dL — ABNORMAL HIGH (ref 0.44–1.00)
GFR, Estimated: 31 mL/min — ABNORMAL LOW (ref >60)
Glucose, Bld: 94 mg/dL (ref 70–99)
Potassium: 3.4 mmol/L — ABNORMAL LOW (ref 3.5–5.1)
Sodium: 140 mmol/L (ref 135–145)

## 2022-12-01 LAB — PHOSPHORUS: Phosphorus: 2.2 mg/dL — ABNORMAL LOW (ref 2.5–4.6)

## 2022-12-01 LAB — CULTURE, BLOOD (ROUTINE X 2)
Culture: NO GROWTH
Culture: NO GROWTH
Special Requests: ADEQUATE
Special Requests: ADEQUATE

## 2022-12-01 LAB — MAGNESIUM: Magnesium: 1.6 mg/dL — ABNORMAL LOW (ref 1.7–2.4)

## 2022-12-01 MED ORDER — MAGNESIUM SULFATE 2 GM/50ML IV SOLN
2.0000 g | Freq: Once | INTRAVENOUS | Status: AC
  Administered 2022-12-01: 2 g via INTRAVENOUS
  Filled 2022-12-01: qty 50

## 2022-12-01 MED ORDER — POTASSIUM CHLORIDE 20 MEQ PO PACK
40.0000 meq | PACK | ORAL | Status: AC
Start: 2022-12-01 — End: 2022-12-01
  Administered 2022-12-01 (×2): 40 meq via ORAL
  Filled 2022-12-01 (×2): qty 2

## 2022-12-01 MED ORDER — MIDODRINE HCL 5 MG PO TABS: 2.5000 mg | ORAL_TABLET | Freq: Three times a day (TID) | ORAL | Status: DC

## 2022-12-01 MED ORDER — MIDODRINE HCL 5 MG PO TABS
5.0000 mg | ORAL_TABLET | Freq: Three times a day (TID) | ORAL | Status: DC
  Administered 2022-12-02 (×2): 5 mg via ORAL
  Filled 2022-12-01 (×2): qty 1

## 2022-12-01 MED ORDER — MAGNESIUM CHLORIDE 64 MG PO TBEC
1.0000 | DELAYED_RELEASE_TABLET | Freq: Every day | ORAL | Status: DC
  Administered 2022-12-02 – 2022-12-03 (×2): 64 mg via ORAL
  Filled 2022-12-01 (×2): qty 1

## 2022-12-01 MED ORDER — K PHOS MONO-SOD PHOS DI & MONO 155-852-130 MG PO TABS
250.0000 mg | ORAL_TABLET | Freq: Three times a day (TID) | ORAL | Status: AC
  Administered 2022-12-01 – 2022-12-02 (×3): 250 mg via ORAL
  Filled 2022-12-01 (×3): qty 1

## 2022-12-01 MED ORDER — SODIUM CHLORIDE 0.9 % IV SOLN
2.0000 g | Freq: Two times a day (BID) | INTRAVENOUS | Status: AC
  Administered 2022-12-01 – 2022-12-02 (×3): 2 g via INTRAVENOUS
  Filled 2022-12-01 (×3): qty 12.5

## 2022-12-01 NOTE — Progress Notes (Signed)
PROGRESS NOTE  Alicia [name]    DOB: 28-Jul-1950, 72 y.o.  ZOX:[id number]    Code Status: Full Code   DOA: 11/26/2022   LOS: 5   Brief hospital course  Ms. 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  They presented to ED with acute hypoxic respiratory failure.   ED course: temp 97.9, respiration rate of 18, heart rate of 93, blood pressure 122/81, SpO2 of 92% on 2 L nasal cannula. (72% on room air with no baseline O2 requirements)   Serum sodium is 137, potassium 4.3, chloride 98, bicarb 29, BUN of 27, serum creatinine of 2.76, EGFR of 18, nonfasting blood glucose 127, WBC 7.0, hemoglobin 11, platelets of 371. Serum calcium level was greater than 15.  Lactic acid was 1.9 and on repeat was 2.4.   ED treatment: Sodium chloride 500 mL bolus.  Patient was admitted to medicine service for further workup and management of hypoxic respiratory failure, hypercalcemia as outlined in detail below.  12/01/22 -stable and improving - weaned to 1-2 L/min O2.  Still feels overall poorly.  Assessment & Plan  Principal Problem:   Hypercalcemia Active Problems:   Elevated troponin   AKI (acute kidney injury) (HCC)   Acute hypoxic respiratory failure (HCC)   Severe sepsis (HCC)   Breast cancer, stage 2, left (HCC)   Malignant neoplasm metastatic to bone (HCC)   Electrolyte abnormality   Primary hypertension   Anxiety   Gastroesophageal reflux disease without esophagitis   Mobitz type 1 second degree AV block   Pure hypercholesterolemia   Acute respiratory failure with hypoxia (HCC)   Orthostatic hypotension  Acute hypoxic respiratory failure (HCC) Multifocal pneumonia as seen on chest CT.   COVID-19 negative.  Remains on 1-2 L/min O2.  NM perfusion scan negative  for PE. BLE doppler U/S negative for DVT's bilaterally  - Treated with IV azithromycin, cefepime -- day 5 antibiotics today - will transition to PO to complete 7 day course tomorrow - vanc was discontinued with negative MRSA screen - wean oxygen as tolerated, target spO2>90%   Severe sepsis -- ruled out.  Pt met only 1 SIRS criteria (HR>90)  Lactic acidosis - resolved  Orthostatic hypotension -- see therapy notes for readings.  Pt symptomatic with dizziness/lightheadedness and positive orthostatic vitals. --Start midodrine 5 mg TID --Remains on IVF for AKI   AKI superimopsed on CKD stage 3A at baseline.  Cr 2.76>>2.4>>2.12>>2.15 >> 1.96 >> 1.70 Baseline Cr around 1.2 --Strict I's and O's --Continue gentle IV fluids today --Encourage PO hydration --Avoid nephrotoxins & renally dose meds --Nephrology following --Daily BMP's  Hypercalcemia-  Treated with NS 500 mL bolus followed by maintenance NS, Zoledronic acid 4 mg IV. Ca++ >15 and improved to 10.9 today - nephrology following - on IVF's for AKI - daily BMPs  Hypokalemia - replaced & resolved. K 3.4 this AM, 40 mEq ordered to replace Monitor BMP's Will d/c on low dose daily K-cl  Hypomagnesemia - recurrent, Mg 1.6 2g IV Mg-sulfate today Start PO slow-Mg tomorrow Follow Mg levels   Elevated troponin Likely demand ischemia in setting of acute hypoxic respiratory failure Low clinical suspicion for ACS with flat & minimally elevated troponin   Breast cancer, stage 2, left (HCC) Continue outpatient follow-up with medical oncology as appropriate - brain MRI on admission negative for acute  intracranial abnormalities. Bone metastasis shown present and similar to prior imaging. Patient informed and she stated she was unaware of these lesions previously [name] aware of admission   Primary hypertension Home meds held on admission to avoid hypotension, resume when appropriate. Monitor BP  Hypothyroidism - continue  levothyroxine    Pure hypercholesterolemia Continue home rosuvastatin    Anxiety- continue home meds  Body mass index is 26.63 kg/m.  VTE ppx: Place TED hose Start: 11/26/22 1535  Diet:     Diet   Diet regular Room service appropriate? Yes; Fluid consistency: Thin   Consultants: Nephrology   Subjective 12/01/22    Pt seen with son at bedside this AM. She reports feeling little better.  Still feels quite weak, and concerned about 16 steps to get to her bedroom at home.  Reports dizzy/lightheadedness when up.     Objective   Vitals:   11/30/22 2317 12/01/22 0522 12/01/22 0820 12/01/22 1217  BP: (!) 129/54 128/69 136/67 (!) 116/52  Pulse: 88 89 81 76  Resp: 18 16 20 20   Temp: 99.3 F (37.4 C) 98.4 F (36.9 C) 98.4 F (36.9 C) 98.4 F (36.9 C)  TempSrc: Oral Oral Oral Oral  SpO2: 94% 93% 94% 94%  Weight:      Height:        Intake/Output Summary (Last 24 hours) at 12/01/2022 1632 Last data filed at 12/01/2022 1127 Gross per 24 hour  Intake 2117.83 ml  Output --  Net 2117.83 ml   Filed Weights   11/26/22 1226  Weight: 74.8 kg     Physical Exam:  General exam: awake, alert, no acute distress HEENT: moist mucus membranes, hearing grossly normal  Respiratory system: lungs clear, no wheezes or rhonchi, normal respiratory effort on room air Cardiovascular system: normal S1/S2, RRR, no pedal edema.   Gastrointestinal system: soft, NT, ND Central nervous system: A&O x3. no gross focal neurologic deficits, normal speech Extremities: moves all , no edema, normal tone Skin: dry, intact, normal temperature Psychiatry: normal mood, congruent affect, judgement and insight appear normal   Labs   I have personally reviewed the following labs and imaging studies CBC    Component Value Date/Time   WBC 6.4 11/29/2022 0557   RBC 3.68 (L) 11/29/2022 0557   HGB 9.7 (L) 11/29/2022 0557   HGB 10.5 (L) 10/08/2022 0902   HGB 14.2 02/11/2011 1349   HCT 29.7 (L) 11/29/2022  0557   HCT 40.9 02/11/2011 1349   PLT 282 11/29/2022 0557   PLT 249 10/08/2022 0902   PLT 151 02/11/2011 1349   MCV 80.7 11/29/2022 0557   MCV 89 02/11/2011 1349   MCH 26.4 11/29/2022 0557   MCHC 32.7 11/29/2022 0557   RDW 19.8 (H) 11/29/2022 0557   RDW 13.2 02/11/2011 1349   LYMPHSABS 0.2 (L) 11/15/2022 0919   MONOABS 0.5 11/15/2022 0919   EOSABS 0.1 11/15/2022 0919   BASOSABS 0.0 11/15/2022 0919      Latest Ref Rng & Units 12/01/2022    5:27 AM 11/30/2022    5:42 AM 11/29/2022    5:57 AM  BMP  Glucose 70 - 99 mg/dL 94  96  970   BUN 8 - 23 mg/dL 25  25  22    Creatinine 0.44 - 1.00 mg/dL 2.63  7.85  8.85   Sodium 135 - 145 mmol/L 140  139  137   Potassium 3.5 - 5.1 mmol/L 3.4  3.5  3.4   Chloride 98 - 111  mmol/L 105  105  102   CO2 22 - 32 mmol/L 23  22  24    Calcium 8.9 - 10.3 mg/dL 40.9  81.1  91.4     No results found.  Disposition Plan & Communication  Patient status: Inpatient  Admitted From: Home Planned disposition location: Home Anticipated discharge date: 11/17 pending clinical improvement.    Barrier -- persistent AKI, symptomatic orthostatic hypotension  Family Communication: Son updated at bedside on rounds   Author: Pennie Banter, DO [facility] 12/01/2022, 4:32 PM   Available by Epic secure chat 7AM-7PM. If 7PM-7AM, please contact night-coverage.  [facility] contact information found on [email or web address].

## 2022-12-01 NOTE — Progress Notes (Signed)
Pharmacy Antibiotic Note  Alicia [name] is a 72 y.o. female admitted on 11/26/2022 with sepsis/multifocal PNA. PMH significant for invasive carcinoma of the breast with bony metastasis s/p chemotherapy and left mastectomy, hypertension, depression, and anxiety. Patient is afebrile with no leukocytosis. Pharmacy has been consulted for cefepime dosing.  Plan: Day 5 antibiotics   cefepime 2 g IV every 12 hours for Crcl 30.5 ml/min Follow up with planned duration Also on zithromax 500 mg daily --> end date 11/16  Scr 2.15 >> 1.70  improving   Monitor renal function, clinical status, culture data, and LOT  Height: 5\' 6"  (167.6 cm) Weight: 74.8 kg (165 lb) IBW/kg (Calculated) : 59.3  Temp (24hrs), Avg:98.7 F (37.1 C), Min:98.1 F (36.7 C), Max:99.3 F (37.4 C)  Recent Labs  Lab 11/26/22 1228 11/26/22 1506 11/26/22 1725 11/26/22 2020 11/27/22 0430 11/28/22 0511 11/29/22 0557 11/30/22 0542 12/01/22 0527  WBC 7.0  --   --   --  7.5 7.8 6.4  --   --   CREATININE 2.76*  --   --   --  2.44* 2.12* 2.15* 1.96* 1.70*  LATICACIDVEN 1.9 2.4* 1.7 2.4* 0.8  --   --   --   --     Estimated Creatinine Clearance: 30.5 mL/min (A) (by C-G formula based on SCr of 1.7 mg/dL (H)).    Allergies  Allergen Reactions   Sulfa Antibiotics Diarrhea   Antimicrobials this admission: vancomycin 11/11 (evening)>> 11/12 cefepime 11/11 (evening)>>  Azithromycin 11/11(evening)>>  Dose adjustments this admission: N/A  Microbiology results: 11/11 BCx: NGTD  11/12 MRSA nares: negative  Thank you for involving pharmacy in this patient's care.   Richell Corker A, PharmD 12/01/2022 10:03 AM

## 2022-12-01 NOTE — Plan of Care (Signed)

## 2022-12-01 NOTE — Progress Notes (Signed)
Physical Therapy Treatment Patient Details Name: Alicia [name] MRN: [id number] DOB: 06-30-1950 Today's Date: 12/01/2022   History of Present Illness 72 year old female with PMHx of left breast carcinoma with bony metastasis, s/p chemotherapy and L mastectomy prior radiation therapy, hypertension, depression, anxiety, hypothyroid, who presents to the ED for chief concerns of acute hypoxic respiratory failure, hypercalcemia. No PE.    PT Comments  Pt received in Semi-Fowler's position and agreeable to therapy.  Pt reports she has been trying to sit up a little more today in order to help with orthostatics.  Pt agreed to have them measured again and are noted below:  Orthostatic VS for the past 24 hrs:  BP- Lying Pulse- Lying BP- Sitting Pulse- Sitting BP- Standing at 0 minutes Pulse- Standing at 0 minutes BP- Standing at 3 minutes Pulse- Standing at 3 minutes  12/01/22 1619 132/60 82 130/67 89 105/61 103 110/61 110   Pt continues to be orthostatic and MD notified.  Pt also noted to have significant weakness in the LE's upon end of session and elected to get back in bed.  Pt placed in chair mode while sitting in the bed in order to assist with orthostatics.  Pt left with all needs met and call bell within reach.     If plan is discharge home, recommend the following: A little help with walking and/or transfers;A little help with bathing/dressing/bathroom   Can travel by private vehicle        Equipment Recommendations  Rolling walker (2 wheels)    Recommendations for Other Services       Precautions / Restrictions Precautions Precautions: Fall Restrictions Weight Bearing Restrictions: No     Mobility  Bed Mobility Overal bed mobility: Modified Independent             General bed mobility comments: supine>sit to exit R side of bed    Transfers Overall transfer level: Needs assistance Equipment used: Rolling walker (2 wheels) Transfers: Sit to/from Stand Sit to  Stand: Contact guard assist                Ambulation/Gait         Gait velocity: decreased     General Gait Details: deferred due to continued orthostatic vitals and pt noting significant weakness in the LE's.   Stairs             Wheelchair Mobility     Tilt Bed    Modified Rankin (Stroke Patients Only)       Balance Overall balance assessment: Needs assistance Sitting-balance support: No upper extremity supported, Feet supported Sitting balance-Leahy Scale: Good     Standing balance support: No upper extremity supported Standing balance-Leahy Scale: Fair                              Cognition Arousal: Alert Behavior During Therapy: Anxious, WFL for tasks assessed/performed Overall Cognitive Status: Within Functional Limits for tasks assessed                                          Exercises      General Comments        Pertinent Vitals/Pain Pain Assessment Pain Assessment: No/denies pain    Home Living  Prior Function            PT Goals (current goals can now be found in the care plan section) Acute Rehab PT Goals Patient Stated Goal: to get stronger and feel better PT Goal Formulation: With patient Time For Goal Achievement: 12/13/22 Potential to Achieve Goals: Good Progress towards PT goals: Progressing toward goals    Frequency    Min 1X/week      PT Plan      Co-evaluation              AM-PAC PT "6 Clicks" Mobility   Outcome Measure  Help needed turning from your back to your side while in a flat bed without using bedrails?: None Help needed moving from lying on your back to sitting on the side of a flat bed without using bedrails?: None Help needed moving to and from a bed to a chair (including a wheelchair)?: A Little Help needed standing up from a chair using your arms (e.g., wheelchair or bedside chair)?: A Little Help needed to walk in  hospital room?: A Little Help needed climbing 3-5 steps with a railing? : A Little 6 Click Score: 20    End of Session Equipment Utilized During Treatment: Gait belt Activity Tolerance: Patient limited by fatigue;Treatment limited secondary to medical complications (Comment) Patient left: in bed;with call bell/phone within reach;with bed alarm set Nurse Communication: Mobility status PT Visit Diagnosis: Muscle weakness (generalized) (M62.81);Unsteadiness on feet (R26.81);Difficulty in walking, not elsewhere classified (R26.2)     Time: 4098-1191 PT Time Calculation (min) (ACUTE ONLY): 18 min  Charges:    $Therapeutic Activity: 8-22 mins PT General Charges $$ ACUTE PT VISIT: 1 Visit                     Nolon Bussing, PT, DPT Physical Therapist - [facility]  12/01/22, 4:22 PM

## 2022-12-02 LAB — BASIC METABOLIC PANEL
Anion gap: 8 (ref 5–15)
BUN: 25 mg/dL — ABNORMAL HIGH (ref 8–23)
CO2: 25 mmol/L (ref 22–32)
Calcium: 10.1 mg/dL (ref 8.9–10.3)
Chloride: 106 mmol/L (ref 98–111)
Creatinine, Ser: 1.54 mg/dL — ABNORMAL HIGH (ref 0.44–1.00)
GFR, Estimated: 35 mL/min — ABNORMAL LOW (ref >60)
Glucose, Bld: 107 mg/dL — ABNORMAL HIGH (ref 70–99)
Potassium: 3.6 mmol/L (ref 3.5–5.1)
Sodium: 139 mmol/L (ref 135–145)

## 2022-12-02 LAB — MAGNESIUM: Magnesium: 1.7 mg/dL (ref 1.7–2.4)

## 2022-12-02 MED ORDER — POTASSIUM CHLORIDE 20 MEQ PO PACK
20.0000 meq | PACK | Freq: Every day | ORAL | Status: DC
  Administered 2022-12-02: 20 meq via ORAL
  Filled 2022-12-02: qty 1

## 2022-12-02 MED ORDER — MIDODRINE HCL 5 MG PO TABS
10.0000 mg | ORAL_TABLET | Freq: Three times a day (TID) | ORAL | Status: DC
  Administered 2022-12-02 – 2022-12-03 (×3): 10 mg via ORAL
  Filled 2022-12-02 (×3): qty 2

## 2022-12-02 NOTE — Progress Notes (Addendum)
PROGRESS NOTE  Alicia [name]    DOB: 04-23-1950, 72 y.o.  WUJ:[id number]    Code Status: Full Code   DOA: 11/26/2022   LOS: 6   Brief hospital course  Ms. 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  They presented to ED with acute hypoxic respiratory failure.   ED course: temp 97.9, respiration rate of 18, heart rate of 93, blood pressure 122/81, SpO2 of 92% on 2 L nasal cannula. (72% on room air with no baseline O2 requirements)   Serum sodium is 137, potassium 4.3, chloride 98, bicarb 29, BUN of 27, serum creatinine of 2.76, EGFR of 18, nonfasting blood glucose 127, WBC 7.0, hemoglobin 11, platelets of 371. Serum calcium level was greater than 15.  Lactic acid was 1.9 and on repeat was 2.4.   ED treatment: Sodium chloride 500 mL bolus.  Patient was admitted to medicine service for further workup and management of hypoxic respiratory failure, hypercalcemia as outlined in detail below.  12/02/22 -stable and improving - weaned to 1-2 L/min O2.  Still feels overall poorly.  Assessment & Plan  Principal Problem:   Hypercalcemia Active Problems:   Elevated troponin   AKI (acute kidney injury) (HCC)   Acute hypoxic respiratory failure (HCC)   Severe sepsis (HCC)   Breast cancer, stage 2, left (HCC)   Malignant neoplasm metastatic to bone (HCC)   Electrolyte abnormality   Primary hypertension   Anxiety   Gastroesophageal reflux disease without esophagitis   Mobitz type 1 second degree AV block   Pure hypercholesterolemia   Acute respiratory failure with hypoxia (HCC)   Orthostatic hypotension  Acute hypoxic respiratory failure - resolved Multifocal pneumonia as seen on chest CT.   COVID-19 negative.  Now weaned to room air. NM perfusion scan  negative for PE. BLE doppler U/S negative for DVT's bilaterally  - Competed 7 day course with azithromycin, cefepime  - vanc was discontinued with negative MRSA screen - wean oxygen as tolerated, target spO2>90%   Severe sepsis -- ruled out.  Pt met only 1 SIRS criteria (HR>90)  Lactic acidosis - resolved  Orthostatic hypotension -- see therapy notes for readings.  Pt symptomatic with dizziness/lightheadedness and positive orthostatic vitals. 11/17 - still + orthostatics and pt symptomatic --Increase midodrine to 10 mg TID --Remains on IVF for AKI   AKI superimopsed on CKD stage 3A at baseline.  Cr 2.76>>2.4>>2.12>>2.15 >> 1.96 >> 1.70 >> 1.54 Baseline Cr around 1.2 --Strict I's and O's --Continue gentle IV fluids today --Encourage PO hydration --Avoid nephrotoxins & renally dose meds --Nephrology following --Daily BMP's  Hypercalcemia-  Treated with NS 500 mL bolus followed by maintenance NS, Zoledronic acid 4 mg IV. Ca++ >15 and improved to 10.1 today - nephrology following - on IVF's for AKI - daily BMPs  Hypokalemia - replaced & resolved. Monitor BMP's Low dose daily K-cl  Hypomagnesemia - resolved Daily PO slow-Mg tomorrow Follow Mg levels  Hypophosphatemia - phos 2.1 Replacing with PO K-phos Monitor phos levels   Elevated troponin Likely demand ischemia in setting of acute hypoxic respiratory failure Low clinical suspicion for ACS with flat & minimally elevated troponin   Breast cancer, stage 2, left (HCC) Continue outpatient follow-up with medical oncology as appropriate - brain MRI on admission negative for acute intracranial abnormalities. Bone metastasis shown present and similar  to prior imaging. Patient informed and she stated she was unaware of these lesions previously [name] aware of admission   Primary hypertension Home meds held on admission to avoid hypotension, resume when appropriate. Monitor BP  Hypothyroidism - continue  levothyroxine    Pure hypercholesterolemia Continue home rosuvastatin    Anxiety- continue home meds  Body mass index is 26.63 kg/m.  VTE ppx: Place TED hose Start: 11/26/22 1535  Diet:     Diet   Diet regular Room service appropriate? Yes; Fluid consistency: Thin   Consultants: Nephrology   Subjective 12/02/22    Pt up in recliner when seen this AM.  Reports overall feeling better, but still feels quite weak.  Pt gets dizzy and lightheaded when up, and still has +orthostatic BP's today with persistent symptoms despite midodrine started yesterday.    Objective   Vitals:   12/01/22 1643 12/01/22 1946 12/01/22 2352 12/02/22 1104  BP: 130/60 (!) 127/54 (!) 151/72 (!) 126/54  Pulse: 84 83 89 75  Resp: 20 16 (!) 22 20  Temp: 98.6 F (37 C) 98.1 F (36.7 C) 98.4 F (36.9 C) 98.6 F (37 C)  TempSrc: Oral   Oral  SpO2: 97% 95% 95% 96%  Weight:      Height:        Intake/Output Summary (Last 24 hours) at 12/02/2022 1406 Last data filed at 12/02/2022 1139 Gross per 24 hour  Intake 821.33 ml  Output --  Net 821.33 ml   Filed Weights   11/26/22 1226  Weight: 74.8 kg     Physical Exam:  General exam: awake, alert, no acute distress, chronically ill appearing Respiratory system: CTAB, normal respiratory effort on room air Cardiovascular system: normal S1/S2, RRR, no pedal edema.   Gastrointestinal system: soft, NT, ND Central nervous system: A&O x3. no gross focal neurologic deficits, normal speech Extremities: moves all , no edema, normal tone Skin: dry, intact, normal temperature Psychiatry: normal mood, congruent affect, judgement and insight appear normal   Labs   I have personally reviewed the following labs and imaging studies CBC    Component Value Date/Time   WBC 6.4 11/29/2022 0557   RBC 3.68 (L) 11/29/2022 0557   HGB 9.7 (L) 11/29/2022 0557   HGB 10.5 (L) 10/08/2022 0902   HGB 14.2 02/11/2011 1349   HCT 29.7 (L) 11/29/2022 0557   HCT 40.9  02/11/2011 1349   PLT 282 11/29/2022 0557   PLT 249 10/08/2022 0902   PLT 151 02/11/2011 1349   MCV 80.7 11/29/2022 0557   MCV 89 02/11/2011 1349   MCH 26.4 11/29/2022 0557   MCHC 32.7 11/29/2022 0557   RDW 19.8 (H) 11/29/2022 0557   RDW 13.2 02/11/2011 1349   LYMPHSABS 0.2 (L) 11/15/2022 0919   MONOABS 0.5 11/15/2022 0919   EOSABS 0.1 11/15/2022 0919   BASOSABS 0.0 11/15/2022 0919      Latest Ref Rng & Units 12/02/2022    3:45 AM 12/01/2022    5:27 AM 11/30/2022    5:42 AM  BMP  Glucose 70 - 99 mg/dL 782  94  96   BUN 8 - 23 mg/dL 25  25  25    Creatinine 0.44 - 1.00 mg/dL 9.56  2.13  0.86   Sodium 135 - 145 mmol/L 139  140  139   Potassium 3.5 - 5.1 mmol/L 3.6  3.4  3.5   Chloride 98 - 111 mmol/L 106  105  105   CO2 22 - 32 mmol/L  25  23  22    Calcium 8.9 - 10.3 mg/dL 84.6  96.2  95.2     No results found.  Disposition Plan & Communication  Patient status: Inpatient  Admitted From: Home Planned disposition location: Home Anticipated discharge date: 11/18 pending clinical improvement.    Barrier -- ongoing symptomatic orthostatic hypotension  Family Communication: Son updated at bedside on rounds   Author: Pennie Banter, DO [facility] 12/02/2022, 2:06 PM   Available by Epic secure chat 7AM-7PM. If 7PM-7AM, please contact night-coverage.  [facility] contact information found on [email or web address].

## 2022-12-02 NOTE — Progress Notes (Signed)
Physical Therapy Treatment Patient Details Name: Alicia [name] MRN: [id number] DOB: [birth year] Today's Date: 12/02/2022   History of Present Illness 72 year old female with PMHx of left breast carcinoma with bony metastasis, s/p chemotherapy and L mastectomy prior radiation therapy, hypertension, depression, anxiety, hypothyroid, who presents to the ED for chief concerns of acute hypoxic respiratory failure, hypercalcemia. No PE.    PT Comments  Pt seen for PT tx with pt agreeable. Pt is able to complete bed mobility with mod I, STS with supervision, & ambulate in room & bathroom with RW & supervision<>CGA. Pt does endorse dizziness/lightheadedness with standing position that limits gait/mobility. PT educated pt on recommendation for assistance at home upon d/c with pt reporting her daughter-in-law sometimes works from home but she's unsure of her current schedule. Notified RN of pt's ongoing symptomatic orthostatic hypotension & nurse reports she will assess orthostatics again later today.    BP checked in RUE  BP HR  Supine  135/61 mmHg MAP 83 88 bpm  Sitting 130/66 mmHg MAP 84 88 bpm  Standing at 0 103/58 mmHg MAP 71 105 bpm  Standing at 3 minutes 117/64 mmHg MAP 81 107 bpm  Standing after toileting - pt with c/o L side of head "pounding" 100/53 mmHg MAP 62 115  bpm  Sitting in recliner at end of session, BLE elevated 127/62 mmHg MAP 81 HR 94 bpm      If plan is discharge home, recommend the following: A little help with walking and/or transfers;A little help with bathing/dressing/bathroom;Help with stairs or ramp for entrance;Assist for transportation;Assistance with cooking/housework   Can travel by private Automotive engineer (2 wheels)    Recommendations for Other Services       Precautions / Restrictions Precautions Precautions: Fall Precaution Comments: watch BP Restrictions Weight Bearing Restrictions: No     Mobility  Bed  Mobility Overal bed mobility: Modified Independent Bed Mobility: Supine to Sit     Supine to sit: Modified independent (Device/Increase time), HOB elevated, Used rails     General bed mobility comments: supine>sit to exit R side of bed    Transfers Overall transfer level: Needs assistance Equipment used: Rolling walker (2 wheels) Transfers: Sit to/from Stand Sit to Stand: Supervision           General transfer comment: Good demo of hand placement during STS, pushing to standing. STS from low toilet with supervision as well.    Ambulation/Gait Ambulation/Gait assistance: Supervision, Contact guard assist Gait Distance (Feet): 10 Feet (+ 10 ft + 7 ft) Assistive device: Rolling walker (2 wheels) Gait Pattern/deviations: Decreased stride length, Decreased step length - right, Decreased step length - left Gait velocity: decreased     General Gait Details: Education re: proper use of AD   Stairs             Wheelchair Mobility     Tilt Bed    Modified Rankin (Stroke Patients Only)       Balance Overall balance assessment: Needs assistance Sitting-balance support: No upper extremity supported, Feet supported Sitting balance-Leahy Scale: Good     Standing balance support: During functional activity, No upper extremity supported Standing balance-Leahy Scale: Fair                              Cognition Arousal: Alert Behavior During Therapy: WFL for tasks assessed/performed Overall Cognitive Status: Within Functional Limits  for tasks assessed                                          Exercises      General Comments General comments (skin integrity, edema, etc.): Pt with continent void on toilet.      Pertinent Vitals/Pain Pain Assessment Pain Assessment: No/denies pain    Home Living                          Prior Function            PT Goals (current goals can now be found in the care plan section)  Acute Rehab PT Goals PT Goal Formulation: With patient Time For Goal Achievement: 12/13/22 Potential to Achieve Goals: Good Progress towards PT goals: Progressing toward goals    Frequency    Min 1X/week      PT Plan      Co-evaluation              AM-PAC PT "6 Clicks" Mobility   Outcome Measure  Help needed turning from your back to your side while in a flat bed without using bedrails?: None Help needed moving from lying on your back to sitting on the side of a flat bed without using bedrails?: None Help needed moving to and from a bed to a chair (including a wheelchair)?: A Little Help needed standing up from a chair using your arms (e.g., wheelchair or bedside chair)?: A Little Help needed to walk in hospital room?: A Little Help needed climbing 3-5 steps with a railing? : A Little 6 Click Score: 20    End of Session   Activity Tolerance: Patient limited by fatigue;Treatment limited secondary to medical complications (Comment) (limited by orthostatic hypotension, pt symptomatic) Patient left: in chair;with call bell/phone within reach Nurse Communication: Mobility status (BP) PT Visit Diagnosis: Muscle weakness (generalized) (M62.81);Unsteadiness on feet (R26.81);Difficulty in walking, not elsewhere classified (R26.2)     Time: 0939-1000 PT Time Calculation (min) (ACUTE ONLY): 21 min  Charges:    $Therapeutic Activity: 8-22 mins PT General Charges $$ ACUTE PT VISIT: 1 Visit                     Aleda Grana, PT, DPT 12/02/22, 10:11 AM    Sandi Mariscal 12/02/2022, 10:08 AM

## 2022-12-03 ENCOUNTER — Other Ambulatory Visit (HOSPITAL_COMMUNITY): Payer: Self-pay

## 2022-12-03 LAB — BASIC METABOLIC PANEL
Anion gap: 12 (ref 5–15)
BUN: 19 mg/dL (ref 8–23)
CO2: 22 mmol/L (ref 22–32)
Calcium: 9.5 mg/dL (ref 8.9–10.3)
Chloride: 107 mmol/L (ref 98–111)
Creatinine, Ser: 1.14 mg/dL — ABNORMAL HIGH (ref 0.44–1.00)
GFR, Estimated: 51 mL/min — ABNORMAL LOW (ref >60)
Glucose, Bld: 94 mg/dL (ref 70–99)
Potassium: 3.3 mmol/L — ABNORMAL LOW (ref 3.5–5.1)
Sodium: 141 mmol/L (ref 135–145)

## 2022-12-03 LAB — MAGNESIUM: Magnesium: 1.7 mg/dL (ref 1.7–2.4)

## 2022-12-03 LAB — PHOSPHORUS: Phosphorus: 2.5 mg/dL (ref 2.5–4.6)

## 2022-12-03 MED ORDER — POTASSIUM CHLORIDE 20 MEQ PO PACK
40.0000 meq | PACK | Freq: Every day | ORAL | Status: DC
  Administered 2022-12-03: 40 meq via ORAL
  Filled 2022-12-03: qty 2

## 2022-12-03 MED ORDER — ACETAMINOPHEN 325 MG PO TABS
650.0000 mg | ORAL_TABLET | Freq: Four times a day (QID) | ORAL | Status: DC | PRN
  Administered 2022-12-03: 650 mg via ORAL
  Filled 2022-12-03: qty 2

## 2022-12-03 MED ORDER — ENSURE ENLIVE PO LIQD: 237.0000 mL | Freq: Two times a day (BID) | ORAL | Status: DC

## 2022-12-03 MED ORDER — MIDODRINE HCL 10 MG PO TABS: 10.0000 mg | ORAL_TABLET | Freq: Three times a day (TID) | ORAL | 1 refills | Status: DC

## 2022-12-03 NOTE — Progress Notes (Signed)
Physical Therapy Treatment Patient Details Name: HAYDAN STYS MRN: [id number] DOB: 01-12-1951 Today's Date: 12/03/2022   History of Present Illness 72 year old female with PMHx of left breast carcinoma with bony metastasis, s/p chemotherapy and L mastectomy prior radiation therapy, hypertension, depression, anxiety, hypothyroid, who presents to the ED for chief concerns of acute hypoxic respiratory failure, hypercalcemia. No PE.    PT Comments  Good tolerance for mobility and stair training this pm. VSS during Orthostatic BP check and asymptomatic. Pt 92% on RA after negotiating up/down steps, with slight fatigue due to endurance level. Pt feels comfortable with d/c home today and HHPT services. No DME needs at this time    If plan is discharge home, recommend the following: A little help with walking and/or transfers;A little help with bathing/dressing/bathroom;Help with stairs or ramp for entrance;Assist for transportation;Assistance with cooking/housework   Can travel by private Scientist, research (medical) walker (2 wheels)    Recommendations for Other Services       Precautions / Restrictions Precautions Precautions: Fall Precaution Comments: watch BP Restrictions Weight Bearing Restrictions: No     Mobility  Bed Mobility Overal bed mobility: Modified Independent Bed Mobility: Supine to Sit, Sit to Supine     Supine to sit: Modified independent (Device/Increase time), HOB elevated, Used rails Sit to supine: Modified independent (Device/Increase time), HOB elevated, Used rails        Transfers Overall transfer level: Needs assistance Equipment used: None Transfers: Sit to/from Stand Sit to Stand: Supervision                Ambulation/Gait Ambulation/Gait assistance: Supervision, Contact guard assist Gait Distance (Feet): 25 Feet Assistive device: Rolling walker (2 wheels) Gait Pattern/deviations: Decreased stride length,  Decreased step length - right, Decreased step length - left Gait velocity: decreased     General Gait Details: Education re: proper use of AD   Stairs Stairs: Yes Stairs assistance: Supervision Stair Management: One rail Left, Step to pattern Number of Stairs: 16 General stair comments: 92% on RA   Wheelchair Mobility     Tilt Bed    Modified Rankin (Stroke Patients Only)       Balance Overall balance assessment: Needs assistance Sitting-balance support: No upper extremity supported, Feet supported Sitting balance-Leahy Scale: Good     Standing balance support: During functional activity, Single extremity supported Standing balance-Leahy Scale: Fair Standing balance comment: no LOB standing at sink with SBA from therapist to perform oral care                            Cognition Arousal: Alert Behavior During Therapy: [facility] for tasks assessed/performed Overall Cognitive Status: Within Functional Limits for tasks assessed                                 General Comments: Occasionally anxious with new tasks        Exercises Other Exercises Other Exercises: Edu on role of PT in acute setting and importance of therapy to maximize strength and endurance to promote ability to return home safely.    General Comments        Pertinent Vitals/Pain Pain Assessment Pain Assessment: No/denies pain    Home Living  Prior Function            PT Goals (current goals can now be found in the care plan section) Acute Rehab PT Goals Patient Stated Goal: to get stronger and feel better Progress towards PT goals: Progressing toward goals    Frequency    Min 1X/week      PT Plan      Co-evaluation              AM-PAC PT "6 Clicks" Mobility   Outcome Measure  Help needed turning from your back to your side while in a flat bed without using bedrails?: None Help needed moving from lying on your  back to sitting on the side of a flat bed without using bedrails?: None Help needed moving to and from a bed to a chair (including a wheelchair)?: A Little Help needed standing up from a chair using your arms (e.g., wheelchair or bedside chair)?: A Little Help needed to walk in hospital room?: A Little Help needed climbing 3-5 steps with a railing? : A Little 6 Click Score: 20    End of Session Equipment Utilized During Treatment: Gait belt Activity Tolerance: Patient tolerated treatment well Patient left: in bed;with call bell/phone within reach;with bed alarm set Nurse Communication: Mobility status PT Visit Diagnosis: Muscle weakness (generalized) (M62.81);Unsteadiness on feet (R26.81);Difficulty in walking, not elsewhere classified (R26.2)     Time: 1400-1435 PT Time Calculation (min) (ACUTE ONLY): 35 min  Charges:    $Gait Training: 8-22 mins $Therapeutic Activity: 8-22 mins PT General Charges $$ ACUTE PT VISIT: 1 Visit                    Zadie Cleverly, PTA  Jannet Askew 12/03/2022, 3:01 PM

## 2022-12-03 NOTE — Discharge Summary (Incomplete)
Physician Discharge Summary   Patient: Alicia [name] MRN: [id number] DOB: 07-31-1950  Admit date:     11/26/2022  Discharge date: {dischdate:26783}  Discharge Physician: Pennie Banter   PCP: Corky Downs, MD   Recommendations at discharge:  {Tip this will not be part of the note when signed- Example include specific recommendations for outpatient follow-up, pending tests to follow-up on. (Optional):26781}  ***  Discharge Diagnoses: Principal Problem:   Hypercalcemia Active Problems:   Elevated troponin   AKI (acute kidney injury) (HCC)   Acute hypoxic respiratory failure (HCC)   Severe sepsis (HCC)   Breast cancer, stage 2, left (HCC)   Malignant neoplasm metastatic to bone (HCC)   Electrolyte abnormality   Primary hypertension   Anxiety   Gastroesophageal reflux disease without esophagitis   Mobitz type 1 second degree AV block   Pure hypercholesterolemia   Acute respiratory failure with hypoxia (HCC)   Orthostatic hypotension  Resolved Problems:   * No resolved hospital problems. 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  Per EDP, patient was hypoxic to 72% on room air with no baseline O2 requirements  Vitals in the ED showed temperature of 97.9, respiration rate of 18, heart rate of 93, blood pressure 122/81, SpO2 of 92% on 2 L nasal cannula.  Serum sodium is 137, potassium 4.3, chloride 98, bicarb 29, BUN of 27, serum creatinine of 2.76, EGFR of 18, nonfasting blood glucose 127, WBC 7.0, hemoglobin 11, platelets of 371.  Serum calcium level was greater than 15.  Lactic acid was 1.9 and on repeat was  2.4.  ED treatment: Sodium chloride 500 mL bolus.  Assessment and Plan: * Hypercalcemia Status post sodium chloride 500 mL bolus per EDP Obtain ionized calcium and vitamin D level prior to medication administration, discussed with nursing Continue with sodium chloride infusion at 125 mL/h, 1 day ordered Calcitonin 4 units/kg dosing, twice daily, 2 doses ordered Zoledronic acid 4 mg IV infuse over 120 minutes, 1 dose ordered Check sCa2+ every 4 hours, 3 labs check ordered Nephrology has been consulted via secure chat and Epic order Admit to PCU, inpatient  Severe sepsis (HCC) Elevated lactic acid, new hypoxia requiring O2 supplementation, immunocompromise Given patient is immunocompromised in setting of palliative chemotherapy Will get blood cultures x 2, broad-spectrum antibiotics with cefepime and vancomycin Sodium chloride 1 L bolus ordered; change sodium chloride infusion from 125 mL to 150 mL/h, 1 day ordered Discussed with nursing via secure chat Continue admission to PCU, inpatient  Addendum: CT chest without contrast: Was read by radiology showing multifocal groundglass and airspace opacity in bilateral lower lobes, findings concerning for multifocal pneumonia.  COVID-19 can have this appearance.  COVID check via PCR ordered. 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  Elevated troponin Suspect secondary to demand ischemia in setting of acute hypoxic respiratory failure Low clinical suspicion for ACS as patient troponin was 48 and on repeat is 44 Treat hypoxia as above  Breast cancer, stage 2, left (HCC) Continue outpatient follow-up with medical oncology as appropriate  Primary hypertension Home antihypertensive medication not resumed on admission Daughter-in-law has not given patient antihypertensive medications today as she noted that patient's blood pressure was on the low normal side AM team to resume home antihypertensive medication when the benefits outweigh the risk Hydralazine 5 mg IV q6h as needed for SBP > 180, 4 days ordered  Pure hypercholesterolemia Rosuvastatin 5 mg daily resumed  Anxiety Lorazepam 0.5 mg IV as needed for anxiety, can be given before MRI, 2 doses ordered      {Tip this will not be part of the note when signed Body mass index is 26.63 kg/m. , ,  Active Pressure Injury/Wound(s)     Pressure Ulcer  Duration          Pressure Injury 07/18/22 Coccyx Anterior Stage 2 -  Partial thickness loss of dermis presenting as a shallow open injury with a red, pink wound bed without slough. 137 days           (Optional):26781}  {(NOTE) Pain control PDMP Statment (Optional):26782} Consultants: *** Procedures performed: ***  Disposition: {Plan; Disposition:26390} Diet recommendation:  Discharge Diet Orders (From admission, onward)     Start     Ordered   12/03/22 0000  Diet - low sodium heart healthy        12/03/22 1449           {Diet_Plan:26776} DISCHARGE MEDICATION: Allergies as of 12/03/2022       Reactions   Sulfa Antibiotics Diarrhea        Medication List     STOP taking these medications    amLODipine 5 MG tablet Commonly known as: Norvasc   everolimus 5 MG tablet Commonly known as: AFINITOR    exemestane 25 MG tablet Commonly known as: AROMASIN   hydrochlorothiazide 12.5 MG tablet Commonly known as: HYDRODIURIL   linaclotide 145 MCG Caps capsule Commonly known as: LINZESS   metoprolol tartrate 25 MG tablet Commonly known as: LOPRESSOR       TAKE these medications    acetaminophen 500 MG tablet Commonly known as: TYLENOL Take 1,000 mg by mouth every 6 (six) hours as needed for moderate pain or headache.   Align 4 MG Caps Take 1 capsule by mouth daily.   ALPRAZolam 0.5 MG tablet Commonly known as: XANAX Take 1 tablet (0.5 mg total) by mouth 2 (two) times daily as needed for anxiety.   ascorbic acid 500 MG tablet Commonly known as: VITAMIN C Take 500 mg by mouth daily.   aspirin EC 81 MG tablet Take 81 mg by mouth at bedtime.   calcium carbonate 1250 (500 Ca) MG tablet Commonly known as: OS-CAL - dosed in mg of elemental calcium Take 1 tablet by mouth daily with breakfast.   feeding supplement Liqd Take 237 mLs by mouth 2 (two) times daily between meals.   levothyroxine 100 MCG tablet Commonly known as: SYNTHROID Take 1 tablet (100 mcg total) by mouth daily before breakfast.   loratadine 10 MG tablet Commonly known as:  CLARITIN Take 10 mg by mouth daily. Take to reduce risk of rash from everolimus.   MAGNESIUM CITRATE PO Take by mouth.   magnesium oxide 400 (240 Mg) MG tablet Commonly known as: MAG-OX Take 400 mg by mouth daily.   midodrine 10 MG tablet Commonly known as: PROAMATINE Take 1 tablet (10 mg total) by mouth 3 (three) times daily with meals.   multivitamin with minerals Tabs tablet Take 1 tablet by mouth daily.   ondansetron 8 MG tablet Commonly known as: ZOFRAN Take 1 tablet (8 mg total) by mouth every 8 (eight) hours as needed.   ondansetron 8 MG tablet Commonly known as: ZOFRAN Take 1 tablet (8 mg total) by mouth every 8 (eight) hours as needed for nausea or vomiting.   pantoprazole 20 MG tablet Commonly known as:  PROTONIX TAKE 1 TABLET(20 MG) BY MOUTH DAILY   potassium chloride SA 20 MEQ tablet Commonly known as: KLOR-CON M Take 1 tablet (20 mEq total) by mouth 2 (two) times daily.   rosuvastatin 5 MG tablet Commonly known as: CRESTOR Take 1 tablet (5 mg total) by mouth daily.   traMADol 50 MG tablet Commonly known as: ULTRAM Take 1 tablet (50 mg total) by mouth every 6 (six) hours as needed.   traZODone 50 MG tablet Commonly known as: DESYREL Take 1 tablet (50 mg total) by mouth at bedtime.   Vitamin D3 125 MCG (5000 UT) Caps Take 5,000 Units by mouth 2 (two) times a day.        Discharge Exam: Filed Weights   11/26/22 1226  Weight: 74.8 kg   ***  Condition at discharge: {DC Condition:26389}  The results of significant diagnostics from this hospitalization (including imaging, microbiology, ancillary and laboratory) are listed below for reference.   Imaging Studies: CT CHEST ABDOMEN PELVIS W CONTRAST  Result Date: 12/03/2022 CLINICAL DATA:  Left breast cancer with osseous metastases EXAM: CT CHEST, ABDOMEN, AND PELVIS WITH CONTRAST TECHNIQUE: Multidetector CT imaging of the chest, abdomen and pelvis was performed following the standard protocol during bolus administration of intravenous contrast. RADIATION DOSE REDUCTION: This exam was performed according to the departmental dose-optimization program which includes automated exposure control, adjustment of the mA and/or kV according to patient size and/or use of iterative reconstruction technique. CONTRAST:  80mL OMNIPAQUE IOHEXOL 300 MG/ML  SOLN COMPARISON:  CTA chest CT abdomen/pelvis dated 07/24/2022 FINDINGS: CT CHEST FINDINGS Cardiovascular: The heart is normal in size. No pericardial effusion. No evidence of thoracic aortic aneurysm. Atherosclerotic calcifications of the aortic arch. Mild coronary atherosclerosis of the LAD. Mediastinum/Nodes: No suspicious mediastinal lymphadenopathy. Visualized thyroid is unremarkable.  Lungs/Pleura: Radiation changes in the anterior left upper lobe. Mild centrilobular and paraseptal emphysematous changes, upper lung predominant. Subpleural patchy opacities in the inferior right middle lobe and bilateral lower lobes. Associated lower lobe bronchiectasis. This appearance favors multifocal infection/pneumonia, possibly on the basis of aspiration. Pulmonary toxicity/chronic hypersensitivity pneumonitis could have a similar appearance. No pleural effusion or pneumothorax. Musculoskeletal: Status post left mastectomy. Expansile lytic metastasis in the right acromion (series 2/image 4), mildly progressive. Stable expansile lytic metastasis in the left scapula (series 2/images 26). Multifocal lytic metastases in the visualized axial and appendicular skeleton (for example the T12 and L1 vertebral bodies (series 2/images 59 and 66), progressive. CT ABDOMEN PELVIS FINDINGS Hepatobiliary: Liver is within normal limits. No suspicious/enhancing hepatic lesions. Gallbladder is unremarkable. No intrahepatic or extrahepatic duct dilatation. Pancreas: Within normal limits. Spleen: Within normal limits. Adrenals/Urinary Tract: Adrenal glands are within normal  limits. Right kidney is within normal limits. Left renal cysts, including a dominant 5.9 cm simple left lower pole renal cyst (series 2/image 33), benign (Bosniak I). No follow-up is recommended. Bladder is underdistended but unremarkable. Stomach/Bowel: Stomach is within normal limits. No evidence of bowel obstruction. Normal appendix (series 2/image 94). Left colonic diverticulosis, without evidence of diverticulitis. Vascular/Lymphatic: No evidence of abdominal aortic aneurysm. Atherosclerotic calcifications of the abdominal aorta and branch vessels, although vessels remain patent. No suspicious abdominopelvic lymphadenopathy. Reproductive: Uterus is within normal limits. Left ovary is within normal limits.  No right adnexal mass. Other: No abdominopelvic  ascites. Musculoskeletal: Stable expansile lytic metastasis in the left iliac bone (series 2/image 101). Lytic metastasis with pathologic fracture involving the left superior pubic ramus (series 2/image 115). IMPRESSION: Status post left mastectomy. Subpleural patchy opacities in the bilateral lower lobes, favoring multifocal infection/pneumonia, possibly on the basis of aspiration. Pulmonary toxicity/chronic hypersensitivity pneumonitis could have a similar appearance. Follow-up CT chest is suggested in 6-12 weeks after appropriate interval therapy. Progressive multifocal lytic osseous metastases, as above. Aortic Atherosclerosis (ICD10-I70.0) and Emphysema (ICD10-J43.9). Electronically Signed   By: Charline Bills M.D.   On: 12/03/2022 01:41   NM Pulmonary Perfusion  Result Date: 11/27/2022 CLINICAL DATA:  Elevated D-dimer. Short of breath. Concern for pulmonary embolism. EXAM: NUCLEAR MEDICINE PERFUSION LUNG SCAN TECHNIQUE: Perfusion images were obtained in multiple projections after intravenous injection of radiopharmaceutical. RADIOPHARMACEUTICALS:  4.8 mCi Tc-86m MAA COMPARISON:  CT chest 11/26/2022. FINDINGS: No wedge-shaped peripheral perfusion defects to suggest acute pulmonary embolism. Decreased regional perfusion to the lung bases in a non segmental pattern corresponds to airspace consolidation seen on comparison CT. IMPRESSION: 1. No evidence of acute pulmonary embolism. 2. Decreased perfusion lung bases corresponds to pneumonia seen on comparison CT Electronically Signed   By: Genevive Bi M.D.   On: 11/27/2022 14:58   MR BRAIN WO CONTRAST  Result Date: 11/27/2022 CLINICAL DATA:  Initial evaluation for headache, neuro deficit. EXAM: MRI HEAD WITHOUT CONTRAST TECHNIQUE: Multiplanar, multiecho pulse sequences of the brain and surrounding structures were obtained without intravenous contrast. COMPARISON:  Prior study from 07/07/2019. FINDINGS: Brain: Mild age-related cerebral atrophy. Few  scattered punctate foci of T2/FLAIR hyperintensity involving the supratentorial cerebral white matter, nonspecific, but most commonly related to chronic microvascular ischemic disease. Changes are extremely mild for age. No evidence for acute or subacute ischemia. No acute or chronic intracranial blood products. 8 mm well-circumscribed extra-axial lesion overlying the posterior left parietal convexity (series 9, image 34). Finding favored to reflect a small meningioma, and is similar as compared to prior brain MRI from 2021. No associated edema or mass effect. No other mass lesion or midline shift. No hydrocephalus or extra-axial fluid collection. Pituitary gland within normal limits. Vascular: Major intracranial vascular flow voids are maintained. Skull and upper cervical spine: Craniocervical junction within normal limits. Heterogeneous mass measuring 5.2 x 2.2 cm at the posterior calvarium along the midline, consistent with an osseous metastasis (series 9, image 38). Additional 2.3 cm osseous metastasis at the left calvarium (series 9, image 34). Few additional scattered smaller lesions noted elsewhere about the calvarium. Sinuses/Orbits: Globes orbital soft tissues within normal limits. Paranasal sinuses are largely clear. No significant mastoid effusion. Other: None. IMPRESSION: 1. No acute intracranial abnormality. 2. Multiple osseous metastases involving the calvarium, the largest measuring up to 5.2 cm at the posterior calvarium along the midline. 3. 8 mm extra-axial lesion overlying the posterior left parietal convexity, relatively similar as compared to prior brain MRI  from 07/07/2019, and most likely reflecting a small meningioma. No associated edema or mass effect. 4. Underlying mild age-related cerebral atrophy with mild cerebral white matter disease, nonspecific, but most likely reflecting changes of chronic microvascular ischemic disease. Electronically Signed   By: Rise Mu M.D.   On:  11/27/2022 02:00   CT CHEST WO CONTRAST  Result Date: 11/26/2022 CLINICAL DATA:  Tachycardia and palpitations. EXAM: CT CHEST WITHOUT CONTRAST TECHNIQUE: Multidetector CT imaging of the chest was performed following the standard protocol without IV contrast. RADIATION DOSE REDUCTION: This exam was performed according to the departmental dose-optimization program which includes automated exposure control, adjustment of the mA and/or kV according to patient size and/or use of iterative reconstruction technique. COMPARISON:  CT chest abdomen and pelvis 11/21/2022. CT angiogram chest 07/24/2022. FINDINGS: Cardiovascular: No significant vascular findings. Normal heart size. No pericardial effusion. There are atherosclerotic calcifications of the aorta and coronary arteries. Mediastinum/Nodes: No enlarged mediastinal or axillary lymph nodes. Thyroid gland, trachea, and esophagus demonstrate no significant findings. Lungs/Pleura: Mild emphysematous changes are present. There are multifocal ground-glass and airspace opacities in the bilateral lower lobes. Right lower lobe bronchiectasis is again noted. Appearance is similar to most recent comparison. Postradiation changes noted in the anterior left upper lobe peripherally, unchanged. No pleural effusion or pneumothorax. There is a calcified granuloma in the right lower lobe. Upper Abdomen: No acute abnormality. Musculoskeletal: Left mastectomy changes are present. Bilateral scapular lesions are compatible with metastatic disease and appear grossly unchanged. No acute fractures are identified. IMPRESSION: 1. Multifocal ground-glass and airspace opacities in the bilateral lower lobes, similar to most recent comparison. Findings are concerning for multifocal pneumonia. COVID pneumonia can have this appearance. 2. Right lower lobe bronchiectasis. 3. Stable postradiation changes in the left upper lobe. 4. Stable osseous metastatic disease. Aortic Atherosclerosis  (ICD10-I70.0) and Emphysema (ICD10-J43.9). Electronically Signed   By: Darliss Cheney M.D.   On: 11/26/2022 21:57   US Venous Img Lower Bilateral  Result Date: 11/26/2022 CLINICAL DATA:  Shortness of breath. History of malignancy and pulmonary embolism. Evaluate for DVT. EXAM: BILATERAL LOWER EXTREMITY VENOUS DOPPLER ULTRASOUND TECHNIQUE: Gray-scale sonography with graded compression, as well as color Doppler and duplex ultrasound were performed to evaluate the lower extremity deep venous systems from the level of the common femoral vein and including the common femoral, femoral, profunda femoral, popliteal and calf veins including the posterior tibial, peroneal and gastrocnemius veins when visible. The superficial great saphenous vein was also interrogated. Spectral Doppler was utilized to evaluate flow at rest and with distal augmentation maneuvers in the common femoral, femoral and popliteal veins. COMPARISON:  Bilateral lower extremity venous Doppler ultrasound-12/01/2018 (negative). FINDINGS: RIGHT LOWER EXTREMITY Common Femoral Vein: No evidence of thrombus. Normal compressibility, respiratory phasicity and response to augmentation. Saphenofemoral Junction: No evidence of thrombus. Normal compressibility and flow on color Doppler imaging. Profunda Femoral Vein: No evidence of thrombus. Normal compressibility and flow on color Doppler imaging. Femoral Vein: No evidence of thrombus. Normal compressibility, respiratory phasicity and response to augmentation. Popliteal Vein: No evidence of thrombus. Normal compressibility, respiratory phasicity and response to augmentation. Calf Veins: No evidence of thrombus. Normal compressibility and flow on color Doppler imaging. Superficial Great Saphenous Vein: No evidence of thrombus. Normal compressibility. Other Findings:  None. LEFT LOWER EXTREMITY Common Femoral Vein: No evidence of thrombus. Normal compressibility, respiratory phasicity and response to  augmentation. Saphenofemoral Junction: No evidence of thrombus. Normal compressibility and flow on color Doppler imaging. Profunda Femoral Vein: No evidence of thrombus.  Normal compressibility and flow on color Doppler imaging. Femoral Vein: No evidence of thrombus. Normal compressibility, respiratory phasicity and response to augmentation. Popliteal Vein: No evidence of thrombus. Normal compressibility, respiratory phasicity and response to augmentation. Calf Veins: No evidence of thrombus. Normal compressibility and flow on color Doppler imaging. Superficial Great Saphenous Vein: No evidence of thrombus. Normal compressibility. Other Findings:  None. IMPRESSION: No evidence of DVT within either lower extremity. Electronically Signed   By: Simonne Come M.D.   On: 11/26/2022 17:16   DG Chest Portable 1 View  Result Date: 11/26/2022 CLINICAL DATA:  Shortness of breath. EXAM: PORTABLE CHEST 1 VIEW COMPARISON:  Chest x-ray 07/23/2022.  CT scan 11/21/2022. FINDINGS: Enlarged cardiopericardial silhouette. Tiny right effusion. No pneumothorax or edema. Chronic lung changes. Patchy consolidative like opacity along both lung bases, left-greater-than-right. Acute process is possible. Distribution is similar to the prior CT scan. IMPRESSION: Persistent lung base parenchymal opacity. Question right effusion. Recommend continued follow up to confirm clearance. Electronically Signed   By: Karen Kays M.D.   On: 11/26/2022 14:52    Microbiology: Results for orders placed or performed during the hospital encounter of 11/26/22  Culture, blood (Routine X 2) w Reflex to ID Panel     Status: None   Collection Time: 11/26/22 10:08 PM   Specimen: BLOOD  Result Value Ref Range Status   Specimen Description BLOOD BLOOD RIGHT ARM  Final   Special Requests   Final    BOTTLES DRAWN AEROBIC AND ANAEROBIC Blood Culture adequate volume   Culture   Final    NO GROWTH 5 DAYS Performed at [facility], [address] [place], [place] [address]    Report Status 12/01/2022 FINAL  Final  Culture, blood (Routine X 2) w Reflex to ID Panel     Status: None   Collection Time: 11/26/22 10:08 PM   Specimen: BLOOD  Result Value Ref Range Status   Specimen Description BLOOD BLOOD RIGHT HAND  Final   Special Requests   Final    BOTTLES DRAWN AEROBIC AND ANAEROBIC Blood Culture adequate volume   Culture   Final    NO GROWTH 5 DAYS Performed at [facility], [address] [place], [place] [address]    Report Status 12/01/2022 FINAL  Final  SARS Coronavirus 2 by RT PCR (hospital order, performed in [facility] hospital lab) *cepheid single result test* Anterior Nasal Swab     Status: None   Collection Time: 11/26/22 11:03 PM   Specimen: Anterior Nasal Swab  Result Value Ref Range Status   SARS Coronavirus 2 by RT PCR NEGATIVE NEGATIVE Final    Comment: (NOTE) SARS-CoV-2 target nucleic acids are NOT DETECTED.  The SARS-CoV-2 RNA is generally detectable in upper and lower respiratory specimens during the acute phase of infection. The lowest concentration of SARS-CoV-2 viral copies this assay can detect is 250 copies / mL. A negative result does not preclude SARS-CoV-2 infection and should not be used as the sole basis for treatment or other patient management decisions.  A negative result may occur with improper specimen collection / handling, submission of specimen other than nasopharyngeal swab, presence of viral mutation(s) within the areas targeted by this assay, and inadequate number of viral copies (<250 copies / mL). A negative result must be combined with clinical observations, patient history, and epidemiological information.  Fact Sheet for Patients:   RoadLapTop.co.za  Fact Sheet for Healthcare Providers: http://kim-miller.com/  This test is not yet approved or  cleared by the [place] and has been authorized for detection and/or  diagnosis of SARS-CoV-2 by FDA under an Emergency Use Authorization (EUA).  This EUA will remain in effect (meaning this test can be used) for the duration of the COVID-19 declaration under Section 564(b)(1) of the Act, 21 U.S.C. section 360bbb-3(b)(1), unless the authorization is terminated or revoked sooner.  Performed at [facility], [address] [place], [place] [address]   MRSA Next Gen by PCR, Nasal     Status: None   Collection Time: 11/27/22 10:28 AM   Specimen: Nasal Mucosa; Nasal Swab  Result Value Ref Range Status   MRSA by PCR Next Gen NOT DETECTED NOT DETECTED Final    Comment: (NOTE) The GeneXpert MRSA Assay (FDA approved for NASAL specimens only), is one component of a comprehensive MRSA colonization surveillance program. It is not intended to diagnose MRSA infection nor to guide or monitor treatment for MRSA infections. Test performance is not FDA approved in patients less than 43 years old. Performed at [facility] [facility], [address] [place], [place] [address]   Expectorated Sputum Assessment w Gram Stain, Rflx to Resp Cult     Status: None   Collection Time: 11/28/22 10:00 PM   Specimen: Sputum  Result Value Ref Range Status   Specimen Description SPUTUM  Final   Special Requests NONE  Final   Sputum evaluation   Final    Sputum specimen not acceptable for testing.  Please recollect.   [email or web address]  11/29/22 RH Performed at [facility], [address] [place], [place] [address]    Report Status 11/29/2022 FINAL  Final    Labs: CBC: Recent Labs  Lab 11/27/22 0430 11/28/22 0511 11/29/22 0557  WBC 7.5 7.8 6.4  HGB 9.1* 9.1* 9.7*  HCT 28.7* 28.3* 29.7*  MCV 82.0 82.0 80.7  PLT 303 288 282   Basic Metabolic Panel: Recent Labs  Lab 11/28/22 0511 11/29/22 0557 11/30/22 0542 12/01/22 0527 12/02/22 0345 12/03/22 0530  NA 138 137 139 140 139 141  K 2.5* 3.4* 3.5 3.4* 3.6 3.3*  CL 100 102 105 105 106 107   CO2 24 24 22 23 25 22   GLUCOSE 96 104* 96 94 107* 94  BUN 24* 22 25* 25* 25* 19  CREATININE 2.12* 2.15* 1.96* 1.70* 1.54* 1.14*  CALCIUM 10.8* 11.6* 10.9* 10.9* 10.1 9.5  MG 1.6* 2.0  --  1.6* 1.7 1.7  PHOS  --  2.5 2.1* 2.2*  --  2.5   Liver Function Tests: Recent Labs  Lab 11/29/22 0557 11/30/22 0542  ALBUMIN 3.2* 3.0*   CBG: No results for input(s): "GLUCAP" in the last 168 hours.  Discharge time spent: {LESS THAN/GREATER KGMW:10272} 30 minutes.  Signed: Pennie Banter, DO [facility] 12/03/2022

## 2022-12-03 NOTE — Care Management Important Message (Signed)
Important Message  Patient Details  Name: BERTA MINARIK MRN: [id number] Date of Birth: [birth year]   Important Message Given:  Yes - Medicare IM     Olegario Messier A Alexandra Lipps 12/03/2022, 2:27 PM

## 2022-12-03 NOTE — Plan of Care (Signed)
  Problem: Education: Goal: Knowledge of General Education information will improve Description: Including pain rating scale, medication(s)/side effects and non-pharmacologic comfort measures Outcome: Progressing   Problem: Coping: Goal: Level of anxiety will decrease Outcome: Progressing   Problem: Safety: Goal: Ability to remain free from injury will improve Outcome: Progressing   

## 2022-12-03 NOTE — TOC Progression Note (Addendum)
Transition of Care [facility]) - Progression Note    Patient Details  Name: Alicia [name] MRN: [id number] Date of Birth: 05/13/1950  Transition of Care [facility]) CM/SW Contact  Darolyn Rua, [place] Phone Number: 12/03/2022, 9:46 AM  Clinical Narrative:     11/18: Patient to resume [facility] HH at time of discharge, toc will continue to follow for needs - Adelina Mings with [facility] informed of dc today hh orders are in   11/16: Csw met with patient at bedside, along with [facility] daughter in Social worker. Patient reports living with her and son, confirms PCP is Dr. Juel Burrow and pharmacy is [facility] on [address].    She reports hx of HH services through [facility] just ended in October for PT and RN, reports having a walker, cane, rollator and 3in1 at home.    Currently on O2 in hospital, does not wear O2 at baseline.    TOC will continue to follow for discharge planning needs.   Expected Discharge Plan: Home w Home Health Services Barriers to Discharge: Continued Medical Work up  Expected Discharge Plan and Services       Living arrangements for the past 2 months: Single Family Home                                       Social Determinants of Health (SDOH) Interventions SDOH Screenings   Food Insecurity: No Food Insecurity (07/18/2022)  Housing: Patient Declined (07/18/2022)  Transportation Needs: No Transportation Needs (07/18/2022)  Utilities: Not At Risk (07/18/2022)  Alcohol Screen: Low Risk  (03/09/2022)  Depression (PHQ2-9): Low Risk  (03/09/2022)  Financial Resource Strain: High Risk (03/09/2022)  Physical Activity: Insufficiently Active (03/09/2022)  Social Connections: Socially Isolated (03/09/2022)  Stress: No Stress Concern Present (03/09/2022)  Tobacco Use: Medium Risk (11/26/2022)    Readmission Risk Interventions     No data to display

## 2022-12-03 NOTE — Progress Notes (Signed)
Occupational Therapy Treatment Patient Details Name: Alicia [name] MRN: [id number] DOB: 07/10/1950 Today's Date: 12/03/2022   History of present illness 72 year old female with PMHx of left breast carcinoma with bony metastasis, s/p chemotherapy and L mastectomy prior radiation therapy, hypertension, depression, anxiety, hypothyroid, who presents to the ED for chief concerns of acute hypoxic respiratory failure, hypercalcemia. No PE.   OT comments  Pt is supine in bed on arrival. Pleasant and agreeable to OT session. She denies pain. Orthostatic vitals measured during session: Orthostatic VS R arm:  BP reading  Supine 129/68  Sitting EOB 130/64  Standing 0 min 104/59  Standing 3 mins 119/60  Sitting 129/62    Pt performed bed mobility with MOD I. STS from EOB with SUP to RW x2 trials. Pt able to stand at sink and perform oral care with SBA and no LOB noted. Pt reports going to the bathroom and doing a light wash up this morning. Pt remains with mild weakness, but DC plan remains appropriate. Deferred sitting up in recliner d/t getting cold by the window, therefore returned to supine in bed with all needs in place and will cont to require skilled acute OT services to maximize his safety and IND to return to PLOF.       If plan is discharge home, recommend the following:  A little help with walking and/or transfers;A little help with bathing/dressing/bathroom;Assistance with cooking/housework;Help with stairs or ramp for entrance   Equipment Recommendations  None recommended by OT    Recommendations for Other Services      Precautions / Restrictions Precautions Precautions: Fall Precaution Comments: watch BP Restrictions Weight Bearing Restrictions: No       Mobility Bed Mobility Overal bed mobility: Modified Independent Bed Mobility: Supine to Sit, Sit to Supine     Supine to sit: Modified independent (Device/Increase time), HOB elevated, Used rails Sit to supine:  Modified independent (Device/Increase time), HOB elevated, Used rails        Transfers Overall transfer level: Needs assistance Equipment used: Rolling walker (2 wheels) Transfers: Sit to/from Stand Sit to Stand: Supervision           General transfer comment: SUP for STS from EOB to sink/RW x2 during session     Balance Overall balance assessment: Needs assistance Sitting-balance support: No upper extremity supported, Feet supported Sitting balance-Leahy Scale: Good     Standing balance support: During functional activity, Single extremity supported Standing balance-Leahy Scale: Fair Standing balance comment: no LOB standing at sink with SBA from therapist to perform oral care                           ADL either performed or assessed with clinical judgement   ADL Overall ADL's : Needs assistance/impaired     Grooming: Oral care;Standing;Supervision/safety Grooming Details (indicate cue type and reason): SBA to perform oral care standing at sink                                    Extremity/Trunk Assessment Upper Extremity Assessment Upper Extremity Assessment: Generalized weakness   Lower Extremity Assessment Lower Extremity Assessment: Generalized weakness        Vision       Perception     Praxis      Cognition Arousal: Alert Behavior During Therapy: WFL for tasks assessed/performed Overall Cognitive Status: Within Functional Limits for tasks  assessed                                          Exercises      Shoulder Instructions       General Comments      Pertinent Vitals/ Pain       Pain Assessment Pain Assessment: No/denies pain  Home Living                                          Prior Functioning/Environment              Frequency  Min 1X/week        Progress Toward Goals  OT Goals(current goals can now be found in the care plan section)  Progress towards  OT goals: Progressing toward goals  Acute Rehab OT Goals Patient Stated Goal: return home OT Goal Formulation: With patient Time For Goal Achievement: 12/13/22 Potential to Achieve Goals: Good  Plan      Co-evaluation                 AM-PAC OT "6 Clicks" Daily Activity     Outcome Measure   Help from another person eating meals?: None Help from another person taking care of personal grooming?: None Help from another person toileting, which includes using toliet, bedpan, or urinal?: A Little Help from another person bathing (including washing, rinsing, drying)?: A Little Help from another person to put on and taking off regular upper body clothing?: None Help from another person to put on and taking off regular lower body clothing?: A Little 6 Click Score: 21    End of Session Equipment Utilized During Treatment: Rolling walker (2 wheels)  OT Visit Diagnosis: Other abnormalities of gait and mobility (R26.89)   Activity Tolerance Patient tolerated treatment well   Patient Left in bed;with bed alarm set;with call bell/phone within reach   Nurse Communication Mobility status        Time: 1610-9604 OT Time Calculation (min): 28 min  Charges: OT General Charges $OT Visit: 1 Visit OT Treatments $Self Care/Home Management : 8-22 mins $Therapeutic Activity: 8-22 mins  Brynden Thune, OTR/L  12/03/22, 12:06 PM   Rakeisha Nyce E Sigifredo Pignato 12/03/2022, 12:02 PM

## 2022-12-04 ENCOUNTER — Other Ambulatory Visit: Payer: Self-pay

## 2022-12-04 ENCOUNTER — Ambulatory Visit: Payer: No Typology Code available for payment source

## 2022-12-04 ENCOUNTER — Encounter: Payer: Self-pay | Admitting: Internal Medicine

## 2022-12-05 ENCOUNTER — Other Ambulatory Visit: Payer: Self-pay

## 2022-12-05 NOTE — Progress Notes (Incomplete)
Oral Chemotherapy Clinic [facility]  [phone] [phone]  [name], [name], [name], [name] Jim Like, RN as Registered Nurse Orlie Dakin Tollie Pizza, MD as Consulting Physician (Oncology) Carmina Miller, MD as Referring Physician (Radiation Oncology) Chriss Driver, RN as Registered Nurse   Name of the patient: Alicia [name]  [id number]  1950-11-06   Date of visit: 12/06/22  HPI: Patient is a 72 y.o. female with metastatic ER+ HER2- breast cancer. Previously treated with palbociclib, but treatment was changed to Afinitor (everolimus) and exemestane due to disease progression 03/2022. Everolimus 10 mg held on 05/22/22 due to potential drug rash, patient started on steroid taper. Rash cleared and patient resumed everolimus 10 mg on 05/31/22. Patient called to report rash return on 06/04/22. Treatment was again held and prescription for hydrocortisone 2.5% cream sent in. Patient resumed everolimus 7.5mg  on 06/18/22 along with loratidine. Patient was admitted to [facility] hospital for altered mental status, dehydration, and weakness on 07/18/22, everolimus and exemestane were held with admission and at discharge. Patient resumed everolimus (at 5mg )  and exemestane on 09/20/22. Everolimus was again held on 10/05/22 due to weakness and fatigue, everolimus 5mg  was resumed on 10/08/22. Everolimus held again on 11/10/22. Patient was in the hospital 11/26/22-12/03/22 for pneumonia.   Reason for Consult: Plan of care follow-up   PAST MEDICAL HISTORY: Past Medical History:  Diagnosis Date   Acute hypoxic respiratory failure (HCC) 11/26/2022   AKI (acute kidney injury) (HCC) 07/18/2022   Anxiety    Breast cancer, left (HCC) 07/2017   Depression    Electrolyte abnormality 07/18/2022   Elevated troponin 02/17/2015   Hypercalcemia 11/26/2022   Hypertension     Hypothyroidism    Personal history of chemotherapy 2019   LEFT mastectomy-chemo before   Personal history of radiation therapy 03/2018   LEFT mastectomy   Rapid heart rate    Thyroid disease     HEMATOLOGY/ONCOLOGY HISTORY:  Oncology History Overview Note  Patient is a 72 year old female who recently self palpated a mass on her left breast.  Subsequent imaging and biopsy revealed the above-stated breast cancer.  Case was also discussed extensively at case conference.  Given the size and the stage of patient's malignancy, she will benefit from neoadjuvant chemotherapy using Adriamycin, Cytoxan, and Taxol.  Patient will also require Neulasta support.  Will get CT scan of the chest, abdomen, and pelvis to assess for any metastatic disease.  Patient will also require port placement and MUGA prior to initiating treatment  CT abdomen/pelvis/chest did not reveal any suspicious lesions concerning for metastatic disease. (08/01/17)  Port-A-Cath placed on 08/07/2017.  Cycle 1 day 1 of AC was given on 08/08/17.     Breast cancer, stage 2, left (HCC)  07/24/2017 Initial Diagnosis   Breast cancer, stage 2, left (HCC)   07/30/2017 Cancer Staging   Staging form: Breast, AJCC 8th Edition - Clinical stage from 07/30/2017: Stage IIA (cT2, cN1, cM0, G2, ER+, PR+, HER2-) - Signed by Jeralyn Ruths, MD on 07/30/2017   08/08/2017 - 10/31/2017 Chemotherapy   The patient had DOXOrubicin (ADRIAMYCIN) chemo injection 130 mg, 60 mg/m2 = 130 mg, Intravenous,  Once, 4 of 4 cycles Administration: 130 mg (08/08/2017), 130 mg (08/22/2017), 130 mg (09/05/2017), 130 mg (09/19/2017) palonosetron (ALOXI) injection 0.25 mg, 0.25 mg, Intravenous,  Once, 4 of 4 cycles Administration: 0.25 mg (08/08/2017), 0.25 mg (08/22/2017), 0.25 mg (09/05/2017), 0.25 mg (  09/19/2017) pegfilgrastim-cbqv (UDENYCA) injection 6 mg, 6 mg, Subcutaneous, Once, 4 of 4 cycles Administration: 6 mg (08/09/2017), 6 mg (08/23/2017), 6 mg (09/06/2017), 6 mg  (09/20/2017) cyclophosphamide (CYTOXAN) 1,300 mg in sodium chloride 0.9 % 250 mL chemo infusion, 600 mg/m2 = 1,300 mg, Intravenous,  Once, 4 of 4 cycles Administration: 1,300 mg (08/08/2017), 1,300 mg (08/22/2017), 1,300 mg (09/05/2017), 1,300 mg (09/19/2017) PACLitaxel (TAXOL) 174 mg in sodium chloride 0.9 % 250 mL chemo infusion (</= 80mg /m2), 80 mg/m2 = 174 mg, Intravenous,  Once, 4 of 12 cycles Dose modification: 72 mg/m2 (original dose 80 mg/m2, Cycle 6, Reason: Dose not tolerated) Administration: 174 mg (10/03/2017), 156 mg (10/17/2017), 156 mg (10/24/2017), 156 mg (10/31/2017) fosaprepitant (EMEND) 150 mg, dexamethasone (DECADRON) 12 mg in sodium chloride 0.9 % 145 mL IVPB, , Intravenous,  Once, 4 of 4 cycles Administration:  (08/08/2017),  (08/22/2017),  (09/05/2017),  (09/19/2017)  for chemotherapy treatment.      ALLERGIES:  is allergic to sulfa antibiotics.  MEDICATIONS:  Current Outpatient Medications  Medication Sig Dispense Refill   acetaminophen (TYLENOL) 500 MG tablet Take 1,000 mg by mouth every 6 (six) hours as needed for moderate pain or headache.      ALPRAZolam (XANAX) 0.5 MG tablet Take 1 tablet (0.5 mg total) by mouth 2 (two) times daily as needed for anxiety. 60 tablet 0   ascorbic acid (VITAMIN C) 500 MG tablet Take 500 mg by mouth daily.     aspirin EC 81 MG tablet Take 81 mg by mouth at bedtime.      calcium carbonate (OS-CAL - DOSED IN MG OF ELEMENTAL CALCIUM) 1250 (500 Ca) MG tablet Take 1 tablet by mouth daily with breakfast. (Patient not taking: Reported on 12/06/2022)     Cholecalciferol (VITAMIN D3) 125 MCG (5000 UT) CAPS Take 5,000 Units by mouth 2 (two) times a day. (Patient not taking: Reported on 12/06/2022)     feeding supplement (ENSURE ENLIVE / ENSURE PLUS) LIQD Take 237 mLs by mouth 2 (two) times daily between meals.     levothyroxine (SYNTHROID) 100 MCG tablet Take 1 tablet (100 mcg total) by mouth daily before breakfast.     loratadine (CLARITIN) 10 MG tablet Take  10 mg by mouth daily. Take to reduce risk of rash from everolimus.     MAGNESIUM CITRATE PO Take by mouth.     magnesium oxide (MAG-OX) 400 (240 Mg) MG tablet Take 400 mg by mouth daily.     midodrine (PROAMATINE) 10 MG tablet Take 1 tablet (10 mg total) by mouth 3 (three) times daily with meals. 90 tablet 1   Multiple Vitamin (MULTIVITAMIN WITH MINERALS) TABS tablet Take 1 tablet by mouth daily.     ondansetron (ZOFRAN) 8 MG tablet Take 1 tablet (8 mg total) by mouth every 8 (eight) hours as needed. 30 tablet 1   ondansetron (ZOFRAN) 8 MG tablet Take 1 tablet (8 mg total) by mouth every 8 (eight) hours as needed for nausea or vomiting. 20 tablet 2   pantoprazole (PROTONIX) 20 MG tablet TAKE 1 TABLET(20 MG) BY MOUTH DAILY 90 tablet 0   potassium chloride SA (KLOR-CON M) 20 MEQ tablet Take 1 tablet (20 mEq total) by mouth 2 (two) times daily. 180 tablet 3   Probiotic Product (ALIGN) 4 MG CAPS Take 1 capsule by mouth daily.     rosuvastatin (CRESTOR) 5 MG tablet Take 1 tablet (5 mg total) by mouth daily. 90 tablet 3   traMADol (ULTRAM) 50 MG tablet Take  1 tablet (50 mg total) by mouth every 6 (six) hours as needed. 60 tablet 0   traZODone (DESYREL) 50 MG tablet Take 1 tablet (50 mg total) by mouth at bedtime. 30 tablet 2   No current facility-administered medications for this visit.    VITAL SIGNS: There were no vitals taken for this visit. There were no vitals filed for this visit.  Estimated body mass index is 27.92 kg/m as calculated from the following:   Height as of an earlier encounter on 12/06/22: 5\' 6"  (1.676 m).   Weight as of an earlier encounter on 12/06/22: 78.5 kg (173 lb).  LABS: CBC:    Component Value Date/Time   WBC 6.0 12/06/2022 0831   HGB 8.9 (L) 12/06/2022 0831   HGB 10.5 (L) 10/08/2022 0902   HGB 14.2 02/11/2011 1349   HCT 28.4 (L) 12/06/2022 0831   HCT 40.9 02/11/2011 1349   PLT 241 12/06/2022 0831   PLT 249 10/08/2022 0902   PLT 151 02/11/2011 1349   MCV  84.5 12/06/2022 0831   MCV 89 02/11/2011 1349   NEUTROABS 4.6 12/06/2022 0831   LYMPHSABS 0.4 (L) 12/06/2022 0831   MONOABS 0.6 12/06/2022 0831   EOSABS 0.2 12/06/2022 0831   BASOSABS 0.1 12/06/2022 0831   Comprehensive Metabolic Panel:    Component Value Date/Time   NA 142 12/06/2022 0831   NA 142 02/11/2011 1349   K 4.1 12/06/2022 0831   K 3.8 02/11/2011 1349   CL 106 12/06/2022 0831   CL 107 02/11/2011 1349   CO2 23 12/06/2022 0831   CO2 24 02/11/2011 1349   BUN 17 12/06/2022 0831   BUN 14 02/11/2011 1349   CREATININE 1.37 (H) 12/06/2022 0831   CREATININE 0.65 02/11/2011 1349   GLUCOSE 111 (H) 12/06/2022 0831   GLUCOSE 98 02/11/2011 1349   CALCIUM 9.8 12/06/2022 0831   CALCIUM 9.0 02/11/2011 1349   AST 30 12/06/2022 0831   ALT 22 12/06/2022 0831   ALT 36 02/11/2011 1349   ALKPHOS 125 12/06/2022 0831   ALKPHOS 122 02/11/2011 1349   BILITOT 0.7 12/06/2022 0831   PROT 6.8 12/06/2022 0831   PROT 7.0 02/11/2011 1349   ALBUMIN 3.5 12/06/2022 0831   ALBUMIN 4.1 02/11/2011 1349     Present during today's visit: Patient and daughter in law  Assessment and Plan: CBC/CMP reviewed and overall is feeling better and recovered from her recent hospitalizing for pneumonia.  Patient would like to continue to hold treatment for now, she will RTC in 6 weeks for discussion   Oral Chemotherapy Side Effect/Intolerance:  Patient not actively taking everolimus (see above)  Oral Chemotherapy Adherence:  Patient not actively taking everolimus (see above)  New medications: none reported  Medication Access Issues: N/A  Patient expressed understanding and was in agreement with this plan. She also understands that She can call clinic at any time with any questions, concerns, or complaints.   Follow-up plan: RTC in 6 weeks  Thank you for allowing me to participate in the care of this very pleasant patient.   Time Total: 15 mins  Visit consisted of counseling and education on  dealing with issues of symptom management in the setting of serious and potentially life-threatening illness.Greater than 50%  of this time was spent counseling and coordinating care related to the above assessment and plan.  Signed by: Remi Haggard, PharmD, BCPS, BCOP, CPP Hematology/Oncology Clinical Pharmacist Practitioner [place]/DB/AP [facility] [phone]

## 2022-12-06 ENCOUNTER — Inpatient Hospital Stay: Payer: Medicare Other

## 2022-12-06 ENCOUNTER — Inpatient Hospital Stay: Payer: Medicare Other | Attending: Oncology

## 2022-12-06 ENCOUNTER — Inpatient Hospital Stay: Payer: Medicare Other | Admitting: Oncology

## 2022-12-06 ENCOUNTER — Other Ambulatory Visit: Payer: Self-pay | Admitting: Oncology

## 2022-12-06 ENCOUNTER — Inpatient Hospital Stay (HOSPITAL_BASED_OUTPATIENT_CLINIC_OR_DEPARTMENT_OTHER): Payer: Medicare Other | Admitting: Hospice and Palliative Medicine

## 2022-12-06 ENCOUNTER — Ambulatory Visit: Payer: No Typology Code available for payment source

## 2022-12-06 ENCOUNTER — Encounter: Payer: Self-pay | Admitting: Oncology

## 2022-12-06 ENCOUNTER — Inpatient Hospital Stay: Payer: Medicare Other | Admitting: Pharmacist

## 2022-12-06 DIAGNOSIS — C50912 Malignant neoplasm of unspecified site of left female breast: Secondary | ICD-10-CM | POA: Insufficient documentation

## 2022-12-06 DIAGNOSIS — I890 Lymphedema, not elsewhere classified: Secondary | ICD-10-CM | POA: Diagnosis not present

## 2022-12-06 DIAGNOSIS — Z79899 Other long term (current) drug therapy: Secondary | ICD-10-CM | POA: Diagnosis not present

## 2022-12-06 DIAGNOSIS — F419 Anxiety disorder, unspecified: Secondary | ICD-10-CM | POA: Insufficient documentation

## 2022-12-06 DIAGNOSIS — G629 Polyneuropathy, unspecified: Secondary | ICD-10-CM | POA: Insufficient documentation

## 2022-12-06 DIAGNOSIS — Z86711 Personal history of pulmonary embolism: Secondary | ICD-10-CM | POA: Insufficient documentation

## 2022-12-06 DIAGNOSIS — D649 Anemia, unspecified: Secondary | ICD-10-CM | POA: Insufficient documentation

## 2022-12-06 DIAGNOSIS — C7951 Secondary malignant neoplasm of bone: Secondary | ICD-10-CM | POA: Insufficient documentation

## 2022-12-06 DIAGNOSIS — M8580 Other specified disorders of bone density and structure, unspecified site: Secondary | ICD-10-CM | POA: Insufficient documentation

## 2022-12-06 DIAGNOSIS — Z515 Encounter for palliative care: Secondary | ICD-10-CM

## 2022-12-06 DIAGNOSIS — Z170 Estrogen receptor positive status [ER+]: Secondary | ICD-10-CM | POA: Insufficient documentation

## 2022-12-06 DIAGNOSIS — N289 Disorder of kidney and ureter, unspecified: Secondary | ICD-10-CM | POA: Insufficient documentation

## 2022-12-06 LAB — CBC WITH DIFFERENTIAL/PLATELET
Abs Immature Granulocytes: 0.14 10*3/uL — ABNORMAL HIGH (ref 0.00–0.07)
Basophils Absolute: 0.1 10*3/uL (ref 0.0–0.1)
Basophils Relative: 1 %
Eosinophils Absolute: 0.2 10*3/uL (ref 0.0–0.5)
Eosinophils Relative: 4 %
HCT: 28.4 % — ABNORMAL LOW (ref 36.0–46.0)
Hemoglobin: 8.9 g/dL — ABNORMAL LOW (ref 12.0–15.0)
Immature Granulocytes: 2 %
Lymphocytes Relative: 7 %
Lymphs Abs: 0.4 10*3/uL — ABNORMAL LOW (ref 0.7–4.0)
MCH: 26.5 pg (ref 26.0–34.0)
MCHC: 31.3 g/dL (ref 30.0–36.0)
MCV: 84.5 fL (ref 80.0–100.0)
Monocytes Absolute: 0.6 10*3/uL (ref 0.1–1.0)
Monocytes Relative: 10 %
Neutro Abs: 4.6 10*3/uL (ref 1.7–7.7)
Neutrophils Relative %: 76 %
Platelets: 241 10*3/uL (ref 150–400)
RBC: 3.36 MIL/uL — ABNORMAL LOW (ref 3.87–5.11)
RDW: 22.1 % — ABNORMAL HIGH (ref 11.5–15.5)
WBC: 6 10*3/uL (ref 4.0–10.5)
nRBC: 0 % (ref 0.0–0.2)

## 2022-12-06 LAB — CMP (CANCER CENTER ONLY)
ALT: 22 U/L (ref 0–44)
AST: 30 U/L (ref 15–41)
Albumin: 3.5 g/dL (ref 3.5–5.0)
Alkaline Phosphatase: 125 U/L (ref 38–126)
Anion gap: 13 (ref 5–15)
BUN: 17 mg/dL (ref 8–23)
CO2: 23 mmol/L (ref 22–32)
Calcium: 9.8 mg/dL (ref 8.9–10.3)
Chloride: 106 mmol/L (ref 98–111)
Creatinine: 1.37 mg/dL — ABNORMAL HIGH (ref 0.44–1.00)
GFR, Estimated: 41 mL/min — ABNORMAL LOW (ref >60)
Glucose, Bld: 111 mg/dL — ABNORMAL HIGH (ref 70–99)
Potassium: 4.1 mmol/L (ref 3.5–5.1)
Sodium: 142 mmol/L (ref 135–145)
Total Bilirubin: 0.7 mg/dL (ref <1.2)
Total Protein: 6.8 g/dL (ref 6.5–8.1)

## 2022-12-06 MED ORDER — ALPRAZOLAM 0.5 MG PO TABS: 0.5000 mg | ORAL_TABLET | Freq: Two times a day (BID) | ORAL | 0 refills | Status: DC | PRN

## 2022-12-06 NOTE — Progress Notes (Signed)
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  INTERVAL HISTORY: Patient returns to clinic today for further evaluation and discussion on whether or not to reinitiate treatment with Afinitor.  Her performance status is slowly improving from her recent hospitalization but she still has weakness and fatigue.  Her appetite has improved.  She continues to have a mild peripheral neuropathy, but no other neurologic complaints.  She denies any chest pain, shortness of breath, cough, or hemoptysis.  She denies any nausea, vomiting, constipation, or diarrhea.  She has no urinary complaints.  Patient offers no further specific complaints today.  REVIEW OF SYSTEMS:   Review of Systems  Constitutional:  Positive for malaise/fatigue. Negative for fever and weight loss.  HENT:  Negative for congestion.   Respiratory: Negative.  Negative for cough and shortness of breath.   Cardiovascular: Negative.  Negative for chest pain and leg swelling.  Gastrointestinal: Negative.  Negative for abdominal pain, constipation, diarrhea and nausea.  Genitourinary: Negative.  Negative for dysuria, flank pain and urgency.  Musculoskeletal: Negative.  Negative for back pain and joint pain.  Skin: Negative.  Negative for rash.  Neurological:  Positive for tingling, sensory change and weakness. Negative for dizziness, focal weakness and headaches.  Psychiatric/Behavioral:  The patient  is not nervous/anxious.     As per HPI. Otherwise, a complete review of systems is negative.  PAST MEDICAL HISTORY: Past Medical History:  Diagnosis Date   Acute hypoxic respiratory failure (HCC) 11/26/2022   AKI (acute kidney injury) (HCC) 07/18/2022   Anxiety    Breast cancer, left (HCC) 07/2017   Depression    Electrolyte abnormality 07/18/2022   Elevated troponin 02/17/2015   Hypercalcemia 11/26/2022   Hypertension    Hypothyroidism    Personal history of chemotherapy 2019   LEFT mastectomy-chemo before   Personal history of radiation therapy 03/2018   LEFT mastectomy   Rapid heart rate    Thyroid disease     PAST SURGICAL HISTORY: Past Surgical History:  Procedure Laterality Date   AXILLARY LYMPH NODE BIOPSY Left 07/16/2017   METASTATIC MAMMARY CARCINOMA   BREAST BIOPSY Left 07/16/2017   [place] bx of left breast mass and left breast LN.  INVASIVE MAMMARY CARCINOMA, NO SPECIAL TYPE.    BREAST EXCISIONAL BIOPSY Right 2001   benign   BREAST LUMPECTOMY WITH SENTINEL LYMPH NODE BIOPSY Left 12/06/2017   Procedure: BREAST LUMPECTOMY WITH SENTINEL LYMPH NODE BX;  Surgeon: Earline Mayotte, MD;  Location: [facility] ORS;  Service: General;  Laterality: Left;   COLONOSCOPY     MASTECTOMY Left 12/23/2017   PORTACATH PLACEMENT Right 08/07/2017   Procedure: INSERTION PORT-A-CATH;  Surgeon: Earline Mayotte, MD;  Location: [facility] ORS;  Service: General;  Laterality: Right;   SIMPLE MASTECTOMY WITH AXILLARY SENTINEL NODE BIOPSY Left 12/23/2017   T2,N2 with 6/7 nodes positive. Whole breast radiation.  Surgeon: Earline Mayotte, MD;  Location: [facility] ORS;  Service: General;  Laterality: Left;   TONSILLECTOMY      FAMILY HISTORY: Family History  Problem Relation  Age of Onset   Stroke Mother    Thyroid disease Mother    Renal Disease Mother    Stroke Father    Heart attack Father    Sudden death Father 63       suicide   Anuerysm Brother    Breast cancer Neg Hx     ADVANCED  DIRECTIVES (Y/N):  N  HEALTH MAINTENANCE: Social History   Tobacco Use   Smoking status: Former    Current packs/day: 0.00    Average packs/day: 1 pack/day for 30.0 years (30.0 ttl pk-yrs)    Types: Cigarettes    Start date: 26    Quit date: 2019    Years since quitting: 5.8   Smokeless tobacco: Never  Vaping Use   Vaping status: Never Used  Substance Use Topics   Alcohol use: Not Currently   Drug use: Never     Colonoscopy:  PAP:  Bone density:  Lipid panel:  Allergies  Allergen Reactions   Sulfa Antibiotics Diarrhea    Current Outpatient Medications  Medication Sig Dispense Refill   acetaminophen (TYLENOL) 500 MG tablet Take 1,000 mg by mouth every 6 (six) hours as needed for moderate pain or headache.      ascorbic acid (VITAMIN C) 500 MG tablet Take 500 mg by mouth daily.     aspirin EC 81 MG tablet Take 81 mg by mouth at bedtime.      feeding supplement (ENSURE ENLIVE / ENSURE PLUS) LIQD Take 237 mLs by mouth 2 (two) times daily between meals.     levothyroxine (SYNTHROID) 100 MCG tablet Take 1 tablet (100 mcg total) by mouth daily before breakfast.     loratadine (CLARITIN) 10 MG tablet Take 10 mg by mouth daily. Take to reduce risk of rash from everolimus.     MAGNESIUM CITRATE PO Take by mouth.     magnesium oxide (MAG-OX) 400 (240 Mg) MG tablet Take 400 mg by mouth daily.     midodrine (PROAMATINE) 10 MG tablet Take 1 tablet (10 mg total) by mouth 3 (three) times daily with meals. 90 tablet 1   Multiple Vitamin (MULTIVITAMIN WITH MINERALS) TABS tablet Take 1 tablet by mouth daily.     ondansetron (ZOFRAN) 8 MG tablet Take 1 tablet (8 mg total) by mouth every 8 (eight) hours as needed. 30 tablet 1   ondansetron (ZOFRAN) 8 MG tablet Take 1 tablet (8 mg total) by mouth every 8 (eight) hours as needed for nausea or vomiting. 20 tablet 2   potassium chloride SA (KLOR-CON M) 20 MEQ tablet Take 1 tablet (20 mEq total) by mouth 2 (two) times daily. 180 tablet 3    Probiotic Product (ALIGN) 4 MG CAPS Take 1 capsule by mouth daily.     rosuvastatin (CRESTOR) 5 MG tablet Take 1 tablet (5 mg total) by mouth daily. 90 tablet 3   traMADol (ULTRAM) 50 MG tablet Take 1 tablet (50 mg total) by mouth every 6 (six) hours as needed. 60 tablet 0   traZODone (DESYREL) 50 MG tablet Take 1 tablet (50 mg total) by mouth at bedtime. 30 tablet 2   ALPRAZolam (XANAX) 0.5 MG tablet Take 1 tablet (0.5 mg total) by mouth 2 (two) times daily as needed for anxiety. 60 tablet 0   calcium carbonate (OS-CAL - DOSED IN MG OF ELEMENTAL CALCIUM) 1250 (500 Ca) MG tablet Take 1 tablet by mouth daily with breakfast. (Patient not taking: Reported on 12/06/2022)     Cholecalciferol (VITAMIN  D3) 125 MCG (5000 UT) CAPS Take 5,000 Units by mouth 2 (two) times a day. (Patient not taking: Reported on 12/06/2022)     pantoprazole (PROTONIX) 20 MG tablet TAKE 1 TABLET(20 MG) BY MOUTH DAILY 90 tablet 0   No current facility-administered medications for this visit.    OBJECTIVE: Vitals:   12/06/22 0844  BP: 120/64  Pulse: 91  Resp: 16  Temp: (!) 96.6 F (35.9 C)  SpO2: 97%      Body mass index is 27.92 kg/m.    ECOG FS:1 - Symptomatic but completely ambulatory  General: Well-developed, well-nourished, no acute distress.  Sitting in a wheelchair. Eyes: Pink conjunctiva, anicteric sclera. HEENT: Normocephalic, moist mucous membranes. Lungs: No audible wheezing or coughing. Heart: Regular rate and rhythm. Abdomen: Soft, nontender, no obvious distention. Musculoskeletal: No edema, cyanosis, or clubbing. Neuro: Alert, answering all questions appropriately. Cranial nerves grossly intact. Skin: No rashes or petechiae noted. Psych: Normal affect.   LAB RESULTS:  Lab Results  Component Value Date   NA 142 12/06/2022   K 4.1 12/06/2022   CL 106 12/06/2022   CO2 23 12/06/2022   GLUCOSE 111 (H) 12/06/2022   BUN 17 12/06/2022   CREATININE 1.37 (H) 12/06/2022   CALCIUM 9.8 12/06/2022    PROT 6.8 12/06/2022   ALBUMIN 3.5 12/06/2022   AST 30 12/06/2022   ALT 22 12/06/2022   ALKPHOS 125 12/06/2022   BILITOT 0.7 12/06/2022   GFRNONAA 41 (L) 12/06/2022   GFRAA 57 (L) 10/01/2019    Lab Results  Component Value Date   WBC 6.0 12/06/2022   NEUTROABS 4.6 12/06/2022   HGB 8.9 (L) 12/06/2022   HCT 28.4 (L) 12/06/2022   MCV 84.5 12/06/2022   PLT 241 12/06/2022     STUDIES: CT CHEST ABDOMEN PELVIS W CONTRAST  Result Date: 12/03/2022 CLINICAL DATA:  Left breast cancer with osseous metastases EXAM: CT CHEST, ABDOMEN, AND PELVIS WITH CONTRAST TECHNIQUE: Multidetector CT imaging of the chest, abdomen and pelvis was performed following the standard protocol during bolus administration of intravenous contrast. RADIATION DOSE REDUCTION: This exam was performed according to the departmental dose-optimization program which includes automated exposure control, adjustment of the mA and/or kV according to patient size and/or use of iterative reconstruction technique. CONTRAST:  80mL OMNIPAQUE IOHEXOL 300 MG/ML  SOLN COMPARISON:  CTA chest CT abdomen/pelvis dated 07/24/2022 FINDINGS: CT CHEST FINDINGS Cardiovascular: The heart is normal in size. No pericardial effusion. No evidence of thoracic aortic aneurysm. Atherosclerotic calcifications of the aortic arch. Mild coronary atherosclerosis of the LAD. Mediastinum/Nodes: No suspicious mediastinal lymphadenopathy. Visualized thyroid is unremarkable. Lungs/Pleura: Radiation changes in the anterior left upper lobe. Mild centrilobular and paraseptal emphysematous changes, upper lung predominant. Subpleural patchy opacities in the inferior right middle lobe and bilateral lower lobes. Associated lower lobe bronchiectasis. This appearance favors multifocal infection/pneumonia, possibly on the basis of aspiration. Pulmonary toxicity/chronic hypersensitivity pneumonitis could have a similar appearance. No pleural effusion or pneumothorax. Musculoskeletal:  Status post left mastectomy. Expansile lytic metastasis in the right acromion (series 2/image 4), mildly progressive. Stable expansile lytic metastasis in the left scapula (series 2/images 26). Multifocal lytic metastases in the visualized axial and appendicular skeleton (for example the T12 and L1 vertebral bodies (series 2/images 59 and 66), progressive. CT ABDOMEN PELVIS FINDINGS Hepatobiliary: Liver is within normal limits. No suspicious/enhancing hepatic lesions. Gallbladder is unremarkable. No intrahepatic or extrahepatic duct dilatation. Pancreas: Within normal limits. Spleen: Within normal limits. Adrenals/Urinary Tract: Adrenal glands are within normal limits. Right kidney  is within normal limits. Left renal cysts, including a dominant 5.9 cm simple left lower pole renal cyst (series 2/image 33), benign (Bosniak I). No follow-up is recommended. Bladder is underdistended but unremarkable. Stomach/Bowel: Stomach is within normal limits. No evidence of bowel obstruction. Normal appendix (series 2/image 94). Left colonic diverticulosis, without evidence of diverticulitis. Vascular/Lymphatic: No evidence of abdominal aortic aneurysm. Atherosclerotic calcifications of the abdominal aorta and branch vessels, although vessels remain patent. No suspicious abdominopelvic lymphadenopathy. Reproductive: Uterus is within normal limits. Left ovary is within normal limits.  No right adnexal mass. Other: No abdominopelvic ascites. Musculoskeletal: Stable expansile lytic metastasis in the left iliac bone (series 2/image 101). Lytic metastasis with pathologic fracture involving the left superior pubic ramus (series 2/image 115). IMPRESSION: Status post left mastectomy. Subpleural patchy opacities in the bilateral lower lobes, favoring multifocal infection/pneumonia, possibly on the basis of aspiration. Pulmonary toxicity/chronic hypersensitivity pneumonitis could have a similar appearance. Follow-up CT chest is suggested in  6-12 weeks after appropriate interval therapy. Progressive multifocal lytic osseous metastases, as above. Aortic Atherosclerosis (ICD10-I70.0) and Emphysema (ICD10-J43.9). Electronically Signed   By: Charline Bills M.D.   On: 12/03/2022 01:41   NM Pulmonary Perfusion  Result Date: 11/27/2022 CLINICAL DATA:  Elevated D-dimer. Short of breath. Concern for pulmonary embolism. EXAM: NUCLEAR MEDICINE PERFUSION LUNG SCAN TECHNIQUE: Perfusion images were obtained in multiple projections after intravenous injection of radiopharmaceutical. RADIOPHARMACEUTICALS:  4.8 mCi Tc-60m MAA COMPARISON:  CT chest 11/26/2022. FINDINGS: No wedge-shaped peripheral perfusion defects to suggest acute pulmonary embolism. Decreased regional perfusion to the lung bases in a non segmental pattern corresponds to airspace consolidation seen on comparison CT. IMPRESSION: 1. No evidence of acute pulmonary embolism. 2. Decreased perfusion lung bases corresponds to pneumonia seen on comparison CT Electronically Signed   By: Genevive Bi M.D.   On: 11/27/2022 14:58   MR BRAIN WO CONTRAST  Result Date: 11/27/2022 CLINICAL DATA:  Initial evaluation for headache, neuro deficit. EXAM: MRI HEAD WITHOUT CONTRAST TECHNIQUE: Multiplanar, multiecho pulse sequences of the brain and surrounding structures were obtained without intravenous contrast. COMPARISON:  Prior study from 07/07/2019. FINDINGS: Brain: Mild age-related cerebral atrophy. Few scattered punctate foci of T2/FLAIR hyperintensity involving the supratentorial cerebral white matter, nonspecific, but most commonly related to chronic microvascular ischemic disease. Changes are extremely mild for age. No evidence for acute or subacute ischemia. No acute or chronic intracranial blood products. 8 mm well-circumscribed extra-axial lesion overlying the posterior left parietal convexity (series 9, image 34). Finding favored to reflect a small meningioma, and is similar as compared to prior  brain MRI from 2021. No associated edema or mass effect. No other mass lesion or midline shift. No hydrocephalus or extra-axial fluid collection. Pituitary gland within normal limits. Vascular: Major intracranial vascular flow voids are maintained. Skull and upper cervical spine: Craniocervical junction within normal limits. Heterogeneous mass measuring 5.2 x 2.2 cm at the posterior calvarium along the midline, consistent with an osseous metastasis (series 9, image 38). Additional 2.3 cm osseous metastasis at the left calvarium (series 9, image 34). Few additional scattered smaller lesions noted elsewhere about the calvarium. Sinuses/Orbits: Globes orbital soft tissues within normal limits. Paranasal sinuses are largely clear. No significant mastoid effusion. Other: None. IMPRESSION: 1. No acute intracranial abnormality. 2. Multiple osseous metastases involving the calvarium, the largest measuring up to 5.2 cm at the posterior calvarium along the midline. 3. 8 mm extra-axial lesion overlying the posterior left parietal convexity, relatively similar as compared to prior brain MRI from 07/07/2019, and  most likely reflecting a small meningioma. No associated edema or mass effect. 4. Underlying mild age-related cerebral atrophy with mild cerebral white matter disease, nonspecific, but most likely reflecting changes of chronic microvascular ischemic disease. Electronically Signed   By: Rise Mu M.D.   On: 11/27/2022 02:00   CT CHEST WO CONTRAST  Result Date: 11/26/2022 CLINICAL DATA:  Tachycardia and palpitations. EXAM: CT CHEST WITHOUT CONTRAST TECHNIQUE: Multidetector CT imaging of the chest was performed following the standard protocol without IV contrast. RADIATION DOSE REDUCTION: This exam was performed according to the departmental dose-optimization program which includes automated exposure control, adjustment of the mA and/or kV according to patient size and/or use of iterative reconstruction  technique. COMPARISON:  CT chest abdomen and pelvis 11/21/2022. CT angiogram chest 07/24/2022. FINDINGS: Cardiovascular: No significant vascular findings. Normal heart size. No pericardial effusion. There are atherosclerotic calcifications of the aorta and coronary arteries. Mediastinum/Nodes: No enlarged mediastinal or axillary lymph nodes. Thyroid gland, trachea, and esophagus demonstrate no significant findings. Lungs/Pleura: Mild emphysematous changes are present. There are multifocal ground-glass and airspace opacities in the bilateral lower lobes. Right lower lobe bronchiectasis is again noted. Appearance is similar to most recent comparison. Postradiation changes noted in the anterior left upper lobe peripherally, unchanged. No pleural effusion or pneumothorax. There is a calcified granuloma in the right lower lobe. Upper Abdomen: No acute abnormality. Musculoskeletal: Left mastectomy changes are present. Bilateral scapular lesions are compatible with metastatic disease and appear grossly unchanged. No acute fractures are identified. IMPRESSION: 1. Multifocal ground-glass and airspace opacities in the bilateral lower lobes, similar to most recent comparison. Findings are concerning for multifocal pneumonia. COVID pneumonia can have this appearance. 2. Right lower lobe bronchiectasis. 3. Stable postradiation changes in the left upper lobe. 4. Stable osseous metastatic disease. Aortic Atherosclerosis (ICD10-I70.0) and Emphysema (ICD10-J43.9). Electronically Signed   By: Darliss Cheney M.D.   On: 11/26/2022 21:57   US Venous Img Lower Bilateral  Result Date: 11/26/2022 CLINICAL DATA:  Shortness of breath. History of malignancy and pulmonary embolism. Evaluate for DVT. EXAM: BILATERAL LOWER EXTREMITY VENOUS DOPPLER ULTRASOUND TECHNIQUE: Gray-scale sonography with graded compression, as well as color Doppler and duplex ultrasound were performed to evaluate the lower extremity deep venous systems from the level  of the common femoral vein and including the common femoral, femoral, profunda femoral, popliteal and calf veins including the posterior tibial, peroneal and gastrocnemius veins when visible. The superficial great saphenous vein was also interrogated. Spectral Doppler was utilized to evaluate flow at rest and with distal augmentation maneuvers in the common femoral, femoral and popliteal veins. COMPARISON:  Bilateral lower extremity venous Doppler ultrasound-12/01/2018 (negative). FINDINGS: RIGHT LOWER EXTREMITY Common Femoral Vein: No evidence of thrombus. Normal compressibility, respiratory phasicity and response to augmentation. Saphenofemoral Junction: No evidence of thrombus. Normal compressibility and flow on color Doppler imaging. Profunda Femoral Vein: No evidence of thrombus. Normal compressibility and flow on color Doppler imaging. Femoral Vein: No evidence of thrombus. Normal compressibility, respiratory phasicity and response to augmentation. Popliteal Vein: No evidence of thrombus. Normal compressibility, respiratory phasicity and response to augmentation. Calf Veins: No evidence of thrombus. Normal compressibility and flow on color Doppler imaging. Superficial Great Saphenous Vein: No evidence of thrombus. Normal compressibility. Other Findings:  None. LEFT LOWER EXTREMITY Common Femoral Vein: No evidence of thrombus. Normal compressibility, respiratory phasicity and response to augmentation. Saphenofemoral Junction: No evidence of thrombus. Normal compressibility and flow on color Doppler imaging. Profunda Femoral Vein: No evidence of thrombus. Normal compressibility and flow  on color Doppler imaging. Femoral Vein: No evidence of thrombus. Normal compressibility, respiratory phasicity and response to augmentation. Popliteal Vein: No evidence of thrombus. Normal compressibility, respiratory phasicity and response to augmentation. Calf Veins: No evidence of thrombus. Normal compressibility and flow on  color Doppler imaging. Superficial Great Saphenous Vein: No evidence of thrombus. Normal compressibility. Other Findings:  None. IMPRESSION: No evidence of DVT within either lower extremity. Electronically Signed   By: Simonne Come M.D.   On: 11/26/2022 17:16   DG Chest Portable 1 View  Result Date: 11/26/2022 CLINICAL DATA:  Shortness of breath. EXAM: PORTABLE CHEST 1 VIEW COMPARISON:  Chest x-ray 07/23/2022.  CT scan 11/21/2022. FINDINGS: Enlarged cardiopericardial silhouette. Tiny right effusion. No pneumothorax or edema. Chronic lung changes. Patchy consolidative like opacity along both lung bases, left-greater-than-right. Acute process is possible. Distribution is similar to the prior CT scan. IMPRESSION: Persistent lung base parenchymal opacity. Question right effusion. Recommend continued follow up to confirm clearance. Electronically Signed   By: Karen Kays M.D.   On: 11/26/2022 14:52     ONCOLOGY HISTORY: Patient initially received neoadjuvant chemotherapy with Adriamycin and Cytoxan followed by weekly Taxol. She only received 4 cycles of weekly Taxol prior to discontinuation of treatment on October 31, 2017 secondary to persistent peripheral neuropathy.  She ultimately required mastectomy and final pathology noted 5 of 6 lymph nodes positive for disease.  Patient completed adjuvant XRT in mid April 2020.  Pain initiated letrozole in May 2020, this was discontinued secondary to side effects and patient was started on anastrozole.  Nuclear medicine bone scan on June 19, 2019 revealed metastatic disease and anastrozole was subsequently discontinued.  PET scan results from October 02, 2021 revealed no clear evidence of metastatic progression.  PET scan results from June 30, 2019 reviewed independently with metastatic bony disease, but no obvious evidence of visceral disease.  MRI of the brain on August 24, 2019 reviewed independently with no obvious evidence of metastatic disease.   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  Patient's CA 27-29 has increased to 187.1, but she also has been taking treatment intermittently secondary to declining performance status.  Continue to hold Afinitor 5 mg, patient has been instructed to reinitiate Aromasin. [name] has been permanently discontinued given her issues with her mandible.  After lengthy discussion with the patient, she has requested to continue to hold treatment as her performance status improves.  Continue Aromasin as above.  Return to clinic in 6 weeks after the holidays in January 2025 for repeat laboratory work, further evaluation, and consideration of reinitiation of treatment.  Appreciate clinical pharmacy and palliative care input.    Constipation: Resolved.  Continue OTC treatments as needed. Peripheral neuropathy: Chronic and unchanged. History of pulmonary embolus: Patient was diagnosed with a small pulmonary embolus on January 10, 2018.  She is no longer on anticoagulation.  There was no evidence of recurrent PE on CT scan from October 27, 2019. Osteopenia: Patient's most recent bone mineral density on March 03, 2019 reported a T score of -1.8 which is only mildly decreased from 1 year prior when the reported T score was -1.6.  Continue calcium and vitamin D supplementation.  Patient no longer will receive bisphosphonates.   Left hip/flank pain: Patient does not complain of pain today.  Continue tramadol as prescribed.  She has  now completed XRT.  Patient was given a refill of tramadol today. Anemia: Hemoglobin has trended down slightly to 8.9, monitor. Leukopenia: Resolved. Renal insufficiency: Chronic and unchanged.  Patient's creatinine is 1.37 today.   Hypokalemia: Resolved.  Continue oral potassium supplementation. Dental pathology:  Significantly improved.  Continue follow up with Dr. Willaim Bane.  Patient has discontinued hyperbaric treatments at [facility]. Lymphedema: Follow-up with occupational therapy as scheduled. Coping/anxiety: Continue trazodone at night.  Patient was also given a referral to [facility].   Patient expressed understanding and was in agreement with this plan. She also understands that She can call clinic at any time with any questions, concerns, or complaints.    Cancer Staging  Breast cancer, stage 2, left (HCC) Staging form: Breast, AJCC 8th Edition - Clinical stage from 07/30/2017: Stage IIA (cT2, cN1, cM0, G2, ER+, PR+, HER2-) - Signed by Jeralyn Ruths, MD on 07/30/2017 Histologic grading system: 3 grade system Laterality: Left   Jeralyn Ruths, MD   12/06/2022 12:11 PM

## 2022-12-06 NOTE — Progress Notes (Signed)
Unable to obtain IV access today for IVF. Pt encouraged to increase oral fluids at home. Pt verbalized understanding

## 2022-12-06 NOTE — Progress Notes (Signed)
Palliative Medicine [facility] at [facility] [phone] [phone]   Name: Alicia [name] Date: 12/06/2022 MRN: [id number]  DOB: 02-16-1950  [name], [name], [name], [name] Jim Like, RN as Registered Nurse Jeralyn Ruths, MD as Consulting Physician (Oncology) Carmina Miller, MD as Referring Physician (Radiation Oncology) Chriss Driver, RN as Registered Nurse    REASON FOR CONSULTATION: Alicia [name] is a 72 y.o. 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  Patient with overall poor tolerance to cancer treatment and with progressive bony metastasis demonstrated on CT. Palliative care was consulted to address goals  SOCIAL HISTORY:     reports that she quit smoking about 5 years ago. Her smoking use included cigarettes. She started smoking about 35 years ago. She has a 30 pack-year smoking history. She has never used smokeless tobacco. She reports that she does not currently use alcohol. She reports that she does not use drugs.  Patient is unmarried.  She has a son with whom she lives.  ADVANCE DIRECTIVES:    CODE STATUS:   PAST MEDICAL HISTORY: Past Medical History:  Diagnosis Date   Acute hypoxic respiratory failure (HCC) 11/26/2022   AKI (acute kidney injury) (HCC) 07/18/2022   Anxiety    Breast cancer, left (HCC) 07/2017   Depression    Electrolyte abnormality 07/18/2022   Elevated troponin 02/17/2015   Hypercalcemia 11/26/2022   Hypertension    Hypothyroidism    Personal history of chemotherapy 2019   LEFT mastectomy-chemo before   Personal history of radiation therapy 03/2018   LEFT mastectomy   Rapid heart rate    Thyroid disease     PAST SURGICAL HISTORY:  Past Surgical History:  Procedure Laterality  Date   AXILLARY LYMPH NODE BIOPSY Left 07/16/2017   METASTATIC MAMMARY CARCINOMA   BREAST BIOPSY Left 07/16/2017   [place] bx of left breast mass and left breast LN.  INVASIVE MAMMARY CARCINOMA, NO SPECIAL TYPE.    BREAST EXCISIONAL BIOPSY Right 2001   benign   BREAST LUMPECTOMY WITH SENTINEL LYMPH NODE BIOPSY Left 12/06/2017   Procedure: BREAST LUMPECTOMY WITH SENTINEL LYMPH NODE BX;  Surgeon: Earline Mayotte, MD;  Location: [facility] ORS;  Service: General;  Laterality: Left;   COLONOSCOPY     MASTECTOMY Left 12/23/2017   PORTACATH PLACEMENT Right 08/07/2017   Procedure: INSERTION PORT-A-CATH;  Surgeon: Earline Mayotte, MD;  Location: [facility] ORS;  Service: General;  Laterality: Right;   SIMPLE MASTECTOMY WITH AXILLARY SENTINEL NODE BIOPSY Left 12/23/2017   T2,N2 with 6/7 nodes positive. Whole breast radiation.  Surgeon: Earline Mayotte, MD;  Location: [facility] ORS;  Service: General;  Laterality: Left;   TONSILLECTOMY      HEMATOLOGY/ONCOLOGY HISTORY:  Oncology History Overview Note  Patient is a 72 year old female who recently self palpated a mass on her left breast.  Subsequent imaging and biopsy revealed the above-stated breast cancer.  Case was also discussed extensively at case conference.  Given the size and the stage of patient's malignancy, she will benefit from neoadjuvant chemotherapy using Adriamycin, Cytoxan, and Taxol.  Patient will also require Neulasta support.  Will get CT scan of the chest, abdomen, and pelvis to assess for any metastatic disease.  Patient will also require port placement and MUGA prior to initiating treatment  CT abdomen/pelvis/chest did not  reveal any suspicious lesions concerning for metastatic disease. (08/01/17)  Port-A-Cath placed on 08/07/2017.  Cycle 1 day 1 of AC was given on 08/08/17.     Breast cancer, stage 2, left (HCC)  07/24/2017 Initial Diagnosis   Breast cancer, stage 2, left (HCC)   07/30/2017 Cancer Staging   Staging form: Breast, AJCC 8th  Edition - Clinical stage from 07/30/2017: Stage IIA (cT2, cN1, cM0, G2, ER+, PR+, HER2-) - Signed by Jeralyn Ruths, MD on 07/30/2017   08/08/2017 - 10/31/2017 Chemotherapy   The patient had DOXOrubicin (ADRIAMYCIN) chemo injection 130 mg, 60 mg/m2 = 130 mg, Intravenous,  Once, 4 of 4 cycles Administration: 130 mg (08/08/2017), 130 mg (08/22/2017), 130 mg (09/05/2017), 130 mg (09/19/2017) palonosetron (ALOXI) injection 0.25 mg, 0.25 mg, Intravenous,  Once, 4 of 4 cycles Administration: 0.25 mg (08/08/2017), 0.25 mg (08/22/2017), 0.25 mg (09/05/2017), 0.25 mg (09/19/2017) pegfilgrastim-cbqv (UDENYCA) injection 6 mg, 6 mg, Subcutaneous, Once, 4 of 4 cycles Administration: 6 mg (08/09/2017), 6 mg (08/23/2017), 6 mg (09/06/2017), 6 mg (09/20/2017) cyclophosphamide (CYTOXAN) 1,300 mg in sodium chloride 0.9 % 250 mL chemo infusion, 600 mg/m2 = 1,300 mg, Intravenous,  Once, 4 of 4 cycles Administration: 1,300 mg (08/08/2017), 1,300 mg (08/22/2017), 1,300 mg (09/05/2017), 1,300 mg (09/19/2017) PACLitaxel (TAXOL) 174 mg in sodium chloride 0.9 % 250 mL chemo infusion (</= 80mg /m2), 80 mg/m2 = 174 mg, Intravenous,  Once, 4 of 12 cycles Dose modification: 72 mg/m2 (original dose 80 mg/m2, Cycle 6, Reason: Dose not tolerated) Administration: 174 mg (10/03/2017), 156 mg (10/17/2017), 156 mg (10/24/2017), 156 mg (10/31/2017) fosaprepitant (EMEND) 150 mg, dexamethasone (DECADRON) 12 mg in sodium chloride 0.9 % 145 mL IVPB, , Intravenous,  Once, 4 of 4 cycles Administration:  (08/08/2017),  (08/22/2017),  (09/05/2017),  (09/19/2017)  for chemotherapy treatment.      ALLERGIES:  is allergic to sulfa antibiotics.  MEDICATIONS:  Current Outpatient Medications  Medication Sig Dispense Refill   acetaminophen (TYLENOL) 500 MG tablet Take 1,000 mg by mouth every 6 (six) hours as needed for moderate pain or headache.      ALPRAZolam (XANAX) 0.5 MG tablet Take 1 tablet (0.5 mg total) by mouth 2 (two) times daily as needed for anxiety. 10 tablet  0   ascorbic acid (VITAMIN C) 500 MG tablet Take 500 mg by mouth daily.     aspirin EC 81 MG tablet Take 81 mg by mouth at bedtime.      calcium carbonate (OS-CAL - DOSED IN MG OF ELEMENTAL CALCIUM) 1250 (500 Ca) MG tablet Take 1 tablet by mouth daily with breakfast. (Patient not taking: Reported on 12/06/2022)     Cholecalciferol (VITAMIN D3) 125 MCG (5000 UT) CAPS Take 5,000 Units by mouth 2 (two) times a day. (Patient not taking: Reported on 12/06/2022)     feeding supplement (ENSURE ENLIVE / ENSURE PLUS) LIQD Take 237 mLs by mouth 2 (two) times daily between meals.     levothyroxine (SYNTHROID) 100 MCG tablet Take 1 tablet (100 mcg total) by mouth daily before breakfast.     loratadine (CLARITIN) 10 MG tablet Take 10 mg by mouth daily. Take to reduce risk of rash from everolimus.     MAGNESIUM CITRATE PO Take by mouth.     magnesium oxide (MAG-OX) 400 (240 Mg) MG tablet Take 400 mg by mouth daily.     midodrine (PROAMATINE) 10 MG tablet Take 1 tablet (10 mg total) by mouth 3 (three) times daily with meals. 90 tablet 1   Multiple  Vitamin (MULTIVITAMIN WITH MINERALS) TABS tablet Take 1 tablet by mouth daily.     ondansetron (ZOFRAN) 8 MG tablet Take 1 tablet (8 mg total) by mouth every 8 (eight) hours as needed. 30 tablet 1   ondansetron (ZOFRAN) 8 MG tablet Take 1 tablet (8 mg total) by mouth every 8 (eight) hours as needed for nausea or vomiting. 20 tablet 2   pantoprazole (PROTONIX) 20 MG tablet TAKE 1 TABLET(20 MG) BY MOUTH DAILY 90 tablet 0   potassium chloride SA (KLOR-CON M) 20 MEQ tablet Take 1 tablet (20 mEq total) by mouth 2 (two) times daily. 180 tablet 3   Probiotic Product (ALIGN) 4 MG CAPS Take 1 capsule by mouth daily.     rosuvastatin (CRESTOR) 5 MG tablet Take 1 tablet (5 mg total) by mouth daily. 90 tablet 3   traMADol (ULTRAM) 50 MG tablet Take 1 tablet (50 mg total) by mouth every 6 (six) hours as needed. 60 tablet 0   traZODone (DESYREL) 50 MG tablet Take 1 tablet (50 mg  total) by mouth at bedtime. 30 tablet 2   No current facility-administered medications for this visit.    VITAL SIGNS: There were no vitals taken for this visit. There were no vitals filed for this visit.  Estimated body mass index is 27.92 kg/m as calculated from the following:   Height as of an earlier encounter on 12/06/22: 5\' 6"  (1.676 m).   Weight as of an earlier encounter on 12/06/22: 173 lb (78.5 kg).  LABS: CBC:    Component Value Date/Time   WBC 6.0 12/06/2022 0831   HGB 8.9 (L) 12/06/2022 0831   HGB 10.5 (L) 10/08/2022 0902   HGB 14.2 02/11/2011 1349   HCT 28.4 (L) 12/06/2022 0831   HCT 40.9 02/11/2011 1349   PLT 241 12/06/2022 0831   PLT 249 10/08/2022 0902   PLT 151 02/11/2011 1349   MCV 84.5 12/06/2022 0831   MCV 89 02/11/2011 1349   NEUTROABS 4.6 12/06/2022 0831   LYMPHSABS 0.4 (L) 12/06/2022 0831   MONOABS 0.6 12/06/2022 0831   EOSABS 0.2 12/06/2022 0831   BASOSABS 0.1 12/06/2022 0831   Comprehensive Metabolic Panel:    Component Value Date/Time   NA 142 12/06/2022 0831   NA 142 02/11/2011 1349   K 4.1 12/06/2022 0831   K 3.8 02/11/2011 1349   CL 106 12/06/2022 0831   CL 107 02/11/2011 1349   CO2 23 12/06/2022 0831   CO2 24 02/11/2011 1349   BUN 17 12/06/2022 0831   BUN 14 02/11/2011 1349   CREATININE 1.37 (H) 12/06/2022 0831   CREATININE 0.65 02/11/2011 1349   GLUCOSE 111 (H) 12/06/2022 0831   GLUCOSE 98 02/11/2011 1349   CALCIUM 9.8 12/06/2022 0831   CALCIUM 9.0 02/11/2011 1349   AST 30 12/06/2022 0831   ALT 22 12/06/2022 0831   ALT 36 02/11/2011 1349   ALKPHOS 125 12/06/2022 0831   ALKPHOS 122 02/11/2011 1349   BILITOT 0.7 12/06/2022 0831   PROT 6.8 12/06/2022 0831   PROT 7.0 02/11/2011 1349   ALBUMIN 3.5 12/06/2022 0831   ALBUMIN 4.1 02/11/2011 1349    RADIOGRAPHIC STUDIES: CT CHEST ABDOMEN PELVIS W CONTRAST  Result Date: 12/03/2022 CLINICAL DATA:  Left breast cancer with osseous metastases EXAM: CT CHEST, ABDOMEN, AND PELVIS  WITH CONTRAST TECHNIQUE: Multidetector CT imaging of the chest, abdomen and pelvis was performed following the standard protocol during bolus administration of intravenous contrast. RADIATION DOSE REDUCTION: This exam was performed according  to the departmental dose-optimization program which includes automated exposure control, adjustment of the mA and/or kV according to patient size and/or use of iterative reconstruction technique. CONTRAST:  80mL OMNIPAQUE IOHEXOL 300 MG/ML  SOLN COMPARISON:  CTA chest CT abdomen/pelvis dated 07/24/2022 FINDINGS: CT CHEST FINDINGS Cardiovascular: The heart is normal in size. No pericardial effusion. No evidence of thoracic aortic aneurysm. Atherosclerotic calcifications of the aortic arch. Mild coronary atherosclerosis of the LAD. Mediastinum/Nodes: No suspicious mediastinal lymphadenopathy. Visualized thyroid is unremarkable. Lungs/Pleura: Radiation changes in the anterior left upper lobe. Mild centrilobular and paraseptal emphysematous changes, upper lung predominant. Subpleural patchy opacities in the inferior right middle lobe and bilateral lower lobes. Associated lower lobe bronchiectasis. This appearance favors multifocal infection/pneumonia, possibly on the basis of aspiration. Pulmonary toxicity/chronic hypersensitivity pneumonitis could have a similar appearance. No pleural effusion or pneumothorax. Musculoskeletal: Status post left mastectomy. Expansile lytic metastasis in the right acromion (series 2/image 4), mildly progressive. Stable expansile lytic metastasis in the left scapula (series 2/images 26). Multifocal lytic metastases in the visualized axial and appendicular skeleton (for example the T12 and L1 vertebral bodies (series 2/images 59 and 66), progressive. CT ABDOMEN PELVIS FINDINGS Hepatobiliary: Liver is within normal limits. No suspicious/enhancing hepatic lesions. Gallbladder is unremarkable. No intrahepatic or extrahepatic duct dilatation. Pancreas:  Within normal limits. Spleen: Within normal limits. Adrenals/Urinary Tract: Adrenal glands are within normal limits. Right kidney is within normal limits. Left renal cysts, including a dominant 5.9 cm simple left lower pole renal cyst (series 2/image 33), benign (Bosniak I). No follow-up is recommended. Bladder is underdistended but unremarkable. Stomach/Bowel: Stomach is within normal limits. No evidence of bowel obstruction. Normal appendix (series 2/image 94). Left colonic diverticulosis, without evidence of diverticulitis. Vascular/Lymphatic: No evidence of abdominal aortic aneurysm. Atherosclerotic calcifications of the abdominal aorta and branch vessels, although vessels remain patent. No suspicious abdominopelvic lymphadenopathy. Reproductive: Uterus is within normal limits. Left ovary is within normal limits.  No right adnexal mass. Other: No abdominopelvic ascites. Musculoskeletal: Stable expansile lytic metastasis in the left iliac bone (series 2/image 101). Lytic metastasis with pathologic fracture involving the left superior pubic ramus (series 2/image 115). IMPRESSION: Status post left mastectomy. Subpleural patchy opacities in the bilateral lower lobes, favoring multifocal infection/pneumonia, possibly on the basis of aspiration. Pulmonary toxicity/chronic hypersensitivity pneumonitis could have a similar appearance. Follow-up CT chest is suggested in 6-12 weeks after appropriate interval therapy. Progressive multifocal lytic osseous metastases, as above. Aortic Atherosclerosis (ICD10-I70.0) and Emphysema (ICD10-J43.9). Electronically Signed   By: Charline Bills M.D.   On: 12/03/2022 01:41   NM Pulmonary Perfusion  Result Date: 11/27/2022 CLINICAL DATA:  Elevated D-dimer. Short of breath. Concern for pulmonary embolism. EXAM: NUCLEAR MEDICINE PERFUSION LUNG SCAN TECHNIQUE: Perfusion images were obtained in multiple projections after intravenous injection of radiopharmaceutical.  RADIOPHARMACEUTICALS:  4.8 mCi Tc-29m MAA COMPARISON:  CT chest 11/26/2022. FINDINGS: No wedge-shaped peripheral perfusion defects to suggest acute pulmonary embolism. Decreased regional perfusion to the lung bases in a non segmental pattern corresponds to airspace consolidation seen on comparison CT. IMPRESSION: 1. No evidence of acute pulmonary embolism. 2. Decreased perfusion lung bases corresponds to pneumonia seen on comparison CT Electronically Signed   By: Genevive Bi M.D.   On: 11/27/2022 14:58   MR BRAIN WO CONTRAST  Result Date: 11/27/2022 CLINICAL DATA:  Initial evaluation for headache, neuro deficit. EXAM: MRI HEAD WITHOUT CONTRAST TECHNIQUE: Multiplanar, multiecho pulse sequences of the brain and surrounding structures were obtained without intravenous contrast. COMPARISON:  Prior study from 07/07/2019. FINDINGS:  Brain: Mild age-related cerebral atrophy. Few scattered punctate foci of T2/FLAIR hyperintensity involving the supratentorial cerebral white matter, nonspecific, but most commonly related to chronic microvascular ischemic disease. Changes are extremely mild for age. No evidence for acute or subacute ischemia. No acute or chronic intracranial blood products. 8 mm well-circumscribed extra-axial lesion overlying the posterior left parietal convexity (series 9, image 34). Finding favored to reflect a small meningioma, and is similar as compared to prior brain MRI from 2021. No associated edema or mass effect. No other mass lesion or midline shift. No hydrocephalus or extra-axial fluid collection. Pituitary gland within normal limits. Vascular: Major intracranial vascular flow voids are maintained. Skull and upper cervical spine: Craniocervical junction within normal limits. Heterogeneous mass measuring 5.2 x 2.2 cm at the posterior calvarium along the midline, consistent with an osseous metastasis (series 9, image 38). Additional 2.3 cm osseous metastasis at the left calvarium (series 9,  image 34). Few additional scattered smaller lesions noted elsewhere about the calvarium. Sinuses/Orbits: Globes orbital soft tissues within normal limits. Paranasal sinuses are largely clear. No significant mastoid effusion. Other: None. IMPRESSION: 1. No acute intracranial abnormality. 2. Multiple osseous metastases involving the calvarium, the largest measuring up to 5.2 cm at the posterior calvarium along the midline. 3. 8 mm extra-axial lesion overlying the posterior left parietal convexity, relatively similar as compared to prior brain MRI from 07/07/2019, and most likely reflecting a small meningioma. No associated edema or mass effect. 4. Underlying mild age-related cerebral atrophy with mild cerebral white matter disease, nonspecific, but most likely reflecting changes of chronic microvascular ischemic disease. Electronically Signed   By: Rise Mu M.D.   On: 11/27/2022 02:00   CT CHEST WO CONTRAST  Result Date: 11/26/2022 CLINICAL DATA:  Tachycardia and palpitations. EXAM: CT CHEST WITHOUT CONTRAST TECHNIQUE: Multidetector CT imaging of the chest was performed following the standard protocol without IV contrast. RADIATION DOSE REDUCTION: This exam was performed according to the departmental dose-optimization program which includes automated exposure control, adjustment of the mA and/or kV according to patient size and/or use of iterative reconstruction technique. COMPARISON:  CT chest abdomen and pelvis 11/21/2022. CT angiogram chest 07/24/2022. FINDINGS: Cardiovascular: No significant vascular findings. Normal heart size. No pericardial effusion. There are atherosclerotic calcifications of the aorta and coronary arteries. Mediastinum/Nodes: No enlarged mediastinal or axillary lymph nodes. Thyroid gland, trachea, and esophagus demonstrate no significant findings. Lungs/Pleura: Mild emphysematous changes are present. There are multifocal ground-glass and airspace opacities in the bilateral  lower lobes. Right lower lobe bronchiectasis is again noted. Appearance is similar to most recent comparison. Postradiation changes noted in the anterior left upper lobe peripherally, unchanged. No pleural effusion or pneumothorax. There is a calcified granuloma in the right lower lobe. Upper Abdomen: No acute abnormality. Musculoskeletal: Left mastectomy changes are present. Bilateral scapular lesions are compatible with metastatic disease and appear grossly unchanged. No acute fractures are identified. IMPRESSION: 1. Multifocal ground-glass and airspace opacities in the bilateral lower lobes, similar to most recent comparison. Findings are concerning for multifocal pneumonia. COVID pneumonia can have this appearance. 2. Right lower lobe bronchiectasis. 3. Stable postradiation changes in the left upper lobe. 4. Stable osseous metastatic disease. Aortic Atherosclerosis (ICD10-I70.0) and Emphysema (ICD10-J43.9). Electronically Signed   By: Darliss Cheney M.D.   On: 11/26/2022 21:57   US Venous Img Lower Bilateral  Result Date: 11/26/2022 CLINICAL DATA:  Shortness of breath. History of malignancy and pulmonary embolism. Evaluate for DVT. EXAM: BILATERAL LOWER EXTREMITY VENOUS DOPPLER ULTRASOUND TECHNIQUE: Gray-scale sonography  with graded compression, as well as color Doppler and duplex ultrasound were performed to evaluate the lower extremity deep venous systems from the level of the common femoral vein and including the common femoral, femoral, profunda femoral, popliteal and calf veins including the posterior tibial, peroneal and gastrocnemius veins when visible. The superficial great saphenous vein was also interrogated. Spectral Doppler was utilized to evaluate flow at rest and with distal augmentation maneuvers in the common femoral, femoral and popliteal veins. COMPARISON:  Bilateral lower extremity venous Doppler ultrasound-12/01/2018 (negative). FINDINGS: RIGHT LOWER EXTREMITY Common Femoral Vein: No  evidence of thrombus. Normal compressibility, respiratory phasicity and response to augmentation. Saphenofemoral Junction: No evidence of thrombus. Normal compressibility and flow on color Doppler imaging. Profunda Femoral Vein: No evidence of thrombus. Normal compressibility and flow on color Doppler imaging. Femoral Vein: No evidence of thrombus. Normal compressibility, respiratory phasicity and response to augmentation. Popliteal Vein: No evidence of thrombus. Normal compressibility, respiratory phasicity and response to augmentation. Calf Veins: No evidence of thrombus. Normal compressibility and flow on color Doppler imaging. Superficial Great Saphenous Vein: No evidence of thrombus. Normal compressibility. Other Findings:  None. LEFT LOWER EXTREMITY Common Femoral Vein: No evidence of thrombus. Normal compressibility, respiratory phasicity and response to augmentation. Saphenofemoral Junction: No evidence of thrombus. Normal compressibility and flow on color Doppler imaging. Profunda Femoral Vein: No evidence of thrombus. Normal compressibility and flow on color Doppler imaging. Femoral Vein: No evidence of thrombus. Normal compressibility, respiratory phasicity and response to augmentation. Popliteal Vein: No evidence of thrombus. Normal compressibility, respiratory phasicity and response to augmentation. Calf Veins: No evidence of thrombus. Normal compressibility and flow on color Doppler imaging. Superficial Great Saphenous Vein: No evidence of thrombus. Normal compressibility. Other Findings:  None. IMPRESSION: No evidence of DVT within either lower extremity. Electronically Signed   By: Simonne Come M.D.   On: 11/26/2022 17:16   DG Chest Portable 1 View  Result Date: 11/26/2022 CLINICAL DATA:  Shortness of breath. EXAM: PORTABLE CHEST 1 VIEW COMPARISON:  Chest x-ray 07/23/2022.  CT scan 11/21/2022. FINDINGS: Enlarged cardiopericardial silhouette. Tiny right effusion. No pneumothorax or edema. Chronic  lung changes. Patchy consolidative like opacity along both lung bases, left-greater-than-right. Acute process is possible. Distribution is similar to the prior CT scan. IMPRESSION: Persistent lung base parenchymal opacity. Question right effusion. Recommend continued follow up to confirm clearance. Electronically Signed   By: Karen Kays M.D.   On: 11/26/2022 14:52    PERFORMANCE STATUS (ECOG) : 2 - Symptomatic, <50% confined to bed  Review of Systems Unless otherwise noted, a complete review of systems is negative.  Physical Exam General: NAD Pulmonary: Unlabored Extremities: no edema, no joint deformities Skin: no rashes Neurological: Weakness but otherwise nonfocal  IMPRESSION: Patient was recently hospitalized with respiratory failure found to have multifocal pneumonia.  Also with hypercalcemia with CT demonstrated progressive osseous metastatic disease.  Patient has had poor tolerance to treatment, most recently on Afinitor.  Met with patient today to discuss goals.  Patient reports overall decline over the last several months.  She has previously living independently but now lives with her son and daughter-in-law.  She has been off treatment since her hospitalization and overall feels like she is improving.  She hopes to continue improving but recognizes that she will likely will not return to her previous performance status.  Patient would like to continue holding treatment for now and reassess in 1 to 2 months.  She understands that holding treatment may result in disease progression.  We did discuss the possible future option of hospice.  Also discussed quality of life versus quantity of life.  Symptomatically, she is currently doing well.  Denies any distressing symptoms at present.  Patient does not have advance directives but is interested in establishing those.  Will consult SW to assist.  I sent patient home with a MOST form today.  Patient says that she has not really talked  much about end-of-life decision making but plans to address with family.  PLAN: -Continue current scope of treatment -Referral to social work to assist with ACP documents -Patient sent home with a MOST form to review -Follow-up in 6 weeks  Case and plan discussed with [name]  Patient expressed understanding and was in agreement with this plan. She also understands that She can call the clinic at any time with any questions, concerns, or complaints.     Time Total: 25 minutes  Visit consisted of counseling and education dealing with the complex and emotionally intense issues of symptom management and palliative care in the setting of serious and potentially life-threatening illness.Greater than 50%  of this time was spent counseling and coordinating care related to the above assessment and plan.  Signed by: Laurette Schimke, PhD, NP-C

## 2022-12-06 NOTE — Progress Notes (Signed)
[facility] Clinical Social Work  Clinical Social Work was referred by medical provider for assessment of psychosocial needs.  Clinical Social Worker contacted patient by phone to offer support and assess for needs.    Patient stated she was interested in completing her advance directives.  CSW provided education regarding the process and she said she understood.  Informed her the next clinic was from 10 - 12 on Thursday, Dec. 12th.  She is going to speak with her family about transportation and will let CSW know when she is available.     Dorothey Baseman, LCSW  Clinical Social Worker [facility]

## 2022-12-07 ENCOUNTER — Other Ambulatory Visit: Payer: Self-pay

## 2022-12-07 DIAGNOSIS — E7800 Pure hypercholesterolemia, unspecified: Secondary | ICD-10-CM | POA: Diagnosis not present

## 2022-12-07 DIAGNOSIS — C50912 Malignant neoplasm of unspecified site of left female breast: Secondary | ICD-10-CM | POA: Diagnosis not present

## 2022-12-07 DIAGNOSIS — Z9181 History of falling: Secondary | ICD-10-CM | POA: Diagnosis not present

## 2022-12-07 DIAGNOSIS — I129 Hypertensive chronic kidney disease with stage 1 through stage 4 chronic kidney disease, or unspecified chronic kidney disease: Secondary | ICD-10-CM | POA: Diagnosis not present

## 2022-12-07 DIAGNOSIS — J189 Pneumonia, unspecified organism: Secondary | ICD-10-CM | POA: Diagnosis not present

## 2022-12-07 DIAGNOSIS — C7951 Secondary malignant neoplasm of bone: Secondary | ICD-10-CM | POA: Diagnosis not present

## 2022-12-07 DIAGNOSIS — F419 Anxiety disorder, unspecified: Secondary | ICD-10-CM | POA: Diagnosis not present

## 2022-12-07 DIAGNOSIS — I10 Essential (primary) hypertension: Secondary | ICD-10-CM | POA: Diagnosis not present

## 2022-12-07 DIAGNOSIS — I951 Orthostatic hypotension: Secondary | ICD-10-CM | POA: Diagnosis not present

## 2022-12-07 DIAGNOSIS — Z7982 Long term (current) use of aspirin: Secondary | ICD-10-CM | POA: Diagnosis not present

## 2022-12-07 DIAGNOSIS — K219 Gastro-esophageal reflux disease without esophagitis: Secondary | ICD-10-CM | POA: Diagnosis not present

## 2022-12-07 DIAGNOSIS — N179 Acute kidney failure, unspecified: Secondary | ICD-10-CM | POA: Diagnosis not present

## 2022-12-07 DIAGNOSIS — E039 Hypothyroidism, unspecified: Secondary | ICD-10-CM | POA: Diagnosis not present

## 2022-12-07 DIAGNOSIS — J9601 Acute respiratory failure with hypoxia: Secondary | ICD-10-CM | POA: Diagnosis not present

## 2022-12-07 DIAGNOSIS — N1831 Chronic kidney disease, stage 3a: Secondary | ICD-10-CM | POA: Diagnosis not present

## 2022-12-07 DIAGNOSIS — I441 Atrioventricular block, second degree: Secondary | ICD-10-CM | POA: Diagnosis not present

## 2022-12-07 DIAGNOSIS — F32A Depression, unspecified: Secondary | ICD-10-CM | POA: Diagnosis not present

## 2022-12-07 LAB — CANCER ANTIGEN 27.29: CA 27.29: 230.8 U/mL — ABNORMAL HIGH (ref 0.0–38.6)

## 2022-12-07 NOTE — Progress Notes (Signed)
Patient's everolimus has been discontinued. She will follow up in about 6 weeks and if treatment is resumed it will be with alternative medication per Alyson. Disenrolled patient.

## 2022-12-10 ENCOUNTER — Telehealth: Payer: Self-pay

## 2022-12-10 ENCOUNTER — Inpatient Hospital Stay: Payer: Medicare Other

## 2022-12-10 DIAGNOSIS — C7951 Secondary malignant neoplasm of bone: Secondary | ICD-10-CM | POA: Diagnosis not present

## 2022-12-10 DIAGNOSIS — J189 Pneumonia, unspecified organism: Secondary | ICD-10-CM | POA: Diagnosis not present

## 2022-12-10 DIAGNOSIS — C50912 Malignant neoplasm of unspecified site of left female breast: Secondary | ICD-10-CM | POA: Diagnosis not present

## 2022-12-10 DIAGNOSIS — J9601 Acute respiratory failure with hypoxia: Secondary | ICD-10-CM | POA: Diagnosis not present

## 2022-12-10 NOTE — Progress Notes (Signed)
[facility] CSW Progress Note  Clinical Child psychotherapist contacted patient by phone to confirm Advance Directives appointment.  Patient stated a family member will be transporting her since she cannot drive.  She did not have any further questions.    Dorothey Baseman, LCSW Clinical Social Worker [facility]

## 2022-12-10 NOTE — Telephone Encounter (Signed)
Clinical Social Worker returned patient's call and left a message confirming her Advance Directives session on 12/12 at 11:30.

## 2022-12-11 ENCOUNTER — Ambulatory Visit: Payer: No Typology Code available for payment source

## 2022-12-17 DIAGNOSIS — C50912 Malignant neoplasm of unspecified site of left female breast: Secondary | ICD-10-CM | POA: Diagnosis not present

## 2022-12-17 DIAGNOSIS — C7951 Secondary malignant neoplasm of bone: Secondary | ICD-10-CM | POA: Diagnosis not present

## 2022-12-17 DIAGNOSIS — J9601 Acute respiratory failure with hypoxia: Secondary | ICD-10-CM | POA: Diagnosis not present

## 2022-12-17 DIAGNOSIS — J189 Pneumonia, unspecified organism: Secondary | ICD-10-CM | POA: Diagnosis not present

## 2022-12-18 ENCOUNTER — Encounter: Payer: Self-pay | Admitting: Oncology

## 2022-12-18 ENCOUNTER — Ambulatory Visit

## 2022-12-20 ENCOUNTER — Ambulatory Visit

## 2022-12-20 DIAGNOSIS — J189 Pneumonia, unspecified organism: Secondary | ICD-10-CM | POA: Diagnosis not present

## 2022-12-20 DIAGNOSIS — C50912 Malignant neoplasm of unspecified site of left female breast: Secondary | ICD-10-CM | POA: Diagnosis not present

## 2022-12-20 DIAGNOSIS — C7951 Secondary malignant neoplasm of bone: Secondary | ICD-10-CM | POA: Diagnosis not present

## 2022-12-20 DIAGNOSIS — J9601 Acute respiratory failure with hypoxia: Secondary | ICD-10-CM | POA: Diagnosis not present

## 2022-12-25 ENCOUNTER — Ambulatory Visit

## 2022-12-25 DIAGNOSIS — J9601 Acute respiratory failure with hypoxia: Secondary | ICD-10-CM | POA: Diagnosis not present

## 2022-12-25 DIAGNOSIS — C50912 Malignant neoplasm of unspecified site of left female breast: Secondary | ICD-10-CM | POA: Diagnosis not present

## 2022-12-25 DIAGNOSIS — C7951 Secondary malignant neoplasm of bone: Secondary | ICD-10-CM | POA: Diagnosis not present

## 2022-12-25 DIAGNOSIS — J189 Pneumonia, unspecified organism: Secondary | ICD-10-CM | POA: Diagnosis not present

## 2022-12-27 ENCOUNTER — Ambulatory Visit

## 2022-12-27 ENCOUNTER — Inpatient Hospital Stay: Payer: Medicare Other | Attending: Oncology

## 2022-12-27 DIAGNOSIS — Z170 Estrogen receptor positive status [ER+]: Secondary | ICD-10-CM | POA: Insufficient documentation

## 2022-12-27 DIAGNOSIS — C50912 Malignant neoplasm of unspecified site of left female breast: Secondary | ICD-10-CM | POA: Insufficient documentation

## 2022-12-27 DIAGNOSIS — C7951 Secondary malignant neoplasm of bone: Secondary | ICD-10-CM | POA: Insufficient documentation

## 2022-12-27 NOTE — Progress Notes (Signed)
[facility] Healthcare Advance Directives Clinical Social Work  Patient presented to Advance Directives Clinic  to review and complete healthcare advance directives.  Patient had several questions regarding the documents and wanted additional time to read the documents and to make her decision.    Made another appointment for January 7th at 11:30am to further review and complete the documents if appropriate.  Clinical Social Worker encouraged patient/family to contact with any additional questions or concerns.   Dorothey Baseman, LCSW Clinical Social Worker [facility]

## 2023-01-01 ENCOUNTER — Ambulatory Visit

## 2023-01-01 DIAGNOSIS — C50912 Malignant neoplasm of unspecified site of left female breast: Secondary | ICD-10-CM | POA: Diagnosis not present

## 2023-01-01 DIAGNOSIS — J9601 Acute respiratory failure with hypoxia: Secondary | ICD-10-CM | POA: Diagnosis not present

## 2023-01-01 DIAGNOSIS — C7951 Secondary malignant neoplasm of bone: Secondary | ICD-10-CM | POA: Diagnosis not present

## 2023-01-01 DIAGNOSIS — J189 Pneumonia, unspecified organism: Secondary | ICD-10-CM | POA: Diagnosis not present

## 2023-01-03 ENCOUNTER — Ambulatory Visit

## 2023-01-06 DIAGNOSIS — N1831 Chronic kidney disease, stage 3a: Secondary | ICD-10-CM | POA: Diagnosis not present

## 2023-01-06 DIAGNOSIS — Z7982 Long term (current) use of aspirin: Secondary | ICD-10-CM | POA: Diagnosis not present

## 2023-01-06 DIAGNOSIS — C50912 Malignant neoplasm of unspecified site of left female breast: Secondary | ICD-10-CM | POA: Diagnosis not present

## 2023-01-06 DIAGNOSIS — F32A Depression, unspecified: Secondary | ICD-10-CM | POA: Diagnosis not present

## 2023-01-06 DIAGNOSIS — Z9181 History of falling: Secondary | ICD-10-CM | POA: Diagnosis not present

## 2023-01-06 DIAGNOSIS — J189 Pneumonia, unspecified organism: Secondary | ICD-10-CM | POA: Diagnosis not present

## 2023-01-06 DIAGNOSIS — J9601 Acute respiratory failure with hypoxia: Secondary | ICD-10-CM | POA: Diagnosis not present

## 2023-01-06 DIAGNOSIS — I441 Atrioventricular block, second degree: Secondary | ICD-10-CM | POA: Diagnosis not present

## 2023-01-06 DIAGNOSIS — I951 Orthostatic hypotension: Secondary | ICD-10-CM | POA: Diagnosis not present

## 2023-01-06 DIAGNOSIS — K219 Gastro-esophageal reflux disease without esophagitis: Secondary | ICD-10-CM | POA: Diagnosis not present

## 2023-01-06 DIAGNOSIS — E7800 Pure hypercholesterolemia, unspecified: Secondary | ICD-10-CM | POA: Diagnosis not present

## 2023-01-06 DIAGNOSIS — I10 Essential (primary) hypertension: Secondary | ICD-10-CM | POA: Diagnosis not present

## 2023-01-06 DIAGNOSIS — I129 Hypertensive chronic kidney disease with stage 1 through stage 4 chronic kidney disease, or unspecified chronic kidney disease: Secondary | ICD-10-CM | POA: Diagnosis not present

## 2023-01-06 DIAGNOSIS — C7951 Secondary malignant neoplasm of bone: Secondary | ICD-10-CM | POA: Diagnosis not present

## 2023-01-06 DIAGNOSIS — F419 Anxiety disorder, unspecified: Secondary | ICD-10-CM | POA: Diagnosis not present

## 2023-01-06 DIAGNOSIS — N179 Acute kidney failure, unspecified: Secondary | ICD-10-CM | POA: Diagnosis not present

## 2023-01-06 DIAGNOSIS — E039 Hypothyroidism, unspecified: Secondary | ICD-10-CM | POA: Diagnosis not present

## 2023-01-08 ENCOUNTER — Ambulatory Visit

## 2023-01-08 DIAGNOSIS — C50912 Malignant neoplasm of unspecified site of left female breast: Secondary | ICD-10-CM | POA: Diagnosis not present

## 2023-01-08 DIAGNOSIS — C7951 Secondary malignant neoplasm of bone: Secondary | ICD-10-CM | POA: Diagnosis not present

## 2023-01-08 DIAGNOSIS — J9601 Acute respiratory failure with hypoxia: Secondary | ICD-10-CM | POA: Diagnosis not present

## 2023-01-08 DIAGNOSIS — J189 Pneumonia, unspecified organism: Secondary | ICD-10-CM | POA: Diagnosis not present

## 2023-01-10 ENCOUNTER — Ambulatory Visit

## 2023-01-10 ENCOUNTER — Other Ambulatory Visit: Payer: Self-pay | Admitting: Pharmacist

## 2023-01-10 DIAGNOSIS — C50912 Malignant neoplasm of unspecified site of left female breast: Secondary | ICD-10-CM

## 2023-01-11 ENCOUNTER — Other Ambulatory Visit: Payer: Self-pay | Admitting: Oncology

## 2023-01-11 DIAGNOSIS — F419 Anxiety disorder, unspecified: Secondary | ICD-10-CM

## 2023-01-12 ENCOUNTER — Other Ambulatory Visit: Payer: Self-pay | Admitting: Pharmacist

## 2023-01-12 DIAGNOSIS — C50912 Malignant neoplasm of unspecified site of left female breast: Secondary | ICD-10-CM

## 2023-01-14 ENCOUNTER — Inpatient Hospital Stay: Payer: Medicare Other

## 2023-01-14 DIAGNOSIS — J189 Pneumonia, unspecified organism: Secondary | ICD-10-CM | POA: Diagnosis not present

## 2023-01-14 DIAGNOSIS — J9601 Acute respiratory failure with hypoxia: Secondary | ICD-10-CM | POA: Diagnosis not present

## 2023-01-14 DIAGNOSIS — C7951 Secondary malignant neoplasm of bone: Secondary | ICD-10-CM | POA: Diagnosis not present

## 2023-01-14 DIAGNOSIS — C50912 Malignant neoplasm of unspecified site of left female breast: Secondary | ICD-10-CM | POA: Diagnosis not present

## 2023-01-14 DIAGNOSIS — Z170 Estrogen receptor positive status [ER+]: Secondary | ICD-10-CM | POA: Diagnosis not present

## 2023-01-14 LAB — CBC WITH DIFFERENTIAL/PLATELET
Abs Immature Granulocytes: 0.12 10*3/uL — ABNORMAL HIGH (ref 0.00–0.07)
Basophils Absolute: 0 10*3/uL (ref 0.0–0.1)
Basophils Relative: 1 %
Eosinophils Absolute: 0.2 10*3/uL (ref 0.0–0.5)
Eosinophils Relative: 3 %
HCT: 30 % — ABNORMAL LOW (ref 36.0–46.0)
Hemoglobin: 9.5 g/dL — ABNORMAL LOW (ref 12.0–15.0)
Immature Granulocytes: 2 %
Lymphocytes Relative: 4 %
Lymphs Abs: 0.2 10*3/uL — ABNORMAL LOW (ref 0.7–4.0)
MCH: 28.8 pg (ref 26.0–34.0)
MCHC: 31.7 g/dL (ref 30.0–36.0)
MCV: 90.9 fL (ref 80.0–100.0)
Monocytes Absolute: 0.7 10*3/uL (ref 0.1–1.0)
Monocytes Relative: 11 %
Neutro Abs: 4.9 10*3/uL (ref 1.7–7.7)
Neutrophils Relative %: 79 %
Platelets: 378 10*3/uL (ref 150–400)
RBC: 3.3 MIL/uL — ABNORMAL LOW (ref 3.87–5.11)
RDW: 20.6 % — ABNORMAL HIGH (ref 11.5–15.5)
WBC: 6.1 10*3/uL (ref 4.0–10.5)
nRBC: 0 % (ref 0.0–0.2)

## 2023-01-14 LAB — CMP (CANCER CENTER ONLY)
ALT: 16 U/L (ref 0–44)
AST: 24 U/L (ref 15–41)
Albumin: 3.5 g/dL (ref 3.5–5.0)
Alkaline Phosphatase: 163 U/L — ABNORMAL HIGH (ref 38–126)
Anion gap: 11 (ref 5–15)
BUN: 16 mg/dL (ref 8–23)
CO2: 24 mmol/L (ref 22–32)
Calcium: 9.3 mg/dL (ref 8.9–10.3)
Chloride: 100 mmol/L (ref 98–111)
Creatinine: 1.31 mg/dL — ABNORMAL HIGH (ref 0.44–1.00)
GFR, Estimated: 43 mL/min — ABNORMAL LOW (ref >60)
Glucose, Bld: 105 mg/dL — ABNORMAL HIGH (ref 70–99)
Potassium: 4.5 mmol/L (ref 3.5–5.1)
Sodium: 135 mmol/L (ref 135–145)
Total Bilirubin: 0.4 mg/dL (ref 0.0–1.2)
Total Protein: 6.7 g/dL (ref 6.5–8.1)

## 2023-01-15 DIAGNOSIS — C7951 Secondary malignant neoplasm of bone: Secondary | ICD-10-CM | POA: Diagnosis not present

## 2023-01-15 DIAGNOSIS — J9601 Acute respiratory failure with hypoxia: Secondary | ICD-10-CM | POA: Diagnosis not present

## 2023-01-15 DIAGNOSIS — J189 Pneumonia, unspecified organism: Secondary | ICD-10-CM | POA: Diagnosis not present

## 2023-01-15 DIAGNOSIS — C50912 Malignant neoplasm of unspecified site of left female breast: Secondary | ICD-10-CM | POA: Diagnosis not present

## 2023-01-15 LAB — CANCER ANTIGEN 27.29: CA 27.29: 243.2 U/mL — ABNORMAL HIGH (ref 0.0–38.6)

## 2023-01-17 ENCOUNTER — Inpatient Hospital Stay (HOSPITAL_BASED_OUTPATIENT_CLINIC_OR_DEPARTMENT_OTHER): Payer: Medicare Other | Admitting: Hospice and Palliative Medicine

## 2023-01-17 ENCOUNTER — Encounter: Payer: Self-pay | Admitting: Oncology

## 2023-01-17 ENCOUNTER — Ambulatory Visit: Payer: Medicare Other | Admitting: Oncology

## 2023-01-17 ENCOUNTER — Inpatient Hospital Stay: Payer: Medicare Other | Attending: Oncology | Admitting: Oncology

## 2023-01-17 ENCOUNTER — Encounter: Payer: Self-pay | Admitting: Hospice and Palliative Medicine

## 2023-01-17 VITALS — BP 118/78 | HR 86 | Temp 98.9°F | Resp 16 | Ht 66.0 in | Wt 178.0 lb

## 2023-01-17 DIAGNOSIS — Z87891 Personal history of nicotine dependence: Secondary | ICD-10-CM | POA: Diagnosis not present

## 2023-01-17 DIAGNOSIS — C50912 Malignant neoplasm of unspecified site of left female breast: Secondary | ICD-10-CM | POA: Insufficient documentation

## 2023-01-17 DIAGNOSIS — Z515 Encounter for palliative care: Secondary | ICD-10-CM | POA: Diagnosis not present

## 2023-01-17 DIAGNOSIS — C7951 Secondary malignant neoplasm of bone: Secondary | ICD-10-CM

## 2023-01-17 DIAGNOSIS — Z86711 Personal history of pulmonary embolism: Secondary | ICD-10-CM | POA: Diagnosis not present

## 2023-01-17 DIAGNOSIS — N189 Chronic kidney disease, unspecified: Secondary | ICD-10-CM | POA: Diagnosis not present

## 2023-01-17 DIAGNOSIS — Z86718 Personal history of other venous thrombosis and embolism: Secondary | ICD-10-CM | POA: Diagnosis not present

## 2023-01-17 DIAGNOSIS — D649 Anemia, unspecified: Secondary | ICD-10-CM | POA: Diagnosis not present

## 2023-01-17 DIAGNOSIS — I890 Lymphedema, not elsewhere classified: Secondary | ICD-10-CM | POA: Insufficient documentation

## 2023-01-17 DIAGNOSIS — F419 Anxiety disorder, unspecified: Secondary | ICD-10-CM | POA: Insufficient documentation

## 2023-01-17 DIAGNOSIS — G629 Polyneuropathy, unspecified: Secondary | ICD-10-CM | POA: Insufficient documentation

## 2023-01-17 DIAGNOSIS — R531 Weakness: Secondary | ICD-10-CM | POA: Insufficient documentation

## 2023-01-17 DIAGNOSIS — M8580 Other specified disorders of bone density and structure, unspecified site: Secondary | ICD-10-CM | POA: Insufficient documentation

## 2023-01-17 DIAGNOSIS — M549 Dorsalgia, unspecified: Secondary | ICD-10-CM | POA: Insufficient documentation

## 2023-01-17 DIAGNOSIS — Z170 Estrogen receptor positive status [ER+]: Secondary | ICD-10-CM | POA: Diagnosis not present

## 2023-01-17 NOTE — Progress Notes (Signed)
 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  INTERVAL HISTORY: Patient returns to clinic today for further evaluation and discussion on whether or not to reinitiate treatment with Afinitor .  She recently had issues with constipation, but this is since resolved.  She also has increased pain, but attributes this to her increased physical activity and physical therapy.  She continues to have chronic weakness and fatigue.  She continues to have a mild peripheral neuropathy, but no other neurologic complaints.  She denies any chest pain, shortness of breath, cough, or hemoptysis.  She denies any nausea, vomiting, constipation, or diarrhea.  She has no urinary complaints.  Patient offers no further specific complaints today.  REVIEW OF SYSTEMS:   Review of Systems  Constitutional:  Positive for malaise/fatigue. Negative for fever and weight loss.  HENT:  Negative for congestion.   Respiratory: Negative.  Negative for cough and shortness of breath.   Cardiovascular: Negative.  Negative for chest pain and leg swelling.  Gastrointestinal: Negative.  Negative for abdominal pain, constipation, diarrhea and nausea.  Genitourinary: Negative.  Negative for dysuria, flank pain and urgency.  Musculoskeletal:  Positive for joint pain. Negative for back pain.  Skin: Negative.  Negative for rash.  Neurological:  Positive for tingling, sensory change and weakness.  Negative for dizziness, focal weakness and headaches.  Psychiatric/Behavioral:  The patient is not nervous/anxious.     As per HPI. Otherwise, a complete review of systems is negative.  PAST MEDICAL HISTORY: Past Medical History:  Diagnosis Date   Acute hypoxic respiratory failure (HCC) 11/26/2022   AKI (acute kidney injury) (HCC) 07/18/2022   Anxiety    Breast cancer, left (HCC) 07/2017   Depression    Electrolyte abnormality 07/18/2022   Elevated troponin 02/17/2015   Hypercalcemia 11/26/2022   Hypertension    Hypothyroidism    Personal history of chemotherapy 2019   LEFT mastectomy-chemo before   Personal history of radiation therapy 03/2018   LEFT mastectomy   Rapid heart rate    Thyroid  disease     PAST SURGICAL HISTORY: Past Surgical History:  Procedure Laterality Date   AXILLARY LYMPH NODE BIOPSY Left 07/16/2017   METASTATIC MAMMARY CARCINOMA   BREAST BIOPSY Left 07/16/2017   us  bx of left breast mass and left breast LN.  INVASIVE MAMMARY CARCINOMA, NO SPECIAL TYPE.    BREAST EXCISIONAL BIOPSY Right 2001   benign   BREAST LUMPECTOMY WITH SENTINEL LYMPH NODE BIOPSY Left 12/06/2017   Procedure: BREAST LUMPECTOMY WITH SENTINEL LYMPH NODE BX;  Surgeon: Dessa Reyes ORN, MD;  Location: [facility] ORS;  Service: General;  Laterality: Left;   COLONOSCOPY     MASTECTOMY Left 12/23/2017   PORTACATH PLACEMENT Right 08/07/2017   Procedure: INSERTION PORT-A-CATH;  Surgeon: Dessa Reyes ORN, MD;  Location: [facility] ORS;  Service: General;  Laterality: Right;   SIMPLE MASTECTOMY WITH AXILLARY SENTINEL NODE BIOPSY Left 12/23/2017   T2,N2 with 6/7 nodes positive. Whole breast radiation.  Surgeon: Dessa Reyes ORN, MD;  Location: [facility] ORS;  Service: General;  Laterality: Left;  TONSILLECTOMY      FAMILY HISTORY: Family History  Problem Relation Age of Onset   Stroke Mother    Thyroid  disease Mother    Renal Disease Mother    Stroke Father    Heart attack Father    Sudden death  Father 41       suicide   Anuerysm Brother    Breast cancer Neg Hx     ADVANCED DIRECTIVES (Y/N):  N  HEALTH MAINTENANCE: Social History   Tobacco Use   Smoking status: Former    Current packs/day: 0.00    Average packs/day: 1 pack/day for 30.0 years (30.0 ttl pk-yrs)    Types: Cigarettes    Start date: 9    Quit date: 2019    Years since quitting: 6.0   Smokeless tobacco: Never  Vaping Use   Vaping status: Never Used  Substance Use Topics   Alcohol use: Not Currently   Drug use: Never     Colonoscopy:  PAP:  Bone density:  Lipid panel:  Allergies  Allergen Reactions   Sulfa  Antibiotics Diarrhea    Current Outpatient Medications  Medication Sig Dispense Refill   acetaminophen  (TYLENOL ) 500 MG tablet Take 1,000 mg by mouth every 6 (six) hours as needed for moderate pain or headache.      ALPRAZolam  (XANAX ) 0.5 MG tablet TAKE 1 TABLET(0.5 MG) BY MOUTH TWICE DAILY AS NEEDED FOR ANXIETY 60 tablet 1   ascorbic acid  (VITAMIN C ) 500 MG tablet Take 500 mg by mouth daily.     aspirin  EC 81 MG tablet Take 81 mg by mouth at bedtime.      feeding supplement (ENSURE ENLIVE / ENSURE PLUS) LIQD Take 237 mLs by mouth 2 (two) times daily between meals.     levothyroxine  (SYNTHROID ) 100 MCG tablet Take 1 tablet (100 mcg total) by mouth daily before breakfast.     loratadine  (CLARITIN ) 10 MG tablet Take 10 mg by mouth daily. Take to reduce risk of rash from everolimus .     MAGNESIUM  CITRATE PO Take by mouth.     magnesium  oxide (MAG-OX) 400 (240 Mg) MG tablet Take 400 mg by mouth daily.     midodrine  (PROAMATINE ) 10 MG tablet Take 1 tablet (10 mg total) by mouth 3 (three) times daily with meals. 90 tablet 1   Multiple Vitamin (MULTIVITAMIN WITH MINERALS) TABS tablet Take 1 tablet by mouth daily.     ondansetron  (ZOFRAN ) 8 MG tablet Take 1 tablet (8 mg total) by mouth every 8 (eight) hours as needed. 30 tablet 1   ondansetron  (ZOFRAN ) 8 MG tablet TAKE 1 TABLET(8 MG) BY MOUTH EVERY 8  HOURS AS NEEDED FOR NAUSEA OR VOMITING 20 tablet 2   pantoprazole  (PROTONIX ) 20 MG tablet TAKE 1 TABLET(20 MG) BY MOUTH DAILY 90 tablet 0   potassium chloride  SA (KLOR-CON  M) 20 MEQ tablet Take 1 tablet (20 mEq total) by mouth 2 (two) times daily. 180 tablet 3   Probiotic Product (ALIGN) 4 MG CAPS Take 1 capsule by mouth daily.     prochlorperazine  (COMPAZINE ) 10 MG tablet TAKE 1 TABLET(10 MG) BY MOUTH EVERY 6 HOURS AS NEEDED FOR NAUSEA OR VOMITING 30 tablet 2   rosuvastatin  (CRESTOR ) 5 MG tablet Take 1 tablet (5 mg total) by mouth daily. 90 tablet 3   traMADol  (ULTRAM ) 50 MG tablet Take 1 tablet (50 mg total) by mouth every 6 (six) hours as needed. 60 tablet 0   traZODone  (DESYREL ) 50 MG tablet Take 1  tablet (50 mg total) by mouth at bedtime. 30 tablet 2   calcium  carbonate (OS-CAL - DOSED IN MG OF ELEMENTAL CALCIUM ) 1250 (500 Ca) MG tablet Take 1 tablet by mouth daily with breakfast. (Patient not taking: Reported on 12/06/2022)     Cholecalciferol  (VITAMIN D3) 125 MCG (5000 UT) CAPS Take 5,000 Units by mouth 2 (two) times a day. (Patient not taking: Reported on 12/06/2022)     No current facility-administered medications for this visit.    OBJECTIVE: Vitals:   01/17/23 0942  BP: 118/78  Pulse: 86  Resp: 16  Temp: 98.9 F (37.2 C)  SpO2: 95%      Body mass index is 28.73 kg/m.    ECOG FS:1 - Symptomatic but completely ambulatory  General: Well-developed, well-nourished, no acute distress.  Sitting in a wheelchair. Eyes: Pink conjunctiva, anicteric sclera. HEENT: Normocephalic, moist mucous membranes. Lungs: No audible wheezing or coughing. Heart: Regular rate and rhythm. Abdomen: Soft, nontender, no obvious distention. Musculoskeletal: No edema, cyanosis, or clubbing. Neuro: Alert, answering all questions appropriately. Cranial nerves grossly intact. Skin: No rashes or petechiae noted. Psych: Normal affect.   LAB RESULTS:  Lab Results  Component Value Date   NA 135  01/14/2023   K 4.5 01/14/2023   CL 100 01/14/2023   CO2 24 01/14/2023   GLUCOSE 105 (H) 01/14/2023   BUN 16 01/14/2023   CREATININE 1.31 (H) 01/14/2023   CALCIUM  9.3 01/14/2023   PROT 6.7 01/14/2023   ALBUMIN  3.5 01/14/2023   AST 24 01/14/2023   ALT 16 01/14/2023   ALKPHOS 163 (H) 01/14/2023   BILITOT 0.4 01/14/2023   GFRNONAA 43 (L) 01/14/2023   GFRAA 57 (L) 10/01/2019    Lab Results  Component Value Date   WBC 6.1 01/14/2023   NEUTROABS 4.9 01/14/2023   HGB 9.5 (L) 01/14/2023   HCT 30.0 (L) 01/14/2023   MCV 90.9 01/14/2023   PLT 378 01/14/2023     STUDIES: No results found.   ONCOLOGY HISTORY: Patient initially received neoadjuvant chemotherapy with Adriamycin  and Cytoxan  followed by weekly Taxol . She only received 4 cycles of weekly Taxol  prior to discontinuation of treatment on October 31, 2017 secondary to persistent peripheral neuropathy.  She ultimately required mastectomy and final pathology noted 5 of 6 lymph nodes positive for disease.  Patient completed adjuvant XRT in mid April 2020.  Pain initiated letrozole  in May 2020, this was discontinued secondary to side effects and patient was started on anastrozole .  Nuclear medicine bone scan on June 19, 2019 revealed metastatic disease and anastrozole  was subsequently discontinued.  PET scan results from October 02, 2021 revealed no clear evidence of metastatic progression.  PET scan results from June 30, 2019 reviewed independently with metastatic bony disease, but no obvious evidence of visceral disease.  MRI of the brain on August 24, 2019 reviewed independently with no obvious evidence of metastatic disease.   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  Patient's CA 27-29 continues to slowly increase and now is 243.2.  Patient has  not received any treatment other than Aromasin  in nearly 2 months and treatment over the last 6 months has been intermittent at best.  Xgeva  has been permanently discontinued given her issues with her mandible.  Patient wishes to continue to hold treatment for another month as she continues to gain strength.  Return to clinic in 4 weeks for repeat laboratory, further evaluation, and reinitiation of Afinitor . Appreciate palliative care input.   Constipation: Resolved.  Patient has been instructed she needs a regular daily bowel regimen. Peripheral neuropathy: Chronic and unchanged. History of pulmonary embolus: Patient was diagnosed with a small pulmonary embolus on January 10, 2018.  She is no longer on anticoagulation.  There was no evidence of recurrent PE on CT scan from October 27, 2019. Osteopenia: Patient's most recent bone mineral density on March 03, 2019 reported a T score of -1.8 which is only mildly decreased from 1 year prior when the reported T score was -1.6.  Continue calcium  and vitamin D  supplementation.  Patient no longer will receive bisphosphonates.   Left hip/flank pain: Patient states this is slightly worse, but attributes it to increased physical therapy and activity.  Continue tramadol  as prescribed.  She has now completed XRT.   Anemia: Hemoglobin improved to 9.5.   Renal insufficiency: Chronic and unchanged.  Patient's creatinine is 1.31 today.   Hypokalemia: Resolved.  Continue oral potassium supplementation. Dental pathology: Significantly improved.  Continue follow up with Dr. Viviana.  Patient has discontinued hyperbaric treatments at [facility] Lymphedema: Follow-up with occupational therapy as scheduled. Coping/anxiety: Continue trazodone  at night.  Patient was also given a referral to [facility].   Patient expressed understanding and was in agreement with this plan. She also understands that She can call clinic at any time with any questions, concerns, or  complaints.    Cancer Staging  Breast cancer, stage 2, left (HCC) Staging form: Breast, AJCC 8th Edition - Clinical stage from 07/30/2017: Stage IIA (cT2, cN1, cM0, G2, ER+, PR+, HER2-) - Signed by Jacobo Evalene PARAS, MD on 07/30/2017 Histologic grading system: 3 grade system Laterality: Left   Evalene PARAS Jacobo, MD   01/17/2023 11:00 AM

## 2023-01-17 NOTE — Progress Notes (Signed)
 States she has been having constipation. Has tried OTC stool softeners and miralax. Has an rx for linzess as well but states that it causes her to have diarrhea.

## 2023-01-17 NOTE — Progress Notes (Signed)
 Palliative Medicine [facility] at [facility] [phone]) [phone] [phone]   Name: Alicia [name] Date: 01/17/2023 MRN: [id number]  DOB: Sep 27, 1950  Patient Care Team: Britta King, [name], Lonni, [name] Cindie Jesusa CHRISTELLA, RN as Registered Nurse Jacobo Evalene PARAS, MD as Consulting Physician (Oncology) Lenn Aran, MD as Referring Physician (Radiation Oncology) Tama Mems, RN as Registered Nurse    REASON FOR CONSULTATION: Alicia [name] is a 73 y.o. 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  Patient with overall poor tolerance to cancer treatment and with progressive bony metastasis demonstrated on CT. Palliative care was consulted to address goals  SOCIAL HISTORY:     reports that she quit smoking about 6 years ago. Her smoking use included cigarettes. She started smoking about 36 years ago. She has a 30 pack-year smoking history. She has never used smokeless tobacco. She reports that she does not currently use alcohol. She reports that she does not use drugs.  Patient is unmarried.  She has a son with whom she lives.  ADVANCE DIRECTIVES:    CODE STATUS:   PAST MEDICAL HISTORY: Past Medical History:  Diagnosis Date   Acute hypoxic respiratory failure (HCC) 11/26/2022   AKI (acute kidney injury) (HCC) 07/18/2022   Anxiety    Breast cancer, left (HCC) 07/2017   Depression    Electrolyte abnormality 07/18/2022   Elevated troponin 02/17/2015   Hypercalcemia 11/26/2022   Hypertension    Hypothyroidism    Personal history of chemotherapy 2019   LEFT mastectomy-chemo before   Personal history of radiation therapy 03/2018   LEFT mastectomy   Rapid heart rate    Thyroid  disease     PAST SURGICAL HISTORY:  Past Surgical History:  Procedure Laterality  Date   AXILLARY LYMPH NODE BIOPSY Left 07/16/2017   METASTATIC MAMMARY CARCINOMA   BREAST BIOPSY Left 07/16/2017   us  bx of left breast mass and left breast LN.  INVASIVE MAMMARY CARCINOMA, NO SPECIAL TYPE.    BREAST EXCISIONAL BIOPSY Right 2001   benign   BREAST LUMPECTOMY WITH SENTINEL LYMPH NODE BIOPSY Left 12/06/2017   Procedure: BREAST LUMPECTOMY WITH SENTINEL LYMPH NODE BX;  Surgeon: Dessa Reyes ORN, MD;  Location: [facility] ORS;  Service: General;  Laterality: Left;   COLONOSCOPY     MASTECTOMY Left 12/23/2017   PORTACATH PLACEMENT Right 08/07/2017   Procedure: INSERTION PORT-A-CATH;  Surgeon: Dessa Reyes ORN, MD;  Location: [facility] ORS;  Service: General;  Laterality: Right;   SIMPLE MASTECTOMY WITH AXILLARY SENTINEL NODE BIOPSY Left 12/23/2017   T2,N2 with 6/7 nodes positive. Whole breast radiation.  Surgeon: Dessa Reyes ORN, MD;  Location: [facility] ORS;  Service: General;  Laterality: Left;   TONSILLECTOMY      HEMATOLOGY/ONCOLOGY HISTORY:  Oncology History Overview Note  Patient is a 73 year old female who recently self palpated a mass on her left breast.  Subsequent imaging and biopsy revealed the above-stated breast cancer.  Case was also discussed extensively at case conference.  Given the size and the stage of patient's malignancy, she will benefit from neoadjuvant chemotherapy using Adriamycin , Cytoxan , and Taxol .  Patient will also require Neulasta  support.  Will get CT scan of the chest, abdomen, and pelvis to assess for any metastatic disease.  Patient will also require port placement and MUGA prior to initiating treatment  CT abdomen/pelvis/chest did not  reveal any suspicious lesions concerning for metastatic disease. (08/01/17)  Port-A-Cath placed on 08/07/2017.  Cycle 1 day 1 of AC was given on 08/08/17.     Breast cancer, stage 2, left (HCC)  07/24/2017 Initial Diagnosis   Breast cancer, stage 2, left (HCC)   07/30/2017 Cancer Staging   Staging form: Breast, AJCC 8th  Edition - Clinical stage from 07/30/2017: Stage IIA (cT2, cN1, cM0, G2, ER+, PR+, HER2-) - Signed by Jacobo Evalene PARAS, MD on 07/30/2017   08/08/2017 - 10/31/2017 Chemotherapy   The patient had DOXOrubicin  (ADRIAMYCIN ) chemo injection 130 mg, 60 mg/m2 = 130 mg, Intravenous,  Once, 4 of 4 cycles Administration: 130 mg (08/08/2017), 130 mg (08/22/2017), 130 mg (09/05/2017), 130 mg (09/19/2017) palonosetron  (ALOXI ) injection 0.25 mg, 0.25 mg, Intravenous,  Once, 4 of 4 cycles Administration: 0.25 mg (08/08/2017), 0.25 mg (08/22/2017), 0.25 mg (09/05/2017), 0.25 mg (09/19/2017) pegfilgrastim -cbqv (UDENYCA ) injection 6 mg, 6 mg, Subcutaneous, Once, 4 of 4 cycles Administration: 6 mg (08/09/2017), 6 mg (08/23/2017), 6 mg (09/06/2017), 6 mg (09/20/2017) cyclophosphamide  (CYTOXAN ) 1,300 mg in sodium chloride  0.9 % 250 mL chemo infusion, 600 mg/m2 = 1,300 mg, Intravenous,  Once, 4 of 4 cycles Administration: 1,300 mg (08/08/2017), 1,300 mg (08/22/2017), 1,300 mg (09/05/2017), 1,300 mg (09/19/2017) PACLitaxel  (TAXOL ) 174 mg in sodium chloride  0.9 % 250 mL chemo infusion (</= 80mg /m2), 80 mg/m2 = 174 mg, Intravenous,  Once, 4 of 12 cycles Dose modification: 72 mg/m2 (original dose 80 mg/m2, Cycle 6, Reason: Dose not tolerated) Administration: 174 mg (10/03/2017), 156 mg (10/17/2017), 156 mg (10/24/2017), 156 mg (10/31/2017) fosaprepitant  (EMEND ) 150 mg, dexamethasone  (DECADRON ) 12 mg in sodium chloride  0.9 % 145 mL IVPB, , Intravenous,  Once, 4 of 4 cycles Administration:  (08/08/2017),  (08/22/2017),  (09/05/2017),  (09/19/2017)  for chemotherapy treatment.      ALLERGIES:  is allergic to sulfa  antibiotics.  MEDICATIONS:  Current Outpatient Medications  Medication Sig Dispense Refill   acetaminophen  (TYLENOL ) 500 MG tablet Take 1,000 mg by mouth every 6 (six) hours as needed for moderate pain or headache.      ALPRAZolam  (XANAX ) 0.5 MG tablet TAKE 1 TABLET(0.5 MG) BY MOUTH TWICE DAILY AS NEEDED FOR ANXIETY 60 tablet 1   ascorbic  acid (VITAMIN C ) 500 MG tablet Take 500 mg by mouth daily.     aspirin  EC 81 MG tablet Take 81 mg by mouth at bedtime.      feeding supplement (ENSURE ENLIVE / ENSURE PLUS) LIQD Take 237 mLs by mouth 2 (two) times daily between meals.     levothyroxine  (SYNTHROID ) 100 MCG tablet Take 1 tablet (100 mcg total) by mouth daily before breakfast.     loratadine  (CLARITIN ) 10 MG tablet Take 10 mg by mouth daily. Take to reduce risk of rash from everolimus .     MAGNESIUM  CITRATE PO Take by mouth.     magnesium  oxide (MAG-OX) 400 (240 Mg) MG tablet Take 400 mg by mouth daily.     midodrine  (PROAMATINE ) 10 MG tablet Take 1 tablet (10 mg total) by mouth 3 (three) times daily with meals. 90 tablet 1   Multiple Vitamin (MULTIVITAMIN WITH MINERALS) TABS tablet Take 1 tablet by mouth daily.     ondansetron  (ZOFRAN ) 8 MG tablet Take 1 tablet (8 mg total) by mouth every 8 (eight) hours as needed. 30 tablet 1   ondansetron  (ZOFRAN ) 8 MG tablet TAKE 1 TABLET(8 MG) BY MOUTH EVERY 8 HOURS AS NEEDED FOR NAUSEA OR VOMITING 20 tablet 2  pantoprazole  (PROTONIX ) 20 MG tablet TAKE 1 TABLET(20 MG) BY MOUTH DAILY 90 tablet 0   potassium chloride  SA (KLOR-CON  M) 20 MEQ tablet Take 1 tablet (20 mEq total) by mouth 2 (two) times daily. 180 tablet 3   Probiotic Product (ALIGN) 4 MG CAPS Take 1 capsule by mouth daily.     prochlorperazine  (COMPAZINE ) 10 MG tablet TAKE 1 TABLET(10 MG) BY MOUTH EVERY 6 HOURS AS NEEDED FOR NAUSEA OR VOMITING 30 tablet 2   rosuvastatin  (CRESTOR ) 5 MG tablet Take 1 tablet (5 mg total) by mouth daily. 90 tablet 3   traMADol  (ULTRAM ) 50 MG tablet Take 1 tablet (50 mg total) by mouth every 6 (six) hours as needed. 60 tablet 0   traZODone  (DESYREL ) 50 MG tablet Take 1 tablet (50 mg total) by mouth at bedtime. 30 tablet 2   calcium  carbonate (OS-CAL - DOSED IN MG OF ELEMENTAL CALCIUM ) 1250 (500 Ca) MG tablet Take 1 tablet by mouth daily with breakfast. (Patient not taking: Reported on 12/06/2022)      Cholecalciferol  (VITAMIN D3) 125 MCG (5000 UT) CAPS Take 5,000 Units by mouth 2 (two) times a day. (Patient not taking: Reported on 12/06/2022)     No current facility-administered medications for this visit.    VITAL SIGNS: BP 118/78 (BP Location: Right Arm, Patient Position: Sitting, Cuff Size: Normal)   Pulse 86   Temp 98.9 F (37.2 C) (Tympanic)   Resp 16   Ht 5' 6 (1.676 m)   Wt 178 lb (80.7 kg)   SpO2 95%   BMI 28.73 kg/m  Filed Weights   01/17/23 0942  Weight: 178 lb (80.7 kg)    Estimated body mass index is 28.73 kg/m as calculated from the following:   Height as of this encounter: 5' 6 (1.676 m).   Weight as of this encounter: 178 lb (80.7 kg).  LABS: CBC:    Component Value Date/Time   WBC 6.1 01/14/2023 1100   HGB 9.5 (L) 01/14/2023 1100   HGB 10.5 (L) 10/08/2022 0902   HGB 14.2 02/11/2011 1349   HCT 30.0 (L) 01/14/2023 1100   HCT 40.9 02/11/2011 1349   PLT 378 01/14/2023 1100   PLT 249 10/08/2022 0902   PLT 151 02/11/2011 1349   MCV 90.9 01/14/2023 1100   MCV 89 02/11/2011 1349   NEUTROABS 4.9 01/14/2023 1100   LYMPHSABS 0.2 (L) 01/14/2023 1100   MONOABS 0.7 01/14/2023 1100   EOSABS 0.2 01/14/2023 1100   BASOSABS 0.0 01/14/2023 1100   Comprehensive Metabolic Panel:    Component Value Date/Time   NA 135 01/14/2023 1100   NA 142 02/11/2011 1349   K 4.5 01/14/2023 1100   K 3.8 02/11/2011 1349   CL 100 01/14/2023 1100   CL 107 02/11/2011 1349   CO2 24 01/14/2023 1100   CO2 24 02/11/2011 1349   BUN 16 01/14/2023 1100   BUN 14 02/11/2011 1349   CREATININE 1.31 (H) 01/14/2023 1100   CREATININE 0.65 02/11/2011 1349   GLUCOSE 105 (H) 01/14/2023 1100   GLUCOSE 98 02/11/2011 1349   CALCIUM  9.3 01/14/2023 1100   CALCIUM  9.0 02/11/2011 1349   AST 24 01/14/2023 1100   ALT 16 01/14/2023 1100   ALT 36 02/11/2011 1349   ALKPHOS 163 (H) 01/14/2023 1100   ALKPHOS 122 02/11/2011 1349   BILITOT 0.4 01/14/2023 1100   PROT 6.7 01/14/2023 1100   PROT 7.0  02/11/2011 1349   ALBUMIN  3.5 01/14/2023 1100   ALBUMIN  4.1 02/11/2011  1349    RADIOGRAPHIC STUDIES: No results found.  PERFORMANCE STATUS (ECOG) : 2 - Symptomatic, <50% confined to bed  Review of Systems Unless otherwise noted, a complete review of systems is negative.  Physical Exam General: NAD Pulmonary: Unlabored Extremities: no edema, no joint deformities Skin: no rashes Neurological: Weakness but otherwise nonfocal  IMPRESSION: Patient returns for follow-up.  Overall, she feels improved off chemotherapy and would like to continue holding treatment for least another month.  Symptomatically, patient endorses pain and constipation.  She has lessened utilization of tramadol  but is not consistently taking a bowel regimen.  Discussed bowel regimen in detail.  Patient has MOST form at home but states that she has not discussed to review with family.  PLAN: -Continue current scope of treatment -MOST form previously reviewed -Follow-up in 1 month  Case and plan discussed with Dr. Jacobo  Patient expressed understanding and was in agreement with this plan. She also understands that She can call the clinic at any time with any questions, concerns, or complaints.     Time Total: 15 minutes  Visit consisted of counseling and education dealing with the complex and emotionally intense issues of symptom management and palliative care in the setting of serious and potentially life-threatening illness.Greater than 50%  of this time was spent counseling and coordinating care related to the above assessment and plan.  Signed by: Fonda Mower, PhD, NP-C

## 2023-01-22 ENCOUNTER — Inpatient Hospital Stay: Payer: Medicare Other

## 2023-01-23 ENCOUNTER — Encounter: Payer: Self-pay | Admitting: Oncology

## 2023-02-06 NOTE — Progress Notes (Deleted)
  Cardiology Office Note:  .   Date:  02/06/2023  ID:  Alicia [name], DOB 1950/02/01, MRN [id number] PCP: Corky Downs, MD  [facility] Providers Cardiologist:  Yvonne Kendall, MD { Click to update primary MD,subspecialty MD or APP then REFRESH:1}    History of Present Illness: .   Alicia [name] is a 73 y.o. female with history of remote pulmonary embolism, aortic atherosclerosis, hypertension, left sided breast cancer status postmastectomy and chemotherapy with recurrent osseous metastases, thyroid disease, and anxiety, who presents for follow-up of shortness of breath.  She was last seen in the office in 10/2022 by Lavonna Rua hammock, NP, at which time she was doing okay.  She still noted some lower extremity edema.  Restricting her fluid made her feel extremely dry.  She also has some chronic left upper extremity edema related to lymphedema.  She denied chest pain and shortness of breath.  Preceding echo showed normal LVEF with grade 1 diastolic dysfunction and mild aortic regurgitation.  No further testing or medication changes were pursued.  ROS: See HPI  Studies Reviewed: .        *** Risk Assessment/Calculations:   {Does this patient have ATRIAL FIBRILLATION?:[phone]} No BP recorded.  {Refresh Note OR Click here to enter BP  :1}***       Physical Exam:   VS:  There were no vitals taken for this visit.   Wt Readings from Last 3 Encounters:  01/17/23 178 lb (80.7 kg)  01/17/23 178 lb (80.7 kg)  12/06/22 173 lb (78.5 kg)    General:  NAD. Neck: No JVD or HJR. Lungs: Clear to auscultation bilaterally without wheezes or crackles. Heart: Regular rate and rhythm without murmurs, rubs, or gallops. Abdomen: Soft, nontender, nondistended. Extremities: No lower extremity edema.  ASSESSMENT AND PLAN: .    ***    {Are you ordering a CV Procedure (e.g. stress test, cath, DCCV, TEE, etc)?   Press F2        :[id number]}  Dispo: ***  Signed, Yvonne Kendall, MD

## 2023-02-07 ENCOUNTER — Ambulatory Visit: Admitting: Internal Medicine

## 2023-02-12 ENCOUNTER — Telehealth: Payer: Self-pay | Admitting: *Deleted

## 2023-02-12 ENCOUNTER — Other Ambulatory Visit: Payer: Self-pay | Admitting: *Deleted

## 2023-02-12 DIAGNOSIS — R350 Frequency of micturition: Secondary | ICD-10-CM

## 2023-02-12 NOTE — Telephone Encounter (Signed)
Pt. Alicia [name] that she may have a UTI, she states that she has frequent urination and the urine color has changed. I spoke to Prosper and he is ok with UA and urine culture. She will get then with her labs tom.

## 2023-02-13 ENCOUNTER — Other Ambulatory Visit: Payer: Self-pay | Admitting: *Deleted

## 2023-02-13 ENCOUNTER — Inpatient Hospital Stay: Payer: Medicare Other

## 2023-02-13 ENCOUNTER — Telehealth: Payer: Self-pay | Admitting: *Deleted

## 2023-02-13 DIAGNOSIS — C50912 Malignant neoplasm of unspecified site of left female breast: Secondary | ICD-10-CM

## 2023-02-13 DIAGNOSIS — R350 Frequency of micturition: Secondary | ICD-10-CM

## 2023-02-13 DIAGNOSIS — C7951 Secondary malignant neoplasm of bone: Secondary | ICD-10-CM | POA: Diagnosis not present

## 2023-02-13 DIAGNOSIS — Z170 Estrogen receptor positive status [ER+]: Secondary | ICD-10-CM | POA: Diagnosis not present

## 2023-02-13 DIAGNOSIS — M8580 Other specified disorders of bone density and structure, unspecified site: Secondary | ICD-10-CM | POA: Diagnosis not present

## 2023-02-13 DIAGNOSIS — D649 Anemia, unspecified: Secondary | ICD-10-CM | POA: Diagnosis not present

## 2023-02-13 DIAGNOSIS — N189 Chronic kidney disease, unspecified: Secondary | ICD-10-CM | POA: Diagnosis not present

## 2023-02-13 LAB — CMP (CANCER CENTER ONLY)
ALT: 33 U/L (ref 0–44)
AST: 39 U/L (ref 15–41)
Albumin: 3.7 g/dL (ref 3.5–5.0)
Alkaline Phosphatase: 171 U/L — ABNORMAL HIGH (ref 38–126)
Anion gap: 9 (ref 5–15)
BUN: 21 mg/dL (ref 8–23)
CO2: 23 mmol/L (ref 22–32)
Calcium: 9.7 mg/dL (ref 8.9–10.3)
Chloride: 103 mmol/L (ref 98–111)
Creatinine: 1.16 mg/dL — ABNORMAL HIGH (ref 0.44–1.00)
GFR, Estimated: 50 mL/min — ABNORMAL LOW (ref >60)
Glucose, Bld: 95 mg/dL (ref 70–99)
Potassium: 4.1 mmol/L (ref 3.5–5.1)
Sodium: 135 mmol/L (ref 135–145)
Total Bilirubin: 0.4 mg/dL (ref 0.0–1.2)
Total Protein: 7 g/dL (ref 6.5–8.1)

## 2023-02-13 LAB — CBC WITH DIFFERENTIAL/PLATELET
Abs Immature Granulocytes: 0.03 10*3/uL (ref 0.00–0.07)
Basophils Absolute: 0 10*3/uL (ref 0.0–0.1)
Basophils Relative: 1 %
Eosinophils Absolute: 0.1 10*3/uL (ref 0.0–0.5)
Eosinophils Relative: 3 %
HCT: 35.4 % — ABNORMAL LOW (ref 36.0–46.0)
Hemoglobin: 11.4 g/dL — ABNORMAL LOW (ref 12.0–15.0)
Immature Granulocytes: 1 %
Lymphocytes Relative: 10 %
Lymphs Abs: 0.5 10*3/uL — ABNORMAL LOW (ref 0.7–4.0)
MCH: 28.6 pg (ref 26.0–34.0)
MCHC: 32.2 g/dL (ref 30.0–36.0)
MCV: 88.7 fL (ref 80.0–100.0)
Monocytes Absolute: 0.5 10*3/uL (ref 0.1–1.0)
Monocytes Relative: 12 %
Neutro Abs: 3.3 10*3/uL (ref 1.7–7.7)
Neutrophils Relative %: 73 %
Platelets: 249 10*3/uL (ref 150–400)
RBC: 3.99 MIL/uL (ref 3.87–5.11)
RDW: 15.3 % (ref 11.5–15.5)
WBC: 4.4 10*3/uL (ref 4.0–10.5)
nRBC: 0 % (ref 0.0–0.2)

## 2023-02-13 LAB — URINALYSIS, COMPLETE (UACMP) WITH MICROSCOPIC
Bilirubin Urine: NEGATIVE
Glucose, UA: NEGATIVE mg/dL
Hgb urine dipstick: NEGATIVE
Ketones, ur: NEGATIVE mg/dL
Nitrite: NEGATIVE
Protein, ur: 30 mg/dL — AB
Specific Gravity, Urine: 1.023 (ref 1.005–1.030)
WBC, UA: >50 WBC/hpf (ref 0–5)
pH: 5 (ref 5.0–8.0)

## 2023-02-13 MED ORDER — CIPROFLOXACIN HCL 250 MG PO TABS: 250.0000 mg | ORAL_TABLET | Freq: Two times a day (BID) | ORAL | 0 refills | Status: DC

## 2023-02-13 NOTE — Telephone Encounter (Signed)
Message received from MD that patients UA shows urinary tract infection, order to call in Cipro 250 mg BID for 7 days. Left vm for patient regarding results and need for antibiotics. Prescription sent to patients pharmacy. Patient has follow up visit scheduled in clinic tomorrow.

## 2023-02-13 NOTE — Progress Notes (Signed)
error

## 2023-02-14 ENCOUNTER — Other Ambulatory Visit: Payer: Self-pay | Admitting: *Deleted

## 2023-02-14 ENCOUNTER — Encounter: Payer: Self-pay | Admitting: Oncology

## 2023-02-14 ENCOUNTER — Inpatient Hospital Stay (HOSPITAL_BASED_OUTPATIENT_CLINIC_OR_DEPARTMENT_OTHER): Payer: Medicare Other | Admitting: Hospice and Palliative Medicine

## 2023-02-14 ENCOUNTER — Inpatient Hospital Stay: Payer: Medicare Other | Admitting: Oncology

## 2023-02-14 ENCOUNTER — Inpatient Hospital Stay: Payer: Medicare Other | Admitting: Pharmacist

## 2023-02-14 VITALS — BP 104/65 | HR 100 | Temp 98.6°F | Resp 16 | Ht 66.0 in | Wt 168.6 lb

## 2023-02-14 DIAGNOSIS — C50912 Malignant neoplasm of unspecified site of left female breast: Secondary | ICD-10-CM | POA: Diagnosis not present

## 2023-02-14 DIAGNOSIS — Z515 Encounter for palliative care: Secondary | ICD-10-CM

## 2023-02-14 DIAGNOSIS — N189 Chronic kidney disease, unspecified: Secondary | ICD-10-CM | POA: Diagnosis not present

## 2023-02-14 DIAGNOSIS — M545 Low back pain, unspecified: Secondary | ICD-10-CM

## 2023-02-14 DIAGNOSIS — Z170 Estrogen receptor positive status [ER+]: Secondary | ICD-10-CM | POA: Diagnosis not present

## 2023-02-14 DIAGNOSIS — F419 Anxiety disorder, unspecified: Secondary | ICD-10-CM

## 2023-02-14 DIAGNOSIS — C7951 Secondary malignant neoplasm of bone: Secondary | ICD-10-CM | POA: Diagnosis not present

## 2023-02-14 DIAGNOSIS — D649 Anemia, unspecified: Secondary | ICD-10-CM | POA: Diagnosis not present

## 2023-02-14 DIAGNOSIS — M8580 Other specified disorders of bone density and structure, unspecified site: Secondary | ICD-10-CM | POA: Diagnosis not present

## 2023-02-14 LAB — CANCER ANTIGEN 27.29: CA 27.29: 324.3 U/mL — ABNORMAL HIGH (ref 0.0–38.6)

## 2023-02-14 MED ORDER — EXEMESTANE 25 MG PO TABS: 25.0000 mg | ORAL_TABLET | Freq: Every day | ORAL | 3 refills | Status: DC

## 2023-02-14 MED ORDER — ALPRAZOLAM 0.5 MG PO TABS: 0.5000 mg | ORAL_TABLET | Freq: Two times a day (BID) | ORAL | 1 refills | Status: DC | PRN

## 2023-02-14 NOTE — Progress Notes (Signed)
Palliative Medicine [facility] at [facility] [phone] [phone]   Name: Alicia [name] Date: 02/14/2023 MRN: [id number]  DOB: [birth year]  [name], [name], [name], [name] Jim Like, RN as Registered Nurse Jeralyn Ruths, MD as Consulting Physician (Oncology) Carmina Miller, MD as Referring Physician (Radiation Oncology) Chriss Driver, RN as Registered Nurse    REASON FOR CONSULTATION: Alicia [name] is a 73 y.o. 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  Patient with overall poor tolerance to cancer treatment and with progressive bony metastasis demonstrated on CT. Palliative care was consulted to address goals  SOCIAL HISTORY:     reports that she quit smoking about 6 years ago. Her smoking use included cigarettes. She started smoking about 36 years ago. She has a 30 pack-year smoking history. She has never used smokeless tobacco. She reports that she does not currently use alcohol. She reports that she does not use drugs.  Patient is unmarried.  She has a son with whom she lives.  ADVANCE DIRECTIVES:  Does not have  CODE STATUS: Limited code (no CPR, okay with short-term intubation, MOST Form completed 02/14/2023)  PAST MEDICAL HISTORY: Past Medical History:  Diagnosis Date   Acute hypoxic respiratory failure (HCC) 11/26/2022   AKI (acute kidney injury) (HCC) 07/18/2022   Anxiety    Breast cancer, left (HCC) 07/2017   Depression    Electrolyte abnormality 07/18/2022   Elevated troponin 02/17/2015   Hypercalcemia 11/26/2022   Hypertension    Hypothyroidism    Personal history of chemotherapy 2019   LEFT mastectomy-chemo before   Personal history of radiation therapy 03/2018   LEFT mastectomy   Rapid heart rate     Thyroid disease     PAST SURGICAL HISTORY:  Past Surgical History:  Procedure Laterality Date   AXILLARY LYMPH NODE BIOPSY Left 07/16/2017   METASTATIC MAMMARY CARCINOMA   BREAST BIOPSY Left 07/16/2017   [place] bx of left breast mass and left breast LN.  INVASIVE MAMMARY CARCINOMA, NO SPECIAL TYPE.    BREAST EXCISIONAL BIOPSY Right 2001   benign   BREAST LUMPECTOMY WITH SENTINEL LYMPH NODE BIOPSY Left 12/06/2017   Procedure: BREAST LUMPECTOMY WITH SENTINEL LYMPH NODE BX;  Surgeon: Earline Mayotte, MD;  Location: [facility] ORS;  Service: General;  Laterality: Left;   COLONOSCOPY     MASTECTOMY Left 12/23/2017   PORTACATH PLACEMENT Right 08/07/2017   Procedure: INSERTION PORT-A-CATH;  Surgeon: Earline Mayotte, MD;  Location: [facility] ORS;  Service: General;  Laterality: Right;   SIMPLE MASTECTOMY WITH AXILLARY SENTINEL NODE BIOPSY Left 12/23/2017   T2,N2 with 6/7 nodes positive. Whole breast radiation.  Surgeon: Earline Mayotte, MD;  Location: [facility] ORS;  Service: General;  Laterality: Left;   TONSILLECTOMY      HEMATOLOGY/ONCOLOGY HISTORY:  Oncology History Overview Note  Patient is a 73 year old female who recently self palpated a mass on her left breast.  Subsequent imaging and biopsy revealed the above-stated breast cancer.  Case was also discussed extensively at case conference.  Given the size and the stage of patient's malignancy, she will benefit from neoadjuvant chemotherapy using Adriamycin, Cytoxan, and Taxol.  Patient will also require Neulasta support.  Will get CT scan of the chest, abdomen, and pelvis to assess for any metastatic disease.  Patient will also require  port placement and MUGA prior to initiating treatment  CT abdomen/pelvis/chest did not reveal any suspicious lesions concerning for metastatic disease. (08/01/17)  Port-A-Cath placed on 08/07/2017.  Cycle 1 day 1 of AC was given on 08/08/17.     Breast cancer, stage 2, left (HCC)  07/24/2017 Initial Diagnosis    Breast cancer, stage 2, left (HCC)   07/30/2017 Cancer Staging   Staging form: Breast, AJCC 8th Edition - Clinical stage from 07/30/2017: Stage IIA (cT2, cN1, cM0, G2, ER+, PR+, HER2-) - Signed by Jeralyn Ruths, MD on 07/30/2017   08/08/2017 - 10/31/2017 Chemotherapy   The patient had DOXOrubicin (ADRIAMYCIN) chemo injection 130 mg, 60 mg/m2 = 130 mg, Intravenous,  Once, 4 of 4 cycles Administration: 130 mg (08/08/2017), 130 mg (08/22/2017), 130 mg (09/05/2017), 130 mg (09/19/2017) palonosetron (ALOXI) injection 0.25 mg, 0.25 mg, Intravenous,  Once, 4 of 4 cycles Administration: 0.25 mg (08/08/2017), 0.25 mg (08/22/2017), 0.25 mg (09/05/2017), 0.25 mg (09/19/2017) pegfilgrastim-cbqv (UDENYCA) injection 6 mg, 6 mg, Subcutaneous, Once, 4 of 4 cycles Administration: 6 mg (08/09/2017), 6 mg (08/23/2017), 6 mg (09/06/2017), 6 mg (09/20/2017) cyclophosphamide (CYTOXAN) 1,300 mg in sodium chloride 0.9 % 250 mL chemo infusion, 600 mg/m2 = 1,300 mg, Intravenous,  Once, 4 of 4 cycles Administration: 1,300 mg (08/08/2017), 1,300 mg (08/22/2017), 1,300 mg (09/05/2017), 1,300 mg (09/19/2017) PACLitaxel (TAXOL) 174 mg in sodium chloride 0.9 % 250 mL chemo infusion (</= 80mg /m2), 80 mg/m2 = 174 mg, Intravenous,  Once, 4 of 12 cycles Dose modification: 72 mg/m2 (original dose 80 mg/m2, Cycle 6, Reason: Dose not tolerated) Administration: 174 mg (10/03/2017), 156 mg (10/17/2017), 156 mg (10/24/2017), 156 mg (10/31/2017) fosaprepitant (EMEND) 150 mg, dexamethasone (DECADRON) 12 mg in sodium chloride 0.9 % 145 mL IVPB, , Intravenous,  Once, 4 of 4 cycles Administration:  (08/08/2017),  (08/22/2017),  (09/05/2017),  (09/19/2017)  for chemotherapy treatment.      ALLERGIES:  is allergic to sulfa antibiotics.  MEDICATIONS:  Current Outpatient Medications  Medication Sig Dispense Refill   acetaminophen (TYLENOL) 500 MG tablet Take 1,000 mg by mouth every 6 (six) hours as needed for moderate pain or headache.      ALPRAZolam (XANAX) 0.5 MG  tablet TAKE 1 TABLET(0.5 MG) BY MOUTH TWICE DAILY AS NEEDED FOR ANXIETY 60 tablet 1   amLODipine (NORVASC) 5 MG tablet Take 5 mg by mouth daily. (Patient not taking: Reported on 02/14/2023)     ascorbic acid (VITAMIN C) 500 MG tablet Take 500 mg by mouth daily.     aspirin EC 81 MG tablet Take 81 mg by mouth at bedtime.      calcium carbonate (OS-CAL - DOSED IN MG OF ELEMENTAL CALCIUM) 1250 (500 Ca) MG tablet Take 1 tablet by mouth daily with breakfast. (Patient not taking: Reported on 12/06/2022)     Cholecalciferol (VITAMIN D3) 125 MCG (5000 UT) CAPS Take 5,000 Units by mouth 2 (two) times a day.     ciprofloxacin (CIPRO) 250 MG tablet Take 1 tablet (250 mg total) by mouth 2 (two) times daily. 14 tablet 0   exemestane (AROMASIN) 25 MG tablet Take 1 tablet (25 mg total) by mouth daily after breakfast. 30 tablet 3   feeding supplement (ENSURE ENLIVE / ENSURE PLUS) LIQD Take 237 mLs by mouth 2 (two) times daily between meals.     levothyroxine (SYNTHROID) 100 MCG tablet Take 1 tablet (100 mcg total) by mouth daily before breakfast.     loratadine (CLARITIN) 10 MG tablet Take 10 mg by  mouth daily. Take to reduce risk of rash from everolimus.     MAGNESIUM CITRATE PO Take by mouth.     magnesium oxide (MAG-OX) 400 (240 Mg) MG tablet Take 400 mg by mouth daily.     midodrine (PROAMATINE) 10 MG tablet Take 1 tablet (10 mg total) by mouth 3 (three) times daily with meals. (Patient not taking: Reported on 02/14/2023) 90 tablet 1   Multiple Vitamin (MULTIVITAMIN WITH MINERALS) TABS tablet Take 1 tablet by mouth daily.     ondansetron (ZOFRAN) 8 MG tablet Take 1 tablet (8 mg total) by mouth every 8 (eight) hours as needed. 30 tablet 1   ondansetron (ZOFRAN) 8 MG tablet TAKE 1 TABLET(8 MG) BY MOUTH EVERY 8 HOURS AS NEEDED FOR NAUSEA OR VOMITING 20 tablet 2   pantoprazole (PROTONIX) 20 MG tablet TAKE 1 TABLET(20 MG) BY MOUTH DAILY 90 tablet 0   potassium chloride SA (KLOR-CON M) 20 MEQ tablet Take 1 tablet (20  mEq total) by mouth 2 (two) times daily. 180 tablet 3   Probiotic Product (ALIGN) 4 MG CAPS Take 1 capsule by mouth daily.     prochlorperazine (COMPAZINE) 10 MG tablet TAKE 1 TABLET(10 MG) BY MOUTH EVERY 6 HOURS AS NEEDED FOR NAUSEA OR VOMITING 30 tablet 2   rosuvastatin (CRESTOR) 5 MG tablet Take 1 tablet (5 mg total) by mouth daily. 90 tablet 3   traMADol (ULTRAM) 50 MG tablet Take 1 tablet (50 mg total) by mouth every 6 (six) hours as needed. 60 tablet 0   traZODone (DESYREL) 50 MG tablet Take 1 tablet (50 mg total) by mouth at bedtime. 30 tablet 2   No current facility-administered medications for this visit.    VITAL SIGNS: There were no vitals taken for this visit. There were no vitals filed for this visit.   Estimated body mass index is 27.21 kg/m as calculated from the following:   Height as of an earlier encounter on 02/14/23: 5\' 6"  (1.676 m).   Weight as of an earlier encounter on 02/14/23: 168 lb 9.6 oz (76.5 kg).  LABS: CBC:    Component Value Date/Time   WBC 4.4 02/13/2023 1240   HGB 11.4 (L) 02/13/2023 1240   HGB 10.5 (L) 10/08/2022 0902   HGB 14.2 02/11/2011 1349   HCT 35.4 (L) 02/13/2023 1240   HCT 40.9 02/11/2011 1349   PLT 249 02/13/2023 1240   PLT 249 10/08/2022 0902   PLT 151 02/11/2011 1349   MCV 88.7 02/13/2023 1240   MCV 89 02/11/2011 1349   NEUTROABS 3.3 02/13/2023 1240   LYMPHSABS 0.5 (L) 02/13/2023 1240   MONOABS 0.5 02/13/2023 1240   EOSABS 0.1 02/13/2023 1240   BASOSABS 0.0 02/13/2023 1240   Comprehensive Metabolic Panel:    Component Value Date/Time   NA 135 02/13/2023 1240   NA 142 02/11/2011 1349   K 4.1 02/13/2023 1240   K 3.8 02/11/2011 1349   CL 103 02/13/2023 1240   CL 107 02/11/2011 1349   CO2 23 02/13/2023 1240   CO2 24 02/11/2011 1349   BUN 21 02/13/2023 1240   BUN 14 02/11/2011 1349   CREATININE 1.16 (H) 02/13/2023 1240   CREATININE 0.65 02/11/2011 1349   GLUCOSE 95 02/13/2023 1240   GLUCOSE 98 02/11/2011 1349   CALCIUM  9.7 02/13/2023 1240   CALCIUM 9.0 02/11/2011 1349   AST 39 02/13/2023 1240   ALT 33 02/13/2023 1240   ALT 36 02/11/2011 1349   ALKPHOS 171 (H) 02/13/2023 1240  ALKPHOS 122 02/11/2011 1349   BILITOT 0.4 02/13/2023 1240   PROT 7.0 02/13/2023 1240   PROT 7.0 02/11/2011 1349   ALBUMIN 3.7 02/13/2023 1240   ALBUMIN 4.1 02/11/2011 1349    RADIOGRAPHIC STUDIES: No results found.  PERFORMANCE STATUS (ECOG) : 2 - Symptomatic, <50% confined to bed  Review of Systems Unless otherwise noted, a complete review of systems is negative.  Physical Exam General: NAD Pulmonary: Unlabored Extremities: no edema, no joint deformities Skin: no rashes Neurological: Weakness but otherwise nonfocal  IMPRESSION: Patient returns for follow-up.  Patient reports he is doing about the same.  Does have recent onset back pain and this was discussed with [name] who is ordered an MRI.  Patient has tramadol to take as needed for pain but she takes infrequently due to constipation.  Recommend daily bowel regimen.  Discussed CODE STATUS.  Patient agreed with no CPR but would like to remain a full scope of treatment including short-term intubation if needed.  Discussed the probable futility associated with intubation in the setting of end-of-life care with an advanced cancer.  Patient stated that she would think about these decisions and consider changing positions in the future if needed.  I completed a MOST form today. The patient and family outlined their wishes for the following treatment decisions:  Cardiopulmonary Resuscitation: Do Not Attempt Resuscitation (DNR/No CPR)  Medical Interventions: Full Scope of Treatment: Use intubation, advanced airway interventions, mechanical ventilation, cardioversion as indicated, medical treatment, IV fluids, etc, also provide comfort measures. Transfer to the hospital if indicated  Antibiotics: Antibiotics if indicated  IV Fluids: IV fluids if indicated  Feeding  Tube: No feeding tube    PLAN: -Continue current scope of treatment -MOST form completed -Limited code -Follow-up 2 weeks  Case and plan discussed with [name]  Patient expressed understanding and was in agreement with this plan. She also understands that She can call the clinic at any time with any questions, concerns, or complaints.     Time Total: 15 minutes  Visit consisted of counseling and education dealing with the complex and emotionally intense issues of symptom management and palliative care in the setting of serious and potentially life-threatening illness.Greater than 50%  of this time was spent counseling and coordinating care related to the above assessment and plan.  Signed by: Laurette Schimke, PhD, NP-C

## 2023-02-14 NOTE — Progress Notes (Signed)
Started taking antibiotics yesterday for UTI. Complaining of lower back pain that has been going on since last month.

## 2023-02-14 NOTE — Progress Notes (Signed)
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  INTERVAL HISTORY: Patient returns to clinic today for further evaluation and continued discussions on whether or not to reinitiate treatment.  She is currently on antibiotics for a UTI.  She continues to have chronic weakness and fatigue as well.  Her back pain feels worse over the last several weeks. She continues to have a mild peripheral neuropathy, but no other neurologic complaints.  She denies any chest pain, shortness of breath, cough, or hemoptysis.  She denies any nausea, vomiting, constipation, or diarrhea.  She has no urinary complaints.  Patient offers no further specific complaints today.  REVIEW OF SYSTEMS:   Review of Systems  Constitutional:  Positive for malaise/fatigue. Negative for fever and weight loss.  HENT:  Negative for congestion.   Respiratory: Negative.  Negative for cough and shortness of breath.   Cardiovascular: Negative.  Negative for chest pain and leg swelling.  Gastrointestinal: Negative.  Negative for abdominal pain, constipation, diarrhea and nausea.  Genitourinary:  Positive for dysuria. Negative for flank pain and urgency.  Musculoskeletal:  Positive for back pain. Negative for joint pain.  Skin: Negative.  Negative for rash.  Neurological:  Positive for tingling, sensory change and weakness. Negative for dizziness, focal weakness and headaches.   Psychiatric/Behavioral:  The patient is not nervous/anxious.     As per HPI. Otherwise, a complete review of systems is negative.  PAST MEDICAL HISTORY: Past Medical History:  Diagnosis Date   Acute hypoxic respiratory failure (HCC) 11/26/2022   AKI (acute kidney injury) (HCC) 07/18/2022   Anxiety    Breast cancer, left (HCC) 07/2017   Depression    Electrolyte abnormality 07/18/2022   Elevated troponin 02/17/2015   Hypercalcemia 11/26/2022   Hypertension    Hypothyroidism    Personal history of chemotherapy 2019   LEFT mastectomy-chemo before   Personal history of radiation therapy 03/2018   LEFT mastectomy   Rapid heart rate    Thyroid disease     PAST SURGICAL HISTORY: Past Surgical History:  Procedure Laterality Date   AXILLARY LYMPH NODE BIOPSY Left 07/16/2017   METASTATIC MAMMARY CARCINOMA   BREAST BIOPSY Left 07/16/2017   [place] bx of left breast mass and left breast LN.  INVASIVE MAMMARY CARCINOMA, NO SPECIAL TYPE.    BREAST EXCISIONAL BIOPSY Right 2001   benign   BREAST LUMPECTOMY WITH SENTINEL LYMPH NODE BIOPSY Left 12/06/2017   Procedure: BREAST LUMPECTOMY WITH SENTINEL LYMPH NODE BX;  Surgeon: Earline Mayotte, MD;  Location: [facility] ORS;  Service: General;  Laterality: Left;   COLONOSCOPY     MASTECTOMY Left 12/23/2017   PORTACATH PLACEMENT Right 08/07/2017   Procedure: INSERTION PORT-A-CATH;  Surgeon: Earline Mayotte, MD;  Location: [facility] ORS;  Service: General;  Laterality: Right;   SIMPLE MASTECTOMY WITH AXILLARY SENTINEL NODE BIOPSY Left 12/23/2017   T2,N2 with 6/7 nodes positive. Whole breast radiation.  Surgeon: Earline Mayotte, MD;  Location: [facility] ORS;  Service: General;  Laterality: Left;   TONSILLECTOMY  FAMILY HISTORY: Family History  Problem Relation Age of Onset   Stroke Mother    Thyroid disease Mother    Renal Disease Mother    Stroke Father    Heart attack Father    Sudden death Father 34       suicide   Anuerysm Brother     Breast cancer Neg Hx     ADVANCED DIRECTIVES (Y/N):  N  HEALTH MAINTENANCE: Social History   Tobacco Use   Smoking status: Former    Current packs/day: 0.00    Average packs/day: 1 pack/day for 30.0 years (30.0 ttl pk-yrs)    Types: Cigarettes    Start date: 32    Quit date: 2019    Years since quitting: 6.0   Smokeless tobacco: Never  Vaping Use   Vaping status: Never Used  Substance Use Topics   Alcohol use: Not Currently   Drug use: Never     Colonoscopy:  PAP:  Bone density:  Lipid panel:  Allergies  Allergen Reactions   Sulfa Antibiotics Diarrhea    Current Outpatient Medications  Medication Sig Dispense Refill   acetaminophen (TYLENOL) 500 MG tablet Take 1,000 mg by mouth every 6 (six) hours as needed for moderate pain or headache.      ascorbic acid (VITAMIN C) 500 MG tablet Take 500 mg by mouth daily.     aspirin EC 81 MG tablet Take 81 mg by mouth at bedtime.      Cholecalciferol (VITAMIN D3) 125 MCG (5000 UT) CAPS Take 5,000 Units by mouth 2 (two) times a day.     ciprofloxacin (CIPRO) 250 MG tablet Take 1 tablet (250 mg total) by mouth 2 (two) times daily. 14 tablet 0   exemestane (AROMASIN) 25 MG tablet Take 1 tablet (25 mg total) by mouth daily after breakfast. 30 tablet 3   feeding supplement (ENSURE ENLIVE / ENSURE PLUS) LIQD Take 237 mLs by mouth 2 (two) times daily between meals.     levothyroxine (SYNTHROID) 100 MCG tablet Take 1 tablet (100 mcg total) by mouth daily before breakfast.     loratadine (CLARITIN) 10 MG tablet Take 10 mg by mouth daily. Take to reduce risk of rash from everolimus.     MAGNESIUM CITRATE PO Take by mouth.     magnesium oxide (MAG-OX) 400 (240 Mg) MG tablet Take 400 mg by mouth daily.     Multiple Vitamin (MULTIVITAMIN WITH MINERALS) TABS tablet Take 1 tablet by mouth daily.     ondansetron (ZOFRAN) 8 MG tablet Take 1 tablet (8 mg total) by mouth every 8 (eight) hours as needed. 30 tablet 1   ondansetron (ZOFRAN) 8 MG  tablet TAKE 1 TABLET(8 MG) BY MOUTH EVERY 8 HOURS AS NEEDED FOR NAUSEA OR VOMITING 20 tablet 2   pantoprazole (PROTONIX) 20 MG tablet TAKE 1 TABLET(20 MG) BY MOUTH DAILY 90 tablet 0   potassium chloride SA (KLOR-CON M) 20 MEQ tablet Take 1 tablet (20 mEq total) by mouth 2 (two) times daily. 180 tablet 3   Probiotic Product (ALIGN) 4 MG CAPS Take 1 capsule by mouth daily.     prochlorperazine (COMPAZINE) 10 MG tablet TAKE 1 TABLET(10 MG) BY MOUTH EVERY 6 HOURS AS NEEDED FOR NAUSEA OR VOMITING 30 tablet 2   rosuvastatin (CRESTOR) 5 MG tablet Take 1 tablet (5 mg total) by mouth daily. 90 tablet 3   traMADol (ULTRAM) 50 MG tablet Take 1 tablet (50 mg total) by mouth every 6 (six) hours as  needed. 60 tablet 0   traZODone (DESYREL) 50 MG tablet Take 1 tablet (50 mg total) by mouth at bedtime. 30 tablet 2   ALPRAZolam (XANAX) 0.5 MG tablet Take 1 tablet (0.5 mg total) by mouth 2 (two) times daily as needed for anxiety. 60 tablet 1   amLODipine (NORVASC) 5 MG tablet Take 5 mg by mouth daily. (Patient not taking: Reported on 02/14/2023)     calcium carbonate (OS-CAL - DOSED IN MG OF ELEMENTAL CALCIUM) 1250 (500 Ca) MG tablet Take 1 tablet by mouth daily with breakfast. (Patient not taking: Reported on 12/06/2022)     midodrine (PROAMATINE) 10 MG tablet Take 1 tablet (10 mg total) by mouth 3 (three) times daily with meals. (Patient not taking: Reported on 02/14/2023) 90 tablet 1   No current facility-administered medications for this visit.    OBJECTIVE: Vitals:   02/14/23 1008  BP: 104/65  Pulse: 100  Resp: 16  Temp: 98.6 F (37 C)  SpO2: 98%      Body mass index is 27.21 kg/m.    ECOG FS:2 - Symptomatic, <50% confined to bed  General: Well-developed, well-nourished, no acute distress.  Sitting in a wheelchair. Eyes: Pink conjunctiva, anicteric sclera. HEENT: Normocephalic, moist mucous membranes. Lungs: No audible wheezing or coughing. Heart: Regular rate and rhythm. Abdomen: Soft, nontender,  no obvious distention. Musculoskeletal: No edema, cyanosis, or clubbing. Neuro: Alert, answering all questions appropriately. Cranial nerves grossly intact. Skin: No rashes or petechiae noted. Psych: Normal affect.  LAB RESULTS:  Lab Results  Component Value Date   NA 135 02/13/2023   K 4.1 02/13/2023   CL 103 02/13/2023   CO2 23 02/13/2023   GLUCOSE 95 02/13/2023   BUN 21 02/13/2023   CREATININE 1.16 (H) 02/13/2023   CALCIUM 9.7 02/13/2023   PROT 7.0 02/13/2023   ALBUMIN 3.7 02/13/2023   AST 39 02/13/2023   ALT 33 02/13/2023   ALKPHOS 171 (H) 02/13/2023   BILITOT 0.4 02/13/2023   GFRNONAA 50 (L) 02/13/2023   GFRAA 57 (L) 10/01/2019    Lab Results  Component Value Date   WBC 4.4 02/13/2023   NEUTROABS 3.3 02/13/2023   HGB 11.4 (L) 02/13/2023   HCT 35.4 (L) 02/13/2023   MCV 88.7 02/13/2023   PLT 249 02/13/2023     STUDIES: No results found.   ONCOLOGY HISTORY: Patient initially received neoadjuvant chemotherapy with Adriamycin and Cytoxan followed by weekly Taxol. She only received 4 cycles of weekly Taxol prior to discontinuation of treatment on October 31, 2017 secondary to persistent peripheral neuropathy.  She ultimately required mastectomy and final pathology noted 5 of 6 lymph nodes positive for disease.  Patient completed adjuvant XRT in mid April 2020.  Pain initiated letrozole in May 2020, this was discontinued secondary to side effects and patient was started on anastrozole.  Nuclear medicine bone scan on June 19, 2019 revealed metastatic disease and anastrozole was subsequently discontinued.  PET scan results from October 02, 2021 revealed no clear evidence of metastatic progression.  PET scan results from June 30, 2019 reviewed independently with metastatic bony disease, but no obvious evidence of visceral disease.  MRI of the brain on August 24, 2019 reviewed independently with no obvious evidence of metastatic disease.   ASSESSMENT: Recurrent stage IV ER/PR  positive, 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  Patient's CA 27-29 continues to slowly  increase and now is 324.3.  Patient has not received any treatment other than Aromasin in nearly 3 months and treatment over the last 6 months has been intermittent at best.  [name] has been permanently discontinued given her issues with her mandible.  Patient wishes to continue to hold treatment, and states she is hesitant to restart Afinitor.  She may get benefit from rechallenge her with a CDK 4 6 inhibitor, but the efficacy is unknown.  Return to clinic in 2 weeks for further evaluation and initiation of treatment if desired.  Appreciate palliative care input.   Constipation: Intermittent.  Patient has been instructed she needs a regular daily bowel regimen. Peripheral neuropathy: Chronic and unchanged. History of pulmonary embolus: Patient was diagnosed with a small pulmonary embolus on January 10, 2018.  She is no longer on anticoagulation.  There was no evidence of recurrent PE on CT scan from October 27, 2019. Osteopenia: Patient's most recent bone mineral density on March 03, 2019 reported a T score of -1.8 which is only mildly decreased from 1 year prior when the reported T score was -1.6.  Continue calcium and vitamin D supplementation.  Patient no longer will receive bisphosphonates.   Left hip/flank pain: Patient states this is slightly worse, but attributes it to increased physical therapy and activity.  Continue tramadol as prescribed.  She has now completed XRT.   Anemia: Hemoglobin in use to improve and is now 11.4.   Renal insufficiency: Creatinine mildly improved to 1.16. Hypokalemia: Resolved.  Continue oral potassium supplementation. Dental pathology: Significantly improved.  Continue follow up with Dr. Willaim Bane.  Patient has discontinued hyperbaric  treatments at [facility]. Lymphedema: Chronic and unchanged.  Follow-up with occupational therapy as scheduled. Coping/anxiety: Continue trazodone at night.  Patient was also given a referral to [facility].  Back pain: Will get an MRI of her lumbar and speckle spine to further evaluate.  Patient expressed understanding and was in agreement with this plan. She also understands that She can call clinic at any time with any questions, concerns, or complaints.    Cancer Staging  Breast cancer, stage 2, left (HCC) Staging form: Breast, AJCC 8th Edition - Clinical stage from 07/30/2017: Stage IIA (cT2, cN1, cM0, G2, ER+, PR+, HER2-) - Signed by Alicia Ruths, MD on 07/30/2017 Histologic grading system: 3 grade system Laterality: Left   Alicia Ruths, MD   02/14/2023 12:20 PM

## 2023-02-14 NOTE — Progress Notes (Signed)
Patient not restarting treatment at this time so not seen today.

## 2023-02-16 ENCOUNTER — Encounter: Payer: Self-pay | Admitting: Oncology

## 2023-02-16 LAB — URINE CULTURE: Culture: >=100000 — AB

## 2023-02-19 ENCOUNTER — Ambulatory Visit
Admission: RE | Admit: 2023-02-19 | Discharge: 2023-02-19 | Disposition: A | Discharge status: Home / Self Care | Payer: Medicare Other | Source: Ambulatory Visit | Attending: Oncology | Admitting: Oncology

## 2023-02-19 DIAGNOSIS — C7951 Secondary malignant neoplasm of bone: Secondary | ICD-10-CM | POA: Diagnosis not present

## 2023-02-19 DIAGNOSIS — M47816 Spondylosis without myelopathy or radiculopathy, lumbar region: Secondary | ICD-10-CM | POA: Diagnosis not present

## 2023-02-19 DIAGNOSIS — R531 Weakness: Secondary | ICD-10-CM | POA: Diagnosis not present

## 2023-02-19 DIAGNOSIS — M5136 Other intervertebral disc degeneration, lumbar region with discogenic back pain only: Secondary | ICD-10-CM | POA: Diagnosis not present

## 2023-02-19 DIAGNOSIS — C801 Malignant (primary) neoplasm, unspecified: Secondary | ICD-10-CM | POA: Diagnosis not present

## 2023-02-19 DIAGNOSIS — M545 Low back pain, unspecified: Secondary | ICD-10-CM | POA: Diagnosis not present

## 2023-02-19 DIAGNOSIS — C50919 Malignant neoplasm of unspecified site of unspecified female breast: Secondary | ICD-10-CM | POA: Diagnosis not present

## 2023-02-19 MED ORDER — GADOBUTROL 1 MMOL/ML IV SOLN
7.5000 mL | Freq: Once | INTRAVENOUS | Status: AC | PRN
  Administered 2023-02-19: 7.5 mL via INTRAVENOUS

## 2023-02-21 ENCOUNTER — Ambulatory Visit: Payer: Medicare Other | Attending: Cardiology | Admitting: Cardiology

## 2023-02-21 ENCOUNTER — Encounter: Payer: Self-pay | Admitting: Cardiology

## 2023-02-21 VITALS — BP 100/58 | HR 78 | Ht 66.0 in | Wt 167.0 lb

## 2023-02-21 DIAGNOSIS — E785 Hyperlipidemia, unspecified: Secondary | ICD-10-CM | POA: Diagnosis not present

## 2023-02-21 DIAGNOSIS — I700 Atherosclerosis of aorta: Secondary | ICD-10-CM | POA: Insufficient documentation

## 2023-02-21 DIAGNOSIS — I5032 Chronic diastolic (congestive) heart failure: Secondary | ICD-10-CM | POA: Insufficient documentation

## 2023-02-21 DIAGNOSIS — R0602 Shortness of breath: Secondary | ICD-10-CM | POA: Diagnosis not present

## 2023-02-21 DIAGNOSIS — E878 Other disorders of electrolyte and fluid balance, not elsewhere classified: Secondary | ICD-10-CM | POA: Insufficient documentation

## 2023-02-21 DIAGNOSIS — R002 Palpitations: Secondary | ICD-10-CM | POA: Diagnosis not present

## 2023-02-21 DIAGNOSIS — I471 Supraventricular tachycardia, unspecified: Secondary | ICD-10-CM | POA: Insufficient documentation

## 2023-02-21 DIAGNOSIS — I951 Orthostatic hypotension: Secondary | ICD-10-CM | POA: Diagnosis not present

## 2023-02-21 DIAGNOSIS — I10 Essential (primary) hypertension: Secondary | ICD-10-CM | POA: Diagnosis not present

## 2023-02-21 MED ORDER — POTASSIUM CHLORIDE CRYS ER 20 MEQ PO TBCR: 40.0000 meq | EXTENDED_RELEASE_TABLET | Freq: Two times a day (BID) | ORAL | 3 refills | Status: DC

## 2023-02-21 NOTE — Patient Instructions (Signed)
 Medication Instructions:  Your physician has recommended you make the following change in your medication:   INCREASE Potassium to 40 mEq twice a day  *If you need a refill on your cardiac medications before your next appointment, please call your pharmacy*   Lab Work: None  If you have labs (blood work) drawn today and your tests are completely normal, you will receive your results only by: MyChart Message (if you have MyChart) OR A paper copy in the mail If you have any lab test that is abnormal or we need to change your treatment, we will call you to review the results.   Testing/Procedures: None   Follow-Up: At [facility], you and your health needs are our priority.  As part of our continuing mission to provide you with exceptional heart care, we have created designated Provider Care Teams.  These Care Teams include your primary Cardiologist (physician) and Advanced Practice Providers (APPs -  Physician Assistants and Nurse Practitioners) who all work together to provide you with the care you need, when you need it.    Your next appointment:   3 month(s)  Provider:   Lonni Hanson, MD or Tylene Lunch, NP

## 2023-02-21 NOTE — Progress Notes (Signed)
 Cardiology Office Note:  .   Date:  02/21/2023  ID:  Alicia [name], DOB 09-01-1950, MRN [id number] PCP: Britta King, MD  [facility] Providers Cardiologist:  Lonni Hanson, MD    History of Present Illness: .   Alicia [name] is a 73 y.o. female with a past medical history of remote PE, left-sided breast cancer status postmastectomy and chemotherapy recently diagnosed with bone metastasis, aortic atherosclerosis, hypertension, anxiety, panic disorder, thyroid  disease, who presents today for follow-up of her aortic atherosclerosis and hypertension.   Coronary CTA in 2017 showed no evidence of coronary artery disease with calcium  score of 0.  Echo at that time showed normal LV size and systolic function, G1 DD, mild left atrial enlargement, normal RV systolic function and ventricular cavity size, mild to moderate mitral and aortic regurgitation, mild to moderate pulmonic regurgitation mild tricuspid regurgitation.  Repeat echocardiogram in 2021 revealed EF 55 to 60%, no RWMA, mild to moderate mitral regurgitation, mild aortic valve insufficiency.  Heart monitor was notable for PACs and PVCs as well as brief episodes of PSVT, transient Mobitz type I secondary AV block was observed.  She was referred to pulmonary for evaluation of sleep apnea.   She presented to [facility] 08/04/2022 with altered mental status.  Per family she not taking any medications for about a week prior to hospital admission.  She had an acute UTI, acute metabolic encephalopathy, hypokalemia, hypophosphatemia.  She was treated with IV fluids and empiric antibiotics and electrolytes were repleted.  Hospital course was complicated by sepsis and pneumonia which necessitated reinitiation of IV antibiotics.  She was then stable for discharge on 08/01/2022.  She was last seen in clinic 10/23/2022 at that time she had been doing fairly well.  She denies any chest discomfort or worsening shortness of breath or palpitation.  She  continues to have some swelling to bilateral lower extremities but been trying to decrease her fluid intake.  There were no medication changes that were made and no further testing that was ordered at that time.   Patient presented to the [facility] emergency department 11/26/2022 with shortness of breath and hypoxia.  She was diagnosed with acute hypoxic respiratory failure.  Vital signs reveal temperature 97.9, respiration rate of 18, heart rate 83, blood pressure 122/81, oxygen saturation 92% on 2 L, 72% on room air with no oxygen.  Serum calcium  was greater than 15, lactic acid 1.9 repeat was 2.4.  Serum creatinine was 2.76, hemoglobin of 11.  She was treated with 500 mL normal saline bolus and was admitted to the hospitalist service.  VQ lung scan was negative for PE and bilateral Dopplers were negative for DVTs.  She was treated with antibiotic therapy of azithromycin  and cefepime .  She becomes symptomatic with dizziness and lightheadedness and had positive orthostatic vital signs despite adequate IV hydration.  She was started on midodrine  then it was subsequently increased to 10 mg 3 times daily with an improvement in blood pressure.  Her amlodipine , HCTZ, metoprolol  titrate were discontinued during her hospitalization due to hypotension she was considered stable for discharge on 12/03/2022.  She returns to clinic today accompanied by family member.  Overall today she states that she is doing family is concerned and she has not eating or drinking enough.  Previously she was placed on fluid restriction due to elevated BNP and swollen left arm that required wrapping due to lymphedema and edema.  Recent hospitalization she had to be taken off HCTZ due to electrolyte  abnormalities.  She denies any chest pain or worsening shortness of breath is not requiring oxygen therapy at the time.  She was discharged from the hospital on midodrine  which is now on an as needed basis.  She has been on several antibiotics.  Just  recently finished ciprofloxacin  for recurrent UTI.  States that she has been compliant with her current medication regimen and has not had any adverse effects.  Just recently had labs completed and her electrolyte abnormalities had corrected since discharge from the hospital.  ROS: 10 point review of systems has been reviewed and considered negative except what is been listed in the HPI  Studies Reviewed: SABRA   EKG Interpretation Date/Time:  Thursday February 21 2023 13:49:16 EST Ventricular Rate:  78 PR Interval:  174 QRS Duration:  78 QT Interval:  384 QTC Calculation: 437 R Axis:   66  Text Interpretation: Normal sinus rhythm Normal ECG When compared with ECG of 26-Nov-2022 12:37, Left ventricular hypertrophy Confirmed by Gerard Frederick ([id number]) on 02/21/2023 2:01:41 PM    TTE 10/19/22 1. Left ventricular ejection fraction, by estimation, is 55 to 60%. The  left ventricle has normal function. The left ventricle has no regional  wall motion abnormalities. Left ventricular diastolic parameters are  consistent with Grade I diastolic  dysfunction (impaired relaxation).   2. Right ventricular systolic function is normal. The right ventricular  size is normal.   3. The mitral valve is normal in structure. No evidence of mitral valve  regurgitation.   4. The aortic valve is tricuspid. Aortic valve regurgitation is mild.   5. The inferior vena cava is normal in size with greater than 50%  respiratory variability, suggesting right atrial pressure of 3 mmHg.    2D echo 12/03/2019: 1. Left ventricular ejection fraction, by estimation, is 55 to 60%. The  left ventricle has normal function. The left ventricle has no regional  wall motion abnormalities. Left ventricular diastolic parameters were  normal.   2. Right ventricular systolic function is normal. The right ventricular  size is normal. There is normal pulmonary artery systolic pressure.   3. The mitral valve is normal in structure. Mild  to moderate mitral valve  regurgitation.   4. The aortic valve is tricuspid. Aortic valve regurgitation is mild.   5. The inferior vena cava is normal in size with greater than 50%  respiratory variability, suggesting right atrial pressure of 3 mmHg. __________   MUGA 07/2017: IMPRESSION: Normal LEFT ventricular ejection fraction of 61%. Normal LEFT ventricular wall motion. __________   Nuclear stress test 02/2015: Defect 1: There is a small defect of mild severity present in the basal anterior location. Findings consistent with ischemia. This is an intermediate risk study. The left ventricular ejection fraction is hyperdynamic (>65%). Risk Assessment/Calculations:             Physical Exam:   VS:  BP (!) 100/58 (BP Location: Right Arm)   Pulse 78   Ht 5' 6 (1.676 m)   Wt 167 lb (75.8 kg)   SpO2 95%   BMI 26.95 kg/m    Wt Readings from Last 3 Encounters:  02/21/23 167 lb (75.8 kg)  02/14/23 168 lb 9.6 oz (76.5 kg)  01/17/23 178 lb (80.7 kg)    GEN: Well nourished, well developed in no acute distress NECK: No JVD; No carotid bruits CARDIAC: RRR, no murmurs, rubs, gallops RESPIRATORY:  Clear to auscultation without rales, wheezing or rhonchi  ABDOMEN: Soft, non-tender, non-distended EXTREMITIES:  1+  edema; No deformity   ASSESSMENT AND PLAN: .   Orthostatic hypotension that has improved since discharge from the hospital.  Blood pressure still soft today at 100/58.  She has been encouraged to remain off of amlodipine  and HCTZ.  She also has midodrine  10 mg that she takes as needed for lower blood pressures up to 3 times per day.  She has also been adding volume expanders to her water to help with her fluid restriction.  She was also advised that too much water will also impact her hyponatremia she was treated for in the hospital.  After further discussion with her lower blood pressures decreased appetite and being off of diuretic therapy she has been encouraged to add an  additional bottle of water to her daily intake for 3 bottles daily.  She has been advised to continue to weigh daily and continue to monitor her fluid intake.  HFpEF/shortness of breath that has improved where she is not requiring oxygen around-the-clock.  She has been monitoring her oxygen saturations at home via pulse ox and oxygen has been maintaining in the 90s on room air.  Prior echocardiogram revealed an LVEF of 55 to 60%, no RWMA, G1 DD and mild aortic regurgitation.  Unfortunately she is not a candidate for SGLT2 inhibitors with recurrent UTIs, she is not on beta-blocker therapy due to prior abnormal ZIO monitoring, and MRAs can be considered at a later date once her orthostatic hypotension has resolved completely.  Aortic atherosclerosis with hyperlipidemia.  Aortic atherosclerosis was incidentally noted on CT of the chest during an emergency department visit.  Previous coronary CTA revealed right normal dominant coronary arteries with a coronary calcium  score of 0 in 2017.  For secondary prevention she has been continued on aspirin  81 mg daily and rosuvastatin  5 mg daily.  Left upper extremity lymphedema which has improved and is no longer requiring compression wraps to the arm.  Last duplex was negative for DVT.  And she follows with lymphedema clinic.  History of palpitations with PSVT EKG today reveals sinus rhythm with a rate of 78, LVH, and no acute change from prior studies.  She is no longer on beta-blocker therapy due to recurrent bradycardia and abnormal ZIO monitoring previously.  Will continue to monitor with surveillance EKGs.  Breast cancer of the left breast which she continues to be followed by oncology.  Electrolyte derangements previously during hospitalization where she is continued on potassium supplements of 40 mEq twice daily which she is requesting a refill of her supplements today.  She also is continued on magnesium  400 mg daily.  Calcium  was discontinued.        Dispo: Patient to return to clinic to see MD/APP in 3 months or sooner if needed  Signed, Elver Stadler, NP

## 2023-02-27 ENCOUNTER — Inpatient Hospital Stay: Payer: Medicare Other | Attending: Oncology

## 2023-02-27 DIAGNOSIS — N189 Chronic kidney disease, unspecified: Secondary | ICD-10-CM | POA: Diagnosis not present

## 2023-02-27 DIAGNOSIS — K5900 Constipation, unspecified: Secondary | ICD-10-CM | POA: Diagnosis not present

## 2023-02-27 DIAGNOSIS — I890 Lymphedema, not elsewhere classified: Secondary | ICD-10-CM | POA: Diagnosis not present

## 2023-02-27 DIAGNOSIS — Z170 Estrogen receptor positive status [ER+]: Secondary | ICD-10-CM | POA: Diagnosis not present

## 2023-02-27 DIAGNOSIS — Z87891 Personal history of nicotine dependence: Secondary | ICD-10-CM | POA: Diagnosis not present

## 2023-02-27 DIAGNOSIS — Z86711 Personal history of pulmonary embolism: Secondary | ICD-10-CM | POA: Diagnosis not present

## 2023-02-27 DIAGNOSIS — M549 Dorsalgia, unspecified: Secondary | ICD-10-CM | POA: Diagnosis not present

## 2023-02-27 DIAGNOSIS — C7951 Secondary malignant neoplasm of bone: Secondary | ICD-10-CM | POA: Insufficient documentation

## 2023-02-27 DIAGNOSIS — D649 Anemia, unspecified: Secondary | ICD-10-CM | POA: Insufficient documentation

## 2023-02-27 DIAGNOSIS — G629 Polyneuropathy, unspecified: Secondary | ICD-10-CM | POA: Insufficient documentation

## 2023-02-27 DIAGNOSIS — M8580 Other specified disorders of bone density and structure, unspecified site: Secondary | ICD-10-CM | POA: Diagnosis not present

## 2023-02-27 DIAGNOSIS — F419 Anxiety disorder, unspecified: Secondary | ICD-10-CM | POA: Diagnosis not present

## 2023-02-27 DIAGNOSIS — C50812 Malignant neoplasm of overlapping sites of left female breast: Secondary | ICD-10-CM | POA: Insufficient documentation

## 2023-02-27 DIAGNOSIS — C50912 Malignant neoplasm of unspecified site of left female breast: Secondary | ICD-10-CM

## 2023-02-27 LAB — CBC WITH DIFFERENTIAL/PLATELET
Abs Immature Granulocytes: 0.06 10*3/uL (ref 0.00–0.07)
Basophils Absolute: 0 10*3/uL (ref 0.0–0.1)
Basophils Relative: 0 %
Eosinophils Absolute: 0.1 10*3/uL (ref 0.0–0.5)
Eosinophils Relative: 2 %
HCT: 33.5 % — ABNORMAL LOW (ref 36.0–46.0)
Hemoglobin: 11 g/dL — ABNORMAL LOW (ref 12.0–15.0)
Immature Granulocytes: 1 %
Lymphocytes Relative: 19 %
Lymphs Abs: 1.3 10*3/uL (ref 0.7–4.0)
MCH: 28.6 pg (ref 26.0–34.0)
MCHC: 32.8 g/dL (ref 30.0–36.0)
MCV: 87 fL (ref 80.0–100.0)
Monocytes Absolute: 0.8 10*3/uL (ref 0.1–1.0)
Monocytes Relative: 12 %
Neutro Abs: 4.6 10*3/uL (ref 1.7–7.7)
Neutrophils Relative %: 66 %
Platelets: 313 10*3/uL (ref 150–400)
RBC: 3.85 MIL/uL — ABNORMAL LOW (ref 3.87–5.11)
RDW: 14.1 % (ref 11.5–15.5)
WBC: 7 10*3/uL (ref 4.0–10.5)
nRBC: 0 % (ref 0.0–0.2)

## 2023-02-27 LAB — CMP (CANCER CENTER ONLY)
ALT: 23 U/L (ref 0–44)
AST: 39 U/L (ref 15–41)
Albumin: 3.5 g/dL (ref 3.5–5.0)
Alkaline Phosphatase: 165 U/L — ABNORMAL HIGH (ref 38–126)
Anion gap: 9 (ref 5–15)
BUN: 28 mg/dL — ABNORMAL HIGH (ref 8–23)
CO2: 22 mmol/L (ref 22–32)
Calcium: 9.5 mg/dL (ref 8.9–10.3)
Chloride: 102 mmol/L (ref 98–111)
Creatinine: 1.17 mg/dL — ABNORMAL HIGH (ref 0.44–1.00)
GFR, Estimated: 49 mL/min — ABNORMAL LOW (ref >60)
Glucose, Bld: 123 mg/dL — ABNORMAL HIGH (ref 70–99)
Potassium: 4.2 mmol/L (ref 3.5–5.1)
Sodium: 133 mmol/L — ABNORMAL LOW (ref 135–145)
Total Bilirubin: 0.6 mg/dL (ref 0.0–1.2)
Total Protein: 7.3 g/dL (ref 6.5–8.1)

## 2023-02-28 ENCOUNTER — Inpatient Hospital Stay: Payer: Medicare Other | Admitting: Pharmacist

## 2023-02-28 ENCOUNTER — Other Ambulatory Visit (HOSPITAL_COMMUNITY): Payer: Self-pay

## 2023-02-28 ENCOUNTER — Inpatient Hospital Stay: Payer: Medicare Other | Admitting: Hospice and Palliative Medicine

## 2023-02-28 ENCOUNTER — Inpatient Hospital Stay (HOSPITAL_BASED_OUTPATIENT_CLINIC_OR_DEPARTMENT_OTHER): Payer: Medicare Other | Admitting: Oncology

## 2023-02-28 ENCOUNTER — Encounter: Payer: Self-pay | Admitting: Oncology

## 2023-02-28 ENCOUNTER — Telehealth: Payer: Self-pay

## 2023-02-28 VITALS — BP 114/64 | HR 80 | Temp 97.8°F | Ht 66.0 in | Wt 169.0 lb

## 2023-02-28 DIAGNOSIS — C50912 Malignant neoplasm of unspecified site of left female breast: Secondary | ICD-10-CM

## 2023-02-28 DIAGNOSIS — C50812 Malignant neoplasm of overlapping sites of left female breast: Secondary | ICD-10-CM | POA: Diagnosis not present

## 2023-02-28 DIAGNOSIS — Z170 Estrogen receptor positive status [ER+]: Secondary | ICD-10-CM | POA: Diagnosis not present

## 2023-02-28 DIAGNOSIS — N189 Chronic kidney disease, unspecified: Secondary | ICD-10-CM | POA: Diagnosis not present

## 2023-02-28 DIAGNOSIS — C7951 Secondary malignant neoplasm of bone: Secondary | ICD-10-CM | POA: Diagnosis not present

## 2023-02-28 DIAGNOSIS — D649 Anemia, unspecified: Secondary | ICD-10-CM | POA: Diagnosis not present

## 2023-02-28 DIAGNOSIS — C50911 Malignant neoplasm of unspecified site of right female breast: Secondary | ICD-10-CM

## 2023-02-28 DIAGNOSIS — K5900 Constipation, unspecified: Secondary | ICD-10-CM | POA: Diagnosis not present

## 2023-02-28 LAB — CANCER ANTIGEN 27.29: CA 27.29: 439.6 U/mL — ABNORMAL HIGH (ref 0.0–38.6)

## 2023-02-28 MED ORDER — RIBOCICLIB SUCC (400 MG DOSE) 200 MG PO TBPK
400.0000 mg | ORAL_TABLET | Freq: Every day | ORAL | 0 refills | Status: DC
  Filled 2023-03-01: qty 42, 28d supply, fill #0

## 2023-02-28 NOTE — Progress Notes (Signed)
Clinical Pharmacist Practitioner Clinic [facility]  [phone] [phone]  [name], [name], [name], [name] Jim Like, RN as Registered Nurse Orlie Dakin Tollie Pizza, MD as Consulting Physician (Oncology) Carmina Miller, MD as Referring Physician (Radiation Oncology) Chriss Driver, RN as Registered Nurse   Name of the patient: Alicia [name]  [id number]  [birth year]   Date of visit: 02/28/23  HPI: Patient is a 73 y.o. female with metastatic ER+ HER2- breast cancer. Previously treated with palbociclib, but treatment was changed to Afinitor (everolimus) and exemestane due to disease progression 03/2022. Everolimus 10 mg held on 05/22/22 due to potential drug rash, patient started on steroid taper. Rash cleared and patient resumed everolimus 10 mg on 05/31/22. Patient called to report rash return on 06/04/22. Treatment was again held and prescription for hydrocortisone 2.5% cream sent in. Patient resumed everolimus 7.5mg  on 06/18/22 along with loratidine. Patient was admitted to [facility] hospital for altered mental status, dehydration, and weakness on 07/18/22, everolimus and exemestane were held with admission and at discharge. Patient resumed everolimus (at 5mg )  and exemestane on 09/20/22. Everolimus was again held on 10/05/22 due to weakness and fatigue, everolimus 5mg  was resumed on 10/08/22. Everolimus held again on 11/10/22. Patient was in the hospital 11/26/22-12/03/22 for pneumonia.   Reason for Consult: ribociclib oral chemotherapy education.   PAST MEDICAL HISTORY: Past Medical History:  Diagnosis Date   Acute hypoxic respiratory failure (HCC) 11/26/2022   AKI (acute kidney injury) (HCC) 07/18/2022   Anxiety    Breast cancer, left (HCC) 07/2017   Depression    Electrolyte abnormality 07/18/2022   Elevated troponin 02/17/2015   Hypercalcemia 11/26/2022    Hypertension    Hypothyroidism    Personal history of chemotherapy 2019   LEFT mastectomy-chemo before   Personal history of radiation therapy 03/2018   LEFT mastectomy   Rapid heart rate    Thyroid disease     HEMATOLOGY/ONCOLOGY HISTORY:  Oncology History Overview Note  Patient is a 73 year old female who recently self palpated a mass on her left breast.  Subsequent imaging and biopsy revealed the above-stated breast cancer.  Case was also discussed extensively at case conference.  Given the size and the stage of patient's malignancy, she will benefit from neoadjuvant chemotherapy using Adriamycin, Cytoxan, and Taxol.  Patient will also require Neulasta support.  Will get CT scan of the chest, abdomen, and pelvis to assess for any metastatic disease.  Patient will also require port placement and MUGA prior to initiating treatment  CT abdomen/pelvis/chest did not reveal any suspicious lesions concerning for metastatic disease. (08/01/17)  Port-A-Cath placed on 08/07/2017.  Cycle 1 day 1 of AC was given on 08/08/17.     Breast cancer, stage 2, left (HCC)  07/24/2017 Initial Diagnosis   Breast cancer, stage 2, left (HCC)   07/30/2017 Cancer Staging   Staging form: Breast, AJCC 8th Edition - Clinical stage from 07/30/2017: Stage IIA (cT2, cN1, cM0, G2, ER+, PR+, HER2-) - Signed by Jeralyn Ruths, MD on 07/30/2017   08/08/2017 - 10/31/2017 Chemotherapy   The patient had DOXOrubicin (ADRIAMYCIN) chemo injection 130 mg, 60 mg/m2 = 130 mg, Intravenous,  Once, 4 of 4 cycles Administration: 130 mg (08/08/2017), 130 mg (08/22/2017), 130 mg (09/05/2017), 130 mg (09/19/2017) palonosetron (ALOXI) injection 0.25 mg, 0.25 mg, Intravenous,  Once, 4 of 4 cycles Administration: 0.25 mg (08/08/2017), 0.25 mg (08/22/2017), 0.25 mg (09/05/2017), 0.25  mg (09/19/2017) pegfilgrastim-cbqv (UDENYCA) injection 6 mg, 6 mg, Subcutaneous, Once, 4 of 4 cycles Administration: 6 mg (08/09/2017), 6 mg (08/23/2017), 6 mg  (09/06/2017), 6 mg (09/20/2017) cyclophosphamide (CYTOXAN) 1,300 mg in sodium chloride 0.9 % 250 mL chemo infusion, 600 mg/m2 = 1,300 mg, Intravenous,  Once, 4 of 4 cycles Administration: 1,300 mg (08/08/2017), 1,300 mg (08/22/2017), 1,300 mg (09/05/2017), 1,300 mg (09/19/2017) PACLitaxel (TAXOL) 174 mg in sodium chloride 0.9 % 250 mL chemo infusion (</= 80mg /m2), 80 mg/m2 = 174 mg, Intravenous,  Once, 4 of 12 cycles Dose modification: 72 mg/m2 (original dose 80 mg/m2, Cycle 6, Reason: Dose not tolerated) Administration: 174 mg (10/03/2017), 156 mg (10/17/2017), 156 mg (10/24/2017), 156 mg (10/31/2017) fosaprepitant (EMEND) 150 mg, dexamethasone (DECADRON) 12 mg in sodium chloride 0.9 % 145 mL IVPB, , Intravenous,  Once, 4 of 4 cycles Administration:  (08/08/2017),  (08/22/2017),  (09/05/2017),  (09/19/2017)  for chemotherapy treatment.      ALLERGIES:  is allergic to sulfa antibiotics.  MEDICATIONS:  Current Outpatient Medications  Medication Sig Dispense Refill   acetaminophen (TYLENOL) 500 MG tablet Take 1,000 mg by mouth every 6 (six) hours as needed for moderate pain or headache.      ALPRAZolam (XANAX) 0.5 MG tablet Take 1 tablet (0.5 mg total) by mouth 2 (two) times daily as needed for anxiety. 60 tablet 1   ascorbic acid (VITAMIN C) 500 MG tablet Take 500 mg by mouth daily.     aspirin EC 81 MG tablet Take 81 mg by mouth at bedtime.      Cholecalciferol (VITAMIN D3) 125 MCG (5000 UT) CAPS Take 5,000 Units by mouth 2 (two) times a day.     exemestane (AROMASIN) 25 MG tablet Take 1 tablet (25 mg total) by mouth daily after breakfast. 30 tablet 3   feeding supplement (ENSURE ENLIVE / ENSURE PLUS) LIQD Take 237 mLs by mouth 2 (two) times daily between meals.     levothyroxine (SYNTHROID) 100 MCG tablet Take 1 tablet (100 mcg total) by mouth daily before breakfast.     linaclotide (LINZESS) 145 MCG CAPS capsule Take 145 mcg by mouth daily before breakfast.     loratadine (CLARITIN) 10 MG tablet Take 10  mg by mouth daily. Take to reduce risk of rash from everolimus.     magnesium oxide (MAG-OX) 400 (240 Mg) MG tablet Take 400 mg by mouth daily.     midodrine (PROAMATINE) 10 MG tablet Take 1 tablet (10 mg total) by mouth 3 (three) times daily with meals. (Patient taking differently: Take 10 mg by mouth as needed (low bp).) 90 tablet 1   Multiple Vitamin (MULTIVITAMIN WITH MINERALS) TABS tablet Take 1 tablet by mouth daily.     ondansetron (ZOFRAN) 8 MG tablet Take 1 tablet (8 mg total) by mouth every 8 (eight) hours as needed. 30 tablet 1   pantoprazole (PROTONIX) 20 MG tablet TAKE 1 TABLET(20 MG) BY MOUTH DAILY 90 tablet 0   potassium chloride SA (KLOR-CON M) 20 MEQ tablet Take 2 tablets (40 mEq total) by mouth 2 (two) times daily. 180 tablet 3   Probiotic Product (ALIGN) 4 MG CAPS Take 1 capsule by mouth daily.     prochlorperazine (COMPAZINE) 10 MG tablet TAKE 1 TABLET(10 MG) BY MOUTH EVERY 6 HOURS AS NEEDED FOR NAUSEA OR VOMITING 30 tablet 2   rosuvastatin (CRESTOR) 5 MG tablet Take 1 tablet (5 mg total) by mouth daily. 90 tablet 3   traMADol (ULTRAM) 50 MG tablet Take  1 tablet (50 mg total) by mouth every 6 (six) hours as needed. 60 tablet 0   No current facility-administered medications for this visit.    VITAL SIGNS: There were no vitals taken for this visit. There were no vitals filed for this visit.  Estimated body mass index is 27.28 kg/m as calculated from the following:   Height as of an earlier encounter on 02/28/23: 5\' 6"  (1.676 m).   Weight as of an earlier encounter on 02/28/23: 76.7 kg (169 lb).  LABS: CBC:    Component Value Date/Time   WBC 7.0 02/27/2023 1141   HGB 11.0 (L) 02/27/2023 1141   HGB 10.5 (L) 10/08/2022 0902   HGB 14.2 02/11/2011 1349   HCT 33.5 (L) 02/27/2023 1141   HCT 40.9 02/11/2011 1349   PLT 313 02/27/2023 1141   PLT 249 10/08/2022 0902   PLT 151 02/11/2011 1349   MCV 87.0 02/27/2023 1141   MCV 89 02/11/2011 1349   NEUTROABS 4.6 02/27/2023 1141    LYMPHSABS 1.3 02/27/2023 1141   MONOABS 0.8 02/27/2023 1141   EOSABS 0.1 02/27/2023 1141   BASOSABS 0.0 02/27/2023 1141   Comprehensive Metabolic Panel:    Component Value Date/Time   NA 133 (L) 02/27/2023 1141   NA 142 02/11/2011 1349   K 4.2 02/27/2023 1141   K 3.8 02/11/2011 1349   CL 102 02/27/2023 1141   CL 107 02/11/2011 1349   CO2 22 02/27/2023 1141   CO2 24 02/11/2011 1349   BUN 28 (H) 02/27/2023 1141   BUN 14 02/11/2011 1349   CREATININE 1.17 (H) 02/27/2023 1141   CREATININE 0.65 02/11/2011 1349   GLUCOSE 123 (H) 02/27/2023 1141   GLUCOSE 98 02/11/2011 1349   CALCIUM 9.5 02/27/2023 1141   CALCIUM 9.0 02/11/2011 1349   AST 39 02/27/2023 1141   ALT 23 02/27/2023 1141   ALT 36 02/11/2011 1349   ALKPHOS 165 (H) 02/27/2023 1141   ALKPHOS 122 02/11/2011 1349   BILITOT 0.6 02/27/2023 1141   PROT 7.3 02/27/2023 1141   PROT 7.0 02/11/2011 1349   ALBUMIN 3.5 02/27/2023 1141   ALBUMIN 4.1 02/11/2011 1349     Present during today's visit: Patient and daughter in law   Start plan: Patient will plan to start ribociclib next week    CBC/CMP from 02/27/23 assessed, no baseline dose adjustments required. Prescription dose and frequency assessed.    Patient Education I spoke with patient for overview of new oral chemotherapy medication: ribociclib    Administration: Counseled patient on administration, dosing, side effects, monitoring, drug-food interactions, safe handling, storage, and disposal. Patient will take 400 mg by mouth once daily for 21 days on, then 7 days off to complete a 28 day cycle. Continue until disease progression or unacceptable toxicity.   Side Effects: Side effects include but not limited to: nausea, diarrhea, myelosuppression, or fatigue.    Drug-drug Interactions (DDI): Alprazolam: ribociclib may increase serum concentrations of alprazolam. Monitor for increased adverse effects of alprazolam (i.e. sedation, lethargy)  Ondansetron: may enhance  QTc-prolonging effect of ribociclib. EKG from 02/21/23 WNL. Proceed with routine EKG monitoring with ribociclib initiation     Tramadol: ribociclib may increase serum concentrations of tramadol. Monitor for increased adverse effects of tramadol (i.e. respiratory depression, sedation)     Adherence: After discussion with patient no patient barriers to medication adherence identified.  Reviewed with patient importance of keeping a medication schedule and plan for any missed doses.  She voiced understanding and appreciation. All questions  answered. Medication handout provided.  Provided patient with Oral Chemotherapy Navigation Clinic phone number. Patient knows to call the office with questions or concerns. Oral Chemotherapy Navigation Clinic will continue to follow.  Patient expressed understanding and was in agreement with this plan. She also understands that She can call clinic at any time with any questions, concerns, or complaints.   Medication Access Issues: None, will plan to use voucher card to obtain first prescription while application for manufacturer assistance is in process.   Follow-up plan: RTC as scheduled   Thank you for allowing me to participate in the care of this patient.   Time Total: 15 minutes   Visit consisted of counseling and education on dealing with issues of symptom management in the setting of serious and potentially life-threatening illness.Greater than 50% of this time was spent counseling and coordinating care related to the above assessment and plan.  Signed by: Jerry Caras, PharmD PGY2 Oncology Pharmacy Resident   02/28/2023 3:21 PM

## 2023-02-28 NOTE — Progress Notes (Unsigned)
MRI l-spine and sacrum 02/19/23. Back pain 4/10.  Needs refills on tramadol, trazodone and potassium, pended.

## 2023-02-28 NOTE — Telephone Encounter (Signed)
Oral Oncology Patient Advocate Encounter  Prior Authorization for Alicia [name] has been approved.    PA# [id number]  Effective dates: 02/28/23 through 01/15/24  Patients co-pay is $1,928.00.    Ardeen Fillers, CPhT Oncology Pharmacy Patient Advocate  [facility]  [phone] (phone) [phone] (fax) 02/28/2023 3:53 PM

## 2023-02-28 NOTE — Telephone Encounter (Signed)
Oral Oncology Patient Advocate Encounter  New authorization   Received notification that prior authorization for Idelle Jo is required.   PA submitted on 02/28/23  Key [id number]  Status is pending     Ardeen Fillers, CPhT Oncology Pharmacy Patient Advocate  [facility]  [phone] (phone) [phone] (fax) 02/28/2023 3:12 PM

## 2023-03-01 ENCOUNTER — Other Ambulatory Visit (HOSPITAL_COMMUNITY): Payer: Self-pay

## 2023-03-01 ENCOUNTER — Encounter: Payer: Self-pay | Admitting: Oncology

## 2023-03-01 ENCOUNTER — Telehealth: Payer: Self-pay | Admitting: Pharmacy Technician

## 2023-03-01 ENCOUNTER — Other Ambulatory Visit: Payer: Self-pay

## 2023-03-01 ENCOUNTER — Other Ambulatory Visit: Payer: Self-pay | Admitting: Pharmacy Technician

## 2023-03-01 MED ORDER — TRAMADOL HCL 50 MG PO TABS: 50.0000 mg | ORAL_TABLET | Freq: Four times a day (QID) | ORAL | 0 refills | Status: DC | PRN

## 2023-03-01 MED ORDER — TRAZODONE HCL 50 MG PO TABS: 50.0000 mg | ORAL_TABLET | Freq: Every day | ORAL | 2 refills | Status: DC

## 2023-03-01 MED ORDER — POTASSIUM CHLORIDE CRYS ER 20 MEQ PO TBCR: 20.0000 meq | EXTENDED_RELEASE_TABLET | Freq: Two times a day (BID) | ORAL | 3 refills | Status: DC

## 2023-03-01 NOTE — Progress Notes (Signed)
Specialty Pharmacy Initial Fill Coordination Note  Alicia [name] is a 73 y.o. female contacted today regarding refills of specialty medication(s) Ribociclib Succinate (KISQALI 400mg  Daily Dose) .  Patient requested Delivery  on 03/05/23  to verified address [address] [place] [address]   Medication will be filled on 03/04/23.   Patient is aware of $0 copayment.

## 2023-03-01 NOTE — Telephone Encounter (Signed)
Oral Oncology Patient Advocate Encounter   Began application for assistance for [facility] through [employer] patient support.   Application will be submitted upon completion of necessary supporting documentation.   [facility] phone number [phone].   I will continue to check the status until final determination.   Patty Almedia Balls, CPhT Oncology Pharmacy Patient Advocate [facility] Direct Number: [phone] Fax: [phone]

## 2023-03-01 NOTE — Telephone Encounter (Signed)
Oral Oncology Patient Advocate Encounter   Was successful in obtaining a free trial card for Kisqali.  This copay card will make the patients copay $0 for a one month fill of medication.  I have spoken with the patient.    The billing information is as follows and has been shared with [facility].   RxBin: [id number] PCN: [id number] Member ID: [id number] Group ID: [id number]   Jinger Neighbors, CPhT-Adv Oncology Pharmacy Patient Advocate [facility] Direct Number: [phone]  Fax: [phone]

## 2023-03-01 NOTE — Progress Notes (Signed)
Patient education documented in EPIC note on 02/28/23.   One time fill at [facility] (Specialty) using a voucher while PAP is pending.

## 2023-03-01 NOTE — Progress Notes (Signed)
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  INTERVAL HISTORY: Patient returns to clinic today for further evaluation and continued discussion on whether or not to reinitiate treatment.  She continues to have chronic weakness and fatigue, but this has improved.  She also continues to have chronic back pain.  She has a mild peripheral neuropathy, but no other neurologic complaints.  She denies any chest pain, shortness of breath, cough, or hemoptysis.  She denies any nausea, vomiting, constipation, or diarrhea.  She has no urinary complaints.  Patient offers no further specific complaints today.  REVIEW OF SYSTEMS:   Review of Systems  Constitutional:  Positive for malaise/fatigue. Negative for fever and weight loss.  HENT:  Negative for congestion.   Respiratory: Negative.  Negative for cough and shortness of breath.   Cardiovascular: Negative.  Negative for chest pain and leg swelling.  Gastrointestinal: Negative.  Negative for abdominal pain, constipation, diarrhea and nausea.  Genitourinary:  Negative for dysuria, flank pain and urgency.  Musculoskeletal:  Positive for back pain. Negative for joint pain.  Skin: Negative.  Negative for rash.  Neurological:  Positive for tingling, sensory change and weakness. Negative for dizziness, focal weakness and headaches.  Psychiatric/Behavioral:  The patient is not nervous/anxious.     As per HPI.  Otherwise, a complete review of systems is negative.  PAST MEDICAL HISTORY: Past Medical History:  Diagnosis Date   Acute hypoxic respiratory failure (HCC) 11/26/2022   AKI (acute kidney injury) (HCC) 07/18/2022   Anxiety    Breast cancer, left (HCC) 07/2017   Depression    Electrolyte abnormality 07/18/2022   Elevated troponin 02/17/2015   Hypercalcemia 11/26/2022   Hypertension    Hypothyroidism    Personal history of chemotherapy 2019   LEFT mastectomy-chemo before   Personal history of radiation therapy 03/2018   LEFT mastectomy   Rapid heart rate    Thyroid disease     PAST SURGICAL HISTORY: Past Surgical History:  Procedure Laterality Date   AXILLARY LYMPH NODE BIOPSY Left 07/16/2017   METASTATIC MAMMARY CARCINOMA   BREAST BIOPSY Left 07/16/2017   [place] bx of left breast mass and left breast LN.  INVASIVE MAMMARY CARCINOMA, NO SPECIAL TYPE.    BREAST EXCISIONAL BIOPSY Right 2001   benign   BREAST LUMPECTOMY WITH SENTINEL LYMPH NODE BIOPSY Left 12/06/2017   Procedure: BREAST LUMPECTOMY WITH SENTINEL LYMPH NODE BX;  Surgeon: Earline Mayotte, MD;  Location: [facility] ORS;  Service: General;  Laterality: Left;   COLONOSCOPY     MASTECTOMY Left 12/23/2017   PORTACATH PLACEMENT Right 08/07/2017   Procedure: INSERTION PORT-A-CATH;  Surgeon: Earline Mayotte, MD;  Location: [facility] ORS;  Service: General;  Laterality: Right;   SIMPLE MASTECTOMY WITH AXILLARY SENTINEL NODE BIOPSY Left 12/23/2017   T2,N2 with 6/7 nodes positive. Whole breast radiation.  Surgeon: Earline Mayotte, MD;  Location: [facility] ORS;  Service: General;  Laterality: Left;   TONSILLECTOMY      FAMILY HISTORY: Family History  Problem Relation Age of Onset  Stroke Mother    Thyroid disease Mother    Renal Disease Mother    Stroke Father    Heart attack Father    Sudden death Father 33       suicide   Anuerysm Brother    Breast cancer Neg Hx     ADVANCED DIRECTIVES (Y/N):  N  HEALTH  MAINTENANCE: Social History   Tobacco Use   Smoking status: Former    Current packs/day: 0.00    Average packs/day: 1 pack/day for 30.0 years (30.0 ttl pk-yrs)    Types: Cigarettes    Start date: 5    Quit date: 2019    Years since quitting: 6.1   Smokeless tobacco: Never  Vaping Use   Vaping status: Never Used  Substance Use Topics   Alcohol use: Not Currently   Drug use: Never     Colonoscopy:  PAP:  Bone density:  Lipid panel:  Allergies  Allergen Reactions   Sulfa Antibiotics Diarrhea    Current Outpatient Medications  Medication Sig Dispense Refill   acetaminophen (TYLENOL) 500 MG tablet Take 1,000 mg by mouth every 6 (six) hours as needed for moderate pain or headache.      ALPRAZolam (XANAX) 0.5 MG tablet Take 1 tablet (0.5 mg total) by mouth 2 (two) times daily as needed for anxiety. 60 tablet 1   ascorbic acid (VITAMIN C) 500 MG tablet Take 500 mg by mouth daily.     aspirin EC 81 MG tablet Take 81 mg by mouth at bedtime.      Cholecalciferol (VITAMIN D3) 125 MCG (5000 UT) CAPS Take 5,000 Units by mouth 2 (two) times a day.     exemestane (AROMASIN) 25 MG tablet Take 1 tablet (25 mg total) by mouth daily after breakfast. 30 tablet 3   feeding supplement (ENSURE ENLIVE / ENSURE PLUS) LIQD Take 237 mLs by mouth 2 (two) times daily between meals.     levothyroxine (SYNTHROID) 100 MCG tablet Take 1 tablet (100 mcg total) by mouth daily before breakfast.     linaclotide (LINZESS) 145 MCG CAPS capsule Take 145 mcg by mouth daily before breakfast.     loratadine (CLARITIN) 10 MG tablet Take 10 mg by mouth daily. Take to reduce risk of rash from everolimus.     magnesium oxide (MAG-OX) 400 (240 Mg) MG tablet Take 400 mg by mouth daily.     midodrine (PROAMATINE) 10 MG tablet Take 1 tablet (10 mg total) by mouth 3 (three) times daily with meals. (Patient taking differently: Take 10 mg by mouth as needed (low bp).) 90 tablet 1   Multiple Vitamin (MULTIVITAMIN WITH  MINERALS) TABS tablet Take 1 tablet by mouth daily.     ondansetron (ZOFRAN) 8 MG tablet Take 1 tablet (8 mg total) by mouth every 8 (eight) hours as needed. 30 tablet 1   pantoprazole (PROTONIX) 20 MG tablet TAKE 1 TABLET(20 MG) BY MOUTH DAILY 90 tablet 0   potassium chloride SA (KLOR-CON M) 20 MEQ tablet Take 2 tablets (40 mEq total) by mouth 2 (two) times daily. 180 tablet 3   Probiotic Product (ALIGN) 4 MG CAPS Take 1 capsule by mouth daily.     prochlorperazine (COMPAZINE) 10 MG tablet TAKE 1 TABLET(10 MG) BY MOUTH EVERY 6 HOURS AS NEEDED FOR NAUSEA OR VOMITING 30 tablet 2   rosuvastatin (CRESTOR) 5 MG tablet Take 1 tablet (5 mg total) by mouth daily. 90 tablet 3   potassium chloride SA (KLOR-CON M) 20 MEQ  tablet Take 1 tablet (20 mEq total) by mouth 2 (two) times daily. 60 tablet 3   ribociclib succ (KISQALI 400MG  DAILY DOSE) 200 MG Therapy Pack Take 2 tablets (400 mg total) by mouth daily. Take for 21 days on, 7 days off, repeat every 28 days. 42 tablet 0   traMADol (ULTRAM) 50 MG tablet Take 1 tablet (50 mg total) by mouth every 6 (six) hours as needed. 60 tablet 0   traZODone (DESYREL) 50 MG tablet Take 1 tablet (50 mg total) by mouth at bedtime. 30 tablet 2   No current facility-administered medications for this visit.    OBJECTIVE: Vitals:   02/28/23 1341  BP: 114/64  Pulse: 80  Temp: 97.8 F (36.6 C)  SpO2: 96%      Body mass index is 27.28 kg/m.    ECOG FS:1 - Symptomatic but completely ambulatory  General: Well-developed, well-nourished, no acute distress. Eyes: Pink conjunctiva, anicteric sclera. HEENT: Normocephalic, moist mucous membranes. Lungs: No audible wheezing or coughing. Heart: Regular rate and rhythm. Abdomen: Soft, nontender, no obvious distention. Musculoskeletal: No edema, cyanosis, or clubbing. Neuro: Alert, answering all questions appropriately. Cranial nerves grossly intact. Skin: No rashes or petechiae noted. Psych: Normal affect.  LAB  RESULTS:  Lab Results  Component Value Date   NA 133 (L) 02/27/2023   K 4.2 02/27/2023   CL 102 02/27/2023   CO2 22 02/27/2023   GLUCOSE 123 (H) 02/27/2023   BUN 28 (H) 02/27/2023   CREATININE 1.17 (H) 02/27/2023   CALCIUM 9.5 02/27/2023   PROT 7.3 02/27/2023   ALBUMIN 3.5 02/27/2023   AST 39 02/27/2023   ALT 23 02/27/2023   ALKPHOS 165 (H) 02/27/2023   BILITOT 0.6 02/27/2023   GFRNONAA 49 (L) 02/27/2023   GFRAA 57 (L) 10/01/2019    Lab Results  Component Value Date   WBC 7.0 02/27/2023   NEUTROABS 4.6 02/27/2023   HGB 11.0 (L) 02/27/2023   HCT 33.5 (L) 02/27/2023   MCV 87.0 02/27/2023   PLT 313 02/27/2023     STUDIES: No results found.   ONCOLOGY HISTORY: Patient initially received neoadjuvant chemotherapy with Adriamycin and Cytoxan followed by weekly Taxol. She only received 4 cycles of weekly Taxol prior to discontinuation of treatment on October 31, 2017 secondary to persistent peripheral neuropathy.  She ultimately required mastectomy and final pathology noted 5 of 6 lymph nodes positive for disease.  Patient completed adjuvant XRT in mid April 2020.  Pain initiated letrozole in May 2020, this was discontinued secondary to side effects and patient was started on anastrozole.  Nuclear medicine bone scan on June 19, 2019 revealed metastatic disease and anastrozole was subsequently discontinued.  PET scan results from October 02, 2021 revealed no clear evidence of metastatic progression.  PET scan results from June 30, 2019 reviewed independently with metastatic bony disease, but no obvious evidence of visceral disease.  MRI of the brain on August 24, 2019 reviewed independently with no obvious evidence of metastatic disease.   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  Patient's CA  27-29 continues to slowly increase and now is 439.6.  Patient has not received any treatment other than Aromasin in nearly 3 months and treatment over the last 6 months has been intermittent at best.  [name] has been permanently discontinued given her issues with her mandible.  After lengthy discussion with the patient, patient does not wish to restart Afinitor given multiple hospital admissions, but based on the MAINTAIN trial she agreed to rechallenge with ribociclib after previously receiving a CDK 4/6 inhibitor.  Return to clinic 2 weeks after initiating treatment for EKG, laboratory work, and assess her toleration of treatment.  Appreciate clinical pharmacy input.   Constipation: Patient does not complain of this today.  Patient has been instructed she needs a regular daily bowel regimen. Peripheral neuropathy: Chronic and unchanged.   History of pulmonary embolus: Patient was diagnosed with a small pulmonary embolus on January 10, 2018.  She is no longer on anticoagulation.  There was no evidence of recurrent PE on CT scan from October 27, 2019. Osteopenia: Patient's most recent bone mineral density on March 03, 2019 reported a T score of -1.8 which is only mildly decreased from 1 year prior when the reported T score was -1.6.  Continue calcium and vitamin D supplementation.  Patient no longer will receive bisphosphonates.   Left hip/flank pain: MRI of the lumbar spine and sacrum is pending at time of dictation.  Continue tramadol as prescribed.  She has now completed XRT.   Anemia: Chronic and unchanged.  Patient's hemoglobin is 11.0. Renal insufficiency: Chronic and changed.  Patient's creatinine is 1.17.   Hypokalemia: Resolved.  Continue oral potassium supplementation. Dental pathology: Significantly improved.  Continue follow up with Dr. Willaim Bane.  Patient has discontinued hyperbaric treatments at [facility].  No further Xgeva as above. Lymphedema: Chronic and unchanged.  Follow-up with  occupational therapy as scheduled. Coping/anxiety: Continue trazodone at night.  Patient was also given a referral to [facility].  Back pain: MRI pending as above.  Patient expressed understanding and was in agreement with this plan. She also understands that She can call clinic at any time with any questions, concerns, or complaints.    Cancer Staging  Breast cancer, stage 2, left (HCC) Staging form: Breast, AJCC 8th Edition - Clinical stage from 07/30/2017: Stage IIA (cT2, cN1, cM0, G2, ER+, PR+, HER2-) - Signed by Jeralyn Ruths, MD on 07/30/2017 Histologic grading system: 3 grade system Laterality: Left   Jeralyn Ruths, MD   03/01/2023 9:48 AM

## 2023-03-01 NOTE — Telephone Encounter (Signed)
Oral Oncology Patient Advocate Encounter  Reached out and spoke with patient regarding PAP paperwork, explained that I would send it to their preferred email via Doc[employer].   Confirmed email address: [email or web address] .    Patient expressed understanding and consent.  Will follow up once paperwork has been signed and returned.   Patty Almedia Balls, CPhT Oncology Pharmacy Patient Advocate [facility] Direct Number: [phone] Fax: [phone]

## 2023-03-04 ENCOUNTER — Other Ambulatory Visit: Payer: Self-pay

## 2023-03-08 NOTE — Telephone Encounter (Signed)
Oral Oncology Patient Advocate Encounter   Submitted application for assistance for Kisqali to [employer] patient support.   Application submitted via e-fax to [phone]   [employer] patient support phone number [phone].   I will continue to check the status until final determination.   Patty Almedia Balls, CPhT Oncology Pharmacy Patient Advocate [facility] Direct Number: [phone] Fax: [phone]

## 2023-03-12 NOTE — Telephone Encounter (Signed)
 Oral Oncology Patient Advocate Encounter  Called [employer] to get update on application, rep stated that application is progressing but still pending.   I will continue to check on the status of application.  Patty Almedia Balls, CPhT Oncology Pharmacy Patient Advocate [facility] Direct Number: [phone] Fax: [phone]

## 2023-03-14 ENCOUNTER — Encounter: Payer: Self-pay | Admitting: Oncology

## 2023-03-15 NOTE — Telephone Encounter (Signed)
 Received notification via fax that application has been triaged to [employer] for final review and processing and to expect a decision within 5 business days. We will continue to follow and update until final determination.    Ardeen Fillers, CPhT Oncology Pharmacy Patient Advocate  [facility]  [phone] (phone) [phone] (fax) 03/15/2023 8:49 AM

## 2023-03-19 ENCOUNTER — Other Ambulatory Visit: Payer: Self-pay

## 2023-03-19 ENCOUNTER — Other Ambulatory Visit: Payer: Self-pay | Admitting: *Deleted

## 2023-03-19 ENCOUNTER — Encounter: Payer: Self-pay | Admitting: Oncology

## 2023-03-19 ENCOUNTER — Inpatient Hospital Stay: Payer: Medicare Other | Attending: Oncology

## 2023-03-19 ENCOUNTER — Inpatient Hospital Stay (HOSPITAL_BASED_OUTPATIENT_CLINIC_OR_DEPARTMENT_OTHER): Payer: Medicare Other | Admitting: Oncology

## 2023-03-19 ENCOUNTER — Inpatient Hospital Stay: Payer: Medicare Other | Admitting: Pharmacist

## 2023-03-19 VITALS — BP 92/62 | HR 83 | Temp 98.1°F | Resp 18 | Ht 66.0 in | Wt 167.0 lb

## 2023-03-19 DIAGNOSIS — I890 Lymphedema, not elsewhere classified: Secondary | ICD-10-CM | POA: Insufficient documentation

## 2023-03-19 DIAGNOSIS — F419 Anxiety disorder, unspecified: Secondary | ICD-10-CM | POA: Insufficient documentation

## 2023-03-19 DIAGNOSIS — C7951 Secondary malignant neoplasm of bone: Secondary | ICD-10-CM | POA: Insufficient documentation

## 2023-03-19 DIAGNOSIS — D649 Anemia, unspecified: Secondary | ICD-10-CM | POA: Insufficient documentation

## 2023-03-19 DIAGNOSIS — C50911 Malignant neoplasm of unspecified site of right female breast: Secondary | ICD-10-CM | POA: Diagnosis not present

## 2023-03-19 DIAGNOSIS — K5900 Constipation, unspecified: Secondary | ICD-10-CM | POA: Insufficient documentation

## 2023-03-19 DIAGNOSIS — C50912 Malignant neoplasm of unspecified site of left female breast: Secondary | ICD-10-CM

## 2023-03-19 DIAGNOSIS — Z170 Estrogen receptor positive status [ER+]: Secondary | ICD-10-CM | POA: Insufficient documentation

## 2023-03-19 DIAGNOSIS — M549 Dorsalgia, unspecified: Secondary | ICD-10-CM | POA: Diagnosis not present

## 2023-03-19 DIAGNOSIS — G629 Polyneuropathy, unspecified: Secondary | ICD-10-CM | POA: Diagnosis not present

## 2023-03-19 DIAGNOSIS — C50812 Malignant neoplasm of overlapping sites of left female breast: Secondary | ICD-10-CM | POA: Insufficient documentation

## 2023-03-19 DIAGNOSIS — C50919 Malignant neoplasm of unspecified site of unspecified female breast: Secondary | ICD-10-CM

## 2023-03-19 LAB — CMP (CANCER CENTER ONLY)
ALT: 25 U/L (ref 0–44)
AST: 33 U/L (ref 15–41)
Albumin: 3.5 g/dL (ref 3.5–5.0)
Alkaline Phosphatase: 169 U/L — ABNORMAL HIGH (ref 38–126)
Anion gap: 12 (ref 5–15)
BUN: 25 mg/dL — ABNORMAL HIGH (ref 8–23)
CO2: 23 mmol/L (ref 22–32)
Calcium: 9.6 mg/dL (ref 8.9–10.3)
Chloride: 103 mmol/L (ref 98–111)
Creatinine: 1.47 mg/dL — ABNORMAL HIGH (ref 0.44–1.00)
GFR, Estimated: 37 mL/min — ABNORMAL LOW (ref >60)
Glucose, Bld: 101 mg/dL — ABNORMAL HIGH (ref 70–99)
Potassium: 3.9 mmol/L (ref 3.5–5.1)
Sodium: 138 mmol/L (ref 135–145)
Total Bilirubin: 1 mg/dL (ref 0.0–1.2)
Total Protein: 7.1 g/dL (ref 6.5–8.1)

## 2023-03-19 LAB — CBC WITH DIFFERENTIAL/PLATELET
Abs Immature Granulocytes: 0.01 10*3/uL (ref 0.00–0.07)
Basophils Absolute: 0 10*3/uL (ref 0.0–0.1)
Basophils Relative: 0 %
Eosinophils Absolute: 0.1 10*3/uL (ref 0.0–0.5)
Eosinophils Relative: 2 %
HCT: 30.4 % — ABNORMAL LOW (ref 36.0–46.0)
Hemoglobin: 10 g/dL — ABNORMAL LOW (ref 12.0–15.0)
Immature Granulocytes: 0 %
Lymphocytes Relative: 22 %
Lymphs Abs: 0.7 10*3/uL (ref 0.7–4.0)
MCH: 28.7 pg (ref 26.0–34.0)
MCHC: 32.9 g/dL (ref 30.0–36.0)
MCV: 87.4 fL (ref 80.0–100.0)
Monocytes Absolute: 0.2 10*3/uL (ref 0.1–1.0)
Monocytes Relative: 5 %
Neutro Abs: 2.2 10*3/uL (ref 1.7–7.7)
Neutrophils Relative %: 71 %
Platelets: 241 10*3/uL (ref 150–400)
RBC: 3.48 MIL/uL — ABNORMAL LOW (ref 3.87–5.11)
RDW: 15.1 % (ref 11.5–15.5)
Smear Review: NORMAL
WBC: 3.1 10*3/uL — ABNORMAL LOW (ref 4.0–10.5)
nRBC: 0 % (ref 0.0–0.2)

## 2023-03-19 MED ORDER — TRAMADOL HCL 50 MG PO TABS: 50.0000 mg | ORAL_TABLET | Freq: Four times a day (QID) | ORAL | 1 refills | Status: DC | PRN

## 2023-03-19 NOTE — Progress Notes (Signed)
 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  INTERVAL HISTORY: Patient returns to clinic today for repeat laboratory work and to assess her toleration of ribociclib.  She is tolerating treatment well without significant side effects.  She continues to have chronic weakness and fatigue.  She continues to have increased back pain, but this is improved with tramadol.  She has a mild peripheral neuropathy, but no other neurologic complaints.  She denies any chest pain, shortness of breath, cough, or hemoptysis.  She denies any nausea, vomiting, constipation, or diarrhea.  She has no urinary complaints.  Patient offers no further specific complaints today.  REVIEW OF SYSTEMS:   Review of Systems  Constitutional:  Positive for malaise/fatigue. Negative for fever and weight loss.  HENT:  Negative for congestion.   Respiratory: Negative.  Negative for cough and shortness of breath.   Cardiovascular: Negative.  Negative for chest pain and leg swelling.  Gastrointestinal: Negative.  Negative for abdominal pain, constipation, diarrhea and nausea.  Genitourinary:  Negative for dysuria, flank pain and urgency.  Musculoskeletal:  Positive for back pain. Negative for joint pain.  Skin: Negative.  Negative for rash.  Neurological:  Positive for tingling, sensory change and weakness. Negative for dizziness, focal weakness and headaches.  Psychiatric/Behavioral:   The patient is not nervous/anxious.     As per HPI. Otherwise, a complete review of systems is negative.  PAST MEDICAL HISTORY: Past Medical History:  Diagnosis Date   Acute hypoxic respiratory failure (HCC) 11/26/2022   AKI (acute kidney injury) (HCC) 07/18/2022   Anxiety    Breast cancer, left (HCC) 07/2017   Depression    Electrolyte abnormality 07/18/2022   Elevated troponin 02/17/2015   Hypercalcemia 11/26/2022   Hypertension    Hypothyroidism    Personal history of chemotherapy 2019   LEFT mastectomy-chemo before   Personal history of radiation therapy 03/2018   LEFT mastectomy   Rapid heart rate    Thyroid disease     PAST SURGICAL HISTORY: Past Surgical History:  Procedure Laterality Date   AXILLARY LYMPH NODE BIOPSY Left 07/16/2017   METASTATIC MAMMARY CARCINOMA   BREAST BIOPSY Left 07/16/2017   [place] bx of left breast mass and left breast LN.  INVASIVE MAMMARY CARCINOMA, NO SPECIAL TYPE.    BREAST EXCISIONAL BIOPSY Right 2001   benign   BREAST LUMPECTOMY WITH SENTINEL LYMPH NODE BIOPSY Left 12/06/2017   Procedure: BREAST LUMPECTOMY WITH SENTINEL LYMPH NODE BX;  Surgeon: Earline Mayotte, MD;  Location: [facility] ORS;  Service: General;  Laterality: Left;   COLONOSCOPY     MASTECTOMY Left 12/23/2017   PORTACATH PLACEMENT Right 08/07/2017   Procedure: INSERTION PORT-A-CATH;  Surgeon: Earline Mayotte, MD;  Location: [facility] ORS;  Service: General;  Laterality: Right;   SIMPLE MASTECTOMY WITH AXILLARY SENTINEL NODE BIOPSY Left 12/23/2017   T2,N2 with 6/7 nodes positive. Whole breast radiation.  Surgeon: Earline Mayotte, MD;  Location: [facility] ORS;  Service: General;  Laterality: Left;   TONSILLECTOMY      FAMILY HISTORY: Family  History  Problem Relation Age of Onset   Stroke Mother    Thyroid disease Mother    Renal Disease Mother    Stroke Father    Heart attack Father    Sudden death Father 82       suicide   Anuerysm Brother    Breast cancer Neg Hx      ADVANCED DIRECTIVES (Y/N):  N  HEALTH MAINTENANCE: Social History   Tobacco Use   Smoking status: Former    Current packs/day: 0.00    Average packs/day: 1 pack/day for 30.0 years (30.0 ttl pk-yrs)    Types: Cigarettes    Start date: 4    Quit date: 2019    Years since quitting: 6.1   Smokeless tobacco: Never  Vaping Use   Vaping status: Never Used  Substance Use Topics   Alcohol use: Not Currently   Drug use: Never     Colonoscopy:  PAP:  Bone density:  Lipid panel:  Allergies  Allergen Reactions   Sulfa Antibiotics Diarrhea    Current Outpatient Medications  Medication Sig Dispense Refill   acetaminophen (TYLENOL) 500 MG tablet Take 1,000 mg by mouth every 6 (six) hours as needed for moderate pain or headache.      ALPRAZolam (XANAX) 0.5 MG tablet Take 1 tablet (0.5 mg total) by mouth 2 (two) times daily as needed for anxiety. 60 tablet 1   ascorbic acid (VITAMIN C) 500 MG tablet Take 500 mg by mouth daily.     aspirin EC 81 MG tablet Take 81 mg by mouth at bedtime.      Cholecalciferol (VITAMIN D3) 125 MCG (5000 UT) CAPS Take 5,000 Units by mouth 2 (two) times a day.     exemestane (AROMASIN) 25 MG tablet Take 1 tablet (25 mg total) by mouth daily after breakfast. 30 tablet 3   feeding supplement (ENSURE ENLIVE / ENSURE PLUS) LIQD Take 237 mLs by mouth 2 (two) times daily between meals.     levothyroxine (SYNTHROID) 100 MCG tablet Take 1 tablet (100 mcg total) by mouth daily before breakfast.     linaclotide (LINZESS) 145 MCG CAPS capsule Take 145 mcg by mouth daily before breakfast.     loratadine (CLARITIN) 10 MG tablet Take 10 mg by mouth daily. Take to reduce risk of rash from everolimus.     magnesium oxide (MAG-OX) 400 (240 Mg) MG tablet Take 400 mg by mouth daily.     midodrine (PROAMATINE) 10 MG tablet Take 1 tablet (10 mg total) by mouth 3 (three) times daily with meals. (Patient taking differently: Take 10 mg by mouth as needed (low bp).) 90 tablet 1    Multiple Vitamin (MULTIVITAMIN WITH MINERALS) TABS tablet Take 1 tablet by mouth daily.     ondansetron (ZOFRAN) 8 MG tablet Take 1 tablet (8 mg total) by mouth every 8 (eight) hours as needed. 30 tablet 1   pantoprazole (PROTONIX) 20 MG tablet TAKE 1 TABLET(20 MG) BY MOUTH DAILY 90 tablet 0   potassium chloride SA (KLOR-CON M) 20 MEQ tablet Take 2 tablets (40 mEq total) by mouth 2 (two) times daily. 180 tablet 3   potassium chloride SA (KLOR-CON M) 20 MEQ tablet Take 1 tablet (20 mEq total) by mouth 2 (two) times daily. 60 tablet 3   Probiotic Product (ALIGN) 4 MG CAPS Take 1 capsule by mouth daily.     prochlorperazine (COMPAZINE) 10 MG tablet TAKE 1 TABLET(10 MG) BY MOUTH EVERY 6 HOURS AS NEEDED FOR  NAUSEA OR VOMITING 30 tablet 2   ribociclib succ (KISQALI 400MG  DAILY DOSE) 200 MG Therapy Pack Take 2 tablets (400 mg total) by mouth daily. Take for 21 days on, 7 days off, repeat every 28 days. 42 tablet 0   rosuvastatin (CRESTOR) 5 MG tablet Take 1 tablet (5 mg total) by mouth daily. 90 tablet 3   traZODone (DESYREL) 50 MG tablet Take 1 tablet (50 mg total) by mouth at bedtime. 30 tablet 2   traMADol (ULTRAM) 50 MG tablet Take 1 tablet (50 mg total) by mouth every 6 (six) hours as needed. 60 tablet 1   No current facility-administered medications for this visit.    OBJECTIVE: Vitals:   03/19/23 1002  BP: 92/62  Pulse: 83  Resp: 18  Temp: 98.1 F (36.7 C)  SpO2: 99%      Body mass index is 26.95 kg/m.    ECOG FS:1 - Symptomatic but completely ambulatory  General: Well-developed, well-nourished, no acute distress.  Sitting in a wheelchair. Eyes: Pink conjunctiva, anicteric sclera. HEENT: Normocephalic, moist mucous membranes. Lungs: No audible wheezing or coughing. Heart: Regular rate and rhythm. Abdomen: Soft, nontender, no obvious distention. Musculoskeletal: No edema, cyanosis, or clubbing. Neuro: Alert, answering all questions appropriately. Cranial nerves grossly  intact. Skin: No rashes or petechiae noted. Psych: Normal affect.  LAB RESULTS:  Lab Results  Component Value Date   NA 138 03/19/2023   K 3.9 03/19/2023   CL 103 03/19/2023   CO2 23 03/19/2023   GLUCOSE 101 (H) 03/19/2023   BUN 25 (H) 03/19/2023   CREATININE 1.47 (H) 03/19/2023   CALCIUM 9.6 03/19/2023   PROT 7.1 03/19/2023   ALBUMIN 3.5 03/19/2023   AST 33 03/19/2023   ALT 25 03/19/2023   ALKPHOS 169 (H) 03/19/2023   BILITOT 1.0 03/19/2023   GFRNONAA 37 (L) 03/19/2023   GFRAA 57 (L) 10/01/2019    Lab Results  Component Value Date   WBC 3.1 (L) 03/19/2023   NEUTROABS 2.2 03/19/2023   HGB 10.0 (L) 03/19/2023   HCT 30.4 (L) 03/19/2023   MCV 87.4 03/19/2023   PLT 241 03/19/2023     STUDIES: MR Lumbar Spine W Wo Contrast Result Date: 03/03/2023 CLINICAL DATA:  Low back pain, metastatic breast cancer EXAM: MRI LUMBAR SPINE WITHOUT AND WITH CONTRAST TECHNIQUE: Multiplanar and multiecho pulse sequences of the lumbar spine were obtained without and with intravenous contrast. CONTRAST:  7.3mL GADAVIST GADOBUTROL 1 MMOL/ML IV SOLN COMPARISON:  CT scan 11/21/2022 FINDINGS: Segmentation: The lowest lumbar type non-rib-bearing vertebra is labeled as L5. Alignment:  No vertebral subluxation is observed. Vertebrae: Interval 50% compression fracture T12 involving the superior and inferior endplates. Along the superior endplate there is extension to the posterior vertebral body margin indicating middle column involvement, with 4 mm of posterior bony retropulsion as shown on image 8 series 5. Known metastatic lesion at T12 is obscured by surrounding marrow edema. Interval superior endplate compression fracture at L1 with 15% loss of intervertebral disc height. Known underlying metastatic lesion at this level is obscured by surrounding marrow edema. The fracture involves the middle column but no significant posterior bony retropulsion. Interval 25% compression fracture at L2 with superior and  inferior endplate involvement. None metastatic disease at the L2 level is obscured by surrounding marrow edema. No significant posterior bony retropulsion. Scattered metastatic lesions observed at L3, L4, L5, and in the upper sacrum. Tumor involves the left L4 and L5 pedicles as well as the right L3 and L4 pedicles.  No well-defined epidural tumor observed in the lumbar spine. Conus medullaris and cauda equina: Conus extends to the L1 level. Mild clumping of nerve roots below the conus at about the L1-2 level, cannot exclude arachnoiditis. I do not see enhancing tumor in this vicinity to necessarily indicate tumor along the cauda equina. Paraspinal and other soft tissues: In addition to a 5.7 cm left renal cyst which is simple in its visualized portion, there is a complex 1.7 by 1.2 cm left mid kidney lesion posteriorly with intermediate to high T2 signal characteristics and intermediate to low T1 signal characteristics. This was photopenic on prior PET-CT and is not definitively enhancing today. Previous density 13 Hounsfield units on 11/21/2022. Accordingly this is likely a benign but complex Bosniak category 2 cyst. Bilateral sacral insufficiency fractures are observed. Disc levels: T11-12: No impingement. Posterior bony retropulsion from the superior endplate of T12. T12-L1: Unremarkable L1-2: No impingement. Disc bulge and mild degenerative facet arthropathy. L2-3: Borderline central narrowing of the thecal sac due to disc bulge and facet arthropathy. L3-4: Moderate central narrowing of the thecal sac and mild bilateral subarticular lateral recess stenosis due to disc bulge and facet arthropathy. L4-5: Moderate central narrowing of the thecal sac and mild bilateral subarticular lateral recess stenosis due to disc bulge and facet arthropathy. L5-S1: Borderline bilateral subarticular lateral recess stenosis due to facet arthropathy and mild disc bulge. IMPRESSION: 1. Interval 50% compression fracture at T12 with  middle column involvement and 4 mm of posterior bony retropulsion. 2. Interval 25% compression fracture at L2 with superior and inferior endplate involvement. 3. Interval 15% superior endplate compression fracture at L1. 4. Scattered metastatic lesions at L3, L4, L5, and in the upper sacrum. Tumor involves the left L4 and L5 pedicles as well as the right L3 and L4 pedicles. 5. Bilateral sacral insufficiency fractures. 6. Lumbar spondylosis and degenerative disc disease causing moderate impingement at L3-4 and L4-5 and borderline impingement at L2-3 and L5-S1. 7. Mild clumping of nerve roots below the conus at about the L1-2 level, cannot exclude arachnoiditis. I do not see enhancing tumor in this vicinity to necessarily indicate tumor along the cauda equina. 8. Complex 1.7 by 1.2 cm left mid kidney lesion posteriorly, likely a benign but complex Bosniak category 2 cyst. No further imaging workup of this lesion is indicated. Electronically Signed   By: Gaylyn Rong M.D.   On: 03/03/2023 11:51   MR SACRUM SI JOINTS W WO CONTRAST Result Date: 03/03/2023 CLINICAL DATA:  Low back pain and weakness, metastatic breast cancer. EXAM: MRI PELVIS WITHOUT AND WITH CONTRAST TECHNIQUE: Multiplanar multisequence MR imaging of the pelvis was performed using the sacrum protocol both before and after administration of intravenous contrast. CONTRAST:  7.89mL GADAVIST GADOBUTROL 1 MMOL/ML IV SOLN COMPARISON:  CT pelvis 11/21/2022 FINDINGS: Bones:Scattered osseous metastatic lesions in the left L5 vertebral body and pedicle; bilateral sacrum; bilateral iliac bones,; right acetabulum; and bilateral L4 pedicles. Index lesion in the left iliac bone measures 2.4 by 2.1 cm on image 10 series 2. These lesions demonstrate high T2 and low T1 signal characteristics and generally diffuse enhancement. Bilateral sacral insufficiency fractures are present. The right sacral insufficiency fracture partially intersects a 2.0 by 2.1 cm right  sacral metastatic lesion on image 10 series 2. The insufficiency fractures primarily extend vertically in the sacral ala, a prominent transverse component is not identified. However, there is a midline metastatic lesion in the sacrum at the S3 level measuring 1.9 by 1.0 by 1.7 cm.  I do not observe definite extraosseous tumor within the sacral spinal canal. Lower lumbar levels are to be discussed in the dedicated lumbar spine MRI. No SI joint effusion or diastasis. Muscles and tendons Edema signal is observed in the partially visualized left iliacus, left gluteus minimus, and left gluteus medius muscles. There is some adjacent substantial irregularity of the left iliac bone from metastatic disease. Other findings The dominant right upper sacral metastatic lesion is adjacent 2 but do not demonstrably abut or involve the right sacral plexus example on image 9 series 2. Proximal sciatic nerves unremarkable. IMPRESSION: 1. Scattered osseous metastatic lesions in the pelvis and lower lumbar spine. 2. Bilateral sacral insufficiency fractures. 3. Edema signal in the partially visualized left iliacus, left gluteus minimus, and left gluteus medius muscles; substantial adjacent left iliac bone metastatic disease only partially included on this exam which was focused on the sacrum. Electronically Signed   By: Gaylyn Rong M.D.   On: 03/03/2023 11:39     ONCOLOGY HISTORY: Patient initially received neoadjuvant chemotherapy with Adriamycin and Cytoxan followed by weekly Taxol. She only received 4 cycles of weekly Taxol prior to discontinuation of treatment on October 31, 2017 secondary to persistent peripheral neuropathy.  She ultimately required mastectomy and final pathology noted 5 of 6 lymph nodes positive for disease.  Patient completed adjuvant XRT in mid April 2020.  Pain initiated letrozole in May 2020, this was discontinued secondary to side effects and patient was started on anastrozole.  Nuclear medicine bone  scan on June 19, 2019 revealed metastatic disease and anastrozole was subsequently discontinued.  PET scan results from October 02, 2021 revealed no clear evidence of metastatic progression.  PET scan results from June 30, 2019 reviewed independently with metastatic bony disease, but no obvious evidence of visceral disease.  MRI of the brain on August 24, 2019 reviewed independently with no obvious evidence of metastatic disease.   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  Patient's CA 27-29 continues to slowly increase and now is 439.6.  Patient has not received any treatment other than Aromasin in nearly 3 months and treatment over the last 6 months has been intermittent at best.  [name] has been permanently discontinued given her issues with her mandible.  After lengthy discussion with the patient, patient did not wish to restart Afinitor given multiple hospital admissions, but based on the MAINTAIN trial she agreed to rechallenge with ribociclib after previously receiving a CDK 4/6 inhibitor.  Continue with treatment for 21 days with 7 days off.  Return to clinic in 2 weeks for further evaluation and consideration of cycle 2.  Appreciate clinical pharmacy input.   Constipation: Patient does not complain of this today.  Patient has been instructed she needs a regular daily bowel regimen. Peripheral neuropathy: Chronic and unchanged. History of pulmonary embolus: Patient was diagnosed with a small pulmonary embolus on January 10, 2018.  She is no longer on anticoagulation.  There was no evidence of recurrent PE on CT scan from October 27, 2019. Osteopenia: Patient's most recent bone mineral density on March 03, 2019 reported a T score of -1.8 which is only mildly decreased from 1 year prior when the reported T  score was -1.6.  Continue calcium and vitamin D supplementation.  Patient no longer will receive bisphosphonates.   Left hip/flank pain: MRI of  the lumbar and sacral spine revealed multiple compression and insufficiency fractures along with metastatic disease.  Referral sent back to radiation oncology to see if XRT is a possibility.  Continue tramadol as prescribed.  Can also consider adding in narcotics if needed.   Anemia: Hemoglobin is down to 10.0, monitor. Leukopenia: Mild, monitor. Renal insufficiency: Creatinine slightly worse up to 1.47.  Monitor. Hypokalemia: Resolved.  Continue oral potassium supplementation. Dental pathology: Significantly improved.  Continue follow up with Dr. Willaim Bane.  Patient has discontinued hyperbaric treatments at [facility].  No further Xgeva as above. Lymphedema: Chronic and unchanged.  Follow-up with occupational therapy as scheduled. Coping/anxiety: Continue trazodone at night.  Patient was also given a referral to [facility].  Back pain: Tramadol XRT as above.   Patient expressed understanding and was in agreement with this plan. She also understands that She can call clinic at any time with any questions, concerns, or complaints.    Cancer Staging  Breast cancer, stage 2, left (HCC) Staging form: Breast, AJCC 8th Edition - Clinical stage from 07/30/2017: Stage IIA (cT2, cN1, cM0, G2, ER+, PR+, HER2-) - Signed by Jeralyn Ruths, MD on 07/30/2017 Histologic grading system: 3 grade system Laterality: Left   Jeralyn Ruths, MD   03/19/2023 1:16 PM

## 2023-03-19 NOTE — Progress Notes (Signed)
 Having really bad back pain. Tramdol pended for refills. Review results from recent MRI.

## 2023-03-19 NOTE — Progress Notes (Signed)
 Clinical Pharmacist Practitioner Clinic [facility]  [phone] [phone]  [name], [name], [name], [name] Jim Like, RN as Registered Nurse Orlie Dakin Tollie Pizza, MD as Consulting Physician (Oncology) Carmina Miller, MD as Referring Physician (Radiation Oncology) Chriss Driver, RN as Registered Nurse   Name of the patient: Alicia [name]  [id number]  18-Nov-1950   Date of visit: 03/19/23  HPI: Patient is a 73 y.o. female with metastatic ER+ HER2- breast cancer. Previously treated with palbociclib, but treatment was changed to Afinitor (everolimus) and exemestane due to disease progression 03/2022. Everolimus 10 mg held on 05/22/22 due to potential drug rash, patient started on steroid taper. Rash cleared and patient resumed everolimus 10 mg on 05/31/22. Patient called to report rash return on 06/04/22. Treatment was again held and prescription for hydrocortisone 2.5% cream sent in. Patient resumed everolimus 7.5mg  on 06/18/22 along with loratidine. Patient was admitted to [facility] hospital for altered mental status, dehydration, and weakness on 07/18/22, everolimus and exemestane were held with admission and at discharge. Patient resumed everolimus (at 5mg ) and exemestane on 09/20/22. Everolimus was again held on 10/05/22 due to weakness and fatigue, everolimus 5mg  was resumed on 10/08/22. Everolimus held again on 11/10/22. Patient was in the hospital 11/26/22-12/03/22 for pneumonia. Patient started ribociclib on 03/05/23.   Reason for Consult: Oral chemotherapy follow-up for ribociclib therapy.   PAST MEDICAL HISTORY: Past Medical History:  Diagnosis Date   Acute hypoxic respiratory failure (HCC) 11/26/2022   AKI (acute kidney injury) (HCC) 07/18/2022   Anxiety    Breast cancer, left (HCC) 07/2017   Depression    Electrolyte abnormality 07/18/2022    Elevated troponin 02/17/2015   Hypercalcemia 11/26/2022   Hypertension    Hypothyroidism    Personal history of chemotherapy 2019   LEFT mastectomy-chemo before   Personal history of radiation therapy 03/2018   LEFT mastectomy   Rapid heart rate    Thyroid disease     HEMATOLOGY/ONCOLOGY HISTORY:  Oncology History Overview Note  Patient is a 73 year old female who recently self palpated a mass on her left breast.  Subsequent imaging and biopsy revealed the above-stated breast cancer.  Case was also discussed extensively at case conference.  Given the size and the stage of patient's malignancy, she will benefit from neoadjuvant chemotherapy using Adriamycin, Cytoxan, and Taxol.  Patient will also require Neulasta support.  Will get CT scan of the chest, abdomen, and pelvis to assess for any metastatic disease.  Patient will also require port placement and MUGA prior to initiating treatment  CT abdomen/pelvis/chest did not reveal any suspicious lesions concerning for metastatic disease. (08/01/17)  Port-A-Cath placed on 08/07/2017.  Cycle 1 day 1 of AC was given on 08/08/17.     Breast cancer, stage 2, left (HCC)  07/24/2017 Initial Diagnosis   Breast cancer, stage 2, left (HCC)   07/30/2017 Cancer Staging   Staging form: Breast, AJCC 8th Edition - Clinical stage from 07/30/2017: Stage IIA (cT2, cN1, cM0, G2, ER+, PR+, HER2-) - Signed by Jeralyn Ruths, MD on 07/30/2017   08/08/2017 - 10/31/2017 Chemotherapy   The patient had DOXOrubicin (ADRIAMYCIN) chemo injection 130 mg, 60 mg/m2 = 130 mg, Intravenous,  Once, 4 of 4 cycles Administration: 130 mg (08/08/2017), 130 mg (08/22/2017), 130 mg (09/05/2017), 130 mg (09/19/2017) palonosetron (ALOXI) injection 0.25 mg, 0.25 mg, Intravenous,  Once, 4 of 4 cycles Administration: 0.25 mg (08/08/2017), 0.25  mg (08/22/2017), 0.25 mg (09/05/2017), 0.25 mg (09/19/2017) pegfilgrastim-cbqv (UDENYCA) injection 6 mg, 6 mg, Subcutaneous, Once, 4 of 4  cycles Administration: 6 mg (08/09/2017), 6 mg (08/23/2017), 6 mg (09/06/2017), 6 mg (09/20/2017) cyclophosphamide (CYTOXAN) 1,300 mg in sodium chloride 0.9 % 250 mL chemo infusion, 600 mg/m2 = 1,300 mg, Intravenous,  Once, 4 of 4 cycles Administration: 1,300 mg (08/08/2017), 1,300 mg (08/22/2017), 1,300 mg (09/05/2017), 1,300 mg (09/19/2017) PACLitaxel (TAXOL) 174 mg in sodium chloride 0.9 % 250 mL chemo infusion (</= 80mg /m2), 80 mg/m2 = 174 mg, Intravenous,  Once, 4 of 12 cycles Dose modification: 72 mg/m2 (original dose 80 mg/m2, Cycle 6, Reason: Dose not tolerated) Administration: 174 mg (10/03/2017), 156 mg (10/17/2017), 156 mg (10/24/2017), 156 mg (10/31/2017) fosaprepitant (EMEND) 150 mg, dexamethasone (DECADRON) 12 mg in sodium chloride 0.9 % 145 mL IVPB, , Intravenous,  Once, 4 of 4 cycles Administration:  (08/08/2017),  (08/22/2017),  (09/05/2017),  (09/19/2017)  for chemotherapy treatment.      ALLERGIES:  is allergic to sulfa antibiotics.  MEDICATIONS:  Current Outpatient Medications  Medication Sig Dispense Refill   acetaminophen (TYLENOL) 500 MG tablet Take 1,000 mg by mouth every 6 (six) hours as needed for moderate pain or headache.      ALPRAZolam (XANAX) 0.5 MG tablet Take 1 tablet (0.5 mg total) by mouth 2 (two) times daily as needed for anxiety. 60 tablet 1   ascorbic acid (VITAMIN C) 500 MG tablet Take 500 mg by mouth daily.     aspirin EC 81 MG tablet Take 81 mg by mouth at bedtime.      Cholecalciferol (VITAMIN D3) 125 MCG (5000 UT) CAPS Take 5,000 Units by mouth 2 (two) times a day.     exemestane (AROMASIN) 25 MG tablet Take 1 tablet (25 mg total) by mouth daily after breakfast. 30 tablet 3   feeding supplement (ENSURE ENLIVE / ENSURE PLUS) LIQD Take 237 mLs by mouth 2 (two) times daily between meals.     levothyroxine (SYNTHROID) 100 MCG tablet Take 1 tablet (100 mcg total) by mouth daily before breakfast.     linaclotide (LINZESS) 145 MCG CAPS capsule Take 145 mcg by mouth daily  before breakfast.     loratadine (CLARITIN) 10 MG tablet Take 10 mg by mouth daily. Take to reduce risk of rash from everolimus.     magnesium oxide (MAG-OX) 400 (240 Mg) MG tablet Take 400 mg by mouth daily.     midodrine (PROAMATINE) 10 MG tablet Take 1 tablet (10 mg total) by mouth 3 (three) times daily with meals. (Patient taking differently: Take 10 mg by mouth as needed (low bp).) 90 tablet 1   Multiple Vitamin (MULTIVITAMIN WITH MINERALS) TABS tablet Take 1 tablet by mouth daily.     ondansetron (ZOFRAN) 8 MG tablet Take 1 tablet (8 mg total) by mouth every 8 (eight) hours as needed. 30 tablet 1   pantoprazole (PROTONIX) 20 MG tablet TAKE 1 TABLET(20 MG) BY MOUTH DAILY 90 tablet 0   potassium chloride SA (KLOR-CON M) 20 MEQ tablet Take 2 tablets (40 mEq total) by mouth 2 (two) times daily. 180 tablet 3   potassium chloride SA (KLOR-CON M) 20 MEQ tablet Take 1 tablet (20 mEq total) by mouth 2 (two) times daily. 60 tablet 3   Probiotic Product (ALIGN) 4 MG CAPS Take 1 capsule by mouth daily.     prochlorperazine (COMPAZINE) 10 MG tablet TAKE 1 TABLET(10 MG) BY MOUTH EVERY 6 HOURS AS NEEDED FOR NAUSEA OR  VOMITING 30 tablet 2   ribociclib succ (KISQALI 400MG  DAILY DOSE) 200 MG Therapy Pack Take 2 tablets (400 mg total) by mouth daily. Take for 21 days on, 7 days off, repeat every 28 days. 42 tablet 0   rosuvastatin (CRESTOR) 5 MG tablet Take 1 tablet (5 mg total) by mouth daily. 90 tablet 3   traMADol (ULTRAM) 50 MG tablet Take 1 tablet (50 mg total) by mouth every 6 (six) hours as needed. 60 tablet 1   traZODone (DESYREL) 50 MG tablet Take 1 tablet (50 mg total) by mouth at bedtime. 30 tablet 2   No current facility-administered medications for this visit.    VITAL SIGNS: There were no vitals taken for this visit. There were no vitals filed for this visit.  Estimated body mass index is 26.95 kg/m as calculated from the following:   Height as of an earlier encounter on 03/19/23: 5\' 6"  (1.676  m).   Weight as of an earlier encounter on 03/19/23: 75.8 kg (167 lb).  LABS: CBC:    Component Value Date/Time   WBC 3.1 (L) 03/19/2023 0956   HGB 10.0 (L) 03/19/2023 0956   HGB 10.5 (L) 10/08/2022 0902   HGB 14.2 02/11/2011 1349   HCT 30.4 (L) 03/19/2023 0956   HCT 40.9 02/11/2011 1349   PLT 241 03/19/2023 0956   PLT 249 10/08/2022 0902   PLT 151 02/11/2011 1349   MCV 87.4 03/19/2023 0956   MCV 89 02/11/2011 1349   NEUTROABS 2.2 03/19/2023 0956   LYMPHSABS 0.7 03/19/2023 0956   MONOABS 0.2 03/19/2023 0956   EOSABS 0.1 03/19/2023 0956   BASOSABS 0.0 03/19/2023 0956   Comprehensive Metabolic Panel:    Component Value Date/Time   NA 138 03/19/2023 0957   NA 142 02/11/2011 1349   K 3.9 03/19/2023 0957   K 3.8 02/11/2011 1349   CL 103 03/19/2023 0957   CL 107 02/11/2011 1349   CO2 23 03/19/2023 0957   CO2 24 02/11/2011 1349   BUN 25 (H) 03/19/2023 0957   BUN 14 02/11/2011 1349   CREATININE 1.47 (H) 03/19/2023 0957   CREATININE 0.65 02/11/2011 1349   GLUCOSE 101 (H) 03/19/2023 0957   GLUCOSE 98 02/11/2011 1349   CALCIUM 9.6 03/19/2023 0957   CALCIUM 9.0 02/11/2011 1349   AST 33 03/19/2023 0957   ALT 25 03/19/2023 0957   ALT 36 02/11/2011 1349   ALKPHOS 169 (H) 03/19/2023 0957   ALKPHOS 122 02/11/2011 1349   BILITOT 1.0 03/19/2023 0957   PROT 7.1 03/19/2023 0957   PROT 7.0 02/11/2011 1349   ALBUMIN 3.5 03/19/2023 0957   ALBUMIN 4.1 02/11/2011 1349     Present during today's visit: patient and her son  Assessment and Plan: CMP/CBC reviewed, continue ribociclib 400mg  21on/7off Overall patient is feeling well since starting the ribociclib and seems to be tolerating   ECG showed QTc wnl, patient will need another one checked in 2 weeks. If QTc is wnl then, that would be the last one unless clinically indicated    Oral Chemotherapy Side Effect/Intolerance:  Fatigue: Patient reports brief fatigue requiring a nap a few hours after taking her ribociclib. Suggested  patient move her ribociclib to the evening so that the tired happens closer to bedtime. Hopefully this will help to avoid daytime tiredness  No reported rash, diarrhea, or nausea  Oral Chemotherapy Adherence: No mised doses reported Ms. Amey patient barriers to medication adherence identified.   New medications: None reported  Medication Access  Issues: PAP still pending   Patient expressed understanding and was in agreement with this plan. She also understands that She can call clinic at any time with any questions, concerns, or complaints.   Follow-up plan: RTC as scheduled  Thank you for allowing me to participate in the care of this very pleasant patient.   Time Total: 15 mins  Visit consisted of counseling and education on dealing with issues of symptom management in the setting of serious and potentially life-threatening illness.Greater than 50%  of this time was spent counseling and coordinating care related to the above assessment and plan.  Signed by: Remi Haggard, PharmD, Nolon Bussing, CPP Hematology/Oncology Clinical Pharmacist Practitioner [place] [phone]  03/19/2023 11:51 AM

## 2023-03-20 LAB — CANCER ANTIGEN 27.29: CA 27.29: 598.4 U/mL — ABNORMAL HIGH (ref 0.0–38.6)

## 2023-03-21 ENCOUNTER — Other Ambulatory Visit (HOSPITAL_COMMUNITY): Payer: Self-pay

## 2023-03-21 DIAGNOSIS — C50412 Malignant neoplasm of upper-outer quadrant of left female breast: Secondary | ICD-10-CM | POA: Diagnosis not present

## 2023-03-21 DIAGNOSIS — C7951 Secondary malignant neoplasm of bone: Secondary | ICD-10-CM | POA: Diagnosis not present

## 2023-03-21 DIAGNOSIS — Z170 Estrogen receptor positive status [ER+]: Secondary | ICD-10-CM | POA: Diagnosis not present

## 2023-03-21 NOTE — Telephone Encounter (Addendum)
 Oral Oncology Patient Advocate Encounter  Received fax stating "incomplete application due to no household size information."  I have re-faxed application and included that pt has a household size of 2.  [employer]  [phone] [phone]  I will continue to check on status of application.  Patty Almedia Balls, CPhT Oncology Pharmacy Patient Advocate [facility] Direct Number: [phone] Fax: [phone]

## 2023-03-22 ENCOUNTER — Other Ambulatory Visit (HOSPITAL_COMMUNITY): Payer: Self-pay

## 2023-03-25 ENCOUNTER — Ambulatory Visit
Admission: RE | Admit: 2023-03-25 | Discharge: 2023-03-25 | Disposition: A | Discharge status: Home / Self Care | Source: Ambulatory Visit | Attending: Radiation Oncology | Admitting: Radiation Oncology

## 2023-03-25 DIAGNOSIS — C50912 Malignant neoplasm of unspecified site of left female breast: Secondary | ICD-10-CM | POA: Insufficient documentation

## 2023-03-25 DIAGNOSIS — C50919 Malignant neoplasm of unspecified site of unspecified female breast: Secondary | ICD-10-CM | POA: Diagnosis present

## 2023-03-25 DIAGNOSIS — S32019A Unspecified fracture of first lumbar vertebra, initial encounter for closed fracture: Secondary | ICD-10-CM | POA: Insufficient documentation

## 2023-03-25 DIAGNOSIS — K5730 Diverticulosis of large intestine without perforation or abscess without bleeding: Secondary | ICD-10-CM | POA: Diagnosis not present

## 2023-03-25 DIAGNOSIS — C7951 Secondary malignant neoplasm of bone: Secondary | ICD-10-CM | POA: Diagnosis not present

## 2023-03-25 DIAGNOSIS — I700 Atherosclerosis of aorta: Secondary | ICD-10-CM | POA: Diagnosis not present

## 2023-03-25 DIAGNOSIS — S32029A Unspecified fracture of second lumbar vertebra, initial encounter for closed fracture: Secondary | ICD-10-CM | POA: Diagnosis not present

## 2023-03-25 DIAGNOSIS — M8458XA Pathological fracture in neoplastic disease, other specified site, initial encounter for fracture: Secondary | ICD-10-CM | POA: Insufficient documentation

## 2023-03-25 LAB — GLUCOSE, CAPILLARY: Glucose-Capillary: 81 mg/dL (ref 70–99)

## 2023-03-25 MED ORDER — FLUDEOXYGLUCOSE F - 18 (FDG) INJECTION
9.0500 | Freq: Once | INTRAVENOUS | Status: AC | PRN
  Administered 2023-03-25: 9.05 via INTRAVENOUS

## 2023-03-25 NOTE — Telephone Encounter (Addendum)
 Oral Oncology Patient Advocate Encounter   Received notification that the application for assistance for Kisqali through [employer] has been approved.   NPAF phone number [phone].   Effective dates: 03/25/2023 through 01/15/2024  Medication will be filled at [facility].  [employer]PA[employer] phone number: [phone]  Ella Bodo, CPhT Oncology Pharmacy Patient Advocate [facility] Direct Number: [phone] Fax: [phone]

## 2023-04-01 ENCOUNTER — Ambulatory Visit
Admission: RE | Admit: 2023-04-01 | Discharge: 2023-04-01 | Disposition: A | Discharge status: Home / Self Care | Source: Ambulatory Visit | Attending: Radiation Oncology | Admitting: Radiation Oncology

## 2023-04-01 ENCOUNTER — Ambulatory Visit
Admission: RE | Admit: 2023-04-01 | Discharge: 2023-04-01 | Disposition: A | Discharge status: Home / Self Care | Source: Ambulatory Visit | Admitting: Radiation Oncology

## 2023-04-01 DIAGNOSIS — C7951 Secondary malignant neoplasm of bone: Secondary | ICD-10-CM | POA: Diagnosis not present

## 2023-04-01 DIAGNOSIS — C50412 Malignant neoplasm of upper-outer quadrant of left female breast: Secondary | ICD-10-CM | POA: Diagnosis not present

## 2023-04-01 DIAGNOSIS — C50911 Malignant neoplasm of unspecified site of right female breast: Secondary | ICD-10-CM | POA: Insufficient documentation

## 2023-04-01 DIAGNOSIS — Z510 Encounter for antineoplastic radiation therapy: Secondary | ICD-10-CM | POA: Diagnosis not present

## 2023-04-01 DIAGNOSIS — C50919 Malignant neoplasm of unspecified site of unspecified female breast: Secondary | ICD-10-CM

## 2023-04-01 DIAGNOSIS — Z170 Estrogen receptor positive status [ER+]: Secondary | ICD-10-CM | POA: Diagnosis not present

## 2023-04-01 NOTE — Progress Notes (Signed)
 Radiation Oncology Follow up Note   old patient new area progressive bone metastasis and patient with stage IV breast cancer  Name: Alicia [name]   Date:   04/01/2023 MRN:  [id number] DOB: 1950-02-17    This 73 y.o. female presents to the clinic today for reevaluation of progressive metastatic disease especially of the left pelvis left hip and patient previously treated for palliative benefit for stage IV breast cancer.  REFERRING PROVIDER: Corky Downs, MD  HPI: Patient is a 73 year old female well-known to department having received palliative radiation therapy to her left iliac wing back in 2020.  She also received left chest wall and peripheral lymphatic radiation therapy after neoadjuvant chemotherapy and then left modified radical mastectomy..  She has received multiple courses of palliative radiation therapy and a year ago received palliative radiation therapy again to her left hip.  She recently had a repeat PET CT scan and note of oh has not been formally read shows again progressive disease in her left pelvis sacrum and hip.  She complains of increasing pain in this region and difficulty ambulating and she was referred back for consideration of further palliative treatment.  COMPLICATIONS OF TREATMENT: none  FOLLOW UP COMPLIANCE: keeps appointments   PHYSICAL EXAM:  There were no vitals taken for this visit. Range of motion of her lower extremities does not elicit pain.  Motor and sensory levels are equal and symmetric in lower extremities deep palpation of her spine does not elicit pain.  Well-developed well-nourished patient in NAD. HEENT reveals PERLA, EOMI, discs not visualized.  Oral cavity is clear. No oral mucosal lesions are identified. Neck is clear without evidence of cervical or supraclavicular adenopathy. Lungs are clear to A&P. Cardiac examination is essentially unremarkable with regular rate and rhythm without murmur rub or thrill. Abdomen is benign with no organomegaly  or masses noted. Motor sensory and DTR levels are equal and symmetric in the upper and lower extremities. Cranial nerves II through XII are grossly intact. Proprioception is intact. No peripheral adenopathy or edema is identified. No motor or sensory levels are noted. Crude visual fields are within normal range.  RADIOLOGY RESULTS: PET CT scan reviewed compatible with above-stated findings  PLAN: This molecular head with further palliative radiation therapy.  Will plan on delivering 30 Gray in 10 fractions.  Will use PET/CT fusion study to try to incorporate as much areas of tumor involvement as possible.  Risks and benefits of treatment including increased lower urinary tract symptoms possible diarrhea fatigue alteration blood counts skin reaction all were reviewed again with the patient.  We simulated her today.  Patient comprehends my recommendations well and has consented to treatment.  I would like to take this opportunity to thank you for allowing me to participate in the care of your patient.Carmina Miller, MD

## 2023-04-02 ENCOUNTER — Encounter: Payer: Self-pay | Admitting: Oncology

## 2023-04-02 ENCOUNTER — Inpatient Hospital Stay (HOSPITAL_BASED_OUTPATIENT_CLINIC_OR_DEPARTMENT_OTHER): Admitting: Oncology

## 2023-04-02 ENCOUNTER — Other Ambulatory Visit: Payer: Self-pay

## 2023-04-02 ENCOUNTER — Other Ambulatory Visit: Payer: Self-pay | Admitting: *Deleted

## 2023-04-02 ENCOUNTER — Inpatient Hospital Stay: Admitting: Pharmacist

## 2023-04-02 ENCOUNTER — Inpatient Hospital Stay

## 2023-04-02 VITALS — HR 84 | Temp 96.9°F | Resp 16 | Ht 66.0 in | Wt 169.0 lb

## 2023-04-02 DIAGNOSIS — D649 Anemia, unspecified: Secondary | ICD-10-CM | POA: Diagnosis not present

## 2023-04-02 DIAGNOSIS — C50912 Malignant neoplasm of unspecified site of left female breast: Secondary | ICD-10-CM

## 2023-04-02 DIAGNOSIS — C50911 Malignant neoplasm of unspecified site of right female breast: Secondary | ICD-10-CM

## 2023-04-02 DIAGNOSIS — M545 Low back pain, unspecified: Secondary | ICD-10-CM | POA: Diagnosis not present

## 2023-04-02 DIAGNOSIS — C50812 Malignant neoplasm of overlapping sites of left female breast: Secondary | ICD-10-CM | POA: Diagnosis not present

## 2023-04-02 DIAGNOSIS — Z170 Estrogen receptor positive status [ER+]: Secondary | ICD-10-CM | POA: Diagnosis not present

## 2023-04-02 DIAGNOSIS — K5900 Constipation, unspecified: Secondary | ICD-10-CM | POA: Diagnosis not present

## 2023-04-02 DIAGNOSIS — Z510 Encounter for antineoplastic radiation therapy: Secondary | ICD-10-CM | POA: Diagnosis not present

## 2023-04-02 DIAGNOSIS — C7951 Secondary malignant neoplasm of bone: Secondary | ICD-10-CM | POA: Diagnosis not present

## 2023-04-02 DIAGNOSIS — I890 Lymphedema, not elsewhere classified: Secondary | ICD-10-CM | POA: Diagnosis not present

## 2023-04-02 DIAGNOSIS — C50412 Malignant neoplasm of upper-outer quadrant of left female breast: Secondary | ICD-10-CM | POA: Diagnosis not present

## 2023-04-02 LAB — CBC WITH DIFFERENTIAL/PLATELET
Abs Immature Granulocytes: 0.01 10*3/uL (ref 0.00–0.07)
Basophils Absolute: 0 10*3/uL (ref 0.0–0.1)
Basophils Relative: 1 %
Eosinophils Absolute: 0 10*3/uL (ref 0.0–0.5)
Eosinophils Relative: 2 %
HCT: 31.1 % — ABNORMAL LOW (ref 36.0–46.0)
Hemoglobin: 10.1 g/dL — ABNORMAL LOW (ref 12.0–15.0)
Immature Granulocytes: 1 %
Lymphocytes Relative: 27 %
Lymphs Abs: 0.5 10*3/uL — ABNORMAL LOW (ref 0.7–4.0)
MCH: 29.3 pg (ref 26.0–34.0)
MCHC: 32.5 g/dL (ref 30.0–36.0)
MCV: 90.1 fL (ref 80.0–100.0)
Monocytes Absolute: 0.2 10*3/uL (ref 0.1–1.0)
Monocytes Relative: 13 %
Neutro Abs: 1 10*3/uL — ABNORMAL LOW (ref 1.7–7.7)
Neutrophils Relative %: 56 %
Platelets: 226 10*3/uL (ref 150–400)
RBC: 3.45 MIL/uL — ABNORMAL LOW (ref 3.87–5.11)
RDW: 19.9 % — ABNORMAL HIGH (ref 11.5–15.5)
WBC: 1.8 10*3/uL — ABNORMAL LOW (ref 4.0–10.5)
nRBC: 0 % (ref 0.0–0.2)

## 2023-04-02 LAB — CMP (CANCER CENTER ONLY)
ALT: 21 U/L (ref 0–44)
AST: 31 U/L (ref 15–41)
Albumin: 3.5 g/dL (ref 3.5–5.0)
Alkaline Phosphatase: 163 U/L — ABNORMAL HIGH (ref 38–126)
Anion gap: 9 (ref 5–15)
BUN: 23 mg/dL (ref 8–23)
CO2: 25 mmol/L (ref 22–32)
Calcium: 9.5 mg/dL (ref 8.9–10.3)
Chloride: 102 mmol/L (ref 98–111)
Creatinine: 1.46 mg/dL — ABNORMAL HIGH (ref 0.44–1.00)
GFR, Estimated: 38 mL/min — ABNORMAL LOW (ref >60)
Glucose, Bld: 103 mg/dL — ABNORMAL HIGH (ref 70–99)
Potassium: 4 mmol/L (ref 3.5–5.1)
Sodium: 136 mmol/L (ref 135–145)
Total Bilirubin: 0.8 mg/dL (ref 0.0–1.2)
Total Protein: 7.3 g/dL (ref 6.5–8.1)

## 2023-04-02 MED ORDER — AMOXICILLIN-POT CLAVULANATE 875-125 MG PO TABS: 1.0000 | ORAL_TABLET | Freq: Two times a day (BID) | ORAL | 0 refills | Status: DC

## 2023-04-02 MED ORDER — HYDROCODONE-ACETAMINOPHEN 5-325 MG PO TABS: 1.0000 | ORAL_TABLET | Freq: Four times a day (QID) | ORAL | 0 refills | Status: DC | PRN

## 2023-04-02 NOTE — Progress Notes (Signed)
 Clinical Pharmacist Practitioner [facility]  [phone] [phone]  [name], [name], [name], [name] Jim Like, RN as Registered Nurse Orlie Dakin Tollie Pizza, MD as Consulting Physician (Oncology) Carmina Miller, MD as Referring Physician (Radiation Oncology) Chriss Driver, RN as Registered Nurse   Name of the patient: Alicia [name]  [id number]  November 18, 1950   Date of visit: 04/02/23  HPI: Patient is a 73 y.o. female with metastatic ER+ HER2- breast cancer. Previously treated with palbociclib, but treatment was changed to Afinitor (everolimus) and exemestane due to disease progression 03/2022. Everolimus 10 mg held on 05/22/22 due to potential drug rash, patient started on steroid taper. Rash cleared and patient resumed everolimus 10 mg on 05/31/22. Patient called to report rash return on 06/04/22. Treatment was again held and prescription for hydrocortisone 2.5% cream sent in. Patient resumed everolimus 7.5mg  on 06/18/22 along with loratidine. Patient was admitted to [facility] hospital for altered mental status, dehydration, and weakness on 07/18/22, everolimus and exemestane were held with admission and at discharge. Patient resumed everolimus (at 5mg ) and exemestane on 09/20/22. Everolimus was again held on 10/05/22 due to weakness and fatigue, everolimus 5mg  was resumed on 10/08/22. Everolimus held again on 11/10/22. Patient was in the hospital 11/26/22-12/03/22 for pneumonia. Patient started ribociclib on 03/05/23.   Reason for Consult: Oral chemotherapy follow-up for ribociclib therapy.   PAST MEDICAL HISTORY: Past Medical History:  Diagnosis Date   Acute hypoxic respiratory failure (HCC) 11/26/2022   AKI (acute kidney injury) (HCC) 07/18/2022   Anxiety    Breast cancer, left (HCC) 07/2017   Depression    Electrolyte abnormality 07/18/2022    Elevated troponin 02/17/2015   Hypercalcemia 11/26/2022   Hypertension    Hypothyroidism    Personal history of chemotherapy 2019   LEFT mastectomy-chemo before   Personal history of radiation therapy 03/2018   LEFT mastectomy   Rapid heart rate    Thyroid disease     HEMATOLOGY/ONCOLOGY HISTORY:  Oncology History Overview Note  Patient is a 73 year old female who recently self palpated a mass on her left breast.  Subsequent imaging and biopsy revealed the above-stated breast cancer.  Case was also discussed extensively at case conference.  Given the size and the stage of patient's malignancy, she will benefit from neoadjuvant chemotherapy using Adriamycin, Cytoxan, and Taxol.  Patient will also require Neulasta support.  Will get CT scan of the chest, abdomen, and pelvis to assess for any metastatic disease.  Patient will also require port placement and MUGA prior to initiating treatment  CT abdomen/pelvis/chest did not reveal any suspicious lesions concerning for metastatic disease. (08/01/17)  Port-A-Cath placed on 08/07/2017.  Cycle 1 day 1 of AC was given on 08/08/17.     Breast cancer, stage 2, left (HCC)  07/24/2017 Initial Diagnosis   Breast cancer, stage 2, left (HCC)   07/30/2017 Cancer Staging   Staging form: Breast, AJCC 8th Edition - Clinical stage from 07/30/2017: Stage IIA (cT2, cN1, cM0, G2, ER+, PR+, HER2-) - Signed by Jeralyn Ruths, MD on 07/30/2017   08/08/2017 - 10/31/2017 Chemotherapy   The patient had DOXOrubicin (ADRIAMYCIN) chemo injection 130 mg, 60 mg/m2 = 130 mg, Intravenous,  Once, 4 of 4 cycles Administration: 130 mg (08/08/2017), 130 mg (08/22/2017), 130 mg (09/05/2017), 130 mg (09/19/2017) palonosetron (ALOXI) injection 0.25 mg, 0.25 mg, Intravenous,  Once, 4 of 4 cycles Administration: 0.25 mg (08/08/2017), 0.25  mg (08/22/2017), 0.25 mg (09/05/2017), 0.25 mg (09/19/2017) pegfilgrastim-cbqv (UDENYCA) injection 6 mg, 6 mg, Subcutaneous, Once, 4 of 4  cycles Administration: 6 mg (08/09/2017), 6 mg (08/23/2017), 6 mg (09/06/2017), 6 mg (09/20/2017) cyclophosphamide (CYTOXAN) 1,300 mg in sodium chloride 0.9 % 250 mL chemo infusion, 600 mg/m2 = 1,300 mg, Intravenous,  Once, 4 of 4 cycles Administration: 1,300 mg (08/08/2017), 1,300 mg (08/22/2017), 1,300 mg (09/05/2017), 1,300 mg (09/19/2017) PACLitaxel (TAXOL) 174 mg in sodium chloride 0.9 % 250 mL chemo infusion (</= 80mg /m2), 80 mg/m2 = 174 mg, Intravenous,  Once, 4 of 12 cycles Dose modification: 72 mg/m2 (original dose 80 mg/m2, Cycle 6, Reason: Dose not tolerated) Administration: 174 mg (10/03/2017), 156 mg (10/17/2017), 156 mg (10/24/2017), 156 mg (10/31/2017) fosaprepitant (EMEND) 150 mg, dexamethasone (DECADRON) 12 mg in sodium chloride 0.9 % 145 mL IVPB, , Intravenous,  Once, 4 of 4 cycles Administration:  (08/08/2017),  (08/22/2017),  (09/05/2017),  (09/19/2017)  for chemotherapy treatment.      ALLERGIES:  is allergic to sulfa antibiotics.  MEDICATIONS:  Current Outpatient Medications  Medication Sig Dispense Refill   acetaminophen (TYLENOL) 500 MG tablet Take 1,000 mg by mouth every 6 (six) hours as needed for moderate pain or headache.      ALPRAZolam (XANAX) 0.5 MG tablet Take 1 tablet (0.5 mg total) by mouth 2 (two) times daily as needed for anxiety. 60 tablet 1   ascorbic acid (VITAMIN C) 500 MG tablet Take 500 mg by mouth daily.     aspirin EC 81 MG tablet Take 81 mg by mouth at bedtime.      Cholecalciferol (VITAMIN D3) 125 MCG (5000 UT) CAPS Take 5,000 Units by mouth 2 (two) times a day.     exemestane (AROMASIN) 25 MG tablet Take 1 tablet (25 mg total) by mouth daily after breakfast. 30 tablet 3   feeding supplement (ENSURE ENLIVE / ENSURE PLUS) LIQD Take 237 mLs by mouth 2 (two) times daily between meals.     levothyroxine (SYNTHROID) 100 MCG tablet Take 1 tablet (100 mcg total) by mouth daily before breakfast.     linaclotide (LINZESS) 145 MCG CAPS capsule Take 145 mcg by mouth daily  before breakfast.     loratadine (CLARITIN) 10 MG tablet Take 10 mg by mouth daily. Take to reduce risk of rash from everolimus.     magnesium oxide (MAG-OX) 400 (240 Mg) MG tablet Take 400 mg by mouth daily.     midodrine (PROAMATINE) 10 MG tablet Take 1 tablet (10 mg total) by mouth 3 (three) times daily with meals. (Patient taking differently: Take 10 mg by mouth as needed (low bp).) 90 tablet 1   Multiple Vitamin (MULTIVITAMIN WITH MINERALS) TABS tablet Take 1 tablet by mouth daily.     ondansetron (ZOFRAN) 8 MG tablet Take 1 tablet (8 mg total) by mouth every 8 (eight) hours as needed. 30 tablet 1   pantoprazole (PROTONIX) 20 MG tablet TAKE 1 TABLET(20 MG) BY MOUTH DAILY 90 tablet 0   potassium chloride SA (KLOR-CON M) 20 MEQ tablet Take 2 tablets (40 mEq total) by mouth 2 (two) times daily. 180 tablet 3   potassium chloride SA (KLOR-CON M) 20 MEQ tablet Take 1 tablet (20 mEq total) by mouth 2 (two) times daily. 60 tablet 3   Probiotic Product (ALIGN) 4 MG CAPS Take 1 capsule by mouth daily.     prochlorperazine (COMPAZINE) 10 MG tablet TAKE 1 TABLET(10 MG) BY MOUTH EVERY 6 HOURS AS NEEDED FOR NAUSEA OR  VOMITING 30 tablet 2   ribociclib succ (KISQALI 400MG  DAILY DOSE) 200 MG Therapy Pack Take 2 tablets (400 mg total) by mouth daily. Take for 21 days on, 7 days off, repeat every 28 days. 42 tablet 0   rosuvastatin (CRESTOR) 5 MG tablet Take 1 tablet (5 mg total) by mouth daily. 90 tablet 3   traMADol (ULTRAM) 50 MG tablet Take 1 tablet (50 mg total) by mouth every 6 (six) hours as needed. 60 tablet 1   traZODone (DESYREL) 50 MG tablet Take 1 tablet (50 mg total) by mouth at bedtime. 30 tablet 2   No current facility-administered medications for this visit.    VITAL SIGNS: There were no vitals taken for this visit. There were no vitals filed for this visit.  Estimated body mass index is 26.95 kg/m as calculated from the following:   Height as of 03/19/23: 5\' 6"  (1.676 m).   Weight as of  03/19/23: 75.8 kg (167 lb).  LABS: CBC:    Component Value Date/Time   WBC 3.1 (L) 03/19/2023 0956   HGB 10.0 (L) 03/19/2023 0956   HGB 10.5 (L) 10/08/2022 0902   HGB 14.2 02/11/2011 1349   HCT 30.4 (L) 03/19/2023 0956   HCT 40.9 02/11/2011 1349   PLT 241 03/19/2023 0956   PLT 249 10/08/2022 0902   PLT 151 02/11/2011 1349   MCV 87.4 03/19/2023 0956   MCV 89 02/11/2011 1349   NEUTROABS 2.2 03/19/2023 0956   LYMPHSABS 0.7 03/19/2023 0956   MONOABS 0.2 03/19/2023 0956   EOSABS 0.1 03/19/2023 0956   BASOSABS 0.0 03/19/2023 0956   Comprehensive Metabolic Panel:    Component Value Date/Time   NA 138 03/19/2023 0957   NA 142 02/11/2011 1349   K 3.9 03/19/2023 0957   K 3.8 02/11/2011 1349   CL 103 03/19/2023 0957   CL 107 02/11/2011 1349   CO2 23 03/19/2023 0957   CO2 24 02/11/2011 1349   BUN 25 (H) 03/19/2023 0957   BUN 14 02/11/2011 1349   CREATININE 1.47 (H) 03/19/2023 0957   CREATININE 0.65 02/11/2011 1349   GLUCOSE 101 (H) 03/19/2023 0957   GLUCOSE 98 02/11/2011 1349   CALCIUM 9.6 03/19/2023 0957   CALCIUM 9.0 02/11/2011 1349   AST 33 03/19/2023 0957   ALT 25 03/19/2023 0957   ALT 36 02/11/2011 1349   ALKPHOS 169 (H) 03/19/2023 0957   ALKPHOS 122 02/11/2011 1349   BILITOT 1.0 03/19/2023 0957   PROT 7.1 03/19/2023 0957   PROT 7.0 02/11/2011 1349   ALBUMIN 3.5 03/19/2023 0957   ALBUMIN 4.1 02/11/2011 1349     Present during today's visit: patient and her daughter-in-law  Assessment and Plan: CMP/CBC reviewed, continue ribociclib 400mg  21on/7off Overall patient is feeling well since starting the ribociclib and seems to be tolerating treatment well She does have a bit of a cold today, she reports this started about 2 days. She has congestion and a cough. She is currently taking guaifenesin. I suggested she switch from loratadine to cetirizine. MD sending in rx for abx courser due to current neutropenia and cold ECG showed QTc wnl, this is the last scheduled ECG the  patient will need    Oral Chemotherapy Side Effect/Intolerance:  Fatigue: Patient reported brief fatigue requiring a nap a few hours after taking her ribociclib. With this upcoming cycle she will switch to taking her ribociclib in the evening No reported rash, diarrhea, or nausea  Oral Chemotherapy Adherence: No mised doses reported Ms.  Sallis patient barriers to medication adherence identified.   New medications: None reported  Medication Access Issues: PAP approved until 01/15/24  Patient expressed understanding and was in agreement with this plan. She also understands that She can call clinic at any time with any questions, concerns, or complaints.   Follow-up plan: RTC in 4 weeks  Thank you for allowing me to participate in the care of this very pleasant patient.   Time Total: 15 mins  Visit consisted of counseling and education on dealing with issues of symptom management in the setting of serious and potentially life-threatening illness.Greater than 50%  of this time was spent counseling and coordinating care related to the above assessment and plan.  Signed by: Remi Haggard, PharmD, Nolon Bussing, CPP Hematology/Oncology Clinical Pharmacist Practitioner [facility] [phone]  04/02/2023 10:14 AM

## 2023-04-02 NOTE — Progress Notes (Signed)
 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  INTERVAL HISTORY: Patient returns to clinic today for further evaluation and continuation of ribociclib.  She is tolerating her treatments well without significant side effects.  She has chronic weakness and fatigue.  She continues to have back pain.  She also has noticed increased cough and congestion without fever. She has a mild peripheral neuropathy, but no other neurologic complaints.  She denies any chest pain, shortness of breath,or hemoptysis.  She denies any nausea, vomiting, constipation, or diarrhea.  She has no urinary complaints.  Patient offers no further specific complaints today.  REVIEW OF SYSTEMS:   Review of Systems  Constitutional:  Positive for malaise/fatigue. Negative for fever and weight loss.  HENT:  Positive for congestion.   Respiratory:  Positive for cough. Negative for shortness of breath.   Cardiovascular: Negative.  Negative for chest pain and leg swelling.  Gastrointestinal: Negative.  Negative for abdominal pain, constipation, diarrhea and nausea.  Genitourinary:  Negative for dysuria, flank pain and urgency.  Musculoskeletal:  Positive for back pain. Negative for joint pain.  Skin: Negative.  Negative for rash.  Neurological:  Positive for tingling, sensory change and weakness. Negative for dizziness, focal weakness and headaches.  Psychiatric/Behavioral:  The patient is  not nervous/anxious.     As per HPI. Otherwise, a complete review of systems is negative.  PAST MEDICAL HISTORY: Past Medical History:  Diagnosis Date   Acute hypoxic respiratory failure (HCC) 11/26/2022   AKI (acute kidney injury) (HCC) 07/18/2022   Anxiety    Breast cancer, left (HCC) 07/2017   Depression    Electrolyte abnormality 07/18/2022   Elevated troponin 02/17/2015   Hypercalcemia 11/26/2022   Hypertension    Hypothyroidism    Personal history of chemotherapy 2019   LEFT mastectomy-chemo before   Personal history of radiation therapy 03/2018   LEFT mastectomy   Rapid heart rate    Thyroid disease     PAST SURGICAL HISTORY: Past Surgical History:  Procedure Laterality Date   AXILLARY LYMPH NODE BIOPSY Left 07/16/2017   METASTATIC MAMMARY CARCINOMA   BREAST BIOPSY Left 07/16/2017   [place] bx of left breast mass and left breast LN.  INVASIVE MAMMARY CARCINOMA, NO SPECIAL TYPE.    BREAST EXCISIONAL BIOPSY Right 2001   benign   BREAST LUMPECTOMY WITH SENTINEL LYMPH NODE BIOPSY Left 12/06/2017   Procedure: BREAST LUMPECTOMY WITH SENTINEL LYMPH NODE BX;  Surgeon: Earline Mayotte, MD;  Location: [facility] ORS;  Service: General;  Laterality: Left;   COLONOSCOPY     MASTECTOMY Left 12/23/2017   PORTACATH PLACEMENT Right 08/07/2017   Procedure: INSERTION PORT-A-CATH;  Surgeon: Earline Mayotte, MD;  Location: [facility] ORS;  Service: General;  Laterality: Right;   SIMPLE MASTECTOMY WITH AXILLARY SENTINEL NODE BIOPSY Left 12/23/2017   T2,N2 with 6/7 nodes positive. Whole breast radiation.  Surgeon: Earline Mayotte, MD;  Location: [facility] ORS;  Service: General;  Laterality: Left;   TONSILLECTOMY      FAMILY HISTORY: Family History  Problem Relation  Age of Onset   Stroke Mother    Thyroid disease Mother    Renal Disease Mother    Stroke Father    Heart attack Father    Sudden death Father 44       suicide   Anuerysm Brother    Breast cancer Neg Hx     ADVANCED  DIRECTIVES (Y/N):  N  HEALTH MAINTENANCE: Social History   Tobacco Use   Smoking status: Former    Current packs/day: 0.00    Average packs/day: 1 pack/day for 30.0 years (30.0 ttl pk-yrs)    Types: Cigarettes    Start date: 34    Quit date: 2019    Years since quitting: 6.2   Smokeless tobacco: Never  Vaping Use   Vaping status: Never Used  Substance Use Topics   Alcohol use: Not Currently   Drug use: Never     Colonoscopy:  PAP:  Bone density:  Lipid panel:  Allergies  Allergen Reactions   Sulfa Antibiotics Diarrhea    Current Outpatient Medications  Medication Sig Dispense Refill   acetaminophen (TYLENOL) 500 MG tablet Take 1,000 mg by mouth every 6 (six) hours as needed for moderate pain or headache.      ALPRAZolam (XANAX) 0.5 MG tablet Take 1 tablet (0.5 mg total) by mouth 2 (two) times daily as needed for anxiety. 60 tablet 1   amoxicillin-clavulanate (AUGMENTIN) 875-125 MG tablet Take 1 tablet by mouth 2 (two) times daily. 14 tablet 0   ascorbic acid (VITAMIN C) 500 MG tablet Take 500 mg by mouth daily.     aspirin EC 81 MG tablet Take 81 mg by mouth at bedtime.      Cholecalciferol (VITAMIN D3) 125 MCG (5000 UT) CAPS Take 5,000 Units by mouth 2 (two) times a day.     exemestane (AROMASIN) 25 MG tablet Take 1 tablet (25 mg total) by mouth daily after breakfast. 30 tablet 3   feeding supplement (ENSURE ENLIVE / ENSURE PLUS) LIQD Take 237 mLs by mouth 2 (two) times daily between meals.     levothyroxine (SYNTHROID) 100 MCG tablet Take 1 tablet (100 mcg total) by mouth daily before breakfast.     linaclotide (LINZESS) 145 MCG CAPS capsule Take 145 mcg by mouth daily before breakfast.     loratadine (CLARITIN) 10 MG tablet Take 10 mg by mouth daily. Take to reduce risk of rash from everolimus.     magnesium oxide (MAG-OX) 400 (240 Mg) MG tablet Take 400 mg by mouth daily.     midodrine (PROAMATINE) 10 MG tablet Take 1 tablet (10 mg total) by mouth 3 (three) times  daily with meals. (Patient taking differently: Take 10 mg by mouth as needed (low bp).) 90 tablet 1   Multiple Vitamin (MULTIVITAMIN WITH MINERALS) TABS tablet Take 1 tablet by mouth daily.     ondansetron (ZOFRAN) 8 MG tablet Take 1 tablet (8 mg total) by mouth every 8 (eight) hours as needed. 30 tablet 1   pantoprazole (PROTONIX) 20 MG tablet TAKE 1 TABLET(20 MG) BY MOUTH DAILY 90 tablet 0   potassium chloride SA (KLOR-CON M) 20 MEQ tablet Take 2 tablets (40 mEq total) by mouth 2 (two) times daily. 180 tablet 3   potassium chloride SA (KLOR-CON M) 20 MEQ tablet Take 1 tablet (20 mEq total) by mouth 2 (two) times daily. 60 tablet 3   Probiotic Product (ALIGN) 4 MG CAPS Take 1 capsule by mouth daily.     prochlorperazine (COMPAZINE)  10 MG tablet TAKE 1 TABLET(10 MG) BY MOUTH EVERY 6 HOURS AS NEEDED FOR NAUSEA OR VOMITING 30 tablet 2   ribociclib succ (KISQALI 400MG  DAILY DOSE) 200 MG Therapy Pack Take 2 tablets (400 mg total) by mouth daily. Take for 21 days on, 7 days off, repeat every 28 days. 42 tablet 0   rosuvastatin (CRESTOR) 5 MG tablet Take 1 tablet (5 mg total) by mouth daily. 90 tablet 3   traMADol (ULTRAM) 50 MG tablet Take 1 tablet (50 mg total) by mouth every 6 (six) hours as needed. 60 tablet 1   traZODone (DESYREL) 50 MG tablet Take 1 tablet (50 mg total) by mouth at bedtime. 30 tablet 2   No current facility-administered medications for this visit.    OBJECTIVE: Vitals:   04/02/23 1038  Pulse: 84  Resp: 16  Temp: (!) 96.9 F (36.1 C)  SpO2: 99%      Body mass index is 27.28 kg/m.    ECOG FS:1 - Symptomatic but completely ambulatory  General: Well-developed, well-nourished, no acute distress.  Sitting in wheelchair. Eyes: Pink conjunctiva, anicteric sclera. HEENT: Normocephalic, moist mucous membranes. Lungs: No audible wheezing or coughing. Heart: Regular rate and rhythm. Abdomen: Soft, nontender, no obvious distention. Musculoskeletal: No edema, cyanosis, or  clubbing. Neuro: Alert, answering all questions appropriately. Cranial nerves grossly intact. Skin: No rashes or petechiae noted. Psych: Normal affect.  LAB RESULTS:  Lab Results  Component Value Date   NA 136 04/02/2023   K 4.0 04/02/2023   CL 102 04/02/2023   CO2 25 04/02/2023   GLUCOSE 103 (H) 04/02/2023   BUN 23 04/02/2023   CREATININE 1.46 (H) 04/02/2023   CALCIUM 9.5 04/02/2023   PROT 7.3 04/02/2023   ALBUMIN 3.5 04/02/2023   AST 31 04/02/2023   ALT 21 04/02/2023   ALKPHOS 163 (H) 04/02/2023   BILITOT 0.8 04/02/2023   GFRNONAA 38 (L) 04/02/2023   GFRAA 57 (L) 10/01/2019    Lab Results  Component Value Date   WBC 1.8 (L) 04/02/2023   NEUTROABS 1.0 (L) 04/02/2023   HGB 10.1 (L) 04/02/2023   HCT 31.1 (L) 04/02/2023   MCV 90.1 04/02/2023   PLT 226 04/02/2023     STUDIES: No results found.    ONCOLOGY HISTORY: Patient initially received neoadjuvant chemotherapy with Adriamycin and Cytoxan followed by weekly Taxol. She only received 4 cycles of weekly Taxol prior to discontinuation of treatment on October 31, 2017 secondary to persistent peripheral neuropathy.  She ultimately required mastectomy and final pathology noted 5 of 6 lymph nodes positive for disease.  Patient completed adjuvant XRT in mid April 2020.  Pain initiated letrozole in May 2020, this was discontinued secondary to side effects and patient was started on anastrozole.  Nuclear medicine bone scan on June 19, 2019 revealed metastatic disease and anastrozole was subsequently discontinued.  PET scan results from October 02, 2021 revealed no clear evidence of metastatic progression.  PET scan results from June 30, 2019 reviewed independently with metastatic bony disease, but no obvious evidence of visceral disease.  MRI of the brain on August 24, 2019 reviewed independently with no obvious evidence of metastatic disease.   ASSESSMENT: Recurrent stage IV ER/PR positive, HER-2 negative invasive carcinoma with  bony metastasis.  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  Patient's CA 27-29 continues to slowly increase and now is 598.4.  Today's result is pending.  Recently, patient did  not receive any treatment other than Aromasin in nearly 3 months and treatment over the last 6-8 months has been intermittent at best.  [name] has been permanently discontinued given her issues with her mandible.  After lengthy discussion with the patient, patient did not wish to restart Afinitor given multiple hospital admissions, but based on the MAINTAIN trial she agreed to rechallenge with ribociclib after previously receiving a CDK 4/6 inhibitor.  Despite mild neutropenia, will continue with treatment for 21 days with 7 days off.  Return to clinic in 4 weeks for laboratory work, further evaluation, and continuation of treatment.  Appreciate clinical pharmacy input.   Constipation: Patient does not complain of this today.  Patient has been instructed she needs a regular daily bowel regimen. Peripheral neuropathy: Chronic and unchanged. History of pulmonary embolus: Patient was diagnosed with a small pulmonary embolus on January 10, 2018.  She is no longer on anticoagulation.  There was no evidence of recurrent PE on CT scan from October 27, 2019. Osteopenia: Patient's most recent bone mineral density on March 03, 2019 reported a T score of -1.8 which is only mildly decreased from 1 year prior when the reported T score was -1.6.  Continue calcium and vitamin D supplementation.  Patient no longer will receive bisphosphonates.   Left hip/flank pain: MRI of the lumbar and sacral spine revealed multiple compression and insufficiency fractures along with metastatic disease.  Patient will initiate XRT tomorrow.  Continue tramadol as needed.  She was also given a prescription for Norco today. Anemia: Chronic and unchanged.  Patient's hemoglobin is  10.1. Neutropenia: Secondary to treatment.  Patient's ANC is 1.0.  Proceed cautiously with treatment as above.   Renal insufficiency: Chronic and unchanged.  Patient's creatinine is 1.46.  Monitor. Hypokalemia: Resolved.  Continue oral potassium supplementation. Dental pathology: Significantly improved.  Continue follow up with Dr. Willaim Bane.  Patient has discontinued hyperbaric treatments at [facility].  No further Xgeva as above. Lymphedema: Chronic and unchanged.  Follow-up with occupational therapy as scheduled. Coping/anxiety: Continue trazodone at night.  Patient was also given a referral to [facility].  Cough/congestion: Patient was given a prescription for Augmentin today. t   Patient expressed understanding and was in agreement with this plan. She also understands that She can call clinic at any time with any questions, concerns, or complaints.    Cancer Staging  Breast cancer, stage 2, left (HCC) Staging form: Breast, AJCC 8th Edition - Clinical stage from 07/30/2017: Stage IIA (cT2, cN1, cM0, G2, ER+, PR+, HER2-) - Signed by Jeralyn Ruths, MD on 07/30/2017 Histologic grading system: 3 grade system Laterality: Left   Jeralyn Ruths, MD   04/02/2023 12:01 PM

## 2023-04-02 NOTE — Progress Notes (Signed)
 Having cold symptoms. Has had a productive cough and congestion for the last couple days. Is wondering if an antibiotic can be sent to her pharmacy.

## 2023-04-03 DIAGNOSIS — Z170 Estrogen receptor positive status [ER+]: Secondary | ICD-10-CM | POA: Diagnosis not present

## 2023-04-03 DIAGNOSIS — C7951 Secondary malignant neoplasm of bone: Secondary | ICD-10-CM | POA: Diagnosis not present

## 2023-04-03 DIAGNOSIS — Z510 Encounter for antineoplastic radiation therapy: Secondary | ICD-10-CM | POA: Diagnosis not present

## 2023-04-03 DIAGNOSIS — C50412 Malignant neoplasm of upper-outer quadrant of left female breast: Secondary | ICD-10-CM | POA: Diagnosis not present

## 2023-04-03 DIAGNOSIS — C50911 Malignant neoplasm of unspecified site of right female breast: Secondary | ICD-10-CM | POA: Diagnosis not present

## 2023-04-03 LAB — CANCER ANTIGEN 27.29: CA 27.29: 786.4 U/mL — ABNORMAL HIGH (ref 0.0–38.6)

## 2023-04-04 ENCOUNTER — Ambulatory Visit

## 2023-04-05 ENCOUNTER — Encounter: Payer: Self-pay | Admitting: Oncology

## 2023-04-08 ENCOUNTER — Ambulatory Visit
Admission: RE | Admit: 2023-04-08 | Discharge: 2023-04-08 | Disposition: A | Discharge status: Home / Self Care | Source: Ambulatory Visit | Attending: Radiation Oncology | Admitting: Radiation Oncology

## 2023-04-08 ENCOUNTER — Ambulatory Visit

## 2023-04-08 DIAGNOSIS — C50412 Malignant neoplasm of upper-outer quadrant of left female breast: Secondary | ICD-10-CM | POA: Diagnosis not present

## 2023-04-08 DIAGNOSIS — C7951 Secondary malignant neoplasm of bone: Secondary | ICD-10-CM | POA: Diagnosis not present

## 2023-04-08 DIAGNOSIS — C50911 Malignant neoplasm of unspecified site of right female breast: Secondary | ICD-10-CM | POA: Diagnosis not present

## 2023-04-08 DIAGNOSIS — Z510 Encounter for antineoplastic radiation therapy: Secondary | ICD-10-CM | POA: Diagnosis not present

## 2023-04-08 DIAGNOSIS — Z170 Estrogen receptor positive status [ER+]: Secondary | ICD-10-CM | POA: Diagnosis not present

## 2023-04-09 ENCOUNTER — Other Ambulatory Visit: Payer: Self-pay

## 2023-04-09 ENCOUNTER — Ambulatory Visit
Admission: RE | Admit: 2023-04-09 | Discharge: 2023-04-09 | Disposition: A | Discharge status: Home / Self Care | Source: Ambulatory Visit | Attending: Radiation Oncology | Admitting: Radiation Oncology

## 2023-04-09 DIAGNOSIS — C50412 Malignant neoplasm of upper-outer quadrant of left female breast: Secondary | ICD-10-CM | POA: Diagnosis not present

## 2023-04-09 DIAGNOSIS — Z510 Encounter for antineoplastic radiation therapy: Secondary | ICD-10-CM | POA: Diagnosis not present

## 2023-04-09 DIAGNOSIS — Z170 Estrogen receptor positive status [ER+]: Secondary | ICD-10-CM | POA: Diagnosis not present

## 2023-04-09 DIAGNOSIS — C7951 Secondary malignant neoplasm of bone: Secondary | ICD-10-CM | POA: Diagnosis not present

## 2023-04-09 DIAGNOSIS — C50911 Malignant neoplasm of unspecified site of right female breast: Secondary | ICD-10-CM | POA: Diagnosis not present

## 2023-04-09 LAB — RAD ONC ARIA SESSION SUMMARY
Course Elapsed Days: 0
Plan Fractions Treated to Date: 1
Plan Prescribed Dose Per Fraction: 3 Gy
Plan Total Fractions Prescribed: 10
Plan Total Prescribed Dose: 30 Gy
Reference Point Dosage Given to Date: 3 Gy
Reference Point Session Dosage Given: 3 Gy
Session Number: 1

## 2023-04-10 ENCOUNTER — Other Ambulatory Visit: Payer: Self-pay

## 2023-04-10 ENCOUNTER — Ambulatory Visit
Admission: RE | Admit: 2023-04-10 | Discharge: 2023-04-10 | Disposition: A | Discharge status: Home / Self Care | Source: Ambulatory Visit | Attending: Radiation Oncology | Admitting: Radiation Oncology

## 2023-04-10 ENCOUNTER — Other Ambulatory Visit: Payer: Self-pay | Admitting: Oncology

## 2023-04-10 DIAGNOSIS — C7951 Secondary malignant neoplasm of bone: Secondary | ICD-10-CM | POA: Diagnosis not present

## 2023-04-10 DIAGNOSIS — C50911 Malignant neoplasm of unspecified site of right female breast: Secondary | ICD-10-CM | POA: Diagnosis not present

## 2023-04-10 DIAGNOSIS — C50412 Malignant neoplasm of upper-outer quadrant of left female breast: Secondary | ICD-10-CM | POA: Diagnosis not present

## 2023-04-10 DIAGNOSIS — Z170 Estrogen receptor positive status [ER+]: Secondary | ICD-10-CM | POA: Diagnosis not present

## 2023-04-10 DIAGNOSIS — Z510 Encounter for antineoplastic radiation therapy: Secondary | ICD-10-CM | POA: Diagnosis not present

## 2023-04-10 LAB — RAD ONC ARIA SESSION SUMMARY
Course Elapsed Days: 1
Plan Fractions Treated to Date: 2
Plan Prescribed Dose Per Fraction: 3 Gy
Plan Total Fractions Prescribed: 10
Plan Total Prescribed Dose: 30 Gy
Reference Point Dosage Given to Date: 6 Gy
Reference Point Session Dosage Given: 3 Gy
Session Number: 2

## 2023-04-11 ENCOUNTER — Ambulatory Visit
Admission: RE | Admit: 2023-04-11 | Discharge: 2023-04-11 | Disposition: A | Discharge status: Home / Self Care | Source: Ambulatory Visit | Attending: Radiation Oncology | Admitting: Radiation Oncology

## 2023-04-11 ENCOUNTER — Other Ambulatory Visit: Payer: Self-pay

## 2023-04-11 DIAGNOSIS — Z170 Estrogen receptor positive status [ER+]: Secondary | ICD-10-CM | POA: Diagnosis not present

## 2023-04-11 DIAGNOSIS — Z510 Encounter for antineoplastic radiation therapy: Secondary | ICD-10-CM | POA: Diagnosis not present

## 2023-04-11 DIAGNOSIS — C7951 Secondary malignant neoplasm of bone: Secondary | ICD-10-CM | POA: Diagnosis not present

## 2023-04-11 DIAGNOSIS — C50412 Malignant neoplasm of upper-outer quadrant of left female breast: Secondary | ICD-10-CM | POA: Diagnosis not present

## 2023-04-11 DIAGNOSIS — C50911 Malignant neoplasm of unspecified site of right female breast: Secondary | ICD-10-CM | POA: Diagnosis not present

## 2023-04-11 LAB — RAD ONC ARIA SESSION SUMMARY
Course Elapsed Days: 2
Plan Fractions Treated to Date: 3
Plan Prescribed Dose Per Fraction: 3 Gy
Plan Total Fractions Prescribed: 10
Plan Total Prescribed Dose: 30 Gy
Reference Point Dosage Given to Date: 9 Gy
Reference Point Session Dosage Given: 3 Gy
Session Number: 3

## 2023-04-12 ENCOUNTER — Ambulatory Visit
Admission: RE | Admit: 2023-04-12 | Discharge: 2023-04-12 | Disposition: A | Discharge status: Home / Self Care | Source: Ambulatory Visit | Attending: Radiation Oncology | Admitting: Radiation Oncology

## 2023-04-12 ENCOUNTER — Other Ambulatory Visit: Payer: Self-pay

## 2023-04-12 DIAGNOSIS — Z510 Encounter for antineoplastic radiation therapy: Secondary | ICD-10-CM | POA: Diagnosis not present

## 2023-04-12 DIAGNOSIS — C50412 Malignant neoplasm of upper-outer quadrant of left female breast: Secondary | ICD-10-CM | POA: Diagnosis not present

## 2023-04-12 DIAGNOSIS — C7951 Secondary malignant neoplasm of bone: Secondary | ICD-10-CM | POA: Diagnosis not present

## 2023-04-12 DIAGNOSIS — Z170 Estrogen receptor positive status [ER+]: Secondary | ICD-10-CM | POA: Diagnosis not present

## 2023-04-12 DIAGNOSIS — C50911 Malignant neoplasm of unspecified site of right female breast: Secondary | ICD-10-CM | POA: Diagnosis not present

## 2023-04-12 LAB — RAD ONC ARIA SESSION SUMMARY
Course Elapsed Days: 3
Plan Fractions Treated to Date: 4
Plan Prescribed Dose Per Fraction: 3 Gy
Plan Total Fractions Prescribed: 10
Plan Total Prescribed Dose: 30 Gy
Reference Point Dosage Given to Date: 12 Gy
Reference Point Session Dosage Given: 3 Gy
Session Number: 4

## 2023-04-15 ENCOUNTER — Ambulatory Visit
Admission: RE | Admit: 2023-04-15 | Discharge: 2023-04-15 | Disposition: A | Discharge status: Home / Self Care | Source: Ambulatory Visit | Attending: Radiation Oncology | Admitting: Radiation Oncology

## 2023-04-15 ENCOUNTER — Other Ambulatory Visit: Payer: Self-pay

## 2023-04-15 DIAGNOSIS — C7951 Secondary malignant neoplasm of bone: Secondary | ICD-10-CM | POA: Diagnosis not present

## 2023-04-15 DIAGNOSIS — C50412 Malignant neoplasm of upper-outer quadrant of left female breast: Secondary | ICD-10-CM | POA: Diagnosis not present

## 2023-04-15 DIAGNOSIS — C50911 Malignant neoplasm of unspecified site of right female breast: Secondary | ICD-10-CM | POA: Diagnosis not present

## 2023-04-15 DIAGNOSIS — Z510 Encounter for antineoplastic radiation therapy: Secondary | ICD-10-CM | POA: Diagnosis not present

## 2023-04-15 DIAGNOSIS — Z170 Estrogen receptor positive status [ER+]: Secondary | ICD-10-CM | POA: Diagnosis not present

## 2023-04-15 LAB — RAD ONC ARIA SESSION SUMMARY
Course Elapsed Days: 6
Plan Fractions Treated to Date: 5
Plan Prescribed Dose Per Fraction: 3 Gy
Plan Total Fractions Prescribed: 10
Plan Total Prescribed Dose: 30 Gy
Reference Point Dosage Given to Date: 15 Gy
Reference Point Session Dosage Given: 3 Gy
Session Number: 5

## 2023-04-16 ENCOUNTER — Other Ambulatory Visit: Payer: Self-pay

## 2023-04-16 ENCOUNTER — Ambulatory Visit
Admission: RE | Admit: 2023-04-16 | Discharge: 2023-04-16 | Disposition: A | Discharge status: Home / Self Care | Source: Ambulatory Visit | Attending: Radiation Oncology | Admitting: Radiation Oncology

## 2023-04-16 DIAGNOSIS — Z510 Encounter for antineoplastic radiation therapy: Secondary | ICD-10-CM | POA: Insufficient documentation

## 2023-04-16 DIAGNOSIS — Z170 Estrogen receptor positive status [ER+]: Secondary | ICD-10-CM | POA: Diagnosis not present

## 2023-04-16 DIAGNOSIS — C7951 Secondary malignant neoplasm of bone: Secondary | ICD-10-CM | POA: Insufficient documentation

## 2023-04-16 DIAGNOSIS — C50911 Malignant neoplasm of unspecified site of right female breast: Secondary | ICD-10-CM | POA: Insufficient documentation

## 2023-04-16 DIAGNOSIS — C50412 Malignant neoplasm of upper-outer quadrant of left female breast: Secondary | ICD-10-CM | POA: Diagnosis not present

## 2023-04-16 LAB — RAD ONC ARIA SESSION SUMMARY
Course Elapsed Days: 7
Plan Fractions Treated to Date: 6
Plan Prescribed Dose Per Fraction: 3 Gy
Plan Total Fractions Prescribed: 10
Plan Total Prescribed Dose: 30 Gy
Reference Point Dosage Given to Date: 18 Gy
Reference Point Session Dosage Given: 3 Gy
Session Number: 6

## 2023-04-17 ENCOUNTER — Ambulatory Visit
Admission: RE | Admit: 2023-04-17 | Discharge: 2023-04-17 | Disposition: A | Discharge status: Home / Self Care | Source: Ambulatory Visit | Attending: Radiation Oncology | Admitting: Radiation Oncology

## 2023-04-17 ENCOUNTER — Other Ambulatory Visit: Payer: Self-pay

## 2023-04-17 DIAGNOSIS — C50412 Malignant neoplasm of upper-outer quadrant of left female breast: Secondary | ICD-10-CM | POA: Diagnosis not present

## 2023-04-17 DIAGNOSIS — Z170 Estrogen receptor positive status [ER+]: Secondary | ICD-10-CM | POA: Diagnosis not present

## 2023-04-17 DIAGNOSIS — C7951 Secondary malignant neoplasm of bone: Secondary | ICD-10-CM | POA: Diagnosis not present

## 2023-04-17 DIAGNOSIS — C50911 Malignant neoplasm of unspecified site of right female breast: Secondary | ICD-10-CM | POA: Diagnosis not present

## 2023-04-17 DIAGNOSIS — Z510 Encounter for antineoplastic radiation therapy: Secondary | ICD-10-CM | POA: Diagnosis not present

## 2023-04-17 LAB — RAD ONC ARIA SESSION SUMMARY
Course Elapsed Days: 8
Plan Fractions Treated to Date: 7
Plan Prescribed Dose Per Fraction: 3 Gy
Plan Total Fractions Prescribed: 10
Plan Total Prescribed Dose: 30 Gy
Reference Point Dosage Given to Date: 21 Gy
Reference Point Session Dosage Given: 3 Gy
Session Number: 7

## 2023-04-18 ENCOUNTER — Other Ambulatory Visit: Payer: Self-pay

## 2023-04-18 ENCOUNTER — Ambulatory Visit
Admission: RE | Admit: 2023-04-18 | Discharge: 2023-04-18 | Disposition: A | Discharge status: Home / Self Care | Source: Ambulatory Visit | Attending: Radiation Oncology | Admitting: Radiation Oncology

## 2023-04-18 DIAGNOSIS — C50412 Malignant neoplasm of upper-outer quadrant of left female breast: Secondary | ICD-10-CM | POA: Diagnosis not present

## 2023-04-18 DIAGNOSIS — Z510 Encounter for antineoplastic radiation therapy: Secondary | ICD-10-CM | POA: Diagnosis not present

## 2023-04-18 DIAGNOSIS — C50911 Malignant neoplasm of unspecified site of right female breast: Secondary | ICD-10-CM | POA: Diagnosis not present

## 2023-04-18 DIAGNOSIS — Z170 Estrogen receptor positive status [ER+]: Secondary | ICD-10-CM | POA: Diagnosis not present

## 2023-04-18 DIAGNOSIS — C7951 Secondary malignant neoplasm of bone: Secondary | ICD-10-CM | POA: Diagnosis not present

## 2023-04-18 LAB — RAD ONC ARIA SESSION SUMMARY
Course Elapsed Days: 9
Plan Fractions Treated to Date: 8
Plan Prescribed Dose Per Fraction: 3 Gy
Plan Total Fractions Prescribed: 10
Plan Total Prescribed Dose: 30 Gy
Reference Point Dosage Given to Date: 24 Gy
Reference Point Session Dosage Given: 3 Gy
Session Number: 8

## 2023-04-19 ENCOUNTER — Other Ambulatory Visit: Payer: Self-pay

## 2023-04-19 ENCOUNTER — Ambulatory Visit

## 2023-04-19 ENCOUNTER — Ambulatory Visit
Admission: RE | Admit: 2023-04-19 | Discharge: 2023-04-19 | Disposition: A | Discharge status: Home / Self Care | Source: Ambulatory Visit | Attending: Radiation Oncology | Admitting: Radiation Oncology

## 2023-04-19 DIAGNOSIS — C50911 Malignant neoplasm of unspecified site of right female breast: Secondary | ICD-10-CM | POA: Diagnosis not present

## 2023-04-19 DIAGNOSIS — Z170 Estrogen receptor positive status [ER+]: Secondary | ICD-10-CM | POA: Diagnosis not present

## 2023-04-19 DIAGNOSIS — Z510 Encounter for antineoplastic radiation therapy: Secondary | ICD-10-CM | POA: Diagnosis not present

## 2023-04-19 DIAGNOSIS — C7951 Secondary malignant neoplasm of bone: Secondary | ICD-10-CM | POA: Diagnosis not present

## 2023-04-19 DIAGNOSIS — C50412 Malignant neoplasm of upper-outer quadrant of left female breast: Secondary | ICD-10-CM | POA: Diagnosis not present

## 2023-04-19 LAB — RAD ONC ARIA SESSION SUMMARY
Course Elapsed Days: 10
Plan Fractions Treated to Date: 9
Plan Prescribed Dose Per Fraction: 3 Gy
Plan Total Fractions Prescribed: 10
Plan Total Prescribed Dose: 30 Gy
Reference Point Dosage Given to Date: 27 Gy
Reference Point Session Dosage Given: 3 Gy
Session Number: 9

## 2023-04-22 ENCOUNTER — Ambulatory Visit
Admission: RE | Admit: 2023-04-22 | Discharge: 2023-04-22 | Disposition: A | Discharge status: Home / Self Care | Source: Ambulatory Visit | Attending: Radiation Oncology | Admitting: Radiation Oncology

## 2023-04-22 ENCOUNTER — Other Ambulatory Visit: Payer: Self-pay

## 2023-04-22 DIAGNOSIS — C50412 Malignant neoplasm of upper-outer quadrant of left female breast: Secondary | ICD-10-CM | POA: Diagnosis not present

## 2023-04-22 DIAGNOSIS — C50911 Malignant neoplasm of unspecified site of right female breast: Secondary | ICD-10-CM | POA: Diagnosis not present

## 2023-04-22 DIAGNOSIS — Z510 Encounter for antineoplastic radiation therapy: Secondary | ICD-10-CM | POA: Diagnosis not present

## 2023-04-22 DIAGNOSIS — Z170 Estrogen receptor positive status [ER+]: Secondary | ICD-10-CM | POA: Diagnosis not present

## 2023-04-22 DIAGNOSIS — C7951 Secondary malignant neoplasm of bone: Secondary | ICD-10-CM | POA: Diagnosis not present

## 2023-04-22 LAB — RAD ONC ARIA SESSION SUMMARY
Course Elapsed Days: 13
Plan Fractions Treated to Date: 10
Plan Prescribed Dose Per Fraction: 3 Gy
Plan Total Fractions Prescribed: 10
Plan Total Prescribed Dose: 30 Gy
Reference Point Dosage Given to Date: 30 Gy
Reference Point Session Dosage Given: 3 Gy
Session Number: 10

## 2023-04-23 DIAGNOSIS — C50412 Malignant neoplasm of upper-outer quadrant of left female breast: Secondary | ICD-10-CM | POA: Diagnosis not present

## 2023-04-23 DIAGNOSIS — C7951 Secondary malignant neoplasm of bone: Secondary | ICD-10-CM | POA: Diagnosis not present

## 2023-04-23 DIAGNOSIS — Z510 Encounter for antineoplastic radiation therapy: Secondary | ICD-10-CM | POA: Diagnosis not present

## 2023-04-23 DIAGNOSIS — C50911 Malignant neoplasm of unspecified site of right female breast: Secondary | ICD-10-CM | POA: Diagnosis not present

## 2023-04-23 DIAGNOSIS — Z170 Estrogen receptor positive status [ER+]: Secondary | ICD-10-CM | POA: Diagnosis not present

## 2023-04-24 ENCOUNTER — Ambulatory Visit
Admission: RE | Admit: 2023-04-24 | Discharge: 2023-04-24 | Disposition: A | Discharge status: Home / Self Care | Source: Ambulatory Visit | Attending: Radiation Oncology | Admitting: Radiation Oncology

## 2023-04-24 ENCOUNTER — Other Ambulatory Visit: Payer: Self-pay

## 2023-04-24 DIAGNOSIS — Z170 Estrogen receptor positive status [ER+]: Secondary | ICD-10-CM | POA: Diagnosis not present

## 2023-04-24 DIAGNOSIS — C50412 Malignant neoplasm of upper-outer quadrant of left female breast: Secondary | ICD-10-CM | POA: Diagnosis not present

## 2023-04-24 DIAGNOSIS — Z510 Encounter for antineoplastic radiation therapy: Secondary | ICD-10-CM | POA: Diagnosis not present

## 2023-04-24 DIAGNOSIS — C50911 Malignant neoplasm of unspecified site of right female breast: Secondary | ICD-10-CM | POA: Diagnosis not present

## 2023-04-24 DIAGNOSIS — C7951 Secondary malignant neoplasm of bone: Secondary | ICD-10-CM | POA: Diagnosis not present

## 2023-04-24 LAB — RAD ONC ARIA SESSION SUMMARY
Course Elapsed Days: 15
Plan Fractions Treated to Date: 1
Plan Prescribed Dose Per Fraction: 3 Gy
Plan Total Fractions Prescribed: 10
Plan Total Prescribed Dose: 30 Gy
Reference Point Dosage Given to Date: 3 Gy
Reference Point Session Dosage Given: 3 Gy
Session Number: 11

## 2023-04-25 ENCOUNTER — Telehealth: Payer: Self-pay | Admitting: *Deleted

## 2023-04-25 ENCOUNTER — Ambulatory Visit
Admission: RE | Admit: 2023-04-25 | Discharge: 2023-04-25 | Disposition: A | Discharge status: Home / Self Care | Source: Ambulatory Visit | Attending: Radiation Oncology | Admitting: Radiation Oncology

## 2023-04-25 ENCOUNTER — Other Ambulatory Visit: Payer: Self-pay | Admitting: *Deleted

## 2023-04-25 ENCOUNTER — Other Ambulatory Visit: Payer: Self-pay | Admitting: Radiation Oncology

## 2023-04-25 ENCOUNTER — Other Ambulatory Visit: Payer: Self-pay

## 2023-04-25 DIAGNOSIS — C50919 Malignant neoplasm of unspecified site of unspecified female breast: Secondary | ICD-10-CM | POA: Diagnosis not present

## 2023-04-25 DIAGNOSIS — Z510 Encounter for antineoplastic radiation therapy: Secondary | ICD-10-CM | POA: Diagnosis not present

## 2023-04-25 DIAGNOSIS — C50911 Malignant neoplasm of unspecified site of right female breast: Secondary | ICD-10-CM | POA: Diagnosis not present

## 2023-04-25 DIAGNOSIS — C7951 Secondary malignant neoplasm of bone: Secondary | ICD-10-CM | POA: Insufficient documentation

## 2023-04-25 DIAGNOSIS — M898X2 Other specified disorders of bone, upper arm: Secondary | ICD-10-CM | POA: Diagnosis not present

## 2023-04-25 DIAGNOSIS — M84422A Pathological fracture, left humerus, initial encounter for fracture: Secondary | ICD-10-CM | POA: Diagnosis not present

## 2023-04-25 LAB — RAD ONC ARIA SESSION SUMMARY
Course Elapsed Days: 16
Plan Fractions Treated to Date: 2
Plan Prescribed Dose Per Fraction: 3 Gy
Plan Total Fractions Prescribed: 10
Plan Total Prescribed Dose: 30 Gy
Reference Point Dosage Given to Date: 6 Gy
Reference Point Session Dosage Given: 3 Gy
Session Number: 12

## 2023-04-25 NOTE — Telephone Encounter (Signed)
 I called the phone and was told that Alicia [name] says that the radiology staff says that MD should look at scan because it has :Pathologic fracture left mid humeral diaphyseal region as described .  I told Alicia [name] that the staff stop at 4 pm and I will tell them this info in am

## 2023-04-26 ENCOUNTER — Ambulatory Visit

## 2023-04-26 ENCOUNTER — Telehealth: Payer: Self-pay | Admitting: *Deleted

## 2023-04-26 NOTE — Telephone Encounter (Signed)
 Unable to speak with the patient due to no answer.  Spoke with daughter-in-law regarding results of the x-rays of the humerus and shoulder patient was sent to have on 04-25-23.  Encouraged patient to be seen at the [facility] in their Urgent care last evening.   Daughter in law verbalized understanding and agreed to have the patient seen.  MD aware.

## 2023-04-29 ENCOUNTER — Other Ambulatory Visit: Payer: Self-pay

## 2023-04-29 ENCOUNTER — Ambulatory Visit
Admission: RE | Admit: 2023-04-29 | Discharge: 2023-04-29 | Disposition: A | Discharge status: Home / Self Care | Source: Ambulatory Visit | Attending: Radiation Oncology | Admitting: Radiation Oncology

## 2023-04-29 DIAGNOSIS — Z510 Encounter for antineoplastic radiation therapy: Secondary | ICD-10-CM | POA: Diagnosis not present

## 2023-04-29 DIAGNOSIS — C50911 Malignant neoplasm of unspecified site of right female breast: Secondary | ICD-10-CM | POA: Diagnosis not present

## 2023-04-29 DIAGNOSIS — C7951 Secondary malignant neoplasm of bone: Secondary | ICD-10-CM | POA: Diagnosis not present

## 2023-04-29 LAB — RAD ONC ARIA SESSION SUMMARY
Course Elapsed Days: 20
Plan Fractions Treated to Date: 3
Plan Prescribed Dose Per Fraction: 3 Gy
Plan Total Fractions Prescribed: 10
Plan Total Prescribed Dose: 30 Gy
Reference Point Dosage Given to Date: 9 Gy
Reference Point Session Dosage Given: 3 Gy
Session Number: 13

## 2023-04-30 ENCOUNTER — Inpatient Hospital Stay: Admitting: Pharmacist

## 2023-04-30 ENCOUNTER — Inpatient Hospital Stay (HOSPITAL_BASED_OUTPATIENT_CLINIC_OR_DEPARTMENT_OTHER): Admitting: Oncology

## 2023-04-30 ENCOUNTER — Ambulatory Visit
Admission: RE | Admit: 2023-04-30 | Discharge: 2023-04-30 | Disposition: A | Discharge status: Home / Self Care | Source: Ambulatory Visit | Attending: Radiation Oncology | Admitting: Radiation Oncology

## 2023-04-30 ENCOUNTER — Other Ambulatory Visit: Payer: Self-pay

## 2023-04-30 ENCOUNTER — Inpatient Hospital Stay

## 2023-04-30 VITALS — BP 137/87 | HR 72 | Temp 98.7°F | Resp 12 | Wt 172.2 lb

## 2023-04-30 DIAGNOSIS — C50412 Malignant neoplasm of upper-outer quadrant of left female breast: Secondary | ICD-10-CM | POA: Diagnosis not present

## 2023-04-30 DIAGNOSIS — Z170 Estrogen receptor positive status [ER+]: Secondary | ICD-10-CM | POA: Insufficient documentation

## 2023-04-30 DIAGNOSIS — C50911 Malignant neoplasm of unspecified site of right female breast: Secondary | ICD-10-CM

## 2023-04-30 DIAGNOSIS — C7951 Secondary malignant neoplasm of bone: Secondary | ICD-10-CM | POA: Insufficient documentation

## 2023-04-30 DIAGNOSIS — K5900 Constipation, unspecified: Secondary | ICD-10-CM | POA: Insufficient documentation

## 2023-04-30 DIAGNOSIS — M8580 Other specified disorders of bone density and structure, unspecified site: Secondary | ICD-10-CM | POA: Insufficient documentation

## 2023-04-30 DIAGNOSIS — F419 Anxiety disorder, unspecified: Secondary | ICD-10-CM | POA: Insufficient documentation

## 2023-04-30 DIAGNOSIS — D709 Neutropenia, unspecified: Secondary | ICD-10-CM | POA: Insufficient documentation

## 2023-04-30 DIAGNOSIS — N289 Disorder of kidney and ureter, unspecified: Secondary | ICD-10-CM | POA: Insufficient documentation

## 2023-04-30 DIAGNOSIS — I890 Lymphedema, not elsewhere classified: Secondary | ICD-10-CM | POA: Insufficient documentation

## 2023-04-30 DIAGNOSIS — D649 Anemia, unspecified: Secondary | ICD-10-CM | POA: Insufficient documentation

## 2023-04-30 DIAGNOSIS — G629 Polyneuropathy, unspecified: Secondary | ICD-10-CM | POA: Insufficient documentation

## 2023-04-30 DIAGNOSIS — C50812 Malignant neoplasm of overlapping sites of left female breast: Secondary | ICD-10-CM | POA: Insufficient documentation

## 2023-04-30 DIAGNOSIS — M4850XA Collapsed vertebra, not elsewhere classified, site unspecified, initial encounter for fracture: Secondary | ICD-10-CM | POA: Insufficient documentation

## 2023-04-30 DIAGNOSIS — Z86711 Personal history of pulmonary embolism: Secondary | ICD-10-CM | POA: Insufficient documentation

## 2023-04-30 DIAGNOSIS — C50912 Malignant neoplasm of unspecified site of left female breast: Secondary | ICD-10-CM

## 2023-04-30 DIAGNOSIS — Z510 Encounter for antineoplastic radiation therapy: Secondary | ICD-10-CM | POA: Diagnosis not present

## 2023-04-30 LAB — CMP (CANCER CENTER ONLY)
ALT: 16 U/L (ref 0–44)
AST: 35 U/L (ref 15–41)
Albumin: 3.7 g/dL (ref 3.5–5.0)
Alkaline Phosphatase: 158 U/L — ABNORMAL HIGH (ref 38–126)
Anion gap: 9 (ref 5–15)
BUN: 20 mg/dL (ref 8–23)
CO2: 23 mmol/L (ref 22–32)
Calcium: 9.9 mg/dL (ref 8.9–10.3)
Chloride: 106 mmol/L (ref 98–111)
Creatinine: 1.28 mg/dL — ABNORMAL HIGH (ref 0.44–1.00)
GFR, Estimated: 44 mL/min — ABNORMAL LOW (ref >60)
Glucose, Bld: 89 mg/dL (ref 70–99)
Potassium: 4.5 mmol/L (ref 3.5–5.1)
Sodium: 138 mmol/L (ref 135–145)
Total Bilirubin: 0.6 mg/dL (ref 0.0–1.2)
Total Protein: 7 g/dL (ref 6.5–8.1)

## 2023-04-30 LAB — RAD ONC ARIA SESSION SUMMARY
Course Elapsed Days: 21
Plan Fractions Treated to Date: 4
Plan Prescribed Dose Per Fraction: 3 Gy
Plan Total Fractions Prescribed: 10
Plan Total Prescribed Dose: 30 Gy
Reference Point Dosage Given to Date: 12 Gy
Reference Point Session Dosage Given: 3 Gy
Session Number: 14

## 2023-04-30 LAB — CBC WITH DIFFERENTIAL/PLATELET
Abs Immature Granulocytes: 0.02 10*3/uL (ref 0.00–0.07)
Basophils Absolute: 0 10*3/uL (ref 0.0–0.1)
Basophils Relative: 1 %
Eosinophils Absolute: 0 10*3/uL (ref 0.0–0.5)
Eosinophils Relative: 2 %
HCT: 28.7 % — ABNORMAL LOW (ref 36.0–46.0)
Hemoglobin: 9.1 g/dL — ABNORMAL LOW (ref 12.0–15.0)
Immature Granulocytes: 1 %
Lymphocytes Relative: 17 %
Lymphs Abs: 0.3 10*3/uL — ABNORMAL LOW (ref 0.7–4.0)
MCH: 31.2 pg (ref 26.0–34.0)
MCHC: 31.7 g/dL (ref 30.0–36.0)
MCV: 98.3 fL (ref 80.0–100.0)
Monocytes Absolute: 0.4 10*3/uL (ref 0.1–1.0)
Monocytes Relative: 25 %
Neutro Abs: 0.9 10*3/uL — ABNORMAL LOW (ref 1.7–7.7)
Neutrophils Relative %: 54 %
Platelets: 203 10*3/uL (ref 150–400)
RBC: 2.92 MIL/uL — ABNORMAL LOW (ref 3.87–5.11)
RDW: 23.9 % — ABNORMAL HIGH (ref 11.5–15.5)
WBC: 1.7 10*3/uL — ABNORMAL LOW (ref 4.0–10.5)
nRBC: 0 % (ref 0.0–0.2)

## 2023-04-30 NOTE — Progress Notes (Signed)
 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  INTERVAL HISTORY: Patient returns to clinic today for further evaluation and continuation of ribociclib.  In the past several weeks she notices significant increase in left arm pain and was found to have a pathologic fracture of her left humerus.  She has seen orthopedics and now is initiating XRT to this area.  She otherwise feels well.  She is tolerating her treatments without significant side effects.  She continues to have chronic weakness and fatigue.  She has a mild peripheral neuropathy, but no other neurologic complaints.  She denies any chest pain, shortness of breath, cough, or hemoptysis.  She denies any nausea, vomiting, constipation, or diarrhea.  She has no urinary complaints.  Patient offers no further specific complaints today.  REVIEW OF SYSTEMS:   Review of Systems  Constitutional:  Positive for malaise/fatigue. Negative for fever and weight loss.  HENT:  Negative for congestion.   Respiratory:  Negative for cough and shortness of breath.   Cardiovascular: Negative.  Negative for chest pain and leg swelling.  Gastrointestinal: Negative.  Negative for abdominal pain, constipation, diarrhea and nausea.  Genitourinary: Negative.  Negative for dysuria, flank pain and urgency.  Musculoskeletal:  Positive for back pain. Negative for joint pain.  Skin: Negative.  Negative for rash.   Neurological:  Positive for tingling, sensory change and weakness. Negative for dizziness, focal weakness and headaches.  Psychiatric/Behavioral:  The patient is not nervous/anxious.     As per HPI. Otherwise, a complete review of systems is negative.  PAST MEDICAL HISTORY: Past Medical History:  Diagnosis Date   Acute hypoxic respiratory failure (HCC) 11/26/2022   AKI (acute kidney injury) (HCC) 07/18/2022   Anxiety    Breast cancer, left (HCC) 07/2017   Depression    Electrolyte abnormality 07/18/2022   Elevated troponin 02/17/2015   Hypercalcemia 11/26/2022   Hypertension    Hypothyroidism    Personal history of chemotherapy 2019   LEFT mastectomy-chemo before   Personal history of radiation therapy 03/2018   LEFT mastectomy   Rapid heart rate    Thyroid disease     PAST SURGICAL HISTORY: Past Surgical History:  Procedure Laterality Date   AXILLARY LYMPH NODE BIOPSY Left 07/16/2017   METASTATIC MAMMARY CARCINOMA   BREAST BIOPSY Left 07/16/2017   us  bx of left breast mass and left breast LN.  INVASIVE MAMMARY CARCINOMA, NO SPECIAL TYPE.    BREAST EXCISIONAL BIOPSY Right 2001   benign   BREAST LUMPECTOMY WITH SENTINEL LYMPH NODE BIOPSY Left 12/06/2017   Procedure: BREAST LUMPECTOMY WITH SENTINEL LYMPH NODE BX;  Surgeon: Marshall Skeeter, MD;  Location: [facility] ORS;  Service: General;  Laterality: Left;   COLONOSCOPY     MASTECTOMY Left 12/23/2017   PORTACATH PLACEMENT Right 08/07/2017   Procedure: INSERTION PORT-A-CATH;  Surgeon: Marshall Skeeter, MD;  Location: [facility] OR[facility];  Service: General;  Laterality: Right;   SIMPLE MASTECTOMY WITH AXILLARY SENTINEL NODE BIOPSY Left 12/23/2017   T2,N2 with 6/7 nodes positive. Whole breast radiation.  Surgeon: Earline Mayotte, MD;  Location: [facility] ORS;  Service: General;  Laterality: Left;   TONSILLECTOMY      FAMILY HISTORY: Family History  Problem Relation Age of Onset   Stroke Mother    Thyroid disease Mother    Renal  Disease Mother    Stroke Father    Heart attack Father    Sudden death Father 42       suicide   Anuerysm Brother    Breast cancer Neg Hx     ADVANCED DIRECTIVES (Y/N):  N  HEALTH MAINTENANCE: Social History   Tobacco Use   Smoking status: Former    Current packs/day: 0.00    Average packs/day: 1 pack/day for 30.0 years (30.0 ttl pk-yrs)    Types: Cigarettes    Start date: 17    Quit date: 2019    Years since quitting: 6.2   Smokeless tobacco: Never  Vaping Use   Vaping status: Never Used  Substance Use Topics   Alcohol use: Not Currently   Drug use: Never     Colonoscopy:  PAP:  Bone density:  Lipid panel:  Allergies  Allergen Reactions   Sulfa Antibiotics Diarrhea    Current Outpatient Medications  Medication Sig Dispense Refill   acetaminophen (TYLENOL) 500 MG tablet Take 1,000 mg by mouth every 6 (six) hours as needed for moderate pain or headache.      ALPRAZolam (XANAX) 0.5 MG tablet Take 1 tablet (0.5 mg total) by mouth 2 (two) times daily as needed for anxiety. 60 tablet 1   ascorbic acid (VITAMIN C) 500 MG tablet Take 500 mg by mouth daily.     aspirin EC 81 MG tablet Take 81 mg by mouth at bedtime.      Cholecalciferol (VITAMIN D3) 125 MCG (5000 UT) CAPS Take 5,000 Units by mouth 2 (two) times a day.     exemestane (AROMASIN) 25 MG tablet Take 1 tablet (25 mg total) by mouth daily after breakfast. 30 tablet 3   HYDROcodone-acetaminophen (NORCO/VICODIN) 5-325 MG tablet Take 1 tablet by mouth every 6 (six) hours as needed for moderate pain (pain score 4-6). 30 tablet 0   levothyroxine (SYNTHROID) 100 MCG tablet Take 1 tablet (100 mcg total) by mouth daily before breakfast.     linaclotide (LINZESS) 145 MCG CAPS capsule Take 145 mcg by mouth daily before breakfast.     loratadine (CLARITIN) 10 MG tablet Take 10 mg by mouth daily. Take to reduce risk of rash from everolimus.     magnesium oxide (MAG-OX) 400 (240 Mg) MG tablet Take 400 mg by mouth daily.      Multiple Vitamin (MULTIVITAMIN WITH MINERALS) TABS tablet Take 1 tablet by mouth daily.     ondansetron (ZOFRAN) 8 MG tablet Take 1 tablet (8 mg total) by mouth every 8 (eight) hours as needed. 30 tablet 1   pantoprazole (PROTONIX) 20 MG tablet TAKE 1 TABLET(20 MG) BY MOUTH DAILY 90 tablet 0   potassium chloride SA (KLOR-CON M) 20 MEQ tablet Take 2 tablets (40 mEq total) by mouth 2 (two) times daily. 180 tablet 3   Probiotic Product (ALIGN) 4 MG CAPS Take 1 capsule by mouth daily.     prochlorperazine (COMPAZINE) 10 MG tablet TAKE 1 TABLET(10 MG) BY MOUTH EVERY 6 HOURS AS NEEDED FOR NAUSEA OR VOMITING 30 tablet 2   ribociclib succ (KISQALI 400MG  DAILY DOSE) 200 MG Therapy Pack Take 2 tablets (400 mg total) by mouth daily. Take for 21 days  on, 7 days off, repeat every 28 days. 42 tablet 0   rosuvastatin (CRESTOR) 5 MG tablet Take 1 tablet (5 mg total) by mouth daily. 90 tablet 3   traMADol (ULTRAM) 50 MG tablet Take 1 tablet (50 mg total) by mouth every 6 (six) hours as needed. 60 tablet 1   traZODone (DESYREL) 50 MG tablet Take 1 tablet (50 mg total) by mouth at bedtime. 30 tablet 2   amoxicillin-clavulanate (AUGMENTIN) 875-125 MG tablet Take 1 tablet by mouth 2 (two) times daily. (Patient not taking: Reported on 04/30/2023) 14 tablet 0   feeding supplement (ENSURE ENLIVE / ENSURE PLUS) LIQD Take 237 mLs by mouth 2 (two) times daily between meals. (Patient not taking: Reported on 04/30/2023)     midodrine (PROAMATINE) 10 MG tablet Take 1 tablet (10 mg total) by mouth 3 (three) times daily with meals. (Patient taking differently: Take 10 mg by mouth as needed (low bp).) 90 tablet 1   potassium chloride SA (KLOR-CON M) 20 MEQ tablet Take 1 tablet (20 mEq total) by mouth 2 (two) times daily. (Patient not taking: Reported on 04/30/2023) 60 tablet 3   No current facility-administered medications for this visit.    OBJECTIVE: Vitals:   04/30/23 1004  BP: 137/87  Pulse: 72  Resp: 12  Temp: 98.7 F  (37.1 C)      Body mass index is 27.79 kg/m.    ECOG FS:2 - Symptomatic, <50% confined to bed  General: Well-developed, well-nourished, no acute distress.  Sitting in wheelchair. Eyes: Pink conjunctiva, anicteric sclera. HEENT: Normocephalic, moist mucous membranes. Lungs: No audible wheezing or coughing. Heart: Regular rate and rhythm. Abdomen: Soft, nontender, no obvious distention. Musculoskeletal: Left arm in 1/2 cast from orthopedics. Neuro: Alert, answering all questions appropriately. Cranial nerves grossly intact. Skin: No rashes or petechiae noted. Psych: Normal affect.   LAB RESULTS:  Lab Results  Component Value Date   NA 138 04/30/2023   K 4.5 04/30/2023   CL 106 04/30/2023   CO2 23 04/30/2023   GLUCOSE 89 04/30/2023   BUN 20 04/30/2023   CREATININE 1.28 (H) 04/30/2023   CALCIUM 9.9 04/30/2023   PROT 7.0 04/30/2023   ALBUMIN 3.7 04/30/2023   AST 35 04/30/2023   ALT 16 04/30/2023   ALKPHOS 158 (H) 04/30/2023   BILITOT 0.6 04/30/2023   GFRNONAA 44 (L) 04/30/2023   GFRAA 57 (L) 10/01/2019    Lab Results  Component Value Date   WBC 1.7 (L) 04/30/2023   NEUTROABS 0.9 (L) 04/30/2023   HGB 9.1 (L) 04/30/2023   HCT 28.7 (L) 04/30/2023   MCV 98.3 04/30/2023   PLT 203 04/30/2023     STUDIES: DG Humerus Left Result Date: 04/25/2023 CLINICAL DATA:  Metastatic disease with mid humeral fracture EXAM: LEFT HUMERUS - 2+ VIEW COMPARISON:  PET-CT March 25, 2023 FINDINGS: Lytic destructive lesion involving the mid humeral diaphyseal region with a complete transverse nondisplaced fracture of the mid humeral diaphysis with near anatomic alignment consistent with a pathologic fracture. Small lytic changes are also noted within the subcapital region and distal humeral metaphyseal diaphyseal region IMPRESSION: Pathologic fracture mid humerus as described Electronically Signed   By: Fredrich Jefferson M.D.   On: 04/25/2023 16:07   DG Shoulder Left Result Date:  04/25/2023 CLINICAL DATA:  Left humeral pain.  History of metastatic disease EXAM: LEFT SHOULDER - 2+ VIEW COMPARISON:  PET-CT March 25, 2023 FINDINGS: Lytic destructive bone lesion involving the mid humeral diaphyseal region measuring about 5.1 cm  longitudinal diameter by 1.6 cm in transverse diameter with a complete mid transverse fracture consistent with a pathologic fracture with near anatomic alignment. IMPRESSION: Pathologic fracture left mid humeral diaphyseal region as described Electronically Signed   By: Fredrich Jefferson M.D.   On: 04/25/2023 16:06      ONCOLOGY HISTORY: Patient initially received neoadjuvant chemotherapy with Adriamycin and Cytoxan followed by weekly Taxol. She only received 4 cycles of weekly Taxol prior to discontinuation of treatment on October 31, 2017 secondary to persistent peripheral neuropathy.  She ultimately required mastectomy and final pathology noted 5 of 6 lymph nodes positive for disease.  Patient completed adjuvant XRT in mid April 2020.  Pain initiated letrozole in May 2020, this was discontinued secondary to side effects and patient was started on anastrozole.  Nuclear medicine bone scan on June 19, 2019 revealed metastatic disease and anastrozole was subsequently discontinued.  PET scan results from October 02, 2021 revealed no clear evidence of metastatic progression.  PET scan results from June 30, 2019 reviewed independently with metastatic bony disease, but no obvious evidence of visceral disease.  MRI of the brain on August 24, 2019 reviewed independently with no obvious evidence of metastatic disease.   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  Patient's CA 27-29 continues to trend up and her most recent result was 786.4.  Today's result is pending.  Recently, patient  did not receive any treatment other than Aromasin in nearly 3 months and treatment over the last 6-8 months has been intermittent at best.  Xgeva has been permanently discontinued given her issues with her mandible.  After lengthy discussion with the patient, patient did not wish to restart Afinitor given multiple hospital admissions, but based on the MAINTAIN trial she agreed to rechallenge with ribociclib after previously receiving a CDK 4/6 inhibitor.  Despite chronic neutropenia, will continue with treatment for 21 days with 7 days off.  Return to clinic in 4 weeks for laboratory work, further evaluation, and continuation of treatment.  Appreciate clinical pharmacy input. Fractured humerus: Pathological.  Continue XRT as planned. Constipation: Patient does not complain of this today.  Patient has been instructed she needs a regular daily bowel regimen. Peripheral neuropathy: Chronic and unchanged. History of pulmonary embolus: Patient was diagnosed with a small pulmonary embolus on January 10, 2018.  She is no longer on anticoagulation.  There was no evidence of recurrent PE on CT scan from October 27, 2019. Osteopenia: Patient's most recent bone mineral density on March 03, 2019 reported a T score of -1.8 which is only mildly decreased from 1 year prior when the reported T score was -1.6.  Continue calcium and vitamin D supplementation.  Patient no longer will receive bisphosphonates.   Left hip/flank pain: MRI of the lumbar and sacral spine revealed multiple compression and insufficiency fractures along with metastatic disease.  Patient has completed XRT.  Continue tramadol and/or Norco as needed.   Anemia: Hemoglobin slowly trending down and is now 9.1, monitor. Neutropenia: Chronic and unchanged.  Patient's ANC is 0.9.  Proceed cautiously with treatment as above.   Renal insufficiency: Slightly improved.  Patient's creatinine is 1.28.  Monitor. Hypokalemia: Resolved.  Continue oral potassium  supplementation. Dental pathology: Significantly improved.  Continue follow up with Dr. Tanda Falter.  Patient has discontinued hyperbaric treatments at [facility].  No further Xgeva  as above. Lymphedema: Chronic and unchanged.  Follow-up with occupational therapy as scheduled. Coping/anxiety: Continue Xanax and trazodone at night.  Patient has been instructed to hold trazodone if she takes Norco for pain.  Patient was also given a referral to [facility]  Cough/congestion: Resolved.  Patient expressed understanding and was in agreement with this plan. She also understands that She can call clinic at any time with any questions, concerns, or complaints.    Cancer Staging  Breast cancer, stage 2, left (HCC) Staging form: Breast, AJCC 8th Edition - Clinical stage from 07/30/2017: Stage IIA (cT2, cN1, cM0, G2, ER+, PR+, HER2-) - Signed by Shellie Dials, MD on 07/30/2017 Histologic grading system: 3 grade system Laterality: Left   Shellie Dials, MD   04/30/2023 10:34 AM

## 2023-04-30 NOTE — Progress Notes (Signed)
 Clinical Pharmacist Practitioner Clinic [facility]  [phone] [phone]  [name], [name], [name], [name] Jim Like, RN as Registered Nurse Orlie Dakin Tollie Pizza, MD as Consulting Physician (Oncology) Carmina Miller, MD as Referring Physician (Radiation Oncology) Chriss Driver, RN as Registered Nurse   Name of the patient: Alicia [name]  [id number]  [birth year]   Date of visit: 04/30/23  HPI: Patient is a 73 y.o. female with metastatic ER+ HER2- breast cancer. Previously treated with palbociclib, but treatment was changed to Afinitor (everolimus) and exemestane due to disease progression 03/2022. Everolimus 10 mg held on 05/22/22 due to potential drug rash, patient started on steroid taper. Rash cleared and patient resumed everolimus 10 mg on 05/31/22. Patient called to report rash return on 06/04/22. Treatment was again held and prescription for hydrocortisone 2.5% cream sent in. Patient resumed everolimus 7.5mg  on 06/18/22 along with loratidine. Patient was admitted to [facility] hospital for altered mental status, dehydration, and weakness on 07/18/22, everolimus and exemestane were held with admission and at discharge. Patient resumed everolimus (at 5mg ) and exemestane on 09/20/22. Everolimus was again held on 10/05/22 due to weakness and fatigue, everolimus 5mg  was resumed on 10/08/22. Everolimus held again on 11/10/22. Patient was in the hospital 11/26/22-12/03/22 for pneumonia. Patient started ribociclib on 03/05/23.   Reason for Consult: Oral chemotherapy follow-up for ribociclib therapy.   PAST MEDICAL HISTORY: Past Medical History:  Diagnosis Date   Acute hypoxic respiratory failure (HCC) 11/26/2022   AKI (acute kidney injury) (HCC) 07/18/2022   Anxiety    Breast cancer, left (HCC) 07/2017   Depression    Electrolyte abnormality 07/18/2022    Elevated troponin 02/17/2015   Hypercalcemia 11/26/2022   Hypertension    Hypothyroidism    Personal history of chemotherapy 2019   LEFT mastectomy-chemo before   Personal history of radiation therapy 03/2018   LEFT mastectomy   Rapid heart rate    Thyroid disease     HEMATOLOGY/ONCOLOGY HISTORY:  Oncology History Overview Note  Patient is a 73 year old female who recently self palpated a mass on her left breast.  Subsequent imaging and biopsy revealed the above-stated breast cancer.  Case was also discussed extensively at case conference.  Given the size and the stage of patient's malignancy, she will benefit from neoadjuvant chemotherapy using Adriamycin, Cytoxan, and Taxol.  Patient will also require Neulasta support.  Will get CT scan of the chest, abdomen, and pelvis to assess for any metastatic disease.  Patient will also require port placement and MUGA prior to initiating treatment  CT abdomen/pelvis/chest did not reveal any suspicious lesions concerning for metastatic disease. (08/01/17)  Port-A-Cath placed on 08/07/2017.  Cycle 1 day 1 of AC was given on 08/08/17.     Breast cancer, stage 2, left (HCC)  07/24/2017 Initial Diagnosis   Breast cancer, stage 2, left (HCC)   07/30/2017 Cancer Staging   Staging form: Breast, AJCC 8th Edition - Clinical stage from 07/30/2017: Stage IIA (cT2, cN1, cM0, G2, ER+, PR+, HER2-) - Signed by Jeralyn Ruths, MD on 07/30/2017   08/08/2017 - 10/31/2017 Chemotherapy   The patient had DOXOrubicin (ADRIAMYCIN) chemo injection 130 mg, 60 mg/m2 = 130 mg, Intravenous,  Once, 4 of 4 cycles Administration: 130 mg (08/08/2017), 130 mg (08/22/2017), 130 mg (09/05/2017), 130 mg (09/19/2017) palonosetron (ALOXI) injection 0.25 mg, 0.25 mg, Intravenous,  Once, 4 of 4 cycles Administration: 0.25 mg (08/08/2017), 0.25  mg (08/22/2017), 0.25 mg (09/05/2017), 0.25 mg (09/19/2017) pegfilgrastim-cbqv (UDENYCA) injection 6 mg, 6 mg, Subcutaneous, Once, 4 of 4  cycles Administration: 6 mg (08/09/2017), 6 mg (08/23/2017), 6 mg (09/06/2017), 6 mg (09/20/2017) cyclophosphamide (CYTOXAN) 1,300 mg in sodium chloride 0.9 % 250 mL chemo infusion, 600 mg/m2 = 1,300 mg, Intravenous,  Once, 4 of 4 cycles Administration: 1,300 mg (08/08/2017), 1,300 mg (08/22/2017), 1,300 mg (09/05/2017), 1,300 mg (09/19/2017) PACLitaxel (TAXOL) 174 mg in sodium chloride 0.9 % 250 mL chemo infusion (</= 80mg /m2), 80 mg/m2 = 174 mg, Intravenous,  Once, 4 of 12 cycles Dose modification: 72 mg/m2 (original dose 80 mg/m2, Cycle 6, Reason: Dose not tolerated) Administration: 174 mg (10/03/2017), 156 mg (10/17/2017), 156 mg (10/24/2017), 156 mg (10/31/2017) fosaprepitant (EMEND) 150 mg, dexamethasone (DECADRON) 12 mg in sodium chloride 0.9 % 145 mL IVPB, , Intravenous,  Once, 4 of 4 cycles Administration:  (08/08/2017),  (08/22/2017),  (09/05/2017),  (09/19/2017)  for chemotherapy treatment.      ALLERGIES:  is allergic to sulfa antibiotics.  MEDICATIONS:  Current Outpatient Medications  Medication Sig Dispense Refill   acetaminophen (TYLENOL) 500 MG tablet Take 1,000 mg by mouth every 6 (six) hours as needed for moderate pain or headache.      ALPRAZolam (XANAX) 0.5 MG tablet Take 1 tablet (0.5 mg total) by mouth 2 (two) times daily as needed for anxiety. 60 tablet 1   amoxicillin-clavulanate (AUGMENTIN) 875-125 MG tablet Take 1 tablet by mouth 2 (two) times daily. (Patient not taking: Reported on 04/30/2023) 14 tablet 0   ascorbic acid (VITAMIN C) 500 MG tablet Take 500 mg by mouth daily.     aspirin EC 81 MG tablet Take 81 mg by mouth at bedtime.      Cholecalciferol (VITAMIN D3) 125 MCG (5000 UT) CAPS Take 5,000 Units by mouth 2 (two) times a day.     exemestane (AROMASIN) 25 MG tablet Take 1 tablet (25 mg total) by mouth daily after breakfast. 30 tablet 3   feeding supplement (ENSURE ENLIVE / ENSURE PLUS) LIQD Take 237 mLs by mouth 2 (two) times daily between meals. (Patient not taking: Reported  on 04/30/2023)     HYDROcodone-acetaminophen (NORCO/VICODIN) 5-325 MG tablet Take 1 tablet by mouth every 6 (six) hours as needed for moderate pain (pain score 4-6). 30 tablet 0   levothyroxine (SYNTHROID) 100 MCG tablet Take 1 tablet (100 mcg total) by mouth daily before breakfast.     linaclotide (LINZESS) 145 MCG CAPS capsule Take 145 mcg by mouth daily before breakfast.     loratadine (CLARITIN) 10 MG tablet Take 10 mg by mouth daily. Take to reduce risk of rash from everolimus.     magnesium oxide (MAG-OX) 400 (240 Mg) MG tablet Take 400 mg by mouth daily.     midodrine (PROAMATINE) 10 MG tablet Take 1 tablet (10 mg total) by mouth 3 (three) times daily with meals. (Patient taking differently: Take 10 mg by mouth as needed (low bp).) 90 tablet 1   Multiple Vitamin (MULTIVITAMIN WITH MINERALS) TABS tablet Take 1 tablet by mouth daily.     ondansetron (ZOFRAN) 8 MG tablet Take 1 tablet (8 mg total) by mouth every 8 (eight) hours as needed. 30 tablet 1   pantoprazole (PROTONIX) 20 MG tablet TAKE 1 TABLET(20 MG) BY MOUTH DAILY 90 tablet 0   potassium chloride SA (KLOR-CON M) 20 MEQ tablet Take 2 tablets (40 mEq total) by mouth 2 (two) times daily. 180 tablet 3   potassium chloride  SA (KLOR-CON M) 20 MEQ tablet Take 1 tablet (20 mEq total) by mouth 2 (two) times daily. (Patient not taking: Reported on 04/30/2023) 60 tablet 3   Probiotic Product (ALIGN) 4 MG CAPS Take 1 capsule by mouth daily.     prochlorperazine (COMPAZINE) 10 MG tablet TAKE 1 TABLET(10 MG) BY MOUTH EVERY 6 HOURS AS NEEDED FOR NAUSEA OR VOMITING 30 tablet 2   ribociclib succ (KISQALI 400MG  DAILY DOSE) 200 MG Therapy Pack Take 2 tablets (400 mg total) by mouth daily. Take for 21 days on, 7 days off, repeat every 28 days. 42 tablet 0   rosuvastatin (CRESTOR) 5 MG tablet Take 1 tablet (5 mg total) by mouth daily. 90 tablet 3   traMADol (ULTRAM) 50 MG tablet Take 1 tablet (50 mg total) by mouth every 6 (six) hours as needed. 60 tablet 1    traZODone (DESYREL) 50 MG tablet Take 1 tablet (50 mg total) by mouth at bedtime. 30 tablet 2   No current facility-administered medications for this visit.    VITAL SIGNS: There were no vitals taken for this visit. There were no vitals filed for this visit.  Estimated body mass index is 27.79 kg/m as calculated from the following:   Height as of 04/02/23: 5\' 6"  (1.676 m).   Weight as of an earlier encounter on 04/30/23: 78.1 kg (172 lb 3.2 oz).  LABS: CBC:    Component Value Date/Time   WBC 1.7 (L) 04/30/2023 0933   HGB 9.1 (L) 04/30/2023 0933   HGB 10.5 (L) 10/08/2022 0902   HGB 14.2 02/11/2011 1349   HCT 28.7 (L) 04/30/2023 0933   HCT 40.9 02/11/2011 1349   PLT 203 04/30/2023 0933   PLT 249 10/08/2022 0902   PLT 151 02/11/2011 1349   MCV 98.3 04/30/2023 0933   MCV 89 02/11/2011 1349   NEUTROABS 0.9 (L) 04/30/2023 0933   LYMPHSABS 0.3 (L) 04/30/2023 0933   MONOABS 0.4 04/30/2023 0933   EOSABS 0.0 04/30/2023 0933   BASOSABS 0.0 04/30/2023 0933   Comprehensive Metabolic Panel:    Component Value Date/Time   NA 138 04/30/2023 0934   NA 142 02/11/2011 1349   K 4.5 04/30/2023 0934   K 3.8 02/11/2011 1349   CL 106 04/30/2023 0934   CL 107 02/11/2011 1349   CO2 23 04/30/2023 0934   CO2 24 02/11/2011 1349   BUN 20 04/30/2023 0934   BUN 14 02/11/2011 1349   CREATININE 1.28 (H) 04/30/2023 0934   CREATININE 0.65 02/11/2011 1349   GLUCOSE 89 04/30/2023 0934   GLUCOSE 98 02/11/2011 1349   CALCIUM 9.9 04/30/2023 0934   CALCIUM 9.0 02/11/2011 1349   AST 35 04/30/2023 0934   ALT 16 04/30/2023 0934   ALT 36 02/11/2011 1349   ALKPHOS 158 (H) 04/30/2023 0934   ALKPHOS 122 02/11/2011 1349   BILITOT 0.6 04/30/2023 0934   PROT 7.0 04/30/2023 0934   PROT 7.0 02/11/2011 1349   ALBUMIN 3.7 04/30/2023 0934   ALBUMIN 4.1 02/11/2011 1349     Present during today's visit: patient and her daughter-in-law  Assessment and Plan: CMP/CBC reviewed, continue ribociclib 400mg   21on/7off Continue to monitor hgb, trending down, could be from radiation and ribociclib CA 27.29 today pending  Patient recently found to have fractured humerus, currently in soft cast, Dr. Aggie Cosier plans radiation to that area    Oral Chemotherapy Side Effect/Intolerance:  Fatigue: unchanged  No reported rash, diarrhea, or nausea  Oral Chemotherapy Adherence: No mised doses reported  Ms. Wallman patient barriers to medication adherence identified.   New medications: None reported  Medication Access Issues: PAP approved until 01/15/24  Patient expressed understanding and was in agreement with this plan. She also understands that She can call clinic at any time with any questions, concerns, or complaints.   Follow-up plan: RTC in 4 weeks  Thank you for allowing me to participate in the care of this very pleasant patient.   Time Total: 15 mins  Visit consisted of counseling and education on dealing with issues of symptom management in the setting of serious and potentially life-threatening illness.Greater than 50%  of this time was spent counseling and coordinating care related to the above assessment and plan.  Signed by: Aleric Froelich N. Estefana Taylor, PharmD, Lorraine Roses, CPP Hematology/Oncology Clinical Pharmacist Practitioner [facility] [phone]  04/30/2023 2:46 PM

## 2023-05-01 ENCOUNTER — Other Ambulatory Visit: Payer: Self-pay

## 2023-05-01 ENCOUNTER — Ambulatory Visit
Admission: RE | Admit: 2023-05-01 | Discharge: 2023-05-01 | Disposition: A | Discharge status: Home / Self Care | Source: Ambulatory Visit | Attending: Radiation Oncology | Admitting: Radiation Oncology

## 2023-05-01 DIAGNOSIS — C50911 Malignant neoplasm of unspecified site of right female breast: Secondary | ICD-10-CM | POA: Diagnosis not present

## 2023-05-01 DIAGNOSIS — Z170 Estrogen receptor positive status [ER+]: Secondary | ICD-10-CM | POA: Diagnosis not present

## 2023-05-01 DIAGNOSIS — C50412 Malignant neoplasm of upper-outer quadrant of left female breast: Secondary | ICD-10-CM | POA: Diagnosis not present

## 2023-05-01 DIAGNOSIS — C7951 Secondary malignant neoplasm of bone: Secondary | ICD-10-CM | POA: Diagnosis not present

## 2023-05-01 DIAGNOSIS — Z510 Encounter for antineoplastic radiation therapy: Secondary | ICD-10-CM | POA: Diagnosis not present

## 2023-05-01 LAB — RAD ONC ARIA SESSION SUMMARY
Course Elapsed Days: 22
Plan Fractions Treated to Date: 5
Plan Prescribed Dose Per Fraction: 3 Gy
Plan Total Fractions Prescribed: 10
Plan Total Prescribed Dose: 30 Gy
Reference Point Dosage Given to Date: 15 Gy
Reference Point Session Dosage Given: 3 Gy
Session Number: 15

## 2023-05-01 LAB — CANCER ANTIGEN 27.29: CA 27.29: 872.5 U/mL — ABNORMAL HIGH (ref 0.0–38.6)

## 2023-05-02 ENCOUNTER — Ambulatory Visit
Admission: RE | Admit: 2023-05-02 | Discharge: 2023-05-02 | Disposition: A | Discharge status: Home / Self Care | Source: Ambulatory Visit | Attending: Radiation Oncology | Admitting: Radiation Oncology

## 2023-05-02 ENCOUNTER — Other Ambulatory Visit: Payer: Self-pay

## 2023-05-02 DIAGNOSIS — Z510 Encounter for antineoplastic radiation therapy: Secondary | ICD-10-CM | POA: Diagnosis not present

## 2023-05-02 DIAGNOSIS — C50911 Malignant neoplasm of unspecified site of right female breast: Secondary | ICD-10-CM | POA: Diagnosis not present

## 2023-05-02 DIAGNOSIS — C7951 Secondary malignant neoplasm of bone: Secondary | ICD-10-CM | POA: Diagnosis not present

## 2023-05-02 DIAGNOSIS — C50412 Malignant neoplasm of upper-outer quadrant of left female breast: Secondary | ICD-10-CM | POA: Diagnosis not present

## 2023-05-02 DIAGNOSIS — Z170 Estrogen receptor positive status [ER+]: Secondary | ICD-10-CM | POA: Diagnosis not present

## 2023-05-02 LAB — RAD ONC ARIA SESSION SUMMARY
Course Elapsed Days: 23
Plan Fractions Treated to Date: 1
Plan Fractions Treated to Date: 6
Plan Prescribed Dose Per Fraction: 3 Gy
Plan Prescribed Dose Per Fraction: 8 Gy
Plan Total Fractions Prescribed: 1
Plan Total Fractions Prescribed: 10
Plan Total Prescribed Dose: 30 Gy
Plan Total Prescribed Dose: 8 Gy
Reference Point Dosage Given to Date: 18 Gy
Reference Point Dosage Given to Date: 8 Gy
Reference Point Session Dosage Given: 3 Gy
Reference Point Session Dosage Given: 8 Gy
Session Number: 16

## 2023-05-03 ENCOUNTER — Other Ambulatory Visit: Payer: Self-pay

## 2023-05-03 ENCOUNTER — Ambulatory Visit

## 2023-05-03 ENCOUNTER — Ambulatory Visit
Admission: RE | Admit: 2023-05-03 | Discharge: 2023-05-03 | Disposition: A | Discharge status: Home / Self Care | Source: Ambulatory Visit | Attending: Radiation Oncology | Admitting: Radiation Oncology

## 2023-05-03 DIAGNOSIS — Z510 Encounter for antineoplastic radiation therapy: Secondary | ICD-10-CM | POA: Diagnosis not present

## 2023-05-03 DIAGNOSIS — C7951 Secondary malignant neoplasm of bone: Secondary | ICD-10-CM | POA: Diagnosis not present

## 2023-05-03 DIAGNOSIS — C50911 Malignant neoplasm of unspecified site of right female breast: Secondary | ICD-10-CM | POA: Diagnosis not present

## 2023-05-03 LAB — RAD ONC ARIA SESSION SUMMARY
Course Elapsed Days: 24
Plan Fractions Treated to Date: 7
Plan Prescribed Dose Per Fraction: 3 Gy
Plan Total Fractions Prescribed: 10
Plan Total Prescribed Dose: 30 Gy
Reference Point Dosage Given to Date: 21 Gy
Reference Point Session Dosage Given: 3 Gy
Session Number: 17

## 2023-05-06 ENCOUNTER — Ambulatory Visit
Admission: RE | Admit: 2023-05-06 | Discharge: 2023-05-06 | Disposition: A | Discharge status: Home / Self Care | Source: Ambulatory Visit | Attending: Radiation Oncology | Admitting: Radiation Oncology

## 2023-05-06 ENCOUNTER — Other Ambulatory Visit: Payer: Self-pay

## 2023-05-06 DIAGNOSIS — C50911 Malignant neoplasm of unspecified site of right female breast: Secondary | ICD-10-CM | POA: Diagnosis not present

## 2023-05-06 DIAGNOSIS — C7951 Secondary malignant neoplasm of bone: Secondary | ICD-10-CM | POA: Diagnosis not present

## 2023-05-06 DIAGNOSIS — Z510 Encounter for antineoplastic radiation therapy: Secondary | ICD-10-CM | POA: Diagnosis not present

## 2023-05-06 LAB — RAD ONC ARIA SESSION SUMMARY
Course Elapsed Days: 27
Plan Fractions Treated to Date: 8
Plan Prescribed Dose Per Fraction: 3 Gy
Plan Total Fractions Prescribed: 10
Plan Total Prescribed Dose: 30 Gy
Reference Point Dosage Given to Date: 24 Gy
Reference Point Session Dosage Given: 3 Gy
Session Number: 18

## 2023-05-07 ENCOUNTER — Ambulatory Visit
Admission: RE | Admit: 2023-05-07 | Discharge: 2023-05-07 | Disposition: A | Discharge status: Home / Self Care | Source: Ambulatory Visit | Attending: Radiation Oncology | Admitting: Radiation Oncology

## 2023-05-07 ENCOUNTER — Ambulatory Visit

## 2023-05-07 ENCOUNTER — Other Ambulatory Visit: Payer: Self-pay

## 2023-05-07 DIAGNOSIS — C7951 Secondary malignant neoplasm of bone: Secondary | ICD-10-CM | POA: Diagnosis not present

## 2023-05-07 DIAGNOSIS — Z510 Encounter for antineoplastic radiation therapy: Secondary | ICD-10-CM | POA: Diagnosis not present

## 2023-05-07 DIAGNOSIS — C50911 Malignant neoplasm of unspecified site of right female breast: Secondary | ICD-10-CM | POA: Diagnosis not present

## 2023-05-07 LAB — RAD ONC ARIA SESSION SUMMARY
Course Elapsed Days: 28
Plan Fractions Treated to Date: 9
Plan Prescribed Dose Per Fraction: 3 Gy
Plan Total Fractions Prescribed: 10
Plan Total Prescribed Dose: 30 Gy
Reference Point Dosage Given to Date: 27 Gy
Reference Point Session Dosage Given: 3 Gy
Session Number: 19

## 2023-05-08 ENCOUNTER — Other Ambulatory Visit: Payer: Self-pay

## 2023-05-08 ENCOUNTER — Ambulatory Visit
Admission: RE | Admit: 2023-05-08 | Discharge: 2023-05-08 | Disposition: A | Discharge status: Home / Self Care | Source: Ambulatory Visit | Attending: Radiation Oncology | Admitting: Radiation Oncology

## 2023-05-08 DIAGNOSIS — C50911 Malignant neoplasm of unspecified site of right female breast: Secondary | ICD-10-CM | POA: Diagnosis not present

## 2023-05-08 DIAGNOSIS — C7951 Secondary malignant neoplasm of bone: Secondary | ICD-10-CM | POA: Diagnosis not present

## 2023-05-08 DIAGNOSIS — Z170 Estrogen receptor positive status [ER+]: Secondary | ICD-10-CM | POA: Diagnosis not present

## 2023-05-08 DIAGNOSIS — Z510 Encounter for antineoplastic radiation therapy: Secondary | ICD-10-CM | POA: Diagnosis not present

## 2023-05-08 DIAGNOSIS — C50412 Malignant neoplasm of upper-outer quadrant of left female breast: Secondary | ICD-10-CM | POA: Diagnosis not present

## 2023-05-08 LAB — RAD ONC ARIA SESSION SUMMARY
Course Elapsed Days: 29
Plan Fractions Treated to Date: 10
Plan Prescribed Dose Per Fraction: 3 Gy
Plan Total Fractions Prescribed: 10
Plan Total Prescribed Dose: 30 Gy
Reference Point Dosage Given to Date: 30 Gy
Reference Point Session Dosage Given: 3 Gy
Session Number: 20

## 2023-05-09 NOTE — Radiation Completion Notes (Signed)
 Patient Name: Alicia [name], Alicia [name] MRN: [id number] Date of Birth: 01-07-1951 Referring Physician: Theron Flavin, M.D. Date of Service: 2023-05-09 Radiation Oncologist: [name], M.D. [facility]                             RADIATION ONCOLOGY END OF TREATMENT NOTE     Diagnosis: C79.51 Secondary malignant neoplasm of bone Staging on 2017-07-30: Breast cancer, stage 2, left (HCC) T=cT2, N=cN1, M=cM0 Intent: Palliative     HPI: Patient is a 73 year old female well-known to department having received palliative radiation therapy to her left iliac wing back in 2020.  She also received left chest wall and peripheral lymphatic radiation therapy after neoadjuvant chemotherapy and then left modified radical mastectomy..  She has received multiple courses of palliative radiation therapy and a year ago received palliative radiation therapy again to her left hip.  She recently had a repeat PET CT scan and note of oh has not been formally read shows again progressive disease in her left pelvis sacrum and hip.  She complains of increasing pain in this region and difficulty ambulating and she was referred back for consideration of further palliative treatment.      ==========DELIVERED PLANS==========  First Treatment Date: 2023-04-09 Last Treatment Date: 2023-05-08   Plan Name: Pelvis_L Site: Hip, Left Technique: 3D Mode: Photon Dose Per Fraction: 3 Gy Prescribed Dose (Delivered / Prescribed): 30 Gy / 30 Gy Prescribed Fxs (Delivered / Prescribed): 10 / 10   Plan Name: Spine_T Site: Thoracic Spine Technique: 3D Mode: Photon Dose Per Fraction: 3 Gy Prescribed Dose (Delivered / Prescribed): 30 Gy / 30 Gy Prescribed Fxs (Delivered / Prescribed): 10 / 10   Plan Name: Ext_Lt_Hum Site: Humerus, Left Technique: Isodose Plan Mode: Photon Dose Per Fraction: 8 Gy Prescribed Dose (Delivered / Prescribed): 8 Gy / 8 Gy Prescribed Fxs (Delivered / Prescribed): 1 / 1      ==========ON TREATMENT VISIT DATES========== 2023-04-09, 2023-04-16, 2023-04-25, 2023-04-29, 2023-05-07     ==========UPCOMING VISITS========== 06/12/2023 [facility] OFFICE VISIT Ronald Cockayne, NP  05/28/2023 [facility] RAD ONCOLOGY FOLLOW UP 30 [name], MD  05/28/2023 [facility] MED ONC PHARMACY CLINIC Thaddeus Filippo, RPH-CPP  05/28/2023 [facility] MED ONC EST PT 15 Shellie Dials, MD  05/27/2023 [facility] MED ONC LAB [facility]        ==========APPENDIX - ON TREATMENT VISIT NOTES==========   See weekly On Treatment Notes in Epic for details in the Media tab (listed as Progress notes on the On Treatment Visit Dates listed above).

## 2023-05-13 DIAGNOSIS — M84422D Pathological fracture, left humerus, subsequent encounter for fracture with routine healing: Secondary | ICD-10-CM | POA: Diagnosis not present

## 2023-05-15 ENCOUNTER — Telehealth: Payer: Self-pay | Admitting: *Deleted

## 2023-05-15 DIAGNOSIS — M84422A Pathological fracture, left humerus, initial encounter for fracture: Secondary | ICD-10-CM | POA: Diagnosis not present

## 2023-05-15 NOTE — Telephone Encounter (Signed)
 She called to say that the cast is too tight and she has swelling of the arm as well as having blisters. She wanted to see what the oncology says to do. I spoke to [place] and she said for the pt. To go back to [facility]. She understands.

## 2023-05-21 ENCOUNTER — Ambulatory Visit: Payer: Medicare Other | Admitting: Cardiology

## 2023-05-24 ENCOUNTER — Ambulatory Visit: Admitting: Cardiology

## 2023-05-27 ENCOUNTER — Inpatient Hospital Stay

## 2023-05-27 DIAGNOSIS — G629 Polyneuropathy, unspecified: Secondary | ICD-10-CM | POA: Insufficient documentation

## 2023-05-27 DIAGNOSIS — M84422A Pathological fracture, left humerus, initial encounter for fracture: Secondary | ICD-10-CM | POA: Insufficient documentation

## 2023-05-27 DIAGNOSIS — M549 Dorsalgia, unspecified: Secondary | ICD-10-CM | POA: Insufficient documentation

## 2023-05-27 DIAGNOSIS — R5383 Other fatigue: Secondary | ICD-10-CM | POA: Insufficient documentation

## 2023-05-27 DIAGNOSIS — Z86711 Personal history of pulmonary embolism: Secondary | ICD-10-CM | POA: Insufficient documentation

## 2023-05-27 DIAGNOSIS — R531 Weakness: Secondary | ICD-10-CM | POA: Insufficient documentation

## 2023-05-27 DIAGNOSIS — K5900 Constipation, unspecified: Secondary | ICD-10-CM | POA: Insufficient documentation

## 2023-05-27 DIAGNOSIS — M8580 Other specified disorders of bone density and structure, unspecified site: Secondary | ICD-10-CM | POA: Insufficient documentation

## 2023-05-27 DIAGNOSIS — C7951 Secondary malignant neoplasm of bone: Secondary | ICD-10-CM | POA: Insufficient documentation

## 2023-05-27 DIAGNOSIS — D709 Neutropenia, unspecified: Secondary | ICD-10-CM | POA: Insufficient documentation

## 2023-05-27 DIAGNOSIS — D649 Anemia, unspecified: Secondary | ICD-10-CM | POA: Insufficient documentation

## 2023-05-27 DIAGNOSIS — Z170 Estrogen receptor positive status [ER+]: Secondary | ICD-10-CM | POA: Insufficient documentation

## 2023-05-27 DIAGNOSIS — R739 Hyperglycemia, unspecified: Secondary | ICD-10-CM | POA: Insufficient documentation

## 2023-05-27 DIAGNOSIS — C50812 Malignant neoplasm of overlapping sites of left female breast: Secondary | ICD-10-CM | POA: Insufficient documentation

## 2023-05-27 DIAGNOSIS — Z87891 Personal history of nicotine dependence: Secondary | ICD-10-CM | POA: Insufficient documentation

## 2023-05-27 DIAGNOSIS — I890 Lymphedema, not elsewhere classified: Secondary | ICD-10-CM | POA: Insufficient documentation

## 2023-05-27 DIAGNOSIS — Z9012 Acquired absence of left breast and nipple: Secondary | ICD-10-CM | POA: Insufficient documentation

## 2023-05-27 DIAGNOSIS — E0965 Drug or chemical induced diabetes mellitus with hyperglycemia: Secondary | ICD-10-CM | POA: Diagnosis not present

## 2023-05-27 DIAGNOSIS — C50919 Malignant neoplasm of unspecified site of unspecified female breast: Secondary | ICD-10-CM | POA: Diagnosis not present

## 2023-05-27 DIAGNOSIS — C50912 Malignant neoplasm of unspecified site of left female breast: Secondary | ICD-10-CM

## 2023-05-27 LAB — CMP (CANCER CENTER ONLY)
ALT: 18 U/L (ref 0–44)
AST: 37 U/L (ref 15–41)
Albumin: 3.8 g/dL (ref 3.5–5.0)
Alkaline Phosphatase: 159 U/L — ABNORMAL HIGH (ref 38–126)
Anion gap: 11 (ref 5–15)
BUN: 20 mg/dL (ref 8–23)
CO2: 25 mmol/L (ref 22–32)
Calcium: 10.4 mg/dL — ABNORMAL HIGH (ref 8.9–10.3)
Chloride: 100 mmol/L (ref 98–111)
Creatinine: 1.53 mg/dL — ABNORMAL HIGH (ref 0.44–1.00)
GFR, Estimated: 35 mL/min — ABNORMAL LOW (ref >60)
Glucose, Bld: 104 mg/dL — ABNORMAL HIGH (ref 70–99)
Potassium: 4.6 mmol/L (ref 3.5–5.1)
Sodium: 136 mmol/L (ref 135–145)
Total Bilirubin: 0.7 mg/dL (ref 0.0–1.2)
Total Protein: 7.3 g/dL (ref 6.5–8.1)

## 2023-05-27 LAB — CBC WITH DIFFERENTIAL/PLATELET
Abs Immature Granulocytes: 0.01 10*3/uL (ref 0.00–0.07)
Basophils Absolute: 0 10*3/uL (ref 0.0–0.1)
Basophils Relative: 2 %
Eosinophils Absolute: 0 10*3/uL (ref 0.0–0.5)
Eosinophils Relative: 1 %
HCT: 31.3 % — ABNORMAL LOW (ref 36.0–46.0)
Hemoglobin: 10.5 g/dL — ABNORMAL LOW (ref 12.0–15.0)
Immature Granulocytes: 1 %
Lymphocytes Relative: 13 %
Lymphs Abs: 0.2 10*3/uL — ABNORMAL LOW (ref 0.7–4.0)
MCH: 32.7 pg (ref 26.0–34.0)
MCHC: 33.5 g/dL (ref 30.0–36.0)
MCV: 97.5 fL (ref 80.0–100.0)
Monocytes Absolute: 0.3 10*3/uL (ref 0.1–1.0)
Monocytes Relative: 18 %
Neutro Abs: 1.2 10*3/uL — ABNORMAL LOW (ref 1.7–7.7)
Neutrophils Relative %: 65 %
Platelets: 178 10*3/uL (ref 150–400)
RBC: 3.21 MIL/uL — ABNORMAL LOW (ref 3.87–5.11)
RDW: 19.4 % — ABNORMAL HIGH (ref 11.5–15.5)
WBC: 1.9 10*3/uL — ABNORMAL LOW (ref 4.0–10.5)
nRBC: 0 % (ref 0.0–0.2)

## 2023-05-28 ENCOUNTER — Other Ambulatory Visit (HOSPITAL_COMMUNITY): Payer: Self-pay

## 2023-05-28 ENCOUNTER — Telehealth: Payer: Self-pay | Admitting: Pharmacy Technician

## 2023-05-28 ENCOUNTER — Encounter: Payer: Self-pay | Admitting: Radiation Oncology

## 2023-05-28 ENCOUNTER — Ambulatory Visit
Admission: RE | Admit: 2023-05-28 | Discharge: 2023-05-28 | Disposition: A | Discharge status: Home / Self Care | Source: Ambulatory Visit | Attending: Radiation Oncology | Admitting: Radiation Oncology

## 2023-05-28 ENCOUNTER — Ambulatory Visit: Admitting: Oncology

## 2023-05-28 ENCOUNTER — Inpatient Hospital Stay: Admitting: Pharmacist

## 2023-05-28 ENCOUNTER — Inpatient Hospital Stay (HOSPITAL_BASED_OUTPATIENT_CLINIC_OR_DEPARTMENT_OTHER): Admitting: Oncology

## 2023-05-28 ENCOUNTER — Encounter: Payer: Self-pay | Admitting: Oncology

## 2023-05-28 ENCOUNTER — Ambulatory Visit: Admitting: Pharmacist

## 2023-05-28 VITALS — BP 110/67 | HR 74 | Temp 97.7°F | Resp 17 | Wt 166.0 lb

## 2023-05-28 DIAGNOSIS — C7951 Secondary malignant neoplasm of bone: Secondary | ICD-10-CM

## 2023-05-28 DIAGNOSIS — C50919 Malignant neoplasm of unspecified site of unspecified female breast: Secondary | ICD-10-CM

## 2023-05-28 DIAGNOSIS — E0965 Drug or chemical induced diabetes mellitus with hyperglycemia: Secondary | ICD-10-CM

## 2023-05-28 DIAGNOSIS — C50911 Malignant neoplasm of unspecified site of right female breast: Secondary | ICD-10-CM

## 2023-05-28 LAB — CANCER ANTIGEN 27.29: CA 27.29: 1343.4 U/mL — ABNORMAL HIGH (ref 0.0–38.6)

## 2023-05-28 MED ORDER — PIQRAY (200 MG DAILY DOSE) 200 MG PO TBPK: 200.0000 mg | ORAL_TABLET | Freq: Every day | ORAL | Status: DC

## 2023-05-28 NOTE — Progress Notes (Unsigned)
 Concerns today of arm fracture and "Kisqual not working"

## 2023-05-28 NOTE — Telephone Encounter (Signed)
 Oral Oncology Patient Advocate Encounter   Received notification that prior authorization for Piqray is required.   PA submitted on 05/28/2023 Key [id number] Status is pending     [name], CPhT Oncology Pharmacy Patient Advocate [facility] Direct Number: [phone] Fax: [phone]

## 2023-05-28 NOTE — Progress Notes (Signed)
 Clinical Pharmacist Practitioner Clinic [facility]  [phone] [phone]  Patient Care Team: Theron Flavin, [name], Veryl Gottron, [name] Burnie Cartwright, RN as Registered Nurse Adrian Alba Deadra Everts, MD as Consulting Physician (Oncology) [name], MD as Referring Physician (Radiation Oncology) Luise Saint, RN as Registered Nurse   Name of the patient: Alicia [name]  [id number]  09/20/1950   Date of visit: 05/28/23  HPI: Patient is a 73 y.o. female with progressive metastatic breast cancer, ER/PR positive, HER2 negative, PIK3CA mutation positive. Planned treatment with alpelisib.   Reason for Consult: Patient will be transitioned to treatment with alpelisib    PAST MEDICAL HISTORY: Past Medical History:  Diagnosis Date   Acute hypoxic respiratory failure (HCC) 11/26/2022   AKI (acute kidney injury) (HCC) 07/18/2022   Anxiety    Breast cancer, left (HCC) 07/2017   Depression    Electrolyte abnormality 07/18/2022   Elevated troponin 02/17/2015   Hypercalcemia 11/26/2022   Hypertension    Hypothyroidism    Personal history of chemotherapy 2019   LEFT mastectomy-chemo before   Personal history of radiation therapy 03/2018   LEFT mastectomy   Rapid heart rate    Thyroid  disease     HEMATOLOGY/ONCOLOGY HISTORY:  Oncology History Overview Note  Patient is a 73 year old female who recently self palpated a mass on her left breast.  Subsequent imaging and biopsy revealed the above-stated breast cancer.  Case was also discussed extensively at case conference.  Given the size and the stage of patient's malignancy, she will benefit from neoadjuvant chemotherapy using Adriamycin , Cytoxan , and Taxol .  Patient will also require Neulasta  support.  Will get CT scan of the chest, abdomen, and pelvis to assess for any metastatic disease.  Patient will also require port  placement and MUGA prior to initiating treatment  CT abdomen/pelvis/chest did not reveal any suspicious lesions concerning for metastatic disease. (08/01/17)  Port-A-Cath placed on 08/07/2017.  Cycle 1 day 1 of AC was given on 08/08/17.     Breast cancer, stage 2, left (HCC)  07/24/2017 Initial Diagnosis   Breast cancer, stage 2, left (HCC)   07/30/2017 Cancer Staging   Staging form: Breast, AJCC 8th Edition - Clinical stage from 07/30/2017: Stage IIA (cT2, cN1, cM0, G2, ER+, PR+, HER2-) - Signed by Shellie Dials, MD on 07/30/2017   08/08/2017 - 10/31/2017 Chemotherapy   The patient had DOXOrubicin  (ADRIAMYCIN ) chemo injection 130 mg, 60 mg/m2 = 130 mg, Intravenous,  Once, 4 of 4 cycles Administration: 130 mg (08/08/2017), 130 mg (08/22/2017), 130 mg (09/05/2017), 130 mg (09/19/2017) palonosetron  (ALOXI ) injection 0.25 mg, 0.25 mg, Intravenous,  Once, 4 of 4 cycles Administration: 0.25 mg (08/08/2017), 0.25 mg (08/22/2017), 0.25 mg (09/05/2017), 0.25 mg (09/19/2017) pegfilgrastim -cbqv (UDENYCA ) injection 6 mg, 6 mg, Subcutaneous, Once, 4 of 4 cycles Administration: 6 mg (08/09/2017), 6 mg (08/23/2017), 6 mg (09/06/2017), 6 mg (09/20/2017) cyclophosphamide  (CYTOXAN ) 1,300 mg in sodium chloride  0.9 % 250 mL chemo infusion, 600 mg/m2 = 1,300 mg, Intravenous,  Once, 4 of 4 cycles Administration: 1,300 mg (08/08/2017), 1,300 mg (08/22/2017), 1,300 mg (09/05/2017), 1,300 mg (09/19/2017) PACLitaxel  (TAXOL ) 174 mg in sodium chloride  0.9 % 250 mL chemo infusion (</= 80mg /m2), 80 mg/m2 = 174 mg, Intravenous,  Once, 4 of 12 cycles Dose modification: 72 mg/m2 (original dose 80 mg/m2, Cycle 6, Reason: Dose not tolerated) Administration: 174 mg (10/03/2017), 156 mg (10/17/2017), 156 mg (10/24/2017), 156 mg (10/31/2017) fosaprepitant  (EMEND ) 150  mg, dexamethasone  (DECADRON ) 12 mg in sodium chloride  0.9 % 145 mL IVPB, , Intravenous,  Once, 4 of 4 cycles Administration:  (08/08/2017),  (08/22/2017),  (09/05/2017),  (09/19/2017)  for  chemotherapy treatment.      ALLERGIES:  is allergic to sulfa  antibiotics.  MEDICATIONS:  Current Outpatient Medications  Medication Sig Dispense Refill   acetaminophen  (TYLENOL ) 500 MG tablet Take 1,000 mg by mouth every 6 (six) hours as needed for moderate pain or headache.      ALPRAZolam  (XANAX ) 0.5 MG tablet Take 1 tablet (0.5 mg total) by mouth 2 (two) times daily as needed for anxiety. 60 tablet 1   amoxicillin -clavulanate (AUGMENTIN ) 875-125 MG tablet Take 1 tablet by mouth 2 (two) times daily. 14 tablet 0   ascorbic acid  (VITAMIN C ) 500 MG tablet Take 500 mg by mouth daily.     aspirin  EC 81 MG tablet Take 81 mg by mouth at bedtime.      Cholecalciferol  (VITAMIN D3) 125 MCG (5000 UT) CAPS Take 5,000 Units by mouth 2 (two) times a day.     exemestane  (AROMASIN ) 25 MG tablet Take 1 tablet (25 mg total) by mouth daily after breakfast. 30 tablet 3   feeding supplement (ENSURE ENLIVE / ENSURE PLUS) LIQD Take 237 mLs by mouth 2 (two) times daily between meals.     HYDROcodone -acetaminophen  (NORCO/VICODIN) 5-325 MG tablet Take 1 tablet by mouth every 6 (six) hours as needed for moderate pain (pain score 4-6). (Patient not taking: Reported on 05/28/2023) 30 tablet 0   levothyroxine  (SYNTHROID ) 100 MCG tablet Take 1 tablet (100 mcg total) by mouth daily before breakfast.     linaclotide  (LINZESS ) 145 MCG CAPS capsule Take 145 mcg by mouth daily before breakfast.     loratadine  (CLARITIN ) 10 MG tablet Take 10 mg by mouth daily. Take to reduce risk of rash from everolimus .     magnesium  oxide (MAG-OX) 400 (240 Mg) MG tablet Take 400 mg by mouth daily.     midodrine  (PROAMATINE ) 10 MG tablet Take 1 tablet (10 mg total) by mouth 3 (three) times daily with meals. (Patient taking differently: Take 10 mg by mouth as needed (low bp).) 90 tablet 1   Multiple Vitamin (MULTIVITAMIN WITH MINERALS) TABS tablet Take 1 tablet by mouth daily.     ondansetron  (ZOFRAN ) 8 MG tablet Take 1 tablet (8 mg total) by  mouth every 8 (eight) hours as needed. 30 tablet 1   pantoprazole  (PROTONIX ) 20 MG tablet TAKE 1 TABLET(20 MG) BY MOUTH DAILY 90 tablet 0   potassium chloride  SA (KLOR-CON  M) 20 MEQ tablet Take 2 tablets (40 mEq total) by mouth 2 (two) times daily. 180 tablet 3   potassium chloride  SA (KLOR-CON  M) 20 MEQ tablet Take 1 tablet (20 mEq total) by mouth 2 (two) times daily. (Patient not taking: Reported on 04/30/2023) 60 tablet 3   Probiotic Product (ALIGN) 4 MG CAPS Take 1 capsule by mouth daily.     prochlorperazine  (COMPAZINE ) 10 MG tablet TAKE 1 TABLET(10 MG) BY MOUTH EVERY 6 HOURS AS NEEDED FOR NAUSEA OR VOMITING 30 tablet 2   ribociclib  succ (KISQALI  400MG  DAILY DOSE) 200 MG Therapy Pack Take 2 tablets (400 mg total) by mouth daily. Take for 21 days on, 7 days off, repeat every 28 days. 42 tablet 0   rosuvastatin  (CRESTOR ) 5 MG tablet Take 1 tablet (5 mg total) by mouth daily. 90 tablet 3   traMADol  (ULTRAM ) 50 MG tablet Take 1 tablet (50 mg  total) by mouth every 6 (six) hours as needed. 60 tablet 1   traZODone  (DESYREL ) 50 MG tablet Take 1 tablet (50 mg total) by mouth at bedtime. 30 tablet 2   No current facility-administered medications for this visit.    VITAL SIGNS: There were no vitals taken for this visit. There were no vitals filed for this visit.  Estimated body mass index is 26.79 kg/m as calculated from the following:   Height as of 04/02/23: 5\' 6"  (1.676 m).   Weight as of an earlier encounter on 05/28/23: 75.3 kg (166 lb).  LABS: CBC:    Component Value Date/Time   WBC 1.9 (L) 05/27/2023 1116   HGB 10.5 (L) 05/27/2023 1116   HGB 10.5 (L) 10/08/2022 0902   HGB 14.2 02/11/2011 1349   HCT 31.3 (L) 05/27/2023 1116   HCT 40.9 02/11/2011 1349   PLT 178 05/27/2023 1116   PLT 249 10/08/2022 0902   PLT 151 02/11/2011 1349   MCV 97.5 05/27/2023 1116   MCV 89 02/11/2011 1349   NEUTROABS 1.2 (L) 05/27/2023 1116   LYMPHSABS 0.2 (L) 05/27/2023 1116   MONOABS 0.3 05/27/2023 1116    EOSABS 0.0 05/27/2023 1116   BASOSABS 0.0 05/27/2023 1116   Comprehensive Metabolic Panel:    Component Value Date/Time   NA 136 05/27/2023 1113   NA 142 02/11/2011 1349   K 4.6 05/27/2023 1113   K 3.8 02/11/2011 1349   CL 100 05/27/2023 1113   CL 107 02/11/2011 1349   CO2 25 05/27/2023 1113   CO2 24 02/11/2011 1349   BUN 20 05/27/2023 1113   BUN 14 02/11/2011 1349   CREATININE 1.53 (H) 05/27/2023 1113   CREATININE 0.65 02/11/2011 1349   GLUCOSE 104 (H) 05/27/2023 1113   GLUCOSE 98 02/11/2011 1349   CALCIUM  10.4 (H) 05/27/2023 1113   CALCIUM  9.0 02/11/2011 1349   AST 37 05/27/2023 1113   ALT 18 05/27/2023 1113   ALT 36 02/11/2011 1349   ALKPHOS 159 (H) 05/27/2023 1113   ALKPHOS 122 02/11/2011 1349   BILITOT 0.7 05/27/2023 1113   PROT 7.3 05/27/2023 1113   PROT 7.0 02/11/2011 1349   ALBUMIN  3.8 05/27/2023 1113   ALBUMIN  4.1 02/11/2011 1349     Present during today's visit: patient and her daughter in law  Reviewed with patient the process of medication access for the new medication.  Patient will stop the ribociclib  and continue her exemestane  until it is time for her to switch to fulvestrant   Start plan: Patient will return to clinic prior to starting the alpelisib   Prescription dose and frequency assessed.   Future monitoring needed:  FPG or blood glucose: At least weekly for the first 2 weeks, then at least once every 4 weeks, and as clinically indicated thereafter Patient should have AM appts to allow for fasting labs A1c: baseline and every 3 months and as clinically indicated Baseline A1c ordered  Drug-drug Interactions (DDI): No current DDIs with alpelisib  Ms. Haddaway voiced understanding and appreciation. All questions answered. Medication handout provided.  Provided patient with Oral Chemotherapy Navigation Clinic phone number. Patient knows to call the office with questions or concerns. Oral Chemotherapy Navigation Clinic will continue to  follow.  Patient expressed understanding and was in agreement with this plan. She also understands that She can call clinic at any time with any questions, concerns, or complaints.   Medication Access Issues: PA pending  Follow-up plan: RTC once medication is in hand, appt pending  Thank you for allowing me to participate in the care of this patient.   Time Total: 5 mins  Visit consisted of counseling and education on dealing with issues of symptom management in the setting of serious and potentially life-threatening illness.Greater than 50%  of this time was spent counseling and coordinating care related to the above assessment and plan.  Signed by: Giselle Brutus N. Isa Hitz, PharmD, Lorraine Roses, CPP Hematology/Oncology Clinical Pharmacist Practitioner [facility] [phone]  05/28/2023 11:40 AM

## 2023-05-28 NOTE — Progress Notes (Unsigned)
 [facility]  [phone] [phone]  ID: Alicia [name] OB: 1950/03/27  MR#: [id number]  PPI#:[id number]  Patient Care Team: Theron Flavin, [name], Veryl Gottron, [name] Burnie Cartwright, RN as Registered Nurse Shellie Dials, MD as Consulting Physician (Oncology) [name], MD as Referring Physician (Radiation Oncology) Luise Saint, RN as Registered Nurse   CHIEF COMPLAINT: Recurrent stage IV ER/PR positive, 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  INTERVAL HISTORY: Patient returns to clinic today for further evaluation and treatment planning.  Pain is currently well-controlled with tramadol  and only takes narcotics sparingly.  She continues to have chronic weakness and fatigue.  She has a mild peripheral neuropathy, but no other neurologic complaints.  She denies any chest pain, shortness of breath, cough, or hemoptysis.  She denies any nausea, vomiting, constipation, or diarrhea.  She has no urinary complaints.  Patient offers no further specific complaints today.  REVIEW OF SYSTEMS:   Review of Systems  Constitutional:  Positive for malaise/fatigue. Negative for fever and weight loss.  HENT:  Negative for congestion.   Respiratory:  Negative for cough and shortness of breath.   Cardiovascular: Negative.  Negative for chest pain and leg swelling.  Gastrointestinal: Negative.  Negative for abdominal pain, constipation, diarrhea and nausea.  Genitourinary: Negative.  Negative for dysuria, flank pain and urgency.  Musculoskeletal:  Positive for back pain. Negative for joint pain.  Skin: Negative.  Negative for rash.  Neurological:  Positive for tingling, sensory change and weakness. Negative for dizziness, focal weakness and headaches.  Psychiatric/Behavioral:  The patient is not nervous/anxious.     As per HPI. Otherwise, a complete review of  systems is negative.  PAST MEDICAL HISTORY: Past Medical History:  Diagnosis Date   Acute hypoxic respiratory failure (HCC) 11/26/2022   AKI (acute kidney injury) (HCC) 07/18/2022   Anxiety    Breast cancer, left (HCC) 07/2017   Depression    Electrolyte abnormality 07/18/2022   Elevated troponin 02/17/2015   Hypercalcemia 11/26/2022   Hypertension    Hypothyroidism    Personal history of chemotherapy 2019   LEFT mastectomy-chemo before   Personal history of radiation therapy 03/2018   LEFT mastectomy   Rapid heart rate    Thyroid  disease     PAST SURGICAL HISTORY: Past Surgical History:  Procedure Laterality Date   AXILLARY LYMPH NODE BIOPSY Left 07/16/2017   METASTATIC MAMMARY CARCINOMA   BREAST BIOPSY Left 07/16/2017   us  bx of left breast mass and left breast LN.  INVASIVE MAMMARY CARCINOMA, NO SPECIAL TYPE.    BREAST EXCISIONAL BIOPSY Right 2001   benign   BREAST LUMPECTOMY WITH SENTINEL LYMPH NODE BIOPSY Left 12/06/2017   Procedure: BREAST LUMPECTOMY WITH SENTINEL LYMPH NODE BX;  Surgeon: Marshall Skeeter, MD;  Location: [facility] ORS;  Service: General;  Laterality: Left;   COLONOSCOPY     MASTECTOMY Left 12/23/2017   PORTACATH PLACEMENT Right 08/07/2017   Procedure: INSERTION PORT-A-CATH;  Surgeon: Marshall Skeeter, MD;  Location: [facility] ORS;  Service: General;  Laterality: Right;   SIMPLE MASTECTOMY WITH AXILLARY SENTINEL NODE BIOPSY Left 12/23/2017   T2,N2 with 6/7 nodes positive. Whole breast radiation.  Surgeon: Marshall Skeeter, MD;  Location: [facility] ORS;  Service: General;  Laterality: Left;   TONSILLECTOMY      FAMILY HISTORY: Family History  Problem Relation Age of Onset   Stroke Mother    Thyroid  disease Mother  Renal Disease Mother    Stroke Father    Heart attack Father    Sudden death Father 8       suicide   Anuerysm Brother    Breast cancer Neg Hx     ADVANCED DIRECTIVES (Y/N):  N  HEALTH MAINTENANCE: Social History   Tobacco Use    Smoking status: Former    Current packs/day: 0.00    Average packs/day: 1 pack/day for 30.0 years (30.0 ttl pk-yrs)    Types: Cigarettes    Start date: 5    Quit date: 2019    Years since quitting: 6.3   Smokeless tobacco: Never  Vaping Use   Vaping status: Never Used  Substance Use Topics   Alcohol use: Not Currently   Drug use: Never     Colonoscopy:  PAP:  Bone density:  Lipid panel:  Allergies  Allergen Reactions   Sulfa  Antibiotics Diarrhea    Current Outpatient Medications  Medication Sig Dispense Refill   acetaminophen  (TYLENOL ) 500 MG tablet Take 1,000 mg by mouth every 6 (six) hours as needed for moderate pain or headache.      ALPRAZolam  (XANAX ) 0.5 MG tablet Take 1 tablet (0.5 mg total) by mouth 2 (two) times daily as needed for anxiety. 60 tablet 1   ascorbic acid  (VITAMIN C ) 500 MG tablet Take 500 mg by mouth daily.     aspirin  EC 81 MG tablet Take 81 mg by mouth at bedtime.      Cholecalciferol  (VITAMIN D3) 125 MCG (5000 UT) CAPS Take 5,000 Units by mouth 2 (two) times a day.     feeding supplement (ENSURE ENLIVE / ENSURE PLUS) LIQD Take 237 mLs by mouth 2 (two) times daily between meals.     levothyroxine  (SYNTHROID ) 100 MCG tablet Take 1 tablet (100 mcg total) by mouth daily before breakfast.     linaclotide  (LINZESS ) 145 MCG CAPS capsule Take 145 mcg by mouth daily before breakfast.     loratadine  (CLARITIN ) 10 MG tablet Take 10 mg by mouth daily. Take to reduce risk of rash from everolimus .     magnesium  oxide (MAG-OX) 400 (240 Mg) MG tablet Take 400 mg by mouth daily.     midodrine  (PROAMATINE ) 10 MG tablet Take 1 tablet (10 mg total) by mouth 3 (three) times daily with meals. (Patient taking differently: Take 10 mg by mouth as needed (low bp).) 90 tablet 1   Multiple Vitamin (MULTIVITAMIN WITH MINERALS) TABS tablet Take 1 tablet by mouth daily.     ondansetron  (ZOFRAN ) 8 MG tablet Take 1 tablet (8 mg total) by mouth every 8 (eight) hours as needed. 30  tablet 1   pantoprazole  (PROTONIX ) 20 MG tablet TAKE 1 TABLET(20 MG) BY MOUTH DAILY 90 tablet 0   potassium chloride  SA (KLOR-CON  M) 20 MEQ tablet Take 2 tablets (40 mEq total) by mouth 2 (two) times daily. 180 tablet 3   Probiotic Product (ALIGN) 4 MG CAPS Take 1 capsule by mouth daily.     prochlorperazine  (COMPAZINE ) 10 MG tablet TAKE 1 TABLET(10 MG) BY MOUTH EVERY 6 HOURS AS NEEDED FOR NAUSEA OR VOMITING 30 tablet 2   rosuvastatin  (CRESTOR ) 5 MG tablet Take 1 tablet (5 mg total) by mouth daily. 90 tablet 3   traMADol  (ULTRAM ) 50 MG tablet Take 1 tablet (50 mg total) by mouth every 6 (six) hours as needed. 60 tablet 1   traZODone  (DESYREL ) 50 MG tablet Take 1 tablet (50 mg total) by mouth at bedtime.  30 tablet 2   alpelisib 200 mg daily dose (PIQRAY, 200 MG DAILY DOSE,) tablet Take 1 tablet (200 mg total) by mouth daily. Take with food daily.     HYDROcodone -acetaminophen  (NORCO/VICODIN) 5-325 MG tablet Take 1 tablet by mouth every 6 (six) hours as needed for moderate pain (pain score 4-6). (Patient not taking: Reported on 05/28/2023) 30 tablet 0   potassium chloride  SA (KLOR-CON  M) 20 MEQ tablet Take 1 tablet (20 mEq total) by mouth 2 (two) times daily. (Patient not taking: Reported on 04/30/2023) 60 tablet 3   No current facility-administered medications for this visit.    OBJECTIVE: Vitals:   05/28/23 1047  BP: 110/67  Pulse: 74  Resp: 17  Temp: 97.7 F (36.5 C)  SpO2: 95%      Body mass index is 26.79 kg/m.    ECOG FS:2 - Symptomatic, <50% confined to bed  General: Well-developed, well-nourished, no acute distress. Eyes: Pink conjunctiva, anicteric sclera. HEENT: Normocephalic, moist mucous membranes. Lungs: No audible wheezing or coughing. Heart: Regular rate and rhythm. Abdomen: Soft, nontender, no obvious distention. Musculoskeletal: Left arm in 1/2 cast from orthopedics. Neuro: Alert, answering all questions appropriately. Cranial nerves grossly intact. Skin: No rashes or  petechiae noted. Psych: Normal affect.  LAB RESULTS:  Lab Results  Component Value Date   NA 136 05/27/2023   K 4.6 05/27/2023   CL 100 05/27/2023   CO2 25 05/27/2023   GLUCOSE 104 (H) 05/27/2023   BUN 20 05/27/2023   CREATININE 1.53 (H) 05/27/2023   CALCIUM  10.4 (H) 05/27/2023   PROT 7.3 05/27/2023   ALBUMIN  3.8 05/27/2023   AST 37 05/27/2023   ALT 18 05/27/2023   ALKPHOS 159 (H) 05/27/2023   BILITOT 0.7 05/27/2023   GFRNONAA 35 (L) 05/27/2023   GFRAA 57 (L) 10/01/2019    Lab Results  Component Value Date   WBC 1.9 (L) 05/27/2023   NEUTROABS 1.2 (L) 05/27/2023   HGB 10.5 (L) 05/27/2023   HCT 31.3 (L) 05/27/2023   MCV 97.5 05/27/2023   PLT 178 05/27/2023     STUDIES: No results found.     ONCOLOGY HISTORY: Patient initially received neoadjuvant chemotherapy with Adriamycin  and Cytoxan  followed by weekly Taxol . She only received 4 cycles of weekly Taxol  prior to discontinuation of treatment on October 31, 2017 secondary to persistent peripheral neuropathy.  She ultimately required mastectomy and final pathology noted 5 of 6 lymph nodes positive for disease.  Patient completed adjuvant XRT in mid April 2020.  Pain initiated letrozole  in May 2020, this was discontinued secondary to side effects and patient was started on anastrozole .  Nuclear medicine bone scan on June 19, 2019 revealed metastatic disease and anastrozole  was subsequently discontinued.  PET scan results from October 02, 2021 revealed no clear evidence of metastatic progression.  PET scan results from June 30, 2019 reviewed independently with metastatic bony disease, but no obvious evidence of visceral disease.  MRI of the brain on August 24, 2019 reviewed independently with no obvious evidence of metastatic disease.   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  PLAN:    Recurrent stage IV ER/PR positive, HER-2 negative invasive carcinoma with bony  metastasis: See oncology history above.  Patient's CA 27-29 continues to trend up and is now 1343.4.  She has been instructed to discontinue Kisqali  and Aromasin .  Patient was previously noted to have the PIK3CA-E545k mutation on next-generation sequencing.  We had a lengthy discussion on whether  to continue treatment with Alpelisib plus fulvestrant  or consider comfort care and hospice.  Patient does not ready to enroll in hospice at this time and wishes to continue treatment.  She expressed understanding that there are no other treatment options after Alpelisib. Xgeva  has been permanently discontinued given her issues with her mandible.  Return to clinic in approximately 1 week to initiate treatment.  Patient will have consultation with palliative care at that appointment as well.  Appreciate clinical pharmacy input. Hyperglycemia: Alpelisib is known to cause elevated blood sugars and will get a hemoglobin A1c prior to initiating treatment. Fractured humerus: Pathological.  Continue XRT as planned.  Patient has been instructed to follow-up with orthopedics as well. Constipation: Patient does not complain of this today.  Continue current medications as prescribed.   Peripheral neuropathy: Chronic and unchanged. History of pulmonary embolus: Patient was diagnosed with a small pulmonary embolus on January 10, 2018.  She is no longer on anticoagulation.  There was no evidence of recurrent PE on CT scan from October 27, 2019. Osteopenia: Patient's most recent bone mineral density on March 03, 2019 reported a T score of -1.8 which is only mildly decreased from 1 year prior when the reported T score was -1.6.  Continue calcium  and vitamin D  supplementation.  Patient no longer will receive bisphosphonates.   Left hip/flank pain: MRI of the lumbar and sacral spine revealed multiple compression and insufficiency fractures along with metastatic disease.  Patient has completed XRT.  Continue tramadol  and/or Norco as  needed.   Anemia: Hemoglobin mildly improved to 10.5.  Monitor. Neutropenia: Mildly improved.  Patient's ANC is 1.2.   Renal insufficiency: GFR has trended down to 35.  Monitor.  Hypokalemia: Resolved.  Continue oral potassium supplementation. Dental pathology: Significantly improved.  Continue follow up with Dr. Tanda Falter.  Patient has discontinued hyperbaric treatments at [facility].  No further Xgeva  as above. Lymphedema: Chronic and unchanged.  Follow-up with occupational therapy as scheduled. Coping/anxiety: Continue Xanax  and trazodone  at night.  Patient has been instructed to hold trazodone  if she takes Norco for pain.  Patient was also previously given a referral to [facility].   Patient expressed understanding and was in agreement with this plan. She also understands that She can call clinic at any time with any questions, concerns, or complaints.    Cancer Staging  Breast cancer, stage 2, left (HCC) Staging form: Breast, AJCC 8th Edition - Clinical stage from 07/30/2017: Stage IIA (cT2, cN1, cM0, G2, ER+, PR+, HER2-) - Signed by Shellie Dials, MD on 07/30/2017 Histologic grading system: 3 grade system Laterality: Left   Shellie Dials, MD   05/29/2023 8:56 AM

## 2023-05-28 NOTE — Progress Notes (Signed)
 Radiation Oncology Follow up Note  Name: Alicia [name]   Date:   05/28/2023 MRN:  [id number] DOB: [birth year]    This 73 y.o. female presents to the clinic today for 1 month follow-up status post palliative radiation therapy to her pelvis as well as single fraction palliative radiation therapy for pathologic fracture of her left humerus.  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  She did develop a pathologic fracture of her left humerus which is in a cast which received a single fraction of palliative radiation therapy.  She states that the pain in that area has improved.  She went to urgent care and they declined to do any surgical intervention.  She states the rest of her areas of proved prior to treatment have achieved good palliative benefit.  She has recently seen medical oncology and planbased on the MAINTAIN trial she agreed to rechallenge with ribociclib  after previously receiving a CDK 4/6 inhibitor.   COMPLICATIONS OF TREATMENT: none  FOLLOW UP COMPLIANCE: keeps appointments   PHYSICAL EXAM:  There were no vitals taken for this visit. Wheelchair-bound female in NAD.  Well-developed well-nourished patient in NAD. HEENT reveals PERLA, EOMI, discs not visualized.  Oral cavity is clear. No oral mucosal lesions are identified. Neck is clear without evidence of cervical or supraclavicular adenopathy. Lungs are clear to A&P. Cardiac examination is essentially unremarkable with regular rate and rhythm without murmur rub or thrill. Abdomen is benign with no organomegaly or masses noted. Motor sensory and DTR levels are equal and symmetric in the upper and lower extremities. Cranial nerves II through XII are grossly intact. Proprioception is intact. No peripheral adenopathy or edema is identified. No motor or sensory levels are noted. Crude visual fields are within  normal range.  RADIOLOGY RESULTS: No current films for review  PLAN: Present time we referring her to orthopedic surgery [facility] clinic for possibility of intramedullary rod placement.  Based on her widespread metastatic disease she may not be a surgical candidate.  Otherwise I will turn follow-up care over to medical oncology and symptom management.  I be happy to reevaluate the patient anytime should further palliative treatment be indicated.  I would like to take this opportunity to thank you for allowing me to participate in the care of your patient.[name], MD

## 2023-05-28 NOTE — Telephone Encounter (Signed)
 Oral Oncology Patient Advocate Encounter  Prior Authorization for Nathanial Balboa has been approved.    PA# [id number] Effective dates: 05/28/2023 through 01/15/2024  Patients co-pay is $1,328.16.    [name], CPhT Oncology Pharmacy Patient Advocate [facility] Direct Number: [phone] Fax: [phone]

## 2023-05-28 NOTE — Telephone Encounter (Signed)
 Oral Oncology Patient Advocate Encounter   Began application for assistance for Piqray through [employer].   Application will be submitted upon completion of necessary supporting documentation.   NPAF phone number [phone].   Waiting on MD signature.   [name], CPhT Oncology Pharmacy Patient Advocate [facility] Direct Number: [phone] Fax: [phone]

## 2023-05-29 ENCOUNTER — Telehealth: Payer: Self-pay | Admitting: Pharmacy Technician

## 2023-05-29 ENCOUNTER — Other Ambulatory Visit (HOSPITAL_COMMUNITY): Payer: Self-pay

## 2023-05-29 ENCOUNTER — Other Ambulatory Visit: Payer: Self-pay

## 2023-05-29 ENCOUNTER — Other Ambulatory Visit: Payer: Self-pay | Admitting: Pharmacy Technician

## 2023-05-29 ENCOUNTER — Encounter: Payer: Self-pay | Admitting: Oncology

## 2023-05-29 MED ORDER — PIQRAY (200 MG DAILY DOSE) 200 MG PO TBPK
200.0000 mg | ORAL_TABLET | Freq: Every day | ORAL | 1 refills | Status: DC
  Filled 2023-05-29: qty 28, 28d supply, fill #0

## 2023-05-29 NOTE — Telephone Encounter (Signed)
 Oral Oncology Patient Advocate Encounter  Was successful in securing patient a $7,500 grant from [employer] to provide copayment coverage for Piqray.  This will keep the out of pocket expense at $0.     [employer] ID: [id number]   The billing information is as follows and has been shared with [facility].    RxBin: [id number] PCN: [id number] Member ID: [id number] Group ID: [id number] Dates of Eligibility: 04/29/2023 through 04/27/2024  Fund:  Breast Cancer - Medicare Access  [name], CPhT Oncology Pharmacy Patient Advocate [facility] Direct Number: [phone] Fax: [phone]

## 2023-05-29 NOTE — Telephone Encounter (Signed)
 Oral Oncology Patient Advocate [name] funding opened while in process. Patient will be utilizing grant instead.  [name], CPhT Oncology Pharmacy Patient Advocate [facility] Direct Number: [phone] Fax: [phone]

## 2023-05-29 NOTE — Progress Notes (Signed)
 Specialty Pharmacy Initial Fill Coordination Note  Alicia [name] is a 73 y.o. female contacted today regarding refills of specialty medication(s) Alpelisib (Piqray (200 MG Daily Dose)) .  Patient requested Delivery  on 06/03/23  to verified address [address] [facility] [address]   Medication will be filled on 05/16.   Patient is aware of $0 copayment. Leisure centre manager.  [name], CPhT Oncology Pharmacy Patient Advocate [facility] Direct Number: [phone] Fax: [phone]

## 2023-05-29 NOTE — Progress Notes (Signed)
 Specialty Pharmacy Initiation Note   Alicia [name] is a 73 y.o. female who will be followed by the specialty pharmacy service for RxSp Oncology    Review of administration, indication, effectiveness, safety, potential side effects, storage/disposable, and missed dose instructions occurred today for patient's specialty medication(s) Alpelisib (Piqray (200 MG Daily Dose))     Patient/Caregiver did not have any additional questions or concerns.   Patient's therapy is appropriate to: Initiate    Goals Addressed   None     Tiajuana Leppanen N Bradlee Heitman Specialty Pharmacist

## 2023-05-29 NOTE — Telephone Encounter (Signed)
 Patient successfully OnBoarded and drug education provided by pharmacist. Medication scheduled to be shipped on 05/16 for delivery on 05/19 from [facility] to patient's address. Patient also knows to call me at [phone] with any questions or concerns regarding receiving medication or if there is any unexpected change in co-pay.   [name], CPhT Oncology Pharmacy Patient Advocate [facility] Direct Number: [phone] Fax: [phone]

## 2023-05-29 NOTE — Addendum Note (Signed)
 Addended by: Thaddeus Filippo on: 05/29/2023 11:33 AM   Modules accepted: Orders

## 2023-05-30 ENCOUNTER — Other Ambulatory Visit: Payer: Self-pay | Admitting: *Deleted

## 2023-05-31 ENCOUNTER — Other Ambulatory Visit: Payer: Self-pay

## 2023-06-05 ENCOUNTER — Other Ambulatory Visit: Payer: Self-pay | Admitting: Oncology

## 2023-06-05 ENCOUNTER — Telehealth: Payer: Self-pay | Admitting: *Deleted

## 2023-06-05 ENCOUNTER — Other Ambulatory Visit: Payer: Self-pay

## 2023-06-05 ENCOUNTER — Inpatient Hospital Stay (HOSPITAL_BASED_OUTPATIENT_CLINIC_OR_DEPARTMENT_OTHER): Admitting: Hospice and Palliative Medicine

## 2023-06-05 ENCOUNTER — Inpatient Hospital Stay

## 2023-06-05 VITALS — BP 144/62 | HR 66 | Resp 22

## 2023-06-05 DIAGNOSIS — G893 Neoplasm related pain (acute) (chronic): Secondary | ICD-10-CM | POA: Diagnosis not present

## 2023-06-05 DIAGNOSIS — C7951 Secondary malignant neoplasm of bone: Secondary | ICD-10-CM

## 2023-06-05 DIAGNOSIS — C50919 Malignant neoplasm of unspecified site of unspecified female breast: Secondary | ICD-10-CM | POA: Diagnosis not present

## 2023-06-05 DIAGNOSIS — C50911 Malignant neoplasm of unspecified site of right female breast: Secondary | ICD-10-CM | POA: Diagnosis not present

## 2023-06-05 DIAGNOSIS — E0965 Drug or chemical induced diabetes mellitus with hyperglycemia: Secondary | ICD-10-CM | POA: Diagnosis not present

## 2023-06-05 DIAGNOSIS — C50912 Malignant neoplasm of unspecified site of left female breast: Secondary | ICD-10-CM

## 2023-06-05 DIAGNOSIS — R52 Pain, unspecified: Secondary | ICD-10-CM

## 2023-06-05 LAB — CMP (CANCER CENTER ONLY)
ALT: 25 U/L (ref 0–44)
AST: 36 U/L (ref 15–41)
Albumin: 3.9 g/dL (ref 3.5–5.0)
Alkaline Phosphatase: 210 U/L — ABNORMAL HIGH (ref 38–126)
Anion gap: 12 (ref 5–15)
BUN: 26 mg/dL — ABNORMAL HIGH (ref 8–23)
CO2: 22 mmol/L (ref 22–32)
Calcium: 10.3 mg/dL (ref 8.9–10.3)
Chloride: 101 mmol/L (ref 98–111)
Creatinine: 1.24 mg/dL — ABNORMAL HIGH (ref 0.44–1.00)
GFR, Estimated: 46 mL/min — ABNORMAL LOW (ref >60)
Glucose, Bld: 113 mg/dL — ABNORMAL HIGH (ref 70–99)
Potassium: 4.9 mmol/L (ref 3.5–5.1)
Sodium: 135 mmol/L (ref 135–145)
Total Bilirubin: 1 mg/dL (ref 0.0–1.2)
Total Protein: 8.1 g/dL (ref 6.5–8.1)

## 2023-06-05 LAB — CBC WITH DIFFERENTIAL/PLATELET
Abs Immature Granulocytes: 0.05 10*3/uL (ref 0.00–0.07)
Basophils Absolute: 0 10*3/uL (ref 0.0–0.1)
Basophils Relative: 1 %
Eosinophils Absolute: 0 10*3/uL (ref 0.0–0.5)
Eosinophils Relative: 1 %
HCT: 31.7 % — ABNORMAL LOW (ref 36.0–46.0)
Hemoglobin: 10.6 g/dL — ABNORMAL LOW (ref 12.0–15.0)
Immature Granulocytes: 1 %
Lymphocytes Relative: 9 %
Lymphs Abs: 0.3 10*3/uL — ABNORMAL LOW (ref 0.7–4.0)
MCH: 32.3 pg (ref 26.0–34.0)
MCHC: 33.4 g/dL (ref 30.0–36.0)
MCV: 96.6 fL (ref 80.0–100.0)
Monocytes Absolute: 0.6 10*3/uL (ref 0.1–1.0)
Monocytes Relative: 17 %
Neutro Abs: 2.5 10*3/uL (ref 1.7–7.7)
Neutrophils Relative %: 71 %
Platelets: 328 10*3/uL (ref 150–400)
RBC: 3.28 MIL/uL — ABNORMAL LOW (ref 3.87–5.11)
RDW: 17.1 % — ABNORMAL HIGH (ref 11.5–15.5)
WBC: 3.5 10*3/uL — ABNORMAL LOW (ref 4.0–10.5)
nRBC: 0 % (ref 0.0–0.2)

## 2023-06-05 LAB — HEMOGLOBIN A1C
Hgb A1c MFr Bld: 5 % (ref 4.8–5.6)
Mean Plasma Glucose: 96.8 mg/dL

## 2023-06-05 MED ORDER — OXYCODONE HCL 5 MG PO TABS: 5.0000 mg | ORAL_TABLET | ORAL | 0 refills | Status: DC | PRN

## 2023-06-05 MED ORDER — HYDROMORPHONE HCL 1 MG/ML IJ SOLN: 0.2500 mg | Freq: Once | INTRAMUSCULAR | Status: DC

## 2023-06-05 MED ORDER — SODIUM CHLORIDE 0.9 % IV SOLN
INTRAVENOUS | Status: AC
  Filled 2023-06-05: qty 250

## 2023-06-05 MED ORDER — SODIUM CHLORIDE 0.9% FLUSH
10.0000 mL | Freq: Once | INTRAVENOUS | Status: AC
  Administered 2023-06-05: 10 mL via INTRAVENOUS
  Filled 2023-06-05: qty 10

## 2023-06-05 MED ORDER — DEXAMETHASONE 2 MG PO TABS: 2.0000 mg | ORAL_TABLET | Freq: Every day | ORAL | 1 refills | Status: DC

## 2023-06-05 MED ORDER — HYDROMORPHONE HCL 1 MG/ML IJ SOLN
0.2500 mg | Freq: Once | INTRAMUSCULAR | Status: AC
  Administered 2023-06-05: 0.25 mg via INTRAVENOUS
  Filled 2023-06-05: qty 0.25

## 2023-06-05 MED ORDER — HYDROMORPHONE HCL 1 MG/ML IJ SOLN
0.5000 mg | Freq: Once | INTRAMUSCULAR | Status: AC
  Administered 2023-06-05: 0.5 mg via INTRAVENOUS

## 2023-06-05 NOTE — Addendum Note (Signed)
 Addended by: Peggyann Bower on: 06/05/2023 04:25 PM   Modules accepted: Orders

## 2023-06-05 NOTE — Progress Notes (Addendum)
 Symptom Management Clinic [facility] [phone] [phone]  Patient Care Team: Theron Flavin, [name], Veryl Gottron, [name] Burnie Cartwright, RN as Registered Nurse Adrian Alba Deadra Everts, MD as Consulting Physician (Oncology) [name], MD as Referring Physician (Radiation Oncology) Luise Saint, RN as Registered Nurse   NAME OF PATIENT: Alicia [name]  [id number]  [birth year]   DATE OF VISIT: 06/05/23  REASON FOR CONSULT: Alicia [name] is a 73 y.o. 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   INTERVAL HISTORY: Patient was here for labs with plan for next day MD visit.  She was an add-on to [facility] to evaluate for intractable pain.  Patient reports several days of intense back pain.  She does not localize a specific area but states that the pain radiates up and down her spine.  Hurts with movement.  Reports that arm pain is reasonably controlled at this point as long as she is in a brace.  Patient has been taking Norco every 6 hours.  Tolerating it well but is not found it to be particularly helpful at improving her pain.  She is also taking extra acetaminophen  in between Norco doses but no improvement.  Denies any neurologic complaints. Denies recent fevers or illnesses. Denies any easy bleeding or bruising. Reports fair appetite. Denies chest pain. Denies any nausea, vomiting, constipation, or diarrhea. Denies urinary complaints. Patient offers no further specific complaints today.   PAST MEDICAL HISTORY: Past Medical History:  Diagnosis Date   Acute hypoxic respiratory failure (HCC) 11/26/2022   AKI (acute kidney injury) (HCC) 07/18/2022   Anxiety    Breast cancer, left (HCC) 07/2017   Depression    Electrolyte abnormality 07/18/2022   Elevated troponin 02/17/2015    Hypercalcemia 11/26/2022   Hypertension    Hypothyroidism    Personal history of chemotherapy 2019   LEFT mastectomy-chemo before   Personal history of radiation therapy 03/2018   LEFT mastectomy   Rapid heart rate    Thyroid  disease     PAST SURGICAL HISTORY:  Past Surgical History:  Procedure Laterality Date   AXILLARY LYMPH NODE BIOPSY Left 07/16/2017   METASTATIC MAMMARY CARCINOMA   BREAST BIOPSY Left 07/16/2017   us  bx of left breast mass and left breast LN.  INVASIVE MAMMARY CARCINOMA, NO SPECIAL TYPE.    BREAST EXCISIONAL BIOPSY Right 2001   benign   BREAST LUMPECTOMY WITH SENTINEL LYMPH NODE BIOPSY Left 12/06/2017   Procedure: BREAST LUMPECTOMY WITH SENTINEL LYMPH NODE BX;  Surgeon: Marshall Skeeter, MD;  Location: [facility] ORS;  Service: General;  Laterality: Left;   COLONOSCOPY     MASTECTOMY Left 12/23/2017   PORTACATH PLACEMENT Right 08/07/2017   Procedure: INSERTION PORT-A-CATH;  Surgeon: Marshall Skeeter, MD;  Location: [facility] ORS;  Service: General;  Laterality: Right;   SIMPLE MASTECTOMY WITH AXILLARY SENTINEL NODE BIOPSY Left 12/23/2017   T2,N2 with 6/7 nodes positive. Whole breast radiation.  Surgeon: Marshall Skeeter, MD;  Location: [facility] ORS;  Service: General;  Laterality: Left;   TONSILLECTOMY      HEMATOLOGY/ONCOLOGY HISTORY:  Oncology History Overview Note  Patient is a 73 year old female who recently self palpated a mass on her left breast.  Subsequent imaging and biopsy revealed the above-stated breast cancer.  Case was also discussed extensively at case conference.  Given the size and the stage of  patient's malignancy, she will benefit from neoadjuvant chemotherapy using Adriamycin , Cytoxan , and Taxol .  Patient will also require Neulasta  support.  Will get CT scan of the chest, abdomen, and pelvis to assess for any metastatic disease.  Patient will also require port placement and MUGA prior to initiating treatment  CT abdomen/pelvis/chest did not reveal  any suspicious lesions concerning for metastatic disease. (08/01/17)  Port-A-Cath placed on 08/07/2017.  Cycle 1 day 1 of AC was given on 08/08/17.     Breast cancer, stage 2, left (HCC)  07/24/2017 Initial Diagnosis   Breast cancer, stage 2, left (HCC)   07/30/2017 Cancer Staging   Staging form: Breast, AJCC 8th Edition - Clinical stage from 07/30/2017: Stage IIA (cT2, cN1, cM0, G2, ER+, PR+, HER2-) - Signed by Shellie Dials, MD on 07/30/2017   08/08/2017 - 10/31/2017 Chemotherapy   The patient had DOXOrubicin  (ADRIAMYCIN ) chemo injection 130 mg, 60 mg/m2 = 130 mg, Intravenous,  Once, 4 of 4 cycles Administration: 130 mg (08/08/2017), 130 mg (08/22/2017), 130 mg (09/05/2017), 130 mg (09/19/2017) palonosetron  (ALOXI ) injection 0.25 mg, 0.25 mg, Intravenous,  Once, 4 of 4 cycles Administration: 0.25 mg (08/08/2017), 0.25 mg (08/22/2017), 0.25 mg (09/05/2017), 0.25 mg (09/19/2017) pegfilgrastim -cbqv (UDENYCA ) injection 6 mg, 6 mg, Subcutaneous, Once, 4 of 4 cycles Administration: 6 mg (08/09/2017), 6 mg (08/23/2017), 6 mg (09/06/2017), 6 mg (09/20/2017) cyclophosphamide  (CYTOXAN ) 1,300 mg in sodium chloride  0.9 % 250 mL chemo infusion, 600 mg/m2 = 1,300 mg, Intravenous,  Once, 4 of 4 cycles Administration: 1,300 mg (08/08/2017), 1,300 mg (08/22/2017), 1,300 mg (09/05/2017), 1,300 mg (09/19/2017) PACLitaxel  (TAXOL ) 174 mg in sodium chloride  0.9 % 250 mL chemo infusion (</= 80mg /m2), 80 mg/m2 = 174 mg, Intravenous,  Once, 4 of 12 cycles Dose modification: 72 mg/m2 (original dose 80 mg/m2, Cycle 6, Reason: Dose not tolerated) Administration: 174 mg (10/03/2017), 156 mg (10/17/2017), 156 mg (10/24/2017), 156 mg (10/31/2017) fosaprepitant  (EMEND ) 150 mg, dexamethasone  (DECADRON ) 12 mg in sodium chloride  0.9 % 145 mL IVPB, , Intravenous,  Once, 4 of 4 cycles Administration:  (08/08/2017),  (08/22/2017),  (09/05/2017),  (09/19/2017)  for chemotherapy treatment.      ALLERGIES:  is allergic to sulfa   antibiotics.  MEDICATIONS:  Current Outpatient Medications  Medication Sig Dispense Refill   acetaminophen  (TYLENOL ) 500 MG tablet Take 1,000 mg by mouth every 6 (six) hours as needed for moderate pain or headache.      alpelisib  200 mg daily dose (PIQRAY , 200 MG DAILY DOSE,) tablet Take 1 tablet (200 mg total) by mouth daily. Take with food daily. 28 tablet 1   ALPRAZolam  (XANAX ) 0.5 MG tablet Take 1 tablet (0.5 mg total) by mouth 2 (two) times daily as needed for anxiety. 60 tablet 1   ascorbic acid  (VITAMIN C ) 500 MG tablet Take 500 mg by mouth daily.     aspirin  EC 81 MG tablet Take 81 mg by mouth at bedtime.      Cholecalciferol  (VITAMIN D3) 125 MCG (5000 UT) CAPS Take 5,000 Units by mouth 2 (two) times a day.     feeding supplement (ENSURE ENLIVE / ENSURE PLUS) LIQD Take 237 mLs by mouth 2 (two) times daily between meals.     HYDROcodone -acetaminophen  (NORCO/VICODIN) 5-325 MG tablet Take 1 tablet by mouth every 6 (six) hours as needed for moderate pain (pain score 4-6). (Patient not taking: Reported on 05/28/2023) 30 tablet 0   levothyroxine  (SYNTHROID ) 100 MCG tablet Take 1 tablet (100 mcg total) by mouth daily before breakfast.  linaclotide  (LINZESS ) 145 MCG CAPS capsule Take 145 mcg by mouth daily before breakfast.     loratadine  (CLARITIN ) 10 MG tablet Take 10 mg by mouth daily. Take to reduce risk of rash from everolimus .     magnesium  oxide (MAG-OX) 400 (240 Mg) MG tablet Take 400 mg by mouth daily.     midodrine  (PROAMATINE ) 10 MG tablet Take 1 tablet (10 mg total) by mouth 3 (three) times daily with meals. (Patient taking differently: Take 10 mg by mouth as needed (low bp).) 90 tablet 1   Multiple Vitamin (MULTIVITAMIN WITH MINERALS) TABS tablet Take 1 tablet by mouth daily.     ondansetron  (ZOFRAN ) 8 MG tablet Take 1 tablet (8 mg total) by mouth every 8 (eight) hours as needed. 30 tablet 1   pantoprazole  (PROTONIX ) 20 MG tablet TAKE 1 TABLET(20 MG) BY MOUTH DAILY 90 tablet 0    potassium chloride  SA (KLOR-CON  M) 20 MEQ tablet Take 2 tablets (40 mEq total) by mouth 2 (two) times daily. 180 tablet 3   potassium chloride  SA (KLOR-CON  M) 20 MEQ tablet Take 1 tablet (20 mEq total) by mouth 2 (two) times daily. (Patient not taking: Reported on 04/30/2023) 60 tablet 3   Probiotic Product (ALIGN) 4 MG CAPS Take 1 capsule by mouth daily.     prochlorperazine  (COMPAZINE ) 10 MG tablet TAKE 1 TABLET(10 MG) BY MOUTH EVERY 6 HOURS AS NEEDED FOR NAUSEA OR VOMITING 30 tablet 2   rosuvastatin  (CRESTOR ) 5 MG tablet Take 1 tablet (5 mg total) by mouth daily. 90 tablet 3   traMADol  (ULTRAM ) 50 MG tablet Take 1 tablet (50 mg total) by mouth every 6 (six) hours as needed. 60 tablet 1   traZODone  (DESYREL ) 50 MG tablet Take 1 tablet (50 mg total) by mouth at bedtime. 30 tablet 2   No current facility-administered medications for this visit.    VITAL SIGNS: There were no vitals taken for this visit. There were no vitals filed for this visit.  Estimated body mass index is 26.79 kg/m as calculated from the following:   Height as of 04/02/23: 5\' 6"  (1.676 m).   Weight as of 05/28/23: 166 lb (75.3 kg).  LABS: CBC:    Component Value Date/Time   WBC 3.5 (L) 06/05/2023 1356   HGB 10.6 (L) 06/05/2023 1356   HGB 10.5 (L) 10/08/2022 0902   HGB 14.2 02/11/2011 1349   HCT 31.7 (L) 06/05/2023 1356   HCT 40.9 02/11/2011 1349   PLT 328 06/05/2023 1356   PLT 249 10/08/2022 0902   PLT 151 02/11/2011 1349   MCV 96.6 06/05/2023 1356   MCV 89 02/11/2011 1349   NEUTROABS 2.5 06/05/2023 1356   LYMPHSABS 0.3 (L) 06/05/2023 1356   MONOABS 0.6 06/05/2023 1356   EOSABS 0.0 06/05/2023 1356   BASOSABS 0.0 06/05/2023 1356   Comprehensive Metabolic Panel:    Component Value Date/Time   NA 135 06/05/2023 1357   NA 142 02/11/2011 1349   K 4.9 06/05/2023 1357   K 3.8 02/11/2011 1349   CL 101 06/05/2023 1357   CL 107 02/11/2011 1349   CO2 22 06/05/2023 1357   CO2 24 02/11/2011 1349   BUN 26 (H)  06/05/2023 1357   BUN 14 02/11/2011 1349   CREATININE 1.24 (H) 06/05/2023 1357   CREATININE 0.65 02/11/2011 1349   GLUCOSE 113 (H) 06/05/2023 1357   GLUCOSE 98 02/11/2011 1349   CALCIUM  10.3 06/05/2023 1357   CALCIUM  9.0 02/11/2011 1349   AST 36  06/05/2023 1357   ALT 25 06/05/2023 1357   ALT 36 02/11/2011 1349   ALKPHOS 210 (H) 06/05/2023 1357   ALKPHOS 122 02/11/2011 1349   BILITOT 1.0 06/05/2023 1357   PROT 8.1 06/05/2023 1357   PROT 7.0 02/11/2011 1349   ALBUMIN  3.9 06/05/2023 1357   ALBUMIN  4.1 02/11/2011 1349    RADIOGRAPHIC STUDIES: No results found.  PERFORMANCE STATUS (ECOG) : 3 - Symptomatic, >50% confined to bed  Review of Systems Unless otherwise noted, a complete review of systems is negative.  Physical Exam General: NAD, thin, frail appearing Cardiovascular: regular rate and rhythm Pulmonary: clear ant fields Abdomen: soft, nontender, + bowel sounds GU: no suprapubic tenderness Extremities: no edema, LUE in brace Skin: no rashes Neurological: Weakness but otherwise nonfocal  IMPRESSION/PLAN: Stage IV breast cancer -patient on treatment with alpelisib  plus fulvestrant .  Has been previously informed that there are no additional treatment options and hospice has been discussed but patient wished to continue treatment.  Neoplasm related pain -known pathologic fracture to left humerus.  This site has previously undergone XRT.  Patient also with known osseous metastasis throughout the spine.  She was noted on PET (03/25/2023) to have new pathologic compression fractures of T4, T12, L1, and L2.  These lesions appeared worse when compared to previous study.  I suspect further progression and/or pathologic fractures.  Will proceed with IV hydromorphone  in clinic today given severity of pain.  Will rotate from Norco to oxycodone . Will also start her on low dose dexamethasone  given probable inflammatory component of bone pain.   RTC tomorrow. Case and plan discussed with  Dr. Adrian Alba.   Addendum: Patient received total of 2 doses of IV hydromorphone  (0.25 mg and 0.5 mg) and reported significant improvement in pain.  Patient seemed to tolerate well.   Patient expressed understanding and was in agreement with this plan. She also understands that She can call clinic at any time with any questions, concerns, or complaints.   Thank you for allowing me to participate in the care of this very pleasant patient.   Time Total: 25 minutes  Visit consisted of counseling and education dealing with the complex and emotionally intense issues of symptom management in the setting of serious illness.Greater than 50%  of this time was spent counseling and coordinating care related to the above assessment and plan.  Signed by: Gerilyn Kobus, PhD, NP-C

## 2023-06-05 NOTE — Telephone Encounter (Signed)
 Alicia [name] which is on her list to talk to calling today saying that [name] is in the lot of pain.  The pain medicine is hydrocodone  and she says it is not controlling the pain.  She would like to talk to someone about the pain medicine they are coming in tomorrow but she wants to see if there is anything to do to make her pain better

## 2023-06-06 ENCOUNTER — Inpatient Hospital Stay (HOSPITAL_BASED_OUTPATIENT_CLINIC_OR_DEPARTMENT_OTHER): Admitting: Oncology

## 2023-06-06 ENCOUNTER — Encounter: Payer: Self-pay | Admitting: Oncology

## 2023-06-06 ENCOUNTER — Other Ambulatory Visit: Payer: Self-pay | Admitting: Pharmacist

## 2023-06-06 ENCOUNTER — Inpatient Hospital Stay

## 2023-06-06 ENCOUNTER — Inpatient Hospital Stay: Admitting: Pharmacist

## 2023-06-06 ENCOUNTER — Inpatient Hospital Stay (HOSPITAL_BASED_OUTPATIENT_CLINIC_OR_DEPARTMENT_OTHER): Admitting: Hospice and Palliative Medicine

## 2023-06-06 VITALS — BP 122/75 | HR 80 | Resp 16

## 2023-06-06 VITALS — BP 124/65 | HR 95 | Temp 98.5°F | Resp 16 | Ht 66.0 in

## 2023-06-06 DIAGNOSIS — C50911 Malignant neoplasm of unspecified site of right female breast: Secondary | ICD-10-CM

## 2023-06-06 DIAGNOSIS — E039 Hypothyroidism, unspecified: Secondary | ICD-10-CM | POA: Diagnosis not present

## 2023-06-06 DIAGNOSIS — Z515 Encounter for palliative care: Secondary | ICD-10-CM | POA: Diagnosis not present

## 2023-06-06 DIAGNOSIS — G893 Neoplasm related pain (acute) (chronic): Secondary | ICD-10-CM

## 2023-06-06 DIAGNOSIS — E0965 Drug or chemical induced diabetes mellitus with hyperglycemia: Secondary | ICD-10-CM | POA: Diagnosis not present

## 2023-06-06 DIAGNOSIS — C7951 Secondary malignant neoplasm of bone: Secondary | ICD-10-CM | POA: Diagnosis not present

## 2023-06-06 DIAGNOSIS — K2100 Gastro-esophageal reflux disease with esophagitis, without bleeding: Secondary | ICD-10-CM | POA: Diagnosis not present

## 2023-06-06 DIAGNOSIS — C50919 Malignant neoplasm of unspecified site of unspecified female breast: Secondary | ICD-10-CM | POA: Diagnosis not present

## 2023-06-06 DIAGNOSIS — E876 Hypokalemia: Secondary | ICD-10-CM | POA: Diagnosis not present

## 2023-06-06 DIAGNOSIS — C50912 Malignant neoplasm of unspecified site of left female breast: Secondary | ICD-10-CM | POA: Diagnosis not present

## 2023-06-06 DIAGNOSIS — R52 Pain, unspecified: Secondary | ICD-10-CM

## 2023-06-06 LAB — CANCER ANTIGEN 27.29: CA 27.29: 1331.5 U/mL — ABNORMAL HIGH (ref 0.0–38.6)

## 2023-06-06 MED ORDER — HYDROMORPHONE HCL 1 MG/ML IJ SOLN
0.5000 mg | Freq: Once | INTRAMUSCULAR | Status: AC
  Administered 2023-06-06: 0.5 mg via INTRAVENOUS
  Filled 2023-06-06: qty 1

## 2023-06-06 MED ORDER — FENTANYL 12 MCG/HR TD PT72
1.0000 | MEDICATED_PATCH | TRANSDERMAL | 0 refills | Status: DC
Start: 2023-06-06 — End: 2023-07-23

## 2023-06-06 NOTE — Progress Notes (Signed)
 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  INTERVAL HISTORY: Patient returns to clinic today for further evaluation and discussion of hospice/comfort care.  Her pain is significantly worse.  She continues to have chronic weakness and fatigue.  She has a mild peripheral neuropathy, but no other neurologic complaints.  She denies any chest pain, shortness of breath, cough, or hemoptysis.  She denies any nausea, vomiting, constipation, or diarrhea.  She has no urinary complaints.  Patient offers no further specific complaints today.  REVIEW OF SYSTEMS:   Review of Systems  Constitutional:  Positive for malaise/fatigue. Negative for fever and weight loss.  HENT:  Negative for congestion.   Respiratory:  Negative for cough and shortness of breath.   Cardiovascular: Negative.  Negative for chest pain and leg swelling.  Gastrointestinal: Negative.  Negative for abdominal pain, constipation, diarrhea and nausea.  Genitourinary: Negative.  Negative for dysuria, flank pain and urgency.  Musculoskeletal:  Positive for back pain. Negative for joint pain.  Skin: Negative.  Negative for rash.  Neurological:  Positive for tingling, sensory change and weakness. Negative for dizziness, focal weakness and headaches.  Psychiatric/Behavioral:  The patient is not nervous/anxious.     As per HPI. Otherwise, a complete review of systems is negative.  PAST MEDICAL  HISTORY: Past Medical History:  Diagnosis Date   Acute hypoxic respiratory failure (HCC) 11/26/2022   AKI (acute kidney injury) (HCC) 07/18/2022   Anxiety    Breast cancer, left (HCC) 07/2017   Depression    Electrolyte abnormality 07/18/2022   Elevated troponin 02/17/2015   Hypercalcemia 11/26/2022   Hypertension    Hypothyroidism    Personal history of chemotherapy 2019   LEFT mastectomy-chemo before   Personal history of radiation therapy 03/2018   LEFT mastectomy   Rapid heart rate    Thyroid  disease     PAST SURGICAL HISTORY: Past Surgical History:  Procedure Laterality Date   AXILLARY LYMPH NODE BIOPSY Left 07/16/2017   METASTATIC MAMMARY CARCINOMA   BREAST BIOPSY Left 07/16/2017   us  bx of left breast mass and left breast LN.  INVASIVE MAMMARY CARCINOMA, NO SPECIAL TYPE.    BREAST EXCISIONAL BIOPSY Right 2001   benign   BREAST LUMPECTOMY WITH SENTINEL LYMPH NODE BIOPSY Left 12/06/2017   Procedure: BREAST LUMPECTOMY WITH SENTINEL LYMPH NODE BX;  Surgeon: Marshall Skeeter, MD;  Location: [facility] ORS;  Service: General;  Laterality: Left;   COLONOSCOPY     MASTECTOMY Left 12/23/2017   PORTACATH PLACEMENT Right 08/07/2017   Procedure: INSERTION PORT-A-CATH;  Surgeon: Marshall Skeeter, MD;  Location: [facility] ORS;  Service: General;  Laterality: Right;   SIMPLE MASTECTOMY WITH AXILLARY SENTINEL NODE BIOPSY Left 12/23/2017   T2,N2 with 6/7 nodes positive. Whole breast radiation.  Surgeon: Marshall Skeeter, MD;  Location: [facility] ORS;  Service: General;  Laterality: Left;   TONSILLECTOMY      FAMILY HISTORY: Family History  Problem Relation Age of Onset   Stroke Mother    Thyroid  disease Mother    Renal  Disease Mother    Stroke Father    Heart attack Father    Sudden death Father 48       suicide   Anuerysm Brother    Breast cancer Neg Hx     ADVANCED DIRECTIVES (Y/N):  N  HEALTH MAINTENANCE: Social History   Tobacco Use   Smoking status: Former    Current  packs/day: 0.00    Average packs/day: 1 pack/day for 30.0 years (30.0 ttl pk-yrs)    Types: Cigarettes    Start date: 34    Quit date: 2019    Years since quitting: 6.3   Smokeless tobacco: Never  Vaping Use   Vaping status: Never Used  Substance Use Topics   Alcohol use: Not Currently   Drug use: Never     Colonoscopy:  PAP:  Bone density:  Lipid panel:  Allergies  Allergen Reactions   Sulfa  Antibiotics Diarrhea    Current Outpatient Medications  Medication Sig Dispense Refill   acetaminophen  (TYLENOL ) 500 MG tablet Take 1,000 mg by mouth every 6 (six) hours as needed for moderate pain or headache.      ALPRAZolam  (XANAX ) 0.5 MG tablet Take 1 tablet (0.5 mg total) by mouth 2 (two) times daily as needed for anxiety. 60 tablet 1   ascorbic acid  (VITAMIN C ) 500 MG tablet Take 500 mg by mouth daily.     aspirin  EC 81 MG tablet Take 81 mg by mouth at bedtime.      Cholecalciferol  (VITAMIN D3) 125 MCG (5000 UT) CAPS Take 5,000 Units by mouth 2 (two) times a day.     dexamethasone  (DECADRON ) 2 MG tablet Take 1 tablet (2 mg total) by mouth daily. 15 tablet 1   feeding supplement (ENSURE ENLIVE / ENSURE PLUS) LIQD Take 237 mLs by mouth 2 (two) times daily between meals.     levothyroxine  (SYNTHROID ) 100 MCG tablet Take 1 tablet (100 mcg total) by mouth daily before breakfast.     linaclotide  (LINZESS ) 145 MCG CAPS capsule Take 145 mcg by mouth daily before breakfast.     loratadine  (CLARITIN ) 10 MG tablet Take 10 mg by mouth daily. Take to reduce risk of rash from everolimus .     magnesium  oxide (MAG-OX) 400 (240 Mg) MG tablet Take 400 mg by mouth daily.     Multiple Vitamin (MULTIVITAMIN WITH MINERALS) TABS tablet Take 1 tablet by mouth daily.     ondansetron  (ZOFRAN ) 8 MG tablet Take 1 tablet (8 mg total) by mouth every 8 (eight) hours as needed. 30 tablet 1   oxyCODONE  (OXY IR/ROXICODONE ) 5 MG immediate release tablet Take 1-2 tablets (5-10 mg total) by mouth every 4 (four) hours  as needed for severe pain (pain score 7-10). 30 tablet 0   pantoprazole  (PROTONIX ) 20 MG tablet TAKE 1 TABLET(20 MG) BY MOUTH DAILY 90 tablet 0   potassium chloride  SA (KLOR-CON  M) 20 MEQ tablet Take 2 tablets (40 mEq total) by mouth 2 (two) times daily. 180 tablet 3   Probiotic Product (ALIGN) 4 MG CAPS Take 1 capsule by mouth daily.     prochlorperazine  (COMPAZINE ) 10 MG tablet TAKE 1 TABLET(10 MG) BY MOUTH EVERY 6 HOURS AS NEEDED FOR NAUSEA OR VOMITING 30 tablet 2   rosuvastatin  (CRESTOR ) 5 MG tablet Take 1 tablet (5 mg total) by mouth daily. 90 tablet 3   traZODone  (DESYREL ) 50 MG tablet Take 1 tablet (50 mg total) by mouth at bedtime. 30 tablet 2   alpelisib  200 mg daily  dose (PIQRAY , 200 MG DAILY DOSE,) tablet Take 1 tablet (200 mg total) by mouth daily. Take with food daily. (Patient not taking: Reported on 06/06/2023) 28 tablet 1   fentaNYL  (DURAGESIC ) 12 MCG/HR Place 1 patch onto the skin every 3 (three) days. 10 patch 0   midodrine  (PROAMATINE ) 10 MG tablet Take 1 tablet (10 mg total) by mouth 3 (three) times daily with meals. (Patient not taking: Reported on 06/06/2023) 90 tablet 1   No current facility-administered medications for this visit.   Facility-Administered Medications Ordered in Other Visits  Medication Dose Route Frequency Provider Last Rate Last Admin   0.9 %  sodium chloride  infusion   Intravenous Continuous Borders, Carlene Che, NP   Stopped at 06/05/23 1620    OBJECTIVE: Vitals:   06/06/23 0922  BP: 124/65  Pulse: 95  Resp: 16  Temp: 98.5 F (36.9 C)  SpO2: 95%      Body mass index is 26.79 kg/m.    ECOG FS:3 - Symptomatic, >50% confined to bed  General: Well-developed, well-nourished, no acute distress.  Sitting in a wheelchair. Eyes: Pink conjunctiva, anicteric sclera. HEENT: Normocephalic, moist mucous membranes. Lungs: No audible wheezing or coughing. Heart: Regular rate and rhythm. Abdomen: Soft, nontender, no obvious distention. Musculoskeletal: No  edema, cyanosis, or clubbing. Neuro: Alert, answering all questions appropriately. Cranial nerves grossly intact. Skin: No rashes or petechiae noted. Psych: Normal affect.  LAB RESULTS:  Lab Results  Component Value Date   NA 135 06/05/2023   K 4.9 06/05/2023   CL 101 06/05/2023   CO2 22 06/05/2023   GLUCOSE 113 (H) 06/05/2023   BUN 26 (H) 06/05/2023   CREATININE 1.24 (H) 06/05/2023   CALCIUM  10.3 06/05/2023   PROT 8.1 06/05/2023   ALBUMIN  3.9 06/05/2023   AST 36 06/05/2023   ALT 25 06/05/2023   ALKPHOS 210 (H) 06/05/2023   BILITOT 1.0 06/05/2023   GFRNONAA 46 (L) 06/05/2023   GFRAA 57 (L) 10/01/2019    Lab Results  Component Value Date   WBC 3.5 (L) 06/05/2023   NEUTROABS 2.5 06/05/2023   HGB 10.6 (L) 06/05/2023   HCT 31.7 (L) 06/05/2023   MCV 96.6 06/05/2023   PLT 328 06/05/2023     STUDIES: No results found.     ONCOLOGY HISTORY: Patient initially received neoadjuvant chemotherapy with Adriamycin  and Cytoxan  followed by weekly Taxol . She only received 4 cycles of weekly Taxol  prior to discontinuation of treatment on October 31, 2017 secondary to persistent peripheral neuropathy.  She ultimately required mastectomy and final pathology noted 5 of 6 lymph nodes positive for disease.  Patient completed adjuvant XRT in mid April 2020.  Pain initiated letrozole  in May 2020, this was discontinued secondary to side effects and patient was started on anastrozole .  Nuclear medicine bone scan on June 19, 2019 revealed metastatic disease and anastrozole  was subsequently discontinued.  PET scan results from October 02, 2021 revealed no clear evidence of metastatic progression.  PET scan results from June 30, 2019 reviewed independently with metastatic bony disease, but no obvious evidence of visceral disease.  MRI of the brain on August 24, 2019 reviewed independently with no obvious evidence of metastatic disease.   ASSESSMENT: Recurrent stage IV ER/PR positive, HER-2 negative  invasive carcinoma with bony metastasis.  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  Over the past week patient's pain has increased and she has elected to pursue comfort measures only.  She never initiated Alpelisib .  No further treatments are planned.  A referral has been sent to hospice.  No follow-up has been scheduled. Pain: Continue current narcotic regimen as per palliative care.  Hospice as above. Anemia: Patient's most recent hemoglobin was 10.6.  No further labs are necessary. Neutropenia: Resolved. Renal insufficiency: Patient's GFR have improved to 46.  No further labs necessary.    I spent a total of 30 minutes reviewing chart data, face-to-face evaluation with the patient, counseling and coordination of care as detailed above.   Patient expressed understanding and was in agreement with this plan. She also understands that She can call clinic at any time with any questions, concerns, or complaints.    Cancer Staging  Breast cancer, stage 2, left (HCC) Staging form: Breast, AJCC 8th Edition - Clinical stage from 07/30/2017: Stage IIA (cT2, cN1, cM0, G2, ER+, PR+, HER2-) - Signed by Shellie Dials, MD on 07/30/2017 Histologic grading system: 3 grade system Laterality: Left   Shellie Dials, MD   06/06/2023 1:14 PM

## 2023-06-06 NOTE — Progress Notes (Signed)
 Patient transitioning to comfort care. Patient not seen by CPP.

## 2023-06-06 NOTE — Progress Notes (Signed)
 Patient transitioning to comfort care. Disenrolled from specialty pharmacy.

## 2023-06-06 NOTE — Progress Notes (Signed)
 Palliative Medicine [facility] at [facility] Telephone:(336) [phone] Fax:(336) [phone]   Name: Alicia [name] Date: 06/06/2023 MRN: [id number]  DOB: November 17, 1950  Patient Care Team: Theron Flavin, [name], Veryl Gottron, [name] Burnie Cartwright, RN as Registered Nurse Shellie Dials, MD as Consulting Physician (Oncology) [name], MD as Referring Physician (Radiation Oncology) Luise Saint, RN as Registered Nurse    REASON FOR CONSULTATION: Alicia [name] is a 73 y.o. 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  Patient with overall poor tolerance to cancer treatment and with progressive bony metastasis demonstrated on CT. Palliative care was consulted to address goals  SOCIAL HISTORY:     reports that she quit smoking about 6 years ago. Her smoking use included cigarettes. She started smoking about 36 years ago. She has a 30 pack-year smoking history. She has never used smokeless tobacco. She reports that she does not currently use alcohol. She reports that she does not use drugs.  Patient is unmarried.  She has a son with whom she lives.  ADVANCE DIRECTIVES:  Does not have  CODE STATUS: DNR/DNI (DNR order signed on 06/06/23)  PAST MEDICAL HISTORY: Past Medical History:  Diagnosis Date   Acute hypoxic respiratory failure (HCC) 11/26/2022   AKI (acute kidney injury) (HCC) 07/18/2022   Anxiety    Breast cancer, left (HCC) 07/2017   Depression    Electrolyte abnormality 07/18/2022   Elevated troponin 02/17/2015   Hypercalcemia 11/26/2022   Hypertension    Hypothyroidism    Personal history of chemotherapy 2019   LEFT mastectomy-chemo before   Personal history of radiation therapy 03/2018   LEFT mastectomy   Rapid heart rate    Thyroid  disease     PAST SURGICAL HISTORY:   Past Surgical History:  Procedure Laterality Date   AXILLARY LYMPH NODE BIOPSY Left 07/16/2017   METASTATIC MAMMARY CARCINOMA   BREAST BIOPSY Left 07/16/2017   us  bx of left breast mass and left breast LN.  INVASIVE MAMMARY CARCINOMA, NO SPECIAL TYPE.    BREAST EXCISIONAL BIOPSY Right 2001   benign   BREAST LUMPECTOMY WITH SENTINEL LYMPH NODE BIOPSY Left 12/06/2017   Procedure: BREAST LUMPECTOMY WITH SENTINEL LYMPH NODE BX;  Surgeon: Marshall Skeeter, MD;  Location: [facility] ORS;  Service: General;  Laterality: Left;   COLONOSCOPY     MASTECTOMY Left 12/23/2017   PORTACATH PLACEMENT Right 08/07/2017   Procedure: INSERTION PORT-A-CATH;  Surgeon: Marshall Skeeter, MD;  Location: [facility] ORS;  Service: General;  Laterality: Right;   SIMPLE MASTECTOMY WITH AXILLARY SENTINEL NODE BIOPSY Left 12/23/2017   T2,N2 with 6/7 nodes positive. Whole breast radiation.  Surgeon: Marshall Skeeter, MD;  Location: [facility] ORS;  Service: General;  Laterality: Left;   TONSILLECTOMY      HEMATOLOGY/ONCOLOGY HISTORY:  Oncology History Overview Note  Patient is a 73 year old female who recently self palpated a mass on her left breast.  Subsequent imaging and biopsy revealed the above-stated breast cancer.  Case was also discussed extensively at case conference.  Given the size and the stage of patient's malignancy, she will benefit from neoadjuvant chemotherapy using Adriamycin , Cytoxan , and Taxol .  Patient will also require Neulasta  support.  Will get CT scan of the chest, abdomen, and pelvis to assess for any metastatic disease.  Patient will also require port placement and MUGA prior to  initiating treatment  CT abdomen/pelvis/chest did not reveal any suspicious lesions concerning for metastatic disease. (08/01/17)  Port-A-Cath placed on 08/07/2017.  Cycle 1 day 1 of AC was given on 08/08/17.     Breast cancer, stage 2, left (HCC)  07/24/2017 Initial Diagnosis   Breast cancer, stage 2, left (HCC)   07/30/2017  Cancer Staging   Staging form: Breast, AJCC 8th Edition - Clinical stage from 07/30/2017: Stage IIA (cT2, cN1, cM0, G2, ER+, PR+, HER2-) - Signed by Shellie Dials, MD on 07/30/2017   08/08/2017 - 10/31/2017 Chemotherapy   The patient had DOXOrubicin  (ADRIAMYCIN ) chemo injection 130 mg, 60 mg/m2 = 130 mg, Intravenous,  Once, 4 of 4 cycles Administration: 130 mg (08/08/2017), 130 mg (08/22/2017), 130 mg (09/05/2017), 130 mg (09/19/2017) palonosetron  (ALOXI ) injection 0.25 mg, 0.25 mg, Intravenous,  Once, 4 of 4 cycles Administration: 0.25 mg (08/08/2017), 0.25 mg (08/22/2017), 0.25 mg (09/05/2017), 0.25 mg (09/19/2017) pegfilgrastim -cbqv (UDENYCA ) injection 6 mg, 6 mg, Subcutaneous, Once, 4 of 4 cycles Administration: 6 mg (08/09/2017), 6 mg (08/23/2017), 6 mg (09/06/2017), 6 mg (09/20/2017) cyclophosphamide  (CYTOXAN ) 1,300 mg in sodium chloride  0.9 % 250 mL chemo infusion, 600 mg/m2 = 1,300 mg, Intravenous,  Once, 4 of 4 cycles Administration: 1,300 mg (08/08/2017), 1,300 mg (08/22/2017), 1,300 mg (09/05/2017), 1,300 mg (09/19/2017) PACLitaxel  (TAXOL ) 174 mg in sodium chloride  0.9 % 250 mL chemo infusion (</= 80mg /m2), 80 mg/m2 = 174 mg, Intravenous,  Once, 4 of 12 cycles Dose modification: 72 mg/m2 (original dose 80 mg/m2, Cycle 6, Reason: Dose not tolerated) Administration: 174 mg (10/03/2017), 156 mg (10/17/2017), 156 mg (10/24/2017), 156 mg (10/31/2017) fosaprepitant  (EMEND ) 150 mg, dexamethasone  (DECADRON ) 12 mg in sodium chloride  0.9 % 145 mL IVPB, , Intravenous,  Once, 4 of 4 cycles Administration:  (08/08/2017),  (08/22/2017),  (09/05/2017),  (09/19/2017)  for chemotherapy treatment.      ALLERGIES:  is allergic to sulfa  antibiotics.  MEDICATIONS:  Current Outpatient Medications  Medication Sig Dispense Refill   fentaNYL  (DURAGESIC ) 12 MCG/HR Place 1 patch onto the skin every 3 (three) days. 10 patch 0   acetaminophen  (TYLENOL ) 500 MG tablet Take 1,000 mg by mouth every 6 (six) hours as needed for moderate  pain or headache.      alpelisib  200 mg daily dose (PIQRAY , 200 MG DAILY DOSE,) tablet Take 1 tablet (200 mg total) by mouth daily. Take with food daily. (Patient not taking: Reported on 06/06/2023) 28 tablet 1   ALPRAZolam  (XANAX ) 0.5 MG tablet Take 1 tablet (0.5 mg total) by mouth 2 (two) times daily as needed for anxiety. 60 tablet 1   ascorbic acid  (VITAMIN C ) 500 MG tablet Take 500 mg by mouth daily.     aspirin  EC 81 MG tablet Take 81 mg by mouth at bedtime.      Cholecalciferol  (VITAMIN D3) 125 MCG (5000 UT) CAPS Take 5,000 Units by mouth 2 (two) times a day.     dexamethasone  (DECADRON ) 2 MG tablet Take 1 tablet (2 mg total) by mouth daily. 15 tablet 1   feeding supplement (ENSURE ENLIVE / ENSURE PLUS) LIQD Take 237 mLs by mouth 2 (two) times daily between meals.     levothyroxine  (SYNTHROID ) 100 MCG tablet Take 1 tablet (100 mcg total) by mouth daily before breakfast.     linaclotide  (LINZESS ) 145 MCG CAPS capsule Take 145 mcg by mouth daily before breakfast.     loratadine  (CLARITIN ) 10 MG tablet Take 10 mg by mouth daily. Take to reduce risk of rash  from everolimus .     magnesium  oxide (MAG-OX) 400 (240 Mg) MG tablet Take 400 mg by mouth daily.     midodrine  (PROAMATINE ) 10 MG tablet Take 1 tablet (10 mg total) by mouth 3 (three) times daily with meals. (Patient not taking: Reported on 06/06/2023) 90 tablet 1   Multiple Vitamin (MULTIVITAMIN WITH MINERALS) TABS tablet Take 1 tablet by mouth daily.     ondansetron  (ZOFRAN ) 8 MG tablet Take 1 tablet (8 mg total) by mouth every 8 (eight) hours as needed. 30 tablet 1   oxyCODONE  (OXY IR/ROXICODONE ) 5 MG immediate release tablet Take 1-2 tablets (5-10 mg total) by mouth every 4 (four) hours as needed for severe pain (pain score 7-10). 30 tablet 0   pantoprazole  (PROTONIX ) 20 MG tablet TAKE 1 TABLET(20 MG) BY MOUTH DAILY 90 tablet 0   potassium chloride  SA (KLOR-CON  M) 20 MEQ tablet Take 2 tablets (40 mEq total) by mouth 2 (two) times daily. 180  tablet 3   Probiotic Product (ALIGN) 4 MG CAPS Take 1 capsule by mouth daily.     prochlorperazine  (COMPAZINE ) 10 MG tablet TAKE 1 TABLET(10 MG) BY MOUTH EVERY 6 HOURS AS NEEDED FOR NAUSEA OR VOMITING 30 tablet 2   rosuvastatin  (CRESTOR ) 5 MG tablet Take 1 tablet (5 mg total) by mouth daily. 90 tablet 3   traZODone  (DESYREL ) 50 MG tablet Take 1 tablet (50 mg total) by mouth at bedtime. 30 tablet 2   No current facility-administered medications for this visit.   Facility-Administered Medications Ordered in Other Visits  Medication Dose Route Frequency Provider Last Rate Last Admin   0.9 %  sodium chloride  infusion   Intravenous Continuous Aarthi Uyeno, Carlene Che, NP   Stopped at 06/05/23 1620    VITAL SIGNS: There were no vitals taken for this visit. There were no vitals filed for this visit.   Estimated body mass index is 26.79 kg/m as calculated from the following:   Height as of an earlier encounter on 06/06/23: 5\' 6"  (1.676 m).   Weight as of 05/28/23: 166 lb (75.3 kg).  LABS: CBC:    Component Value Date/Time   WBC 3.5 (L) 06/05/2023 1356   HGB 10.6 (L) 06/05/2023 1356   HGB 10.5 (L) 10/08/2022 0902   HGB 14.2 02/11/2011 1349   HCT 31.7 (L) 06/05/2023 1356   HCT 40.9 02/11/2011 1349   PLT 328 06/05/2023 1356   PLT 249 10/08/2022 0902   PLT 151 02/11/2011 1349   MCV 96.6 06/05/2023 1356   MCV 89 02/11/2011 1349   NEUTROABS 2.5 06/05/2023 1356   LYMPHSABS 0.3 (L) 06/05/2023 1356   MONOABS 0.6 06/05/2023 1356   EOSABS 0.0 06/05/2023 1356   BASOSABS 0.0 06/05/2023 1356   Comprehensive Metabolic Panel:    Component Value Date/Time   NA 135 06/05/2023 1357   NA 142 02/11/2011 1349   K 4.9 06/05/2023 1357   K 3.8 02/11/2011 1349   CL 101 06/05/2023 1357   CL 107 02/11/2011 1349   CO2 22 06/05/2023 1357   CO2 24 02/11/2011 1349   BUN 26 (H) 06/05/2023 1357   BUN 14 02/11/2011 1349   CREATININE 1.24 (H) 06/05/2023 1357   CREATININE 0.65 02/11/2011 1349   GLUCOSE 113 (H)  06/05/2023 1357   GLUCOSE 98 02/11/2011 1349   CALCIUM  10.3 06/05/2023 1357   CALCIUM  9.0 02/11/2011 1349   AST 36 06/05/2023 1357   ALT 25 06/05/2023 1357   ALT 36 02/11/2011 1349   ALKPHOS 210 (  H) 06/05/2023 1357   ALKPHOS 122 02/11/2011 1349   BILITOT 1.0 06/05/2023 1357   PROT 8.1 06/05/2023 1357   PROT 7.0 02/11/2011 1349   ALBUMIN  3.9 06/05/2023 1357   ALBUMIN  4.1 02/11/2011 1349    RADIOGRAPHIC STUDIES: No results found.  PERFORMANCE STATUS (ECOG) : 2 - Symptomatic, <50% confined to bed  Review of Systems Unless otherwise noted, a complete review of systems is negative.  Physical Exam General: NAD Pulmonary: Unlabored Extremities: no edema, no joint deformities Skin: no rashes Neurological: Weakness but otherwise nonfocal  IMPRESSION: Follow-up visit.  Patient accompanied by daughter.  Patient continues to endorse persistent pain.  States that pain is slightly improved from yesterday.  However, daughter has been giving oxycodone  every 2-3 hours overnight.  Tolerating well without adverse effects.  Discussed options for adjustment of her pain regimen.  Will start patient on low-dose transdermal fentanyl  as a long-acting opioid.  May continue oxycodone  as needed for breakthrough pain but hopefully this will lessen utilization.  Daughter has also not yet given the patient dexamethasone .  Hopefully that will help as well.  Will plan to repeat dose of IV hydromorphone  in clinic as pain is currently 7-8 out of 10.  Discussed goals.  Patient states that she does not feel that she can tolerate further chemotherapy.  She would like to focus on comfort care at home.  She is in agreement with hospice involvement.  Discussed CODE STATUS.  Patient stated that she would not want to be resuscitated nor have her life prolonged artificial machines.  She would prefer a natural death.  DNR order signed for patient to take home.   PLAN: -Best supportive care -Continue oxycodone  every  3-4 hours as needed for breakthrough pain -Start transdermal fentanyl  12.5 mcg every 72 hours -Daily bowel regimen - DNR/DNI -Follow-up as needed  Case and plan discussed with Dr. Adrian Alba  Patient expressed understanding and was in agreement with this plan. She also understands that She can call the clinic at any time with any questions, concerns, or complaints.     Time Total: 20 minutes  Visit consisted of counseling and education dealing with the complex and emotionally intense issues of symptom management and palliative care in the setting of serious and potentially life-threatening illness.Greater than 50%  of this time was spent counseling and coordinating care related to the above assessment and plan.  Signed by: Gerilyn Kobus, PhD, NP-C

## 2023-06-06 NOTE — Progress Notes (Signed)
 Patient enrolling in Hospice Care, no Faslodex  given today.

## 2023-06-06 NOTE — Progress Notes (Signed)
 Pt here to discuss comfort care instead of Picray treatment.

## 2023-06-07 DIAGNOSIS — C7951 Secondary malignant neoplasm of bone: Secondary | ICD-10-CM | POA: Diagnosis not present

## 2023-06-07 DIAGNOSIS — E039 Hypothyroidism, unspecified: Secondary | ICD-10-CM | POA: Diagnosis not present

## 2023-06-07 DIAGNOSIS — K2100 Gastro-esophageal reflux disease with esophagitis, without bleeding: Secondary | ICD-10-CM | POA: Diagnosis not present

## 2023-06-07 DIAGNOSIS — C50912 Malignant neoplasm of unspecified site of left female breast: Secondary | ICD-10-CM | POA: Diagnosis not present

## 2023-06-07 DIAGNOSIS — E876 Hypokalemia: Secondary | ICD-10-CM | POA: Diagnosis not present

## 2023-06-12 ENCOUNTER — Ambulatory Visit: Admitting: Cardiology

## 2023-06-13 DIAGNOSIS — E876 Hypokalemia: Secondary | ICD-10-CM | POA: Diagnosis not present

## 2023-06-13 DIAGNOSIS — K2100 Gastro-esophageal reflux disease with esophagitis, without bleeding: Secondary | ICD-10-CM | POA: Diagnosis not present

## 2023-06-13 DIAGNOSIS — C7951 Secondary malignant neoplasm of bone: Secondary | ICD-10-CM | POA: Diagnosis not present

## 2023-06-13 DIAGNOSIS — C50912 Malignant neoplasm of unspecified site of left female breast: Secondary | ICD-10-CM | POA: Diagnosis not present

## 2023-06-13 DIAGNOSIS — E039 Hypothyroidism, unspecified: Secondary | ICD-10-CM | POA: Diagnosis not present

## 2023-06-14 DIAGNOSIS — E876 Hypokalemia: Secondary | ICD-10-CM | POA: Diagnosis not present

## 2023-06-14 DIAGNOSIS — K2100 Gastro-esophageal reflux disease with esophagitis, without bleeding: Secondary | ICD-10-CM | POA: Diagnosis not present

## 2023-06-14 DIAGNOSIS — C7951 Secondary malignant neoplasm of bone: Secondary | ICD-10-CM | POA: Diagnosis not present

## 2023-06-14 DIAGNOSIS — E039 Hypothyroidism, unspecified: Secondary | ICD-10-CM | POA: Diagnosis not present

## 2023-06-14 DIAGNOSIS — C50912 Malignant neoplasm of unspecified site of left female breast: Secondary | ICD-10-CM | POA: Diagnosis not present

## 2023-06-16 DIAGNOSIS — E876 Hypokalemia: Secondary | ICD-10-CM | POA: Diagnosis not present

## 2023-06-16 DIAGNOSIS — E039 Hypothyroidism, unspecified: Secondary | ICD-10-CM | POA: Diagnosis not present

## 2023-06-16 DIAGNOSIS — K2100 Gastro-esophageal reflux disease with esophagitis, without bleeding: Secondary | ICD-10-CM | POA: Diagnosis not present

## 2023-06-16 DIAGNOSIS — C50912 Malignant neoplasm of unspecified site of left female breast: Secondary | ICD-10-CM | POA: Diagnosis not present

## 2023-06-16 DIAGNOSIS — C7951 Secondary malignant neoplasm of bone: Secondary | ICD-10-CM | POA: Diagnosis not present

## 2023-06-18 DIAGNOSIS — K2100 Gastro-esophageal reflux disease with esophagitis, without bleeding: Secondary | ICD-10-CM | POA: Diagnosis not present

## 2023-06-18 DIAGNOSIS — C50912 Malignant neoplasm of unspecified site of left female breast: Secondary | ICD-10-CM | POA: Diagnosis not present

## 2023-06-18 DIAGNOSIS — E039 Hypothyroidism, unspecified: Secondary | ICD-10-CM | POA: Diagnosis not present

## 2023-06-18 DIAGNOSIS — C7951 Secondary malignant neoplasm of bone: Secondary | ICD-10-CM | POA: Diagnosis not present

## 2023-06-18 DIAGNOSIS — E876 Hypokalemia: Secondary | ICD-10-CM | POA: Diagnosis not present

## 2023-06-21 DIAGNOSIS — C50912 Malignant neoplasm of unspecified site of left female breast: Secondary | ICD-10-CM | POA: Diagnosis not present

## 2023-06-21 DIAGNOSIS — C7951 Secondary malignant neoplasm of bone: Secondary | ICD-10-CM | POA: Diagnosis not present

## 2023-06-21 DIAGNOSIS — K2100 Gastro-esophageal reflux disease with esophagitis, without bleeding: Secondary | ICD-10-CM | POA: Diagnosis not present

## 2023-06-21 DIAGNOSIS — E876 Hypokalemia: Secondary | ICD-10-CM | POA: Diagnosis not present

## 2023-06-21 DIAGNOSIS — E039 Hypothyroidism, unspecified: Secondary | ICD-10-CM | POA: Diagnosis not present

## 2023-06-22 DIAGNOSIS — E039 Hypothyroidism, unspecified: Secondary | ICD-10-CM | POA: Diagnosis not present

## 2023-06-22 DIAGNOSIS — C50912 Malignant neoplasm of unspecified site of left female breast: Secondary | ICD-10-CM | POA: Diagnosis not present

## 2023-06-22 DIAGNOSIS — E876 Hypokalemia: Secondary | ICD-10-CM | POA: Diagnosis not present

## 2023-06-22 DIAGNOSIS — Z4789 Encounter for other orthopedic aftercare: Secondary | ICD-10-CM | POA: Diagnosis not present

## 2023-06-22 DIAGNOSIS — C7951 Secondary malignant neoplasm of bone: Secondary | ICD-10-CM | POA: Diagnosis not present

## 2023-06-22 DIAGNOSIS — M84422A Pathological fracture, left humerus, initial encounter for fracture: Secondary | ICD-10-CM | POA: Diagnosis not present

## 2023-06-22 DIAGNOSIS — K2100 Gastro-esophageal reflux disease with esophagitis, without bleeding: Secondary | ICD-10-CM | POA: Diagnosis not present

## 2023-06-24 DIAGNOSIS — E876 Hypokalemia: Secondary | ICD-10-CM | POA: Diagnosis not present

## 2023-06-24 DIAGNOSIS — C7951 Secondary malignant neoplasm of bone: Secondary | ICD-10-CM | POA: Diagnosis not present

## 2023-06-24 DIAGNOSIS — K2100 Gastro-esophageal reflux disease with esophagitis, without bleeding: Secondary | ICD-10-CM | POA: Diagnosis not present

## 2023-06-24 DIAGNOSIS — E039 Hypothyroidism, unspecified: Secondary | ICD-10-CM | POA: Diagnosis not present

## 2023-06-24 DIAGNOSIS — C50912 Malignant neoplasm of unspecified site of left female breast: Secondary | ICD-10-CM | POA: Diagnosis not present

## 2023-06-25 DIAGNOSIS — E039 Hypothyroidism, unspecified: Secondary | ICD-10-CM | POA: Diagnosis not present

## 2023-06-25 DIAGNOSIS — C50912 Malignant neoplasm of unspecified site of left female breast: Secondary | ICD-10-CM | POA: Diagnosis not present

## 2023-06-25 DIAGNOSIS — E876 Hypokalemia: Secondary | ICD-10-CM | POA: Diagnosis not present

## 2023-06-25 DIAGNOSIS — C7951 Secondary malignant neoplasm of bone: Secondary | ICD-10-CM | POA: Diagnosis not present

## 2023-06-25 DIAGNOSIS — K2100 Gastro-esophageal reflux disease with esophagitis, without bleeding: Secondary | ICD-10-CM | POA: Diagnosis not present

## 2023-06-28 DIAGNOSIS — C7951 Secondary malignant neoplasm of bone: Secondary | ICD-10-CM | POA: Diagnosis not present

## 2023-06-28 DIAGNOSIS — K2100 Gastro-esophageal reflux disease with esophagitis, without bleeding: Secondary | ICD-10-CM | POA: Diagnosis not present

## 2023-06-28 DIAGNOSIS — C50912 Malignant neoplasm of unspecified site of left female breast: Secondary | ICD-10-CM | POA: Diagnosis not present

## 2023-06-28 DIAGNOSIS — E876 Hypokalemia: Secondary | ICD-10-CM | POA: Diagnosis not present

## 2023-06-28 DIAGNOSIS — E039 Hypothyroidism, unspecified: Secondary | ICD-10-CM | POA: Diagnosis not present

## 2023-07-02 DIAGNOSIS — C7951 Secondary malignant neoplasm of bone: Secondary | ICD-10-CM | POA: Diagnosis not present

## 2023-07-02 DIAGNOSIS — E039 Hypothyroidism, unspecified: Secondary | ICD-10-CM | POA: Diagnosis not present

## 2023-07-02 DIAGNOSIS — C50912 Malignant neoplasm of unspecified site of left female breast: Secondary | ICD-10-CM | POA: Diagnosis not present

## 2023-07-02 DIAGNOSIS — E876 Hypokalemia: Secondary | ICD-10-CM | POA: Diagnosis not present

## 2023-07-02 DIAGNOSIS — K2100 Gastro-esophageal reflux disease with esophagitis, without bleeding: Secondary | ICD-10-CM | POA: Diagnosis not present

## 2023-07-05 DIAGNOSIS — K2100 Gastro-esophageal reflux disease with esophagitis, without bleeding: Secondary | ICD-10-CM | POA: Diagnosis not present

## 2023-07-05 DIAGNOSIS — E876 Hypokalemia: Secondary | ICD-10-CM | POA: Diagnosis not present

## 2023-07-05 DIAGNOSIS — C7951 Secondary malignant neoplasm of bone: Secondary | ICD-10-CM | POA: Diagnosis not present

## 2023-07-05 DIAGNOSIS — C50912 Malignant neoplasm of unspecified site of left female breast: Secondary | ICD-10-CM | POA: Diagnosis not present

## 2023-07-05 DIAGNOSIS — E039 Hypothyroidism, unspecified: Secondary | ICD-10-CM | POA: Diagnosis not present

## 2023-07-08 DIAGNOSIS — C50912 Malignant neoplasm of unspecified site of left female breast: Secondary | ICD-10-CM | POA: Diagnosis not present

## 2023-07-08 DIAGNOSIS — E039 Hypothyroidism, unspecified: Secondary | ICD-10-CM | POA: Diagnosis not present

## 2023-07-08 DIAGNOSIS — C7951 Secondary malignant neoplasm of bone: Secondary | ICD-10-CM | POA: Diagnosis not present

## 2023-07-08 DIAGNOSIS — E876 Hypokalemia: Secondary | ICD-10-CM | POA: Diagnosis not present

## 2023-07-08 DIAGNOSIS — K2100 Gastro-esophageal reflux disease with esophagitis, without bleeding: Secondary | ICD-10-CM | POA: Diagnosis not present

## 2023-07-09 DIAGNOSIS — E039 Hypothyroidism, unspecified: Secondary | ICD-10-CM | POA: Diagnosis not present

## 2023-07-09 DIAGNOSIS — E876 Hypokalemia: Secondary | ICD-10-CM | POA: Diagnosis not present

## 2023-07-09 DIAGNOSIS — K2100 Gastro-esophageal reflux disease with esophagitis, without bleeding: Secondary | ICD-10-CM | POA: Diagnosis not present

## 2023-07-09 DIAGNOSIS — C7951 Secondary malignant neoplasm of bone: Secondary | ICD-10-CM | POA: Diagnosis not present

## 2023-07-09 DIAGNOSIS — C50912 Malignant neoplasm of unspecified site of left female breast: Secondary | ICD-10-CM | POA: Diagnosis not present

## 2023-07-11 DIAGNOSIS — K2100 Gastro-esophageal reflux disease with esophagitis, without bleeding: Secondary | ICD-10-CM | POA: Diagnosis not present

## 2023-07-11 DIAGNOSIS — E876 Hypokalemia: Secondary | ICD-10-CM | POA: Diagnosis not present

## 2023-07-11 DIAGNOSIS — E039 Hypothyroidism, unspecified: Secondary | ICD-10-CM | POA: Diagnosis not present

## 2023-07-11 DIAGNOSIS — C50912 Malignant neoplasm of unspecified site of left female breast: Secondary | ICD-10-CM | POA: Diagnosis not present

## 2023-07-11 DIAGNOSIS — C7951 Secondary malignant neoplasm of bone: Secondary | ICD-10-CM | POA: Diagnosis not present

## 2023-07-12 ENCOUNTER — Other Ambulatory Visit: Payer: Self-pay | Admitting: Oncology

## 2023-07-12 DIAGNOSIS — E876 Hypokalemia: Secondary | ICD-10-CM | POA: Diagnosis not present

## 2023-07-12 DIAGNOSIS — K2100 Gastro-esophageal reflux disease with esophagitis, without bleeding: Secondary | ICD-10-CM | POA: Diagnosis not present

## 2023-07-12 DIAGNOSIS — E039 Hypothyroidism, unspecified: Secondary | ICD-10-CM | POA: Diagnosis not present

## 2023-07-12 DIAGNOSIS — C50912 Malignant neoplasm of unspecified site of left female breast: Secondary | ICD-10-CM | POA: Diagnosis not present

## 2023-07-12 DIAGNOSIS — C7951 Secondary malignant neoplasm of bone: Secondary | ICD-10-CM | POA: Diagnosis not present

## 2023-07-15 DIAGNOSIS — C50912 Malignant neoplasm of unspecified site of left female breast: Secondary | ICD-10-CM | POA: Diagnosis not present

## 2023-07-15 DIAGNOSIS — C7951 Secondary malignant neoplasm of bone: Secondary | ICD-10-CM | POA: Diagnosis not present

## 2023-07-15 DIAGNOSIS — K2100 Gastro-esophageal reflux disease with esophagitis, without bleeding: Secondary | ICD-10-CM | POA: Diagnosis not present

## 2023-07-15 DIAGNOSIS — E876 Hypokalemia: Secondary | ICD-10-CM | POA: Diagnosis not present

## 2023-07-15 DIAGNOSIS — E039 Hypothyroidism, unspecified: Secondary | ICD-10-CM | POA: Diagnosis not present

## 2023-07-16 DIAGNOSIS — C50912 Malignant neoplasm of unspecified site of left female breast: Secondary | ICD-10-CM | POA: Diagnosis not present

## 2023-07-16 DIAGNOSIS — K2100 Gastro-esophageal reflux disease with esophagitis, without bleeding: Secondary | ICD-10-CM | POA: Diagnosis not present

## 2023-07-16 DIAGNOSIS — C7951 Secondary malignant neoplasm of bone: Secondary | ICD-10-CM | POA: Diagnosis not present

## 2023-07-16 DIAGNOSIS — E876 Hypokalemia: Secondary | ICD-10-CM | POA: Diagnosis not present

## 2023-07-16 DIAGNOSIS — E039 Hypothyroidism, unspecified: Secondary | ICD-10-CM | POA: Diagnosis not present

## 2023-07-17 DIAGNOSIS — K2100 Gastro-esophageal reflux disease with esophagitis, without bleeding: Secondary | ICD-10-CM | POA: Diagnosis not present

## 2023-07-17 DIAGNOSIS — E039 Hypothyroidism, unspecified: Secondary | ICD-10-CM | POA: Diagnosis not present

## 2023-07-17 DIAGNOSIS — C7951 Secondary malignant neoplasm of bone: Secondary | ICD-10-CM | POA: Diagnosis not present

## 2023-07-17 DIAGNOSIS — C50912 Malignant neoplasm of unspecified site of left female breast: Secondary | ICD-10-CM | POA: Diagnosis not present

## 2023-07-17 DIAGNOSIS — E876 Hypokalemia: Secondary | ICD-10-CM | POA: Diagnosis not present

## 2023-07-18 DIAGNOSIS — C7951 Secondary malignant neoplasm of bone: Secondary | ICD-10-CM | POA: Diagnosis not present

## 2023-07-18 DIAGNOSIS — E876 Hypokalemia: Secondary | ICD-10-CM | POA: Diagnosis not present

## 2023-07-18 DIAGNOSIS — K2100 Gastro-esophageal reflux disease with esophagitis, without bleeding: Secondary | ICD-10-CM | POA: Diagnosis not present

## 2023-07-18 DIAGNOSIS — E039 Hypothyroidism, unspecified: Secondary | ICD-10-CM | POA: Diagnosis not present

## 2023-07-18 DIAGNOSIS — C50912 Malignant neoplasm of unspecified site of left female breast: Secondary | ICD-10-CM | POA: Diagnosis not present

## 2023-07-19 DIAGNOSIS — C7951 Secondary malignant neoplasm of bone: Secondary | ICD-10-CM | POA: Diagnosis not present

## 2023-07-19 DIAGNOSIS — E039 Hypothyroidism, unspecified: Secondary | ICD-10-CM | POA: Diagnosis not present

## 2023-07-19 DIAGNOSIS — C50912 Malignant neoplasm of unspecified site of left female breast: Secondary | ICD-10-CM | POA: Diagnosis not present

## 2023-07-19 DIAGNOSIS — K2100 Gastro-esophageal reflux disease with esophagitis, without bleeding: Secondary | ICD-10-CM | POA: Diagnosis not present

## 2023-07-19 DIAGNOSIS — E876 Hypokalemia: Secondary | ICD-10-CM | POA: Diagnosis not present

## 2023-07-23 ENCOUNTER — Telehealth: Payer: Self-pay | Admitting: *Deleted

## 2023-07-23 NOTE — Telephone Encounter (Signed)
 I called back to April breed at [employer] and daughter Finigan is going to be the one to approve the death certificate.  He is good with that

## 2023-08-16 DEATH — deceased
